# Patient Record
Sex: Male | Born: 1945 | Race: White | Hispanic: No | Marital: Married | Smoking: Former smoker
Health system: Southern US, Community
[De-identification: ages and names within clinical notes are randomized; demographics above are authoritative.]

## PROBLEM LIST (undated history)

## (undated) DIAGNOSIS — K649 Unspecified hemorrhoids: Secondary | ICD-10-CM

## (undated) DIAGNOSIS — K5790 Diverticulosis of intestine, part unspecified, without perforation or abscess without bleeding: Secondary | ICD-10-CM

## (undated) DIAGNOSIS — M7750 Other enthesopathy of unspecified foot: Secondary | ICD-10-CM

## (undated) DIAGNOSIS — Z955 Presence of coronary angioplasty implant and graft: Secondary | ICD-10-CM

## (undated) DIAGNOSIS — I519 Heart disease, unspecified: Secondary | ICD-10-CM

## (undated) DIAGNOSIS — N3289 Other specified disorders of bladder: Secondary | ICD-10-CM

## (undated) DIAGNOSIS — I10 Essential (primary) hypertension: Secondary | ICD-10-CM

## (undated) DIAGNOSIS — I38 Endocarditis, valve unspecified: Secondary | ICD-10-CM

## (undated) DIAGNOSIS — K219 Gastro-esophageal reflux disease without esophagitis: Secondary | ICD-10-CM

## (undated) DIAGNOSIS — E119 Type 2 diabetes mellitus without complications: Secondary | ICD-10-CM

## (undated) DIAGNOSIS — H269 Unspecified cataract: Secondary | ICD-10-CM

## (undated) DIAGNOSIS — K2970 Gastritis, unspecified, without bleeding: Secondary | ICD-10-CM

## (undated) DIAGNOSIS — I2510 Atherosclerotic heart disease of native coronary artery without angina pectoris: Secondary | ICD-10-CM

## (undated) DIAGNOSIS — N400 Enlarged prostate without lower urinary tract symptoms: Secondary | ICD-10-CM

## (undated) DIAGNOSIS — R519 Headache, unspecified: Secondary | ICD-10-CM

## (undated) DIAGNOSIS — F32A Depression, unspecified: Secondary | ICD-10-CM

## (undated) DIAGNOSIS — E559 Vitamin D deficiency, unspecified: Secondary | ICD-10-CM

## (undated) DIAGNOSIS — K625 Hemorrhage of anus and rectum: Secondary | ICD-10-CM

## (undated) DIAGNOSIS — F419 Anxiety disorder, unspecified: Secondary | ICD-10-CM

## (undated) DIAGNOSIS — G500 Trigeminal neuralgia: Secondary | ICD-10-CM

## (undated) DIAGNOSIS — F329 Major depressive disorder, single episode, unspecified: Secondary | ICD-10-CM

## (undated) DIAGNOSIS — E7800 Pure hypercholesterolemia, unspecified: Secondary | ICD-10-CM

## (undated) DIAGNOSIS — N329 Bladder disorder, unspecified: Secondary | ICD-10-CM

## (undated) DIAGNOSIS — I6529 Occlusion and stenosis of unspecified carotid artery: Secondary | ICD-10-CM

## (undated) DIAGNOSIS — I219 Acute myocardial infarction, unspecified: Secondary | ICD-10-CM

## (undated) DIAGNOSIS — R319 Hematuria, unspecified: Secondary | ICD-10-CM

## (undated) DIAGNOSIS — H9192 Unspecified hearing loss, left ear: Secondary | ICD-10-CM

## (undated) DIAGNOSIS — I252 Old myocardial infarction: Secondary | ICD-10-CM

## (undated) DIAGNOSIS — Z8719 Personal history of other diseases of the digestive system: Secondary | ICD-10-CM

## (undated) DIAGNOSIS — R51 Headache: Secondary | ICD-10-CM

## (undated) DIAGNOSIS — R0600 Dyspnea, unspecified: Secondary | ICD-10-CM

## (undated) DIAGNOSIS — C801 Malignant (primary) neoplasm, unspecified: Secondary | ICD-10-CM

## (undated) HISTORY — DX: Benign prostatic hyperplasia without lower urinary tract symptoms: N40.0

## (undated) HISTORY — DX: Old myocardial infarction: I25.2

## (undated) HISTORY — DX: Atherosclerotic heart disease of native coronary artery without angina pectoris: I25.10

## (undated) HISTORY — PX: EYE SURGERY: SHX253

## (undated) HISTORY — DX: Hemorrhage of anus and rectum: K62.5

## (undated) HISTORY — PX: CARDIAC CATHETERIZATION: SHX172

## (undated) HISTORY — DX: Unspecified cataract: H26.9

## (undated) HISTORY — DX: Heart disease, unspecified: I51.9

## (undated) HISTORY — PX: OTHER SURGICAL HISTORY: SHX169

## (undated) HISTORY — DX: Depression, unspecified: F32.A

## (undated) HISTORY — PX: HERNIA REPAIR: SHX51

## (undated) HISTORY — DX: Type 2 diabetes mellitus without complications: E11.9

## (undated) HISTORY — DX: Presence of coronary angioplasty implant and graft: Z95.5

## (undated) HISTORY — DX: Acute myocardial infarction, unspecified: I21.9

## (undated) HISTORY — DX: Gastro-esophageal reflux disease without esophagitis: K21.9

## (undated) HISTORY — PX: CATARACT EXTRACTION, BILATERAL: SHX1313

## (undated) HISTORY — DX: Pure hypercholesterolemia, unspecified: E78.00

## (undated) HISTORY — DX: Anxiety disorder, unspecified: F41.9

## (undated) HISTORY — DX: Hematuria, unspecified: R31.9

## (undated) HISTORY — PX: CORONARY ANGIOPLASTY: SHX604

## (undated) HISTORY — PX: UMBILICAL HERNIA REPAIR: SHX196

## (undated) HISTORY — DX: Essential (primary) hypertension: I10

## (undated) HISTORY — DX: Trigeminal neuralgia: G50.0

## (undated) HISTORY — PX: APPENDECTOMY: SHX54

## (undated) HISTORY — DX: Major depressive disorder, single episode, unspecified: F32.9

## (undated) HISTORY — DX: Occlusion and stenosis of unspecified carotid artery: I65.29

## (undated) HISTORY — DX: Bladder disorder, unspecified: N32.9

---

## 2004-07-07 ENCOUNTER — Other Ambulatory Visit: Payer: Self-pay

## 2004-07-07 ENCOUNTER — Emergency Department: Payer: Self-pay | Admitting: Emergency Medicine

## 2004-07-08 ENCOUNTER — Emergency Department: Payer: Self-pay | Admitting: Emergency Medicine

## 2004-08-11 ENCOUNTER — Encounter: Payer: Self-pay | Admitting: Internal Medicine

## 2004-08-29 ENCOUNTER — Encounter: Payer: Self-pay | Admitting: Internal Medicine

## 2004-09-29 ENCOUNTER — Encounter: Payer: Self-pay | Admitting: Internal Medicine

## 2004-12-21 ENCOUNTER — Ambulatory Visit: Payer: Self-pay | Admitting: Internal Medicine

## 2005-06-03 ENCOUNTER — Ambulatory Visit: Payer: Self-pay | Admitting: Surgery

## 2007-01-17 ENCOUNTER — Ambulatory Visit: Payer: Self-pay | Admitting: Family Medicine

## 2010-11-20 ENCOUNTER — Ambulatory Visit: Payer: Self-pay | Admitting: Podiatry

## 2010-11-30 DIAGNOSIS — E559 Vitamin D deficiency, unspecified: Secondary | ICD-10-CM | POA: Insufficient documentation

## 2011-04-21 ENCOUNTER — Ambulatory Visit: Payer: Self-pay | Admitting: Internal Medicine

## 2011-06-04 DIAGNOSIS — R5383 Other fatigue: Secondary | ICD-10-CM | POA: Insufficient documentation

## 2011-07-07 ENCOUNTER — Ambulatory Visit: Payer: Self-pay | Admitting: Gastroenterology

## 2011-11-16 DIAGNOSIS — I2510 Atherosclerotic heart disease of native coronary artery without angina pectoris: Secondary | ICD-10-CM | POA: Insufficient documentation

## 2011-11-16 HISTORY — DX: Atherosclerotic heart disease of native coronary artery without angina pectoris: I25.10

## 2012-03-09 ENCOUNTER — Ambulatory Visit: Payer: Self-pay | Admitting: Internal Medicine

## 2012-04-06 DIAGNOSIS — N5089 Other specified disorders of the male genital organs: Secondary | ICD-10-CM | POA: Insufficient documentation

## 2012-04-06 DIAGNOSIS — N509 Disorder of male genital organs, unspecified: Secondary | ICD-10-CM | POA: Insufficient documentation

## 2012-05-22 ENCOUNTER — Inpatient Hospital Stay: Payer: Self-pay | Admitting: Internal Medicine

## 2012-05-22 LAB — CBC
MCH: 25.2 pg — ABNORMAL LOW (ref 26.0–34.0)
MCV: 81 fL (ref 80–100)
Platelet: 218 10*3/uL (ref 150–440)
RBC: 5.47 10*6/uL (ref 4.40–5.90)
RDW: 14.9 % — ABNORMAL HIGH (ref 11.5–14.5)

## 2012-05-22 LAB — HEMOGLOBIN A1C: Hemoglobin A1C: 6.8 % — ABNORMAL HIGH (ref 4.2–6.3)

## 2012-05-22 LAB — CK TOTAL AND CKMB (NOT AT ARMC): CK, Total: 79 U/L (ref 35–232)

## 2012-05-22 LAB — BASIC METABOLIC PANEL
Anion Gap: 7 (ref 7–16)
Calcium, Total: 8.5 mg/dL (ref 8.5–10.1)
Co2: 24 mmol/L (ref 21–32)
Creatinine: 1.21 mg/dL (ref 0.60–1.30)
EGFR (African American): >60
EGFR (Non-African Amer.): >60
Glucose: 139 mg/dL — ABNORMAL HIGH (ref 65–99)
Osmolality: 276 (ref 275–301)
Potassium: 4.2 mmol/L (ref 3.5–5.1)

## 2012-05-22 LAB — TROPONIN I: Troponin-I: <0.02 ng/mL

## 2012-05-22 LAB — RAPID INFLUENZA A&B ANTIGENS

## 2012-05-23 LAB — COMPREHENSIVE METABOLIC PANEL
Albumin: 3.6 g/dL (ref 3.4–5.0)
Alkaline Phosphatase: 57 U/L (ref 50–136)
Anion Gap: 3 — ABNORMAL LOW (ref 7–16)
BUN: 17 mg/dL (ref 7–18)
Bilirubin,Total: 0.5 mg/dL (ref 0.2–1.0)
Calcium, Total: 8.1 mg/dL — ABNORMAL LOW (ref 8.5–10.1)
Chloride: 107 mmol/L (ref 98–107)
Co2: 28 mmol/L (ref 21–32)
EGFR (African American): >60
SGOT(AST): 21 U/L (ref 15–37)
Total Protein: 7.2 g/dL (ref 6.4–8.2)

## 2012-05-23 LAB — CBC WITH DIFFERENTIAL/PLATELET
Basophil #: 0 10*3/uL (ref 0.0–0.1)
Basophil %: 0.5 %
Eosinophil #: 0.2 10*3/uL (ref 0.0–0.7)
Eosinophil %: 2.6 %
HGB: 13.5 g/dL (ref 13.0–18.0)
Lymphocyte #: 1.2 10*3/uL (ref 1.0–3.6)
Lymphocyte %: 20.5 %
MCHC: 33.9 g/dL (ref 32.0–36.0)
MCV: 80 fL (ref 80–100)
Neutrophil #: 3.8 10*3/uL (ref 1.4–6.5)
Neutrophil %: 64 %
Platelet: 179 10*3/uL (ref 150–440)
RBC: 4.98 10*6/uL (ref 4.40–5.90)
WBC: 5.9 10*3/uL (ref 3.8–10.6)

## 2012-05-23 LAB — CK TOTAL AND CKMB (NOT AT ARMC)
CK, Total: 73 U/L (ref 35–232)
CK, Total: 78 U/L (ref 35–232)
CK-MB: 0.9 ng/mL (ref 0.5–3.6)

## 2012-05-25 LAB — CULTURE, BLOOD (SINGLE)

## 2012-08-07 DIAGNOSIS — G500 Trigeminal neuralgia: Secondary | ICD-10-CM

## 2012-08-07 HISTORY — DX: Trigeminal neuralgia: G50.0

## 2012-11-09 DIAGNOSIS — Z955 Presence of coronary angioplasty implant and graft: Secondary | ICD-10-CM | POA: Insufficient documentation

## 2012-11-09 DIAGNOSIS — I252 Old myocardial infarction: Secondary | ICD-10-CM

## 2012-11-09 DIAGNOSIS — Z9889 Other specified postprocedural states: Secondary | ICD-10-CM | POA: Insufficient documentation

## 2012-11-09 DIAGNOSIS — I10 Essential (primary) hypertension: Secondary | ICD-10-CM

## 2012-11-09 DIAGNOSIS — K219 Gastro-esophageal reflux disease without esophagitis: Secondary | ICD-10-CM | POA: Insufficient documentation

## 2012-11-09 HISTORY — DX: Essential (primary) hypertension: I10

## 2012-11-09 HISTORY — DX: Old myocardial infarction: I25.2

## 2012-11-09 HISTORY — DX: Presence of coronary angioplasty implant and graft: Z95.5

## 2012-12-12 DIAGNOSIS — F329 Major depressive disorder, single episode, unspecified: Secondary | ICD-10-CM | POA: Insufficient documentation

## 2012-12-12 DIAGNOSIS — E119 Type 2 diabetes mellitus without complications: Secondary | ICD-10-CM

## 2012-12-12 DIAGNOSIS — E782 Mixed hyperlipidemia: Secondary | ICD-10-CM | POA: Insufficient documentation

## 2012-12-12 HISTORY — DX: Type 2 diabetes mellitus without complications: E11.9

## 2013-03-01 HISTORY — PX: CORONARY STENT PLACEMENT: SHX1402

## 2013-05-16 DIAGNOSIS — H269 Unspecified cataract: Secondary | ICD-10-CM | POA: Insufficient documentation

## 2013-05-16 HISTORY — DX: Unspecified cataract: H26.9

## 2013-08-20 DIAGNOSIS — R079 Chest pain, unspecified: Secondary | ICD-10-CM | POA: Insufficient documentation

## 2013-08-22 ENCOUNTER — Ambulatory Visit: Payer: Self-pay | Admitting: Internal Medicine

## 2013-08-22 LAB — CK TOTAL AND CKMB (NOT AT ARMC)
CK, Total: 331 U/L — ABNORMAL HIGH
CK-MB: 31.8 ng/mL — AB (ref 0.5–3.6)

## 2013-08-23 LAB — BASIC METABOLIC PANEL
ANION GAP: 8 (ref 7–16)
BUN: 11 mg/dL (ref 7–18)
CALCIUM: 8.7 mg/dL (ref 8.5–10.1)
CO2: 25 mmol/L (ref 21–32)
CREATININE: 1.16 mg/dL (ref 0.60–1.30)
Chloride: 107 mmol/L (ref 98–107)
EGFR (African American): >60
Glucose: 111 mg/dL — ABNORMAL HIGH (ref 65–99)
Osmolality: 279 (ref 275–301)
Potassium: 4.1 mmol/L (ref 3.5–5.1)
Sodium: 140 mmol/L (ref 136–145)

## 2013-11-30 ENCOUNTER — Ambulatory Visit: Payer: Self-pay | Admitting: Urology

## 2013-12-30 HISTORY — PX: TRANSURETHRAL RESECTION OF BLADDER TUMOR WITH GYRUS (TURBT-GYRUS): SHX6458

## 2013-12-31 ENCOUNTER — Ambulatory Visit: Payer: Self-pay | Admitting: Urology

## 2013-12-31 LAB — PROTIME-INR
INR: 1
PROTHROMBIN TIME: 13.5 s (ref 11.5–14.7)

## 2013-12-31 LAB — URINALYSIS, COMPLETE
BACTERIA: NONE SEEN
BILIRUBIN, UR: NEGATIVE
Blood: NEGATIVE
Glucose,UR: NEGATIVE mg/dL (ref 0–75)
Ketone: NEGATIVE
Leukocyte Esterase: NEGATIVE
NITRITE: NEGATIVE
PROTEIN: NEGATIVE
Ph: 5 (ref 4.5–8.0)
RBC,UR: 1 /HPF (ref 0–5)
SPECIFIC GRAVITY: 1.018 (ref 1.003–1.030)
Squamous Epithelial: NONE SEEN
WBC UR: 1 /HPF (ref 0–5)

## 2013-12-31 LAB — CBC
HCT: 35 % — ABNORMAL LOW (ref 40.0–52.0)
HGB: 10.7 g/dL — ABNORMAL LOW (ref 13.0–18.0)
MCH: 21.4 pg — ABNORMAL LOW (ref 26.0–34.0)
MCHC: 30.5 g/dL — ABNORMAL LOW (ref 32.0–36.0)
MCV: 70 fL — ABNORMAL LOW (ref 80–100)
PLATELETS: 265 10*3/uL (ref 150–440)
RBC: 4.98 10*6/uL (ref 4.40–5.90)
RDW: 19.1 % — ABNORMAL HIGH (ref 11.5–14.5)
WBC: 6.6 10*3/uL (ref 3.8–10.6)

## 2013-12-31 LAB — APTT: Activated PTT: 26.4 secs (ref 23.6–35.9)

## 2014-01-01 LAB — URINE CULTURE

## 2014-01-02 ENCOUNTER — Ambulatory Visit: Payer: Self-pay | Admitting: Urology

## 2014-02-08 DIAGNOSIS — I6523 Occlusion and stenosis of bilateral carotid arteries: Secondary | ICD-10-CM | POA: Insufficient documentation

## 2014-02-08 DIAGNOSIS — I6529 Occlusion and stenosis of unspecified carotid artery: Secondary | ICD-10-CM | POA: Insufficient documentation

## 2014-02-08 HISTORY — DX: Occlusion and stenosis of unspecified carotid artery: I65.29

## 2014-02-12 ENCOUNTER — Ambulatory Visit: Payer: Self-pay | Admitting: Ophthalmology

## 2014-02-12 LAB — POTASSIUM: POTASSIUM: 4.4 mmol/L (ref 3.5–5.1)

## 2014-02-26 ENCOUNTER — Ambulatory Visit: Payer: Self-pay | Admitting: Ophthalmology

## 2014-05-02 ENCOUNTER — Ambulatory Visit: Payer: Self-pay | Admitting: Ophthalmology

## 2014-05-14 ENCOUNTER — Ambulatory Visit: Payer: Self-pay | Admitting: Ophthalmology

## 2014-06-11 DIAGNOSIS — I10 Essential (primary) hypertension: Secondary | ICD-10-CM

## 2014-06-11 HISTORY — DX: Essential (primary) hypertension: I10

## 2014-06-21 NOTE — Discharge Summary (Signed)
PATIENT NAME:  [name], [name] MR#:  [id number] DATE OF BIRTH:  [birth year]  DATE OF ADMISSION:  05/22/2012 DATE OF DISCHARGE:  05/24/2012  DISCHARGE DIAGNOSES: 1.  Chest pain secondary to coronary artery disease, resolved. 2.  Gastroenteritis with diarrhea and body aches, likely viral syndrome. 3.  Positive blood cultures in 1 of 2 bottles, likely contaminant.   ADDITIONAL DIAGNOSES: 1.  Hyperlipidemia. 2.  Borderline diabetes. 3.  Coronary artery disease with history of myocardial infarction and stenting x 2.  4.  Gastroesophageal reflux disease. 5.  Possible sleep apnea.   DISCHARGE MEDICATIONS: 1.  Aspirin 81 mg daily. 2.  Celexa 40 mg p.o. daily. 3.  Fish oil 1 capsule p.o. daily. 4.  Neurontin 600 mg 2 capsules in the morning and 1 capsule at night. 5.  Oxybutynin 50 mg p.o. daily.  6.  Plavix 75 mg p.o. daily.  7.  Pravastatin 40 mg daily.  ALLERGIES:  INTOLERANT TO BETA BLOCKERS, DEVELOPS BRADYCARDIA ON BETA BLOCKERS.   CONSULTANTS: Cardiology consult with Dr. Clayborn Bigness and ID consult with Dr. Clayborn Bigness.   DISCHARGE INSTRUCTIONS:  The patient will follow up with Dr. Valentino Saxon, primary doctor, and also follow up with Dr. Nehemiah Massed.  HOSPITAL COURSE:  This is a 69 year old male with history of hypertension, diabetes, hyperlipidemia and coronary artery disease with stenting x 2, follows up with Dr. Nehemiah Massed, who came in because of chest pressure. The patient had chest pressure on the 24th, in the morning.  No associated factors. The patient had an episode of diarrhea with generalized body aches and myalgias. That early morning went to see his primary doctor where he was found to have chest pressure so he was sent to the Emergency Room because of his coronary artery disease. The patient's flu test was negative in the ER. The patient was admitted for chest pain. His troponins were negative. The patient had a Lexiscan stress test done. EKG showed normal sinus rhythm with right bundle  branch and left anterior fascicular block. The patient had a Lexiscan stress test done which showed apical partially reversible defect consistent with ischemia and infarct in the territory typical of  distal LAD. The patient was taken to cardiac cath by Dr. Clayborn Bigness yesterday. He had an EF of 60% with 70% stenosis of proximal LAD and 100% stenosis of mid LAD. The patient does have collaterals. The patient's stent was intact.  Dr. Clayborn Bigness recommended medical management and follow up with Dr. Nehemiah Massed. The patient is discharged home on aspirin, Plavix and Losartan.  The patient initially was started on beta blocker per chest pain protocol, but he developed bradycardia with heart rate of 40s. The patient told me that he was started on Toprol initially and was taken off in the last 6 months because of bradycardia. So we stopped the beta blocker. The patient is on aspirin, Plavix and losartan. The patient was also seen by Dr. Clayborn Bigness because 1 set of blood cultures were positive. The patient had malaise and also diarrhea and fever at home. The patient did have blood cultures which showed only 1 of 2 sets positive for gram-positive cocci.  The patient received 1 gram of vancomycin, but Dr. Clayborn Bigness has seen the patient because he is planned for cardiac cath, so we wanted ID clearance. Dr. Clayborn Bigness has seen the patient, recommended it is likely a contaminant, no further antibiotics are indicated, and it is possible that he has a viral illness. No specific therapy is needed. The patient actually was doing better  so he suggested that the patient can go to cardiac cath. The patient did have a cardiac cath which was reportedly showing old defects, nothing new, and his stent was patent.  The patient is advised to follow up with Dr. Serafina Royals. The patient may need outpatient sleep study.  The patient's final blood cultures, reported on March 25th at 5:30, is negative in 36 hours.     TIME SPENT ON DISCHARGE PREPARATION:  More than 30 minutes. ____________________________ Epifanio Lesches, MD sk:sb D: 05/25/2012 12:05:30 ET   T: 05/25/2012 12:18:01 ET        JOB#: [id number] cc: Epifanio Lesches, MD, <Dictator> Maureen P. Heber Ekalaka, MD Corey Skains, MD Epifanio Lesches MD ELECTRONICALLY SIGNED 06/14/2012 10:10

## 2014-06-21 NOTE — Consult Note (Signed)
PATIENT NAME:  [name], [name] MR#:  [id number] DATE OF BIRTH:  1945/03/06  INFECTIONS DISEASES CONSULTATION REPORT  DATE OF CONSULTATION:  05/24/2012  REFERRING PHYSICIAN:  Dr. Clayborn Bigness.  CONSULTING PHYSICIAN:  Heinz Knuckles. Savas Elvin, MD  REASON FOR CONSULT: Coagulase-negative staph in the blood.   HISTORY OF PRESENT ILLNESS: The patient is a 69 year old white male with a past history significant for diabetes and coronary artery disease who was admitted on 05/23/2011 with chest pain. The patient states that the day before, he began having some generalized malaise, with  some subjective fevers and chills. He also had myalgias. He went to work, but then returned home without starting work because he was feeling ill. He had some chest pressure as well. He went to his primary care doctor and was transferred to the Emergency Room and was subsequently admitted. His cardiac enzymes have been negative. He has remained afebrile during this hospital course. His white count has been normal. His blood cultures, however, are growing coagulase-negative staph in 1 of 4 bottles. He denies any significant respiratory symptoms. He has had some nausea, but no vomiting. No significant abdominal pain. Some diarrhea. He has not had any urinary symptoms. He was given a dose of Tamiflu and then subsequently a dose of vancomycin x 1; neither of these have continued. Overall, he is feeling better. He did have some dizziness today when standing; however, this has happened in the past to him and is not a new symptom.   ALLERGIES: LYRICA.   PAST MEDICAL HISTORY: 1.  Diabetes.  2.  Hypertension.  3.  Hypercholesterolemia.  4.  Coronary artery disease, status post MI, status post stent placement.  5.  Status post appendectomy.  6.  Possible GERD.  7.  Possible COPD.  8.  Status post umbilical hernia repair.   SOCIAL HISTORY: The patient lives with his wife. He is a prior smoker but quit 25 years ago. He does not drink.    FAMILY HISTORY: Positive for diabetes, colon cancer, renal cancer and breast cancer.   REVIEW OF SYSTEMS:  GENERAL: Positive subjective fevers, and some chills and malaise.  HEENT: No headaches. No sinus congestion. No sore throat. Occasional postnasal drip and some allergy symptoms.  NECK: No stiffness. No swollen glands.  RESPIRATORY: No cough. No shortness of breath. No sputum production.  CARDIAC: Positive chest pressure. No peripheral edema. No dyspnea on exertion. No palpitations.  GASTROINTESTINAL: Some nausea. No vomiting, no abdominal pain. Some diarrhea.  GENITOURINARY: No urinary symptoms.  MUSCULOSKELETAL: He has myalgias, but no arthralgias. No arthritis. No joint swelling.  SKIN: No rashes.  NEUROLOGIC: He has had some dizziness, but no syncope. No focal weakness.  PSYCHIATRIC: No complaints.   All other systems are negative.   PHYSICAL EXAMINATION: VITAL SIGNS: T-max of 98.8; T-current 98.1. Pulse of 60, blood pressure 131/76; 95% on room air.  GENERAL: A 69 year old white male, obese but in no acute distress.  HEENT: Normocephalic, atraumatic. Pupils equal, reactive to light. Extraocular motion intact. Sclerae, conjunctivae, and lids are without evidence for emboli or petechiae. Oropharynx shows no erythema or exudate. Teeth and gums are in fair condition.  NECK: Supple. Full range of motion. Midline trachea. No lymphadenopathy. No thyromegaly.  CHEST: Clear to auscultation bilaterally with good air movement. No focal consolidation.  HEART: Regular rate and rhythm, without murmur, rub, or gallop.  ABDOMEN: Soft, nontender, and nondistended. No hepatosplenomegaly. No hernia is noted.  EXTREMITIES: No evidence for tenosynovitis.  SKIN EXAM: No rashes. No  stigmata of endocarditis, specifically, no Janeway lesions or Osler nodes.  NEUROLOGIC: The patient is awake and interactive, moving all four extremities.  PSYCHIATRIC: Mood and affect appeared normal.     LABORATORY  DATA: BUN 17, creatinine 1.18, bicarbonate 28, anion gap of 3. LFTs are unremarkable. CPKs, MBs, and troponins are all normal.   White count of 5.9, hemoglobin 13.5, platelet count of 179, ANC of 3.8. White count on admission was 10.2.   Blood cultures are growing coagulase-negative staph in 1 set of 2 cultures. Rapid influenza testing was negative.   EKG was read as normal.   Chest x-ray from admission shows no acute infiltrates.   KUB and upright x-rays showed no evidence for intraperitoneal air.   Myocardial scan showed mild to moderately-severe apical partially-reversible defect, consistent with ischemia and infarction of the distal LAD.   IMPRESSION: A 69 year old male with a history of diabetes and coronary artery disease admitted with chest pains, malaise, and subjective fever, who had coagulase-negative staph in the blood.   RECOMMENDATIONS: 1.  He had some subjective fever, malaise, and myalgias with some diarrhea prior to admission. He has been afebrile in-house. His white count has been normal. Blood cultures were obtained on admission and are growing coagulase-negative staph in 1 of 2 sets.  2.  The coagulase-negative staph is likely a contaminant. No further antibiotics are indicated. It is possible that he has a viral illness. No specific therapy is needed. Rapid influenza testing is negative. He appears to be doing better today.  3.  It is okay to go to cardiac catheterization today from an infectious disease standpoint. This is a low-level infectious disease consult.   Thank you very much for involving me in the patient's care.       ____________________________ Heinz Knuckles. Derionna Salvador, MD meb:dm D: 05/24/2012 13:17:59 ET T: 05/24/2012 14:04:47 ET JOB#: [id number]  cc: Heinz Knuckles. Caydon Feasel, MD, <Dictator> Mady Oubre E Nysha Koplin MD ELECTRONICALLY SIGNED 05/25/2012 9:44

## 2014-06-21 NOTE — Consult Note (Signed)
Impression:     69 yo  male w/ h/o DM, CAD admitted with chest pain, malaise and subjective fever and who has CNS in blood.    He had some subjective fever, malaise and myalgias with some diarrhea prior to admission.  He has been afebrile in house and his WBC has been normal.  BCx were obtained on admission and are growing 1 of 2 sets 4 bottles positive for CNS.    The CNS is likely a contaminant.  No further antibiotics are indicated.   It is possible that he has a viral illness.  No specific therapy is needed.  Rapid Influenza testing is negative.  He appears to be doing better today.   OK to go for cardiac cath today.   Electronic Signatures: Lenard Kampf MPH, Heinz Knuckles (MD) (Signed on 26-Mar-14 13:09)  Authored   Last Updated: 26-Mar-14 13:17 by Suzana Sohail MPH, Heinz Knuckles (MD)

## 2014-06-21 NOTE — Consult Note (Signed)
PATIENT NAME:  [name], [name] MR#:  [id number] DATE OF BIRTH:  09/28/1945  CARDIOLOGY CONSULTATION REPORT  DATE OF CONSULTATION:  05/24/2012  PRIMARY PHYSICIAN:  Dr. Heber El Dara. He has seen Dr. Nehemiah Massed in the past.  INDICATION: Angina.    Referred by [facility], Dr. Ether Griffins.  HISTORY OF PRESENT ILLNESS: The patient is a 69 year old white male with a significant medical history of borderline diabetes, hypertension, obesity, hyperlipidemia, coronary artery disease status post myocardial infarctions x 2. He had a stent placement about a year ago at [facility]. He was on vacation and started having significant chest pain. Was found to have a STEMI with a total occlusion of the right. He had that area fixed with stent placement. The patient had a drug-eluting stent placed in the right coronary artery, a 4.0 x 24 Promus. He has done reasonably well, but started having significant chest discomfort, 8/10, with no radiation, no alleviating or aggravating factors. It lasted about 1 to 2 hours and then eventually resolved, as well as associated with shortness of breath and lightheadedness and dizziness. He got presyncopal in the primary physician's office, and from there he was sent to the Emergency Room. Overall the patient feels weak, tired. According to his wife he looked pale and tired and felt like he was giving out, so he was admitted for further evaluation and care. Enzymes were essentially negative. He underwent a functional study which showed anterior apical ischemia with preserved LV function, so a cardiology consultation was recommended.   REVIEW OF SYSTEMS: He has had presyncope. No blackout spells. He has had mild  nausea. Or vomiting. Denies fever, chills, sweats. No weight loss. No weight gain. No hemoptysis, hematemesis. Denies bright red blood per rectum   Denies sputum production or cough.   PAST MEDICAL HISTORY: Hypertension, hyperlipidemia, borderline diabetes, coronary artery disease,  myocardial infarction, obesity, reflux, dysphagia, possible obstructive sleep apnea, hemorrhoids, polyps.   PAST SURGICAL HISTORY: Angioplasty and stenting, colonoscopy, EGD, hernia repair, umbilical hernia repair.   FAMILY HISTORY: Diabetes, colon cancer, kidney cancer, breast cancer.   SOCIAL HISTORY: Married. He used to smoke but quit 25 years ago. Still works.   MEDICATIONS: The patient was on aspirin 81 mg a day, citalopram 40 day. fish oil, gabapentin 600 mg, 2 tablets in the morning, one capsule in the evening, oxybutynin 50 mg daily, Plavix 75 a day.   PHYSICAL EXAMINATION: VITAL SIGNS: Blood pressure 140/75, pulse of 80, respiratory rate of 16, afebrile.  HEENT: Normocephalic, atraumatic. Pupils equal, reactive to light.  NECK: Supple. No significant JVD, bruits or adenopathy.  LUNGS: Clear to auscultation and percussion. No significant wheeze, rhonchi, or rales.  HEART: Regular rate and rhythm. Positive bowel sounds. No rebound, guarding, or tenderness.  EXTREMITIES: Within normal limits.  NEUROLOGIC: Exam intact.  SKIN: Normal.   LABORATORIES: BMP: Glucose is 139. Cardiac enzymes are negative. White count of 10.2, hemoglobin of 13.8, platelet count of 218. Flu test was negative.   EKG: Normal sinus rhythm, 85, right bundle branch block, intraventricular conduction delay, nonspecific ST-T wave changes, with slight ST depression in V2 and V3; otherwise unremarkable.   CHEST X-Ray: Unremarkable.   ASSESSMENT: Angina, abnormal functional study, coronary artery disease, borderline diabetes, obstructive sleep apnea, hypertension, hyperlipidemia, history of angioplasty and stent, history of myocardial infarction.   PLAN: 1.  Proceed with cardiac cath because of abnormal functional study and chest pain. From his previous cath will try to obtain records from [facility], but from what we can tell  he had a stent placed in his right coronary artery and had significant disease in his  LAD, which was treated medically. Plan is to repeat cath to be sure that he does not have any significant disease and base further evaluation on the cath and medical therapy.  2.  He has had positive blood cultures, 1 out of 2 coag-negative staph, probably a contaminant. Will consult infectious disease to be sure that this does not represent a significant problem prior to cath. We have to be sure that he is not septic before we try proceed with invasive procedure. I suspect it will be deemed a contaminant. Will proceed with cath today and hopefully treat the patient, and if no intervention is necessary have him discharged home soon.  3.  He is now on Nystatin. It is not clear to me why. He is on fish oil. I tried to discuss further with the patient. Unless he has an allergy will probably consider adding a statin as well. The fish oil can be continued without any problems. 4.  Hypertension: Right now he is basically on no real medications. I would recommend probably a beta blocker and/or an ACE inhibitor for post-myocardial event and hypertensive control.  5.  I agree with continuing aspirin and Plavix for now since he had a drug-eluting stent.  6.  If his chest pain continues to suggest angina we will consider nitrates, long-term. 7.  Diabetes: Will probably check a hemoglobin A1c, place on diabetic diet. He may or may not need therapy.   Will base the results on studies we get for myalgias. It is not clear whether he has myalgias. He may have been on Pravachol at one time, and that may be held for now. It is not listed in his home medicines, but the patient thinks he has been on Pravachol. Will try to figure it out with pharmacy again, but in the interim if okay with infectious disease proceed with cardiac catheterization to definitively evaluate his coronary anatomy and base further evaluation on the results of his studies.      ____________________________ Loran Senters Clayborn Bigness,  MD ddc:dm D: 05/24/2012 11:09:00 ET T: 05/24/2012 11:50:06 ET JOB#: [id number]  cc: Dwayne D. Clayborn Bigness, MD, <Dictator> Yolonda Kida MD ELECTRONICALLY SIGNED 06/12/2012 7:13

## 2014-06-21 NOTE — H&P (Signed)
DATE OF BIRTH:  27-Feb-1946  DATE OF ADMISSION:  05/22/2012  PRIMARY CARE PHYSICIAN:  Dr. Heber Volga   CARDIOLOGIST:  Dr. Nehemiah Massed   HISTORY OF PRESENT ILLNESS:  The patient is a 69 year old Caucasian male with past medical history significant for history of borderline diabetes mellitus, hypertension as well as hyperlipidemia, history of coronary artery disease, status post 2 MIs, according to patient, and stent placement approximately 1 year ago, presented to the hospital with complaints of chest pains. According to patient, he was doing well up until today in the morning, when he got up and he felt somewhat feverish and chilly, felt really poorly, felt that all his muscles were aching. He also was having some pressure in his chest. The pressure was described as discomfort in the chest, 8 out of 10 in intensity, with no radiation, with no alleviating or aggravating factors. It lasted 2 hours and resolved. It was associated with some shortness of breath as well as lightheadedness and dizziness and feeling presyncopal in his primary physician's office, from where he was sent to the Emergency Room for further evaluation. Overall, the past week he has been feeling weak, according to patient's wife, he also looked white, very tired, as well as giving out, and now he is presenting with some discomfort in his chest. Hospitalist services were contacted for admission.   PAST MEDICAL HISTORY: Significant for history of hypertension, hyperlipidemia, borderline diabetes mellitus, coronary artery disease, status post MI. First one was approximately 5 or 6 years ago, and the second one approximately a year ago, when he had stent placed. History of appendectomy as well as trigeminal neuralgia. Also history of dysphagia, questionable gastroesophageal reflux disease, questionable obstructive sleep apnea. He had a sleep study approximately 30 years ago; at that time, it was negative. He had a colonoscopy as well as EGD  done in 2013, where he was noted to have hemorrhoids, and it was felt that his rectal bleeding was hemorrhoidal-related. He also has a history of colon polyps as well as umbilical hernia repair.   MEDICATIONS: According to medical records, the patient is on aspirin 81 mg p.o. daily, citalopram 40 mg p.o. daily, fish oil 1 capsule once daily, gabapentin 600 mg tablets 2 capsules in the morning and 1 capsule in the evening, oxybutynin extended-release 50 mg p.o. daily and Plavix 75 mg p.o. daily.    SURGERIES:  As above.  ALLERGIES:  None.   FAMILY HISTORY:  The patient's brother had diabetes. The patient's father had colon cancer. The patient's mother died of kidney cancer. The patient's grandmother had breast cancer.   SOCIAL HISTORY: The patient is married. He used to smoke for a long period of time; however, quit 25 years ago. No alcohol abuse.   REVIEW OF SYSTEMS: Positive for feeling feverish and chilly over the past 1 day. He has been feeling fatigued and weak for the past 1 week, having also chest pains. Also has history of cataracts. Also sinus congestion and runny nose today in the morning. He was suspected to have obstructive sleep apnea; approximately 30 years ago he had a sleep study, which was negative. Also shortness of breath intermittently, and cough with some brownish-green sputum production for the past 2 or 3 days. One-pillow orthopnea. Lower extremity edema intermittently in his ankles. Feeling presyncopal in his primary care physician's office. Also intermittent laryngitis, hoarseness and nausea today in the morning, as well as 1 episode of diarrheal stool, loose stool with some blood early in the morning.  No recurrences. No abdominal pains. He does have some discomfort in his abdomen, in his right lower quadrant, for the past 1 week. He has a history of constipation. Otherwise, denies any high fevers, weight loss or gain. EYES: Denies any blurry vision or double vision, glaucoma.   EARS, NOSE, THROAT:  Denies tinnitus, allergies, nasal bleeding, snorring  RESPIRATORY:  Denies any wheezes, asthma, COPD.  CARDIOVASCULAR: As mentioned above, he was complaining of some chest pressure. No arrhythmias or palpitations.  GASTROINTESTINAL:  Denies any vomiting, hematemesis, abdominal pain,   change in bowel habits.  GENITOURINARY: Denies any dysuria, hematuria,  incontinence.  ENDOCRINE: Denies any polydipsia, nocturia, thyroid problems, heat or cold intolerance, or thirst.  HEMATOLOGIC:  no bleeding, lymph node enlargement   SKIN:  Denies any acne, rashes, lesions or moles.  MUSCULOSKELETAL:  Denies arthritis, cramps, swelling. NEUROLOGIC:   No numbness, epilepsy or tremor.  PSYCHIATRIC:  Denies anxiety, insomnia.    PHYSICAL EXAMINATION:  VITAL SIGNS:  On arrival to the hospital, temperature was 98.4, pulse 84, respirations 18, blood pressure 138/74, saturation 94% on room air. GENERAL:  This is a well-developed, well-nourished, obese Caucasian male in no significant distress, comfortable on the stretcher.  HEENT:  His pupils are equal and reactive to light. Extraocular movements intact. No icterus or conjunctivitis. Has normal hearing. The patient's mucosa is moist.  NECK: No masses. Supple, nontender. Thyroid is not enlarged. No adenopathy. No JVD or carotid bruits .  LUNGS:  Clear to auscultation in all fields. No rales, rhonchi, wheezing No labored inspiration, increased effort, dullness to percussion or overt respiratory distress.  CARDIOVASCULAR: S1, S2 appreciated. No murmurs, galops or rubs  The patient's  rhythm was regular.  CHEST:  Nontender to palpation. EXTREMITIES: Have 1+ pedal pulses. No lower extremity edema, calf tenderness, or cyanosis  ABDOMEN:  Protuberant, moderately firm. Minimally tender in right as well as left lower quadrants. No rebound or guarding was noted. No  hepatosplenomegaly or masses were noted.  RECTAL:  Deferred.  MUSCULOSKELETAL:  Able to  move extremities. No signs of degenerative joint disease  or joint swelling, calf tenderness, cyanosis .  SKIN:  Did not reveal any rashes, lesions, erythema, nodularity or induration. It was warm and dry to palpation.  LYMPH:  No adenopathy in the cervical region.  NEUROLOGICAL: Cranial nerves grossly intact. Sensory is intact. No dysarthria or aphasia. The patient is alert and oriented to time, person and place. Cooperative. Memory is good. No significant confusion, agitation or depression noted.   LABS: BMP showed glucose of 139, otherwise unremarkable. Cardiac enzymes, first set negative. CBC: White blood cell count 10.2, hemoglobin 13.8, platelet count 218. Influenza test  is negative. EKG showed normal sinus rhythm at 85 beats per minute, normal axis, right bundle branch block, left anterior fascicular block, with ST depressions in V2 and V3. No acute ST-T changes were noted. No EKG to compare with.    RADIOLOGIC STUDIES:  Chest x-ray PA and lateral on the 24th of March 2014 revealed no acute cardiopulmonary disease.   ASSESSMENT AND PLAN: 1.  Chest pressure. Admit patient to medical floor. Start him on metoprolol and aspirin as well as nitroglycerin and heparin subcu. Check his cardiac enzymes x3. Get Myoview stress test in the morning.   2. Myalgias of unclear etiology. For now, at this point, I am concerned about possible Pravachol. However, the patient does not have this medication listed on his home medication list, but he tells me that he was  using Pravachol, so we will hold that Pravachol at this point.   3.  Diabetes mellitus. Will get hemoglobin A1c. Will continue him on 1800 ADA diet as well as sliding scale insulin.   4.  Suspected obstructive sleep apnea. The patient may need to have his sleep study repeated, as I am concerned that his tiredness and weakness may be related to obstructive sleep apnea.   5.  Hypertension. Will continue metoprolol as well as nitroglycerin while in  the hospital. His blood pressure was quite well controlled on admission.   6.  Hyperlipidemia. Will continue fish oil. Will hold statins.   TIME SPENT: 1 hour.    ____________________________ Theodoro Grist, MD rv:mr D: 05/22/2012 19:19:49 ET T: 05/22/2012 20:26:08 ET JOB#: [id number]  cc: Theodoro Grist, MD, <Dictator> Maureen P. Heber Mascotte, MD   Theodoro Grist MD ELECTRONICALLY SIGNED 06/01/2012 15:06

## 2014-06-22 NOTE — Discharge Summary (Signed)
PATIENT NAME:  [name], [name] MR#:  [id number] DATE OF BIRTH:  07/30/1945  DATE OF ADMISSION:  08/22/2013 DATE OF DISCHARGE:  08/23/2013  DISCHARGE DIAGNOSES: Angina with coronary artery disease, hypertension and hyperlipidemia.  HISTORY OF PRESENT ILLNESS: This is a 69 year old male with known coronary artery disease status post previous PCI and stent placement and myocardial infarction who has had progressive symptoms of shortness of breath, weakness and fatigue, and stable angina, [place] class III in nature, with an abnormal stress test consistent with moderate intensity and moderate reversible inferior and apical myocardial perfusion defect with high risk stress test and typical angina at peak stress on appropriate medication management including nitrates for which were not helping improve his symptoms. The patient had undergone a cardiac catheterization showing normal LV systolic function, ejection fraction of 55%, with an occluded LAD with collateralization seen in the past and a patent right coronary artery stent and a significant 90% stenosis of a mid right coronary artery lesion needing further treatment options. The patient underwent drug-eluting stent placement of the right coronary artery through the proximal and mid section without complication with some high radiation exposure due to long procedure for which the patient has had no complication at this time. The patient has been placed on appropriate medication management and is ambulating without significant symptoms and will be discharged home in good condition.   DISCHARGE MEDICATIONS: Medication management includes ticagrelor 90 mg twice per day, aspirin 81 mg each day, citalopram 40 mg each day, losartan 25 mg each day, oxybutynin 15 mg each day and pravastatin 40 mg each day.   DISCHARGE FOLLOWUP: He is to follow up in 3 to 5 days and have further evaluation and treatment options thereafter.   ____________________________ Corey Skains, MD bjk:sb D: 08/23/2013 07:35:44 ET T: 08/23/2013 10:35:04 ET JOB#: [id number]  cc: Corey Skains, MD, <Dictator> Corey Skains MD ELECTRONICALLY SIGNED 09/03/2013 13:21

## 2014-06-22 NOTE — Op Note (Signed)
PATIENT NAME:  [name], [name] MR#:  [id number] DATE OF BIRTH:  Jun 15, 1945  DATE OF PROCEDURE:  01/02/2014  PREOPERATIVE DIAGNOSIS: Bladder lesion.   POSTOPERATIVE DIAGNOSIS: Bladder lesion.  PROCEDURE PERFORMED: Cystoscopy, bladder biopsy, fulguration of lesion.   ATTENDING SURGEON: Sherlynn Stalls, MD.   ANESTHESIA: General.  ESTIMATED BLOOD LOSS: Minimal.   DRAINS: None.    COMPLICATIONS: None.   SPECIMENS: Bladder biopsy.   INDICATION: This is a 69 year old male with a history of smoking and hematuria who underwent office cystoscopy, found to have a small bladder lesion just proximal to the left ureteral orifice on the bladder neck. The lesion appeared somewhat suspicious. Therefore, he was counseled to undergo a bladder biopsy. Of note, he recently had an MI and cardiac stent placed and is on anticoagulation. Given the very small size of the lesion, I feel that this is amenable for biopsy on anticoagulation. Increased risk of bleeding was discussed extensively with the patient who agreed to proceed as planned.   PROCEDURE: The patient was correctly identified in the preoperative holding area and informed consent was obtained. He was brought to the operating suite and placed on the table in the supine position. At this time, universal timeout protocol was performed. All team members were identified. Venodyne boots were placed and he was administered IV Levaquin in the perioperative period. He was then placed under general anesthesia and repositioned lower on the bed in the dorsal lithotomy position. He was prepped and draped in standard surgical fashion. At this point in time, a rigid cystoscope was then advanced per urethra into the bladder. Of note, there was a very mild concentric bulbar urethral stricture noted by which the 22 [place] scope was able to easily pass. Upon entry into the bladder, the entire bladder was surveilled using both 30 degree and 70 degree lenses. This revealed no  additional lesions in the bladder other than the previously described lesion between the left bladder neck and left UO. It appeared very small, approximately 5 mm in size. It is unclear whether this represented a papillary bladder tumor versus some bullous edema. At this point in time, cold cup biopsy forceps were used to grasp this lesion and perform a deep biopsy. This was passed off as bladder lesion specimen. The base of the lesion was then carefully fulgurated using Bugbee electrocautery. There was minimal bleeding noted. Care was taken to avoid any injury to the UO as this was somewhat adjacent to the left ureteral orifice. There was efflux of clear urine from the UO prior to the end of the procedure. The bladder was then drained and the scope was removed. The patient was reversed from anesthesia and taken to the PACU in stable condition. There were no complications in this case.    ____________________________ Sherlynn Stalls, MD ajb:at D: 01/02/2014 17:27:10 ET T: 01/02/2014 18:51:47 ET JOB#: [id number]  cc: Sherlynn Stalls, MD, <Dictator> Sherlynn Stalls MD ELECTRONICALLY SIGNED 01/17/2014 15:18

## 2014-06-24 LAB — SURGICAL PATHOLOGY

## 2014-06-26 NOTE — Op Note (Signed)
PATIENT NAME:  [name], [name] MR#:  [id number] DATE OF BIRTH:  September 03, 1945  DATE OF PROCEDURE:  02/26/2014  LOCATION: [facility]  PREOPERATIVE DIAGNOSIS:  Nuclear sclerotic cataract of the left eye.   POSTOPERATIVE DIAGNOSIS:  Nuclear sclerotic cataract of the left eye.   OPERATIVE PROCEDURE:  Cataract extraction by phacoemulsification with implant of intraocular lens to the left eye.   SURGEON:  Birder Robson, MD  ANESTHESIA:  1. Managed anesthesia care.  2. Topical tetracaine drops followed by 2% Xylocaine jelly applied in the preoperative holding area.   COMPLICATIONS:  None.   TECHNIQUE:   Stop and chop.  DESCRIPTION OF PROCEDURE:  The patient was examined and consented in the preoperative holding area where the aforementioned topical anesthesia was applied to the left eye and then brought back to the Operating Room where the left eye was prepped and draped in the usual sterile ophthalmic fashion and a lid speculum was placed. A paracentesis was created with the side port blade and the anterior chamber was filled with viscoelastic. A near clear corneal incision was performed with the steel keratome. A continuous curvilinear capsulorrhexis was performed with a cystotome followed by the capsulorrhexis forceps. Hydrodissection and hydrodelineation were carried out with BSS on a blunt cannula. The lens was removed in a stop-and-chop technique and the remaining cortical material was removed with the irrigation-aspiration handpiece. The capsular bag was inflated with viscoelastic and the Tecnis ZCB00, 18.5-diopter lens, serial number [id number] was placed in the capsular bag without complication.   The remaining viscoelastic was removed from the eye with the irrigation-aspiration handpiece. The wounds were hydrated. The anterior chamber was flushed with Miostat and the eye was inflated to physiologic pressure. 0.1 mL of cefuroxime concentration 10 mg/mL was placed in the anterior chamber. The wounds  were found to be water tight. The eye was dressed with Vigamox.   The patient was given protective glasses to wear throughout the day and a shield with which to sleep tonight. The patient was also given drops with which to begin a drop regimen today and will follow-up with me in 1 day.    ____________________________ Livingston Diones. Demarkus Remmel, MD wlp:MT D: 02/26/2014 13:53:53 ET T: 02/26/2014 14:44:03 ET JOB#: [id number]  cc: Vinton Layson L. Teron Blais, MD, <Dictator> Livingston Diones Sharnell Knight MD ELECTRONICALLY SIGNED 03/04/2014 9:19

## 2014-06-30 NOTE — Op Note (Signed)
PATIENT NAME:  [name], [name] MR#:  [id number] DATE OF BIRTH:  October 12, 1945  DATE OF PROCEDURE:  05/14/2014  PREOPERATIVE DIAGNOSIS:  Nuclear sclerotic cataract of the right eye.   POSTOPERATIVE DIAGNOSIS:  Nuclear sclerotic cataract of the right eye.   OPERATIVE PROCEDURE:  Cataract extraction by phacoemulsification with implant of intraocular lens to right eye.   SURGEON:  Birder Robson, MD.   ANESTHESIA:  1. Managed anesthesia care.  2. Topical tetracaine drops followed by 2% Xylocaine jelly applied in the preoperative holding area.   COMPLICATIONS:  None.   TECHNIQUE:   Stop and chop.  DESCRIPTION OF PROCEDURE:  The patient was examined and consented in the preoperative holding area where the aforementioned topical anesthesia was applied to the right eye and then brought back to the operating room where the right eye was prepped and draped in the usual sterile ophthalmic fashion and a lid speculum was placed. A paracentesis was created with the side port blade and the anterior chamber was filled with viscoelastic. A near clear corneal incision was performed with the steel keratome. A continuous curvilinear capsulorrhexis was performed with a cystotome followed by the capsulorrhexis forceps. Hydrodissection and hydrodelineation were carried out with BSS on a blunt cannula. The lens was removed in a stop and chop technique and the remaining cortical material was removed with the irrigation-aspiration handpiece. The capsular bag was inflated with viscoelastic and the Tecnis ZCB00 18.5-diopter lens, serial number [id number] was placed in the capsular bag without complication. The remaining viscoelastic was removed from the eye with the irrigation-aspiration handpiece. The wounds were hydrated. The anterior chamber was flushed with Miostat and the eye was inflated to physiologic pressure. 0.1 mL of cefuroxime concentration 10 mg/mL was placed in the anterior chamber. The wounds were found to be water  tight. The eye was dressed with Vigamox. The patient was given protective glasses to wear throughout the day and a shield with which to sleep tonight. The patient was also given drops with which to begin a drop regimen today and will follow-up with me in one day.    ____________________________ Livingston Diones. Jazalynn Mireles, MD wlp:bm D: 05/14/2014 21:44:25 ET T: 05/15/2014 06:16:11 ET JOB#: [id number]  cc: Arnetra Terris L. Ibtisam Benge, MD, <Dictator> Livingston Diones Madalina Rosman MD ELECTRONICALLY SIGNED 05/15/2014 11:00

## 2014-07-31 ENCOUNTER — Ambulatory Visit (INDEPENDENT_AMBULATORY_CARE_PROVIDER_SITE_OTHER): Payer: PPO | Admitting: Urology

## 2014-07-31 ENCOUNTER — Encounter: Payer: Self-pay | Admitting: Urology

## 2014-07-31 VITALS — BP 150/79 | HR 60 | Ht 67.0 in | Wt 205.3 lb

## 2014-07-31 DIAGNOSIS — N400 Enlarged prostate without lower urinary tract symptoms: Secondary | ICD-10-CM

## 2014-07-31 DIAGNOSIS — Z8551 Personal history of malignant neoplasm of bladder: Secondary | ICD-10-CM | POA: Diagnosis not present

## 2014-07-31 DIAGNOSIS — R310 Gross hematuria: Secondary | ICD-10-CM

## 2014-07-31 HISTORY — DX: Benign prostatic hyperplasia without lower urinary tract symptoms: N40.0

## 2014-07-31 LAB — MICROSCOPIC EXAMINATION

## 2014-07-31 MED ORDER — CIPROFLOXACIN HCL 500 MG PO TABS: 500.0000 mg | ORAL_TABLET | Freq: Once | ORAL | Status: DC

## 2014-07-31 MED ORDER — LIDOCAINE HCL 2 % EX GEL: 1.0000 "application " | Freq: Once | CUTANEOUS | Status: DC

## 2014-07-31 NOTE — Progress Notes (Signed)
07/31/2014 4:26 PM   [name] [birth year] [id number]  Referring provider: Ricardo Jericho, NP [address] [place], [place] [address]  Chief Complaint  Patient presents with  . Procedure    cystoscopy  . Bladder Cancer    HPI: 69 yo M with history of painless gross hematuria s/p hematuria work up revealing a very small papillary bladder tumor just proximal to the LEFT UO. 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 He returns today for his routine 3 month surveillance cystoscopy.  No significant voiding symptoms. Denies UTIs or kidney stones.   He does have a significant history of CAD s/p recent PCI/ stent 3 months ago. He is currently on Brilinta and ASA.  CT Urogram from 11/30/13 shows no evidence of GU pathology.   PMH: Past Medical History  Diagnosis Date  . Diabetes   . Hypercholesteremia   . Hematuria   . Anxiety   . Depression   . Myocardial infarct   . Heart disease   . Rectal bleeding   . GERD (gastroesophageal reflux disease)   . Benign enlargement of prostate   . Lesion of bladder   . H/O heart artery stent     Surgical History: Past Surgical History  Procedure Laterality Date  . Cardiac catheterization    . Appendectomy    . Coronary stent placement  2015    x5    Home Medications:    Medication List       This list is accurate as of: 07/31/14  4:26 PM.  Always use your most recent med list.               aspirin 81 MG tablet  Take 81 mg by mouth daily.     citalopram 40 MG tablet  Commonly known as:  CELEXA  Take 40 mg by mouth daily.     isosorbide mononitrate 30 MG 24 hr tablet  Commonly known as:  IMDUR  Take 30 mg by mouth daily.     losartan 25 MG tablet  Commonly known as:  COZAAR  Take 25 mg by mouth daily.     metFORMIN 500 MG tablet  Commonly known as:  GLUCOPHAGE  Take 500 mg by mouth 2 (two) times daily with a  meal.     nitroGLYCERIN 0.4 MG SL tablet  Commonly known as:  NITROSTAT  Place 0.4 mg under the tongue every 5 (five) minutes as needed for chest pain.     oxybutynin 15 MG 24 hr tablet  Commonly known as:  DITROPAN XL  Take 15 mg by mouth at bedtime.     pantoprazole 40 MG tablet  Commonly known as:  PROTONIX  Take 40 mg by mouth daily.     pravastatin 40 MG tablet  Commonly known as:  PRAVACHOL  Take 40 mg by mouth daily.     ticagrelor 90 MG Tabs tablet  Commonly known as:  BRILINTA  Take 90 mg by mouth 2 (two) times daily.        Allergies:  Allergies  Allergen Reactions  . Ace Inhibitors Cough  . Lyrica [Pregabalin] Rash    Family History: Family History  Problem Relation Age of Onset  . Kidney cancer Mother     Social History:  reports that he quit smoking about 25 years ago. He does not have any smokeless tobacco history on file. He reports  that he does not drink alcohol or use illicit drugs.  Physical Exam: Filed Vitals:   07/31/14 1601  BP: 150/79  Pulse: 60    Constitutional:  Alert and oriented, No acute distress. HEENT: [place] AT, moist mucus membranes.  Trachea midline, no masses. Cardiovascular: No clubbing, cyanosis, or edema. Respiratory: Normal respiratory effort, no increased work of breathing. GI: Abdomen is soft, nontender, nondistended, no abdominal masses GU: No CVA tenderness. Circumcised phallus, scrotum unremarkable. Skin: No rashes, bruises or suspicious lesions. Neurologic: Grossly intact, no focal deficits, moving all 4 extremities. Psychiatric: Normal mood and affect.  Laboratory Data: Lab Results  Component Value Date   WBC 6.6 12/31/2013   HGB 10.7* 12/31/2013   HCT 35.0* 12/31/2013   MCV 70* 12/31/2013   PLT 265 12/31/2013    Lab Results  Component Value Date   CREATININE 1.16 08/23/2013    No results found for: PSA  No results found for: TESTOSTERONE  No results found for: HGBA1C  Urinalysis No results found  for: COLORURINE, APPEARANCEUR, LABSPEC, [place], GLUCOSEU, HGBUR, BILIRUBINUR, KETONESUR, PROTEINUR, UROBILINOGEN, NITRITE, LEUKOCYTESUR    Cystoscopy Procedure Note  Patient identification was confirmed, informed consent was obtained, and patient was prepped using Betadine solution.  Lidocaine jelly was administered per urethral meatus.    Preoperative abx where received prior to procedure.     Pre-Procedure: - Inspection reveals a normal caliber ureteral meatus.  Procedure: The flexible cystoscope was introduced without difficulty - No urethral strictures/lesions are present. - Enlarged prostate with bilobar coaptation - Normal bladder neck - Bilateral ureteral orifices identified - Bladder mucosa  reveals no ulcers, tumors, or lesions - No bladder stones - Mild/ moderate trabeculation  Retroflexion shows no remarkable median lobe.   Post-Procedure: - Patient tolerated the procedure well   Assessment & Plan:  69 year old male with history of low-grade TA bladder cancer diagnosed 12/2013 status post surveillance cystoscopy today without evidence of recurrence. -Continue every 3 month cystoscopy      Problem List Items Addressed This Visit    None    Visit Diagnoses    Personal history of malignant neoplasm of bladder    -  Primary    Relevant Medications    ciprofloxacin (CIPRO) tablet 500 mg    lidocaine (XYLOCAINE) 2 % jelly 1 application    Other Relevant Orders    Cystoscopy    Gross hematuria        Relevant Orders    Urinalysis, Complete       Return in 3 months (on 10/31/2014) for Repeat Cystoscopy.  [place] [address] [place], [place] [address] [phone]

## 2014-08-01 LAB — URINALYSIS, COMPLETE: Urobilinogen, Ur: 1 mg/dL (ref 0.2–1.0)

## 2014-11-01 ENCOUNTER — Other Ambulatory Visit: Payer: Self-pay | Admitting: Urology

## 2014-11-08 ENCOUNTER — Other Ambulatory Visit: Payer: Self-pay | Admitting: Urology

## 2014-11-08 ENCOUNTER — Ambulatory Visit (INDEPENDENT_AMBULATORY_CARE_PROVIDER_SITE_OTHER): Payer: PPO | Admitting: Urology

## 2014-11-08 ENCOUNTER — Encounter: Payer: Self-pay | Admitting: Urology

## 2014-11-08 VITALS — BP 120/69 | HR 61 | Ht 67.0 in | Wt 207.3 lb

## 2014-11-08 DIAGNOSIS — C679 Malignant neoplasm of bladder, unspecified: Secondary | ICD-10-CM

## 2014-11-08 LAB — URINALYSIS, COMPLETE
Bilirubin, UA: NEGATIVE
Glucose, UA: NEGATIVE
KETONES UA: NEGATIVE
Leukocytes, UA: NEGATIVE
NITRITE UA: NEGATIVE
PH UA: 5.5 (ref 5.0–7.5)
Protein, UA: NEGATIVE
SPEC GRAV UA: 1.025 (ref 1.005–1.030)
Urobilinogen, Ur: 0.2 mg/dL (ref 0.2–1.0)

## 2014-11-08 LAB — MICROSCOPIC EXAMINATION: Bacteria, UA: NONE SEEN

## 2014-11-08 MED ORDER — LIDOCAINE HCL 2 % EX GEL
1.0000 "application " | Freq: Once | CUTANEOUS | Status: AC
  Administered 2014-11-08: 1 via URETHRAL

## 2014-11-08 MED ORDER — CIPROFLOXACIN HCL 500 MG PO TABS
500.0000 mg | ORAL_TABLET | Freq: Once | ORAL | Status: AC
  Administered 2014-11-08: 500 mg via ORAL

## 2014-11-08 NOTE — Progress Notes (Signed)
11/08/2014 4:30 PM   [name] [birth year] [id number]  Referring provider: Ricardo Jericho, NP [address] [place], [place] [address]  Chief Complaint  Patient presents with  . 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 He denies any hematuria, he denies LUTS. His last cystoscopy 3 months ago did not show any tumors. He denies fevers/chills/sweats. He denies nausea/vomiting   PMH: Past Medical History  Diagnosis Date  . Diabetes   . Hypercholesteremia   . Hematuria   . Anxiety   . Depression   . Myocardial infarct   . Heart disease   . Rectal bleeding   . GERD (gastroesophageal reflux disease)   . Benign enlargement of prostate   . Lesion of bladder   . H/O heart artery stent   . Benign essential HTN 06/11/2014  . Carotid artery narrowing 02/08/2014  . Arteriosclerosis of coronary artery 11/16/2011    Overview:  Stent 10/2011 stent rca 2015 with collaterals to lad which is chronically occluded   . BP (high blood pressure) 11/09/2012  . 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 Acid reflux 11/09/2012  . Diabetes mellitus, type 2 12/12/2012  . Fothergill's neuralgia 08/07/2012    Overview:  [name]   . Benign prostatic hypertrophy without urinary obstruction 07/31/2014  . Bilateral cataracts 05/16/2013    Overview:  [name]    . Clinical depression 12/12/2012  . Presence of stent in coronary artery 11/09/2012    Surgical History: Past Surgical History  Procedure Laterality Date  . Cardiac catheterization    . Appendectomy    . Coronary stent placement  2015    x5    Home Medications:    Medication List       This list is accurate as of: 11/08/14  4:30 PM.  Always use your most recent med list.               aspirin 81 MG tablet  Take 81 mg by mouth daily.     citalopram 40 MG tablet  Commonly known as:  CELEXA  Take 40 mg by mouth daily.     clopidogrel  75 MG tablet  Commonly known as:  PLAVIX  Take by mouth.     isosorbide mononitrate 30 MG 24 hr tablet  Commonly known as:  IMDUR  Take 30 mg by mouth daily.     losartan 25 MG tablet  Commonly known as:  COZAAR  Take 25 mg by mouth daily.     metFORMIN 500 MG tablet  Commonly known as:  GLUCOPHAGE  Take 500 mg by mouth 2 (two) times daily with a meal.     nitroGLYCERIN 0.4 MG SL tablet  Commonly known as:  NITROSTAT  Place 0.4 mg under the tongue every 5 (five) minutes as needed for chest pain.     Omega-3 1000 MG Caps  Take 1 g by mouth.     oxybutynin 15 MG 24 hr tablet  Commonly known as:  DITROPAN XL  Take 15 mg by mouth [name] bedtime.     pantoprazole 40 MG tablet  Commonly known as:  PROTONIX  Take 40 mg by mouth daily.     pravastatin 40 MG tablet  Commonly known as:  PRAVACHOL  Take 40 mg by mouth daily.     RA BLOOD GLUCOSE MONITOR Devi  by [name].     ticagrelor 90 MG Tabs tablet  Commonly known as:  BRILINTA  Take 90 mg by mouth 2 (two) times daily.     TRILEPTAL 150 MG tablet  Generic drug:  OXcarbazepine  1 qhs x 7 days; then 1 bid for seven days; then 1 qam and 2 qhs for seven days; then 2 bid     VITAMIN D-1000 MAX ST 1000 UNITS tablet  Generic drug:  Cholecalciferol  Take by mouth.        Allergies:  Allergies  Allergen Reactions  . Ace Inhibitors Cough  . Lyrica [Pregabalin] Rash    Family History: Family History  Problem Relation Age of Onset  . Kidney cancer Mother     Social History:  reports that he quit smoking about 25 years ago. He [name] not have any smokeless tobacco history on file. He reports that he [name] not drink alcohol or use illicit drugs.  ROS: UROLOGY Frequent Urination?: No Hard to postpone urination?: No Burning/pain with urination?: No Get up [name] night to urinate?: No Leakage of urine?: No Urine stream starts and stops?: No Trouble starting stream?: No Do you have to strain to urinate?:  No Blood in urine?: No Urinary tract infection?: No Sexually transmitted disease?: No Injury to kidneys or bladder?: No Painful intercourse?: No Weak stream?: No Erection problems?: No Penile pain?: No  Gastrointestinal Nausea?: No Vomiting?: No Indigestion/heartburn?: No Diarrhea?: No Constipation?: No  Constitutional Fever: No Night sweats?: No Weight loss?: No Fatigue?: No  Skin Skin rash/lesions?: No Itching?: No  Eyes Blurred vision?: No Double vision?: No  Ears/Nose/Throat Sore throat?: No Sinus problems?: No  Hematologic/Lymphatic Swollen glands?: No Easy bruising?: No  Cardiovascular Leg swelling?: No Chest pain?: No  Respiratory Cough?: No Shortness of breath?: No  Endocrine Excessive thirst?: Yes  Musculoskeletal Back pain?: No Joint pain?: No  Neurological Headaches?: No Dizziness?: No  Psychologic Depression?: No Anxiety?: No  Physical Exam: BP 120/69 mmHg  Pulse 61  Ht 5\' 7"  (1.702 m)  Wt 94.031 kg (207 lb 4.8 oz)  BMI 32.46 kg/m2  Constitutional:  Alert and oriented, No acute distress. HEENT: [name], moist mucus membranes.  Trachea midline, no masses. Cardiovascular: No clubbing, cyanosis, or edema. Respiratory: Normal respiratory effort, no increased work of breathing. GI: Abdomen is soft, nontender, nondistended, no abdominal masses GU: No CVA tenderness. Uncircumcised penis, no masses/lesions on penis, testis, scrotum Skin: No rashes, bruises or suspicious lesions. Lymph: No cervical or inguinal adenopathy. Neurologic: Grossly intact, no focal deficits, moving all 4 extremities. Psychiatric: Normal mood and affect.  Laboratory Data: Lab Results  Component Value Date   WBC 6.6 12/31/2013   HGB 10.7* 12/31/2013   HCT 35.0* 12/31/2013   MCV 70* 12/31/2013   PLT 265 12/31/2013    Lab Results  Component Value Date   CREATININE 1.16 08/23/2013    No results found for: PSA  No results found for:  TESTOSTERONE  Lab Results  Component Value Date   HGBA1C 6.8* 05/22/2012    Urinalysis    Component Value Date/Time   COLORURINE Yellow 12/31/2013 0916   APPEARANCEUR Clear 12/31/2013 0916   LABSPEC 1.018 12/31/2013 0916   PHURINE 5.0 12/31/2013 0916   GLUCOSEU Negative 12/31/2013 0916   HGBUR Negative 12/31/2013 0916   BILIRUBINUR Negative 12/31/2013 0916   KETONESUR Negative 12/31/2013 0916   PROTEINUR Negative 12/31/2013 0916   NITRITE Negative 12/31/2013 0916   LEUKOCYTESUR Negative 12/31/2013 0916  Cystoscopy Procedure Note  Patient identification was confirmed, informed consent was obtained, and patient was prepped using Betadine solution.  Lidocaine jelly was administered per urethral meatus.    Preoperative abx where received prior to procedure.     Pre-Procedure: - Inspection reveals a normal caliber ureteral meatus.  Procedure: The flexible cystoscope was introduced without difficulty - No urethral strictures/lesions are present. - Enlarged prostate bilobar hypertrophy - Elevated bladder neck - Bilateral ureteral orifices identified - Bladder mucosa  reveals no ulcers, tumors, or lesions - No bladder stones - No trabeculation  Retroflexion shows no masses/lesions, no intravesical prostatic protrusion   Post-Procedure: - Patient tolerated the procedure well    Assessment & Plan:    1. Malignant neoplasm of urinary bladder, unspecified site - cystoscopy normal today, RTC 3 months for office cystoscopy - Urinalysis, Complete - ciprofloxacin (CIPRO) tablet 500 mg; Take 1 tablet (500 mg total) by mouth once. - lidocaine (XYLOCAINE) 2 % jelly 1 application; Place 1 application into the urethra once.   Return in about 3 months (around 02/07/2015) for cystoscopy.  Cleon Gustin, [facility] [address] [place], [place] [address] [phone]

## 2014-12-31 DIAGNOSIS — I714 Abdominal aortic aneurysm, without rupture, unspecified: Secondary | ICD-10-CM | POA: Insufficient documentation

## 2015-01-22 ENCOUNTER — Encounter: Payer: Self-pay | Admitting: *Deleted

## 2015-01-22 ENCOUNTER — Observation Stay
Admission: RE | Admit: 2015-01-22 | Discharge: 2015-01-23 | Disposition: A | Discharge status: Home / Self Care | Payer: PPO | Source: Ambulatory Visit | Attending: Cardiology | Admitting: Cardiology

## 2015-01-22 ENCOUNTER — Encounter
Admission: RE | Disposition: A | Discharge status: Home / Self Care | Payer: Self-pay | Source: Ambulatory Visit | Attending: Cardiology

## 2015-01-22 DIAGNOSIS — E559 Vitamin D deficiency, unspecified: Secondary | ICD-10-CM | POA: Diagnosis not present

## 2015-01-22 DIAGNOSIS — I340 Nonrheumatic mitral (valve) insufficiency: Secondary | ICD-10-CM | POA: Insufficient documentation

## 2015-01-22 DIAGNOSIS — Z79899 Other long term (current) drug therapy: Secondary | ICD-10-CM | POA: Insufficient documentation

## 2015-01-22 DIAGNOSIS — I10 Essential (primary) hypertension: Secondary | ICD-10-CM | POA: Insufficient documentation

## 2015-01-22 DIAGNOSIS — R9439 Abnormal result of other cardiovascular function study: Secondary | ICD-10-CM | POA: Diagnosis present

## 2015-01-22 DIAGNOSIS — Z820 Family history of epilepsy and other diseases of the nervous system: Secondary | ICD-10-CM | POA: Diagnosis not present

## 2015-01-22 DIAGNOSIS — Z87891 Personal history of nicotine dependence: Secondary | ICD-10-CM | POA: Insufficient documentation

## 2015-01-22 DIAGNOSIS — Z7984 Long term (current) use of oral hypoglycemic drugs: Secondary | ICD-10-CM | POA: Insufficient documentation

## 2015-01-22 DIAGNOSIS — Z8249 Family history of ischemic heart disease and other diseases of the circulatory system: Secondary | ICD-10-CM | POA: Diagnosis not present

## 2015-01-22 DIAGNOSIS — R079 Chest pain, unspecified: Secondary | ICD-10-CM | POA: Diagnosis present

## 2015-01-22 DIAGNOSIS — H9192 Unspecified hearing loss, left ear: Secondary | ICD-10-CM | POA: Insufficient documentation

## 2015-01-22 DIAGNOSIS — Z800 Family history of malignant neoplasm of digestive organs: Secondary | ICD-10-CM | POA: Insufficient documentation

## 2015-01-22 DIAGNOSIS — I071 Rheumatic tricuspid insufficiency: Secondary | ICD-10-CM | POA: Diagnosis not present

## 2015-01-22 DIAGNOSIS — G500 Trigeminal neuralgia: Secondary | ICD-10-CM | POA: Insufficient documentation

## 2015-01-22 DIAGNOSIS — K219 Gastro-esophageal reflux disease without esophagitis: Secondary | ICD-10-CM | POA: Diagnosis not present

## 2015-01-22 DIAGNOSIS — F329 Major depressive disorder, single episode, unspecified: Secondary | ICD-10-CM | POA: Insufficient documentation

## 2015-01-22 DIAGNOSIS — I252 Old myocardial infarction: Secondary | ICD-10-CM | POA: Diagnosis not present

## 2015-01-22 DIAGNOSIS — Z8051 Family history of malignant neoplasm of kidney: Secondary | ICD-10-CM | POA: Insufficient documentation

## 2015-01-22 DIAGNOSIS — Z955 Presence of coronary angioplasty implant and graft: Secondary | ICD-10-CM | POA: Insufficient documentation

## 2015-01-22 DIAGNOSIS — Z7982 Long term (current) use of aspirin: Secondary | ICD-10-CM | POA: Diagnosis not present

## 2015-01-22 DIAGNOSIS — I25119 Atherosclerotic heart disease of native coronary artery with unspecified angina pectoris: Secondary | ICD-10-CM | POA: Diagnosis not present

## 2015-01-22 DIAGNOSIS — I200 Unstable angina: Secondary | ICD-10-CM | POA: Diagnosis present

## 2015-01-22 DIAGNOSIS — Z803 Family history of malignant neoplasm of breast: Secondary | ICD-10-CM | POA: Diagnosis not present

## 2015-01-22 DIAGNOSIS — E7800 Pure hypercholesterolemia, unspecified: Secondary | ICD-10-CM | POA: Diagnosis not present

## 2015-01-22 DIAGNOSIS — I2510 Atherosclerotic heart disease of native coronary artery without angina pectoris: Secondary | ICD-10-CM | POA: Diagnosis present

## 2015-01-22 HISTORY — PX: CARDIAC CATHETERIZATION: SHX172

## 2015-01-22 SURGERY — LEFT HEART CATH
Anesthesia: Moderate Sedation

## 2015-01-22 MED ORDER — SODIUM CHLORIDE 0.9 % IJ SOLN: 3.0000 mL | INTRAMUSCULAR | Status: DC | PRN

## 2015-01-22 MED ORDER — LOSARTAN POTASSIUM 50 MG PO TABS: 50.0000 mg | ORAL_TABLET | Freq: Every day | ORAL | Status: DC

## 2015-01-22 MED ORDER — ASPIRIN 81 MG PO CHEW
CHEWABLE_TABLET | ORAL | Status: AC
  Filled 2015-01-22: qty 3

## 2015-01-22 MED ORDER — PRAVASTATIN SODIUM 40 MG PO TABS
40.0000 mg | ORAL_TABLET | Freq: Every day | ORAL | Status: DC
  Administered 2015-01-22: 40 mg via ORAL
  Filled 2015-01-22: qty 1

## 2015-01-22 MED ORDER — FENTANYL CITRATE (PF) 100 MCG/2ML IJ SOLN
INTRAMUSCULAR | Status: AC
  Filled 2015-01-22: qty 2

## 2015-01-22 MED ORDER — MIDAZOLAM HCL 2 MG/2ML IJ SOLN
INTRAMUSCULAR | Status: DC | PRN
  Administered 2015-01-22: 1 mg via INTRAVENOUS

## 2015-01-22 MED ORDER — BIVALIRUDIN 250 MG IV SOLR
INTRAVENOUS | Status: AC
  Filled 2015-01-22: qty 250

## 2015-01-22 MED ORDER — SODIUM CHLORIDE 0.9 % IJ SOLN
3.0000 mL | Freq: Two times a day (BID) | INTRAMUSCULAR | Status: DC
  Administered 2015-01-22: 3 mL via INTRAVENOUS

## 2015-01-22 MED ORDER — IOHEXOL 300 MG/ML  SOLN
INTRAMUSCULAR | Status: DC | PRN
  Administered 2015-01-22: 35 mL via INTRA_ARTERIAL
  Administered 2015-01-22: 30 mL via INTRA_ARTERIAL
  Administered 2015-01-22: 100 mL via INTRA_ARTERIAL

## 2015-01-22 MED ORDER — MIDAZOLAM HCL 2 MG/2ML IJ SOLN
INTRAMUSCULAR | Status: DC | PRN
  Administered 2015-01-22: 0.5 mg via INTRAVENOUS

## 2015-01-22 MED ORDER — SODIUM CHLORIDE 0.9 % WEIGHT BASED INFUSION
3.0000 mL/kg/h | INTRAVENOUS | Status: AC
  Administered 2015-01-22: 3 mL/kg/h via INTRAVENOUS

## 2015-01-22 MED ORDER — METFORMIN HCL 500 MG PO TABS: 500.0000 mg | ORAL_TABLET | Freq: Every day | ORAL | Status: DC

## 2015-01-22 MED ORDER — FENTANYL CITRATE (PF) 100 MCG/2ML IJ SOLN
INTRAMUSCULAR | Status: DC | PRN
  Administered 2015-01-22: 25 ug via INTRAVENOUS

## 2015-01-22 MED ORDER — NITROGLYCERIN 5 MG/ML IV SOLN
INTRAVENOUS | Status: AC
  Filled 2015-01-22: qty 10

## 2015-01-22 MED ORDER — FENTANYL CITRATE (PF) 100 MCG/2ML IJ SOLN
INTRAMUSCULAR | Status: DC | PRN
  Administered 2015-01-22: 50 ug via INTRAVENOUS

## 2015-01-22 MED ORDER — ACETAMINOPHEN 325 MG PO TABS: 650.0000 mg | ORAL_TABLET | ORAL | Status: DC | PRN

## 2015-01-22 MED ORDER — ASPIRIN EC 325 MG PO TBEC: 325.0000 mg | DELAYED_RELEASE_TABLET | Freq: Every day | ORAL | Status: DC

## 2015-01-22 MED ORDER — CLOPIDOGREL BISULFATE 75 MG PO TABS
ORAL_TABLET | ORAL | Status: DC | PRN
  Administered 2015-01-22: 225 mg via ORAL

## 2015-01-22 MED ORDER — SODIUM CHLORIDE 0.9 % IJ SOLN: 10.0000 mL | Freq: Two times a day (BID) | INTRAMUSCULAR | Status: DC

## 2015-01-22 MED ORDER — ASPIRIN 81 MG PO CHEW
CHEWABLE_TABLET | ORAL | Status: DC | PRN
  Administered 2015-01-22: 243 mg via ORAL

## 2015-01-22 MED ORDER — ONDANSETRON HCL 4 MG/2ML IJ SOLN: 4.0000 mg | Freq: Four times a day (QID) | INTRAMUSCULAR | Status: DC | PRN

## 2015-01-22 MED ORDER — SODIUM CHLORIDE 0.9 % IV SOLN
250.0000 mg | INTRAVENOUS | Status: DC | PRN
  Administered 2015-01-22: 1.75 mg/kg/h via INTRAVENOUS

## 2015-01-22 MED ORDER — SODIUM CHLORIDE 0.9 % IV SOLN: 250.0000 mL | INTRAVENOUS | Status: DC | PRN

## 2015-01-22 MED ORDER — MIDAZOLAM HCL 2 MG/2ML IJ SOLN
INTRAMUSCULAR | Status: AC
  Filled 2015-01-22: qty 2

## 2015-01-22 MED ORDER — CLOPIDOGREL BISULFATE 75 MG PO TABS
75.0000 mg | ORAL_TABLET | Freq: Every day | ORAL | Status: DC
  Administered 2015-01-23: 75 mg via ORAL
  Filled 2015-01-22: qty 1

## 2015-01-22 MED ORDER — BIVALIRUDIN BOLUS VIA INFUSION - CUPID
INTRAVENOUS | Status: DC | PRN
  Administered 2015-01-22: 72.825 mg via INTRAVENOUS

## 2015-01-22 MED ORDER — SODIUM CHLORIDE 0.9 % IV SOLN: INTRAVENOUS | Status: DC

## 2015-01-22 MED ORDER — CLOPIDOGREL BISULFATE 75 MG PO TABS
ORAL_TABLET | ORAL | Status: AC
Start: 2015-01-22 — End: 2015-01-22
  Filled 2015-01-22: qty 3

## 2015-01-22 MED ORDER — CITALOPRAM HYDROBROMIDE 20 MG PO TABS: 40.0000 mg | ORAL_TABLET | Freq: Every day | ORAL | Status: DC

## 2015-01-22 MED ORDER — IOHEXOL 300 MG/ML  SOLN
INTRAMUSCULAR | Status: DC | PRN
  Administered 2015-01-22: 140 mL via INTRA_ARTERIAL

## 2015-01-22 MED ORDER — HEPARIN (PORCINE) IN NACL 2-0.9 UNIT/ML-% IJ SOLN
INTRAMUSCULAR | Status: AC
  Filled 2015-01-22: qty 1000

## 2015-01-22 SURGICAL SUPPLY — 16 items
BALLN TREK RX 2.5X12 (BALLOONS) ×3
BALLOON TREK RX 2.5X12 (BALLOONS) ×1 IMPLANT
CATH INFINITI 5FR ANG PIGTAIL (CATHETERS) ×3 IMPLANT
CATH INFINITI 5FR JL4 (CATHETERS) ×3 IMPLANT
CATH INFINITI JR4 5F (CATHETERS) ×3 IMPLANT
CATH VISTA GUIDE 6FR XB3.5 (CATHETERS) ×3 IMPLANT
DEVICE CLOSURE MYNXGRIP 6/7F (Vascular Products) ×3 IMPLANT
DEVICE INFLAT 30 PLUS (MISCELLANEOUS) ×3 IMPLANT
KIT MANI 3VAL PERCEP (MISCELLANEOUS) ×3 IMPLANT
NEEDLE PERC 18GX7CM (NEEDLE) ×3 IMPLANT
PACK CARDIAC CATH (CUSTOM PROCEDURE TRAY) ×3 IMPLANT
SHEATH AVANTI 6FR X 11CM (SHEATH) ×3 IMPLANT
SHEATH PINNACLE 5F 10CM (SHEATH) ×3 IMPLANT
STENT XIENCE ALPINE RX 3.25X15 (Permanent Stent) ×3 IMPLANT
WIRE ASAHI PROWATER 180CM (WIRE) ×3 IMPLANT
WIRE EMERALD 3MM-J .035X150CM (WIRE) ×3 IMPLANT

## 2015-01-22 NOTE — Progress Notes (Addendum)
Pt. admitted to 2A from specials, rm 250. Oriented to room, call bell, Ascom phones and staff. Bed in low position. Fall safety plan reviewed, yellow non-skid socks in place, bed alarm on. Wife at bedside, pt has no complaints of pain or discomfort. R femoral site WNL, no hematoma or bleeding, bilateral pedal pulses palpable +1. Tele box [id number] and verified with telemetry clerk. Full assessment to Epic. Will continue to monitor. Family to bring medication list so PTA med list can be updated.

## 2015-01-22 NOTE — Discharge Summary (Signed)
[facility] Cardiology Discharge Summary  Patient ID: [name] MRN: [id number] DOB/AGE: 02-Nov-1945 69 y.o.  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  Hospital Course: The patient was admitted to specials for cardiac cath with selective coronary angiogram after full consent, risk and benefits explained, and time out called with all approprate details voiced and discussed. The patient has had progressive canadian class 3 angina with high probability risk stress test consistent with ischemic chest pain and or anginal equivalent with coronary artery risk factors including high blood pressure and high cholesterol. The procedure was performed without complication and it revealed normal left ventricular function with ejection fraction of 55%.  It was found that the patient had severe 1 vessel coronary atherosclerosis with significant left anterior descending stenosis requiring further intervention. Therefore, the patient had a PCI and drug eluding stent placed without complication. The patient has been ambulating without further significant symptoms and has reached his maximal hospital benefit and will be discharged to home in good condition.  Cardiac rehabilitation has been discussed and recommended. Medication management of cardiovascular risk factors will be given post discharge and modified as an outpatient.   Discharge Exam: Blood pressure 159/69, pulse 50, temperature 97.9 F (36.6 C), temperature source Oral, resp. rate 17, height 5\' 7"  (1.702 m), weight 214 lb (97.07 kg), SpO2 96 %.  Constitutional: Alet oriented to person, place, and time. No distress.  HENT:  No nasal discharge.  Head: Normocephalic and atraumatic.  Eyes: Pupils are equal and round. No discharge.  Neck: Normal range of motion. Neck supple. No JVD present. No thyromegaly present.  Cardiovascular: Normal rate, regular rhythm, normal S1 S2, no gallop, no friction rub. No murmur Pulmonary/Chest: Effort normal, No stridor. No respiratory distress. no wheezes.  no rales.    Abdominal: Soft. Bowel sounds are normal.  no distension.  no tenderness. There is no rebound and no guarding.  Musculoskeletal: No edema, no cyanosis, normal pulses, no bleeding, Normal range of motion. no tenderness.  Neurological:  alert and oriented to person, place, and time. Coordination normal.  Skin: Skin is warm and dry. No rash noted. No erythema. No pallor.  Psychiatric:  normal mood and affect. behavior is normal.    Labs:   Lab Results  Component Value Date   WBC 6.6 12/31/2013   HGB 10.7* 12/31/2013   HCT 35.0* 12/31/2013   MCV 70* 12/31/2013   PLT 265 12/31/2013   No results for input(s): NA, K, CL, CO2, BUN, CREATININE, CALCIUM, PROT, BILITOT, ALKPHOS, ALT, AST, GLUCOSE in the last 168 hours.  Invalid input(s): LABALBU  EKG: NSR without evidence of new changes  FOLLOW UP IN ONE TO TWO WEEKS    Medication List    STOP taking these medications        losartan 25 MG tablet  Commonly known as:  COZAAR     nitroGLYCERIN 0.4 MG SL tablet  Commonly known as:  NITROSTAT     ticagrelor 90 MG Tabs tablet  Commonly known as:  BRILINTA     TRILEPTAL 150 MG tablet  Generic drug:  OXcarbazepine      TAKE these medications        aspirin 81 MG tablet  Take 81 mg by  mouth daily.     citalopram 40 MG tablet  Commonly known as:  CELEXA  Take 40 mg by mouth daily.     clopidogrel 75 MG tablet  Commonly known as:  PLAVIX  Take by mouth.     isosorbide mononitrate 30 MG 24 hr tablet  Commonly known as:  IMDUR  Take 30 mg by mouth daily.     metFORMIN 500 MG tablet  Commonly known  as:  GLUCOPHAGE  Take 500 mg by mouth 2 (two) times daily with a meal.     Omega-3 1000 MG Caps  Take 1 g by mouth.     oxybutynin 15 MG 24 hr tablet  Commonly known as:  DITROPAN XL  Take 15 mg by mouth at bedtime.     pantoprazole 40 MG tablet  Commonly known as:  PROTONIX  Take 40 mg by mouth daily.     pravastatin 40 MG tablet  Commonly known as:  PRAVACHOL  Take 40 mg by mouth daily.     RA BLOOD GLUCOSE MONITOR Devi  by Does not apply route.     VITAMIN D-1000 MAX ST 1000 UNITS tablet  Generic drug:  Cholecalciferol  Take by mouth.         THE PATIENT  SHALL BRING ALL MEDICATIONS TO FOLLOW UP APPOINTMENT  Signed:  Corey Skains MD, [facility] 01/22/2015, 1:17 PM

## 2015-01-22 NOTE — Care Management (Signed)
Elective cardiac cath resulting in PCI.  At present, patient is on Plavix which is not cost prohibitive

## 2015-01-22 NOTE — Progress Notes (Signed)
RN ambulated with patient around nurses station x2 (approx. 450ft). Pt tolerated well, no complaints of pain, shortness of breath, or dizziness. Encouraged patient to walk again this evening.

## 2015-01-22 NOTE — Progress Notes (Signed)
Report to Maddie.  Check right groin for bleeding or hematoma.  Patient will be on bedrest for 2 hours post sheath pull---out of bed at 11:15.  Bilateral pulses are 2's DP's.[name]

## 2015-01-23 ENCOUNTER — Encounter: Payer: Self-pay | Admitting: *Deleted

## 2015-01-23 DIAGNOSIS — I25119 Atherosclerotic heart disease of native coronary artery with unspecified angina pectoris: Secondary | ICD-10-CM | POA: Diagnosis not present

## 2015-01-23 LAB — BASIC METABOLIC PANEL
Anion gap: 7 (ref 5–15)
BUN: 13 mg/dL (ref 6–20)
CO2: 25 mmol/L (ref 22–32)
CREATININE: 0.98 mg/dL (ref 0.61–1.24)
Calcium: 9.1 mg/dL (ref 8.9–10.3)
Chloride: 106 mmol/L (ref 101–111)
GFR calc Af Amer: >60 mL/min (ref >60)
Glucose, Bld: 107 mg/dL — ABNORMAL HIGH (ref 65–99)
POTASSIUM: 4.5 mmol/L (ref 3.5–5.1)
SODIUM: 138 mmol/L (ref 135–145)

## 2015-01-23 LAB — CBC
HEMATOCRIT: 40.2 % (ref 40.0–52.0)
Hemoglobin: 13.5 g/dL (ref 13.0–18.0)
MCH: 25.7 pg — ABNORMAL LOW (ref 26.0–34.0)
MCHC: 33.4 g/dL (ref 32.0–36.0)
MCV: 76.8 fL — ABNORMAL LOW (ref 80.0–100.0)
PLATELETS: 164 10*3/uL (ref 150–440)
RBC: 5.24 MIL/uL (ref 4.40–5.90)
RDW: 31.3 % — AB (ref 11.5–14.5)
WBC: 7.5 10*3/uL (ref 3.8–10.6)

## 2015-01-23 NOTE — Progress Notes (Signed)
     [name] date of birth August 05, 1945 had a hospitalization and a procedure for cardiac standpoint and has complications and is recovering well. The patient has been started on appropriate medication management and will be able to return to work without restrictions on 01/27/2015.   Serafina Royals M.D. FACC

## 2015-01-23 NOTE — Care Management Obs Status (Signed)
[facility] NOTIFICATION   Patient Details  Name: [name] MRN: [id number] Date of Birth: [birth year]   Medicare Observation Status Notification Given:  Yes Letter given to pt. In room on unit    Beau Fanny, RN 01/23/2015, 9:15 AM

## 2015-02-07 ENCOUNTER — Other Ambulatory Visit: Payer: PPO | Admitting: Urology

## 2015-03-21 ENCOUNTER — Encounter: Payer: Self-pay | Admitting: Urology

## 2015-03-21 ENCOUNTER — Ambulatory Visit (INDEPENDENT_AMBULATORY_CARE_PROVIDER_SITE_OTHER): Payer: PPO | Admitting: Urology

## 2015-03-21 VITALS — BP 143/85 | HR 62 | Ht 67.0 in | Wt 209.7 lb

## 2015-03-21 DIAGNOSIS — C679 Malignant neoplasm of bladder, unspecified: Secondary | ICD-10-CM | POA: Diagnosis not present

## 2015-03-21 LAB — URINALYSIS, COMPLETE
BILIRUBIN UA: NEGATIVE
Glucose, UA: NEGATIVE
Ketones, UA: NEGATIVE
Leukocytes, UA: NEGATIVE
Nitrite, UA: NEGATIVE
PH UA: 5.5 (ref 5.0–7.5)
Protein, UA: NEGATIVE
RBC UA: NEGATIVE
Specific Gravity, UA: 1.02 (ref 1.005–1.030)
UUROB: 0.2 mg/dL (ref 0.2–1.0)

## 2015-03-21 LAB — MICROSCOPIC EXAMINATION
BACTERIA UA: NONE SEEN
Epithelial Cells (non renal): NONE SEEN /hpf (ref 0–10)
RBC, UA: NONE SEEN /hpf (ref 0–?)
WBC UA: NONE SEEN /HPF (ref 0–?)

## 2015-03-21 MED ORDER — LIDOCAINE HCL 2 % EX GEL
1.0000 "application " | Freq: Once | CUTANEOUS | Status: AC
  Administered 2015-03-21: 1 via URETHRAL

## 2015-03-21 MED ORDER — CIPROFLOXACIN HCL 500 MG PO TABS
500.0000 mg | ORAL_TABLET | Freq: Once | ORAL | Status: AC
  Administered 2015-03-21: 500 mg via ORAL

## 2015-03-21 NOTE — Progress Notes (Signed)
03/21/2015 3:16 PM   OZIAH BOBST Jun 02, 1945 [id number]  Referring provider: Ricardo Jericho, NP [address] [place], [place] [address]  Chief Complaint  Patient presents with  . Cysto    HPI: 70 yo M with history of painless gross hematuria s/p hematuria work up revealing a very small papillary bladder tumor just proximal to the LEFT UO. 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 He returns today for his routine 3 month surveillance cystoscopy.  No significant voiding symptoms. Denies UTIs or kidney stones.   CT Urogram from 11/30/13 shows no evidence of GU pathology.   PMH: Past Medical History  Diagnosis Date  . Diabetes ([place])   . Hypercholesteremia   . Hematuria   . Anxiety   . Depression   . Myocardial infarct ([place])   . Heart disease   . Rectal bleeding   . GERD (gastroesophageal reflux disease)   . Benign enlargement of prostate   . Lesion of bladder   . Benign essential HTN 06/11/2014  . Carotid artery narrowing 02/08/2014  . Arteriosclerosis of coronary artery 11/16/2011    Overview:  Stent 10/2011 stent rca 2015 with collaterals to lad which is chronically occluded   . BP (high blood pressure) 11/09/2012  . 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 Diabetes mellitus, type 2 ([place]) 12/12/2012  . Fothergill's neuralgia 08/07/2012    Overview:  [facility]   . Benign prostatic hypertrophy without urinary obstruction 07/31/2014  . Bilateral cataracts 05/16/2013    Overview:  [facility]. [facility]    . Presence of stent in coronary artery 11/09/2012    Surgical History: Past Surgical History  Procedure Laterality Date  . Cardiac catheterization    . Appendectomy    . Coronary stent placement  2015    x5  . Cardiac catheterization N/A 01/22/2015    Procedure: Left Heart Cath;  Surgeon: Corey Skains, MD;  Location: [facility];  Service:  Cardiovascular;  Laterality: N/A;  . Cardiac catheterization N/A 01/22/2015    Procedure: Coronary Stent Intervention;  Surgeon: Isaias Cowman, MD;  Location: [facility];  Service: Cardiovascular;  Laterality: N/A;    Home Medications:    Medication List       This list is accurate as of: 03/21/15  3:16 PM.  Always use your most recent med list.               aspirin 81 MG tablet  Take 81 mg by mouth daily.     citalopram 40 MG tablet  Commonly known as:  CELEXA  Take 40 mg by mouth daily.     clopidogrel 75 MG tablet  Commonly known as:  PLAVIX  Take by mouth.     isosorbide mononitrate 30 MG 24 hr tablet  Commonly known as:  IMDUR  Take 30 mg by mouth daily.     losartan 50 MG tablet  Commonly known as:  COZAAR  Take by mouth.     metFORMIN 500 MG tablet  Commonly known as:  GLUCOPHAGE  Take 500 mg by mouth 2 (two) times daily with a meal. Reported on 03/21/2015     nitroGLYCERIN 0.4 MG SL tablet  Commonly known as:  NITROSTAT  Place under the tongue.     Omega-3 1000 MG Caps  Take 1 g by mouth. Reported on 03/21/2015     oxybutynin 15  MG 24 hr tablet  Commonly known as:  DITROPAN XL  Take 15 mg by mouth at bedtime.     pantoprazole 40 MG tablet  Commonly known as:  PROTONIX  Take 40 mg by mouth daily.     pravastatin 40 MG tablet  Commonly known as:  PRAVACHOL  Take 40 mg by mouth daily.     RA BLOOD GLUCOSE MONITOR Devi  by Does not apply route.     TRILEPTAL 150 MG tablet  Generic drug:  OXcarbazepine  Reported on 03/21/2015     VITAMIN D-1000 MAX ST 1000 units tablet  Generic drug:  Cholecalciferol  Take by mouth.        Allergies:  Allergies  Allergen Reactions  . Ace Inhibitors Cough  . Lyrica [Pregabalin] Rash    Family History: Family History  Problem Relation Age of Onset  . Kidney cancer Mother   . Prostate cancer Neg Hx     Social History:  reports that he quit smoking about 25 years ago. He does not have  any smokeless tobacco history on file. He reports that he does not drink alcohol or use illicit drugs.   Physical Exam: BP 143/85 mmHg  Pulse 62  Ht 5\' 7"  (1.702 m)  Wt 209 lb 11.2 oz (95.119 kg)  BMI 32.84 kg/m2  Constitutional:  Alert and oriented, No acute distress. HEENT: [place] AT, moist mucus membranes.  Trachea midline, no masses. Cardiovascular: No clubbing, cyanosis, or edema. Respiratory: Normal respiratory effort, no increased work of breathing. GI: Abdomen is soft, nontender, nondistended, no abdominal masses GU: No CVA tenderness.  Skin: No rashes, bruises or suspicious lesions. Neurologic: Grossly intact, no focal deficits, moving all 4 extremities. Psychiatric: Normal mood and affect.  Laboratory Data: Lab Results  Component Value Date   WBC 7.5 01/23/2015   HGB 13.5 01/23/2015   HCT 40.2 01/23/2015   MCV 76.8* 01/23/2015   PLT 164 01/23/2015    Lab Results  Component Value Date   CREATININE 0.98 01/23/2015    Lab Results  Component Value Date   HGBA1C 6.8* 05/22/2012    Urinalysis UA negative today  Cystoscopy Procedure Note  Patient identification was confirmed, informed consent was obtained, and patient was prepped using Betadine solution. Lidocaine jelly was administered per urethral meatus.   Preoperative abx where received prior to procedure.    Pre-Procedure: - Inspection reveals a normal caliber ureteral meatus.  Procedure: The flexible cystoscope was introduced without difficulty - No urethral strictures/lesions are present. - Enlarged prostate with bilobar coaptation - Normal bladder neck - Bilateral ureteral orifices identified - Bladder mucosa reveals no ulcers, tumors, or lesions - No bladder stones - Mild/ moderate trabeculation  Retroflexion shows no remarkable median lobe.   Post-Procedure: - Patient tolerated the procedure well  Assessment & Plan:  70 year old male with history of low-grade TA bladder cancer diagnosed  12/2013 status post surveillance cystoscopy today without evidence of recurrence.  1. Malignant neoplasm of urinary bladder, unspecified site [facility]) Transition to q3 month cystoscopy x 2 years - Urinalysis, Complete   Return in about 3 months (around 06/19/2015).  [name], MD  [facility] [address] [place], [place] [address] [phone]

## 2015-03-21 NOTE — Addendum Note (Signed)
Addended by: Orlene Erm on: 03/21/2015 03:45 PM   Modules accepted: Orders

## 2015-05-30 DIAGNOSIS — J019 Acute sinusitis, unspecified: Secondary | ICD-10-CM | POA: Diagnosis not present

## 2015-05-31 DIAGNOSIS — E119 Type 2 diabetes mellitus without complications: Secondary | ICD-10-CM | POA: Diagnosis not present

## 2015-06-02 ENCOUNTER — Other Ambulatory Visit: Payer: Self-pay | Admitting: Nurse Practitioner

## 2015-06-02 DIAGNOSIS — Z800 Family history of malignant neoplasm of digestive organs: Secondary | ICD-10-CM | POA: Diagnosis not present

## 2015-06-02 DIAGNOSIS — R1031 Right lower quadrant pain: Secondary | ICD-10-CM | POA: Diagnosis not present

## 2015-06-02 DIAGNOSIS — Z8719 Personal history of other diseases of the digestive system: Secondary | ICD-10-CM

## 2015-06-02 DIAGNOSIS — Z860100 Personal history of colon polyps, unspecified: Secondary | ICD-10-CM

## 2015-06-02 DIAGNOSIS — R1319 Other dysphagia: Secondary | ICD-10-CM | POA: Diagnosis not present

## 2015-06-02 DIAGNOSIS — D509 Iron deficiency anemia, unspecified: Secondary | ICD-10-CM | POA: Diagnosis not present

## 2015-06-04 ENCOUNTER — Ambulatory Visit: Admission: RE | Admit: 2015-06-04 | Payer: PPO | Source: Ambulatory Visit

## 2015-06-11 ENCOUNTER — Ambulatory Visit
Admission: RE | Admit: 2015-06-11 | Discharge: 2015-06-11 | Disposition: A | Discharge status: Home / Self Care | Payer: PPO | Source: Ambulatory Visit | Attending: Nurse Practitioner | Admitting: Nurse Practitioner

## 2015-06-11 DIAGNOSIS — Z8719 Personal history of other diseases of the digestive system: Secondary | ICD-10-CM | POA: Diagnosis not present

## 2015-06-11 DIAGNOSIS — Z86010 Personal history of colonic polyps: Secondary | ICD-10-CM | POA: Insufficient documentation

## 2015-06-11 DIAGNOSIS — R1031 Right lower quadrant pain: Secondary | ICD-10-CM | POA: Insufficient documentation

## 2015-06-11 DIAGNOSIS — I700 Atherosclerosis of aorta: Secondary | ICD-10-CM | POA: Insufficient documentation

## 2015-06-11 DIAGNOSIS — K5730 Diverticulosis of large intestine without perforation or abscess without bleeding: Secondary | ICD-10-CM | POA: Diagnosis not present

## 2015-06-11 DIAGNOSIS — Z800 Family history of malignant neoplasm of digestive organs: Secondary | ICD-10-CM | POA: Diagnosis not present

## 2015-06-11 DIAGNOSIS — D509 Iron deficiency anemia, unspecified: Secondary | ICD-10-CM | POA: Diagnosis not present

## 2015-06-11 DIAGNOSIS — K760 Fatty (change of) liver, not elsewhere classified: Secondary | ICD-10-CM | POA: Insufficient documentation

## 2015-06-11 DIAGNOSIS — I77811 Abdominal aortic ectasia: Secondary | ICD-10-CM | POA: Diagnosis not present

## 2015-06-11 MED ORDER — IOPAMIDOL (ISOVUE-300) INJECTION 61%
100.0000 mL | Freq: Once | INTRAVENOUS | Status: AC | PRN
  Administered 2015-06-11: 100 mL via INTRAVENOUS

## 2015-06-12 DIAGNOSIS — I252 Old myocardial infarction: Secondary | ICD-10-CM | POA: Diagnosis not present

## 2015-06-12 DIAGNOSIS — I2510 Atherosclerotic heart disease of native coronary artery without angina pectoris: Secondary | ICD-10-CM | POA: Diagnosis not present

## 2015-06-12 DIAGNOSIS — E782 Mixed hyperlipidemia: Secondary | ICD-10-CM | POA: Diagnosis not present

## 2015-06-12 DIAGNOSIS — N5201 Erectile dysfunction due to arterial insufficiency: Secondary | ICD-10-CM | POA: Diagnosis not present

## 2015-06-16 DIAGNOSIS — N5201 Erectile dysfunction due to arterial insufficiency: Secondary | ICD-10-CM | POA: Insufficient documentation

## 2015-06-20 ENCOUNTER — Ambulatory Visit (INDEPENDENT_AMBULATORY_CARE_PROVIDER_SITE_OTHER): Payer: PPO | Admitting: Urology

## 2015-06-20 ENCOUNTER — Encounter: Payer: Self-pay | Admitting: Urology

## 2015-06-20 VITALS — BP 147/80 | HR 66 | Ht 67.0 in | Wt 213.0 lb

## 2015-06-20 DIAGNOSIS — C679 Malignant neoplasm of bladder, unspecified: Secondary | ICD-10-CM | POA: Diagnosis not present

## 2015-06-20 DIAGNOSIS — E119 Type 2 diabetes mellitus without complications: Secondary | ICD-10-CM | POA: Diagnosis not present

## 2015-06-20 DIAGNOSIS — E7800 Pure hypercholesterolemia, unspecified: Secondary | ICD-10-CM | POA: Insufficient documentation

## 2015-06-20 DIAGNOSIS — Z794 Long term (current) use of insulin: Secondary | ICD-10-CM | POA: Diagnosis not present

## 2015-06-20 LAB — MICROSCOPIC EXAMINATION: BACTERIA UA: NONE SEEN

## 2015-06-20 LAB — URINALYSIS, COMPLETE
Bilirubin, UA: NEGATIVE
Glucose, UA: NEGATIVE
Ketones, UA: NEGATIVE
LEUKOCYTES UA: NEGATIVE
Nitrite, UA: NEGATIVE
PH UA: 6 (ref 5.0–7.5)
Specific Gravity, UA: 1.02 (ref 1.005–1.030)
Urobilinogen, Ur: 1 mg/dL (ref 0.2–1.0)

## 2015-06-20 MED ORDER — LIDOCAINE HCL 2 % EX GEL
1.0000 "application " | Freq: Once | CUTANEOUS | Status: AC
  Administered 2015-06-20: 1 via URETHRAL

## 2015-06-20 MED ORDER — CIPROFLOXACIN HCL 500 MG PO TABS
500.0000 mg | ORAL_TABLET | Freq: Once | ORAL | Status: AC
  Administered 2015-06-20: 500 mg via ORAL

## 2015-06-20 NOTE — Progress Notes (Signed)
3:56 PM  06/20/2015   [name] 1945/11/21 [id number]  Referring provider: Ricardo Jericho, NP [address] [place], [place] [address]  Chief Complaint  Patient presents with  . Cysto    HPI: 70 yo M with history of painless gross hematuria s/p hematuria work up revealing a very small papillary bladder tumor just proximal to the LEFT UO. 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 He returns today for his routine 3 month surveillance cystoscopy.  No significant voiding symptoms. Denies UTIs or kidney stones.   CT Urogram from 11/30/13 shows no evidence of GU pathology.   PMH: Past Medical History  Diagnosis Date  . Diabetes ([place])   . Hypercholesteremia   . Hematuria   . Anxiety   . Depression   . Myocardial infarct ([place])   . Heart disease   . Rectal bleeding   . GERD (gastroesophageal reflux disease)   . Benign enlargement of prostate   . Lesion of bladder   . Benign essential HTN 06/11/2014  . Carotid artery narrowing 02/08/2014  . Arteriosclerosis of coronary artery 11/16/2011    Overview:  Stent 10/2011 stent rca 2015 with collaterals to lad which is chronically occluded   . BP (high blood pressure) 11/09/2012  . 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 Diabetes mellitus, type 2 ([place]) 12/12/2012  . Fothergill's neuralgia 08/07/2012    Overview:  [facility]   . Benign prostatic hypertrophy without urinary obstruction 07/31/2014  . Bilateral cataracts 05/16/2013    Overview:  Dr. [facility]    . Presence of stent in coronary artery 11/09/2012    Surgical History: Past Surgical History  Procedure Laterality Date  . Cardiac catheterization    . Appendectomy    . Coronary stent placement  2015    x5  . Cardiac catheterization N/A 01/22/2015    Procedure: Left Heart Cath;  Surgeon: Corey Skains, MD;  Location: [facility];  Service:  Cardiovascular;  Laterality: N/A;  . Cardiac catheterization N/A 01/22/2015    Procedure: Coronary Stent Intervention;  Surgeon: Isaias Cowman, MD;  Location: [facility];  Service: Cardiovascular;  Laterality: N/A;    Home Medications:    Medication List       This list is accurate as of: 06/20/15  3:56 PM.  Always use your most recent med list.               aspirin 81 MG tablet  Take 81 mg by mouth daily.     citalopram 40 MG tablet  Commonly known as:  CELEXA  Take 40 mg by mouth daily.     clopidogrel 75 MG tablet  Commonly known as:  PLAVIX  Take by mouth.     losartan 50 MG tablet  Commonly known as:  COZAAR  TAKE 1 TABLET (50 MG TOTAL) BY MOUTH ONCE DAILY.     nitroGLYCERIN 0.4 MG SL tablet  Commonly known as:  NITROSTAT  Place under the tongue.     Omega-3 1000 MG Caps  Take 1 g by mouth. Reported on 03/21/2015     oxybutynin 15 MG 24 hr tablet  Commonly known as:  DITROPAN XL  Take 15 mg by mouth at bedtime.     pantoprazole 40 MG tablet  Commonly known as:  PROTONIX  Take 40 mg by mouth daily.     pravastatin  40 MG tablet  Commonly known as:  PRAVACHOL  Take 40 mg by mouth daily.     RA BLOOD GLUCOSE MONITOR Devi  by Does not apply route.     TRILEPTAL 150 MG tablet  Generic drug:  OXcarbazepine  Reported on 03/21/2015     VITAMIN D-1000 MAX ST 1000 units tablet  Generic drug:  Cholecalciferol  Take by mouth.        Allergies:  Allergies  Allergen Reactions  . Ace Inhibitors Cough  . Lyrica [Pregabalin] Rash    Family History: Family History  Problem Relation Age of Onset  . Kidney cancer Mother   . Prostate cancer Neg Hx     Social History:  reports that he quit smoking about 25 years ago. He does not have any smokeless tobacco history on file. He reports that he does not drink alcohol or use illicit drugs.   Physical Exam: BP 147/80 mmHg  Pulse 66  Ht 5\' 7"  (1.702 m)  Wt 213 lb (96.616 kg)  BMI 33.35 kg/m2    Constitutional:  Alert and oriented, No acute distress. HEENT: [place] AT, moist mucus membranes.  Trachea midline, no masses. Cardiovascular: No clubbing, cyanosis, or edema. Respiratory: Normal respiratory effort, no increased work of breathing. GI: Abdomen is soft, nontender, nondistended, no abdominal masses GU: No CVA tenderness.  Skin: No rashes, bruises or suspicious lesions. Neurologic: Grossly intact, no focal deficits, moving all 4 extremities. Psychiatric: Normal mood and affect.  Laboratory Data: Lab Results  Component Value Date   WBC 7.5 01/23/2015   HGB 13.5 01/23/2015   HCT 40.2 01/23/2015   MCV 76.8* 01/23/2015   PLT 164 01/23/2015    Lab Results  Component Value Date   CREATININE 0.98 01/23/2015    Lab Results  Component Value Date   HGBA1C 6.8* 05/22/2012    Urinalysis UA negative today  Cystoscopy Procedure Note  Patient identification was confirmed, informed consent was obtained, and patient was prepped using Betadine solution. Lidocaine jelly was administered per urethral meatus.   Preoperative abx where received prior to procedure.    Pre-Procedure: - Inspection reveals a normal caliber ureteral meatus.  Procedure: The flexible cystoscope was introduced without difficulty - No urethral strictures/lesions are present. - Enlarged prostate with bilobar coaptation - Normal bladder neck - Bilateral ureteral orifices identified - Bladder mucosa reveals no ulcers, tumors, or lesions - No bladder stones - Moderate trabeculation  Retroflexion shows no remarkable median lobe.   Post-Procedure: - Patient tolerated the procedure well  Assessment & Plan:  70 year old male with history of low-grade TA bladder cancer diagnosed 12/2013 status post surveillance cystoscopy today without evidence of recurrence.  1. Malignant neoplasm of urinary bladder, unspecified site [facility]) Transition to q3 month cystoscopy x 2 years - Urinalysis,  Complete   Return in about 3 months (around 09/19/2015) for cystoscopy.  [name], MD  [facility] [address] [place], [place] [address] [phone]

## 2015-08-11 DIAGNOSIS — G500 Trigeminal neuralgia: Secondary | ICD-10-CM | POA: Diagnosis not present

## 2015-08-11 DIAGNOSIS — E119 Type 2 diabetes mellitus without complications: Secondary | ICD-10-CM | POA: Diagnosis not present

## 2015-08-11 DIAGNOSIS — Z7982 Long term (current) use of aspirin: Secondary | ICD-10-CM | POA: Diagnosis not present

## 2015-09-09 DIAGNOSIS — Z961 Presence of intraocular lens: Secondary | ICD-10-CM | POA: Diagnosis not present

## 2015-09-09 DIAGNOSIS — Z955 Presence of coronary angioplasty implant and graft: Secondary | ICD-10-CM | POA: Diagnosis not present

## 2015-09-09 DIAGNOSIS — Z9841 Cataract extraction status, right eye: Secondary | ICD-10-CM | POA: Diagnosis not present

## 2015-09-09 DIAGNOSIS — Z510 Encounter for antineoplastic radiation therapy: Secondary | ICD-10-CM | POA: Diagnosis not present

## 2015-09-09 DIAGNOSIS — G500 Trigeminal neuralgia: Secondary | ICD-10-CM | POA: Diagnosis not present

## 2015-09-09 DIAGNOSIS — Z9842 Cataract extraction status, left eye: Secondary | ICD-10-CM | POA: Diagnosis not present

## 2015-09-09 DIAGNOSIS — R7303 Prediabetes: Secondary | ICD-10-CM | POA: Diagnosis not present

## 2015-09-12 ENCOUNTER — Encounter: Payer: Self-pay | Admitting: *Deleted

## 2015-09-15 ENCOUNTER — Ambulatory Visit: Payer: PPO | Admitting: Anesthesiology

## 2015-09-15 ENCOUNTER — Ambulatory Visit: Admission: RE | Admit: 2015-09-15 | Payer: PPO | Source: Ambulatory Visit | Admitting: Gastroenterology

## 2015-09-15 ENCOUNTER — Encounter: Payer: Self-pay | Admitting: Anesthesiology

## 2015-09-15 ENCOUNTER — Ambulatory Visit
Admission: RE | Admit: 2015-09-15 | Discharge: 2015-09-15 | Disposition: A | Discharge status: Home / Self Care | Payer: PPO | Source: Ambulatory Visit | Attending: Gastroenterology | Admitting: Gastroenterology

## 2015-09-15 ENCOUNTER — Encounter
Admission: RE | Disposition: A | Discharge status: Home / Self Care | Payer: Self-pay | Source: Ambulatory Visit | Attending: Gastroenterology

## 2015-09-15 ENCOUNTER — Encounter: Admission: RE | Payer: Self-pay | Source: Ambulatory Visit

## 2015-09-15 DIAGNOSIS — R1310 Dysphagia, unspecified: Secondary | ICD-10-CM | POA: Insufficient documentation

## 2015-09-15 DIAGNOSIS — Z5309 Procedure and treatment not carried out because of other contraindication: Secondary | ICD-10-CM | POA: Diagnosis not present

## 2015-09-15 HISTORY — DX: Malignant (primary) neoplasm, unspecified: C80.1

## 2015-09-15 HISTORY — DX: Headache: R51

## 2015-09-15 HISTORY — DX: Unspecified hemorrhoids: K64.9

## 2015-09-15 HISTORY — DX: Headache, unspecified: R51.9

## 2015-09-15 HISTORY — DX: Other specified disorders of bladder: N32.89

## 2015-09-15 HISTORY — DX: Unspecified hearing loss, left ear: H91.92

## 2015-09-15 HISTORY — DX: Vitamin D deficiency, unspecified: E55.9

## 2015-09-15 HISTORY — DX: Trigeminal neuralgia: G50.0

## 2015-09-15 HISTORY — DX: Gastro-esophageal reflux disease without esophagitis: K21.9

## 2015-09-15 HISTORY — DX: Endocarditis, valve unspecified: I38

## 2015-09-15 SURGERY — COLONOSCOPY WITH PROPOFOL
Anesthesia: General

## 2015-09-15 MED ORDER — SODIUM CHLORIDE 0.9 % IV SOLN
INTRAVENOUS | Status: DC
  Administered 2015-09-15: 1000 mL via INTRAVENOUS

## 2015-09-15 NOTE — Anesthesia Preprocedure Evaluation (Deleted)
Anesthesia Evaluation  Patient identified by MRN, date of birth, ID band Patient awake    Reviewed: Allergy & Precautions, H&P , NPO status , Patient's Chart, lab work & pertinent test results, reviewed documented beta blocker date and time   History of Anesthesia Complications Negative for: history of anesthetic complications  Airway Mallampati: II  TM Distance: >3 FB Neck ROM: full    Dental no notable dental hx. (+) Upper Dentures, Lower Dentures, Edentulous Upper, Edentulous Lower   Pulmonary neg shortness of breath, neg sleep apnea, neg COPD, neg recent URI, former smoker,    Pulmonary exam normal breath sounds clear to auscultation       Cardiovascular Exercise Tolerance: Good hypertension, (-) angina+ CAD, + Past MI, + Cardiac Stents and + Peripheral Vascular Disease  (-) CABG Normal cardiovascular exam(-) dysrhythmias + Valvular Problems/Murmurs  Rhythm:regular Rate:Normal     Neuro/Psych neg Seizures PSYCHIATRIC DISORDERS (Depression)  Neuromuscular disease (Trigeminal neuralgia)    GI/Hepatic Neg liver ROS, GERD  Medicated,  Endo/Other  diabetes  Renal/GU negative Renal ROS  negative genitourinary   Musculoskeletal   Abdominal   Peds  Hematology negative hematology ROS (+)   Anesthesia Other Findings Past Medical History:   Diabetes ([place])                                               Hypercholesteremia                                           Hematuria                                                    Anxiety                                                      Depression                                                   Myocardial infarct ([facility])                                     Heart disease                                                Rectal bleeding                                              GERD (gastroesophageal reflux disease)  Benign enlargement of prostate                                Lesion of bladder                                            Benign essential HTN                            06/11/2014    Carotid artery narrowing                        02/08/2014   Arteriosclerosis of coronary artery             11/16/2011      Comment:Overview:  Stent 10/2011 stent rca 2015 with               collaterals to lad which is chronically               occluded    BP (high blood pressure)                        11/09/2012    Healed myocardial infarct                       11/09/2012    Diabetes mellitus, type 2 ([place])                 12/12/2012   Fothergill's neuralgia                          08/07/2012       Comment:Overview:  [facility]    Benign prostatic hypertrophy without urinary o* 07/31/2014     Bilateral cataracts                             05/16/2013      Comment:Overview:  Dr. [facility]     Presence of stent in coronary artery            11/09/2012    Headache                                                     Detrusor hypertrophy                                         Esophageal reflux                                            Hearing loss in left ear                                     Hemorrhoids  Cancer ([place])                                                   Comment:skin   Trigeminal neuralgia                                         Valvular heart disease                                       Vitamin D deficiency                                         Reproductive/Obstetrics negative OB ROS                             Anesthesia Physical Anesthesia Plan  ASA: III  Anesthesia Plan: General   Post-op Pain Management:    Induction:   Airway Management Planned:   Additional Equipment:   Intra-op Plan:   Post-operative Plan:   Informed Consent: I have reviewed the patients History and Physical, chart, labs and discussed the procedure including the  risks, benefits and alternatives for the proposed anesthesia with the patient or authorized representative who has indicated his/her understanding and acceptance.   Dental Advisory Given  Plan Discussed with: Anesthesiologist, CRNA and Surgeon  Anesthesia Plan Comments:         Anesthesia Quick Evaluation

## 2015-09-16 NOTE — H&P (Signed)
Patient presenting for EGD and colonoscopy as outpatient.  Unfortunately he did not hold his anticoagulation medicines, Plavix and ASA, having taken both through yesterday and this am. Will rearrange for future date.

## 2015-09-19 ENCOUNTER — Ambulatory Visit: Admission: RE | Admit: 2015-09-19 | Payer: PPO | Source: Ambulatory Visit | Admitting: Gastroenterology

## 2015-09-22 ENCOUNTER — Other Ambulatory Visit: Payer: Self-pay | Admitting: Gastroenterology

## 2015-09-22 DIAGNOSIS — R1310 Dysphagia, unspecified: Secondary | ICD-10-CM

## 2015-09-24 ENCOUNTER — Other Ambulatory Visit: Payer: PPO | Admitting: Urology

## 2015-09-25 ENCOUNTER — Encounter: Payer: Self-pay | Admitting: Urology

## 2015-09-25 ENCOUNTER — Ambulatory Visit (INDEPENDENT_AMBULATORY_CARE_PROVIDER_SITE_OTHER): Payer: PPO | Admitting: Urology

## 2015-09-25 ENCOUNTER — Ambulatory Visit: Admit: 2015-09-25 | Payer: PPO | Admitting: Gastroenterology

## 2015-09-25 VITALS — BP 167/83 | HR 57 | Ht 67.0 in | Wt 212.0 lb

## 2015-09-25 DIAGNOSIS — Z8551 Personal history of malignant neoplasm of bladder: Secondary | ICD-10-CM | POA: Diagnosis not present

## 2015-09-25 DIAGNOSIS — R310 Gross hematuria: Secondary | ICD-10-CM | POA: Diagnosis not present

## 2015-09-25 LAB — URINALYSIS, COMPLETE
Bilirubin, UA: NEGATIVE
GLUCOSE, UA: NEGATIVE
Ketones, UA: NEGATIVE
Leukocytes, UA: NEGATIVE
NITRITE UA: NEGATIVE
Protein, UA: NEGATIVE
Specific Gravity, UA: 1.02 (ref 1.005–1.030)
Urobilinogen, Ur: 0.2 mg/dL (ref 0.2–1.0)
pH, UA: 6 (ref 5.0–7.5)

## 2015-09-25 LAB — MICROSCOPIC EXAMINATION
BACTERIA UA: NONE SEEN
WBC, UA: NONE SEEN /hpf (ref 0–?)

## 2015-09-25 SURGERY — COLONOSCOPY WITH PROPOFOL
Anesthesia: General

## 2015-09-25 MED ORDER — CIPROFLOXACIN HCL 500 MG PO TABS
500.0000 mg | ORAL_TABLET | Freq: Once | ORAL | Status: AC
  Administered 2015-09-25: 500 mg via ORAL

## 2015-09-25 MED ORDER — LIDOCAINE HCL 2 % EX GEL
1.0000 "application " | Freq: Once | CUTANEOUS | Status: AC
  Administered 2015-09-25: 1 via URETHRAL

## 2015-09-25 NOTE — Progress Notes (Signed)
4:00 PM  09/25/15   [name] [birth year] [id number]  Referring provider: Ricardo Jericho, NP [address] [place], [place] [address]  Chief Complaint  Patient presents with  . Cysto    HPI: 70 yo M with history of painless gross hematuria s/p hematuria work up revealing a very small papillary bladder tumor just proximal to the LEFT UO. 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 He returns today for his routine 3 month surveillance cystoscopy.  No significant voiding symptoms. Denies UTIs or kidney stones.   CT Urogram from 06/20/15 shows no evidence of GU pathology.  Last cyst negative.     PMH: Past Medical History:  Diagnosis Date  . Anxiety   . Arteriosclerosis of coronary artery 11/16/2011   Overview:  Stent 10/2011 stent rca 2015 with collaterals to lad which is chronically occluded   . Benign enlargement of prostate   . Benign essential HTN 06/11/2014  . Benign prostatic hypertrophy without urinary obstruction 07/31/2014  . Bilateral cataracts 05/16/2013   Overview:  Dr. Tobe [facility]    . BP (high blood pressure) 11/09/2012  . Cancer ([place])    skin  . Carotid artery narrowing 02/08/2014  . Depression   . Detrusor hypertrophy   . Diabetes ([place])   . Diabetes mellitus, type 2 ([place]) 12/12/2012  . Esophageal reflux   . Fothergill's neuralgia 08/07/2012   Overview:  [facility]   . GERD (gastroesophageal reflux disease)   . Headache   . 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 Hearing loss in left ear   . Heart disease   . Hematuria   . Hemorrhoids   . Hypercholesteremia   . Lesion of bladder   . Myocardial infarct ([place])   . Presence of stent in coronary artery 11/09/2012  . Rectal bleeding   . Trigeminal neuralgia   . Valvular heart disease   . Vitamin D deficiency     Surgical History: Past Surgical History:  Procedure Laterality Date  .  APPENDECTOMY    . CARDIAC CATHETERIZATION    . CARDIAC CATHETERIZATION N/A 01/22/2015   Procedure: Left Heart Cath;  Surgeon: Corey Skains, MD;  Location: [facility];  Service: Cardiovascular;  Laterality: N/A;  . CARDIAC CATHETERIZATION N/A 01/22/2015   Procedure: Coronary Stent Intervention;  Surgeon: Isaias Cowman, MD;  Location: [facility];  Service: Cardiovascular;  Laterality: N/A;  . CORONARY STENT PLACEMENT  2015   x5  . HERNIA REPAIR    . urethral meatotomy      Home Medications:    Medication List       Accurate as of 09/25/15  4:00 PM. Always use your most recent med list.          aspirin 81 MG tablet Take 81 mg by mouth daily.   citalopram 40 MG tablet Commonly known as:  CELEXA Take 40 mg by mouth daily.   clopidogrel 75 MG tablet Commonly known as:  PLAVIX Take by mouth.   losartan 50 MG tablet Commonly known as:  COZAAR TAKE 1 TABLET (50 MG TOTAL) BY MOUTH ONCE DAILY.   metFORMIN 500 MG tablet Commonly known as:  GLUCOPHAGE Take 500 mg by mouth 2 (two) times daily with a meal. Reported on 09/15/2015   nitroGLYCERIN 0.4 MG SL tablet Commonly known as:  NITROSTAT Place under the tongue.   Omega-3 1000 MG  Caps Take 1 g by mouth. Reported on 03/21/2015   oxybutynin 15 MG 24 hr tablet Commonly known as:  DITROPAN XL Take 15 mg by mouth at bedtime.   pantoprazole 40 MG tablet Commonly known as:  PROTONIX Take 40 mg by mouth daily.   pravastatin 40 MG tablet Commonly known as:  PRAVACHOL Take 40 mg by mouth daily.   RA BLOOD GLUCOSE MONITOR Devi by Does not apply route.   sildenafil 50 MG tablet Commonly known as:  VIAGRA Take 50 mg by mouth daily as needed for erectile dysfunction.   TRILEPTAL 150 MG tablet Generic drug:  OXcarbazepine Reported on 03/21/2015   VITAMIN D-1000 MAX ST 1000 units tablet Generic drug:  Cholecalciferol Take by mouth.       Allergies:  Allergies  Allergen Reactions  . Ace  Inhibitors Cough  . Lyrica [Pregabalin] Rash    Family History: Family History  Problem Relation Age of Onset  . Kidney cancer Mother   . Prostate cancer Neg Hx     Social History:  reports that he quit smoking about 26 years ago. His smoking use included Cigarettes. He has never used smokeless tobacco. He reports that he drinks alcohol. He reports that he does not use drugs.   Physical Exam: BP (!) 167/83 (BP Location: Left Arm, Patient Position: Sitting, Cuff Size: Large)   Pulse (!) 57   Ht 5\' 7"  (1.702 m)   Wt 212 lb (96.2 kg)   BMI 33.20 kg/m   Constitutional:  Alert and oriented, No acute distress. HEENT: [place] AT, moist mucus membranes.  Trachea midline, no masses. Cardiovascular: No clubbing, cyanosis, or edema. Respiratory: Normal respiratory effort, no increased work of breathing. GI: Abdomen is soft, nontender, nondistended, no abdominal masses GU: No CVA tenderness.  Normal urethral meatus.  Normal phallus.   Skin: No rashes, bruises or suspicious lesions. Neurologic: Grossly intact, no focal deficits, moving all 4 extremities. Psychiatric: Normal mood and affect.  Laboratory Data: Lab Results  Component Value Date   WBC 7.5 01/23/2015   HGB 13.5 01/23/2015   HCT 40.2 01/23/2015   MCV 76.8 (L) 01/23/2015   PLT 164 01/23/2015    Lab Results  Component Value Date   CREATININE 0.98 01/23/2015    Lab Results  Component Value Date   HGBA1C 6.8 (H) 05/22/2012    Urinalysis UA negative today  Cystoscopy Procedure Note  Patient identification was confirmed, informed consent was obtained, and patient was prepped using Betadine solution. Lidocaine jelly was administered per urethral meatus.   Preoperative abx where received prior to procedure.    Pre-Procedure: - Inspection reveals a normal caliber ureteral meatus.  Procedure: The flexible cystoscope was introduced without difficulty - No urethral strictures/lesions are present. - Enlarged prostate  with bilobar coaptation - Normal bladder neck - Bilateral ureteral orifices identified - Bladder mucosa reveals no ulcers, tumors, or lesions - No bladder stones - Moderate trabeculation  Retroflexion shows no remarkable median lobe.   Post-Procedure: - Patient tolerated the procedure well  Assessment & Plan:  70 year old male with history of low-grade TA bladder cancer diagnosed 12/2013 status post surveillance cystoscopy today without evidence of recurrence.  1. Malignant neoplasm of urinary bladder, unspecified site [facility]) Transition to q6 month cystoscopy x 2 years - Urinalysis, Complete   Return in about 6 months (around 03/27/2016) for cystoscopy.  [name], MD  [facility] [address], [place], [place] [address] [phone]

## 2015-09-26 ENCOUNTER — Ambulatory Visit
Admission: RE | Admit: 2015-09-26 | Discharge: 2015-09-26 | Disposition: A | Discharge status: Home / Self Care | Payer: PPO | Source: Ambulatory Visit | Attending: Gastroenterology | Admitting: Gastroenterology

## 2015-09-26 DIAGNOSIS — K219 Gastro-esophageal reflux disease without esophagitis: Secondary | ICD-10-CM | POA: Diagnosis not present

## 2015-09-26 DIAGNOSIS — R1310 Dysphagia, unspecified: Secondary | ICD-10-CM | POA: Insufficient documentation

## 2015-09-26 DIAGNOSIS — K449 Diaphragmatic hernia without obstruction or gangrene: Secondary | ICD-10-CM | POA: Insufficient documentation

## 2015-12-05 ENCOUNTER — Encounter: Payer: Self-pay | Admitting: *Deleted

## 2015-12-08 ENCOUNTER — Ambulatory Visit
Admission: RE | Admit: 2015-12-08 | Discharge: 2015-12-08 | Disposition: A | Discharge status: Home / Self Care | Payer: PPO | Source: Ambulatory Visit | Attending: Gastroenterology | Admitting: Gastroenterology

## 2015-12-08 ENCOUNTER — Ambulatory Visit: Payer: PPO | Admitting: Anesthesiology

## 2015-12-08 ENCOUNTER — Encounter
Admission: RE | Disposition: A | Discharge status: Home / Self Care | Payer: Self-pay | Source: Ambulatory Visit | Attending: Gastroenterology

## 2015-12-08 ENCOUNTER — Encounter: Payer: Self-pay | Admitting: *Deleted

## 2015-12-08 DIAGNOSIS — I2510 Atherosclerotic heart disease of native coronary artery without angina pectoris: Secondary | ICD-10-CM | POA: Insufficient documentation

## 2015-12-08 DIAGNOSIS — I10 Essential (primary) hypertension: Secondary | ICD-10-CM | POA: Diagnosis not present

## 2015-12-08 DIAGNOSIS — F329 Major depressive disorder, single episode, unspecified: Secondary | ICD-10-CM | POA: Insufficient documentation

## 2015-12-08 DIAGNOSIS — E7800 Pure hypercholesterolemia, unspecified: Secondary | ICD-10-CM | POA: Diagnosis not present

## 2015-12-08 DIAGNOSIS — Z538 Procedure and treatment not carried out for other reasons: Secondary | ICD-10-CM | POA: Diagnosis not present

## 2015-12-08 DIAGNOSIS — K6389 Other specified diseases of intestine: Secondary | ICD-10-CM | POA: Diagnosis not present

## 2015-12-08 DIAGNOSIS — R1310 Dysphagia, unspecified: Secondary | ICD-10-CM | POA: Diagnosis not present

## 2015-12-08 DIAGNOSIS — K3189 Other diseases of stomach and duodenum: Secondary | ICD-10-CM | POA: Diagnosis not present

## 2015-12-08 DIAGNOSIS — Z6833 Body mass index (BMI) 33.0-33.9, adult: Secondary | ICD-10-CM | POA: Insufficient documentation

## 2015-12-08 DIAGNOSIS — Z955 Presence of coronary angioplasty implant and graft: Secondary | ICD-10-CM | POA: Diagnosis not present

## 2015-12-08 DIAGNOSIS — K219 Gastro-esophageal reflux disease without esophagitis: Secondary | ICD-10-CM | POA: Diagnosis not present

## 2015-12-08 DIAGNOSIS — I252 Old myocardial infarction: Secondary | ICD-10-CM | POA: Insufficient documentation

## 2015-12-08 DIAGNOSIS — K2960 Other gastritis without bleeding: Secondary | ICD-10-CM | POA: Diagnosis not present

## 2015-12-08 DIAGNOSIS — Z800 Family history of malignant neoplasm of digestive organs: Secondary | ICD-10-CM | POA: Diagnosis not present

## 2015-12-08 DIAGNOSIS — K2950 Unspecified chronic gastritis without bleeding: Secondary | ICD-10-CM | POA: Diagnosis not present

## 2015-12-08 DIAGNOSIS — Z8711 Personal history of peptic ulcer disease: Secondary | ICD-10-CM | POA: Diagnosis not present

## 2015-12-08 DIAGNOSIS — I38 Endocarditis, valve unspecified: Secondary | ICD-10-CM | POA: Insufficient documentation

## 2015-12-08 DIAGNOSIS — Z7902 Long term (current) use of antithrombotics/antiplatelets: Secondary | ICD-10-CM | POA: Diagnosis not present

## 2015-12-08 DIAGNOSIS — K449 Diaphragmatic hernia without obstruction or gangrene: Secondary | ICD-10-CM | POA: Diagnosis not present

## 2015-12-08 DIAGNOSIS — K921 Melena: Secondary | ICD-10-CM | POA: Diagnosis not present

## 2015-12-08 DIAGNOSIS — K228 Other specified diseases of esophagus: Secondary | ICD-10-CM | POA: Insufficient documentation

## 2015-12-08 DIAGNOSIS — D509 Iron deficiency anemia, unspecified: Secondary | ICD-10-CM | POA: Insufficient documentation

## 2015-12-08 DIAGNOSIS — Z7982 Long term (current) use of aspirin: Secondary | ICD-10-CM | POA: Insufficient documentation

## 2015-12-08 DIAGNOSIS — Z79899 Other long term (current) drug therapy: Secondary | ICD-10-CM | POA: Diagnosis not present

## 2015-12-08 DIAGNOSIS — K635 Polyp of colon: Secondary | ICD-10-CM | POA: Diagnosis not present

## 2015-12-08 DIAGNOSIS — E669 Obesity, unspecified: Secondary | ICD-10-CM | POA: Diagnosis not present

## 2015-12-08 DIAGNOSIS — E119 Type 2 diabetes mellitus without complications: Secondary | ICD-10-CM | POA: Insufficient documentation

## 2015-12-08 DIAGNOSIS — K2970 Gastritis, unspecified, without bleeding: Secondary | ICD-10-CM | POA: Insufficient documentation

## 2015-12-08 DIAGNOSIS — Z87891 Personal history of nicotine dependence: Secondary | ICD-10-CM | POA: Diagnosis not present

## 2015-12-08 DIAGNOSIS — K625 Hemorrhage of anus and rectum: Secondary | ICD-10-CM | POA: Diagnosis not present

## 2015-12-08 HISTORY — PX: ESOPHAGOGASTRODUODENOSCOPY (EGD) WITH PROPOFOL: SHX5813

## 2015-12-08 HISTORY — PX: COLONOSCOPY WITH PROPOFOL: SHX5780

## 2015-12-08 LAB — GLUCOSE, CAPILLARY: Glucose-Capillary: 103 mg/dL — ABNORMAL HIGH (ref 65–99)

## 2015-12-08 SURGERY — COLONOSCOPY WITH PROPOFOL
Anesthesia: General

## 2015-12-08 MED ORDER — GLYCOPYRROLATE 0.2 MG/ML IJ SOLN
INTRAMUSCULAR | Status: DC | PRN
  Administered 2015-12-08: 0.2 mg via INTRAVENOUS

## 2015-12-08 MED ORDER — SODIUM CHLORIDE 0.9 % IV SOLN
INTRAVENOUS | Status: DC
  Administered 2015-12-08: 1000 mL via INTRAVENOUS

## 2015-12-08 MED ORDER — PROPOFOL 500 MG/50ML IV EMUL
INTRAVENOUS | Status: DC | PRN
  Administered 2015-12-08: 125 ug/kg/min via INTRAVENOUS

## 2015-12-08 MED ORDER — FENTANYL CITRATE (PF) 100 MCG/2ML IJ SOLN
INTRAMUSCULAR | Status: DC | PRN
  Administered 2015-12-08: 50 ug via INTRAVENOUS

## 2015-12-08 MED ORDER — PROPOFOL 10 MG/ML IV BOLUS
INTRAVENOUS | Status: DC | PRN
  Administered 2015-12-08: 20 mg via INTRAVENOUS

## 2015-12-08 MED ORDER — SODIUM CHLORIDE 0.9 % IV SOLN: INTRAVENOUS | Status: DC

## 2015-12-08 MED ORDER — LIDOCAINE HCL (CARDIAC) 20 MG/ML IV SOLN
INTRAVENOUS | Status: DC | PRN
  Administered 2015-12-08: 100 mg via INTRAVENOUS

## 2015-12-08 NOTE — H&P (Signed)
Outpatient short stay form Pre-procedure 12/08/2015 10:07 AM [name] Sails MD  Primary Physician: Eulogio Bear NP  Reason for visit:  EGD and colonoscopy  History of present illness:  [name] is a 70 year old male presenting today as above. He has about a 6 month history of dysphagia. He has not been regurgitating foods. He did have a barium swallow showed a sliding hiatal hernia with some moderate reflux. There is also a B ring without evidence of obstruction. The cervical esophagus was noted to be widely patent. Area and tablet passed easily. 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 He has also been experiencing some rectal bleeding. There is a family history of colon cancer in father.  Patient does take Plavix and aspirin however states that he has not taken those for about 8 days. Patient's last colonoscopy was 07/07/2011 for rectal bleeding at that time felt to be related to hemorrhoids.    Current Facility-Administered Medications:  .  0.9 %  sodium chloride infusion, , Intravenous, Continuous, [name] Sails, MD, Last Rate: 20 mL/hr at 12/08/15 1004, 1,000 mL at 12/08/15 1004 .  0.9 %  sodium chloride infusion, , Intravenous, Continuous, [name] Sails, MD  Prescriptions Prior to Admission  Medication Sig Dispense Refill Last Dose  . clopidogrel (PLAVIX) 75 MG tablet Take by mouth.   11/30/2015 at Unknown time  . aspirin 81 MG tablet Take 81 mg by mouth daily.   Past Week at Unknown time  . Blood Glucose Monitoring Suppl (RA BLOOD GLUCOSE MONITOR) DEVI by Does not apply route.   09/14/2015 at Unknown time  . Cholecalciferol (VITAMIN D-1000 MAX ST) 1000 UNITS tablet Take by mouth.   Past Week at Unknown time  . citalopram (CELEXA) 40 MG tablet Take 40 mg by mouth daily.   09/14/2015 at Unknown time  . losartan (COZAAR) 50 MG tablet TAKE 1 TABLET (50 MG TOTAL) BY MOUTH ONCE DAILY.  6 09/15/2015 at 0515  . nitroGLYCERIN (NITROSTAT) 0.4 MG SL tablet Place under the tongue.    Past Month at Unknown time  . Omega-3 1000 MG CAPS Take 1 g by mouth. Reported on 03/21/2015   Past Week at Unknown time  . OXcarbazepine (TRILEPTAL) 150 MG tablet Reported on 03/21/2015   09/15/2015 at 0515  . oxybutynin (DITROPAN XL) 15 MG 24 hr tablet Take 15 mg by mouth at bedtime.   09/15/2015 at [place]  . pantoprazole (PROTONIX) 40 MG tablet Take 40 mg by mouth daily.   09/15/2015 at [place]  . pravastatin (PRAVACHOL) 40 MG tablet Take 40 mg by mouth daily.   09/15/2015 at 0515  . sildenafil (VIAGRA) 50 MG tablet Take 50 mg by mouth daily as needed for erectile dysfunction.   Past Month at Unknown time  . [DISCONTINUED] metFORMIN (GLUCOPHAGE) 500 MG tablet Take 500 mg by mouth 2 (two) times daily with a meal. Reported on 09/15/2015   Not Taking at Unknown time     Allergies  Allergen Reactions  . Ace Inhibitors Cough  . Lyrica [Pregabalin] Rash     Past Medical History:  Diagnosis Date  . Anxiety   . Arteriosclerosis of coronary artery 11/16/2011   Overview:  Stent 10/2011 stent rca 2015 with collaterals to lad which is chronically occluded   . Benign enlargement of prostate   . Benign essential HTN 06/11/2014  . Benign prostatic hypertrophy without urinary obstruction 07/31/2014  . Bilateral cataracts 05/16/2013   Overview:  [facility]    .  BP (high blood pressure) 11/09/2012  . Cancer ([place])    skin  . Carotid artery narrowing 02/08/2014  . Depression   . Detrusor hypertrophy   . Diabetes ([place])   . Diabetes mellitus, type 2 ([place]) 12/12/2012  . Esophageal reflux   . Fothergill's neuralgia 08/07/2012   Overview:  [facility]   . GERD (gastroesophageal reflux disease)   . Headache   . 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 Hearing loss in left ear   . Heart disease   . Hematuria   . Hemorrhoids   . Hypercholesteremia   . Lesion of bladder   . Myocardial infarct   . Presence of stent in coronary artery 11/09/2012  . Rectal bleeding   . Trigeminal neuralgia   .  Valvular heart disease   . Vitamin D deficiency     Review of systems:      Physical Exam    Heart and lungs: Regular rate and rhythm without rub or gallop, lungs are bilaterally clear.    HEENT: Normocephalic atraumatic eyes are anicteric    Other:     Pertinant exam for procedure: Protuberant, soft, nontender nondistended bowel sounds positive normoactive.    Planned proceedures: EGD, colonoscopy and indicated procedures. I have discussed the risks benefits and complications of procedures to include not limited to bleeding, infection, perforation and the risk of sedation and the patient wishes to proceed.    [name] Sails, MD Gastroenterology 12/08/2015  10:07 AM

## 2015-12-08 NOTE — Anesthesia Postprocedure Evaluation (Signed)
Anesthesia Post Note  Patient: [name]  Procedure(s) Performed: Procedure(s) (LRB): COLONOSCOPY WITH PROPOFOL (N/A) ESOPHAGOGASTRODUODENOSCOPY (EGD) WITH PROPOFOL (N/A)  Patient location during evaluation: Endoscopy Anesthesia Type: General Level of consciousness: awake and alert and oriented Pain management: pain level controlled Vital Signs Assessment: post-procedure vital signs reviewed and stable Respiratory status: spontaneous breathing, nonlabored ventilation and respiratory function stable Cardiovascular status: blood pressure returned to baseline and stable Postop Assessment: no signs of nausea or vomiting Anesthetic complications: no    Last Vitals:  Vitals:   12/08/15 1110 12/08/15 1130  BP: 118/82 139/86  Pulse:    Resp:    Temp:      Last Pain:  Vitals:   12/08/15 1100  TempSrc: Tympanic                 Xaivier Malay

## 2015-12-08 NOTE — Op Note (Signed)
[facility] Gastroenterology Patient Name: [name] Procedure Date: 12/08/2015 10:10 AM MRN: [id number] Account #: [id number] Date of Birth: 1946-02-19 Admit Type: Outpatient Age: 70 Room: [facility] ENDO ROOM 1 Gender: Male Note Status: Finalized Procedure:            Colonoscopy Indications:          Hematochezia, Iron deficiency anemia Providers:            Lollie Sails, MD Referring MD:         Charlean Merl. White (Referring MD), Dani Gobble. Dema Severin,                        MD (Referring MD) Medicines:            Monitored Anesthesia Care Complications:        No immediate complications. Procedure:            Pre-Anesthesia Assessment:                       - ASA Grade Assessment: III - A patient with severe                        systemic disease.                       After obtaining informed consent, the colonoscope was                        passed under direct vision. Throughout the procedure,                        the patient's blood pressure, pulse, and oxygen                        saturations were monitored continuously. The                        Colonoscope was introduced through the anus with the                        intention of advancing to the cecum. The scope was                        advanced to the sigmoid colon before the procedure was                        aborted. Medications were given. The colonoscopy was                        unusually difficult due to poor bowel prep with stool                        present. The quality of the bowel preparation was poor. Findings:      A diffuse area of moderate melanosis was found in the rectum and in the       sigmoid colon.      Liquid semi-liquid solid stool was found in the sigmoid colon,       precluding visualization. Impression:           - Preparation of the colon was poor.                       -  Melanosis in the colon.                       - Stool in the sigmoid colon.            - No specimens collected. Recommendation:       - reprep and reschedule. Procedure Code(s):    --- Professional ---                       [id number], 41, Colonoscopy, flexible; diagnostic, including                        collection of specimen(s) by brushing or washing, when                        performed (separate procedure) Diagnosis Code(s):    --- Professional ---                       K63.89, Other specified diseases of intestine                       K92.1, Melena (includes Hematochezia)                       D50.9, Iron deficiency anemia, unspecified CPT copyright 2016 American Medical Association. All rights reserved. The codes documented in this report are preliminary and upon coder review may  be revised to meet current compliance requirements. Lollie Sails, MD 12/08/2015 11:01:06 AM This report has been signed electronically. Number of Addenda: 0 Note Initiated On: 12/08/2015 10:10 AM Total Procedure Duration: 0 hours 4 minutes 23 seconds       [address] [facility]

## 2015-12-08 NOTE — Transfer of Care (Signed)
Immediate Anesthesia Transfer of Care Note  Patient: [name]  Procedure(s) Performed: Procedure(s): COLONOSCOPY WITH PROPOFOL (N/A) ESOPHAGOGASTRODUODENOSCOPY (EGD) WITH PROPOFOL (N/A)  Patient Location: PACU  Anesthesia Type:General  Level of Consciousness: sedated  Airway & Oxygen Therapy: Patient Spontanous Breathing and Patient connected to nasal cannula oxygen  Post-op Assessment: Report given to RN and Post -op Vital signs reviewed and stable  Post vital signs: Reviewed and stable  Last Vitals:  Vitals:   12/08/15 0945 12/08/15 1102  BP: (!) 145/79 (!) 130/92  Pulse: (!) 58 70  Resp: 16 12  Temp: 36.7 C     Last Pain:  Vitals:   12/08/15 0945  TempSrc: Tympanic         Complications: No apparent anesthesia complications

## 2015-12-08 NOTE — Anesthesia Procedure Notes (Signed)
Performed by: Lance Muss Pre-anesthesia Checklist: Patient identified, Emergency Drugs available, Suction available, Patient being monitored and Timeout performed Patient Re-evaluated:Patient Re-evaluated prior to inductionOxygen Delivery Method: Nasal cannula Intubation Type: IV induction Ventilation: Nasal airway inserted- appropriate to patient size

## 2015-12-08 NOTE — Op Note (Signed)
[facility] Gastroenterology Patient Name: [name] Procedure Date: 12/08/2015 10:11 AM MRN: [id number] Account #: [id number] Date of Birth: November 24, 1945 Admit Type: Outpatient Age: 70 Room: [facility] ENDO ROOM 1 Gender: Male Note Status: Finalized Procedure:            Upper GI endoscopy Indications:          Dysphagia Providers:            Lollie Sails, MD Referring MD:         Dani Gobble. Dema Severin, MD (Referring MD) Medicines:            Sedation Required Anesthesia Staff Assistance Complications:        No immediate complications. Procedure:            Pre-Anesthesia Assessment:                       - ASA Grade Assessment: III - A patient with severe                        systemic disease.                       After obtaining informed consent, the endoscope was                        passed under direct vision. Throughout the procedure,                        the patient's blood pressure, pulse, and oxygen                        saturations were monitored continuously. The Endoscope                        was introduced through the mouth, and advanced to the                        third part of duodenum. The upper GI endoscopy was                        accomplished without difficulty. The patient tolerated                        the procedure well. Findings:      The Z-line was irregular. Biopsies were taken with a cold forceps for       histology.      Abnormal motility was noted in the upper third of the esophagus, in the       middle third of the esophagus and in the lower third of the esophagus.       Was mildly prominant but otherwise normal. There is spasticity of the       esophageal body. Tertiary peristaltic waves are noted.      The exam of the esophagus was otherwise normal.      Patchy mild inflammation characterized by erythema was found in the       gastric body and in the gastric antrum. Biopsies were taken with a cold       forceps for  histology. Biopsies were taken with a cold forceps for       Helicobacter pylori testing.  The cardia and gastric fundus were normal on retroflexion.      The examined duodenum was normal.      No endoscopic abnormality was evident in the esophagus to explain the       patient's complaint of dysphagia, except finding of presbyesophagus. It       was decided, however, to proceed with dilation of the lower third of the       esophagus. A TTS dilator was passed through the scope. Dilation with a       12-13.5-15 mm balloon dilator was performed to 14.5 mm. Of note, the       esophageal lumen would open wider than the opened balloon at times. Impression:           - Z-line irregular. Biopsied.                       - Abnormal esophageal motility, consistent with                        presbyesophagus.                       - Gastritis. Biopsied.                       - Normal examined duodenum.                       - No endoscopic esophageal abnormality to explain                        patient's dysphagia. Esophagus dilated. Dilated. Recommendation:       - Discharge patient to home.                       - Await pathology results.                       - Continue present medications.                       - Return to GI clinic in 6 weeks. Procedure Code(s):    --- Professional ---                       [phone], Esophagogastroduodenoscopy, flexible, transoral;                        with transendoscopic balloon dilation of esophagus                        (less than 30 mm diameter)                       [id number], Esophagogastroduodenoscopy, flexible, transoral;                        with biopsy, single or multiple Diagnosis Code(s):    --- Professional ---                       K22.8, Other specified diseases of esophagus                       K22.4, Dyskinesia of esophagus  K29.70, Gastritis, unspecified, without bleeding                       R13.10, Dysphagia,  unspecified CPT copyright 2016 American Medical Association. All rights reserved. The codes documented in this report are preliminary and upon coder review may  be revised to meet current compliance requirements. Lollie Sails, MD 12/08/2015 10:49:25 AM This report has been signed electronically. Number of Addenda: 0 Note Initiated On: 12/08/2015 10:11 AM      [facility]

## 2015-12-08 NOTE — Anesthesia Preprocedure Evaluation (Signed)
Anesthesia Evaluation  Patient identified by MRN, date of birth, ID band Patient awake    Reviewed: Allergy & Precautions, NPO status , Patient's Chart, lab work & pertinent test results  History of Anesthesia Complications Negative for: history of anesthetic complications  Airway Mallampati: II  TM Distance: >3 FB Neck ROM: Full    Dental  (+) Lower Dentures, Upper Dentures   Pulmonary neg sleep apnea, neg COPD, former smoker,    breath sounds clear to auscultation- rhonchi (-) wheezing      Cardiovascular Exercise Tolerance: Good hypertension, (-) angina+ CAD, + Past MI and + Cardiac Stents   Rhythm:Regular Rate:Normal - Systolic murmurs and - Diastolic murmurs L heart cath 01/22/15:  Prox RCA lesion, 30% stenosed.  Dist LAD lesion, 100% stenosed.  Prox Cx lesion, 30% stenosed.  Mid LAD-2 lesion, 85% stenosed.  Mid LAD-1 lesion, 95% stenosed. Post intervention, there is a 0% residual stenosis.     Neuro/Psych  Headaches, Anxiety Depression    GI/Hepatic Neg liver ROS, GERD  ,  Endo/Other  diabetes, Type 2  Renal/GU negative Renal ROS     Musculoskeletal   Abdominal (+) + obese,   Peds  Hematology negative hematology ROS (+)   Anesthesia Other Findings   Reproductive/Obstetrics                             Anesthesia Physical Anesthesia Plan  ASA: III  Anesthesia Plan: General   Post-op Pain Management:    Induction: Intravenous  Airway Management Planned: Natural Airway  Additional Equipment:   Intra-op Plan:   Post-operative Plan:   Informed Consent: I have reviewed the patients History and Physical, chart, labs and discussed the procedure including the risks, benefits and alternatives for the proposed anesthesia with the patient or authorized representative who has indicated his/her understanding and acceptance.   Dental advisory given  Plan Discussed with: CRNA  and Anesthesiologist  Anesthesia Plan Comments:         Anesthesia Quick Evaluation

## 2015-12-09 ENCOUNTER — Encounter
Admission: RE | Disposition: A | Discharge status: Home / Self Care | Payer: Self-pay | Source: Ambulatory Visit | Attending: Gastroenterology

## 2015-12-09 ENCOUNTER — Encounter: Payer: Self-pay | Admitting: Gastroenterology

## 2015-12-09 ENCOUNTER — Ambulatory Visit
Admission: RE | Admit: 2015-12-09 | Discharge: 2015-12-09 | Disposition: A | Discharge status: Home / Self Care | Payer: PPO | Source: Ambulatory Visit | Attending: Gastroenterology | Admitting: Gastroenterology

## 2015-12-09 ENCOUNTER — Ambulatory Visit: Payer: PPO | Admitting: Anesthesiology

## 2015-12-09 DIAGNOSIS — Z79899 Other long term (current) drug therapy: Secondary | ICD-10-CM | POA: Insufficient documentation

## 2015-12-09 DIAGNOSIS — E7800 Pure hypercholesterolemia, unspecified: Secondary | ICD-10-CM | POA: Diagnosis not present

## 2015-12-09 DIAGNOSIS — K625 Hemorrhage of anus and rectum: Secondary | ICD-10-CM | POA: Diagnosis not present

## 2015-12-09 DIAGNOSIS — D509 Iron deficiency anemia, unspecified: Secondary | ICD-10-CM | POA: Diagnosis not present

## 2015-12-09 DIAGNOSIS — E559 Vitamin D deficiency, unspecified: Secondary | ICD-10-CM | POA: Insufficient documentation

## 2015-12-09 DIAGNOSIS — I252 Old myocardial infarction: Secondary | ICD-10-CM | POA: Diagnosis not present

## 2015-12-09 DIAGNOSIS — I10 Essential (primary) hypertension: Secondary | ICD-10-CM | POA: Diagnosis not present

## 2015-12-09 DIAGNOSIS — Z7982 Long term (current) use of aspirin: Secondary | ICD-10-CM | POA: Insufficient documentation

## 2015-12-09 DIAGNOSIS — I2510 Atherosclerotic heart disease of native coronary artery without angina pectoris: Secondary | ICD-10-CM | POA: Insufficient documentation

## 2015-12-09 DIAGNOSIS — K219 Gastro-esophageal reflux disease without esophagitis: Secondary | ICD-10-CM | POA: Insufficient documentation

## 2015-12-09 DIAGNOSIS — K5730 Diverticulosis of large intestine without perforation or abscess without bleeding: Secondary | ICD-10-CM | POA: Diagnosis not present

## 2015-12-09 DIAGNOSIS — Z7902 Long term (current) use of antithrombotics/antiplatelets: Secondary | ICD-10-CM | POA: Diagnosis not present

## 2015-12-09 DIAGNOSIS — K6389 Other specified diseases of intestine: Secondary | ICD-10-CM | POA: Insufficient documentation

## 2015-12-09 DIAGNOSIS — G500 Trigeminal neuralgia: Secondary | ICD-10-CM | POA: Diagnosis not present

## 2015-12-09 DIAGNOSIS — K5790 Diverticulosis of intestine, part unspecified, without perforation or abscess without bleeding: Secondary | ICD-10-CM | POA: Diagnosis not present

## 2015-12-09 DIAGNOSIS — E1136 Type 2 diabetes mellitus with diabetic cataract: Secondary | ICD-10-CM | POA: Diagnosis not present

## 2015-12-09 DIAGNOSIS — F329 Major depressive disorder, single episode, unspecified: Secondary | ICD-10-CM | POA: Diagnosis not present

## 2015-12-09 DIAGNOSIS — Z955 Presence of coronary angioplasty implant and graft: Secondary | ICD-10-CM | POA: Insufficient documentation

## 2015-12-09 HISTORY — PX: COLONOSCOPY WITH PROPOFOL: SHX5780

## 2015-12-09 SURGERY — COLONOSCOPY WITH PROPOFOL
Anesthesia: General

## 2015-12-09 MED ORDER — SODIUM CHLORIDE 0.9 % IV SOLN: INTRAVENOUS | Status: DC

## 2015-12-09 MED ORDER — PROPOFOL 500 MG/50ML IV EMUL
INTRAVENOUS | Status: DC | PRN
  Administered 2015-12-09: 100 ug/kg/min via INTRAVENOUS

## 2015-12-09 MED ORDER — PROPOFOL 10 MG/ML IV BOLUS
INTRAVENOUS | Status: DC | PRN
  Administered 2015-12-09: 30 mg via INTRAVENOUS

## 2015-12-09 MED ORDER — EPHEDRINE SULFATE 50 MG/ML IJ SOLN
INTRAMUSCULAR | Status: DC | PRN
  Administered 2015-12-09: 15 mg via INTRAVENOUS

## 2015-12-09 MED ORDER — SODIUM CHLORIDE 0.9 % IV SOLN
INTRAVENOUS | Status: DC
  Administered 2015-12-09: 11:00:00 via INTRAVENOUS

## 2015-12-09 NOTE — Op Note (Signed)
[facility] Gastroenterology Patient Name: [name] Procedure Date: 12/09/2015 11:51 AM MRN: [id number] Account #: [id number] Date of Birth: 10-Jan-1946 Admit Type: Outpatient Age: 70 Room: [facility] ENDO ROOM 3 Gender: Male Note Status: Finalized Procedure:            Colonoscopy Indications:          Rectal bleeding, Iron deficiency anemia Providers:            Lollie Sails, MD Referring MD:         Dani Gobble. Dema Severin, MD (Referring MD) Medicines:            Monitored Anesthesia Care Complications:        No immediate complications. Procedure:            Pre-Anesthesia Assessment:                       - ASA Grade Assessment: III - A patient with severe                        systemic disease.                       After obtaining informed consent, the colonoscope was                        passed under direct vision. Throughout the procedure,                        the patient's blood pressure, pulse, and oxygen                        saturations were monitored continuously. The                        Colonoscope was introduced through the anus and                        advanced to the the cecum, identified by appendiceal                        orifice and ileocecal valve. The colonoscopy was                        unusually difficult due to marked melanosis coli. The                        patient tolerated the procedure well. The quality of                        the bowel preparation was fair. The patient tolerated                        the procedure well. Findings:      A diffuse area of severely melanotic mucosa was found in the entire       colon more marked proximally.      Multiple small and large-mouthed diverticula were found in the sigmoid       colon and descending colon.      The digital rectal exam was normal.      The exam was otherwise without abnormality. Impression:           -  Preparation of the colon was fair.  - Melanotic mucosa in the entire examined colon.                       - Diverticulosis in the sigmoid colon and in the                        descending colon.                       - No specimens collected. Recommendation:       - Discharge patient to home.                       - Use Lactulose at 1 tbsp PO BID daily. Procedure Code(s):    --- Professional ---                       [phone], Colonoscopy, flexible; diagnostic, including                        collection of specimen(s) by brushing or washing, when                        performed (separate procedure) Diagnosis Code(s):    --- Professional ---                       K63.89, Other specified diseases of intestine                       K62.5, Hemorrhage of anus and rectum                       D50.9, Iron deficiency anemia, unspecified                       K57.30, Diverticulosis of large intestine without                        perforation or abscess without bleeding CPT copyright 2016 American Medical Association. All rights reserved. The codes documented in this report are preliminary and upon coder review may  be revised to meet current compliance requirements. Lollie Sails, MD 12/09/2015 12:36:53 PM This report has been signed electronically. Number of Addenda: 0 Note Initiated On: 12/09/2015 11:51 AM Scope Withdrawal Time: 0 hours 3 minutes 49 seconds  Total Procedure Duration: 0 hours 27 minutes 30 seconds       [facility]

## 2015-12-09 NOTE — H&P (Signed)
Outpatient short stay form Pre-procedure 12/09/2015 11:53 AM Lollie Sails MD  Primary Physician: Eulogio Bear NP  Reason for visit:  Colonoscopy  History of present illness:  Patient is a 70 year old male returning today for colonoscopy. We attempted that yesterday however prep was not good. He does take Plavix and aspirin but has held that now for local over week. Tolerated. Prep well. He takes no other aspirin or blood thinning agents.    Current Facility-Administered Medications:  .  0.9 %  sodium chloride infusion, , Intravenous, Continuous, Lollie Sails, MD, Last Rate: 20 mL/hr at 12/09/15 1053 .  0.9 %  sodium chloride infusion, , Intravenous, Continuous, Lollie Sails, MD  Prescriptions Prior to Admission  Medication Sig Dispense Refill Last Dose  . gabapentin (NEURONTIN) 100 MG capsule Take 100 mg by mouth 3 (three) times daily.   12/09/2015 at 0700  . losartan (COZAAR) 50 MG tablet TAKE 1 TABLET (50 MG TOTAL) BY MOUTH ONCE DAILY.  6 12/09/2015 at 0700  . aspirin 81 MG tablet Take 81 mg by mouth daily.   Past Week at Unknown time  . Blood Glucose Monitoring Suppl (RA BLOOD GLUCOSE MONITOR) DEVI by Does not apply route.   09/14/2015 at Unknown time  . Cholecalciferol (VITAMIN D-1000 MAX ST) 1000 UNITS tablet Take by mouth.   Past Week at Unknown time  . citalopram (CELEXA) 40 MG tablet Take 40 mg by mouth daily.   09/14/2015 at Unknown time  . clopidogrel (PLAVIX) 75 MG tablet Take by mouth.   11/30/2015 at Unknown time  . nitroGLYCERIN (NITROSTAT) 0.4 MG SL tablet Place under the tongue.   Past Month at Unknown time  . Omega-3 1000 MG CAPS Take 1 g by mouth. Reported on 03/21/2015   Past Week at Unknown time  . OXcarbazepine (TRILEPTAL) 150 MG tablet Reported on 03/21/2015   09/15/2015 at 0515  . oxybutynin (DITROPAN XL) 15 MG 24 hr tablet Take 15 mg by mouth at bedtime.   09/15/2015 at [place]  . pantoprazole (PROTONIX) 40 MG tablet Take 40 mg by mouth daily.   09/15/2015  at [place]  . pravastatin (PRAVACHOL) 40 MG tablet Take 40 mg by mouth daily.   09/15/2015 at 0515  . sildenafil (VIAGRA) 50 MG tablet Take 50 mg by mouth daily as needed for erectile dysfunction.   Past Month at Unknown time     Allergies  Allergen Reactions  . Ace Inhibitors Cough  . Lyrica [Pregabalin] Rash     Past Medical History:  Diagnosis Date  . Anxiety   . Arteriosclerosis of coronary artery 11/16/2011   Overview:  Stent 10/2011 stent rca 2015 with collaterals to lad which is chronically occluded   . Benign enlargement of prostate   . Benign essential HTN 06/11/2014  . Benign prostatic hypertrophy without urinary obstruction 07/31/2014  . Bilateral cataracts 05/16/2013   Overview:  Dr. Tobe Sos [facility]    . BP (high blood pressure) 11/09/2012  . Cancer ([place])    skin  . Carotid artery narrowing 02/08/2014  . Depression   . Detrusor hypertrophy   . Diabetes ([place])   . Diabetes mellitus, type 2 ([place]) 12/12/2012  . Esophageal reflux   . Fothergill's neuralgia 08/07/2012   Overview:  [facility]   . GERD (gastroesophageal reflux disease)   . Headache   . 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 Hearing loss in left ear   . Heart disease   . Hematuria   . Hemorrhoids   .  Hypercholesteremia   . Lesion of bladder   . Myocardial infarct   . Presence of stent in coronary artery 11/09/2012  . Rectal bleeding   . Trigeminal neuralgia   . Valvular heart disease   . Vitamin D deficiency     Review of systems:      Physical Exam    Heart and lungs: Regular rate and rhythm without rub or gallop, lungs are bilaterally clear.    HEENT: Normocephalic atraumatic eyes are anicteric    Other:     Pertinant exam for procedure: Soft, protuberant, nontender nondistended bowel sounds are positive and normoactive.    Planned proceedures: Colonoscopy and indicated procedures. I have discussed the risks benefits and complications of procedures to include not limited to bleeding,  infection, perforation and the risk of sedation and the patient wishes to proceed.    Lollie Sails, MD Gastroenterology 12/09/2015  11:53 AM

## 2015-12-09 NOTE — Transfer of Care (Signed)
Immediate Anesthesia Transfer of Care Note  Patient: [name]  Procedure(s) Performed: Procedure(s): COLONOSCOPY WITH PROPOFOL (N/A)  Patient Location: PACU  Anesthesia Type:General  Level of Consciousness: sedated  Airway & Oxygen Therapy: Patient Spontanous Breathing and Patient connected to nasal cannula oxygen  Post-op Assessment: Report given to RN and Post -op Vital signs reviewed and stable  Post vital signs: Reviewed and stable  Last Vitals:  Vitals:   12/09/15 1045  BP: (!) 153/78  Pulse: (!) 59  Resp: 18  Temp: 36.4 C    Last Pain:  Vitals:   12/09/15 1045  TempSrc: Tympanic         Complications: No apparent anesthesia complications

## 2015-12-09 NOTE — Anesthesia Preprocedure Evaluation (Signed)
Anesthesia Evaluation  Patient identified by MRN, date of birth, ID band Patient awake    Reviewed: Allergy & Precautions, H&P , NPO status , Patient's Chart, lab work & pertinent test results, reviewed documented beta blocker date and time   Airway Mallampati: III   Neck ROM: full    Dental  (+) Poor Dentition   Pulmonary neg pulmonary ROS, former smoker,    Pulmonary exam normal        Cardiovascular hypertension, (-) angina+ CAD, + Past MI and + Peripheral Vascular Disease  negative cardio ROS Normal cardiovascular exam Rate:Normal  Clearance per kowalski   Neuro/Psych  Headaches, PSYCHIATRIC DISORDERS  Neuromuscular disease negative neurological ROS  negative psych ROS   GI/Hepatic negative GI ROS, Neg liver ROS, GERD  Medicated,  Endo/Other  negative endocrine ROSdiabetes  Renal/GU negative Renal ROS  negative genitourinary   Musculoskeletal   Abdominal   Peds  Hematology negative hematology ROS (+)   Anesthesia Other Findings Past Medical History: No date: Anxiety 11/16/2011: Arteriosclerosis of coronary artery     Comment: Overview:  Stent 10/2011 stent rca 2015 with               collaterals to lad which is chronically               occluded  No date: Benign enlargement of prostate 06/11/2014: Benign essential HTN 07/31/2014: Benign prostatic hypertrophy without urinary o* 05/16/2013: Bilateral cataracts     Comment: Overview:  Dr. Cannon [facility]   11/09/2012: BP (high blood pressure) No date: Cancer ([place])     Comment: skin 02/08/2014: Carotid artery narrowing No date: Depression No date: Detrusor hypertrophy No date: Diabetes ([place]) 12/12/2012: Diabetes mellitus, type 2 ([facility]) No date: Esophageal reflux 08/07/2012: Fothergill's neuralgia     Comment: Overview:  [facility]  No date: GERD (gastroesophageal reflux disease) No date: Headache 11/09/2012: Healed myocardial infarct No date: Hearing  loss in left ear No date: Heart disease No date: Hematuria No date: Hemorrhoids No date: Hypercholesteremia No date: Lesion of bladder No date: Myocardial infarct 11/09/2012: Presence of stent in coronary artery No date: Rectal bleeding No date: Trigeminal neuralgia No date: Valvular heart disease No date: Vitamin D deficiency Past Surgical History: No date: APPENDECTOMY No date: CARDIAC CATHETERIZATION 01/22/2015: CARDIAC CATHETERIZATION N/A     Comment: Procedure: Left Heart Cath;  Surgeon: Corey Skains, MD;  Location: [facility];               Service: Cardiovascular;  Laterality: N/A; 01/22/2015: CARDIAC CATHETERIZATION N/A     Comment: Procedure: Coronary Stent Intervention;                Surgeon: Isaias Cowman, MD;  Location:               [place] [facility];  Service: Cardiovascular;              Laterality: N/A; 12/08/2015: COLONOSCOPY WITH PROPOFOL N/A     Comment: Procedure: COLONOSCOPY WITH PROPOFOL;                Surgeon: Lollie Sails, MD;  Location: [facility]              ENDOSCOPY;  Service: Endoscopy;  Laterality:               N/A; 2015: CORONARY STENT PLACEMENT     Comment:  x5 12/08/2015: ESOPHAGOGASTRODUODENOSCOPY (EGD) WITH PROPOFOL N/A     Comment: Procedure: ESOPHAGOGASTRODUODENOSCOPY (EGD)               WITH PROPOFOL;  Surgeon: Lollie Sails, MD;              Location: [facility] ENDOSCOPY;  Service: Endoscopy;               Laterality: N/A; No date: HERNIA REPAIR 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 No date: urethral meatotomy BMI    Body Mass Index:  33.52 kg/m     Reproductive/Obstetrics                             Anesthesia Physical Anesthesia Plan  ASA: III  Anesthesia Plan: General   Post-op Pain Management:    Induction:   Airway Management Planned:   Additional Equipment:   Intra-op Plan:   Post-operative Plan:   Informed Consent: I have reviewed the patients History and  Physical, chart, labs and discussed the procedure including the risks, benefits and alternatives for the proposed anesthesia with the patient or authorized representative who has indicated his/her understanding and acceptance.   Dental Advisory Given  Plan Discussed with: CRNA  Anesthesia Plan Comments:         Anesthesia Quick Evaluation

## 2015-12-10 ENCOUNTER — Encounter: Payer: Self-pay | Admitting: Gastroenterology

## 2015-12-10 LAB — SURGICAL PATHOLOGY

## 2015-12-10 NOTE — Anesthesia Postprocedure Evaluation (Signed)
Anesthesia Post Note  Patient: [name]  Procedure(s) Performed: Procedure(s) (LRB): COLONOSCOPY WITH PROPOFOL (N/A)  Patient location during evaluation: PACU Anesthesia Type: General Level of consciousness: awake and alert Pain management: pain level controlled Vital Signs Assessment: post-procedure vital signs reviewed and stable Respiratory status: spontaneous breathing, nonlabored ventilation, respiratory function stable and patient connected to nasal cannula oxygen Cardiovascular status: blood pressure returned to baseline and stable Postop Assessment: no signs of nausea or vomiting Anesthetic complications: no    Last Vitals:  Vitals:   12/09/15 1238 12/09/15 1248  BP: 126/65 132/77  Pulse: (!) 59 72  Resp: 16 20  Temp: (!) 35.9 C     Last Pain:  Vitals:   12/10/15 0736  TempSrc:   PainSc: 0-No pain                 Molli Barrows

## 2015-12-23 DIAGNOSIS — F329 Major depressive disorder, single episode, unspecified: Secondary | ICD-10-CM | POA: Diagnosis not present

## 2015-12-23 DIAGNOSIS — Z125 Encounter for screening for malignant neoplasm of prostate: Secondary | ICD-10-CM | POA: Diagnosis not present

## 2015-12-23 DIAGNOSIS — I714 Abdominal aortic aneurysm, without rupture: Secondary | ICD-10-CM | POA: Diagnosis not present

## 2015-12-23 DIAGNOSIS — I2510 Atherosclerotic heart disease of native coronary artery without angina pectoris: Secondary | ICD-10-CM | POA: Diagnosis not present

## 2015-12-23 DIAGNOSIS — E119 Type 2 diabetes mellitus without complications: Secondary | ICD-10-CM | POA: Diagnosis not present

## 2015-12-23 DIAGNOSIS — Z794 Long term (current) use of insulin: Secondary | ICD-10-CM | POA: Diagnosis not present

## 2015-12-23 DIAGNOSIS — I10 Essential (primary) hypertension: Secondary | ICD-10-CM | POA: Diagnosis not present

## 2015-12-31 DIAGNOSIS — I252 Old myocardial infarction: Secondary | ICD-10-CM | POA: Diagnosis not present

## 2015-12-31 DIAGNOSIS — I6523 Occlusion and stenosis of bilateral carotid arteries: Secondary | ICD-10-CM | POA: Diagnosis not present

## 2015-12-31 DIAGNOSIS — I714 Abdominal aortic aneurysm, without rupture: Secondary | ICD-10-CM | POA: Diagnosis not present

## 2015-12-31 DIAGNOSIS — E669 Obesity, unspecified: Secondary | ICD-10-CM | POA: Diagnosis not present

## 2015-12-31 DIAGNOSIS — E7800 Pure hypercholesterolemia, unspecified: Secondary | ICD-10-CM | POA: Diagnosis not present

## 2016-01-13 DIAGNOSIS — Z923 Personal history of irradiation: Secondary | ICD-10-CM | POA: Diagnosis not present

## 2016-01-13 DIAGNOSIS — Z8669 Personal history of other diseases of the nervous system and sense organs: Secondary | ICD-10-CM | POA: Diagnosis not present

## 2016-01-13 DIAGNOSIS — G500 Trigeminal neuralgia: Secondary | ICD-10-CM | POA: Diagnosis not present

## 2016-01-13 DIAGNOSIS — R51 Headache: Secondary | ICD-10-CM | POA: Diagnosis not present

## 2016-01-13 DIAGNOSIS — Z79899 Other long term (current) drug therapy: Secondary | ICD-10-CM | POA: Diagnosis not present

## 2016-01-20 ENCOUNTER — Other Ambulatory Visit: Payer: Self-pay | Admitting: Gastroenterology

## 2016-01-20 DIAGNOSIS — K5909 Other constipation: Secondary | ICD-10-CM | POA: Diagnosis not present

## 2016-01-20 DIAGNOSIS — K2950 Unspecified chronic gastritis without bleeding: Secondary | ICD-10-CM | POA: Diagnosis not present

## 2016-01-20 DIAGNOSIS — D509 Iron deficiency anemia, unspecified: Secondary | ICD-10-CM | POA: Diagnosis not present

## 2016-01-26 ENCOUNTER — Ambulatory Visit
Admission: RE | Admit: 2016-01-26 | Discharge: 2016-01-26 | Disposition: A | Discharge status: Home / Self Care | Payer: PPO | Source: Ambulatory Visit | Attending: Gastroenterology | Admitting: Gastroenterology

## 2016-01-26 DIAGNOSIS — K5900 Constipation, unspecified: Secondary | ICD-10-CM | POA: Diagnosis not present

## 2016-01-26 DIAGNOSIS — D509 Iron deficiency anemia, unspecified: Secondary | ICD-10-CM | POA: Diagnosis not present

## 2016-01-28 DIAGNOSIS — I6523 Occlusion and stenosis of bilateral carotid arteries: Secondary | ICD-10-CM | POA: Diagnosis not present

## 2016-01-28 DIAGNOSIS — I2510 Atherosclerotic heart disease of native coronary artery without angina pectoris: Secondary | ICD-10-CM | POA: Diagnosis not present

## 2016-01-28 DIAGNOSIS — I714 Abdominal aortic aneurysm, without rupture: Secondary | ICD-10-CM | POA: Diagnosis not present

## 2016-01-28 DIAGNOSIS — E782 Mixed hyperlipidemia: Secondary | ICD-10-CM | POA: Diagnosis not present

## 2016-03-24 ENCOUNTER — Encounter: Payer: Self-pay | Admitting: Urology

## 2016-03-24 ENCOUNTER — Ambulatory Visit: Payer: PPO | Admitting: Urology

## 2016-03-24 VITALS — BP 141/75 | HR 76 | Ht 67.0 in | Wt 224.0 lb

## 2016-03-24 DIAGNOSIS — C679 Malignant neoplasm of bladder, unspecified: Secondary | ICD-10-CM | POA: Diagnosis not present

## 2016-03-24 LAB — MICROSCOPIC EXAMINATION
BACTERIA UA: NONE SEEN
WBC, UA: NONE SEEN /hpf (ref 0–?)

## 2016-03-24 LAB — URINALYSIS, COMPLETE
BILIRUBIN UA: NEGATIVE
Glucose, UA: NEGATIVE
Ketones, UA: NEGATIVE
LEUKOCYTES UA: NEGATIVE
NITRITE UA: NEGATIVE
PH UA: 5.5 (ref 5.0–7.5)
Protein, UA: NEGATIVE
RBC UA: NEGATIVE
Specific Gravity, UA: 1.02 (ref 1.005–1.030)
Urobilinogen, Ur: 0.2 mg/dL (ref 0.2–1.0)

## 2016-03-24 MED ORDER — LIDOCAINE HCL 2 % EX GEL
1.0000 "application " | Freq: Once | CUTANEOUS | Status: AC
  Administered 2016-03-24: 1 via URETHRAL

## 2016-03-24 MED ORDER — CIPROFLOXACIN HCL 500 MG PO TABS
500.0000 mg | ORAL_TABLET | Freq: Once | ORAL | Status: AC
  Administered 2016-03-24: 500 mg via ORAL

## 2016-03-24 NOTE — Progress Notes (Signed)
8:51 AM  03/25/16   [name] Aug 04, 1945 [id number]  Referring provider: Ricardo Jericho, NP [address] [place], [place] [address]  Chief Complaint  Patient presents with  . Cysto    HPI: [name] with history of painless gross hematuria s/p hematuria work up revealing a very small papillary bladder tumor just proximal to the LEFT UO. 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 He returns today for his routine 79month surveillance cystoscopy.  No significant voiding symptoms. Denies UTIs or kidney stones.   CT Urogram from 06/20/15 shows no evidence of GU pathology.  Last cysto 08/2015 negative.     PMH: Past Medical History:  Diagnosis Date  . Anxiety   . Arteriosclerosis of coronary artery 11/16/2011   Overview:  Stent 10/2011 stent rca 2015 with collaterals to lad which is chronically occluded   . Benign enlargement of prostate   . Benign essential HTN 06/11/2014  . Benign prostatic hypertrophy without urinary obstruction 07/31/2014  . Bilateral cataracts 05/16/2013   Overview:  Dr. Tobe Sos [facility]    . BP (high blood pressure) 11/09/2012  . Cancer ([place])    skin  . Carotid artery narrowing 02/08/2014  . Depression   . Detrusor hypertrophy   . Diabetes ([place])   . Diabetes mellitus, type 2 ([place]) 12/12/2012  . Esophageal reflux   . Fothergill's neuralgia 08/07/2012   Overview:  [name]   . GERD (gastroesophageal reflux disease)   . Headache   . 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 Hearing loss in left ear   . Heart disease   . Hematuria   . Hemorrhoids   . Hypercholesteremia   . Lesion of bladder   . Myocardial infarct   . Presence of stent in coronary artery 11/09/2012  . Rectal bleeding   . Trigeminal neuralgia   . Valvular heart disease   . Vitamin D deficiency     Surgical History: Past Surgical History:  Procedure Laterality Date  .  APPENDECTOMY    . CARDIAC CATHETERIZATION    . CARDIAC CATHETERIZATION N/A 01/22/2015   Procedure: Left Heart Cath;  Surgeon: Corey Skains, MD;  Location: [place] [facility];  Service: Cardiovascular;  Laterality: N/A;  . CARDIAC CATHETERIZATION N/A 01/22/2015   Procedure: Coronary Stent Intervention;  Surgeon: Isaias Cowman, MD;  Location: [place] [facility];  Service: Cardiovascular;  Laterality: N/A;  . COLONOSCOPY WITH PROPOFOL N/A 12/08/2015   Procedure: COLONOSCOPY WITH PROPOFOL;  Surgeon: Lollie Sails, MD;  Location: [facility] ENDOSCOPY;  Service: Endoscopy;  Laterality: N/A;  . COLONOSCOPY WITH PROPOFOL N/A 12/09/2015   Procedure: COLONOSCOPY WITH PROPOFOL;  Surgeon: Lollie Sails, MD;  Location: [facility] ENDOSCOPY;  Service: Endoscopy;  Laterality: N/A;  . CORONARY STENT PLACEMENT  2015   x5  . ESOPHAGOGASTRODUODENOSCOPY (EGD) WITH PROPOFOL N/A 12/08/2015   Procedure: ESOPHAGOGASTRODUODENOSCOPY (EGD) WITH PROPOFOL;  Surgeon: Lollie Sails, MD;  Location: [facility] ENDOSCOPY;  Service: Endoscopy;  Laterality: N/A;  . HERNIA REPAIR    . kidney tumor remove    . urethral meatotomy      Home Medications:  Allergies as of 03/24/2016      Reactions   Ace Inhibitors Cough   Lyrica [pregabalin] Rash      Medication List       Accurate as of 03/24/16 11:59 PM. Always use your most recent med list.  aspirin 81 MG tablet Take 81 mg by mouth daily.   citalopram 40 MG tablet Commonly known as:  CELEXA Take 40 mg by mouth daily.   clopidogrel 75 MG tablet Commonly known as:  PLAVIX Take by mouth.   gabapentin 100 MG capsule Commonly known as:  NEURONTIN Take 100 mg by mouth 3 (three) times daily.   losartan 50 MG tablet Commonly known as:  COZAAR TAKE 1 TABLET (50 MG TOTAL) BY MOUTH ONCE DAILY.   nitroGLYCERIN 0.4 MG SL tablet Commonly known as:  NITROSTAT Place under the tongue.   Omega-3 1000 MG Caps Take 1 g by mouth. Reported on 03/21/2015     oxybutynin 15 MG 24 hr tablet Commonly known as:  DITROPAN XL Take 15 mg by mouth at bedtime.   pantoprazole 40 MG tablet Commonly known as:  PROTONIX Take 40 mg by mouth daily.   pravastatin 80 MG tablet Commonly known as:  PRAVACHOL TAKE 1 TABLET (80 MG TOTAL) BY MOUTH NIGHTLY.   RA BLOOD GLUCOSE MONITOR Devi by Does not apply route.   sildenafil 50 MG tablet Commonly known as:  VIAGRA Take 50 mg by mouth daily as needed for erectile dysfunction.   TRILEPTAL 150 MG tablet Generic drug:  OXcarbazepine Reported on 03/21/2015   VITAMIN D-1000 MAX ST 1000 units tablet Generic drug:  Cholecalciferol Take by mouth.       Allergies:  Allergies  Allergen Reactions  . Ace Inhibitors Cough  . Lyrica [Pregabalin] Rash    Family History: Family History  Problem Relation Age of Onset  . Kidney cancer Mother   . Prostate cancer Neg Hx     Social History:  reports that he quit smoking about 26 years ago. His smoking use included Cigarettes. He has never used smokeless tobacco. He reports that he drinks alcohol. He reports that he does not use drugs.   Physical Exam: BP (!) 141/75   Pulse 76   Ht 5\' 7"  (1.702 m)   Wt 224 lb (101.6 kg)   BMI 35.08 kg/m   Constitutional:  Alert and oriented, No acute distress. HEENT: [place] AT, moist mucus membranes.  Trachea midline, no masses. Cardiovascular: No clubbing, cyanosis, or edema. Respiratory: Normal respiratory effort, no increased work of breathing. GI: Abdomen is soft, nontender, nondistended, no abdominal masses GU: No CVA tenderness.  Normal urethral meatus.  Normal phallus.   Skin: No rashes, bruises or suspicious lesions. Neurologic: Grossly intact, no focal deficits, moving all 4 extremities. Psychiatric: Normal mood and affect.  Laboratory Data: Lab Results  Component Value Date   WBC 7.5 01/23/2015   HGB 13.5 01/23/2015   HCT 40.2 01/23/2015   MCV 76.8 (L) 01/23/2015   PLT 164 01/23/2015    Lab Results   Component Value Date   CREATININE 0.98 01/23/2015    Lab Results  Component Value Date   HGBA1C 6.8 (H) 05/22/2012    Urinalysis UA negative today  Cystoscopy Procedure Note  Patient identification was confirmed, informed consent was obtained, and patient was prepped using Betadine solution. Lidocaine jelly was administered per urethral meatus.   Preoperative abx where received prior to procedure.    Pre-Procedure: - Inspection reveals a normal caliber ureteral meatus.  Procedure: The flexible cystoscope was introduced without difficulty - No urethral strictures/lesions are present. - Normal prostate with minimal coaptation - Normal bladder neck - Bilateral ureteral orifices identified - Bladder mucosa reveals no ulcers, tumors, or lesions - No bladder stones - Moderate/severe  trabeculation  Retroflexion shows no remarkable median lobe.   Post-Procedure: - Patient tolerated the procedure well  Assessment & Plan:  71 year old male with history of low-grade TA bladder cancer diagnosed 12/2013 status post surveillance cystoscopy today without evidence of recurrence.  1. Malignant neoplasm of urinary bladder, unspecified site [facility]) NED today Continue q6 monthly cystoscopy for total of two years, then annually (until 08/2017) - Urinalysis, Complete   Return in about 6 months (around 09/21/2016).  [name], MD  [facility] [address] [place], [place] [address] [phone]

## 2016-03-30 ENCOUNTER — Other Ambulatory Visit: Payer: PPO

## 2016-03-31 ENCOUNTER — Other Ambulatory Visit: Payer: PPO | Admitting: Urology

## 2016-06-22 DIAGNOSIS — I10 Essential (primary) hypertension: Secondary | ICD-10-CM | POA: Diagnosis not present

## 2016-06-22 DIAGNOSIS — G250 Essential tremor: Secondary | ICD-10-CM | POA: Insufficient documentation

## 2016-06-22 DIAGNOSIS — E119 Type 2 diabetes mellitus without complications: Secondary | ICD-10-CM | POA: Diagnosis not present

## 2016-06-22 DIAGNOSIS — L989 Disorder of the skin and subcutaneous tissue, unspecified: Secondary | ICD-10-CM | POA: Diagnosis not present

## 2016-06-22 DIAGNOSIS — Z794 Long term (current) use of insulin: Secondary | ICD-10-CM | POA: Diagnosis not present

## 2016-06-22 DIAGNOSIS — E782 Mixed hyperlipidemia: Secondary | ICD-10-CM | POA: Diagnosis not present

## 2016-07-27 DIAGNOSIS — E782 Mixed hyperlipidemia: Secondary | ICD-10-CM | POA: Diagnosis not present

## 2016-07-27 DIAGNOSIS — I2510 Atherosclerotic heart disease of native coronary artery without angina pectoris: Secondary | ICD-10-CM | POA: Diagnosis not present

## 2016-07-27 DIAGNOSIS — N5201 Erectile dysfunction due to arterial insufficiency: Secondary | ICD-10-CM | POA: Diagnosis not present

## 2016-07-27 DIAGNOSIS — I714 Abdominal aortic aneurysm, without rupture: Secondary | ICD-10-CM | POA: Diagnosis not present

## 2016-09-20 DIAGNOSIS — L918 Other hypertrophic disorders of the skin: Secondary | ICD-10-CM | POA: Diagnosis not present

## 2016-09-20 DIAGNOSIS — L219 Seborrheic dermatitis, unspecified: Secondary | ICD-10-CM | POA: Diagnosis not present

## 2016-09-20 DIAGNOSIS — L578 Other skin changes due to chronic exposure to nonionizing radiation: Secondary | ICD-10-CM | POA: Diagnosis not present

## 2016-09-20 DIAGNOSIS — L570 Actinic keratosis: Secondary | ICD-10-CM | POA: Diagnosis not present

## 2016-09-20 DIAGNOSIS — L821 Other seborrheic keratosis: Secondary | ICD-10-CM | POA: Diagnosis not present

## 2016-09-20 DIAGNOSIS — L719 Rosacea, unspecified: Secondary | ICD-10-CM | POA: Diagnosis not present

## 2016-09-20 DIAGNOSIS — Z1283 Encounter for screening for malignant neoplasm of skin: Secondary | ICD-10-CM | POA: Diagnosis not present

## 2016-09-20 DIAGNOSIS — L820 Inflamed seborrheic keratosis: Secondary | ICD-10-CM | POA: Diagnosis not present

## 2016-09-20 DIAGNOSIS — D692 Other nonthrombocytopenic purpura: Secondary | ICD-10-CM | POA: Diagnosis not present

## 2016-09-22 ENCOUNTER — Encounter: Payer: Self-pay | Admitting: Urology

## 2016-09-22 ENCOUNTER — Ambulatory Visit: Payer: PPO | Admitting: Urology

## 2016-09-22 VITALS — BP 112/65 | HR 71 | Ht 67.0 in | Wt 224.0 lb

## 2016-09-22 DIAGNOSIS — C679 Malignant neoplasm of bladder, unspecified: Secondary | ICD-10-CM

## 2016-09-22 DIAGNOSIS — Z8551 Personal history of malignant neoplasm of bladder: Secondary | ICD-10-CM

## 2016-09-22 LAB — URINALYSIS, COMPLETE
Bilirubin, UA: NEGATIVE
GLUCOSE, UA: NEGATIVE
LEUKOCYTES UA: NEGATIVE
Nitrite, UA: NEGATIVE
PH UA: 5.5 (ref 5.0–7.5)
RBC, UA: NEGATIVE
Specific Gravity, UA: >1.03 — ABNORMAL HIGH (ref 1.005–1.030)
UUROB: 0.2 mg/dL (ref 0.2–1.0)

## 2016-09-22 LAB — MICROSCOPIC EXAMINATION
BACTERIA UA: NONE SEEN
Epithelial Cells (non renal): NONE SEEN /hpf (ref 0–10)
RBC, UA: NONE SEEN /hpf (ref 0–?)
WBC, UA: NONE SEEN /hpf (ref 0–?)

## 2016-09-22 MED ORDER — CIPROFLOXACIN HCL 500 MG PO TABS
500.0000 mg | ORAL_TABLET | Freq: Once | ORAL | Status: AC
  Administered 2016-09-22: 500 mg via ORAL

## 2016-09-22 MED ORDER — LIDOCAINE HCL 2 % EX GEL
1.0000 | Freq: Once | CUTANEOUS | Status: AC
Start: 2016-09-22 — End: 2016-09-22
  Administered 2016-09-22: 1 via URETHRAL

## 2016-09-22 NOTE — Progress Notes (Signed)
10:44 AM  09/22/16   [name] Dec 08, 1945 [id number]  Referring provider: Ricardo Jericho, NP [address] [place], [place] [address]  Chief Complaint  Patient presents with  . Cysto    HPI: [name] with history of painless gross hematuria s/p hematuria work up revealing a very small papillary bladder tumor just proximal to the LEFT UO. 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 He returns today for his routine 3month surveillance cystoscopy.  No significant voiding symptoms. Denies UTIs or kidney stones.   CT Urogram from 06/20/15 shows no evidence of GU pathology.    He denies any interval urinary symptoms. No gross hematuria.   PMH: Past Medical History:  Diagnosis Date  . Anxiety   . Arteriosclerosis of coronary artery 11/16/2011   Overview:  Stent 10/2011 stent rca 2015 with collaterals to lad which is chronically occluded   . Benign enlargement of prostate   . Benign essential HTN 06/11/2014  . Benign prostatic hypertrophy without urinary obstruction 07/31/2014  . Bilateral cataracts 05/16/2013   Overview:  Dr. Tobe Sos [facility]    . BP (high blood pressure) 11/09/2012  . Cancer ([place])    skin  . Carotid artery narrowing 02/08/2014  . Depression   . Detrusor hypertrophy   . Diabetes ([place])   . Diabetes mellitus, type 2 ([place]) 12/12/2012  . Esophageal reflux   . Fothergill's neuralgia 08/07/2012   Overview:  [facility]   . GERD (gastroesophageal reflux disease)   . Headache   . 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 Hearing loss in left ear   . Heart disease   . Hematuria   . Hemorrhoids   . Hypercholesteremia   . Lesion of bladder   . Myocardial infarct ([place])   . Presence of stent in coronary artery 11/09/2012  . Rectal bleeding   . Trigeminal neuralgia   . Valvular heart disease   . Vitamin D deficiency     Surgical History: Past Surgical  History:  Procedure Laterality Date  . APPENDECTOMY    . CARDIAC CATHETERIZATION    . CARDIAC CATHETERIZATION N/A 01/22/2015   Procedure: Left Heart Cath;  Surgeon: Corey Skains, MD;  Location: [place] [facility];  Service: Cardiovascular;  Laterality: N/A;  . CARDIAC CATHETERIZATION N/A 01/22/2015   Procedure: Coronary Stent Intervention;  Surgeon: Isaias Cowman, MD;  Location: [place] [facility];  Service: Cardiovascular;  Laterality: N/A;  . COLONOSCOPY WITH PROPOFOL N/A 12/08/2015   Procedure: COLONOSCOPY WITH PROPOFOL;  Surgeon: Lollie Sails, MD;  Location: [facility] ENDOSCOPY;  Service: Endoscopy;  Laterality: N/A;  . COLONOSCOPY WITH PROPOFOL N/A 12/09/2015   Procedure: COLONOSCOPY WITH PROPOFOL;  Surgeon: Lollie Sails, MD;  Location: [facility] END[facility];  Service: Endoscopy;  Laterality: N/A;  . CORONARY STENT PLACEMENT  2015   x5  . ESOPHAGOGASTRODUODENOSCOPY (EGD) WITH PROPOFOL N/A 12/08/2015   Procedure: ESOPHAGOGASTRODUODENOSCOPY (EGD) WITH PROPOFOL;  Surgeon: Lollie Sails, MD;  Location: [facility] ENDOSCOPY;  Service: Endoscopy;  Laterality: N/A;  . HERNIA REPAIR    . kidney tumor remove    . urethral meatotomy      Home Medications:  Allergies as of 09/22/2016      Reactions   Ace Inhibitors Cough   Lyrica [pregabalin] Rash      Medication List       Accurate as of 09/22/16 10:44 AM. Always use  your most recent med list.          aspirin 81 MG tablet Take 81 mg by mouth daily.   citalopram 40 MG tablet Commonly known as:  CELEXA Take 40 mg by mouth daily.   clopidogrel 75 MG tablet Commonly known as:  PLAVIX Take by mouth.   gabapentin 100 MG capsule Commonly known as:  NEURONTIN Take 100 mg by mouth 3 (three) times daily.   losartan 50 MG tablet Commonly known as:  COZAAR TAKE 1 TABLET (50 MG TOTAL) BY MOUTH ONCE DAILY.   nitroGLYCERIN 0.4 MG SL tablet Commonly known as:  NITROSTAT Place under the tongue.   Omega-3 1000 MG Caps Take  1 g by mouth. Reported on 03/21/2015   oxybutynin 15 MG 24 hr tablet Commonly known as:  DITROPAN XL Take 15 mg by mouth at bedtime.   pantoprazole 40 MG tablet Commonly known as:  PROTONIX Take 40 mg by mouth daily.   pravastatin 80 MG tablet Commonly known as:  PRAVACHOL TAKE 1 TABLET (80 MG TOTAL) BY MOUTH NIGHTLY.   RA BLOOD GLUCOSE MONITOR Devi by Does not apply route.   sildenafil 50 MG tablet Commonly known as:  VIAGRA Take 50 mg by mouth daily as needed for erectile dysfunction.   TRILEPTAL 150 MG tablet Generic drug:  OXcarbazepine Reported on 03/21/2015   VITAMIN D-1000 MAX ST 1000 units tablet Generic drug:  Cholecalciferol Take by mouth.       Allergies:  Allergies  Allergen Reactions  . Ace Inhibitors Cough  . Lyrica [Pregabalin] Rash    Family History: Family History  Problem Relation Age of Onset  . Kidney cancer Mother   . Prostate cancer Neg Hx     Social History:  reports that he quit smoking about 27 years ago. His smoking use included Cigarettes. He has never used smokeless tobacco. He reports that he drinks alcohol. He reports that he does not use drugs.   Physical Exam: BP 112/65   Pulse 71   Ht 5\' 7"  (1.702 m)   Wt 224 lb (101.6 kg)   BMI 35.08 kg/m   Constitutional:  Alert and oriented, No acute distress. HEENT: [place] AT, moist mucus membranes.  Trachea midline, no masses. Cardiovascular: No clubbing, cyanosis, or edema. Respiratory: Normal respiratory effort, no increased work of breathing. GI: Abdomen is soft, nontender, nondistended, no abdominal masses GU: No CVA tenderness.  Normal urethral meatus.  Normal phallus.   Skin: No rashes, bruises or suspicious lesions. Neurologic: Grossly intact, no focal deficits, moving all 4 extremities. Psychiatric: Normal mood and affect.  Laboratory Data: Lab Results  Component Value Date   WBC 7.5 01/23/2015   HGB 13.5 01/23/2015   HCT 40.2 01/23/2015   MCV 76.8 (L) 01/23/2015   PLT  164 01/23/2015    Lab Results  Component Value Date   CREATININE 0.98 01/23/2015    Lab Results  Component Value Date   HGBA1C 6.8 (H) 05/22/2012    Urinalysis UA reviewed, see Epic.  Cystoscopy Procedure Note  Patient identification was confirmed, informed consent was obtained, and patient was prepped using Betadine solution. Lidocaine jelly was administered per urethral meatus.   Preoperative abx where received prior to procedure.    Pre-Procedure: - Inspection reveals a normal caliber ureteral meatus.  Procedure: The flexible cystoscope was introduced without difficulty - No urethral strictures/lesions are present. - Normal prostate with minimal coaptation - Normal bladder neck - Bilateral ureteral orifices identified - Bladder mucosa reveals  no ulcers, tumors, or lesions - No bladder stones - Moderate/severe  trabeculation  Retroflexion shows no remarkable median lobe.   Post-Procedure: - Patient tolerated the procedure well  Assessment & Plan:  71 year old male with history of low-grade TA bladder cancer diagnosed 12/2013 status post surveillance cystoscopy today without evidence of recurrence.  1. Malignant neoplasm of urinary bladder, unspecified site [facility]) NED today Given lack of recurrence and greater than 2 years, we'll transition to annual cystoscopy for total of 5 years since dx - Urinalysis, Complete   Return in about 1 year (around 09/22/2017) for cystoscopy.  [name], MD  [facility] [address] [place], [place] [address] [phone]

## 2016-11-11 DIAGNOSIS — E782 Mixed hyperlipidemia: Secondary | ICD-10-CM | POA: Diagnosis not present

## 2016-11-11 DIAGNOSIS — I25118 Atherosclerotic heart disease of native coronary artery with other forms of angina pectoris: Secondary | ICD-10-CM | POA: Diagnosis not present

## 2016-11-11 DIAGNOSIS — I10 Essential (primary) hypertension: Secondary | ICD-10-CM | POA: Diagnosis not present

## 2016-11-11 DIAGNOSIS — R079 Chest pain, unspecified: Secondary | ICD-10-CM | POA: Diagnosis not present

## 2016-11-11 DIAGNOSIS — Z7689 Persons encountering health services in other specified circumstances: Secondary | ICD-10-CM | POA: Diagnosis not present

## 2016-12-01 DIAGNOSIS — I25118 Atherosclerotic heart disease of native coronary artery with other forms of angina pectoris: Secondary | ICD-10-CM | POA: Diagnosis not present

## 2016-12-01 DIAGNOSIS — Z01818 Encounter for other preprocedural examination: Secondary | ICD-10-CM | POA: Diagnosis not present

## 2016-12-14 ENCOUNTER — Ambulatory Visit: Admit: 2016-12-14 | Payer: PPO | Admitting: Internal Medicine

## 2016-12-14 SURGERY — LEFT HEART CATH AND CORONARY ANGIOGRAPHY
Anesthesia: Moderate Sedation | Laterality: Left

## 2016-12-22 DIAGNOSIS — E119 Type 2 diabetes mellitus without complications: Secondary | ICD-10-CM | POA: Diagnosis not present

## 2016-12-22 DIAGNOSIS — G4761 Periodic limb movement disorder: Secondary | ICD-10-CM | POA: Diagnosis not present

## 2016-12-22 DIAGNOSIS — I10 Essential (primary) hypertension: Secondary | ICD-10-CM | POA: Diagnosis not present

## 2016-12-22 DIAGNOSIS — G500 Trigeminal neuralgia: Secondary | ICD-10-CM | POA: Diagnosis not present

## 2016-12-22 DIAGNOSIS — E782 Mixed hyperlipidemia: Secondary | ICD-10-CM | POA: Diagnosis not present

## 2016-12-22 DIAGNOSIS — Z23 Encounter for immunization: Secondary | ICD-10-CM | POA: Diagnosis not present

## 2016-12-22 DIAGNOSIS — R5383 Other fatigue: Secondary | ICD-10-CM | POA: Diagnosis not present

## 2016-12-29 DIAGNOSIS — G4761 Periodic limb movement disorder: Secondary | ICD-10-CM | POA: Diagnosis not present

## 2016-12-29 DIAGNOSIS — G8929 Other chronic pain: Secondary | ICD-10-CM | POA: Insufficient documentation

## 2016-12-29 DIAGNOSIS — M545 Low back pain: Secondary | ICD-10-CM

## 2017-02-18 DIAGNOSIS — I714 Abdominal aortic aneurysm, without rupture: Secondary | ICD-10-CM | POA: Diagnosis not present

## 2017-02-18 DIAGNOSIS — R0602 Shortness of breath: Secondary | ICD-10-CM | POA: Insufficient documentation

## 2017-02-18 DIAGNOSIS — I10 Essential (primary) hypertension: Secondary | ICD-10-CM | POA: Diagnosis not present

## 2017-02-18 DIAGNOSIS — I2510 Atherosclerotic heart disease of native coronary artery without angina pectoris: Secondary | ICD-10-CM | POA: Diagnosis not present

## 2017-04-17 IMAGING — CR DG SMALL BOWEL
10 series · 10 of 10 positions shown · non-contrast
Comparison: CT 06/11/2015.

CLINICAL DATA: Chronic constipation.  Left lower quadrant pain.

EXAM:
SMALL BOWEL SERIES
TECHNIQUE: Following ingestion of thin barium, serial small bowel images were
obtained including spot views of the terminal ileum.
FLUOROSCOPY TIME:  Fluoroscopy Time:  0 minutes 48 seconds
Radiation Exposure Index (if provided by the fluoroscopic device):
104.5
Number of Acquired Spot Images: 10

[t abdomen supine (1 of 5)]
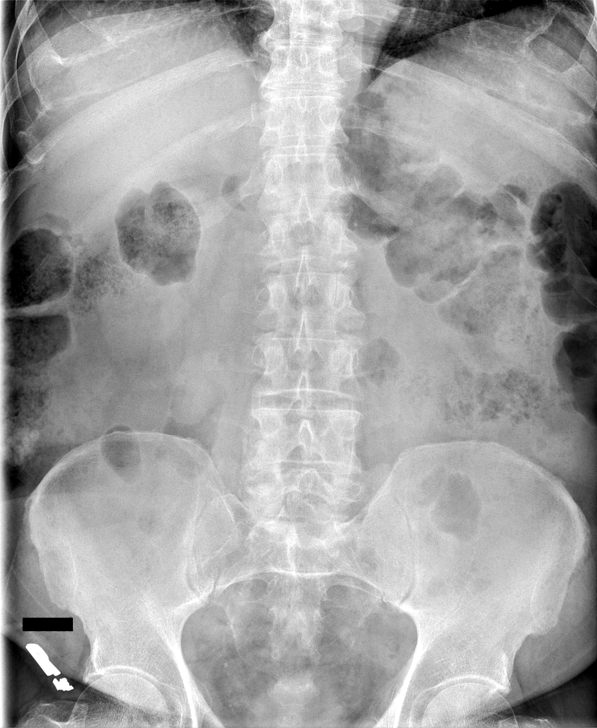

[t abdomen supine (2 of 5)]
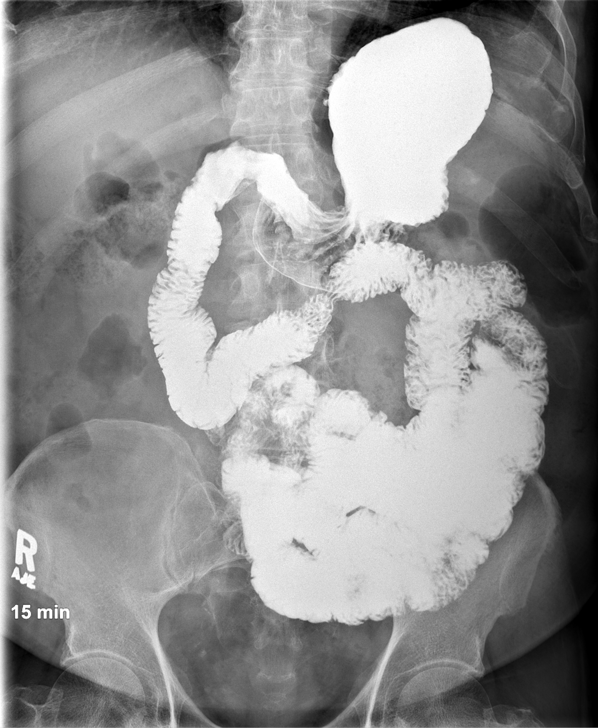

[t abdomen supine (3 of 5)]
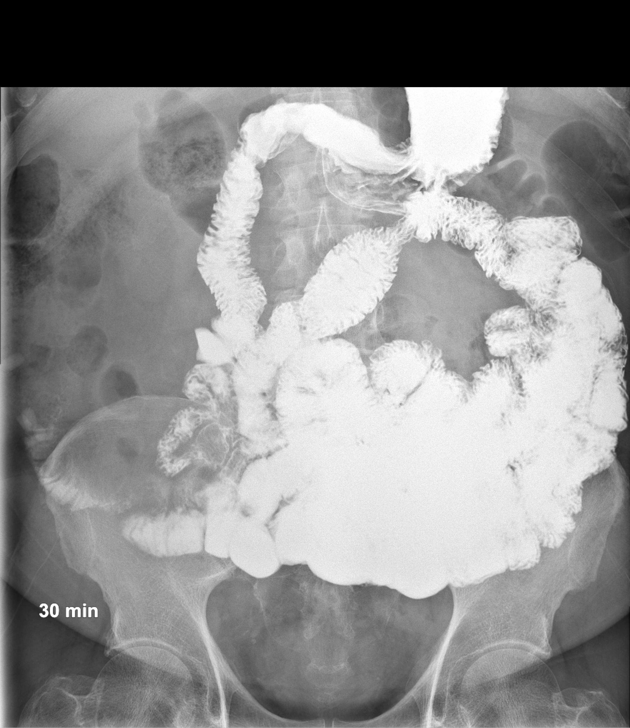

[t abdomen supine (4 of 5)]
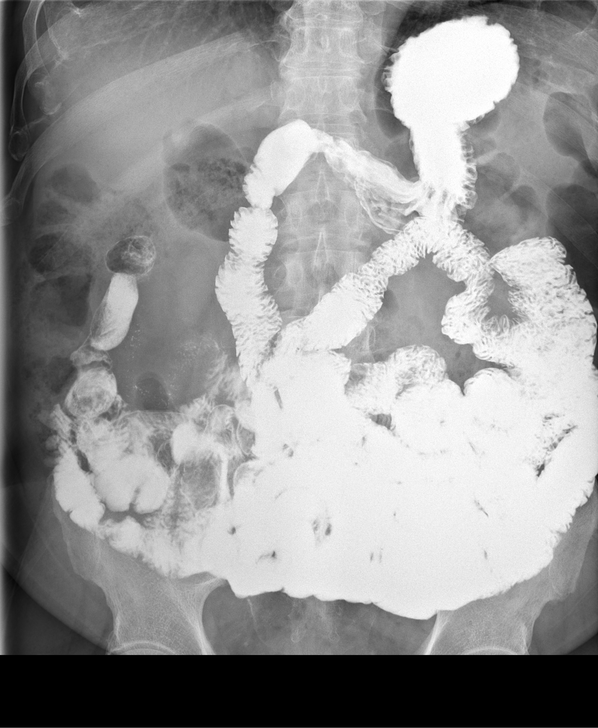

[t abdomen supine (5 of 5)]
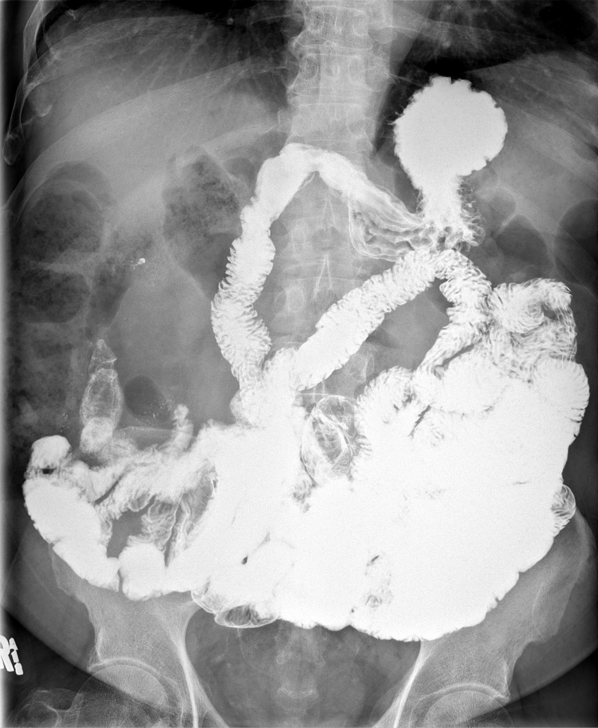

[fluoro_barium singleshot_bb (1 of 5)]
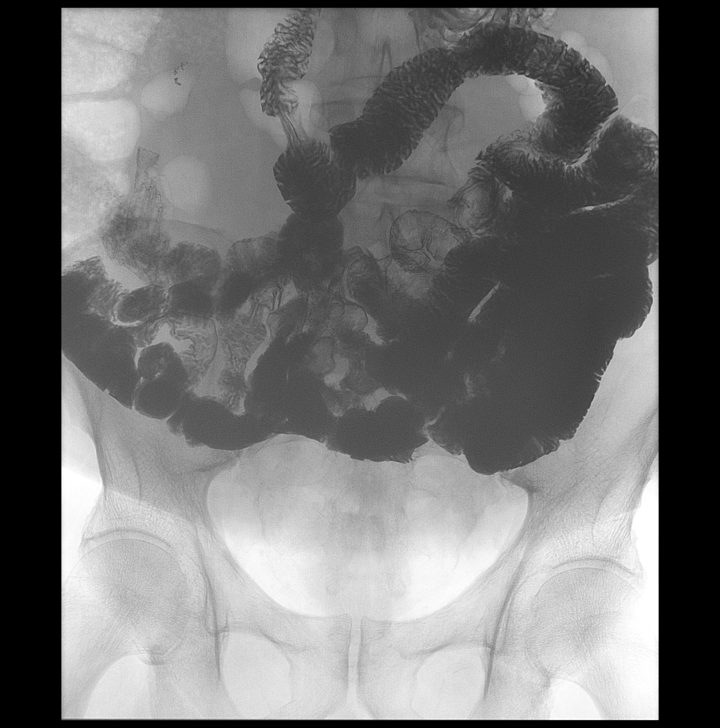

[fluoro_barium singleshot_bb (2 of 5)]
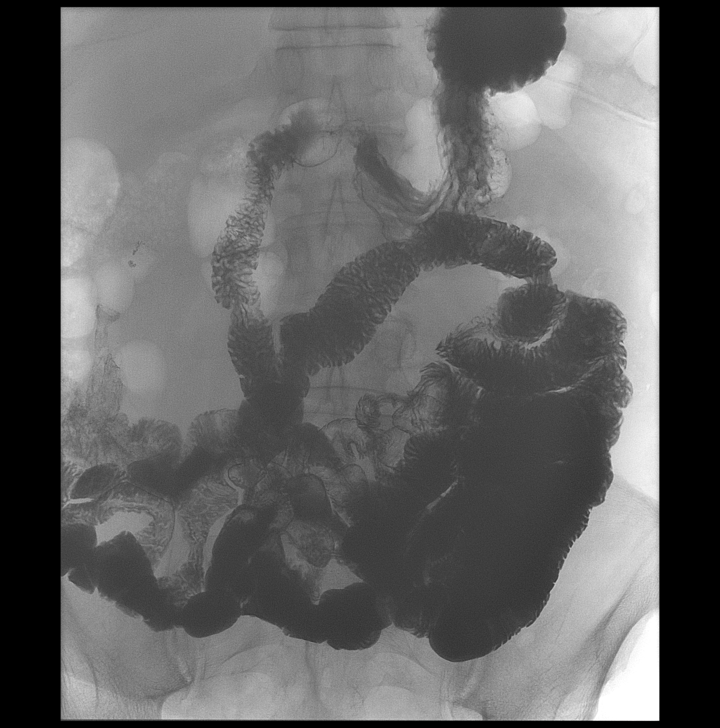

[fluoro_barium singleshot_bb (3 of 5)]
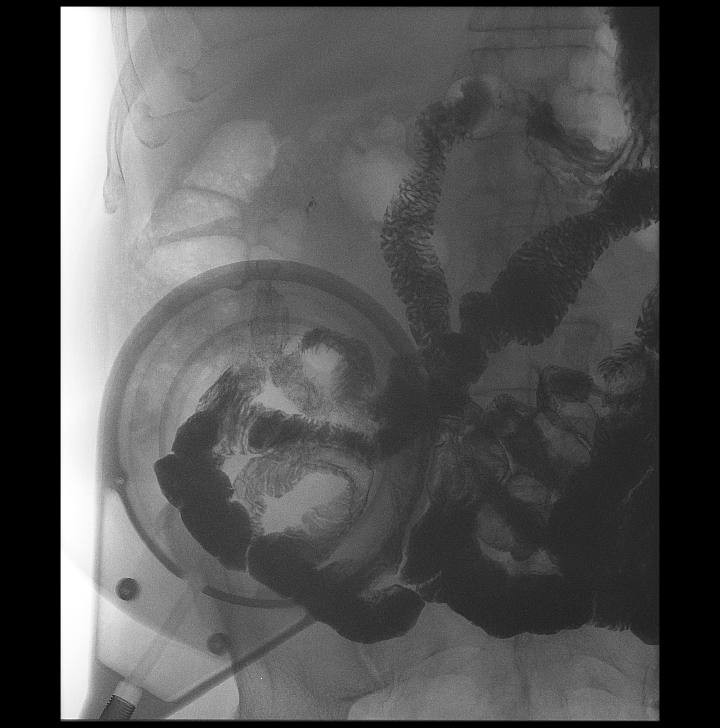

[fluoro_barium singleshot_bb (4 of 5)]
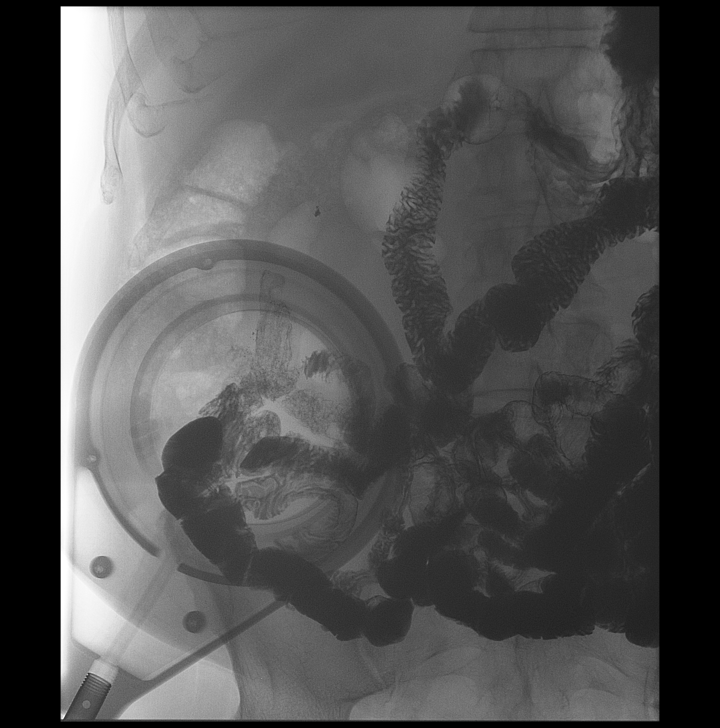

[fluoro_barium singleshot_bb (5 of 5)]
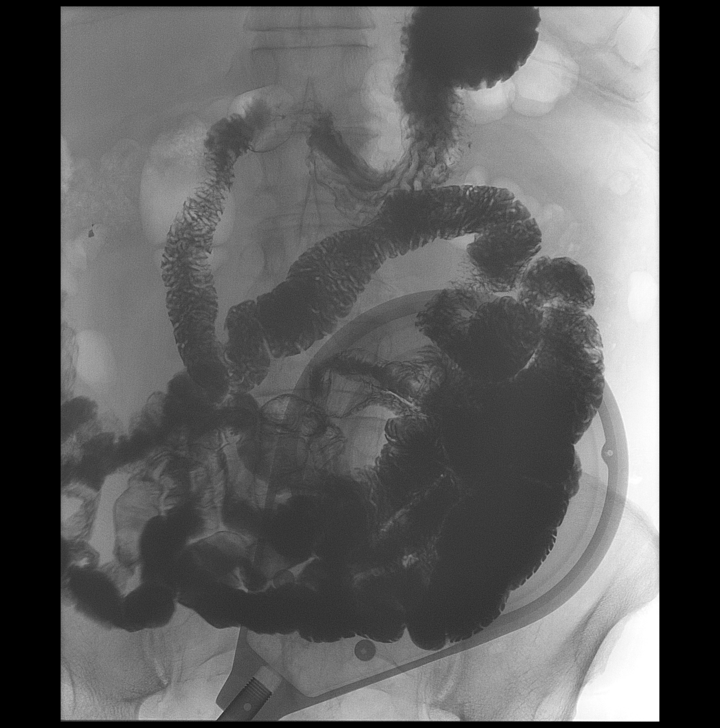

[10 of 10 positions shown; findings below may reference images not displayed]

FINDINGS: Small-bowel fold thickness and caliber normal. Transit time normal.
No focal obstructing abnormality identified. No mass lesion.
Terminal ileal region is normal.
IMPRESSION: Negative exam.

## 2017-04-25 ENCOUNTER — Telehealth: Payer: Self-pay | Admitting: Urology

## 2017-04-25 NOTE — Telephone Encounter (Signed)
I do not see any documentation that I started him on this medication.  When did you first start taking this medication and who prescribes it for him?

## 2017-04-25 NOTE — Telephone Encounter (Signed)
Patient said that he can no longer afford the oxybutynin and wants to know if there is something else he can take instead?  Thanks, Sharyn Lull

## 2017-04-26 NOTE — Telephone Encounter (Signed)
Pt states he started medication about 10 years ago & was prescribed by a doctor in [place]. Currently prescribed by Eulogio Bear.

## 2017-04-26 NOTE — Telephone Encounter (Signed)
Okay.  Oxybutynin can be broken down into 3 times daily dose, 5 mg 3 times daily is significantly less expensive than the extended dose.  This would be my initial recommendation if tolerated.  [name], MD

## 2017-04-28 NOTE — Telephone Encounter (Signed)
Called no answer

## 2017-05-02 ENCOUNTER — Other Ambulatory Visit: Payer: Self-pay | Admitting: Radiology

## 2017-05-02 DIAGNOSIS — R350 Frequency of micturition: Secondary | ICD-10-CM

## 2017-05-02 MED ORDER — OXYBUTYNIN CHLORIDE 5 MG PO TABS: 5.0000 mg | ORAL_TABLET | Freq: Three times a day (TID) | ORAL | 0 refills | Status: DC

## 2017-05-02 NOTE — Telephone Encounter (Signed)
Pt returned call. Advised of Dr Cherrie Gauze note below. Pt wants to try oxybutynin 5mg  three times daily. Script sent to pharmacy. Pt aware with no questions at this time.

## 2017-06-07 ENCOUNTER — Other Ambulatory Visit: Payer: Self-pay

## 2017-06-07 DIAGNOSIS — R350 Frequency of micturition: Secondary | ICD-10-CM

## 2017-06-07 MED ORDER — OXYBUTYNIN CHLORIDE 5 MG PO TABS: 5.0000 mg | ORAL_TABLET | Freq: Three times a day (TID) | ORAL | 0 refills | Status: DC

## 2017-07-07 ENCOUNTER — Other Ambulatory Visit: Payer: Self-pay

## 2017-07-07 DIAGNOSIS — R350 Frequency of micturition: Secondary | ICD-10-CM

## 2017-07-07 MED ORDER — OXYBUTYNIN CHLORIDE 5 MG PO TABS: 5.0000 mg | ORAL_TABLET | Freq: Three times a day (TID) | ORAL | 1 refills | Status: DC

## 2017-09-21 ENCOUNTER — Other Ambulatory Visit: Payer: Self-pay | Admitting: Family Medicine

## 2017-09-21 ENCOUNTER — Ambulatory Visit
Admission: RE | Admit: 2017-09-21 | Discharge: 2017-09-21 | Disposition: A | Discharge status: Home / Self Care | Payer: Medicare HMO | Source: Ambulatory Visit | Attending: Family Medicine | Admitting: Family Medicine

## 2017-09-21 DIAGNOSIS — I700 Atherosclerosis of aorta: Secondary | ICD-10-CM | POA: Diagnosis not present

## 2017-09-21 DIAGNOSIS — M545 Low back pain, unspecified: Secondary | ICD-10-CM

## 2017-09-21 DIAGNOSIS — R918 Other nonspecific abnormal finding of lung field: Secondary | ICD-10-CM | POA: Insufficient documentation

## 2017-09-21 DIAGNOSIS — D649 Anemia, unspecified: Secondary | ICD-10-CM | POA: Insufficient documentation

## 2017-09-21 DIAGNOSIS — G8929 Other chronic pain: Secondary | ICD-10-CM

## 2017-09-21 DIAGNOSIS — R05 Cough: Secondary | ICD-10-CM

## 2017-09-21 DIAGNOSIS — R053 Chronic cough: Secondary | ICD-10-CM

## 2017-09-21 DIAGNOSIS — M47896 Other spondylosis, lumbar region: Secondary | ICD-10-CM | POA: Insufficient documentation

## 2017-09-22 DIAGNOSIS — J410 Simple chronic bronchitis: Secondary | ICD-10-CM | POA: Insufficient documentation

## 2017-09-23 ENCOUNTER — Other Ambulatory Visit: Payer: PPO | Admitting: Urology

## 2017-09-23 ENCOUNTER — Encounter: Payer: Self-pay | Admitting: Urology

## 2017-09-30 ENCOUNTER — Telehealth: Payer: Self-pay | Admitting: Urology

## 2017-09-30 DIAGNOSIS — R350 Frequency of micturition: Secondary | ICD-10-CM

## 2017-09-30 MED ORDER — OXYBUTYNIN CHLORIDE 5 MG PO TABS: 5.0000 mg | ORAL_TABLET | Freq: Three times a day (TID) | ORAL | 1 refills | Status: DC

## 2017-09-30 NOTE — Telephone Encounter (Signed)
Pt needs a refill for oxybutnin.  [facility] [place].

## 2017-10-24 ENCOUNTER — Other Ambulatory Visit: Payer: Self-pay | Admitting: Gastroenterology

## 2017-10-24 DIAGNOSIS — K219 Gastro-esophageal reflux disease without esophagitis: Secondary | ICD-10-CM

## 2017-10-24 DIAGNOSIS — R1310 Dysphagia, unspecified: Secondary | ICD-10-CM

## 2017-10-27 ENCOUNTER — Ambulatory Visit
Admission: RE | Admit: 2017-10-27 | Discharge: 2017-10-27 | Disposition: A | Discharge status: Home / Self Care | Payer: Medicare HMO | Source: Ambulatory Visit | Attending: Gastroenterology | Admitting: Gastroenterology

## 2017-10-27 DIAGNOSIS — R1310 Dysphagia, unspecified: Secondary | ICD-10-CM | POA: Diagnosis present

## 2017-10-27 DIAGNOSIS — K219 Gastro-esophageal reflux disease without esophagitis: Secondary | ICD-10-CM

## 2017-11-02 ENCOUNTER — Telehealth: Payer: Self-pay | Admitting: Urology

## 2017-11-02 DIAGNOSIS — R350 Frequency of micturition: Secondary | ICD-10-CM

## 2017-11-02 NOTE — Telephone Encounter (Signed)
Pt calls office asking for a refill on his Oxybutinin, pt states he doesn't want to take 3 times a day, he wants the 1 time a day dose, 90 supply that was sent on 09/30/2017 he is almost out, only has 6 pills, [facility] [place]. Please advise pt.

## 2017-11-03 NOTE — Telephone Encounter (Signed)
He can go to once daily, 15 mq XL but this tends to be much more expensive.  Please prescribe.    [name], MD

## 2017-11-03 NOTE — Telephone Encounter (Signed)
Please advise, pt no longer wants to take tid, he wants to go to qd.

## 2017-11-04 MED ORDER — OXYBUTYNIN CHLORIDE ER 15 MG PO TB24: 15.0000 mg | ORAL_TABLET | Freq: Every day | ORAL | 11 refills | Status: DC

## 2017-11-04 NOTE — Telephone Encounter (Signed)
Called pt informed him of the medication change. Pt states that he is aware of cost increase as he was on this medication previously. RX sent.

## 2017-11-04 NOTE — Addendum Note (Signed)
Addended by: Donalee Citrin on: 11/04/2017 05:02 PM   Modules accepted: Orders

## 2017-11-10 ENCOUNTER — Other Ambulatory Visit: Payer: Medicare HMO | Admitting: Urology

## 2017-11-15 ENCOUNTER — Ambulatory Visit (INDEPENDENT_AMBULATORY_CARE_PROVIDER_SITE_OTHER): Payer: Medicare HMO | Admitting: Urology

## 2017-11-15 ENCOUNTER — Encounter: Payer: Self-pay | Admitting: Urology

## 2017-11-15 VITALS — BP 111/67 | HR 78 | Ht 67.0 in | Wt 224.0 lb

## 2017-11-15 DIAGNOSIS — Z8551 Personal history of malignant neoplasm of bladder: Secondary | ICD-10-CM

## 2017-11-15 DIAGNOSIS — R350 Frequency of micturition: Secondary | ICD-10-CM

## 2017-11-15 LAB — URINALYSIS, COMPLETE
BILIRUBIN UA: NEGATIVE
GLUCOSE, UA: NEGATIVE
Ketones, UA: NEGATIVE
Leukocytes, UA: NEGATIVE
Nitrite, UA: NEGATIVE
PH UA: 5.5 (ref 5.0–7.5)
Protein, UA: NEGATIVE
Specific Gravity, UA: 1.02 (ref 1.005–1.030)
Urobilinogen, Ur: 0.2 mg/dL (ref 0.2–1.0)

## 2017-11-15 MED ORDER — MIRABEGRON ER 25 MG PO TB24: 25.0000 mg | ORAL_TABLET | Freq: Every day | ORAL | 11 refills | Status: DC

## 2017-11-15 NOTE — Progress Notes (Signed)
   11/15/17  CC:  Chief Complaint  Patient presents with  . Cysto    HPI: 72 yo M with history of painless gross hematuria s/p hematuria work up revealing a very small papillary bladder tumor just proximal to the LEFT UO. 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 He returns today for his surveillance cystoscopy.  No significant voiding symptoms. Denies UTIs or kidney stones.   CT Urogram from 06/20/15 shows no evidence of GU pathology.    He denies any interval urinary symptoms. No gross hematuria.  He called the office several months ago asking for refill for oxybutynin which is never previously discussed or prescribed by me, presumably his previous urologist for urinary urgency and urge incontinence.  Notably today, he somewhat confused about whether or not this was helpful as well as cause.  It is recently started on Namenda for cognitive issues.  He reports that he has been having trouble with his memory.  He has not yet started this medication.   Blood pressure 111/67, pulse 78, height 5\' 7"  (1.702 m), weight 224 lb (101.6 kg). NED. A&Ox3.   No respiratory distress   Abd soft, NT, ND Normal phallus with bilateral descended testicles  Cystoscopy Procedure Note  Patient identification was confirmed, informed consent was obtained, and patient was prepped using Betadine solution.  Lidocaine jelly was administered per urethral meatus.    Preoperative abx where received prior to procedure.     Pre-Procedure: - Inspection reveals a normal caliber ureteral meatus.  Procedure: The flexible cystoscope was introduced without difficulty - No urethral strictures/lesions are present. - Normal prostate  - Normal bladder neck - Bilateral ureteral orifices identified - Bladder mucosa  reveals no ulcers, tumors, or lesions - No bladder stones - Moderate trabeculation  Retroflexion  unremarkable   Post-Procedure: - Patient tolerated the procedure well  Assessment/ Plan:  1. History of bladder cancer NED Continue annual surveillance cystoscopy - Urinalysis, Complete  2. Urinary frequency Previously prescribed oxybutynin Given his memory and attention issues, this medication is contraindicated He is given prescriptions of beta 3 agonist, Myrbetriq 25 mg x 4 weeks today Prescription was sent to pharmacy He will let us know if this medication is helpful by phone or message, adjustments to be made based on his feed back  Return in about 1 year (around 11/16/2018) for cysto or sooner as needed.   [name], MD

## 2017-11-17 ENCOUNTER — Telehealth: Payer: Self-pay | Admitting: Urology

## 2017-11-17 DIAGNOSIS — R42 Dizziness and giddiness: Secondary | ICD-10-CM | POA: Insufficient documentation

## 2017-11-17 NOTE — Telephone Encounter (Signed)
Patient called the office today.  He was prescribed Myrbetriq on 9/18, but it is too expensive.  He is wanting to know if he can take [place] instead.  Please call him at [phone]

## 2017-11-18 NOTE — Telephone Encounter (Signed)
No, I cannot prescribe this medication.  He is having issues with his memory and is recently started on namenda.  As such, anticholinergics are contraindicated.  We discussed this as it is appointment.  I believe he may have forgotten.  [name], MD

## 2017-11-21 NOTE — Telephone Encounter (Signed)
Patient notified

## 2017-11-21 NOTE — Telephone Encounter (Signed)
Left pt mess to call 

## 2017-11-25 ENCOUNTER — Encounter: Payer: Self-pay | Admitting: Urology

## 2017-11-25 ENCOUNTER — Ambulatory Visit (INDEPENDENT_AMBULATORY_CARE_PROVIDER_SITE_OTHER): Payer: Medicare HMO | Admitting: Urology

## 2017-11-25 VITALS — BP 145/83 | HR 67 | Ht 67.0 in | Wt 224.0 lb

## 2017-11-25 DIAGNOSIS — R350 Frequency of micturition: Secondary | ICD-10-CM | POA: Diagnosis not present

## 2017-11-25 DIAGNOSIS — Z8551 Personal history of malignant neoplasm of bladder: Secondary | ICD-10-CM

## 2017-11-25 NOTE — H&P (View-Only) (Signed)
11/25/2017 12:49 PM   [name] [birth year] [id number]  Referring provider: Ricardo Jericho, NP [address] [place], [place] [address]  Chief Complaint  Patient presents with  . Urinary Incontinence    HPI: 72 year old male with a history of bladder cancer status post recent cystoscopy on 11/15/2017 who returns primarily to discuss his voiding symptoms.  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  He underwent TURBT on 12/2013 with pathology consistent with low-grade TA cc.  He is been on surveillance since without recurrence.  He is now on annual cystoscopy.  He was previously treated with oxybutynin by another provider.  He called our office several months ago requesting refill for oxybutynin for refractory urgency and urge incontinence.  At last visit, he was discovered to have new onset dementia and has been recently started on Namenda.  As such, anticholinergics are contraindicated.  10 days ago, he was given samples of Myrbetriq 25 mg which he has been taking.  He does not think that the have helped.  He does report that he is taking this medication in the past and it was previously helpful.  He had difficulty affording the medication.  Notably today, he somewhat confused about what he is taken in the affect.  He does not recall our conversation at the time of cystoscopy 10 days ago.   PMH: Past Medical History:  Diagnosis Date  . Anxiety   . Arteriosclerosis of coronary artery 11/16/2011   Overview:  Stent 10/2011 stent rca 2015 with collaterals to lad which is chronically occluded   . Benign enlargement of prostate   . Benign essential HTN 06/11/2014  . Benign prostatic hypertrophy without urinary obstruction 07/31/2014  . Bilateral cataracts 05/16/2013   Overview:  Dr. Tobe Sos [facility]    . BP (high blood pressure) 11/09/2012  . Cancer ([place])    skin  . Carotid artery  narrowing 02/08/2014  . Depression   . Detrusor hypertrophy   . Diabetes ([place])   . Diabetes mellitus, type 2 ([place]) 12/12/2012  . Esophageal reflux   . Fothergill's neuralgia 08/07/2012   Overview:  [facility]   . GERD (gastroesophageal reflux disease)   . Headache   . 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 Hearing loss in left ear   . Heart disease   . Hematuria   . Hemorrhoids   . Hypercholesteremia   . Lesion of bladder   . Myocardial infarct ([place])   . Presence of stent in coronary artery 11/09/2012  . Rectal bleeding   . Trigeminal neuralgia   . Valvular heart disease   . Vitamin D deficiency     Surgical History: Past Surgical History:  Procedure Laterality Date  . APPENDECTOMY    . CARDIAC CATHETERIZATION    . CARDIAC CATHETERIZATION N/A 01/22/2015   Procedure: Left Heart Cath;  Surgeon: Corey Skains, MD;  Location: [place] [facility];  Service: Cardiovascular;  Laterality: N/A;  . CARDIAC CATHETERIZATION N/A 01/22/2015   Procedure: Coronary Stent Intervention;  Surgeon: Isaias Cowman, MD;  Location: [place] [facility];  Service: Cardiovascular;  Laterality: N/A;  . COLONOSCOPY WITH PROPOFOL N/A 12/08/2015   Procedure: COLONOSCOPY WITH PROPOFOL;  Surgeon: Lollie Sails, MD;  Location: [facility] ENDOSCOPY;  Service: Endoscopy;  Laterality: N/A;  . COLONOSCOPY WITH PROPOFOL N/A 12/09/2015   Procedure: COLONOSCOPY WITH PROPOFOL;  Surgeon: Lollie Sails, MD;  Location:  [place] ENDOSCOPY;  Service: Endoscopy;  Laterality: N/A;  . CORONARY STENT PLACEMENT  2015   x5  . ESOPHAGOGASTRODUODENOSCOPY (EGD) WITH PROPOFOL N/A 12/08/2015   Procedure: ESOPHAGOGASTRODUODENOSCOPY (EGD) WITH PROPOFOL;  Surgeon: Lollie Sails, MD;  Location: [facility] ENDOSCOPY;  Service: Endoscopy;  Laterality: N/A;  . HERNIA REPAIR    . kidney tumor remove    . urethral meatotomy      Home Medications:  Allergies as of 11/25/2017      Reactions   Ace Inhibitors Cough   Lyrica  [pregabalin] Rash      Medication List        Accurate as of 11/25/17 11:59 PM. Always use your most recent med list.          aspirin 81 MG tablet Take 81 mg by mouth daily.   citalopram 40 MG tablet Commonly known as:  CELEXA Take 40 mg by mouth daily.   clopidogrel 75 MG tablet Commonly known as:  PLAVIX Take by mouth.   gabapentin 100 MG capsule Commonly known as:  NEURONTIN Take 100 mg by mouth 3 (three) times daily.   losartan 50 MG tablet Commonly known as:  COZAAR TAKE 1 TABLET (50 MG TOTAL) BY MOUTH ONCE DAILY.   memantine 5 MG tablet Commonly known as:  NAMENDA Take by mouth.   mirabegron ER 25 MG Tb24 tablet Commonly known as:  MYRBETRIQ Take 1 tablet (25 mg total) by mouth daily.   nitroGLYCERIN 0.4 MG SL tablet Commonly known as:  NITROSTAT Place under the tongue.   Omega-3 1000 MG Caps Take 1 g by mouth. Reported on 03/21/2015   pantoprazole 40 MG tablet Commonly known as:  PROTONIX Take 40 mg by mouth daily.   pravastatin 80 MG tablet Commonly known as:  PRAVACHOL TAKE 1 TABLET (80 MG TOTAL) BY MOUTH NIGHTLY.   RA BLOOD GLUCOSE MONITOR Devi by Does not apply route.   sildenafil 50 MG tablet Commonly known as:  VIAGRA Take 50 mg by mouth daily as needed for erectile dysfunction.   TRILEPTAL 150 MG tablet Generic drug:  OXcarbazepine Reported on 03/21/2015   VITAMIN D-1000 MAX ST 1000 units tablet Generic drug:  Cholecalciferol Take by mouth.       Allergies:  Allergies  Allergen Reactions  . Ace Inhibitors Cough  . Lyrica [Pregabalin] Rash    Family History: Family History  Problem Relation Age of Onset  . Kidney cancer Mother   . Prostate cancer Neg Hx     Social History:  reports that he quit smoking about 28 years ago. His smoking use included cigarettes. He has never used smokeless tobacco. He reports that he drinks alcohol. He reports that he does not use drugs.  ROS: UROLOGY Frequent Urination?: Yes Hard to  postpone urination?: No Burning/pain with urination?: No Get up at night to urinate?: No Leakage of urine?: No Urine stream starts and stops?: No Trouble starting stream?: No Do you have to strain to urinate?: No Blood in urine?: No Urinary tract infection?: No Sexually transmitted disease?: No Injury to kidneys or bladder?: No Painful intercourse?: No Weak stream?: No Erection problems?: No Penile pain?: No  Gastrointestinal Nausea?: No Vomiting?: No Indigestion/heartburn?: No Diarrhea?: No Constipation?: No  Constitutional Fever: No Night sweats?: No Weight loss?: No Fatigue?: No  Skin Skin rash/lesions?: No Itching?: No  Eyes Blurred vision?: No Double vision?: No  Ears/Nose/Throat Sore throat?: No Sinus problems?: No  Hematologic/Lymphatic Swollen glands?: No Easy bruising?: No  Cardiovascular Leg  swelling?: No Chest pain?: No  Respiratory Cough?: No Shortness of breath?: No  Endocrine Excessive thirst?: No  Musculoskeletal Back pain?: Yes Joint pain?: No  Neurological Headaches?: No Dizziness?: No  Psychologic Depression?: Yes Anxiety?: No  Physical Exam: BP (!) 145/83   Pulse 67   Ht 5\' 7"  (1.702 m)   Wt 224 lb (101.6 kg)   BMI 35.08 kg/m   Constitutional:  Alert and oriented, No acute distress. HEENT: [place] AT, moist mucus membranes.  Trachea midline, no masses. Cardiovascular: No clubbing, cyanosis, or edema. Respiratory: Normal respiratory effort, no increased work of breathing. Skin: No rashes, bruises or suspicious lesions. Neurologic: Grossly intact, no focal deficits, moving all 4 extremities. Psychiatric: Normal mood and affect.  Laboratory Data: Lab Results  Component Value Date   WBC 7.5 01/23/2015   HGB 13.5 01/23/2015   HCT 40.2 01/23/2015   MCV 76.8 (L) 01/23/2015   PLT 164 01/23/2015    Lab Results  Component Value Date   CREATININE 0.98 01/23/2015     Lab Results  Component Value Date   HGBA1C 6.8  (H) 05/22/2012    Urinalysis Results for orders placed or performed in visit on 11/15/17  Urinalysis, Complete  Result Value Ref Range   Specific Gravity, UA 1.020 1.005 - 1.030   pH, UA 5.5 5.0 - 7.5   Color, UA Yellow Yellow   Appearance Ur Clear Clear   Leukocytes, UA Negative Negative   Protein, UA Negative Negative/Trace   Glucose, UA Negative Negative   Ketones, UA Negative Negative   RBC, UA Trace (A) Negative   Bilirubin, UA Negative Negative   Urobilinogen, Ur 0.2 0.2 - 1.0 mg/dL   Nitrite, UA Negative Negative    Assessment & Plan:    1. History of bladder cancer Due for cysto next year Currently NED Keep next follow up  2. Urinary frequency As per previous discussions, I strongly cautioned against anticholinergics Given that he has refractory symptoms 25 mg, he was advised to double up the rest of his samples and he was given additional 4 boxes of 50 mg to titrate upwards After he completes this course in about 6 weeks, he is advised to call or message our office and let us know how its going He was also given information today on Botox and PTNS He is most interested in Botox as treatment for his refractory symptoms if Mybetriq fails He will let us know if he like to pursue this in the future, we did go ahead and discussed the risk and benefits including risk of bleeding, infection, failure of the medication to work, urinary retention rate of about 5%   F/u as scheduled, call after 6 weeks to let us know how its going  [name], [place] [address], [place], [place] [address] [phone]  I spent 15 min with this patient of which greater than 50% was spent in counseling and coordination of care with the patient.

## 2017-11-25 NOTE — Progress Notes (Signed)
11/25/2017 12:49 PM   [name] [birth year] [id number]  Referring provider: Ricardo Jericho, NP [address] [place], [place] [address]  Chief Complaint  Patient presents with  . Urinary Incontinence    HPI: 72 year old male with a history of bladder cancer status post recent cystoscopy on 11/15/2017 who returns primarily to discuss his voiding symptoms.  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  He underwent TURBT on 12/2013 with pathology consistent with low-grade TA cc.  He is been on surveillance since without recurrence.  He is now on annual cystoscopy.  He was previously treated with oxybutynin by another provider.  He called our office several months ago requesting refill for oxybutynin for refractory urgency and urge incontinence.  At last visit, he was discovered to have new onset dementia and has been recently started on Namenda.  As such, anticholinergics are contraindicated.  10 days ago, he was given samples of Myrbetriq 25 mg which he has been taking.  He does not think that the have helped.  He does report that he is taking this medication in the past and it was previously helpful.  He had difficulty affording the medication.  Notably today, he somewhat confused about what he is taken in the affect.  He does not recall our conversation at the time of cystoscopy 10 days ago.   PMH: Past Medical History:  Diagnosis Date  . Anxiety   . Arteriosclerosis of coronary artery 11/16/2011   Overview:  Stent 10/2011 stent rca 2015 with collaterals to lad which is chronically occluded   . Benign enlargement of prostate   . Benign essential HTN 06/11/2014  . Benign prostatic hypertrophy without urinary obstruction 07/31/2014  . Bilateral cataracts 05/16/2013   Overview:  Dr. Tobe Sos [facility]    . BP (high blood pressure) 11/09/2012  . Cancer ([place])    skin  . Carotid artery  narrowing 02/08/2014  . Depression   . Detrusor hypertrophy   . Diabetes ([place])   . Diabetes mellitus, type 2 ([place]) 12/12/2012  . Esophageal reflux   . Fothergill's neuralgia 08/07/2012   Overview:  [facility]   . GERD (gastroesophageal reflux disease)   . Headache   . 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 Hearing loss in left ear   . Heart disease   . Hematuria   . Hemorrhoids   . Hypercholesteremia   . Lesion of bladder   . Myocardial infarct ([place])   . Presence of stent in coronary artery 11/09/2012  . Rectal bleeding   . Trigeminal neuralgia   . Valvular heart disease   . Vitamin D deficiency     Surgical History: Past Surgical History:  Procedure Laterality Date  . APPENDECTOMY    . CARDIAC CATHETERIZATION    . CARDIAC CATHETERIZATION N/A 01/22/2015   Procedure: Left Heart Cath;  Surgeon: Corey Skains, MD;  Location: [place] [facility];  Service: Cardiovascular;  Laterality: N/A;  . CARDIAC CATHETERIZATION N/A 01/22/2015   Procedure: Coronary Stent Intervention;  Surgeon: Isaias Cowman, MD;  Location: [place] [facility];  Service: Cardiovascular;  Laterality: N/A;  . COLONOSCOPY WITH PROPOFOL N/A 12/08/2015   Procedure: COLONOSCOPY WITH PROPOFOL;  Surgeon: Lollie Sails, MD;  Location: [facility] ENDOSCOPY;  Service: Endoscopy;  Laterality: N/A;  . COLONOSCOPY WITH PROPOFOL N/A 12/09/2015   Procedure: COLONOSCOPY WITH PROPOFOL;  Surgeon: Lollie Sails, MD;  Location:  [facility] ENDOSCOPY;  Service: Endoscopy;  Laterality: N/A;  . CORONARY STENT PLACEMENT  2015   x5  . ESOPHAGOGASTRODUODENOSCOPY (EGD) WITH PROPOFOL N/A 12/08/2015   Procedure: ESOPHAGOGASTRODUODENOSCOPY (EGD) WITH PROPOFOL;  Surgeon: Lollie Sails, MD;  Location: [facility] ENDOSCOPY;  Service: Endoscopy;  Laterality: N/A;  . HERNIA REPAIR    . kidney tumor remove    . urethral meatotomy      Home Medications:  Allergies as of 11/25/2017      Reactions   Ace Inhibitors Cough   Lyrica  [pregabalin] Rash      Medication List        Accurate as of 11/25/17 11:59 PM. Always use your most recent med list.          aspirin 81 MG tablet Take 81 mg by mouth daily.   citalopram 40 MG tablet Commonly known as:  CELEXA Take 40 mg by mouth daily.   clopidogrel 75 MG tablet Commonly known as:  PLAVIX Take by mouth.   gabapentin 100 MG capsule Commonly known as:  NEURONTIN Take 100 mg by mouth 3 (three) times daily.   losartan 50 MG tablet Commonly known as:  COZAAR TAKE 1 TABLET (50 MG TOTAL) BY MOUTH ONCE DAILY.   memantine 5 MG tablet Commonly known as:  NAMENDA Take by mouth.   mirabegron ER 25 MG Tb24 tablet Commonly known as:  MYRBETRIQ Take 1 tablet (25 mg total) by mouth daily.   nitroGLYCERIN 0.4 MG SL tablet Commonly known as:  NITROSTAT Place under the tongue.   Omega-3 1000 MG Caps Take 1 g by mouth. Reported on 03/21/2015   pantoprazole 40 MG tablet Commonly known as:  PROTONIX Take 40 mg by mouth daily.   pravastatin 80 MG tablet Commonly known as:  PRAVACHOL TAKE 1 TABLET (80 MG TOTAL) BY MOUTH NIGHTLY.   RA BLOOD GLUCOSE MONITOR Devi by Does not apply route.   sildenafil 50 MG tablet Commonly known as:  VIAGRA Take 50 mg by mouth daily as needed for erectile dysfunction.   TRILEPTAL 150 MG tablet Generic drug:  OXcarbazepine Reported on 03/21/2015   VITAMIN D-1000 MAX ST 1000 units tablet Generic drug:  Cholecalciferol Take by mouth.       Allergies:  Allergies  Allergen Reactions  . Ace Inhibitors Cough  . Lyrica [Pregabalin] Rash    Family History: Family History  Problem Relation Age of Onset  . Kidney cancer Mother   . Prostate cancer Neg Hx     Social History:  reports that he quit smoking about 28 years ago. His smoking use included cigarettes. He has never used smokeless tobacco. He reports that he drinks alcohol. He reports that he does not use drugs.  ROS: UROLOGY Frequent Urination?: Yes Hard to  postpone urination?: No Burning/pain with urination?: No Get up at night to urinate?: No Leakage of urine?: No Urine stream starts and stops?: No Trouble starting stream?: No Do you have to strain to urinate?: No Blood in urine?: No Urinary tract infection?: No Sexually transmitted disease?: No Injury to kidneys or bladder?: No Painful intercourse?: No Weak stream?: No Erection problems?: No Penile pain?: No  Gastrointestinal Nausea?: No Vomiting?: No Indigestion/heartburn?: No Diarrhea?: No Constipation?: No  Constitutional Fever: No Night sweats?: No Weight loss?: No Fatigue?: No  Skin Skin rash/lesions?: No Itching?: No  Eyes Blurred vision?: No Double vision?: No  Ears/Nose/Throat Sore throat?: No Sinus problems?: No  Hematologic/Lymphatic Swollen glands?: No Easy bruising?: No  Cardiovascular Leg  swelling?: No Chest pain?: No  Respiratory Cough?: No Shortness of breath?: No  Endocrine Excessive thirst?: No  Musculoskeletal Back pain?: Yes Joint pain?: No  Neurological Headaches?: No Dizziness?: No  Psychologic Depression?: Yes Anxiety?: No  Physical Exam: BP (!) 145/83   Pulse 67   Ht 5\' 7"  (1.702 m)   Wt 224 lb (101.6 kg)   BMI 35.08 kg/m   Constitutional:  Alert and oriented, No acute distress. HEENT: [place] AT, moist mucus membranes.  Trachea midline, no masses. Cardiovascular: No clubbing, cyanosis, or edema. Respiratory: Normal respiratory effort, no increased work of breathing. Skin: No rashes, bruises or suspicious lesions. Neurologic: Grossly intact, no focal deficits, moving all 4 extremities. Psychiatric: Normal mood and affect.  Laboratory Data: Lab Results  Component Value Date   WBC 7.5 01/23/2015   HGB 13.5 01/23/2015   HCT 40.2 01/23/2015   MCV 76.8 (L) 01/23/2015   PLT 164 01/23/2015    Lab Results  Component Value Date   CREATININE 0.98 01/23/2015     Lab Results  Component Value Date   HGBA1C 6.8  (H) 05/22/2012    Urinalysis Results for orders placed or performed in visit on 11/15/17  Urinalysis, Complete  Result Value Ref Range   Specific Gravity, UA 1.020 1.005 - 1.030   pH, UA 5.5 5.0 - 7.5   Color, UA Yellow Yellow   Appearance Ur Clear Clear   Leukocytes, UA Negative Negative   Protein, UA Negative Negative/Trace   Glucose, UA Negative Negative   Ketones, UA Negative Negative   RBC, UA Trace (A) Negative   Bilirubin, UA Negative Negative   Urobilinogen, Ur 0.2 0.2 - 1.0 mg/dL   Nitrite, UA Negative Negative    Assessment & Plan:    1. History of bladder cancer Due for cysto next year Currently NED Keep next follow up  2. Urinary frequency As per previous discussions, I strongly cautioned against anticholinergics Given that he has refractory symptoms 25 mg, he was advised to double up the rest of his samples and he was given additional 4 boxes of 50 mg to titrate upwards After he completes this course in about 6 weeks, he is advised to call or message our office and let us know how its going He was also given information today on Botox and PTNS He is most interested in Botox as treatment for his refractory symptoms if Mybetriq fails He will let us know if he like to pursue this in the future, we did go ahead and discussed the risk and benefits including risk of bleeding, infection, failure of the medication to work, urinary retention rate of about 5%   F/u as scheduled, call after 6 weeks to let us know how its going  [name], [place] [address] [place], [place] [address] [phone]  I spent 15 min with this patient of which greater than 50% was spent in counseling and coordination of care with the patient.

## 2017-12-01 ENCOUNTER — Telehealth: Payer: Self-pay | Admitting: Family Medicine

## 2017-12-01 NOTE — Telephone Encounter (Signed)
Botox info sent to patient

## 2017-12-01 NOTE — Telephone Encounter (Signed)
-----   Message from [name], MD sent at 12/01/2017  1:04 PM EDT ----- This guy was in [place] last week and we were out of botox handouts.  Can someone please mail him one?

## 2017-12-05 ENCOUNTER — Telehealth: Payer: Self-pay | Admitting: Urology

## 2017-12-05 NOTE — Telephone Encounter (Signed)
Pt called office today and would like to proceed with Botox.

## 2017-12-05 NOTE — Telephone Encounter (Signed)
He needs to be schedule for nurse visit to learn CIC as a precaution in case he goes into retention.  If he is successful, we will book the case in the OR.  [name], MD

## 2017-12-06 NOTE — Telephone Encounter (Signed)
CIC teaching appointment made. Patient aware.

## 2017-12-09 ENCOUNTER — Ambulatory Visit (INDEPENDENT_AMBULATORY_CARE_PROVIDER_SITE_OTHER): Payer: Medicare HMO | Admitting: Family Medicine

## 2017-12-09 ENCOUNTER — Other Ambulatory Visit: Payer: Self-pay | Admitting: Gastroenterology

## 2017-12-09 DIAGNOSIS — Z8551 Personal history of malignant neoplasm of bladder: Secondary | ICD-10-CM

## 2017-12-09 DIAGNOSIS — R1312 Dysphagia, oropharyngeal phase: Secondary | ICD-10-CM

## 2017-12-09 NOTE — Progress Notes (Signed)
Continuous Intermittent Catheterization  Due to Botox treatment, patient is present today for a teaching of self I & O Catheterization. Patient was given detailed verbal and printed instructions of self catheterization. Patient was cleaned and prepped in a sterile fashion.  With instruction and assistance patient inserted a 14FR and urine return was noted 30 ml, urine was yellow in color. Patient tolerated well, no complications were noted Patient was given a sample bag with supplies to take home.  Instructions were given per Dr Erlene Quan for patient to cath if needed after Botox treatment.  Preformed by: Elberta Leatherwood, CMA

## 2017-12-09 NOTE — Telephone Encounter (Signed)
Yes please schedule.  [name], MD

## 2017-12-09 NOTE — Telephone Encounter (Signed)
Patient learned to successfully do CIC. Ok to schedule Botox?

## 2017-12-12 ENCOUNTER — Other Ambulatory Visit: Payer: Self-pay | Admitting: Radiology

## 2017-12-12 ENCOUNTER — Encounter: Payer: Self-pay | Admitting: *Deleted

## 2017-12-12 DIAGNOSIS — R350 Frequency of micturition: Secondary | ICD-10-CM

## 2017-12-13 ENCOUNTER — Ambulatory Visit
Admission: RE | Admit: 2017-12-13 | Discharge: 2017-12-13 | Disposition: A | Discharge status: Home / Self Care | Payer: Medicare HMO | Source: Ambulatory Visit | Attending: Gastroenterology | Admitting: Gastroenterology

## 2017-12-13 ENCOUNTER — Encounter: Payer: Self-pay | Admitting: *Deleted

## 2017-12-13 ENCOUNTER — Ambulatory Visit: Payer: Medicare HMO | Admitting: Anesthesiology

## 2017-12-13 ENCOUNTER — Encounter
Admission: RE | Disposition: A | Discharge status: Home / Self Care | Payer: Self-pay | Source: Ambulatory Visit | Attending: Gastroenterology

## 2017-12-13 DIAGNOSIS — Z85828 Personal history of other malignant neoplasm of skin: Secondary | ICD-10-CM | POA: Diagnosis not present

## 2017-12-13 DIAGNOSIS — K449 Diaphragmatic hernia without obstruction or gangrene: Secondary | ICD-10-CM | POA: Insufficient documentation

## 2017-12-13 DIAGNOSIS — K219 Gastro-esophageal reflux disease without esophagitis: Secondary | ICD-10-CM | POA: Diagnosis not present

## 2017-12-13 DIAGNOSIS — Z7982 Long term (current) use of aspirin: Secondary | ICD-10-CM | POA: Insufficient documentation

## 2017-12-13 DIAGNOSIS — F419 Anxiety disorder, unspecified: Secondary | ICD-10-CM | POA: Insufficient documentation

## 2017-12-13 DIAGNOSIS — F329 Major depressive disorder, single episode, unspecified: Secondary | ICD-10-CM | POA: Diagnosis not present

## 2017-12-13 DIAGNOSIS — Z955 Presence of coronary angioplasty implant and graft: Secondary | ICD-10-CM | POA: Diagnosis not present

## 2017-12-13 DIAGNOSIS — E119 Type 2 diabetes mellitus without complications: Secondary | ICD-10-CM | POA: Insufficient documentation

## 2017-12-13 DIAGNOSIS — I10 Essential (primary) hypertension: Secondary | ICD-10-CM | POA: Insufficient documentation

## 2017-12-13 DIAGNOSIS — G500 Trigeminal neuralgia: Secondary | ICD-10-CM | POA: Diagnosis not present

## 2017-12-13 DIAGNOSIS — E7800 Pure hypercholesterolemia, unspecified: Secondary | ICD-10-CM | POA: Diagnosis not present

## 2017-12-13 DIAGNOSIS — Z79899 Other long term (current) drug therapy: Secondary | ICD-10-CM | POA: Insufficient documentation

## 2017-12-13 DIAGNOSIS — K228 Other specified diseases of esophagus: Secondary | ICD-10-CM | POA: Diagnosis not present

## 2017-12-13 DIAGNOSIS — Z7902 Long term (current) use of antithrombotics/antiplatelets: Secondary | ICD-10-CM | POA: Diagnosis not present

## 2017-12-13 DIAGNOSIS — E559 Vitamin D deficiency, unspecified: Secondary | ICD-10-CM | POA: Diagnosis not present

## 2017-12-13 DIAGNOSIS — I2510 Atherosclerotic heart disease of native coronary artery without angina pectoris: Secondary | ICD-10-CM | POA: Diagnosis not present

## 2017-12-13 DIAGNOSIS — R1310 Dysphagia, unspecified: Secondary | ICD-10-CM | POA: Diagnosis present

## 2017-12-13 DIAGNOSIS — I252 Old myocardial infarction: Secondary | ICD-10-CM | POA: Insufficient documentation

## 2017-12-13 DIAGNOSIS — Z87891 Personal history of nicotine dependence: Secondary | ICD-10-CM | POA: Insufficient documentation

## 2017-12-13 HISTORY — DX: Diverticulosis of intestine, part unspecified, without perforation or abscess without bleeding: K57.90

## 2017-12-13 HISTORY — PX: ESOPHAGOGASTRODUODENOSCOPY (EGD) WITH PROPOFOL: SHX5813

## 2017-12-13 HISTORY — DX: Gastritis, unspecified, without bleeding: K29.70

## 2017-12-13 SURGERY — ESOPHAGOGASTRODUODENOSCOPY (EGD) WITH PROPOFOL
Anesthesia: General

## 2017-12-13 MED ORDER — FENTANYL CITRATE (PF) 100 MCG/2ML IJ SOLN
INTRAMUSCULAR | Status: AC
  Filled 2017-12-13: qty 2

## 2017-12-13 MED ORDER — LIDOCAINE 2% (20 MG/ML) 5 ML SYRINGE
INTRAMUSCULAR | Status: DC | PRN
  Administered 2017-12-13: 30 mg via INTRAVENOUS

## 2017-12-13 MED ORDER — SODIUM CHLORIDE 0.9 % IV SOLN
INTRAVENOUS | Status: DC
  Administered 2017-12-13: 1000 mL via INTRAVENOUS

## 2017-12-13 MED ORDER — PROPOFOL 10 MG/ML IV BOLUS
INTRAVENOUS | Status: AC
  Filled 2017-12-13: qty 20

## 2017-12-13 MED ORDER — FENTANYL CITRATE (PF) 100 MCG/2ML IJ SOLN
INTRAMUSCULAR | Status: DC | PRN
  Administered 2017-12-13 (×2): 50 ug via INTRAVENOUS

## 2017-12-13 MED ORDER — PROPOFOL 10 MG/ML IV BOLUS
INTRAVENOUS | Status: DC | PRN
  Administered 2017-12-13: 100 mg via INTRAVENOUS

## 2017-12-13 MED ORDER — PROPOFOL 500 MG/50ML IV EMUL
INTRAVENOUS | Status: DC | PRN
  Administered 2017-12-13: 180 ug/kg/min via INTRAVENOUS

## 2017-12-13 NOTE — Anesthesia Preprocedure Evaluation (Signed)
Anesthesia Evaluation  Patient identified by MRN, date of birth, ID band Patient awake    Reviewed: Allergy & Precautions, H&P , NPO status , Patient's Chart, lab work & pertinent test results  History of Anesthesia Complications Negative for: history of anesthetic complications  Airway Mallampati: III  TM Distance: <3 FB Neck ROM: limited    Dental  (+) Upper Dentures, Lower Dentures   Pulmonary neg shortness of breath, former smoker,           Cardiovascular Exercise Tolerance: Good hypertension, + angina + CAD and + Past MI       Neuro/Psych  Headaches, PSYCHIATRIC DISORDERS  Neuromuscular disease    GI/Hepatic Neg liver ROS, GERD  Medicated and Controlled,  Endo/Other  diabetes, Type 2  Renal/GU negative Renal ROS  negative genitourinary   Musculoskeletal   Abdominal   Peds  Hematology negative hematology ROS (+)   Anesthesia Other Findings Past Medical History: No date: Anxiety 11/16/2011: Arteriosclerosis of coronary artery     Comment:  Overview:  Stent 10/2011 stent rca 2015 with collaterals               to lad which is chronically occluded  No date: Benign enlargement of prostate 06/11/2014: Benign essential HTN 07/31/2014: Benign prostatic hypertrophy without urinary obstruction 05/16/2013: Bilateral cataracts     Comment:  Overview:  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 No date: Diabetes ([place]) 12/12/2012: Diabetes mellitus, type 2 (HCC) No date: Diverticulosis No date: Esophageal reflux No date: Esophageal reflux 08/07/2012: Fothergill's neuralgia     Comment:  Overview:  [facility]  No date: Gastritis No date: GERD (gastroesophageal reflux disease) No date: Headache     Comment:  cluster headaches 11/09/2012: Healed myocardial infarct No  date: Hearing loss in left ear No date: Heart disease No date: Hematuria No date: Hemorrhoids No date: Hypercholesteremia No date: Lesion of bladder No date: Myocardial infarct ([place]) 11/09/2012: Presence of stent in coronary artery No date: Rectal bleeding No date: Trigeminal neuralgia No date: Trigeminal neuralgia No date: Valvular heart disease No date: Vitamin D deficiency  Past Surgical History: No date: APPENDECTOMY No date: CARDIAC CATHETERIZATION 01/22/2015: CARDIAC CATHETERIZATION; N/A     Comment:  Procedure: Left Heart Cath;  Surgeon: Corey Skains,               MD;  Location: [facility];  Service:               Cardiovascular;  Laterality: N/A; 01/22/2015: CARDIAC CATHETERIZATION; N/A     Comment:  Procedure: Coronary Stent Intervention;  Surgeon:               Isaias Cowman, MD;  Location: [facility];              Service: Cardiovascular;  Laterality: N/A; 12/08/2015: COLONOSCOPY WITH PROPOFOL; N/A     Comment:  Procedure: COLONOSCOPY WITH PROPOFOL;  Surgeon: Lollie Sails, MD;  Location: [facility] ENDOSCOPY;  Service:               Endoscopy;  Laterality: N/A; 12/09/2015: COLONOSCOPY WITH PROPOFOL; N/A     Comment:  Procedure: COLONOSCOPY WITH PROPOFOL;  Surgeon: Billie Ruddy  Gustavo Lah, MD;  Location: [facility] ENDOSCOPY;  Service:               Endoscopy;  Laterality: N/A; 2015: CORONARY STENT PLACEMENT     Comment:  x5 12/08/2015: ESOPHAGOGASTRODUODENOSCOPY (EGD) WITH PROPOFOL; N/A     Comment:  Procedure: ESOPHAGOGASTRODUODENOSCOPY (EGD) WITH               PROPOFOL;  Surgeon: Lollie Sails, MD;  Location:               [facility] ENDOSCOPY;  Service: Endoscopy;  Laterality: N/A; No date: HERNIA REPAIR 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 No date: urethral meatotomy  BMI    Body Mass Index:  35.08 kg/m      Reproductive/Obstetrics negative OB ROS                             Anesthesia  Physical Anesthesia Plan  ASA: IV  Anesthesia Plan: General   Post-op Pain Management:    Induction: Intravenous  PONV Risk Score and Plan: Propofol infusion and TIVA  Airway Management Planned: Natural Airway and Nasal Cannula  Additional Equipment:   Intra-op Plan:   Post-operative Plan:   Informed Consent: I have reviewed the patients History and Physical, chart, labs and discussed the procedure including the risks, benefits and alternatives for the proposed anesthesia with the patient or authorized representative who has indicated his/her understanding and acceptance.   Dental Advisory Given  Plan Discussed with: Anesthesiologist, CRNA and Surgeon  Anesthesia Plan Comments: (Patient has cardiac clearance for this procedure.    Patient consented for risks of anesthesia including but not limited to:  - adverse reactions to medications - risk of intubation if required - damage to teeth, lips or other oral mucosa - sore throat or hoarseness - Damage to heart, brain, lungs or loss of life  Patient voiced understanding.)        Anesthesia Quick Evaluation

## 2017-12-13 NOTE — Anesthesia Postprocedure Evaluation (Signed)
Anesthesia Post Note  Patient: [name]  Procedure(s) Performed: ESOPHAGOGASTRODUODENOSCOPY (EGD) WITH PROPOFOL (N/A )  Patient location during evaluation: Endoscopy Anesthesia Type: General Level of consciousness: awake and alert Pain management: pain level controlled Vital Signs Assessment: post-procedure vital signs reviewed and stable Respiratory status: spontaneous breathing, nonlabored ventilation, respiratory function stable and patient connected to nasal cannula oxygen Cardiovascular status: blood pressure returned to baseline and stable Postop Assessment: no apparent nausea or vomiting Anesthetic complications: no     Last Vitals:  Vitals:   12/13/17 1014 12/13/17 1053  BP: (!) 131/96 130/70  Pulse: 78   Resp: (!) 6 16  Temp: (!) 36.1 C   SpO2: 95% 98%    Last Pain:  Vitals:   12/13/17 1040  TempSrc:   PainSc: 0-No pain                 Precious Haws Muhannad Bignell

## 2017-12-13 NOTE — Op Note (Signed)
[facility] Gastroenterology Patient Name: [name] Procedure Date: 12/13/2017 8:56 AM MRN: [id number] Account #: [id number] Date of Birth: Jun 24, 1945 Admit Type: Outpatient Age: 72 Room: [facility] ENDO ROOM 2 Gender: Male Note Status: Finalized Procedure:            Upper GI endoscopy Indications:          Dysphagia, Gastro-esophageal reflux disease Providers:            Lollie Sails, MD Referring MD:         Dani Gobble. Dema Severin, MD (Referring MD) Medicines:            Monitored Anesthesia Care Complications:        No immediate complications. Procedure:            Pre-Anesthesia Assessment:                       - ASA Grade Assessment: IV - A patient with severe                        systemic disease that is a constant threat to life.                       After obtaining informed consent, the endoscope was                        passed under direct vision. Throughout the procedure,                        the patient's blood pressure, pulse, and oxygen                        saturations were monitored continuously. The Endoscope                        was introduced through the mouth, and advanced to the                        third part of duodenum. The upper GI endoscopy was                        performed with moderate difficulty due to the patient's                        oxygen desaturation. Successful completion of the                        procedure was aided by performing the maneuvers                        documented (below) in this report, performing chin lift                        and administering oxygen. The patient tolerated the                        procedure. Findings:      The Z-line was irregular. Biopsies were taken with a cold forceps for       histology.      Abnormal motility was noted in the middle third of the  esophagus and in       the lower third of the esophagus. The cricopharyngeus was normal. There       is spasticity of  the esophageal body. The distal esophagus/lower       esophageal sphincter is open. Tertiary peristaltic waves are noted.      The entire examined stomach was normal.      A small hiatal hernia was found. The Z-line was a variable distance from       incisors; the hiatal hernia was sliding.      The cardia and gastric fundus were normal on retroflexion otherwise.      The examined duodenum was normal. Impression:           - Z-line irregular. Biopsied.                       - Abnormal esophageal motility, consistent with                        presbyesophagus.                       - Normal stomach.                       - Small hiatal hernia.                       - Normal examined duodenum. Recommendation:       - Continue present medications.                       - await results of modified barium swallow                       - Await pathology results.                       - Return to GI clinic in 4 weeks. Procedure Code(s):    --- Professional ---                       [phone], Esophagogastroduodenoscopy, flexible, transoral;                        with biopsy, single or multiple Diagnosis Code(s):    --- Professional ---                       K22.8, Other specified diseases of esophagus                       K22.4, Dyskinesia of esophagus                       K44.9, Diaphragmatic hernia without obstruction or                        gangrene                       R13.10, Dysphagia, unspecified                       K21.9, Gastro-esophageal reflux disease without  esophagitis CPT copyright 2018 American Medical Association. All rights reserved. The codes documented in this report are preliminary and upon coder review may  be revised to meet current compliance requirements. Lollie Sails, MD 12/13/2017 10:08:15 AM This report has been signed electronically. Number of Addenda: 0 Note Initiated On: 12/13/2017 8:56 AM      [facility]

## 2017-12-13 NOTE — H&P (Signed)
Outpatient short stay form Pre-procedure 12/13/2017 9:02 AM Lollie Sails MD  Primary Physician: Eulogio Bear NP  Reason for visit: EGD  History of present illness: Patient is a 72 year old male presenting today for an EGD in regards to symptoms of dysphagia and GERD.  He had a barium swallow on 10/27/2017 showing a presbyesophagus with normal passage of the 13 mm tablet.  The barium swallow showed a marked presbyesophagus.  There was no evidence of stenosis stricture or other atypical lesion.  Patient does take Plavix and has held that for about 6 to 7 days.  He takes a 81 mg aspirin that is been held for the last 2 days.  He takes no other aspirin products or blood thinner.  Explained in detail the findings on his barium swallow.   No current facility-administered medications for this encounter.   Medications Prior to Admission  Medication Sig Dispense Refill Last Dose  . isosorbide mononitrate (IMDUR) 30 MG 24 hr tablet Take 30 mg by mouth daily.     [name] losartan (COZAAR) 25 MG tablet Take 12.5 mg by mouth daily.     . Multiple Vitamins-Iron (MULTIVITAMINS WITH IRON) TABS tablet Take 1 tablet by mouth daily.     [name] oxybutynin (DITROPAN XL) 15 MG 24 hr tablet Take 15 mg by mouth at bedtime.     . pramipexole (MIRAPEX) 1 MG tablet Take 1 mg by mouth daily.     [name] aspirin 81 MG tablet Take 81 mg by mouth daily.   Taking  . Blood Glucose Monitoring Suppl (RA BLOOD GLUCOSE MONITOR) DEVI by Does not apply route.   Taking  . Cholecalciferol (VITAMIN D-1000 MAX ST) 1000 UNITS tablet Take by mouth.   Taking  . citalopram (CELEXA) 40 MG tablet Take 40 mg by mouth daily.   Taking  . clopidogrel (PLAVIX) 75 MG tablet Take by mouth.   Taking  . gabapentin (NEURONTIN) 100 MG capsule Take 100 mg by mouth 3 (three) times daily.   Taking  . losartan (COZAAR) 50 MG tablet TAKE 1 TABLET (50 MG TOTAL) BY MOUTH ONCE DAILY.  6 Not Taking at Unknown time  . memantine (NAMENDA) 5 MG tablet Take by mouth.    Taking  . mirabegron ER (MYRBETRIQ) 25 MG TB24 tablet Take 1 tablet (25 mg total) by mouth daily. 30 tablet 11   . nitroGLYCERIN (NITROSTAT) 0.4 MG SL tablet Place under the tongue.   Taking  . Omega-3 1000 MG CAPS Take 1 g by mouth. Reported on 03/21/2015   Taking  . OXcarbazepine (TRILEPTAL) 150 MG tablet Reported on 03/21/2015   Taking  . pantoprazole (PROTONIX) 40 MG tablet Take 40 mg by mouth daily.   Taking  . pravastatin (PRAVACHOL) 80 MG tablet TAKE 1 TABLET (80 MG TOTAL) BY MOUTH NIGHTLY.  3 Taking  . sildenafil (VIAGRA) 50 MG tablet Take 50 mg by mouth daily as needed for erectile dysfunction.   Taking     Allergies  Allergen Reactions  . Ace Inhibitors Cough  . Lyrica [Pregabalin] Rash     Past Medical History:  Diagnosis Date  . Anxiety   . Arteriosclerosis of coronary artery 11/16/2011   Overview:  Stent 10/2011 stent rca 2015 with collaterals to lad which is chronically occluded   . Benign enlargement of prostate   . Benign essential HTN 06/11/2014  . Benign prostatic hypertrophy without urinary obstruction 07/31/2014  . Bilateral cataracts 05/16/2013   Overview:  [facility]    .  BP (high blood pressure) 11/09/2012  . Cancer ([place])    skin  . Carotid artery narrowing 02/08/2014  . Depression   . Detrusor hypertrophy   . Diabetes ([place])   . Diabetes mellitus, type 2 ([place]) 12/12/2012  . Diverticulosis   . Esophageal reflux   . Esophageal reflux   . Fothergill's neuralgia 08/07/2012   Overview:  [facility]   . Gastritis   . GERD (gastroesophageal reflux disease)   . Headache    cluster headaches  . 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 Hearing loss in left ear   . Heart disease   . Hematuria   . Hemorrhoids   . Hypercholesteremia   . Lesion of bladder   . Myocardial infarct ([place])   . Presence of stent in coronary artery 11/09/2012  . Rectal bleeding   . Trigeminal neuralgia   . Trigeminal neuralgia   . Valvular heart disease   . Vitamin D  deficiency     Review of systems:      Physical Exam    Heart and lungs: Regular rate and rhythm without rub or gallop, lungs are bilaterally clear.    HEENT: Normocephalic atraumatic eyes are anicteric    Other:    Pertinant exam for procedure: Soft protuberant nontender bowel sounds are positive normoactive    Planned proceedures: EGD and indicated procedures. I have discussed the risks benefits and complications of procedures to include not limited to bleeding, infection, perforation and the risk of sedation and the patient wishes to proceed.    Lollie Sails, MD Gastroenterology 12/13/2017  9:02 AM

## 2017-12-13 NOTE — Transfer of Care (Signed)
Immediate Anesthesia Transfer of Care Note  Patient: [name]  Procedure(s) Performed: ESOPHAGOGASTRODUODENOSCOPY (EGD) WITH PROPOFOL (N/A )  Patient Location: PACU and Endoscopy Unit  Anesthesia Type:General  Level of Consciousness: awake and patient cooperative  Airway & Oxygen Therapy: Patient Spontanous Breathing and Patient connected to face mask oxygen  Post-op Assessment: Report given to RN and Post -op Vital signs reviewed and stable  Post vital signs: stable  Last Vitals:  Vitals Value Taken Time  BP 131/96 12/13/2017 10:11 AM  Temp 36.1 C 12/13/2017 10:10 AM  Pulse 79 12/13/2017 10:12 AM  Resp 10 12/13/2017 10:12 AM  SpO2 95 % 12/13/2017 10:12 AM  Vitals shown include unvalidated device data.  Last Pain:  Vitals:   12/13/17 1010  TempSrc: Tympanic  PainSc: 0-No pain         Complications: No apparent anesthesia complications

## 2017-12-13 NOTE — Anesthesia Post-op Follow-up Note (Signed)
Anesthesia QCDR form completed.        

## 2017-12-14 DIAGNOSIS — Z8719 Personal history of other diseases of the digestive system: Secondary | ICD-10-CM

## 2017-12-14 HISTORY — DX: Personal history of other diseases of the digestive system: Z87.19

## 2017-12-14 LAB — SURGICAL PATHOLOGY

## 2017-12-15 ENCOUNTER — Telehealth: Payer: Self-pay | Admitting: Family Medicine

## 2017-12-15 ENCOUNTER — Other Ambulatory Visit: Payer: Self-pay | Admitting: Radiology

## 2017-12-15 NOTE — Telephone Encounter (Signed)
Discussed with Dr Erlene Quan. Ok to proceed per Dr Erlene Quan. Reassured patient. Questions answered. Patient voices understanding & wishes to proceed with intravesical Botox injections scheduled 12/21/2017.

## 2017-12-15 NOTE — Telephone Encounter (Signed)
Patient had an Endoscopy this past Tuesday. His wife states they had a hard time getting him to wake up after anesthesia and his BP elevated. She states they had to keep him breathing. She is concerned because he is scheduled for Botox next week. Please review last surgery notes and consult with Dr. Donnella Sham.

## 2017-12-16 ENCOUNTER — Encounter
Admission: RE | Admit: 2017-12-16 | Discharge: 2017-12-16 | Disposition: A | Discharge status: Home / Self Care | Payer: Medicare HMO | Source: Ambulatory Visit | Attending: Urology | Admitting: Urology

## 2017-12-16 ENCOUNTER — Other Ambulatory Visit: Payer: Self-pay

## 2017-12-16 DIAGNOSIS — Z01812 Encounter for preprocedural laboratory examination: Secondary | ICD-10-CM | POA: Insufficient documentation

## 2017-12-16 HISTORY — DX: Personal history of other diseases of the digestive system: Z87.19

## 2017-12-16 LAB — URINALYSIS, ROUTINE W REFLEX MICROSCOPIC
BILIRUBIN URINE: NEGATIVE
Glucose, UA: NEGATIVE mg/dL
KETONES UR: NEGATIVE mg/dL
LEUKOCYTES UA: NEGATIVE
NITRITE: NEGATIVE
Protein, ur: NEGATIVE mg/dL
SQUAMOUS EPITHELIAL / LPF: NONE SEEN (ref 0–5)
Specific Gravity, Urine: 1.014 (ref 1.005–1.030)
pH: 6 (ref 5.0–8.0)

## 2017-12-16 NOTE — Patient Instructions (Signed)
Your procedure is scheduled on: December 21, 2017 Report to Day Surgery on the 2nd floor of the [facility]. To find out your arrival time, please call [phone] between 1PM - 3PM on: December 20, 2017  REMEMBER: Instructions that are not followed completely may result in serious medical risk, up to and including death; or upon the discretion of your surgeon and anesthesiologist your surgery may need to be rescheduled.  Do not eat food after midnight the night before surgery.  No gum chewing, lozengers or hard candies.  You may however, drink CLEAR liquids up to 2 hours before you are scheduled to arrive for your surgery. Do not drink anything within 2 hours of the start of your surgery.  Clear liquids include: - water  - apple juice without pulp - gatorade - black coffee or tea (Do NOT add milk or creamers to the coffee or tea) Do NOT drink anything that is not on this list.  No Alcohol for 24 hours before or after surgery.  No Smoking including e-cigarettes for 24 hours prior to surgery.  No chewable tobacco products for at least 6 hours prior to surgery.  No nicotine patches on the day of surgery.  On the morning of surgery brush your teeth with toothpaste and water, you may rinse your mouth with mouthwash if you wish. Do not swallow any toothpaste or mouthwash.  Notify your doctor if there is any change in your medical condition (cold, fever, infection).  Do not wear jewelry, make-up, hairpins, clips or nail polish.  Do not wear lotions, powders, or perfumes.   Do not shave 48 hours prior to surgery. Men may shave face and neck.  Contacts and dentures may not be worn into surgery.  Do not bring valuables to the hospital, including drivers license, insurance or credit cards.  [facility] is not responsible for any belongings or valuables.   TAKE THESE MEDICATIONS THE MORNING OF SURGERY:  1.  Citalopram 2.  Gabapentin 3.  Isosorbide  4.  Pantoprazole (take one the  night before surgery and one the morning of surgery - helps prevent nausea after surgery) 5.  Pramipexole  Follow recommendations from Cardiologist regarding stopping Aspirin, Plavix.  NOW!  Stop Anti-inflammatories (NSAIDS) such as Advil, Aleve, Ibuprofen, Motrin, Naproxen, Naprosyn and Aspirin based products such as Excedrin, Goodys Powder, BC Powder. (May take Tylenol or Acetaminophen if needed.)  NOW!  Stop ANY OVER THE COUNTER supplements until after surgery. ([place]) (May continue Vitamin D and multivitamin.)  Wear comfortable clothing (specific to your surgery type) to the hospital.  If you are being discharged the day of surgery, you will not be allowed to drive home. You will need a responsible adult to drive you home and stay with you that night.   If you are taking public transportation, you will need to have a responsible adult with you. Please confirm with your physician that it is acceptable to use public transportation.   Please call [phone] if you have any questions about these instructions.

## 2017-12-18 LAB — URINE CULTURE: Special Requests: NORMAL

## 2017-12-19 ENCOUNTER — Telehealth: Payer: Self-pay | Admitting: Radiology

## 2017-12-19 DIAGNOSIS — R350 Frequency of micturition: Secondary | ICD-10-CM

## 2017-12-19 MED ORDER — NITROFURANTOIN MONOHYD MACRO 100 MG PO CAPS: 100.0000 mg | ORAL_CAPSULE | Freq: Two times a day (BID) | ORAL | 0 refills | Status: DC

## 2017-12-19 NOTE — Telephone Encounter (Signed)
I am extremely concerned about the patient's memory issues.  I will forward this note to his primary care.    He was given 6 weeks of samples less than 4 weeks ago.  See my documented note.  This is been discussed now on multiple occasions and in fact, he is been seen in the clinic additional times because he forgot about my recommendations/ conversation.    He is scheduled for botox this week so hopefully won't need any additional treatment.    [name], MD

## 2017-12-19 NOTE — Telephone Encounter (Signed)
Notified patient of positive urine culture & script sent to pharmacy. Instructions given for how to take medication including day of surgery. Questions answered. Patient voices understanding.

## 2017-12-19 NOTE — Telephone Encounter (Signed)
-----   Message from [name], MD sent at 12/19/2017  7:36 AM EDT ----- Please have him start macrobid 100 mg bid x 7 days starting THIS AM  [name], MD

## 2017-12-19 NOTE — Telephone Encounter (Signed)
Patient requests Myrbetriq 50mg  samples or a script for something cheaper. States medication is too expensive. Please advise.

## 2017-12-19 NOTE — Telephone Encounter (Signed)
duplicate

## 2017-12-19 NOTE — Progress Notes (Signed)
Abnormal urine culture results faxed to Dr. Erlene Quan.

## 2017-12-19 NOTE — Telephone Encounter (Signed)
LMOM for patient to return call.

## 2017-12-20 MED ORDER — CEFAZOLIN SODIUM-DEXTROSE 2-4 GM/100ML-% IV SOLN
2.0000 g | INTRAVENOUS | Status: AC
  Administered 2017-12-21: 2 g via INTRAVENOUS

## 2017-12-20 NOTE — Telephone Encounter (Signed)
Patient notified

## 2017-12-21 ENCOUNTER — Encounter: Payer: Self-pay | Admitting: Emergency Medicine

## 2017-12-21 ENCOUNTER — Other Ambulatory Visit: Payer: Self-pay

## 2017-12-21 ENCOUNTER — Ambulatory Visit
Admission: RE | Admit: 2017-12-21 | Discharge: 2017-12-21 | Disposition: A | Discharge status: Home / Self Care | Payer: Medicare HMO | Source: Ambulatory Visit | Attending: Urology | Admitting: Urology

## 2017-12-21 ENCOUNTER — Ambulatory Visit: Payer: Medicare HMO | Admitting: Anesthesiology

## 2017-12-21 ENCOUNTER — Encounter
Admission: RE | Disposition: A | Discharge status: Home / Self Care | Payer: Self-pay | Source: Ambulatory Visit | Attending: Urology

## 2017-12-21 DIAGNOSIS — Z8551 Personal history of malignant neoplasm of bladder: Secondary | ICD-10-CM | POA: Insufficient documentation

## 2017-12-21 DIAGNOSIS — F419 Anxiety disorder, unspecified: Secondary | ICD-10-CM | POA: Diagnosis not present

## 2017-12-21 DIAGNOSIS — E119 Type 2 diabetes mellitus without complications: Secondary | ICD-10-CM | POA: Insufficient documentation

## 2017-12-21 DIAGNOSIS — K219 Gastro-esophageal reflux disease without esophagitis: Secondary | ICD-10-CM | POA: Insufficient documentation

## 2017-12-21 DIAGNOSIS — Z7982 Long term (current) use of aspirin: Secondary | ICD-10-CM | POA: Insufficient documentation

## 2017-12-21 DIAGNOSIS — I10 Essential (primary) hypertension: Secondary | ICD-10-CM | POA: Insufficient documentation

## 2017-12-21 DIAGNOSIS — Z87891 Personal history of nicotine dependence: Secondary | ICD-10-CM | POA: Diagnosis not present

## 2017-12-21 DIAGNOSIS — Z85828 Personal history of other malignant neoplasm of skin: Secondary | ICD-10-CM | POA: Diagnosis not present

## 2017-12-21 DIAGNOSIS — F0390 Unspecified dementia without behavioral disturbance: Secondary | ICD-10-CM | POA: Diagnosis not present

## 2017-12-21 DIAGNOSIS — N3289 Other specified disorders of bladder: Secondary | ICD-10-CM | POA: Diagnosis not present

## 2017-12-21 DIAGNOSIS — F329 Major depressive disorder, single episode, unspecified: Secondary | ICD-10-CM | POA: Diagnosis not present

## 2017-12-21 DIAGNOSIS — I252 Old myocardial infarction: Secondary | ICD-10-CM | POA: Diagnosis not present

## 2017-12-21 DIAGNOSIS — N3000 Acute cystitis without hematuria: Secondary | ICD-10-CM | POA: Diagnosis not present

## 2017-12-21 DIAGNOSIS — I2510 Atherosclerotic heart disease of native coronary artery without angina pectoris: Secondary | ICD-10-CM | POA: Insufficient documentation

## 2017-12-21 DIAGNOSIS — R32 Unspecified urinary incontinence: Secondary | ICD-10-CM | POA: Diagnosis present

## 2017-12-21 DIAGNOSIS — Z79899 Other long term (current) drug therapy: Secondary | ICD-10-CM | POA: Diagnosis not present

## 2017-12-21 DIAGNOSIS — E7800 Pure hypercholesterolemia, unspecified: Secondary | ICD-10-CM | POA: Diagnosis not present

## 2017-12-21 DIAGNOSIS — Z7902 Long term (current) use of antithrombotics/antiplatelets: Secondary | ICD-10-CM | POA: Diagnosis not present

## 2017-12-21 DIAGNOSIS — R350 Frequency of micturition: Secondary | ICD-10-CM

## 2017-12-21 DIAGNOSIS — E559 Vitamin D deficiency, unspecified: Secondary | ICD-10-CM | POA: Diagnosis not present

## 2017-12-21 DIAGNOSIS — N3281 Overactive bladder: Secondary | ICD-10-CM

## 2017-12-21 DIAGNOSIS — N3020 Other chronic cystitis without hematuria: Secondary | ICD-10-CM | POA: Diagnosis not present

## 2017-12-21 DIAGNOSIS — Z955 Presence of coronary angioplasty implant and graft: Secondary | ICD-10-CM | POA: Insufficient documentation

## 2017-12-21 DIAGNOSIS — Z8051 Family history of malignant neoplasm of kidney: Secondary | ICD-10-CM | POA: Insufficient documentation

## 2017-12-21 HISTORY — PX: CYSTOSCOPY WITH BIOPSY: SHX5122

## 2017-12-21 HISTORY — PX: BOTOX INJECTION: SHX5754

## 2017-12-21 LAB — GLUCOSE, CAPILLARY
GLUCOSE-CAPILLARY: 96 mg/dL (ref 70–99)
Glucose-Capillary: 113 mg/dL — ABNORMAL HIGH (ref 70–99)

## 2017-12-21 SURGERY — BOTOX INJECTION
Anesthesia: General | Site: Bladder

## 2017-12-21 MED ORDER — LIDOCAINE HCL (CARDIAC) PF 100 MG/5ML IV SOSY
PREFILLED_SYRINGE | INTRAVENOUS | Status: DC | PRN
  Administered 2017-12-21: 50 mg via INTRAVENOUS

## 2017-12-21 MED ORDER — LACTATED RINGERS IV SOLN
INTRAVENOUS | Status: DC | PRN
  Administered 2017-12-21: 12:00:00 via INTRAVENOUS

## 2017-12-21 MED ORDER — MEPERIDINE HCL 50 MG/ML IJ SOLN: 6.2500 mg | INTRAMUSCULAR | Status: DC | PRN

## 2017-12-21 MED ORDER — MIDAZOLAM HCL 2 MG/2ML IJ SOLN
INTRAMUSCULAR | Status: AC
  Filled 2017-12-21: qty 2

## 2017-12-21 MED ORDER — ONABOTULINUMTOXINA 100 UNITS IJ SOLR
100.0000 [IU] | Freq: Once | INTRAMUSCULAR | Status: DC
  Filled 2017-12-21: qty 100

## 2017-12-21 MED ORDER — PROPOFOL 10 MG/ML IV BOLUS
INTRAVENOUS | Status: DC | PRN
  Administered 2017-12-21: 160 mg via INTRAVENOUS

## 2017-12-21 MED ORDER — PROMETHAZINE HCL 25 MG/ML IJ SOLN: 6.2500 mg | INTRAMUSCULAR | Status: DC | PRN

## 2017-12-21 MED ORDER — ONABOTULINUMTOXINA 100 UNITS IJ SOLR
INTRAMUSCULAR | Status: DC | PRN
  Administered 2017-12-21: 100 [IU]

## 2017-12-21 MED ORDER — SODIUM CHLORIDE 0.9 % IV SOLN
INTRAVENOUS | Status: DC
  Administered 2017-12-21: 11:00:00 via INTRAVENOUS

## 2017-12-21 MED ORDER — CEFAZOLIN SODIUM-DEXTROSE 2-4 GM/100ML-% IV SOLN
INTRAVENOUS | Status: AC
  Filled 2017-12-21: qty 100

## 2017-12-21 MED ORDER — OXYCODONE HCL 5 MG/5ML PO SOLN: 5.0000 mg | Freq: Once | ORAL | Status: DC | PRN

## 2017-12-21 MED ORDER — FENTANYL CITRATE (PF) 100 MCG/2ML IJ SOLN: 25.0000 ug | INTRAMUSCULAR | Status: DC | PRN

## 2017-12-21 MED ORDER — PROPOFOL 10 MG/ML IV BOLUS
INTRAVENOUS | Status: AC
  Filled 2017-12-21: qty 20

## 2017-12-21 MED ORDER — OXYCODONE HCL 5 MG PO TABS: 5.0000 mg | ORAL_TABLET | Freq: Once | ORAL | Status: DC | PRN

## 2017-12-21 MED ORDER — FENTANYL CITRATE (PF) 100 MCG/2ML IJ SOLN
INTRAMUSCULAR | Status: AC
  Filled 2017-12-21: qty 2

## 2017-12-21 MED ORDER — MIDAZOLAM HCL 2 MG/2ML IJ SOLN
INTRAMUSCULAR | Status: DC | PRN
  Administered 2017-12-21: 2 mg via INTRAVENOUS

## 2017-12-21 SURGICAL SUPPLY — 16 items
BAG DRAIN CYSTO-URO LG1000N (MISCELLANEOUS) ×3 IMPLANT
CATH URETL 5X70 OPEN END (CATHETERS) IMPLANT
COVER WAND RF STERILE (DRAPES) IMPLANT
GLOVE BIO SURGEON STRL SZ 6.5 (GLOVE) ×2 IMPLANT
GLOVE BIO SURGEONS STRL SZ 6.5 (GLOVE) ×1
GOWN STRL REUS W/ TWL LRG LVL3 (GOWN DISPOSABLE) ×2 IMPLANT
GOWN STRL REUS W/TWL LRG LVL3 (GOWN DISPOSABLE) ×4
NEEDLE INJETAK FLEX 70CM BOTOX (NEEDLE) ×3 IMPLANT
PACK CYSTO AR (MISCELLANEOUS) ×3 IMPLANT
SENSORWIRE 0.038 NOT ANGLED (WIRE)
SET CYSTO W/LG BORE CLAMP LF (SET/KITS/TRAYS/PACK) ×3 IMPLANT
SOL .9 NS 3000ML IRR  AL (IV SOLUTION) ×2
SOL .9 NS 3000ML IRR UROMATIC (IV SOLUTION) ×1 IMPLANT
SURGILUBE 2OZ TUBE FLIPTOP (MISCELLANEOUS) ×3 IMPLANT
WATER STERILE IRR 1000ML POUR (IV SOLUTION) ×3 IMPLANT
WIRE SENSOR 0.038 NOT ANGLED (WIRE) IMPLANT

## 2017-12-21 NOTE — Anesthesia Preprocedure Evaluation (Signed)
Anesthesia Evaluation  Patient identified by MRN, date of birth, ID band Patient awake    Reviewed: Allergy & Precautions, NPO status , Patient's Chart, lab work & pertinent test results  History of Anesthesia Complications Negative for: history of anesthetic complications  Airway Mallampati: II  TM Distance: >3 FB Neck ROM: Full    Dental  (+) Lower Dentures, Upper Dentures   Pulmonary neg sleep apnea, neg COPD, former smoker,    breath sounds clear to auscultation- rhonchi (-) wheezing      Cardiovascular hypertension, Pt. on medications + CAD, + Past MI and + Cardiac Stents   Rhythm:Regular Rate:Normal - Systolic murmurs and - Diastolic murmurs    Neuro/Psych  Headaches, PSYCHIATRIC DISORDERS Anxiety Depression    GI/Hepatic Neg liver ROS, hiatal hernia, GERD  Medicated,  Endo/Other  diabetes  Renal/GU negative Renal ROS     Musculoskeletal negative musculoskeletal ROS (+)   Abdominal (+) + obese,   Peds  Hematology negative hematology ROS (+)   Anesthesia Other Findings Past Medical History: No date: Anxiety 11/16/2011: Arteriosclerosis of coronary artery     Comment:  Overview:  Stent 10/2011 stent rca 2015 with collaterals               to lad which is chronically occluded  No date: Benign enlargement of prostate 06/11/2014: Benign essential HTN 07/31/2014: Benign prostatic hypertrophy without urinary obstruction 05/16/2013: Bilateral cataracts     Comment:  Overview:  [facility]   11/09/2012: BP (high blood pressure) No date: Cancer ([place])     Comment:  skin (forehead) and bladder 02/08/2014: Carotid artery narrowing No date: Depression No date: Detrusor hypertrophy No date: Diabetes ([place]) 12/12/2012: Diabetes mellitus, type 2 (HCC) No date: Diverticulosis No date: Esophageal reflux No date: Esophageal reflux 08/07/2012: Fothergill's neuralgia     Comment:  Overview:  [facility]  No  date: Gastritis No date: GERD (gastroesophageal reflux disease) No date: Headache     Comment:  cluster headaches 11/09/2012: Healed myocardial infarct No date: Hearing loss in left ear No date: Heart disease No date: Hematuria No date: Hemorrhoids 12/14/2017: History of hiatal hernia     Comment:  small  No date: Hypercholesteremia No date: Lesion of bladder No date: Myocardial infarct ([place]) 11/09/2012: Presence of stent in coronary artery No date: Rectal bleeding No date: Trigeminal neuralgia No date: Trigeminal neuralgia No date: Valvular heart disease No date: Vitamin D deficiency   Reproductive/Obstetrics                             Anesthesia Physical Anesthesia Plan  ASA: III  Anesthesia Plan: General   Post-op Pain Management:    Induction: Intravenous  PONV Risk Score and Plan: 1 and Ondansetron and Midazolam  Airway Management Planned: LMA  Additional Equipment:   Intra-op Plan:   Post-operative Plan:   Informed Consent: I have reviewed the patients History and Physical, chart, labs and discussed the procedure including the risks, benefits and alternatives for the proposed anesthesia with the patient or authorized representative who has indicated his/her understanding and acceptance.   Dental advisory given  Plan Discussed with: CRNA and Anesthesiologist  Anesthesia Plan Comments:         Anesthesia Quick Evaluation

## 2017-12-21 NOTE — Anesthesia Post-op Follow-up Note (Signed)
Anesthesia QCDR form completed.        

## 2017-12-21 NOTE — Anesthesia Procedure Notes (Signed)
Procedure Name: LMA Insertion Date/Time: 12/21/2017 11:46 AM Performed by: Lesle Reek, CRNA Pre-anesthesia Checklist: Patient identified Oxygen Delivery Method: Circle system utilized Preoxygenation: Pre-oxygenation with 100% oxygen Induction Type: IV induction LMA Size: 4.5 Number of attempts: 1 Placement Confirmation: breath sounds checked- equal and bilateral,  CO2 detector and positive ETCO2 Tube secured with: Tape

## 2017-12-21 NOTE — Anesthesia Postprocedure Evaluation (Signed)
Anesthesia Post Note  Patient: [name]  Procedure(s) Performed: Bladder BOTOX INJECTION (N/A Bladder) CYSTOSCOPY WITH BIOPSY (N/A Bladder)  Patient location during evaluation: PACU Anesthesia Type: General Level of consciousness: awake and alert and oriented Pain management: pain level controlled Vital Signs Assessment: post-procedure vital signs reviewed and stable Respiratory status: spontaneous breathing, nonlabored ventilation and respiratory function stable Cardiovascular status: blood pressure returned to baseline and stable Postop Assessment: no signs of nausea or vomiting Anesthetic complications: no     Last Vitals:  Vitals:   12/21/17 1230 12/21/17 1245  BP: (!) 152/83 (!) 143/62  Pulse: (!) 55 60  Resp: (!) 21 17  Temp:  (!) 36.4 C  SpO2: 100% 95%    Last Pain:  Vitals:   12/21/17 1245  TempSrc:   PainSc: 0-No pain                 Aviv Lengacher

## 2017-12-21 NOTE — Interval H&P Note (Deleted)
History and Physical Interval Note:  12/21/2017 11:01 AM  [name]  has presented today for surgery, with the diagnosis of urinary frequency  The various methods of treatment have been discussed with the patient and family. After consideration of risks, benefits and other options for treatment, the patient has consented to  Procedure(s): Bladder BOTOX INJECTION (N/A) as a surgical intervention .  The patient's history has been reviewed, patient examined, no change in status, stable for surgery.  I have reviewed the patient's chart and labs.  Questions were answered to the patient's satisfaction.    RRR CTAB  He was able to demonstrate CIC without difficulty.    preop UCx negative  [name]

## 2017-12-21 NOTE — Interval H&P Note (Signed)
History and Physical Interval Note:  12/21/2017 11:04 AM  [name]  has presented today for surgery, with the diagnosis of urinary frequency  The various methods of treatment have been discussed with the patient and family. After consideration of risks, benefits and other options for treatment, the patient has consented to  Procedure(s): Bladder BOTOX INJECTION (N/A) as a surgical intervention .  The patient's history has been reviewed, patient examined, no change in status, stable for surgery.  I have reviewed the patient's chart and labs.  Questions were answered to the patient's satisfaction.    RRR CTAB  +preop urine culture, started on appropriate abx macrobid bid x 7 days  Able to demonstrate CIC sucessfully  [name]

## 2017-12-21 NOTE — Transfer of Care (Signed)
Immediate Anesthesia Transfer of Care Note  Patient: [name]  Procedure(s) Performed: Bladder BOTOX INJECTION (N/A Bladder) CYSTOSCOPY WITH BIOPSY (N/A Bladder)  Patient Location: PACU  Anesthesia Type:General  Level of Consciousness: awake  Airway & Oxygen Therapy: Patient Spontanous Breathing and Patient connected to face mask oxygen  Post-op Assessment: Report given to RN  Post vital signs: Reviewed and stable  Last Vitals:  Vitals Value Taken Time  BP 150/82 12/21/2017 12:15 PM  Temp    Pulse 57 12/21/2017 12:16 PM  Resp 13 12/21/2017 12:16 PM  SpO2 99 % 12/21/2017 12:16 PM  Vitals shown include unvalidated device data.  Last Pain:  Vitals:   12/21/17 1033  TempSrc: Temporal  PainSc: 0-No pain         Complications: No apparent anesthesia complications

## 2017-12-21 NOTE — Op Note (Signed)
Date of procedure: 12/21/17  Preoperative diagnosis:  1. Refractory OAB  Postoperative diagnosis:  1. Same as above 2. Bladder erythema  Procedure: 1. Cystoscopy 2. Intravesical injection of botulinum toxin 3. Bladder biopsy  Surgeon: [name], MD  Anesthesia: General  Complications: None  Intraoperative findings: Uncomplicated procedure.  Nonspecific bladder erythema in the setting of recent [name] diagnosed and treated cystitis.  Biopsied as precaution.  EBL: Minimal  Specimens: None  Drains: None  Indication: [name] is a 72 y.o. patient with severe refractory OAB symptoms who is elected to pursue Botox.  After reviewing the management options for treatment, the patient elected to proceed with the above surgical procedure(s). We have discussed the potential benefits and risks of the procedure, side effects of the proposed treatment, the likelihood of the patient achieving the goals of the procedure, and any potential problems that might occur during the procedure or recuperation. Informed consent has been obtained.  Description of procedure:  The patient was taken to the operating room and general anesthesia was induced.  The patient was placed in the dorsal lithotomy position, prepped and draped in the usual sterile fashion, and preoperative antibiotics were administered. A preoperative time-out was performed.   A 21 French rigid cystoscope was advanced per urethra into the bladder.  The bladder was carefully inspected and was noted to have some diffuse erythema consistent with recent bleed treated cystitis.  There is little concern for malignancy.  There are no papillary lesions appreciated.  As a precaution, cold cup forceps were used to biopsy a small area of mucosa on the posterior bladder wall an area of particular erythema as a precaution.  This was fulgurated using Bugbee electrocautery.  Notably, the bladder was noted to be heavily trabeculated.  The trigone was  normal.. At this point time, a total of 100 units of botulinum toxin was injected into the muscularis propria of the bladder in a total of 20 0.5 cc injections into [name] across the posterior bladder wall.  The procedure was uncomplicated.  Hemostasis was excellent.  The bladder was drained and the scope was removed.  The patient was then clean and dry, repositioned the supine position, reversed by anesthesia, and taken to the PACU in stable condition  Plan: Patient will follow up in a few weeks.  They have been taught how to do catheterize themselves intermittently as needed if they are unable to void and will contact us in this situation for guidance.  I will call with pathology results as I suspect they will be benign.  [name], M.D.

## 2017-12-21 NOTE — Discharge Instructions (Addendum)
Botulinum Toxin Bladder Injection, Care After Refer to this sheet in the next few weeks. These instructions provide you with information about caring for yourself after your procedure. Your health care provider may also give you more specific instructions. Your treatment has been planned according to current medical practices, but problems sometimes occur. Call your health care provider if you have any problems or questions after your procedure. What can I expect after the procedure? After the procedure, it is common to have:  Blood-tinged urine.  Burning or soreness when you pass urine.  Follow these instructions at home:   Do not drive for 24 hours if you received a sedative.  Take over-the-counter and prescription medicines only as told by your health care provider.  If you were prescribed an antibiotic medicine, take it as told by your health care provider. Do not stop taking the antibiotic even if you start to feel better.  Drink enough fluid to keep your urine clear or pale yellow.  Return to your normal activities as told by your health care provider. Ask your health care provider what activities are safe for you.  Keep all follow-up visits as told by your health care provider. This is important. Contact a health care provider if:  You have a fever or chills.  You have blood-tinged urine for more than one day after your procedure.  You have worsening pain or burning when you pass urine.  You have pain or burning when passing urine for more than two days after your procedure.  You have trouble emptying your bladder. Get help right away if:  You have bright red blood in your urine.  You are unable to pass urine. This information is not intended to replace advice given to you by your health care provider. Make sure you discuss any questions you have with your health care provider. Document Released: 11/06/2014 Document Revised: 09/05/2015 Document Reviewed:  08/14/2014 Elsevier Interactive Patient Education  2018 [employer]   1) The drugs that you were given will stay in your system until tomorrow so for the next 24 hours you should not:  A) Drive an automobile B) Make any legal decisions C) Drink any alcoholic beverage   2) You may resume regular meals tomorrow.  Today it is better to start with liquids and gradually work up to solid foods.  You may eat anything you prefer, but it is better to start with liquids, then soup and crackers, and gradually work up to solid foods.   3) Please notify your doctor immediately if you have any unusual bleeding, trouble breathing, redness and pain at the surgery site, drainage, fever, or pain not relieved by medication.    4) Additional Instructions:        Please contact your physician with any problems or Same Day Surgery at [phone], Monday through Friday 6 am to 4 pm, or [place] at [facility] number at [phone].   Cystoscopy, Care After Refer to this sheet in the next few weeks. These instructions provide you with information about caring for yourself after your procedure. Your health care provider may also give you more specific instructions. Your treatment has been planned according to current medical practices, but problems sometimes occur. Call your health care provider if you have any problems or questions after your procedure. What can I expect after the procedure? After the procedure, it is common to have:  Mild pain when you urinate. Pain should stop  within a few minutes after you urinate. This may last for up to 1 week.  A small amount of blood in your urine for several days.  Feeling like you need to urinate but producing only a small amount of urine.  Follow these instructions at home:  Medicines  Take over-the-counter and prescription medicines only as told by your health care provider.  If you were  prescribed an antibiotic medicine, take it as told by your health care provider. Do not stop taking the antibiotic even if you start to feel better. General instructions   Return to your normal activities as told by your health care provider. Ask your health care provider what activities are safe for you.  Do not drive for 24 hours if you received a sedative.  Watch for any blood in your urine. If the amount of blood in your urine increases, call your health care provider.  Follow instructions from your health care provider about eating or drinking restrictions.  If a tissue sample was removed for testing (biopsy) during your procedure, it is your responsibility to get your test results. Ask your health care provider or the department performing the test when your results will be ready.  Drink enough fluid to keep your urine clear or pale yellow.  Keep all follow-up visits as told by your health care provider. This is important. Contact a health care provider if:  You have pain that gets worse or does not get better with medicine, especially pain when you urinate.  You have difficulty urinating. Get help right away if:  You have more blood in your urine.  You have blood clots in your urine.  You have abdominal pain.  You have a fever or chills.  You are unable to urinate. This information is not intended to replace advice given to you by your health care provider. Make sure you discuss any questions you have with your health care provider. Document Released: 09/04/2004 Document Revised: 07/24/2015 Document Reviewed: 01/02/2015 Elsevier Interactive Patient Education  2018 [employer].   Botulinum Toxin Bladder Injection, Care After Refer to this sheet in the next few weeks. These instructions provide you with information about caring for yourself after your procedure. Your health care provider may also give you more specific instructions. Your treatment has been planned  according to current medical practices, but problems sometimes occur. Call your health care provider if you have any problems or questions after your procedure. What can I expect after the procedure? After the procedure, it is common to have:  Blood-tinged urine.  Burning or soreness when you pass urine.  Follow these instructions at home:   Do not drive for 24 hours if you received a sedative.  Take over-the-counter and prescription medicines only as told by your health care provider.  If you were prescribed an antibiotic medicine, take it as told by your health care provider. Do not stop taking the antibiotic even if you start to feel better.  Drink enough fluid to keep your urine clear or pale yellow.  Return to your normal activities as told by your health care provider. Ask your health care provider what activities are safe for you.  Keep all follow-up visits as told by your health care provider. This is important. Contact a health care provider if:  You have a fever or chills.  You have blood-tinged urine for more than one day after your procedure.  You have worsening pain or burning when you pass urine.  You  have pain or burning when passing urine for more than two days after your procedure.  You have trouble emptying your bladder. Get help right away if:  You have bright red blood in your urine.  You are unable to pass urine. This information is not intended to replace advice given to you by your health care provider. Make sure you discuss any questions you have with your health care provider. Document Released: 11/06/2014 Document Revised: 09/05/2015 Document Reviewed: 08/14/2014 Elsevier Interactive Patient Education  [employer].

## 2017-12-22 ENCOUNTER — Encounter: Payer: Self-pay | Admitting: Urology

## 2017-12-22 LAB — SURGICAL PATHOLOGY

## 2017-12-23 ENCOUNTER — Telehealth: Payer: Self-pay

## 2017-12-23 ENCOUNTER — Ambulatory Visit: Admission: RE | Admit: 2017-12-23 | Payer: Medicare HMO | Source: Ambulatory Visit

## 2017-12-23 NOTE — Telephone Encounter (Signed)
Pt informed

## 2017-12-23 NOTE — Telephone Encounter (Signed)
-----   Message from [name], MD sent at 12/23/2017  1:34 PM EDT ----- Please let this patient know bladder biopsy was benign.    [name], MD

## 2018-01-18 ENCOUNTER — Encounter: Payer: Self-pay | Admitting: Urology

## 2018-01-18 ENCOUNTER — Ambulatory Visit: Payer: Medicare HMO | Admitting: Urology

## 2018-01-18 VITALS — BP 98/60 | HR 80 | Ht 67.0 in | Wt 227.0 lb

## 2018-01-18 DIAGNOSIS — Z8551 Personal history of malignant neoplasm of bladder: Secondary | ICD-10-CM

## 2018-01-18 DIAGNOSIS — N3281 Overactive bladder: Secondary | ICD-10-CM | POA: Diagnosis not present

## 2018-01-18 LAB — BLADDER SCAN AMB NON-IMAGING: Scan Result: 0

## 2018-01-18 NOTE — Progress Notes (Signed)
01/18/2018 9:29 AM   [name] [birth year] [id number]  Referring provider: Ricardo Jericho, NP [address] [place], [place] [address]  Chief Complaint  Patient presents with  . Bladder Cancer    HPI: Patient is a 72 year old Caucasian male with a history of bladder cancer and refractory OAB who presents today for follow up after Botox treatment.  History of bladder cancer 72 year old male with a history of bladder cancer status post recent cystoscopy on 11/15/2017.  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  He underwent TURBT on 12/2013 with pathology consistent with low-grade TA cc.  He is been on surveillance since without recurrence.  He is now on annual cystoscopy. (10/2018)  Refractory OAB He was previously treated with oxybutynin by another provider.  He called our office several months ago requesting refill for oxybutynin for refractory urgency and urge incontinence.  At last visit, he was discovered to have new onset dementia and has been recently started on Namenda.  As such, anticholinergics are contraindicated.  He was given samples of Myrbetriq 25 mg which he has been taking.  He does not think that the have helped.  He does report that he is taking this medication in the past and it was previously helpful.  He had difficulty affording the medication.  He underwent Botox injection on 12/21/2017 with Dr. Erlene Quan.  He states that his symptoms are 100% better, but he feels he is not at goal.  His strong urgency is subdued, but he is still having some urge incontinence.  He has went from "gushing" urine to dribbling urine.  He has not had to cath himself since the Botox injection.  Patient denies any gross hematuria, dysuria or suprapubic/flank pain.  Patient denies any fevers, chills, nausea or vomiting.  His PVR is 0 mL.     PMH: Past Medical History:  Diagnosis Date  .  Anxiety   . Arteriosclerosis of coronary artery 11/16/2011   Overview:  Stent 10/2011 stent rca 2015 with collaterals to lad which is chronically occluded   . Benign enlargement of prostate   . Benign essential HTN 06/11/2014  . Benign prostatic hypertrophy without urinary obstruction 07/31/2014  . Bilateral cataracts 05/16/2013   Overview:  Dr. Tobe Sos [facility]    . BP (high blood pressure) 11/09/2012  . Cancer ([place])    skin (forehead) and bladder  . Carotid artery narrowing 02/08/2014  . Depression   . Detrusor hypertrophy   . Diabetes ([place])   . Diabetes mellitus, type 2 ([place]) 12/12/2012  . Diverticulosis   . Esophageal reflux   . Esophageal reflux   . Fothergill's neuralgia 08/07/2012   Overview:  [facility]   . Gastritis   . GERD (gastroesophageal reflux disease)   . Headache    cluster headaches  . 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 Hearing loss in left ear   . Heart disease   . Hematuria   . Hemorrhoids   . History of hiatal hernia 12/14/2017   small   . Hypercholesteremia   . Lesion of bladder   . Myocardial infarct ([place])   . Presence of stent in coronary artery 11/09/2012  . Rectal bleeding   . Trigeminal neuralgia   . Trigeminal neuralgia   . Valvular heart disease   . Vitamin D deficiency     Surgical History: Past Surgical History:  Procedure Laterality Date  .  APPENDECTOMY    . BOTOX INJECTION N/A 12/21/2017   Procedure: Bladder BOTOX INJECTION;  Surgeon: [name], MD;  Location: [facility] ORS;  Service: Urology;  Laterality: N/A;  . CARDIAC CATHETERIZATION    . CARDIAC CATHETERIZATION N/A 01/22/2015   Procedure: Left Heart Cath;  Surgeon: Corey Skains, MD;  Location: [place] CV LAB;  Service: Cardiovascular;  Laterality: N/A;  . CARDIAC CATHETERIZATION N/A 01/22/2015   Procedure: Coronary Stent Intervention;  Surgeon: Isaias Cowman, MD;  Location: [place] CV LAB;  Service: Cardiovascular;  Laterality: N/A;  . CATARACT  EXTRACTION, BILATERAL    . COLONOSCOPY WITH PROPOFOL N/A 12/08/2015   Procedure: COLONOSCOPY WITH PROPOFOL;  Surgeon: Lollie Sails, MD;  Location: [facility] ENDOSCOPY;  Service: Endoscopy;  Laterality: N/A;  . COLONOSCOPY WITH PROPOFOL N/A 12/09/2015   Procedure: COLONOSCOPY WITH PROPOFOL;  Surgeon: Lollie Sails, MD;  Location: [facility] ENDOSCOPY;  Service: Endoscopy;  Laterality: N/A;  . CORONARY ANGIOPLASTY     5 stents  . CORONARY STENT PLACEMENT  2015   x5  . CYSTOSCOPY WITH BIOPSY N/A 12/21/2017   Procedure: CYSTOSCOPY WITH BIOPSY;  Surgeon: [name], MD;  Location: [facility] ORS;  Service: Urology;  Laterality: N/A;  . ESOPHAGOGASTRODUODENOSCOPY (EGD) WITH PROPOFOL N/A 12/08/2015   Procedure: ESOPHAGOGASTRODUODENOSCOPY (EGD) WITH PROPOFOL;  Surgeon: Lollie Sails, MD;  Location: [facility] ENDOSCOPY;  Service: Endoscopy;  Laterality: N/A;  . ESOPHAGOGASTRODUODENOSCOPY (EGD) WITH PROPOFOL N/A 12/13/2017   Procedure: ESOPHAGOGASTRODUODENOSCOPY (EGD) WITH PROPOFOL;  Surgeon: Lollie Sails, MD;  Location: [facility] ENDOSCOPY;  Service: Endoscopy;  Laterality: N/A;  . EYE SURGERY    . HERNIA REPAIR    . kidney tumor remove    . TRANSURETHRAL RESECTION OF BLADDER TUMOR WITH GYRUS (TURBT-GYRUS)  12/2013  . UMBILICAL HERNIA REPAIR    . urethral meatotomy      Home Medications:  Allergies as of 01/18/2018      Reactions   Ace Inhibitors Cough   Lyrica [pregabalin] Rash      Medication List        Accurate as of 01/18/18  9:29 AM. Always use your most recent med list.          aspirin 81 MG tablet Take 81 mg by mouth daily.   citalopram 20 MG tablet Commonly known as:  CELEXA Take 20 mg by mouth daily.   clopidogrel 75 MG tablet Commonly known as:  PLAVIX Take 75 mg by mouth daily.   gabapentin 100 MG capsule Commonly known as:  NEURONTIN Take 100 mg by mouth 3 (three) times daily.   gabapentin 300 MG capsule Commonly known as:  NEURONTIN Take 1,200 mg by mouth 2  (two) times daily.   isosorbide mononitrate 30 MG 24 hr tablet Commonly known as:  IMDUR Take 60 mg by mouth daily.   memantine 5 MG tablet Commonly known as:  NAMENDA Take 5 mg by mouth daily.   mirabegron ER 25 MG Tb24 tablet Commonly known as:  MYRBETRIQ Take 1 tablet (25 mg total) by mouth daily.   multivitamins with iron Tabs tablet Take 1 tablet by mouth daily.   nitrofurantoin (macrocrystal-monohydrate) 100 MG capsule Commonly known as:  MACROBID Take 1 capsule (100 mg total) by mouth every 12 (twelve) hours.   nitroGLYCERIN 0.4 MG SL tablet Commonly known as:  NITROSTAT Place under the tongue.   Omega-3 1000 MG Caps Take 1 g by mouth daily. Reported on 03/21/2015   pantoprazole 40 MG tablet Commonly known as:  PROTONIX Take 40 mg by mouth daily.   pramipexole 1 MG tablet Commonly known as:  MIRAPEX Take 1 mg by mouth daily.   pravastatin 80 MG tablet Commonly known as:  PRAVACHOL TAKE 1 TABLET (80 MG TOTAL) BY MOUTH NIGHTLY.   RA BLOOD GLUCOSE MONITOR Devi by Does not apply route.   sildenafil 50 MG tablet Commonly known as:  VIAGRA Take 50 mg by mouth daily as needed for erectile dysfunction.   TRILEPTAL 150 MG tablet Generic drug:  OXcarbazepine Take 150 mg by mouth daily.   VITAMIN D-1000 MAX ST 25 MCG (1000 UT) tablet Generic drug:  Cholecalciferol Take 1,000 Units by mouth daily.       Allergies:  Allergies  Allergen Reactions  . Ace Inhibitors Cough  . Lyrica [Pregabalin] Rash    Family History: Family History  Problem Relation Age of Onset  . Kidney cancer Mother   . Prostate cancer Neg Hx     Social History:  reports that he quit smoking about 28 years ago. His smoking use included cigarettes. He has a 80.00 pack-year smoking history. He has never used smokeless tobacco. He reports that he drinks alcohol. He reports that he does not use drugs.  ROS: UROLOGY Frequent Urination?: No Hard to postpone urination?: No Burning/pain  with urination?: No Get up at night to urinate?: No Leakage of urine?: No Urine stream starts and stops?: No Trouble starting stream?: No Do you have to strain to urinate?: No Blood in urine?: No Urinary tract infection?: No Sexually transmitted disease?: No Injury to kidneys or bladder?: No Painful intercourse?: No Weak stream?: No Erection problems?: No Penile pain?: No  Gastrointestinal Nausea?: No Vomiting?: No Indigestion/heartburn?: No Diarrhea?: No Constipation?: No  Constitutional Fever: No Night sweats?: No Weight loss?: No Fatigue?: No  Skin Skin rash/lesions?: No Itching?: No  Eyes Blurred vision?: No Double vision?: No  Ears/Nose/Throat Sore throat?: No Sinus problems?: Yes  Hematologic/Lymphatic Swollen glands?: No Easy bruising?: No  Cardiovascular Leg swelling?: No Chest pain?: No  Respiratory Cough?: No Shortness of breath?: No  Endocrine Excessive thirst?: No  Musculoskeletal Back pain?: No Joint pain?: No  Neurological Headaches?: No Dizziness?: No  Psychologic Depression?: No Anxiety?: No  Physical Exam: BP 98/60   Pulse 80   Ht 5\' 7"  (1.702 m)   Wt 227 lb (103 kg)   BMI 35.55 kg/m   Constitutional: Well nourished. Alert and oriented, No acute distress. HEENT: [place] AT, moist mucus membranes. Trachea midline, no masses. Cardiovascular: No clubbing, cyanosis, or edema. Respiratory: Normal respiratory effort, no increased work of breathing. Skin: No rashes, bruises or suspicious lesions. Lymph: No cervical or inguinal adenopathy. Neurologic: Grossly intact, no focal deficits, moving all 4 extremities. Psychiatric: Normal mood and affect.   Laboratory Data: Lab Results  Component Value Date   WBC 7.5 01/23/2015   HGB 13.5 01/23/2015   HCT 40.2 01/23/2015   MCV 76.8 (L) 01/23/2015   PLT 164 01/23/2015    Lab Results  Component Value Date   CREATININE 0.98 01/23/2015     Lab Results  Component Value Date    HGBA1C 6.8 (H) 05/22/2012  I have reviewed the labs.    Pertinent Imaging Results for orders placed or performed in visit on 01/18/18  Bladder Scan (Post Void Residual) in office  Result Value Ref Range   Scan Result 0     Assessment & Plan:    1. History of bladder cancer Due for cysto next year (11/14/2018)  Currently NED Keep next follow up  2. Refractory OAB S/P Botox  Patient feels 100% improvement, but he would like to be completely dry Discussed realistic goals and offered pelvic floor PT to help manage his mild urge incontinence - he deferred He would like to pursue Botox again in the future and will have a follow up appointment in ~ 5 months with Dr. Erlene Quan to discuss his next Botox treatment     Zara Council, [facility]  [place] [address], [place], [place] [address] [phone]

## 2018-02-06 ENCOUNTER — Ambulatory Visit: Payer: Medicare HMO

## 2018-02-06 ENCOUNTER — Encounter: Payer: Self-pay | Admitting: Podiatry

## 2018-02-07 NOTE — Progress Notes (Signed)
This encounter was created in error - please disregard.

## 2018-03-06 ENCOUNTER — Ambulatory Visit: Payer: Self-pay | Admitting: Podiatry

## 2018-03-24 DIAGNOSIS — R809 Proteinuria, unspecified: Secondary | ICD-10-CM | POA: Insufficient documentation

## 2018-03-24 DIAGNOSIS — E119 Type 2 diabetes mellitus without complications: Secondary | ICD-10-CM | POA: Insufficient documentation

## 2018-04-19 ENCOUNTER — Ambulatory Visit: Payer: Medicare Other | Admitting: Podiatry

## 2018-04-19 ENCOUNTER — Encounter: Payer: Self-pay | Admitting: Podiatry

## 2018-04-19 ENCOUNTER — Ambulatory Visit (INDEPENDENT_AMBULATORY_CARE_PROVIDER_SITE_OTHER): Payer: Medicare Other

## 2018-04-19 VITALS — BP 126/67 | HR 70 | Resp 16

## 2018-04-19 DIAGNOSIS — M7752 Other enthesopathy of left foot: Secondary | ICD-10-CM

## 2018-04-19 DIAGNOSIS — M7672 Peroneal tendinitis, left leg: Secondary | ICD-10-CM

## 2018-04-19 NOTE — Progress Notes (Signed)
Subjective:  Patient ID: [name], male    DOB: 01-29-1946,  MRN: [id number] HPI Chief Complaint  Patient presents with  . Ankle Pain    Lateral ankle left - aching x few months, palpable knot x 1 year, some swelling, no injury, pain intensity is intermittent, no treatment  . Diabetes    Last a1c - 6.8  . New Patient (Initial Visit)    73 y.o. male presents with the above complaint.   ROS: Denies fever chills nausea muscle aches pains calf pain back pain chest pain shortness of breath.  Past Medical History:  Diagnosis Date  . Anxiety   . Arteriosclerosis of coronary artery 11/16/2011   Overview:  Stent 10/2011 stent rca 2015 with collaterals to lad which is chronically occluded   . Benign enlargement of prostate   . Benign essential HTN 06/11/2014  . Benign prostatic hypertrophy without urinary obstruction 07/31/2014  . Bilateral cataracts 05/16/2013   Overview:  Dr. Tobe Sos [facility]    . BP (high blood pressure) 11/09/2012  . Cancer ([place])    skin (forehead) and bladder  . Carotid artery narrowing 02/08/2014  . Depression   . Detrusor hypertrophy   . Diabetes ([place])   . Diabetes mellitus, type 2 ([place]) 12/12/2012  . Diverticulosis   . Esophageal reflux   . Esophageal reflux   . Fothergill's neuralgia 08/07/2012   Overview:  [facility]   . Gastritis   . GERD (gastroesophageal reflux disease)   . Headache    cluster headaches  . 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 Hearing loss in left ear   . Heart disease   . Hematuria   . Hemorrhoids   . History of hiatal hernia 12/14/2017   small   . Hypercholesteremia   . Lesion of bladder   . Myocardial infarct ([place])   . Presence of stent in coronary artery 11/09/2012  . Rectal bleeding   . Trigeminal neuralgia   . Trigeminal neuralgia   . Valvular heart disease   . Vitamin D deficiency    Past Surgical History:  Procedure Laterality Date  . APPENDECTOMY    . BOTOX INJECTION N/A 12/21/2017   Procedure: Bladder  BOTOX INJECTION;  Surgeon: [name], MD;  Location: [facility] ORS;  Service: Urology;  Laterality: N/A;  . CARDIAC CATHETERIZATION    . CARDIAC CATHETERIZATION N/A 01/22/2015   Procedure: Left Heart Cath;  Surgeon: Corey Skains, MD;  Location: [place] CV LAB;  Service: Cardiovascular;  Laterality: N/A;  . CARDIAC CATHETERIZATION N/A 01/22/2015   Procedure: Coronary Stent Intervention;  Surgeon: Isaias Cowman, MD;  Location: [place] CV LAB;  Service: Cardiovascular;  Laterality: N/A;  . CATARACT EXTRACTION, BILATERAL    . COLONOSCOPY WITH PROPOFOL N/A 12/08/2015   Procedure: COLONOSCOPY WITH PROPOFOL;  Surgeon: Lollie Sails, MD;  Location: [facility] ENDOSCOPY;  Service: Endoscopy;  Laterality: N/A;  . COLONOSCOPY WITH PROPOFOL N/A 12/09/2015   Procedure: COLONOSCOPY WITH PROPOFOL;  Surgeon: Lollie Sails, MD;  Location: [facility] ENDOSCOPY;  Service: Endoscopy;  Laterality: N/A;  . CORONARY ANGIOPLASTY     5 stents  . CORONARY STENT PLACEMENT  2015   x5  . CYSTOSCOPY WITH BIOPSY N/A 12/21/2017   Procedure: CYSTOSCOPY WITH BIOPSY;  Surgeon: [name], MD;  Location: [facility] ORS;  Service: Urology;  Laterality: N/A;  . ESOPHAGOGASTRODUODENOSCOPY (EGD) WITH PROPOFOL N/A 12/08/2015   Procedure: ESOPHAGOGASTRODUODENOSCOPY (EGD) WITH PROPOFOL;  Surgeon: Lollie Sails, MD;  Location: [facility] ENDOSCOPY;  Service:  Endoscopy;  Laterality: N/A;  . ESOPHAGOGASTRODUODENOSCOPY (EGD) WITH PROPOFOL N/A 12/13/2017   Procedure: ESOPHAGOGASTRODUODENOSCOPY (EGD) WITH PROPOFOL;  Surgeon: Lollie Sails, MD;  Location: [facility] ENDOSCOPY;  Service: Endoscopy;  Laterality: N/A;  . EYE SURGERY    . HERNIA REPAIR    . kidney tumor remove    . TRANSURETHRAL RESECTION OF BLADDER TUMOR WITH GYRUS (TURBT-GYRUS)  12/2013  . UMBILICAL HERNIA REPAIR    . urethral meatotomy      Current Outpatient Medications:  .  aspirin 81 MG tablet, Take 81 mg by mouth daily., Disp: , Rfl:  .  Blood Glucose  Monitoring Suppl (RA BLOOD GLUCOSE MONITOR) DEVI, by Does not apply route., Disp: , Rfl:  .  Cholecalciferol (VITAMIN D-1000 MAX ST) 1000 UNITS tablet, Take 1,000 Units by mouth daily. , Disp: , Rfl:  .  citalopram (CELEXA) 20 MG tablet, Take 20 mg by mouth daily., Disp: , Rfl:  .  clopidogrel (PLAVIX) 75 MG tablet, Take 75 mg by mouth daily. , Disp: , Rfl:  .  gabapentin (NEURONTIN) 100 MG capsule, Take 100 mg by mouth 3 (three) times daily., Disp: , Rfl:  .  gabapentin (NEURONTIN) 300 MG capsule, Take 1,200 mg by mouth 2 (two) times daily., Disp: , Rfl:  .  isosorbide mononitrate (IMDUR) 30 MG 24 hr tablet, Take 60 mg by mouth daily. , Disp: , Rfl:  .  memantine (NAMENDA) 5 MG tablet, Take 5 mg by mouth daily. , Disp: , Rfl:  .  mirabegron ER (MYRBETRIQ) 25 MG TB24 tablet, Take 1 tablet (25 mg total) by mouth daily., Disp: 30 tablet, Rfl: 11 .  Multiple Vitamins-Iron (MULTIVITAMINS WITH IRON) TABS tablet, Take 1 tablet by mouth daily., Disp: , Rfl:  .  nitroGLYCERIN (NITROSTAT) 0.4 MG SL tablet, Place under the tongue., Disp: , Rfl:  .  Omega-3 1000 MG CAPS, Take 1 g by mouth daily. Reported on 03/21/2015, Disp: , Rfl:  .  ONE TOUCH ULTRA TEST test strip, USE 3 (THREE) TIMES DAILY USE AS INSTRUCTED.- ONE TOUCH ULTRA BLOOD GLUCOSE METER STRIPS, Disp: , Rfl:  .  OXcarbazepine (TRILEPTAL) 150 MG tablet, Take 150 mg by mouth daily. , Disp: , Rfl:  .  pantoprazole (PROTONIX) 40 MG tablet, Take 40 mg by mouth daily., Disp: , Rfl:  .  pramipexole (MIRAPEX) 1 MG tablet, Take 1 mg by mouth daily., Disp: , Rfl:  .  pravastatin (PRAVACHOL) 80 MG tablet, TAKE 1 TABLET (80 MG TOTAL) BY MOUTH NIGHTLY., Disp: , Rfl: 3 .  sildenafil (VIAGRA) 50 MG tablet, Take 50 mg by mouth daily as needed for erectile dysfunction., Disp: , Rfl:  .  valsartan (DIOVAN) 40 MG tablet, Take 40 mg by mouth daily., Disp: , Rfl:   Allergies  Allergen Reactions  . Ace Inhibitors Cough  . Lyrica [Pregabalin] Rash   Review of  Systems Objective:   Vitals:   04/19/18 0905  BP: 126/67  Pulse: 70  Resp: 16    General: Well developed, nourished, in no acute distress, alert and oriented x3   Dermatological: Skin is warm, dry and supple bilateral. Nails x 10 are well maintained; remaining integument appears unremarkable at this time. There are no open sores, no preulcerative lesions, no rash or signs of infection present.  Vascular: Dorsalis Pedis artery and Posterior Tibial artery pedal pulses are 2/4 bilateral with immedate capillary fill time. Pedal hair growth present. No varicosities and no lower extremity edema present bilateral.   Neruologic: Grossly  intact via light touch bilateral. Vibratory intact via tuning fork bilateral. Protective threshold with Semmes Wienstein monofilament intact to all pedal sites bilateral. Patellar and Achilles deep tendon reflexes 2+ bilateral. No Babinski or clonus noted bilateral.   Musculoskeletal: No gross boney pedal deformities bilateral. No pain, crepitus, or limitation noted with foot and ankle range of motion bilateral. Muscular strength 5/5 in all groups tested bilateral.  Moderate to severe pain on palpation of the peroneal tubercle and the peroneal tendons. Gait: Unassisted, Nonantalgic.    Radiographs:  Radiographs taken today demonstrate what appears to be hypertrophic peroneal tubercle left calcaneus with a spur.  Assessment & Plan:   Assessment: Spur of the peroneal tubercle left foot.  Possibly resulting in peroneal tendinitis.  Plan: At this point prior to any surgeries he would like to have an injection if possible.  I injected the area today with dexamethasone local anesthetic a total of 2 mg was injected to the point of maximal tenderness.  He tolerated procedure well without complications.     Max T. Calvert, [place]

## 2018-05-25 ENCOUNTER — Telehealth: Payer: Self-pay | Admitting: Urology

## 2018-05-25 NOTE — Telephone Encounter (Signed)
Pt states he can't wait for 4/22 appt, he is wetting all the time and wants to know if he can come in sooner.

## 2018-05-25 NOTE — Telephone Encounter (Signed)
Pt called and wants to know if we are still doing Botox treatments. Please advise.

## 2018-05-25 NOTE — Telephone Encounter (Signed)
Left message for patient to return call.

## 2018-05-26 NOTE — Telephone Encounter (Signed)
Pt called again and is wetting himself all the time.  He said it just starts running and he can't hold back.  Please call pt to advise. [phone]

## 2018-05-26 NOTE — Telephone Encounter (Signed)
Patient hasn't been seen since 12/2017. Do you have any suggestions to help him with OAB?

## 2018-05-26 NOTE — Telephone Encounter (Signed)
Is this patient on the list of keep appointments? Is 4/22 truly going to be his appt or did she want him pushed out further, that will determine how we answer his question. Thanks

## 2018-05-26 NOTE — Telephone Encounter (Signed)
See additional telephone encounter.

## 2018-06-19 ENCOUNTER — Telehealth (INDEPENDENT_AMBULATORY_CARE_PROVIDER_SITE_OTHER): Payer: Medicare Other | Admitting: Urology

## 2018-06-19 ENCOUNTER — Other Ambulatory Visit: Payer: Self-pay

## 2018-06-19 DIAGNOSIS — Z8551 Personal history of malignant neoplasm of bladder: Secondary | ICD-10-CM

## 2018-06-19 DIAGNOSIS — N3281 Overactive bladder: Secondary | ICD-10-CM

## 2018-06-19 NOTE — Progress Notes (Signed)
Virtual Visit via Video Note  I connected with [name] on 06/19/18 at 11:30 AM EDT by a video enabled telemedicine application and verified that I am speaking with the correct person using two identifiers.   I discussed the limitations of evaluation and management by telemedicine and the availability of in person appointments. The patient expressed understanding and agreed to proceed.  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  He is remote history of low risk TA bladder cancer 2015 without recurrence.  He is on annual cystoscopy due next 10/2018.    He has a personal history of urinary urgency, frequency and urge incontinence.  Anticholinergics were contraindicated due to worsening mental status/new diagnosis of dementia.  He is unable to afford Myrbetriq.  More recently, he underwent Botox injection on 11/2017 100 units.  He had a significant decrease in his urgency and urge incontinence episodes.  He was able to empty his bladder, PVR 0 and never needed to straight cath.  He reports today that he believes the medicine is now wearing off.  He has increasing episodes of gross incontinence which is quite bothersome to him.    Observations/Objective: Pleasant, accompanied by wife today by video.  Assessment and Plan:  1. OAB (overactive bladder) S/p Botox 11/2017 with significant provement her urinary symptoms Medication appears to be wearing off, interested in repeat injection Discussed the option of returning to the operating room for this procedure versus trial of in office injection He is interested in trying in the office We discussed that we will plan to instill lidocaine in his bladder at least a half an hour before the procedure which hopefully will help him tolerate the injections better We discussed possible side effects including UTI,  blood in the urine, intolerance of in office injections amongst others All of his questions were answered We will plan to obtain prior authorization, 100 units of the medication in the office and plan for in office injection in the near future  2. History of bladder cancer Continues annual cysto, due 10/2018   Follow Up Instructions: Schedule botox 100 u in office injection (prior auth)   I discussed the assessment and treatment plan with the patient. The patient was provided an opportunity to ask questions and all were answered. The patient agreed with the plan and demonstrated an understanding of the instructions.   The patient was advised to call back or seek an in-person evaluation if the symptoms worsen or if the condition fails to improve as anticipated.  I provided 15 minutes of non-face-to-face time during this encounter.   [name], MD

## 2018-06-21 ENCOUNTER — Ambulatory Visit: Payer: Medicare HMO | Admitting: Urology

## 2018-07-05 ENCOUNTER — Ambulatory Visit: Payer: Medicare Other | Admitting: Podiatry

## 2018-07-05 ENCOUNTER — Other Ambulatory Visit: Payer: Self-pay

## 2018-07-05 ENCOUNTER — Encounter: Payer: Self-pay | Admitting: Podiatry

## 2018-07-05 VITALS — Temp 98.2°F

## 2018-07-05 DIAGNOSIS — M7672 Peroneal tendinitis, left leg: Secondary | ICD-10-CM

## 2018-07-05 NOTE — Progress Notes (Signed)
He presents today for follow-up of his peroneal tendinitis left he states that it still hurts.  Objective: Vital signs are stable he is alert and oriented x3 large palpable peroneal tubercle with inflammation of the peroneal tendons.  Plan: At this point I reinjected the area with Kenalog 10 mg and local anesthetic instrument of skin prep I will follow-up with him in about a month and decide whether or not we need an MRI.

## 2018-07-10 DIAGNOSIS — T148XXA Other injury of unspecified body region, initial encounter: Secondary | ICD-10-CM | POA: Insufficient documentation

## 2018-07-11 ENCOUNTER — Telehealth: Payer: Self-pay | Admitting: Urology

## 2018-07-11 NOTE — Telephone Encounter (Signed)
Should I hand this off to Amy or is pt not a candidate for OR? Please advise

## 2018-07-11 NOTE — Telephone Encounter (Signed)
Totally fine have this in the operating room but will likely have to wait to mid June at the earliest.  Amy, please feel a booking sheet to the best of your ability, he will need a preop UA/urine culture as well as the routine COVID testing which is now required.  Ancef 2 g.  SCDs.  I will sign a booking sheet tomorrow.  [name], MD

## 2018-07-11 NOTE — Telephone Encounter (Signed)
Pt called and states that he has decided to get the Botox and would like to do it at the hospital instead of coming into the office. Please advise.

## 2018-07-26 ENCOUNTER — Ambulatory Visit: Payer: Medicare Other | Admitting: Podiatry

## 2018-08-03 ENCOUNTER — Other Ambulatory Visit: Payer: Self-pay | Admitting: Radiology

## 2018-08-03 DIAGNOSIS — N3281 Overactive bladder: Secondary | ICD-10-CM

## 2018-08-03 MED ORDER — ONABOTULINUMTOXINA 100 UNITS IJ SOLR: 100.0000 [IU] | INTRAMUSCULAR | Status: AC

## 2018-08-16 ENCOUNTER — Other Ambulatory Visit: Payer: Self-pay | Admitting: Radiology

## 2018-08-16 DIAGNOSIS — N3281 Overactive bladder: Secondary | ICD-10-CM

## 2018-08-16 MED ORDER — ONABOTULINUMTOXINA 100 UNITS IJ SOLR: 100.0000 [IU] | INTRAMUSCULAR | Status: AC

## 2018-08-31 ENCOUNTER — Other Ambulatory Visit: Payer: Self-pay

## 2018-08-31 ENCOUNTER — Other Ambulatory Visit: Payer: Medicare Other

## 2018-08-31 DIAGNOSIS — N3281 Overactive bladder: Secondary | ICD-10-CM

## 2018-08-31 LAB — URINALYSIS, COMPLETE
Bilirubin, UA: NEGATIVE
Glucose, UA: NEGATIVE
Ketones, UA: NEGATIVE
Leukocytes,UA: NEGATIVE
Nitrite, UA: NEGATIVE
Protein,UA: NEGATIVE
RBC, UA: NEGATIVE
Specific Gravity, UA: 1.025 (ref 1.005–1.030)
Urobilinogen, Ur: 1 mg/dL (ref 0.2–1.0)
pH, UA: 5.5 (ref 5.0–7.5)

## 2018-08-31 LAB — MICROSCOPIC EXAMINATION
Bacteria, UA: NONE SEEN
RBC: NONE SEEN /hpf (ref 0–2)

## 2018-09-03 LAB — CULTURE, URINE COMPREHENSIVE

## 2018-09-07 ENCOUNTER — Encounter
Admission: RE | Admit: 2018-09-07 | Discharge: 2018-09-07 | Disposition: A | Discharge status: Home / Self Care | Payer: Medicare Other | Source: Ambulatory Visit | Attending: Urology | Admitting: Urology

## 2018-09-07 ENCOUNTER — Other Ambulatory Visit: Payer: Self-pay

## 2018-09-07 DIAGNOSIS — Z1159 Encounter for screening for other viral diseases: Secondary | ICD-10-CM | POA: Insufficient documentation

## 2018-09-07 DIAGNOSIS — Z01812 Encounter for preprocedural laboratory examination: Secondary | ICD-10-CM | POA: Insufficient documentation

## 2018-09-07 HISTORY — DX: Dyspnea, unspecified: R06.00

## 2018-09-07 HISTORY — DX: Other enthesopathy of unspecified foot and ankle: M77.50

## 2018-09-07 NOTE — Pre-Procedure Instructions (Signed)
Cardiac clearance on chart, Dr Nehemiah Massed.

## 2018-09-07 NOTE — Patient Instructions (Signed)
Your procedure is scheduled on: 09/11/2018 Mon Report to Same Day Surgery 2nd floor medical mall [facility] Entrance-take elevator on left to 2nd floor.  Check in with surgery information desk.) To find out your arrival time please call [phone] between 1PM - 3PM on 09/08/2018 Fri  Remember: Instructions that are not followed completely may result in serious medical risk, up to and including death, or upon the discretion of your surgeon and anesthesiologist your surgery may need to be rescheduled.    _x___ 1. Do not eat food after midnight the night before your procedure. You may drink clear liquids up to 2 hours before you are scheduled to arrive at the hospital for your procedure.  Do not drink clear liquids within 2 hours of your scheduled arrival to the hospital.  Clear liquids include  --Water or Apple juice without pulp  --Clear carbohydrate beverage such as ClearFast or Gatorade  --Black Coffee or Clear Tea (No milk, no creamers, do not add anything to                  the coffee or Tea Type 1 and type 2 diabetics should only drink water.   ____Ensure clear carbohydrate drink on the way to the hospital for bariatric patients  ____Ensure clear carbohydrate drink 3 hours before surgery for Dr Dwyane Luo patients if physician instructed.   No gum chewing or hard candies.     __x__ 2. No Alcohol for 24 hours before or after surgery.   __x__3. No Smoking or e-cigarettes for 24 prior to surgery.  Do not use any chewable tobacco products for at least 6 hour prior to surgery   ____  4. Bring all medications with you on the day of surgery if instructed.    __x__ 5. Notify your doctor if there is any change in your medical condition     (cold, fever, infections).    x___6. On the morning of surgery brush your teeth with toothpaste and water.  You may rinse your mouth with mouth wash if you wish.  Do not swallow any toothpaste or mouthwash.   Do not wear jewelry, make-up, hairpins,  clips or nail polish.  Do not wear lotions, powders, or perfumes. You may wear deodorant.  Do not shave 48 hours prior to surgery. Men may shave face and neck.  Do not bring valuables to the hospital.    [facility] is not responsible for any belongings or valuables.               Contacts, dentures or bridgework may not be worn into surgery.  Leave your suitcase in the car. After surgery it may be brought to your room.  For patients admitted to the hospital, discharge time is determined by your                       treatment team.  _  Patients discharged the day of surgery will not be allowed to drive home.  You will need someone to drive you home and stay with you the night of your procedure.    Please read over the following fact sheets that you were given:   [facility] Preparing for Surgery and or MRSA Information   _x___ Take anti-hypertensive listed below, cardiac, seizure, asthma,     anti-reflux and psychiatric medicines. These include:  1. gabapentin (NEURONTIN  2.isosorbide mononitrate (IMDUR) 30 MG 24 hr tablet  3.memantine (NAMENDA)   4.pantoprazole (PROTONIX) 40  MG tablet  5.  6.  ____Fleets enema or Magnesium Citrate as directed.   ____ Use CHG Soap or sage wipes as directed on instruction sheet   ____ Use inhalers on the day of surgery and bring to hospital day of surgery  ____ Stop Metformin and Janumet 2 days prior to surgery.    ____ Take 1/2 of usual insulin dose the night before surgery and none on the morning     surgery.   _x___ Follow recommendations from Cardiologist, Pulmonologist or PCP regarding          stopping Aspirin, Coumadin, Plavix ,Eliquis, Effient, or Pradaxa, and Pletal.  X____Stop Anti-inflammatories such as Advil, Aleve, Ibuprofen, Motrin, Naproxen, Naprosyn, Goodies powders or aspirin products. OK to take Tylenol and                          Celebrex.   _x___ Stop supplements until after surgery.  But may continue Vitamin D, Vitamin B,        and multivitamin.   ____ Bring C-Pap to the hospital.

## 2018-09-08 LAB — SARS CORONAVIRUS 2 (TAT 6-24 HRS): SARS Coronavirus 2: NEGATIVE

## 2018-09-10 MED ORDER — CEFAZOLIN SODIUM-DEXTROSE 2-4 GM/100ML-% IV SOLN
2.0000 g | INTRAVENOUS | Status: AC
  Administered 2018-09-11: 2 g via INTRAVENOUS

## 2018-09-11 ENCOUNTER — Ambulatory Visit: Payer: Medicare Other | Admitting: Anesthesiology

## 2018-09-11 ENCOUNTER — Other Ambulatory Visit: Payer: Self-pay

## 2018-09-11 ENCOUNTER — Encounter
Admission: RE | Disposition: A | Discharge status: Home / Self Care | Payer: Self-pay | Source: Home / Self Care | Attending: Urology

## 2018-09-11 ENCOUNTER — Encounter: Payer: Self-pay | Admitting: *Deleted

## 2018-09-11 ENCOUNTER — Ambulatory Visit
Admission: RE | Admit: 2018-09-11 | Discharge: 2018-09-11 | Disposition: A | Discharge status: Home / Self Care | Payer: Medicare Other | Attending: Urology | Admitting: Urology

## 2018-09-11 DIAGNOSIS — Z8551 Personal history of malignant neoplasm of bladder: Secondary | ICD-10-CM | POA: Diagnosis not present

## 2018-09-11 DIAGNOSIS — Z7982 Long term (current) use of aspirin: Secondary | ICD-10-CM | POA: Insufficient documentation

## 2018-09-11 DIAGNOSIS — E119 Type 2 diabetes mellitus without complications: Secondary | ICD-10-CM | POA: Insufficient documentation

## 2018-09-11 DIAGNOSIS — I10 Essential (primary) hypertension: Secondary | ICD-10-CM | POA: Insufficient documentation

## 2018-09-11 DIAGNOSIS — Z9842 Cataract extraction status, left eye: Secondary | ICD-10-CM | POA: Insufficient documentation

## 2018-09-11 DIAGNOSIS — E559 Vitamin D deficiency, unspecified: Secondary | ICD-10-CM | POA: Insufficient documentation

## 2018-09-11 DIAGNOSIS — E7800 Pure hypercholesterolemia, unspecified: Secondary | ICD-10-CM | POA: Insufficient documentation

## 2018-09-11 DIAGNOSIS — Z79899 Other long term (current) drug therapy: Secondary | ICD-10-CM | POA: Insufficient documentation

## 2018-09-11 DIAGNOSIS — K219 Gastro-esophageal reflux disease without esophagitis: Secondary | ICD-10-CM | POA: Insufficient documentation

## 2018-09-11 DIAGNOSIS — Z888 Allergy status to other drugs, medicaments and biological substances status: Secondary | ICD-10-CM | POA: Insufficient documentation

## 2018-09-11 DIAGNOSIS — F329 Major depressive disorder, single episode, unspecified: Secondary | ICD-10-CM | POA: Diagnosis not present

## 2018-09-11 DIAGNOSIS — I2510 Atherosclerotic heart disease of native coronary artery without angina pectoris: Secondary | ICD-10-CM | POA: Diagnosis not present

## 2018-09-11 DIAGNOSIS — F0390 Unspecified dementia without behavioral disturbance: Secondary | ICD-10-CM | POA: Diagnosis not present

## 2018-09-11 DIAGNOSIS — Z955 Presence of coronary angioplasty implant and graft: Secondary | ICD-10-CM | POA: Diagnosis not present

## 2018-09-11 DIAGNOSIS — F419 Anxiety disorder, unspecified: Secondary | ICD-10-CM | POA: Diagnosis not present

## 2018-09-11 DIAGNOSIS — Z87891 Personal history of nicotine dependence: Secondary | ICD-10-CM | POA: Insufficient documentation

## 2018-09-11 DIAGNOSIS — N3281 Overactive bladder: Secondary | ICD-10-CM

## 2018-09-11 DIAGNOSIS — Z9841 Cataract extraction status, right eye: Secondary | ICD-10-CM | POA: Diagnosis not present

## 2018-09-11 DIAGNOSIS — I252 Old myocardial infarction: Secondary | ICD-10-CM | POA: Diagnosis not present

## 2018-09-11 HISTORY — PX: CYSTOSCOPY: SHX5120

## 2018-09-11 HISTORY — PX: BOTOX INJECTION: SHX5754

## 2018-09-11 LAB — GLUCOSE, CAPILLARY
Glucose-Capillary: 114 mg/dL — ABNORMAL HIGH (ref 70–99)
Glucose-Capillary: 113 mg/dL — ABNORMAL HIGH (ref 70–99)

## 2018-09-11 SURGERY — BOTOX INJECTION
Anesthesia: General | Site: Bladder

## 2018-09-11 MED ORDER — MIDAZOLAM HCL 2 MG/2ML IJ SOLN
INTRAMUSCULAR | Status: AC
  Filled 2018-09-11: qty 2

## 2018-09-11 MED ORDER — PROPOFOL 10 MG/ML IV BOLUS
INTRAVENOUS | Status: DC | PRN
  Administered 2018-09-11: 150 mg via INTRAVENOUS

## 2018-09-11 MED ORDER — CEFAZOLIN SODIUM-DEXTROSE 2-4 GM/100ML-% IV SOLN
INTRAVENOUS | Status: AC
  Filled 2018-09-11: qty 100

## 2018-09-11 MED ORDER — LACTATED RINGERS IV SOLN
INTRAVENOUS | Status: DC | PRN
  Administered 2018-09-11: 09:00:00 via INTRAVENOUS

## 2018-09-11 MED ORDER — FENTANYL CITRATE (PF) 100 MCG/2ML IJ SOLN
INTRAMUSCULAR | Status: DC | PRN
  Administered 2018-09-11: 25 ug via INTRAVENOUS
  Administered 2018-09-11: 50 ug via INTRAVENOUS
  Administered 2018-09-11: 25 ug via INTRAVENOUS

## 2018-09-11 MED ORDER — ONABOTULINUMTOXINA 100 UNITS IJ SOLR
100.0000 [IU] | Freq: Once | INTRAMUSCULAR | Status: AC
  Administered 2018-09-11: 10:00:00 100 [IU] via INTRAMUSCULAR
  Filled 2018-09-11 (×2): qty 100

## 2018-09-11 MED ORDER — SODIUM CHLORIDE 0.9 % IV SOLN: INTRAVENOUS | Status: DC

## 2018-09-11 MED ORDER — SODIUM CHLORIDE (PF) 0.9 % IJ SOLN
INTRAMUSCULAR | Status: DC | PRN
  Administered 2018-09-11: 11 mL

## 2018-09-11 MED ORDER — SODIUM CHLORIDE (PF) 0.9 % IJ SOLN
INTRAMUSCULAR | Status: AC
  Filled 2018-09-11: qty 20

## 2018-09-11 MED ORDER — FENTANYL CITRATE (PF) 100 MCG/2ML IJ SOLN
INTRAMUSCULAR | Status: AC
  Filled 2018-09-11: qty 2

## 2018-09-11 MED ORDER — ONDANSETRON HCL 4 MG/2ML IJ SOLN
INTRAMUSCULAR | Status: DC | PRN
  Administered 2018-09-11: 4 mg via INTRAVENOUS

## 2018-09-11 MED ORDER — LIDOCAINE HCL (CARDIAC) PF 100 MG/5ML IV SOSY
PREFILLED_SYRINGE | INTRAVENOUS | Status: DC | PRN
  Administered 2018-09-11: 100 mg via INTRAVENOUS

## 2018-09-11 SURGICAL SUPPLY — 18 items
BAG DRAIN CYSTO-URO LG1000N (MISCELLANEOUS) ×3 IMPLANT
BRUSH SCRUB EZ 1% IODOPHOR (MISCELLANEOUS) ×3 IMPLANT
CATH URETL 5X70 OPEN END (CATHETERS) ×3 IMPLANT
COVER WAND RF STERILE (DRAPES) ×3 IMPLANT
GLOVE BIO SURGEON STRL SZ 6.5 (GLOVE) ×3 IMPLANT
GOWN STRL REUS W/ TWL LRG LVL3 (GOWN DISPOSABLE) ×4 IMPLANT
GOWN STRL REUS W/TWL LRG LVL3 (GOWN DISPOSABLE) ×2
GUIDEWIRE STR DUAL SENSOR (WIRE) ×6 IMPLANT
NDL SAFETY ECLIPSE 18X1.5 (NEEDLE) ×4 IMPLANT
NEEDLE HYPO 18GX1.5 SHARP (NEEDLE) ×2
NEEDLE INJETAK FLEX 70CM BOTOX (NEEDLE) ×3 IMPLANT
PACK CYSTO AR (MISCELLANEOUS) ×3 IMPLANT
SET CYSTO W/LG BORE CLAMP LF (SET/KITS/TRAYS/PACK) ×3 IMPLANT
SOL .9 NS 3000ML IRR  AL (IV SOLUTION) ×1
SOL .9 NS 3000ML IRR UROMATIC (IV SOLUTION) ×2 IMPLANT
SURGILUBE 2OZ TUBE FLIPTOP (MISCELLANEOUS) ×3 IMPLANT
SYR 10ML LL (SYRINGE) ×6 IMPLANT
WATER STERILE IRR 1000ML POUR (IV SOLUTION) ×3 IMPLANT

## 2018-09-11 NOTE — Op Note (Signed)
Date of procedure: 09/11/18  Preoperative diagnosis:  1. Refractory OAB  Postoperative diagnosis:  1. Same as above 2.  Procedure: 1. Cystoscopy 2. Intravesical injection of botulinum toxin  Surgeon: [name], MD  Anesthesia: General  Complications: None  Intraoperative findings: Uncomplicated procedure.   EBL: Minimal  Specimens: None  Drains: None  Indication: [name] is a 73 y.o. patient with severe refractory OAB symptoms who is elected to pursue Botox.  After reviewing the management options for treatment, the patient elected to proceed with the above surgical procedure(s). We have discussed the potential benefits and risks of the procedure, side effects of the proposed treatment, the likelihood of the patient achieving the goals of the procedure, and any potential problems that might occur during the procedure or recuperation. Informed consent has been obtained.  Description of procedure:  The patient was taken to the operating room and general anesthesia was induced.  The patient was placed in the dorsal lithotomy position, prepped and draped in the usual sterile fashion, and preoperative antibiotics were administered. A preoperative time-out was performed.   A 21 French rigid cystoscope was advanced per urethra into the bladder.  The bladder was carefully inspected.  There are no papillary lesions appreciated or erythema.  The bladder was noted to be heavily trabeculated.  The trigone was normal.. At this point time, a total of 100 units of botulinum toxin was injected into the muscularis propria of the bladder in a total of 20 0.5 cc injections in rows across the posterior bladder wall.  The procedure was uncomplicated.  Hemostasis was excellent.  The bladder was drained and the scope was removed.  The patient was then clean and dry, repositioned the supine position, reversed by anesthesia, and taken to the PACU in stable condition  Plan: Patient  will follow up in a few weeks.  [name], M.D.

## 2018-09-11 NOTE — H&P (Signed)
09/11/18 8:56 AM   [name] 03-12-1945 [id number]   HPI: 73 year old male with personal history of low risk bladder cancer and refractory OAB who returns today via virtual visit to discuss his urinary symptoms.  He is remote history of low risk TA bladder cancer 2015 without recurrence.  He is on annual cystoscopy due next 10/2018.    He has a personal history of urinary urgency, frequency and urge incontinence.  Anticholinergics were contraindicated due to worsening mental status/new diagnosis of dementia.  He is unable to afford Myrbetriq.  More recently, he underwent Botox injection on 11/2017 100 units.  He had a significant decrease in his urgency and urge incontinence episodes.  He was able to empty his bladder, PVR 0 and never needed to straight cath.  He reports today that he believes the medicine is now wearing off.  He has increasing episodes of gross incontinence which is quite bothersome to him.   PMH: Past Medical History:  Diagnosis Date  . Anxiety   . Arteriosclerosis of coronary artery 11/16/2011   Overview:  Stent 10/2011 stent rca 2015 with collaterals to lad which is chronically occluded   . Benign enlargement of prostate   . Benign essential HTN 06/11/2014  . Benign prostatic hypertrophy without urinary obstruction 07/31/2014  . Bilateral cataracts 05/16/2013   Overview:  Dr. [facility]    . Bone spur of foot    Left  . BP (high blood pressure) 11/09/2012  . Cancer ([place])    skin (forehead) and bladder  . Carotid artery narrowing 02/08/2014  . Depression   . Detrusor hypertrophy   . Diabetes ([place])   . Diabetes mellitus, type 2 ([place]) 12/12/2012  . Diverticulosis   . Dyspnea   . Esophageal reflux   . Esophageal reflux   . Fothergill's neuralgia 08/07/2012   Overview:  [facility]   . Gastritis   . GERD (gastroesophageal reflux disease)   . Headache    cluster headaches  . 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 Hearing loss in left ear    . Heart disease   . Hematuria   . Hemorrhoids   . History of hiatal hernia 12/14/2017   small   . Hypercholesteremia   . Lesion of bladder   . Myocardial infarct ([place])   . Presence of stent in coronary artery 11/09/2012  . Rectal bleeding   . Trigeminal neuralgia   . Trigeminal neuralgia   . Valvular heart disease   . Vitamin D deficiency     Surgical History: Past Surgical History:  Procedure Laterality Date  . APPENDECTOMY    . BOTOX INJECTION N/A 12/21/2017   Procedure: Bladder BOTOX INJECTION;  Surgeon: [name], MD;  Location: [facility] ORS;  Service: Urology;  Laterality: N/A;  . CARDIAC CATHETERIZATION    . CARDIAC CATHETERIZATION N/A 01/22/2015   Procedure: Left Heart Cath;  Surgeon: Corey Skains, MD;  Location: [facility] CV LAB;  Service: Cardiovascular;  Laterality: N/A;  . CARDIAC CATHETERIZATION N/A 01/22/2015   Procedure: Coronary Stent Intervention;  Surgeon: Isaias Cowman, MD;  Location: [facility] LAB;  Service: Cardiovascular;  Laterality: N/A;  . CATARACT EXTRACTION, BILATERAL    . COLONOSCOPY WITH PROPOFOL N/A 12/08/2015   Procedure: COLONOSCOPY WITH PROPOFOL;  Surgeon: Lollie Sails, MD;  Location: [facility] ENDOSCOPY;  Service: Endoscopy;  Laterality: N/A;  . COLONOSCOPY WITH PROPOFOL N/A 12/09/2015   Procedure: COLONOSCOPY WITH PROPOFOL;  Surgeon: Lollie Sails, MD;  Location: [facility]  ENDOSCOPY;  Service: Endoscopy;  Laterality: N/A;  . CORONARY ANGIOPLASTY     5 stents  . CORONARY STENT PLACEMENT  2015   x5  . CYSTOSCOPY WITH BIOPSY N/A 12/21/2017   Procedure: CYSTOSCOPY WITH BIOPSY;  Surgeon: [name], MD;  Location: [facility] ORS;  Service: Urology;  Laterality: N/A;  . ESOPHAGOGASTRODUODENOSCOPY (EGD) WITH PROPOFOL N/A 12/08/2015   Procedure: ESOPHAGOGASTRODUODENOSCOPY (EGD) WITH PROPOFOL;  Surgeon: Lollie Sails, MD;  Location: [facility] ENDOSCOPY;  Service: Endoscopy;  Laterality: N/A;  . ESOPHAGOGASTRODUODENOSCOPY (EGD) WITH  PROPOFOL N/A 12/13/2017   Procedure: ESOPHAGOGASTRODUODENOSCOPY (EGD) WITH PROPOFOL;  Surgeon: Lollie Sails, MD;  Location[facility] ENDOSCOPY;  Service: Endoscopy;  Laterality: N/A;  . EYE SURGERY    . HERNIA REPAIR    . kidney tumor remove    . TRANSURETHRAL RESECTION OF BLADDER TUMOR WITH GYRUS (TURBT-GYRUS)  12/2013  . UMBILICAL HERNIA REPAIR    . urethral meatotomy      Home Medications:  Current Meds  Medication Sig  . aspirin 81 MG tablet Take 81 mg by mouth daily.  . Cholecalciferol (VITAMIN D-1000 MAX ST) 1000 UNITS tablet Take 1,000 Units by mouth daily.   . citalopram (CELEXA) 20 MG tablet Take 20 mg by mouth at bedtime.   . gabapentin (NEURONTIN) 100 MG capsule Take 100 mg by mouth 3 (three) times daily.  [name] gabapentin (NEURONTIN) 300 MG capsule Take 1,200 mg by mouth 2 (two) times daily.  . isosorbide mononitrate (IMDUR) 30 MG 24 hr tablet Take 60 mg by mouth daily.   . memantine (NAMENDA) 5 MG tablet Take 5 mg by mouth daily.   . mirabegron ER (MYRBETRIQ) 25 MG TB24 tablet Take 1 tablet (25 mg total) by mouth daily.  . Multiple Vitamins-Iron (MULTIVITAMINS WITH IRON) TABS tablet Take 1 tablet by mouth daily.  . Omega-3 1000 MG CAPS Take 1 g by mouth daily. Reported on 03/21/2015  . OXcarbazepine (TRILEPTAL) 150 MG tablet Take 150 mg by mouth at bedtime.   . pantoprazole (PROTONIX) 40 MG tablet Take 40 mg by mouth daily.  . pramipexole (MIRAPEX) 1 MG tablet Take 1 mg by mouth daily.  . pravastatin (PRAVACHOL) 80 MG tablet TAKE 1 TABLET (80 MG TOTAL) BY MOUTH NIGHTLY.     Allergies:  Allergies  Allergen Reactions  . Ace Inhibitors Cough  . Lyrica [Pregabalin] Rash    Family History: Family History  Problem Relation Age of Onset  . Kidney cancer Mother   . Prostate cancer Neg Hx     Social History:  reports that he quit smoking about 29 years ago. His smoking use included cigarettes. He has a 80.00 pack-year smoking history. He has never used smokeless tobacco.  He reports previous alcohol use. He reports that he does not use drugs.  ROS: Negative  Physical Exam: BP 130/85   Pulse 66   Temp (!) 97.3 F (36.3 C) (Temporal)   Resp 18   Ht 5\' 7"  (1.702 m)   Wt 112 kg   SpO2 98%   BMI 38.67 kg/m   Constitutional:  Alert and oriented, No acute distress. HEENT: [place] AT, moist mucus membranes.  Trachea midline, no masses. Cardiovascular: No clubbing, cyanosis, or edema. RRR. Respiratory: Normal respiratory effort, no increased work of breathing. CTAB. GI: Abdomen is soft, nontender, nondistended, no abdominal masses GU: No CVA tenderness Skin: No rashes, bruises or suspicious lesions. Neurologic: Grossly intact, no focal deficits, moving all 4 extremities. Psychiatric: Normal mood and affect.  Laboratory Data:  Lab Results  Component Value Date   WBC 7.5 01/23/2015   HGB 13.5 01/23/2015   HCT 40.2 01/23/2015   MCV 76.8 (L) 01/23/2015   PLT 164 01/23/2015    Lab Results  Component Value Date   CREATININE 0.98 01/23/2015    Lab Results  Component Value Date   HGBA1C 6.8 (H) 05/22/2012    Urinalysis    Component Value Date/Time   COLORURINE YELLOW (A) 12/16/2017 1005   APPEARANCEUR Clear 08/31/2018 1035   LABSPEC 1.014 12/16/2017 1005   LABSPEC 1.018 12/31/2013 0916   PHURINE 6.0 12/16/2017 1005   GLUCOSEU Negative 08/31/2018 1035   GLUCOSEU Negative 12/31/2013 0916   HGBUR SMALL (A) 12/16/2017 1005   BILIRUBINUR Negative 08/31/2018 1035   BILIRUBINUR Negative 12/31/2013 0916   KETONESUR NEGATIVE 12/16/2017 1005   PROTEINUR Negative 08/31/2018 1035   PROTEINUR NEGATIVE 12/16/2017 1005   NITRITE Negative 08/31/2018 1035   NITRITE NEGATIVE 12/16/2017 1005   LEUKOCYTESUR Negative 08/31/2018 1035   LEUKOCYTESUR Negative 12/31/2013 0916    Lab Results  Component Value Date   LABMICR See below: 08/31/2018   WBCUA 0-5 08/31/2018   RBCUA None seen 09/22/2016   LABEPIT 0-10 08/31/2018   MUCUS Present (A) 08/31/2018    BACTERIA None seen 08/31/2018     Assessment & Plan:    1. OAB (overactive bladder) S/p Botox 11/2017 with significant provement her urinary symptoms Medication appears to be wearing off, interested in repeat injection Discussed the option of returning to the operating room for this procedure versus trial of in office injection He is like to have the procedure in the operating room. We discussed possible side effects including UTI, blood in the urine, intolerance of in office injections amongst others All of his questions were answered  Ultimately, the patient elected to do this in the operating room today.  Risk and benefits were discussed.  All questions answered.  2. History of bladder cancer Continues annual cysto, due 10/2018   [name], MD  [facility] [address] [place], [place] [address] [phone]

## 2018-09-11 NOTE — Anesthesia Procedure Notes (Signed)
Procedure Name: LMA Insertion Date/Time: 09/11/2018 9:18 AM Performed by: Justus Memory, CRNA Pre-anesthesia Checklist: Patient identified, Patient being monitored, Timeout performed, Emergency Drugs available and Suction available Patient Re-evaluated:Patient Re-evaluated prior to induction Oxygen Delivery Method: Circle system utilized Preoxygenation: Pre-oxygenation with 100% oxygen Induction Type: IV induction Ventilation: Mask ventilation without difficulty LMA: LMA inserted LMA Size: 4.5 Tube type: Oral Number of attempts: 1 Placement Confirmation: positive ETCO2 and breath sounds checked- equal and bilateral Tube secured with: Tape Dental Injury: Teeth and Oropharynx as per pre-operative assessment

## 2018-09-11 NOTE — Anesthesia Postprocedure Evaluation (Signed)
Anesthesia Post Note  Patient: [name]  Procedure(s) Performed: Bladder BOTOX INJECTION (N/A ) CYSTOSCOPY (N/A Bladder)  Patient location during evaluation: PACU Anesthesia Type: General Level of consciousness: awake and alert and oriented Pain management: pain level controlled Vital Signs Assessment: post-procedure vital signs reviewed and stable Respiratory status: spontaneous breathing Cardiovascular status: blood pressure returned to baseline Anesthetic complications: no     Last Vitals:  Vitals:   09/11/18 1025 09/11/18 1029  BP: 94/70 138/73  Pulse: 61 66  Resp: 14 20  Temp:  36.6 C  SpO2: 91% 94%    Last Pain:  Vitals:   09/11/18 1029  TempSrc: Temporal  PainSc: 0-No pain                 Josie Mesa

## 2018-09-11 NOTE — Discharge Instructions (Signed)
Botulinum Toxin Bladder Injection  A botulinum toxin bladder injection is a procedure to treat an overactive bladder. During the procedure, a drug called botulinum toxin is injected into the bladder through a long, thin needle. This drug relaxes the bladder muscles and reduces overactivity. You may need this procedure if your medicines are not working or you cannot take them. The procedure may be repeated as needed. The treatment usually lasts for 6 months. Your health care provider will monitor you to see how well you respond. Tell a health care provider about:  Any allergies you have.  All medicines you are taking, including vitamins, herbs, eye drops, creams, and over-the-counter medicines.  Any problems you or family members have had with anesthetic medicines.  Any blood disorders you have.  Any surgeries you have had.  Any medical conditions you have.  Any previous reactions to a botulinum toxin injection.  Any symptoms of urinary tract infection. These include chills, fever, a burning feeling when passing urine, and needing to pass urine often.  Whether you are pregnant or may be pregnant. What are the risks? Generally this is a safe procedure. However, problems may occur, including:  Not being able to pass urine. If this happens, you may need to have your bladder emptied with a thin tube inserted into your urethra (urinary catheter).  Bleeding.  Urinary tract infection.  Allergic reaction to the botulinum toxin.  Pain or burning when passing urine.  Damage to other structures or organs. What happens before the procedure? Staying hydrated Follow instructions from your health care provider about hydration, which may include:  Up to 2 hours before the procedure - you may continue to drink clear liquids, such as water, clear fruit juice, black coffee, and plain tea. Eating and drinking restrictions Follow instructions from your health care provider about eating and  drinking, which may include:  8 hours before the procedure - stop eating heavy meals or foods, such as meat, fried foods, or fatty foods.  6 hours before the procedure - stop eating light meals or foods, such as toast or cereal.  6 hours before the procedure - stop drinking milk or drinks that contain milk.  2 hours before the procedure - stop drinking clear liquids. Medicines Ask your health care provider about:  Changing or stopping your regular medicines. This is especially important if you are taking diabetes medicines or blood thinners.  Taking medicines such as aspirin and ibuprofen. These medicines can thin your blood. Do not take these medicines unless your health care provider tells you to take them.  Taking over-the-counter medicines, vitamins, herbs, and supplements. General instructions  Plan to have someone take you home from the hospital or clinic.  If you will be going home right after the procedure, plan to have someone with you for 24 hours.  Ask your health care provider what steps will be taken to help prevent infection. These may include: ? Removing hair at the procedure site. ? Washing skin with a germ-killing soap. ? Antibiotic medicine. What happens during the procedure?   You will be asked to empty your bladder.  An IV will be inserted into one of your veins.  You will be given one or more of the following: ? A medicine to help you relax (sedative). ? A medicine to numb the area (local anesthetic). ? A medicine to make you fall asleep (general anesthetic).  A long, thin scope called a cystoscope will be passed into your bladder through the part  of the body that carries urine from your bladder (urethra).  The cystoscope will be used to fill your bladder with water.  A long needle will be passed through the cystoscope and into the bladder.  The botulinum toxin will be injected into your bladder. It may be injected into multiple areas of your  bladder.  Your bladder will be emptied, and the cystoscope will be removed. The procedure may vary among health care providers and hospitals. What can I expect after procedure? After your procedure, it is common to have:  Blood-tinged urine.  Burning or soreness when you pass urine. Follow these instructions at home: Medicines  Take over-the-counter and prescription medicines only as told by your health care provider.  If you were prescribed an antibiotic medicine, take it as told by your health care provider. Do not stop taking the antibiotic even if you start to feel better. General instructions   Do not drive for 24 hours if you were given a sedative during your procedure.  Drink enough fluid to keep your urine pale yellow.  Return to your normal activities as told by your health care provider. Ask your health care provider what activities are safe for you.  Keep all follow-up visits as told by your health care provider. This is important. Contact a health care provider if you have:  A fever or chills.  Blood-tinged urine for more than one day after your procedure.  Worsening pain or burning when you pass urine.  Pain or burning when passing urine for more than two days after your procedure.  Trouble emptying your bladder. Get help right away if you:  Have bright red blood in your urine.  Are unable to pass urine. Summary  A botulinum toxin bladder injection is a procedure to treat an overactive bladder.  This is generally a very safe procedure. However, problems may occur, including not being able to pass urine, bleeding, infection, pain, and allergic reactions to medicines.  You will be told when to stop eating and drinking, and what medicines to change or stop. Follow instructions carefully.  After the procedure, it is common to have blood in urine and to have soreness or burning when passing urine.  Contact a health care provider if you have a fever, have  blood in urine for more than a few days, or have trouble passing urine. Get help right away if you have bright red blood in the urine, or if you are unable to pass urine. This information is not intended to replace advice given to you by your health care provider. Make sure you discuss any questions you have with your health care provider. Document Released: 11/06/2014 Document Revised: 08/26/2017 Document Reviewed: 08/26/2017 Elsevier Patient Education  2020 [employer].

## 2018-09-11 NOTE — Anesthesia Post-op Follow-up Note (Signed)
Anesthesia QCDR form completed.        

## 2018-09-11 NOTE — Anesthesia Preprocedure Evaluation (Signed)
Anesthesia Evaluation  Patient identified by MRN, date of birth, ID band Patient awake    Reviewed: Allergy & Precautions, NPO status , Patient's Chart, lab work & pertinent test results  History of Anesthesia Complications Negative for: history of anesthetic complications  Airway Mallampati: II  TM Distance: >3 FB Neck ROM: Full    Dental  (+) Lower Dentures, Upper Dentures   Pulmonary neg sleep apnea, neg COPD, former smoker,    breath sounds clear to auscultation- rhonchi (-) wheezing      Cardiovascular hypertension, Pt. on medications + angina + CAD, + Past MI and + Cardiac Stents   Rhythm:Regular Rate:Normal - Systolic murmurs and - Diastolic murmurs    Neuro/Psych  Headaches, PSYCHIATRIC DISORDERS Anxiety Depression    GI/Hepatic Neg liver ROS, hiatal hernia, GERD  Medicated,  Endo/Other  diabetes  Renal/GU negative Renal ROS     Musculoskeletal negative musculoskeletal ROS (+)   Abdominal (+) + obese,   Peds  Hematology negative hematology ROS (+)   Anesthesia Other Findings Past Medical History: No date: Anxiety 11/16/2011: Arteriosclerosis of coronary artery     Comment:  Overview:  Stent 10/2011 stent rca 2015 with collaterals               to lad which is chronically occluded  No date: Benign enlargement of prostate 06/11/2014: Benign essential HTN 07/31/2014: Benign prostatic hypertrophy without urinary obstruction 05/16/2013: Bilateral cataracts     Comment:  Overview:  Dr. [facility]   11/09/2012: BP (high blood pressure) No date: Cancer ([place])     Comment:  skin (forehead) and bladder 02/08/2014: Carotid artery narrowing No date: Depression No date: Detrusor hypertrophy No date: Diabetes ([place]) 12/12/2012: Diabetes mellitus, type 2 (HCC) No date: Diverticulosis No date: Esophageal reflux No date: Esophageal reflux 08/07/2012: Fothergill's neuralgia     Comment:  Overview:  [facility]   No date: Gastritis No date: GERD (gastroesophageal reflux disease) No date: Headache     Comment:  cluster headaches 11/09/2012: Healed myocardial infarct No date: Hearing loss in left ear No date: Heart disease No date: Hematuria No date: Hemorrhoids 12/14/2017: History of hiatal hernia     Comment:  small  No date: Hypercholesteremia No date: Lesion of bladder No date: Myocardial infarct ([place]) 11/09/2012: Presence of stent in coronary artery No date: Rectal bleeding No date: Trigeminal neuralgia No date: Trigeminal neuralgia No date: Valvular heart disease No date: Vitamin D deficiency   Reproductive/Obstetrics                             Anesthesia Physical  Anesthesia Plan  ASA: III  Anesthesia Plan: General   Post-op Pain Management:    Induction: Intravenous  PONV Risk Score and Plan: 1 and Ondansetron and Midazolam  Airway Management Planned: LMA and Oral ETT  Additional Equipment:   Intra-op Plan:   Post-operative Plan:   Informed Consent: I have reviewed the patients History and Physical, chart, labs and discussed the procedure including the risks, benefits and alternatives for the proposed anesthesia with the patient or authorized representative who has indicated his/her understanding and acceptance.     Dental advisory given  Plan Discussed with: CRNA and Anesthesiologist  Anesthesia Plan Comments:         Anesthesia Quick Evaluation

## 2018-09-11 NOTE — Transfer of Care (Signed)
Immediate Anesthesia Transfer of Care Note  Patient: [name]  Procedure(s) Performed: Bladder BOTOX INJECTION (N/A ) CYSTOSCOPY (N/A Bladder)  Patient Location: PACU  Anesthesia Type:General  Level of Consciousness: sedated  Airway & Oxygen Therapy: Patient Spontanous Breathing and Patient connected to face mask oxygen  Post-op Assessment: Report given to RN and Post -op Vital signs reviewed and stable  Post vital signs: Reviewed and stable  Last Vitals:  Vitals Value Taken Time  BP 150/86 09/11/18 0948  Temp    Pulse 68 09/11/18 0952  Resp 13 09/11/18 0952  SpO2 92 % 09/11/18 0952  Vitals shown include unvalidated device data.  Last Pain:  Vitals:   09/11/18 0713  TempSrc: Temporal  PainSc: 0-No pain         Complications: No apparent anesthesia complications

## 2018-09-13 ENCOUNTER — Other Ambulatory Visit: Payer: Self-pay

## 2018-09-13 ENCOUNTER — Ambulatory Visit (INDEPENDENT_AMBULATORY_CARE_PROVIDER_SITE_OTHER): Payer: Medicare Other | Admitting: Podiatry

## 2018-09-13 ENCOUNTER — Encounter: Payer: Self-pay | Admitting: Podiatry

## 2018-09-13 VITALS — Temp 98.7°F

## 2018-09-13 DIAGNOSIS — M7672 Peroneal tendinitis, left leg: Secondary | ICD-10-CM | POA: Diagnosis not present

## 2018-09-13 DIAGNOSIS — S86312A Strain of muscle(s) and tendon(s) of peroneal muscle group at lower leg level, left leg, initial encounter: Secondary | ICD-10-CM

## 2018-09-13 NOTE — Progress Notes (Signed)
He presents today for follow-up of his left foot states that it is a lot worse now than it was back in May.  His hurts right in here as he points to the arcuate portion of the cuboid bone.  He is also complaining of pain around the ankle laterally.  Objective: Pulses are palpable left foot.  He is unable to plantarflex and evert his foot.  He has severe pain on palpation of the cuboid bone.  He has pain on palpation to the peroneal tendons.  Assessment: Probable tear of the peroneus longus tendon left.  Plan: MRI requisition is made for surgical consideration.

## 2018-09-14 ENCOUNTER — Telehealth: Payer: Self-pay

## 2018-09-14 DIAGNOSIS — M7672 Peroneal tendinitis, left leg: Secondary | ICD-10-CM

## 2018-09-14 NOTE — Telephone Encounter (Signed)
Per [facility] website, No precert required for MRI Patient has been notified and he will call to set up appt to his convenience.

## 2018-09-14 NOTE — Telephone Encounter (Signed)
-----   Message from Rip Harbour, [facility] sent at 09/13/2018  2:08 PM EDT ----- Regarding: MRI MRI left ankle - evaluate peroneal tendon tear left - surgical consideration

## 2018-09-18 ENCOUNTER — Ambulatory Visit: Payer: Medicare Other | Admitting: Podiatry

## 2018-09-24 ENCOUNTER — Ambulatory Visit
Admission: RE | Admit: 2018-09-24 | Discharge: 2018-09-24 | Disposition: A | Discharge status: Home / Self Care | Payer: Medicare Other | Source: Ambulatory Visit | Attending: Podiatry | Admitting: Podiatry

## 2018-09-24 ENCOUNTER — Other Ambulatory Visit: Payer: Self-pay

## 2018-09-24 DIAGNOSIS — M7672 Peroneal tendinitis, left leg: Secondary | ICD-10-CM | POA: Diagnosis not present

## 2018-09-26 ENCOUNTER — Telehealth: Payer: Self-pay | Admitting: *Deleted

## 2018-09-26 NOTE — Telephone Encounter (Signed)
-----   Message from Garrel Ridgel, [place] sent at 09/26/2018  7:15 AM EDT ----- Val send for an over read and inform the patient of the delay and that the peroneal tendons are tearing and I am getting another opinion from another radiologist before we discuss surgery.

## 2018-09-27 NOTE — Telephone Encounter (Signed)
Request for MRI disc has been faxed to [facility]

## 2018-10-03 NOTE — Telephone Encounter (Signed)
Disc mailed to [facility]

## 2018-10-04 ENCOUNTER — Ambulatory Visit (INDEPENDENT_AMBULATORY_CARE_PROVIDER_SITE_OTHER): Payer: Medicare Other | Admitting: Urology

## 2018-10-04 ENCOUNTER — Other Ambulatory Visit: Payer: Self-pay

## 2018-10-04 ENCOUNTER — Encounter: Payer: Self-pay | Admitting: Urology

## 2018-10-04 VITALS — BP 153/78 | HR 69 | Ht 67.0 in | Wt 230.0 lb

## 2018-10-04 DIAGNOSIS — N3281 Overactive bladder: Secondary | ICD-10-CM

## 2018-10-04 DIAGNOSIS — Z8551 Personal history of malignant neoplasm of bladder: Secondary | ICD-10-CM | POA: Diagnosis not present

## 2018-10-04 LAB — BLADDER SCAN AMB NON-IMAGING

## 2018-10-04 NOTE — Progress Notes (Signed)
10/04/2018 1:56 PM   [name] 27-Jan-1946 [id number]  Referring provider: Ricardo Jericho, NP [address] [place],  [place] [address]  Chief Complaint  Patient presents with  . 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  He is remote history of low risk bladder cancer, TA in 2015 without recurrence.  His most recent cystoscopy was at the time of Botox injection on 09/11/2018.  He will be due for repeat cystoscopy next year.  In addition to the above, he has severe refractory OAB with urinary frequency urgency and urge incontinence.  Anticholinergics contraindicated due to history of dementia.  He is unable to afford Myrbetriq.  He is now undergone 2 injections with Botox 1 and 11/2017 with 100 units and more recently in 08/2018.  He believes that the medication is just starting to take effect.  He has decreased frequency and urgency.  PVR today 15 cc.  No dysuria or gross hematuria.   PMH: Past Medical History:  Diagnosis Date  . Anxiety   . Arteriosclerosis of coronary artery 11/16/2011   Overview:  Stent 10/2011 stent rca 2015 with collaterals to lad which is chronically occluded   . Benign enlargement of prostate   . Benign essential HTN 06/11/2014  . Benign prostatic hypertrophy without urinary obstruction 07/31/2014  . Bilateral cataracts 05/16/2013   Overview:  Dr. [facility]    . Bone spur of foot    Left  . BP (high blood pressure) 11/09/2012  . Cancer ([place])    skin (forehead) and bladder  . Carotid artery narrowing 02/08/2014  . Depression   . Detrusor hypertrophy   . Diabetes ([place])   . Diabetes mellitus, type 2 ([place]) 12/12/2012  . Diverticulosis   . Dyspnea   . Esophageal reflux   . Esophageal reflux   . Fothergill's neuralgia 08/07/2012   Overview:  [facility]   . Gastritis   . GERD  (gastroesophageal reflux disease)   . Headache    cluster headaches  . 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 Hearing loss in left ear   . Heart disease   . Hematuria   . Hemorrhoids   . History of hiatal hernia 12/14/2017   small   . Hypercholesteremia   . Lesion of bladder   . Myocardial infarct ([place])   . Presence of stent in coronary artery 11/09/2012  . Rectal bleeding   . Trigeminal neuralgia   . Trigeminal neuralgia   . Valvular heart disease   . Vitamin D deficiency     Surgical History: Past Surgical History:  Procedure Laterality Date  . APPENDECTOMY    . BOTOX INJECTION N/A 12/21/2017   Procedure: Bladder BOTOX INJECTION;  Surgeon: [name], MD;  Location: [facility] ORS;  Service: Urology;  Laterality: N/A;  . BOTOX INJECTION N/A 09/11/2018   Procedure: Bladder BOTOX INJECTION;  Surgeon: [name], MD;  Location: [facility] ORS;  Service: Urology;  Laterality: N/A;  . CARDIAC CATHETERIZATION    . CARDIAC CATHETERIZATION N/A 01/22/2015   Procedure: Left Heart Cath;  Surgeon: Corey Skains, MD;  Location: [place] CV LAB;  Service: Cardiovascular;  Laterality: N/A;  . CARDIAC CATHETERIZATION N/A 01/22/2015   Procedure: Coronary Stent Intervention;  Surgeon: Isaias Cowman, MD;  Location: [place] [facility];  Service: Cardiovascular;  Laterality: N/A;  . CATARACT EXTRACTION,  BILATERAL    . COLONOSCOPY WITH PROPOFOL N/A 12/08/2015   Procedure: COLONOSCOPY WITH PROPOFOL;  Surgeon: Lollie Sails, MD;  Location: [facility] ENDOSCOPY;  Service: Endoscopy;  Laterality: N/A;  . COLONOSCOPY WITH PROPOFOL N/A 12/09/2015   Procedure: COLONOSCOPY WITH PROPOFOL;  Surgeon: Lollie Sails, MD;  Location: [facility] ENDOSCOPY;  Service: Endoscopy;  Laterality: N/A;  . CORONARY ANGIOPLASTY     5 stents  . CORONARY STENT PLACEMENT  2015   x5  . CYSTOSCOPY N/A 09/11/2018   Procedure: CYSTOSCOPY;  Surgeon: [name], MD;  Location: [facility] ORS;  Service: Urology;   Laterality: N/A;  . CYSTOSCOPY WITH BIOPSY N/A 12/21/2017   Procedure: CYSTOSCOPY WITH BIOPSY;  Surgeon: [name], MD;  Location: [facility] ORS;  Service: Urology;  Laterality: N/A;  . ESOPHAGOGASTRODUODENOSCOPY (EGD) WITH PROPOFOL N/A 12/08/2015   Procedure: ESOPHAGOGASTRODUODENOSCOPY (EGD) WITH PROPOFOL;  Surgeon: Lollie Sails, MD;  Location: [facility] ENDOSCOPY;  Service: Endoscopy;  Laterality: N/A;  . ESOPHAGOGASTRODUODENOSCOPY (EGD) WITH PROPOFOL N/A 12/13/2017   Procedure: ESOPHAGOGASTRODUODENOSCOPY (EGD) WITH PROPOFOL;  Surgeon: Lollie Sails, MD;  Location: [facility] ENDOSCOPY;  Service: Endoscopy;  Laterality: N/A;  . EYE SURGERY    . HERNIA REPAIR    . kidney tumor remove    . TRANSURETHRAL RESECTION OF BLADDER TUMOR WITH GYRUS (TURBT-GYRUS)  12/2013  . UMBILICAL HERNIA REPAIR    . urethral meatotomy      Home Medications:  Allergies as of 10/04/2018      Reactions   Ace Inhibitors Cough   Lyrica [pregabalin] Rash      Medication List       Accurate as of October 04, 2018  1:56 PM. If you have any questions, ask your nurse or doctor.        STOP taking these medications   memantine 5 MG tablet Commonly known as: NAMENDA Stopped by: [name], MD     TAKE these medications   aspirin 81 MG tablet Take 81 mg by mouth daily.   citalopram 20 MG tablet Commonly known as: CELEXA Take 20 mg by mouth at bedtime.   gabapentin 100 MG capsule Commonly known as: NEURONTIN Take 100 mg by mouth 3 (three) times daily.   gabapentin 300 MG capsule Commonly known as: NEURONTIN Take 1,200 mg by mouth 2 (two) times daily.   isosorbide mononitrate 30 MG 24 hr tablet Commonly known as: IMDUR Take 60 mg by mouth daily.   mirabegron ER 25 MG Tb24 tablet Commonly known as: MYRBETRIQ Take 1 tablet (25 mg total) by mouth daily.   multivitamins with iron Tabs tablet Take 1 tablet by mouth daily.   nitroGLYCERIN 0.4 MG SL tablet Commonly known as: NITROSTAT Place under  the tongue.   Omega-3 1000 MG Caps Take 1 g by mouth daily. Reported on 03/21/2015   ONE TOUCH ULTRA TEST test strip Generic drug: glucose blood USE 3 (THREE) TIMES DAILY USE AS INSTRUCTED.- ONE TOUCH ULTRA BLOOD GLUCOSE METER STRIPS   pantoprazole 40 MG tablet Commonly known as: PROTONIX Take 40 mg by mouth daily.   pramipexole 1 MG tablet Commonly known as: MIRAPEX Take 1 mg by mouth daily.   pravastatin 80 MG tablet Commonly known as: PRAVACHOL TAKE 1 TABLET (80 MG TOTAL) BY MOUTH NIGHTLY.   RA Blood Glucose Monitor Devi by Does not apply route.   sildenafil 50 MG tablet Commonly known as: VIAGRA Take 50 mg by mouth daily as needed for erectile dysfunction.   Trileptal 150 MG tablet Generic  drug: OXcarbazepine Take 150 mg by mouth at bedtime.   valsartan 40 MG tablet Commonly known as: DIOVAN Take 40 mg by mouth daily.   Vitamin D-1000 Max St 25 MCG (1000 UT) tablet Generic drug: Cholecalciferol Take 1,000 Units by mouth daily.       Allergies:  Allergies  Allergen Reactions  . Ace Inhibitors Cough  . Lyrica [Pregabalin] Rash    Family History: Family History  Problem Relation Age of Onset  . Kidney cancer Mother   . Prostate cancer Neg Hx     Social History:  reports that he quit smoking about 29 years ago. His smoking use included cigarettes. He has a 80.00 pack-year smoking history. He has never used smokeless tobacco. He reports previous alcohol use. He reports that he does not use drugs.  ROS: UROLOGY Frequent Urination?: Yes Hard to postpone urination?: No Burning/pain with urination?: No Get up at night to urinate?: Yes Leakage of urine?: Yes Urine stream starts and stops?: Yes Trouble starting stream?: No Do you have to strain to urinate?: No Blood in urine?: No Urinary tract infection?: No Sexually transmitted disease?: No Injury to kidneys or bladder?: No Painful intercourse?: No Weak stream?: No Erection problems?: No Penile  pain?: No  Gastrointestinal Nausea?: No Vomiting?: No Indigestion/heartburn?: No Diarrhea?: No Constipation?: No  Constitutional Fever: No Night sweats?: No Weight loss?: No Fatigue?: No  Skin Skin rash/lesions?: No Itching?: No  Eyes Blurred vision?: No Double vision?: No  Ears/Nose/Throat Sore throat?: No Sinus problems?: No  Hematologic/Lymphatic Swollen glands?: No Easy bruising?: No  Cardiovascular Leg swelling?: No Chest pain?: No  Respiratory Cough?: No Shortness of breath?: No  Endocrine Excessive thirst?: No  Musculoskeletal Back pain?: No Joint pain?: No  Neurological Headaches?: No Dizziness?: No  Psychologic Depression?: No Anxiety?: Yes  Physical Exam: BP (!) 153/78   Pulse 69   Ht 5\' 7"  (1.702 m)   Wt 230 lb (104.3 kg)   BMI 36.02 kg/m   Constitutional:  Alert and oriented, No acute distress. HEENT: [place] AT, moist mucus membranes.  Trachea midline, no masses. Cardiovascular: No clubbing, cyanosis, or edema. Respiratory: Normal respiratory effort, no increased work of breathing. GI: Abdomen is soft, obese Skin: No rashes, bruises or suspicious lesions. Neurologic: Grossly intact, no focal deficits, moving all 4 extremities. Psychiatric: Normal mood and affect.  Laboratory Data: Lab Results  Component Value Date   WBC 7.5 01/23/2015   HGB 13.5 01/23/2015   HCT 40.2 01/23/2015   MCV 76.8 (L) 01/23/2015   PLT 164 01/23/2015    Lab Results  Component Value Date   CREATININE 0.98 01/23/2015    Lab Results  Component Value Date   HGBA1C 6.8 (H) 05/22/2012     Assessment & Plan:    1. OAB (overactive bladder) Refractory OAB on Botox status post recent injection Adequate PVR today, emptying well Medication started to take effect We will follow-up in about 8 months given that this is approximately duration of his previous effectiveness, will follow-up sooner as needed We discussed the option of performing this in the  office next time which he may consider, procedure discussed - Bladder Scan (Post Void Residual) in office  2. History of bladder cancer Cystoscopy due 08/2019   Return in about 8 months (around 06/04/2019) for resassess need for botox.  [name], MD  [facility] [address], [place], [place] [address] [phone]

## 2018-10-18 ENCOUNTER — Encounter: Payer: Self-pay | Admitting: Podiatry

## 2018-11-01 ENCOUNTER — Ambulatory Visit: Payer: Medicare Other | Admitting: Podiatry

## 2018-11-14 ENCOUNTER — Other Ambulatory Visit: Payer: Medicare HMO | Admitting: Urology

## 2018-12-12 DIAGNOSIS — I5021 Acute systolic (congestive) heart failure: Secondary | ICD-10-CM | POA: Insufficient documentation

## 2018-12-13 ENCOUNTER — Ambulatory Visit (INDEPENDENT_AMBULATORY_CARE_PROVIDER_SITE_OTHER): Payer: Medicare Other | Admitting: Urology

## 2018-12-13 ENCOUNTER — Other Ambulatory Visit: Payer: Self-pay

## 2018-12-13 ENCOUNTER — Encounter: Payer: Self-pay | Admitting: Urology

## 2018-12-13 VITALS — BP 151/77 | HR 73 | Ht 67.0 in | Wt 231.0 lb

## 2018-12-13 DIAGNOSIS — Z8551 Personal history of malignant neoplasm of bladder: Secondary | ICD-10-CM | POA: Diagnosis not present

## 2018-12-13 DIAGNOSIS — N3281 Overactive bladder: Secondary | ICD-10-CM

## 2018-12-13 LAB — BLADDER SCAN AMB NON-IMAGING

## 2018-12-13 NOTE — Progress Notes (Signed)
12/13/2018 11:48 AM   [name] June 22, 1945 [id number]  Referring provider: Ricardo Jericho, NP [address] [place],  [place] [address]  Chief Complaint  Patient presents with  . Over Active Bladder    HPI: [name] is a 73 year old male with refractory OAB and a history of bladder cancer who presents today for the concern that his Botox is not working.  He went underwent his first Botox injection in 11/2017 and experienced great results.  His second injection was in 08/2018 and he states it never gave him control over his symptoms.  He is experiencing urgency, incontinence and intermittency.  He is concerned that he experienced results almost immediately after his first Botox injection.  Patient denies any gross hematuria, dysuria or suprapubic/flank pain.  Patient denies any fevers, chills, nausea or vomiting.  His UA is benign.  His PVR is 13 mL.    PMH: Past Medical History:  Diagnosis Date  . Anxiety   . Arteriosclerosis of coronary artery 11/16/2011   Overview:  Stent 10/2011 stent rca 2015 with collaterals to lad which is chronically occluded   . Benign enlargement of prostate   . Benign essential HTN 06/11/2014  . Benign prostatic hypertrophy without urinary obstruction 07/31/2014  . Bilateral cataracts 05/16/2013   Overview:  [name]    . Bone spur of foot    Left  . BP (high blood pressure) 11/09/2012  . Cancer ([name])    skin (forehead) and bladder  . Carotid artery narrowing 02/08/2014  . Depression   . Detrusor hypertrophy   . Diabetes ([name])   . Diabetes mellitus, type 2 ([place]) 12/12/2012  . Diverticulosis   . Dyspnea   . Esophageal reflux   . Esophageal reflux   . Fothergill's neuralgia 08/07/2012   Overview:  [name]   . Gastritis   . GERD (gastroesophageal reflux disease)   . Headache    cluster headaches  . 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 Hearing loss in left ear   . Heart disease   . Hematuria   . Hemorrhoids    . History of hiatal hernia 12/14/2017   small   . Hypercholesteremia   . Lesion of bladder   . Myocardial infarct ([place])   . Presence of stent in coronary artery 11/09/2012  . Rectal bleeding   . Trigeminal neuralgia   . Trigeminal neuralgia   . Valvular heart disease   . Vitamin D deficiency     Surgical History: Past Surgical History:  Procedure Laterality Date  . APPENDECTOMY    . BOTOX INJECTION N/A 12/21/2017   Procedure: Bladder BOTOX INJECTION;  Surgeon: [name] Espy, MD;  Location: [facility];  Service: Urology;  Laterality: N/A;  . BOTOX INJECTION N/A 09/11/2018   Procedure: Bladder BOTOX INJECTION;  Surgeon: [name] Espy, MD;  Location: [facility];  Service: Urology;  Laterality: N/A;  . CARDIAC CATHETERIZATION    . CARDIAC CATHETERIZATION N/A 01/22/2015   Procedure: Left Heart Cath;  Surgeon: [name] Skains, MD;  Location: [facility];  Service: Cardiovascular;  Laterality: N/A;  . CARDIAC CATHETERIZATION N/A 01/22/2015   Procedure: Coronary Stent Intervention;  Surgeon: [name] Cowman, MD;  Location: [facility];  Service: Cardiovascular;  Laterality: N/A;  . CATARACT EXTRACTION, BILATERAL    . COLONOSCOPY WITH PROPOFOL N/A 12/08/2015   Procedure: COLONOSCOPY WITH PROPOFOL;  Surgeon: [name] Sails, MD;  Location: [facility];  Service: Endoscopy;  Laterality:  N/A;  . COLONOSCOPY WITH PROPOFOL N/A 12/09/2015   Procedure: COLONOSCOPY WITH PROPOFOL;  Surgeon: [name] Sails, MD;  Location: [facility] ENDOSCOPY;  Service: Endoscopy;  Laterality: N/A;  . CORONARY ANGIOPLASTY     5 stents  . CORONARY STENT PLACEMENT  2015   x5  . CYSTOSCOPY N/A 09/11/2018   Procedure: CYSTOSCOPY;  Surgeon: [name] Espy, MD;  Location: [facility];  Service: Urology;  Laterality: N/A;  . CYSTOSCOPY WITH BIOPSY N/A 12/21/2017   Procedure: CYSTOSCOPY WITH BIOPSY;  Surgeon: [name] Espy, MD;  Location: [facility];  Service: Urology;  Laterality: N/A;  .  ESOPHAGOGASTRODUODENOSCOPY (EGD) WITH PROPOFOL N/A 12/08/2015   Procedure: ESOPHAGOGASTRODUODENOSCOPY (EGD) WITH PROPOFOL;  Surgeon: [name] Sails, MD;  Location: [facility] ENDOSCOPY;  Service: Endoscopy;  Laterality: N/A;  . ESOPHAGOGASTRODUODENOSCOPY (EGD) WITH PROPOFOL N/A 12/13/2017   Procedure: ESOPHAGOGASTRODUODENOSCOPY (EGD) WITH PROPOFOL;  Surgeon: [name] Sails, MD;  Location: [facility] ENDOSCOPY;  Service: Endoscopy;  Laterality: N/A;  . EYE SURGERY    . HERNIA REPAIR    . kidney tumor remove    . TRANSURETHRAL RESECTION OF BLADDER TUMOR WITH GYRUS (TURBT-GYRUS)  12/2013  . UMBILICAL HERNIA REPAIR    . urethral meatotomy      Home Medications:  Allergies as of 12/13/2018      Reactions   Ace Inhibitors Cough   Carbamazepine Rash   Other reaction(s): RASH Other reaction(s): RASH   Lyrica [pregabalin] Rash      Medication List       Accurate as of December 13, 2018 11:48 AM. If you have any questions, ask your nurse or doctor.        aspirin 81 MG tablet Take 81 mg by mouth daily.   citalopram 20 MG tablet Commonly known as: CELEXA Take 20 mg by mouth at bedtime.   Docusate Sodium 100 MG capsule Take by mouth.   furosemide 20 MG tablet Commonly known as: LASIX Take by mouth.   gabapentin 100 MG capsule Commonly known as: NEURONTIN Take 100 mg by mouth 3 (three) times daily.   gabapentin 300 MG capsule Commonly known as: NEURONTIN Take 1,200 mg by mouth 2 (two) times daily.   hydrocortisone 2.5 % ointment APPLY TOPICALLY 2 TIMES A DAY. FOR SCALING OF EARS AND FACE AS NEEDED. AVOID USE ON ROSACEA   isosorbide mononitrate 30 MG 24 hr tablet Commonly known as: IMDUR Take 60 mg by mouth daily.   lactulose 10 GM/15ML solution Commonly known as: CHRONULAC Take by mouth.   mirabegron ER 25 MG Tb24 tablet Commonly known as: MYRBETRIQ Take 1 tablet (25 mg total) by mouth daily.   multivitamins with iron Tabs tablet Take 1 tablet by mouth daily.    nitroGLYCERIN 0.4 MG SL tablet Commonly known as: NITROSTAT Place under the tongue.   Omega-3 1000 MG Caps Take 1 g by mouth daily. Reported on 03/21/2015   ONE TOUCH ULTRA TEST test strip Generic drug: glucose blood USE 3 (THREE) TIMES DAILY USE AS INSTRUCTED.- ONE TOUCH ULTRA BLOOD GLUCOSE METER STRIPS   pantoprazole 40 MG tablet Commonly known as: PROTONIX Take 40 mg by mouth daily.   pramipexole 1 MG tablet Commonly known as: MIRAPEX Take 1 mg by mouth daily.   pravastatin 80 MG tablet Commonly known as: PRAVACHOL TAKE 1 TABLET (80 MG TOTAL) BY MOUTH NIGHTLY.   RA Blood Glucose Monitor Devi by Does not apply route.   sildenafil 50 MG tablet Commonly known as: VIAGRA Take 50 mg by mouth daily  as needed for erectile dysfunction.   Trileptal 150 MG tablet Generic drug: OXcarbazepine Take 150 mg by mouth at bedtime.   valsartan 40 MG tablet Commonly known as: DIOVAN Take 40 mg by mouth daily.   Vitamin D-1000 Max St 25 MCG (1000 UT) tablet Generic drug: Cholecalciferol Take 1,000 Units by mouth daily.       Allergies:  Allergies  Allergen Reactions  . Ace Inhibitors Cough  . Carbamazepine Rash    Other reaction(s): RASH Other reaction(s): RASH   . Lyrica [Pregabalin] Rash    Family History: Family History  Problem Relation Age of Onset  . Kidney cancer Mother   . Prostate cancer Neg Hx     Social History:  reports that he quit smoking about 29 years ago. His smoking use included cigarettes. He has a 80.00 pack-year smoking history. He has never used smokeless tobacco. He reports previous alcohol use. He reports that he does not use drugs.  ROS: UROLOGY Frequent Urination?: No Hard to postpone urination?: Yes Burning/pain with urination?: No Get up at night to urinate?: No Leakage of urine?: Yes Urine stream starts and stops?: Yes Trouble starting stream?: No Do you have to strain to urinate?: No Blood in urine?: No Urinary tract infection?:  No Sexually transmitted disease?: No Injury to kidneys or bladder?: No Painful intercourse?: No Weak stream?: No Erection problems?: No Penile pain?: No  Gastrointestinal Nausea?: No Vomiting?: No Indigestion/heartburn?: No Diarrhea?: No Constipation?: No  Constitutional Fever: No Night sweats?: No Weight loss?: No Fatigue?: No  Skin Skin rash/lesions?: No Itching?: No  Eyes Blurred vision?: Yes Double vision?: No  Ears/Nose/Throat Sore throat?: No Sinus problems?: No  Hematologic/Lymphatic Swollen glands?: No Easy bruising?: No  Cardiovascular Leg swelling?: Yes Chest pain?: No  Respiratory Cough?: No Shortness of breath?: Yes  Endocrine Excessive thirst?: No  Musculoskeletal Back pain?: No Joint pain?: No  Neurological Headaches?: No Dizziness?: Yes  Psychologic Depression?: No Anxiety?: No  Physical Exam: BP (!) 151/77   Pulse 73   Ht 5\' 7"  (1.702 m)   Wt 231 lb (104.8 kg)   BMI 36.18 kg/m   Constitutional:  Well nourished. Alert and oriented, No acute distress. HEENT: [place] AT, moist mucus membranes.  Trachea midline, no masses. Cardiovascular: No clubbing, cyanosis, or edema. Respiratory: Normal respiratory effort, no increased work of breathing. Neurologic: Grossly intact, no focal deficits, moving all 4 extremities. Psychiatric: Normal mood and affect.  Laboratory Data: Lab Results  Component Value Date   WBC 7.5 01/23/2015   HGB 13.5 01/23/2015   HCT 40.2 01/23/2015   MCV 76.8 (L) 01/23/2015   PLT 164 01/23/2015    Lab Results  Component Value Date   CREATININE 0.98 01/23/2015    No results found for: PSA  No results found for: TESTOSTERONE  Lab Results  Component Value Date   HGBA1C 6.8 (H) 05/22/2012    No results found for: TSH  No results found for: CHOL, HDL, CHOLHDL, VLDL, LDLCALC  Lab Results  Component Value Date   AST 21 05/23/2012   Lab Results  Component Value Date   ALT 20 05/23/2012   No  components found for: ALKALINEPHOPHATASE No components found for: BILIRUBINTOTAL  No results found for: ESTRADIOL  Urinalysis Component     Latest Ref Rng & Units 12/13/2018  Specific Gravity, UA     1.005 - 1.030 1.020  pH, UA     5.0 - 7.5 5.0  Color, UA     Yellow  Yellow  Appearance Ur     Clear Clear  Leukocytes,UA     Negative Negative  Protein,UA     Negative/Trace Negative  Glucose, UA     Negative Negative  Ketones, UA     Negative Negative  RBC, UA     Negative Negative  Bilirubin, UA     Negative Negative  Urobilinogen, Ur     0.2 - 1.0 mg/dL 0.2  Nitrite, UA     Negative Negative  Microscopic Examination      See below:    I have reviewed the labs.   Pertinent Imaging: Results for FABION, JEMMOTT (MRN [id number]) as of 12/18/2018 08:36  Ref. Range 12/13/2018 11:18  Scan Result Unknown 51mL     Assessment & Plan:    1. OAB (overactive bladder) - BLADDER SCAN AMB NON-IMAGING - Urinalysis, Complete - CULTURE, URINE COMPREHENSIVE to rule out indolent infection - Insurance will not likely cover another injection of Botox this soon after his second injection - I discussed PTNS therapy at this time as he is not a candidate for anticholinergics due to his memory results and the Myrbetriq was cost prohibitive  2. History of bladder cancer He is due for another surveillance cystoscopy in 08/2019   Return for schedule PTNS - check with insurance .  These notes generated with voice recognition software. I apologize for typographical errors.  Zara Council, PA-C  [facility] [address]  [place], [place] [address] [phone]

## 2018-12-14 LAB — URINALYSIS, COMPLETE
Bilirubin, UA: NEGATIVE
Glucose, UA: NEGATIVE
Ketones, UA: NEGATIVE
Leukocytes,UA: NEGATIVE
Nitrite, UA: NEGATIVE
Protein,UA: NEGATIVE
RBC, UA: NEGATIVE
Specific Gravity, UA: 1.02 (ref 1.005–1.030)
Urobilinogen, Ur: 0.2 mg/dL (ref 0.2–1.0)
pH, UA: 5 (ref 5.0–7.5)

## 2018-12-14 LAB — MICROSCOPIC EXAMINATION
Bacteria, UA: NONE SEEN
RBC, Urine: NONE SEEN /hpf (ref 0–2)

## 2018-12-16 LAB — CULTURE, URINE COMPREHENSIVE

## 2018-12-18 ENCOUNTER — Telehealth: Payer: Self-pay

## 2018-12-18 NOTE — Telephone Encounter (Signed)
Called [facility] to have sensitivities added to culture.  They will send a fax if add on is not available.

## 2018-12-18 NOTE — Telephone Encounter (Signed)
Spoke with patient and advised of results and recommendations. °

## 2018-12-18 NOTE — Telephone Encounter (Signed)
-----   Message from Nori Riis, PA-C sent at 12/18/2018  8:14 AM EDT ----- Would you please call [facility] and ask if they would add sensitivities for this organism?

## 2018-12-18 NOTE — Telephone Encounter (Signed)
-----   Message from Nori Riis, PA-C sent at 12/18/2018  8:53 AM EDT ----- Please let [name] know that I have spoken with Dr. Erlene Quan and it is likely his insurance would not cover another Botox so close to the one in July.  His urine culture has grown out a bacteria and we are waiting on the sensitivities.  Once the sensitivities are available, we will try an antibiotic to see if his incontinence improves.

## 2018-12-21 ENCOUNTER — Telehealth: Payer: Self-pay

## 2018-12-21 LAB — SUSCEPTIBILITY, AER + ANAEROB

## 2018-12-21 NOTE — Telephone Encounter (Signed)
Advised patient urine culture is preliminary.  Will contact him when it fully results.

## 2018-12-21 NOTE — Telephone Encounter (Signed)
-----   Message from Nori Riis, PA-C sent at 12/21/2018  2:03 PM EDT ----- Please let Mr. Roy know that his urine culture is still in preliminary status and hopefully we should have a result soon.

## 2018-12-22 NOTE — Telephone Encounter (Signed)
Lattie Haw with [facility] called to request additional testing be done to the parasite finding.  Verbal given to proceed.

## 2018-12-28 ENCOUNTER — Telehealth: Payer: Self-pay

## 2018-12-28 NOTE — Telephone Encounter (Signed)
Called [facility] to follow up on sensitives result but unable to reach.  They will call back.

## 2019-01-02 ENCOUNTER — Telehealth: Payer: Self-pay

## 2019-01-02 NOTE — Telephone Encounter (Signed)
Spoke with [facility] and they say the susceptibility was initiated 10/21 and it can take 7-21 days to result.  Informed patient and he is willing to give another urine sample if needed.

## 2019-01-10 ENCOUNTER — Telehealth: Payer: Self-pay | Admitting: Urology

## 2019-01-10 ENCOUNTER — Telehealth: Payer: Self-pay

## 2019-01-10 LAB — SUSCEPTIBILITY, GRAM POS RODS

## 2019-01-10 LAB — ORGANISM ID BY SEQUENCING

## 2019-01-10 LAB — AEROBIC BACTERIA, ID BY SEQ.

## 2019-01-10 LAB — SPECIMEN STATUS REPORT

## 2019-01-10 MED ORDER — PENICILLIN V POTASSIUM 500 MG PO TABS: 500.0000 mg | ORAL_TABLET | Freq: Three times a day (TID) | ORAL | 0 refills | Status: DC

## 2019-01-10 NOTE — Telephone Encounter (Signed)
Patient aware of results and penicillin rx sent to pharmacy.  Follow up scheduled.

## 2019-01-10 NOTE — Telephone Encounter (Signed)
-----   Message from Nori Riis, PA-C sent at 01/10/2019  8:13 AM EST ----- Please let Mr. Askin know that his sensitivities are finally available.  I would like him to start penicillin 500 mg, 3 times daily for 7 days.  Then we need to see him in follow-up a week or 2 after he completes his antibiotics to see if his urinary symptoms improved.

## 2019-01-10 NOTE — Telephone Encounter (Signed)
Pt. Would like a call to discuss urine organisms.

## 2019-01-23 DIAGNOSIS — G4733 Obstructive sleep apnea (adult) (pediatric): Secondary | ICD-10-CM | POA: Insufficient documentation

## 2019-01-30 NOTE — Progress Notes (Signed)
01/31/2019 11:34 AM   [name] [birth year] [id number]  Referring provider: Ricardo Jericho, NP [address] [place],  [place] [address]  Chief Complaint  Patient presents with  . Over Active Bladder    HPI: [name] is a 73 year old male with refractory OAB and a history of bladder cancer who presents today for the concern that his Botox is not working and was placed on an antibiotic due to a positive urine culture to see if that would improve his symptoms.   He went underwent his first Botox injection in 11/2017 and experienced great results.  His second injection was in 08/2018 and he states it never gave him control over his symptoms.  He is experiencing urgency, incontinence and intermittency.  He is concerned that he experienced results almost immediately after his first Botox injection.   His urine culture grew out corynebacterium glucuronolyticum.  He was placed on PCN.    He states the antibiotic did not make a difference whatsoever.  He is still experiencing the urgency, incontinence and intermittency.  Patient denies any gross hematuria, dysuria or suprapubic/flank pain.  Patient denies any fevers, chills, nausea or vomiting.   PMH: Past Medical History:  Diagnosis Date  . Anxiety   . Arteriosclerosis of coronary artery 11/16/2011   Overview:  Stent 10/2011 stent rca 2015 with collaterals to lad which is chronically occluded   . Benign enlargement of prostate   . Benign essential HTN 06/11/2014  . Benign prostatic hypertrophy without urinary obstruction 07/31/2014  . Bilateral cataracts 05/16/2013   Overview:  Dr. [facility]    . Bone spur of foot    Left  . BP (high blood pressure) 11/09/2012  . Cancer ([place])    skin (forehead) and bladder  . Carotid artery narrowing 02/08/2014  . Depression   . Detrusor hypertrophy   . Diabetes ([place])   . Diabetes mellitus, type 2 ([place]) 12/12/2012  . Diverticulosis   . Dyspnea   . Esophageal reflux   .  Esophageal reflux   . Fothergill's neuralgia 08/07/2012   Overview:  [facility]   . Gastritis   . GERD (gastroesophageal reflux disease)   . Headache    cluster headaches  . 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 Hearing loss in left ear   . Heart disease   . Hematuria   . Hemorrhoids   . History of hiatal hernia 12/14/2017   small   . Hypercholesteremia   . Lesion of bladder   . Myocardial infarct ([place])   . Presence of stent in coronary artery 11/09/2012  . Rectal bleeding   . Trigeminal neuralgia   . Trigeminal neuralgia   . Valvular heart disease   . Vitamin D deficiency     Surgical History: Past Surgical History:  Procedure Laterality Date  . APPENDECTOMY    . BOTOX INJECTION N/A 12/21/2017   Procedure: Bladder BOTOX INJECTION;  Surgeon: [name], MD;  Location: [facility] ORS;  Service: Urology;  Laterality: N/A;  . BOTOX INJECTION N/A 09/11/2018   Procedure: Bladder BOTOX INJECTION;  Surgeon: [name], MD;  Location: [facility] ORS;  Service: Urology;  Laterality: N/A;  . CARDIAC CATHETERIZATION    . CARDIAC CATHETERIZATION N/A 01/22/2015   Procedure: Left Heart Cath;  Surgeon: Corey Skains, MD;  Location: [facility] CV [facility];  Service: Cardiovascular;  Laterality: N/A;  . CARDIAC CATHETERIZATION N/A 01/22/2015   Procedure: Coronary Stent Intervention;  Surgeon: Isaias Cowman, MD;  Location: [facility];  Service: Cardiovascular;  Laterality: N/A;  . CATARACT EXTRACTION, BILATERAL    . COLONOSCOPY WITH PROPOFOL N/A 12/08/2015   Procedure: COLONOSCOPY WITH PROPOFOL;  Surgeon: Lollie Sails, MD;  Location: [facility] ENDOSCOPY;  Service: Endoscopy;  Laterality: N/A;  . COLONOSCOPY WITH PROPOFOL N/A 12/09/2015   Procedure: COLONOSCOPY WITH PROPOFOL;  Surgeon: Lollie Sails, MD;  Location: [facility] ENDOSCOPY;  Service: Endoscopy;  Laterality: N/A;  . CORONARY ANGIOPLASTY     5 stents  . CORONARY STENT PLACEMENT  2015   x5  . CYSTOSCOPY N/A 09/11/2018    Procedure: CYSTOSCOPY;  Surgeon: [name], MD;  Location: [facility] ORS;  Service: Urology;  Laterality: N/A;  . CYSTOSCOPY WITH BIOPSY N/A 12/21/2017   Procedure: CYSTOSCOPY WITH BIOPSY;  Surgeon: [name], MD;  Location: [facility] ORS;  Service: Urology;  Laterality: N/A;  . ESOPHAGOGASTRODUODENOSCOPY (EGD) WITH PROPOFOL N/A 12/08/2015   Procedure: ESOPHAGOGASTRODUODENOSCOPY (EGD) WITH PROPOFOL;  Surgeon: Lollie Sails, MD;  Location: [facility] ENDOSCOPY;  Service: Endoscopy;  Laterality: N/A;  . ESOPHAGOGASTRODUODENOSCOPY (EGD) WITH PROPOFOL N/A 12/13/2017   Procedure: ESOPHAGOGASTRODUODENOSCOPY (EGD) WITH PROPOFOL;  Surgeon: Lollie Sails, MD;  Location: [facility] ENDOSCOPY;  Service: Endoscopy;  Laterality: N/A;  . EYE SURGERY    . HERNIA REPAIR    . kidney tumor remove    . TRANSURETHRAL RESECTION OF BLADDER TUMOR WITH GYRUS (TURBT-GYRUS)  12/2013  . UMBILICAL HERNIA REPAIR    . urethral meatotomy      Home Medications:  Allergies as of 01/31/2019      Reactions   Ace Inhibitors Cough   Carbamazepine Rash   Other reaction(s): RASH Other reaction(s): RASH   Lyrica [pregabalin] Rash      Medication List       Accurate as of January 31, 2019 11:34 AM. If you have any questions, ask your nurse or doctor.        aspirin 81 MG tablet Take 81 mg by mouth daily.   citalopram 20 MG tablet Commonly known as: CELEXA Take 20 mg by mouth at bedtime.   Docusate Sodium 100 MG capsule Take by mouth.   furosemide 20 MG tablet Commonly known as: LASIX Take by mouth.   gabapentin 100 MG capsule Commonly known as: NEURONTIN Take 100 mg by mouth 3 (three) times daily.   gabapentin 300 MG capsule Commonly known as: NEURONTIN Take 1,200 mg by mouth 2 (two) times daily.   hydrocortisone 2.5 % ointment APPLY TOPICALLY 2 TIMES A DAY. FOR SCALING OF EARS AND FACE AS NEEDED. AVOID USE ON ROSACEA   isosorbide mononitrate 30 MG 24 hr tablet Commonly known as: IMDUR Take 60 mg  by mouth daily.   lactulose 10 GM/15ML solution Commonly known as: CHRONULAC Take by mouth.   mirabegron ER 25 MG Tb24 tablet Commonly known as: MYRBETRIQ Take 1 tablet (25 mg total) by mouth daily.   multivitamins with iron Tabs tablet Take 1 tablet by mouth daily.   nitroGLYCERIN 0.4 MG SL tablet Commonly known as: NITROSTAT Place under the tongue.   Omega-3 1000 MG Caps Take 1 g by mouth daily. Reported on 03/21/2015   ONE TOUCH ULTRA TEST test strip Generic drug: glucose blood USE 3 (THREE) TIMES DAILY USE AS INSTRUCTED.- ONE TOUCH ULTRA BLOOD GLUCOSE METER STRIPS   pantoprazole 40 MG tablet Commonly known as: PROTONIX Take 40 mg by mouth daily.   penicillin v potassium 500 MG tablet Commonly known as: VEETID Take 1 tablet (500 mg  total) by mouth 3 (three) times daily.   pramipexole 1 MG tablet Commonly known as: MIRAPEX Take 1 mg by mouth daily.   pravastatin 80 MG tablet Commonly known as: PRAVACHOL TAKE 1 TABLET (80 MG TOTAL) BY MOUTH NIGHTLY.   RA Blood Glucose Monitor Devi by Does not apply route.   sildenafil 50 MG tablet Commonly known as: VIAGRA Take 50 mg by mouth daily as needed for erectile dysfunction.   terbinafine 250 MG tablet Commonly known as: LAMISIL Take by mouth.   Trileptal 150 MG tablet Generic drug: OXcarbazepine Take 150 mg by mouth at bedtime.   valsartan 40 MG tablet Commonly known as: DIOVAN Take 40 mg by mouth daily.   Vitamin D-1000 Max St 25 MCG (1000 UT) tablet Generic drug: Cholecalciferol Take 1,000 Units by mouth daily.       Allergies:  Allergies  Allergen Reactions  . Ace Inhibitors Cough  . Carbamazepine Rash    Other reaction(s): RASH Other reaction(s): RASH   . Lyrica [Pregabalin] Rash    Family History: Family History  Problem Relation Age of Onset  . Kidney cancer Mother   . Prostate cancer Neg Hx     Social History:  reports that he quit smoking about 29 years ago. His smoking use included  cigarettes. He has a 80.00 pack-year smoking history. He has never used smokeless tobacco. He reports previous alcohol use. He reports that he does not use drugs.  ROS: UROLOGY Frequent Urination?: Yes Hard to postpone urination?: No Burning/pain with urination?: No Get up at night to urinate?: No Leakage of urine?: Yes Urine stream starts and stops?: No Trouble starting stream?: No Do you have to strain to urinate?: No Blood in urine?: No Urinary tract infection?: No Sexually transmitted disease?: No Injury to kidneys or bladder?: No Painful intercourse?: No Weak stream?: No Erection problems?: No Penile pain?: No  Gastrointestinal Nausea?: No Vomiting?: No Indigestion/heartburn?: No Diarrhea?: No Constipation?: Yes  Constitutional Fever: No Night sweats?: No Weight loss?: No Fatigue?: Yes  Skin Skin rash/lesions?: No Itching?: No  Eyes Blurred vision?: No Double vision?: No  Ears/Nose/Throat Sore throat?: No Sinus problems?: No  Hematologic/Lymphatic Swollen glands?: No Easy bruising?: No  Cardiovascular Leg swelling?: Yes Chest pain?: No  Respiratory Cough?: No Shortness of breath?: Yes  Endocrine Excessive thirst?: No  Musculoskeletal Back pain?: No Joint pain?: No  Neurological Headaches?: No Dizziness?: No  Psychologic Depression?: No Anxiety?: No  Physical Exam: BP 114/69   Pulse 76   Ht 5\' 7"  (1.702 m)   Wt 223 lb (101.2 kg)   BMI 34.93 kg/m   Constitutional:  Well nourished. Alert and oriented, No acute distress. HEENT: [place] AT, moist mucus membranes.  Trachea midline, no masses. Cardiovascular: No clubbing, cyanosis, or edema. Respiratory: Normal respiratory effort, no increased work of breathing. Neurologic: Grossly intact, no focal deficits, moving all 4 extremities. Psychiatric: Normal mood and affect.  Laboratory Data: Lab Results  Component Value Date   WBC 7.5 01/23/2015   HGB 13.5 01/23/2015   HCT 40.2  01/23/2015   MCV 76.8 (L) 01/23/2015   PLT 164 01/23/2015    Lab Results  Component Value Date   CREATININE 0.98 01/23/2015    No results found for: PSA  No results found for: TESTOSTERONE  Lab Results  Component Value Date   HGBA1C 6.8 (H) 05/22/2012    No results found for: TSH  No results found for: CHOL, HDL, CHOLHDL, VLDL, LDLCALC  Lab Results  Component  Value Date   AST 21 05/23/2012   Lab Results  Component Value Date   ALT 20 05/23/2012   No components found for: ALKALINEPHOPHATASE No components found for: BILIRUBINTOTAL  No results found for: ESTRADIOL  Urinalysis Component     Latest Ref Rng & Units 12/13/2018  Specific Gravity, UA     1.005 - 1.030 1.020  pH, UA     5.0 - 7.5 5.0  Color, UA     Yellow Yellow  Appearance Ur     Clear Clear  Leukocytes,UA     Negative Negative  Protein,UA     Negative/Trace Negative  Glucose, UA     Negative Negative  Ketones, UA     Negative Negative  RBC, UA     Negative Negative  Bilirubin, UA     Negative Negative  Urobilinogen, Ur     0.2 - 1.0 mg/dL 0.2  Nitrite, UA     Negative Negative  Microscopic Examination      See below:   I have reviewed the labs.   Pertinent Imaging: Results for ADDISON, SCHARTNER (MRN [id number]) as of 12/18/2018 08:36  Ref. Range 12/13/2018 11:18  Scan Result Unknown 22mL     Assessment & Plan:    1. OAB (overactive bladder) Patient is interested in undergoing another treatment of Botox We will need to check with his insurance to see if they will cover the Botox injection in the office   2. History of bladder cancer He is due for another surveillance cystoscopy in 08/2019   Return for schedule Botox in the office - need to speak with michelle first .  These notes generated with voice recognition software. I apologize for typographical errors.  Zara Council, PA-C  [facility] [address]  [place], [place] [address]  [phone]

## 2019-01-31 ENCOUNTER — Encounter: Payer: Self-pay | Admitting: Urology

## 2019-01-31 ENCOUNTER — Other Ambulatory Visit: Payer: Self-pay

## 2019-01-31 ENCOUNTER — Telehealth: Payer: Self-pay | Admitting: Urology

## 2019-01-31 ENCOUNTER — Ambulatory Visit: Payer: Medicare Other | Admitting: Urology

## 2019-01-31 VITALS — BP 114/69 | HR 76 | Ht 67.0 in | Wt 223.0 lb

## 2019-01-31 DIAGNOSIS — N3281 Overactive bladder: Secondary | ICD-10-CM | POA: Diagnosis not present

## 2019-01-31 NOTE — Telephone Encounter (Signed)
Can you check into this for [facility] please?  thanks

## 2019-01-31 NOTE — Telephone Encounter (Signed)
[name] would like to have another treatment with Botox, but this time he would like it in the office.  We need to check with his insurance to make sure they will cover the injections.  His last Botox was in July.

## 2019-02-09 NOTE — Telephone Encounter (Signed)
Per patients insurance ([place] [employer] [id number]) , prior authorization is NOT required for botox (procedure code [phone] and drug code [id number]).  Reference # [id number].  Please proceed with contacting the patient to schedule Botox with Dr. Erlene Quan.

## 2019-02-09 NOTE — Telephone Encounter (Signed)
Spoke with patient and scheduled him for next Botox in office on 03-20-2019. Patient understands he will need to drop of a UA 1wk prior to check on infection and to come 25min early to botox apt for lidocaine instillation

## 2019-03-09 ENCOUNTER — Other Ambulatory Visit: Payer: Self-pay | Admitting: *Deleted

## 2019-03-09 DIAGNOSIS — N3281 Overactive bladder: Secondary | ICD-10-CM

## 2019-03-12 ENCOUNTER — Other Ambulatory Visit: Payer: Self-pay

## 2019-03-12 ENCOUNTER — Other Ambulatory Visit: Payer: Medicare HMO

## 2019-03-12 DIAGNOSIS — N3281 Overactive bladder: Secondary | ICD-10-CM

## 2019-03-12 LAB — URINALYSIS, COMPLETE
Bilirubin, UA: NEGATIVE
Glucose, UA: NEGATIVE
Leukocytes,UA: NEGATIVE
Nitrite, UA: NEGATIVE
RBC, UA: NEGATIVE
Specific Gravity, UA: >1.03 — ABNORMAL HIGH (ref 1.005–1.030)
Urobilinogen, Ur: 0.2 mg/dL (ref 0.2–1.0)
pH, UA: 5.5 (ref 5.0–7.5)

## 2019-03-12 LAB — MICROSCOPIC EXAMINATION
Bacteria, UA: NONE SEEN
RBC, Urine: NONE SEEN /hpf (ref 0–2)

## 2019-03-14 ENCOUNTER — Other Ambulatory Visit: Payer: Self-pay | Admitting: Orthopedic Surgery

## 2019-03-14 DIAGNOSIS — M546 Pain in thoracic spine: Secondary | ICD-10-CM

## 2019-03-14 DIAGNOSIS — M5441 Lumbago with sciatica, right side: Secondary | ICD-10-CM

## 2019-03-20 ENCOUNTER — Other Ambulatory Visit: Payer: Medicare HMO

## 2019-03-20 ENCOUNTER — Other Ambulatory Visit: Payer: Self-pay | Admitting: *Deleted

## 2019-03-20 ENCOUNTER — Other Ambulatory Visit: Payer: Self-pay

## 2019-03-20 ENCOUNTER — Ambulatory Visit: Payer: Self-pay | Admitting: Urology

## 2019-03-20 DIAGNOSIS — R350 Frequency of micturition: Secondary | ICD-10-CM

## 2019-03-21 ENCOUNTER — Encounter: Payer: Self-pay | Admitting: Urology

## 2019-03-21 ENCOUNTER — Ambulatory Visit (INDEPENDENT_AMBULATORY_CARE_PROVIDER_SITE_OTHER): Payer: Medicare HMO | Admitting: Urology

## 2019-03-21 VITALS — BP 121/68 | HR 86 | Ht 67.0 in | Wt 236.0 lb

## 2019-03-21 DIAGNOSIS — N3281 Overactive bladder: Secondary | ICD-10-CM

## 2019-03-21 DIAGNOSIS — Z8551 Personal history of malignant neoplasm of bladder: Secondary | ICD-10-CM

## 2019-03-21 LAB — URINALYSIS, COMPLETE
Bilirubin, UA: NEGATIVE
Glucose, UA: NEGATIVE
Ketones, UA: NEGATIVE
Leukocytes,UA: NEGATIVE
Nitrite, UA: NEGATIVE
Protein,UA: NEGATIVE
RBC, UA: NEGATIVE
Specific Gravity, UA: 1.025 (ref 1.005–1.030)
Urobilinogen, Ur: 0.2 mg/dL (ref 0.2–1.0)
pH, UA: 5.5 (ref 5.0–7.5)

## 2019-03-21 LAB — MICROSCOPIC EXAMINATION
Bacteria, UA: NONE SEEN
RBC, Urine: NONE SEEN /hpf (ref 0–2)

## 2019-03-21 MED ORDER — ONABOTULINUMTOXINA 100 UNITS IJ SOLR
100.0000 [IU] | Freq: Once | INTRAMUSCULAR | Status: AC
  Administered 2019-03-21: 100 [IU] via INTRAMUSCULAR

## 2019-03-21 MED ORDER — LIDOCAINE HCL 2 % IJ SOLN
60.0000 mL | Freq: Once | INTRAMUSCULAR | Status: AC
  Administered 2019-03-21: 1200 mg

## 2019-03-21 MED ORDER — CEFTRIAXONE SODIUM 1 G IJ SOLR
1.0000 g | Freq: Once | INTRAMUSCULAR | Status: AC
  Administered 2019-03-21: 1 g via INTRAMUSCULAR

## 2019-03-21 NOTE — Progress Notes (Signed)
Bladder Instillation  Due to Botox injection patient is present today for a Bladder Instillation of Lidocaine. Patient was cleaned and prepped in a sterile fashion with betadine and lidocaine 2% jelly was instilled into the urethra.  A 14FR catheter was inserted, urine return was noted 184ml, urine was yellow in color.  60 ml of lidocaine was instilled into the bladder. The catheter was then removed. Patient tolerated well, no complications were noted Patient held in bladder for 30 minutes prior to procedure starting.   Performed by: Verlene Mayer, [place]

## 2019-03-21 NOTE — Progress Notes (Signed)
   03/21/19  CC: botox  HPI: 74 year old male with severe refractory OAB managed on Botox who presents today for cystoscopic Botox injection.  He had excellent result with his initial injection but less efficacy with the last.  Preprocedural urinalysis completely negative.  Please see CMA note, patient underwent instillation of 2% lidocaine which is allowed to dwell in the bladder for total of 30 minutes prior to the procedure.  This was well-tolerated.  A timeout protocol was performed confirming the patient's identity as well as procedure.  He did receive periprocedural antibiotics in the form of IM ceftriaxone.  Blood pressure 121/68, pulse 86, height 5\' 7"  (1.702 m), weight 236 lb (107 kg). NED. A&Ox3.   No respiratory distress   Abd soft, NT, ND Normal phallus with bilateral descended testicles  Cystoscopy Procedure Note  Patient identification was confirmed, informed consent was obtained, and patient was prepped using Betadine solution.  Lidocaine jelly was administered per urethral meatus.     Pre-Procedure: - Inspection reveals a normal caliber ureteral meatus.  Procedure: The flexible cystoscope was introduced without difficulty - No urethral strictures/lesions are present. -Prostate was fairly unremarkable -Bladder itself was also unremarkable without any lesions, tumors, or or cancer recurrence.  Is moderately trabeculated.  This point time, flexible injection needle was brought in and a total of 100 units of Botox was injected into the bladder over 20 injections given in 2 rows of 10 on the inferior and posterior bladder wall.  Procedure itself was well-tolerated.   Post-Procedure: - Patient tolerated the procedure well  Assessment/ Plan:  1. OAB (overactive bladder) Status post uncomplicated Botox injection in the office today  Plan for reassessment of urinary symptoms about 3 months  - botulinum toxin Type A (BOTOX) injection 100 Units - lidocaine  (XYLOCAINE) 2 % (with pres) injection 1,200 mg  2. History of bladder cancer Due for surveillance cystoscopy 08/2019 - cefTRIAXone (ROCEPHIN) injection 1 g    Return in about 3 months (around 06/19/2019) for [facility].  [name], MD

## 2019-03-22 LAB — CULTURE, URINE COMPREHENSIVE

## 2019-03-24 ENCOUNTER — Other Ambulatory Visit: Payer: Self-pay

## 2019-03-24 ENCOUNTER — Ambulatory Visit
Admission: RE | Admit: 2019-03-24 | Discharge: 2019-03-24 | Disposition: A | Discharge status: Home / Self Care | Payer: Medicare HMO | Source: Ambulatory Visit | Attending: Orthopedic Surgery | Admitting: Orthopedic Surgery

## 2019-03-24 DIAGNOSIS — M546 Pain in thoracic spine: Secondary | ICD-10-CM | POA: Insufficient documentation

## 2019-03-24 DIAGNOSIS — M5441 Lumbago with sciatica, right side: Secondary | ICD-10-CM | POA: Insufficient documentation

## 2019-03-30 ENCOUNTER — Ambulatory Visit (INDEPENDENT_AMBULATORY_CARE_PROVIDER_SITE_OTHER): Payer: Medicare Other | Admitting: Vascular Surgery

## 2019-06-05 ENCOUNTER — Ambulatory Visit: Payer: Medicare Other | Admitting: Urology

## 2019-06-05 ENCOUNTER — Encounter: Payer: Self-pay | Admitting: Urology

## 2019-06-05 ENCOUNTER — Ambulatory Visit: Payer: Medicare HMO | Admitting: Urology

## 2019-06-05 ENCOUNTER — Telehealth: Payer: Self-pay | Admitting: Urology

## 2019-06-05 ENCOUNTER — Other Ambulatory Visit: Payer: Self-pay

## 2019-06-05 VITALS — BP 148/79 | HR 73 | Ht 67.0 in | Wt 225.0 lb

## 2019-06-05 DIAGNOSIS — N3281 Overactive bladder: Secondary | ICD-10-CM | POA: Diagnosis not present

## 2019-06-05 DIAGNOSIS — N433 Hydrocele, unspecified: Secondary | ICD-10-CM

## 2019-06-05 DIAGNOSIS — Z8551 Personal history of malignant neoplasm of bladder: Secondary | ICD-10-CM | POA: Diagnosis not present

## 2019-06-05 LAB — BLADDER SCAN AMB NON-IMAGING: Scan Result: 62

## 2019-06-05 MED ORDER — MIRABEGRON ER 25 MG PO TB24: 25.0000 mg | ORAL_TABLET | Freq: Every day | ORAL | 0 refills | Status: DC

## 2019-06-05 NOTE — Telephone Encounter (Signed)
Would you get him scheduled for his surveillance cystoscopy in July with Dr. Erlene Quan?

## 2019-06-05 NOTE — Progress Notes (Signed)
06/05/2019 10:48 AM   [name] 02/10/1946 [id number]  Referring provider: Ricardo Jericho, NP [address] [place],  [name] [address]  Chief Complaint  Patient presents with  . Over Active Bladder    HPI: Mr. Abila is a 74 year old male with refractory OAB and a history of bladder cancer who presents today for follow up after undergoing a second Botox treatment.    Refractory OAB He went underwent his first Botox injection in 11/2017 and experienced great results.  His second injection was in 08/2018 and he states it never gave him control over his symptoms.  He is experiencing urgency, incontinence and intermittency.  He is concerned that he experienced results almost immediately after his first Botox injection. His urine culture grew out corynebacterium glucuronolyticum.  He was placed on PCN.  He states the antibiotic did not make a difference whatsoever.  He is still experiencing the urgency, incontinence and intermittency.  He underwent a second Botox injection on 03/21/2019 with Dr. Erlene Quan.  He states it made no difference whatsoever.  He is still having frequency, urgency, incontinence and a weak/split urinary stream.  He also noted that he had some swelling in his scrotum.  This has been occurring for 6 months.  He denies any trauma to the scrotum.  Patient denies any modifying or aggravating factors.  Patient denies any gross hematuria, dysuria or suprapubic/flank pain.  Patient denies any fevers, chills, nausea or vomiting.   Bladder cancer He will be due for a surveillance cystoscopy in 08/2019   PMH: Past Medical History:  Diagnosis Date  . Anxiety   . Arteriosclerosis of coronary artery 11/16/2011   Overview:  Stent 10/2011 stent rca 2015 with collaterals to lad which is chronically occluded   . Benign enlargement of prostate   . Benign essential HTN 06/11/2014  . Benign prostatic hypertrophy without urinary obstruction 07/31/2014  . Bilateral cataracts  05/16/2013   Overview:  Dr. [facility]    . Bone spur of foot    Left  . BP (high blood pressure) 11/09/2012  . Cancer ([place])    skin (forehead) and bladder  . Carotid artery narrowing 02/08/2014  . Depression   . Detrusor hypertrophy   . Diabetes ([place])   . Diabetes mellitus, type 2 ([place]) 12/12/2012  . Diverticulosis   . Dyspnea   . Esophageal reflux   . Esophageal reflux   . Fothergill's neuralgia 08/07/2012   Overview:  [facility]   . Gastritis   . GERD (gastroesophageal reflux disease)   . Headache    cluster headaches  . 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 Hearing loss in left ear   . Heart disease   . Hematuria   . Hemorrhoids   . History of hiatal hernia 12/14/2017   small   . Hypercholesteremia   . Lesion of bladder   . Myocardial infarct ([place])   . Presence of stent in coronary artery 11/09/2012  . Rectal bleeding   . Trigeminal neuralgia   . Trigeminal neuralgia   . Valvular heart disease   . Vitamin D deficiency     Surgical History: Past Surgical History:  Procedure Laterality Date  . APPENDECTOMY    . BOTOX INJECTION N/A 12/21/2017   Procedure: Bladder BOTOX INJECTION;  Surgeon: [name], MD;  Location: [facility] ORS;  Service: Urology;  Laterality: N/A;  . BOTOX INJECTION N/A 09/11/2018   Procedure: Bladder BOTOX INJECTION;  Surgeon: [name], MD;  Location: [facility];  Service: Urology;  Laterality: N/A;  . CARDIAC CATHETERIZATION    . CARDIAC CATHETERIZATION N/A 01/22/2015   Procedure: Left Heart Cath;  Surgeon: Corey Skains, MD;  Location: [place] CV LAB;  Service: Cardiovascular;  Laterality: N/A;  . CARDIAC CATHETERIZATION N/A 01/22/2015   Procedure: Coronary Stent Intervention;  Surgeon: Isaias Cowman, MD;  Location: [place] CV LAB;  Service: Cardiovascular;  Laterality: N/A;  . CATARACT EXTRACTION, BILATERAL    . COLONOSCOPY WITH PROPOFOL N/A 12/08/2015   Procedure: COLONOSCOPY WITH PROPOFOL;  Surgeon:  Lollie Sails, MD;  Location: [facility];  Service: Endoscopy;  Laterality: N/A;  . COLONOSCOPY WITH PROPOFOL N/A 12/09/2015   Procedure: COLONOSCOPY WITH PROPOFOL;  Surgeon: Lollie Sails, MD;  Location: [facility] ENDOSCOPY;  Service: Endoscopy;  Laterality: N/A;  . CORONARY ANGIOPLASTY     5 stents  . CORONARY STENT PLACEMENT  2015   x5  . CYSTOSCOPY N/A 09/11/2018   Procedure: CYSTOSCOPY;  Surgeon: [name], MD;  Location: [facility] ORS;  Service: Urology;  Laterality: N/A;  . CYSTOSCOPY WITH BIOPSY N/A 12/21/2017   Procedure: CYSTOSCOPY WITH BIOPSY;  Surgeon: [name], MD;  Location: [facility] ORS;  Service: Urology;  Laterality: N/A;  . ESOPHAGOGASTRODUODENOSCOPY (EGD) WITH PROPOFOL N/A 12/08/2015   Procedure: ESOPHAGOGASTRODUODENOSCOPY (EGD) WITH PROPOFOL;  Surgeon: Lollie Sails, MD;  Location: [facility] ENDOSCOPY;  Service: Endoscopy;  Laterality: N/A;  . ESOPHAGOGASTRODUODENOSCOPY (EGD) WITH PROPOFOL N/A 12/13/2017   Procedure: ESOPHAGOGASTRODUODENOSCOPY (EGD) WITH PROPOFOL;  Surgeon: Lollie Sails, MD;  Location: [facility] ENDOSCOPY;  Service: Endoscopy;  Laterality: N/A;  . EYE SURGERY    . HERNIA REPAIR    . kidney tumor remove    . TRANSURETHRAL RESECTION OF BLADDER TUMOR WITH GYRUS (TURBT-GYRUS)  12/2013  . UMBILICAL HERNIA REPAIR    . urethral meatotomy      Home Medications:  Allergies as of 06/05/2019      Reactions   Ace Inhibitors Cough   Carbamazepine Rash   Other reaction(s): RASH Other reaction(s): RASH   Lyrica [pregabalin] Rash      Medication List       Accurate as of June 05, 2019 10:48 AM. If you have any questions, ask your nurse or doctor.        aspirin 81 MG tablet Take 81 mg by mouth daily.   citalopram 20 MG tablet Commonly known as: CELEXA Take 20 mg by mouth at bedtime.   Docusate Sodium 100 MG capsule Take by mouth.   furosemide 20 MG tablet Commonly known as: LASIX Take by mouth.   gabapentin 100 MG capsule Commonly  known as: NEURONTIN Take 100 mg by mouth 3 (three) times daily.   gabapentin 300 MG capsule Commonly known as: NEURONTIN Take 1,200 mg by mouth 2 (two) times daily.   hydrocortisone 2.5 % ointment APPLY TOPICALLY 2 TIMES A DAY. FOR SCALING OF EARS AND FACE AS NEEDED. AVOID USE ON ROSACEA   isosorbide mononitrate 30 MG 24 hr tablet Commonly known as: IMDUR Take 60 mg by mouth daily.   lactulose 10 GM/15ML solution Commonly known as: CHRONULAC Take by mouth.   mirabegron ER 25 MG Tb24 tablet Commonly known as: MYRBETRIQ Take 1 tablet (25 mg total) by mouth daily.   multivitamins with iron Tabs tablet Take 1 tablet by mouth daily.   nitroGLYCERIN 0.4 MG SL tablet Commonly known as: NITROSTAT Place under the tongue.   Omega-3 1000 MG Caps Take 1 g by mouth daily.  Reported on 03/21/2015   ONE TOUCH ULTRA TEST test strip Generic drug: glucose blood USE 3 (THREE) TIMES DAILY USE AS INSTRUCTED.- ONE TOUCH ULTRA BLOOD GLUCOSE METER STRIPS   pantoprazole 40 MG tablet Commonly known as: PROTONIX Take 40 mg by mouth daily.   penicillin v potassium 500 MG tablet Commonly known as: VEETID Take 1 tablet (500 mg total) by mouth 3 (three) times daily.   pramipexole 1 MG tablet Commonly known as: MIRAPEX Take 1 mg by mouth daily.   pravastatin 80 MG tablet Commonly known as: PRAVACHOL TAKE 1 TABLET (80 MG TOTAL) BY MOUTH NIGHTLY.   RA Blood Glucose Monitor Devi by Does not apply route.   sildenafil 50 MG tablet Commonly known as: VIAGRA Take 50 mg by mouth daily as needed for erectile dysfunction.   terbinafine 250 MG tablet Commonly known as: LAMISIL Take by mouth.   Trileptal 150 MG tablet Generic drug: OXcarbazepine Take 150 mg by mouth at bedtime.   valsartan 40 MG tablet Commonly known as: DIOVAN Take 40 mg by mouth daily.   Vitamin D-1000 Max St 25 MCG (1000 UT) tablet Generic drug: Cholecalciferol Take 1,000 Units by mouth daily.       Allergies:   Allergies  Allergen Reactions  . Ace Inhibitors Cough  . Carbamazepine Rash    Other reaction(s): RASH Other reaction(s): RASH   . Lyrica [Pregabalin] Rash    Family History: Family History  Problem Relation Age of Onset  . Kidney cancer Mother   . Prostate cancer Neg Hx     Social History:  reports that he quit smoking about 29 years ago. His smoking use included cigarettes. He has a 80.00 pack-year smoking history. He has never used smokeless tobacco. He reports previous alcohol use. He reports that he does not use drugs.  ROS: For pertinent review of systems please refer to history of present illness  Physical Exam: BP (!) 148/79   Pulse 73   Ht 5\' 7"  (1.702 m)   Wt 225 lb (102.1 kg)   BMI 35.24 kg/m   Constitutional:  Well nourished. Alert and oriented, No acute distress. HEENT: [place] AT, mask in place.  Trachea midline, no masses. Cardiovascular: No clubbing, cyanosis, or edema. Respiratory: Normal respiratory effort, no increased work of breathing. GI: Abdomen is soft, non tender, non distended, no abdominal masses.  GU: No CVA tenderness.  No bladder fullness or masses.  Patient with circumcised phallus.  Urethral meatus is patent.  No penile discharge. No penile lesions or rashes. Scrotum without lesions, cysts, rashes and/or edema.  Testicles are located scrotally bilaterally. No masses are appreciated in the testicles. Left and right epididymis are normal.  He has bilateral hydroceles.  Rectal: Not performed. Skin: No rashes, bruises or suspicious lesions. Lymph: No inguinal adenopathy. Neurologic: Grossly intact, no focal deficits, moving all 4 extremities. Psychiatric: Normal mood and affect.  Laboratory Data: Lab Results  Component Value Date   WBC 7.5 01/23/2015   HGB 13.5 01/23/2015   HCT 40.2 01/23/2015   MCV 76.8 (L) 01/23/2015   PLT 164 01/23/2015    Lab Results  Component Value Date   CREATININE 0.98 01/23/2015    Lab Results  Component Value  Date   HGBA1C 6.8 (H) 05/22/2012    Lab Results  Component Value Date   AST 21 05/23/2012   Lab Results  Component Value Date   ALT 20 05/23/2012   I have reviewed the labs.   Pertinent Imaging: Results for  DEONDRICK, HASELEY (MRN [id number]) as of 06/05/2019 10:27  Ref. Range 06/05/2019 09:48  Scan Result Unknown 62    Assessment & Plan:    1. OAB (overactive bladder) Patient has failed 2 treatments of Botox Patient is not a candidate for anticholinergics due to memory issues Myrbetriq was found to be cost prohibitive, but he did not try the medication so unsure of its effectiveness I have given the patient 4 weeks of Myrbetriq samples and he will return in 3 weeks for IPSS and PVR If he finds that the Myrbetriq is effective in controlling his urinary symptoms we will attempt to find a more less costly alternative for him to receive the Myrbetriq medication, if not he is also interested in PTNS and we will inquiry his insurance to see the coverage regarding this as well  2.  Bilateral hydroceles Explained to the patient that a hydrocele is a collection of peritoneal fluid between the parietal and visceral layers of the tunica vaginalis, which directly surrounds the testis and spermatic cord.  It is the same layer that forms the peritoneal lining of the abdomen. 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  I explained that most hydroceles do not require intervention. Treatment is only indicated in patients who are symptomatic with pain or a pressure sensation or when the scrotal skin integrity is compromised from chronic irritation. For asymptomatic patients with hydroceles, there is no need for routine follow-up. The most common surgical procedure is excision of the hydrocele sac. Simple  aspiration is generally unsuccessful because of the rapid re accumulation of fluid but may be effective if combined with instillation of a sclerosing agent (eg, tetracycline, alcohol) into the sac. The potential risks of this approach are a low incidence of reactive orchitis/epididymitis and a higher rate of recurrence, which may then make open surgery more difficult because of the development of adhesions between the hydrocele sac and the scrotal contents. He would like to continue conservative management of his hydroceles at this time  3. History of bladder cancer He is due for another surveillance cystoscopy in 08/2019   Return in about 3 weeks (around 06/26/2019) for IPSS and PVR.  These notes generated with voice recognition software. I apologize for typographical errors.  Zara Council, PA-C  [facility] [address]  [place], [place] [address] [phone]

## 2019-06-05 NOTE — Telephone Encounter (Signed)
Patient aware of appointment

## 2019-06-11 ENCOUNTER — Telehealth: Payer: Self-pay | Admitting: *Deleted

## 2019-06-11 ENCOUNTER — Telehealth: Payer: Self-pay | Admitting: Urology

## 2019-06-11 NOTE — Telephone Encounter (Signed)
Alle from [facility] called for nurse Janett Billow pt  Procedure has been approved code [id number] with 12 units   Auth# [id number]  Any questions her cb [phone]

## 2019-06-11 NOTE — Telephone Encounter (Signed)
Procedure has been approved code [id number] with 12 units   Auth# [id number] .

## 2019-06-11 NOTE — Telephone Encounter (Signed)
Send off waiting for approval.

## 2019-06-25 ENCOUNTER — Ambulatory Visit (INDEPENDENT_AMBULATORY_CARE_PROVIDER_SITE_OTHER): Payer: Medicare HMO

## 2019-06-25 ENCOUNTER — Other Ambulatory Visit: Payer: Self-pay

## 2019-06-25 DIAGNOSIS — N3281 Overactive bladder: Secondary | ICD-10-CM | POA: Diagnosis not present

## 2019-06-25 NOTE — Progress Notes (Signed)
PTNS  Session # 1  Health & Social Factors: No Change Caffeine: 0 Alcohol: 0 Daytime voids #per day: 10 Night-time voids #per night: 2 Urgency: Strong Incontinence Episodes #per day: 2 Ankle used: Right Treatment Setting: 11 Feeling/ Response: Both Comments: Patient tolerated well, no complications noted.  Preformed By: Bradly Bienenstock, CMA    Follow Up: 1 week

## 2019-06-25 NOTE — Patient Instructions (Signed)
Tracking Your Bladder Symptoms    Patient Name:___________________________________________________   Sample: Day   Daytime Voids  Nighttime Voids Urgency for the Day(0-4) Number of Accidents Beverage Comments  Monday IIII II 2 I Water IIII Coffee  I      Week Starting:____________________________________   Day Daytime  Voids Nighttime  Voids Urgency for  The Day(0-4) Number of Accidents Beverages Comments                                                           This week my symptoms were:  O much better  O better O the same O worse   

## 2019-06-28 ENCOUNTER — Ambulatory Visit (INDEPENDENT_AMBULATORY_CARE_PROVIDER_SITE_OTHER): Payer: Medicare HMO | Admitting: Urology

## 2019-06-28 ENCOUNTER — Encounter: Payer: Self-pay | Admitting: Urology

## 2019-06-28 ENCOUNTER — Other Ambulatory Visit: Payer: Self-pay

## 2019-06-28 VITALS — BP 124/67 | HR 75 | Ht 67.0 in | Wt 224.0 lb

## 2019-06-28 DIAGNOSIS — N3281 Overactive bladder: Secondary | ICD-10-CM

## 2019-06-28 DIAGNOSIS — N433 Hydrocele, unspecified: Secondary | ICD-10-CM | POA: Diagnosis not present

## 2019-06-28 DIAGNOSIS — Z8551 Personal history of malignant neoplasm of bladder: Secondary | ICD-10-CM | POA: Diagnosis not present

## 2019-06-28 LAB — BLADDER SCAN AMB NON-IMAGING: Scan Result: 195

## 2019-06-28 NOTE — Progress Notes (Signed)
06/28/2019 9:29 AM   [name] 11/08/1945 [id number]  Referring provider: Ricardo Jericho, NP [address] [place],  [place] [address]  Chief Complaint  Patient presents with  . Over Active Bladder    HPI: [name] is a 74 year old male with refractory OAB, bilateral hydroceles and bladder cancer who presents today for follow up.  Refractory OAB He went underwent his first Botox injection in 11/2017 and experienced great results.  His second injection was in 08/2018 and he states it never gave him control over his symptoms.  He is experiencing urgency, incontinence and intermittency.  He is concerned that he experienced results almost immediately after his first Botox injection. His urine culture grew out corynebacterium glucuronolyticum.  He was placed on PCN.  He states the antibiotic did not make a difference whatsoever.  He underwent a second Botox injection on 03/21/2019 with Dr. Erlene Quan.  He states the second Botox injection was not helpful.  He had a trial of Myrbetriq samples, but the prescription is still cost prohibitive.  He has started PTNS and had his first treatment on Monday.    I PSS score:  11/5    PVR: 195 mL   IPSS    Row Name 06/28/19 0900         International Prostate Symptom Score   How often have you had the sensation of not emptying your bladder?  Less than 1 in 5     How often have you had to urinate less than every two hours?  Less than half the time     How often have you found you stopped and started again several times when you urinated?  More than half the time     How often have you found it difficult to postpone urination?  Not at All     How often have you had a weak urinary stream?  Less than half the time     How often have you had to strain to start urination?  Not at All     How many times did you typically get up at night to urinate?  2 Times     Total IPSS Score  11       Quality of Life due to urinary symptoms   If you  were to spend the rest of your life with your urinary condition just the way it is now how would you feel about that?  Unhappy        Score:  1-7 Mild 8-19 Moderate 20-35 Severe  Bilateral hydroceles Unchanged.  Not bothersome.  Bladder cancer He will be due for a surveillance cystoscopy in 08/2019   PMH: Past Medical History:  Diagnosis Date  . Anxiety   . Arteriosclerosis of coronary artery 11/16/2011   Overview:  Stent 10/2011 stent rca 2015 with collaterals to lad which is chronically occluded   . Benign enlargement of prostate   . Benign essential HTN 06/11/2014  . Benign prostatic hypertrophy without urinary obstruction 07/31/2014  . Bilateral cataracts 05/16/2013   Overview:  Dr. [facility]    . Bone spur of foot    Left  . BP (high blood pressure) 11/09/2012  . Cancer ([place])    skin (forehead) and bladder  . Carotid artery narrowing 02/08/2014  . Depression   . Detrusor hypertrophy   . Diabetes ([place])   . Diabetes mellitus, type 2 ([place]) 12/12/2012  . Diverticulosis   . Dyspnea   .  Esophageal reflux   . Esophageal reflux   . Fothergill's neuralgia 08/07/2012   Overview:  [facility]   . Gastritis   . GERD (gastroesophageal reflux disease)   . Headache    cluster headaches  . 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 Hearing loss in left ear   . Heart disease   . Hematuria   . Hemorrhoids   . History of hiatal hernia 12/14/2017   small   . Hypercholesteremia   . Lesion of bladder   . Myocardial infarct ([place])   . Presence of stent in coronary artery 11/09/2012  . Rectal bleeding   . Trigeminal neuralgia   . Trigeminal neuralgia   . Valvular heart disease   . Vitamin D deficiency     Surgical History: Past Surgical History:  Procedure Laterality Date  . APPENDECTOMY    . BOTOX INJECTION N/A 12/21/2017   Procedure: Bladder BOTOX INJECTION;  Surgeon: [name], MD;  Location: [facility];  Service: Urology;  Laterality: N/A;  . BOTOX INJECTION  N/A 09/11/2018   Procedure: Bladder BOTOX INJECTION;  Surgeon: [name], MD;  Location: [facility];  Service: Urology;  Laterality: N/A;  . CARDIAC CATHETERIZATION    . CARDIAC CATHETERIZATION N/A 01/22/2015   Procedure: Left Heart Cath;  Surgeon: Corey Skains, MD;  Location: [place] [facility];  Service: Cardiovascular;  Laterality: N/A;  . CARDIAC CATHETERIZATION N/A 01/22/2015   Procedure: Coronary Stent Intervention;  Surgeon: Isaias Cowman, MD;  Location: [place] [facility];  Service: Cardiovascular;  Laterality: N/A;  . CATARACT EXTRACTION, BILATERAL    . COLONOSCOPY WITH PROPOFOL N/A 12/08/2015   Procedure: COLONOSCOPY WITH PROPOFOL;  Surgeon: Lollie Sails, MD;  Location: [facility]OSCOPY;  Service: Endoscopy;  Laterality: N/A;  . COLONOSCOPY WITH PROPOFOL N/A 12/09/2015   Procedure: COLONOSCOPY WITH PROPOFOL;  Surgeon: Lollie Sails, MD;  Location: [facility] ENDOSCOPY;  Service: Endoscopy;  Laterality: N/A;  . CORONARY ANGIOPLASTY     5 stents  . CORONARY STENT PLACEMENT  2015   x5  . CYSTOSCOPY N/A 09/11/2018   Procedure: CYSTOSCOPY;  Surgeon: [name], MD;  Location: [facility];  Service: Urology;  Laterality: N/A;  . CYSTOSCOPY WITH BIOPSY N/A 12/21/2017   Procedure: CYSTOSCOPY WITH BIOPSY;  Surgeon: [name], MD;  Location: [facility];  Service: Urology;  Laterality: N/A;  . ESOPHAGOGASTRODUODENOSCOPY (EGD) WITH PROPOFOL N/A 12/08/2015   Procedure: ESOPHAGOGASTRODUODENOSCOPY (EGD) WITH PROPOFOL;  Surgeon: Lollie Sails, MD;  Location: [facility] ENDOSCOPY;  Service: Endoscopy;  Laterality: N/A;  . ESOPHAGOGASTRODUODENOSCOPY (EGD) WITH PROPOFOL N/A 12/13/2017   Procedure: ESOPHAGOGASTRODUODENOSCOPY (EGD) WITH PROPOFOL;  Surgeon: Lollie Sails, MD;  Location: [facility] ENDOSCOPY;  Service: Endoscopy;  Laterality: N/A;  . EYE SURGERY    . HERNIA REPAIR    . kidney tumor remove    . TRANSURETHRAL RESECTION OF BLADDER TUMOR WITH GYRUS (TURBT-GYRUS)  12/2013   . UMBILICAL HERNIA REPAIR    . urethral meatotomy      Home Medications:  Allergies as of 06/28/2019      Reactions   Ace Inhibitors Cough   Carbamazepine Rash   Other reaction(s): RASH Other reaction(s): RASH   Lyrica [pregabalin] Rash      Medication List       Accurate as of June 28, 2019  9:29 AM. If you have any questions, ask your nurse or doctor.        aspirin 81 MG tablet Take 81 mg by mouth daily.   citalopram  20 MG tablet Commonly known as: CELEXA Take 20 mg by mouth at bedtime.   Docusate Sodium 100 MG capsule Take by mouth.   furosemide 20 MG tablet Commonly known as: LASIX Take by mouth.   gabapentin 100 MG capsule Commonly known as: NEURONTIN Take 100 mg by mouth 3 (three) times daily.   gabapentin 300 MG capsule Commonly known as: NEURONTIN Take 1,200 mg by mouth 2 (two) times daily.   hydrocortisone 2.5 % ointment APPLY TOPICALLY 2 TIMES A DAY. FOR SCALING OF EARS AND FACE AS NEEDED. AVOID USE ON ROSACEA   isosorbide mononitrate 30 MG 24 hr tablet Commonly known as: IMDUR Take 60 mg by mouth daily.   lactulose 10 GM/15ML solution Commonly known as: CHRONULAC Take by mouth.   mirabegron ER 25 MG Tb24 tablet Commonly known as: MYRBETRIQ Take 1 tablet (25 mg total) by mouth daily.   multivitamins with iron Tabs tablet Take 1 tablet by mouth daily.   nitroGLYCERIN 0.4 MG SL tablet Commonly known as: NITROSTAT Place under the tongue.   Omega-3 1000 MG Caps Take 1 g by mouth daily. Reported on 03/21/2015   ONE TOUCH ULTRA TEST test strip Generic drug: glucose blood USE 3 (THREE) TIMES DAILY USE AS INSTRUCTED.- ONE TOUCH ULTRA BLOOD GLUCOSE METER STRIPS   pantoprazole 40 MG tablet Commonly known as: PROTONIX Take 40 mg by mouth daily.   penicillin v potassium 500 MG tablet Commonly known as: VEETID Take 1 tablet (500 mg total) by mouth 3 (three) times daily.   pramipexole 1 MG tablet Commonly known as: MIRAPEX Take 1 mg by  mouth daily.   pravastatin 80 MG tablet Commonly known as: PRAVACHOL TAKE 1 TABLET (80 MG TOTAL) BY MOUTH NIGHTLY.   RA Blood Glucose Monitor Devi by Does not apply route.   sildenafil 50 MG tablet Commonly known as: VIAGRA Take 50 mg by mouth daily as needed for erectile dysfunction.   terbinafine 250 MG tablet Commonly known as: LAMISIL Take by mouth.   Trileptal 150 MG tablet Generic drug: OXcarbazepine Take 150 mg by mouth at bedtime.   valsartan 40 MG tablet Commonly known as: DIOVAN Take 40 mg by mouth daily.   Vitamin D-1000 Max St 25 MCG (1000 UT) tablet Generic drug: Cholecalciferol Take 1,000 Units by mouth daily.       Allergies:  Allergies  Allergen Reactions  . Ace Inhibitors Cough  . Carbamazepine Rash    Other reaction(s): RASH Other reaction(s): RASH   . Lyrica [Pregabalin] Rash    Family History: Family History  Problem Relation Age of Onset  . Kidney cancer Mother   . Prostate cancer Neg Hx     Social History:  reports that he quit smoking about 29 years ago. His smoking use included cigarettes. He has a 80.00 pack-year smoking history. He has never used smokeless tobacco. He reports previous alcohol use. He reports that he does not use drugs.  ROS: For pertinent review of systems please refer to history of present illness  Physical Exam: BP 124/67   Pulse 75   Ht 5\' 7"  (1.702 m)   Wt 224 lb (101.6 kg)   BMI 35.08 kg/m   Constitutional:  Well nourished. Alert and oriented, No acute distress. HEENT: [place] AT, mask in place.  Trachea midline. Cardiovascular: No clubbing, cyanosis, or edema. Respiratory: Normal respiratory effort, no increased work of breathing. GI: Abdomen is obese, soft, non tender, non distended.  Small umbilical hernia.   GU: No CVA  tenderness.  No bladder fullness or masses.  Patient with uncircumcised phallus.   Foreskin easily retracted   Urethral meatus is patent.  No penile discharge. No penile lesions or rashes.  Scrotum without lesions, cysts, rashes and/or edema.  Testicles are located scrotally bilaterally. No masses are appreciated in the testicles. Left and right epididymis are normal.  Bilateral hydroceles appreciated.  Neurologic: Grossly intact, no focal deficits, moving all 4 extremities. Psychiatric: Normal mood and affect.   Laboratory Data: Lab Results  Component Value Date   WBC 7.5 01/23/2015   HGB 13.5 01/23/2015   HCT 40.2 01/23/2015   MCV 76.8 (L) 01/23/2015   PLT 164 01/23/2015    Lab Results  Component Value Date   CREATININE 0.98 01/23/2015    Lab Results  Component Value Date   HGBA1C 6.8 (H) 05/22/2012    Lab Results  Component Value Date   AST 21 05/23/2012   Lab Results  Component Value Date   ALT 20 05/23/2012   I have reviewed the labs.   Pertinent Imaging: Results for DARREIN, CORNELISON (MRN [id number]) as of 06/05/2019 10:27  Ref. Range 06/05/2019 09:48  Scan Result Unknown 62    Assessment & Plan:    1. OAB (overactive bladder) Patient has failed 2 treatments of Botox Patient is not a candidate for anticholinergics due to memory issues Myrbetriq was found to be cost prohibitive, but he did feel the samples of Myrbetriq helped He has started PTNS  2.  Bilateral hydroceles Stable  3. History of bladder cancer Scheduled for cystoscopy with Dr. Erlene Quan on 09/04/2019  Return for return Monday for PTNS .  These notes generated with voice recognition software. I apologize for typographical errors.  Zara Council, PA-C  [facility] [address]  [place], [place] [address] [phone]

## 2019-06-29 ENCOUNTER — Ambulatory Visit: Payer: Self-pay | Admitting: Urology

## 2019-07-02 ENCOUNTER — Ambulatory Visit (INDEPENDENT_AMBULATORY_CARE_PROVIDER_SITE_OTHER): Payer: Medicare HMO

## 2019-07-02 ENCOUNTER — Other Ambulatory Visit: Payer: Self-pay

## 2019-07-02 DIAGNOSIS — N3281 Overactive bladder: Secondary | ICD-10-CM

## 2019-07-02 NOTE — Progress Notes (Signed)
PTNS  Session # 2  Health & Social Factors: No Change Caffeine: 1 Alcohol: 0 Daytime voids #per day: 7 Night-time voids #per night: 2 Urgency: Mild Incontinence Episodes #per day: 0 Ankle used: Right (suggested left, pt declined) Treatment Setting: 12 Feeling/ Response: Sensory  Comments: Pt tolerated well  Preformed By: Gordy Clement, CMA   Follow Up: RTC as scheduled in 1 week

## 2019-07-04 ENCOUNTER — Telehealth: Payer: Self-pay

## 2019-07-04 NOTE — Telephone Encounter (Signed)
Patient called asking if he can go back on myrbetriq, he states he is unable to hold his urine and can not tell if the PTNS is helping while off the medication

## 2019-07-05 ENCOUNTER — Telehealth: Payer: Self-pay

## 2019-07-05 NOTE — Telephone Encounter (Signed)
That would be fine to continue Myrbetriq.  Does he needs a prescription sent in?

## 2019-07-05 NOTE — Telephone Encounter (Signed)
Pt came to pick up samples.

## 2019-07-09 ENCOUNTER — Ambulatory Visit (INDEPENDENT_AMBULATORY_CARE_PROVIDER_SITE_OTHER): Payer: Medicare HMO

## 2019-07-09 ENCOUNTER — Other Ambulatory Visit: Payer: Self-pay

## 2019-07-09 DIAGNOSIS — N3281 Overactive bladder: Secondary | ICD-10-CM | POA: Diagnosis not present

## 2019-07-09 NOTE — Patient Instructions (Signed)
Tracking Your Bladder Symptoms    Patient Name:___________________________________________________   Sample: Day   Daytime Voids  Nighttime Voids Urgency for the Day(0-4) Number of Accidents Beverage Comments  Monday IIII II 2 I Water IIII Coffee  I      Week Starting:____________________________________   Day Daytime  Voids Nighttime  Voids Urgency for  The Day(0-4) Number of Accidents Beverages Comments                                                           This week my symptoms were:  O much better  O better O the same O worse   

## 2019-07-09 NOTE — Progress Notes (Signed)
PTNS  Session # 3  Health & Social Factors: No change Caffeine: 0 Alcohol: 0 Daytime voids #per day: 7-8 Night-time voids #per night: 2-3 Urgency: Strong Incontinence Episodes #per day: 0-1 Ankle used: Left Treatment Setting: 12 Feeling/ Response: Toe Flex & Sensory Comments: Patient tolerated well, no complications were noted.   Preformed By: Bradly Bienenstock, CMA   Follow Up: RTC as scheduled in 1 week

## 2019-07-16 ENCOUNTER — Other Ambulatory Visit: Payer: Self-pay

## 2019-07-16 ENCOUNTER — Ambulatory Visit (INDEPENDENT_AMBULATORY_CARE_PROVIDER_SITE_OTHER): Payer: Medicare HMO

## 2019-07-16 DIAGNOSIS — N3281 Overactive bladder: Secondary | ICD-10-CM

## 2019-07-16 NOTE — Progress Notes (Signed)
PTNS  Session # 4  Health & Social Factors: 4 Caffeine: 1 Alcohol: 0 Daytime voids #per day: 7-8 Night-time voids #per night: 2-3 Urgency: Strong Incontinence Episodes #per day: 0 Ankle used: Right Treatment Setting: 13 Feeling/ Response: Toe Flex & Sensory  Comments: Pt notes that he only had 2 incontinence episodes last week total, states that this is a good improvement  Preformed By: Gordy Clement, CMA   Follow Up: RTC in 1 week as scheduled

## 2019-07-23 ENCOUNTER — Other Ambulatory Visit: Payer: Self-pay

## 2019-07-23 ENCOUNTER — Ambulatory Visit (INDEPENDENT_AMBULATORY_CARE_PROVIDER_SITE_OTHER): Payer: Medicare HMO

## 2019-07-23 DIAGNOSIS — N3281 Overactive bladder: Secondary | ICD-10-CM

## 2019-07-23 NOTE — Progress Notes (Signed)
PTNS  Session # 5  Health & Social Factors: N/A  Caffeine: 1 Alcohol: 0 Daytime voids #per day: 7-8 Night-time voids #per night: 3 Urgency: Strong Incontinence Episodes #per day: 0 Ankle used: Left Treatment Setting: 16 Feeling/ Response: Sensory Comments: Bilateral ankle/leg edema   Preformed By: Gordy Clement, CMA   Follow Up:RTC in 1 week

## 2019-07-31 ENCOUNTER — Ambulatory Visit (INDEPENDENT_AMBULATORY_CARE_PROVIDER_SITE_OTHER): Payer: Medicare HMO

## 2019-07-31 ENCOUNTER — Other Ambulatory Visit: Payer: Self-pay

## 2019-07-31 ENCOUNTER — Ambulatory Visit (INDEPENDENT_AMBULATORY_CARE_PROVIDER_SITE_OTHER): Payer: Medicare HMO | Admitting: *Deleted

## 2019-07-31 ENCOUNTER — Other Ambulatory Visit (INDEPENDENT_AMBULATORY_CARE_PROVIDER_SITE_OTHER): Payer: Self-pay | Admitting: Internal Medicine

## 2019-07-31 DIAGNOSIS — I872 Venous insufficiency (chronic) (peripheral): Secondary | ICD-10-CM

## 2019-07-31 DIAGNOSIS — N3281 Overactive bladder: Secondary | ICD-10-CM

## 2019-07-31 NOTE — Progress Notes (Signed)
PTNS  Session #6  Health & Social Factors: N/A   Caffeine: 1 Alcohol: 0 Daytime voids #per day: 7-8 Night-time voids #per night: 3 Urgency: Strong Incontinence Episodes #per day: 0 Ankle used: right Treatment Setting: 13 Feeling/ Response: Sensory Comments: Bilateral ankle/leg edema   Preformed By: Gaspar Cola  CMA   Follow Up:RTC in 1 week

## 2019-08-02 ENCOUNTER — Ambulatory Visit (INDEPENDENT_AMBULATORY_CARE_PROVIDER_SITE_OTHER): Payer: Medicare HMO

## 2019-08-02 ENCOUNTER — Other Ambulatory Visit: Payer: Self-pay

## 2019-08-02 DIAGNOSIS — I872 Venous insufficiency (chronic) (peripheral): Secondary | ICD-10-CM | POA: Diagnosis not present

## 2019-08-06 ENCOUNTER — Ambulatory Visit (INDEPENDENT_AMBULATORY_CARE_PROVIDER_SITE_OTHER): Payer: Medicare HMO

## 2019-08-06 ENCOUNTER — Other Ambulatory Visit: Payer: Self-pay

## 2019-08-06 DIAGNOSIS — N3281 Overactive bladder: Secondary | ICD-10-CM

## 2019-08-06 NOTE — Progress Notes (Signed)
PTNS  Session # 7  Health & Social Factors: No change Caffeine: 1 Alcohol: 0 Daytime voids #per day: 6-7 Night-time voids #per night: 2-3 Urgency: Strong Incontinence Episodes #per day: 0 Ankle used: Left Treatment Setting: 15 Feeling/ Response: Toe flex Comments: Patient tolerated well, no complications noted.   Preformed By: Bradly Bienenstock, CMA   Follow Up: RTC in 1 week for session 8

## 2019-08-06 NOTE — Patient Instructions (Signed)
Tracking Your Bladder Symptoms    Patient Name:___________________________________________________   Sample: Day   Daytime Voids  Nighttime Voids Urgency for the Day(0-4) Number of Accidents Beverage Comments  Monday IIII II 2 I Water IIII Coffee  I      Week Starting:____________________________________   Day Daytime  Voids Nighttime  Voids Urgency for  The Day(0-4) Number of Accidents Beverages Comments                                                           This week my symptoms were:  O much better  O better O the same O worse   

## 2019-08-13 ENCOUNTER — Ambulatory Visit (INDEPENDENT_AMBULATORY_CARE_PROVIDER_SITE_OTHER): Payer: Medicare HMO

## 2019-08-13 ENCOUNTER — Other Ambulatory Visit: Payer: Self-pay

## 2019-08-13 DIAGNOSIS — N3281 Overactive bladder: Secondary | ICD-10-CM | POA: Diagnosis not present

## 2019-08-13 NOTE — Progress Notes (Signed)
PTNS  Session # 8  Health & Social Factors: no change Caffeine: 0-1 Alcohol: 0 Daytime voids #per day: 6 Night-time voids #per night: 1 Urgency: moderate Incontinence Episodes #per day: 0 Ankle used: left Treatment Setting: 12 Feeling/ Response: both Comments: patient tolerated well, no complications  Preformed By: Fonnie Jarvis, CMA    Follow Up: 1 week

## 2019-08-13 NOTE — Patient Instructions (Signed)
Tracking Your Bladder Symptoms    Patient Name:___________________________________________________   Sample: Day   Daytime Voids  Nighttime Voids Urgency for the Day(0-4) Number of Accidents Beverage Comments  Monday IIII II 2 I Water IIII Coffee  I      Week Starting:____________________________________   Day Daytime  Voids Nighttime  Voids Urgency for  The Day(0-4) Number of Accidents Beverages Comments                                                           This week my symptoms were:  O much better  O better O the same O worse   

## 2019-08-20 ENCOUNTER — Ambulatory Visit (INDEPENDENT_AMBULATORY_CARE_PROVIDER_SITE_OTHER): Payer: Medicare HMO

## 2019-08-20 ENCOUNTER — Other Ambulatory Visit: Payer: Self-pay

## 2019-08-20 DIAGNOSIS — N3281 Overactive bladder: Secondary | ICD-10-CM | POA: Diagnosis not present

## 2019-08-20 NOTE — Progress Notes (Signed)
PTNS  Session # 9  Health & Social Factors: No Change Caffeine: 0-1 Alcohol: 0 Daytime voids #per day: 6 Night-time voids #per night: 1 Urgency:Mild Incontinence Episodes #per day: 0 Ankle used: Right Treatment Setting: 17 Feeling/ Response: Sensory & Toe Flex Comments: N/A  Preformed By: Gordy Clement, CMA   Follow Up: RTC in 1 week

## 2019-08-27 ENCOUNTER — Other Ambulatory Visit: Payer: Self-pay

## 2019-08-27 ENCOUNTER — Ambulatory Visit (INDEPENDENT_AMBULATORY_CARE_PROVIDER_SITE_OTHER): Payer: Medicare HMO

## 2019-08-27 DIAGNOSIS — N3281 Overactive bladder: Secondary | ICD-10-CM

## 2019-08-27 NOTE — Patient Instructions (Signed)
Tracking Your Bladder Symptoms    Patient Name:___________________________________________________   Sample: Day   Daytime Voids  Nighttime Voids Urgency for the Day(0-4) Number of Accidents Beverage Comments  Monday IIII II 2 I Water IIII Coffee  I      Week Starting:____________________________________   Day Daytime  Voids Nighttime  Voids Urgency for  The Day(0-4) Number of Accidents Beverages Comments                                                           This week my symptoms were:  O much better  O better O the same O worse   

## 2019-08-27 NOTE — Progress Notes (Signed)
PTNS  Session # 10  Health & Social Factors: no change Caffeine: 0-1 Alcohol: 0 Daytime voids #per day: 7 Night-time voids #per night: 2 Urgency: mild Incontinence Episodes #per day: 2 Ankle used: left Treatment Setting: 13 Feeling/ Response: both Comments: patient tolerated well  Preformed By: Fonnie Jarvis, CMA   Follow Up: next week

## 2019-09-01 NOTE — Progress Notes (Signed)
   09/04/2019  CC:  Chief Complaint  Patient presents with  . Cysto    HPI: [name] is a 74 y.o. male with a hx of bladder cancer returns today for a surveillance cysto.   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  Underwent PTNS 8 treatments. Last appt on 08/27/19.   Most recent PSA was 0.62 in as of 08/2018.  He called in for prescription for Myrbetriq 25 mg and was given additional samples.  This helps with his symptoms.  He does not believe he is taking this now.  Blood pressure 128/66, pulse 73, height 5\' 7"  (1.702 m), weight 233 lb (105.7 kg).   NED. A&Ox3.   No respiratory distress   Abd soft, NT, ND Normal phallus with bilateral descended testicles  Cystoscopy Procedure Note  Patient identification was confirmed, informed consent was obtained, and patient was prepped using Betadine solution.  Lidocaine jelly was administered per urethral meatus.     Pre-Procedure: - Inspection reveals a normal caliber ureteral meatus.  Procedure: The flexible cystoscope was introduced without difficulty - No urethral strictures/lesions are present. - Normal prostate  - Normal bladder neck - Bilateral ureteral orifices identified - Bladder mucosa  reveals no ulcers, tumors, or lesions - No bladder stones - Moderate trabeculation with saccules  Retroflexion shows unremarkable   Post-Procedure: - Patient tolerated the procedure well    Assessment/ Plan:  1. OAB (overactive bladder) Patient has failed 2 treatments of Botox Myrbetriq was found to be cost prohibitive, but he did feel the samples of Myrbetriq 25 mg helped Prescription sent to pharmacy, will continue to help supply patient as possible with samples Underwent PTNS 8 treatments Follow-[facility] with [facility] following PTNS to assess whether or not he would benefit from maintenance.  2. History of bladder  cancer NED Shared decision making with patient regarding whether or not to continue surveillance Ultimately he would like to continue annual surveillance at this time   Follow up in 1 year for cysto.  I, Selena Batten, am acting as a scribe for Dr. [name],  I have reviewed the above documentation for accuracy and completeness, and I agree with the above.   [name], MD

## 2019-09-04 ENCOUNTER — Ambulatory Visit: Payer: Self-pay

## 2019-09-04 ENCOUNTER — Other Ambulatory Visit: Payer: Self-pay

## 2019-09-04 ENCOUNTER — Encounter: Payer: Self-pay | Admitting: Urology

## 2019-09-04 ENCOUNTER — Ambulatory Visit: Payer: Medicare HMO | Admitting: Urology

## 2019-09-04 VITALS — BP 128/66 | HR 73 | Ht 67.0 in | Wt 233.0 lb

## 2019-09-04 DIAGNOSIS — N3281 Overactive bladder: Secondary | ICD-10-CM

## 2019-09-04 DIAGNOSIS — Z8551 Personal history of malignant neoplasm of bladder: Secondary | ICD-10-CM

## 2019-09-04 MED ORDER — MIRABEGRON ER 25 MG PO TB24: 25.0000 mg | ORAL_TABLET | Freq: Every day | ORAL | 6 refills | Status: DC

## 2019-09-04 NOTE — Progress Notes (Signed)
PTNS  Session # 11  Health & Social Factors: no change Caffeine: 0-1 Alcohol: 0 Daytime voids #per day: 7 Night-time voids #per night: 2 Urgency: mild Incontinence Episodes #per day: 2 Ankle used: left Treatment Setting: 13 Feeling/ Response: both Comments: patient tolerated well, he states he has not been taking myrbetriq there was no refill so he did not know he was to continue. Refill was sent and he will restart  Preformed By: Fonnie Jarvis, CMA   Follow Up: 1wk

## 2019-09-04 NOTE — Patient Instructions (Signed)
Tracking Your Bladder Symptoms    Patient Name:___________________________________________________   Sample: Day   Daytime Voids  Nighttime Voids Urgency for the Day(0-4) Number of Accidents Beverage Comments  Monday IIII II 2 I Water IIII Coffee  I      Week Starting:____________________________________   Day Daytime  Voids Nighttime  Voids Urgency for  The Day(0-4) Number of Accidents Beverages Comments                                                           This week my symptoms were:  O much better  O better O the same O worse   

## 2019-09-05 LAB — MICROSCOPIC EXAMINATION: Bacteria, UA: NONE SEEN

## 2019-09-05 LAB — URINALYSIS, COMPLETE
Bilirubin, UA: NEGATIVE
Glucose, UA: NEGATIVE
Ketones, UA: NEGATIVE
Leukocytes,UA: NEGATIVE
Nitrite, UA: NEGATIVE
Protein,UA: NEGATIVE
RBC, UA: NEGATIVE
Specific Gravity, UA: 1.02 (ref 1.005–1.030)
Urobilinogen, Ur: 0.2 mg/dL (ref 0.2–1.0)
pH, UA: 5 (ref 5.0–7.5)

## 2019-09-10 ENCOUNTER — Other Ambulatory Visit: Payer: Self-pay

## 2019-09-10 ENCOUNTER — Ambulatory Visit: Payer: Self-pay

## 2019-09-10 ENCOUNTER — Ambulatory Visit (INDEPENDENT_AMBULATORY_CARE_PROVIDER_SITE_OTHER): Payer: Medicare HMO

## 2019-09-10 DIAGNOSIS — N3281 Overactive bladder: Secondary | ICD-10-CM

## 2019-09-10 NOTE — Progress Notes (Signed)
PTNS  Session # 12  Health & Social Factors: no change Caffeine: 0-1 Alcohol: 0 Daytime voids #per day: 6-7 Night-time voids #per night: 2 Urgency: mild Incontinence Episodes #per day: 2 Ankle used: right Treatment Setting: 19 Feeling/ Response: both Comments: patient tolerated well. Patient states over the weekend he had a real back incontinence episode  Preformed By: Kerman Passey, RMA.   Follow Up: 1 month, post PTNS follow up

## 2019-10-09 ENCOUNTER — Ambulatory Visit: Payer: Self-pay | Admitting: Urology

## 2019-10-10 ENCOUNTER — Ambulatory Visit: Payer: Self-pay | Admitting: Urology

## 2019-10-10 NOTE — Progress Notes (Signed)
10/11/2019 4:07 PM   [name] 26-Jan-1946 [id number]  Referring provider: Ricardo Jericho, NP [address] [place],  [place] [address]  Chief Complaint  Patient presents with  . Follow-up    HPI: [name] is a 74 year old male with refractory OAB, bilateral hydroceles and bladder cancer who presents today for follow up.  Refractory OAB He went underwent his first Botox injection in 11/2017 and experienced great results.  His second injection was in 08/2018 and he states it never gave him control over his symptoms.  He is experiencing urgency, incontinence and intermittency.  He is concerned that he experienced results almost immediately after his first Botox injection. His urine culture grew out corynebacterium glucuronolyticum.  He was placed on PCN.  He states the antibiotic did not make a difference whatsoever.  He underwent a second Botox injection on 03/21/2019 with Dr. Erlene Quan.  He states the second Botox injection was not helpful.  He had a trial of Myrbetriq samples, but the prescription is still cost prohibitive.  He has completed twelve weeks of PTNS at this time.    He did not see any change in his urinary symptoms with the PTNS.  He still is experiencing frequency, urgency, incontinence and a weak urinary stream.   Patient denies any modifying or aggravating factors.  Patient denies any gross hematuria, dysuria or suprapubic/flank pain.  Patient denies any fevers, chills, nausea or vomiting.   Bilateral hydroceles Unchanged.  Not bothersome.  Bladder cancer Surveillance cystoscopy in 08/2019 was NED.     PMH: Past Medical History:  Diagnosis Date  . Anxiety   . Arteriosclerosis of coronary artery 11/16/2011   Overview:  Stent 10/2011 stent rca 2015 with collaterals to lad which is chronically occluded   . Benign enlargement of prostate   . Benign essential HTN 06/11/2014  . Benign prostatic hypertrophy without urinary obstruction 07/31/2014  . Bilateral  cataracts 05/16/2013   Overview:  [facility]    . Bone spur of foot    Left  . BP (high blood pressure) 11/09/2012  . Cancer ([place])    skin (forehead) and bladder  . Carotid artery narrowing 02/08/2014  . Depression   . Detrusor hypertrophy   . Diabetes ([place])   . Diabetes mellitus, type 2 ([place]) 12/12/2012  . Diverticulosis   . Dyspnea   . Esophageal reflux   . Esophageal reflux   . Fothergill's neuralgia 08/07/2012   Overview:  [facility]   . Gastritis   . GERD (gastroesophageal reflux disease)   . Headache    cluster headaches  . 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 Hearing loss in left ear   . Heart disease   . Hematuria   . Hemorrhoids   . History of hiatal hernia 12/14/2017   small   . Hypercholesteremia   . Lesion of bladder   . Myocardial infarct ([place])   . Presence of stent in coronary artery 11/09/2012  . Rectal bleeding   . Trigeminal neuralgia   . Trigeminal neuralgia   . Valvular heart disease   . Vitamin D deficiency     Surgical History: Past Surgical History:  Procedure Laterality Date  . APPENDECTOMY    . BOTOX INJECTION N/A 12/21/2017   Procedure: Bladder BOTOX INJECTION;  Surgeon: [name], MD;  Location: [facility] ORS;  Service: Urology;  Laterality: N/A;  . BOTOX INJECTION N/A 09/11/2018   Procedure: Bladder BOTOX INJECTION;  Surgeon: [name], MD;  Location: [facility] ORS;  Service: Urology;  Laterality: N/A;  . CARDIAC CATHETERIZATION    . CARDIAC CATHETERIZATION N/A 01/22/2015   Procedure: Left Heart Cath;  Surgeon: Corey Skains, MD;  Location: [place] CV LAB;  Service: Cardiovascular;  Laterality: N/A;  . CARDIAC CATHETERIZATION N/A 01/22/2015   Procedure: Coronary Stent Intervention;  Surgeon: Isaias Cowman, MD;  Location: [place] CV LAB;  Service: Cardiovascular;  Laterality: N/A;  . CATARACT EXTRACTION, BILATERAL    . COLONOSCOPY WITH PROPOFOL N/A 12/08/2015   Procedure: COLONOSCOPY WITH PROPOFOL;   Surgeon: Lollie Sails, MD;  Location: [facility] ENDOSCOPY;  Service: Endoscopy;  Laterality: N/A;  . COLONOSCOPY WITH PROPOFOL N/A 12/09/2015   Procedure: COLONOSCOPY WITH PROPOFOL;  Surgeon: Lollie Sails, MD;  Location: [facility] ENDOSCOPY;  Service: Endoscopy;  Laterality: N/A;  . CORONARY ANGIOPLASTY     5 stents  . CORONARY STENT PLACEMENT  2015   x5  . CYSTOSCOPY N/A 09/11/2018   Procedure: CYSTOSCOPY;  Surgeon: [name], MD;  Location: [facility] ORS;  Service: Urology;  Laterality: N/A;  . CYSTOSCOPY WITH BIOPSY N/A 12/21/2017   Procedure: CYSTOSCOPY WITH BIOPSY;  Surgeon: [name], MD;  Location: [facility] ORS;  Service: Urology;  Laterality: N/A;  . ESOPHAGOGASTRODUODENOSCOPY (EGD) WITH PROPOFOL N/A 12/08/2015   Procedure: ESOPHAGOGASTRODUODENOSCOPY (EGD) WITH PROPOFOL;  Surgeon: Lollie Sails, MD;  Location: [facility] ENDOSCOPY;  Service: Endoscopy;  Laterality: N/A;  . ESOPHAGOGASTRODUODENOSCOPY (EGD) WITH PROPOFOL N/A 12/13/2017   Procedure: ESOPHAGOGASTRODUODENOSCOPY (EGD) WITH PROPOFOL;  Surgeon: Lollie Sails, MD;  Location: [facility] ENDOSCOPY;  Service: Endoscopy;  Laterality: N/A;  . EYE SURGERY    . HERNIA REPAIR    . kidney tumor remove    . TRANSURETHRAL RESECTION OF BLADDER TUMOR WITH GYRUS (TURBT-GYRUS)  12/2013  . UMBILICAL HERNIA REPAIR    . urethral meatotomy      Home Medications:  Allergies as of 10/11/2019      Reactions   Ace Inhibitors Cough   Carbamazepine Rash   Other reaction(s): RASH Other reaction(s): RASH   Lyrica [pregabalin] Rash      Medication List       Accurate as of October 11, 2019 11:59 PM. If you have any questions, ask your nurse or doctor.        aspirin 81 MG tablet Take 81 mg by mouth daily.   citalopram 20 MG tablet Commonly known as: CELEXA Take 20 mg by mouth at bedtime.   Docusate Sodium 100 MG capsule Take by mouth.   furosemide 20 MG tablet Commonly known as: LASIX Take by mouth.   gabapentin 100 MG  capsule Commonly known as: NEURONTIN Take 100 mg by mouth 3 (three) times daily.   gabapentin 300 MG capsule Commonly known as: NEURONTIN Take 1,200 mg by mouth 2 (two) times daily.   Gemtesa 75 MG Tabs Generic drug: Vibegron Take 75 mg by mouth daily. Started by: Zara Council, PA-C   hydrocortisone 2.5 % ointment APPLY TOPICALLY 2 TIMES A DAY. FOR SCALING OF EARS AND FACE AS NEEDED. AVOID USE ON ROSACEA   isosorbide mononitrate 30 MG 24 hr tablet Commonly known as: IMDUR Take 60 mg by mouth daily.   lactulose 10 GM/15ML solution Commonly known as: CHRONULAC Take by mouth.   mirabegron ER 25 MG Tb24 tablet Commonly known as: MYRBETRIQ Take 1 tablet (25 mg total) by mouth daily.   multivitamins with iron Tabs tablet Take 1 tablet by mouth daily.   nitroGLYCERIN 0.4 MG SL tablet  Commonly known as: NITROSTAT Place under the tongue.   Omega-3 1000 MG Caps Take 1 g by mouth daily. Reported on 03/21/2015   ONE TOUCH ULTRA TEST test strip Generic drug: glucose blood USE 3 (THREE) TIMES DAILY USE AS INSTRUCTED.- ONE TOUCH ULTRA BLOOD GLUCOSE METER STRIPS   pantoprazole 40 MG tablet Commonly known as: PROTONIX Take 40 mg by mouth daily.   penicillin v potassium 500 MG tablet Commonly known as: VEETID Take 1 tablet (500 mg total) by mouth 3 (three) times daily.   pramipexole 1 MG tablet Commonly known as: MIRAPEX Take 1 mg by mouth daily.   pravastatin 80 MG tablet Commonly known as: PRAVACHOL TAKE 1 TABLET (80 MG TOTAL) BY MOUTH NIGHTLY.   RA Blood Glucose Monitor Devi by Does not apply route.   sildenafil 50 MG tablet Commonly known as: VIAGRA Take 50 mg by mouth daily as needed for erectile dysfunction.   terbinafine 250 MG tablet Commonly known as: LAMISIL Take by mouth.   Trileptal 150 MG tablet Generic drug: OXcarbazepine Take 150 mg by mouth at bedtime.   valsartan 40 MG tablet Commonly known as: DIOVAN Take 40 mg by mouth daily.   Vitamin  D-1000 Max St 25 MCG (1000 UT) tablet Generic drug: Cholecalciferol Take 1,000 Units by mouth daily.       Allergies:  Allergies  Allergen Reactions  . Ace Inhibitors Cough  . Carbamazepine Rash    Other reaction(s): RASH Other reaction(s): RASH   . Lyrica [Pregabalin] Rash    Family History: Family History  Problem Relation Age of Onset  . Kidney cancer Mother   . Prostate cancer Neg Hx     Social History:  reports that he quit smoking about 30 years ago. His smoking use included cigarettes. He has a 80.00 pack-year smoking history. He has never used smokeless tobacco. He reports previous alcohol use. He reports that he does not use drugs.  ROS: For pertinent review of systems please refer to history of present illness  Physical Exam:  Constitutional:  Well nourished. Alert and oriented, No acute distress. HEENT: [place] AT, mask in place.  Trachea midline Cardiovascular: No clubbing, cyanosis, or edema. Respiratory: Normal respiratory effort, no increased work of breathing. Neurologic: Grossly intact, no focal deficits, moving all 4 extremities. Psychiatric: Normal mood and affect.   Laboratory Data: Lab Results  Component Value Date   WBC 7.5 01/23/2015   HGB 13.5 01/23/2015   HCT 40.2 01/23/2015   MCV 76.8 (L) 01/23/2015   PLT 164 01/23/2015    Lab Results  Component Value Date   CREATININE 0.98 01/23/2015    Lab Results  Component Value Date   HGBA1C 6.8 (H) 05/22/2012    Lab Results  Component Value Date   AST 21 05/23/2012   Lab Results  Component Value Date   ALT 20 05/23/2012   I have reviewed the labs.   Pertinent Imaging: No recent imaging   Assessment & Plan:    1. OAB (overactive bladder) Patient has failed 2 treatments of Botox Patient is not a candidate for anticholinergics due to memory issues Failed Myrbetriq Failed PTNS Will have a trial of Gemtesa 75 mg, # 28 samples given Patient will call with his experience with the  [place]  2.  Bilateral hydroceles Stable  3. History of bladder cancer Scheduled for cystoscopy with Dr. Erlene Quan on 09/09/2020  Return for he will call to let me know if the [place] worked .  These notes generated  with voice recognition software. I apologize for typographical errors.  Zara Council, PA-C  [place] [address]  [place] [place], [place] [address] [phone]  I spent 15 minutes on the day of the encounter to include pre-visit record review, face-to-face time with the patient, and post-visit ordering of tests.

## 2019-10-11 ENCOUNTER — Ambulatory Visit: Payer: Medicare HMO | Admitting: Urology

## 2019-10-11 ENCOUNTER — Other Ambulatory Visit: Payer: Self-pay

## 2019-10-11 DIAGNOSIS — N433 Hydrocele, unspecified: Secondary | ICD-10-CM | POA: Diagnosis not present

## 2019-10-11 DIAGNOSIS — Z8551 Personal history of malignant neoplasm of bladder: Secondary | ICD-10-CM

## 2019-10-11 DIAGNOSIS — N3281 Overactive bladder: Secondary | ICD-10-CM

## 2019-10-11 MED ORDER — GEMTESA 75 MG PO TABS: 75.0000 mg | ORAL_TABLET | Freq: Every day | ORAL | 0 refills | Status: DC

## 2019-10-14 ENCOUNTER — Encounter: Payer: Self-pay | Admitting: Urology

## 2019-11-19 NOTE — Progress Notes (Deleted)
11/20/2019 1:25 PM   [name] 05/10/1945 [id number]  Referring provider: Ricardo Jericho, NP [address] [place],  [place] [address]  No chief complaint on file.   HPI: [name] is a 74 year old male with refractory OAB, bilateral hydroceles and bladder cancer who presents today for follow up.  Refractory OAB He went underwent his first Botox injection in 11/2017 and experienced great results.  His second injection was in 08/2018 and he states it never gave him control over his symptoms.  He is experiencing urgency, incontinence and intermittency.  He is concerned that he experienced results almost immediately after his first Botox injection where as he second injection did not provide any relief.  His urine culture grew out corynebacterium glucuronolyticum.  He was placed on PCN.  He states the antibiotic did not make a difference whatsoever.  He underwent a third Botox injection on 03/21/2019 with Dr. Erlene Quan.  He states the second Botox injection was not helpful.  He had a trial of Myrbetriq samples, but the prescription is still cost prohibitive.  He has completed twelve weeks of PTNS at this time without relief.    He was given Gemtesa samples at his last appointment.  Bilateral hydroceles Unchanged.  Not bothersome.  Bladder cancer Surveillance cystoscopy in 08/2019 was NED.     PMH: Past Medical History:  Diagnosis Date  . Anxiety   . Arteriosclerosis of coronary artery 11/16/2011   Overview:  Stent 10/2011 stent rca 2015 with collaterals to lad which is chronically occluded   . Benign enlargement of prostate   . Benign essential HTN 06/11/2014  . Benign prostatic hypertrophy without urinary obstruction 07/31/2014  . Bilateral cataracts 05/16/2013   Overview:  [facility]    . Bone spur of foot    Left  . BP (high blood pressure) 11/09/2012  . Cancer ([place])    skin (forehead) and bladder  . Carotid artery narrowing 02/08/2014  . Depression     . Detrusor hypertrophy   . Diabetes ([place])   . Diabetes mellitus, type 2 ([place]) 12/12/2012  . Diverticulosis   . Dyspnea   . Esophageal reflux   . Esophageal reflux   . Fothergill's neuralgia 08/07/2012   Overview:  [facility]   . Gastritis   . GERD (gastroesophageal reflux disease)   . Headache    cluster headaches  . 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 Hearing loss in left ear   . Heart disease   . Hematuria   . Hemorrhoids   . History of hiatal hernia 12/14/2017   small   . Hypercholesteremia   . Lesion of bladder   . Myocardial infarct ([place])   . Presence of stent in coronary artery 11/09/2012  . Rectal bleeding   . Trigeminal neuralgia   . Trigeminal neuralgia   . Valvular heart disease   . Vitamin D deficiency     Surgical History: Past Surgical History:  Procedure Laterality Date  . APPENDECTOMY    . BOTOX INJECTION N/A 12/21/2017   Procedure: Bladder BOTOX INJECTION;  Surgeon: [name], MD;  Location: [facility] ORS;  Service: Urology;  Laterality: N/A;  . BOTOX INJECTION N/A 09/11/2018   Procedure: Bladder BOTOX INJECTION;  Surgeon: [name], MD;  Location: [facility] ORS;  Service: Urology;  Laterality: N/A;  . CARDIAC CATHETERIZATION    . CARDIAC CATHETERIZATION N/A 01/22/2015   Procedure: Left Heart Cath;  Surgeon: Corey Skains, MD;  Location: [facility]  INVASIVE CV LAB;  Service: Cardiovascular;  Laterality: N/A;  . CARDIAC CATHETERIZATION N/A 01/22/2015   Procedure: Coronary Stent Intervention;  Surgeon: Isaias Cowman, MD;  Location: [place] CV LAB;  Service: Cardiovascular;  Laterality: N/A;  . CATARACT EXTRACTION, BILATERAL    . COLONOSCOPY WITH PROPOFOL N/A 12/08/2015   Procedure: COLONOSCOPY WITH PROPOFOL;  Surgeon: Lollie Sails, MD;  Location: [facility] ENDOSCOPY;  Service: Endoscopy;  Laterality: N/A;  . COLONOSCOPY WITH PROPOFOL N/A 12/09/2015   Procedure: COLONOSCOPY WITH PROPOFOL;  Surgeon: Lollie Sails, MD;  Location: [facility]  ENDOSCOPY;  Service: Endoscopy;  Laterality: N/A;  . CORONARY ANGIOPLASTY     5 stents  . CORONARY STENT PLACEMENT  2015   x5  . CYSTOSCOPY N/A 09/11/2018   Procedure: CYSTOSCOPY;  Surgeon: [name], MD;  Location: [facility] ORS;  Service: Urology;  Laterality: N/A;  . CYSTOSCOPY WITH BIOPSY N/A 12/21/2017   Procedure: CYSTOSCOPY WITH BIOPSY;  Surgeon: [name], MD;  Location: [facility] ORS;  Service: Urology;  Laterality: N/A;  . ESOPHAGOGASTRODUODENOSCOPY (EGD) WITH PROPOFOL N/A 12/08/2015   Procedure: ESOPHAGOGASTRODUODENOSCOPY (EGD) WITH PROPOFOL;  Surgeon: Lollie Sails, MD;  Location: [facility] ENDOSCOPY;  Service: Endoscopy;  Laterality: N/A;  . ESOPHAGOGASTRODUODENOSCOPY (EGD) WITH PROPOFOL N/A 12/13/2017   Procedure: ESOPHAGOGASTRODUODENOSCOPY (EGD) WITH PROPOFOL;  Surgeon: Lollie Sails, MD;  Location: [facility] ENDOSCOPY;  Service: Endoscopy;  Laterality: N/A;  . EYE SURGERY    . HERNIA REPAIR    . kidney tumor remove    . TRANSURETHRAL RESECTION OF BLADDER TUMOR WITH GYRUS (TURBT-GYRUS)  12/2013  . UMBILICAL HERNIA REPAIR    . urethral meatotomy      Home Medications:  Allergies as of 11/20/2019      Reactions   Ace Inhibitors Cough   Carbamazepine Rash   Other reaction(s): RASH Other reaction(s): RASH   Lyrica [pregabalin] Rash      Medication List       Accurate as of November 19, 2019  1:25 PM. If you have any questions, ask your nurse or doctor.        aspirin 81 MG tablet Take 81 mg by mouth daily.   citalopram 20 MG tablet Commonly known as: CELEXA Take 20 mg by mouth at bedtime.   Docusate Sodium 100 MG capsule Take by mouth.   furosemide 20 MG tablet Commonly known as: LASIX Take by mouth.   gabapentin 100 MG capsule Commonly known as: NEURONTIN Take 100 mg by mouth 3 (three) times daily.   gabapentin 300 MG capsule Commonly known as: NEURONTIN Take 1,200 mg by mouth 2 (two) times daily.   Gemtesa 75 MG Tabs Generic drug:  Vibegron Take 75 mg by mouth daily.   hydrocortisone 2.5 % ointment APPLY TOPICALLY 2 TIMES A DAY. FOR SCALING OF EARS AND FACE AS NEEDED. AVOID USE ON ROSACEA   isosorbide mononitrate 30 MG 24 hr tablet Commonly known as: IMDUR Take 60 mg by mouth daily.   mirabegron ER 25 MG Tb24 tablet Commonly known as: MYRBETRIQ Take 1 tablet (25 mg total) by mouth daily.   multivitamins with iron Tabs tablet Take 1 tablet by mouth daily.   nitroGLYCERIN 0.4 MG SL tablet Commonly known as: NITROSTAT Place under the tongue.   Omega-3 1000 MG Caps Take 1 g by mouth daily. Reported on 03/21/2015   ONE TOUCH ULTRA TEST test strip Generic drug: glucose blood USE 3 (THREE) TIMES DAILY USE AS INSTRUCTED.- ONE TOUCH ULTRA BLOOD GLUCOSE METER STRIPS   pantoprazole  40 MG tablet Commonly known as: PROTONIX Take 40 mg by mouth daily.   penicillin v potassium 500 MG tablet Commonly known as: VEETID Take 1 tablet (500 mg total) by mouth 3 (three) times daily.   pramipexole 1 MG tablet Commonly known as: MIRAPEX Take 1 mg by mouth daily.   pravastatin 80 MG tablet Commonly known as: PRAVACHOL TAKE 1 TABLET (80 MG TOTAL) BY MOUTH NIGHTLY.   RA Blood Glucose Monitor Devi by Does not apply route.   sildenafil 50 MG tablet Commonly known as: VIAGRA Take 50 mg by mouth daily as needed for erectile dysfunction.   terbinafine 250 MG tablet Commonly known as: LAMISIL Take by mouth.   Trileptal 150 MG tablet Generic drug: OXcarbazepine Take 150 mg by mouth at bedtime.   valsartan 40 MG tablet Commonly known as: DIOVAN Take 40 mg by mouth daily.   Vitamin D-1000 Max St 25 MCG (1000 UT) tablet Generic drug: Cholecalciferol Take 1,000 Units by mouth daily.       Allergies:  Allergies  Allergen Reactions  . Ace Inhibitors Cough  . Carbamazepine Rash    Other reaction(s): RASH Other reaction(s): RASH   . Lyrica [Pregabalin] Rash    Family History: Family History  Problem  Relation Age of Onset  . Kidney cancer Mother   . Prostate cancer Neg Hx     Social History:  reports that he quit smoking about 30 years ago. His smoking use included cigarettes. He has a 80.00 pack-year smoking history. He has never used smokeless tobacco. He reports previous alcohol use. He reports that he does not use drugs.  ROS: For pertinent review of systems please refer to history of present illness  Physical Exam:  Constitutional:  Well nourished. Alert and oriented, No acute distress. HEENT: [place] AT, moist mucus membranes.  Trachea midline Cardiovascular: No clubbing, cyanosis, or edema. Respiratory: Normal respiratory effort, no increased work of breathing. GI: Abdomen is soft, non tender, non distended, no abdominal masses. Liver and spleen not palpable.  No hernias appreciated.  Stool sample for occult testing is not indicated.   GU: No CVA tenderness.  No bladder fullness or masses.  Patient with circumcised/uncircumcised phallus. ***Foreskin easily retracted***  Urethral meatus is patent.  No penile discharge. No penile lesions or rashes. Scrotum without lesions, cysts, rashes and/or edema.  Testicles are located scrotally bilaterally. No masses are appreciated in the testicles. Left and right epididymis are normal. Rectal: Patient with  normal sphincter tone. Anus and perineum without scarring or rashes. No rectal masses are appreciated. Prostate is approximately *** grams, *** nodules are appreciated. Seminal vesicles are normal. Skin: No rashes, bruises or suspicious lesions. Lymph: No inguinal adenopathy. Neurologic: Grossly intact, no focal deficits, moving all 4 extremities. Psychiatric: Normal mood and affect.   Laboratory Data: Lab Results  Component Value Date   WBC 7.5 01/23/2015   HGB 13.5 01/23/2015   HCT 40.2 01/23/2015   MCV 76.8 (L) 01/23/2015   PLT 164 01/23/2015    Lab Results  Component Value Date   CREATININE 0.98 01/23/2015    Lab Results   Component Value Date   HGBA1C 6.8 (H) 05/22/2012    Lab Results  Component Value Date   AST 21 05/23/2012   Lab Results  Component Value Date   ALT 20 05/23/2012   I have reviewed the labs.   Pertinent Imaging: No recent imaging   Assessment & Plan:    1. OAB (overactive bladder) Patient has failed  2 treatments of Botox Patient is not a candidate for anticholinergics due to memory issues Failed Myrbetriq Failed PTNS Will have a trial of Gemtesa 75 mg, # 28 samples given Patient will call with his experience with the Gemtesa  2.  Bilateral hydroceles Stable  3. History of bladder cancer Scheduled for cystoscopy with Dr. Erlene Quan on 09/09/2020  No follow-ups on file.  These notes generated with voice recognition software. I apologize for typographical errors.  Zara Council, PA-C  [facility] [address]  [place], [place] [address] [phone]

## 2019-11-20 ENCOUNTER — Encounter: Payer: Self-pay | Admitting: Urology

## 2019-11-20 ENCOUNTER — Ambulatory Visit: Payer: Medicare HMO | Admitting: Urology

## 2019-11-27 NOTE — Progress Notes (Signed)
11/28/2019 8:16 PM   [name] 1945-10-05 [id number]  Referring provider: Ricardo Jericho, NP [address] [place],  [place] [address]  Chief Complaint  Patient presents with  . Over Active Bladder    HPI: [name] is a 74 year old male with refractory OAB, bilateral hydroceles and bladder cancer who presents today for follow up.  Refractory OAB Oxybutynin XL 15 mg -equivocal results, discontinued due to patient's dementia Myrbetriq 25 mg -equivocal results, cost prohibitive Botox injection 11/2017 in OR - at goal Botox injection 08/2018 in OR -lost effectiveness after 3 months Trial of antibiotics 12/2018 -no improvement in symptoms Botox injection 03/2019 in office - ineffective  PTNS 06/25/2019 - 09/10/2019 for 12 weekly treatments - ineffective Gemtesa 75 mg -equivocal results  Patient is prescribed Lasix 20 mg 3 times daily, but he admits he does not adhere to the schedule as it causes him to urinate more frequently.   He is also taking Mirapex which has a side effect of urinary frequency.    IPSS score: 24/6    PVR: 22 mL   Previous PVR: 195 mL  Major complaint(s): Frequency, urgency, incontinence, difficulty urinating, weak urinary stream, back pain and constipation.  Denies any dysuria, hematuria or suprapubic pain.   Currently taking: Myrbetriq 25 mg daily  Today's UA is benign.   IPSS    Row Name 11/28/19 0800         International Prostate Symptom Score   How often have you had the sensation of not emptying your bladder? About half the time     How often have you had to urinate less than every two hours? About half the time     How often have you found you stopped and started again several times when you urinated? Almost always     How often have you found it difficult to postpone urination? About half the time     How often have you had a weak urinary stream? Almost always     How often have you had to strain to start urination? About half  the time     How many times did you typically get up at night to urinate? 2 Times     Total IPSS Score 24       Quality of Life due to urinary symptoms   If you were to spend the rest of your life with your urinary condition just the way it is now how would you feel about that? Terrible            Score:  1-7 Mild 8-19 Moderate 20-35 Severe   Bilateral hydroceles Unchanged.  Not bothersome.  Bladder cancer Surveillance cystoscopy in 08/2019 was NED.     PMH: Past Medical History:  Diagnosis Date  . Anxiety   . Arteriosclerosis of coronary artery 11/16/2011   Overview:  Stent 10/2011 stent rca 2015 with collaterals to lad which is chronically occluded   . Benign enlargement of prostate   . Benign essential HTN 06/11/2014  . Benign prostatic hypertrophy without urinary obstruction 07/31/2014  . Bilateral cataracts 05/16/2013   Overview:  Dr. [facility]    . Bone spur of foot    Left  . BP (high blood pressure) 11/09/2012  . Cancer ([place])    skin (forehead) and bladder  . Carotid artery narrowing 02/08/2014  . Depression   . Detrusor hypertrophy   . Diabetes ([place])   . Diabetes mellitus, type 2 (  [place]) 12/12/2012  . Diverticulosis   . Dyspnea   . Esophageal reflux   . Esophageal reflux   . Fothergill's neuralgia 08/07/2012   Overview:  [facility]   . Gastritis   . GERD (gastroesophageal reflux disease)   . Headache    cluster headaches  . 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 Hearing loss in left ear   . Heart disease   . Hematuria   . Hemorrhoids   . History of hiatal hernia 12/14/2017   small   . Hypercholesteremia   . Lesion of bladder   . Myocardial infarct ([place])   . Presence of stent in coronary artery 11/09/2012  . Rectal bleeding   . Trigeminal neuralgia   . Trigeminal neuralgia   . Valvular heart disease   . Vitamin D deficiency     Surgical History: Past Surgical History:  Procedure Laterality Date  . APPENDECTOMY    . BOTOX INJECTION  N/A 12/21/2017   Procedure: Bladder BOTOX INJECTION;  Surgeon: [name], MD;  Location: [facility] ORS;  Service: Urology;  Laterality: N/A;  . BOTOX INJECTION N/A 09/11/2018   Procedure: Bladder BOTOX INJECTION;  Surgeon: [name], MD;  Location: [facility] ORS;  Service: Urology;  Laterality: N/A;  . CARDIAC CATHETERIZATION    . CARDIAC CATHETERIZATION N/A 01/22/2015   Procedure: Left Heart Cath;  Surgeon: Corey Skains, MD;  Location: [place] CV LAB;  Service: Cardiovascular;  Laterality: N/A;  . CARDIAC CATHETERIZATION N/A 01/22/2015   Procedure: Coronary Stent Intervention;  Surgeon: Isaias Cowman, MD;  Location: [place] CV LAB;  Service: Cardiovascular;  Laterality: N/A;  . CATARACT EXTRACTION, BILATERAL    . COLONOSCOPY WITH PROPOFOL N/A 12/08/2015   Procedure: COLONOSCOPY WITH PROPOFOL;  Surgeon: Lollie Sails, MD;  Location: [facility] ENDOSCOPY;  Service: Endoscopy;  Laterality: N/A;  . COLONOSCOPY WITH PROPOFOL N/A 12/09/2015   Procedure: COLONOSCOPY WITH PROPOFOL;  Surgeon: Lollie Sails, MD;  Location: [facility] ENDOSCOPY;  Service: Endoscopy;  Laterality: N/A;  . CORONARY ANGIOPLASTY     5 stents  . CORONARY STENT PLACEMENT  2015   x5  . CYSTOSCOPY N/A 09/11/2018   Procedure: CYSTOSCOPY;  Surgeon: [name], MD;  Location: [facility] ORS;  Service: Urology;  Laterality: N/A;  . CYSTOSCOPY WITH BIOPSY N/A 12/21/2017   Procedure: CYSTOSCOPY WITH BIOPSY;  Surgeon: [name], MD;  Location: [facility] ORS;  Service: Urology;  Laterality: N/A;  . ESOPHAGOGASTRODUODENOSCOPY (EGD) WITH PROPOFOL N/A 12/08/2015   Procedure: ESOPHAGOGASTRODUODENOSCOPY (EGD) WITH PROPOFOL;  Surgeon: Lollie Sails, MD;  Location: [facility] ENDOSCOPY;  Service: Endoscopy;  Laterality: N/A;  . ESOPHAGOGASTRODUODENOSCOPY (EGD) WITH PROPOFOL N/A 12/13/2017   Procedure: ESOPHAGOGASTRODUODENOSCOPY (EGD) WITH PROPOFOL;  Surgeon: Lollie Sails, MD;  Location: [facility] ENDOSCOPY;  Service:  Endoscopy;  Laterality: N/A;  . EYE SURGERY    . HERNIA REPAIR    . kidney tumor remove    . TRANSURETHRAL RESECTION OF BLADDER TUMOR WITH GYRUS (TURBT-GYRUS)  12/2013  . UMBILICAL HERNIA REPAIR    . urethral meatotomy      Home Medications:  Allergies as of 11/28/2019      Reactions   Ace Inhibitors Cough   Carbamazepine Rash   Other reaction(s): RASH Other reaction(s): RASH   Lyrica [pregabalin] Rash      Medication List       Accurate as of November 28, 2019 11:59 PM. If you have any questions, ask your nurse or doctor.        albuterol 108 (  90 Base) MCG/ACT inhaler Commonly known as: VENTOLIN HFA   aspirin 81 MG tablet Take 81 mg by mouth daily.   citalopram 20 MG tablet Commonly known as: CELEXA Take 20 mg by mouth at bedtime.   Docusate Sodium 100 MG capsule Take by mouth.   furosemide 20 MG tablet Commonly known as: LASIX Take by mouth.   gabapentin 100 MG capsule Commonly known as: NEURONTIN Take 100 mg by mouth 3 (three) times daily.   gabapentin 300 MG capsule Commonly known as: NEURONTIN Take 1,200 mg by mouth 2 (two) times daily.   Gemtesa 75 MG Tabs Generic drug: Vibegron Take 75 mg by mouth daily.   hydrocortisone 2.5 % ointment APPLY TOPICALLY 2 TIMES A DAY. FOR SCALING OF EARS AND FACE AS NEEDED. AVOID USE ON ROSACEA   isosorbide mononitrate 30 MG 24 hr tablet Commonly known as: IMDUR Take 60 mg by mouth daily.   mirabegron ER 25 MG Tb24 tablet Commonly known as: MYRBETRIQ Take 1 tablet (25 mg total) by mouth daily.   multivitamins with iron Tabs tablet Take 1 tablet by mouth daily.   nitroGLYCERIN 0.4 MG SL tablet Commonly known as: NITROSTAT Place under the tongue.   Omega-3 1000 MG Caps Take 1 g by mouth daily. Reported on 03/21/2015   ONE TOUCH ULTRA TEST test strip Generic drug: glucose blood USE 3 (THREE) TIMES DAILY USE AS INSTRUCTED.- ONE TOUCH ULTRA BLOOD GLUCOSE METER STRIPS   pantoprazole 40 MG tablet Commonly  known as: PROTONIX Take 40 mg by mouth daily.   penicillin v potassium 500 MG tablet Commonly known as: VEETID Take 1 tablet (500 mg total) by mouth 3 (three) times daily.   pramipexole 1 MG tablet Commonly known as: MIRAPEX Take 1 mg by mouth daily.   pravastatin 80 MG tablet Commonly known as: PRAVACHOL TAKE 1 TABLET (80 MG TOTAL) BY MOUTH NIGHTLY.   RA Blood Glucose Monitor Devi by Does not apply route.   sildenafil 50 MG tablet Commonly known as: VIAGRA Take 50 mg by mouth daily as needed for erectile dysfunction.   terbinafine 250 MG tablet Commonly known as: LAMISIL Take by mouth.   Trileptal 150 MG tablet Generic drug: OXcarbazepine Take 150 mg by mouth at bedtime.   valsartan 40 MG tablet Commonly known as: DIOVAN Take 40 mg by mouth daily.   Vitamin D-1000 Max St 25 MCG (1000 UT) tablet Generic drug: Cholecalciferol Take 1,000 Units by mouth daily.       Allergies:  Allergies  Allergen Reactions  . Ace Inhibitors Cough  . Carbamazepine Rash    Other reaction(s): RASH Other reaction(s): RASH   . Lyrica [Pregabalin] Rash    Family History: Family History  Problem Relation Age of Onset  . Kidney cancer Mother   . Prostate cancer Neg Hx     Social History:  reports that he quit smoking about 30 years ago. His smoking use included cigarettes. He has a 80.00 pack-year smoking history. He has never used smokeless tobacco. He reports previous alcohol use. He reports that he does not use drugs.  ROS: For pertinent review of systems please refer to history of present illness  Physical Exam:  Blood pressure (!) 161/81, pulse 70, height 5\' 7"  (1.702 m), weight 232 lb (105.2 kg). Constitutional:  Well nourished. Alert and oriented, No acute distress. HEENT: [place] AT, mask in place.  Trachea midline Cardiovascular: No clubbing, cyanosis, or edema. Respiratory: Normal respiratory effort, no increased work of breathing. Neurologic: Grossly  intact, no focal  deficits, moving all 4 extremities. Psychiatric: Normal mood and affect.   Laboratory Data: Urinalysis Component     Latest Ref Rng & Units 11/28/2019  WBC, UA     0 - 5 /hpf 0-5  RBC     0 - 2 /hpf 0-2  Epithelial Cells (non renal)     0 - 10 /hpf 0-10  Casts     None seen /lpf Present (A)  Cast Type     N/A Hyaline casts  Mucus, UA     Not Estab. Present (A)  Bacteria, UA     None seen/Few Few    Lab Results  Component Value Date   WBC 7.5 01/23/2015   HGB 13.5 01/23/2015   HCT 40.2 01/23/2015   MCV 76.8 (L) 01/23/2015   PLT 164 01/23/2015    Lab Results  Component Value Date   CREATININE 0.98 01/23/2015    Lab Results  Component Value Date   HGBA1C 6.8 (H) 05/22/2012    Lab Results  Component Value Date   AST 21 05/23/2012   Lab Results  Component Value Date   ALT 20 05/23/2012   I have reviewed the labs.   Pertinent Imaging: No recent imaging   Assessment & Plan:    1. OAB (overactive bladder) Patient has failed 3 treatments of Botox Patient is not a candidate for anticholinergics due to memory issues Failed Myrbetriq Failed PTNS Failed Gemtesa Failed antibiotics Most recent cystoscopy was NED Urine sent for cytology  Will speak to the provider who manages his Mirapex prescription to see if there is an alternative medication or if he can discontinue that medication He would like more samples of the Gemtesa to see if by chance a second round would improve his urinary symptoms, #28 samples given We will schedule an appointment with Dr. Matilde Sprang for second opinion regarding his refractory OAB  2.  Bilateral hydroceles Stable  3. History of bladder cancer Scheduled for cystoscopy with Dr. Erlene Quan on 09/09/2020  Return for appointment with Dr. Matilde Sprang for refractory OAB.  These notes generated with voice recognition software. I apologize for typographical errors.  Zara Council, PA-C  [facility] [address]  [place], [place] [address] [phone]  I spent 30 minutes on the day of the encounter to include pre-visit record review, face-to-face time with the patient, and post-visit ordering of tests.

## 2019-11-28 ENCOUNTER — Ambulatory Visit: Payer: Medicare HMO | Admitting: Urology

## 2019-11-28 ENCOUNTER — Telehealth: Payer: Self-pay | Admitting: Urology

## 2019-11-28 ENCOUNTER — Other Ambulatory Visit: Payer: Self-pay | Admitting: Urology

## 2019-11-28 ENCOUNTER — Other Ambulatory Visit: Payer: Self-pay

## 2019-11-28 ENCOUNTER — Encounter: Payer: Self-pay | Admitting: Urology

## 2019-11-28 VITALS — BP 161/81 | HR 70 | Ht 67.0 in | Wt 232.0 lb

## 2019-11-28 DIAGNOSIS — N3281 Overactive bladder: Secondary | ICD-10-CM | POA: Diagnosis not present

## 2019-11-28 LAB — BLADDER SCAN AMB NON-IMAGING

## 2019-11-28 NOTE — Telephone Encounter (Signed)
LMOM for patient to return call.

## 2019-11-28 NOTE — Telephone Encounter (Signed)
Please let [name] know that I reviewed his medication list and noted that the Mirapex that he is prescribed has a side effect of urinary frequency.  He may want to speak to the provider who prescribes that for him and see if he can discontinue that medication or if there is another alternative.

## 2019-11-29 LAB — MICROSCOPIC EXAMINATION

## 2019-11-29 LAB — CYTOLOGY - NON PAP

## 2019-11-30 NOTE — Telephone Encounter (Signed)
Patient notified and will contact the provider to see if there is an alternative.

## 2019-12-10 ENCOUNTER — Ambulatory Visit: Payer: Medicare HMO | Admitting: Urology

## 2019-12-17 ENCOUNTER — Encounter: Payer: Self-pay | Admitting: Urology

## 2019-12-17 ENCOUNTER — Ambulatory Visit (INDEPENDENT_AMBULATORY_CARE_PROVIDER_SITE_OTHER): Payer: Medicare HMO | Admitting: Urology

## 2019-12-17 ENCOUNTER — Other Ambulatory Visit: Payer: Self-pay

## 2019-12-17 VITALS — BP 145/76 | HR 73 | Ht 67.0 in | Wt 229.0 lb

## 2019-12-17 DIAGNOSIS — N3281 Overactive bladder: Secondary | ICD-10-CM

## 2019-12-17 DIAGNOSIS — N3946 Mixed incontinence: Secondary | ICD-10-CM | POA: Diagnosis not present

## 2019-12-17 MED ORDER — GEMTESA 75 MG PO TABS: 75.0000 mg | ORAL_TABLET | Freq: Every day | ORAL | 11 refills | Status: DC

## 2019-12-17 NOTE — Progress Notes (Signed)
12/17/2019 9:13 AM   [name] 12-Sep-1945 [id number]  Referring provider: Ricardo Jericho, NP [address] [place],  [place] [address]  Chief Complaint  Patient presents with  . Over Active Bladder    HPI: [name]:  [name] is a 74 year old male with refractory OAB, bilateral hydroceles and bladder cancer who presents today for follow up.  Oxybutynin XL 15 mg -equivocal results, discontinued due to patient's dementia Myrbetriq 25 mg -equivocal results, cost prohibitive Botox injection 11/2017 in OR - at goal Botox injection 08/2018 in OR -lost effectiveness after 3 months Trial of antibiotics 12/2018 -no improvement in symptoms Botox injection 03/2019 in office - ineffective  PTNS 06/25/2019 - 09/10/2019 for 12 weekly treatments - ineffective Gemtesa 75 mg -equivocal results Patient is prescribed Lasix 20 mg 3 times daily, but he admits he does not adhere to the schedule as it causes him to urinate more frequently.   He is also taking Mirapex which has a side effect of urinary frequency.    Today Patient has urge incontinence.  He says he leaks with coughing sneezing and a small amount when he bends.  No bedwetting.  Wears 2 pads a day that are moderately wet.  Flow was poor.  Does not feel empty.  Voids every 2-3 hours.  Gets up twice at night.  25% improved on the new beta 3 agonist     PMH: Past Medical History:  Diagnosis Date  . Anxiety   . Arteriosclerosis of coronary artery 11/16/2011   Overview:  Stent 10/2011 stent rca 2015 with collaterals to lad which is chronically occluded   . Benign enlargement of prostate   . Benign essential HTN 06/11/2014  . Benign prostatic hypertrophy without urinary obstruction 07/31/2014  . Bilateral cataracts 05/16/2013   Overview:  [facility]. [facility]    . Bone spur of foot    Left  . BP (high blood pressure) 11/09/2012  . Cancer ([place])    skin (forehead) and bladder  . Carotid artery narrowing 02/08/2014  .  Depression   . Detrusor hypertrophy   . Diabetes ([place])   . Diabetes mellitus, type 2 ([place]) 12/12/2012  . Diverticulosis   . Dyspnea   . Esophageal reflux   . Esophageal reflux   . Fothergill's neuralgia 08/07/2012   Overview:  [facility]   . Gastritis   . GERD (gastroesophageal reflux disease)   . Headache    cluster headaches  . 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 Hearing loss in left ear   . Heart disease   . Hematuria   . Hemorrhoids   . History of hiatal hernia 12/14/2017   small   . Hypercholesteremia   . Lesion of bladder   . Myocardial infarct ([place])   . Presence of stent in coronary artery 11/09/2012  . Rectal bleeding   . Trigeminal neuralgia   . Trigeminal neuralgia   . Valvular heart disease   . Vitamin D deficiency     Surgical History: Past Surgical History:  Procedure Laterality Date  . APPENDECTOMY    . BOTOX INJECTION N/A 12/21/2017   Procedure: Bladder BOTOX INJECTION;  Surgeon: [name], MD;  Location: [facility] ORS;  Service: Urology;  Laterality: N/A;  . BOTOX INJECTION N/A 09/11/2018   Procedure: Bladder BOTOX INJECTION;  Surgeon: [name], MD;  Location: [facility] ORS;  Service: Urology;  Laterality: N/A;  . CARDIAC CATHETERIZATION    . CARDIAC CATHETERIZATION N/A 01/22/2015   Procedure:  Left Heart Cath;  Surgeon: Corey Skains, MD;  Location: [place] CV LAB;  Service: Cardiovascular;  Laterality: N/A;  . CARDIAC CATHETERIZATION N/A 01/22/2015   Procedure: Coronary Stent Intervention;  Surgeon: Isaias Cowman, MD;  Location: [place] CV LAB;  Service: Cardiovascular;  Laterality: N/A;  . CATARACT EXTRACTION, BILATERAL    . COLONOSCOPY WITH PROPOFOL N/A 12/08/2015   Procedure: COLONOSCOPY WITH PROPOFOL;  Surgeon: Lollie Sails, MD;  Location[facility] ENDOSCOPY;  Service: Endoscopy;  Laterality: N/A;  . COLONOSCOPY WITH PROPOFOL N/A 12/09/2015   Procedure: COLONOSCOPY WITH PROPOFOL;  Surgeon: Lollie Sails, MD;   Location: [facility] ENDOSCOPY;  Service: Endoscopy;  Laterality: N/A;  . CORONARY ANGIOPLASTY     5 stents  . CORONARY STENT PLACEMENT  2015   x5  . CYSTOSCOPY N/A 09/11/2018   Procedure: CYSTOSCOPY;  Surgeon: [name], MD;  Location: [facility] ORS;  Service: Urology;  Laterality: N/A;  . CYSTOSCOPY WITH BIOPSY N/A 12/21/2017   Procedure: CYSTOSCOPY WITH BIOPSY;  Surgeon: [name], MD;  Location: [facility] ORS;  Service: Urology;  Laterality: N/A;  . ESOPHAGOGASTRODUODENOSCOPY (EGD) WITH PROPOFOL N/A 12/08/2015   Procedure: ESOPHAGOGASTRODUODENOSCOPY (EGD) WITH PROPOFOL;  Surgeon: Lollie Sails, MD;  Location: [facility] ENDOSCOPY;  Service: Endoscopy;  Laterality: N/A;  . ESOPHAGOGASTRODUODENOSCOPY (EGD) WITH PROPOFOL N/A 12/13/2017   Procedure: ESOPHAGOGASTRODUODENOSCOPY (EGD) WITH PROPOFOL;  Surgeon: Lollie Sails, MD;  Location: [facility] ENDOSCOPY;  Service: Endoscopy;  Laterality: N/A;  . EYE SURGERY    . HERNIA REPAIR    . kidney tumor remove    . TRANSURETHRAL RESECTION OF BLADDER TUMOR WITH GYRUS (TURBT-GYRUS)  12/2013  . UMBILICAL HERNIA REPAIR    . urethral meatotomy      Home Medications:  Allergies as of 12/17/2019      Reactions   Ace Inhibitors Cough   Carbamazepine Rash   Other reaction(s): RASH Other reaction(s): RASH   Lyrica [pregabalin] Rash      Medication List       Accurate as of December 17, 2019  9:13 AM. If you have any questions, ask your nurse or doctor.        albuterol 108 (90 Base) MCG/ACT inhaler Commonly known as: VENTOLIN HFA   aspirin 81 MG tablet Take 81 mg by mouth daily.   citalopram 20 MG tablet Commonly known as: CELEXA Take 20 mg by mouth at bedtime.   Docusate Sodium 100 MG capsule Take by mouth.   furosemide 20 MG tablet Commonly known as: LASIX Take by mouth.   gabapentin 100 MG capsule Commonly known as: NEURONTIN Take 100 mg by mouth 3 (three) times daily.   gabapentin 300 MG capsule Commonly known as: NEURONTIN Take  1,200 mg by mouth 2 (two) times daily.   Gemtesa 75 MG Tabs Generic drug: Vibegron Take 75 mg by mouth daily.   hydrocortisone 2.5 % ointment APPLY TOPICALLY 2 TIMES A DAY. FOR SCALING OF EARS AND FACE AS NEEDED. AVOID USE ON ROSACEA   isosorbide mononitrate 30 MG 24 hr tablet Commonly known as: IMDUR Take 60 mg by mouth daily.   mirabegron ER 25 MG Tb24 tablet Commonly known as: MYRBETRIQ Take 1 tablet (25 mg total) by mouth daily.   multivitamins with iron Tabs tablet Take 1 tablet by mouth daily.   nitroGLYCERIN 0.4 MG SL tablet Commonly known as: NITROSTAT Place under the tongue.   Omega-3 1000 MG Caps Take 1 g by mouth daily. Reported on 03/21/2015   ONE TOUCH ULTRA  TEST test strip Generic drug: glucose blood USE 3 (THREE) TIMES DAILY USE AS INSTRUCTED.- ONE TOUCH ULTRA BLOOD GLUCOSE METER STRIPS   pantoprazole 40 MG tablet Commonly known as: PROTONIX Take 40 mg by mouth daily.   penicillin v potassium 500 MG tablet Commonly known as: VEETID Take 1 tablet (500 mg total) by mouth 3 (three) times daily.   pramipexole 1 MG tablet Commonly known as: MIRAPEX Take 1 mg by mouth daily.   pravastatin 80 MG tablet Commonly known as: PRAVACHOL TAKE 1 TABLET (80 MG TOTAL) BY MOUTH NIGHTLY.   RA Blood Glucose Monitor Devi by Does not apply route.   sildenafil 50 MG tablet Commonly known as: VIAGRA Take 50 mg by mouth daily as needed for erectile dysfunction.   terbinafine 250 MG tablet Commonly known as: LAMISIL Take by mouth.   Trileptal 150 MG tablet Generic drug: OXcarbazepine Take 150 mg by mouth at bedtime.   valsartan 40 MG tablet Commonly known as: DIOVAN Take 40 mg by mouth daily.   Vitamin D-1000 Max St 25 MCG (1000 UT) tablet Generic drug: Cholecalciferol Take 1,000 Units by mouth daily.       Allergies:  Allergies  Allergen Reactions  . Ace Inhibitors Cough  . Carbamazepine Rash    Other reaction(s): RASH Other reaction(s): RASH   .  Lyrica [Pregabalin] Rash    Family History: Family History  Problem Relation Age of Onset  . Kidney cancer Mother   . Prostate cancer Neg Hx     Social History:  reports that he quit smoking about 30 years ago. His smoking use included cigarettes. He has a 80.00 pack-year smoking history. He has never used smokeless tobacco. He reports previous alcohol use. He reports that he does not use drugs.  ROS:                                        Physical Exam: There were no vitals taken for this visit.  Constitutional:  Alert and oriented, No acute distress.   Laboratory Data: Lab Results  Component Value Date   WBC 7.5 01/23/2015   HGB 13.5 01/23/2015   HCT 40.2 01/23/2015   MCV 76.8 (L) 01/23/2015   PLT 164 01/23/2015    Lab Results  Component Value Date   CREATININE 0.98 01/23/2015    No results found for: PSA  No results found for: TESTOSTERONE  Lab Results  Component Value Date   HGBA1C 6.8 (H) 05/22/2012    Urinalysis    Component Value Date/Time   COLORURINE YELLOW (A) 12/16/2017 1005   APPEARANCEUR Clear 09/04/2019 0821   LABSPEC 1.014 12/16/2017 1005   LABSPEC 1.018 12/31/2013 0916   PHURINE 6.0 12/16/2017 1005   GLUCOSEU Negative 09/04/2019 0821   GLUCOSEU Negative 12/31/2013 0916   HGBUR SMALL (A) 12/16/2017 1005   BILIRUBINUR Negative 09/04/2019 0821   BILIRUBINUR Negative 12/31/2013 0916   KETONESUR NEGATIVE 12/16/2017 1005   PROTEINUR Negative 09/04/2019 0821   PROTEINUR NEGATIVE 12/16/2017 1005   NITRITE Negative 09/04/2019 0821   NITRITE NEGATIVE 12/16/2017 1005   LEUKOCYTESUR Negative 09/04/2019 0821   LEUKOCYTESUR Negative 12/31/2013 0916    Pertinent Imaging:   Assessment & Plan: Patient has refractory urgency incontinence.  He is elderly and has medical comorbidities.  He has exhausted antimuscarinic medications and has failed Myrbetriq.  I recommend for him to stay on the new beta 3  agonist since he has  limited other options.  He is also failed percutaneous tibial nerve stimulation.  The new medication is improving his quality of life.  The role of urodynamics discussed.  He may not be a good candidate for InterStim but is willing to look into things.  He has cardiac comorbidities.  Apples and prescription given  There are no diagnoses linked to this encounter.  No follow-ups on file.  Reece Packer, MD  [facility] [address], [place], [place] [address] [phone]

## 2019-12-18 LAB — URINALYSIS, COMPLETE
Bilirubin, UA: NEGATIVE
Glucose, UA: NEGATIVE
Leukocytes,UA: NEGATIVE
Nitrite, UA: NEGATIVE
Protein,UA: NEGATIVE
RBC, UA: NEGATIVE
Specific Gravity, UA: 1.025 (ref 1.005–1.030)
Urobilinogen, Ur: 0.2 mg/dL (ref 0.2–1.0)
pH, UA: 5 (ref 5.0–7.5)

## 2019-12-18 LAB — MICROSCOPIC EXAMINATION

## 2019-12-20 LAB — CULTURE, URINE COMPREHENSIVE

## 2019-12-24 ENCOUNTER — Other Ambulatory Visit (INDEPENDENT_AMBULATORY_CARE_PROVIDER_SITE_OTHER): Payer: Self-pay | Admitting: Vascular Surgery

## 2019-12-24 DIAGNOSIS — I739 Peripheral vascular disease, unspecified: Secondary | ICD-10-CM

## 2019-12-25 ENCOUNTER — Encounter (INDEPENDENT_AMBULATORY_CARE_PROVIDER_SITE_OTHER): Payer: Self-pay | Admitting: Vascular Surgery

## 2019-12-25 ENCOUNTER — Ambulatory Visit (INDEPENDENT_AMBULATORY_CARE_PROVIDER_SITE_OTHER): Payer: Medicare HMO | Admitting: Vascular Surgery

## 2019-12-25 ENCOUNTER — Other Ambulatory Visit: Payer: Self-pay

## 2019-12-25 ENCOUNTER — Ambulatory Visit (INDEPENDENT_AMBULATORY_CARE_PROVIDER_SITE_OTHER): Payer: Medicare HMO

## 2019-12-25 VITALS — BP 112/64 | HR 75 | Ht 67.0 in | Wt 234.0 lb

## 2019-12-25 DIAGNOSIS — I6523 Occlusion and stenosis of bilateral carotid arteries: Secondary | ICD-10-CM | POA: Diagnosis not present

## 2019-12-25 DIAGNOSIS — I10 Essential (primary) hypertension: Secondary | ICD-10-CM | POA: Diagnosis not present

## 2019-12-25 DIAGNOSIS — E119 Type 2 diabetes mellitus without complications: Secondary | ICD-10-CM | POA: Diagnosis not present

## 2019-12-25 DIAGNOSIS — L97221 Non-pressure chronic ulcer of left calf limited to breakdown of skin: Secondary | ICD-10-CM | POA: Diagnosis not present

## 2019-12-25 DIAGNOSIS — I739 Peripheral vascular disease, unspecified: Secondary | ICD-10-CM | POA: Diagnosis not present

## 2019-12-25 DIAGNOSIS — G500 Trigeminal neuralgia: Secondary | ICD-10-CM | POA: Insufficient documentation

## 2019-12-25 NOTE — Assessment & Plan Note (Signed)
blood glucose control important in reducing the progression of atherosclerotic disease. Also, involved in wound healing. On appropriate medications.  

## 2019-12-25 NOTE — Assessment & Plan Note (Signed)
blood pressure control important in reducing the progression of atherosclerotic disease. On appropriate oral medications.  

## 2019-12-25 NOTE — Assessment & Plan Note (Signed)
Recently checked and 1-49% bilaterally

## 2019-12-25 NOTE — Progress Notes (Signed)
Patient ID: [name], male   DOB: [birth year], 74 y.o.   MRN: [id number]  Chief Complaint  Patient presents with  . New Patient (Initial Visit)    [name]. Claudication abi    HPI [name] is a 74 y.o. male.  I am asked to see the patient by Dr. Nehemiah [name] for evaluation of non-healing ulcerations of the left lower leg.  The patient developed a tick bite on the left lower leg over 4 months ago.  This not only has not healed but new ulcerations have developed and the sites have enlarged.  The biggest thing was about the size of a quarter.  It is somewhat painful.  He has had significant leg swelling now in both legs.  Initially the swelling started in the left leg.  No fevers or chills.  For the last couple of weeks, he has been getting Haematologist wraps by his dermatologist and his wound is improved today.  We took down the Unna boot to evaluate it, and it was too small less than 1 cm wounds that look clean and healthy appearing.  We performed noninvasive arterial studies today to evaluate his arterial perfusion for wound healing.  These were normal and he did not have significant arterial insufficiency.     Past Medical History:  Diagnosis Date  . Anxiety   . Arteriosclerosis of coronary artery 11/16/2011   Overview:  Stent 10/2011 stent rca 2015 with collaterals to lad which is chronically occluded   . Benign enlargement of prostate   . Benign essential HTN 06/11/2014  . Benign prostatic hypertrophy without urinary obstruction 07/31/2014  . Bilateral cataracts 05/16/2013   Overview:  Dr. [facility]    . Bone spur of foot    Left  . BP (high blood pressure) 11/09/2012  . Cancer ([place])    skin (forehead) and bladder  . Carotid artery narrowing 02/08/2014  . Depression   . Detrusor hypertrophy   . Diabetes ([place])   . Diabetes mellitus, type 2 ([place]) 12/12/2012  . Diverticulosis   . Dyspnea   . Esophageal reflux   . Esophageal reflux   . Fothergill's neuralgia 08/07/2012    Overview:  [facility]   . Gastritis   . GERD (gastroesophageal reflux disease)   . Headache    cluster headaches  . 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 Hearing loss in left ear   . Heart disease   . Hematuria   . Hemorrhoids   . History of hiatal hernia 12/14/2017   small   . Hypercholesteremia   . Lesion of bladder   . Myocardial infarct ([place])   . Presence of stent in coronary artery 11/09/2012  . Rectal bleeding   . Trigeminal neuralgia   . Trigeminal neuralgia   . Valvular heart disease   . Vitamin D deficiency     Past Surgical History:  Procedure Laterality Date  . APPENDECTOMY    . BOTOX INJECTION N/A 12/21/2017   Procedure: Bladder BOTOX INJECTION;  Surgeon: [name], MD;  Location: [facility] ORS;  Service: Urology;  Laterality: N/A;  . BOTOX INJECTION N/A 09/11/2018   Procedure: Bladder BOTOX INJECTION;  Surgeon: [name], MD;  Location: [facility] ORS;  Service: Urology;  Laterality: N/A;  . CARDIAC CATHETERIZATION    . CARDIAC CATHETERIZATION N/A 01/22/2015   Procedure: Left Heart Cath;  Surgeon: Corey Skains, MD;  Location: [place] CV LAB;  Service: Cardiovascular;  Laterality: N/A;  . CARDIAC  CATHETERIZATION N/A 01/22/2015   Procedure: Coronary Stent Intervention;  Surgeon: Isaias Cowman, MD;  Location: [place] CV LAB;  Service: Cardiovascular;  Laterality: N/A;  . CATARACT EXTRACTION, BILATERAL    . COLONOSCOPY WITH PROPOFOL N/A 12/08/2015   Procedure: COLONOSCOPY WITH PROPOFOL;  Surgeon: Lollie Sails, MD;  Location: [facility] ENDOSCOPY;  Service: Endoscopy;  Laterality: N/A;  . COLONOSCOPY WITH PROPOFOL N/A 12/09/2015   Procedure: COLONOSCOPY WITH PROPOFOL;  Surgeon: Lollie Sails, MD;  Location: [facility] ENDOSCOPY;  Service: Endoscopy;  Laterality: N/A;  . CORONARY ANGIOPLASTY     5 stents  . CORONARY STENT PLACEMENT  2015   x5  . CYSTOSCOPY N/A 09/11/2018   Procedure: CYSTOSCOPY;  Surgeon: [name], MD;  Location: [facility]  ORS;  Service: Urology;  Laterality: N/A;  . CYSTOSCOPY WITH BIOPSY N/A 12/21/2017   Procedure: CYSTOSCOPY WITH BIOPSY;  Surgeon: [name], MD;  Location: [facility] ORS;  Service: Urology;  Laterality: N/A;  . ESOPHAGOGASTRODUODENOSCOPY (EGD) WITH PROPOFOL N/A 12/08/2015   Procedure: ESOPHAGOGASTRODUODENOSCOPY (EGD) WITH PROPOFOL;  Surgeon: Lollie Sails, MD;  Location: [facility] ENDOSCOPY;  Service: Endoscopy;  Laterality: N/A;  . ESOPHAGOGASTRODUODENOSCOPY (EGD) WITH PROPOFOL N/A 12/13/2017   Procedure: ESOPHAGOGASTRODUODENOSCOPY (EGD) WITH PROPOFOL;  Surgeon: Lollie Sails, MD;  Location: [facility] ENDOSCOPY;  Service: Endoscopy;  Laterality: N/A;  . EYE SURGERY    . HERNIA REPAIR    . kidney tumor remove    . TRANSURETHRAL RESECTION OF BLADDER TUMOR WITH GYRUS (TURBT-GYRUS)  12/2013  . UMBILICAL HERNIA REPAIR    . urethral meatotomy       Family History  Problem Relation Age of Onset  . Kidney cancer Mother   . Prostate cancer Neg Hx   no bleeding or clotting disorders. No aneurysms   Social History   Tobacco Use  . Smoking status: Former Smoker    Packs/day: 2.00    Years: 40.00    Pack years: 80.00    Types: Cigarettes    Quit date: 07/30/1989    Years since quitting: 30.4  . Smokeless tobacco: Never Used  Vaping Use  . Vaping Use: Never used  Substance Use Topics  . Alcohol use: Not Currently    Alcohol/week: 0.0 standard drinks    Comment: rarely  . Drug use: No    Allergies  Allergen Reactions  . Ace Inhibitors Cough  . Carbamazepine Rash    Other reaction(s): RASH Other reaction(s): RASH   . Lyrica [Pregabalin] Rash    Current Outpatient Medications  Medication Sig Dispense Refill  . albuterol (VENTOLIN HFA) 108 (90 Base) MCG/ACT inhaler     . aspirin 81 MG tablet Take 81 mg by mouth daily.    . Blood Glucose Monitoring Suppl (RA BLOOD GLUCOSE MONITOR) DEVI by Does not apply route.    . Cholecalciferol (VITAMIN D-1000 MAX ST) 1000 UNITS tablet Take  1,000 Units by mouth daily.     . citalopram (CELEXA) 20 MG tablet Take 20 mg by mouth at bedtime.     Mariane Baumgarten Sodium 100 MG capsule Take by mouth.    . gabapentin (NEURONTIN) 100 MG capsule Take 100 mg by mouth 3 (three) times daily.    [name] gabapentin (NEURONTIN) 300 MG capsule Take 1,200 mg by mouth 2 (two) times daily.    . hydrocortisone 2.5 % cream     . hydrocortisone 2.5 % ointment APPLY TOPICALLY 2 TIMES A DAY. FOR SCALING OF EARS AND FACE AS NEEDED. AVOID USE ON ROSACEA    .  isosorbide mononitrate (IMDUR) 30 MG 24 hr tablet Take 60 mg by mouth daily.     [name] ketoconazole (NIZORAL) 2 % cream     . mirabegron ER (MYRBETRIQ) 25 MG TB24 tablet Take 1 tablet (25 mg total) by mouth daily. 30 tablet 6  . Multiple Vitamins-Iron (MULTIVITAMINS WITH IRON) TABS tablet Take 1 tablet by mouth daily.    . nitroGLYCERIN (NITROSTAT) 0.4 MG SL tablet Place under the tongue.    . Omega-3 1000 MG CAPS Take 1 g by mouth daily. Reported on 03/21/2015    . ONE TOUCH ULTRA TEST test strip USE 3 (THREE) TIMES DAILY USE AS INSTRUCTED.- ONE TOUCH ULTRA BLOOD GLUCOSE METER STRIPS    . OXcarbazepine (TRILEPTAL) 150 MG tablet Take 150 mg by mouth at bedtime.     . pantoprazole (PROTONIX) 40 MG tablet Take 40 mg by mouth daily.    . penicillin v potassium (VEETID) 500 MG tablet Take 1 tablet (500 mg total) by mouth 3 (three) times daily. 21 tablet 0  . pramipexole (MIRAPEX) 1 MG tablet Take 1 mg by mouth daily.    . pravastatin (PRAVACHOL) 80 MG tablet TAKE 1 TABLET (80 MG TOTAL) BY MOUTH NIGHTLY.  3  . sildenafil (VIAGRA) 50 MG tablet Take 50 mg by mouth daily as needed for erectile dysfunction.    . terbinafine (LAMISIL) 250 MG tablet Take by mouth.    . valsartan (DIOVAN) 40 MG tablet Take 40 mg by mouth daily.    . Vibegron (GEMTESA) 75 MG TABS Take 75 mg by mouth daily. 30 tablet 11  . furosemide (LASIX) 20 MG tablet Take by mouth.     No current facility-administered medications for this visit.       REVIEW OF SYSTEMS (Negative unless checked)  Constitutional: [] Weight loss  [] Fever  [] Chills Cardiac: [] Chest pain   [] Chest pressure   [] Palpitations   [] Shortness of breath when laying flat   [] Shortness of breath at rest   [] Shortness of breath with exertion. Vascular:  [] Pain in legs with walking   [] Pain in legs at rest   [] Pain in legs when laying flat   [] Claudication   [] Pain in feet when walking  [] Pain in feet at rest  [] Pain in feet when laying flat   [] History of DVT   [] Phlebitis   [x] Swelling in legs   [] Varicose veins   [x] Non-healing ulcers Pulmonary:   [] Uses home oxygen   [] Productive cough   [] Hemoptysis   [] Wheeze  [] COPD   [] Asthma Neurologic:  [] Dizziness  [] Blackouts   [] Seizures   [] History of stroke   [] History of TIA  [] Aphasia   [] Temporary blindness   [] Dysphagia   [] Weakness or numbness in arms   [] Weakness or numbness in legs Musculoskeletal:  [x] Arthritis   [] Joint swelling   [] Joint pain   [] Low back pain Hematologic:  [] Easy bruising  [] Easy bleeding   [] Hypercoagulable state   [] Anemic  [] Hepatitis Gastrointestinal:  [] Blood in stool   [] Vomiting blood  [x] Gastroesophageal reflux/heartburn   [] Abdominal pain Genitourinary:  [] Chronic kidney disease   [] Difficult urination  [] Frequent urination  [] Burning with urination   [] Hematuria Skin:  [] Rashes   [x] Ulcers   [x] Wounds Psychological:  [x] History of anxiety   []  History of major depression.    Physical Exam BP 112/64   Pulse 75   Ht 5\' 7"  (1.702 m)   Wt 234 lb (106.1 kg)   BMI 36.65 kg/m  Gen:  WD/WN, NAD Head: [place]/AT, No  temporalis wasting.  Ear/Nose/Throat: Hearing grossly intact, nares w/o erythema or drainage, oropharynx w/o Erythema/Exudate Eyes: Conjunctiva clear, sclera non-icteric  Neck: trachea midline.  No JVD.  Pulmonary:  Good air movement, mildly labored respirations Cardiac: RRR, no JVD Vascular:  Vessel Right Left  Radial Palpable Palpable                          PT  1+ 1+  DP 2+ 2+   Gastrointestinal:. No masses, surgical incisions, or scars. Musculoskeletal: M/S 5/5 throughout.  Extremities without ischemic changes.  No deformity or atrophy. 1+ BLE edema. Neurologic: Sensation grossly intact in extremities.  Symmetrical.  Speech is fluent. Motor exam as listed above. Psychiatric: Judgment intact, Mood & affect appropriate for pt's clinical situation. Dermatologic: two small <1cm left lateral lower calf ulcers.  Minimal serous drainage. No erythema.    Radiology No results found.  Labs Recent Results (from the past 2160 hour(s))  Microscopic Examination     Status: Abnormal   Collection Time: 11/28/19  8:16 AM   Urine  Result Value Ref Range   WBC, UA 0-5 0 - 5 /hpf   RBC 0-2 0 - 2 /hpf   Epithelial Cells (non renal) 0-10 0 - 10 /hpf   Casts Present (A) None seen /lpf   Cast Type Hyaline casts N/A   Mucus, UA Present (A) Not Estab.   Bacteria, UA Few None seen/Few  Bladder Scan (Post Void Residual) in office     Status: None   Collection Time: 11/28/19  8:36 AM  Result Value Ref Range   Scan Result 91mL   Cytology - Non PAP;     Status: None   Collection Time: 11/28/19  9:08 AM  Result Value Ref Range   CYTOLOGY - NON GYN      Cytology - Non PAP CASE: [id number] PATIENT: [name] Non-Gynecological Cytology Report     Specimen Submitted: A. Urine  Clinical History: N32.81      DIAGNOSIS: A. URINE; VOIDED: - NEGATIVE FOR HIGH-GRADE UROTHELIAL CARCINOMA. - SEE COMMENT.  Comment: Rare atypical squamous cells are noted, the significance of which is unclear.  Clinical correlation is recommended.   GROSS DESCRIPTION: A. Labeled: Received labeled with the patient's name and date of birth (per requisition urine, voided) Received: Fresh Volume: 40 mL Description of fluid and container in which it is received: Received in a clear plastic specimen container with a yellow screw top lid is yellow clear  fluid. Specimen material submitted for: ThinPrep   Final Diagnosis performed by Betsy Pries, MD.   Electronically signed 11/29/2019 3:56:18PM The electronic signature indicates that the named Attending Pathologist has evaluated the specimen Technical component performed at [facility], [address] [place], [place] [address] Lab: [phone] Dir: Rush Farmer, MD, MMM  Professional component performed at [facility], [facility], [place], [place], [place] [address] Lab: [phone] Dir: Dellia Nims. Rubinas, MD   Urinalysis, Complete     Status: Abnormal   Collection Time: 12/17/19  9:42 AM  Result Value Ref Range   Specific Gravity, UA 1.025 1.005 - 1.030   pH, UA 5.0 5.0 - 7.5   Color, UA Orange Yellow   Appearance Ur Cloudy (A) Clear   Leukocytes,UA Negative Negative   Protein,UA Negative Negative/Trace   Glucose, UA Negative Negative   Ketones, UA Trace (A) Negative   RBC, UA Negative Negative   Bilirubin, UA Negative Negative   Urobilinogen, Ur  0.2 0.2 - 1.0 mg/dL   Nitrite, UA Negative Negative   Microscopic Examination See below:   Microscopic Examination     Status: Abnormal   Collection Time: 12/17/19  9:42 AM   Urine  Result Value Ref Range   WBC, UA 0-5 0 - 5 /hpf   RBC 0-2 0 - 2 /hpf   Epithelial Cells (non renal) 0-10 0 - 10 /hpf   Casts Present (A) None seen /lpf   Cast Type Granular casts (A) N/A   Mucus, UA Present (A) Not Estab.   Bacteria, UA Few None seen/Few  CULTURE, URINE COMPREHENSIVE     Status: None   Collection Time: 12/17/19  9:49 AM   Specimen: Urine   UR  Result Value Ref Range   Urine Culture, Comprehensive Final report    Organism ID, Bacteria Comment     Comment: Mixed urogenital flora 7,000 Colonies/mL     Assessment/Plan:  Carotid artery narrowing Recently checked and 1-49% bilaterally  Benign essential HTN blood pressure control important in reducing the progression of atherosclerotic disease. On  appropriate oral medications.   Diet-controlled type 2 diabetes mellitus (HCC) blood glucose control important in reducing the progression of atherosclerotic disease. Also, involved in wound healing. On appropriate medications.   Lower limb ulcer, calf, left, limited to breakdown of skin ([place]) We checked ABIs today which were normal at 1.12 on the right and 1.09 on the left with brisk triphasic waveforms and normal digital pressures consistent with no arterial insufficiency.  I would agree with continued Unna boots.  These will be changed weekly.  His wounds are small and I would hope that would only last another couple of weeks.  Would also be prudent to perform a venous reflux study to check for significant venous reflux which could be contributing to poor wound healing.  This will be done in the near future at the patient's convenience.      Leotis Pain 12/25/2019, 9:32 AM   This note was created with Dragon medical transcription system.  Any errors from dictation are unintentional.

## 2019-12-25 NOTE — Assessment & Plan Note (Signed)
We checked ABIs today which were normal at 1.12 on the right and 1.09 on the left with brisk triphasic waveforms and normal digital pressures consistent with no arterial insufficiency.  I would agree with continued Unna boots.  These will be changed weekly.  His wounds are small and I would hope that would only last another couple of weeks.  Would also be prudent to perform a venous reflux study to check for significant venous reflux which could be contributing to poor wound healing.  This will be done in the near future at the patient's convenience.

## 2019-12-25 NOTE — Patient Instructions (Signed)

## 2019-12-28 ENCOUNTER — Ambulatory Visit (INDEPENDENT_AMBULATORY_CARE_PROVIDER_SITE_OTHER): Payer: Medicare HMO

## 2019-12-28 ENCOUNTER — Ambulatory Visit (INDEPENDENT_AMBULATORY_CARE_PROVIDER_SITE_OTHER): Payer: Medicare HMO | Admitting: Nurse Practitioner

## 2019-12-28 ENCOUNTER — Other Ambulatory Visit: Payer: Self-pay

## 2019-12-28 ENCOUNTER — Encounter (INDEPENDENT_AMBULATORY_CARE_PROVIDER_SITE_OTHER): Payer: Self-pay | Admitting: Nurse Practitioner

## 2019-12-28 VITALS — BP 115/70 | HR 68 | Ht 67.0 in | Wt 234.0 lb

## 2019-12-28 DIAGNOSIS — L97221 Non-pressure chronic ulcer of left calf limited to breakdown of skin: Secondary | ICD-10-CM

## 2019-12-28 DIAGNOSIS — I8002 Phlebitis and thrombophlebitis of superficial vessels of left lower extremity: Secondary | ICD-10-CM | POA: Diagnosis not present

## 2019-12-28 MED ORDER — APIXABAN 2.5 MG PO TABS: 2.5000 mg | ORAL_TABLET | Freq: Two times a day (BID) | ORAL | 2 refills | Status: DC

## 2019-12-28 NOTE — Progress Notes (Signed)
Subjective:    Patient ID: [name], male    DOB: Oct 15, 1945, 74 y.o.   MRN: [id number] Chief Complaint  Patient presents with  . Follow-up    pt conv lle ven reflux    [name] is a 74 y.o. male.  The patient presents today for follow-up studies regarding lower extremity edema and slow healing ulceration of the left lower extremity.  The patient had a tick bite on his left leg about 4 months ago.  This is not healed but he also has had some new ulcerations that are developed.  The patient was previously placed in [place] wraps at our office.  Today the patient continues to have 1 small ulceration that has been slow to heal.  The lower extremity has improved and the swelling.  Previous noninvasive study showed no evidence of arterial insufficiency.  The patient denies any fever, chills, nausea, vomiting or diarrhea.  Today noninvasive studies show reflux in the left common femoral vein.  There is also reflux in the great saphenous vein at the saphenofemoral junction as well as at the distal thigh and the knee as well.  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   Review of Systems  Respiratory: Positive for shortness of breath.   Cardiovascular: Positive for leg swelling.  Skin: Positive for wound.  All other systems reviewed and are negative.      Objective:   Physical Exam Vitals reviewed.  HENT:     Head: Normocephalic.  Cardiovascular:     Rate and Rhythm: Normal rate and regular rhythm.     Pulses: Normal pulses.     Heart sounds: Normal heart sounds.  Skin:    General: Skin is warm and dry.  Neurological:     Mental Status: He is alert and oriented to person, place, and time.  Psychiatric:        Mood and Affect: Mood normal.        Behavior: Behavior normal.        Thought Content: Thought content normal.        Judgment:  Judgment normal.     BP 115/70   Pulse 68   Ht 5\' 7"  (1.702 m)   Wt 234 lb (106.1 kg)   BMI 36.65 kg/m   Past Medical History:  Diagnosis Date  . Anxiety   . Arteriosclerosis of coronary artery 11/16/2011   Overview:  Stent 10/2011 stent rca 2015 with collaterals to lad which is chronically occluded   . Benign enlargement of prostate   . Benign essential HTN 06/11/2014  . Benign prostatic hypertrophy without urinary obstruction 07/31/2014  . Bilateral cataracts 05/16/2013   Overview:  Dr. [facility]    . Bone spur of foot    Left  . BP (high blood pressure) 11/09/2012  . Cancer ([place])    skin (forehead) and bladder  . Carotid artery narrowing 02/08/2014  . Depression   . Detrusor hypertrophy   . Diabetes ([place])   . Diabetes mellitus, type 2 ([place]) 12/12/2012  . Diverticulosis   . Dyspnea   . Esophageal reflux   . Esophageal reflux   . Fothergill's neuralgia 08/07/2012   Overview:  [facility]   . Gastritis   . GERD (gastroesophageal reflux disease)   . Headache    cluster headaches  . Healed  myocardial infarct 11/09/2012  . Hearing loss in left ear   . Heart disease   . Hematuria   . Hemorrhoids   . History of hiatal hernia 12/14/2017   small   . Hypercholesteremia   . Lesion of bladder   . Myocardial infarct ([place])   . Presence of stent in coronary artery 11/09/2012  . Rectal bleeding   . Trigeminal neuralgia   . Trigeminal neuralgia   . Valvular heart disease   . Vitamin D deficiency     Social History   Socioeconomic History  . Marital status: Married    Spouse name: Not on file  . Number of children: Not on file  . Years of education: Not on file  . Highest education level: Not on file  Occupational History  . Not on file  Tobacco Use  . Smoking status: Former Smoker    Packs/day: 2.00    Years: 40.00    Pack years: 80.00    Types: Cigarettes    Quit date: 07/30/1989    Years since quitting: 30.4  . Smokeless tobacco: Never Used  Vaping Use   . Vaping Use: Never used  Substance and Sexual Activity  . Alcohol use: Not Currently    Alcohol/week: 0.0 standard drinks    Comment: rarely  . Drug use: No  . Sexual activity: Not on file  Other Topics Concern  . Not on file  Social History Narrative  . Not on file   Social Determinants of Health   Financial Resource Strain:   . Difficulty of Paying Living Expenses: Not on file  Food Insecurity:   . Worried About Charity fundraiser in the Last Year: Not on file  . Ran Out of Food in the Last Year: Not on file  Transportation Needs:   . Lack of Transportation (Medical): Not on file  . Lack of Transportation (Non-Medical): Not on file  Physical Activity:   . Days of Exercise per Week: Not on file  . Minutes of Exercise per Session: Not on file  Stress:   . Feeling of Stress : Not on file  Social Connections:   . Frequency of Communication with Friends and Family: Not on file  . Frequency of Social Gatherings with Friends and Family: Not on file  . Attends Religious Services: Not on file  . Active Member of Clubs or Organizations: Not on file  . Attends Archivist Meetings: Not on file  . Marital Status: Not on file  Intimate Partner Violence:   . Fear of Current or Ex-Partner: Not on file  . Emotionally Abused: Not on file  . Physically Abused: Not on file  . Sexually Abused: Not on file    Past Surgical History:  Procedure Laterality Date  . APPENDECTOMY    . BOTOX INJECTION N/A 12/21/2017   Procedure: Bladder BOTOX INJECTION;  Surgeon: [name], MD;  Location: [facility] ORS;  Service: Urology;  Laterality: N/A;  . BOTOX INJECTION N/A 09/11/2018   Procedure: Bladder BOTOX INJECTION;  Surgeon: [name], MD;  Location: [facility] ORS;  Service: Urology;  Laterality: N/A;  . CARDIAC CATHETERIZATION    . CARDIAC CATHETERIZATION N/A 01/22/2015   Procedure: Left Heart Cath;  Surgeon: Corey Skains, MD;  Location: [place] CV LAB;  Service:  Cardiovascular;  Laterality: N/A;  . CARDIAC CATHETERIZATION N/A 01/22/2015   Procedure: Coronary Stent Intervention;  Surgeon: Isaias Cowman, MD;  Location: [place] [facility] LAB;  Service: Cardiovascular;  Laterality:  N/A;  . CATARACT EXTRACTION, BILATERAL    . COLONOSCOPY WITH PROPOFOL N/A 12/08/2015   Procedure: COLONOSCOPY WITH PROPOFOL;  Surgeon: Lollie Sails, MD;  Location: [facility] ENDOSCOPY;  Service: Endoscopy;  Laterality: N/A;  . COLONOSCOPY WITH PROPOFOL N/A 12/09/2015   Procedure: COLONOSCOPY WITH PROPOFOL;  Surgeon: Lollie Sails, MD;  Location: [facility] ENDOSCOPY;  Service: Endoscopy;  Laterality: N/A;  . CORONARY ANGIOPLASTY     5 stents  . CORONARY STENT PLACEMENT  2015   x5  . CYSTOSCOPY N/A 09/11/2018   Procedure: CYSTOSCOPY;  Surgeon: [name], MD;  Location: [facility] ORS;  Service: Urology;  Laterality: N/A;  . CYSTOSCOPY WITH BIOPSY N/A 12/21/2017   Procedure: CYSTOSCOPY WITH BIOPSY;  Surgeon: [name], MD;  Location: [facility] ORS;  Service: Urology;  Laterality: N/A;  . ESOPHAGOGASTRODUODENOSCOPY (EGD) WITH PROPOFOL N/A 12/08/2015   Procedure: ESOPHAGOGASTRODUODENOSCOPY (EGD) WITH PROPOFOL;  Surgeon: Lollie Sails, MD;  Location: [facility] ENDOSCOPY;  Service: Endoscopy;  Laterality: N/A;  . ESOPHAGOGASTRODUODENOSCOPY (EGD) WITH PROPOFOL N/A 12/13/2017   Procedure: ESOPHAGOGASTRODUODENOSCOPY (EGD) WITH PROPOFOL;  Surgeon: Lollie Sails, MD;  Location: [facility] ENDOSCOPY;  Service: Endoscopy;  Laterality: N/A;  . EYE SURGERY    . HERNIA REPAIR    . kidney tumor remove    . TRANSURETHRAL RESECTION OF BLADDER TUMOR WITH GYRUS (TURBT-GYRUS)  12/2013  . UMBILICAL HERNIA REPAIR    . urethral meatotomy      Family History  Problem Relation Age of Onset  . Kidney cancer Mother   . Prostate cancer Neg Hx     Allergies  Allergen Reactions  . Ace Inhibitors Cough  . Carbamazepine Rash    Other reaction(s): RASH Other reaction(s): RASH   . Lyrica  [Pregabalin] Rash    CBC Latest Ref Rng & Units 01/23/2015 12/31/2013 05/23/2012  WBC 3.8 - 10.6 K/uL 7.5 6.6 5.9  Hemoglobin 13.0 - 18.0 g/dL 13.5 10.7(L) 13.5  Hematocrit 40 - 52 % 40.2 35.0(L) 39.9(L)  Platelets 150 - 440 K/uL 164 265 179      CMP     Component Value Date/Time   NA 138 01/23/2015 0722   NA 140 08/23/2013 0539   K 4.5 01/23/2015 0722   K 4.4 02/12/2014 0920   CL 106 01/23/2015 0722   CL 107 08/23/2013 0539   CO2 25 01/23/2015 0722   CO2 25 08/23/2013 0539   GLUCOSE 107 (H) 01/23/2015 0722   GLUCOSE 111 (H) 08/23/2013 0539   BUN 13 01/23/2015 0722   BUN 11 08/23/2013 0539   CREATININE 0.98 01/23/2015 0722   CREATININE 1.16 08/23/2013 0539   CALCIUM 9.1 01/23/2015 0722   CALCIUM 8.7 08/23/2013 0539   PROT 7.2 05/23/2012 0703   ALBUMIN 3.6 05/23/2012 0703   AST 21 05/23/2012 0703   ALT 20 05/23/2012 0703   ALKPHOS 57 05/23/2012 0703   BILITOT 0.5 05/23/2012 0703   GFRNONAA >60 01/23/2015 0722   GFRNONAA >60 08/23/2013 0539   GFRAA >60 01/23/2015 0722   GFRAA >60 08/23/2013 0539         Assessment & Plan:   1. Lower limb ulcer, calf, left, limited to breakdown of skin (HCC) The swelling is much improved however there is still a small ulceration on his left lower extremity.  Patient is advised to continue to keep the wound clean dry and intact and to keep it covered.  The patient is also advised to continue to wear medical grade 1 compression stockings to maintain control of his  swelling.  He is instructed to wear the compression socks in the morning and remove them at night he is specifically instructed not to sleep in the stockings.  The patient is also advised that if his wound becomes larger or begins weeping he should contact our office for wrap placement  2. 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  Prior to planning any  vascular intervention we will start the patient on a low dose of Eliquis to see if this helps to resolve the thrombus sooner as he has had this in place for some time.  The patient is also advised to continue with conservative therapy as outlined above.  The patient will return in 3 months for evaluation.   Current Outpatient Medications on File Prior to Visit  Medication Sig Dispense Refill  . albuterol (VENTOLIN HFA) 108 (90 Base) MCG/ACT inhaler     . aspirin 81 MG tablet Take 81 mg by mouth daily.    . Blood Glucose Monitoring Suppl (RA BLOOD GLUCOSE MONITOR) DEVI by Does not apply route.    . Cholecalciferol (VITAMIN D-1000 MAX ST) 1000 UNITS tablet Take 1,000 Units by mouth daily.     . citalopram (CELEXA) 20 MG tablet Take 20 mg by mouth at bedtime.     Mariane Baumgarten Sodium 100 MG capsule Take by mouth.    . gabapentin (NEURONTIN) 100 MG capsule Take 100 mg by mouth 3 (three) times daily.    [name] gabapentin (NEURONTIN) 300 MG capsule Take 1,200 mg by mouth 2 (two) times daily.    . hydrocortisone 2.5 % cream     . hydrocortisone 2.5 % ointment APPLY TOPICALLY 2 TIMES A DAY. FOR SCALING OF EARS AND FACE AS NEEDED. AVOID USE ON ROSACEA    . isosorbide mononitrate (IMDUR) 30 MG 24 hr tablet Take 60 mg by mouth daily.     [name] ketoconazole (NIZORAL) 2 % cream     . mirabegron ER (MYRBETRIQ) 25 MG TB24 tablet Take 1 tablet (25 mg total) by mouth daily. 30 tablet 6  . Multiple Vitamins-Iron (MULTIVITAMINS WITH IRON) TABS tablet Take 1 tablet by mouth daily.    . nitroGLYCERIN (NITROSTAT) 0.4 MG SL tablet Place under the tongue.    . Omega-3 1000 MG CAPS Take 1 g by mouth daily. Reported on 03/21/2015    . ONE TOUCH ULTRA TEST test strip USE 3 (THREE) TIMES DAILY USE AS INSTRUCTED.- ONE TOUCH ULTRA BLOOD GLUCOSE METER STRIPS    . OXcarbazepine (TRILEPTAL) 150 MG tablet Take 150 mg by mouth at bedtime.     . pantoprazole (PROTONIX) 40 MG tablet Take 40 mg by mouth daily.    . penicillin v potassium  (VEETID) 500 MG tablet Take 1 tablet (500 mg total) by mouth 3 (three) times daily. 21 tablet 0  . pramipexole (MIRAPEX) 1 MG tablet Take 1 mg by mouth daily.    . pravastatin (PRAVACHOL) 80 MG tablet TAKE 1 TABLET (80 MG TOTAL) BY MOUTH NIGHTLY.  3  . sildenafil (VIAGRA) 50 MG tablet Take 50 mg by mouth daily as needed for erectile dysfunction.    . terbinafine (LAMISIL) 250 MG tablet Take by mouth.    . valsartan (DIOVAN) 40 MG tablet Take 40 mg by mouth daily.    . Vibegron (GEMTESA) 75 MG TABS Take 75 mg by mouth daily. 30 tablet 11  . furosemide (LASIX)  20 MG tablet Take by mouth.     No current facility-administered medications on file prior to visit.    There are no Patient Instructions on file for this visit. No follow-ups on file.   Kris Hartmann, NP

## 2020-01-02 ENCOUNTER — Other Ambulatory Visit: Payer: Self-pay | Admitting: Urology

## 2020-01-07 ENCOUNTER — Ambulatory Visit (INDEPENDENT_AMBULATORY_CARE_PROVIDER_SITE_OTHER): Payer: Medicare HMO | Admitting: Urology

## 2020-01-07 ENCOUNTER — Other Ambulatory Visit: Payer: Self-pay

## 2020-01-07 VITALS — BP 102/66 | HR 71 | Wt 234.0 lb

## 2020-01-07 DIAGNOSIS — N3946 Mixed incontinence: Secondary | ICD-10-CM | POA: Diagnosis not present

## 2020-01-07 MED ORDER — TROSPIUM CHLORIDE ER 60 MG PO CP24: 60.0000 mg | ORAL_CAPSULE | Freq: Every day | ORAL | 11 refills | Status: DC

## 2020-01-07 NOTE — Progress Notes (Signed)
01/07/2020 10:05 AM   [name] Jun 16, 1945 [id number]  Referring provider: Earlie Counts, [address],  [place] [address]  Chief Complaint  Patient presents with   Over Active Bladder    HPI: [name]:  [name] is a 74 year old male with refractory OAB, bilateral hydroceles and bladder cancer who presents today for follow up.  Oxybutynin XL 15 mg -equivocal results, discontinued due to patient's dementia Myrbetriq 25 mg -equivocal results, cost prohibitive Botox injection 10/2019in OR - at goal Botox injection 08/2018 in OR -lost effectiveness after 3 months Trial of antibiotics 12/2018 -no improvement in symptoms Botox injection 03/2019 in office - ineffective  PTNS 06/25/2019 - 09/10/2019 for 12 weekly treatments - ineffective Gemtesa 75 mg -equivocal results Patient is prescribed Lasix 20 mg 3 times daily, but he admits he does not adhere to the schedule as it causes him to urinate more frequently.He is also taking Mirapex which has a side effect of urinary frequency.  Today Patient has urge incontinence.  He says he leaks with coughing sneezing and a small amount when he bends.  No bedwetting.  Wears 2 pads a day that are moderately wet.  Flow was poor.  Does not feel empty.  Voids every 2-3 hours.  Gets up twice at night.  25% improved on the new beta 3 agonist  Patient has refractory urgency incontinence.  He is elderly and has medical comorbidities.  He has exhausted antimuscarinic medications and has failed Myrbetriq.  I recommend for him to stay on the new beta 3 agonist since he has limited other options.  He is also failed percutaneous tibial nerve stimulation.  The new medication is improving his quality of life.  The role of urodynamics discussed.  He may not be a good candidate for InterStim but is willing to look into things.  He has cardiac comorbidities.  Apples and prescription given  TOday [place] stable.  Incontinence stable. On  urodynamics patient had not voided and was catheterized for 200 mL.  Max and bladder capacity was 270 mL.  Bladder was unstable reaching a pressure of 87 cm water.  He had to void off the contraction.  No stress incontinence with a Valsalva pressure 78 cm water.  During voluntary voiding he voided 145 mL.  Maximum flow was 12 mils per second.  Maximum voiding pressure 30 cm water.  Residual is 90 mL.  EMG activity noted during voiding.  Bladder trabeculation noted with diverticuli.  The details of the urodynamics were signed and dictated.  There were a lot of chronic changes to his bladder.  It was almost Christmas tree like    PMH: Past Medical History:  Diagnosis Date   Anxiety    Arteriosclerosis of coronary artery 11/16/2011   Overview:  Stent 10/2011 stent rca 2015 with collaterals to lad which is chronically occluded    Benign enlargement of prostate    Benign essential HTN 06/11/2014   Benign prostatic hypertrophy without urinary obstruction 07/31/2014   Bilateral cataracts 05/16/2013   Overview:  [facility]. [facility]     Bone spur of foot    Left   BP (high blood pressure) 11/09/2012   Cancer ([place])    skin (forehead) and bladder   Carotid artery narrowing 02/08/2014   Depression    Detrusor hypertrophy    Diabetes ([place])    Diabetes mellitus, type 2 ([place]) 12/12/2012   Diverticulosis    Dyspnea    Esophageal reflux  Esophageal reflux    Fothergill's neuralgia 08/07/2012   Overview:  [facility]    Gastritis    GERD (gastroesophageal reflux disease)    Headache    cluster headaches   Healed myocardial infarct 11/09/2012   Hearing loss in left ear    Heart disease    Hematuria    Hemorrhoids    History of hiatal hernia 12/14/2017   small    Hypercholesteremia    Lesion of bladder    Myocardial infarct [facility])    Presence of stent in coronary artery 11/09/2012   Rectal bleeding    Trigeminal neuralgia    Trigeminal neuralgia     Valvular heart disease    Vitamin D deficiency     Surgical History: Past Surgical History:  Procedure Laterality Date   APPENDECTOMY     BOTOX INJECTION N/A 12/21/2017   Procedure: Bladder BOTOX INJECTION;  Surgeon: [name], MD;  Location: [facility] ORS;  Service: Urology;  Laterality: N/A;   BOTOX INJECTION N/A 09/11/2018   Procedure: Bladder BOTOX INJECTION;  Surgeon: [name], MD;  Location: [facility] ORS;  Service: Urology;  Laterality: N/A;   CARDIAC CATHETERIZATION     CARDIAC CATHETERIZATION N/A 01/22/2015   Procedure: Left Heart Cath;  Surgeon: Corey Skains, MD;  Location: [place] CV LAB;  Service: Cardiovascular;  Laterality: N/A;   CARDIAC CATHETERIZATION N/A 01/22/2015   Procedure: Coronary Stent Intervention;  Surgeon: Isaias Cowman, MD;  Location: [place] CV LAB;  Service: Cardiovascular;  Laterality: N/A;   CATARACT EXTRACTION, BILATERAL     COLONOSCOPY WITH PROPOFOL N/A 12/08/2015   Procedure: COLONOSCOPY WITH PROPOFOL;  Surgeon: Lollie Sails, MD;  Location: [facility] ENDOSCOPY;  Service: Endoscopy;  Laterality: N/A;   COLONOSCOPY WITH PROPOFOL N/A 12/09/2015   Procedure: COLONOSCOPY WITH PROPOFOL;  Surgeon: Lollie Sails, MD;  Location: [facility] ENDOSCOPY;  Service: Endoscopy;  Laterality: N/A;   CORONARY ANGIOPLASTY     5 stents   CORONARY STENT PLACEMENT  2015   x5   CYSTOSCOPY N/A 09/11/2018   Procedure: CYSTOSCOPY;  Surgeon: [name], MD;  Location: [facility] ORS;  Service: Urology;  Laterality: N/A;   CYSTOSCOPY WITH BIOPSY N/A 12/21/2017   Procedure: CYSTOSCOPY WITH BIOPSY;  Surgeon: [name], MD;  Location: [facility] ORS;  Service: Urology;  Laterality: N/A;   ESOPHAGOGASTRODUODENOSCOPY (EGD) WITH PROPOFOL N/A 12/08/2015   Procedure: ESOPHAGOGASTRODUODENOSCOPY (EGD) WITH PROPOFOL;  Surgeon: Lollie Sails, MD;  Location[facility] ENDOSCOPY;  Service: Endoscopy;  Laterality: N/A;   ESOPHAGOGASTRODUODENOSCOPY (EGD) WITH  PROPOFOL N/A 12/13/2017   Procedure: ESOPHAGOGASTRODUODENOSCOPY (EGD) WITH PROPOFOL;  Surgeon: Lollie Sails, MD;  Location: [facility] ENDOSCOPY;  Service: Endoscopy;  Laterality: N/A;   EYE SURGERY     HERNIA REPAIR     kidney tumor remove     TRANSURETHRAL RESECTION OF BLADDER TUMOR WITH GYRUS (TURBT-GYRUS)  36/6440   UMBILICAL HERNIA REPAIR     urethral meatotomy      Home Medications:  Allergies as of 01/07/2020      Reactions   Ace Inhibitors Cough   Carbamazepine Rash   Other reaction(s): RASH Other reaction(s): RASH   Lyrica [pregabalin] Rash      Medication List       Accurate as of January 07, 2020 10:05 AM. If you have any questions, ask your nurse or doctor.        STOP taking these medications   mirabegron ER 25 MG Tb24 tablet Commonly known as: MYRBETRIQ Stopped by: Nicki Reaper  A Katianne Barre, MD   penicillin v potassium 500 MG tablet Commonly known as: VEETID Stopped by: Reece Packer, MD     TAKE these medications   albuterol 108 (90 Base) MCG/ACT inhaler Commonly known as: VENTOLIN HFA   apixaban 2.5 MG Tabs tablet Commonly known as: ELIQUIS Take 1 tablet (2.5 mg total) by mouth 2 (two) times daily.   aspirin 81 MG tablet Take 81 mg by mouth daily.   citalopram 20 MG tablet Commonly known as: CELEXA Take 20 mg by mouth at bedtime.   Docusate Sodium 100 MG capsule Take by mouth.   furosemide 20 MG tablet Commonly known as: LASIX Take by mouth.   gabapentin 100 MG capsule Commonly known as: NEURONTIN Take 100 mg by mouth 3 (three) times daily.   gabapentin 300 MG capsule Commonly known as: NEURONTIN Take 1,200 mg by mouth 2 (two) times daily.   Gemtesa 75 MG Tabs Generic drug: Vibegron Take 75 mg by mouth daily.   hydrocortisone 2.5 % cream What changed: Another medication with the same name was removed. Continue taking this medication, and follow the directions you see here. Changed by: Reece Packer, MD   isosorbide  mononitrate 30 MG 24 hr tablet Commonly known as: IMDUR Take 60 mg by mouth daily.   ketoconazole 2 % cream Commonly known as: NIZORAL   multivitamins with iron Tabs tablet Take 1 tablet by mouth daily.   nitroGLYCERIN 0.4 MG SL tablet Commonly known as: NITROSTAT Place under the tongue.   Omega-3 1000 MG Caps Take 1 g by mouth daily. Reported on 03/21/2015   ONE TOUCH ULTRA TEST test strip Generic drug: glucose blood USE 3 (THREE) TIMES DAILY USE AS INSTRUCTED.- ONE TOUCH ULTRA BLOOD GLUCOSE METER STRIPS   pantoprazole 40 MG tablet Commonly known as: PROTONIX Take 40 mg by mouth daily.   pramipexole 1 MG tablet Commonly known as: MIRAPEX Take 1 mg by mouth daily.   pravastatin 80 MG tablet Commonly known as: PRAVACHOL TAKE 1 TABLET (80 MG TOTAL) BY MOUTH NIGHTLY.   RA Blood Glucose Monitor Devi by Does not apply route.   sildenafil 50 MG tablet Commonly known as: VIAGRA Take 50 mg by mouth daily as needed for erectile dysfunction.   terbinafine 250 MG tablet Commonly known as: LAMISIL Take by mouth.   Trileptal 150 MG tablet Generic drug: OXcarbazepine Take 150 mg by mouth at bedtime.   valsartan 40 MG tablet Commonly known as: DIOVAN Take 40 mg by mouth daily.   Vitamin D-1000 Max St 25 MCG (1000 UT) tablet Generic drug: Cholecalciferol Take 1,000 Units by mouth daily.       Allergies:  Allergies  Allergen Reactions   Ace Inhibitors Cough   Carbamazepine Rash    Other reaction(s): RASH Other reaction(s): RASH    Lyrica [Pregabalin] Rash    Family History: Family History  Problem Relation Age of Onset   Kidney cancer Mother    Prostate cancer Neg Hx     Social History:  reports that he quit smoking about 30 years ago. His smoking use included cigarettes. He has a 80.00 pack-year smoking history. He has never used smokeless tobacco. He reports previous alcohol use. He reports that he does not use drugs.  ROS:  Physical Exam: BP 102/66    Pulse 71    Wt 234 lb (106.1 kg)    BMI 36.65 kg/m   Constitutional:  Alert and oriented, No acute distress.   Laboratory Data: Lab Results  Component Value Date   WBC 7.5 01/23/2015   HGB 13.5 01/23/2015   HCT 40.2 01/23/2015   MCV 76.8 (L) 01/23/2015   PLT 164 01/23/2015    Lab Results  Component Value Date   CREATININE 0.98 01/23/2015    No results found for: PSA  No results found for: TESTOSTERONE  Lab Results  Component Value Date   HGBA1C 6.8 (H) 05/22/2012    Urinalysis    Component Value Date/Time   COLORURINE YELLOW (A) 12/16/2017 1005   APPEARANCEUR Cloudy (A) 12/17/2019 0942   LABSPEC 1.014 12/16/2017 1005   LABSPEC 1.018 12/31/2013 0916   PHURINE 6.0 12/16/2017 1005   GLUCOSEU Negative 12/17/2019 0942   GLUCOSEU Negative 12/31/2013 0916   HGBUR SMALL (A) 12/16/2017 1005   BILIRUBINUR Negative 12/17/2019 0942   BILIRUBINUR Negative 12/31/2013 0916   KETONESUR NEGATIVE 12/16/2017 1005   PROTEINUR Negative 12/17/2019 0942   PROTEINUR NEGATIVE 12/16/2017 1005   NITRITE Negative 12/17/2019 0942   NITRITE NEGATIVE 12/16/2017 1005   LEUKOCYTESUR Negative 12/17/2019 0942   LEUKOCYTESUR Negative 12/31/2013 0916    Pertinent Imaging:   Assessment & Plan: Patient has exhausted his refractory overactive bladder options.  We talked with InterStim recognizing his age and comorbidities.  He just started on Eliquis with his baby aspirin last week for probable past history of left leg clot.  He will speak to his physicians to see if he could stop this for the test and for the implant.  He understands my concerns about his general health.  The beta 3 agonist helps some with his foot on the floor syndrome.  I decided given trospium XR 60 mg 3x11 we gave him a phone number to call.  He would then need medical clearance from my office  There are no diagnoses linked to this encounter.  No  follow-ups on file.  Reece Packer, MD  [facility] [address] [place], [place] [address] [phone]

## 2020-01-08 ENCOUNTER — Telehealth: Payer: Self-pay

## 2020-01-08 NOTE — Telephone Encounter (Signed)
Patient states trospium costs more than 100 dollars for a month supply and he can not afford that. Is there anything else we can put him on?

## 2020-01-09 MED ORDER — SOLIFENACIN SUCCINATE 5 MG PO TABS: 5.0000 mg | ORAL_TABLET | Freq: Every day | ORAL | 11 refills | Status: DC

## 2020-01-09 NOTE — Telephone Encounter (Signed)
Vesicare 5 mg 30x11

## 2020-01-28 ENCOUNTER — Ambulatory Visit: Payer: Self-pay | Admitting: Urology

## 2020-02-04 ENCOUNTER — Telehealth: Payer: Self-pay

## 2020-02-04 NOTE — Telephone Encounter (Signed)
Incoming call on triage line from patient in regards to medication. Patient would like to report to Dr Matilde Sprang that  Solifenacin is not working for him. Are there any alternatives? Patient is interested in InterStim. Patient has appt scheduled for 05/12/20, does he need to be seen sooner? Please advise.

## 2020-02-07 NOTE — Telephone Encounter (Signed)
Yes please bring him in sooner for re-evaluation

## 2020-02-07 NOTE — Telephone Encounter (Signed)
Moved patients appt up from March to Dec 27 per provider. Patient notified, verbalized understanding.

## 2020-02-25 ENCOUNTER — Ambulatory Visit: Payer: Medicare HMO | Admitting: Urology

## 2020-02-25 ENCOUNTER — Encounter: Payer: Self-pay | Admitting: Urology

## 2020-02-25 ENCOUNTER — Other Ambulatory Visit: Payer: Self-pay

## 2020-02-25 VITALS — BP 124/66 | HR 71 | Ht 67.0 in | Wt 230.0 lb

## 2020-02-25 DIAGNOSIS — N3946 Mixed incontinence: Secondary | ICD-10-CM

## 2020-02-25 NOTE — Progress Notes (Signed)
02/25/2020 11:02 AM   [name] August 05, 1945 [id number]  Referring provider: Earlie Counts, FNP No address on file  Chief Complaint  Patient presents with  . Over Active Bladder    65mo follow up    HPI: [name]: [name] is a 74 year old male with refractory OAB, bilateral hydroceles and bladder cancer who presents today for follow up.  Oxybutynin XL 15 mg -equivocal results, discontinued due to patient's dementia Myrbetriq 25 mg -equivocal results, cost prohibitive Botox injection 10/2019in OR - at goal Botox injection 08/2018 in OR -lost effectiveness after 3 months Trial of antibiotics 12/2018 -no improvement in symptoms Botox injection 03/2019 in office - ineffective  PTNS 06/25/2019 - 09/10/2019 for 12 weekly treatments - ineffective Gemtesa 75 mg -equivocal results Patient is prescribed Lasix 20 mg 3 times daily, but he admits he does not adhere to the schedule as it causes him to urinate more frequently.He is also taking Mirapex which has a side effect of urinary frequency.  Today Patient has urge incontinence. He says he leaks with coughing sneezing and a small amount when he bends. No bedwetting. Wears 2 pads a day that are moderately wet. Flow was poor. Does not feel empty. Voids every 2-3 hours. Gets up twice at night. 25% improved on the new beta 3 agonist  Patient has refractory urgency incontinence. He is elderly and has medical comorbidities. He has exhausted antimuscarinic medications and has failed Myrbetriq. I recommend for him to stay on the new beta 3 agonist since he has limited other options. He is also failed percutaneous tibial nerve stimulation. The new medication is improving his quality of life. The role of urodynamics discussed. He may not be a good candidate for InterStim but is willing to look into things. He has cardiac comorbidities. Apples and prescription given   On urodynamics patient had not voided and was  catheterized for 200 mL.  Max and bladder capacity was 270 mL.  Bladder was unstable reaching a pressure of 87 cm water.  He had to void off the contraction.  No stress incontinence with a Valsalva pressure 78 cm water.  During voluntary voiding he voided 145 mL.  Maximum flow was 12 mils per second.  Maximum voiding pressure 30 cm water.  Residual is 90 mL.  EMG activity noted during voiding.  Bladder trabeculation noted with diverticuli.  The details of the urodynamics were signed and dictated.  There were a lot of chronic changes to his bladder.  It was almost Christmas tree like  Patient has exhausted his refractory overactive bladder options.  We talked with InterStim recognizing his age and comorbidities.  He just started on Eliquis with his baby aspirin last week for probable past history of left leg clot.  He will speak to his physicians to see if he could stop this for the test and for the implant.  He understands my concerns about his general health.  The beta 3 agonist helps some with his foot on the floor syndrome.  I decided given trospium XR 60 mg 3x11 we gave him a phone number to call.  He would then need medical clearance from my office  TOday Patient could not afford the trospium and failed Vesicare. Patient never filled the trospium due to cost.  He actually stopped the Eliquis 2 weeks ago due to cost.  We stressed to him 3 times today and he said he would call the doctor and see if he needs to be on  it.  He referenced a tick bite that might be associated or not.  I went through InterStim in full detail.  Same template discussed.  He understands the test or insertion of the device cannot be done on blood thinners.  We will double check this when we schedule him.  He understands that if he has to go back on it we would need medical clearance with the time window for him to stay off it.   PMH: Past Medical History:  Diagnosis Date  . Anxiety   . Arteriosclerosis of coronary artery  11/16/2011   Overview:  Stent 10/2011 stent rca 2015 with collaterals to lad which is chronically occluded   . Benign enlargement of prostate   . Benign essential HTN 06/11/2014  . Benign prostatic hypertrophy without urinary obstruction 07/31/2014  . Bilateral cataracts 05/16/2013   Overview:  Dr. [facility]    . Bone spur of foot    Left  . BP (high blood pressure) 11/09/2012  . Cancer ([place])    skin (forehead) and bladder  . Carotid artery narrowing 02/08/2014  . Depression   . Detrusor hypertrophy   . Diabetes ([place])   . Diabetes mellitus, type 2 ([place]) 12/12/2012  . Diverticulosis   . Dyspnea   . Esophageal reflux   . Esophageal reflux   . Fothergill's neuralgia 08/07/2012   Overview:  [facility]   . Gastritis   . GERD (gastroesophageal reflux disease)   . Headache    cluster headaches  . 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 Hearing loss in left ear   . Heart disease   . Hematuria   . Hemorrhoids   . History of hiatal hernia 12/14/2017   small   . Hypercholesteremia   . Lesion of bladder   . Myocardial infarct ([place])   . Presence of stent in coronary artery 11/09/2012  . Rectal bleeding   . Trigeminal neuralgia   . Trigeminal neuralgia   . Valvular heart disease   . Vitamin D deficiency     Surgical History: Past Surgical History:  Procedure Laterality Date  . APPENDECTOMY    . BOTOX INJECTION N/A 12/21/2017   Procedure: Bladder BOTOX INJECTION;  Surgeon: [name], MD;  Location: [facility] ORS;  Service: Urology;  Laterality: N/A;  . BOTOX INJECTION N/A 09/11/2018   Procedure: Bladder BOTOX INJECTION;  Surgeon: [name], MD;  Location: [facility] ORS;  Service: Urology;  Laterality: N/A;  . CARDIAC CATHETERIZATION    . CARDIAC CATHETERIZATION N/A 01/22/2015   Procedure: Left Heart Cath;  Surgeon: Corey Skains, MD;  Location: [place] CV LAB;  Service: Cardiovascular;  Laterality: N/A;  . CARDIAC CATHETERIZATION N/A 01/22/2015   Procedure:  Coronary Stent Intervention;  Surgeon: Isaias Cowman, MD;  Location: [place] CV LAB;  Service: Cardiovascular;  Laterality: N/A;  . CATARACT EXTRACTION, BILATERAL    . COLONOSCOPY WITH PROPOFOL N/A 12/08/2015   Procedure: COLONOSCOPY WITH PROPOFOL;  Surgeon: Lollie Sails, MD;  Location[facility];  Service: Endoscopy;  Laterality: N/A;  . COLONOSCOPY WITH PROPOFOL N/A 12/09/2015   Procedure: COLONOSCOPY WITH PROPOFOL;  Surgeon: Lollie Sails, MD;  Location[facility] ENDOSCOPY;  Service: Endoscopy;  Laterality: N/A;  . CORONARY ANGIOPLASTY     5 stents  . CORONARY STENT PLACEMENT  2015   x5  . CYSTOSCOPY N/A 09/11/2018   Procedure: CYSTOSCOPY;  Surgeon: [name], MD;  Location: [facility] ORS;  Service: Urology;  Laterality: N/A;  . CYSTOSCOPY WITH BIOPSY  N/A 12/21/2017   Procedure: CYSTOSCOPY WITH BIOPSY;  Surgeon: Vanna Scotland, MD;  Location: [facility] ORS;  Service: Urology;  Laterality: N/A;  . ESOPHAGOGASTRODUODENOSCOPY (EGD) WITH PROPOFOL N/A 12/08/2015   Procedure: ESOPHAGOGASTRODUODENOSCOPY (EGD) WITH PROPOFOL;  Surgeon: Christena Deem, MD;  Location[facility] ENDOSCOPY;  Service: Endoscopy;  Laterality: N/A;  . ESOPHAGOGASTRODUODENOSCOPY (EGD) WITH PROPOFOL N/A 12/13/2017   Procedure: ESOPHAGOGASTRODUODENOSCOPY (EGD) WITH PROPOFOL;  Surgeon: Christena Deem, MD;  Location: [facility] ENDOSCOPY;  Service: Endoscopy;  Laterality: N/A;  . EYE SURGERY    . HERNIA REPAIR    . kidney tumor remove    . TRANSURETHRAL RESECTION OF BLADDER TUMOR WITH GYRUS (TURBT-GYRUS)  12/2013  . UMBILICAL HERNIA REPAIR    . urethral meatotomy      Home Medications:  Allergies as of 02/25/2020      Reactions   Ace Inhibitors Cough   Carbamazepine Rash   Other reaction(s): RASH Other reaction(s): RASH   Lyrica [pregabalin] Rash      Medication List       Accurate as of February 25, 2020 11:02 AM. If you have any questions, ask your nurse or doctor.        STOP taking these  medications   gabapentin 100 MG capsule Commonly known as: NEURONTIN Stopped by: Martina Sinner, MD   gabapentin 300 MG capsule Commonly known as: NEURONTIN Stopped by: Martina Sinner, MD   Gemtesa 75 MG Tabs Generic drug: Vibegron Stopped by: Martina Sinner, MD   pravastatin 80 MG tablet Commonly known as: PRAVACHOL Stopped by: Martina Sinner, MD   Trospium Chloride 60 MG Cp24 Stopped by: Martina Sinner, MD     TAKE these medications   albuterol 108 (90 Base) MCG/ACT inhaler Commonly known as: VENTOLIN HFA   apixaban 2.5 MG Tabs tablet Commonly known as: ELIQUIS Take 1 tablet (2.5 mg total) by mouth 2 (two) times daily.   aspirin 81 MG tablet Take 81 mg by mouth daily.   Cholecalciferol 25 MCG (1000 UT) tablet Take 1,000 Units by mouth daily.   citalopram 20 MG tablet Commonly known as: CELEXA Take 20 mg by mouth at bedtime.   Docusate Sodium 100 MG capsule Take by mouth.   furosemide 20 MG tablet Commonly known as: LASIX Take by mouth.   hydrocortisone 2.5 % cream   isosorbide mononitrate 30 MG 24 hr tablet Commonly known as: IMDUR Take 60 mg by mouth daily.   ketoconazole 2 % cream Commonly known as: NIZORAL   multivitamins with iron Tabs tablet Take 1 tablet by mouth daily.   nitroGLYCERIN 0.4 MG SL tablet Commonly known as: NITROSTAT Place under the tongue.   Omega-3 1000 MG Caps Take 1 g by mouth daily. Reported on 03/21/2015   ONE TOUCH ULTRA TEST test strip Generic drug: glucose blood USE 3 (THREE) TIMES DAILY USE AS INSTRUCTED.- ONE TOUCH ULTRA BLOOD GLUCOSE METER STRIPS   OXcarbazepine 150 MG tablet Commonly known as: TRILEPTAL Take 150 mg by mouth at bedtime.   pantoprazole 40 MG tablet Commonly known as: PROTONIX Take 40 mg by mouth daily.   pramipexole 1 MG tablet Commonly known as: MIRAPEX Take 1 mg by mouth daily.   RA Blood Glucose Monitor Devi by Does not apply route.   rosuvastatin 20 MG  tablet Commonly known as: CRESTOR Take by mouth.   sildenafil 50 MG tablet Commonly known as: VIAGRA Take 50 mg by mouth daily as needed for erectile dysfunction.   solifenacin 5  MG tablet Commonly known as: VESICARE Take 1 tablet (5 mg total) by mouth daily.   terbinafine 250 MG tablet Commonly known as: LAMISIL Take by mouth.   valsartan 40 MG tablet Commonly known as: DIOVAN Take 40 mg by mouth daily.       Allergies:  Allergies  Allergen Reactions  . Ace Inhibitors Cough  . Carbamazepine Rash    Other reaction(s): RASH Other reaction(s): RASH   . Lyrica [Pregabalin] Rash    Family History: Family History  Problem Relation Age of Onset  . Kidney cancer Mother   . Prostate cancer Neg Hx     Social History:  reports that he quit smoking about 30 years ago. His smoking use included cigarettes. He has a 80.00 pack-year smoking history. He has never used smokeless tobacco. He reports previous alcohol use. He reports that he does not use drugs.  ROS:                                        Physical Exam: BP 124/66   Pulse 71   Ht 5\' 7"  (1.702 m)   Wt 230 lb (104.3 kg)   BMI 36.02 kg/m   Constitutional:  Alert and oriented, No acute distress.  Laboratory Data: Lab Results  Component Value Date   WBC 7.5 01/23/2015   HGB 13.5 01/23/2015   HCT 40.2 01/23/2015   MCV 76.8 (L) 01/23/2015   PLT 164 01/23/2015    Lab Results  Component Value Date   CREATININE 0.98 01/23/2015    No results found for: PSA  No results found for: TESTOSTERONE  Lab Results  Component Value Date   HGBA1C 6.8 (H) 05/22/2012    Urinalysis    Component Value Date/Time   COLORURINE YELLOW (A) 12/16/2017 1005   APPEARANCEUR Cloudy (A) 12/17/2019 0942   LABSPEC 1.014 12/16/2017 1005   LABSPEC 1.018 12/31/2013 0916   PHURINE 6.0 12/16/2017 1005   GLUCOSEU Negative 12/17/2019 0942   GLUCOSEU Negative 12/31/2013 0916   HGBUR SMALL (A) 12/16/2017  1005   BILIRUBINUR Negative 12/17/2019 0942   BILIRUBINUR Negative 12/31/2013 0916   KETONESUR NEGATIVE 12/16/2017 1005   PROTEINUR Negative 12/17/2019 0942   PROTEINUR NEGATIVE 12/16/2017 1005   NITRITE Negative 12/17/2019 0942   NITRITE NEGATIVE 12/16/2017 1005   LEUKOCYTESUR Negative 12/17/2019 0942   LEUKOCYTESUR Negative 12/31/2013 0916    Pertinent Imaging:   Assessment & Plan: Call patient for test stimulation.  Patient is very motivated in spite of his medical health to try to help his urgency incontinence.  He understands my concern about his medical health but I thought it was a reasonable option and he is reaching the end of his treatment options.  There are no diagnoses linked to this encounter.  No follow-ups on file.  Reece Packer, MD  [facility] [address], [place], [place] [address] [phone]

## 2020-02-27 ENCOUNTER — Telehealth (INDEPENDENT_AMBULATORY_CARE_PROVIDER_SITE_OTHER): Payer: Self-pay

## 2020-02-28 NOTE — Telephone Encounter (Signed)
Pt called an left a VM on the nurses line saying that the NP put him on eliquis he wants to know can he be called in a cheaper option to his pharmacy [facility] in [place] .

## 2020-03-04 NOTE — Telephone Encounter (Signed)
Call in Xarelto.

## 2020-03-04 NOTE — Telephone Encounter (Signed)
Pt called and left VM on the nurses line wanting to know can can his Eliquis be switched to a cheaper medication. Please advise.

## 2020-03-05 NOTE — Telephone Encounter (Signed)
The medication has been called into the pt's pharmacy  per the PA 's instructions. I tried to call the pt to inform them but the mail box was full on the VM so I could no leave VM.

## 2020-03-06 ENCOUNTER — Other Ambulatory Visit: Payer: Self-pay | Admitting: *Deleted

## 2020-03-06 MED ORDER — SOLIFENACIN SUCCINATE 5 MG PO TABS: 5.0000 mg | ORAL_TABLET | Freq: Every day | ORAL | 3 refills | Status: DC

## 2020-03-28 ENCOUNTER — Encounter (INDEPENDENT_AMBULATORY_CARE_PROVIDER_SITE_OTHER): Payer: Self-pay | Admitting: Vascular Surgery

## 2020-03-28 ENCOUNTER — Other Ambulatory Visit: Payer: Self-pay

## 2020-03-28 ENCOUNTER — Ambulatory Visit (INDEPENDENT_AMBULATORY_CARE_PROVIDER_SITE_OTHER): Payer: Medicare HMO | Admitting: Vascular Surgery

## 2020-03-28 VITALS — BP 110/71 | HR 66 | Ht 67.0 in | Wt 229.0 lb

## 2020-03-28 DIAGNOSIS — I890 Lymphedema, not elsewhere classified: Secondary | ICD-10-CM

## 2020-03-28 DIAGNOSIS — I10 Essential (primary) hypertension: Secondary | ICD-10-CM | POA: Diagnosis not present

## 2020-03-28 DIAGNOSIS — E119 Type 2 diabetes mellitus without complications: Secondary | ICD-10-CM

## 2020-03-28 DIAGNOSIS — I6523 Occlusion and stenosis of bilateral carotid arteries: Secondary | ICD-10-CM

## 2020-03-28 DIAGNOSIS — L97221 Non-pressure chronic ulcer of left calf limited to breakdown of skin: Secondary | ICD-10-CM | POA: Diagnosis not present

## 2020-03-28 DIAGNOSIS — M7989 Other specified soft tissue disorders: Secondary | ICD-10-CM

## 2020-03-28 NOTE — Progress Notes (Signed)
MRN : [id number]  [name] is a 75 y.o. (Nov 30, 1945) male who presents with chief complaint of No chief complaint on file. [name]  History of Present Illness: Patient returns today in follow up of his leg swelling and previous ulceration.  He has not had any recurrent ulceration.  He does still have persistent swelling.  He has been wearing compression stockings but they have not really helped much.  His wife thinks they may not be sized well for him.  No fevers or chills.  No chest pain or shortness of breath.  Current Outpatient Medications  Medication Sig Dispense Refill  . albuterol (VENTOLIN HFA) 108 (90 Base) MCG/ACT inhaler     . apixaban (ELIQUIS) 2.5 MG TABS tablet Take 1 tablet (2.5 mg total) by mouth 2 (two) times daily. 60 tablet 2  . aspirin 81 MG tablet Take 81 mg by mouth daily.    . Blood Glucose Monitoring Suppl (RA BLOOD GLUCOSE MONITOR) DEVI by Does not apply route.    . Cholecalciferol 25 MCG (1000 UT) tablet Take 1,000 Units by mouth daily.     . citalopram (CELEXA) 20 MG tablet Take 20 mg by mouth at bedtime.     Mariane Baumgarten Sodium 100 MG capsule Take by mouth.    . hydrocortisone 2.5 % cream     . isosorbide mononitrate (IMDUR) 30 MG 24 hr tablet Take 60 mg by mouth daily.     [name] ketoconazole (NIZORAL) 2 % cream     . Multiple Vitamins-Iron (MULTIVITAMINS WITH IRON) TABS tablet Take 1 tablet by mouth daily.    . nitroGLYCERIN (NITROSTAT) 0.4 MG SL tablet Place under the tongue.    . Omega-3 1000 MG CAPS Take 1 g by mouth daily. Reported on 03/21/2015    . ONE TOUCH ULTRA TEST test strip USE 3 (THREE) TIMES DAILY USE AS INSTRUCTED.- ONE TOUCH ULTRA BLOOD GLUCOSE METER STRIPS    . OXcarbazepine (TRILEPTAL) 150 MG tablet Take 150 mg by mouth at bedtime.     . pantoprazole (PROTONIX) 40 MG tablet Take 40 mg by mouth daily.    . pramipexole (MIRAPEX) 1 MG tablet Take 1 mg by mouth daily.    . rosuvastatin (CRESTOR) 20 MG tablet Take by mouth.    . sildenafil (VIAGRA) 50  MG tablet Take 50 mg by mouth daily as needed for erectile dysfunction.    . solifenacin (VESICARE) 5 MG tablet Take 1 tablet (5 mg total) by mouth daily. 90 tablet 3  . terbinafine (LAMISIL) 250 MG tablet Take by mouth.    . valsartan (DIOVAN) 40 MG tablet Take 40 mg by mouth daily.    . furosemide (LASIX) 20 MG tablet Take by mouth.     No current facility-administered medications for this visit.    Past Medical History:  Diagnosis Date  . Anxiety   . Arteriosclerosis of coronary artery 11/16/2011   Overview:  Stent 10/2011 stent rca 2015 with collaterals to lad which is chronically occluded   . Benign enlargement of prostate   . Benign essential HTN 06/11/2014  . Benign prostatic hypertrophy without urinary obstruction 07/31/2014  . Bilateral cataracts 05/16/2013   Overview:  Dr. [facility]    . Bone spur of foot    Left  . BP (high blood pressure) 11/09/2012  . Cancer ([place])    skin (forehead) and bladder  . Carotid artery narrowing 02/08/2014  . Depression   . Detrusor hypertrophy   .  Diabetes ([place])   . Diabetes mellitus, type 2 ([place]) 12/12/2012  . Diverticulosis   . Dyspnea   . Esophageal reflux   . Esophageal reflux   . Fothergill's neuralgia 08/07/2012   Overview:  [facility]   . Gastritis   . GERD (gastroesophageal reflux disease)   . Headache    cluster headaches  . 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 Hearing loss in left ear   . Heart disease   . Hematuria   . Hemorrhoids   . History of hiatal hernia 12/14/2017   small   . Hypercholesteremia   . Lesion of bladder   . Myocardial infarct ([place])   . Presence of stent in coronary artery 11/09/2012  . Rectal bleeding   . Trigeminal neuralgia   . Trigeminal neuralgia   . Valvular heart disease   . Vitamin D deficiency     Past Surgical History:  Procedure Laterality Date  . APPENDECTOMY    . BOTOX INJECTION N/A 12/21/2017   Procedure: Bladder BOTOX INJECTION;  Surgeon: [name], MD;   Location: [facility] ORS;  Service: Urology;  Laterality: N/A;  . BOTOX INJECTION N/A 09/11/2018   Procedure: Bladder BOTOX INJECTION;  Surgeon: [name], MD;  Location: [facility];  Service: Urology;  Laterality: N/A;  . CARDIAC CATHETERIZATION    . CARDIAC CATHETERIZATION N/A 01/22/2015   Procedure: Left Heart Cath;  Surgeon: Corey Skains, MD;  Location: [place] [facility];  Service: Cardiovascular;  Laterality: N/A;  . CARDIAC CATHETERIZATION N/A 01/22/2015   Procedure: Coronary Stent Intervention;  Surgeon: Isaias Cowman, MD;  Location: [place] [facility];  Service: Cardiovascular;  Laterality: N/A;  . CATARACT EXTRACTION, BILATERAL    . COLONOSCOPY WITH PROPOFOL N/A 12/08/2015   Procedure: COLONOSCOPY WITH PROPOFOL;  Surgeon: Lollie Sails, MD;  Location[facility] END[facility]COPY;  Service: Endoscopy;  Laterality: N/A;  . COLONOSCOPY WITH PROPOFOL N/A 12/09/2015   Procedure: COLONOSCOPY WITH PROPOFOL;  Surgeon: Lollie Sails, MD;  Location: [facility] ENDOSCOPY;  Service: Endoscopy;  Laterality: N/A;  . CORONARY ANGIOPLASTY     5 stents  . CORONARY STENT PLACEMENT  2015   x5  . CYSTOSCOPY N/A 09/11/2018   Procedure: CYSTOSCOPY;  Surgeon: [name], MD;  Location: [facility] ORS;  Service: Urology;  Laterality: N/A;  . CYSTOSCOPY WITH BIOPSY N/A 12/21/2017   Procedure: CYSTOSCOPY WITH BIOPSY;  Surgeon: [name], MD;  Location: [facility] ORS;  Service: Urology;  Laterality: N/A;  . ESOPHAGOGASTRODUODENOSCOPY (EGD) WITH PROPOFOL N/A 12/08/2015   Procedure: ESOPHAGOGASTRODUODENOSCOPY (EGD) WITH PROPOFOL;  Surgeon: Lollie Sails, MD;  Location: [facility] ENDOSCOPY;  Service: Endoscopy;  Laterality: N/A;  . ESOPHAGOGASTRODUODENOSCOPY (EGD) WITH PROPOFOL N/A 12/13/2017   Procedure: ESOPHAGOGASTRODUODENOSCOPY (EGD) WITH PROPOFOL;  Surgeon: Lollie Sails, MD;  Location: [facility] ENDOSCOPY;  Service: Endoscopy;  Laterality: N/A;  . EYE SURGERY    . HERNIA REPAIR    . kidney tumor remove     . TRANSURETHRAL RESECTION OF BLADDER TUMOR WITH GYRUS (TURBT-GYRUS)  12/2013  . UMBILICAL HERNIA REPAIR    . urethral meatotomy       Social History   Tobacco Use  . Smoking status: Former Smoker    Packs/day: 2.00    Years: 40.00    Pack years: 80.00    Types: Cigarettes    Quit date: 07/30/1989    Years since quitting: 30.6  . Smokeless tobacco: Never Used  Vaping Use  . Vaping Use: Never used  Substance Use Topics  .  Alcohol use: Not Currently    Alcohol/week: 0.0 standard drinks    Comment: rarely  . Drug use: No      Family History  Problem Relation Age of Onset  . Kidney cancer Mother   . Prostate cancer Neg Hx   no bleeding or clotting disorders  Allergies  Allergen Reactions  . Ace Inhibitors Cough  . Carbamazepine Rash    Other reaction(s): RASH Other reaction(s): RASH   . Lyrica [Pregabalin] Rash     REVIEW OF SYSTEMS (Negative unless checked)  Constitutional: [] ?Weight loss  [] ?Fever  [] ?Chills Cardiac: [] ?Chest pain   [] ?Chest pressure   [] ?Palpitations   [] ?Shortness of breath when laying flat   [] ?Shortness of breath at rest   [] ?Shortness of breath with exertion. Vascular:  [] ?Pain in legs with walking   [] ?Pain in legs at rest   [] ?Pain in legs when laying flat   [] ?Claudication   [] ?Pain in feet when walking  [] ?Pain in feet at rest  [] ?Pain in feet when laying flat   [] ?History of DVT   [] ?Phlebitis   [x] ?Swelling in legs   [] ?Varicose veins   [x] ?Non-healing ulcers Pulmonary:   [] ?Uses home oxygen   [] ?Productive cough   [] ?Hemoptysis   [] ?Wheeze  [] ?COPD   [] ?Asthma Neurologic:  [] ?Dizziness  [] ?Blackouts   [] ?Seizures   [] ?History of stroke   [] ?History of TIA  [] ?Aphasia   [] ?Temporary blindness   [] ?Dysphagia   [] ?Weakness or numbness in arms   [] ?Weakness or numbness in legs Musculoskeletal:  [x] ?Arthritis   [] ?Joint swelling   [] ?Joint pain   [] ?Low back pain Hematologic:  [] ?Easy bruising  [] ?Easy bleeding   [] ?Hypercoagulable state    [] ?Anemic  [] ?Hepatitis Gastrointestinal:  [] ?Blood in stool   [] ?Vomiting blood  [x] ?Gastroesophageal reflux/heartburn   [] ?Abdominal pain Genitourinary:  [] ?Chronic kidney disease   [] ?Difficult urination  [] ?Frequent urination  [] ?Burning with urination   [] ?Hematuria Skin:  [] ?Rashes   [x] ?Ulcers   [x] ?Wounds Psychological:  [x] ?History of anxiety   [] ? History of major depression.  Physical Examination  BP 110/71   Pulse 66   Ht 5\' 7"  (1.702 m)   Wt 229 lb (103.9 kg)   BMI 35.87 kg/m  Gen:  WD/WN, NAD Head: [place]/AT, No temporalis wasting. Ear/Nose/Throat: Hearing grossly intact, nares w/o erythema or drainage Eyes: Conjunctiva clear. Sclera non-icteric Neck: Supple.  Trachea midline Pulmonary:  Good air movement, no use of accessory muscles.  Cardiac: RRR, no JVD Vascular:  Vessel Right Left  Radial Palpable Palpable                       Musculoskeletal: M/S 5/5 throughout.  No deformity or atrophy.  1-2+ bilateral lower extremity edema. Neurologic: Sensation grossly intact in extremities.  Symmetrical.  Speech is fluent.  Psychiatric: Judgment intact, Mood & affect appropriate for pt's clinical situation. Dermatologic: No rashes or ulcers noted.  No cellulitis or open wounds.      Labs No results found for this or any previous visit (from the past 2160 hour(s)).  Radiology No results found.  Assessment/Plan Carotid artery narrowing Checked last year and 1-49% bilaterally  Benign essential HTN blood pressure control important in reducing the progression of atherosclerotic disease. On appropriate oral medications.   Diet-controlled type 2 diabetes mellitus (HCC) blood glucose control important in reducing the progression of atherosclerotic disease. Also, involved in wound healing. On appropriate medications.  Swelling of limb His current compression socks did not seem to be doing  a very good job of controlling his swelling.  He seems to have developed  lymphedema from chronic scarring and lymphatic channels.  I will write him a new prescription for compression stockings and he may need a different size.  I also think that the addition of a lymphedema pump would be a good adjuvant therapy to improve his symptoms.  He has at least stage II lymphedema with swelling refractory to impression and elevation.  We will reassess his situation in 3 months or so.  Lymphedema His current compression socks did not seem to be doing a very good job of controlling his swelling.  He seems to have developed lymphedema from chronic scarring and lymphatic channels.  I will write him a new prescription for compression stockings and he may need a different size.  I also think that the addition of a lymphedema pump would be a good adjuvant therapy to improve his symptoms.  He has at least stage II lymphedema with swelling refractory to impression and elevation.  We will reassess his situation in 3 months or so.    Leotis Pain, MD  03/28/2020 11:01 AM    This note was created with Dragon medical transcription system.  Any errors from dictation are purely unintentional

## 2020-03-28 NOTE — Assessment & Plan Note (Signed)
His current compression socks did not seem to be doing a very good job of controlling his swelling.  He seems to have developed lymphedema from chronic scarring and lymphatic channels.  I will write him a new prescription for compression stockings and he may need a different size.  I also think that the addition of a lymphedema pump would be a good adjuvant therapy to improve his symptoms.  He has at least stage II lymphedema with swelling refractory to impression and elevation.  We will reassess his situation in 3 months or so. 

## 2020-03-28 NOTE — Patient Instructions (Signed)
Lymphedema  Lymphedema is swelling that is caused by the abnormal collection of lymph in the tissues under the skin. Lymph is excess fluid from the tissues in your body that is removed through the lymphatic system. This system is part of your body's defense system (immune system) and includes lymph nodes and lymph vessels. The lymph vessels collect and carry the excess fluid, fats, proteins, and waste from the tissues of the body to the bloodstream. This system also works to clean and remove bacteria and waste products from the body. Lymphedema occurs when the lymphatic system is blocked. When the lymph vessels or lymph nodes are blocked or damaged, lymph does not drain properly. This causes an abnormal buildup of lymph, which leads to swelling in the affected area. This may include the trunk area, or an arm or leg. Lymphedema cannot be cured by medicines, but various methods can be used to help reduce the swelling. What are the causes? The cause of this condition depends on the type of lymphedema that you have.  Primary lymphedema is caused by the absence of lymph vessels or having abnormal lymph vessels at birth.  Secondary lymphedema occurs when lymph vessels are blocked or damaged. Secondary lymphedema is more common. Common causes of lymph vessel blockage include: ? Skin infection, such as cellulitis. ? Infection by parasites (filariasis). ? Injury. ? Radiation therapy. ? Cancer. ? Formation of scar tissue. ? Surgery. What are the signs or symptoms? Symptoms of this condition include:  Swelling of the arm or leg.  A heavy or tight feeling in the arm or leg.  Swelling of the feet, toes, or fingers. Shoes or rings may fit more tightly than before.  Redness of the skin over the affected area.  Limited movement of the affected limb.  Sensitivity to touch or discomfort in the affected limb. How is this diagnosed? This condition may be diagnosed based on:  Your symptoms and medical  history.  A physical exam.  Bioimpedance spectroscopy. In this test, painless electrical currents are used to measure fluid levels in your body.  Imaging tests, such as: ? MRI. ? CT scan. ? Duplex ultrasound. This test uses sound waves to produce images of the vessels and the blood flow on a screen. ? Lymphoscintigraphy. In this test, a low dose of a radioactive substance is injected to trace the flow of lymph through your lymph vessels. ? Lymphangiography. In this test, a contrast dye is injected into the lymph vessel to help show blockages. How is this treated? If an underlying condition is causing the lymphedema, that condition will be treated. For example, antibiotic medicines may be used to treat an infection. Treatment for this condition will depend on the cause of your lymphedema. Treatment may include:  Complete decongestive therapy (CDT). This is done by a certified lymphedema therapist to reduce fluid congestion. This therapy includes: ? Skin care. ? Compression wrapping of the affected area. ? Manual lymph drainage. This is a special massage technique that promotes lymph drainage out of a limb. ? Specific exercises. Certain exercises can help fluid move out of the affected limb.  Compression. Various methods may be used to apply pressure to the affected limb to reduce the swelling. They include: ? Wearing compression stockings or sleeves on the affected limb. ? Wrapping the affected limb with special bandages.  Surgery. This is usually done for severe cases only. For example, surgery may be done if you have trouble moving the limb or if the swelling does not  get better with other treatments.   Follow these instructions at home: Self-care  The affected area is more likely to become injured or infected. Take these steps to help prevent infection: ? Keep the affected area clean and dry. ? Use approved creams or lotions to keep the skin moisturized. ? Protect your skin from  cuts:  Use gloves while cooking or gardening.  Do not walk barefoot.  If you shave the affected area, use an Copy.  Do not wear tight clothes, shoes, or jewelry.  Eat a healthy diet that includes a lot of fruits and vegetables. Activity  Do exercises as told by your health care provider.  Do not sit with your legs crossed.  When possible, keep the affected limb raised (elevated) above the level of your heart.  Avoid carrying things with an arm that is affected by lymphedema. General instructions  Wear compression stockings or sleeves as told by your health care provider.  Note any changes in size of the affected limb. You may be instructed to take regular measurements and keep track of them.  Take over-the-counter and prescription medicines only as told by your health care provider.  If you were prescribed an antibiotic medicine, take or apply it as told by your health care provider. Do not stop using the antibiotic even if you start to feel better or if your condition improves.  Do not use heating pads or ice packs on the affected area.  Avoid having blood draws, IV insertions, or blood pressure checks on the affected limb.  Keep all follow-up visits. This is important. Contact a health care provider if you:  Continue to have swelling in your limb.  Have fluid leaking from the skin of your swollen limb.  Have a cut that does not heal.  Have redness or pain in the affected area.  Develop purplish spots, rash, blisters, or sores (lesions) on your affected limb. Get help right away if you:  Have new swelling in your limb that starts suddenly.  Have shortness of breath or chest pain.  Have a fever or chills. These symptoms may represent a serious problem that is an emergency. Do not wait to see if the symptoms will go away. Get medical help right away. Call your local emergency services (911 in the [place] Do not drive yourself to the  hospital. Summary  Lymphedema is swelling that is caused by the abnormal collection of lymph in the tissues under the skin.  Lymph is fluid from the tissues in your body that is removed through the lymphatic system. This system collects and carries excess fluid, fats, proteins, and wastes from the tissues of the body to the bloodstream.  Lymphedema causes swelling, pain, and redness in the affected area. This may include the trunk area, or an arm or leg.  Treatment for this condition may depend on the cause of your lymphedema. Treatment may include treating the underlying cause, complete decongestive therapy (CDT), compression methods, or surgery. This information is not intended to replace advice given to you by your health care provider. Make sure you discuss any questions you have with your health care provider. Document Revised: 12/12/2019 Document Reviewed: 12/12/2019 Elsevier Patient Education  2021 [employer].

## 2020-03-28 NOTE — Assessment & Plan Note (Signed)
His current compression socks did not seem to be doing a very good job of controlling his swelling.  He seems to have developed lymphedema from chronic scarring and lymphatic channels.  I will write him a new prescription for compression stockings and he may need a different size.  I also think that the addition of a lymphedema pump would be a good adjuvant therapy to improve his symptoms.  He has at least stage II lymphedema with swelling refractory to impression and elevation.  We will reassess his situation in 3 months or so.

## 2020-04-11 DIAGNOSIS — E6609 Other obesity due to excess calories: Secondary | ICD-10-CM | POA: Insufficient documentation

## 2020-05-12 ENCOUNTER — Ambulatory Visit: Payer: Medicare HMO | Admitting: Urology

## 2020-05-12 ENCOUNTER — Ambulatory Visit: Payer: Self-pay | Admitting: Urology

## 2020-05-12 ENCOUNTER — Other Ambulatory Visit: Payer: Self-pay

## 2020-05-12 ENCOUNTER — Encounter: Payer: Self-pay | Admitting: Urology

## 2020-05-12 VITALS — BP 113/68 | HR 68 | Ht 67.0 in | Wt 234.0 lb

## 2020-05-12 DIAGNOSIS — N3946 Mixed incontinence: Secondary | ICD-10-CM

## 2020-05-12 MED ORDER — OXYBUTYNIN CHLORIDE ER 10 MG PO TB24: 10.0000 mg | ORAL_TABLET | Freq: Every day | ORAL | 3 refills | Status: DC

## 2020-05-12 NOTE — Progress Notes (Signed)
05/12/2020 10:17 AM   [name] 1945/06/28 [id number]  Referring provider: Earlie Counts, FNP No address on file  No chief complaint on file.   HPI: Reviewed note.  Patient has failed many medications.  He is failed third line therapies.  He failed the test stimulation in my office.  He has failed Botox and percutaneous tibial nerve stimulation.  His medical comorbidities.  He wears 2-3 pads a day moderately wet.  He had mild benefit from the peripheral nerve evaluation as noted.  I called in trospium.  Patient in the past or now is never filled it.  He wants go back on oxybutynin since this helped a little bit in the past.  Clinically not infected.  Test emulation did not work.  Still has urge incontinence   PMH: Past Medical History:  Diagnosis Date  . Anxiety   . Arteriosclerosis of coronary artery 11/16/2011   Overview:  Stent 10/2011 stent rca 2015 with collaterals to lad which is chronically occluded   . Benign enlargement of prostate   . Benign essential HTN 06/11/2014  . Benign prostatic hypertrophy without urinary obstruction 07/31/2014  . Bilateral cataracts 05/16/2013   Overview:  [facility]. [facility]    . Bone spur of foot    Left  . BP (high blood pressure) 11/09/2012  . Cancer ([place])    skin (forehead) and bladder  . Carotid artery narrowing 02/08/2014  . Depression   . Detrusor hypertrophy   . Diabetes ([place])   . Diabetes mellitus, type 2 ([place]) 12/12/2012  . Diverticulosis   . Dyspnea   . Esophageal reflux   . Esophageal reflux   . Fothergill's neuralgia 08/07/2012   Overview:  [facility] Neurology   . Gastritis   . GERD (gastroesophageal reflux disease)   . Headache    cluster headaches  . 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 Hearing loss in left ear   . Heart disease   . Hematuria   . Hemorrhoids   . History of hiatal hernia 12/14/2017   small   . Hypercholesteremia   . Lesion of bladder   . Myocardial infarct ([place])   . Presence of stent in  coronary artery 11/09/2012  . Rectal bleeding   . Trigeminal neuralgia   . Trigeminal neuralgia   . Valvular heart disease   . Vitamin D deficiency     Surgical History: Past Surgical History:  Procedure Laterality Date  . APPENDECTOMY    . BOTOX INJECTION N/A 12/21/2017   Procedure: Bladder BOTOX INJECTION;  Surgeon: [name], MD;  Location: [facility] ORS;  Service: Urology;  Laterality: N/A;  . BOTOX INJECTION N/A 09/11/2018   Procedure: Bladder BOTOX INJECTION;  Surgeon: [name], MD;  Location: [facility] ORS;  Service: Urology;  Laterality: N/A;  . CARDIAC CATHETERIZATION    . CARDIAC CATHETERIZATION N/A 01/22/2015   Procedure: Left Heart Cath;  Surgeon: Corey Skains, MD;  Location: [place] CV LAB;  Service: Cardiovascular;  Laterality: N/A;  . CARDIAC CATHETERIZATION N/A 01/22/2015   Procedure: Coronary Stent Intervention;  Surgeon: Isaias Cowman, MD;  Location: [place] CV LAB;  Service: Cardiovascular;  Laterality: N/A;  . CATARACT EXTRACTION, BILATERAL    . COLONOSCOPY WITH PROPOFOL N/A 12/08/2015   Procedure: COLONOSCOPY WITH PROPOFOL;  Surgeon: Lollie Sails, MD;  Location: [facility] ENDOSCOPY;  Service: Endoscopy;  Laterality: N/A;  . COLONOSCOPY WITH PROPOFOL N/A 12/09/2015   Procedure: COLONOSCOPY WITH PROPOFOL;  Surgeon: Lollie Sails, MD;  Location: [facility] ENDOSCOPY;  Service: Endoscopy;  Laterality: N/A;  . CORONARY ANGIOPLASTY     5 stents  . CORONARY STENT PLACEMENT  2015   x5  . CYSTOSCOPY N/A 09/11/2018   Procedure: CYSTOSCOPY;  Surgeon: [name], MD;  Location: [facility] ORS;  Service: Urology;  Laterality: N/A;  . CYSTOSCOPY WITH BIOPSY N/A 12/21/2017   Procedure: CYSTOSCOPY WITH BIOPSY;  Surgeon: [name], MD;  Location: [facility] ORS;  Service: Urology;  Laterality: N/A;  . ESOPHAGOGASTRODUODENOSCOPY (EGD) WITH PROPOFOL N/A 12/08/2015   Procedure: ESOPHAGOGASTRODUODENOSCOPY (EGD) WITH PROPOFOL;  Surgeon: Lollie Sails, MD;   Location: [facility] ENDOSCOPY;  Service: Endoscopy;  Laterality: N/A;  . ESOPHAGOGASTRODUODENOSCOPY (EGD) WITH PROPOFOL N/A 12/13/2017   Procedure: ESOPHAGOGASTRODUODENOSCOPY (EGD) WITH PROPOFOL;  Surgeon: Lollie Sails, MD;  Location: [facility] ENDOSCOPY;  Service: Endoscopy;  Laterality: N/A;  . EYE SURGERY    . HERNIA REPAIR    . kidney tumor remove    . TRANSURETHRAL RESECTION OF BLADDER TUMOR WITH GYRUS (TURBT-GYRUS)  12/2013  . UMBILICAL HERNIA REPAIR    . urethral meatotomy      Home Medications:  Allergies as of 05/12/2020      Reactions   Ace Inhibitors Cough   Carbamazepine Rash   Other reaction(s): RASH Other reaction(s): RASH   Lyrica [pregabalin] Rash      Medication List       Accurate as of May 12, 2020 10:17 AM. If you have any questions, ask your nurse or doctor.        albuterol 108 (90 Base) MCG/ACT inhaler Commonly known as: VENTOLIN HFA   apixaban 2.5 MG Tabs tablet Commonly known as: ELIQUIS Take 1 tablet (2.5 mg total) by mouth 2 (two) times daily.   aspirin 81 MG tablet Take 81 mg by mouth daily.   Cholecalciferol 25 MCG (1000 UT) tablet Take 1,000 Units by mouth daily.   citalopram 20 MG tablet Commonly known as: CELEXA Take 20 mg by mouth at bedtime.   Docusate Sodium 100 MG capsule Take by mouth.   furosemide 20 MG tablet Commonly known as: LASIX Take by mouth.   hydrocortisone 2.5 % cream   isosorbide mononitrate 30 MG 24 hr tablet Commonly known as: IMDUR Take 60 mg by mouth daily.   ketoconazole 2 % cream Commonly known as: NIZORAL   multivitamins with iron Tabs tablet Take 1 tablet by mouth daily.   nitroGLYCERIN 0.4 MG SL tablet Commonly known as: NITROSTAT Place under the tongue.   Omega-3 1000 MG Caps Take 1 g by mouth daily. Reported on 03/21/2015   ONE TOUCH ULTRA TEST test strip Generic drug: glucose blood USE 3 (THREE) TIMES DAILY USE AS INSTRUCTED.- ONE TOUCH ULTRA BLOOD GLUCOSE METER STRIPS   OXcarbazepine  150 MG tablet Commonly known as: TRILEPTAL Take 150 mg by mouth at bedtime.   pantoprazole 40 MG tablet Commonly known as: PROTONIX Take 40 mg by mouth daily.   pramipexole 1 MG tablet Commonly known as: MIRAPEX Take 1 mg by mouth daily.   RA Blood Glucose Monitor Devi by Does not apply route.   rosuvastatin 20 MG tablet Commonly known as: CRESTOR Take by mouth.   sildenafil 50 MG tablet Commonly known as: VIAGRA Take 50 mg by mouth daily as needed for erectile dysfunction.   solifenacin 5 MG tablet Commonly known as: VESICARE Take 1 tablet (5 mg total) by mouth daily.   terbinafine 250 MG tablet Commonly known as: LAMISIL Take by mouth.   valsartan  40 MG tablet Commonly known as: DIOVAN Take 40 mg by mouth daily.       Allergies:  Allergies  Allergen Reactions  . Ace Inhibitors Cough  . Carbamazepine Rash    Other reaction(s): RASH Other reaction(s): RASH   . Lyrica [Pregabalin] Rash    Family History: Family History  Problem Relation Age of Onset  . Kidney cancer Mother   . Prostate cancer Neg Hx     Social History:  reports that he quit smoking about 30 years ago. His smoking use included cigarettes. He has a 80.00 pack-year smoking history. He has never used smokeless tobacco. He reports previous alcohol use. He reports that he does not use drugs.  ROS:                                        Physical Exam: There were no vitals taken for this visit.  Constitutional:  Alert and oriented, No acute distress.  Laboratory Data: Lab Results  Component Value Date   WBC 7.5 01/23/2015   HGB 13.5 01/23/2015   HCT 40.2 01/23/2015   MCV 76.8 (L) 01/23/2015   PLT 164 01/23/2015    Lab Results  Component Value Date   CREATININE 0.98 01/23/2015    No results found for: PSA  No results found for: TESTOSTERONE  Lab Results  Component Value Date   HGBA1C 6.8 (H) 05/22/2012    Urinalysis    Component Value Date/Time    COLORURINE YELLOW (A) 12/16/2017 1005   APPEARANCEUR Cloudy (A) 12/17/2019 0942   LABSPEC 1.014 12/16/2017 1005   LABSPEC 1.018 12/31/2013 0916   PHURINE 6.0 12/16/2017 1005   GLUCOSEU Negative 12/17/2019 0942   GLUCOSEU Negative 12/31/2013 0916   HGBUR SMALL (A) 12/16/2017 1005   BILIRUBINUR Negative 12/17/2019 0942   BILIRUBINUR Negative 12/31/2013 0916   KETONESUR NEGATIVE 12/16/2017 1005   PROTEINUR Negative 12/17/2019 0942   PROTEINUR NEGATIVE 12/16/2017 1005   NITRITE Negative 12/17/2019 0942   NITRITE NEGATIVE 12/16/2017 1005   LEUKOCYTESUR Negative 12/17/2019 0942   LEUKOCYTESUR Negative 12/31/2013 0916    Pertinent Imaging:   Assessment & Plan: Prescription 90x3 of oxybutynin sent and I will see near  There are no diagnoses linked to this encounter.  No follow-ups on file.  Reece Packer, MD  [place] [address] [place],  [address] [phone]

## 2020-06-27 ENCOUNTER — Encounter (INDEPENDENT_AMBULATORY_CARE_PROVIDER_SITE_OTHER): Payer: Self-pay | Admitting: Vascular Surgery

## 2020-06-27 ENCOUNTER — Ambulatory Visit (INDEPENDENT_AMBULATORY_CARE_PROVIDER_SITE_OTHER): Payer: Medicare HMO | Admitting: Vascular Surgery

## 2020-06-27 ENCOUNTER — Other Ambulatory Visit: Payer: Self-pay

## 2020-06-27 VITALS — BP 130/73 | HR 63 | Resp 16 | Wt 228.0 lb

## 2020-06-27 DIAGNOSIS — E119 Type 2 diabetes mellitus without complications: Secondary | ICD-10-CM | POA: Diagnosis not present

## 2020-06-27 DIAGNOSIS — I10 Essential (primary) hypertension: Secondary | ICD-10-CM

## 2020-06-27 DIAGNOSIS — I6523 Occlusion and stenosis of bilateral carotid arteries: Secondary | ICD-10-CM | POA: Diagnosis not present

## 2020-06-27 DIAGNOSIS — M7989 Other specified soft tissue disorders: Secondary | ICD-10-CM | POA: Diagnosis not present

## 2020-06-27 DIAGNOSIS — I890 Lymphedema, not elsewhere classified: Secondary | ICD-10-CM

## 2020-06-27 NOTE — Assessment & Plan Note (Signed)
Much improved.  Has been wearing stockings.  Now wearing diabetic socks.  Continue elevation in activity.  Recheck as needed.

## 2020-06-27 NOTE — Progress Notes (Signed)
MRN : [id number]  [name] is a 75 y.o. (March 05, 1945) male who presents with chief complaint of  Chief Complaint  Patient presents with  . Follow-up    3 month follow up  .  History of Present Illness: Patient returns today in follow up of his leg swelling.  It is much better.  He has been elevating his legs and walking more.  Has been wearing socks.  He is pleased with the results  Current Outpatient Medications  Medication Sig Dispense Refill  . albuterol (VENTOLIN HFA) 108 (90 Base) MCG/ACT inhaler     . apixaban (ELIQUIS) 2.5 MG TABS tablet Take 1 tablet (2.5 mg total) by mouth 2 (two) times daily. 60 tablet 2  . aspirin 81 MG tablet Take 81 mg by mouth daily.    . Blood Glucose Monitoring Suppl (RA BLOOD GLUCOSE MONITOR) DEVI by Does not apply route.    . Cholecalciferol 25 MCG (1000 UT) tablet Take 1,000 Units by mouth daily.     . citalopram (CELEXA) 20 MG tablet Take 20 mg by mouth at bedtime.     Mariane Baumgarten Sodium 100 MG capsule Take by mouth.    . hydrocortisone 2.5 % cream     . isosorbide mononitrate (IMDUR) 30 MG 24 hr tablet Take 60 mg by mouth daily.     [name] ketoconazole (NIZORAL) 2 % cream     . Multiple Vitamins-Iron (MULTIVITAMINS WITH IRON) TABS tablet Take 1 tablet by mouth daily.    . nitroGLYCERIN (NITROSTAT) 0.4 MG SL tablet Place under the tongue.    . Omega-3 1000 MG CAPS Take 1 g by mouth daily. Reported on 03/21/2015    . ONE TOUCH ULTRA TEST test strip USE 3 (THREE) TIMES DAILY USE AS INSTRUCTED.- ONE TOUCH ULTRA BLOOD GLUCOSE METER STRIPS    . OXcarbazepine (TRILEPTAL) 150 MG tablet Take 150 mg by mouth at bedtime.     [name] oxybutynin (DITROPAN-XL) 10 MG 24 hr tablet Take 1 tablet (10 mg total) by mouth daily. 90 tablet 3  . pantoprazole (PROTONIX) 40 MG tablet Take 40 mg by mouth daily.    . pramipexole (MIRAPEX) 1 MG tablet Take 1 mg by mouth daily.    . rosuvastatin (CRESTOR) 20 MG tablet Take by mouth.    . sildenafil (VIAGRA) 50 MG tablet Take 50 mg  by mouth daily as needed for erectile dysfunction.    . solifenacin (VESICARE) 5 MG tablet Take 1 tablet (5 mg total) by mouth daily. 90 tablet 3  . terbinafine (LAMISIL) 250 MG tablet Take by mouth.    . valsartan (DIOVAN) 40 MG tablet Take 40 mg by mouth daily.    . furosemide (LASIX) 20 MG tablet Take by mouth.     No current facility-administered medications for this visit.    Past Medical History:  Diagnosis Date  . Anxiety   . Arteriosclerosis of coronary artery 11/16/2011   Overview:  Stent 10/2011 stent rca 2015 with collaterals to lad which is chronically occluded   . Benign enlargement of prostate   . Benign essential HTN 06/11/2014  . Benign prostatic hypertrophy without urinary obstruction 07/31/2014  . Bilateral cataracts 05/16/2013   Overview:  [facility]    . Bone spur of foot    Left  . BP (high blood pressure) 11/09/2012  . Cancer ([place])    skin (forehead) and bladder  . Carotid artery narrowing 02/08/2014  . Depression   . Detrusor  hypertrophy   . Diabetes ([place])   . Diabetes mellitus, type 2 ([place]) 12/12/2012  . Diverticulosis   . Dyspnea   . Esophageal reflux   . Esophageal reflux   . Fothergill's neuralgia 08/07/2012   Overview:  [facility] Neurology   . Gastritis   . GERD (gastroesophageal reflux disease)   . Headache    cluster headaches  . 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 Hearing loss in left ear   . Heart disease   . Hematuria   . Hemorrhoids   . History of hiatal hernia 12/14/2017   small   . Hypercholesteremia   . Lesion of bladder   . Myocardial infarct ([place])   . Presence of stent in coronary artery 11/09/2012  . Rectal bleeding   . Trigeminal neuralgia   . Trigeminal neuralgia   . Valvular heart disease   . Vitamin D deficiency     Past Surgical History:  Procedure Laterality Date  . APPENDECTOMY    . BOTOX INJECTION N/A 12/21/2017   Procedure: Bladder BOTOX INJECTION;  Surgeon: [name], MD;  Location: [facility] ORS;   Service: Urology;  Laterality: N/A;  . BOTOX INJECTION N/A 09/11/2018   Procedure: Bladder BOTOX INJECTION;  Surgeon: [name], MD;  Location: [facility] ORS;  Service: Urology;  Laterality: N/A;  . CARDIAC CATHETERIZATION    . CARDIAC CATHETERIZATION N/A 01/22/2015   Procedure: Left Heart Cath;  Surgeon: Corey Skains, MD;  Location: [place] CV LAB;  Service: Cardiovascular;  Laterality: N/A;  . CARDIAC CATHETERIZATION N/A 01/22/2015   Procedure: Coronary Stent Intervention;  Surgeon: Isaias Cowman, MD;  Location: [place] CV LAB;  Service: Cardiovascular;  Laterality: N/A;  . CATARACT EXTRACTION, BILATERAL    . COLONOSCOPY WITH PROPOFOL N/A 12/08/2015   Procedure: COLONOSCOPY WITH PROPOFOL;  Surgeon: Lollie Sails, MD;  Location: [facility] ENDOSCOPY;  Service: Endoscopy;  Laterality: N/A;  . COLONOSCOPY WITH PROPOFOL N/A 12/09/2015   Procedure: COLONOSCOPY WITH PROPOFOL;  Surgeon: Lollie Sails, MD;  Location: [facility] ENDOSCOPY;  Service: Endoscopy;  Laterality: N/A;  . CORONARY ANGIOPLASTY     5 stents  . CORONARY STENT PLACEMENT  2015   x5  . CYSTOSCOPY N/A 09/11/2018   Procedure: CYSTOSCOPY;  Surgeon: [name], MD;  Location: [facility] ORS;  Service: Urology;  Laterality: N/A;  . CYSTOSCOPY WITH BIOPSY N/A 12/21/2017   Procedure: CYSTOSCOPY WITH BIOPSY;  Surgeon: [name], MD;  Location: [facility] ORS;  Service: Urology;  Laterality: N/A;  . ESOPHAGOGASTRODUODENOSCOPY (EGD) WITH PROPOFOL N/A 12/08/2015   Procedure: ESOPHAGOGASTRODUODENOSCOPY (EGD) WITH PROPOFOL;  Surgeon: Lollie Sails, MD;  Location: [facility] ENDOSCOPY;  Service: Endoscopy;  Laterality: N/A;  . ESOPHAGOGASTRODUODENOSCOPY (EGD) WITH PROPOFOL N/A 12/13/2017   Procedure: ESOPHAGOGASTRODUODENOSCOPY (EGD) WITH PROPOFOL;  Surgeon: Lollie Sails, MD;  Location: [facility] ENDOSCOPY;  Service: Endoscopy;  Laterality: N/A;  . EYE SURGERY    . HERNIA REPAIR    . kidney tumor remove    . TRANSURETHRAL  RESECTION OF BLADDER TUMOR WITH GYRUS (TURBT-GYRUS)  12/2013  . UMBILICAL HERNIA REPAIR    . urethral meatotomy       Social History   Tobacco Use  . Smoking status: Former Smoker    Packs/day: 2.00    Years: 40.00    Pack years: 80.00    Types: Cigarettes    Quit date: 07/30/1989    Years since quitting: 30.9  . Smokeless tobacco: Never Used  Vaping Use  . Vaping Use: Never used  Substance  Use Topics  . Alcohol use: Not Currently    Alcohol/week: 0.0 standard drinks    Comment: rarely  . Drug use: No      Family History  Problem Relation Age of Onset  . Kidney cancer Mother   . Prostate cancer Neg Hx   no bleeding or clotting disorders  Allergies  Allergen Reactions  . Ace Inhibitors Cough  . Carbamazepine Rash    Other reaction(s): RASH Other reaction(s): RASH   . Lyrica [Pregabalin] Rash     REVIEW OF SYSTEMS(Negative unless checked)  Constitutional: [] ??Weight loss [] ??Fever [] ??Chills Cardiac: [] ??Chest pain [] ??Chest pressure [] ??Palpitations [] ??Shortness of breath when laying flat [] ??Shortness of breath at rest [] ??Shortness of breath with exertion. Vascular: [] ??Pain in legs with walking [] ??Pain in legs at rest [] ??Pain in legs when laying flat [] ??Claudication [] ??Pain in feet when walking [] ??Pain in feet at rest [] ??Pain in feet when laying flat [] ??History of DVT [] ??Phlebitis [x] ??Swelling in legs [] ??Varicose veins [x] ??Non-healing ulcers Pulmonary: [] ??Uses home oxygen [] ??Productive cough [] ??Hemoptysis [] ??Wheeze [] ??COPD [] ??Asthma Neurologic: [] ??Dizziness [] ??Blackouts [] ??Seizures [] ??History of stroke [] ??History of TIA [] ??Aphasia [] ??Temporary blindness [] ??Dysphagia [] ??Weakness or numbness in arms [] ??Weakness or numbness in legs Musculoskeletal: [x] ??Arthritis [] ??Joint swelling [] ??Joint pain [] ??Low back pain Hematologic: [] ??Easy bruising [] ??Easy bleeding  [] ??Hypercoagulable state [] ??Anemic [] ??Hepatitis Gastrointestinal: [] ??Blood in stool [] ??Vomiting blood [x] ??Gastroesophageal reflux/heartburn [] ??Abdominal pain Genitourinary: [] ??Chronic kidney disease [] ??Difficult urination [] ??Frequent urination [] ??Burning with urination [] ??Hematuria Skin: [] ??Rashes [x] ??Ulcers [x] ??Wounds Psychological: [x] ??History of anxiety [] ??History of major depression.  Physical Examination  BP 130/73 (BP Location: Right Arm)   Pulse 63   Resp 16   Wt 228 lb (103.4 kg)   BMI 35.71 kg/m  Gen:  WD/WN, NAD Head: [place]/AT, No temporalis wasting. Ear/Nose/Throat: Hearing grossly intact, nares w/o erythema or drainage Eyes: Conjunctiva clear. Sclera non-icteric Neck: Supple.  Trachea midline Pulmonary:  Good air movement, no use of accessory muscles.  Cardiac: RRR, no JVD Vascular:  Vessel Right Left  Radial Palpable Palpable                   Musculoskeletal: M/S 5/5 throughout.  No deformity or atrophy. Trace BLE edema. Neurologic: Sensation grossly intact in extremities.  Symmetrical.  Speech is fluent.  Psychiatric: Judgment intact, Mood & affect appropriate for pt's clinical situation. Dermatologic: No rashes or ulcers noted.  No cellulitis or open wounds.       Labs No results found for this or any previous visit (from the past 2160 hour(s)).  Radiology No results found.  Assessment/Plan Carotid artery narrowing Checked last year and 1-49% bilaterally.  Has not been followed here previously, but I have told him we are happy to do so if he would like.  Benign essential HTN blood pressure control important in reducing the progression of atherosclerotic disease. On appropriate oral medications.   Diet-controlled type 2 diabetes mellitus (HCC) blood glucose control important in reducing the progression of atherosclerotic disease. Also, involved in wound healing.   Swelling of limb Much improved.  Has  been wearing stockings.  Now wearing diabetic socks.  Continue elevation in activity.  Recheck as needed.    Leotis Pain, MD  06/27/2020 10:54 AM    This note was created with Dragon medical transcription system.  Any errors from dictation are purely unintentional

## 2020-09-09 ENCOUNTER — Other Ambulatory Visit: Payer: Self-pay

## 2020-09-09 ENCOUNTER — Ambulatory Visit: Payer: Medicare HMO | Admitting: Urology

## 2020-09-09 ENCOUNTER — Encounter: Payer: Self-pay | Admitting: Urology

## 2020-09-09 VITALS — BP 115/68 | HR 68 | Ht 67.0 in | Wt 228.0 lb

## 2020-09-09 DIAGNOSIS — N3281 Overactive bladder: Secondary | ICD-10-CM

## 2020-09-09 DIAGNOSIS — Z8551 Personal history of malignant neoplasm of bladder: Secondary | ICD-10-CM

## 2020-09-09 LAB — URINALYSIS, COMPLETE
Bilirubin, UA: NEGATIVE
Glucose, UA: NEGATIVE
Ketones, UA: NEGATIVE
Leukocytes,UA: NEGATIVE
Nitrite, UA: NEGATIVE
Protein,UA: NEGATIVE
RBC, UA: NEGATIVE
Specific Gravity, UA: 1.02 (ref 1.005–1.030)
Urobilinogen, Ur: 0.2 mg/dL (ref 0.2–1.0)
pH, UA: 5 (ref 5.0–7.5)

## 2020-09-09 LAB — MICROSCOPIC EXAMINATION
Bacteria, UA: NONE SEEN
RBC, Urine: NONE SEEN /hpf (ref 0–2)
WBC, UA: NONE SEEN /hpf (ref 0–5)

## 2020-09-09 MED ORDER — VIBEGRON 75 MG PO TABS: 75.0000 mg | ORAL_TABLET | Freq: Every day | ORAL | 0 refills | Status: DC

## 2020-09-09 NOTE — Progress Notes (Signed)
   09/04/2019  CC:  Chief Complaint  Patient presents with   Cysto     HPI: [name] is a 75 y.o. male with a hx of bladder cancer returns today for a surveillance cysto.   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  Continues to struggle with urinary urgency/urge incontinence.  Has now failed Botox, multiple oral agents as well as stage I InterStim.  Mild improvement with PTNS.  Currently back on oxybutynin, managed by Dr. Matilde Sprang.  Most recent PSA was 0.62 in as of 08/2018.  Blood pressure 115/68, pulse 68, height 5\' 7"  (1.702 m), weight 228 lb (103.4 kg).   NED. A&Ox3.   No respiratory distress   Abd soft, NT, ND Normal phallus with bilateral descended testicles  Cystoscopy Procedure Note  Patient identification was confirmed, informed consent was obtained, and patient was prepped using Betadine solution.  Lidocaine jelly was administered per urethral meatus.     Pre-Procedure: - Inspection reveals a normal caliber ureteral meatus.  Procedure: The flexible cystoscope was introduced without difficulty - No urethral strictures/lesions are present. - Normal prostate  - Normal bladder neck - Bilateral ureteral orifices identified - Bladder mucosa  reveals no ulcers, tumors, or lesions - No bladder stones - Moderate trabeculation with saccules  Retroflexion shows unremarkable   Post-Procedure: - Patient tolerated the procedure well    Assessment/ Plan:  1. OAB (overactive bladder) Severe refractory symptoms is failed essentially all intervention  Currently back on oxybutynin which initially helped some but now not so much; do not believe that he is tried [place] yet and as such, we will give him a month samples to take in conjunction with oxybutynin to see if this helps at all.  He will let us know.  2. History of bladder cancer NED Shared decision making with  patient regarding whether or not to continue surveillance- elects to continue   Follow up in 1 year for cysto.  [name], MD

## 2020-11-26 ENCOUNTER — Other Ambulatory Visit (INDEPENDENT_AMBULATORY_CARE_PROVIDER_SITE_OTHER): Payer: Self-pay | Admitting: Vascular Surgery

## 2020-11-26 DIAGNOSIS — I6529 Occlusion and stenosis of unspecified carotid artery: Secondary | ICD-10-CM

## 2020-11-28 ENCOUNTER — Ambulatory Visit (INDEPENDENT_AMBULATORY_CARE_PROVIDER_SITE_OTHER): Payer: Medicare HMO | Admitting: Vascular Surgery

## 2020-11-28 ENCOUNTER — Encounter (INDEPENDENT_AMBULATORY_CARE_PROVIDER_SITE_OTHER): Payer: Medicare HMO

## 2020-12-01 ENCOUNTER — Encounter (INDEPENDENT_AMBULATORY_CARE_PROVIDER_SITE_OTHER): Payer: Self-pay | Admitting: Vascular Surgery

## 2020-12-29 ENCOUNTER — Other Ambulatory Visit: Payer: Self-pay | Admitting: Urology

## 2021-02-18 DIAGNOSIS — R52 Pain, unspecified: Secondary | ICD-10-CM | POA: Insufficient documentation

## 2021-02-18 DIAGNOSIS — M542 Cervicalgia: Secondary | ICD-10-CM | POA: Insufficient documentation

## 2021-03-14 ENCOUNTER — Other Ambulatory Visit: Payer: Self-pay | Admitting: Urology

## 2021-04-01 DIAGNOSIS — G20A1 Parkinson's disease without dyskinesia, without mention of fluctuations: Secondary | ICD-10-CM | POA: Insufficient documentation

## 2021-04-01 DIAGNOSIS — F419 Anxiety disorder, unspecified: Secondary | ICD-10-CM | POA: Insufficient documentation

## 2021-04-01 DIAGNOSIS — Z8551 Personal history of malignant neoplasm of bladder: Secondary | ICD-10-CM | POA: Insufficient documentation

## 2021-04-07 ENCOUNTER — Other Ambulatory Visit: Payer: Self-pay

## 2021-04-07 ENCOUNTER — Ambulatory Visit: Payer: Medicare HMO | Attending: Neurology

## 2021-04-07 DIAGNOSIS — R269 Unspecified abnormalities of gait and mobility: Secondary | ICD-10-CM | POA: Insufficient documentation

## 2021-04-07 DIAGNOSIS — M6281 Muscle weakness (generalized): Secondary | ICD-10-CM | POA: Diagnosis present

## 2021-04-07 DIAGNOSIS — R262 Difficulty in walking, not elsewhere classified: Secondary | ICD-10-CM | POA: Diagnosis not present

## 2021-04-07 DIAGNOSIS — R2681 Unsteadiness on feet: Secondary | ICD-10-CM | POA: Diagnosis present

## 2021-04-08 NOTE — Therapy (Signed)
[place] MAIN [facility] SERVICES [address] [place], [place], [address] Phone: [phone]   Fax:  [phone]  Physical Therapy Evaluation  Patient Details  Name: [name] MRN: [id number] Date of Birth: [birth year] Referring Provider (PT): Dr. Gurney Maxin   Encounter Date: 04/07/2021   PT End of Session - 04/08/21 0921     Visit Number 1    Number of Visits 25    Date for PT Re-Evaluation 06/30/21    Authorization Time Period Initial Certification= 07/05/4330- 06/30/2021    PT Start Time 1100    PT Stop Time 1205    PT Time Calculation (min) 65 min    Equipment Utilized During Treatment Gait belt    Activity Tolerance Patient tolerated treatment well    Behavior During Therapy [facility] for tasks assessed/performed             Past Medical History:  Diagnosis Date   Anxiety    Arteriosclerosis of coronary artery 11/16/2011   Overview:  Stent 10/2011 stent rca 2015 with collaterals to lad which is chronically occluded    Benign enlargement of prostate    Benign essential HTN 06/11/2014   Benign prostatic hypertrophy without urinary obstruction 07/31/2014   Bilateral cataracts 05/16/2013   Overview:  Dr. 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   Hearing loss in left ear    Heart disease    Hematuria    Hemorrhoids    History of hiatal hernia 12/14/2017   small    Hypercholesteremia    Lesion of bladder    Myocardial infarct (HCC)     Presence of stent in coronary artery 11/09/2012   Rectal bleeding    Trigeminal neuralgia    Trigeminal neuralgia    Valvular heart disease    Vitamin D deficiency     Past Surgical History:  Procedure Laterality Date   APPENDECTOMY     BOTOX INJECTION N/A 12/21/2017   Procedure: Bladder BOTOX INJECTION;  Surgeon: [name], MD;  Location: [facility] ORS;  Service: Urology;  Laterality: N/A;   BOTOX INJECTION N/A 09/11/2018   Procedure: Bladder BOTOX INJECTION;  Surgeon: [name], MD;  Location: [facility] ORS;  Service: Urology;  Laterality: N/A;   CARDIAC CATHETERIZATION     CARDIAC CATHETERIZATION N/A 01/22/2015   Procedure: Left Heart Cath;  Surgeon: Corey Skains, MD;  Location: [facility];  Service: Cardiovascular;  Laterality: N/A;   CARDIAC CATHETERIZATION N/A 01/22/2015   Procedure: Coronary Stent Intervention;  Surgeon: Isaias Cowman, MD;  Location: [facility];  Service: Cardiovascular;  Laterality: N/A;   CATARACT EXTRACTION, BILATERAL     COLONOSCOPY WITH PROPOFOL N/A 12/08/2015   Procedure: COLONOSCOPY WITH PROPOFOL;  Surgeon: Lollie Sails, MD;  Location: [facility] ENDOSCOPY;  Service: Endoscopy;  Laterality: N/A;   COLONOSCOPY WITH PROPOFOL N/A  12/09/2015   Procedure: COLONOSCOPY WITH PROPOFOL;  Surgeon: Lollie Sails, MD;  Location: [facility] ENDOSCOPY;  Service: Endoscopy;  Laterality: N/A;   CORONARY ANGIOPLASTY     5 stents   CORONARY STENT PLACEMENT  2015   x5   CYSTOSCOPY N/A 09/11/2018   Procedure: CYSTOSCOPY;  Surgeon: [name], MD;  Location: [facility] ORS;  Service: Urology;  Laterality: N/A;   CYSTOSCOPY WITH BIOPSY N/A 12/21/2017   Procedure: CYSTOSCOPY WITH BIOPSY;  Surgeon: [name], MD;  Location: [facility] ORS;  Service: Urology;  Laterality: N/A;   ESOPHAGOGASTRODUODENOSCOPY (EGD) WITH PROPOFOL N/A 12/08/2015   Procedure: ESOPHAGOGASTRODUODENOSCOPY (EGD) WITH PROPOFOL;  Surgeon: Lollie Sails, MD;  Location: [facility]  ENDOSCOPY;  Service: Endoscopy;  Laterality: N/A;   ESOPHAGOGASTRODUODENOSCOPY (EGD) WITH PROPOFOL N/A 12/13/2017   Procedure: ESOPHAGOGASTRODUODENOSCOPY (EGD) WITH PROPOFOL;  Surgeon: Lollie Sails, MD;  Location: [facility] ENDOSCOPY;  Service: Endoscopy;  Laterality: N/A;   EYE SURGERY     HERNIA REPAIR     kidney tumor remove     TRANSURETHRAL RESECTION OF BLADDER TUMOR WITH GYRUS (TURBT-GYRUS)  20/2542   UMBILICAL HERNIA REPAIR     urethral meatotomy      There were no vitals filed for this visit.    Subjective Assessment - 04/07/21 1130     Subjective Patient reports he has been having issues with more shuffling gait and decreased balance and agreeable to trial of PT to see if he can improve.    Patient is accompained by: Family member    Pertinent History Patient is a 76 year old male with referral for Physical Therapy with diagnosis of Parkinsons disease and history of falling. He reports he diagnosed last year with Parkinsons and has improved his tremors with use of medication but reports worsening shuffling and difficulty with mobility. Patient has past medical history signifiant for Parkinsons; Arthritis, bladder cancer, Trigemic Neuralgia. He lives with wife in 1 level home and current using cane with reported recent fall.    Limitations Walking;Lifting;Standing;House hold activities    How long can you sit comfortably? No limits    How long can you stand comfortably? 15 min- limited due to back pain and foot numbess    How long can you walk comfortably? about 80 feet    Patient Stated Goals Improve my balance, be able to get up off the floor so I can maybe work in my garage again.    Currently in Pain? No/denies                [facility] PT Assessment - 04/07/21 1133       Assessment   Medical Diagnosis Parkinsons disease    Referring Provider (PT) Dr. Gurney Maxin    Onset Date/Surgical Date 01/28/21    Hand Dominance Right    Next MD Visit 04/15/2021    Prior  Therapy no      Precautions   Precautions Fall      Restrictions   Weight Bearing Restrictions No      Balance Screen   Has the patient fallen in the past 6 months Yes    How many times? 1    Has the patient had a decrease in activity level because of a fear of falling?  No    Is the patient reluctant to leave their home because of a fear of falling?  No      Home Ecologist residence    Living Arrangements Spouse/significant other  Available Help at Discharge Family;Friend(s)    Type of [place] to enter    Entrance Stairs-Number of Steps 1    Entrance Stairs-Rails Right    Home Layout One level    [place] - 2 wheels;Walker - 4 wheels;Kasandra Knudsen - single point      Prior Function   Level of Independence Independent    Vocation Retired      Charity fundraiser Status Within Functional Limits for tasks assessed    Attention Focused    Focused Attention Appears intact    Memory Impaired    Memory Impairment Decreased long term memory;Decreased short term memory              OBJECTIVE  Musculoskeletal Tremor: Absent Bulk: Normal Tone: Normal, no clonus   Posture Forward head/rounded shoulders  Gait -shuffling gait with forward trunk lean- decreased UE swing  Strength R/L 4/4 Hip flexion 4/4 Hip external rotation  4/4 Hip internal rotation  4/4 Hip extension   4/4 Hip abduction 4/4 Hip adduction  4/4 Knee extension 4/4 Knee flexion  3+/3+ Ankle Plantarflexion  3+/3+Ankle Dorsiflexion     Sensation Grossly intact to light touch bilateral UEs/LEs as determined by testing dermatomes C2-T2/L2-S2 respectively. Proprioception and hot/cold testing deferred on this date     FUNCTIONAL OUTCOME MEASURES   Results Comments              TUG 21 seconds Using SPC- Increased risk of falling  5TSTS  25 seconds       10 Meter Gait Speed Self-selected: 18 s =0.56 m/s Below  normative values for full community ambulation  FOTO  41%         ASSESSMENT Clinical Impression: Pt is a pleasant 76 year-old male referred with diagnosis of Parkinsons with history of falling. PT examination reveals deficits including Bilateral Lower extremity weakness, gait and balance impairments including shuffling gait with decreased gait speed indicative of increased risk of falling. Patient will need further balance testing next visit and assessment of trunk flexibility. Patient will benefit from skilled PT services to address these deficits, improve balance, and decrease risk for future falls.    PLAN Next Visit: Assess balance- using BERG or FGA; assess trunk/LE flexibility, Progress LE strength and implement HEP next 1-2 visits HEP: To be initiated next 1-2 visits.                    Objective measurements completed on examination: See above findings.                PT Education - 04/08/21 0920     Education Details Physical Therapy plan of care    Person(s) Educated Patient    Methods Explanation    Comprehension Verbalized understanding              PT Short Term Goals - 04/08/21 0911       PT SHORT TERM GOAL #1   Title Pt will be independent with INITIAL  HEP in order to improve strength and balance in order to decrease fall risk and improve function at home and work.    Baseline 04/07/2021= No formal HEP in place    Time 6    Period Weeks    Status New    Target Date 05/19/21      PT SHORT TERM GOAL #2   Title Pt will decrease 5TSTS by at least 3 seconds  in order to demonstrate clinically significant improvement in LE strength.    Baseline 04/07/2021= 25 sec    Time 6    Period Weeks    Status New    Target Date 06/30/21               PT Long Term Goals - 04/08/21 0905       PT LONG TERM GOAL #1   Title Pt will be independent with FINAL HEP in order to improve strength and balance in order to decrease fall risk and  improve function at home and work.    Baseline 04/07/2021= No formal HEP in place.    Time 12    Period Weeks    Status New    Target Date 06/30/21      PT LONG TERM GOAL #2   Title Pt will improve FOTO to target score of 45% to display perceived improvements in ability to complete ADL's.    Baseline 2/7/22023= 41%    Time 12    Period Weeks    Status New    Target Date 06/30/21      PT LONG TERM GOAL #3   Title Pt will decrease 5TSTS by at least 5 seconds in order to demonstrate clinically significant improvement in LE strength.    Baseline 04/07/2021= 25 sec    Time 12    Period Weeks    Status New    Target Date 06/30/21      PT LONG TERM GOAL #4   Title Pt will decrease TUG to below 17 seconds/decrease in order to demonstrate decreased fall risk.    Baseline 04/07/2021= 21 sec with SPC    Time 12    Period Weeks    Status New    Target Date 06/30/21      PT LONG TERM GOAL #5   Title Pt will increase 10MWT by at least 0.15 m/s in order to demonstrate clinically significant improvement in community ambulation.    Baseline 04/07/2021= 0.56 m/s using SPC    Time 12    Period Weeks    Status New    Target Date 06/30/21                    Plan - 04/08/21 4235     Clinical Impression Statement Pt is a pleasant 76 year-old male referred with diagnosis of Parkinsons with history of falling. PT examination reveals deficits including Bilateral Lower extremity weakness, gait and balance impairments including shuffling gait with decreased gait speed indicative of increased risk of falling. Patient will need further balance testing next visit and assessment of trunk flexibility. Patient will benefit from skilled PT services to address these deficits, improve balance, and decrease risk for future falls    Personal Factors and Comorbidities Comorbidity 3+    Comorbidities arthritis, bladder cancer, Trigeminal Neuralgia    Examination-Activity Limitations Caring for  Others;Carry;Continence;Lift;Squat;Stairs;Stand    Examination-Participation Restrictions Community Activity;Yard Work    Merchant navy officer Evolving/Moderate complexity    Clinical Decision Making Moderate    Rehab Potential Good    PT Frequency 2x / week    PT Duration 12 weeks    PT Treatment/Interventions ADLs/Self Care Home Management;Cryotherapy;Canalith Repostioning;Electrical Stimulation;Moist Heat;DME Instruction;Gait training;Stair training;Functional mobility training;Therapeutic activities;Therapeutic exercise;Balance training;Neuromuscular re-education;Patient/family education;Manual techniques;Passive range of motion;Dry needling;Vestibular    PT Next Visit Plan Assess balance- using BERG or FGA; assess trunk/LE flexibility, Progress LE strength and implement HEP next 1-2 visits  PT Home Exercise Plan To be initiated next 1-2 visits.             Patient will benefit from skilled therapeutic intervention in order to improve the following deficits and impairments:  Abnormal gait, Decreased activity tolerance, Decreased balance, Decreased coordination, Decreased endurance, Decreased mobility, Decreased range of motion, Decreased strength, Difficulty walking, Hypomobility, Impaired flexibility  Visit Diagnosis: Difficulty in walking, not elsewhere classified  Abnormality of gait and mobility  Muscle weakness (generalized)  Unsteadiness on feet     Problem List Patient Active Problem List   Diagnosis Date Noted   Class 2 obesity due to excess calories with body mass index (BMI) of 36.0 to 36.9 in adult 04/11/2020   Swelling of limb 03/28/2020   Lymphedema 03/28/2020   Trigeminal neuralgia 12/25/2019   Lower limb ulcer, calf, left, limited to breakdown of skin ([place]) 12/25/2019   OSA (obstructive sleep apnea) 82/42/3536   Acute systolic CHF (congestive heart failure) ([place]) 12/12/2018   Bruising 07/10/2018   Diet-controlled type 2 diabetes mellitus  ([place]) 03/24/2018   Microalbuminuria 03/24/2018   Dizziness 11/17/2017   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   SOBOE (shortness of breath on exertion) 02/18/2017   Chronic midline low back pain without sciatica 12/29/2016   Periodic limb movement disorder 12/29/2016   Benign essential tremor 06/22/2016   Pure hypercholesterolemia 06/20/2015   Erectile dysfunction due to arterial insufficiency 06/16/2015   Unstable angina ([place]) 01/22/2015   Abdominal aortic 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 rupture 12/31/2014   Benign prostatic hypertrophy without urinary obstruction 07/31/2014   Enlarged prostate 07/31/2014   Benign essential HTN 06/11/2014   Carotid artery narrowing 02/08/2014   Carotid artery obstruction 02/08/2014   Bilateral carotid artery stenosis 02/08/2014   Chest pain 08/20/2013   Bilateral cataracts 05/16/2013   Cataract 05/16/2013   Clinical depression 12/12/2012   Diabetes mellitus, type 2 ([place]) 12/12/2012   Combined fat and carbohydrate induced hyperlipemia 12/12/2012   Major depressive disorder, single episode, unspecified 12/12/2012   Acid reflux 11/09/2012   Presence of stent in coronary artery 11/09/2012   BP (high blood pressure) 11/09/2012   Healed myocardial infarct 11/09/2012   Gastro-esophageal reflux disease without esophagitis 11/09/2012   History of cardiovascular surgery 11/09/2012   Fothergill's neuralgia 08/07/2012   Swelling of testicle 04/06/2012   Disorder of male genital organ 04/06/2012   Swelling of the testicles 04/06/2012   Arteriosclerosis of coronary artery 11/16/2011   CAD in native artery 11/16/2011   Fatigue 06/04/2011   Avitaminosis D 11/30/2010    Lewis Moccasin, PT 04/08/2021, 9:26 AM  [place] MAIN [facility] SERVICES [address] [place], [place], [address] Phone: [phone]   Fax:  [phone]  Name: [name] MRN: [id number] Date of Birth: 07-06-1945

## 2021-04-10 ENCOUNTER — Other Ambulatory Visit: Payer: Self-pay

## 2021-04-10 ENCOUNTER — Ambulatory Visit: Payer: Medicare HMO

## 2021-04-10 DIAGNOSIS — R2681 Unsteadiness on feet: Secondary | ICD-10-CM

## 2021-04-10 DIAGNOSIS — R269 Unspecified abnormalities of gait and mobility: Secondary | ICD-10-CM

## 2021-04-10 DIAGNOSIS — M6281 Muscle weakness (generalized): Secondary | ICD-10-CM

## 2021-04-10 DIAGNOSIS — R262 Difficulty in walking, not elsewhere classified: Secondary | ICD-10-CM | POA: Diagnosis not present

## 2021-04-10 NOTE — Patient Instructions (Signed)
Access Code: [email or web address] URL: [email or web address] Date: 04/10/2021 Prepared by: Janna Arch  Exercises Seated Hamstring Stretch - 1 x daily - 7 x weekly - 2 sets - 2 reps - 30 hold Seated Piriformis Stretch - 1 x daily - 7 x weekly - 2 sets - 2 reps - 30 hold Standing March with Counter Support - 1 x daily - 7 x weekly - 2 sets - 10 reps - 5 hold Standing Tandem Balance with Counter Support - 1 x daily - 7 x weekly - 2 sets - 2 reps - 30 hold

## 2021-04-10 NOTE — Therapy (Signed)
[place] MAIN [facility] SERVICES [address] [place], [place], [address] Phone: [phone]   Fax:  [phone]  Physical Therapy Treatment  Patient Details  Name: [name] MRN: [id number] Date of Birth: 1945-07-27 Referring Provider (PT): Dr. Gurney Maxin   Encounter Date: 04/10/2021   PT End of Session - 04/10/21 1029     Visit Number 2    Number of Visits 25    Date for PT Re-Evaluation 06/30/21    Authorization Time Period Initial Certification= 09/02/9447- 06/30/2021    PT Start Time 0935    PT Stop Time 1019    PT Time Calculation (min) 44 min    Equipment Utilized During Treatment Gait belt    Activity Tolerance Patient tolerated treatment well    Behavior During Therapy [facility] for tasks assessed/performed             Past Medical History:  Diagnosis Date   Anxiety    Arteriosclerosis of coronary artery 11/16/2011   Overview:  Stent 10/2011 stent rca 2015 with collaterals to lad which is chronically occluded    Benign enlargement of prostate    Benign essential HTN 06/11/2014   Benign prostatic hypertrophy without urinary obstruction 07/31/2014   Bilateral cataracts 05/16/2013   Overview:  Dr. [facility]     Bone spur of foot    Left   BP (high blood pressure) 11/09/2012   Cancer ([place])    skin (forehead) and bladder   Carotid artery narrowing 02/08/2014   Depression    Detrusor hypertrophy    Diabetes ([place])    Diabetes mellitus, type 2 ([place]) 12/12/2012   Diverticulosis    Dyspnea    Esophageal reflux    Esophageal reflux    Fothergill's neuralgia 08/07/2012   Overview:  [facility]    Gastritis    GERD (gastroesophageal reflux disease)    Headache    cluster headaches   Healed myocardial infarct 11/09/2012   Hearing loss in left ear    Heart disease    Hematuria    Hemorrhoids    History of hiatal hernia 12/14/2017   small    Hypercholesteremia    Lesion of bladder    Myocardial infarct (HCC)     Presence of stent in coronary artery 11/09/2012   Rectal bleeding    Trigeminal neuralgia    Trigeminal neuralgia    Valvular heart disease    Vitamin D deficiency     Past Surgical History:  Procedure Laterality Date   APPENDECTOMY     BOTOX INJECTION N/A 12/21/2017   Procedure: Bladder BOTOX INJECTION;  Surgeon: [name], MD;  Location: [facility] ORS;  Service: Urology;  Laterality: N/A;   BOTOX INJECTION N/A 09/11/2018   Procedure: Bladder BOTOX INJECTION;  Surgeon: [name], MD;  Location: [facility] ORS;  Service: Urology;  Laterality: N/A;   CARDIAC CATHETERIZATION     CARDIAC CATHETERIZATION N/A 01/22/2015   Procedure: Left Heart Cath;  Surgeon: Corey Skains, MD;  Location: [place] CV LAB;  Service: Cardiovascular;  Laterality: N/A;   CARDIAC CATHETERIZATION N/A 01/22/2015   Procedure: Coronary Stent Intervention;  Surgeon: Isaias Cowman, MD;  Location: [place] CV LAB;  Service: Cardiovascular;  Laterality: N/A;   CATARACT EXTRACTION, BILATERAL     COLONOSCOPY WITH PROPOFOL N/A 12/08/2015   Procedure: COLONOSCOPY WITH PROPOFOL;  Surgeon: Lollie Sails, MD;  Location: [facility] ENDOSCOPY;  Service: Endoscopy;  Laterality: N/A;   COLONOSCOPY WITH PROPOFOL N/A  12/09/2015   Procedure: COLONOSCOPY WITH PROPOFOL;  Surgeon: Lollie Sails, MD;  Location: [facility] ENDOSCOPY;  Service: Endoscopy;  Laterality: N/A;   CORONARY ANGIOPLASTY     5 stents   CORONARY STENT PLACEMENT  2015   x5   CYSTOSCOPY N/A 09/11/2018   Procedure: CYSTOSCOPY;  Surgeon: [name], MD;  Location: [facility] ORS;  Service: Urology;  Laterality: N/A;   CYSTOSCOPY WITH BIOPSY N/A 12/21/2017   Procedure: CYSTOSCOPY WITH BIOPSY;  Surgeon: [name], MD;  Location: [facility] ORS;  Service: Urology;  Laterality: N/A;   ESOPHAGOGASTRODUODENOSCOPY (EGD) WITH PROPOFOL N/A 12/08/2015   Procedure: ESOPHAGOGASTRODUODENOSCOPY (EGD) WITH PROPOFOL;  Surgeon: Lollie Sails, MD;  Location: [facility]  ENDOSCOPY;  Service: Endoscopy;  Laterality: N/A;   ESOPHAGOGASTRODUODENOSCOPY (EGD) WITH PROPOFOL N/A 12/13/2017   Procedure: ESOPHAGOGASTRODUODENOSCOPY (EGD) WITH PROPOFOL;  Surgeon: Lollie Sails, MD;  Location: [facility] ENDOSCOPY;  Service: Endoscopy;  Laterality: N/A;   EYE SURGERY     HERNIA REPAIR     kidney tumor remove     TRANSURETHRAL RESECTION OF BLADDER TUMOR WITH GYRUS (TURBT-GYRUS)  17/6160   UMBILICAL HERNIA REPAIR     urethral meatotomy      There were no vitals filed for this visit.   Subjective Assessment - 04/10/21 1028     Subjective Patient presents with wife to PT session. Is eager to be able to walk with less shuffling. No falls since last session    Patient is accompained by: Family member    Pertinent History Patient is a 76 year old male with referral for Physical Therapy with diagnosis of Parkinsons disease and history of falling. He reports he diagnosed last year with Parkinsons and has improved his tremors with use of medication but reports worsening shuffling and difficulty with mobility. Patient has past medical history signifiant for Parkinsons; Arthritis, bladder cancer, Trigemic Neuralgia. He lives with wife in 1 level home and current using cane with reported recent fall.    Limitations Walking;Lifting;Standing;House hold activities    How long can you sit comfortably? No limits    How long can you stand comfortably? 15 min- limited due to back pain and foot numbess    How long can you walk comfortably? about 80 feet    Patient Stated Goals Improve my balance, be able to get up off the floor so I can maybe work in my garage again.    Currently in Pain? No/denies                [facility] PT Assessment - 04/10/21 0001       Flexibility   Soft Tissue Assessment /Muscle Length yes    Hamstrings limited bilaterally with R>L    ITB limited bilaterally with R>L    Piriformis limited bilaterally with R>L    Quadratus Lumborum limited bilaterally with  R>L      Standardized Balance Assessment   Standardized Balance Assessment Berg Balance Test      Berg Balance Test   Sit to Stand Able to stand  independently using hands    Standing Unsupported Able to stand 2 minutes with supervision    Sitting with Back Unsupported but Feet Supported on Floor or Stool Able to sit safely and securely 2 minutes    Stand to Sit Sits safely with minimal use of hands    Transfers Able to transfer safely, minor use of hands    Standing Unsupported with Eyes Closed Able to stand 10 seconds with supervision  Standing Unsupported with Feet Together Able to place feet together independently and stand for 1 minute with supervision    From Standing, Reach Forward with Outstretched Arm Can reach forward >12 cm safely (5")    From Standing Position, Pick up Object from [place] to pick up shoe, needs supervision    From Standing Position, Turn to Look Behind Over each Shoulder Looks behind from both sides and weight shifts well    Turn 360 Degrees Able to turn 360 degrees safely but slowly    Standing Unsupported, Alternately Place Feet on Step/Stool Able to complete >2 steps/needs minimal assist    Standing Unsupported, One Foot in [place] to take small step independently and hold 30 seconds    Standing on One Leg Unable to try or needs assist to prevent fall    Total Score 39            Assessment:   BERG: 39/56   Treatment:   supine:  assess LE muscle tissue length: Hamstrings: -limited bilaterally with R >LLE -hold hamstring lengthening stretch with leg on PT arm x60 seconds each LE Figure 4: -unable to obtain full position; extremely limited bilaterally with R>LLE -hold modification of stretch 30 seconds each LE Single knee to chest -limited bilaterally -hold stretch 60 seconds each LE Cross body knee to chest stretch for exterior thigh musculature: -extremely limited bilaterally R>LLE -hold stretch modification 60 seconds each LE    Standing with CGA next to support surface:  Airex pad: static stand 30 seconds x 2 trials, noticeable trembling of ankles/LE's with fatigue and challenge to maintain stability Airex pad: horizontal head turns 30 seconds scanning room 10x ; cueing for arc of motion ; limited cervical ROM  Airex pad: vertical head turns 30 seconds, cueing for arc of motion, noticeable sway with upward gaze increasing demand on ankle righting reaction musculature; limited cervical ROM Airex pad: one foot on 6" step one foot on airex pad, hold position for 30 seconds, switch legs, 2x each LE;BUE support  Step over and back theraband on ground with SUE support, cue for large step and stomp upon returning to stance position 10x each LE    Performance of HEP for demonstration of understanding and modification:   Access Code: [email or web address] URL: [email or web address] Date: 04/10/2021 Prepared by: Janna Arch  Exercises Seated Hamstring Stretch - 1 x daily - 7 x weekly - 2 sets - 2 reps - 30 hold Seated Piriformis Stretch - 1 x daily - 7 x weekly - 2 sets - 2 reps - 30 hold Standing March with Counter Support - 1 x daily - 7 x weekly - 2 sets - 10 reps - 5 hold Standing Tandem Balance with Counter Support - 1 x daily - 7 x weekly - 2 sets - 2 reps - 30 hold   Pt educated throughout session about proper posture and technique with exercises. Improved exercise technique, movement at target joints, use of target muscles after min to mod verbal, visual, tactile cues.  Patient presents with excellent motivation throughout physical therapy session. Patient's balance and muscle tissue length examined with BERG demonstrating impaired balance scoring of 39/56 and significant muscle tissue tension limitations found bilaterally with R>LLE. Patient educated on HEP and demonstrated understanding.  Progressive stabilization interventions tolerated well. Future sessions focusing on increasing step length will benefit  patient. Education on cue of "big step" demonstrates short term improvement of ambulation. Patient will benefit from skilled PT services to address these deficits,  improve balance, and decrease risk for future falls           PT Education - 04/10/21 1029     Education Details exercise technique, HEP, BERG    Person(s) Educated Patient;Spouse    Methods Explanation;Demonstration;Tactile cues;Verbal cues;Handout    Comprehension Verbalized understanding;Returned demonstration;Verbal cues required;Tactile cues required;Need further instruction              PT Short Term Goals - 04/08/21 0911       PT SHORT TERM GOAL #1   Title Pt will be independent with INITIAL  HEP in order to improve strength and balance in order to decrease fall risk and improve function at home and work.    Baseline 04/07/2021= No formal HEP in place    Time 6    Period Weeks    Status New    Target Date 05/19/21      PT SHORT TERM GOAL #2   Title Pt will decrease 5TSTS by at least 3 seconds in order to demonstrate clinically significant improvement in LE strength.    Baseline 04/07/2021= 25 sec    Time 6    Period Weeks    Status New    Target Date 06/30/21               PT Long Term Goals - 04/08/21 0905       PT LONG TERM GOAL #1   Title Pt will be independent with FINAL HEP in order to improve strength and balance in order to decrease fall risk and improve function at home and work.    Baseline 04/07/2021= No formal HEP in place.    Time 12    Period Weeks    Status New    Target Date 06/30/21      PT LONG TERM GOAL #2   Title Pt will improve FOTO to target score of 45% to display perceived improvements in ability to complete ADL's.    Baseline 2/7/22023= 41%    Time 12    Period Weeks    Status New    Target Date 06/30/21      PT LONG TERM GOAL #3   Title Pt will decrease 5TSTS by at least 5 seconds in order to demonstrate clinically significant improvement in LE strength.     Baseline 04/07/2021= 25 sec    Time 12    Period Weeks    Status New    Target Date 06/30/21      PT LONG TERM GOAL #4   Title Pt will decrease TUG to below 17 seconds/decrease in order to demonstrate decreased fall risk.    Baseline 04/07/2021= 21 sec with SPC    Time 12    Period Weeks    Status New    Target Date 06/30/21      PT LONG TERM GOAL #5   Title Pt will increase 10MWT by at least 0.15 m/s in order to demonstrate clinically significant improvement in community ambulation.    Baseline 04/07/2021= 0.56 m/s using SPC    Time 12    Period Weeks    Status New    Target Date 06/30/21                   Plan - 04/10/21 1030     Clinical Impression Statement Patient presents with excellent motivation throughout physical therapy session. Patient's balance and muscle tissue length examined with BERG demonstrating impaired balance scoring of 39/56 and significant muscle  tissue tension limitations found bilaterally with R>LLE. Patient educated on HEP and demonstrated understanding.  Progressive stabilization interventions tolerated well. Future sessions focusing on increasing step length will benefit patient. Education on cue of "big step" demonstrates short term improvement of ambulation. Patient will benefit from skilled PT services to address these deficits, improve balance, and decrease risk for future falls    Personal Factors and Comorbidities Comorbidity 3+    Comorbidities arthritis, bladder cancer, Trigeminal Neuralgia    Examination-Activity Limitations Caring for Others;Carry;Continence;Lift;Squat;Stairs;Stand    Examination-Participation Restrictions Community Activity;Yard Work    [employer] Evolving/Moderate complexity    Rehab Potential Good    PT Frequency 2x / week    PT Duration 12 weeks    PT Treatment/Interventions ADLs/Self Care Home Management;Cryotherapy;Canalith Repostioning;Electrical Stimulation;Moist Heat;DME Instruction;Gait  training;Stair training;Functional mobility training;Therapeutic activities;Therapeutic exercise;Balance training;Neuromuscular re-education;Patient/family education;Manual techniques;Passive range of motion;Dry needling;Vestibular    PT Next Visit Plan LE strength, improve step length with ambulation, muscle tissue lengthening    PT Home Exercise Plan Access Code: [email or web address]             Patient will benefit from skilled therapeutic intervention in order to improve the following deficits and impairments:  Abnormal gait, Decreased activity tolerance, Decreased balance, Decreased coordination, Decreased endurance, Decreased mobility, Decreased range of motion, Decreased strength, Difficulty walking, Hypomobility, Impaired flexibility  Visit Diagnosis: Difficulty in walking, not elsewhere classified  Abnormality of gait and mobility  Muscle weakness (generalized)  Unsteadiness on feet     Problem List Patient Active Problem List   Diagnosis Date Noted   Class 2 obesity due to excess calories with body mass index (BMI) of 36.0 to 36.9 in adult 04/11/2020   Swelling of limb 03/28/2020   Lymphedema 03/28/2020   Trigeminal neuralgia 12/25/2019   Lower limb ulcer, calf, left, limited to breakdown of skin ([place]) 12/25/2019   OSA (obstructive sleep apnea) 65/04/5463   Acute systolic CHF (congestive heart failure) ([place]) 12/12/2018   Bruising 07/10/2018   Diet-controlled type 2 diabetes mellitus ([place]) 03/24/2018   Microalbuminuria 03/24/2018   Dizziness 11/17/2017   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   SOBOE (shortness of breath on exertion) 02/18/2017   Chronic midline low back pain without sciatica 12/29/2016   Periodic limb movement disorder 12/29/2016   Benign essential tremor 06/22/2016   Pure hypercholesterolemia 06/20/2015   Erectile dysfunction due to arterial insufficiency 06/16/2015   Unstable angina ([place]) 01/22/2015   Abdominal aortic 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 rupture  12/31/2014   Benign prostatic hypertrophy without urinary obstruction 07/31/2014   Enlarged prostate 07/31/2014   Benign essential HTN 06/11/2014   Carotid artery narrowing 02/08/2014   Carotid artery obstruction 02/08/2014   Bilateral carotid artery stenosis 02/08/2014   Chest pain 08/20/2013   Bilateral cataracts 05/16/2013   Cataract 05/16/2013   Clinical depression 12/12/2012   Diabetes mellitus, type 2 ([place]) 12/12/2012   Combined fat and carbohydrate induced hyperlipemia 12/12/2012   Major depressive disorder, single episode, unspecified 12/12/2012   Acid reflux 11/09/2012   Presence of stent in coronary artery 11/09/2012   BP (high blood pressure) 11/09/2012   Healed myocardial infarct 11/09/2012   Gastro-esophageal reflux disease without esophagitis 11/09/2012   History of cardiovascular surgery 11/09/2012   Fothergill's neuralgia 08/07/2012   Swelling of testicle 04/06/2012   Disorder of male genital organ 04/06/2012   Swelling of the testicles 04/06/2012   Arteriosclerosis of coronary artery 11/16/2011   CAD in native artery 11/16/2011   Fatigue 06/04/2011   Avitaminosis  D 11/30/2010   Janna Arch, PT, DPT  04/10/2021, 10:33 AM  [place] MAIN [facility] SERVICES [address] [place], [place], [address] Phone: [phone]   Fax:  [phone]  Name: [name] MRN: [id number] Date of Birth: 21-Sep-1945

## 2021-04-13 ENCOUNTER — Other Ambulatory Visit: Payer: Self-pay

## 2021-04-13 ENCOUNTER — Ambulatory Visit: Payer: Medicare HMO | Admitting: Physical Therapy

## 2021-04-13 DIAGNOSIS — R262 Difficulty in walking, not elsewhere classified: Secondary | ICD-10-CM | POA: Diagnosis not present

## 2021-04-13 DIAGNOSIS — R269 Unspecified abnormalities of gait and mobility: Secondary | ICD-10-CM

## 2021-04-13 DIAGNOSIS — M6281 Muscle weakness (generalized): Secondary | ICD-10-CM

## 2021-04-13 DIAGNOSIS — R2681 Unsteadiness on feet: Secondary | ICD-10-CM

## 2021-04-13 NOTE — Therapy (Signed)
[place] MAIN [facility] SERVICES [address] [place], [place], [address] Phone: [phone]   Fax:  [phone]  Physical Therapy Treatment  Patient Details  Name: [name] MRN: [id number] Date of Birth: May 28, 1945 Referring Provider (PT): Dr. Gurney Maxin   Encounter Date: 04/13/2021   PT End of Session - 04/13/21 1301     Visit Number 3    Number of Visits 25    Date for PT Re-Evaluation 06/30/21    Authorization Time Period Initial Certification= 7/0/9628- 06/30/2021    PT Start Time 0845    PT Stop Time 0930    PT Time Calculation (min) 45 min    Equipment Utilized During Treatment Gait belt    Activity Tolerance Patient tolerated treatment well    Behavior During Therapy [facility] for tasks assessed/performed             Past Medical History:  Diagnosis Date   Anxiety    Arteriosclerosis of coronary artery 11/16/2011   Overview:  Stent 10/2011 stent rca 2015 with collaterals to lad which is chronically occluded    Benign enlargement of prostate    Benign essential HTN 06/11/2014   Benign prostatic hypertrophy without urinary obstruction 07/31/2014   Bilateral cataracts 05/16/2013   Overview:  Dr. [facility]     Bone spur of foot    Left   BP (high blood pressure) 11/09/2012   Cancer ([place])    skin (forehead) and bladder   Carotid artery narrowing 02/08/2014   Depression    Detrusor hypertrophy    Diabetes ([place])    Diabetes mellitus, type 2 ([place]) 12/12/2012   Diverticulosis    Dyspnea    Esophageal reflux    Esophageal reflux    Fothergill's neuralgia 08/07/2012   Overview:  [facility]    Gastritis    GERD (gastroesophageal reflux disease)    Headache    cluster headaches   Healed myocardial infarct 11/09/2012   Hearing loss in left ear    Heart disease    Hematuria    Hemorrhoids    History of hiatal hernia 12/14/2017   small    Hypercholesteremia    Lesion of bladder    Myocardial infarct (HCC)     Presence of stent in coronary artery 11/09/2012   Rectal bleeding    Trigeminal neuralgia    Trigeminal neuralgia    Valvular heart disease    Vitamin D deficiency     Past Surgical History:  Procedure Laterality Date   APPENDECTOMY     BOTOX INJECTION N/A 12/21/2017   Procedure: Bladder BOTOX INJECTION;  Surgeon: [name], MD;  Location: [facility] ORS;  Service: Urology;  Laterality: N/A;   BOTOX INJECTION N/A 09/11/2018   Procedure: Bladder BOTOX INJECTION;  Surgeon: [name], MD;  Location: [facility] ORS;  Service: Urology;  Laterality: N/A;   CARDIAC CATHETERIZATION     CARDIAC CATHETERIZATION N/A 01/22/2015   Procedure: Left Heart Cath;  Surgeon: Corey Skains, MD;  Location: [place] CV LAB;  Service: Cardiovascular;  Laterality: N/A;   CARDIAC CATHETERIZATION N/A 01/22/2015   Procedure: Coronary Stent Intervention;  Surgeon: Isaias Cowman, MD;  Location: [place] CV LAB;  Service: Cardiovascular;  Laterality: N/A;   CATARACT EXTRACTION, BILATERAL     COLONOSCOPY WITH PROPOFOL N/A 12/08/2015   Procedure: COLONOSCOPY WITH PROPOFOL;  Surgeon: Lollie Sails, MD;  Location: [facility] ENDOSCOPY;  Service: Endoscopy;  Laterality: N/A;   COLONOSCOPY WITH PROPOFOL N/A  12/09/2015   Procedure: COLONOSCOPY WITH PROPOFOL;  Surgeon: Lollie Sails, MD;  Location: [facility] ENDOSCOPY;  Service: Endoscopy;  Laterality: N/A;   CORONARY ANGIOPLASTY     5 stents   CORONARY STENT PLACEMENT  2015   x5   CYSTOSCOPY N/A 09/11/2018   Procedure: CYSTOSCOPY;  Surgeon: [name], MD;  Location: [facility] ORS;  Service: Urology;  Laterality: N/A;   CYSTOSCOPY WITH BIOPSY N/A 12/21/2017   Procedure: CYSTOSCOPY WITH BIOPSY;  Surgeon: [name], MD;  Location: [facility] ORS;  Service: Urology;  Laterality: N/A;   ESOPHAGOGASTRODUODENOSCOPY (EGD) WITH PROPOFOL N/A 12/08/2015   Procedure: ESOPHAGOGASTRODUODENOSCOPY (EGD) WITH PROPOFOL;  Surgeon: Lollie Sails, MD;  Location: [facility]  ENDOSCOPY;  Service: Endoscopy;  Laterality: N/A;   ESOPHAGOGASTRODUODENOSCOPY (EGD) WITH PROPOFOL N/A 12/13/2017   Procedure: ESOPHAGOGASTRODUODENOSCOPY (EGD) WITH PROPOFOL;  Surgeon: Lollie Sails, MD;  Location: [facility] ENDOSCOPY;  Service: Endoscopy;  Laterality: N/A;   EYE SURGERY     HERNIA REPAIR     kidney tumor remove     TRANSURETHRAL RESECTION OF BLADDER TUMOR WITH GYRUS (TURBT-GYRUS)  50/0938   UMBILICAL HERNIA REPAIR     urethral meatotomy      There were no vitals filed for this visit.   INTERVENTIONS  Neuromuscular Re-ed: Standing at support bar, CGA at all times for safety and mild steadying assist: - Static stance x60 seconds - Marching 2 x 60 seconds, no UE support  - Static stance on Airex pad, x60 seconds  - Step up to 4" step      -forward x15 reps each LE, no UE support     -lateral x15 reps each LE; SUE support of cane   Ambulation using SPC, emphasis on step length (BIG steps) and arm swing. Cane in RUE, L arm swing. 6 x 13ft  Education provided throughout session via VC/TC and demonstration to facilitate movement at target joints and correct muscle activation for all testing and exercises performed.    Clinical Impression: Pt arrives to session with his supportive wife. He remains motivated throughout session however somewhat fearful of placing full weight through LLE stating it felt like he was going to fall. Standing stability was initiated, challenge progressing within session. Seated rest breaks taken due to muscular fatigue and increase in back pain. During ambulation, he did respond well to visual cueing with PT walking in front with exaggerated step length and arm swings. Patient will benefit from skilled PT services to address these deficits, improve balance, and decrease risk for future falls.        PT Short Term Goals - 04/08/21 0911       PT SHORT TERM GOAL #1   Title Pt will be independent with INITIAL  HEP in order to improve strength  and balance in order to decrease fall risk and improve function at home and work.    Baseline 04/07/2021= No formal HEP in place    Time 6    Period Weeks    Status New    Target Date 05/19/21      PT SHORT TERM GOAL #2   Title Pt will decrease 5TSTS by at least 3 seconds in order to demonstrate clinically significant improvement in LE strength.    Baseline 04/07/2021= 25 sec    Time 6    Period Weeks    Status New    Target Date 06/30/21               PT Long Term Goals -  04/08/21 0905       PT LONG TERM GOAL #1   Title Pt will be independent with FINAL HEP in order to improve strength and balance in order to decrease fall risk and improve function at home and work.    Baseline 04/07/2021= No formal HEP in place.    Time 12    Period Weeks    Status New    Target Date 06/30/21      PT LONG TERM GOAL #2   Title Pt will improve FOTO to target score of 45% to display perceived improvements in ability to complete ADL's.    Baseline 2/7/22023= 41%    Time 12    Period Weeks    Status New    Target Date 06/30/21      PT LONG TERM GOAL #3   Title Pt will decrease 5TSTS by at least 5 seconds in order to demonstrate clinically significant improvement in LE strength.    Baseline 04/07/2021= 25 sec    Time 12    Period Weeks    Status New    Target Date 06/30/21      PT LONG TERM GOAL #4   Title Pt will decrease TUG to below 17 seconds/decrease in order to demonstrate decreased fall risk.    Baseline 04/07/2021= 21 sec with SPC    Time 12    Period Weeks    Status New    Target Date 06/30/21      PT LONG TERM GOAL #5   Title Pt will increase 10MWT by at least 0.15 m/s in order to demonstrate clinically significant improvement in community ambulation.    Baseline 04/07/2021= 0.56 m/s using SPC    Time 12    Period Weeks    Status New    Target Date 06/30/21                   Plan - 04/13/21 1340     Clinical Impression Statement Pt arrives to session with his  supportive wife. He remains motivated throughout session however somewhat fearful of placing full weight through LLE stating it felt like he was going to fall. Standing stability was initiated, challenge progressing within session. Seated rest breaks taken due to muscular fatigue and increase in back pain. During ambulation, he did respond well to visual cueing with PT walking in front with exaggerated step length and arm swings. Patient will benefit from skilled PT services to address these deficits, improve balance, and decrease risk for future falls.    Personal Factors and Comorbidities Comorbidity 3+    Comorbidities arthritis, bladder cancer, Trigeminal Neuralgia    Examination-Activity Limitations Caring for Others;Carry;Continence;Lift;Squat;Stairs;Stand    Examination-Participation Restrictions Community Activity;Yard Work    [employer] Evolving/Moderate complexity    Rehab Potential Good    PT Frequency 2x / week    PT Duration 12 weeks    PT Treatment/Interventions ADLs/Self Care Home Management;Cryotherapy;Canalith Repostioning;Electrical Stimulation;Moist Heat;DME Instruction;Gait training;Stair training;Functional mobility training;Therapeutic activities;Therapeutic exercise;Balance training;Neuromuscular re-education;Patient/family education;Manual techniques;Passive range of motion;Dry needling;Vestibular    PT Next Visit Plan LE strength, improve step length with ambulation, muscle tissue lengthening    PT Home Exercise Plan Access Code: [email or web address]             Patient will benefit from skilled therapeutic intervention in order to improve the following deficits and impairments:  Abnormal gait, Decreased activity tolerance, Decreased balance, Decreased coordination, Decreased endurance, Decreased mobility, Decreased range of motion, Decreased strength, Difficulty  walking, Hypomobility, Impaired flexibility  Visit Diagnosis: Difficulty in walking, not  elsewhere classified  Abnormality of gait and mobility  Muscle weakness (generalized)  Unsteadiness on feet     Problem List Patient Active Problem List   Diagnosis Date Noted   Class 2 obesity due to excess calories with body mass index (BMI) of 36.0 to 36.9 in adult 04/11/2020   Swelling of limb 03/28/2020   Lymphedema 03/28/2020   Trigeminal neuralgia 12/25/2019   Lower limb ulcer, calf, left, limited to breakdown of skin ([place]) 12/25/2019   OSA (obstructive sleep apnea) 00/34/9179   Acute systolic CHF (congestive heart failure) ([place]) 12/12/2018   Bruising 07/10/2018   Diet-controlled type 2 diabetes mellitus ([place]) 03/24/2018   Microalbuminuria 03/24/2018   Dizziness 11/17/2017   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   SOBOE (shortness of breath on exertion) 02/18/2017   Chronic midline low back pain without sciatica 12/29/2016   Periodic limb movement disorder 12/29/2016   Benign essential tremor 06/22/2016   Pure hypercholesterolemia 06/20/2015   Erectile dysfunction due to arterial insufficiency 06/16/2015   Unstable angina ([place]) 01/22/2015   Abdominal aortic 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 rupture 12/31/2014   Benign prostatic hypertrophy without urinary obstruction 07/31/2014   Enlarged prostate 07/31/2014   Benign essential HTN 06/11/2014   Carotid artery narrowing 02/08/2014   Carotid artery obstruction 02/08/2014   Bilateral carotid artery stenosis 02/08/2014   Chest pain 08/20/2013   Bilateral cataracts 05/16/2013   Cataract 05/16/2013   Clinical depression 12/12/2012   Diabetes mellitus, type 2 ([place]) 12/12/2012   Combined fat and carbohydrate induced hyperlipemia 12/12/2012   Major depressive disorder, single episode, unspecified 12/12/2012   Acid reflux 11/09/2012   Presence of stent in coronary artery 11/09/2012   BP (high blood pressure) 11/09/2012   Healed myocardial infarct 11/09/2012   Gastro-esophageal reflux disease without esophagitis 11/09/2012    History of cardiovascular surgery 11/09/2012   Fothergill's neuralgia 08/07/2012   Swelling of testicle 04/06/2012   Disorder of male genital organ 04/06/2012   Swelling of the testicles 04/06/2012   Arteriosclerosis of coronary artery 11/16/2011   CAD in native artery 11/16/2011   Fatigue 06/04/2011   Avitaminosis D 11/30/2010    Patrina Levering PT, DPT 04/13/21 1:43 PM [place], [place], [address] Phone: [phone]   Fax:  [phone]  Name: PETRO TALENT MRN: [id number] Date of Birth: [birth year]

## 2021-04-15 ENCOUNTER — Ambulatory Visit: Payer: Medicare HMO

## 2021-04-15 ENCOUNTER — Other Ambulatory Visit: Payer: Self-pay

## 2021-04-15 DIAGNOSIS — M6281 Muscle weakness (generalized): Secondary | ICD-10-CM

## 2021-04-15 DIAGNOSIS — R262 Difficulty in walking, not elsewhere classified: Secondary | ICD-10-CM

## 2021-04-15 DIAGNOSIS — R269 Unspecified abnormalities of gait and mobility: Secondary | ICD-10-CM

## 2021-04-15 DIAGNOSIS — R2681 Unsteadiness on feet: Secondary | ICD-10-CM

## 2021-04-15 NOTE — Therapy (Signed)
[place] MAIN [facility] SERVICES [address] [place], [place], [address] Phone: [phone]   Fax:  [phone]  Physical Therapy Treatment  Patient Details  Name: [name] MRN: [id number] Date of Birth: April 15, 1945 Referring Provider (PT): Dr. Gurney Maxin   Encounter Date: 04/15/2021   PT End of Session - 04/15/21 0940     Visit Number 4    Number of Visits 25    Date for PT Re-Evaluation 06/30/21    Authorization Time Period Initial Certification= 08/05/5991- 06/30/2021    PT Start Time 0932    PT Stop Time 1013    PT Time Calculation (min) 41 min    Equipment Utilized During Treatment Gait belt    Activity Tolerance Patient tolerated treatment well    Behavior During Therapy [facility] for tasks assessed/performed             Past Medical History:  Diagnosis Date   Anxiety    Arteriosclerosis of coronary artery 11/16/2011   Overview:  Stent 10/2011 stent rca 2015 with collaterals to lad which is chronically occluded    Benign enlargement of prostate    Benign essential HTN 06/11/2014   Benign prostatic hypertrophy without urinary obstruction 07/31/2014   Bilateral cataracts 05/16/2013   Overview:  Dr. [facility]     Bone spur of foot    Left   BP (high blood pressure) 11/09/2012   Cancer ([place])    skin (forehead) and bladder   Carotid artery narrowing 02/08/2014   Depression    Detrusor hypertrophy    Diabetes ([place])    Diabetes mellitus, type 2 ([place]) 12/12/2012   Diverticulosis    Dyspnea    Esophageal reflux    Esophageal reflux    Fothergill's neuralgia 08/07/2012   Overview:  [facility]    Gastritis    GERD (gastroesophageal reflux disease)    Headache    cluster headaches   Healed myocardial infarct 11/09/2012   Hearing loss in left ear    Heart disease    Hematuria    Hemorrhoids    History of hiatal hernia 12/14/2017   small    Hypercholesteremia    Lesion of bladder    Myocardial infarct ([place])     Presence of stent in coronary artery 11/09/2012   Rectal bleeding    Trigeminal neuralgia    Trigeminal neuralgia    Valvular heart disease    Vitamin D deficiency     Past Surgical History:  Procedure Laterality Date   APPENDECTOMY     BOTOX INJECTION N/A 12/21/2017   Procedure: Bladder BOTOX INJECTION;  Surgeon: [name], MD;  Location: [facility] ORS;  Service: Urology;  Laterality: N/A;   BOTOX INJECTION N/A 09/11/2018   Procedure: Bladder BOTOX INJECTION;  Surgeon: [name], MD;  Location: [facility] ORS;  Service: Urology;  Laterality: N/A;   CARDIAC CATHETERIZATION     CARDIAC CATHETERIZATION N/A 01/22/2015   Procedure: Left Heart Cath;  Surgeon: Corey Skains, MD;  Location: [place] CV LAB;  Service: Cardiovascular;  Laterality: N/A;   CARDIAC CATHETERIZATION N/A 01/22/2015   Procedure: Coronary Stent Intervention;  Surgeon: Isaias Cowman, MD;  Location: [place] CV LAB;  Service: Cardiovascular;  Laterality: N/A;   CATARACT EXTRACTION, BILATERAL     COLONOSCOPY WITH PROPOFOL N/A 12/08/2015   Procedure: COLONOSCOPY WITH PROPOFOL;  Surgeon: Lollie Sails, MD;  Location: [facility] ENDOSCOPY;  Service: Endoscopy;  Laterality: N/A;   COLONOSCOPY WITH PROPOFOL N/A  12/09/2015   Procedure: COLONOSCOPY WITH PROPOFOL;  Surgeon: Lollie Sails, MD;  Location: [facility] ENDOSCOPY;  Service: Endoscopy;  Laterality: N/A;   CORONARY ANGIOPLASTY     5 stents   CORONARY STENT PLACEMENT  2015   x5   CYSTOSCOPY N/A 09/11/2018   Procedure: CYSTOSCOPY;  Surgeon: [name], MD;  Location: [facility] ORS;  Service: Urology;  Laterality: N/A;   CYSTOSCOPY WITH BIOPSY N/A 12/21/2017   Procedure: CYSTOSCOPY WITH BIOPSY;  Surgeon: [name], MD;  Location: [facility] ORS;  Service: Urology;  Laterality: N/A;   ESOPHAGOGASTRODUODENOSCOPY (EGD) WITH PROPOFOL N/A 12/08/2015   Procedure: ESOPHAGOGASTRODUODENOSCOPY (EGD) WITH PROPOFOL;  Surgeon: Lollie Sails, MD;  Location: [facility]  ENDOSCOPY;  Service: Endoscopy;  Laterality: N/A;   ESOPHAGOGASTRODUODENOSCOPY (EGD) WITH PROPOFOL N/A 12/13/2017   Procedure: ESOPHAGOGASTRODUODENOSCOPY (EGD) WITH PROPOFOL;  Surgeon: Lollie Sails, MD;  Location: [facility] ENDOSCOPY;  Service: Endoscopy;  Laterality: N/A;   EYE SURGERY     HERNIA REPAIR     kidney tumor remove     TRANSURETHRAL RESECTION OF BLADDER TUMOR WITH GYRUS (TURBT-GYRUS)  61/9509   UMBILICAL HERNIA REPAIR     urethral meatotomy      There were no vitals filed for this visit.   Subjective Assessment - 04/15/21 0938     Subjective Patient reports continued low back pain on 6/10 but states just bought a new mattress and hoping that will help.    Patient is accompained by: Family member    Pertinent History Patient is a 76 year old male with referral for Physical Therapy with diagnosis of Parkinsons disease and history of falling. He reports he diagnosed last year with Parkinsons and has improved his tremors with use of medication but reports worsening shuffling and difficulty with mobility. Patient has past medical history signifiant for Parkinsons; Arthritis, bladder cancer, Trigemic Neuralgia. He lives with wife in 1 level home and current using cane with reported recent fall.    Limitations Walking;Lifting;Standing;House hold activities    How long can you sit comfortably? No limits    How long can you stand comfortably? 15 min- limited due to back pain and foot numbess    How long can you walk comfortably? about 80 feet    Patient Stated Goals Improve my balance, be able to get up off the floor so I can maybe work in my garage again.    Pain Descriptors / Indicators Aching;Sore               INTERVENTIONS:   Therapeutic Exercises:   Step tap onto 1st step- BUE Support x 15 reps each (no LOB)   Heel to toe sequence using 2 hedgehogs for reference x 20 reps each leg. Patient fatigued requiring VC for placement and rest break upon completion.   Gait  focusing on heel to toe sequencing - 175 feet today with repetitive reminders to increase step length- Only little carryover from heel to toe activity.   Lumbar flex (seated with use of theraball) x 20 reps. Instructed to perform slowly. Patient reported feeling a good stretch.   Seated Hamstring stretch -hold 20 sec x 3 each LE (Patient with limited ROM which did improve with stretching)   Sit to stand x 10 reps without UE support x 5 and with min UE support x 5 reps. Patient fatigued requiring brief rest break.   Attempted seated Piriformis - patient unable to attain the position.    Education provided throughout session via VC/TC and demonstration  to facilitate movement at target joints and correct muscle activation for all testing and exercises performed.                PT Education - 04/15/21 0940     Education Details Exercise technique    Person(s) Educated Patient    Methods Explanation;Demonstration;Tactile cues;Verbal cues    Comprehension Verbalized understanding;Returned demonstration;Verbal cues required;Tactile cues required;Need further instruction              PT Short Term Goals - 04/08/21 0911       PT SHORT TERM GOAL #1   Title Pt will be independent with INITIAL  HEP in order to improve strength and balance in order to decrease fall risk and improve function at home and work.    Baseline 04/07/2021= No formal HEP in place    Time 6    Period Weeks    Status New    Target Date 05/19/21      PT SHORT TERM GOAL #2   Title Pt will decrease 5TSTS by at least 3 seconds in order to demonstrate clinically significant improvement in LE strength.    Baseline 04/07/2021= 25 sec    Time 6    Period Weeks    Status New    Target Date 06/30/21               PT Long Term Goals - 04/08/21 0905       PT LONG TERM GOAL #1   Title Pt will be independent with FINAL HEP in order to improve strength and balance in order to decrease fall risk and improve  function at home and work.    Baseline 04/07/2021= No formal HEP in place.    Time 12    Period Weeks    Status New    Target Date 06/30/21      PT LONG TERM GOAL #2   Title Pt will improve FOTO to target score of 45% to display perceived improvements in ability to complete ADL's.    Baseline 2/7/22023= 41%    Time 12    Period Weeks    Status New    Target Date 06/30/21      PT LONG TERM GOAL #3   Title Pt will decrease 5TSTS by at least 5 seconds in order to demonstrate clinically significant improvement in LE strength.    Baseline 04/07/2021= 25 sec    Time 12    Period Weeks    Status New    Target Date 06/30/21      PT LONG TERM GOAL #4   Title Pt will decrease TUG to below 17 seconds/decrease in order to demonstrate decreased fall risk.    Baseline 04/07/2021= 21 sec with SPC    Time 12    Period Weeks    Status New    Target Date 06/30/21      PT LONG TERM GOAL #5   Title Pt will increase 10MWT by at least 0.15 m/s in order to demonstrate clinically significant improvement in community ambulation.    Baseline 04/07/2021= 0.56 m/s using SPC    Time 12    Period Weeks    Status New    Target Date 06/30/21                   Plan - 04/15/21 0941     Clinical Impression Statement Patient presents with good motivation today yet ongoing low back pain. He was receptive to all  Verbal cueing today yet experienced difficulty with consistent heel to toe sequencing with activity and gait today. He fatigued quickly requiring rest breaks but able to participate in stretching/strengthening regimen today. Patient will benefit from skilled PT services to address these deficits, improve balance, and decrease risk for future falls    Personal Factors and Comorbidities Comorbidity 3+    Comorbidities arthritis, bladder cancer, Trigeminal Neuralgia    Examination-Activity Limitations Caring for Others;Carry;Continence;Lift;Squat;Stairs;Stand    Examination-Participation Restrictions  Community Activity;Yard Work    [employer] Evolving/Moderate complexity    Rehab Potential Good    PT Frequency 2x / week    PT Duration 12 weeks    PT Treatment/Interventions ADLs/Self Care Home Management;Cryotherapy;Canalith Repostioning;Electrical Stimulation;Moist Heat;DME Instruction;Gait training;Stair training;Functional mobility training;Therapeutic activities;Therapeutic exercise;Balance training;Neuromuscular re-education;Patient/family education;Manual techniques;Passive range of motion;Dry needling;Vestibular    PT Next Visit Plan LE strength, improve step length with ambulation, muscle tissue lengthening    PT Home Exercise Plan Access Code: [email or web address]             Patient will benefit from skilled therapeutic intervention in order to improve the following deficits and impairments:  Abnormal gait, Decreased activity tolerance, Decreased balance, Decreased coordination, Decreased endurance, Decreased mobility, Decreased range of motion, Decreased strength, Difficulty walking, Hypomobility, Impaired flexibility  Visit Diagnosis: Abnormality of gait and mobility  Difficulty in walking, not elsewhere classified  Muscle weakness (generalized)  Unsteadiness on feet     Problem List Patient Active Problem List   Diagnosis Date Noted   Class 2 obesity due to excess calories with body mass index (BMI) of 36.0 to 36.9 in adult 04/11/2020   Swelling of limb 03/28/2020   Lymphedema 03/28/2020   Trigeminal neuralgia 12/25/2019   Lower limb ulcer, calf, left, limited to breakdown of skin ([place]) 12/25/2019   OSA (obstructive sleep apnea) 78/29/5621   Acute systolic CHF (congestive heart failure) ([place]) 12/12/2018   Bruising 07/10/2018   Diet-controlled type 2 diabetes mellitus ([place]) 03/24/2018   Microalbuminuria 03/24/2018   Dizziness 11/17/2017   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   SOBOE (shortness of breath on exertion) 02/18/2017   Chronic  midline low back pain without sciatica 12/29/2016   Periodic limb movement disorder 12/29/2016   Benign essential tremor 06/22/2016   Pure hypercholesterolemia 06/20/2015   Erectile dysfunction due to arterial insufficiency 06/16/2015   Unstable angina ([place]) 01/22/2015   Abdominal aortic 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 rupture 12/31/2014   Benign prostatic hypertrophy without urinary obstruction 07/31/2014   Enlarged prostate 07/31/2014   Benign essential HTN 06/11/2014   Carotid artery narrowing 02/08/2014   Carotid artery obstruction 02/08/2014   Bilateral carotid artery stenosis 02/08/2014   Chest pain 08/20/2013   Bilateral cataracts 05/16/2013   Cataract 05/16/2013   Clinical depression 12/12/2012   Diabetes mellitus, type 2 ([place]) 12/12/2012   Combined fat and carbohydrate induced hyperlipemia 12/12/2012   Major depressive disorder, single episode, unspecified 12/12/2012   Acid reflux 11/09/2012   Presence of stent in coronary artery 11/09/2012   BP (high blood pressure) 11/09/2012   Healed myocardial infarct 11/09/2012   Gastro-esophageal reflux disease without esophagitis 11/09/2012   History of cardiovascular surgery 11/09/2012   Fothergill's neuralgia 08/07/2012   Swelling of testicle 04/06/2012   Disorder of male genital organ 04/06/2012   Swelling of the testicles 04/06/2012   Arteriosclerosis of coronary artery 11/16/2011   CAD in native artery 11/16/2011   Fatigue 06/04/2011   Avitaminosis D 11/30/2010    Lewis Moccasin, PT 04/15/2021, 7:35  PM  [employer] MAIN [facility] SERVICES [address] [place], [place], [address] Phone: [phone]   Fax:  [phone]  Name: [name] MRN: [id number] Date of Birth: 06-20-1945

## 2021-04-21 ENCOUNTER — Other Ambulatory Visit: Payer: Self-pay

## 2021-04-21 ENCOUNTER — Ambulatory Visit: Payer: Medicare HMO

## 2021-04-21 DIAGNOSIS — M6281 Muscle weakness (generalized): Secondary | ICD-10-CM

## 2021-04-21 DIAGNOSIS — R269 Unspecified abnormalities of gait and mobility: Secondary | ICD-10-CM

## 2021-04-21 DIAGNOSIS — R262 Difficulty in walking, not elsewhere classified: Secondary | ICD-10-CM | POA: Diagnosis not present

## 2021-04-21 DIAGNOSIS — R2681 Unsteadiness on feet: Secondary | ICD-10-CM

## 2021-04-21 NOTE — Therapy (Signed)
[place] SERVICES [address] [place], [place], [address] Phone: [phone]   Fax:  [phone]  Physical Therapy Treatment  Patient Details  Name: [name] MRN: [id number] Date of Birth: February 20, 1946 Referring Provider (PT): Dr. Gurney Maxin   Encounter Date: 04/21/2021   PT End of Session - 04/21/21 1432     Visit Number 5    Number of Visits 25    Date for PT Re-Evaluation 06/30/21    Authorization Time Period Initial Certification= 06/08/4494- 06/30/2021    PT Start Time 1300    PT Stop Time 1345    PT Time Calculation (min) 45 min    Equipment Utilized During Treatment Gait belt    Activity Tolerance Patient tolerated treatment well    Behavior During Therapy [facility] for tasks assessed/performed             Past Medical History:  Diagnosis Date   Anxiety    Arteriosclerosis of coronary artery 11/16/2011   Overview:  Stent 10/2011 stent rca 2015 with collaterals to lad which is chronically occluded    Benign enlargement of prostate    Benign essential HTN 06/11/2014   Benign prostatic hypertrophy without urinary obstruction 07/31/2014   Bilateral cataracts 05/16/2013   Overview:  Dr. [facility]     Bone spur of foot    Left   BP (high blood pressure) 11/09/2012   Cancer ([place])    skin (forehead) and bladder   Carotid artery narrowing 02/08/2014   Depression    Detrusor hypertrophy    Diabetes ([place])    Diabetes mellitus, type 2 ([place]) 12/12/2012   Diverticulosis    Dyspnea    Esophageal reflux    Esophageal reflux    Fothergill's neuralgia 08/07/2012   Overview:  [facility]    Gastritis    GERD (gastroesophageal reflux disease)    Headache    cluster headaches   Healed myocardial infarct 11/09/2012   Hearing loss in left ear    Heart disease    Hematuria    Hemorrhoids    History of hiatal hernia 12/14/2017   small    Hypercholesteremia    Lesion of bladder    Myocardial infarct (HCC)     Presence of stent in coronary artery 11/09/2012   Rectal bleeding    Trigeminal neuralgia    Trigeminal neuralgia    Valvular heart disease    Vitamin D deficiency     Past Surgical History:  Procedure Laterality Date   APPENDECTOMY     BOTOX INJECTION N/A 12/21/2017   Procedure: Bladder BOTOX INJECTION;  Surgeon: [name], MD;  Location: [facility] ORS;  Service: Urology;  Laterality: N/A;   BOTOX INJECTION N/A 09/11/2018   Procedure: Bladder BOTOX INJECTION;  Surgeon: [name], MD;  Location: [facility] ORS;  Service: Urology;  Laterality: N/A;   CARDIAC CATHETERIZATION     CARDIAC CATHETERIZATION N/A 01/22/2015   Procedure: Left Heart Cath;  Surgeon: Corey Skains, MD;  Location: [place] CV LAB;  Service: Cardiovascular;  Laterality: N/A;   CARDIAC CATHETERIZATION N/A 01/22/2015   Procedure: Coronary Stent Intervention;  Surgeon: Isaias Cowman, MD;  Location: [place] CV LAB;  Service: Cardiovascular;  Laterality: N/A;   CATARACT EXTRACTION, BILATERAL     COLONOSCOPY WITH PROPOFOL N/A 12/08/2015   Procedure: COLONOSCOPY WITH PROPOFOL;  Surgeon: Lollie Sails, MD;  Location: [facility] ENDOSCOPY;  Service: Endoscopy;  Laterality: N/A;   COLONOSCOPY WITH PROPOFOL N/A  12/09/2015   Procedure: COLONOSCOPY WITH PROPOFOL;  Surgeon: Lollie Sails, MD;  Location: [facility] ENDOSCOPY;  Service: Endoscopy;  Laterality: N/A;   CORONARY ANGIOPLASTY     5 stents   CORONARY STENT PLACEMENT  2015   x5   CYSTOSCOPY N/A 09/11/2018   Procedure: CYSTOSCOPY;  Surgeon: [name], MD;  Location: [facility] ORS;  Service: Urology;  Laterality: N/A;   CYSTOSCOPY WITH BIOPSY N/A 12/21/2017   Procedure: CYSTOSCOPY WITH BIOPSY;  Surgeon: [name], MD;  Location: [facility] ORS;  Service: Urology;  Laterality: N/A;   ESOPHAGOGASTRODUODENOSCOPY (EGD) WITH PROPOFOL N/A 12/08/2015   Procedure: ESOPHAGOGASTRODUODENOSCOPY (EGD) WITH PROPOFOL;  Surgeon: Lollie Sails, MD;  Location: [facility]  ENDOSCOPY;  Service: Endoscopy;  Laterality: N/A;   ESOPHAGOGASTRODUODENOSCOPY (EGD) WITH PROPOFOL N/A 12/13/2017   Procedure: ESOPHAGOGASTRODUODENOSCOPY (EGD) WITH PROPOFOL;  Surgeon: Lollie Sails, MD;  Location: [facility] ENDOSCOPY;  Service: Endoscopy;  Laterality: N/A;   EYE SURGERY     HERNIA REPAIR     kidney tumor remove     TRANSURETHRAL RESECTION OF BLADDER TUMOR WITH GYRUS (TURBT-GYRUS)  61/6073   UMBILICAL HERNIA REPAIR     urethral meatotomy      There were no vitals filed for this visit.   Subjective Assessment - 04/21/21 1308     Subjective Patient reports his back is really bothering him today- States he had to use some heat on the way over.    Patient is accompained by: Family member    Pertinent History Patient is a 76 year old male with referral for Physical Therapy with diagnosis of Parkinsons disease and history of falling. He reports he diagnosed last year with Parkinsons and has improved his tremors with use of medication but reports worsening shuffling and difficulty with mobility. Patient has past medical history signifiant for Parkinsons; Arthritis, bladder cancer, Trigemic Neuralgia. He lives with wife in 1 level home and current using cane with reported recent fall.    Limitations Walking;Lifting;Standing;House hold activities    How long can you sit comfortably? No limits    How long can you stand comfortably? 15 min- limited due to back pain and foot numbess    How long can you walk comfortably? about 80 feet    Patient Stated Goals Improve my balance, be able to get up off the floor so I can maybe work in my garage again.    Currently in Pain? Yes    Pain Score 8     Pain Location Back    Pain Orientation Posterior;Lower    Pain Descriptors / Indicators Aching;Sore    Pain Onset More than a month ago    Pain Frequency Intermittent    Aggravating Factors  prolonged walking, standing    Pain Relieving Factors rest    Multiple Pain Sites No             INTERVENTIONS:   Manual therapy: (all below exercises perform performed supine while resting on moist heat pad at low back)   - B hamstrings (Hooklye position)- hold 30 sec x 4 each Leg - Single knee to chest - Hold 30 sec x 4 each Leg - Lower trunk rotation- Hold 30 sec x 4 left and right- each Leg - Piriformis- PT assisted on right as leg was too tight to place over his left LE.  - Hip ER/IR- BLE  x 10 sec hold x 5 each leg. Right LE significantly tighter than left.   Therapeutic Exercises:  Instructed patient in  self stretching after manual stretching:   HS with Strap x 20 sec x 3 BLE Single knee to chest  with strap x 20 sec x 2 sets BLE Active LTR- x 10 each side    Access Code: [email or web address] URL: [email or web address] Date: 04/21/2021 Prepared by: Sande Brothers  Exercises Hooklying Hamstring Stretch with Strap - 1 x daily - 7 x weekly - 3 sets - 20-30 sec hold Hooklying Single Knee to Chest Stretch with Towel - 1 x daily - 7 x weekly - 3 sets - 20-30 sec hold Supine Lower Trunk Rotation with PLB - 1 x daily - 7 x weekly - 3 sets - 20-30 hold    Education provided throughout session via VC/TC and demonstration to facilitate movement at target joints and correct muscle activation for all testing and exercises performed.                      PT Education - 04/21/21 1310     Education Details Exercise technique    Person(s) Educated Patient    Methods Explanation;Demonstration;Tactile cues;Verbal cues    Comprehension Verbalized understanding;Returned demonstration;Tactile cues required;Need further instruction;Verbal cues required              PT Short Term Goals - 04/08/21 0911       PT SHORT TERM GOAL #1   Title Pt will be independent with INITIAL  HEP in order to improve strength and balance in order to decrease fall risk and improve function at home and work.    Baseline 04/07/2021= No formal HEP in place    Time 6     Period Weeks    Status New    Target Date 05/19/21      PT SHORT TERM GOAL #2   Title Pt will decrease 5TSTS by at least 3 seconds in order to demonstrate clinically significant improvement in LE strength.    Baseline 04/07/2021= 25 sec    Time 6    Period Weeks    Status New    Target Date 06/30/21               PT Long Term Goals - 04/08/21 0905       PT LONG TERM GOAL #1   Title Pt will be independent with FINAL HEP in order to improve strength and balance in order to decrease fall risk and improve function at home and work.    Baseline 04/07/2021= No formal HEP in place.    Time 12    Period Weeks    Status New    Target Date 06/30/21      PT LONG TERM GOAL #2   Title Pt will improve FOTO to target score of 45% to display perceived improvements in ability to complete ADL's.    Baseline 2/7/22023= 41%    Time 12    Period Weeks    Status New    Target Date 06/30/21      PT LONG TERM GOAL #3   Title Pt will decrease 5TSTS by at least 5 seconds in order to demonstrate clinically significant improvement in LE strength.    Baseline 04/07/2021= 25 sec    Time 12    Period Weeks    Status New    Target Date 06/30/21      PT LONG TERM GOAL #4   Title Pt will decrease TUG to below 17 seconds/decrease in order to demonstrate decreased fall risk.    Baseline 04/07/2021=  21 sec with SPC    Time 12    Period Weeks    Status New    Target Date 06/30/21      PT LONG TERM GOAL #5   Title Pt will increase 10MWT by at least 0.15 m/s in order to demonstrate clinically significant improvement in community ambulation.    Baseline 04/07/2021= 0.56 m/s using SPC    Time 12    Period Weeks    Status New    Target Date 06/30/21                   Plan - 04/21/21 1358     Clinical Impression Statement Patient presents with excellent motivation but low back pain limited today. Today- focused on manual stretching due to overall tightness in low back/trunk/LE- He was responsive  to all cues and relaxed during stretching. He reported feeling better after all stretching and able to return demo of self stretching for HEP. Patient will benefit from skilled PT services to address these deficits, improve balance, and decrease risk for future falls    Personal Factors and Comorbidities Comorbidity 3+    Comorbidities arthritis, bladder cancer, Trigeminal Neuralgia    Examination-Activity Limitations Caring for Others;Carry;Continence;Lift;Squat;Stairs;Stand    Examination-Participation Restrictions Community Activity;Yard Work    [employer] Evolving/Moderate complexity    Rehab Potential Good    PT Frequency 2x / week    PT Duration 12 weeks    PT Treatment/Interventions ADLs/Self Care Home Management;Cryotherapy;Canalith Repostioning;Electrical Stimulation;Moist Heat;DME Instruction;Gait training;Stair training;Functional mobility training;Therapeutic activities;Therapeutic exercise;Balance training;Neuromuscular re-education;Patient/family education;Manual techniques;Passive range of motion;Dry needling;Vestibular    PT Next Visit Plan LE strength, improve step length with ambulation, muscle tissue lengthening    PT Home Exercise Plan Access Code: [email or web address]             Patient will benefit from skilled therapeutic intervention in order to improve the following deficits and impairments:  Abnormal gait, Decreased activity tolerance, Decreased balance, Decreased coordination, Decreased endurance, Decreased mobility, Decreased range of motion, Decreased strength, Difficulty walking, Hypomobility, Impaired flexibility  Visit Diagnosis: Abnormality of gait and mobility  Difficulty in walking, not elsewhere classified  Muscle weakness (generalized)  Unsteadiness on feet     Problem List Patient Active Problem List   Diagnosis Date Noted   Class 2 obesity due to excess calories with body mass index (BMI) of 36.0 to 36.9 in adult 04/11/2020    Swelling of limb 03/28/2020   Lymphedema 03/28/2020   Trigeminal neuralgia 12/25/2019   Lower limb ulcer, calf, left, limited to breakdown of skin ([place]) 12/25/2019   OSA (obstructive sleep apnea) 28/31/5176   Acute systolic CHF (congestive heart failure) ([place]) 12/12/2018   Bruising 07/10/2018   Diet-controlled type 2 diabetes mellitus ([place]) 03/24/2018   Microalbuminuria 03/24/2018   Dizziness 11/17/2017   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   SOBOE (shortness of breath on exertion) 02/18/2017   Chronic midline low back pain without sciatica 12/29/2016   Periodic limb movement disorder 12/29/2016   Benign essential tremor 06/22/2016   Pure hypercholesterolemia 06/20/2015   Erectile dysfunction due to arterial insufficiency 06/16/2015   Unstable angina ([place]) 01/22/2015   Abdominal aortic 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 rupture 12/31/2014   Benign prostatic hypertrophy without urinary obstruction 07/31/2014   Enlarged prostate 07/31/2014   Benign essential HTN 06/11/2014   Carotid artery narrowing 02/08/2014   Carotid artery obstruction 02/08/2014   Bilateral carotid artery stenosis 02/08/2014   Chest pain 08/20/2013   Bilateral cataracts 05/16/2013  Cataract 05/16/2013   Clinical depression 12/12/2012   Diabetes mellitus, type 2 ([place]) 12/12/2012   Combined fat and carbohydrate induced hyperlipemia 12/12/2012   Major depressive disorder, single episode, unspecified 12/12/2012   Acid reflux 11/09/2012   Presence of stent in coronary artery 11/09/2012   BP (high blood pressure) 11/09/2012   Healed myocardial infarct 11/09/2012   Gastro-esophageal reflux disease without esophagitis 11/09/2012   History of cardiovascular surgery 11/09/2012   Fothergill's neuralgia 08/07/2012   Swelling of testicle 04/06/2012   Disorder of male genital organ 04/06/2012   Swelling of the testicles 04/06/2012   Arteriosclerosis of coronary artery 11/16/2011   CAD in native artery 11/16/2011   Fatigue  06/04/2011   Avitaminosis D 11/30/2010    Lewis Moccasin, PT 04/21/2021, 3:38 PM  [place] [facility] SERVICES [address] [place], [place], [address] Phone: [phone]   Fax:  [phone]  Name: [name] MRN: [id number] Date of Birth: 1945/08/02

## 2021-04-23 ENCOUNTER — Other Ambulatory Visit: Payer: Self-pay

## 2021-04-23 ENCOUNTER — Ambulatory Visit: Payer: Medicare HMO

## 2021-04-23 DIAGNOSIS — M6281 Muscle weakness (generalized): Secondary | ICD-10-CM

## 2021-04-23 DIAGNOSIS — R2681 Unsteadiness on feet: Secondary | ICD-10-CM

## 2021-04-23 DIAGNOSIS — R262 Difficulty in walking, not elsewhere classified: Secondary | ICD-10-CM | POA: Diagnosis not present

## 2021-04-23 DIAGNOSIS — R269 Unspecified abnormalities of gait and mobility: Secondary | ICD-10-CM

## 2021-04-23 NOTE — Therapy (Signed)
[place] MAIN [facility] SERVICES [address] [place], [place], [address] Phone: [phone]   Fax:  [phone]  Physical Therapy Treatment  Patient Details  Name: [name] MRN: [id number] Date of Birth: [birth year] Referring Provider (PT): Dr. Gurney Maxin   Encounter Date: 04/23/2021   PT End of Session - 04/23/21 1308     Visit Number 6    Number of Visits 25    Date for PT Re-Evaluation 06/30/21    Authorization Time Period Initial Certification= 10/03/1658- 06/30/2021    PT Start Time 1146    PT Stop Time 1228    PT Time Calculation (min) 42 min    Equipment Utilized During Treatment Gait belt    Activity Tolerance Patient tolerated treatment well    Behavior During Therapy [facility] for tasks assessed/performed             Past Medical History:  Diagnosis Date   Anxiety    Arteriosclerosis of coronary artery 11/16/2011   Overview:  Stent 10/2011 stent rca 2015 with collaterals to lad which is chronically occluded    Benign enlargement of prostate    Benign essential HTN 06/11/2014   Benign prostatic hypertrophy without urinary obstruction 07/31/2014   Bilateral cataracts 05/16/2013   Overview:  Dr. [facility]     Bone spur of foot    Left   BP (high blood pressure) 11/09/2012   Cancer ([place])    skin (forehead) and bladder   Carotid artery narrowing 02/08/2014   Depression    Detrusor hypertrophy    Diabetes ([place])    Diabetes mellitus, type 2 ([place]) 12/12/2012   Diverticulosis    Dyspnea    Esophageal reflux    Esophageal reflux    Fothergill's neuralgia 08/07/2012   Overview:  [facility]    Gastritis    GERD (gastroesophageal reflux disease)    Headache    cluster headaches   Healed myocardial infarct 11/09/2012   Hearing loss in left ear    Heart disease    Hematuria    Hemorrhoids    History of hiatal hernia 12/14/2017   small    Hypercholesteremia    Lesion of bladder    Myocardial infarct (HCC)     Presence of stent in coronary artery 11/09/2012   Rectal bleeding    Trigeminal neuralgia    Trigeminal neuralgia    Valvular heart disease    Vitamin D deficiency     Past Surgical History:  Procedure Laterality Date   APPENDECTOMY     BOTOX INJECTION N/A 12/21/2017   Procedure: Bladder BOTOX INJECTION;  Surgeon: [name], MD;  Location: [facility] ORS;  Service: Urology;  Laterality: N/A;   BOTOX INJECTION N/A 09/11/2018   Procedure: Bladder BOTOX INJECTION;  Surgeon: [name], MD;  Location: [facility] ORS;  Service: Urology;  Laterality: N/A;   CARDIAC CATHETERIZATION     CARDIAC CATHETERIZATION N/A 01/22/2015   Procedure: Left Heart Cath;  Surgeon: Corey Skains, MD;  Location: [place] CV LAB;  Service: Cardiovascular;  Laterality: N/A;   CARDIAC CATHETERIZATION N/A 01/22/2015   Procedure: Coronary Stent Intervention;  Surgeon: Isaias Cowman, MD;  Location: [place] CV LAB;  Service: Cardiovascular;  Laterality: N/A;   CATARACT EXTRACTION, BILATERAL     COLONOSCOPY WITH PROPOFOL N/A 12/08/2015   Procedure: COLONOSCOPY WITH PROPOFOL;  Surgeon: Lollie Sails, MD;  Location: [facility] ENDOSCOPY;  Service: Endoscopy;  Laterality: N/A;   COLONOSCOPY WITH PROPOFOL N/A  12/09/2015   Procedure: COLONOSCOPY WITH PROPOFOL;  Surgeon: Lollie Sails, MD;  Location: [facility] ENDOSCOPY;  Service: Endoscopy;  Laterality: N/A;   CORONARY ANGIOPLASTY     5 stents   CORONARY STENT PLACEMENT  2015   x5   CYSTOSCOPY N/A 09/11/2018   Procedure: CYSTOSCOPY;  Surgeon: [name], MD;  Location: [facility] ORS;  Service: Urology;  Laterality: N/A;   CYSTOSCOPY WITH BIOPSY N/A 12/21/2017   Procedure: CYSTOSCOPY WITH BIOPSY;  Surgeon: [name], MD;  Location: [facility] ORS;  Service: Urology;  Laterality: N/A;   ESOPHAGOGASTRODUODENOSCOPY (EGD) WITH PROPOFOL N/A 12/08/2015   Procedure: ESOPHAGOGASTRODUODENOSCOPY (EGD) WITH PROPOFOL;  Surgeon: Lollie Sails, MD;  Location: [facility]  ENDOSCOPY;  Service: Endoscopy;  Laterality: N/A;   ESOPHAGOGASTRODUODENOSCOPY (EGD) WITH PROPOFOL N/A 12/13/2017   Procedure: ESOPHAGOGASTRODUODENOSCOPY (EGD) WITH PROPOFOL;  Surgeon: Lollie Sails, MD;  Location: [facility] ENDOSCOPY;  Service: Endoscopy;  Laterality: N/A;   EYE SURGERY     HERNIA REPAIR     kidney tumor remove     TRANSURETHRAL RESECTION OF BLADDER TUMOR WITH GYRUS (TURBT-GYRUS)  69/6789   UMBILICAL HERNIA REPAIR     urethral meatotomy      There were no vitals filed for this visit.   Subjective Assessment - 04/23/21 1145     Subjective Patient reports his back is still bothering him but that is "nothing new"    Patient is accompained by: Family member    Pertinent History Patient is a 76 year old male with referral for Physical Therapy with diagnosis of Parkinsons disease and history of falling. He reports he diagnosed last year with Parkinsons and has improved his tremors with use of medication but reports worsening shuffling and difficulty with mobility. Patient has past medical history signifiant for Parkinsons; Arthritis, bladder cancer, Trigemic Neuralgia. He lives with wife in 1 level home and current using cane with reported recent fall.    Limitations Walking;Lifting;Standing;House hold activities    How long can you sit comfortably? No limits    How long can you stand comfortably? 15 min- limited due to back pain and foot numbess    How long can you walk comfortably? about 80 feet    Patient Stated Goals Improve my balance, be able to get up off the floor so I can maybe work in my garage again.    Currently in Pain? Yes    Pain Score 7     Pain Location Back    Pain Orientation Posterior;Lower    Pain Descriptors / Indicators Aching;Sore    Pain Type Chronic pain    Pain Onset More than a month ago    Pain Frequency Intermittent    Aggravating Factors  prolonged walking, standing    Pain Relieving Factors Rest             INTERVENTIONS:     Ladder drill- 1 foot per square focusing on reciprocal steps with 1 UE support down and back in // bars x 6 times.   Ambulation with 10 meter walk test- 1st trial with no VC- 34.43 sec using SPC and counted 52 steps 2nd trial with VC for increased steps using SPC and counted 32 steps in 22 sec.   proprioception - standing placing foot on top of color cone (PT called out which leg and color and patient responded by placing corresponding leg onto appropriate cone. Patient has 100% accuracy  Heel to toe sequencing in // bars: using 2 cones (one positioned at toe  off and the other at heel strike)  2 sets x 15 reps each Leg- Increased difficulty   Sit to stand  activity x 10 reps without UE support  Education provided throughout session via VC/TC and demonstration to facilitate movement at target joints and correct muscle activation for all testing and exercises performed.   Patient was well motivated for today's session and able to progress to more standing activities including gait sequencing and proprioception. He was fatigued at end of session but did not report any worsening of pain. Patient will benefit from skilled PT services to address these deficits, improve balance, and decrease risk for future falls                  PT Education - 04/23/21 1308     Education Details gait sequencing    Person(s) Educated Patient    Methods Demonstration;Tactile cues;Explanation;Verbal cues    Comprehension Verbalized understanding;Returned demonstration;Verbal cues required;Tactile cues required;Need further instruction              PT Short Term Goals - 04/08/21 0911       PT SHORT TERM GOAL #1   Title Pt will be independent with INITIAL  HEP in order to improve strength and balance in order to decrease fall risk and improve function at home and work.    Baseline 04/07/2021= No formal HEP in place    Time 6    Period Weeks    Status New    Target Date 05/19/21      PT  SHORT TERM GOAL #2   Title Pt will decrease 5TSTS by at least 3 seconds in order to demonstrate clinically significant improvement in LE strength.    Baseline 04/07/2021= 25 sec    Time 6    Period Weeks    Status New    Target Date 06/30/21               PT Long Term Goals - 04/08/21 0905       PT LONG TERM GOAL #1   Title Pt will be independent with FINAL HEP in order to improve strength and balance in order to decrease fall risk and improve function at home and work.    Baseline 04/07/2021= No formal HEP in place.    Time 12    Period Weeks    Status New    Target Date 06/30/21      PT LONG TERM GOAL #2   Title Pt will improve FOTO to target score of 45% to display perceived improvements in ability to complete ADL's.    Baseline 2/7/22023= 41%    Time 12    Period Weeks    Status New    Target Date 06/30/21      PT LONG TERM GOAL #3   Title Pt will decrease 5TSTS by at least 5 seconds in order to demonstrate clinically significant improvement in LE strength.    Baseline 04/07/2021= 25 sec    Time 12    Period Weeks    Status New    Target Date 06/30/21      PT LONG TERM GOAL #4   Title Pt will decrease TUG to below 17 seconds/decrease in order to demonstrate decreased fall risk.    Baseline 04/07/2021= 21 sec with SPC    Time 12    Period Weeks    Status New    Target Date 06/30/21      PT LONG TERM GOAL #5  Title Pt will increase 10MWT by at least 0.15 m/s in order to demonstrate clinically significant improvement in community ambulation.    Baseline 04/07/2021= 0.56 m/s using SPC    Time 12    Period Weeks    Status New    Target Date 06/30/21                    Patient will benefit from skilled therapeutic intervention in order to improve the following deficits and impairments:     Visit Diagnosis: Difficulty in walking, not elsewhere classified  Abnormality of gait and mobility  Muscle weakness (generalized)  Unsteadiness on  feet     Problem List Patient Active Problem List   Diagnosis Date Noted   Class 2 obesity due to excess calories with body mass index (BMI) of 36.0 to 36.9 in adult 04/11/2020   Swelling of limb 03/28/2020   Lymphedema 03/28/2020   Trigeminal neuralgia 12/25/2019   Lower limb ulcer, calf, left, limited to breakdown of skin ([place]) 12/25/2019   OSA (obstructive sleep apnea) 09/98/3382   Acute systolic CHF (congestive heart failure) ([place]) 12/12/2018   Bruising 07/10/2018   Diet-controlled type 2 diabetes mellitus ([place]) 03/24/2018   Microalbuminuria 03/24/2018   Dizziness 11/17/2017   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   SOBOE (shortness of breath on exertion) 02/18/2017   Chronic midline low back pain without sciatica 12/29/2016   Periodic limb movement disorder 12/29/2016   Benign essential tremor 06/22/2016   Pure hypercholesterolemia 06/20/2015   Erectile dysfunction due to arterial insufficiency 06/16/2015   Unstable angina ([place]) 01/22/2015   Abdominal aortic 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 rupture 12/31/2014   Benign prostatic hypertrophy without urinary obstruction 07/31/2014   Enlarged prostate 07/31/2014   Benign essential HTN 06/11/2014   Carotid artery narrowing 02/08/2014   Carotid artery obstruction 02/08/2014   Bilateral carotid artery stenosis 02/08/2014   Chest pain 08/20/2013   Bilateral cataracts 05/16/2013   Cataract 05/16/2013   Clinical depression 12/12/2012   Diabetes mellitus, type 2 ([place]) 12/12/2012   Combined fat and carbohydrate induced hyperlipemia 12/12/2012   Major depressive disorder, single episode, unspecified 12/12/2012   Acid reflux 11/09/2012   Presence of stent in coronary artery 11/09/2012   BP (high blood pressure) 11/09/2012   Healed myocardial infarct 11/09/2012   Gastro-esophageal reflux disease without esophagitis 11/09/2012   History of cardiovascular surgery 11/09/2012   Fothergill's neuralgia 08/07/2012   Swelling of testicle  04/06/2012   Disorder of male genital organ 04/06/2012   Swelling of the testicles 04/06/2012   Arteriosclerosis of coronary artery 11/16/2011   CAD in native artery 11/16/2011   Fatigue 06/04/2011   Avitaminosis D 11/30/2010    Lewis Moccasin, PT 04/23/2021, 1:09 PM  [place] MAIN [facility] SERVICES [place], [place], [address] Phone: [phone]   Fax:  [phone]  Name: [name] MRN: [id number] Date of Birth: June 19, 1945

## 2021-04-27 ENCOUNTER — Ambulatory Visit: Payer: Medicare HMO

## 2021-04-27 ENCOUNTER — Other Ambulatory Visit: Payer: Self-pay

## 2021-04-27 DIAGNOSIS — R262 Difficulty in walking, not elsewhere classified: Secondary | ICD-10-CM

## 2021-04-27 DIAGNOSIS — R2681 Unsteadiness on feet: Secondary | ICD-10-CM

## 2021-04-27 DIAGNOSIS — M6281 Muscle weakness (generalized): Secondary | ICD-10-CM

## 2021-04-27 DIAGNOSIS — R269 Unspecified abnormalities of gait and mobility: Secondary | ICD-10-CM

## 2021-04-27 NOTE — Therapy (Signed)
[place] MAIN [facility] [address] [place], [place], [address] Phone: [phone]   Fax:  [phone]  Physical Therapy Treatment  Patient Details  Name: [name] MRN: [id number] Date of Birth: [birth year] Referring Provider (PT): Dr. Gurney Maxin   Encounter Date: 04/27/2021   PT End of Session - 04/27/21 0855     Visit Number 7    Number of Visits 25    Date for PT Re-Evaluation 06/30/21    Authorization Time Period Initial Certification= 05/30/9377- 06/30/2021    PT Start Time 0846    PT Stop Time 0928    PT Time Calculation (min) 42 min    Equipment Utilized During Treatment Gait belt    Activity Tolerance Patient tolerated treatment well    Behavior During Therapy [name] for tasks assessed/performed             Past Medical History:  Diagnosis Date   Anxiety    Arteriosclerosis of coronary artery 11/16/2011   Overview:  Stent 10/2011 stent rca 2015 with collaterals to lad which is chronically occluded    Benign enlargement of prostate    Benign essential HTN 06/11/2014   Benign prostatic hypertrophy without urinary obstruction 07/31/2014   Bilateral cataracts 05/16/2013   Overview:  Dr. [facility]     Bone spur of foot    Left   BP (high blood pressure) 11/09/2012   Cancer ([place])    skin (forehead) and bladder   Carotid artery narrowing 02/08/2014   Depression    Detrusor hypertrophy    Diabetes ([place])    Diabetes mellitus, type 2 ([place]) 12/12/2012   Diverticulosis    Dyspnea    Esophageal reflux    Esophageal reflux    Fothergill's neuralgia 08/07/2012   Overview:  [name]    Gastritis    GERD (gastroesophageal reflux disease)    Headache    cluster headaches   Healed myocardial infarct 11/09/2012   Hearing loss in left ear    Heart disease    Hematuria    Hemorrhoids    History of hiatal hernia 12/14/2017   small    Hypercholesteremia    Lesion of bladder    Myocardial infarct (HCC)     Presence of stent in coronary artery 11/09/2012   Rectal bleeding    Trigeminal neuralgia    Trigeminal neuralgia    Valvular heart disease    Vitamin D deficiency     Past Surgical History:  Procedure Laterality Date   APPENDECTOMY     BOTOX INJECTION N/A 12/21/2017   Procedure: Bladder BOTOX INJECTION;  Surgeon: [name], MD;  Location: [facility] ORS;  Service: Urology;  Laterality: N/A;   BOTOX INJECTION N/A 09/11/2018   Procedure: Bladder BOTOX INJECTION;  Surgeon: [name], MD;  Location: [facility] ORS;  Service: Urology;  Laterality: N/A;   CARDIAC CATHETERIZATION     CARDIAC CATHETERIZATION N/A 01/22/2015   Procedure: Left Heart Cath;  Surgeon: Corey Skains, MD;  Location: [place] [facility];  Service: Cardiovascular;  Laterality: N/A;   CARDIAC CATHETERIZATION N/A 01/22/2015   Procedure: Coronary Stent Intervention;  Surgeon: Isaias Cowman, MD;  Location: [facility];  Service: Cardiovascular;  Laterality: N/A;   CATARACT EXTRACTION, BILATERAL     COLONOSCOPY WITH PROPOFOL N/A 12/08/2015   Procedure: COLONOSCOPY WITH PROPOFOL;  Surgeon: Lollie Sails, MD;  Location: [facility] ENDOSCOPY;  Service: Endoscopy;  Laterality: N/A;   COLONOSCOPY WITH PROPOFOL N/A  12/09/2015   Procedure: COLONOSCOPY WITH PROPOFOL;  Surgeon: Lollie Sails, MD;  Location: [facility] ENDOSCOPY;  Service: Endoscopy;  Laterality: N/A;   CORONARY ANGIOPLASTY     5 stents   CORONARY STENT PLACEMENT  2015   x5   CYSTOSCOPY N/A 09/11/2018   Procedure: CYSTOSCOPY;  Surgeon: [name], MD;  Location: [facility] ORS;  Service: Urology;  Laterality: N/A;   CYSTOSCOPY WITH BIOPSY N/A 12/21/2017   Procedure: CYSTOSCOPY WITH BIOPSY;  Surgeon: [name], MD;  Location: [facility] ORS;  Service: Urology;  Laterality: N/A;   ESOPHAGOGASTRODUODENOSCOPY (EGD) WITH PROPOFOL N/A 12/08/2015   Procedure: ESOPHAGOGASTRODUODENOSCOPY (EGD) WITH PROPOFOL;  Surgeon: Lollie Sails, MD;  Location: [facility]  ENDOSCOPY;  Service: Endoscopy;  Laterality: N/A;   ESOPHAGOGASTRODUODENOSCOPY (EGD) WITH PROPOFOL N/A 12/13/2017   Procedure: ESOPHAGOGASTRODUODENOSCOPY (EGD) WITH PROPOFOL;  Surgeon: Lollie Sails, MD;  Location: [facility] ENDOSCOPY;  Service: Endoscopy;  Laterality: N/A;   EYE SURGERY     HERNIA REPAIR     kidney tumor remove     TRANSURETHRAL RESECTION OF BLADDER TUMOR WITH GYRUS (TURBT-GYRUS)  04/5007   UMBILICAL HERNIA REPAIR     urethral meatotomy      There were no vitals filed for this visit.   Subjective Assessment - 04/27/21 0852     Subjective Patient reports moving slow for early morning- States he took pain pill before he came and reporting a 4/10.    Patient is accompained by: Family member    Pertinent History Patient is a 76 year old male with referral for Physical Therapy with diagnosis of Parkinsons disease and history of falling. He reports he diagnosed last year with Parkinsons and has improved his tremors with use of medication but reports worsening shuffling and difficulty with mobility. Patient has past medical history signifiant for Parkinsons; Arthritis, bladder cancer, Trigemic Neuralgia. He lives with wife in 1 level home and current using cane with reported recent fall.    Limitations Walking;Lifting;Standing;House hold activities    How long can you sit comfortably? No limits    How long can you stand comfortably? 15 min- limited due to back pain and foot numbess    How long can you walk comfortably? about 80 feet    Patient Stated Goals Improve my balance, be able to get up off the floor so I can maybe work in my garage again.    Currently in Pain? Yes    Pain Score 4     Pain Location Back    Pain Orientation Lower    Pain Descriptors / Indicators Aching;Sore    Pain Type Chronic pain    Pain Onset More than a month ago    Pain Frequency Intermittent    Aggravating Factors  prolonged walking, standing    Pain Relieving Factors Rest               INTERVENTIONS:  Therapeutic Exercises:   Nustep- L0 for 5 min LE only   High knee March- GTB as reference for height 2 sets x 12 reps.  Forward steps- in // bars - No cues- down and back - PT counted steps- instructed patient that he required 21 steps. On second trial- informed him to try counting his steps and he performed the same distance in 16 sec  Trunk Flex/ext - x 12 reps- No report of any increased LBP  Trunk rotation with Arms out wide - then clap to left then twist to right and clap x 12 reps.  Retro gait in // bars (VC not to shuffle and try to step back reciprocal)  x length of bars x 4.   Standing hip ext BLE x 12 reps. VC for correct technique  Side step up/over orange hurdle x 15 reps. BUE support and 2-3 episodes of patient kicking over hurdle due to weakness.   Education provided throughout session via VC/TC and demonstration to facilitate movement at target joints and correct muscle activation for all testing and exercises performed.                         PT Education - 04/27/21 0854     Education Details Exercie technique    Person(s) Educated Patient    Methods Explanation;Demonstration;Tactile cues;Verbal cues    Comprehension Verbalized understanding;Returned demonstration;Verbal cues required;Tactile cues required;Need further instruction              PT Short Term Goals - 04/08/21 0911       PT SHORT TERM GOAL #1   Title Pt will be independent with INITIAL  HEP in order to improve strength and balance in order to decrease fall risk and improve function at home and work.    Baseline 04/07/2021= No formal HEP in place    Time 6    Period Weeks    Status New    Target Date 05/19/21      PT SHORT TERM GOAL #2   Title Pt will decrease 5TSTS by at least 3 seconds in order to demonstrate clinically significant improvement in LE strength.    Baseline 04/07/2021= 25 sec    Time 6    Period Weeks    Status New    Target  Date 06/30/21               PT Long Term Goals - 04/08/21 0905       PT LONG TERM GOAL #1   Title Pt will be independent with FINAL HEP in order to improve strength and balance in order to decrease fall risk and improve function at home and work.    Baseline 04/07/2021= No formal HEP in place.    Time 12    Period Weeks    Status New    Target Date 06/30/21      PT LONG TERM GOAL #2   Title Pt will improve FOTO to target score of 45% to display perceived improvements in ability to complete ADL's.    Baseline 2/7/22023= 41%    Time 12    Period Weeks    Status New    Target Date 06/30/21      PT LONG TERM GOAL #3   Title Pt will decrease 5TSTS by at least 5 seconds in order to demonstrate clinically significant improvement in LE strength.    Baseline 04/07/2021= 25 sec    Time 12    Period Weeks    Status New    Target Date 06/30/21      PT LONG TERM GOAL #4   Title Pt will decrease TUG to below 17 seconds/decrease in order to demonstrate decreased fall risk.    Baseline 04/07/2021= 21 sec with SPC    Time 12    Period Weeks    Status New    Target Date 06/30/21      PT LONG TERM GOAL #5   Title Pt will increase 10MWT by at least 0.15 m/s in order to demonstrate clinically significant improvement in community  ambulation.    Baseline 04/07/2021= 0.56 m/s using SPC    Time 12    Period Weeks    Status New    Target Date 06/30/21                   Plan - 04/27/21 0855     Clinical Impression Statement Patient arrived with wife today in good spirits - ready to participate. He responded well to cues to increase his step length and able to improve with trunk twist and retro gait. He was fatigued at end of session yet participated well with brief rest breaks and did improve with gait sequencing and trunk mobility today. Patient will benefit from skilled PT services to address these deficits, improve balance, and decrease risk for future falls    Personal Factors and  Comorbidities Comorbidity 3+    Comorbidities arthritis, bladder cancer, Trigeminal Neuralgia    Examination-Activity Limitations Caring for Others;Carry;Continence;Lift;Squat;Stairs;Stand    Examination-Participation Restrictions Community Activity;Yard Work    [employer] Evolving/Moderate complexity    Rehab Potential Good    PT Frequency 2x / week    PT Duration 12 weeks    PT Treatment/Interventions ADLs/Self Care Home Management;Cryotherapy;Canalith Repostioning;Electrical Stimulation;Moist Heat;DME Instruction;Gait training;Stair training;Functional mobility training;Therapeutic activities;Therapeutic exercise;Balance training;Neuromuscular re-education;Patient/family education;Manual techniques;Passive range of motion;Dry needling;Vestibular    PT Next Visit Plan LE strength, improve step length with ambulation, muscle tissue lengthening    PT Home Exercise Plan Access Code: [email or web address]             Patient will benefit from skilled therapeutic intervention in order to improve the following deficits and impairments:  Abnormal gait, Decreased activity tolerance, Decreased balance, Decreased coordination, Decreased endurance, Decreased mobility, Decreased range of motion, Decreased strength, Difficulty walking, Hypomobility, Impaired flexibility  Visit Diagnosis: Abnormality of gait and mobility  Difficulty in walking, not elsewhere classified  Muscle weakness (generalized)  Unsteadiness on feet     Problem List Patient Active Problem List   Diagnosis Date Noted   Class 2 obesity due to excess calories with body mass index (BMI) of 36.0 to 36.9 in adult 04/11/2020   Swelling of limb 03/28/2020   Lymphedema 03/28/2020   Trigeminal neuralgia 12/25/2019   Lower limb ulcer, calf, left, limited to breakdown of skin ([place]) 12/25/2019   OSA (obstructive sleep apnea) 08/65/7846   Acute systolic CHF (congestive heart failure) ([place]) 12/12/2018   Bruising  07/10/2018   Diet-controlled type 2 diabetes mellitus ([place]) 03/24/2018   Microalbuminuria 03/24/2018   Dizziness 11/17/2017   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   SOBOE (shortness of breath on exertion) 02/18/2017   Chronic midline low back pain without sciatica 12/29/2016   Periodic limb movement disorder 12/29/2016   Benign essential tremor 06/22/2016   Pure hypercholesterolemia 06/20/2015   Erectile dysfunction due to arterial insufficiency 06/16/2015   Unstable angina ([place]) 01/22/2015   Abdominal aortic 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 rupture 12/31/2014   Benign prostatic hypertrophy without urinary obstruction 07/31/2014   Enlarged prostate 07/31/2014   Benign essential HTN 06/11/2014   Carotid artery narrowing 02/08/2014   Carotid artery obstruction 02/08/2014   Bilateral carotid artery stenosis 02/08/2014   Chest pain 08/20/2013   Bilateral cataracts 05/16/2013   Cataract 05/16/2013   Clinical depression 12/12/2012   Diabetes mellitus, type 2 ([place]) 12/12/2012   Combined fat and carbohydrate induced hyperlipemia 12/12/2012   Major depressive disorder, single episode, unspecified 12/12/2012   Acid reflux 11/09/2012   Presence of stent in coronary artery 11/09/2012  BP (high blood pressure) 11/09/2012   Healed myocardial infarct 11/09/2012   Gastro-esophageal reflux disease without esophagitis 11/09/2012   History of cardiovascular surgery 11/09/2012   Fothergill's neuralgia 08/07/2012   Swelling of testicle 04/06/2012   Disorder of male genital organ 04/06/2012   Swelling of the testicles 04/06/2012   Arteriosclerosis of coronary artery 11/16/2011   CAD in native artery 11/16/2011   Fatigue 06/04/2011   Avitaminosis D 11/30/2010    Lewis Moccasin, PT 04/27/2021, 1:34 PM  [place] MAIN [facility] SERVICES [address] [place], [place], [address] Phone: [phone]   Fax:  [phone]  Name: KAHLE MCQUEEN MRN:  [id number] Date of Birth: [birth year]

## 2021-04-29 ENCOUNTER — Ambulatory Visit: Payer: Medicare HMO | Attending: Neurology

## 2021-04-29 ENCOUNTER — Other Ambulatory Visit: Payer: Self-pay

## 2021-04-29 DIAGNOSIS — G8929 Other chronic pain: Secondary | ICD-10-CM | POA: Insufficient documentation

## 2021-04-29 DIAGNOSIS — R278 Other lack of coordination: Secondary | ICD-10-CM | POA: Insufficient documentation

## 2021-04-29 DIAGNOSIS — R262 Difficulty in walking, not elsewhere classified: Secondary | ICD-10-CM | POA: Insufficient documentation

## 2021-04-29 DIAGNOSIS — R269 Unspecified abnormalities of gait and mobility: Secondary | ICD-10-CM | POA: Insufficient documentation

## 2021-04-29 DIAGNOSIS — R2689 Other abnormalities of gait and mobility: Secondary | ICD-10-CM | POA: Diagnosis present

## 2021-04-29 DIAGNOSIS — M6281 Muscle weakness (generalized): Secondary | ICD-10-CM | POA: Diagnosis present

## 2021-04-29 DIAGNOSIS — M545 Low back pain, unspecified: Secondary | ICD-10-CM | POA: Insufficient documentation

## 2021-04-29 DIAGNOSIS — R2681 Unsteadiness on feet: Secondary | ICD-10-CM | POA: Insufficient documentation

## 2021-04-29 NOTE — Therapy (Signed)
[place] MAIN [facility] SERVICES [address] [place], [place], [address] Phone: [phone]   Fax:  [phone]  Physical Therapy Treatment  Patient Details  Name: [name] MRN: [id number] Date of Birth: 1946/02/15 Referring Provider (PT): Dr. Gurney [name]   Encounter Date: 04/29/2021   PT End of Session - 04/29/21 1608     Visit Number 8    Number of Visits 25    Date for PT Re-Evaluation 06/30/21    Authorization Time Period Initial Certification= 06/03/8097- 06/30/2021    PT Start Time 1559    PT Stop Time 1645    PT Time Calculation (min) 46 min    Equipment Utilized During Treatment Gait belt    Activity Tolerance Patient tolerated treatment well    Behavior During Therapy [facility] for tasks assessed/performed             Past Medical History:  Diagnosis Date   Anxiety    Arteriosclerosis of coronary artery 11/16/2011   Overview:  Stent 10/2011 stent rca 2015 with collaterals to lad which is chronically occluded    Benign enlargement of prostate    Benign essential HTN 06/11/2014   Benign prostatic hypertrophy without urinary obstruction 07/31/2014   Bilateral cataracts 05/16/2013   Overview:  Dr. [facility]     Bone spur of foot    Left   BP (high blood pressure) 11/09/2012   Cancer ([place])    skin (forehead) and bladder   Carotid artery narrowing 02/08/2014   Depression    Detrusor hypertrophy    Diabetes ([place])    Diabetes mellitus, type 2 ([place]) 12/12/2012   Diverticulosis    Dyspnea    Esophageal reflux    Esophageal reflux    Fothergill's neuralgia 08/07/2012   Overview:  [facility]    Gastritis    GERD (gastroesophageal reflux disease)    Headache    cluster headaches   Healed myocardial infarct 11/09/2012   Hearing loss in left ear    Heart disease    Hematuria    Hemorrhoids    History of hiatal hernia 12/14/2017   small    Hypercholesteremia    Lesion of bladder    Myocardial infarct (HCC)     Presence of stent in coronary artery 11/09/2012   Rectal bleeding    Trigeminal neuralgia    Trigeminal neuralgia    Valvular heart disease    Vitamin D deficiency     Past Surgical History:  Procedure Laterality Date   APPENDECTOMY     BOTOX INJECTION N/A 12/21/2017   Procedure: Bladder BOTOX INJECTION;  Surgeon: [name], MD;  Location: [facility] ORS;  Service: Urology;  Laterality: N/A;   BOTOX INJECTION N/A 09/11/2018   Procedure: Bladder BOTOX INJECTION;  Surgeon: [name], MD;  Location: [facility] ORS;  Service: Urology;  Laterality: N/A;   CARDIAC CATHETERIZATION     CARDIAC CATHETERIZATION N/A 01/22/2015   Procedure: Left Heart Cath;  Surgeon: Corey Skains, MD;  Location: [place] CV LAB;  Service: Cardiovascular;  Laterality: N/A;   CARDIAC CATHETERIZATION N/A 01/22/2015   Procedure: Coronary Stent Intervention;  Surgeon: Isaias Cowman, MD;  Location: [place] CV LAB;  Service: Cardiovascular;  Laterality: N/A;   CATARACT EXTRACTION, BILATERAL     COLONOSCOPY WITH PROPOFOL N/A 12/08/2015   Procedure: COLONOSCOPY WITH PROPOFOL;  Surgeon: Lollie Sails, MD;  Location: [facility] ENDOSCOPY;  Service: Endoscopy;  Laterality: N/A;   COLONOSCOPY WITH PROPOFOL N/A  12/09/2015   Procedure: COLONOSCOPY WITH PROPOFOL;  Surgeon: Lollie Sails, MD;  Location: [facility] ENDOSCOPY;  Service: Endoscopy;  Laterality: N/A;   CORONARY ANGIOPLASTY     5 stents   CORONARY STENT PLACEMENT  2015   x5   CYSTOSCOPY N/A 09/11/2018   Procedure: CYSTOSCOPY;  Surgeon: [name], MD;  Location: [facility] ORS;  Service: Urology;  Laterality: N/A;   CYSTOSCOPY WITH BIOPSY N/A 12/21/2017   Procedure: CYSTOSCOPY WITH BIOPSY;  Surgeon: [name], MD;  Location: [facility] ORS;  Service: Urology;  Laterality: N/A;   ESOPHAGOGASTRODUODENOSCOPY (EGD) WITH PROPOFOL N/A 12/08/2015   Procedure: ESOPHAGOGASTRODUODENOSCOPY (EGD) WITH PROPOFOL;  Surgeon: Lollie Sails, MD;  Location: [facility]  ENDOSCOPY;  Service: Endoscopy;  Laterality: N/A;   ESOPHAGOGASTRODUODENOSCOPY (EGD) WITH PROPOFOL N/A 12/13/2017   Procedure: ESOPHAGOGASTRODUODENOSCOPY (EGD) WITH PROPOFOL;  Surgeon: Lollie Sails, MD;  Location: [facility] ENDOSCOPY;  Service: Endoscopy;  Laterality: N/A;   EYE SURGERY     HERNIA REPAIR     kidney tumor remove     TRANSURETHRAL RESECTION OF BLADDER TUMOR WITH GYRUS (TURBT-GYRUS)  48/5462   UMBILICAL HERNIA REPAIR     urethral meatotomy      There were no vitals filed for this visit.   Subjective Assessment - 04/29/21 1603     Subjective Patient reports back is bothering him and more tired in the afternoon.    Patient is accompained by: Family member    Pertinent History Patient is a 76 year old male with referral for Physical Therapy with diagnosis of Parkinsons disease and history of falling. He reports he diagnosed last year with Parkinsons and has improved his tremors with use of medication but reports worsening shuffling and difficulty with mobility. Patient has past medical history signifiant for Parkinsons; Arthritis, bladder cancer, Trigemic Neuralgia. He lives with wife in 1 level home and current using cane with reported recent fall.    Limitations Walking;Lifting;Standing;House hold activities    How long can you sit comfortably? No limits    How long can you stand comfortably? 15 min- limited due to back pain and foot numbess    How long can you walk comfortably? about 80 feet    Patient Stated Goals Improve my balance, be able to get up off the floor so I can maybe work in my garage again.    Currently in Pain? Yes    Pain Score 5     Pain Location Back    Pain Orientation Posterior    Pain Descriptors / Indicators Aching;Sore    Pain Type Chronic pain    Pain Onset More than a month ago    Pain Frequency Intermittent    Aggravating Factors  prolonged walking, standing    Pain Relieving Factors Rest    Effect of Pain on Daily Activities Difficulty with  walking.            INTERVENTIONS:    Nustep L0 for 6 min - LE only to focus on ROM/  Ladder drills - Forward - 1 foot per square down and back in // bars- x 10  Ladder drills - Side step- 2 feet per square down and back in // bars x 10  Ladder drills- forward/retro- 1 foot per square down and back concentrating   Step tap without UE Support x 12 reps each LE  Step up with BUE Support x 12 reps each LE  Gait with trekking poles- walking in clinic focusing on BUE swing  Forward/retro gait  in // bars- down and back x 5 focusing on increasing step length.   Sit to stand without UE support x 10 reps  Education provided throughout session via VC/TC and demonstration to facilitate movement at target joints and correct muscle activation for all testing and exercises performed.                           PT Education - 04/30/21 1531     Education Details exercise technique    Person(s) Educated Patient    Methods Explanation;Demonstration;Tactile cues;Verbal cues    Comprehension Verbalized understanding;Returned demonstration;Verbal cues required;Tactile cues required;Need further instruction              PT Short Term Goals - 04/08/21 0911       PT SHORT TERM GOAL #1   Title Pt will be independent with INITIAL  HEP in order to improve strength and balance in order to decrease fall risk and improve function at home and work.    Baseline 04/07/2021= No formal HEP in place    Time 6    Period Weeks    Status New    Target Date 05/19/21      PT SHORT TERM GOAL #2   Title Pt will decrease 5TSTS by at least 3 seconds in order to demonstrate clinically significant improvement in LE strength.    Baseline 04/07/2021= 25 sec    Time 6    Period Weeks    Status New    Target Date 06/30/21               PT Long Term Goals - 04/08/21 0905       PT LONG TERM GOAL #1   Title Pt will be independent with FINAL HEP in order to improve strength and  balance in order to decrease fall risk and improve function at home and work.    Baseline 04/07/2021= No formal HEP in place.    Time 12    Period Weeks    Status New    Target Date 06/30/21      PT LONG TERM GOAL #2   Title Pt will improve FOTO to target score of 45% to display perceived improvements in ability to complete ADL's.    Baseline 2/7/22023= 41%    Time 12    Period Weeks    Status New    Target Date 06/30/21      PT LONG TERM GOAL #3   Title Pt will decrease 5TSTS by at least 5 seconds in order to demonstrate clinically significant improvement in LE strength.    Baseline 04/07/2021= 25 sec    Time 12    Period Weeks    Status New    Target Date 06/30/21      PT LONG TERM GOAL #4   Title Pt will decrease TUG to below 17 seconds/decrease in order to demonstrate decreased fall risk.    Baseline 04/07/2021= 21 sec with SPC    Time 12    Period Weeks    Status New    Target Date 06/30/21      PT LONG TERM GOAL #5   Title Pt will increase 10MWT by at least 0.15 m/s in order to demonstrate clinically significant improvement in community ambulation.    Baseline 04/07/2021= 0.56 m/s using SPC    Time 12    Period Weeks    Status New    Target Date 06/30/21  Plan - 04/29/21 1609     Clinical Impression Statement Patient presents with good motivation today and responsive to all VC for safe techniques with balance activities. He improved his walking with assist of PT using trekking poles to traverse through clinic. He was challenged with retro steps and reverts quickly back to shuffle without VC. Patient will benefit from skilled PT services to address these deficits, improve balance, and decrease risk for future falls    Personal Factors and Comorbidities Comorbidity 3+    Comorbidities arthritis, bladder cancer, Trigeminal Neuralgia    Examination-Activity Limitations Caring for Others;Carry;Continence;Lift;Squat;Stairs;Stand     Examination-Participation Restrictions Community Activity;Yard Work    [employer] Evolving/Moderate complexity    Rehab Potential Good    PT Frequency 2x / week    PT Duration 12 weeks    PT Treatment/Interventions ADLs/Self Care Home Management;Cryotherapy;Canalith Repostioning;Electrical Stimulation;Moist Heat;DME Instruction;Gait training;Stair training;Functional mobility training;Therapeutic activities;Therapeutic exercise;Balance training;Neuromuscular re-education;Patient/family education;Manual techniques;Passive range of motion;Dry needling;Vestibular    PT Next Visit Plan LE strength, improve step length with ambulation, muscle tissue lengthening    PT Home Exercise Plan Access Code: [email or web address]             Patient will benefit from skilled therapeutic intervention in order to improve the following deficits and impairments:  Abnormal gait, Decreased activity tolerance, Decreased balance, Decreased coordination, Decreased endurance, Decreased mobility, Decreased range of motion, Decreased strength, Difficulty walking, Hypomobility, Impaired flexibility  Visit Diagnosis: Abnormality of gait and mobility  Difficulty in walking, not elsewhere classified  Muscle weakness (generalized)  Unsteadiness on feet     Problem List Patient Active Problem List   Diagnosis Date Noted   Class 2 obesity due to excess calories with body mass index (BMI) of 36.0 to 36.9 in adult 04/11/2020   Swelling of limb 03/28/2020   Lymphedema 03/28/2020   Trigeminal neuralgia 12/25/2019   Lower limb ulcer, calf, left, limited to breakdown of skin ([place]) 12/25/2019   OSA (obstructive sleep apnea) 73/41/9379   Acute systolic CHF (congestive heart failure) ([place]) 12/12/2018   Bruising 07/10/2018   Diet-controlled type 2 diabetes mellitus ([place]) 03/24/2018   Microalbuminuria 03/24/2018   Dizziness 11/17/2017   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   SOBOE (shortness of  breath on exertion) 02/18/2017   Chronic midline low back pain without sciatica 12/29/2016   Periodic limb movement disorder 12/29/2016   Benign essential tremor 06/22/2016   Pure hypercholesterolemia 06/20/2015   Erectile dysfunction due to arterial insufficiency 06/16/2015   Unstable angina ([place]) 01/22/2015   Abdominal aortic 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 rupture 12/31/2014   Benign prostatic hypertrophy without urinary obstruction 07/31/2014   Enlarged prostate 07/31/2014   Benign essential HTN 06/11/2014   Carotid artery narrowing 02/08/2014   Carotid artery obstruction 02/08/2014   Bilateral carotid artery stenosis 02/08/2014   Chest pain 08/20/2013   Bilateral cataracts 05/16/2013   Cataract 05/16/2013   Clinical depression 12/12/2012   Diabetes mellitus, type 2 ([place]) 12/12/2012   Combined fat and carbohydrate induced hyperlipemia 12/12/2012   Major depressive disorder, single episode, unspecified 12/12/2012   Acid reflux 11/09/2012   Presence of stent in coronary artery 11/09/2012   BP (high blood pressure) 11/09/2012   Healed myocardial infarct 11/09/2012   Gastro-esophageal reflux disease without esophagitis 11/09/2012   History of cardiovascular surgery 11/09/2012   Fothergill's neuralgia 08/07/2012   Swelling of testicle 04/06/2012   Disorder of male genital organ 04/06/2012   Swelling of the testicles 04/06/2012   Arteriosclerosis of coronary artery  11/16/2011   CAD in native artery 11/16/2011   Fatigue 06/04/2011   Avitaminosis D 11/30/2010    Lewis Moccasin, PT 04/30/2021, 5:16 PM  [place] MAIN [facility] SERVICES [address] [place], [place], [address] Phone: [phone]   Fax:  [phone]  Name: DAILYN KEMPNER MRN: [id number] Date of Birth: 17-May-1945

## 2021-05-06 ENCOUNTER — Ambulatory Visit: Payer: Medicare HMO

## 2021-05-06 ENCOUNTER — Other Ambulatory Visit: Payer: Self-pay

## 2021-05-06 DIAGNOSIS — R269 Unspecified abnormalities of gait and mobility: Secondary | ICD-10-CM | POA: Diagnosis not present

## 2021-05-06 DIAGNOSIS — R262 Difficulty in walking, not elsewhere classified: Secondary | ICD-10-CM

## 2021-05-06 DIAGNOSIS — R2681 Unsteadiness on feet: Secondary | ICD-10-CM

## 2021-05-06 DIAGNOSIS — M6281 Muscle weakness (generalized): Secondary | ICD-10-CM

## 2021-05-06 NOTE — Therapy (Signed)
[place] MAIN [facility] SERVICES [address] [place], [place], [address] Phone: [phone]   Fax:  [phone]  Physical Therapy Treatment  Patient Details  Name: [name] MRN: [id number] Date of Birth: [birth year] Referring Provider (PT): Dr. Gurney Maxin   Encounter Date: 05/06/2021   PT End of Session - 05/06/21 1614     Visit Number 9    Number of Visits 25    Date for PT Re-Evaluation 06/30/21    Authorization Time Period Initial Certification= 07/31/8364- 06/30/2021    PT Start Time 2947    PT Stop Time 6546    PT Time Calculation (min) 43 min    Equipment Utilized During Treatment Gait belt    Activity Tolerance Patient tolerated treatment well    Behavior During Therapy [name] for tasks assessed/performed             Past Medical History:  Diagnosis Date   Anxiety    Arteriosclerosis of coronary artery 11/16/2011   Overview:  Stent 10/2011 stent rca 2015 with collaterals to lad which is chronically occluded    Benign enlargement of prostate    Benign essential HTN 06/11/2014   Benign prostatic hypertrophy without urinary obstruction 07/31/2014   Bilateral cataracts 05/16/2013   Overview:  [name]     Bone spur of foot    Left   BP (high blood pressure) 11/09/2012   Cancer ([place])    skin (forehead) and bladder   Carotid artery narrowing 02/08/2014   Depression    Detrusor hypertrophy    Diabetes ([place])    Diabetes mellitus, type 2 ([place]) 12/12/2012   Diverticulosis    Dyspnea    Esophageal reflux    Esophageal reflux    Fothergill's neuralgia 08/07/2012   Overview:  [facility]    Gastritis    GERD (gastroesophageal reflux disease)    Headache    cluster headaches   Healed myocardial infarct 11/09/2012   Hearing loss in left ear    Heart disease    Hematuria    Hemorrhoids    History of hiatal hernia 12/14/2017   small    Hypercholesteremia    Lesion of bladder    Myocardial infarct (HCC)     Presence of stent in coronary artery 11/09/2012   Rectal bleeding    Trigeminal neuralgia    Trigeminal neuralgia    Valvular heart disease    Vitamin D deficiency     Past Surgical History:  Procedure Laterality Date   APPENDECTOMY     BOTOX INJECTION N/A 12/21/2017   Procedure: Bladder BOTOX INJECTION;  Surgeon: [name];  Location: [facility] ORS;  Service: Urology;  Laterality: N/A;   BOTOX INJECTION N/A 09/11/2018   Procedure: Bladder BOTOX INJECTION;  Surgeon: [name];  Location: [facility] ORS;  Service: Urology;  Laterality: N/A;   CARDIAC CATHETERIZATION     CARDIAC CATHETERIZATION N/A 01/22/2015   Procedure: Left Heart Cath;  Surgeon: Corey Skains, [name];  Location: [place] CV LAB;  Service: Cardiovascular;  Laterality: N/A;   CARDIAC CATHETERIZATION N/A 01/22/2015   Procedure: Coronary Stent Intervention;  Surgeon: Isaias Cowman, [name];  Location: [place] CV LAB;  Service: Cardiovascular;  Laterality: N/A;   CATARACT EXTRACTION, BILATERAL     COLONOSCOPY WITH PROPOFOL N/A 12/08/2015   Procedure: COLONOSCOPY WITH PROPOFOL;  Surgeon: [name];  Location: [name] ENDOSCOPY;  Service: Endoscopy;  Laterality: N/A;   COLONOSCOPY WITH PROPOFOL N/A  12/09/2015   Procedure: COLONOSCOPY WITH PROPOFOL;  Surgeon: [name];  Location: [facility] ENDOSCOPY;  Service: Endoscopy;  Laterality: N/A;   CORONARY ANGIOPLASTY     5 stents   CORONARY STENT PLACEMENT  2015   x5   CYSTOSCOPY N/A 09/11/2018   Procedure: CYSTOSCOPY;  Surgeon: [name];  Location: [facility] ORS;  Service: Urology;  Laterality: N/A;   CYSTOSCOPY WITH BIOPSY N/A 12/21/2017   Procedure: CYSTOSCOPY WITH BIOPSY;  Surgeon: [name];  Location: [facility] ORS;  Service: Urology;  Laterality: N/A;   ESOPHAGOGASTRODUODENOSCOPY (EGD) WITH PROPOFOL N/A 12/08/2015   Procedure: ESOPHAGOGASTRODUODENOSCOPY (EGD) WITH PROPOFOL;  Surgeon: [name];  Location: [facility]  ENDOSCOPY;  Service: Endoscopy;  Laterality: N/A;   ESOPHAGOGASTRODUODENOSCOPY (EGD) WITH PROPOFOL N/A 12/13/2017   Procedure: ESOPHAGOGASTRODUODENOSCOPY (EGD) WITH PROPOFOL;  Surgeon: [name];  Location: [facility] ENDOSCOPY;  Service: Endoscopy;  Laterality: N/A;   [name] SURGERY     HERNIA REPAIR     kidney tumor remove     TRANSURETHRAL RESECTION OF BLADDER TUMOR WITH GYRUS (TURBT-GYRUS)  85/6314   UMBILICAL HERNIA REPAIR     urethral meatotomy      There were no vitals filed for this visit.   Subjective Assessment - 05/06/21 1610     Subjective Patient reports doing okay today - no new complaints- Reports about 4-5/10 low back pain.    Patient is accompained by: Family member    Pertinent History Patient is a 76 year old male with referral for Physical Therapy with diagnosis of Parkinsons disease and history of falling. He reports he diagnosed last year with Parkinsons and has improved his tremors with use of medication but reports worsening shuffling and difficulty with mobility. Patient has past medical history signifiant for Parkinsons; Arthritis, bladder cancer, Trigemic Neuralgia. He lives with wife in 1 level home and current using cane with reported recent fall.    Limitations Walking;Lifting;Standing;House hold activities    How long can you sit comfortably? No limits    How long can you stand comfortably? 15 min- limited due to back pain and foot numbess    How long can you walk comfortably? about 80 feet    Patient Stated Goals Improve my balance, be able to get up off the floor so I can maybe work in my garage again.    Currently in Pain? Yes    Pain Score 5     Pain Location Back    Pain Orientation Posterior;Lower    Pain Descriptors / Indicators Aching    Pain Type Chronic pain    Pain Onset More than a month ago    Aggravating Factors  Prolonged walking, Standing    Pain Relieving Factors Rest    Effect of Pain on Daily Activities Difficulty with walking             INTERVENTIONS:   Nustep- BUE/LE - level 0 for 7 min focusing on muscle endurance/ROM- total distance= 0.25 mi   Step tap onto 4 in step x 15 reps each Leg  Walking in // bars- with trunk twist/side step to left and right x length of bars and back x 5 Walking in // bars with arms wide and twisting trunk while clapping x length of bars and back x 5   Forward/retro steps in bars while counting amount of steps to complete walking length of bars (Patient improved from 11 steps down to 7 steps with multiple trials)   Gait -  walking in hallway with use of SPC with VC to increase step length. Patient exhibited good start with reciprocal step yet reverts to shuffling after about 50 feet using SPC.   Education provided throughout session via VC/TC and demonstration to facilitate movement at target joints and correct muscle activation for all testing and exercises performed.                          PT Education - 05/06/21 1613     Education Details Exercise technique    Person(s) Educated Patient    Methods Explanation;Demonstration;Tactile cues;Verbal cues    Comprehension Verbalized understanding;Returned demonstration;Need further instruction;Verbal cues required;Tactile cues required              PT Short Term Goals - 04/08/21 0911       PT SHORT TERM GOAL #1   Title Pt will be independent with INITIAL  HEP in order to improve strength and balance in order to decrease fall risk and improve function at home and work.    Baseline 04/07/2021= No formal HEP in place    Time 6    Period Weeks    Status New    Target Date 05/19/21      PT SHORT TERM GOAL #2   Title Pt will decrease 5TSTS by at least 3 seconds in order to demonstrate clinically significant improvement in LE strength.    Baseline 04/07/2021= 25 sec    Time 6    Period Weeks    Status New    Target Date 06/30/21               PT Long Term Goals - 04/08/21 0905       PT  LONG TERM GOAL #1   Title Pt will be independent with FINAL HEP in order to improve strength and balance in order to decrease fall risk and improve function at home and work.    Baseline 04/07/2021= No formal HEP in place.    Time 12    Period Weeks    Status New    Target Date 06/30/21      PT LONG TERM GOAL #2   Title Pt will improve FOTO to target score of 45% to display perceived improvements in ability to complete ADL's.    Baseline 2/7/22023= 41%    Time 12    Period Weeks    Status New    Target Date 06/30/21      PT LONG TERM GOAL #3   Title Pt will decrease 5TSTS by at least 5 seconds in order to demonstrate clinically significant improvement in LE strength.    Baseline 04/07/2021= 25 sec    Time 12    Period Weeks    Status New    Target Date 06/30/21      PT LONG TERM GOAL #4   Title Pt will decrease TUG to below 17 seconds/decrease in order to demonstrate decreased fall risk.    Baseline 04/07/2021= 21 sec with SPC    Time 12    Period Weeks    Status New    Target Date 06/30/21      PT LONG TERM GOAL #5   Title Pt will increase 10MWT by at least 0.15 m/s in order to demonstrate clinically significant improvement in community ambulation.    Baseline 04/07/2021= 0.56 m/s using SPC    Time 12    Period Weeks    Status New  Target Date 06/30/21                   Plan - 05/06/21 1614     Clinical Impression Statement Patient presents with good overall mobility today- able to respond to VC to take a longer step for short distances. He was able to respond to cues as well to rotate his trunk today with less stiffness/rigidity with activities. He does fatigue with activities but able to complete today. Patient will benefit from skilled PT services to address these deficits, improve balance, and decrease risk for future falls    Personal Factors and Comorbidities Comorbidity 3+    Comorbidities arthritis, bladder cancer, Trigeminal Neuralgia     Examination-Activity Limitations Caring for Others;Carry;Continence;Lift;Squat;Stairs;Stand    Examination-Participation Restrictions Community Activity;Yard Work    [employer] Evolving/Moderate complexity    Rehab Potential Good    PT Frequency 2x / week    PT Duration 12 weeks    PT Treatment/Interventions ADLs/Self Care Home Management;Cryotherapy;Canalith Repostioning;Electrical Stimulation;Moist Heat;DME Instruction;Gait training;Stair training;Functional mobility training;Therapeutic activities;Therapeutic exercise;Balance training;Neuromuscular re-education;Patient/family education;Manual techniques;Passive range of motion;Dry needling;Vestibular    PT Next Visit Plan LE strength, improve step length with ambulation, muscle tissue lengthening    PT Home Exercise Plan Access Code: [email or web address]             Patient will benefit from skilled therapeutic intervention in order to improve the following deficits and impairments:  Abnormal gait, Decreased activity tolerance, Decreased balance, Decreased coordination, Decreased endurance, Decreased mobility, Decreased range of motion, Decreased strength, Difficulty walking, Hypomobility, Impaired flexibility  Visit Diagnosis: Abnormality of gait and mobility  Difficulty in walking, not elsewhere classified  Muscle weakness (generalized)  Unsteadiness on feet     Problem List Patient Active Problem List   Diagnosis Date Noted   Class 2 obesity due to excess calories with body mass index (BMI) of 36.0 to 36.9 in adult 04/11/2020   Swelling of limb 03/28/2020   Lymphedema 03/28/2020   Trigeminal neuralgia 12/25/2019   Lower limb ulcer, calf, left, limited to breakdown of skin ([place]) 12/25/2019   OSA (obstructive sleep apnea) 34/19/3790   Acute systolic CHF (congestive heart failure) ([place]) 12/12/2018   Bruising 07/10/2018   Diet-controlled type 2 diabetes mellitus ([place]) 03/24/2018   Microalbuminuria 03/24/2018    Dizziness 11/17/2017   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   SOBOE (shortness of breath on exertion) 02/18/2017   Chronic midline low back pain without sciatica 12/29/2016   Periodic limb movement disorder 12/29/2016   Benign essential tremor 06/22/2016   Pure hypercholesterolemia 06/20/2015   Erectile dysfunction due to arterial insufficiency 06/16/2015   Unstable angina ([place]) 01/22/2015   Abdominal aortic 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 rupture 12/31/2014   Benign prostatic hypertrophy without urinary obstruction 07/31/2014   Enlarged prostate 07/31/2014   Benign essential HTN 06/11/2014   Carotid artery narrowing 02/08/2014   Carotid artery obstruction 02/08/2014   Bilateral carotid artery stenosis 02/08/2014   Chest pain 08/20/2013   Bilateral cataracts 05/16/2013   Cataract 05/16/2013   Clinical depression 12/12/2012   Diabetes mellitus, type 2 ([place]) 12/12/2012   Combined fat and carbohydrate induced hyperlipemia 12/12/2012   Major depressive disorder, single episode, unspecified 12/12/2012   Acid reflux 11/09/2012   Presence of stent in coronary artery 11/09/2012   BP (high blood pressure) 11/09/2012   Healed myocardial infarct 11/09/2012   Gastro-esophageal reflux disease without esophagitis 11/09/2012   History of cardiovascular surgery 11/09/2012   Fothergill's neuralgia 08/07/2012   Swelling  of testicle 04/06/2012   Disorder of male genital organ 04/06/2012   Swelling of the testicles 04/06/2012   Arteriosclerosis of coronary artery 11/16/2011   CAD in native artery 11/16/2011   Fatigue 06/04/2011   Avitaminosis D 11/30/2010    Lewis Moccasin, PT 05/06/2021, 5:15 PM  [place] MAIN [facility] SERVICES [address] [place], [place], [address] Phone: [phone]   Fax:  [phone]  Name: BRIANT ANGELILLO MRN: [id number] Date of Birth: Oct 27, 1945

## 2021-05-11 ENCOUNTER — Other Ambulatory Visit: Payer: Self-pay

## 2021-05-11 ENCOUNTER — Ambulatory Visit: Payer: Medicare HMO

## 2021-05-11 DIAGNOSIS — M6281 Muscle weakness (generalized): Secondary | ICD-10-CM

## 2021-05-11 DIAGNOSIS — G8929 Other chronic pain: Secondary | ICD-10-CM

## 2021-05-11 DIAGNOSIS — R2681 Unsteadiness on feet: Secondary | ICD-10-CM

## 2021-05-11 DIAGNOSIS — R269 Unspecified abnormalities of gait and mobility: Secondary | ICD-10-CM | POA: Diagnosis not present

## 2021-05-11 DIAGNOSIS — R278 Other lack of coordination: Secondary | ICD-10-CM

## 2021-05-11 DIAGNOSIS — R262 Difficulty in walking, not elsewhere classified: Secondary | ICD-10-CM

## 2021-05-11 NOTE — Therapy (Signed)
[place] MAIN [facility] SERVICES [address] [place], [place], [address] Phone: [phone]   Fax:  [phone]  Physical Therapy Treatment/Physical Therapy Progress Note   Dates of reporting period  2/72/023  to   05/11/2021  Patient Details  Name: [name] MRN: [id number] Date of Birth: 06-05-1945 Referring Provider (PT): Dr. Gurney Maxin   Encounter Date: 05/11/2021   PT End of Session - 05/11/21 0809     Visit Number 10    Number of Visits 25    Date for PT Re-Evaluation 06/30/21    Authorization Time Period Initial Certification= 3/0/1601- 06/30/2021; PN on 05/11/2021    Progress Note Due on Visit 20    PT Start Time 0800    PT Stop Time 0844    PT Time Calculation (min) 44 min    Equipment Utilized During Treatment Gait belt    Activity Tolerance Patient tolerated treatment well    Behavior During Therapy [facility] for tasks assessed/performed             Past Medical History:  Diagnosis Date   Anxiety    Arteriosclerosis of coronary artery 11/16/2011   Overview:  Stent 10/2011 stent rca 2015 with collaterals to lad which is chronically occluded    Benign enlargement of prostate    Benign essential HTN 06/11/2014   Benign prostatic hypertrophy without urinary obstruction 07/31/2014   Bilateral cataracts 05/16/2013   Overview:  Dr. [facility]     Bone spur of foot    Left   BP (high blood pressure) 11/09/2012   Cancer ([place])    skin (forehead) and bladder   Carotid artery narrowing 02/08/2014   Depression    Detrusor hypertrophy    Diabetes ([place])    Diabetes mellitus, type 2 ([place]) 12/12/2012   Diverticulosis    Dyspnea    Esophageal reflux    Esophageal reflux    Fothergill's neuralgia 08/07/2012   Overview:  [facility]    Gastritis    GERD (gastroesophageal reflux disease)    Headache    cluster headaches   Healed myocardial infarct 11/09/2012   Hearing loss in left ear    Heart disease    Hematuria     Hemorrhoids    History of hiatal hernia 12/14/2017   small    Hypercholesteremia    Lesion of bladder    Myocardial infarct (HCC)    Presence of stent in coronary artery 11/09/2012   Rectal bleeding    Trigeminal neuralgia    Trigeminal neuralgia    Valvular heart disease    Vitamin D deficiency     Past Surgical History:  Procedure Laterality Date   APPENDECTOMY     BOTOX INJECTION N/A 12/21/2017   Procedure: Bladder BOTOX INJECTION;  Surgeon: [name], MD;  Location: [facility] ORS;  Service: Urology;  Laterality: N/A;   BOTOX INJECTION N/A 09/11/2018   Procedure: Bladder BOTOX INJECTION;  Surgeon: [name], MD;  Location: [facility] ORS;  Service: Urology;  Laterality: N/A;   CARDIAC CATHETERIZATION     CARDIAC CATHETERIZATION N/A 01/22/2015   Procedure: Left Heart Cath;  Surgeon: Corey Skains, MD;  Location: [place] CV LAB;  Service: Cardiovascular;  Laterality: N/A;   CARDIAC CATHETERIZATION N/A 01/22/2015   Procedure: Coronary Stent Intervention;  Surgeon: Isaias Cowman, MD;  Location: [place] CV LAB;  Service: Cardiovascular;  Laterality: N/A;   CATARACT EXTRACTION, BILATERAL     COLONOSCOPY WITH PROPOFOL N/A 12/08/2015  Procedure: COLONOSCOPY WITH PROPOFOL;  Surgeon: Lollie Sails, MD;  Location: [facility] ENDOSCOPY;  Service: Endoscopy;  Laterality: N/A;   COLONOSCOPY WITH PROPOFOL N/A 12/09/2015   Procedure: COLONOSCOPY WITH PROPOFOL;  Surgeon: Lollie Sails, MD;  Location: [facility] ENDOSCOPY;  Service: Endoscopy;  Laterality: N/A;   CORONARY ANGIOPLASTY     5 stents   CORONARY STENT PLACEMENT  2015   x5   CYSTOSCOPY N/A 09/11/2018   Procedure: CYSTOSCOPY;  Surgeon: [name], MD;  Location: [facility] ORS;  Service: Urology;  Laterality: N/A;   CYSTOSCOPY WITH BIOPSY N/A 12/21/2017   Procedure: CYSTOSCOPY WITH BIOPSY;  Surgeon: [name], MD;  Location: [facility] ORS;  Service: Urology;  Laterality: N/A;   ESOPHAGOGASTRODUODENOSCOPY (EGD) WITH  PROPOFOL N/A 12/08/2015   Procedure: ESOPHAGOGASTRODUODENOSCOPY (EGD) WITH PROPOFOL;  Surgeon: Lollie Sails, MD;  Location: [facility] ENDOSCOPY;  Service: Endoscopy;  Laterality: N/A;   ESOPHAGOGASTRODUODENOSCOPY (EGD) WITH PROPOFOL N/A 12/13/2017   Procedure: ESOPHAGOGASTRODUODENOSCOPY (EGD) WITH PROPOFOL;  Surgeon: Lollie Sails, MD;  Location: [facility] ENDOSCOPY;  Service: Endoscopy;  Laterality: N/A;   EYE SURGERY     HERNIA REPAIR     kidney tumor remove     TRANSURETHRAL RESECTION OF BLADDER TUMOR WITH GYRUS (TURBT-GYRUS)  98/3382   UMBILICAL HERNIA REPAIR     urethral meatotomy      There were no vitals filed for this visit.   Subjective Assessment - 05/11/21 0807     Subjective Patient reports his back is sore today and that 8am is early.    Patient is accompained by: Family member    Pertinent History Patient is a 76 year old male with referral for Physical Therapy with diagnosis of Parkinsons disease and history of falling. He reports he diagnosed last year with Parkinsons and has improved his tremors with use of medication but reports worsening shuffling and difficulty with mobility. Patient has past medical history signifiant for Parkinsons; Arthritis, bladder cancer, Trigemic Neuralgia. He lives with wife in 1 level home and current using cane with reported recent fall.    Limitations Walking;Lifting;Standing;House hold activities    How long can you sit comfortably? No limits    How long can you stand comfortably? 15 min- limited due to back pain and foot numbess    How long can you walk comfortably? about 80 feet    Patient Stated Goals Improve my balance, be able to get up off the floor so I can maybe work in my garage again.    Currently in Pain? Yes    Pain Score 5     Pain Location Back    Pain Orientation Posterior;Lower    Pain Descriptors / Indicators Aching;Sore;Tightness    Pain Type Chronic pain    Pain Onset More than a month ago    Pain Frequency Intermittent     Aggravating Factors  Prolonged standing/walking    Pain Relieving Factors Rest    Effect of Pain on Daily Activities Difficulty with standing ADL's and walking in community             INTERVENTIONS:   Reassessed all goals today:     STG: HEP- Patient reports doing pretty good- compliant with walking program and his stretching/strengthening  LTG:  5xSTS= 18.65 sec without UE Support (ONGOING)  TUG= 18.85 sec with SPC (ONGOING) 10 MWT= 18.44 sec= 0.54 m/s (ONGOING)  FOTO=53 (GOAL MET)    Neuromuscular re-ed:  Standing Dynamic High knee marches x 20 reps alt LE  Forward/backward  step over 1/2 foam x 20 reps- Mild difficulty with backward stepping yet no LOB  Side step over 1/2 foam- BUE Support and VC for wider step. Mild difficulty initially- was able to improve with practice.     .                 PT Education - 05/11/21 0809     Education Details Exercise technique    Person(s) Educated Patient    Methods Explanation;Demonstration;Tactile cues;Verbal cues    Comprehension Verbalized understanding;Returned demonstration;Verbal cues required;Tactile cues required;Need further instruction              PT Short Term Goals - 05/11/21 7915       PT SHORT TERM GOAL #1   Title Pt will be independent with INITIAL  HEP in order to improve strength and balance in order to decrease fall risk and improve function at home and work.    Baseline 04/07/2021= No formal HEP in place. 05/11/2021= Patient reports doing pretty good- compliant with walking program and his stretching/strengthening.    Time 6    Period Weeks    Status Achieved    Target Date 05/19/21      PT SHORT TERM GOAL #2   Title Pt will decrease 5TSTS by at least 3 seconds in order to demonstrate clinically significant improvement in LE strength.    Baseline 04/07/2021= 25 sec; 05/11/2021= 18.65 sec    Time 6    Period Weeks    Status Achieved    Target Date 06/30/21                PT Long Term Goals - 05/11/21 0820       PT LONG TERM GOAL #1   Title Pt will be independent with FINAL HEP in order to improve strength and balance in order to decrease fall risk and improve function at home and work.    Baseline 04/07/2021= No formal HEP in place.    Time 12    Period Weeks    Status On-going    Target Date 06/30/21      PT LONG TERM GOAL #2   Title Pt will improve FOTO to target score of 45% to display perceived improvements in ability to complete ADL's.    Baseline 2/7/22023= 41%; FOTO=53%    Time 12    Period Weeks    Status Achieved    Target Date 06/30/21      PT LONG TERM GOAL #3   Title Pt will decrease 5TSTS by at least 5 seconds in order to demonstrate clinically significant improvement in LE strength.    Baseline 04/07/2021= 25 sec; 05/11/2021= 18.65 sec without UE support    Time 12    Period Weeks    Status On-going    Target Date 06/30/21      PT LONG TERM GOAL #4   Title Pt will decrease TUG to below 17 seconds/decrease in order to demonstrate decreased fall risk.    Baseline 04/07/2021= 21 sec with SPC; 05/11/2021= 18.85 sec with use of cane    Time 12    Period Weeks    Status On-going    Target Date 06/30/21      PT LONG TERM GOAL #5   Title Pt will increase 10MWT by at least 0.15 m/s in order to demonstrate clinically significant improvement in community ambulation.    Baseline 04/07/2021= 0.56 m/s using SPC; 05/11/2021= 0.54 m/s using SPC    Time  12    Period Weeks    Status New    Target Date 06/30/21                   Plan - 05/11/21 4388     Clinical Impression Statement Patient presents with good motivation to participate for today's 10th visit progress visit. He has made some gains with mobility including decreased TUG time while decreasing his 5xSTS as well. He scored higher on his FOTO indicating a clinically significant improvement in his self perceived functional ability. Patient's condition has the potential to improve in  response to therapy. Maximum improvement is yet to be obtained. The anticipated improvement is attainable and reasonable in a generally predictable time    Personal Factors and Comorbidities Comorbidity 3+    Comorbidities arthritis, bladder cancer, Trigeminal Neuralgia    Examination-Activity Limitations Caring for Others;Carry;Continence;Lift;Squat;Stairs;Stand    Examination-Participation Restrictions Community Activity;Yard Work    [employer] Evolving/Moderate complexity    Rehab Potential Good    PT Frequency 2x / week    PT Duration 12 weeks    PT Treatment/Interventions ADLs/Self Care Home Management;Cryotherapy;Canalith Repostioning;Electrical Stimulation;Moist Heat;DME Instruction;Gait training;Stair training;Functional mobility training;Therapeutic activities;Therapeutic exercise;Balance training;Neuromuscular re-education;Patient/family education;Manual techniques;Passive range of motion;Dry needling;Vestibular    PT Next Visit Plan LE strength, improve step length with ambulation, muscle tissue lengthening    PT Home Exercise Plan Access Code: [email or web address]             Patient will benefit from skilled therapeutic intervention in order to improve the following deficits and impairments:  Abnormal gait, Decreased activity tolerance, Decreased balance, Decreased coordination, Decreased endurance, Decreased mobility, Decreased range of motion, Decreased strength, Difficulty walking, Hypomobility, Impaired flexibility  Visit Diagnosis: Abnormality of gait and mobility  Difficulty in walking, not elsewhere classified  Muscle weakness (generalized)  Other lack of coordination  Unsteadiness on feet  Chronic bilateral low back pain without sciatica     Problem List Patient Active Problem List   Diagnosis Date Noted   Class 2 obesity due to excess calories with body mass index (BMI) of 36.0 to 36.9 in adult 04/11/2020   Swelling of limb 03/28/2020    Lymphedema 03/28/2020   Trigeminal neuralgia 12/25/2019   Lower limb ulcer, calf, left, limited to breakdown of skin ([place]) 12/25/2019   OSA (obstructive sleep apnea) 87/57/9728   Acute systolic CHF (congestive heart failure) ([place]) 12/12/2018   Bruising 07/10/2018   Diet-controlled type 2 diabetes mellitus ([place]) 03/24/2018   Microalbuminuria 03/24/2018   Dizziness 11/17/2017   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/2017   SOBOE (shortness of breath on exertion) 02/18/2017   Chronic midline low back pain without sciatica 12/29/2016   Periodic limb movement disorder 12/29/2016   Benign essential tremor 06/22/2016   Pure hypercholesterolemia 06/20/2015   Erectile dysfunction due to arterial insufficiency 06/16/2015   Unstable angina ([place]) 01/22/2015   Abdominal aortic 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 rupture 12/31/2014   Benign prostatic hypertrophy without urinary obstruction 07/31/2014   Enlarged prostate 07/31/2014   Benign essential HTN 06/11/2014   Carotid artery narrowing 02/08/2014   Carotid artery obstruction 02/08/2014   Bilateral carotid artery stenosis 02/08/2014   Chest pain 08/20/2013   Bilateral cataracts 05/16/2013   Cataract 05/16/2013   Clinical depression 12/12/2012   Diabetes mellitus, type 2 ([place]) 12/12/2012   Combined fat and carbohydrate induced hyperlipemia 12/12/2012   Major depressive disorder, single episode, unspecified 12/12/2012   Acid reflux 11/09/2012   Presence of stent in coronary artery 11/09/2012  BP (high blood pressure) 11/09/2012   Healed myocardial infarct 11/09/2012   Gastro-esophageal reflux disease without esophagitis 11/09/2012   History of cardiovascular surgery 11/09/2012   Fothergill's neuralgia 08/07/2012   Swelling of testicle 04/06/2012   Disorder of male genital organ 04/06/2012   Swelling of the testicles 04/06/2012   Arteriosclerosis of coronary artery 11/16/2011   CAD in native artery 11/16/2011   Fatigue 06/04/2011   Avitaminosis D  11/30/2010    Lewis Moccasin, PT 05/11/2021, 1:37 PM  [place] MAIN [facility] SERVICES [address] [place], [place], [address] Phone: [phone]   Fax:  [phone]  Name: EDMAN LIPSEY MRN: [id number] Date of Birth: June 29, 1945

## 2021-05-12 ENCOUNTER — Ambulatory Visit: Payer: Medicare HMO

## 2021-05-13 ENCOUNTER — Ambulatory Visit: Payer: Medicare HMO

## 2021-05-13 ENCOUNTER — Other Ambulatory Visit: Payer: Self-pay

## 2021-05-13 DIAGNOSIS — M6281 Muscle weakness (generalized): Secondary | ICD-10-CM

## 2021-05-13 DIAGNOSIS — R262 Difficulty in walking, not elsewhere classified: Secondary | ICD-10-CM

## 2021-05-13 DIAGNOSIS — R269 Unspecified abnormalities of gait and mobility: Secondary | ICD-10-CM

## 2021-05-13 DIAGNOSIS — R2681 Unsteadiness on feet: Secondary | ICD-10-CM

## 2021-05-13 DIAGNOSIS — R278 Other lack of coordination: Secondary | ICD-10-CM

## 2021-05-13 NOTE — Therapy (Signed)
[place] [facility] [facility] REH[facility] SERVICES ?[place], [place], [address] ?Phone: [phone]   Fax:  [phone] ? ?Physical Therapy Treatment ? ?Patient Details  ?Name: [name] ?MRN: [id number] ?Date of Birth: [birth year] ?Referring Provider (PT): Dr. Gurney Maxin ? ? ?Encounter Date: 05/13/2021 ? ? PT End of Session - 05/13/21 1108   ? ? Visit Number 15   ? Number of Visits 25   ? Date for PT Re-Evaluation 06/30/21   ? Authorization Time Period Initial Certification= 0/0/1749- 06/30/2021; PN on 05/11/2021   ? Progress Note Due on Visit 20   ? PT Start Time 1100   ? PT Stop Time 1144   ? PT Time Calculation (min) 44 min   ? Equipment Utilized During Treatment Gait belt   ? Activity Tolerance Patient tolerated treatment well   ? Behavior During Therapy [facility] for tasks assessed/performed   ? ?  ?  ? ?  ? ? ?Past Medical History:  ?Diagnosis Date  ? Anxiety   ? Arteriosclerosis of coronary artery 11/16/2011  ? Overview:  Stent 10/2011 stent rca 2015 with collaterals to lad which is chronically occluded   ? Benign enlargement of prostate   ? Benign essential HTN 06/11/2014  ? Benign prostatic hypertrophy without urinary obstruction 07/31/2014  ? Bilateral cataracts 05/16/2013  ? Overview:  [facility]. [facility]    ? Bone spur of foot   ? Left  ? BP (high blood pressure) 11/09/2012  ? Cancer [facility])   ? skin (forehead) and bladder  ? Carotid artery narrowing 02/08/2014  ? Depression   ? Detrusor hypertrophy   ? Diabetes ([place])   ? Diabetes mellitus, type 2 ([place]) 12/12/2012  ? Diverticulosis   ? Dyspnea   ? Esophageal reflux   ? Esophageal reflux   ? Fothergill's neuralgia 08/07/2012  ? Overview:  [facility]   ? Gastritis   ? GERD (gastroesophageal reflux disease)   ? Headache   ? cluster headaches  ? 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 Hearing loss in left ear   ? Heart disease   ? Hematuria   ? Hemorrhoids   ? History of hiatal hernia 12/14/2017  ? small   ? Hypercholesteremia   ?  Lesion of bladder   ? Myocardial infarct [facility])   ? Presence of stent in coronary artery 11/09/2012  ? Rectal bleeding   ? Trigeminal neuralgia   ? Trigeminal neuralgia   ? Valvular heart disease   ? Vitamin D deficiency   ? ? ?Past Surgical History:  ?Procedure Laterality Date  ? APPENDECTOMY    ? BOTOX INJECTION N/A 12/21/2017  ? Procedure: Bladder BOTOX INJECTION;  Surgeon: [name], MD;  Location: [facility] ORS;  Service: Urology;  Laterality: N/A;  ? BOTOX INJECTION N/A 09/11/2018  ? Procedure: Bladder BOTOX INJECTION;  Surgeon: [name], MD;  Location: [facility] ORS;  Service: Urology;  Laterality: N/A;  ? CARDIAC CATHETERIZATION    ? CARDIAC CATHETERIZATION N/A 01/22/2015  ? Procedure: Left Heart Cath;  Surgeon: Corey Skains, MD;  Location: [place] CV LAB;  Service: Cardiovascular;  Laterality: N/A;  ? CARDIAC CATHETERIZATION N/A 01/22/2015  ? Procedure: Coronary Stent Intervention;  Surgeon: Isaias Cowman, MD;  Location: [place] CV LAB;  Service: Cardiovascular;  Laterality: N/A;  ? CATARACT EXTRACTION, BILATERAL    ? COLONOSCOPY WITH PROPOFOL N/A 12/08/2015  ? Procedure: COLONOSCOPY WITH PROPOFOL;  Surgeon: Lollie Sails, MD;  Location: [facility] ENDOSCOPY;  Service: Endoscopy;  Laterality: N/A;  ? COLONOSCOPY WITH PROPOFOL N/A 12/09/2015  ? Procedure: COLONOSCOPY WITH PROPOFOL;  Surgeon: Lollie Sails, MD;  Location: [facility] ENDOSCOPY;  Service: Endoscopy;  Laterality: N/A;  ? CORONARY ANGIOPLASTY    ? 5 stents  ? CORONARY STENT PLACEMENT  2015  ? x5  ? CYSTOSCOPY N/A 09/11/2018  ? Procedure: CYSTOSCOPY;  Surgeon: [name], MD;  Location: [facility] ORS;  Service: Urology;  Laterality: N/A;  ? CYSTOSCOPY WITH BIOPSY N/A 12/21/2017  ? Procedure: CYSTOSCOPY WITH BIOPSY;  Surgeon: [name], MD;  Location: [facility] ORS;  Service: Urology;  Laterality: N/A;  ? ESOPHAGOGASTRODUODENOSCOPY (EGD) WITH PROPOFOL N/A 12/08/2015  ? Procedure: ESOPHAGOGASTRODUODENOSCOPY (EGD) WITH PROPOFOL;   Surgeon: Lollie Sails, MD;  Location[facility] ENDOSCOPY;  Service: Endoscopy;  Laterality: N/A;  ? ESOPHAGOGASTRODUODENOSCOPY (EGD) WITH PROPOFOL N/A 12/13/2017  ? Procedure: ESOPHAGOGASTRODUODENOSCOPY (EGD) WITH PROPOFOL;  Surgeon: Lollie Sails, MD;  Location: [facility] ENDOSCOPY;  Service: Endoscopy;  Laterality: N/A;  ? EYE SURGERY    ? HERNIA REPAIR    ? kidney tumor remove    ? TRANSURETHRAL RESECTION OF BLADDER TUMOR WITH GYRUS (TURBT-GYRUS)  12/2013  ? UMBILICAL HERNIA REPAIR    ? urethral meatotomy    ? ? ?There were no vitals filed for this visit. ? ? Subjective Assessment - 05/13/21 1106   ? ? Subjective Patient reports he likes the later times much better than 8am and states he feels he is doing some better today.   ? Patient is accompained by: Family member   ? Pertinent History Patient is a 76 year old male with referral for Physical Therapy with diagnosis of Parkinsons disease and history of falling. He reports he diagnosed last year with Parkinsons and has improved his tremors with use of medication but reports worsening shuffling and difficulty with mobility. Patient has past medical history signifiant for Parkinsons; Arthritis, bladder cancer, Trigemic Neuralgia. He lives with wife in 1 level home and current using cane with reported recent fall.   ? Limitations Walking;Lifting;Standing;House hold activities   ? How long can you sit comfortably? No limits   ? How long can you stand comfortably? 15 min- limited due to back pain and foot numbess   ? How long can you walk comfortably? about 80 feet   ? Patient Stated Goals Improve my balance, be able to get up off the floor so I can maybe work in my garage again.   ? Currently in Pain? Yes   ? Pain Score 7    ? Pain Location Back   ? Pain Orientation Posterior;Lower   ? Pain Descriptors / Indicators Aching;Sore   ? Pain Type Chronic pain   ? Pain Onset More than a month ago   ? Pain Frequency Constant   ? Aggravating Factors  Prolonged  standing/walking   ? Pain Relieving Factors Rest   ? Effect of Pain on Daily Activities Difficulty with standing ADLs and walking in community   ? ?  ?  ? ?  ? ? ?INTERVENTIONS:  ? ? ?HIIT training on Nustep (while on Moist heat for low back)  ?1 min at level 0 ?1 min at level 3 ?Repeat til 8 min completed. 0.33 mi today. ? ?Gait to bathroom and back - 80 feet x 2 using SPC ( VC for increased step length and erect posture.  ? ?Sit to stand x 10 reps without UE support - Good form- patient reported as medium. ? ?Turning 90  deg in 2 steps (focusing on ER at hip and picking foot up). Much more difficutly going to right than left. 15 reps each direction.  ? ?Step forward lunges x 15 reps BLE ? ?Forward/Retro gait in // bars down and back x 10 each. VC to take a longer step backward otherwise good reciprocal steps forward.  ? ?Education provided throughout session via VC/TC and demonstration to facilitate movement at target joints and correct muscle activation for all testing and exercises performed.  ? ? ? ? ? ? ? ? ? ? ? ? ? ? ? ? ? ? ? ? ? PT Education - 05/13/21 1108   ? ? Education Details Exercise technique   ? Person(s) Educated Patient   ? Methods Explanation;Demonstration;Tactile cues;Verbal cues   ? Comprehension Verbalized understanding;Returned demonstration;Verbal cues required;Tactile cues required;Need further instruction   ? ?  ?  ? ?  ? ? ? PT Short Term Goals - 05/11/21 0812   ? ?  ? PT SHORT TERM GOAL #1  ? Title Pt will be independent with INITIAL  HEP in order to improve strength and balance in order to decrease fall risk and improve function at home and work.   ? Baseline 04/07/2021= No formal HEP in place. 05/11/2021= Patient reports doing pretty good- compliant with walking program and his stretching/strengthening.   ? Time 6   ? Period Weeks   ? Status Achieved   ? Target Date 05/19/21   ?  ? PT SHORT TERM GOAL #2  ? Title Pt will decrease 5TSTS by at least 3 seconds in order to demonstrate  clinically significant improvement in LE strength.   ? Baseline 04/07/2021= 25 sec; 05/11/2021= 18.65 sec   ? Time 6   ? Period Weeks   ? Status Achieved   ? Target Date 06/30/21   ? ?  ?  ? ?  ? ? ? ? PT Long Term Goals - 03/13/2

## 2021-05-15 ENCOUNTER — Ambulatory Visit: Payer: Medicare HMO

## 2021-05-19 ENCOUNTER — Telehealth: Payer: Self-pay | Admitting: Urology

## 2021-05-19 ENCOUNTER — Ambulatory Visit: Payer: Medicare HMO

## 2021-05-19 MED ORDER — OXYBUTYNIN CHLORIDE ER 10 MG PO TB24: 10.0000 mg | ORAL_TABLET | Freq: Every day | ORAL | 3 refills | Status: DC

## 2021-05-19 NOTE — Telephone Encounter (Signed)
Medication refilled. Pt aware to keep follow up as scheduled. ?

## 2021-05-19 NOTE — Telephone Encounter (Signed)
Patient called requesting a refill on oxybutynin (DITROPAN-XL) 10 MG 24 hr tablet [[id number]] 90 day supply. ?He has changed to [facility] in [place] and they told him that he would need a new RX. ?Thanks, ?Sharyn Lull ?

## 2021-05-20 ENCOUNTER — Ambulatory Visit: Payer: Medicare HMO

## 2021-05-21 ENCOUNTER — Ambulatory Visit: Payer: Medicare HMO

## 2021-05-22 ENCOUNTER — Ambulatory Visit: Payer: Medicare HMO

## 2021-05-22 ENCOUNTER — Other Ambulatory Visit: Payer: Self-pay

## 2021-05-22 DIAGNOSIS — R269 Unspecified abnormalities of gait and mobility: Secondary | ICD-10-CM

## 2021-05-22 DIAGNOSIS — M545 Low back pain, unspecified: Secondary | ICD-10-CM

## 2021-05-22 DIAGNOSIS — R262 Difficulty in walking, not elsewhere classified: Secondary | ICD-10-CM

## 2021-05-22 DIAGNOSIS — R2681 Unsteadiness on feet: Secondary | ICD-10-CM

## 2021-05-22 DIAGNOSIS — M6281 Muscle weakness (generalized): Secondary | ICD-10-CM

## 2021-05-22 DIAGNOSIS — R278 Other lack of coordination: Secondary | ICD-10-CM

## 2021-05-22 NOTE — Therapy (Signed)
[place] ?[place], [place], [address] ?Phone: [phone]   Fax:  [phone] ? ?Physical Therapy Treatment ? ?Patient Details  ?Name: [name] ?MRN: [id number] ?Date of Birth: 1946/01/10 ?Referring Provider (PT): Dr. Gurney Maxin ? ? ?Encounter Date: 05/22/2021 ? ? PT End of Session - 05/22/21 1008   ? ? Visit Number 16   ? Number of Visits 25   ? Date for PT Re-Evaluation 06/30/21   ? Authorization Time Period Initial Certification= 03/08/4079- 06/30/2021; PN on 05/11/2021   ? Progress Note Due on Visit 20   ? PT Start Time 1010   ? PT Stop Time 1050   ? PT Time Calculation (min) 40 min   ? Equipment Utilized During Treatment Gait belt   ? Activity Tolerance Patient tolerated treatment well   ? Behavior During Therapy [facility] for tasks assessed/performed   ? ?  ?  ? ?  ? ? ?Past Medical History:  ?Diagnosis Date  ? Anxiety   ? Arteriosclerosis of coronary artery 11/16/2011  ? Overview:  Stent 10/2011 stent rca 2015 with collaterals to lad which is chronically occluded   ? Benign enlargement of prostate   ? Benign essential HTN 06/11/2014  ? Benign prostatic hypertrophy without urinary obstruction 07/31/2014  ? Bilateral cataracts 05/16/2013  ? Overview:  Dr. [facility]    ? Bone spur of foot   ? Left  ? BP (high blood pressure) 11/09/2012  ? Cancer [facility])   ? skin (forehead) and bladder  ? Carotid artery narrowing 02/08/2014  ? Depression   ? Detrusor hypertrophy   ? Diabetes ([place])   ? Diabetes mellitus, type 2 ([place]) 12/12/2012  ? Diverticulosis   ? Dyspnea   ? Esophageal reflux   ? Esophageal reflux   ? Fothergill's neuralgia 08/07/2012  ? Overview:  [facility]   ? Gastritis   ? GERD (gastroesophageal reflux disease)   ? Headache   ? cluster headaches  ? 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 Hearing loss in left ear   ? Heart disease   ? Hematuria   ? Hemorrhoids   ? History of hiatal hernia 12/14/2017  ? small   ? Hypercholesteremia   ?  Lesion of bladder   ? Myocardial infarct [facility])   ? Presence of stent in coronary artery 11/09/2012  ? Rectal bleeding   ? Trigeminal neuralgia   ? Trigeminal neuralgia   ? Valvular heart disease   ? Vitamin D deficiency   ? ? ?Past Surgical History:  ?Procedure Laterality Date  ? APPENDECTOMY    ? BOTOX INJECTION N/A 12/21/2017  ? Procedure: Bladder BOTOX INJECTION;  Surgeon: [name], MD;  Location: [facility] ORS;  Service: Urology;  Laterality: N/A;  ? BOTOX INJECTION N/A 09/11/2018  ? Procedure: Bladder BOTOX INJECTION;  Surgeon: [name], MD;  Location: [facility] ORS;  Service: Urology;  Laterality: N/A;  ? CARDIAC CATHETERIZATION    ? CARDIAC CATHETERIZATION N/A 01/22/2015  ? Procedure: Left Heart Cath;  Surgeon: Corey Skains, MD;  Location: [place] CV LAB;  Service: Cardiovascular;  Laterality: N/A;  ? CARDIAC CATHETERIZATION N/A 01/22/2015  ? Procedure: Coronary Stent Intervention;  Surgeon: Isaias Cowman, MD;  Location: [place] CV LAB;  Service: Cardiovascular;  Laterality: N/A;  ? CATARACT EXTRACTION, BILATERAL    ? COLONOSCOPY WITH PROPOFOL N/A 12/08/2015  ? Procedure: COLONOSCOPY WITH PROPOFOL;  Surgeon: Lollie Sails, MD;  Location: [facility] ENDOSCOPY;  Service: Endoscopy;  Laterality: N/A;  ? COLONOSCOPY WITH PROPOFOL N/A 12/09/2015  ? Procedure: COLONOSCOPY WITH PROPOFOL;  Surgeon: Lollie Sails, MD;  Location: [facility] ENDOSCOPY;  Service: Endoscopy;  Laterality: N/A;  ? CORONARY ANGIOPLASTY    ? 5 stents  ? CORONARY STENT PLACEMENT  2015  ? x5  ? CYSTOSCOPY N/A 09/11/2018  ? Procedure: CYSTOSCOPY;  Surgeon: [name], MD;  Location: [facility] ORS;  Service: Urology;  Laterality: N/A;  ? CYSTOSCOPY WITH BIOPSY N/A 12/21/2017  ? Procedure: CYSTOSCOPY WITH BIOPSY;  Surgeon: [name], MD;  Location: [facility] ORS;  Service: Urology;  Laterality: N/A;  ? ESOPHAGOGASTRODUODENOSCOPY (EGD) WITH PROPOFOL N/A 12/08/2015  ? Procedure: ESOPHAGOGASTRODUODENOSCOPY (EGD) WITH PROPOFOL;   Surgeon: Lollie Sails, MD;  Location: [facility] ENDOSCOPY;  Service: Endoscopy;  Laterality: N/A;  ? ESOPHAGOGASTRODUODENOSCOPY (EGD) WITH PROPOFOL N/A 12/13/2017  ? Procedure: ESOPHAGOGASTRODUODENOSCOPY (EGD) WITH PROPOFOL;  Surgeon: Lollie Sails, MD;  Location: [facility] ENDOSCOPY;  Service: Endoscopy;  Laterality: N/A;  ? EYE SURGERY    ? HERNIA REPAIR    ? kidney tumor remove    ? TRANSURETHRAL RESECTION OF BLADDER TUMOR WITH GYRUS (TURBT-GYRUS)  12/2013  ? UMBILICAL HERNIA REPAIR    ? urethral meatotomy    ? ? ?There were no vitals filed for this visit. ? ? Subjective Assessment - 05/22/21 1007   ? ? Subjective Patient reports having a rough day with his back. States difficulty walking and not moving around well today.   ? Patient is accompained by: Family member   ? Pertinent History Patient is a 76 year old male with referral for Physical Therapy with diagnosis of Parkinsons disease and history of falling. He reports he diagnosed last year with Parkinsons and has improved his tremors with use of medication but reports worsening shuffling and difficulty with mobility. Patient has past medical history signifiant for Parkinsons; Arthritis, bladder cancer, Trigemic Neuralgia. He lives with wife in 1 level home and current using cane with reported recent fall.   ? Limitations Walking;Lifting;Standing;House hold activities   ? How long can you sit comfortably? No limits   ? How long can you stand comfortably? 15 min- limited due to back pain and foot numbess   ? How long can you walk comfortably? about 80 feet   ? Patient Stated Goals Improve my balance, be able to get up off the floor so I can maybe work in my garage again.   ? Currently in Pain? Yes   ? Pain Score 8    ? Pain Location Back   ? Pain Orientation Right;Left;Posterior;Lower   ? Pain Descriptors / Indicators Aching;Sore;Tightness   ? Pain Onset More than a month ago   ? Pain Frequency Constant   ? Aggravating Factors  Prolonged Standing/walking   ?  Pain Relieving Factors Rest, Heat   ? Effect of Pain on Daily Activities Difficulty with standing/ADL's and walking in community   ? ?  ?  ? ?  ? ? ?INTERVENTIONS:  ? ? ?Manual Therapy:  (in [place]e while moist heat to low back)  ? ?- B single knee to chest - hold 30 sec x 4 sets ?-Lower trunk rotation- Hold 30 sec x 4 sets each LE ?-Lower trunk rotation - 0 sec hold x 30 to each side ?-Piriformis stretch- hold 30 sec x 4 sets ?- Double knee to chest- hold 20 sec x 4 sets ? ?Therapeutic Activity:  ?Instruction in log roll - sit to supine and later -  supine to sit going to his left. Patient able to roll and transfer with min assist and required CGA and VC for technique ? ?Gait training:  ? ?Patient ambulated 50 feet x 2 trials with use of cane - initially with shuffling gait and forward flexed posture requiring 1 min 38 sec. He did improve on second trial (1 min 11 sec)  but continued to be limited by low back pain.  ? ?Education provided throughout session via VC/TC and demonstration to facilitate movement at target joints and correct muscle activation for all testing and exercises performed.  ? ? ? ? ? ? ? ? ? ? ? ? ? ? ? ? ? ? ? ? ? ? ? ? ? PT Education - 05/22/21 1007   ? ? Education Details Exercise technique   ? Person(s) Educated Patient   ? Methods Explanation;Demonstration;Tactile cues;Verbal cues   ? Comprehension Returned demonstration;Verbal cues required;Need further instruction;Verbalized understanding;Tactile cues required   ? ?  ?  ? ?  ? ? ? PT Short Term Goals - 05/11/21 0812   ? ?  ? PT SHORT TERM GOAL #1  ? Title Pt will be independent with INITIAL  HEP in order to improve strength and balance in order to decrease fall risk and improve function at home and work.   ? Baseline 04/07/2021= No formal HEP in place. 05/11/2021= Patient reports doing pretty good- compliant with walking program and his stretching/strengthening.   ? Time 6   ? Period Weeks   ? Status Achieved   ? Target Date 05/19/21   ?  ?  PT SHORT TERM GOAL #2  ? Title Pt will decrease 5TSTS by at least 3 seconds in order to demonstrate clinically significant improvement in LE strength.   ? Baseline 04/07/2021= 25 sec; 05/11/2021= 18.65 sec   ? T

## 2021-05-25 ENCOUNTER — Other Ambulatory Visit: Payer: Self-pay

## 2021-05-25 ENCOUNTER — Ambulatory Visit: Payer: Medicare HMO

## 2021-05-25 DIAGNOSIS — R2689 Other abnormalities of gait and mobility: Secondary | ICD-10-CM

## 2021-05-25 DIAGNOSIS — R262 Difficulty in walking, not elsewhere classified: Secondary | ICD-10-CM

## 2021-05-25 DIAGNOSIS — M6281 Muscle weakness (generalized): Secondary | ICD-10-CM

## 2021-05-25 DIAGNOSIS — R2681 Unsteadiness on feet: Secondary | ICD-10-CM

## 2021-05-25 DIAGNOSIS — R278 Other lack of coordination: Secondary | ICD-10-CM

## 2021-05-25 DIAGNOSIS — R269 Unspecified abnormalities of gait and mobility: Secondary | ICD-10-CM | POA: Diagnosis not present

## 2021-05-25 NOTE — Therapy (Signed)
[place] [facility] ?[place], [place], [address] ?Phone: [phone]   Fax:  [phone] ? ?Physical Therapy Treatment ? ?Patient Details  ?Name: [name] ?MRN: [id number] ?Date of Birth: [birth year] ?Referring Provider (PT): Dr. Gurney Maxin ? ? ?Encounter Date: 05/25/2021 ? ? PT End of Session - 05/25/21 6073   ? ? Visit Number 17   ? Number of Visits 25   ? Date for PT Re-Evaluation 06/30/21   ? Authorization Time Period Initial Certification= 08/29/624- 06/30/2021; PN on 05/11/2021   ? Progress Note Due on Visit 20   ? PT Start Time [phone]   ? PT Stop Time 1013   ? PT Time Calculation (min) 40 min   ? Equipment Utilized During Treatment Gait belt   ? Activity Tolerance Patient tolerated treatment well   ? Behavior During Therapy [facility] for tasks assessed/performed   ? ?  ?  ? ?  ? ? ?Past Medical History:  ?Diagnosis Date  ? Anxiety   ? Arteriosclerosis of coronary artery 11/16/2011  ? Overview:  Stent 10/2011 stent rca 2015 with collaterals to lad which is chronically occluded   ? Benign enlargement of prostate   ? Benign essential HTN 06/11/2014  ? Benign prostatic hypertrophy without urinary obstruction 07/31/2014  ? Bilateral cataracts 05/16/2013  ? Overview:  [facility]. [facility]    ? Bone spur of foot   ? Left  ? BP (high blood pressure) 11/09/2012  ? Cancer [facility])   ? skin (forehead) and bladder  ? Carotid artery narrowing 02/08/2014  ? Depression   ? Detrusor hypertrophy   ? Diabetes ([place])   ? Diabetes mellitus, type 2 ([place]) 12/12/2012  ? Diverticulosis   ? Dyspnea   ? Esophageal reflux   ? Esophageal reflux   ? Fothergill's neuralgia 08/07/2012  ? Overview:  [facility]   ? Gastritis   ? GERD (gastroesophageal reflux disease)   ? Headache   ? cluster headaches  ? 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 Hearing loss in left ear   ? Heart disease   ? Hematuria   ? Hemorrhoids   ? History of hiatal hernia 12/14/2017  ? small   ? Hypercholesteremia   ?  Lesion of bladder   ? Myocardial infarct [facility])   ? Presence of stent in coronary artery 11/09/2012  ? Rectal bleeding   ? Trigeminal neuralgia   ? Trigeminal neuralgia   ? Valvular heart disease   ? Vitamin D deficiency   ? ? ?Past Surgical History:  ?Procedure Laterality Date  ? APPENDECTOMY    ? BOTOX INJECTION N/A 12/21/2017  ? Procedure: Bladder BOTOX INJECTION;  Surgeon: [name], MD;  Location: [facility] ORS;  Service: Urology;  Laterality: N/A;  ? BOTOX INJECTION N/A 09/11/2018  ? Procedure: Bladder BOTOX INJECTION;  Surgeon: [name], MD;  Location: [facility] ORS;  Service: Urology;  Laterality: N/A;  ? CARDIAC CATHETERIZATION    ? CARDIAC CATHETERIZATION N/A 01/22/2015  ? Procedure: Left Heart Cath;  Surgeon: Corey Skains, MD;  Location: [place] CV LAB;  Service: Cardiovascular;  Laterality: N/A;  ? CARDIAC CATHETERIZATION N/A 01/22/2015  ? Procedure: Coronary Stent Intervention;  Surgeon: Isaias Cowman, MD;  Location: [place] CV LAB;  Service: Cardiovascular;  Laterality: N/A;  ? CATARACT EXTRACTION, BILATERAL    ? COLONOSCOPY WITH PROPOFOL N/A 12/08/2015  ? Procedure: COLONOSCOPY WITH PROPOFOL;  Surgeon: Lollie Sails, MD;  Location: [facility] ENDOSCOPY;  Service: Endoscopy;  Laterality: N/A;  ? COLONOSCOPY WITH PROPOFOL N/A 12/09/2015  ? Procedure: COLONOSCOPY WITH PROPOFOL;  Surgeon: Lollie Sails, MD;  Location: [facility] ENDOSCOPY;  Service: Endoscopy;  Laterality: N/A;  ? CORONARY ANGIOPLASTY    ? 5 stents  ? CORONARY STENT PLACEMENT  2015  ? x5  ? CYSTOSCOPY N/A 09/11/2018  ? Procedure: CYSTOSCOPY;  Surgeon: [name], MD;  Location: [facility] ORS;  Service: Urology;  Laterality: N/A;  ? CYSTOSCOPY WITH BIOPSY N/A 12/21/2017  ? Procedure: CYSTOSCOPY WITH BIOPSY;  Surgeon: [name], MD;  Location: [facility] ORS;  Service: Urology;  Laterality: N/A;  ? ESOPHAGOGASTRODUODENOSCOPY (EGD) WITH PROPOFOL N/A 12/08/2015  ? Procedure: ESOPHAGOGASTRODUODENOSCOPY (EGD) WITH PROPOFOL;   Surgeon: Lollie Sails, MD;  Location: [facility] ENDOSCOPY;  Service: Endoscopy;  Laterality: N/A;  ? ESOPHAGOGASTRODUODENOSCOPY (EGD) WITH PROPOFOL N/A 12/13/2017  ? Procedure: ESOPHAGOGASTRODUODENOSCOPY (EGD) WITH PROPOFOL;  Surgeon: Lollie Sails, MD;  Location: [facility] ENDOSCOPY;  Service: Endoscopy;  Laterality: N/A;  ? EYE SURGERY    ? HERNIA REPAIR    ? kidney tumor remove    ? TRANSURETHRAL RESECTION OF BLADDER TUMOR WITH GYRUS (TURBT-GYRUS)  12/2013  ? UMBILICAL HERNIA REPAIR    ? urethral meatotomy    ? ? ?There were no vitals filed for this visit. ? ? Subjective Assessment - 05/25/21 0935   ? ? Subjective Patient [place] and "over[place]"   ? Patient is accompained by: Family member   ? Pertinent History Patient is a 76 year old male with referral for Physical Therapy with diagnosis of Parkinsons disease and history of falling. He reports he diagnosed last year with Parkinsons and has improved his tremors with use of medication but reports worsening shuffling and difficulty with mobility. Patient has past medical history signifiant for Parkinsons; Arthritis, bladder cancer, Trigemic Neuralgia. He lives with wife in 1 level home and current using cane with reported recent fall.   ? Limitations Walking;Lifting;Standing;House hold activities   ? How long can you sit comfortably? No limits   ? How long can you stand comfortably? 15 min- limited due to back pain and foot numbess   ? How long can you walk comfortably? about 80 feet   ? Patient Stated Goals Improve my balance, be able to get up off the floor so I can maybe work in my garage again.   ? Currently in Pain? Yes   ? Pain Score 8    ? Pain Location Back   ? Pain Orientation Left;Right;Posterior;Lower   ? Pain Descriptors / Indicators Aching   ? Pain Type Chronic pain   ? Pain Onset More than a month ago   ? Pain Frequency Constant   ? Aggravating Factors  Prolonged Standing/walking   ? Pain Relieving Factors  Rest   ? Effect of Pain on Daily Activities Difficulty with standing/ADL's and walking in community   ? ?  ?  ? ?  ? ? ?INTERVENTIONS:  ? ?Therex (Supine on mat table with moist heat to low back)  ? ?Heel slides B LE x 15 reps. Using slide board with pillow case. No LBP reported  ?Hip abd B x 12 reps each- Using slide board with pillow case. No LBP reported  ?Hip Add  with ball squeeze- 5 sec hold x 10 reps ?SAQ with 3lb x 15 reps each. Use of wedge under legs ?Lower trunk rotation x 20 reps each.  Initially AAROM to demonstrate  technique then patient able to progress to active.  ? ?Log roll technique from supine to sit. VC?TC and visual Demo for correct technique.  ? ?Education provided throughout session via VC/TC and demonstration to facilitate movement at target joints and correct muscle activation for all testing and exercises performed.  ? ? ? ? ? ? ? ? ? ? ? ? ? ? ? ? ? ? ? ? ? ? ? ? PT Education - 05/25/21 0952   ? ? Education Details Exercise technique   ? Person(s) Educated Patient   ? Methods Explanation;Demonstration;Tactile cues;Verbal cues   ? Comprehension Verbalized understanding;Returned demonstration;Verbal cues required;Tactile cues required;Need further instruction   ? ?  ?  ? ?  ? ? ? PT Short Term Goals - 05/11/21 0812   ? ?  ? PT SHORT TERM GOAL #1  ? Title Pt will be independent with INITIAL  HEP in order to improve strength and balance in order to decrease fall risk and improve function at home and work.   ? Baseline 04/07/2021= No formal HEP in place. 05/11/2021= Patient reports doing pretty good- compliant with walking program and his stretching/strengthening.   ? Time 6   ? Period Weeks   ? Status Achieved   ? Target Date 05/19/21   ?  ? PT SHORT TERM GOAL #2  ? Title Pt will decrease 5TSTS by at least 3 seconds in order to demonstrate clinically significant improvement in LE strength.   ? Baseline 04/07/2021= 25 sec; 05/11/2021= 18.65 sec   ? Time 6   ? Period Weeks   ? Status Achieved   ?  Target Date 06/30/21   ? ?  ?  ? ?  ? ? ? ? PT Long Term Goals - 05/11/21 0820   ? ?  ? PT LONG TERM GOAL #1  ? Title Pt will be independent with FINAL HEP in order to improve strength and balance in order to

## 2021-05-28 ENCOUNTER — Ambulatory Visit: Payer: Medicare HMO

## 2021-06-01 ENCOUNTER — Ambulatory Visit: Payer: Medicare HMO | Attending: Neurology

## 2021-06-01 DIAGNOSIS — R278 Other lack of coordination: Secondary | ICD-10-CM | POA: Diagnosis present

## 2021-06-01 DIAGNOSIS — M545 Low back pain, unspecified: Secondary | ICD-10-CM | POA: Insufficient documentation

## 2021-06-01 DIAGNOSIS — G8929 Other chronic pain: Secondary | ICD-10-CM | POA: Insufficient documentation

## 2021-06-01 DIAGNOSIS — R2689 Other abnormalities of gait and mobility: Secondary | ICD-10-CM | POA: Diagnosis present

## 2021-06-01 DIAGNOSIS — R262 Difficulty in walking, not elsewhere classified: Secondary | ICD-10-CM | POA: Diagnosis present

## 2021-06-01 DIAGNOSIS — R269 Unspecified abnormalities of gait and mobility: Secondary | ICD-10-CM | POA: Insufficient documentation

## 2021-06-01 DIAGNOSIS — M6281 Muscle weakness (generalized): Secondary | ICD-10-CM | POA: Diagnosis present

## 2021-06-01 DIAGNOSIS — R2681 Unsteadiness on feet: Secondary | ICD-10-CM | POA: Insufficient documentation

## 2021-06-01 NOTE — Therapy (Signed)
[facility] ?[place], [place], [address] ?Phone: [phone]   Fax:  [phone] ? ?Physical Therapy Treatment ? ?Patient Details  ?Name: [name] ?MRN: [id number] ?Date of Birth: 04-05-1945 ?Referring Provider (PT): Dr. Gurney Maxin ? ? ?Encounter Date: 06/01/2021 ? ? PT End of Session - 06/01/21 1251   ? ? Visit Number 18   ? Number of Visits 25   ? Date for PT Re-Evaluation 06/30/21   ? Authorization Time Period Initial Certification= 04/01/2246- 06/30/2021; PN on 05/11/2021   ? Progress Note Due on Visit 20   ? PT Start Time 0930   ? PT Stop Time 1015   ? PT Time Calculation (min) 45 min   ? Equipment Utilized During Treatment Gait belt   ? Activity Tolerance Patient tolerated treatment well   ? Behavior During Therapy [facility] for tasks assessed/performed   ? ?  ?  ? ?  ? ? ?Past Medical History:  ?Diagnosis Date  ? Anxiety   ? Arteriosclerosis of coronary artery 11/16/2011  ? Overview:  Stent 10/2011 stent rca 2015 with collaterals to lad which is chronically occluded   ? Benign enlargement of prostate   ? Benign essential HTN 06/11/2014  ? Benign prostatic hypertrophy without urinary obstruction 07/31/2014  ? Bilateral cataracts 05/16/2013  ? Overview:  [facility]    ? Bone spur of foot   ? Left  ? BP (high blood pressure) 11/09/2012  ? Cancer [facility])   ? skin (forehead) and bladder  ? Carotid artery narrowing 02/08/2014  ? Depression   ? Detrusor hypertrophy   ? Diabetes ([place])   ? Diabetes mellitus, type 2 ([place]) 12/12/2012  ? Diverticulosis   ? Dyspnea   ? Esophageal reflux   ? Esophageal reflux   ? Fothergill's neuralgia 08/07/2012  ? Overview:  [facility]   ? Gastritis   ? GERD (gastroesophageal reflux disease)   ? Headache   ? cluster headaches  ? 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 Hearing loss in left ear   ? Heart disease   ? Hematuria   ? Hemorrhoids   ? History of hiatal hernia 12/14/2017  ? small   ? Hypercholesteremia   ?  Lesion of bladder   ? Myocardial infarct [facility])   ? Presence of stent in coronary artery 11/09/2012  ? Rectal bleeding   ? Trigeminal neuralgia   ? Trigeminal neuralgia   ? Valvular heart disease   ? Vitamin D deficiency   ? ? ?Past Surgical History:  ?Procedure Laterality Date  ? APPENDECTOMY    ? BOTOX INJECTION N/A 12/21/2017  ? Procedure: Bladder BOTOX INJECTION;  Surgeon: [name], MD;  Location: [facility]S;  Service: Urology;  Laterality: N/A;  ? BOTOX INJECTION N/A 09/11/2018  ? Procedure: Bladder BOTOX INJECTION;  Surgeon: [name], MD;  Location: [facility];  Service: Urology;  Laterality: N/A;  ? CARDIAC CATHETERIZATION    ? CARDIAC CATHETERIZATION N/A 01/22/2015  ? Procedure: Left Heart Cath;  Surgeon: Corey Skains, MD;  Location: [place] CV LAB;  Service: Cardiovascular;  Laterality: N/A;  ? CARDIAC CATHETERIZATION N/A 01/22/2015  ? Procedure: Coronary Stent Intervention;  Surgeon: Isaias Cowman, MD;  Location: [place] CV LAB;  Service: Cardiovascular;  Laterality: N/A;  ? CATARACT EXTRACTION, BILATERAL    ? COLONOSCOPY WITH PROPOFOL N/A 12/08/2015  ? Procedure: COLONOSCOPY WITH PROPOFOL;  Surgeon: Lollie Sails, MD;  Location: [facility] ENDOSCOPY;  Service: Endoscopy;  Laterality: N/A;  ? COLONOSCOPY WITH PROPOFOL N/A 12/09/2015  ? Procedure: COLONOSCOPY WITH PROPOFOL;  Surgeon: Lollie Sails, MD;  Location: [facility]COPY;  Service: Endoscopy;  Laterality: N/A;  ? CORONARY ANGIOPLASTY    ? 5 stents  ? CORONARY STENT PLACEMENT  2015  ? x5  ? CYSTOSCOPY N/A 09/11/2018  ? Procedure: CYSTOSCOPY;  Surgeon: [name], MD;  Location: [facility]S;  Service: Urology;  Laterality: N/A;  ? CYSTOSCOPY WITH BIOPSY N/A 12/21/2017  ? Procedure: CYSTOSCOPY WITH BIOPSY;  Surgeon: [name], MD;  Location: [facility] ORS;  Service: Urology;  Laterality: N/A;  ? ESOPHAGOGASTRODUODENOSCOPY (EGD) WITH PROPOFOL N/A 12/08/2015  ? Procedure: ESOPHAGOGASTRODUODENOSCOPY (EGD) WITH PROPOFOL;   Surgeon: Lollie Sails, MD;  Location: [facility] ENDOSCOPY;  Service: Endoscopy;  Laterality: N/A;  ? ESOPHAGOGASTRODUODENOSCOPY (EGD) WITH PROPOFOL N/A 12/13/2017  ? Procedure: ESOPHAGOGASTRODUODENOSCOPY (EGD) WITH PROPOFOL;  Surgeon: Lollie Sails, MD;  Location: [facility] ENDOSCOPY;  Service: Endoscopy;  Laterality: N/A;  ? EYE SURGERY    ? HERNIA REPAIR    ? kidney tumor remove    ? TRANSURETHRAL RESECTION OF BLADDER TUMOR WITH GYRUS (TURBT-GYRUS)  12/2013  ? UMBILICAL HERNIA REPAIR    ? urethral meatotomy    ? ? ?There were no vitals filed for this visit. ? ? Subjective Assessment - 06/01/21 1155   ? ? Subjective Patient reports he is having "a really good day and no pain   ? Patient is accompained by: Family member   ? Pertinent History Patient is a 76 year old male with referral for Physical Therapy with diagnosis of Parkinsons disease and history of falling. He reports he diagnosed last year with Parkinsons and has improved his tremors with use of medication but reports worsening shuffling and difficulty with mobility. Patient has past medical history signifiant for Parkinsons; Arthritis, bladder cancer, Trigemic Neuralgia. He lives with wife in 1 level home and current using cane with reported recent fall.   ? Limitations Walking;Lifting;Standing;House hold activities   ? How long can you sit comfortably? No limits   ? How long can you stand comfortably? 15 min- limited due to back pain and foot numbess   ? How long can you walk comfortably? about 80 feet   ? Patient Stated Goals Improve my balance, be able to get up off the floor so I can maybe work in my garage again.   ? Currently in Pain? No/denies   ? ?  ?  ? ?  ? ? ? ?Interventions: ? ? ?Therex:  ?Nustep: L2 x 6 min with LE only (VC to keep SPM > 50)  - Patient reported as moderate ? ?Neuromuscular re-ed: ? ?Staggered standing on airex pad(s) - 1 in front of other x 30 sec then switch feet x 2 sets- Much improved on 2nd set. Patient reports as hard  with intermittent reaching for furniture.  ? ?Static stand on airex pad with feet narrowed x 30 sec x 2 sets - Patient reports as medium and presents with unsteadiness- reaching for UE support-  ? ?Lumbar- Rot- Standing with feet staggered and arms out wide- Turn to right and clap hands then back to baseline and next to left x 15 reps each. VC for wide arms and directional cues.  ? ?ladder drill in // bars x 10 length of bars - down and back - Forward stepping into each square with light UE support x 10 ? ? ?Side step in ladder drill- Patient required VC  to take a wider step x length of bars and back x 5  ? ? ? ?Walk to bathroom with 4WW and then another 100 feet using 4WW focusing on heel to toe gait sequencing and erect posture. Patient required only minimal VC for increased step length.  ? ?Education provided throughout session via VC/TC and demonstration to facilitate movement at target joints and correct muscle activation for all testing and exercises performed.  ? ? ? ? ? ? ? ? ? ? ? ? ? ? ? ? ? ? ? PT Education - 06/01/21 1246   ? ? Education Details Exercise Technique   ? Person(s) Educated Patient   ? Methods Explanation;Demonstration;Tactile cues;Verbal cues   ? Comprehension Verbalized understanding;Returned demonstration;Verbal cues required;Tactile cues required;Need further instruction   ? ?  ?  ? ?  ? ? ? PT Short Term Goals - 05/11/21 0812   ? ?  ? PT SHORT TERM GOAL #1  ? Title Pt will be independent with INITIAL  HEP in order to improve strength and balance in order to decrease fall risk and improve function at home and work.   ? Baseline 04/07/2021= No formal HEP in place. 05/11/2021= Patient reports doing pretty good- compliant with walking program and his stretching/strengthening.   ? Time 6   ? Period Weeks   ? Status Achieved   ? Target Date 05/19/21   ?  ? PT SHORT TERM GOAL #2  ? Title Pt will decrease 5TSTS by at least 3 seconds in order to demonstrate clinically significant improvement in LE  strength.   ? Baseline 04/07/2021= 25 sec; 05/11/2021= 18.65 sec   ? Time 6   ? Period Weeks   ? Status Achieved   ? Target Date 06/30/21   ? ?  ?  ? ?  ? ? ? ? PT Long Term Goals - 05/11/21 0820   ? ?  ? PT LONG

## 2021-06-03 ENCOUNTER — Ambulatory Visit: Payer: Medicare HMO

## 2021-06-03 DIAGNOSIS — M6281 Muscle weakness (generalized): Secondary | ICD-10-CM

## 2021-06-03 DIAGNOSIS — R2681 Unsteadiness on feet: Secondary | ICD-10-CM

## 2021-06-03 DIAGNOSIS — R269 Unspecified abnormalities of gait and mobility: Secondary | ICD-10-CM

## 2021-06-03 DIAGNOSIS — R262 Difficulty in walking, not elsewhere classified: Secondary | ICD-10-CM

## 2021-06-03 DIAGNOSIS — R2689 Other abnormalities of gait and mobility: Secondary | ICD-10-CM

## 2021-06-03 DIAGNOSIS — R278 Other lack of coordination: Secondary | ICD-10-CM

## 2021-06-03 NOTE — Therapy (Signed)
[place] [facility] ?[place], [place], [address] ?Phone: [phone]   Fax:  [phone] ? ?Physical Therapy Treatment ? ?Patient Details  ?Name: [name] ?MRN: [id number] ?Date of Birth: [birth year] ?Referring Provider (PT): Dr. Gurney Maxin ? ? ?Encounter Date: 06/03/2021 ? ? PT End of Session - 06/03/21 0935   ? ? Visit Number 19   ? Number of Visits 25   ? Date for PT Re-Evaluation 06/30/21   ? Authorization Time Period Initial Certification= 06/04/2701- 06/30/2021; PN on 05/11/2021   ? Progress Note Due on Visit 20   ? PT Start Time 0930   ? PT Stop Time 1013   ? PT Time Calculation (min) 43 min   ? Equipment Utilized During Treatment Gait belt   ? Activity Tolerance Patient tolerated treatment well   ? Behavior During Therapy [facility] for tasks assessed/performed   ? ?  ?  ? ?  ? ? ?Past Medical History:  ?Diagnosis Date  ? Anxiety   ? Arteriosclerosis of coronary artery 11/16/2011  ? Overview:  Stent 10/2011 stent rca 2015 with collaterals to lad which is chronically occluded   ? Benign enlargement of prostate   ? Benign essential HTN 06/11/2014  ? Benign prostatic hypertrophy without urinary obstruction 07/31/2014  ? Bilateral cataracts 05/16/2013  ? Overview:  [facility]    ? Bone spur of foot   ? Left  ? BP (high blood pressure) 11/09/2012  ? Cancer [facility])   ? skin (forehead) and bladder  ? Carotid artery narrowing 02/08/2014  ? Depression   ? Detrusor hypertrophy   ? Diabetes ([place])   ? Diabetes mellitus, type 2 ([place]) 12/12/2012  ? Diverticulosis   ? Dyspnea   ? Esophageal reflux   ? Esophageal reflux   ? Fothergill's neuralgia 08/07/2012  ? Overview:  [facility]   ? Gastritis   ? GERD (gastroesophageal reflux disease)   ? Headache   ? cluster headaches  ? 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 Hearing loss in left ear   ? Heart disease   ? Hematuria   ? Hemorrhoids   ? History of hiatal hernia 12/14/2017  ? small   ? Hypercholesteremia   ?  Lesion of bladder   ? Myocardial infarct [facility])   ? Presence of stent in coronary artery 11/09/2012  ? Rectal bleeding   ? Trigeminal neuralgia   ? Trigeminal neuralgia   ? Valvular heart disease   ? Vitamin D deficiency   ? ? ?Past Surgical History:  ?Procedure Laterality Date  ? APPENDECTOMY    ? BOTOX INJECTION N/A 12/21/2017  ? Procedure: Bladder BOTOX INJECTION;  Surgeon: [name], MD;  Location: [facility];  Service: Urology;  Laterality: N/A;  ? BOTOX INJECTION N/A 09/11/2018  ? Procedure: Bladder BOTOX INJECTION;  Surgeon: [name], MD;  Location: [facility];  Service: Urology;  Laterality: N/A;  ? CARDIAC CATHETERIZATION    ? CARDIAC CATHETERIZATION N/A 01/22/2015  ? Procedure: Left Heart Cath;  Surgeon: Corey Skains, MD;  Location: [place] CV LAB;  Service: Cardiovascular;  Laterality: N/A;  ? CARDIAC CATHETERIZATION N/A 01/22/2015  ? Procedure: Coronary Stent Intervention;  Surgeon: Isaias Cowman, MD;  Location: [place] CV LAB;  Service: Cardiovascular;  Laterality: N/A;  ? CATARACT EXTRACTION, BILATERAL    ? COLONOSCOPY WITH PROPOFOL N/A 12/08/2015  ? Procedure: COLONOSCOPY WITH PROPOFOL;  Surgeon: Lollie Sails, MD;  Location: [facility] ENDOSCOPY;  Service: Endoscopy;  Laterality: N/A;  ? COLONOSCOPY WITH PROPOFOL N/A 12/09/2015  ? Procedure: COLONOSCOPY WITH PROPOFOL;  Surgeon: Lollie Sails, MD;  Location: [facility] END[facility]COPY;  Service: Endoscopy;  Laterality: N/A;  ? CORONARY ANGIOPLASTY    ? 5 stents  ? CORONARY STENT PLACEMENT  2015  ? x5  ? CYSTOSCOPY N/A 09/11/2018  ? Procedure: CYSTOSCOPY;  Surgeon: [name], MD;  Location: [facility] ORS;  Service: Urology;  Laterality: N/A;  ? CYSTOSCOPY WITH BIOPSY N/A 12/21/2017  ? Procedure: CYSTOSCOPY WITH BIOPSY;  Surgeon: [name], MD;  Location: [facility] ORS;  Service: Urology;  Laterality: N/A;  ? ESOPHAGOGASTRODUODENOSCOPY (EGD) WITH PROPOFOL N/A 12/08/2015  ? Procedure: ESOPHAGOGASTRODUODENOSCOPY (EGD) WITH PROPOFOL;   Surgeon: Lollie Sails, MD;  Location: [facility] ENDOSCOPY;  Service: Endoscopy;  Laterality: N/A;  ? ESOPHAGOGASTRODUODENOSCOPY (EGD) WITH PROPOFOL N/A 12/13/2017  ? Procedure: ESOPHAGOGASTRODUODENOSCOPY (EGD) WITH PROPOFOL;  Surgeon: Lollie Sails, MD;  Location: [facility] ENDOSCOPY;  Service: Endoscopy;  Laterality: N/A;  ? EYE SURGERY    ? HERNIA REPAIR    ? kidney tumor remove    ? TRANSURETHRAL RESECTION OF BLADDER TUMOR WITH GYRUS (TURBT-GYRUS)  12/2013  ? UMBILICAL HERNIA REPAIR    ? urethral meatotomy    ? ? ?There were no vitals filed for this visit. ? ? Subjective Assessment - 06/03/21 0928   ? ? Subjective Patient having good and bad days. Reports last time in PT was a really good day but the following day he could barely move his legs. Today reports doing well and no back pain today.   ? Patient is accompained by: Family member   ? Pertinent History Patient is a 76 year old male with referral for Physical Therapy with diagnosis of Parkinsons disease and history of falling. He reports he diagnosed last year with Parkinsons and has improved his tremors with use of medication but reports worsening shuffling and difficulty with mobility. Patient has past medical history signifiant for Parkinsons; Arthritis, bladder cancer, Trigemic Neuralgia. He lives with wife in 1 level home and current using cane with reported recent fall.   ? Limitations Walking;Lifting;Standing;House hold activities   ? How long can you sit comfortably? No limits   ? How long can you stand comfortably? 15 min- limited due to back pain and foot numbess   ? How long can you walk comfortably? about 80 feet   ? Patient Stated Goals Improve my balance, be able to get up off the floor so I can maybe work in my garage again.   ? Currently in Pain? No/denies   ? ?  ?  ? ?  ? ?INTERVENTIONS:  ? ? ? ?Nustep- L2 x 6 min with LE only-VC to maintain > 50 SPM ? ?Retro gait with 3# AW - in // bars- focusing on step through and increasing step length x  length of bars x 10. Patient exhibited some shuffling initially but improved with VC.  ? ? ?Step march= GTB Tied across bars- Patient lifted his knee to bar height with GTB resistance  2 sets x 15 reps each LE with UE support.  ? ?B hip abd with GTB- (tied band from one bar to the other) - 2 sets of 15 reps ? ?Hip ext- standing BLE  2 sets x 15 reps ? ?Forward walk in // bars with 3# AW- focusing on increasing step length and not shuffle- Length of bars and back x 10. Patient did improve with increased reps. ? ?Gait in  clinic- without an AD x 60 feet focusing on increasing step length to more reciprocal steps and increased arm sway. Patient required CGA yet no LOB- he was to improve his step length with just VC today.  ? ? ? ? ? ? ? ? ? ? ? ? ? ? ? ? ? ? ? ? ? ? ? ? PT Education - 06/03/21 0931   ? ? Education Details Exercise technique   ? Person(s) Educated Patient   ? Methods Explanation;Demonstration;Tactile cues;Verbal cues   ? Comprehension Verbalized understanding;Returned demonstration;Verbal cues required;Tactile cues required;Need further instruction   ? ?  ?  ? ?  ? ? ? PT Short Term Goals - 05/11/21 0812   ? ?  ? PT SHORT TERM GOAL #1  ? Title Pt will be independent with INITIAL  HEP in order to improve strength and balance in order to decrease fall risk and improve function at home and work.   ? Baseline 04/07/2021= No formal HEP in place. 05/11/2021= Patient reports doing pretty good- compliant with walking program and his stretching/strengthening.   ? Time 6   ? Period Weeks   ? Status Achieved   ? Target Date 05/19/21   ?  ? PT SHORT TERM GOAL #2  ? Title Pt will decrease 5TSTS by at least 3 seconds in order to demonstrate clinically significant improvement in LE strength.   ? Baseline 04/07/2021= 25 sec; 05/11/2021= 18.65 sec   ? Time 6   ? Period Weeks   ? Status Achieved   ? Target Date 06/30/21   ? ?  ?  ? ?  ? ? ? ? PT Long Term Goals - 05/11/21 0820   ? ?  ? PT LONG TERM GOAL #1  ? Title Pt will be  independent with FINAL HEP in order to improve strength and balance in order to decrease fall risk and improve function at home and work.   ? Baseline 04/07/2021= No formal HEP in place.   ? Time 12   ? Period

## 2021-06-08 ENCOUNTER — Ambulatory Visit: Payer: Medicare HMO

## 2021-06-08 DIAGNOSIS — R2681 Unsteadiness on feet: Secondary | ICD-10-CM

## 2021-06-08 DIAGNOSIS — R278 Other lack of coordination: Secondary | ICD-10-CM

## 2021-06-08 DIAGNOSIS — R269 Unspecified abnormalities of gait and mobility: Secondary | ICD-10-CM

## 2021-06-08 DIAGNOSIS — M6281 Muscle weakness (generalized): Secondary | ICD-10-CM

## 2021-06-08 DIAGNOSIS — R262 Difficulty in walking, not elsewhere classified: Secondary | ICD-10-CM

## 2021-06-08 DIAGNOSIS — R2689 Other abnormalities of gait and mobility: Secondary | ICD-10-CM

## 2021-06-08 NOTE — Therapy (Signed)
[place] [facility] ?[place], [place], [address] ?Phone: [phone]   Fax:  [phone] ? ?Physical Therapy Treatment/Physical Therapy Progress Note ? ? ?Dates of reporting period  05/11/2021  to   06/08/2021 ? ?Patient Details  ?Name: [name] ?MRN: [id number] ?Date of Birth: [birth year] ?Referring Provider (PT): Dr. Gurney Maxin ? ? ?Encounter Date: 06/08/2021 ? ? PT End of Session - 06/08/21 0929   ? ? Visit Number 20   ? Number of Visits 25   ? Date for PT Re-Evaluation 06/30/21   ? Authorization Time Period Initial Certification= 10/31/6943- 06/30/2021; PN on 05/11/2021   ? Progress Note Due on Visit 20   ? PT Start Time [phone]   ? PT Stop Time 1014   ? PT Time Calculation (min) 45 min   ? Equipment Utilized During Treatment Gait belt   ? Activity Tolerance Patient tolerated treatment well   ? Behavior During Therapy [facility] for tasks assessed/performed   ? ?  ?  ? ?  ? ? ?Past Medical History:  ?Diagnosis Date  ? Anxiety   ? Arteriosclerosis of coronary artery 11/16/2011  ? Overview:  Stent 10/2011 stent rca 2015 with collaterals to lad which is chronically occluded   ? Benign enlargement of prostate   ? Benign essential HTN 06/11/2014  ? Benign prostatic hypertrophy without urinary obstruction 07/31/2014  ? Bilateral cataracts 05/16/2013  ? Overview:  Dr. [facility]    ? Bone spur of foot   ? Left  ? BP (high blood pressure) 11/09/2012  ? Cancer [facility])   ? skin (forehead) and bladder  ? Carotid artery narrowing 02/08/2014  ? Depression   ? Detrusor hypertrophy   ? Diabetes ([place])   ? Diabetes mellitus, type 2 ([place]) 12/12/2012  ? Diverticulosis   ? Dyspnea   ? Esophageal reflux   ? Esophageal reflux   ? Fothergill's neuralgia 08/07/2012  ? Overview:  [facility]   ? Gastritis   ? GERD (gastroesophageal reflux disease)   ? Headache   ? cluster headaches  ? 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 Hearing loss in left ear   ? Heart disease   ? Hematuria   ?  Hemorrhoids   ? History of hiatal hernia 12/14/2017  ? small   ? Hypercholesteremia   ? Lesion of bladder   ? Myocardial infarct [facility])   ? Presence of stent in coronary artery 11/09/2012  ? Rectal bleeding   ? Trigeminal neuralgia   ? Trigeminal neuralgia   ? Valvular heart disease   ? Vitamin D deficiency   ? ? ?Past Surgical History:  ?Procedure Laterality Date  ? APPENDECTOMY    ? BOTOX INJECTION N/A 12/21/2017  ? Procedure: Bladder BOTOX INJECTION;  Surgeon: [name], MD;  Location: [facility] ORS;  Service: Urology;  Laterality: N/A;  ? BOTOX INJECTION N/A 09/11/2018  ? Procedure: Bladder BOTOX INJECTION;  Surgeon: [name], MD;  Location: [facility] ORS;  Service: Urology;  Laterality: N/A;  ? CARDIAC CATHETERIZATION    ? CARDIAC CATHETERIZATION N/A 01/22/2015  ? Procedure: Left Heart Cath;  Surgeon: Corey Skains, MD;  Location: [place] CV LAB;  Service: Cardiovascular;  Laterality: N/A;  ? CARDIAC CATHETERIZATION N/A 01/22/2015  ? Procedure: Coronary Stent Intervention;  Surgeon: Isaias Cowman, MD;  Location: [place] CV LAB;  Service: Cardiovascular;  Laterality: N/A;  ? CATARACT EXTRACTION, BILATERAL    ? COLONOSCOPY WITH PROPOFOL N/A 12/08/2015  ?  Procedure: COLONOSCOPY WITH PROPOFOL;  Surgeon: Lollie Sails, MD;  Location: [facility] ENDOSCOPY;  Service: Endoscopy;  Laterality: N/A;  ? COLONOSCOPY WITH PROPOFOL N/A 12/09/2015  ? Procedure: COLONOSCOPY WITH PROPOFOL;  Surgeon: Lollie Sails, MD;  Location: [facility] ENDOSCOPY;  Service: Endoscopy;  Laterality: N/A;  ? CORONARY ANGIOPLASTY    ? 5 stents  ? CORONARY STENT PLACEMENT  2015  ? x5  ? CYSTOSCOPY N/A 09/11/2018  ? Procedure: CYSTOSCOPY;  Surgeon: [name], MD;  Location: [facility] ORS;  Service: Urology;  Laterality: N/A;  ? CYSTOSCOPY WITH BIOPSY N/A 12/21/2017  ? Procedure: CYSTOSCOPY WITH BIOPSY;  Surgeon: [name], MD;  Location: [facility] ORS;  Service: Urology;  Laterality: N/A;  ? ESOPHAGOGASTRODUODENOSCOPY (EGD) WITH  PROPOFOL N/A 12/08/2015  ? Procedure: ESOPHAGOGASTRODUODENOSCOPY (EGD) WITH PROPOFOL;  Surgeon: Lollie Sails, MD;  Location: [facility]COPY;  Service: Endoscopy;  Laterality: N/A;  ? ESOPHAGOGASTRODUODENOSCOPY (EGD) WITH PROPOFOL N/A 12/13/2017  ? Procedure: ESOPHAGOGASTRODUODENOSCOPY (EGD) WITH PROPOFOL;  Surgeon: Lollie Sails, MD;  Location: [facility] ENDOSCOPY;  Service: Endoscopy;  Laterality: N/A;  ? EYE SURGERY    ? HERNIA REPAIR    ? kidney tumor remove    ? TRANSURETHRAL RESECTION OF BLADDER TUMOR WITH GYRUS (TURBT-GYRUS)  12/2013  ? UMBILICAL HERNIA REPAIR    ? urethral meatotomy    ? ? ?There were no vitals filed for this visit. ? ? Subjective Assessment - 06/08/21 0927   ? ? Subjective Patient reports his back is okay today but was bothering him over the weekend.   ? Patient is accompained by: Family member   ? Pertinent History Patient is a 76 year old male with referral for Physical Therapy with diagnosis of Parkinsons disease and history of falling. He reports he diagnosed last year with Parkinsons and has improved his tremors with use of medication but reports worsening shuffling and difficulty with mobility. Patient has past medical history signifiant for Parkinsons; Arthritis, bladder cancer, Trigemic Neuralgia. He lives with wife in 1 level home and current using cane with reported recent fall.   ? Limitations Walking;Lifting;Standing;House hold activities   ? How long can you sit comfortably? No limits   ? How long can you stand comfortably? 15 min- limited due to back pain and foot numbess   ? How long can you walk comfortably? about 80 feet   ? Patient Stated Goals Improve my balance, be able to get up off the floor so I can maybe work in my garage again.   ? Currently in Pain? No/denies   ? ?  ?  ? ?  ? ? ? ?INTERVENTIONS:  ? ?2 min step tap onto 6 " block- with BUE Support- Patient reported as "Challenging"  ? ? ? ?Reassessed LTGs for progress note: ? ?5x STS = 13.76 sec without UE  support ? ?10 MWT without Walker = 15.49 sec = 0.65 m/s ?10 MWT  with walker= 13.33 sec; 12.01= 0.79 m/s avg ? ?TUG with walker: 1) 21.47 sec with 4WW 2) 21.90 sec = 21.69 sec avg ?TUG without a device  1) 18.35 sec 2) 17.81 sec = 18.08 sec avg ? ? ?6 Min walk= 600 feet using 4WW - stopped at 5 min- due to fatigue ? ? ?  ? ? ? ? ? ? ? ? ? PT Education - 06/08/21 0927   ? ? Education Details Purpose of functional outcome measures and plan of care   ? Person(s) Educated Patient   ? Methods Explanation;Demonstration;Tactile  cues;Verbal cues   ? Comprehension Verbalized understanding;Returned demonstration;Verbal cues required;Tactile cues required;Need further instruction   ? ?  ?  ? ?  ? ? ? PT Short Term Goals - 05/11/21 0812   ? ?  ? PT SHORT TERM GOAL #1  ? Title Pt will be independent with INITIAL  HEP in order to improve strength and balance in order to decrease fall risk and improve function at home and work.   ? Baseline 04/07/2021= No formal HEP in place. 05/11/2021= Patient reports doing pretty good- compliant with walking program and his stretching/strengthening.   ? Time 6   ? Period Weeks   ? Status Achieved   ? Target Date 05/19/21   ?  ? PT SHORT TERM GOAL #2  ? Title Pt will decrease 5TSTS by at least 3 seconds in order to demonstrate clinically significant improvement in LE strength.   ? Baseline 04/07/2021= 25 sec; 05/11/2021= 18.65 sec   ? Time 6   ? Period Weeks   ? Status Achieved   ? Target Date 06/30/21   ? ?  ?  ? ?  ? ? ? ? PT Long Term Goals - 06/08/21 0948   ? ?  ? PT LONG TERM GOAL #1  ? Title Pt will be independent with FINAL HEP in order to improve strength and balance in order to decrease fall risk and improve function at home and work.   ? Baseline 04/07/2021= No formal HEP in place.   ? Time 12   ? Period Weeks   ? Status On-going   ? Target Date 06/30/21   ?  ? PT LONG TERM GOAL #2  ? Title Pt will improve FOTO to target score of 45% to display perceived improvements in ability to complete  ADL's.   ? Baseline 2/7/22023= 41%; FOTO=53%   ? Time 12   ? Period Weeks   ? Status Achieved   ? Target Date 06/30/21   ?  ? PT LONG TERM GOAL #3  ? Title Pt will decrease 5TSTS by at least 5 seconds in order to

## 2021-06-10 ENCOUNTER — Ambulatory Visit: Payer: Medicare HMO

## 2021-06-10 DIAGNOSIS — R262 Difficulty in walking, not elsewhere classified: Secondary | ICD-10-CM

## 2021-06-10 DIAGNOSIS — M6281 Muscle weakness (generalized): Secondary | ICD-10-CM

## 2021-06-10 DIAGNOSIS — R2681 Unsteadiness on feet: Secondary | ICD-10-CM

## 2021-06-10 DIAGNOSIS — R269 Unspecified abnormalities of gait and mobility: Secondary | ICD-10-CM | POA: Diagnosis not present

## 2021-06-10 NOTE — Therapy (Signed)
[place] ?[facility] ?[place], [place], [address] ?Phone: [phone]   Fax:  [phone] ? ?Physical Therapy Treatment ? ?Patient Details  ?Name: [name] ?MRN: [id number] ?Date of Birth: 26-May-1945 ?Referring Provider (PT): Dr. Gurney Maxin ? ? ?Encounter Date: 06/10/2021 ? ? ? ?Past Medical History:  ?Diagnosis Date  ? Anxiety   ? Arteriosclerosis of coronary artery 11/16/2011  ? Overview:  Stent 10/2011 stent rca 2015 with collaterals to lad which is chronically occluded   ? Benign enlargement of prostate   ? Benign essential HTN 06/11/2014  ? Benign prostatic hypertrophy without urinary obstruction 07/31/2014  ? Bilateral cataracts 05/16/2013  ? Overview:  Dr. [facility]    ? Bone spur of foot   ? Left  ? BP (high blood pressure) 11/09/2012  ? Cancer [facility])   ? skin (forehead) and bladder  ? Carotid artery narrowing 02/08/2014  ? Depression   ? Detrusor hypertrophy   ? Diabetes ([place])   ? Diabetes mellitus, type 2 ([place]) 12/12/2012  ? Diverticulosis   ? Dyspnea   ? Esophageal reflux   ? Esophageal reflux   ? Fothergill's neuralgia 08/07/2012  ? Overview:  [facility]   ? Gastritis   ? GERD (gastroesophageal reflux disease)   ? Headache   ? cluster headaches  ? 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 Hearing loss in left ear   ? Heart disease   ? Hematuria   ? Hemorrhoids   ? History of hiatal hernia 12/14/2017  ? small   ? Hypercholesteremia   ? Lesion of bladder   ? Myocardial infarct [facility])   ? Presence of stent in coronary artery 11/09/2012  ? Rectal bleeding   ? Trigeminal neuralgia   ? Trigeminal neuralgia   ? Valvular heart disease   ? Vitamin D deficiency   ? ? ?Past Surgical History:  ?Procedure Laterality Date  ? APPENDECTOMY    ? BOTOX INJECTION N/A 12/21/2017  ? Procedure: Bladder BOTOX INJECTION;  Surgeon: [name], MD;  Location: [facility] ORS;  Service: Urology;  Laterality: N/A;  ? BOTOX INJECTION N/A 09/11/2018  ? Procedure:  Bladder BOTOX INJECTION;  Surgeon: [name], MD;  Location: [facility] ORS;  Service: Urology;  Laterality: N/A;  ? CARDIAC CATHETERIZATION    ? CARDIAC CATHETERIZATION N/A 01/22/2015  ? Procedure: Left Heart Cath;  Surgeon: Corey Skains, MD;  Location: [place] CV LAB;  Service: Cardiovascular;  Laterality: N/A;  ? CARDIAC CATHETERIZATION N/A 01/22/2015  ? Procedure: Coronary Stent Intervention;  Surgeon: Isaias Cowman, MD;  Location: [place] CV LAB;  Service: Cardiovascular;  Laterality: N/A;  ? CATARACT EXTRACTION, BILATERAL    ? COLONOSCOPY WITH PROPOFOL N/A 12/08/2015  ? Procedure: COLONOSCOPY WITH PROPOFOL;  Surgeon: Lollie Sails, MD;  Location: [facility] ENDOSCOPY;  Service: Endoscopy;  Laterality: N/A;  ? COLONOSCOPY WITH PROPOFOL N/A 12/09/2015  ? Procedure: COLONOSCOPY WITH PROPOFOL;  Surgeon: Lollie Sails, MD;  Location: [facility] ENDOSCOPY;  Service: Endoscopy;  Laterality: N/A;  ? CORONARY ANGIOPLASTY    ? 5 stents  ? CORONARY STENT PLACEMENT  2015  ? x5  ? CYSTOSCOPY N/A 09/11/2018  ? Procedure: CYSTOSCOPY;  Surgeon: [name], MD;  Location: [facility] ORS;  Service: Urology;  Laterality: N/A;  ? CYSTOSCOPY WITH BIOPSY N/A 12/21/2017  ? Procedure: CYSTOSCOPY WITH BIOPSY;  Surgeon: [name], MD;  Location: [facility] ORS;  Service: Urology;  Laterality: N/A;  ? ESOPHAGOGASTRODUODENOSCOPY (EGD) WITH PROPOFOL  N/A 12/08/2015  ? Procedure: ESOPHAGOGASTRODUODENOSCOPY (EGD) WITH PROPOFOL;  Surgeon: Lollie Sails, MD;  Location: [facility] ENDOSCOPY;  Service: Endoscopy;  Laterality: N/A;  ? ESOPHAGOGASTRODUODENOSCOPY (EGD) WITH PROPOFOL N/A 12/13/2017  ? Procedure: ESOPHAGOGASTRODUODENOSCOPY (EGD) WITH PROPOFOL;  Surgeon: Lollie Sails, MD;  Location: [facility] ENDOSCOPY;  Service: Endoscopy;  Laterality: N/A;  ? EYE SURGERY    ? HERNIA REPAIR    ? kidney tumor remove    ? TRANSURETHRAL RESECTION OF BLADDER TUMOR WITH GYRUS (TURBT-GYRUS)  12/2013  ? UMBILICAL HERNIA REPAIR    ? urethral  meatotomy    ? ? ?There were no vitals filed for this visit. ? ? Subjective Assessment - 06/10/21 0942   ? ? Subjective Patient reports feeling weak today but overall doing well with no pain today.   ? Patient is accompained by: Family member   ? Pertinent History Patient is a 76 year old male with referral for Physical Therapy with diagnosis of Parkinsons disease and history of falling. He reports he diagnosed last year with Parkinsons and has improved his tremors with use of medication but reports worsening shuffling and difficulty with mobility. Patient has past medical history signifiant for Parkinsons; Arthritis, bladder cancer, Trigemic Neuralgia. He lives with wife in 1 level home and current using cane with reported recent fall.   ? Limitations Walking;Lifting;Standing;House hold activities   ? How long can you sit comfortably? No limits   ? How long can you stand comfortably? 15 min- limited due to back pain and foot numbess   ? How long can you walk comfortably? about 80 feet   ? Patient Stated Goals Improve my balance, be able to get up off the floor so I can maybe work in my garage again.   ? Currently in Pain? No/denies   ? ?  ?  ? ?  ? ?INTERVENTIONS: ? ?Nustep: Progressive Resistance for LE strengthening  ?L1= 1 min ?L2 = 1 min ?L3= 1 min ?L4= 1 min ?L5= 1 min ?Patient able to maintain > 50 SPM  ? ? ?Step up onto 6 in block with 3lb AW x 20 reps alt LE with BUE Support - Patient able to clear steps today without significant difficulty.  ? ?Staggered stand (44fot on airex pad/ 170ft on step) x 30 sec hold x 3 each.  ? ? ?Forward/retro gait- 3lb AW in // bars - focusing on reciprocal and increased step length-  ?- With BUE support down and back x5 - approx 8 steps each way with good reciprocal steps.  ?-Without UE support x 10 reps total- Approx 10 steps forward and 11-12 steps backward.  ? ?Side Step with turn - working his way down and back in // bars with 3lb AW x 10 each way- focusing on wide step  length and no UE support- No LOB.  ? ?Education provided throughout session via VC/TC and demonstration to facilitate movement at target joints and correct muscle activation for all testing and exercises performed.  ? ? ? ? ? ? ? ? ? ? ? ? ? ? ? ? ? ? ? PT Education - 06/10/21 1138   ? ? Education Details Balance exercises   ? Person(s) Educated Patient   ? Methods Explanation;Demonstration;Tactile cues;Verbal cues   ? Comprehension Verbalized understanding;Returned demonstration;Verbal cues required;Tactile cues required;Need further instruction   ? ?  ?  ? ?  ? ? ? PT Short Term Goals - 05/11/21 0812   ? ?  ? PT SHORT  TERM GOAL #1  ? Title Pt will be independent with INITIAL  HEP in order to improve strength and balance in order to decrease fall risk and improve function at home and work.   ? Baseline 04/07/2021= No formal HEP in place. 05/11/2021= Patient reports doing pretty good- compliant with walking program and his stretching/strengthening.   ? Time 6   ? Period Weeks   ? Status Achieved   ? Target Date 05/19/21   ?  ? PT SHORT TERM GOAL #2  ? Title Pt will decrease 5TSTS by at least 3 seconds in order to demonstrate clinically significant improvement in LE strength.   ? Baseline 04/07/2021= 25 sec; 05/11/2021= 18.65 sec   ? Time 6   ? Period Weeks   ? Status Achieved   ? Target Date 06/30/21   ? ?  ?  ? ?  ? ? ? ? PT Long Term Goals - 06/08/21 0948   ? ?  ? PT LONG TERM GOAL #1  ? Title Pt will be independent with FINAL HEP in order to improve strength and balance in order to decrease fall risk and improve function at home and work.   ? Baseline 04/07/2021= No formal HEP in place.   ? Time 12   ? Period Weeks   ? Status On-going   ? Target Date 06/30/21   ?  ? PT LONG TERM GOAL #2  ? Title Pt will improve FOTO to target score of 45% to display perceived improvements in ability to complete ADL's.   ? Baseline 2/7/22023= 41%; FOTO=53%   ? Time 12   ? Period Weeks   ? Status Achieved   ? Target Date 06/30/21   ?  ? PT  LONG TERM GOAL #3  ? Title Pt will decrease 5TSTS by at least 5 seconds in order to demonstrate clinically significant improvement in LE strength.   ? Baseline 04/07/2021= 25 sec; 05/11/2021= 18.65 sec without UE

## 2021-06-15 ENCOUNTER — Ambulatory Visit: Payer: Medicare HMO

## 2021-06-15 DIAGNOSIS — R269 Unspecified abnormalities of gait and mobility: Secondary | ICD-10-CM | POA: Diagnosis not present

## 2021-06-15 DIAGNOSIS — R262 Difficulty in walking, not elsewhere classified: Secondary | ICD-10-CM

## 2021-06-15 DIAGNOSIS — M6281 Muscle weakness (generalized): Secondary | ICD-10-CM

## 2021-06-15 DIAGNOSIS — M545 Low back pain, unspecified: Secondary | ICD-10-CM

## 2021-06-15 DIAGNOSIS — R2681 Unsteadiness on feet: Secondary | ICD-10-CM

## 2021-06-15 DIAGNOSIS — R2689 Other abnormalities of gait and mobility: Secondary | ICD-10-CM

## 2021-06-15 DIAGNOSIS — R278 Other lack of coordination: Secondary | ICD-10-CM

## 2021-06-15 NOTE — Therapy (Signed)
[place] [facility] ?[place], [place], [address] ?Phone: [phone]   Fax:  [phone] ? ?Physical Therapy Treatment ? ?Patient Details  ?Name: [name] ?MRN: [id number] ?Date of Birth: Aug 10, 1945 ?Referring Provider (PT): Dr. Gurney Maxin ? ? ?Encounter Date: 06/15/2021 ? ? PT End of Session - 06/15/21 0935   ? ? Visit Number 22   ? Number of Visits 25   ? Date for PT Re-Evaluation 06/30/21   ? Authorization Time Period Initial Certification= 09/30/4479- 06/30/2021; PN on 05/11/2021   ? Progress Note Due on Visit 20   ? PT Start Time 0930   ? PT Stop Time 1011   ? PT Time Calculation (min) 41 min   ? Equipment Utilized During Treatment Gait belt   ? Activity Tolerance Patient tolerated treatment well   ? Behavior During Therapy [facility] for tasks assessed/performed   ? ?  ?  ? ?  ? ? ?Past Medical History:  ?Diagnosis Date  ? Anxiety   ? Arteriosclerosis of coronary artery 11/16/2011  ? Overview:  Stent 10/2011 stent rca 2015 with collaterals to lad which is chronically occluded   ? Benign enlargement of prostate   ? Benign essential HTN 06/11/2014  ? Benign prostatic hypertrophy without urinary obstruction 07/31/2014  ? Bilateral cataracts 05/16/2013  ? Overview:  Dr. [facility]    ? Bone spur of foot   ? Left  ? BP (high blood pressure) 11/09/2012  ? Cancer [facility])   ? skin (forehead) and bladder  ? Carotid artery narrowing 02/08/2014  ? Depression   ? Detrusor hypertrophy   ? Diabetes ([place])   ? Diabetes mellitus, type 2 ([place]) 12/12/2012  ? Diverticulosis   ? Dyspnea   ? Esophageal reflux   ? Esophageal reflux   ? Fothergill's neuralgia 08/07/2012  ? Overview:  [facility]   ? Gastritis   ? GERD (gastroesophageal reflux disease)   ? Headache   ? cluster headaches  ? 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 Hearing loss in left ear   ? Heart disease   ? Hematuria   ? Hemorrhoids   ? History of hiatal hernia 12/14/2017  ? small   ? Hypercholesteremia   ?  Lesion of bladder   ? Myocardial infarct [facility])   ? Presence of stent in coronary artery 11/09/2012  ? Rectal bleeding   ? Trigeminal neuralgia   ? Trigeminal neuralgia   ? Valvular heart disease   ? Vitamin D deficiency   ? ? ?Past Surgical History:  ?Procedure Laterality Date  ? APPENDECTOMY    ? BOTOX INJECTION N/A 12/21/2017  ? Procedure: Bladder BOTOX INJECTION;  Surgeon: [name], MD;  Location: [facility] ORS;  Service: Urology;  Laterality: N/A;  ? BOTOX INJECTION N/A 09/11/2018  ? Procedure: Bladder BOTOX INJECTION;  Surgeon: [name], MD;  Location: [facility]S;  Service: Urology;  Laterality: N/A;  ? CARDIAC CATHETERIZATION    ? CARDIAC CATHETERIZATION N/A 01/22/2015  ? Procedure: Left Heart Cath;  Surgeon: Corey Skains, MD;  Location: [place] [facility];  Service: Cardiovascular;  Laterality: N/A;  ? CARDIAC CATHETERIZATION N/A 01/22/2015  ? Procedure: Coronary Stent Intervention;  Surgeon: Isaias Cowman, MD;  Location: [place] [facility];  Service: Cardiovascular;  Laterality: N/A;  ? CATARACT EXTRACTION, BILATERAL    ? COLONOSCOPY WITH PROPOFOL N/A 12/08/2015  ? Procedure: COLONOSCOPY WITH PROPOFOL;  Surgeon: Lollie Sails, MD;  Location: [facility];  Service: Endoscopy;  Laterality: N/A;  ? COLONOSCOPY WITH PROPOFOL N/A 12/09/2015  ? Procedure: COLONOSCOPY WITH PROPOFOL;  Surgeon: Lollie Sails, MD;  Location: [facility] ENDOSCOPY;  Service: Endoscopy;  Laterality: N/A;  ? CORONARY ANGIOPLASTY    ? 5 stents  ? CORONARY STENT PLACEMENT  2015  ? x5  ? CYSTOSCOPY N/A 09/11/2018  ? Procedure: CYSTOSCOPY;  Surgeon: [name], MD;  Location: [facility] ORS;  Service: Urology;  Laterality: N/A;  ? CYSTOSCOPY WITH BIOPSY N/A 12/21/2017  ? Procedure: CYSTOSCOPY WITH BIOPSY;  Surgeon: [name], MD;  Location: [facility] ORS;  Service: Urology;  Laterality: N/A;  ? ESOPHAGOGASTRODUODENOSCOPY (EGD) WITH PROPOFOL N/A 12/08/2015  ? Procedure: ESOPHAGOGASTRODUODENOSCOPY (EGD) WITH PROPOFOL;   Surgeon: Lollie Sails, MD;  Location: [facility] ENDOSCOPY;  Service: Endoscopy;  Laterality: N/A;  ? ESOPHAGOGASTRODUODENOSCOPY (EGD) WITH PROPOFOL N/A 12/13/2017  ? Procedure: ESOPHAGOGASTRODUODENOSCOPY (EGD) WITH PROPOFOL;  Surgeon: Lollie Sails, MD;  Location: [facility] ENDOSCOPY;  Service: Endoscopy;  Laterality: N/A;  ? EYE SURGERY    ? HERNIA REPAIR    ? kidney tumor remove    ? TRANSURETHRAL RESECTION OF BLADDER TUMOR WITH GYRUS (TURBT-GYRUS)  12/2013  ? UMBILICAL HERNIA REPAIR    ? urethral meatotomy    ? ? ?There were no vitals filed for this visit. ? ? Subjective Assessment - 06/15/21 0932   ? ? Subjective Patient reports having a rough day walking on Saturday but states he was using a standard walker.   ? Patient is accompained by: Family member   ? Pertinent History Patient is a 76 year old male with referral for Physical Therapy with diagnosis of Parkinsons disease and history of falling. He reports he diagnosed last year with Parkinsons and has improved his tremors with use of medication but reports worsening shuffling and difficulty with mobility. Patient has past medical history signifiant for Parkinsons; Arthritis, bladder cancer, Trigemic Neuralgia. He lives with wife in 1 level home and current using cane with reported recent fall.   ? Limitations Walking;Lifting;Standing;House hold activities   ? How long can you sit comfortably? No limits   ? How long can you stand comfortably? 15 min- limited due to back pain and foot numbess   ? How long can you walk comfortably? about 80 feet   ? Patient Stated Goals Improve my balance, be able to get up off the floor so I can maybe work in my garage again.   ? Currently in Pain? No/denies   ? ?  ?  ? ?  ? ? ?Nustep: Interval training ? ? ? ?L0= 2 min ?L2 = 2 min ?L1= 1 min ?L4= 1 min ?L5= 1 min ?Patient able to maintain > 50 SPM and reports as Easy ? ?Heel to Toe activity  (GTB around ankle) then prompted to start in toe off position and swing leg forward  into heel strike position x 15 reps each LE.  ? ?Seated LAQ- VC for full ext- 3lb AW x 15 reps BLE.  ? ?Standing step up with BUE and contralateral LE hip march using 3lb AW each LE.  ? ?Sit to stand without UE support from chair x 10 reps. No repetitive VC for leaning forward today.  ? ? ?Standing calf raises with 3lb AW BLE x 15 reps ? ? ?Education provided throughout session via VC/TC and demonstration to facilitate movement at target joints and correct muscle activation for all testing and exercises performed.  ? ? ? ? ? ? ? ? ? ? ? ? ? ? ?  PT Education - 06/15/21 0935   ? ? Education Details Exercise technique   ? Person(s) Educated Patient   ? Methods Explanation;Demonstration;Tactile cues;Verbal cues   ? Comprehension Verbalized understanding;Returned demonstration;Verbal cues required;Tactile cues required;Need further instruction   ? ?  ?  ? ?  ? ? ? PT Short Term Goals - 05/11/21 0812   ? ?  ? PT SHORT TERM GOAL #1  ? Title Pt will be independent with INITIAL  HEP in order to improve strength and balance in order to decrease fall risk and improve function at home and work.   ? Baseline 04/07/2021= No formal HEP in place. 05/11/2021= Patient reports doing pretty good- compliant with walking program and his stretching/strengthening.   ? Time 6   ? Period Weeks   ? Status Achieved   ? Target Date 05/19/21   ?  ? PT SHORT TERM GOAL #2  ? Title Pt will decrease 5TSTS by at least 3 seconds in order to demonstrate clinically significant improvement in LE strength.   ? Baseline 04/07/2021= 25 sec; 05/11/2021= 18.65 sec   ? Time 6   ? Period Weeks   ? Status Achieved   ? Target Date 06/30/21   ? ?  ?  ? ?  ? ? ? ? PT Long Term Goals - 06/08/21 0948   ? ?  ? PT LONG TERM GOAL #1  ? Title Pt will be independent with FINAL HEP in order to improve strength and balance in order to decrease fall risk and improve function at home and work.   ? Baseline 04/07/2021= No formal HEP in place.   ? Time 12   ? Period Weeks   ? Status  On-going   ? Target Date 06/30/21   ?  ? PT LONG TERM GOAL #2  ? Title Pt will improve FOTO to target score of 45% to display perceived improvements in ability to complete ADL's.   ? Baseline 2/7/22023= 41%; F

## 2021-06-17 ENCOUNTER — Ambulatory Visit: Payer: Medicare HMO

## 2021-06-17 DIAGNOSIS — R2689 Other abnormalities of gait and mobility: Secondary | ICD-10-CM

## 2021-06-17 DIAGNOSIS — R2681 Unsteadiness on feet: Secondary | ICD-10-CM

## 2021-06-17 DIAGNOSIS — R269 Unspecified abnormalities of gait and mobility: Secondary | ICD-10-CM

## 2021-06-17 DIAGNOSIS — G8929 Other chronic pain: Secondary | ICD-10-CM

## 2021-06-17 DIAGNOSIS — M6281 Muscle weakness (generalized): Secondary | ICD-10-CM

## 2021-06-17 DIAGNOSIS — R278 Other lack of coordination: Secondary | ICD-10-CM

## 2021-06-17 DIAGNOSIS — R262 Difficulty in walking, not elsewhere classified: Secondary | ICD-10-CM

## 2021-06-17 NOTE — Therapy (Signed)
[place] ?[facility] ?[place], [place], [address] ?Phone: [phone]   Fax:  [phone] ? ?Physical Therapy Treatment ? ?Patient Details  ?Name: [name] ?MRN: [id number] ?Date of Birth: 08/19/1945 ?Referring Provider (PT): Dr. Gurney Maxin ? ? ?Encounter Date: 06/17/2021 ? ? PT End of Session - 06/17/21 0912   ? ? Visit Number 23   ? Number of Visits 25   ? Date for PT Re-Evaluation 06/30/21   ? Authorization Time Period Initial Certification= 11/04/7589- 06/30/2021; PN on 05/11/2021   ? Progress Note Due on Visit 20   ? PT Start Time [phone]   ? PT Stop Time 0940   ? PT Time Calculation (min) 46 min   ? Equipment Utilized During Treatment Gait belt   ? Activity Tolerance Patient tolerated treatment well   ? Behavior During Therapy [facility] for tasks assessed/performed   ? ?  ?  ? ?  ? ? ?Past Medical History:  ?Diagnosis Date  ? Anxiety   ? Arteriosclerosis of coronary artery 11/16/2011  ? Overview:  Stent 10/2011 stent rca 2015 with collaterals to lad which is chronically occluded   ? Benign enlargement of prostate   ? Benign essential HTN 06/11/2014  ? Benign prostatic hypertrophy without urinary obstruction 07/31/2014  ? Bilateral cataracts 05/16/2013  ? Overview:  [facility]    ? Bone spur of foot   ? Left  ? BP (high blood pressure) 11/09/2012  ? Cancer [facility])   ? skin (forehead) and bladder  ? Carotid artery narrowing 02/08/2014  ? Depression   ? Detrusor hypertrophy   ? Diabetes ([place])   ? Diabetes mellitus, type 2 ([place]) 12/12/2012  ? Diverticulosis   ? Dyspnea   ? Esophageal reflux   ? Esophageal reflux   ? Fothergill's neuralgia 08/07/2012  ? Overview:  [facility]   ? Gastritis   ? GERD (gastroesophageal reflux disease)   ? Headache   ? cluster headaches  ? 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 Hearing loss in left ear   ? Heart disease   ? Hematuria   ? Hemorrhoids   ? History of hiatal hernia 12/14/2017  ? small   ? Hypercholesteremia   ?  Lesion of bladder   ? Myocardial infarct [facility])   ? Presence of stent in coronary artery 11/09/2012  ? Rectal bleeding   ? Trigeminal neuralgia   ? Trigeminal neuralgia   ? Valvular heart disease   ? Vitamin D deficiency   ? ? ?Past Surgical History:  ?Procedure Laterality Date  ? APPENDECTOMY    ? BOTOX INJECTION N/A 12/21/2017  ? Procedure: Bladder BOTOX INJECTION;  Surgeon: [name], MD;  Location: [facility]S;  Service: Urology;  Laterality: N/A;  ? BOTOX INJECTION N/A 09/11/2018  ? Procedure: Bladder BOTOX INJECTION;  Surgeon: [name], MD;  Location: [facility];  Service: Urology;  Laterality: N/A;  ? CARDIAC CATHETERIZATION    ? CARDIAC CATHETERIZATION N/A 01/22/2015  ? Procedure: Left Heart Cath;  Surgeon: Corey Skains, MD;  Location: [place] [facility];  Service: Cardiovascular;  Laterality: N/A;  ? CARDIAC CATHETERIZATION N/A 01/22/2015  ? Procedure: Coronary Stent Intervention;  Surgeon: Isaias Cowman, MD;  Location: [place] [facility];  Service: Cardiovascular;  Laterality: N/A;  ? CATARACT EXTRACTION, BILATERAL    ? COLONOSCOPY WITH PROPOFOL N/A 12/08/2015  ? Procedure: COLONOSCOPY WITH PROPOFOL;  Surgeon: Lollie Sails, MD;  Location: [facility] ENDOSCOPY;  Service: Endoscopy;  Laterality: N/A;  ? COLONOSCOPY WITH PROPOFOL N/A 12/09/2015  ? Procedure: COLONOSCOPY WITH PROPOFOL;  Surgeon: Lollie Sails, MD;  Location: [facility] ENDOSCOPY;  Service: Endoscopy;  Laterality: N/A;  ? CORONARY ANGIOPLASTY    ? 5 stents  ? CORONARY STENT PLACEMENT  2015  ? x5  ? CYSTOSCOPY N/A 09/11/2018  ? Procedure: CYSTOSCOPY;  Surgeon: [name], MD;  Location: [facility] ORS;  Service: Urology;  Laterality: N/A;  ? CYSTOSCOPY WITH BIOPSY N/A 12/21/2017  ? Procedure: CYSTOSCOPY WITH BIOPSY;  Surgeon: [name], MD;  Location: [facility] ORS;  Service: Urology;  Laterality: N/A;  ? ESOPHAGOGASTRODUODENOSCOPY (EGD) WITH PROPOFOL N/A 12/08/2015  ? Procedure: ESOPHAGOGASTRODUODENOSCOPY (EGD) WITH PROPOFOL;   Surgeon: Lollie Sails, MD;  Location: [facility] ENDOSCOPY;  Service: Endoscopy;  Laterality: N/A;  ? ESOPHAGOGASTRODUODENOSCOPY (EGD) WITH PROPOFOL N/A 12/13/2017  ? Procedure: ESOPHAGOGASTRODUODENOSCOPY (EGD) WITH PROPOFOL;  Surgeon: Lollie Sails, MD;  Location: [facility] ENDOSCOPY;  Service: Endoscopy;  Laterality: N/A;  ? EYE SURGERY    ? HERNIA REPAIR    ? kidney tumor remove    ? TRANSURETHRAL RESECTION OF BLADDER TUMOR WITH GYRUS (TURBT-GYRUS)  12/2013  ? UMBILICAL HERNIA REPAIR    ? urethral meatotomy    ? ? ?There were no vitals filed for this visit. ? ? Subjective Assessment - 06/17/21 0715   ? ? Subjective Patient reports he mixed up his time and got here way early so he was able to go down to cafeteria and eat some breakfast and reports feels pretty good so far today.   ? Patient is accompained by: Family member   ? Pertinent History Patient is a 76 year old male with referral for Physical Therapy with diagnosis of Parkinsons disease and history of falling. He reports he diagnosed last year with Parkinsons and has improved his tremors with use of medication but reports worsening shuffling and difficulty with mobility. Patient has past medical history signifiant for Parkinsons; Arthritis, bladder cancer, Trigemic Neuralgia. He lives with wife in 1 level home and current using cane with reported recent fall.   ? Limitations Walking;Lifting;Standing;House hold activities   ? How long can you sit comfortably? No limits   ? How long can you stand comfortably? 15 min- limited due to back pain and foot numbess   ? How long can you walk comfortably? about 80 feet   ? Patient Stated Goals Improve my balance, be able to get up off the floor so I can maybe work in my garage again.   ? Currently in Pain? No/denies   ? ?  ?  ? ?  ? ? ? ?Nustep: For LE strengthening and improved cardiovascular response ?L2- 4 min  ?L4- 2 min ?0.19 mi and VC to keep SPM at 50 or higher. No report of pain. Patient did utilize a heating  pad against his back while performing and states made his back feel better and "loose" ? ?Step tap 4lb AW without UE support 2 x 12 reps ? ?Step up 4lb AW without UE support x 15 reps alternating ? ?Standing (1 foot up on step and other back on floor)- trunk twist Left to right with outstretched arms holding onto rainbow ball x 10 reps then switch feet for 10 more reps.  ? ?Standing- Side step to left and reach for sticky note placed on wall x 15 each side. *Patient reports as fatiguing.  ? ? ?Sit to stand x 20 reps without UE Support . ? ?Education  provided throughout session via VC/TC and demonstration to facilitate movement at target joints and correct muscle activation for all testing and exercises performed.  ? ? ? ? ? ? ? ? ? ? ? ? ? ? ? ? ? ? ? ? ? PT Education - 06/18/21 0716   ? ? Education Details Exercise technique   ? Person(s) Educated Patient   ? Methods Explanation;Demonstration;Tactile cues;Verbal cues   ? Comprehension Verbalized understanding;Returned demonstration;Verbal cues required;Tactile cues required;Need further instruction   ? ?  ?  ? ?  ? ? ? PT Short Term Goals - 05/11/21 0812   ? ?  ? PT SHORT TERM GOAL #1  ? Title Pt will be independent with INITIAL  HEP in order to improve strength and balance in order to decrease fall risk and improve function at home and work.   ? Baseline 04/07/2021= No formal HEP in place. 05/11/2021= Patient reports doing pretty good- compliant with walking program and his stretching/strengthening.   ? Time 6   ? Period Weeks   ? Status Achieved   ? Target Date 05/19/21   ?  ? PT SHORT TERM GOAL #2  ? Title Pt will decrease 5TSTS by at least 3 seconds in order to demonstrate clinically significant improvement in LE strength.   ? Baseline 04/07/2021= 25 sec; 05/11/2021= 18.65 sec   ? Time 6   ? Period Weeks   ? Status Achieved   ? Target Date 06/30/21   ? ?  ?  ? ?  ? ? ? ? PT Long Term Goals - 06/08/21 0948   ? ?  ? PT LONG TERM GOAL #1  ? Title Pt will be independent  with FINAL HEP in order to improve strength and balance in order to decrease fall risk and improve function at home and work.   ? Baseline 04/07/2021= No formal HEP in place.   ? Time 12   ? Period Weeks   ?

## 2021-06-22 ENCOUNTER — Ambulatory Visit: Payer: Medicare HMO

## 2021-06-22 DIAGNOSIS — R2689 Other abnormalities of gait and mobility: Secondary | ICD-10-CM

## 2021-06-22 DIAGNOSIS — R262 Difficulty in walking, not elsewhere classified: Secondary | ICD-10-CM

## 2021-06-22 DIAGNOSIS — R2681 Unsteadiness on feet: Secondary | ICD-10-CM

## 2021-06-22 DIAGNOSIS — R278 Other lack of coordination: Secondary | ICD-10-CM

## 2021-06-22 DIAGNOSIS — R269 Unspecified abnormalities of gait and mobility: Secondary | ICD-10-CM

## 2021-06-22 NOTE — Therapy (Signed)
[place] [facility] ?[place], [place], [address] ?Phone: [phone]   Fax:  [phone] ? ?Physical Therapy Treatment ? ?Patient Details  ?Name: [name] ?MRN: [id number] ?Date of Birth: 27-Jan-1946 ?Referring Provider (PT): Dr. Gurney Maxin ? ? ?Encounter Date: 06/22/2021 ? ? PT End of Session - 06/22/21 0947   ? ? Visit Number 24   ? Number of Visits 25   ? Date for PT Re-Evaluation 06/30/21   ? Authorization Time Period Initial Certification= 10/03/8848- 06/30/2021; PN on 05/11/2021   ? Progress Note Due on Visit 20   ? PT Start Time [phone]   ? PT Stop Time 1013   ? PT Time Calculation (min) 42 min   ? Equipment Utilized During Treatment Gait belt   ? Activity Tolerance Patient tolerated treatment well   ? Behavior During Therapy [facility] for tasks assessed/performed   ? ?  ?  ? ?  ? ? ?Past Medical History:  ?Diagnosis Date  ? Anxiety   ? Arteriosclerosis of coronary artery 11/16/2011  ? Overview:  Stent 10/2011 stent rca 2015 with collaterals to lad which is chronically occluded   ? Benign enlargement of prostate   ? Benign essential HTN 06/11/2014  ? Benign prostatic hypertrophy without urinary obstruction 07/31/2014  ? Bilateral cataracts 05/16/2013  ? Overview:  Dr. [facility]    ? Bone spur of foot   ? Left  ? BP (high blood pressure) 11/09/2012  ? Cancer [facility])   ? skin (forehead) and bladder  ? Carotid artery narrowing 02/08/2014  ? Depression   ? Detrusor hypertrophy   ? Diabetes ([place])   ? Diabetes mellitus, type 2 ([place]) 12/12/2012  ? Diverticulosis   ? Dyspnea   ? Esophageal reflux   ? Esophageal reflux   ? Fothergill's neuralgia 08/07/2012  ? Overview:  [facility]   ? Gastritis   ? GERD (gastroesophageal reflux disease)   ? Headache   ? cluster headaches  ? 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 Hearing loss in left ear   ? Heart disease   ? Hematuria   ? Hemorrhoids   ? History of hiatal hernia 12/14/2017  ? small   ? Hypercholesteremia   ?  Lesion of bladder   ? Myocardial infarct [facility])   ? Presence of stent in coronary artery 11/09/2012  ? Rectal bleeding   ? Trigeminal neuralgia   ? Trigeminal neuralgia   ? Valvular heart disease   ? Vitamin D deficiency   ? ? ?Past Surgical History:  ?Procedure Laterality Date  ? APPENDECTOMY    ? BOTOX INJECTION N/A 12/21/2017  ? Procedure: Bladder BOTOX INJECTION;  Surgeon: [name], MD;  Location: [facility] ORS;  Service: Urology;  Laterality: N/A;  ? BOTOX INJECTION N/A 09/11/2018  ? Procedure: Bladder BOTOX INJECTION;  Surgeon: [name], MD;  Location: [facility] ORS;  Service: Urology;  Laterality: N/A;  ? CARDIAC CATHETERIZATION    ? CARDIAC CATHETERIZATION N/A 01/22/2015  ? Procedure: Left Heart Cath;  Surgeon: Corey Skains, MD;  Location: [place] CV LAB;  Service: Cardiovascular;  Laterality: N/A;  ? CARDIAC CATHETERIZATION N/A 01/22/2015  ? Procedure: Coronary Stent Intervention;  Surgeon: Isaias Cowman, MD;  Location: [place] CV LAB;  Service: Cardiovascular;  Laterality: N/A;  ? CATARACT EXTRACTION, BILATERAL    ? COLONOSCOPY WITH PROPOFOL N/A 12/08/2015  ? Procedure: COLONOSCOPY WITH PROPOFOL;  Surgeon: Lollie Sails, MD;  Location: [facility] ENDOSCOPY;  Service: Endoscopy;  Laterality: N/A;  ? COLONOSCOPY WITH PROPOFOL N/A 12/09/2015  ? Procedure: COLONOSCOPY WITH PROPOFOL;  Surgeon: Lollie Sails, MD;  Location: [facility] ENDOSCOPY;  Service: Endoscopy;  Laterality: N/A;  ? CORONARY ANGIOPLASTY    ? 5 stents  ? CORONARY STENT PLACEMENT  2015  ? x5  ? CYSTOSCOPY N/A 09/11/2018  ? Procedure: CYSTOSCOPY;  Surgeon: [name], MD;  Location: [facility] ORS;  Service: Urology;  Laterality: N/A;  ? CYSTOSCOPY WITH BIOPSY N/A 12/21/2017  ? Procedure: CYSTOSCOPY WITH BIOPSY;  Surgeon: [name], MD;  Location: [facility] ORS;  Service: Urology;  Laterality: N/A;  ? ESOPHAGOGASTRODUODENOSCOPY (EGD) WITH PROPOFOL N/A 12/08/2015  ? Procedure: ESOPHAGOGASTRODUODENOSCOPY (EGD) WITH PROPOFOL;   Surgeon: Lollie Sails, MD;  Location: [facility] ENDOSCOPY;  Service: Endoscopy;  Laterality: N/A;  ? ESOPHAGOGASTRODUODENOSCOPY (EGD) WITH PROPOFOL N/A 12/13/2017  ? Procedure: ESOPHAGOGASTRODUODENOSCOPY (EGD) WITH PROPOFOL;  Surgeon: Lollie Sails, MD;  Location: [facility] ENDOSCOPY;  Service: Endoscopy;  Laterality: N/A;  ? EYE SURGERY    ? HERNIA REPAIR    ? kidney tumor remove    ? TRANSURETHRAL RESECTION OF BLADDER TUMOR WITH GYRUS (TURBT-GYRUS)  12/2013  ? UMBILICAL HERNIA REPAIR    ? urethral meatotomy    ? ? ?There were no vitals filed for this visit. ? ? Subjective Assessment - 06/22/21 0937   ? ? Subjective Patient reports feeling okay today with no new complaints. Denies any pain and no falls   ? Patient is accompained by: Family member   ? Pertinent History Patient is a 76 year old male with referral for Physical Therapy with diagnosis of Parkinsons disease and history of falling. He reports he diagnosed last year with Parkinsons and has improved his tremors with use of medication but reports worsening shuffling and difficulty with mobility. Patient has past medical history signifiant for Parkinsons; Arthritis, bladder cancer, Trigemic Neuralgia. He lives with wife in 1 level home and current using cane with reported recent fall.   ? Limitations Walking;Lifting;Standing;House hold activities   ? How long can you sit comfortably? No limits   ? How long can you stand comfortably? 15 min- limited due to back pain and foot numbess   ? How long can you walk comfortably? about 80 feet   ? Patient Stated Goals Improve my balance, be able to get up off the floor so I can maybe work in my garage again.   ? Currently in Pain? No/denies   ? ?  ?  ? ?  ? ? ? ? ? ? ?INTERVENTIONS:  ? ?Nustep-INTERVAL training ? ?L0- 1 min ?L1-1 min ?L0- 1 min ?L3-1 min ?L0- 66mn ?L5- 147m ? ?Patient reported 4/10 on RPE scale using LE only for LE strengthening. ? ?Seated LAQ with 3lb AW alt LE x 15 reps ? ?Seated hip march with 3lb  AW Alt LE x 15 reps ? ?Sit to stand while holding onto 2.2 lb yellow ball with outstretched arms then once in standing performing overhead raise.  ? ? ? ? ?Standing heel to toe gait sequencing with 3lbl AW and use of GTB for extra resistance - Start in toe off position and swing leg forward to heel strike x 15 rep each LE. Patient reported increased  ? ?Retro/Forward  gait with 3lb AW approx 8 feet x 10 - VC for increasing step length to avoid shuffling.  ? ? ? ? ? ? ? ? ? ? ? ? ? ? PT Education - 06/22/21 0939   ? ?  Education Details Exercise technique   ? Person(s) Educated Patient   ? Methods Explanation;Demonstration;Tactile cues;Verbal cues   ? Comprehension Verbalized understanding;Returned demonstration;Verbal cues required;Tactile cues required;Need further instruction   ? ?  ?  ? ?  ? ? ? PT Short Term Goals - 05/11/21 0812   ? ?  ? PT SHORT TERM GOAL #1  ? Title Pt will be independent with INITIAL  HEP in order to improve strength and balance in order to decrease fall risk and improve function at home and work.   ? Baseline 04/07/2021= No formal HEP in place. 05/11/2021= Patient reports doing pretty good- compliant with walking program and his stretching/strengthening.   ? Time 6   ? Period Weeks   ? Status Achieved   ? Target Date 05/19/21   ?  ? PT SHORT TERM GOAL #2  ? Title Pt will decrease 5TSTS by at least 3 seconds in order to demonstrate clinically significant improvement in LE strength.   ? Baseline 04/07/2021= 25 sec; 05/11/2021= 18.65 sec   ? Time 6   ? Period Weeks   ? Status Achieved   ? Target Date 06/30/21   ? ?  ?  ? ?  ? ? ? ? PT Long Term Goals - 06/08/21 0948   ? ?  ? PT LONG TERM GOAL #1  ? Title Pt will be independent with FINAL HEP in order to improve strength and balance in order to decrease fall risk and improve function at home and work.   ? Baseline 04/07/2021= No formal HEP in place.   ? Time 12   ? Period Weeks   ? Status On-going   ? Target Date 06/30/21   ?  ? PT LONG TERM GOAL #2  ?  Title Pt will improve FOTO to target score of 45% to display perceived improvements in ability to complete ADL's.   ? Baseline 2/7/22023= 41%; FOTO=53%   ? Time 12   ? Period Weeks   ? Status Achieved   ? Ta

## 2021-06-24 ENCOUNTER — Ambulatory Visit: Payer: Medicare HMO

## 2021-06-24 DIAGNOSIS — R2681 Unsteadiness on feet: Secondary | ICD-10-CM

## 2021-06-24 DIAGNOSIS — R2689 Other abnormalities of gait and mobility: Secondary | ICD-10-CM

## 2021-06-24 DIAGNOSIS — R278 Other lack of coordination: Secondary | ICD-10-CM

## 2021-06-24 DIAGNOSIS — R269 Unspecified abnormalities of gait and mobility: Secondary | ICD-10-CM

## 2021-06-24 DIAGNOSIS — R262 Difficulty in walking, not elsewhere classified: Secondary | ICD-10-CM

## 2021-06-24 DIAGNOSIS — M6281 Muscle weakness (generalized): Secondary | ICD-10-CM

## 2021-06-24 DIAGNOSIS — G8929 Other chronic pain: Secondary | ICD-10-CM

## 2021-06-24 NOTE — Therapy (Signed)
[place] [facility] ?[place], [place], [address] ?Phone: [phone]   Fax:  [phone] ? ?Physical Therapy Treatment ? ?Patient Details  ?Name: [name] ?MRN: [id number] ?Date of Birth: 09-22-1945 ?Referring Provider (PT): Dr. Gurney Maxin ? ? ?Encounter Date: 06/24/2021 ? ? PT End of Session - 06/24/21 0936   ? ? Visit Number 25   ? Number of Visits 25   ? Date for PT Re-Evaluation 06/30/21   ? Authorization Time Period Initial Certification= 07/01/9765- 06/30/2021; PN on 05/11/2021   ? Progress Note Due on Visit 20   ? PT Start Time 0930   ? PT Stop Time 1013   ? PT Time Calculation (min) 43 min   ? Equipment Utilized During Treatment Gait belt   ? Activity Tolerance Patient tolerated treatment well   ? Behavior During Therapy [facility] for tasks assessed/performed   ? ?  ?  ? ?  ? ? ?Past Medical History:  ?Diagnosis Date  ? Anxiety   ? Arteriosclerosis of coronary artery 11/16/2011  ? Overview:  Stent 10/2011 stent rca 2015 with collaterals to lad which is chronically occluded   ? Benign enlargement of prostate   ? Benign essential HTN 06/11/2014  ? Benign prostatic hypertrophy without urinary obstruction 07/31/2014  ? Bilateral cataracts 05/16/2013  ? Overview:  [facility]    ? Bone spur of foot   ? Left  ? BP (high blood pressure) 11/09/2012  ? Cancer [facility])   ? skin (forehead) and bladder  ? Carotid artery narrowing 02/08/2014  ? Depression   ? Detrusor hypertrophy   ? Diabetes ([place])   ? Diabetes mellitus, type 2 ([place]) 12/12/2012  ? Diverticulosis   ? Dyspnea   ? Esophageal reflux   ? Esophageal reflux   ? Fothergill's neuralgia 08/07/2012  ? Overview:  [facility]   ? Gastritis   ? GERD (gastroesophageal reflux disease)   ? Headache   ? cluster headaches  ? 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 Hearing loss in left ear   ? Heart disease   ? Hematuria   ? Hemorrhoids   ? History of hiatal hernia 12/14/2017  ? small   ? Hypercholesteremia   ?  Lesion of bladder   ? Myocardial infarct [facility])   ? Presence of stent in coronary artery 11/09/2012  ? Rectal bleeding   ? Trigeminal neuralgia   ? Trigeminal neuralgia   ? Valvular heart disease   ? Vitamin D deficiency   ? ? ?Past Surgical History:  ?Procedure Laterality Date  ? APPENDECTOMY    ? BOTOX INJECTION N/A 12/21/2017  ? Procedure: Bladder BOTOX INJECTION;  Surgeon: [name], MD;  Location: [facility]S;  Service: Urology;  Laterality: N/A;  ? BOTOX INJECTION N/A 09/11/2018  ? Procedure: Bladder BOTOX INJECTION;  Surgeon: [name], MD;  Location: [facility] ORS;  Service: Urology;  Laterality: N/A;  ? CARDIAC CATHETERIZATION    ? CARDIAC CATHETERIZATION N/A 01/22/2015  ? Procedure: Left Heart Cath;  Surgeon: Corey Skains, MD;  Location: [place] CV LAB;  Service: Cardiovascular;  Laterality: N/A;  ? CARDIAC CATHETERIZATION N/A 01/22/2015  ? Procedure: Coronary Stent Intervention;  Surgeon: Isaias Cowman, MD;  Location: [place] CV LAB;  Service: Cardiovascular;  Laterality: N/A;  ? CATARACT EXTRACTION, BILATERAL    ? COLONOSCOPY WITH PROPOFOL N/A 12/08/2015  ? Procedure: COLONOSCOPY WITH PROPOFOL;  Surgeon: Lollie Sails, MD;  Location: [facility] ENDOSCOPY;  Service: Endoscopy;  Laterality: N/A;  ? COLONOSCOPY WITH PROPOFOL N/A 12/09/2015  ? Procedure: COLONOSCOPY WITH PROPOFOL;  Surgeon: Lollie Sails, MD;  Location[facility] ENDOSCOPY;  Service: Endoscopy;  Laterality: N/A;  ? CORONARY ANGIOPLASTY    ? 5 stents  ? CORONARY STENT PLACEMENT  2015  ? x5  ? CYSTOSCOPY N/A 09/11/2018  ? Procedure: CYSTOSCOPY;  Surgeon: [name], MD;  Location: [facility] ORS;  Service: Urology;  Laterality: N/A;  ? CYSTOSCOPY WITH BIOPSY N/A 12/21/2017  ? Procedure: CYSTOSCOPY WITH BIOPSY;  Surgeon: [name], MD;  Location: [facility] ORS;  Service: Urology;  Laterality: N/A;  ? ESOPHAGOGASTRODUODENOSCOPY (EGD) WITH PROPOFOL N/A 12/08/2015  ? Procedure: ESOPHAGOGASTRODUODENOSCOPY (EGD) WITH PROPOFOL;   Surgeon: Lollie Sails, MD;  Location[facility] END[facility]COPY;  Service: Endoscopy;  Laterality: N/A;  ? ESOPHAGOGASTRODUODENOSCOPY (EGD) WITH PROPOFOL N/A 12/13/2017  ? Procedure: ESOPHAGOGASTRODUODENOSCOPY (EGD) WITH PROPOFOL;  Surgeon: Lollie Sails, MD;  Location[facility] END[facility]COPY;  Service: Endoscopy;  Laterality: N/A;  ? EYE SURGERY    ? HERNIA REPAIR    ? kidney tumor remove    ? TRANSURETHRAL RESECTION OF BLADDER TUMOR WITH GYRUS (TURBT-GYRUS)  12/2013  ? UMBILICAL HERNIA REPAIR    ? urethral meatotomy    ? ? ?There were no vitals filed for this visit. ? ? Subjective Assessment - 06/24/21 0934   ? ? Subjective Patient reports feeling okay today with no new complaints. Denies any pain and no falls   ? Patient is accompained by: Family member   ? Pertinent History Patient is a 76 year old male with referral for Physical Therapy with diagnosis of Parkinsons disease and history of falling. He reports he diagnosed last year with Parkinsons and has improved his tremors with use of medication but reports worsening shuffling and difficulty with mobility. Patient has past medical history signifiant for Parkinsons; Arthritis, bladder cancer, Trigemic Neuralgia. He lives with wife in 1 level home and current using cane with reported recent fall.   ? Limitations Walking;Lifting;Standing;House hold activities   ? How long can you sit comfortably? No limits   ? How long can you stand comfortably? 15 min- limited due to back pain and foot numbess   ? How long can you walk comfortably? about 80 feet   ? Patient Stated Goals Improve my balance, be able to get up off the floor so I can maybe work in my garage again.   ? Currently in Pain? No/denies   ? ?  ?  ? ?  ? ? ?INTERVENTIONS:  ? ?Nustep-INTERVAL training for LE strength. (With moist heat to low back)  ?  ?L0- 1 min ?L2-1 min ?L0- 1 min ?L3-1 min ?L0- 30mn ?L5- 194m ?Total 6 min; RPE =4/10 ? ? ?Neuromuscular re-ed in // bars:  ? ?Step tap onto 6" block x 25 reps each LE  without UE  ?Step up onto 6" block without UE support x 12 reps each LE ?staggered stand (back foot standing on 6" block and front foot on top of bosu with curve side up)  ? ?Ladder drill using 4 1/2 foams lined up at // bars- forward/back ?Ladder drill using 4 1/2 foams lined up at // bars- Sidestep ? ?Sit to stand with one foot on top of 1/2 foam (curve side up) x 6 reps and no UE support then switch feet for 6 more- Total reps = 12  ? ?Toe walk in // bars with min UE support (down and back x 3)  ?  Heel walk in // bars with min UE support (down and back x3)  ? ? ?Education provided throughout session via VC/TC and demonstration to facilitate movement at target joints and correct muscle activation for all testing and exercises performed.  ? ? ? ? ? ? ? ? ? ? ? ? ? ? ? ? PT Education - 06/24/21 0935   ? ? Education Details Exercise technique   ? Person(s) Educated Patient   ? Methods Explanation;Demonstration;Tactile cues;Verbal cues   ? Comprehension Verbalized understanding;Returned demonstration;Verbal cues required;Tactile cues required;Need further instruction   ? ?  ?  ? ?  ? ? ? PT Short Term Goals - 05/11/21 0812   ? ?  ? PT SHORT TERM GOAL #1  ? Title Pt will be independent with INITIAL  HEP in order to improve strength and balance in order to decrease fall risk and improve function at home and work.   ? Baseline 04/07/2021= No formal HEP in place. 05/11/2021= Patient reports doing pretty good- compliant with walking program and his stretching/strengthening.   ? Time 6   ? Period Weeks   ? Status Achieved   ? Target Date 05/19/21   ?  ? PT SHORT TERM GOAL #2  ? Title Pt will decrease 5TSTS by at least 3 seconds in order to demonstrate clinically significant improvement in LE strength.   ? Baseline 04/07/2021= 25 sec; 05/11/2021= 18.65 sec   ? Time 6   ? Period Weeks   ? Status Achieved   ? Target Date 06/30/21   ? ?  ?  ? ?  ? ? ? ? PT Long Term Goals - 06/08/21 0948   ? ?  ? PT LONG TERM GOAL #1  ? Title Pt will  be independent with FINAL HEP in order to improve strength and balance in order to decrease fall risk and improve function at home and work.   ? Baseline 04/07/2021= No formal HEP in place.   ? Time 12   ? Pe

## 2021-06-26 ENCOUNTER — Ambulatory Visit: Payer: Medicare HMO | Admitting: Physical Therapy

## 2021-06-30 NOTE — Progress Notes (Signed)
? ? ?07/01/2021 ?12:00 PM  ? ?[name] ?1945-09-08 ?[id number] ? ?Referring provider: Earlie Counts, FNP ?No address on file ? ?Chief Complaint  ?Patient presents with  ? Over Active Bladder  ? ?Urological history: ?1.  Refractory OAB ?-Contributing factors of age, diuretics, anxiety, BPH, hypertension, depression, detrusor hypertrophy, Parkinson's disease and diabetes ?-PVR 72 mL ?-Oxybutynin XL 15 mg -equivocal results, discontinued due to patient's dementia ?-Myrbetriq 25 mg -equivocal results, cost prohibitive ?-Botox injection 11/2017 in OR - at goal ?-Botox injection 08/2018 in OR -lost effectiveness after 3 months ?-Trial of antibiotics 12/2018 -no improvement in symptoms ?-Botox injection 03/2019 in office - ineffective  ?-PTNS 06/25/2019 - 09/10/2019 for 12 weekly treatments - ineffective ?-Gemtesa 75 mg -equivocal results ?-failed test stimulation 04/2020 ? ?2. Bladder cancer ?-TURBT on 01/02/2014  - pathology Lg Ta TCC ?-cysto 2022 - NED ?-repeat cysto 2023  ? ?HPI: ?[name] is a 76 y.o. male who presents today for follow up for worsening symptoms with his wife, [name].   ? ?He states that he has recently been diagnosed with Parkinson and he has been having more issues of urinary incontinence wetting through his depends.  He is currently on oxybutynin. ? ?Patient denies any modifying or aggravating factors.  Patient denies any gross hematuria, dysuria or suprapubic/flank pain.  Patient denies any fevers, chills, nausea or vomiting.   ? ?UA benign  ? ?PVR 72 mL  ?  ? ?PMH: ?Past Medical History:  ?Diagnosis Date  ? Anxiety   ? Arteriosclerosis of coronary artery 11/16/2011  ? Overview:  Stent 10/2011 stent rca 2015 with collaterals to lad which is chronically occluded   ? Benign enlargement of prostate   ? Benign essential HTN 06/11/2014  ? Benign prostatic hypertrophy without urinary obstruction 07/31/2014  ? Bilateral cataracts 05/16/2013  ? Overview:  Dr. [facility]    ? Bone spur of foot    ? Left  ? BP (high blood pressure) 11/09/2012  ? Cancer [facility])   ? skin (forehead) and bladder  ? Carotid artery narrowing 02/08/2014  ? Depression   ? Detrusor hypertrophy   ? Diabetes ([place])   ? Diabetes mellitus, type 2 ([place]) 12/12/2012  ? Diverticulosis   ? Dyspnea   ? Esophageal reflux   ? Esophageal reflux   ? Fothergill's neuralgia 08/07/2012  ? Overview:  [facility]   ? Gastritis   ? GERD (gastroesophageal reflux disease)   ? Headache   ? cluster headaches  ? 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 Hearing loss in left ear   ? Heart disease   ? Hematuria   ? Hemorrhoids   ? History of hiatal hernia 12/14/2017  ? small   ? Hypercholesteremia   ? Lesion of bladder   ? Myocardial infarct [facility])   ? Presence of stent in coronary artery 11/09/2012  ? Rectal bleeding   ? Trigeminal neuralgia   ? Trigeminal neuralgia   ? Valvular heart disease   ? Vitamin D deficiency   ? ? ?Surgical History: ?Past Surgical History:  ?Procedure Laterality Date  ? APPENDECTOMY    ? BOTOX INJECTION N/A 12/21/2017  ? Procedure: Bladder BOTOX INJECTION;  Surgeon: [name], MD;  Location: [facility] ORS;  Service: Urology;  Laterality: N/A;  ? BOTOX INJECTION N/A 09/11/2018  ? Procedure: Bladder BOTOX INJECTION;  Surgeon: [name], MD;  Location: [facility] ORS;  Service: Urology;  Laterality: N/A;  ? CARDIAC CATHETERIZATION    ? CARDIAC CATHETERIZATION  N/A 01/22/2015  ? Procedure: Left Heart Cath;  Surgeon: Corey Skains, MD;  Location: [place] CV LAB;  Service: Cardiovascular;  Laterality: N/A;  ? CARDIAC CATHETERIZATION N/A 01/22/2015  ? Procedure: Coronary Stent Intervention;  Surgeon: Isaias Cowman, MD;  Location: [place] CV [facility];  Service: Cardiovascular;  Laterality: N/A;  ? CATARACT EXTRACTION, BILATERAL    ? COLONOSCOPY WITH PROPOFOL N/A 12/08/2015  ? Procedure: COLONOSCOPY WITH PROPOFOL;  Surgeon: Lollie Sails, MD;  Location: [facility] ENDOSCOPY;  Service: Endoscopy;  Laterality: N/A;  ? COLONOSCOPY WITH  PROPOFOL N/A 12/09/2015  ? Procedure: COLONOSCOPY WITH PROPOFOL;  Surgeon: Lollie Sails, MD;  Location[facility] ENDOSCOPY;  Service: Endoscopy;  Laterality: N/A;  ? CORONARY ANGIOPLASTY    ? 5 stents  ? CORONARY STENT PLACEMENT  2015  ? x5  ? CYSTOSCOPY N/A 09/11/2018  ? Procedure: CYSTOSCOPY;  Surgeon: [name], MD;  Location: [facility] ORS;  Service: Urology;  Laterality: N/A;  ? CYSTOSCOPY WITH BIOPSY N/A 12/21/2017  ? Procedure: CYSTOSCOPY WITH BIOPSY;  Surgeon: [name], MD;  Location: [facility] ORS;  Service: Urology;  Laterality: N/A;  ? ESOPHAGOGASTRODUODENOSCOPY (EGD) WITH PROPOFOL N/A 12/08/2015  ? Procedure: ESOPHAGOGASTRODUODENOSCOPY (EGD) WITH PROPOFOL;  Surgeon: Lollie Sails, MD;  Location: [facility] ENDOSCOPY;  Service: Endoscopy;  Laterality: N/A;  ? ESOPHAGOGASTRODUODENOSCOPY (EGD) WITH PROPOFOL N/A 12/13/2017  ? Procedure: ESOPHAGOGASTRODUODENOSCOPY (EGD) WITH PROPOFOL;  Surgeon: Lollie Sails, MD;  Location: [facility] ENDOSCOPY;  Service: Endoscopy;  Laterality: N/A;  ? EYE SURGERY    ? HERNIA REPAIR    ? kidney tumor remove    ? TRANSURETHRAL RESECTION OF BLADDER TUMOR WITH GYRUS (TURBT-GYRUS)  12/2013  ? UMBILICAL HERNIA REPAIR    ? urethral meatotomy    ? ? ?Home Medications:  ?Allergies as of 07/01/2021   ? ?   Reactions  ? Ace Inhibitors Cough  ? Carbamazepine Rash  ? Other reaction(s): RASH ?Other reaction(s): RASH  ? Lyrica [pregabalin] Rash  ? ?  ? ?  ?Medication List  ?  ? ?  ? Accurate as of Jul 01, 2021 12:00 PM. If you have any questions, ask your nurse or doctor.  ?  ?  ? ?  ? ?STOP taking these medications   ? ?oxybutynin 10 MG 24 hr tablet ?Commonly known as: DITROPAN-XL ?Stopped by: Zara Council, PA-C ?  ?solifenacin 5 MG tablet ?Commonly known as: VESICARE ?Stopped by: Zara Council, PA-C ?  ? ?  ? ?TAKE these medications   ? ?albuterol 108 (90 Base) MCG/ACT inhaler ?Commonly known as: VENTOLIN HFA ?  ?apixaban 2.5 MG Tabs tablet ?Commonly known as: ELIQUIS ?Take 1 tablet  (2.5 mg total) by mouth 2 (two) times daily. ?  ?aspirin 81 MG tablet ?Take 81 mg by mouth daily. ?  ?Cholecalciferol 25 MCG (1000 UT) tablet ?Take 1,000 Units by mouth daily. ?  ?citalopram 20 MG tablet ?Commonly known as: CELEXA ?Take 20 mg by mouth at bedtime. ?  ?Docusate Sodium 100 MG capsule ?Take by mouth. ?  ?hydrocortisone 2.5 % cream ?  ?isosorbide mononitrate 30 MG 24 hr tablet ?Commonly known as: IMDUR ?Take 60 mg by mouth daily. ?  ?ketoconazole 2 % cream ?Commonly known as: NIZORAL ?  ?multivitamins with iron Tabs tablet ?Take 1 tablet by mouth daily. ?  ?nitroGLYCERIN 0.4 MG SL tablet ?Commonly known as: NITROSTAT ?Place under the tongue. ?  ?Omega-3 1000 MG Caps ?Take 1 g by mouth daily. Reported on 03/21/2015 ?  ?ONE TOUCH  ULTRA TEST test strip ?Generic drug: glucose blood ?USE 3 (THREE) TIMES DAILY USE AS INSTRUCTED.- ONE TOUCH ULTRA BLOOD GLUCOSE METER STRIPS ?  ?OXcarbazepine 150 MG tablet ?Commonly known as: TRILEPTAL ?Take 150 mg by mouth at bedtime. ?  ?pantoprazole 40 MG tablet ?Commonly known as: PROTONIX ?Take 40 mg by mouth daily. ?  ?pramipexole 1 MG tablet ?Commonly known as: MIRAPEX ?Take 1 mg by mouth daily. ?  ?RA Blood Glucose Monitor Devi ?by Does not apply route. ?  ?rosuvastatin 20 MG tablet ?Commonly known as: CRESTOR ?Take by mouth. ?  ?sildenafil 50 MG tablet ?Commonly known as: VIAGRA ?Take 50 mg by mouth daily as needed for erectile dysfunction. ?  ?terbinafine 250 MG tablet ?Commonly known as: LAMISIL ?Take by mouth. ?  ?valsartan 40 MG tablet ?Commonly known as: DIOVAN ?Take 40 mg by mouth daily. ?  ?Vibegron 75 MG Tabs ?Take 75 mg by mouth daily. ?  ? ?  ? ? ?Allergies:  ?Allergies  ?Allergen Reactions  ? Ace Inhibitors Cough  ? Carbamazepine Rash  ?  Other reaction(s): RASH ?Other reaction(s): RASH ?  ? Lyrica [Pregabalin] Rash  ? ? ?Family History: ?Family History  ?Problem Relation Age of Onset  ? Kidney cancer Mother   ? Prostate cancer Neg Hx   ? ? ?Social History:   reports that he quit smoking about 31 years ago. His smoking use included cigarettes. He has a 80.00 pack-year smoking history. He has never used smokeless tobacco. He reports that he does not currently use alc

## 2021-07-01 ENCOUNTER — Ambulatory Visit: Payer: Medicare HMO | Admitting: Urology

## 2021-07-01 ENCOUNTER — Ambulatory Visit: Payer: Medicare HMO | Attending: Neurology

## 2021-07-01 ENCOUNTER — Encounter: Payer: Self-pay | Admitting: Urology

## 2021-07-01 VITALS — BP 107/60 | HR 61 | Ht 67.0 in | Wt 226.0 lb

## 2021-07-01 DIAGNOSIS — R262 Difficulty in walking, not elsewhere classified: Secondary | ICD-10-CM | POA: Diagnosis present

## 2021-07-01 DIAGNOSIS — M545 Low back pain, unspecified: Secondary | ICD-10-CM | POA: Diagnosis present

## 2021-07-01 DIAGNOSIS — M6281 Muscle weakness (generalized): Secondary | ICD-10-CM | POA: Insufficient documentation

## 2021-07-01 DIAGNOSIS — Z8551 Personal history of malignant neoplasm of bladder: Secondary | ICD-10-CM

## 2021-07-01 DIAGNOSIS — G8929 Other chronic pain: Secondary | ICD-10-CM | POA: Insufficient documentation

## 2021-07-01 DIAGNOSIS — C679 Malignant neoplasm of bladder, unspecified: Secondary | ICD-10-CM

## 2021-07-01 DIAGNOSIS — R2681 Unsteadiness on feet: Secondary | ICD-10-CM | POA: Insufficient documentation

## 2021-07-01 DIAGNOSIS — R278 Other lack of coordination: Secondary | ICD-10-CM | POA: Insufficient documentation

## 2021-07-01 DIAGNOSIS — R2689 Other abnormalities of gait and mobility: Secondary | ICD-10-CM | POA: Diagnosis present

## 2021-07-01 DIAGNOSIS — N3281 Overactive bladder: Secondary | ICD-10-CM

## 2021-07-01 DIAGNOSIS — R269 Unspecified abnormalities of gait and mobility: Secondary | ICD-10-CM | POA: Diagnosis present

## 2021-07-01 LAB — MICROSCOPIC EXAMINATION

## 2021-07-01 LAB — URINALYSIS, COMPLETE
Bilirubin, UA: NEGATIVE
Glucose, UA: NEGATIVE
Ketones, UA: NEGATIVE
Leukocytes,UA: NEGATIVE
Nitrite, UA: NEGATIVE
Protein,UA: NEGATIVE
RBC, UA: NEGATIVE
Specific Gravity, UA: 1.02 (ref 1.005–1.030)
Urobilinogen, Ur: 0.2 mg/dL (ref 0.2–1.0)
pH, UA: 5.5 (ref 5.0–7.5)

## 2021-07-01 LAB — BLADDER SCAN AMB NON-IMAGING

## 2021-07-01 MED ORDER — VIBEGRON 75 MG PO TABS: 75.0000 mg | ORAL_TABLET | Freq: Every day | ORAL | 0 refills | Status: DC

## 2021-07-01 NOTE — Therapy (Signed)
[facility] ?[place], [place], [address] ?Phone: [phone]   Fax:  [phone] ? ?Physical Therapy Treatment/Recertification for dates 07/01/2021-09/23/2021 ? ?Patient Details  ?Name: [name] ?MRN: [id number] ?Date of Birth: 02-11-1946 ?Referring Provider (PT): Dr. Gurney Maxin ? ? ?Encounter Date: 07/01/2021 ? ? PT End of Session - 07/01/21 0809   ? ? Visit Number 26   ? Number of Visits 25   ? Date for PT Re-Evaluation 09/23/21   ? Authorization Time Period Initial Certification= 0/10/3816- 06/30/2021; PN on 2/99/3716; Recert   ? Progress Note Due on Visit 30   ? PT Start Time 0800   ? PT Stop Time 0841   ? PT Time Calculation (min) 41 min   ? Equipment Utilized During Treatment Gait belt   ? Activity Tolerance Patient tolerated treatment well   ? Behavior During Therapy [facility] for tasks assessed/performed   ? ?  ?  ? ?  ? ? ?Past Medical History:  ?Diagnosis Date  ? Anxiety   ? Arteriosclerosis of coronary artery 11/16/2011  ? Overview:  Stent 10/2011 stent rca 2015 with collaterals to lad which is chronically occluded   ? Benign enlargement of prostate   ? Benign essential HTN 06/11/2014  ? Benign prostatic hypertrophy without urinary obstruction 07/31/2014  ? Bilateral cataracts 05/16/2013  ? Overview:  [facility]. [facility]    ? Bone spur of foot   ? Left  ? BP (high blood pressure) 11/09/2012  ? Cancer [facility])   ? skin (forehead) and bladder  ? Carotid artery narrowing 02/08/2014  ? Depression   ? Detrusor hypertrophy   ? Diabetes ([place])   ? Diabetes mellitus, type 2 ([place]) 12/12/2012  ? Diverticulosis   ? Dyspnea   ? Esophageal reflux   ? Esophageal reflux   ? Fothergill's neuralgia 08/07/2012  ? Overview:  [facility]   ? Gastritis   ? GERD (gastroesophageal reflux disease)   ? Headache   ? cluster headaches  ? 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 Hearing loss in left ear   ? Heart disease   ? Hematuria   ? Hemorrhoids   ? History of hiatal  hernia 12/14/2017  ? small   ? Hypercholesteremia   ? Lesion of bladder   ? Myocardial infarct [facility])   ? Presence of stent in coronary artery 11/09/2012  ? Rectal bleeding   ? Trigeminal neuralgia   ? Trigeminal neuralgia   ? Valvular heart disease   ? Vitamin D deficiency   ? ? ?Past Surgical History:  ?Procedure Laterality Date  ? APPENDECTOMY    ? BOTOX INJECTION N/A 12/21/2017  ? Procedure: Bladder BOTOX INJECTION;  Surgeon: [name], MD;  Location: [facility] ORS;  Service: Urology;  Laterality: N/A;  ? BOTOX INJECTION N/A 09/11/2018  ? Procedure: Bladder BOTOX INJECTION;  Surgeon: [name], MD;  Location: [facility] ORS;  Service: Urology;  Laterality: N/A;  ? CARDIAC CATHETERIZATION    ? CARDIAC CATHETERIZATION N/A 01/22/2015  ? Procedure: Left Heart Cath;  Surgeon: Corey Skains, MD;  Location: [place] [facility];  Service: Cardiovascular;  Laterality: N/A;  ? CARDIAC CATHETERIZATION N/A 01/22/2015  ? Procedure: Coronary Stent Intervention;  Surgeon: Isaias Cowman, MD;  Location: [place] CV [facility];  Service: Cardiovascular;  Laterality: N/A;  ? CATARACT EXTRACTION, BILATERAL    ? COLONOSCOPY WITH PROPOFOL N/A 12/08/2015  ? Procedure: COLONOSCOPY WITH PROPOFOL;  Surgeon: Lollie Sails, MD;  Location: [facility] ENDOSCOPY;  Service: Endoscopy;  Laterality: N/A;  ? COLONOSCOPY WITH PROPOFOL N/A 12/09/2015  ? Procedure: COLONOSCOPY WITH PROPOFOL;  Surgeon: Lollie Sails, MD;  Location: [facility] ENDOSCOPY;  Service: Endoscopy;  Laterality: N/A;  ? CORONARY ANGIOPLASTY    ? 5 stents  ? CORONARY STENT PLACEMENT  2015  ? x5  ? CYSTOSCOPY N/A 09/11/2018  ? Procedure: CYSTOSCOPY;  Surgeon: [name], MD;  Location: [facility] ORS;  Service: Urology;  Laterality: N/A;  ? CYSTOSCOPY WITH BIOPSY N/A 12/21/2017  ? Procedure: CYSTOSCOPY WITH BIOPSY;  Surgeon: [name], MD;  Location: [facility] ORS;  Service: Urology;  Laterality: N/A;  ? ESOPHAGOGASTRODUODENOSCOPY (EGD) WITH PROPOFOL N/A 12/08/2015  ? Procedure:  ESOPHAGOGASTRODUODENOSCOPY (EGD) WITH PROPOFOL;  Surgeon: Lollie Sails, MD;  Location: [facility] ENDOSCOPY;  Service: Endoscopy;  Laterality: N/A;  ? ESOPHAGOGASTRODUODENOSCOPY (EGD) WITH PROPOFOL N/A 12/13/2017  ? Procedure: ESOPHAGOGASTRODUODENOSCOPY (EGD) WITH PROPOFOL;  Surgeon: Lollie Sails, MD;  Location: [facility] ENDOSCOPY;  Service: Endoscopy;  Laterality: N/A;  ? EYE SURGERY    ? HERNIA REPAIR    ? kidney tumor remove    ? TRANSURETHRAL RESECTION OF BLADDER TUMOR WITH GYRUS (TURBT-GYRUS)  12/2013  ? UMBILICAL HERNIA REPAIR    ? urethral meatotomy    ? ? ?There were no vitals filed for this visit. ? ? Subjective Assessment - 07/01/21 0807   ? ? Subjective Patient reports he will do his best but 8 am may be tough for retesting today.   ? Patient is accompained by: Family member   ? Pertinent History Patient is a 76 year old male with referral for Physical Therapy with diagnosis of Parkinsons disease and history of falling. He reports he diagnosed last year with Parkinsons and has improved his tremors with use of medication but reports worsening shuffling and difficulty with mobility. Patient has past medical history signifiant for Parkinsons; Arthritis, bladder cancer, Trigemic Neuralgia. He lives with wife in 1 level home and current using cane with reported recent fall.   ? Limitations Walking;Lifting;Standing;House hold activities   ? How long can you sit comfortably? No limits   ? How long can you stand comfortably? 15 min- limited due to back pain and foot numbess   ? How long can you walk comfortably? about 80 feet   ? Patient Stated Goals Improve my balance, be able to get up off the floor so I can maybe work in my garage again.   ? Currently in Pain? No/denies   ? ?  ?  ? ?  ? ? ? ? ?INTERVENTIONS:  ?Nustep-Interval training for LE strength. (With moist heat to low back)  ?  ?L0- 1 min ?L3-1 min ?L0- 1 min ?L4-1 min ?L0- 41mn ?L4- 126m ?L1- 1 min ?L4- 1 min ?Total 37m25m RPE =5/10; Respirtation  =22 ? ?Reassessed LTG for recert visit:  ? ?Patient reports performing walking and some standing LE strengthening exercises as instructed and states no questions at this time. ? ?Timed Up and Go : 20.01 and 17.75 sec with use of cane= 18.88 sec  avg with use of cane  ? ? ? ?10 MWT= 1) 16.69 sec with cane 2) 16.50 sec with cane= 16.60 sec avg= 0.80m73m ? ?6 min Walk test= 665 in 5 min 45 sec today using 4WW ? ? ? ? ? ? ? ? ? ? ? ? ? ? ? ? ? PT Education - 07/01/21 0808   ? ? Education Details Exercise technique   ?  Person(s) Educated Patient   ? Methods Explanation;Demonstration;Tactile cues;Verbal cues   ? Comprehension Returned demonstration;Verbalized understanding;Verbal cues required;Tactile cues required;Need further instruction   ? ?  ?  ? ?  ? ? ? PT Short Term Goals - 05/11/21 0812   ? ?  ? PT SHORT TERM GOAL #1  ? Title Pt will be independent with INITIAL  HEP in order to improve strength and balance in order to decrease fall risk and improve function at home and work.   ? Baseline 04/07/2021= No formal HEP in place. 05/11/2021= Patient reports doing pretty good- compliant with walking program and his stretching/strengthening.   ? Time 6   ? Period Weeks   ? Status Achieved   ? Target Date 05/19/21   ?  ? PT SHORT TERM GOAL #2  ? Title Pt will decrease 5TSTS by at least 3 seconds in order to demonstrate clinically significant improvement in LE strength.   ? Baseline 04/07/2021= 25 sec; 05/11/2021= 18.65 sec   ? Time 6   ? Period Weeks   ? Status Achieved   ? Target Date 06/30/21   ? ?  ?  ? ?  ? ? ? ? PT Long Term Goals - 07/01/21 0818   ? ?  ? PT LONG TERM GOAL #1  ? Title Pt will be independent with FINAL HEP in order to improve strength and balance in order to decrease fall risk and improve function at home and work.   ? Baseline 04/07/2021= No formal HEP in place. 07/01/2021=Patient reports performing walking and some standing LE strengthening exercises as instructed and states no questions at this time.   ?  Time 12   ? Period Weeks   ? Status Partially Met   ? Target Date 09/23/21   ?  ? PT LONG TERM GOAL #2  ? Title Pt will improve FOTO to target score of 45% to display perceived improvements in ability to complet

## 2021-07-03 ENCOUNTER — Ambulatory Visit: Payer: Medicare HMO

## 2021-07-03 DIAGNOSIS — R2681 Unsteadiness on feet: Secondary | ICD-10-CM

## 2021-07-03 DIAGNOSIS — M6281 Muscle weakness (generalized): Secondary | ICD-10-CM

## 2021-07-03 DIAGNOSIS — R2689 Other abnormalities of gait and mobility: Secondary | ICD-10-CM

## 2021-07-03 DIAGNOSIS — R262 Difficulty in walking, not elsewhere classified: Secondary | ICD-10-CM

## 2021-07-03 DIAGNOSIS — R269 Unspecified abnormalities of gait and mobility: Secondary | ICD-10-CM

## 2021-07-03 DIAGNOSIS — M545 Low back pain, unspecified: Secondary | ICD-10-CM

## 2021-07-03 DIAGNOSIS — R278 Other lack of coordination: Secondary | ICD-10-CM

## 2021-07-03 NOTE — Therapy (Signed)
[facility] ?[place], [place], [address] ?Phone: [phone]   Fax:  [phone] ? ?Physical Therapy Treatment ? ?Patient Details  ?Name: [name] ?MRN: [id number] ?Date of Birth: 1945-09-22 ?Referring Provider (PT): Dr. Gurney Maxin ? ? ?Encounter Date: 07/03/2021 ? ? PT End of Session - 07/03/21 5035   ? ? Visit Number 27   ? Number of Visits 50   ? Date for PT Re-Evaluation 09/23/21   ? Authorization Time Period Initial Certification= 06/05/5679- 06/30/2021; PN on 2/75/1700; Recert 03/07/4942-9/67/5916   ? Progress Note Due on Visit 30   ? PT Start Time 0800   ? PT Stop Time 0845   ? PT Time Calculation (min) 45 min   ? Equipment Utilized During Treatment Gait belt   ? Activity Tolerance Patient tolerated treatment well   ? Behavior During Therapy [facility] for tasks assessed/performed   ? ?  ?  ? ?  ? ? ?Past Medical History:  ?Diagnosis Date  ? Anxiety   ? Arteriosclerosis of coronary artery 11/16/2011  ? Overview:  Stent 10/2011 stent rca 2015 with collaterals to lad which is chronically occluded   ? Benign enlargement of prostate   ? Benign essential HTN 06/11/2014  ? Benign prostatic hypertrophy without urinary obstruction 07/31/2014  ? Bilateral cataracts 05/16/2013  ? Overview:  Dr. [facility]    ? Bone spur of foot   ? Left  ? BP (high blood pressure) 11/09/2012  ? Cancer [facility])   ? skin (forehead) and bladder  ? Carotid artery narrowing 02/08/2014  ? Depression   ? Detrusor hypertrophy   ? Diabetes ([place])   ? Diabetes mellitus, type 2 ([place]) 12/12/2012  ? Diverticulosis   ? Dyspnea   ? Esophageal reflux   ? Esophageal reflux   ? Fothergill's neuralgia 08/07/2012  ? Overview:  [facility]   ? Gastritis   ? GERD (gastroesophageal reflux disease)   ? Headache   ? cluster headaches  ? 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 Hearing loss in left ear   ? Heart disease   ? Hematuria   ? Hemorrhoids   ? History of hiatal hernia 12/14/2017  ? small   ?  Hypercholesteremia   ? Lesion of bladder   ? Myocardial infarct [facility])   ? Presence of stent in coronary artery 11/09/2012  ? Rectal bleeding   ? Trigeminal neuralgia   ? Trigeminal neuralgia   ? Valvular heart disease   ? Vitamin D deficiency   ? ? ?Past Surgical History:  ?Procedure Laterality Date  ? APPENDECTOMY    ? BOTOX INJECTION N/A 12/21/2017  ? Procedure: Bladder BOTOX INJECTION;  Surgeon: [name], MD;  Location: [facility] ORS;  Service: Urology;  Laterality: N/A;  ? BOTOX INJECTION N/A 09/11/2018  ? Procedure: Bladder BOTOX INJECTION;  Surgeon: [name], MD;  Location: [facility] ORS;  Service: Urology;  Laterality: N/A;  ? CARDIAC CATHETERIZATION    ? CARDIAC CATHETERIZATION N/A 01/22/2015  ? Procedure: Left Heart Cath;  Surgeon: Corey Skains, MD;  Location: [place] CV LAB;  Service: Cardiovascular;  Laterality: N/A;  ? CARDIAC CATHETERIZATION N/A 01/22/2015  ? Procedure: Coronary Stent Intervention;  Surgeon: Isaias Cowman, MD;  Location: [place] CV LAB;  Service: Cardiovascular;  Laterality: N/A;  ? CATARACT EXTRACTION, BILATERAL    ? COLONOSCOPY WITH PROPOFOL N/A 12/08/2015  ? Procedure: COLONOSCOPY WITH PROPOFOL;  Surgeon: Lollie Sails, MD;  Location:  [place] ENDOSCOPY;  Service: Endoscopy;  Laterality: N/A;  ? COLONOSCOPY WITH PROPOFOL N/A 12/09/2015  ? Procedure: COLONOSCOPY WITH PROPOFOL;  Surgeon: Lollie Sails, MD;  Location: [facility] ENDOSCOPY;  Service: Endoscopy;  Laterality: N/A;  ? CORONARY ANGIOPLASTY    ? 5 stents  ? CORONARY STENT PLACEMENT  2015  ? x5  ? CYSTOSCOPY N/A 09/11/2018  ? Procedure: CYSTOSCOPY;  Surgeon: [name], MD;  Location: [facility] ORS;  Service: Urology;  Laterality: N/A;  ? CYSTOSCOPY WITH BIOPSY N/A 12/21/2017  ? Procedure: CYSTOSCOPY WITH BIOPSY;  Surgeon: [name], MD;  Location: [facility] ORS;  Service: Urology;  Laterality: N/A;  ? ESOPHAGOGASTRODUODENOSCOPY (EGD) WITH PROPOFOL N/A 12/08/2015  ? Procedure: ESOPHAGOGASTRODUODENOSCOPY  (EGD) WITH PROPOFOL;  Surgeon: Lollie Sails, MD;  [facility];  Service: Endoscopy;  Laterality: N/A;  ? ESOPHAGOGASTRODUODENOSCOPY (EGD) WITH PROPOFOL N/A 12/13/2017  ? Procedure: ESOPHAGOGASTRODUODENOSCOPY (EGD) WITH PROPOFOL;  Surgeon: Lollie Sails, MD;  Location: [facility] ENDOSCOPY;  Service: Endoscopy;  Laterality: N/A;  ? EYE SURGERY    ? HERNIA REPAIR    ? kidney tumor remove    ? TRANSURETHRAL RESECTION OF BLADDER TUMOR WITH GYRUS (TURBT-GYRUS)  12/2013  ? UMBILICAL HERNIA REPAIR    ? urethral meatotomy    ? ? ?There were no vitals filed for this visit. ? ? Subjective Assessment - 07/03/21 0810   ? ? Subjective Patient reports he will do his best but 8 am may be tough for retesting today.   ? Patient is accompained by: Family member   ? Pertinent History Patient is a 76 year old male with referral for Physical Therapy with diagnosis of Parkinsons disease and history of falling. He reports he diagnosed last year with Parkinsons and has improved his tremors with use of medication but reports worsening shuffling and difficulty with mobility. Patient has past medical history signifiant for Parkinsons; Arthritis, bladder cancer, Trigemic Neuralgia. He lives with wife in 1 level home and current using cane with reported recent fall.   ? Limitations Walking;Lifting;Standing;House hold activities   ? How long can you sit comfortably? No limits   ? How long can you stand comfortably? 15 min- limited due to back pain and foot numbess   ? How long can you walk comfortably? about 80 feet   ? Patient Stated Goals Improve my balance, be able to get up off the floor so I can maybe work in my garage again.   ? Currently in Pain? No/denies   ? ?  ?  ? ?  ? ? ? ?INTERVENTIONS:  ? ?Nustep Interval training: while added moist heat to low back ? ?L1- 1 min ?L4-1 min ?L1- 1 min ?L4-1 min ?L1- 62mn ?L4- 161m ?L1- 1 min ?L4- 1 min ?Total 66m59m RPE =6/10; total distance = 0.21 mi ? ?Neuromuscular re-ed:   ? ?Ladder drills- in // bars- walking forward focusing on reciprocal gait with increased step length (1 foot per square) x 10 down and back. Patient with initial difficulty stepping on rung on ladder but did improve with practice. Min UE support on railing.  ?Ladder drill- in // bars- Forward/backward step- Increased difficulty with retro steps - mod VC to take longer step backward- exhibiting increased hip tightness/ext weakness.  ?Replaced ladder with 2 (1/2 foams and 2 orange hurdles for added height) and patient performed 10 reps down and back stepping over objects with min UE support- Increased difficulty with multiple episodes of kicking over hurdles.  ?Ladder drill in //  bars - side stepping over same objects - left to right then right to left x 10 reps.  ? ?Standing in // bars- Performed side step to right and then trunk rotation to right +clap then back to left + clap x 15 reps.  ? ?Standing in //bars - Performed wt. Shift to right and reach overhead with same side UE (full ext of fingers to reach as high as possible) then shift to opp side and repeat x 20 reps.  ? ?Education provided throughout session via VC/TC and demonstration to facilitate movement at target joints and correct muscle activation for all testing and exercises performed.  ? ? ? ? ? ? ? ? ? ? ? ? ? ? ? ? ? ? ? ? ? ? ? ? PT Education - 07/03/21 0927   ? ? Education Details Exercise technique   ? Person(s) Educated Patient   ? Methods Explanation;Demonstration;Tactile cues;Verbal cues   ? Comprehension Verbalized understanding;Returned demonstration;Verbal cues required;Tactile cues required;Need further instruction   ? ?  ?  ? ?  ? ? ? PT Short Term Goals - 05/11/21 0812   ? ?  ? PT SHORT TERM GOAL #1  ? Title Pt will be independent with INITIAL  HEP in order to improve strength and balance in order to decrease fall risk and improve function at home and work.   ? Baseline 04/07/2021= No formal HEP in place. 05/11/2021= Patient reports doing  pretty good- compliant with walking program and his stretching/strengthening.   ? Time 6   ? Period Weeks   ? Status Achieved   ? Target Date 05/19/21   ?  ? PT SHORT TERM GOAL #2  ? Title Pt will decrease 5TSTS

## 2021-07-04 LAB — CULTURE, URINE COMPREHENSIVE

## 2021-07-09 ENCOUNTER — Ambulatory Visit: Payer: Medicare HMO

## 2021-07-09 DIAGNOSIS — R2689 Other abnormalities of gait and mobility: Secondary | ICD-10-CM

## 2021-07-09 DIAGNOSIS — R262 Difficulty in walking, not elsewhere classified: Secondary | ICD-10-CM

## 2021-07-09 DIAGNOSIS — R278 Other lack of coordination: Secondary | ICD-10-CM

## 2021-07-09 DIAGNOSIS — R269 Unspecified abnormalities of gait and mobility: Secondary | ICD-10-CM

## 2021-07-09 DIAGNOSIS — R2681 Unsteadiness on feet: Secondary | ICD-10-CM

## 2021-07-09 NOTE — Therapy (Signed)
[facility] ?[place], [place], [address] ?Phone: [phone]   Fax:  [phone] ? ?Physical Therapy Treatment ? ?Patient Details  ?Name: [name] ?MRN: [id number] ?Date of Birth: 1945/09/21 ?Referring Provider (PT): Dr. Gurney Maxin ? ? ?Encounter Date: 07/09/2021 ? ? PT End of Session - 07/09/21 0855   ? ? Visit Number 28   ? Number of Visits 50   ? Date for PT Re-Evaluation 09/23/21   ? Authorization Time Period Initial Certification= 05/30/5828- 06/30/2021; PN on 9/40/7680; Recert 10/06/1101-1/59/4585   ? Progress Note Due on Visit 30   ? PT Start Time 0845   ? PT Stop Time 0929   ? PT Time Calculation (min) 44 min   ? Equipment Utilized During Treatment Gait belt   ? Activity Tolerance Patient tolerated treatment well   ? Behavior During Therapy [facility] for tasks assessed/performed   ? ?  ?  ? ?  ? ? ?Past Medical History:  ?Diagnosis Date  ? Anxiety   ? Arteriosclerosis of coronary artery 11/16/2011  ? Overview:  Stent 10/2011 stent rca 2015 with collaterals to lad which is chronically occluded   ? Benign enlargement of prostate   ? Benign essential HTN 06/11/2014  ? Benign prostatic hypertrophy without urinary obstruction 07/31/2014  ? Bilateral cataracts 05/16/2013  ? Overview:  [facility]. [facility]    ? Bone spur of foot   ? Left  ? BP (high blood pressure) 11/09/2012  ? Cancer [facility])   ? skin (forehead) and bladder  ? Carotid artery narrowing 02/08/2014  ? Depression   ? Detrusor hypertrophy   ? Diabetes ([place])   ? Diabetes mellitus, type 2 ([place]) 12/12/2012  ? Diverticulosis   ? Dyspnea   ? Esophageal reflux   ? Esophageal reflux   ? Fothergill's neuralgia 08/07/2012  ? Overview:  [facility]   ? Gastritis   ? GERD (gastroesophageal reflux disease)   ? Headache   ? cluster headaches  ? 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 Hearing loss in left ear   ? Heart disease   ? Hematuria   ? Hemorrhoids   ? History of hiatal hernia 12/14/2017  ? small    ? Hypercholesteremia   ? Lesion of bladder   ? Myocardial infarct [facility])   ? Presence of stent in coronary artery 11/09/2012  ? Rectal bleeding   ? Trigeminal neuralgia   ? Trigeminal neuralgia   ? Valvular heart disease   ? Vitamin D deficiency   ? ? ?Past Surgical History:  ?Procedure Laterality Date  ? APPENDECTOMY    ? BOTOX INJECTION N/A 12/21/2017  ? Procedure: Bladder BOTOX INJECTION;  Surgeon: [name], MD;  Location: [facility] ORS;  Service: Urology;  Laterality: N/A;  ? BOTOX INJECTION N/A 09/11/2018  ? Procedure: Bladder BOTOX INJECTION;  Surgeon: [name], MD;  Location: [facility] ORS;  Service: Urology;  Laterality: N/A;  ? CARDIAC CATHETERIZATION    ? CARDIAC CATHETERIZATION N/A 01/22/2015  ? Procedure: Left Heart Cath;  Surgeon: Corey Skains, MD;  Location: [place] CV LAB;  Service: Cardiovascular;  Laterality: N/A;  ? CARDIAC CATHETERIZATION N/A 01/22/2015  ? Procedure: Coronary Stent Intervention;  Surgeon: Isaias Cowman, MD;  Location: [place] CV LAB;  Service: Cardiovascular;  Laterality: N/A;  ? CATARACT EXTRACTION, BILATERAL    ? COLONOSCOPY WITH PROPOFOL N/A 12/08/2015  ? Procedure: COLONOSCOPY WITH PROPOFOL;  Surgeon: Lollie Sails, MD;  Location:  [place] ENDOSCOPY;  Service: Endoscopy;  Laterality: N/A;  ? COLONOSCOPY WITH PROPOFOL N/A 12/09/2015  ? Procedure: COLONOSCOPY WITH PROPOFOL;  Surgeon: Lollie Sails, MD;  Location[facility] ENDOSCOPY;  Service: Endoscopy;  Laterality: N/A;  ? CORONARY ANGIOPLASTY    ? 5 stents  ? CORONARY STENT PLACEMENT  2015  ? x5  ? CYSTOSCOPY N/A 09/11/2018  ? Procedure: CYSTOSCOPY;  Surgeon: [name], MD;  Location: [facility] ORS;  Service: Urology;  Laterality: N/A;  ? CYSTOSCOPY WITH BIOPSY N/A 12/21/2017  ? Procedure: CYSTOSCOPY WITH BIOPSY;  Surgeon: [name], MD;  Location: [facility] ORS;  Service: Urology;  Laterality: N/A;  ? ESOPHAGOGASTRODUODENOSCOPY (EGD) WITH PROPOFOL N/A 12/08/2015  ? Procedure: ESOPHAGOGASTRODUODENOSCOPY  (EGD) WITH PROPOFOL;  Surgeon: Lollie Sails, MD;  Location: [facility] ENDOSCOPY;  Service: Endoscopy;  Laterality: N/A;  ? ESOPHAGOGASTRODUODENOSCOPY (EGD) WITH PROPOFOL N/A 12/13/2017  ? Procedure: ESOPHAGOGASTRODUODENOSCOPY (EGD) WITH PROPOFOL;  Surgeon: Lollie Sails, MD;  Location[facility] ENDOSCOPY;  Service: Endoscopy;  Laterality: N/A;  ? EYE SURGERY    ? HERNIA REPAIR    ? kidney tumor remove    ? TRANSURETHRAL RESECTION OF BLADDER TUMOR WITH GYRUS (TURBT-GYRUS)  12/2013  ? UMBILICAL HERNIA REPAIR    ? urethral meatotomy    ? ? ?There were no vitals filed for this visit. ? ? Subjective Assessment - 07/09/21 0852   ? ? Subjective Patient reports he is feeling stiff and legs heavy today. .   ? Patient is accompained by: Family member   ? Pertinent History Patient is a 76 year old male with referral for Physical Therapy with diagnosis of Parkinsons disease and history of falling. He reports he diagnosed last year with Parkinsons and has improved his tremors with use of medication but reports worsening shuffling and difficulty with mobility. Patient has past medical history signifiant for Parkinsons; Arthritis, bladder cancer, Trigemic Neuralgia. He lives with wife in 1 level home and current using cane with reported recent fall.   ? Limitations Walking;Lifting;Standing;House hold activities   ? How long can you sit comfortably? No limits   ? How long can you stand comfortably? 15 min- limited due to back pain and foot numbess   ? How long can you walk comfortably? about 80 feet   ? Patient Stated Goals Improve my balance, be able to get up off the floor so I can maybe work in my garage again.   ? Currently in Pain? No/denies   ? ?  ?  ? ?  ? ? ? ?Interventions:  ? ?Therapeutic Exercises:  ? ?Progressive resistance on NuStep- (SPM > 50)  ?L0- 72mn ?L1 - 1 min ?L2- 2 min ?L3- 2 min ?L4-2 min ? ?Resistive Gait- 12.5lb using Matrix cable system ?X 5 forward ?X 5 backward (much more difficult with retro gait-  short step to technique but did improve with practice)  ? ?Sit to stand without UE Support x 10 reps- combo with overhead reaching BLE  ? ?Figure 8 around cones using gait- focusing on increasing step length and not shuffling.  ? ?Forward/backward/side step around 4 cones - back and forth multiple rounds. ? ?Education provided throughout session via VC/TC and demonstration to facilitate movement at target joints and correct muscle activation for all testing and exercises performed.  ? ? ? ? ? ? ? ? ? ? ? ? ? ? ? ? ? ? ? ? ? ? ? ? ? ? ? PT Education - 07/09/21 0854   ? ?  Education Details Exercise technique   ? Person(s) Educated Patient   ? Methods Explanation;Demonstration;Tactile cues;Verbal cues   ? Comprehension Verbalized understanding;Returned demonstration;Verbal cues required;Tactile cues required;Need further instruction   ? ?  ?  ? ?  ? ? ? PT Short Term Goals - 05/11/21 0812   ? ?  ? PT SHORT TERM GOAL #1  ? Title Pt will be independent with INITIAL  HEP in order to improve strength and balance in order to decrease fall risk and improve function at home and work.   ? Baseline 04/07/2021= No formal HEP in place. 05/11/2021= Patient reports doing pretty good- compliant with walking program and his stretching/strengthening.   ? Time 6   ? Period Weeks   ? Status Achieved   ? Target Date 05/19/21   ?  ? PT SHORT TERM GOAL #2  ? Title Pt will decrease 5TSTS by at least 3 seconds in order to demonstrate clinically significant improvement in LE strength.   ? Baseline 04/07/2021= 25 sec; 05/11/2021= 18.65 sec   ? Time 6   ? Period Weeks   ? Status Achieved   ? Target Date 06/30/21   ? ?  ?  ? ?  ? ? ? ? PT Long Term Goals - 07/01/21 0818   ? ?  ? PT LONG TERM GOAL #1  ? Title Pt will be independent with FINAL HEP in order to improve strength and balance in order to decrease fall risk and improve function at home and work.   ? Baseline 04/07/2021= No formal HEP in place. 07/01/2021=Patient reports performing walking and  some standing LE strengthening exercises as instructed and states no questions at this time.   ? Time 12   ? Period Weeks   ? Status Partially Met   ? Target Date 09/23/21   ?  ? PT LONG TERM GOAL #2  ? Title

## 2021-07-10 ENCOUNTER — Ambulatory Visit: Payer: Medicare HMO

## 2021-07-10 DIAGNOSIS — R269 Unspecified abnormalities of gait and mobility: Secondary | ICD-10-CM

## 2021-07-10 DIAGNOSIS — R2681 Unsteadiness on feet: Secondary | ICD-10-CM

## 2021-07-10 DIAGNOSIS — G8929 Other chronic pain: Secondary | ICD-10-CM

## 2021-07-10 DIAGNOSIS — M6281 Muscle weakness (generalized): Secondary | ICD-10-CM

## 2021-07-10 DIAGNOSIS — R2689 Other abnormalities of gait and mobility: Secondary | ICD-10-CM

## 2021-07-10 DIAGNOSIS — R262 Difficulty in walking, not elsewhere classified: Secondary | ICD-10-CM

## 2021-07-10 DIAGNOSIS — R278 Other lack of coordination: Secondary | ICD-10-CM

## 2021-07-10 NOTE — Therapy (Signed)
[facility] ?[place], [place], [address] ?Phone: [phone]   Fax:  [phone] ? ?Physical Therapy Treatment ? ?Patient Details  ?Name: ABE SCHOOLS ?MRN: [id number] ?Date of Birth: [birth year] ?Referring Provider (PT): Dr. Gurney Maxin ? ? ?Encounter Date: 07/10/2021 ? ? PT End of Session - 07/10/21 1006   ? ? Visit Number 29   ? Number of Visits 50   ? Date for PT Re-Evaluation 09/23/21   ? Authorization Time Period Initial Certification= 10/04/7617- 06/30/2021; PN on 07/07/3265; Recert 03/02/4578-9/98/3382   ? Progress Note Due on Visit 30   ? PT Start Time 0900   ? PT Stop Time 5053   ? PT Time Calculation (min) 45 min   ? Equipment Utilized During Treatment Gait belt   ? Activity Tolerance Patient tolerated treatment well   ? Behavior During Therapy [facility] for tasks assessed/performed   ? ?  ?  ? ?  ? ? ?Past Medical History:  ?Diagnosis Date  ? Anxiety   ? Arteriosclerosis of coronary artery 11/16/2011  ? Overview:  Stent 10/2011 stent rca 2015 with collaterals to lad which is chronically occluded   ? Benign enlargement of prostate   ? Benign essential HTN 06/11/2014  ? Benign prostatic hypertrophy without urinary obstruction 07/31/2014  ? Bilateral cataracts 05/16/2013  ? Overview:  Dr. [facility]    ? Bone spur of foot   ? Left  ? BP (high blood pressure) 11/09/2012  ? Cancer [facility])   ? skin (forehead) and bladder  ? Carotid artery narrowing 02/08/2014  ? Depression   ? Detrusor hypertrophy   ? Diabetes ([place])   ? Diabetes mellitus, type 2 ([place]) 12/12/2012  ? Diverticulosis   ? Dyspnea   ? Esophageal reflux   ? Esophageal reflux   ? Fothergill's neuralgia 08/07/2012  ? Overview:  [facility]   ? Gastritis   ? GERD (gastroesophageal reflux disease)   ? Headache   ? cluster headaches  ? 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 Hearing loss in left ear   ? Heart disease   ? Hematuria   ? Hemorrhoids   ? History of hiatal hernia 12/14/2017  ? small    ? Hypercholesteremia   ? Lesion of bladder   ? Myocardial infarct [facility])   ? Presence of stent in coronary artery 11/09/2012  ? Rectal bleeding   ? Trigeminal neuralgia   ? Trigeminal neuralgia   ? Valvular heart disease   ? Vitamin D deficiency   ? ? ?Past Surgical History:  ?Procedure Laterality Date  ? APPENDECTOMY    ? BOTOX INJECTION N/A 12/21/2017  ? Procedure: Bladder BOTOX INJECTION;  Surgeon: [name], MD;  Location: [facility] ORS;  Service: Urology;  Laterality: N/A;  ? BOTOX INJECTION N/A 09/11/2018  ? Procedure: Bladder BOTOX INJECTION;  Surgeon: [name], MD;  Location: [facility] ORS;  Service: Urology;  Laterality: N/A;  ? CARDIAC CATHETERIZATION    ? CARDIAC CATHETERIZATION N/A 01/22/2015  ? Procedure: Left Heart Cath;  Surgeon: Corey Skains, MD;  Location: [place] CV LAB;  Service: Cardiovascular;  Laterality: N/A;  ? CARDIAC CATHETERIZATION N/A 01/22/2015  ? Procedure: Coronary Stent Intervention;  Surgeon: Isaias Cowman, MD;  Location: [place] CV LAB;  Service: Cardiovascular;  Laterality: N/A;  ? CATARACT EXTRACTION, BILATERAL    ? COLONOSCOPY WITH PROPOFOL N/A 12/08/2015  ? Procedure: COLONOSCOPY WITH PROPOFOL;  Surgeon: Lollie Sails, MD;  Location:  [place] ENDOSCOPY;  Service: Endoscopy;  Laterality: N/A;  ? COLONOSCOPY WITH PROPOFOL N/A 12/09/2015  ? Procedure: COLONOSCOPY WITH PROPOFOL;  Surgeon: Lollie Sails, MD;  Location: [facility] ENDOSCOPY;  Service: Endoscopy;  Laterality: N/A;  ? CORONARY ANGIOPLASTY    ? 5 stents  ? CORONARY STENT PLACEMENT  2015  ? x5  ? CYSTOSCOPY N/A 09/11/2018  ? Procedure: CYSTOSCOPY;  Surgeon: [name], MD;  Location: [facility] ORS;  Service: Urology;  Laterality: N/A;  ? CYSTOSCOPY WITH BIOPSY N/A 12/21/2017  ? Procedure: CYSTOSCOPY WITH BIOPSY;  Surgeon: [name], MD;  Location: [facility] ORS;  Service: Urology;  Laterality: N/A;  ? ESOPHAGOGASTRODUODENOSCOPY (EGD) WITH PROPOFOL N/A 12/08/2015  ? Procedure: ESOPHAGOGASTRODUODENOSCOPY  (EGD) WITH PROPOFOL;  Surgeon: Lollie Sails, MD;  Location: [facility] ENDOSCOPY;  Service: Endoscopy;  Laterality: N/A;  ? ESOPHAGOGASTRODUODENOSCOPY (EGD) WITH PROPOFOL N/A 12/13/2017  ? Procedure: ESOPHAGOGASTRODUODENOSCOPY (EGD) WITH PROPOFOL;  Surgeon: Lollie Sails, MD;  Location: [facility] ENDOSCOPY;  Service: Endoscopy;  Laterality: N/A;  ? EYE SURGERY    ? HERNIA REPAIR    ? kidney tumor remove    ? TRANSURETHRAL RESECTION OF BLADDER TUMOR WITH GYRUS (TURBT-GYRUS)  12/2013  ? UMBILICAL HERNIA REPAIR    ? urethral meatotomy    ? ? ?There were no vitals filed for this visit. ? ? Subjective Assessment - 07/10/21 1005   ? ? Subjective Patient reports feeling very stiff and tired today.   ? Patient is accompained by: Family member   ? Pertinent History Patient is a 76 year old male with referral for Physical Therapy with diagnosis of Parkinsons disease and history of falling. He reports he diagnosed last year with Parkinsons and has improved his tremors with use of medication but reports worsening shuffling and difficulty with mobility. Patient has past medical history signifiant for Parkinsons; Arthritis, bladder cancer, Trigemic Neuralgia. He lives with wife in 1 level home and current using cane with reported recent fall.   ? Limitations Walking;Lifting;Standing;House hold activities   ? How long can you sit comfortably? No limits   ? How long can you stand comfortably? 15 min- limited due to back pain and foot numbess   ? How long can you walk comfortably? about 80 feet   ? Patient Stated Goals Improve my balance, be able to get up off the floor so I can maybe work in my garage again.   ? Currently in Pain? No/denies   ? ?  ?  ? ?  ? ?INTERVENTIONS:  ? Therapuetic Exercises:  ?Nustep Interval training: while added moist heat to low back ?  ?L1- 1 min ?L4-1 min ?L1- 1 min ?L4-1 min ?L1- 34mn ?L4- 169m ?L1- 1 min ?L4- 1 min ?Total 60m57m RPE =6/10; total distance = 0.21 mi ?  ? ?Thoracic rotation- Open/closed  book - in sidelye x 15 reps each side.  ?Supine bridge x 15 reps ?Sit to stand without UE support x 15 reps ?Cervical rotation with use of towel - Hold 20 sec going to left x 4.  ? ?Manual Therapy:  ? ?PROM to BLE/Trunk: ?-B hamstring ?B piriformis ?B SKC ?B lower trunk rotation ?Access Code: [email or web address]: [email or web address] ?Date: 07/10/2021 ?Prepared by: JefSande Brothers?Exercises ?- Seated Assisted Cervical Rotation with Towel  - 1 x daily - 3 sets - 20 sec hold ? ? ?Education provided throughout session via VC/TC and demonstration to facilitate movement at target joints and correct muscle activation for all testing and exercises performed.  ? ? ? ? ? ? ? ? ? ? ? ? ? ? ? ? ? ? ? ? ? ? ? ? ? ?  PT Education - 07/10/21 1006   ? ? Education Details Exercise technique; HEP   ? Person(s) Educated Patient   ? Methods Explanation;Demonstration;Tactile cues;Verbal cues;Handout   ? Comprehension Verbalized understanding;Returned demonstration;Verbal cues required;Tactile cues required;Need further instruction   ? ?  ?  ? ?  ? ? ? PT Short Term Goals - 05/11/21 0812   ? ?  ? PT SHORT TERM GOAL #1  ? Title Pt will be independent with INITIAL  HEP in order to improve strength and balance in order to decrease fall risk and improve function at home and work.   ? Baseline 04/07/2021= No formal HEP in place. 05/11/2021= Patient reports doing pretty good- compliant with walking program and his stretching/strengthening.   ? Time 6   ? Period Weeks   ? Status Achieved   ? Target Date 05/19/21   ?  ? PT SHORT TERM GOAL #2  ? Title Pt will decrease 5TSTS by at least 3 seconds in order to demonstrate clinically significant improvement in LE strength.   ? Baseline 04/07/2021= 25 sec; 05/11/2021= 18.65 sec   ? Time 6   ? Period Weeks   ? Status Achieved   ? Target Date 06/30/21   ? ?  ?  ? ?  ? ? ? ? PT Long Term Goals - 07/01/21 0818   ? ?  ? PT LONG TERM GOAL #1  ? Title Pt will be independent with FINAL HEP in order  to improve strength and balance in order to decrease fall risk and improve function at home and work.   ? Baseline 04/07/2021= No formal HEP in place. 07/01/2021=Patient reports performing walking and some st

## 2021-07-14 ENCOUNTER — Ambulatory Visit: Payer: Medicare HMO

## 2021-07-14 DIAGNOSIS — R269 Unspecified abnormalities of gait and mobility: Secondary | ICD-10-CM

## 2021-07-14 DIAGNOSIS — R2689 Other abnormalities of gait and mobility: Secondary | ICD-10-CM

## 2021-07-14 DIAGNOSIS — G8929 Other chronic pain: Secondary | ICD-10-CM

## 2021-07-14 DIAGNOSIS — R2681 Unsteadiness on feet: Secondary | ICD-10-CM

## 2021-07-14 DIAGNOSIS — R278 Other lack of coordination: Secondary | ICD-10-CM

## 2021-07-14 DIAGNOSIS — M6281 Muscle weakness (generalized): Secondary | ICD-10-CM

## 2021-07-14 DIAGNOSIS — R262 Difficulty in walking, not elsewhere classified: Secondary | ICD-10-CM

## 2021-07-14 NOTE — Therapy (Signed)
[place] [facility] ?[place], [place], [address] ?Phone: [phone]   Fax:  [phone] ? ?Physical Therapy Treatment ? ?Patient Details  ?Name: [name] ?MRN: [id number] ?Date of Birth: 1945-06-05 ?Referring Provider (PT): Dr. Gurney Maxin ? ? ?Encounter Date: 07/14/2021 ? ? PT End of Session - 07/14/21 0815   ? ? Visit Number 30   ? Number of Visits 50   ? Date for PT Re-Evaluation 09/23/21   ? Authorization Time Period Initial Certification= 06/06/8889- 06/30/2021; PN on 6/94/5038; Recert 10/07/2798-3/49/1791   ? Progress Note Due on Visit 30   ? PT Start Time [phone]   ? PT Stop Time 0844   ? PT Time Calculation (min) 34 min   ? Equipment Utilized During Treatment Gait belt   ? Activity Tolerance Patient tolerated treatment well   ? Behavior During Therapy [facility] for tasks assessed/performed   ? ?  ?  ? ?  ? ? ?Past Medical History:  ?Diagnosis Date  ? Anxiety   ? Arteriosclerosis of coronary artery 11/16/2011  ? Overview:  Stent 10/2011 stent rca 2015 with collaterals to lad which is chronically occluded   ? Benign enlargement of prostate   ? Benign essential HTN 06/11/2014  ? Benign prostatic hypertrophy without urinary obstruction 07/31/2014  ? Bilateral cataracts 05/16/2013  ? Overview:  [facility]    ? Bone spur of foot   ? Left  ? BP (high blood pressure) 11/09/2012  ? Cancer [facility])   ? skin (forehead) and bladder  ? Carotid artery narrowing 02/08/2014  ? Depression   ? Detrusor hypertrophy   ? Diabetes ([place])   ? Diabetes mellitus, type 2 ([place]) 12/12/2012  ? Diverticulosis   ? Dyspnea   ? Esophageal reflux   ? Esophageal reflux   ? Fothergill's neuralgia 08/07/2012  ? Overview:  [facility]   ? Gastritis   ? GERD (gastroesophageal reflux disease)   ? Headache   ? cluster headaches  ? 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 Hearing loss in left ear   ? Heart disease   ? Hematuria   ? Hemorrhoids   ? History of hiatal hernia 12/14/2017  ? small    ? Hypercholesteremia   ? Lesion of bladder   ? Myocardial infarct [facility])   ? Presence of stent in coronary artery 11/09/2012  ? Rectal bleeding   ? Trigeminal neuralgia   ? Trigeminal neuralgia   ? Valvular heart disease   ? Vitamin D deficiency   ? ? ?Past Surgical History:  ?Procedure Laterality Date  ? APPENDECTOMY    ? BOTOX INJECTION N/A 12/21/2017  ? Procedure: Bladder BOTOX INJECTION;  Surgeon: [name], MD;  Location: [facility] ORS;  Service: Urology;  Laterality: N/A;  ? BOTOX INJECTION N/A 09/11/2018  ? Procedure: Bladder BOTOX INJECTION;  Surgeon: [name], MD;  Location: [facility] ORS;  Service: Urology;  Laterality: N/A;  ? CARDIAC CATHETERIZATION    ? CARDIAC CATHETERIZATION N/A 01/22/2015  ? Procedure: Left Heart Cath;  Surgeon: Corey Skains, MD;  Location: [place] CV LAB;  Service: Cardiovascular;  Laterality: N/A;  ? CARDIAC CATHETERIZATION N/A 01/22/2015  ? Procedure: Coronary Stent Intervention;  Surgeon: Isaias Cowman, MD;  Location: [place] CV LAB;  Service: Cardiovascular;  Laterality: N/A;  ? CATARACT EXTRACTION, BILATERAL    ? COLONOSCOPY WITH PROPOFOL N/A 12/08/2015  ? Procedure: COLONOSCOPY WITH PROPOFOL;  Surgeon: Lollie Sails, MD;  Location:  [place] ENDOSCOPY;  Service: Endoscopy;  Laterality: N/A;  ? COLONOSCOPY WITH PROPOFOL N/A 12/09/2015  ? Procedure: COLONOSCOPY WITH PROPOFOL;  Surgeon: Lollie Sails, MD;  Location: [facility] ENDOSCOPY;  Service: Endoscopy;  Laterality: N/A;  ? CORONARY ANGIOPLASTY    ? 5 stents  ? CORONARY STENT PLACEMENT  2015  ? x5  ? CYSTOSCOPY N/A 09/11/2018  ? Procedure: CYSTOSCOPY;  Surgeon: [name], MD;  Location: [facility] ORS;  Service: Urology;  Laterality: N/A;  ? CYSTOSCOPY WITH BIOPSY N/A 12/21/2017  ? Procedure: CYSTOSCOPY WITH BIOPSY;  Surgeon: [name], MD;  Location: [facility] ORS;  Service: Urology;  Laterality: N/A;  ? ESOPHAGOGASTRODUODENOSCOPY (EGD) WITH PROPOFOL N/A 12/08/2015  ? Procedure: ESOPHAGOGASTRODUODENOSCOPY  (EGD) WITH PROPOFOL;  Surgeon: Lollie Sails, MD;  Location: [facility] ENDOSCOPY;  Service: Endoscopy;  Laterality: N/A;  ? ESOPHAGOGASTRODUODENOSCOPY (EGD) WITH PROPOFOL N/A 12/13/2017  ? Procedure: ESOPHAGOGASTRODUODENOSCOPY (EGD) WITH PROPOFOL;  Surgeon: Lollie Sails, MD;  Location: [facility] ENDOSCOPY;  Service: Endoscopy;  Laterality: N/A;  ? EYE SURGERY    ? HERNIA REPAIR    ? kidney tumor remove    ? TRANSURETHRAL RESECTION OF BLADDER TUMOR WITH GYRUS (TURBT-GYRUS)  12/2013  ? UMBILICAL HERNIA REPAIR    ? urethral meatotomy    ? ? ?There were no vitals filed for this visit. ? ? Subjective Assessment - 07/14/21 0810   ? ? Subjective .Patient was on phone upon arrival dealing with some home issues. Reports he is still soreness/tired from his workout on Friday.   ? Patient is accompained by: Family member   ? Pertinent History Patient is a 76 year old male with referral for Physical Therapy with diagnosis of Parkinsons disease and history of falling. He reports he diagnosed last year with Parkinsons and has improved his tremors with use of medication but reports worsening shuffling and difficulty with mobility. Patient has past medical history signifiant for Parkinsons; Arthritis, bladder cancer, Trigemic Neuralgia. He lives with wife in 1 level home and current using cane with reported recent fall.   ? Limitations Walking;Lifting;Standing;House hold activities   ? How long can you sit comfortably? No limits   ? How long can you stand comfortably? 15 min- limited due to back pain and foot numbess   ? How long can you walk comfortably? about 80 feet   ? Patient Stated Goals Improve my balance, be able to get up off the floor so I can maybe work in my garage again.   ? Currently in Pain? No/denies   ? Aggravating Factors  prolonged standing/walking   ? Pain Relieving Factors Rest   ? Effect of Pain on Daily Activities Difficulty with standing/ADL's and walking in community   ? ?  ?  ? ?  ? ?INTERVENTIONS:   ? ?*Patient was 10 min late due to having a phone call upon arrival with house issues.  ? ?Therapeutic Exercises:  ? ?Nustep L0 for 6 min - LE only Rates at 2/10 ? ?Cervical ROM: Instructed in cervical AROM- Flex, Ext, Rotation, Sidebending 2 sets of 10 reps.  ? ?Cervical Sidebending- Stretch- hold 20-30 sec each side x 3  ? ? ?Seated LE strengthening: ? ?Hip march- 4lb AW  alt LE x 12 reps  ?Knee ext 4lb AW alt LE x 12 reps ?Calf raises 4lb AW BLE x 12 reps ?Hip abd 4lb AW BLE x 12 reps ? ?Education provided throughout session via VC/TC and demonstration to facilitate movement at target joints and correct muscle activation  for all testing and exercises performed.  ? ? ? ? ?Access Code: [email or web address] ?URL: [email or web address] ?Date: 07/14/2021 ?Prepared by: Sande Brothers ? ?Exercises ?- Seated Assisted Cervical Rotation with Towel  - 1 x daily - 3 sets - 20 sec hold ?- Seated Cervical Sidebending AROM  - 1 x daily - 3 sets - 10 reps ?- Seated Cervical Rotation AROM  - 1 x daily - 3 sets - 10 reps ?- Seated Cervical Flexion AROM  - 1 x daily - 3 sets - 10 reps ?- Seated Cervical Extension AROM  - 1 x daily - 3 sets - 10 reps ?- Seated Cervical Sidebending Stretch  - 1 x daily - 3 sets - 20-30 sec hold ? ? ? ? ? ? ? ? ? ? ? ? ? ? ? ? PT Education - 07/14/21 0815   ? ? Education Details Exercise technique   ? Person(s) Educated Patient   ? Methods Explanation;Demonstration;Tactile cues;Verbal cues   ? Comprehension Verbalized understanding;Returned demonstration;Verbal cues required;Tactile cues required;Need further instruction   ? ?  ?  ? ?  ? ? ? PT Short Term Goals - 05/11/21 0812   ? ?  ? PT SHORT TERM GOAL #1  ? Title Pt will be independent with INITIAL  HEP in order to improve strength and balance in order to decrease fall risk and improve function at home and work.   ? Baseline 04/07/2021= No formal HEP in place. 05/11/2021= Patient reports doing pretty good- compliant with walking program and his  stretching/strengthening.   ? Time 6   ? Period Weeks   ? Status Achieved   ? Target Date 05/19/21   ?  ? PT SHORT TERM GOAL #2  ? Title Pt will decrease 5TSTS by at least 3 seconds in order to demonstrate cl

## 2021-07-16 ENCOUNTER — Ambulatory Visit: Payer: Medicare HMO

## 2021-07-16 DIAGNOSIS — M6281 Muscle weakness (generalized): Secondary | ICD-10-CM

## 2021-07-16 DIAGNOSIS — R278 Other lack of coordination: Secondary | ICD-10-CM

## 2021-07-16 DIAGNOSIS — R262 Difficulty in walking, not elsewhere classified: Secondary | ICD-10-CM

## 2021-07-16 DIAGNOSIS — R269 Unspecified abnormalities of gait and mobility: Secondary | ICD-10-CM

## 2021-07-16 DIAGNOSIS — R2681 Unsteadiness on feet: Secondary | ICD-10-CM

## 2021-07-16 DIAGNOSIS — M545 Low back pain, unspecified: Secondary | ICD-10-CM

## 2021-07-16 DIAGNOSIS — R2689 Other abnormalities of gait and mobility: Secondary | ICD-10-CM

## 2021-07-16 NOTE — Therapy (Signed)
[place] MAIN [facility] SERVICES [address] [place], [place], [address] Phone: [phone]   Fax:  [phone]  Physical Therapy Treatment  Patient Details  Name: [name] MRN: [id number] Date of Birth: [birth year] Referring Provider (PT): Dr. Gurney Maxin   Encounter Date: 07/16/2021   PT End of Session - 07/16/21 0854     Visit Number 31    Number of Visits 48    Date for PT Re-Evaluation 09/23/21    Authorization Time Period Initial Certification= 09/01/812- 06/30/2021; PN on 4/81/8563; Recert 03/04/9700-6/37/8588    Progress Note Due on Visit 30    PT Start Time 0849    PT Stop Time 0929    PT Time Calculation (min) 40 min    Equipment Utilized During Treatment Gait belt    Activity Tolerance Patient tolerated treatment well    Behavior During Therapy [facility] for tasks assessed/performed             Past Medical History:  Diagnosis Date   Anxiety    Arteriosclerosis of coronary artery 11/16/2011   Overview:  Stent 10/2011 stent rca 2015 with collaterals to lad which is chronically occluded    Benign enlargement of prostate    Benign essential HTN 06/11/2014   Benign prostatic hypertrophy without urinary obstruction 07/31/2014   Bilateral cataracts 05/16/2013   Overview:  Dr. [facility]     Bone spur of foot    Left   BP (high blood pressure) 11/09/2012   Cancer ([place])    skin (forehead) and bladder   Carotid artery narrowing 02/08/2014   Depression    Detrusor hypertrophy    Diabetes ([place])    Diabetes mellitus, type 2 ([place]) 12/12/2012   Diverticulosis    Dyspnea    Esophageal reflux    Esophageal reflux    Fothergill's neuralgia 08/07/2012   Overview:  [facility]    Gastritis    GERD (gastroesophageal reflux disease)    Headache    cluster headaches   Healed myocardial infarct 11/09/2012   Hearing loss in left ear    Heart disease    Hematuria    Hemorrhoids    History of hiatal hernia 12/14/2017   small     Hypercholesteremia    Lesion of bladder    Myocardial infarct (HCC)    Presence of stent in coronary artery 11/09/2012   Rectal bleeding    Trigeminal neuralgia    Trigeminal neuralgia    Valvular heart disease    Vitamin D deficiency     Past Surgical History:  Procedure Laterality Date   APPENDECTOMY     BOTOX INJECTION N/A 12/21/2017   Procedure: Bladder BOTOX INJECTION;  Surgeon: [name], MD;  Location: [facility] ORS;  Service: Urology;  Laterality: N/A;   BOTOX INJECTION N/A 09/11/2018   Procedure: Bladder BOTOX INJECTION;  Surgeon: [name], MD;  Location: [facility] ORS;  Service: Urology;  Laterality: N/A;   CARDIAC CATHETERIZATION     CARDIAC CATHETERIZATION N/A 01/22/2015   Procedure: Left Heart Cath;  Surgeon: Corey Skains, MD;  Location: [place] [facility];  Service: Cardiovascular;  Laterality: N/A;   CARDIAC CATHETERIZATION N/A 01/22/2015   Procedure: Coronary Stent Intervention;  Surgeon: Isaias Cowman, MD;  Location: [place] [facility];  Service: Cardiovascular;  Laterality: N/A;   CATARACT EXTRACTION, BILATERAL     COLONOSCOPY WITH PROPOFOL N/A 12/08/2015   Procedure: COLONOSCOPY WITH PROPOFOL;  Surgeon: Lollie Sails, MD;  Location:  [place] ENDOSCOPY;  Service: Endoscopy;  Laterality: N/A;   COLONOSCOPY WITH PROPOFOL N/A 12/09/2015   Procedure: COLONOSCOPY WITH PROPOFOL;  Surgeon: Lollie Sails, MD;  Location: [facility] ENDOSCOPY;  Service: Endoscopy;  Laterality: N/A;   CORONARY ANGIOPLASTY     5 stents   CORONARY STENT PLACEMENT  2015   x5   CYSTOSCOPY N/A 09/11/2018   Procedure: CYSTOSCOPY;  Surgeon: [name], MD;  Location: [facility] ORS;  Service: Urology;  Laterality: N/A;   CYSTOSCOPY WITH BIOPSY N/A 12/21/2017   Procedure: CYSTOSCOPY WITH BIOPSY;  Surgeon: [name], MD;  Location: [facility] ORS;  Service: Urology;  Laterality: N/A;   ESOPHAGOGASTRODUODENOSCOPY (EGD) WITH PROPOFOL N/A 12/08/2015   Procedure: ESOPHAGOGASTRODUODENOSCOPY  (EGD) WITH PROPOFOL;  Surgeon: Lollie Sails, MD;  Location: [facility] ENDOSCOPY;  Service: Endoscopy;  Laterality: N/A;   ESOPHAGOGASTRODUODENOSCOPY (EGD) WITH PROPOFOL N/A 12/13/2017   Procedure: ESOPHAGOGASTRODUODENOSCOPY (EGD) WITH PROPOFOL;  Surgeon: Lollie Sails, MD;  Location: [facility] ENDOSCOPY;  Service: Endoscopy;  Laterality: N/A;   EYE SURGERY     HERNIA REPAIR     kidney tumor remove     TRANSURETHRAL RESECTION OF BLADDER TUMOR WITH GYRUS (TURBT-GYRUS)  93/2671   UMBILICAL HERNIA REPAIR     urethral meatotomy      There were no vitals filed for this visit.   Subjective Assessment - 07/16/21 0853     Subjective Patient reports feeling tired and worn out today.    Patient is accompained by: Family member    Pertinent History Patient is a 76 year old male with referral for Physical Therapy with diagnosis of Parkinsons disease and history of falling. He reports he diagnosed last year with Parkinsons and has improved his tremors with use of medication but reports worsening shuffling and difficulty with mobility. Patient has past medical history signifiant for Parkinsons; Arthritis, bladder cancer, Trigemic Neuralgia. He lives with wife in 1 level home and current using cane with reported recent fall.    Limitations Walking;Lifting;Standing;House hold activities    How long can you sit comfortably? No limits    How long can you stand comfortably? 15 min- limited due to back pain and foot numbess    How long can you walk comfortably? about 80 feet    Patient Stated Goals Improve my balance, be able to get up off the floor so I can maybe work in my garage again.    Currently in Pain? No/denies             INTERVENTIONS:     Therapeutic Exercises:    Progressive resistance on NuStep- (SPM > 50) for cardiovascular response and muscle strengthening L0- 97mn L1 - 1 min L2- 1 min L3- 1 min L4-1  min L0- 1 min  Sit to stand without UE Support x 15  reps- combo with  overhead reaching BLE    Neuromuscular re-ed:   Standing staggered using 2 airex pad- performing trunk twist with Arms out wide - Twist to left then right with hands clapping x 12 reps each side.    Standing in // bars on blue airex pad - tray table in front and another to left -6 cones on table - patient performed trunk twist moving cones from front to side table using opp UE to perform more trunk twist x 6 cones x 2 sets each direction.   Heel to toe gait activity- Heel strike onto 1/2 foam and back to toe off x 20 reps each direction  Forward/backward step over  1/2 foam with 1 UE support focusing on clearing foam. Patient performed well with minimal difficulty only with retro steps.   Education provided throughout session via VC/TC and demonstration to facilitate movement at target joints and correct muscle activation for all testing and exercises performed.                 PT Education - 07/16/21 0854     Education Details Exercise technique    Person(s) Educated Patient    Methods Explanation;Demonstration;Tactile cues;Verbal cues    Comprehension Verbalized understanding;Returned demonstration;Verbal cues required;Tactile cues required;Need further instruction              PT Short Term Goals - 05/11/21 0301       PT SHORT TERM GOAL #1   Title Pt will be independent with INITIAL  HEP in order to improve strength and balance in order to decrease fall risk and improve function at home and work.    Baseline 04/07/2021= No formal HEP in place. 05/11/2021= Patient reports doing pretty good- compliant with walking program and his stretching/strengthening.    Time 6    Period Weeks    Status Achieved    Target Date 05/19/21      PT SHORT TERM GOAL #2   Title Pt will decrease 5TSTS by at least 3 seconds in order to demonstrate clinically significant improvement in LE strength.    Baseline 04/07/2021= 25 sec; 05/11/2021= 18.65 sec    Time 6    Period Weeks    Status  Achieved    Target Date 06/30/21               PT Long Term Goals - 07/01/21 0818       PT LONG TERM GOAL #1   Title Pt will be independent with FINAL HEP in order to improve strength and balance in order to decrease fall risk and improve function at home and work.    Baseline 04/07/2021= No formal HEP in place. 07/01/2021=Patient reports performing walking and some standing LE strengthening exercises as instructed and states no questions at this time.    Time 12    Period Weeks    Status Partially Met    Target Date 09/23/21      PT LONG TERM GOAL #2   Title Pt will improve FOTO to target score of 45% to display perceived improvements in ability to complete ADL's.    Baseline 2/7/22023= 41%; FOTO=53%    Time 12    Period Weeks    Status Achieved    Target Date 06/30/21      PT LONG TERM GOAL #3   Title Pt will decrease 5TSTS by at least 5 seconds in order to demonstrate clinically significant improvement in LE strength.    Baseline 04/07/2021= 25 sec; 05/11/2021= 18.65 sec without UE support; 4/10= 13.76 sec without UE support    Time 12    Period Weeks    Status Achieved    Target Date 06/30/21      PT LONG TERM GOAL #4   Title Pt will decrease TUG to below 17 seconds/decrease in order to demonstrate decreased fall risk.    Baseline 04/07/2021= 21 sec with SPC; 05/11/2021= 18.85 sec with use of cane; 07/21/2021=18.88 sec  avg with use of cane    Time 12    Period Weeks    Status Partially Met    Target Date 09/23/21      PT LONG TERM GOAL #5  Title Pt will increase 10MWT by at least 0.15 m/s in order to demonstrate clinically significant improvement in community ambulation.    Baseline 04/07/2021= 0.56 m/s using SPC; 05/11/2021= 0.54 m/s using SPC; 07/01/2021= 0.6 m/s    Time 12    Period Weeks    Status New    Target Date 09/23/21      PT LONG TERM GOAL #6   Title Pt will increase 6MWT by at least 24m(1644f in order to demonstrate clinically significant improvement in  cardiopulmonary endurance and community ambulation    Baseline 06/08/2021= 600 ft. in 5 min- Stopped due to fatigue using 4WW; 07/01/2021= 665 in 5 min 45 sec using 4WW    Time 4    Period Weeks    Status On-going    Target Date 09/23/21                   Plan - 07/16/21 0855     Clinical Impression Statement Patient presented with excellent motivation but reported feeling tired today. He was challenged today with activities involving trunk twisting with some difficulty including increased overall tightness. He was more overall fatigued today and having some difficulty with lifting LE - especially retro steps. Patient will benefit from skilled PT services to address these deficits, improve balance, and decrease risk for future falls    Personal Factors and Comorbidities Comorbidity 3+    Comorbidities arthritis, bladder cancer, Trigeminal Neuralgia    Examination-Activity Limitations Caring for Others;Carry;Continence;Lift;Squat;Stairs;Stand    Examination-Participation Restrictions Community Activity;Yard Work    [employer] officervolving/Moderate complexity    Rehab Potential Good    PT Frequency 2x / week    PT Duration 12 weeks    PT Treatment/Interventions ADLs/Self Care Home Management;Cryotherapy;Canalith Repostioning;Electrical Stimulation;Moist Heat;DME Instruction;Gait training;Stair training;Functional mobility training;Therapeutic activities;Therapeutic exercise;Balance training;Neuromuscular re-education;Patient/family education;Manual techniques;Passive range of motion;Dry needling;Vestibular    PT Next Visit Plan LE strength, improve step length with ambulation, muscle tissue lengthening    PT Home Exercise Plan Access Code: [email or web address]             Patient will benefit from skilled therapeutic intervention in order to improve the following deficits and impairments:  Abnormal gait, Decreased activity tolerance, Decreased balance, Decreased  coordination, Decreased endurance, Decreased mobility, Decreased range of motion, Decreased strength, Difficulty walking, Hypomobility, Impaired flexibility  Visit Diagnosis: Abnormality of gait and mobility  Difficulty in walking, not elsewhere classified  Muscle weakness (generalized)  Other lack of coordination  Other abnormalities of gait and mobility  Unsteadiness on feet  Chronic bilateral low back pain without sciatica     Problem List Patient Active Problem List   Diagnosis Date Noted   Class 2 obesity due to excess calories with body mass index (BMI) of 36.0 to 36.9 in adult 04/11/2020   Swelling of limb 03/28/2020   Lymphedema 03/28/2020   Trigeminal neuralgia 12/25/2019   Lower limb ulcer, calf, left, limited to breakdown of skin ([place]10/26/2021   OSA (obstructive sleep apnea) 1155/73/2202 Acute systolic CHF (congestive heart failure) ([place]10/13/2020   Bruising 07/10/2018   Diet-controlled type 2 diabetes mellitus ([place]in01/24/2020   Microalbuminuria 03/24/2018   Dizziness 11/17/2017   Simple chronic bronchitis ([facility]07/25/2019   SOBOE (shortness of breath on exertion) 02/18/2017   Chronic midline low back pain without sciatica 12/29/2016   Periodic limb movement disorder 12/29/2016   Benign essential tremor 06/22/2016   Pure hypercholesterolemia 06/20/2015   Erectile dysfunction due to arterial insufficiency  06/16/2015   Unstable angina ([place]) 01/22/2015   Abdominal aortic 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 rupture ([place]) 12/31/2014   Benign prostatic hypertrophy without urinary obstruction 07/31/2014   Enlarged prostate 07/31/2014   Benign essential HTN 06/11/2014   Carotid artery narrowing 02/08/2014   Carotid artery obstruction 02/08/2014   Bilateral carotid artery stenosis 02/08/2014   Chest pain 08/20/2013   Bilateral cataracts 05/16/2013   Cataract 05/16/2013   Clinical depression 12/12/2012   Diabetes mellitus, type 2 ([place]) 12/12/2012   Combined fat and  carbohydrate induced hyperlipemia 12/12/2012   Major depressive disorder, single episode, unspecified 12/12/2012   Acid reflux 11/09/2012   Presence of stent in coronary artery 11/09/2012   BP (high blood pressure) 11/09/2012   Healed myocardial infarct 11/09/2012   Gastro-esophageal reflux disease without esophagitis 11/09/2012   History of cardiovascular surgery 11/09/2012   Fothergill's neuralgia 08/07/2012   Swelling of testicle 04/06/2012   Disorder of male genital organ 04/06/2012   Swelling of the testicles 04/06/2012   Arteriosclerosis of coronary artery 11/16/2011   CAD in native artery 11/16/2011   Fatigue 06/04/2011   Avitaminosis D 11/30/2010  Rationale for Evaluation and Treatment Rehabilitation   Lewis Moccasin, PT 07/16/2021, 8:23 PM  [place], [place], [address] Phone: [phone]   Fax:  [phone]  Name: [name] MRN: [id number] Date of Birth: April 20, 1945

## 2021-07-20 ENCOUNTER — Ambulatory Visit: Payer: Medicare HMO

## 2021-07-20 ENCOUNTER — Encounter: Payer: Self-pay | Admitting: Urology

## 2021-07-20 ENCOUNTER — Ambulatory Visit: Payer: Medicare HMO | Admitting: Urology

## 2021-07-20 VITALS — BP 114/66 | HR 72

## 2021-07-20 DIAGNOSIS — R269 Unspecified abnormalities of gait and mobility: Secondary | ICD-10-CM | POA: Diagnosis not present

## 2021-07-20 DIAGNOSIS — R2689 Other abnormalities of gait and mobility: Secondary | ICD-10-CM

## 2021-07-20 DIAGNOSIS — G8929 Other chronic pain: Secondary | ICD-10-CM

## 2021-07-20 DIAGNOSIS — M6281 Muscle weakness (generalized): Secondary | ICD-10-CM

## 2021-07-20 DIAGNOSIS — R278 Other lack of coordination: Secondary | ICD-10-CM

## 2021-07-20 DIAGNOSIS — R2681 Unsteadiness on feet: Secondary | ICD-10-CM

## 2021-07-20 DIAGNOSIS — N3946 Mixed incontinence: Secondary | ICD-10-CM

## 2021-07-20 DIAGNOSIS — R262 Difficulty in walking, not elsewhere classified: Secondary | ICD-10-CM

## 2021-07-20 NOTE — Therapy (Signed)
[place] MAIN [facility] SERVICES [address] [place], [place], [address] Phone: [phone]   Fax:  [phone]  Physical Therapy Treatment  Patient Details  Name: [name] MRN: [id number] Date of Birth: August 17, 1945 Referring Provider (PT): Dr. Gurney Maxin   Encounter Date: 07/20/2021   PT End of Session - 07/20/21 1649     Visit Number 32    Number of Visits 50    Date for PT Re-Evaluation 09/23/21    Authorization Time Period Initial Certification= 10/07/2117- 06/30/2021; PN on 06/16/4079; Recert 06/03/8183-6/31/4970    Progress Note Due on Visit 26    PT Start Time 1600    PT Stop Time 2637    PT Time Calculation (min) 45 min    Equipment Utilized During Treatment Gait belt    Activity Tolerance Patient tolerated treatment well    Behavior During Therapy [facility] for tasks assessed/performed             Past Medical History:  Diagnosis Date   Anxiety    Arteriosclerosis of coronary artery 11/16/2011   Overview:  Stent 10/2011 stent rca 2015 with collaterals to lad which is chronically occluded    Benign enlargement of prostate    Benign essential HTN 06/11/2014   Benign prostatic hypertrophy without urinary obstruction 07/31/2014   Bilateral cataracts 05/16/2013   Overview:  Dr. [facility]     Bone spur of foot    Left   BP (high blood pressure) 11/09/2012   Cancer ([place])    skin (forehead) and bladder   Carotid artery narrowing 02/08/2014   Depression    Detrusor hypertrophy    Diabetes ([place])    Diabetes mellitus, type 2 ([place]) 12/12/2012   Diverticulosis    Dyspnea    Esophageal reflux    Esophageal reflux    Fothergill's neuralgia 08/07/2012   Overview:  [facility]    Gastritis    GERD (gastroesophageal reflux disease)    Headache    cluster headaches   Healed myocardial infarct 11/09/2012   Hearing loss in left ear    Heart disease    Hematuria    Hemorrhoids    History of hiatal hernia 12/14/2017   small     Hypercholesteremia    Lesion of bladder    Myocardial infarct (HCC)    Presence of stent in coronary artery 11/09/2012   Rectal bleeding    Trigeminal neuralgia    Trigeminal neuralgia    Valvular heart disease    Vitamin D deficiency     Past Surgical History:  Procedure Laterality Date   APPENDECTOMY     BOTOX INJECTION N/A 12/21/2017   Procedure: Bladder BOTOX INJECTION;  Surgeon: [name], MD;  Location: [facility] ORS;  Service: Urology;  Laterality: N/A;   BOTOX INJECTION N/A 09/11/2018   Procedure: Bladder BOTOX INJECTION;  Surgeon: [name], MD;  Location: [facility] ORS;  Service: Urology;  Laterality: N/A;   CARDIAC CATHETERIZATION     CARDIAC CATHETERIZATION N/A 01/22/2015   Procedure: Left Heart Cath;  Surgeon: Corey Skains, MD;  Location: [place] CV LAB;  Service: Cardiovascular;  Laterality: N/A;   CARDIAC CATHETERIZATION N/A 01/22/2015   Procedure: Coronary Stent Intervention;  Surgeon: Isaias Cowman, MD;  Location: [place] CV LAB;  Service: Cardiovascular;  Laterality: N/A;   CATARACT EXTRACTION, BILATERAL     COLONOSCOPY WITH PROPOFOL N/A 12/08/2015   Procedure: COLONOSCOPY WITH PROPOFOL;  Surgeon: Lollie Sails, MD;  Location:  [place] ENDOSCOPY;  Service: Endoscopy;  Laterality: N/A;   COLONOSCOPY WITH PROPOFOL N/A 12/09/2015   Procedure: COLONOSCOPY WITH PROPOFOL;  Surgeon: Lollie Sails, MD;  Location: [facility] ENDOSCOPY;  Service: Endoscopy;  Laterality: N/A;   CORONARY ANGIOPLASTY     5 stents   CORONARY STENT PLACEMENT  2015   x5   CYSTOSCOPY N/A 09/11/2018   Procedure: CYSTOSCOPY;  Surgeon: [name], MD;  Location: [facility] ORS;  Service: Urology;  Laterality: N/A;   CYSTOSCOPY WITH BIOPSY N/A 12/21/2017   Procedure: CYSTOSCOPY WITH BIOPSY;  Surgeon: [name], MD;  Location: [facility] ORS;  Service: Urology;  Laterality: N/A;   ESOPHAGOGASTRODUODENOSCOPY (EGD) WITH PROPOFOL N/A 12/08/2015   Procedure: ESOPHAGOGASTRODUODENOSCOPY  (EGD) WITH PROPOFOL;  Surgeon: Lollie Sails, MD;  Location: [facility] ENDOSCOPY;  Service: Endoscopy;  Laterality: N/A;   ESOPHAGOGASTRODUODENOSCOPY (EGD) WITH PROPOFOL N/A 12/13/2017   Procedure: ESOPHAGOGASTRODUODENOSCOPY (EGD) WITH PROPOFOL;  Surgeon: Lollie Sails, MD;  Location: [facility] ENDOSCOPY;  Service: Endoscopy;  Laterality: N/A;   EYE SURGERY     HERNIA REPAIR     kidney tumor remove     TRANSURETHRAL RESECTION OF BLADDER TUMOR WITH GYRUS (TURBT-GYRUS)  84/1660   UMBILICAL HERNIA REPAIR     urethral meatotomy      There were no vitals filed for this visit.   Subjective Assessment - 07/20/21 1651     Subjective Patient reports falling earlier today while out in community at a lumber yard- was using cane and lost his balance- falling on his right side and hitting head. He denied any pain and states just not moving around well today. He denied any emergency medical services and wanted to come to PT to see what he could do. WIfe reports he looks weaker today.    Patient is accompained by: Family member    Pertinent History Patient is a 76 year old male with referral for Physical Therapy with diagnosis of Parkinsons disease and history of falling. He reports he diagnosed last year with Parkinsons and has improved his tremors with use of medication but reports worsening shuffling and difficulty with mobility. Patient has past medical history signifiant for Parkinsons; Arthritis, bladder cancer, Trigemic Neuralgia. He lives with wife in 1 level home and current using cane with reported recent fall.    Limitations Walking;Lifting;Standing;House hold activities    How long can you sit comfortably? No limits    How long can you stand comfortably? 15 min- limited due to back pain and foot numbess    How long can you walk comfortably? about 80 feet    Patient Stated Goals Improve my balance, be able to get up off the floor so I can maybe work in my garage again.    Currently in Pain?  No/denies               INTERVENTIONS:   BP= 139/79 Right arm in sitting.    Therapeutic Exercises (Seated LE performed secondary to patient with reported fall earlier today - so adjusted exercises to decrease risk of injury):    Seated hip march x 3 sets BLE (1st two with GTB for resistance and last set without resistance and instruction for high knee to maximize ROM- No report of pain)   Seated hip add with ball squeeze with 3 sec hold BLE x 2 sets of 12 reps.  Seated ham curls with GTB - 2 sets of 15 reps- BLE.  Seated LAQ (no resistance today) BLE - 2 sets of 12  reps.  Seated Hip abd with knee bent using GTB - 2 sets of 12 reps  Seated calf raises - no resistance 2 sets of 12 reps Seated Toe raises - no resistance 2 sets of 12 reps.   Education provided throughout session via VC/TC and demonstration to facilitate movement at target joints and correct muscle activation for all testing and exercises performed.                       PT Education - 07/20/21 1653     Education Details Seated LE exercise technique    Person(s) Educated Patient    Methods Explanation;Demonstration;Verbal cues;Tactile cues    Comprehension Verbalized understanding;Returned demonstration;Tactile cues required;Verbal cues required;Need further instruction              PT Short Term Goals - 05/11/21 2080       PT SHORT TERM GOAL #1   Title Pt will be independent with INITIAL  HEP in order to improve strength and balance in order to decrease fall risk and improve function at home and work.    Baseline 04/07/2021= No formal HEP in place. 05/11/2021= Patient reports doing pretty good- compliant with walking program and his stretching/strengthening.    Time 6    Period Weeks    Status Achieved    Target Date 05/19/21      PT SHORT TERM GOAL #2   Title Pt will decrease 5TSTS by at least 3 seconds in order to demonstrate clinically significant improvement in LE strength.     Baseline 04/07/2021= 25 sec; 05/11/2021= 18.65 sec    Time 6    Period Weeks    Status Achieved    Target Date 06/30/21               PT Long Term Goals - 07/01/21 0818       PT LONG TERM GOAL #1   Title Pt will be independent with FINAL HEP in order to improve strength and balance in order to decrease fall risk and improve function at home and work.    Baseline 04/07/2021= No formal HEP in place. 07/01/2021=Patient reports performing walking and some standing LE strengthening exercises as instructed and states no questions at this time.    Time 12    Period Weeks    Status Partially Met    Target Date 09/23/21      PT LONG TERM GOAL #2   Title Pt will improve FOTO to target score of 45% to display perceived improvements in ability to complete ADL's.    Baseline 2/7/22023= 41%; FOTO=53%    Time 12    Period Weeks    Status Achieved    Target Date 06/30/21      PT LONG TERM GOAL #3   Title Pt will decrease 5TSTS by at least 5 seconds in order to demonstrate clinically significant improvement in LE strength.    Baseline 04/07/2021= 25 sec; 05/11/2021= 18.65 sec without UE support; 4/10= 13.76 sec without UE support    Time 12    Period Weeks    Status Achieved    Target Date 06/30/21      PT LONG TERM GOAL #4   Title Pt will decrease TUG to below 17 seconds/decrease in order to demonstrate decreased fall risk.    Baseline 04/07/2021= 21 sec with SPC; 05/11/2021= 18.85 sec with use of cane; 07/21/2021=18.88 sec  avg with use of cane    Time 12  Period Weeks    Status Partially Met    Target Date 09/23/21      PT LONG TERM GOAL #5   Title Pt will increase 10MWT by at least 0.15 m/s in order to demonstrate clinically significant improvement in community ambulation.    Baseline 04/07/2021= 0.56 m/s using SPC; 05/11/2021= 0.54 m/s using SPC; 07/01/2021= 0.6 m/s    Time 12    Period Weeks    Status New    Target Date 09/23/21      PT LONG TERM GOAL #6   Title Pt will increase 6MWT  by at least 29m(1622f in order to demonstrate clinically significant improvement in cardiopulmonary endurance and community ambulation    Baseline 06/08/2021= 600 ft. in 5 min- Stopped due to fatigue using 4WW; 07/01/2021= 665 in 5 min 45 sec using 4WW    Time 4    Period Weeks    Status On-going    Target Date 09/23/21                   Plan - 07/20/21 1648     Clinical Impression Statement Treatment limited to seated therex as patient entered clinic reporting earlier fall at lumber yard - walking with cane- fell- hit his head and right elbow with abrasions yet denied any medical care. He stated no pain during visit but treatment was modified to just seated therex today and patient/wife were educated in looking for any signs of possible head injury and to call MD if his condition deteriorated. He was fatigued with seated therex today yet no significant difficulty. Patient will benefit from skilled PT services to address these deficits, improve balance, and decrease risk for future falls    Personal Factors and Comorbidities Comorbidity 3+    Comorbidities arthritis, bladder cancer, Trigeminal Neuralgia    Examination-Activity Limitations Caring for Others;Carry;Continence;Lift;Squat;Stairs;Stand    Examination-Participation Restrictions Community Activity;Yard Work    [employer] officervolving/Moderate complexity    Rehab Potential Good    PT Frequency 2x / week    PT Duration 12 weeks    PT Treatment/Interventions ADLs/Self Care Home Management;Cryotherapy;Canalith Repostioning;Electrical Stimulation;Moist Heat;DME Instruction;Gait training;Stair training;Functional mobility training;Therapeutic activities;Therapeutic exercise;Balance training;Neuromuscular re-education;Patient/family education;Manual techniques;Passive range of motion;Dry needling;Vestibular    PT Next Visit Plan LE strength, improve step length with ambulation, muscle tissue lengthening    PT Home  Exercise Plan Access Code: [email or web address]             Patient will benefit from skilled therapeutic intervention in order to improve the following deficits and impairments:  Abnormal gait, Decreased activity tolerance, Decreased balance, Decreased coordination, Decreased endurance, Decreased mobility, Decreased range of motion, Decreased strength, Difficulty walking, Hypomobility, Impaired flexibility  Visit Diagnosis: Abnormality of gait and mobility  Difficulty in walking, not elsewhere classified  Muscle weakness (generalized)  Other lack of coordination  Other abnormalities of gait and mobility  Unsteadiness on feet  Chronic bilateral low back pain without sciatica     Problem List Patient Active Problem List   Diagnosis Date Noted   Class 2 obesity due to excess calories with body mass index (BMI) of 36.0 to 36.9 in adult 04/11/2020   Swelling of limb 03/28/2020   Lymphedema 03/28/2020   Trigeminal neuralgia 12/25/2019   Lower limb ulcer, calf, left, limited to breakdown of skin ([place]10/26/2021   OSA (obstructive sleep apnea) 1175/91/6384 Acute systolic CHF (congestive heart failure) ([facility]CCanalou10/13/2020   Bruising 07/10/2018   Diet-controlled type 2 diabetes  mellitus ([place]) 03/24/2018   Microalbuminuria 03/24/2018   Dizziness 11/17/2017   Simple chronic bronchitis (HCC) 09/22/2017   SOBOE (shortness of breath on exertion) 02/18/2017   Chronic midline low back pain without sciatica 12/29/2016   Periodic limb movement disorder 12/29/2016   Benign essential tremor 06/22/2016   Pure hypercholesterolemia 06/20/2015   Erectile dysfunction due to arterial insufficiency 06/16/2015   Unstable angina ([place]) 01/22/2015   Abdominal aortic 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 rupture ([place]) 12/31/2014   Benign prostatic hypertrophy without urinary obstruction 07/31/2014   Enlarged prostate 07/31/2014   Benign essential HTN 06/11/2014   Carotid artery narrowing 02/08/2014   Carotid artery  obstruction 02/08/2014   Bilateral carotid artery stenosis 02/08/2014   Chest pain 08/20/2013   Bilateral cataracts 05/16/2013   Cataract 05/16/2013   Clinical depression 12/12/2012   Diabetes mellitus, type 2 ([place]) 12/12/2012   Combined fat and carbohydrate induced hyperlipemia 12/12/2012   Major depressive disorder, single episode, unspecified 12/12/2012   Acid reflux 11/09/2012   Presence of stent in coronary artery 11/09/2012   BP (high blood pressure) 11/09/2012   Healed myocardial infarct 11/09/2012   Gastro-esophageal reflux disease without esophagitis 11/09/2012   History of cardiovascular surgery 11/09/2012   Fothergill's neuralgia 08/07/2012   Swelling of testicle 04/06/2012   Disorder of male genital organ 04/06/2012   Swelling of the testicles 04/06/2012   Arteriosclerosis of coronary artery 11/16/2011   CAD in native artery 11/16/2011   Fatigue 06/04/2011   Avitaminosis D 11/30/2010    Lewis Moccasin, PT 07/20/2021, 4:54 PM  [place] MAIN [facility] SERVICES [place], [place], [address] Phone: [phone]   Fax:  [phone]  Name: [name] MRN: [id number] Date of Birth: 1945/04/09

## 2021-07-20 NOTE — Progress Notes (Signed)
07/20/2021 11:12 AM   [name] Mar 23, 1945 [id number]  Referring provider: Earlie Counts, FNP No address on file  Chief Complaint  Patient presents with   Urinary Incontinence    HPI: Reviewed note.  Patient has failed many medications.  He is failed third line therapies.  He failed the test stimulation in my office.  He has failed Botox and percutaneous tibial nerve stimulation.  His medical comorbidities.  He wears 2-3 pads a day moderately wet.  He had mild benefit from the peripheral nerve evaluation as noted.  I called in trospium.   Patient in the past or now is never filled it.  He wants go back on oxybutynin since this helped a little bit in the past.  Clinically not infected.  Test emulation did not work.  Still has urge incontinence oxybutynin renewed  Today Patient has failed all third line therapies.  He has failed [place]  Oxybutynin helps minimally.  Clinically not infected.  Frequency stable.  Still uses 2 or 3 pads a day.  He now has Parkinson's is a new diagnosis    PMH: Past Medical History:  Diagnosis Date   Anxiety    Arteriosclerosis of coronary artery 11/16/2011   Overview:  Stent 10/2011 stent rca 2015 with collaterals to lad which is chronically occluded    Benign enlargement of prostate    Benign essential HTN 06/11/2014   Benign prostatic hypertrophy without urinary obstruction 07/31/2014   Bilateral cataracts 05/16/2013   Overview:  [facility]. [facility]     Bone spur of foot    Left   BP (high blood pressure) 11/09/2012   Cancer ([place])    skin (forehead) and bladder   Carotid artery narrowing 02/08/2014   Depression    Detrusor hypertrophy    Diabetes ([place])    Diabetes mellitus, type 2 ([place]) 12/12/2012   Diverticulosis    Dyspnea    Esophageal reflux    Esophageal reflux    Fothergill's neuralgia 08/07/2012   Overview:  [facility]    Gastritis    GERD (gastroesophageal reflux disease)    Headache    cluster headaches   Healed  myocardial infarct 11/09/2012   Hearing loss in left ear    Heart disease    Hematuria    Hemorrhoids    History of hiatal hernia 12/14/2017   small    Hypercholesteremia    Lesion of bladder    Myocardial infarct (HCC)    Presence of stent in coronary artery 11/09/2012   Rectal bleeding    Trigeminal neuralgia    Trigeminal neuralgia    Valvular heart disease    Vitamin D deficiency     Surgical History: Past Surgical History:  Procedure Laterality Date   APPENDECTOMY     BOTOX INJECTION N/A 12/21/2017   Procedure: Bladder BOTOX INJECTION;  Surgeon: [name], MD;  Location: [facility] ORS;  Service: Urology;  Laterality: N/A;   BOTOX INJECTION N/A 09/11/2018   Procedure: Bladder BOTOX INJECTION;  Surgeon: [name], MD;  Location: [facility] ORS;  Service: Urology;  Laterality: N/A;   CARDIAC CATHETERIZATION     CARDIAC CATHETERIZATION N/A 01/22/2015   Procedure: Left Heart Cath;  Surgeon: Corey Skains, MD;  Location: [place] CV LAB;  Service: Cardiovascular;  Laterality: N/A;   CARDIAC CATHETERIZATION N/A 01/22/2015   Procedure: Coronary Stent Intervention;  Surgeon: Isaias Cowman, MD;  Location: [place] CV LAB;  Service: Cardiovascular;  Laterality: N/A;   CATARACT  EXTRACTION, BILATERAL     COLONOSCOPY WITH PROPOFOL N/A 12/08/2015   Procedure: COLONOSCOPY WITH PROPOFOL;  Surgeon: Lollie Sails, MD;  Location: [facility] ENDOSCOPY;  Service: Endoscopy;  Laterality: N/A;   COLONOSCOPY WITH PROPOFOL N/A 12/09/2015   Procedure: COLONOSCOPY WITH PROPOFOL;  Surgeon: Lollie Sails, MD;  Location: [facility] ENDOSCOPY;  Service: Endoscopy;  Laterality: N/A;   CORONARY ANGIOPLASTY     5 stents   CORONARY STENT PLACEMENT  2015   x5   CYSTOSCOPY N/A 09/11/2018   Procedure: CYSTOSCOPY;  Surgeon: [name], MD;  Location: [facility] ORS;  Service: Urology;  Laterality: N/A;   CYSTOSCOPY WITH BIOPSY N/A 12/21/2017   Procedure: CYSTOSCOPY WITH BIOPSY;  Surgeon: [name], MD;  Location: [facility] ORS;  Service: Urology;  Laterality: N/A;   ESOPHAGOGASTRODUODENOSCOPY (EGD) WITH PROPOFOL N/A 12/08/2015   Procedure: ESOPHAGOGASTRODUODENOSCOPY (EGD) WITH PROPOFOL;  Surgeon: Lollie Sails, MD;  Location: [facility] ENDOSCOPY;  Service: Endoscopy;  Laterality: N/A;   ESOPHAGOGASTRODUODENOSCOPY (EGD) WITH PROPOFOL N/A 12/13/2017   Procedure: ESOPHAGOGASTRODUODENOSCOPY (EGD) WITH PROPOFOL;  Surgeon: Lollie Sails, MD;  Location: [facility] ENDOSCOPY;  Service: Endoscopy;  Laterality: N/A;   EYE SURGERY     HERNIA REPAIR     kidney tumor remove     TRANSURETHRAL RESECTION OF BLADDER TUMOR WITH GYRUS (TURBT-GYRUS)  16/1096   UMBILICAL HERNIA REPAIR     urethral meatotomy      Home Medications:  Allergies as of 07/20/2021       Reactions   Ace Inhibitors Cough   Carbamazepine Rash   Other reaction(s): RASH Other reaction(s): RASH   Lyrica [pregabalin] Rash        Medication List        Accurate as of Jul 20, 2021 11:12 AM. If you have any questions, ask your nurse or doctor.          albuterol 108 (90 Base) MCG/ACT inhaler Commonly known as: VENTOLIN HFA   apixaban 2.5 MG Tabs tablet Commonly known as: ELIQUIS Take 1 tablet (2.5 mg total) by mouth 2 (two) times daily.   aspirin 81 MG tablet Take 81 mg by mouth daily.   Cholecalciferol 25 MCG (1000 UT) tablet Take 1,000 Units by mouth daily.   citalopram 20 MG tablet Commonly known as: CELEXA Take 20 mg by mouth at bedtime.   Docusate Sodium 100 MG capsule Take by mouth.   hydrocortisone 2.5 % cream   isosorbide mononitrate 30 MG 24 hr tablet Commonly known as: IMDUR Take 60 mg by mouth daily.   ketoconazole 2 % cream Commonly known as: NIZORAL   multivitamins with iron Tabs tablet Take 1 tablet by mouth daily.   nitroGLYCERIN 0.4 MG SL tablet Commonly known as: NITROSTAT Place under the tongue.   Omega-3 1000 MG Caps Take 1 g by mouth daily. Reported on 03/21/2015   ONE  TOUCH ULTRA TEST test strip Generic drug: glucose blood USE 3 (THREE) TIMES DAILY USE AS INSTRUCTED.- ONE TOUCH ULTRA BLOOD GLUCOSE METER STRIPS   OXcarbazepine 150 MG tablet Commonly known as: TRILEPTAL Take 150 mg by mouth at bedtime.   pantoprazole 40 MG tablet Commonly known as: PROTONIX Take 40 mg by mouth daily.   pramipexole 1 MG tablet Commonly known as: MIRAPEX Take 1 mg by mouth daily.   RA Blood Glucose Monitor Devi by Does not apply route.   rosuvastatin 20 MG tablet Commonly known as: CRESTOR Take by mouth.   sildenafil 50 MG tablet Commonly known as: VIAGRA  Take 50 mg by mouth daily as needed for erectile dysfunction.   terbinafine 250 MG tablet Commonly known as: LAMISIL Take by mouth.   valsartan 40 MG tablet Commonly known as: DIOVAN Take 40 mg by mouth daily.   Vibegron 75 MG Tabs Take 75 mg by mouth daily.        Allergies:  Allergies  Allergen Reactions   Ace Inhibitors Cough   Carbamazepine Rash    Other reaction(s): RASH Other reaction(s): RASH    Lyrica [Pregabalin] Rash    Family History: Family History  Problem Relation Age of Onset   Kidney cancer Mother    Prostate cancer Neg Hx     Social History:  reports that he quit smoking about 31 years ago. His smoking use included cigarettes. He has a 80.00 pack-year smoking history. He has never used smokeless tobacco. He reports that he does not currently use alcohol. He reports that he does not use drugs.  ROS:                                        Physical Exam: BP 114/66   Pulse 72   Constitutional:  Alert and oriented, No acute distress. HEENT: [place] AT, moist mucus membranes.  Trachea midline, no masses.   Laboratory Data: Lab Results  Component Value Date   WBC 7.5 01/23/2015   HGB 13.5 01/23/2015   HCT 40.2 01/23/2015   MCV 76.8 (L) 01/23/2015   PLT 164 01/23/2015    Lab Results  Component Value Date   CREATININE 0.98 01/23/2015     No results found for: PSA  No results found for: TESTOSTERONE  Lab Results  Component Value Date   HGBA1C 6.8 (H) 05/22/2012    Urinalysis    Component Value Date/Time   COLORURINE YELLOW (A) 12/16/2017 1005   APPEARANCEUR Clear 07/01/2021 1052   LABSPEC 1.014 12/16/2017 1005   LABSPEC 1.018 12/31/2013 0916   PHURINE 6.0 12/16/2017 1005   GLUCOSEU Negative 07/01/2021 1052   GLUCOSEU Negative 12/31/2013 0916   HGBUR SMALL (A) 12/16/2017 1005   BILIRUBINUR Negative 07/01/2021 1052   BILIRUBINUR Negative 12/31/2013 [place] 12/16/2017 1005   PROTEINUR Negative 07/01/2021 1052   PROTEINUR NEGATIVE 12/16/2017 1005   NITRITE Negative 07/01/2021 1052   NITRITE NEGATIVE 12/16/2017 1005   LEUKOCYTESUR Negative 07/01/2021 1052   LEUKOCYTESUR Negative 12/31/2013 0916    Pertinent Imaging:   Assessment & Plan: I will see the patient as needed.  Unfortunately he has reached the end of the treatment algorithm  There are no diagnoses linked to this encounter.  No follow-ups on file.  Reece Packer, MD  [place] [address], [place], [place] [address] [phone]

## 2021-07-22 ENCOUNTER — Ambulatory Visit: Payer: Medicare HMO

## 2021-07-28 ENCOUNTER — Telehealth: Payer: Self-pay | Admitting: *Deleted

## 2021-07-28 NOTE — Telephone Encounter (Signed)
Patient states that Vibegron 75 mg is to much money . Any other medication can he take if cheaper?

## 2021-07-29 ENCOUNTER — Ambulatory Visit: Payer: Medicare HMO

## 2021-07-29 DIAGNOSIS — M545 Low back pain, unspecified: Secondary | ICD-10-CM

## 2021-07-29 DIAGNOSIS — R269 Unspecified abnormalities of gait and mobility: Secondary | ICD-10-CM | POA: Diagnosis not present

## 2021-07-29 DIAGNOSIS — R278 Other lack of coordination: Secondary | ICD-10-CM

## 2021-07-29 DIAGNOSIS — M6281 Muscle weakness (generalized): Secondary | ICD-10-CM

## 2021-07-29 DIAGNOSIS — R2689 Other abnormalities of gait and mobility: Secondary | ICD-10-CM

## 2021-07-29 DIAGNOSIS — R262 Difficulty in walking, not elsewhere classified: Secondary | ICD-10-CM

## 2021-07-29 DIAGNOSIS — R2681 Unsteadiness on feet: Secondary | ICD-10-CM

## 2021-07-29 NOTE — Therapy (Signed)
[place] MAIN [facility] SERVICES [address] [place], [place], [address] Phone: [phone]   Fax:  [phone]  Physical Therapy Treatment  Patient Details  Name: [name] MRN: [id number] Date of Birth: Aug 01, 1945 Referring Provider (PT): Dr. Gurney Maxin   Encounter Date: 07/29/2021   PT End of Session - 07/29/21 0807     Visit Number 33    Number of Visits 50    Date for PT Re-Evaluation 09/23/21    Authorization Time Period Initial Certification= 08/01/7856- 06/30/2021; PN on 8/50/2774; Recert 03/02/8784-7/67/2094    Progress Note Due on Visit 25    PT Start Time 0800    PT Stop Time 0844    PT Time Calculation (min) 44 min    Equipment Utilized During Treatment Gait belt    Activity Tolerance Patient tolerated treatment well    Behavior During Therapy [facility] for tasks assessed/performed             Past Medical History:  Diagnosis Date   Anxiety    Arteriosclerosis of coronary artery 11/16/2011   Overview:  Stent 10/2011 stent rca 2015 with collaterals to lad which is chronically occluded    Benign enlargement of prostate    Benign essential HTN 06/11/2014   Benign prostatic hypertrophy without urinary obstruction 07/31/2014   Bilateral cataracts 05/16/2013   Overview:  Dr. [facility]     Bone spur of foot    Left   BP (high blood pressure) 11/09/2012   Cancer ([place])    skin (forehead) and bladder   Carotid artery narrowing 02/08/2014   Depression    Detrusor hypertrophy    Diabetes ([place])    Diabetes mellitus, type 2 ([place]) 12/12/2012   Diverticulosis    Dyspnea    Esophageal reflux    Esophageal reflux    Fothergill's neuralgia 08/07/2012   Overview:  [facility]    Gastritis    GERD (gastroesophageal reflux disease)    Headache    cluster headaches   Healed myocardial infarct 11/09/2012   Hearing loss in left ear    Heart disease    Hematuria    Hemorrhoids    History of hiatal hernia 12/14/2017   small     Hypercholesteremia    Lesion of bladder    Myocardial infarct (HCC)    Presence of stent in coronary artery 11/09/2012   Rectal bleeding    Trigeminal neuralgia    Trigeminal neuralgia    Valvular heart disease    Vitamin D deficiency     Past Surgical History:  Procedure Laterality Date   APPENDECTOMY     BOTOX INJECTION N/A 12/21/2017   Procedure: Bladder BOTOX INJECTION;  Surgeon: [name], MD;  Location: [facility] ORS;  Service: Urology;  Laterality: N/A;   BOTOX INJECTION N/A 09/11/2018   Procedure: Bladder BOTOX INJECTION;  Surgeon: [name], MD;  Location: [facility] ORS;  Service: Urology;  Laterality: N/A;   CARDIAC CATHETERIZATION     CARDIAC CATHETERIZATION N/A 01/22/2015   Procedure: Left Heart Cath;  Surgeon: Corey Skains, MD;  Location: [place] CV LAB;  Service: Cardiovascular;  Laterality: N/A;   CARDIAC CATHETERIZATION N/A 01/22/2015   Procedure: Coronary Stent Intervention;  Surgeon: Isaias Cowman, MD;  Location: [place] CV LAB;  Service: Cardiovascular;  Laterality: N/A;   CATARACT EXTRACTION, BILATERAL     COLONOSCOPY WITH PROPOFOL N/A 12/08/2015   Procedure: COLONOSCOPY WITH PROPOFOL;  Surgeon: Lollie Sails, MD;  Location:  [facility]OSCOPY;  Service: Endoscopy;  Laterality: N/A;   COLONOSCOPY WITH PROPOFOL N/A 12/09/2015   Procedure: COLONOSCOPY WITH PROPOFOL;  Surgeon: Lollie Sails, MD;  Location: [facility] ENDOSCOPY;  Service: Endoscopy;  Laterality: N/A;   CORONARY ANGIOPLASTY     5 stents   CORONARY STENT PLACEMENT  2015   x5   CYSTOSCOPY N/A 09/11/2018   Procedure: CYSTOSCOPY;  Surgeon: [name], MD;  Location: [facility] ORS;  Service: Urology;  Laterality: N/A;   CYSTOSCOPY WITH BIOPSY N/A 12/21/2017   Procedure: CYSTOSCOPY WITH BIOPSY;  Surgeon: [name], MD;  Location: [facility] ORS;  Service: Urology;  Laterality: N/A;   ESOPHAGOGASTRODUODENOSCOPY (EGD) WITH PROPOFOL N/A 12/08/2015   Procedure: ESOPHAGOGASTRODUODENOSCOPY  (EGD) WITH PROPOFOL;  Surgeon: Lollie Sails, MD;  Location: [facility] ENDOSCOPY;  Service: Endoscopy;  Laterality: N/A;   ESOPHAGOGASTRODUODENOSCOPY (EGD) WITH PROPOFOL N/A 12/13/2017   Procedure: ESOPHAGOGASTRODUODENOSCOPY (EGD) WITH PROPOFOL;  Surgeon: Lollie Sails, MD;  Location: [facility] ENDOSCOPY;  Service: Endoscopy;  Laterality: N/A;   EYE SURGERY     HERNIA REPAIR     kidney tumor remove     TRANSURETHRAL RESECTION OF BLADDER TUMOR WITH GYRUS (TURBT-GYRUS)  59/9357   UMBILICAL HERNIA REPAIR     urethral meatotomy      There were no vitals filed for this visit.   Subjective Assessment - 07/29/21 0805     Subjective Patient arrived on wrong day but due to schedule- able to be seen. Rpeorts no pain today but states legs are just slow.    Patient is accompained by: Family member    Pertinent History Patient is a 76 year old male with referral for Physical Therapy with diagnosis of Parkinsons disease and history of falling. He reports he diagnosed last year with Parkinsons and has improved his tremors with use of medication but reports worsening shuffling and difficulty with mobility. Patient has past medical history signifiant for Parkinsons; Arthritis, bladder cancer, Trigemic Neuralgia. He lives with wife in 1 level home and current using cane with reported recent fall.    Limitations Walking;Lifting;Standing;House hold activities    How long can you sit comfortably? No limits    How long can you stand comfortably? 15 min- limited due to back pain and foot numbess    How long can you walk comfortably? about 80 feet    Patient Stated Goals Improve my balance, be able to get up off the floor so I can maybe work in my garage again.    Currently in Pain? No/denies             INTERVENTIONS:  Therex Nustep- L1 (LE only) - VC to keep SPM > 50 x 6 min   Neuromuscular- re ed:   Ladder drill- Forward with 180 deg turns in // bars without UE Support x 10 down and back.  Ladder  drill- Side steps with 180 deg turns in // bars without UE support x 10 down and back. Patient very fatigued upon completion with difficulty taking wide steps  Gait in clinic - no AD - attempting to focus on reciprocal Steps - approx 80 feet = able to complete with short reciprocal steps.   Step taps - in // bars with 1 UE support x 20 reps alt LE.   Trunk rotation with Open arms (+clap) Left to right  x 20 reps each direction.   360 deg turning (hoolahop on floor) - patient positioned inside circle and instructed to stay within circle- able to turn  to right at 10 sec and to left at 8 sec.   Education provided throughout session via VC/TC and demonstration to facilitate movement at target joints and correct muscle activation for all testing and exercises performed.                         PT Education - 07/29/21 0807     Education Details Exercise technique    Person(s) Educated Patient    Methods Explanation;Demonstration;Tactile cues;Verbal cues    Comprehension Verbalized understanding;Returned demonstration;Verbal cues required;Tactile cues required;Need further instruction              PT Short Term Goals - 05/11/21 9735       PT SHORT TERM GOAL #1   Title Pt will be independent with INITIAL  HEP in order to improve strength and balance in order to decrease fall risk and improve function at home and work.    Baseline 04/07/2021= No formal HEP in place. 05/11/2021= Patient reports doing pretty good- compliant with walking program and his stretching/strengthening.    Time 6    Period Weeks    Status Achieved    Target Date 05/19/21      PT SHORT TERM GOAL #2   Title Pt will decrease 5TSTS by at least 3 seconds in order to demonstrate clinically significant improvement in LE strength.    Baseline 04/07/2021= 25 sec; 05/11/2021= 18.65 sec    Time 6    Period Weeks    Status Achieved    Target Date 06/30/21               PT Long Term Goals - 07/01/21  0818       PT LONG TERM GOAL #1   Title Pt will be independent with FINAL HEP in order to improve strength and balance in order to decrease fall risk and improve function at home and work.    Baseline 04/07/2021= No formal HEP in place. 07/01/2021=Patient reports performing walking and some standing LE strengthening exercises as instructed and states no questions at this time.    Time 12    Period Weeks    Status Partially Met    Target Date 09/23/21      PT LONG TERM GOAL #2   Title Pt will improve FOTO to target score of 45% to display perceived improvements in ability to complete ADL's.    Baseline 2/7/22023= 41%; FOTO=53%    Time 12    Period Weeks    Status Achieved    Target Date 06/30/21      PT LONG TERM GOAL #3   Title Pt will decrease 5TSTS by at least 5 seconds in order to demonstrate clinically significant improvement in LE strength.    Baseline 04/07/2021= 25 sec; 05/11/2021= 18.65 sec without UE support; 4/10= 13.76 sec without UE support    Time 12    Period Weeks    Status Achieved    Target Date 06/30/21      PT LONG TERM GOAL #4   Title Pt will decrease TUG to below 17 seconds/decrease in order to demonstrate decreased fall risk.    Baseline 04/07/2021= 21 sec with SPC; 05/11/2021= 18.85 sec with use of cane; 07/21/2021=18.88 sec  avg with use of cane    Time 12    Period Weeks    Status Partially Met    Target Date 09/23/21      PT LONG TERM GOAL #5  Title Pt will increase 10MWT by at least 0.15 m/s in order to demonstrate clinically significant improvement in community ambulation.    Baseline 04/07/2021= 0.56 m/s using SPC; 05/11/2021= 0.54 m/s using SPC; 07/01/2021= 0.6 m/s    Time 12    Period Weeks    Status New    Target Date 09/23/21      PT LONG TERM GOAL #6   Title Pt will increase 6MWT by at least 72m(162f in order to demonstrate clinically significant improvement in cardiopulmonary endurance and community ambulation    Baseline 06/08/2021= 600 ft. in 5  min- Stopped due to fatigue using 4WW; 07/01/2021= 665 in 5 min 45 sec using 4WW    Time 4    Period Weeks    Status On-going    Target Date 09/23/21                   Plan - 07/29/21 0808     Clinical Impression Statement Patient presents with good motivation for today's session. He struggled more walking into clinic today but as his usual does better in clinic with concentration- ladder works well- he is able to take longer step with thant visual cues on floor. Reverts back to more of a shuffle with less compliance with step length on open floor. He did do well with turning today- abel to stay within circle today. Patient will benefit from skilled PT services to address these deficits, improve balance, and decrease risk for future falls    Personal Factors and Comorbidities Comorbidity 3+    Comorbidities arthritis, bladder cancer, Trigeminal Neuralgia    Examination-Activity Limitations Caring for Others;Carry;Continence;Lift;Squat;Stairs;Stand    Examination-Participation Restrictions Community Activity;Yard Work    [employer] officervolving/Moderate complexity    Rehab Potential Good    PT Frequency 2x / week    PT Duration 12 weeks    PT Treatment/Interventions ADLs/Self Care Home Management;Cryotherapy;Canalith Repostioning;Electrical Stimulation;Moist Heat;DME Instruction;Gait training;Stair training;Functional mobility training;Therapeutic activities;Therapeutic exercise;Balance training;Neuromuscular re-education;Patient/family education;Manual techniques;Passive range of motion;Dry needling;Vestibular    PT Next Visit Plan LE strength, improve step length with ambulation, muscle tissue lengthening    PT Home Exercise Plan Access Code: [email or web address]             Patient will benefit from skilled therapeutic intervention in order to improve the following deficits and impairments:  Abnormal gait, Decreased activity tolerance, Decreased balance, Decreased  coordination, Decreased endurance, Decreased mobility, Decreased range of motion, Decreased strength, Difficulty walking, Hypomobility, Impaired flexibility  Visit Diagnosis: Abnormality of gait and mobility  Difficulty in walking, not elsewhere classified  Muscle weakness (generalized)  Other lack of coordination  Other abnormalities of gait and mobility  Unsteadiness on feet  Chronic bilateral low back pain without sciatica     Problem List Patient Active Problem List   Diagnosis Date Noted   Class 2 obesity due to excess calories with body mass index (BMI) of 36.0 to 36.9 in adult 04/11/2020   Swelling of limb 03/28/2020   Lymphedema 03/28/2020   Trigeminal neuralgia 12/25/2019   Lower limb ulcer, calf, left, limited to breakdown of skin ([place]10/26/2021   OSA (obstructive sleep apnea) 1127/74/1287 Acute systolic CHF (congestive heart failure) (HCCla[facility]10/13/2020   Bruising 07/10/2018   Diet-controlled type 2 diabetes mellitus ([facility]01/24/2020   Microalbuminuria 03/24/2018   Dizziness 11/17/2017   Simple chronic bronchitis ([facility]07/25/2019   SOBOE (shortness of breath on exertion) 02/18/2017   Chronic midline low back pain without sciatica  12/29/2016   Periodic limb movement disorder 12/29/2016   Benign essential tremor 06/22/2016   Pure hypercholesterolemia 06/20/2015   Erectile dysfunction due to arterial insufficiency 06/16/2015   Unstable angina ([place]) 01/22/2015   Abdominal aortic 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 rupture ([place]) 12/31/2014   Benign prostatic hypertrophy without urinary obstruction 07/31/2014   Enlarged prostate 07/31/2014   Benign essential HTN 06/11/2014   Carotid artery narrowing 02/08/2014   Carotid artery obstruction 02/08/2014   Bilateral carotid artery stenosis 02/08/2014   Chest pain 08/20/2013   Bilateral cataracts 05/16/2013   Cataract 05/16/2013   Clinical depression 12/12/2012   Diabetes mellitus, type 2 ([place]) 12/12/2012   Combined fat and  carbohydrate induced hyperlipemia 12/12/2012   Major depressive disorder, single episode, unspecified 12/12/2012   Acid reflux 11/09/2012   Presence of stent in coronary artery 11/09/2012   BP (high blood pressure) 11/09/2012   Healed myocardial infarct 11/09/2012   Gastro-esophageal reflux disease without esophagitis 11/09/2012   History of cardiovascular surgery 11/09/2012   Fothergill's neuralgia 08/07/2012   Swelling of testicle 04/06/2012   Disorder of male genital organ 04/06/2012   Swelling of the testicles 04/06/2012   Arteriosclerosis of coronary artery 11/16/2011   CAD in native artery 11/16/2011   Fatigue 06/04/2011   Avitaminosis D 11/30/2010    Lewis Moccasin, PT 07/29/2021, 1:36 PM  [place] MAIN [facility] SERVICES [address] [place], [place], [address] Phone: [phone]   Fax:  [phone]  Name: [name] MRN: [id number] Date of Birth: 1946/02/07

## 2021-07-30 ENCOUNTER — Ambulatory Visit: Payer: Medicare HMO

## 2021-07-31 ENCOUNTER — Other Ambulatory Visit: Payer: Self-pay | Admitting: *Deleted

## 2021-07-31 ENCOUNTER — Telehealth: Payer: Self-pay | Admitting: Urology

## 2021-07-31 DIAGNOSIS — N3281 Overactive bladder: Secondary | ICD-10-CM

## 2021-07-31 MED ORDER — VIBEGRON 75 MG PO TABS: 75.0000 mg | ORAL_TABLET | Freq: Every day | ORAL | 11 refills | Status: AC

## 2021-07-31 NOTE — Telephone Encounter (Signed)
Pt needs a RX for [employer] sent to [facility] in [place].

## 2021-07-31 NOTE — Telephone Encounter (Signed)
RX sent in as requested

## 2021-08-03 ENCOUNTER — Ambulatory Visit: Payer: Medicare HMO

## 2021-08-03 NOTE — Telephone Encounter (Signed)
Pt aware. Pt states he will continue the [place].

## 2021-08-05 ENCOUNTER — Ambulatory Visit: Payer: Medicare HMO

## 2021-08-11 ENCOUNTER — Ambulatory Visit: Payer: Medicare HMO | Attending: Neurology

## 2021-08-11 DIAGNOSIS — G8929 Other chronic pain: Secondary | ICD-10-CM | POA: Diagnosis present

## 2021-08-11 DIAGNOSIS — M545 Low back pain, unspecified: Secondary | ICD-10-CM | POA: Insufficient documentation

## 2021-08-11 DIAGNOSIS — M6281 Muscle weakness (generalized): Secondary | ICD-10-CM | POA: Insufficient documentation

## 2021-08-11 DIAGNOSIS — R2689 Other abnormalities of gait and mobility: Secondary | ICD-10-CM | POA: Insufficient documentation

## 2021-08-11 DIAGNOSIS — R278 Other lack of coordination: Secondary | ICD-10-CM | POA: Diagnosis present

## 2021-08-11 DIAGNOSIS — R262 Difficulty in walking, not elsewhere classified: Secondary | ICD-10-CM | POA: Insufficient documentation

## 2021-08-11 DIAGNOSIS — R2681 Unsteadiness on feet: Secondary | ICD-10-CM | POA: Diagnosis present

## 2021-08-11 DIAGNOSIS — R269 Unspecified abnormalities of gait and mobility: Secondary | ICD-10-CM | POA: Diagnosis present

## 2021-08-11 NOTE — Therapy (Signed)
[place] MAIN [facility] SERVICES [address] [place], [place], [address] Phone: [phone]   Fax:  [phone]  Physical Therapy Treatment  Patient Details  Name: [name] MRN: [id number] Date of Birth: 05/28/1945 Referring Provider (PT): Dr. Gurney Maxin   Encounter Date: 08/11/2021   PT End of Session - 08/11/21 0940     Visit Number 34    Number of Visits 50    Date for PT Re-Evaluation 09/23/21    Authorization Time Period Initial Certification= 08/03/5372- 06/30/2021; PN on 10/25/784; Recert 09/03/4490-0/11/710    Progress Note Due on Visit 57    PT Start Time 0933    PT Stop Time 1013    PT Time Calculation (min) 40 min    Equipment Utilized During Treatment Gait belt    Activity Tolerance Patient tolerated treatment well    Behavior During Therapy [facility] for tasks assessed/performed             Past Medical History:  Diagnosis Date   Anxiety    Arteriosclerosis of coronary artery 11/16/2011   Overview:  Stent 10/2011 stent rca 2015 with collaterals to lad which is chronically occluded    Benign enlargement of prostate    Benign essential HTN 06/11/2014   Benign prostatic hypertrophy without urinary obstruction 07/31/2014   Bilateral cataracts 05/16/2013   Overview:  Dr. [facility]     Bone spur of foot    Left   BP (high blood pressure) 11/09/2012   Cancer ([place])    skin (forehead) and bladder   Carotid artery narrowing 02/08/2014   Depression    Detrusor hypertrophy    Diabetes ([place])    Diabetes mellitus, type 2 ([place]) 12/12/2012   Diverticulosis    Dyspnea    Esophageal reflux    Esophageal reflux    Fothergill's neuralgia 08/07/2012   Overview:  [facility]    Gastritis    GERD (gastroesophageal reflux disease)    Headache    cluster headaches   Healed myocardial infarct 11/09/2012   Hearing loss in left ear    Heart disease    Hematuria    Hemorrhoids    History of hiatal hernia 12/14/2017   small     Hypercholesteremia    Lesion of bladder    Myocardial infarct (HCC)    Presence of stent in coronary artery 11/09/2012   Rectal bleeding    Trigeminal neuralgia    Trigeminal neuralgia    Valvular heart disease    Vitamin D deficiency     Past Surgical History:  Procedure Laterality Date   APPENDECTOMY     BOTOX INJECTION N/A 12/21/2017   Procedure: Bladder BOTOX INJECTION;  Surgeon: [name], MD;  Location: [facility] ORS;  Service: Urology;  Laterality: N/A;   BOTOX INJECTION N/A 09/11/2018   Procedure: Bladder BOTOX INJECTION;  Surgeon: [name], MD;  Location: [facility] ORS;  Service: Urology;  Laterality: N/A;   CARDIAC CATHETERIZATION     CARDIAC CATHETERIZATION N/A 01/22/2015   Procedure: Left Heart Cath;  Surgeon: Corey Skains, MD;  Location: [place] CV LAB;  Service: Cardiovascular;  Laterality: N/A;   CARDIAC CATHETERIZATION N/A 01/22/2015   Procedure: Coronary Stent Intervention;  Surgeon: Isaias Cowman, MD;  Location: [place] CV LAB;  Service: Cardiovascular;  Laterality: N/A;   CATARACT EXTRACTION, BILATERAL     COLONOSCOPY WITH PROPOFOL N/A 12/08/2015   Procedure: COLONOSCOPY WITH PROPOFOL;  Surgeon: Lollie Sails, MD;  Location:  [place] ENDOSCOPY;  Service: Endoscopy;  Laterality: N/A;   COLONOSCOPY WITH PROPOFOL N/A 12/09/2015   Procedure: COLONOSCOPY WITH PROPOFOL;  Surgeon: Lollie Sails, MD;  Location: [facility] ENDOSCOPY;  Service: Endoscopy;  Laterality: N/A;   CORONARY ANGIOPLASTY     5 stents   CORONARY STENT PLACEMENT  2015   x5   CYSTOSCOPY N/A 09/11/2018   Procedure: CYSTOSCOPY;  Surgeon: [name], MD;  Location: [facility] ORS;  Service: Urology;  Laterality: N/A;   CYSTOSCOPY WITH BIOPSY N/A 12/21/2017   Procedure: CYSTOSCOPY WITH BIOPSY;  Surgeon: [name], MD;  Location: [facility] ORS;  Service: Urology;  Laterality: N/A;   ESOPHAGOGASTRODUODENOSCOPY (EGD) WITH PROPOFOL N/A 12/08/2015   Procedure: ESOPHAGOGASTRODUODENOSCOPY  (EGD) WITH PROPOFOL;  Surgeon: Lollie Sails, MD;  Location: [facility] ENDOSCOPY;  Service: Endoscopy;  Laterality: N/A;   ESOPHAGOGASTRODUODENOSCOPY (EGD) WITH PROPOFOL N/A 12/13/2017   Procedure: ESOPHAGOGASTRODUODENOSCOPY (EGD) WITH PROPOFOL;  Surgeon: Lollie Sails, MD;  Location: [facility] ENDOSCOPY;  Service: Endoscopy;  Laterality: N/A;   EYE SURGERY     HERNIA REPAIR     kidney tumor remove     TRANSURETHRAL RESECTION OF BLADDER TUMOR WITH GYRUS (TURBT-GYRUS)  07/3014   UMBILICAL HERNIA REPAIR     urethral meatotomy      There were no vitals filed for this visit.   Subjective Assessment - 08/11/21 0937     Subjective Patient reports feeling some better today. States he had some days where he couldn't move his legs well and another fall at home - can't remember if I had my cane or not.    Patient is accompained by: Family member    Pertinent History Patient is a 76 year old male with referral for Physical Therapy with diagnosis of Parkinsons disease and history of falling. He reports he diagnosed last year with Parkinsons and has improved his tremors with use of medication but reports worsening shuffling and difficulty with mobility. Patient has past medical history signifiant for Parkinsons; Arthritis, bladder cancer, Trigemic Neuralgia. He lives with wife in 1 level home and current using cane with reported recent fall.    Limitations Walking;Lifting;Standing;House hold activities    How long can you sit comfortably? No limits    How long can you stand comfortably? 15 min- limited due to back pain and foot numbess    How long can you walk comfortably? about 80 feet    Patient Stated Goals Improve my balance, be able to get up off the floor so I can maybe work in my garage again.    Currently in Pain? No/denies              INTERVENTIONS:   Therapeutic Exercises:   - Nustep L0- LE only x 5 min - O2 sat 93% and paitent with mild audible wheeze initially yet did not hear  again. O2 sat =95% upon completion.   Seated LE strengthening:   Seated hip march 2.5 lb AW BLE x 10 reps x 2 sets Seated knee ext 2.5 lb AW BLE x 10 x 2 sets  Seated ham curls with BTB BLE x 10 reps x 2 sets Seated hip flex/abd up/over hedgehog x 10 reps x 2 sets  Seated calf raises BLE x 15 reps x 2 sets O2 sat =96%.  Seated toe raises- BLE x 15 reps x 2 sets  Diagonal UE chops holding soft red ball x 12 reps each direction.  Lateral Trunk twist while holding soft red ball x 12 to left  then right  Education provided throughout session via VC/TC and demonstration to facilitate movement at target joints and correct muscle activation for all testing and exercises performed. Rationale for Evaluation and Treatment Rehabilitation                          PT Education - 08/11/21 0939     Education Details Exercise technique    Person(s) Educated Patient    Methods Explanation;Demonstration;Tactile cues;Verbal cues    Comprehension Verbalized understanding;Returned demonstration;Verbal cues required;Tactile cues required              PT Short Term Goals - 05/11/21 0812       PT SHORT TERM GOAL #1   Title Pt will be independent with INITIAL  HEP in order to improve strength and balance in order to decrease fall risk and improve function at home and work.    Baseline 04/07/2021= No formal HEP in place. 05/11/2021= Patient reports doing pretty good- compliant with walking program and his stretching/strengthening.    Time 6    Period Weeks    Status Achieved    Target Date 05/19/21      PT SHORT TERM GOAL #2   Title Pt will decrease 5TSTS by at least 3 seconds in order to demonstrate clinically significant improvement in LE strength.    Baseline 04/07/2021= 25 sec; 05/11/2021= 18.65 sec    Time 6    Period Weeks    Status Achieved    Target Date 06/30/21               PT Long Term Goals - 07/01/21 0818       PT LONG TERM GOAL #1   Title Pt will be  independent with FINAL HEP in order to improve strength and balance in order to decrease fall risk and improve function at home and work.    Baseline 04/07/2021= No formal HEP in place. 07/01/2021=Patient reports performing walking and some standing LE strengthening exercises as instructed and states no questions at this time.    Time 12    Period Weeks    Status Partially Met    Target Date 09/23/21      PT LONG TERM GOAL #2   Title Pt will improve FOTO to target score of 45% to display perceived improvements in ability to complete ADL's.    Baseline 2/7/22023= 41%; FOTO=53%    Time 12    Period Weeks    Status Achieved    Target Date 06/30/21      PT LONG TERM GOAL #3   Title Pt will decrease 5TSTS by at least 5 seconds in order to demonstrate clinically significant improvement in LE strength.    Baseline 04/07/2021= 25 sec; 05/11/2021= 18.65 sec without UE support; 4/10= 13.76 sec without UE support    Time 12    Period Weeks    Status Achieved    Target Date 06/30/21      PT LONG TERM GOAL #4   Title Pt will decrease TUG to below 17 seconds/decrease in order to demonstrate decreased fall risk.    Baseline 04/07/2021= 21 sec with SPC; 05/11/2021= 18.85 sec with use of cane; 07/21/2021=18.88 sec  avg with use of cane    Time 12    Period Weeks    Status Partially Met    Target Date 09/23/21      PT LONG TERM GOAL #5   Title Pt will increase 10MWT  by at least 0.15 m/s in order to demonstrate clinically significant improvement in community ambulation.    Baseline 04/07/2021= 0.56 m/s using SPC; 05/11/2021= 0.54 m/s using SPC; 07/01/2021= 0.6 m/s    Time 12    Period Weeks    Status New    Target Date 09/23/21      PT LONG TERM GOAL #6   Title Pt will increase 6MWT by at least 81m(1649f in order to demonstrate clinically significant improvement in cardiopulmonary endurance and community ambulation    Baseline 06/08/2021= 600 ft. in 5 min- Stopped due to fatigue using 4WW; 07/01/2021= 665 in 5  min 45 sec using 4WW    Time 4    Period Weeks    Status On-going    Target Date 09/23/21                   Plan - 08/11/21 0940     Clinical Impression Statement Patient presents with increased overall fatigue entering clinic so treatment focused on more seated LE strengthening. He was able to complete 2 rounds of seated therex today but visibly more fatigued. He reported feeling okay- Just tired. He had audible wheeze a bit during Nustep but no obvious signs of any shortness of breath. Patient will benefit from skilled PT services to address these deficits, improve balance, and decrease risk for future falls    Personal Factors and Comorbidities Comorbidity 3+    Comorbidities arthritis, bladder cancer, Trigeminal Neuralgia    Examination-Activity Limitations Caring for Others;Carry;Continence;Lift;Squat;Stairs;Stand    Examination-Participation Restrictions Community Activity;Yard Work    [employer] officervolving/Moderate complexity    Rehab Potential Good    PT Frequency 2x / week    PT Duration 12 weeks    PT Treatment/Interventions ADLs/Self Care Home Management;Cryotherapy;Canalith Repostioning;Electrical Stimulation;Moist Heat;DME Instruction;Gait training;Stair training;Functional mobility training;Therapeutic activities;Therapeutic exercise;Balance training;Neuromuscular re-education;Patient/family education;Manual techniques;Passive range of motion;Dry needling;Vestibular    PT Next Visit Plan LE strength, improve step length with ambulation, muscle tissue lengthening    PT Home Exercise Plan Access Code: [email or web address]             Patient will benefit from skilled therapeutic intervention in order to improve the following deficits and impairments:  Abnormal gait, Decreased activity tolerance, Decreased balance, Decreased coordination, Decreased endurance, Decreased mobility, Decreased range of motion, Decreased strength, Difficulty walking, Hypomobility,  Impaired flexibility  Visit Diagnosis: Abnormality of gait and mobility  Difficulty in walking, not elsewhere classified  Muscle weakness (generalized)  Other lack of coordination  Other abnormalities of gait and mobility  Unsteadiness on feet  Chronic bilateral low back pain without sciatica     Problem List Patient Active Problem List   Diagnosis Date Noted   Class 2 obesity due to excess calories with body mass index (BMI) of 36.0 to 36.9 in adult 04/11/2020   Swelling of limb 03/28/2020   Lymphedema 03/28/2020   Trigeminal neuralgia 12/25/2019   Lower limb ulcer, calf, left, limited to breakdown of skin ([place]10/26/2021   OSA (obstructive sleep apnea) 1162/56/3893 Acute systolic CHF (congestive heart failure) ([place]10/13/2020   Bruising 07/10/2018   Diet-controlled type 2 diabetes mellitus ([facility]01/24/2020   Microalbuminuria 03/24/2018   Dizziness 11/17/2017   Simple chronic bronchitis ([place]07/25/2019   SOBOE (shortness of breath on exertion) 02/18/2017   Chronic midline low back pain without sciatica 12/29/2016   Periodic limb movement disorder 12/29/2016   Benign essential tremor 06/22/2016   Pure hypercholesterolemia 06/20/2015  Erectile dysfunction due to arterial insufficiency 06/16/2015   Unstable angina ([place]) 01/22/2015   Abdominal aortic 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 rupture ([place]) 12/31/2014   Benign prostatic hypertrophy without urinary obstruction 07/31/2014   Enlarged prostate 07/31/2014   Benign essential HTN 06/11/2014   Carotid artery narrowing 02/08/2014   Carotid artery obstruction 02/08/2014   Bilateral carotid artery stenosis 02/08/2014   Chest pain 08/20/2013   Bilateral cataracts 05/16/2013   Cataract 05/16/2013   Clinical depression 12/12/2012   Diabetes mellitus, type 2 ([place]) 12/12/2012   Combined fat and carbohydrate induced hyperlipemia 12/12/2012   Major depressive disorder, single episode, unspecified 12/12/2012   Acid reflux 11/09/2012    Presence of stent in coronary artery 11/09/2012   BP (high blood pressure) 11/09/2012   Healed myocardial infarct 11/09/2012   Gastro-esophageal reflux disease without esophagitis 11/09/2012   History of cardiovascular surgery 11/09/2012   Fothergill's neuralgia 08/07/2012   Swelling of testicle 04/06/2012   Disorder of male genital organ 04/06/2012   Swelling of the testicles 04/06/2012   Arteriosclerosis of coronary artery 11/16/2011   CAD in native artery 11/16/2011   Fatigue 06/04/2011   Avitaminosis D 11/30/2010    Lewis Moccasin, PT 08/12/2021, 8:12 AM  [place], [place], [address] Phone: [phone]   Fax:  [phone]  Name: KREGG CIHLAR MRN: [id number] Date of Birth: May 15, 1945

## 2021-08-13 ENCOUNTER — Ambulatory Visit: Payer: Medicare HMO

## 2021-08-13 DIAGNOSIS — R278 Other lack of coordination: Secondary | ICD-10-CM

## 2021-08-13 DIAGNOSIS — G8929 Other chronic pain: Secondary | ICD-10-CM

## 2021-08-13 DIAGNOSIS — M6281 Muscle weakness (generalized): Secondary | ICD-10-CM

## 2021-08-13 DIAGNOSIS — R2689 Other abnormalities of gait and mobility: Secondary | ICD-10-CM

## 2021-08-13 DIAGNOSIS — R262 Difficulty in walking, not elsewhere classified: Secondary | ICD-10-CM

## 2021-08-13 DIAGNOSIS — R2681 Unsteadiness on feet: Secondary | ICD-10-CM

## 2021-08-13 DIAGNOSIS — M545 Low back pain, unspecified: Secondary | ICD-10-CM

## 2021-08-13 DIAGNOSIS — R269 Unspecified abnormalities of gait and mobility: Secondary | ICD-10-CM | POA: Diagnosis not present

## 2021-08-13 NOTE — Therapy (Signed)
[place] MAIN [facility] SERVICES [address] [place], [place], [address] Phone: [phone]   Fax:  [phone]  Physical Therapy Treatment  Patient Details  Name: [name] MRN: [id number] Date of Birth: [birth year] Referring Provider (PT): Dr. Gurney Maxin   Encounter Date: 08/13/2021   PT End of Session - 08/13/21 1107     Visit Number 35    Number of Visits 50    Date for PT Re-Evaluation 09/23/21    Authorization Time Period Initial Certification= 04/09/9369- 06/30/2021; PN on 6/96/7893; Recert 09/29/173-03/02/5850    Progress Note Due on Visit 85    PT Start Time 1017    PT Stop Time 1059    PT Time Calculation (min) 42 min    Equipment Utilized During Treatment Gait belt    Activity Tolerance Patient tolerated treatment well    Behavior During Therapy [facility] for tasks assessed/performed             Past Medical History:  Diagnosis Date   Anxiety    Arteriosclerosis of coronary artery 11/16/2011   Overview:  Stent 10/2011 stent rca 2015 with collaterals to lad which is chronically occluded    Benign enlargement of prostate    Benign essential HTN 06/11/2014   Benign prostatic hypertrophy without urinary obstruction 07/31/2014   Bilateral cataracts 05/16/2013   Overview:  Dr. [facility]     Bone spur of foot    Left   BP (high blood pressure) 11/09/2012   Cancer ([place])    skin (forehead) and bladder   Carotid artery narrowing 02/08/2014   Depression    Detrusor hypertrophy    Diabetes ([place])    Diabetes mellitus, type 2 ([place]) 12/12/2012   Diverticulosis    Dyspnea    Esophageal reflux    Esophageal reflux    Fothergill's neuralgia 08/07/2012   Overview:  [facility]    Gastritis    GERD (gastroesophageal reflux disease)    Headache    cluster headaches   Healed myocardial infarct 11/09/2012   Hearing loss in left ear    Heart disease    Hematuria    Hemorrhoids    History of hiatal hernia 12/14/2017   small     Hypercholesteremia    Lesion of bladder    Myocardial infarct (HCC)    Presence of stent in coronary artery 11/09/2012   Rectal bleeding    Trigeminal neuralgia    Trigeminal neuralgia    Valvular heart disease    Vitamin D deficiency     Past Surgical History:  Procedure Laterality Date   APPENDECTOMY     BOTOX INJECTION N/A 12/21/2017   Procedure: Bladder BOTOX INJECTION;  Surgeon: [name], MD;  Location: [facility] ORS;  Service: Urology;  Laterality: N/A;   BOTOX INJECTION N/A 09/11/2018   Procedure: Bladder BOTOX INJECTION;  Surgeon: [name], MD;  Location: [facility] ORS;  Service: Urology;  Laterality: N/A;   CARDIAC CATHETERIZATION     CARDIAC CATHETERIZATION N/A 01/22/2015   Procedure: Left Heart Cath;  Surgeon: Corey Skains, MD;  Location: [place] CV LAB;  Service: Cardiovascular;  Laterality: N/A;   CARDIAC CATHETERIZATION N/A 01/22/2015   Procedure: Coronary Stent Intervention;  Surgeon: Isaias Cowman, MD;  Location: [place] CV LAB;  Service: Cardiovascular;  Laterality: N/A;   CATARACT EXTRACTION, BILATERAL     COLONOSCOPY WITH PROPOFOL N/A 12/08/2015   Procedure: COLONOSCOPY WITH PROPOFOL;  Surgeon: Lollie Sails, MD;  Location:  [place] ENDOSCOPY;  Service: Endoscopy;  Laterality: N/A;   COLONOSCOPY WITH PROPOFOL N/A 12/09/2015   Procedure: COLONOSCOPY WITH PROPOFOL;  Surgeon: Lollie Sails, MD;  Location: [facility] ENDOSCOPY;  Service: Endoscopy;  Laterality: N/A;   CORONARY ANGIOPLASTY     5 stents   CORONARY STENT PLACEMENT  2015   x5   CYSTOSCOPY N/A 09/11/2018   Procedure: CYSTOSCOPY;  Surgeon: [name], MD;  Location: [facility] ORS;  Service: Urology;  Laterality: N/A;   CYSTOSCOPY WITH BIOPSY N/A 12/21/2017   Procedure: CYSTOSCOPY WITH BIOPSY;  Surgeon: [name], MD;  Location: [facility] ORS;  Service: Urology;  Laterality: N/A;   ESOPHAGOGASTRODUODENOSCOPY (EGD) WITH PROPOFOL N/A 12/08/2015   Procedure: ESOPHAGOGASTRODUODENOSCOPY  (EGD) WITH PROPOFOL;  Surgeon: Lollie Sails, MD;  Location: [facility] ENDOSCOPY;  Service: Endoscopy;  Laterality: N/A;   ESOPHAGOGASTRODUODENOSCOPY (EGD) WITH PROPOFOL N/A 12/13/2017   Procedure: ESOPHAGOGASTRODUODENOSCOPY (EGD) WITH PROPOFOL;  Surgeon: Lollie Sails, MD;  Location: [facility] ENDOSCOPY;  Service: Endoscopy;  Laterality: N/A;   EYE SURGERY     HERNIA REPAIR     kidney tumor remove     TRANSURETHRAL RESECTION OF BLADDER TUMOR WITH GYRUS (TURBT-GYRUS)  92/4268   UMBILICAL HERNIA REPAIR     urethral meatotomy      There were no vitals filed for this visit.   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 States his legs continue to feel weak and like "jello."    Patient is accompained by: Family member    Pertinent History Patient is a 76 year old male with referral for Physical Therapy with diagnosis of Parkinsons disease and history of falling. He reports he diagnosed last year with Parkinsons and has improved his tremors with use of medication but reports worsening shuffling and difficulty with mobility. Patient has past medical history signifiant for Parkinsons; Arthritis, bladder cancer, Trigemic Neuralgia. He lives with wife in 1 level home and current using cane with reported recent fall.    Limitations Walking;Lifting;Standing;House hold activities    How long can you sit comfortably? No limits    How long can you stand comfortably? 15 min- limited due to back pain and foot numbess    How long can you walk comfortably? about 80 feet    Patient Stated Goals Improve my balance, be able to get up off the floor so I can maybe work in my garage again.    Currently in Pain? No/denies             INTERVENTIONS:   Therex:   Seated hip march with 4lb AW alt LE x 20 reps   Seated knee ext with 4 lb AW alt LE  with 3 sec hold and slow eccentric control x 15 reps each LE.    Seated hip add with red ball squeeze hold 5 sec x 10 reps  Seated hamstring curl with BTB x 15 reps each LE.    Standing Hip flex/abd/add up and over orange hurdle x 12 reps   Seated cal raises x 15 reps (toes on 1/2 foam)   Seated toe raises x 15 reps (heels on 1/2 foam)    Standing at support bar/dry erase board- Patient performed dynamic side/overhead reaching  to left and right separating letter/number magnets by color  - requiring 5 min to complete with VC to step and  reach diagonally.   Education provided throughout session via VC/TC and demonstration to facilitate movement at target joints and correct muscle activation for all testing and exercises performed.   Rationale for Evaluation and Treatment Rehabilitation                      PT Education - 08/13/21 1113     Education Details Exercise technique    Person(s) Educated Patient    Methods Explanation;Demonstration;Tactile cues;Verbal cues;Handout    Comprehension Verbalized understanding;Returned demonstration;Verbal cues required;Tactile cues required              PT Short Term Goals - 05/11/21 0812       PT SHORT TERM GOAL #1   Title Pt will be independent with INITIAL  HEP in order to improve strength and balance in order to decrease fall risk and improve function at home and work.    Baseline 04/07/2021= No formal HEP in place. 05/11/2021= Patient reports doing pretty good- compliant with walking program and his stretching/strengthening.    Time 6    Period Weeks    Status Achieved    Target Date 05/19/21      PT SHORT TERM GOAL #2   Title Pt will decrease 5TSTS by at least 3 seconds in order to demonstrate clinically significant improvement in LE strength.    Baseline 04/07/2021= 25 sec; 05/11/2021= 18.65 sec    Time 6    Period Weeks    Status Achieved    Target Date 06/30/21               PT Long Term Goals -  07/01/21 0818       PT LONG TERM GOAL #1   Title Pt will be independent with FINAL HEP in order to improve strength and balance in order to decrease fall risk and improve function at home and work.    Baseline 04/07/2021= No formal HEP in place. 07/01/2021=Patient reports performing walking and some standing LE strengthening exercises as instructed and states no questions at this time.    Time 12    Period Weeks    Status Partially Met    Target Date 09/23/21      PT LONG TERM GOAL #2   Title Pt will improve FOTO to target score of 45% to display perceived improvements in ability to complete ADL's.    Baseline 2/7/22023= 41%; FOTO=53%    Time 12    Period Weeks    Status Achieved    Target Date 06/30/21      PT LONG TERM GOAL #3   Title Pt will decrease 5TSTS by at least 5 seconds in order to demonstrate clinically significant improvement in LE strength.    Baseline 04/07/2021= 25 sec; 05/11/2021= 18.65 sec without UE support; 4/10= 13.76 sec without UE support    Time 12    Period Weeks    Status Achieved    Target Date 06/30/21      PT LONG TERM GOAL #4   Title Pt will decrease TUG to below 17 seconds/decrease in order to demonstrate decreased fall risk.    Baseline 04/07/2021= 21 sec with SPC; 05/11/2021= 18.85 sec with use of cane; 07/21/2021=18.88 sec  avg with use of cane    Time 12    Period Weeks    Status Partially Met    Target Date 09/23/21      PT LONG TERM GOAL #5   Title Pt will increase 10MWT by  at least 0.15 m/s in order to demonstrate clinically significant improvement in community ambulation.    Baseline 04/07/2021= 0.56 m/s using SPC; 05/11/2021= 0.54 m/s using SPC; 07/01/2021= 0.6 m/s    Time 12    Period Weeks    Status New    Target Date 09/23/21      PT LONG TERM GOAL #6   Title Pt will increase 6MWT by at least 77m(1636f in order to demonstrate clinically significant improvement in cardiopulmonary endurance and community ambulation    Baseline 06/08/2021= 600 ft.  in 5 min- Stopped due to fatigue using 4WW; 07/01/2021= 665 in 5 min 45 sec using 4WW    Time 4    Period Weeks    Status On-going    Target Date 09/23/21                   Plan - 08/13/21 1108     Clinical Impression Statement Patient presented with good motivation for today's session. He was fatigued overall but able to participate in mostly  seated therex and a few added standing activities to promote some overall improved standing endurance. He presented with overall increase in weakness over past 2 weeks but stronger today vs. Earlier this week. Patient will benefit from skilled PT services to address these deficits, improve balance, and decrease risk for future falls    Personal Factors and Comorbidities Comorbidity 3+    Comorbidities arthritis, bladder cancer, Trigeminal Neuralgia    Examination-Activity Limitations Caring for Others;Carry;Continence;Lift;Squat;Stairs;Stand    Examination-Participation Restrictions Community Activity;Yard Work    St[employer] officervolving/Moderate complexity    Rehab Potential Good    PT Frequency 2x / week    PT Duration 12 weeks    PT Treatment/Interventions ADLs/Self Care Home Management;Cryotherapy;Canalith Repostioning;Electrical Stimulation;Moist Heat;DME Instruction;Gait training;Stair training;Functional mobility training;Therapeutic activities;Therapeutic exercise;Balance training;Neuromuscular re-education;Patient/family education;Manual techniques;Passive range of motion;Dry needling;Vestibular    PT Next Visit Plan LE strength, improve step length with ambulation, muscle tissue lengthening    PT Home Exercise Plan Access Code: [email or web address]             Patient will benefit from skilled therapeutic intervention in order to improve the following deficits and impairments:  Abnormal gait, Decreased activity tolerance, Decreased balance, Decreased coordination, Decreased endurance, Decreased mobility, Decreased range of  motion, Decreased strength, Difficulty walking, Hypomobility, Impaired flexibility  Visit Diagnosis: Abnormality of gait and mobility  Difficulty in walking, not elsewhere classified  Muscle weakness (generalized)  Other lack of coordination  Other abnormalities of gait and mobility  Unsteadiness on feet  Chronic bilateral low back pain without sciatica     Problem List Patient Active Problem List   Diagnosis Date Noted   Class 2 obesity due to excess calories with body mass index (BMI) of 36.0 to 36.9 in adult 04/11/2020   Swelling of limb 03/28/2020   Lymphedema 03/28/2020   Trigeminal neuralgia 12/25/2019   Lower limb ulcer, calf, left, limited to breakdown of skin ([facility]10/26/2021   OSA (obstructive sleep apnea) 1199/83/3825 Acute systolic CHF (congestive heart failure) ([facility]10/13/2020   Bruising 07/10/2018   Diet-controlled type 2 diabetes mellitus ([facility]01/24/2020   Microalbuminuria 03/24/2018   Dizziness 11/17/2017   Simple chronic bronchitis ([facility]07/25/2019   SOBOE (shortness of breath on exertion) 02/18/2017   Chronic midline low back pain without sciatica 12/29/2016   Periodic limb movement disorder 12/29/2016   Benign essential tremor 06/22/2016   Pure hypercholesterolemia 06/20/2015   Erectile dysfunction due  to arterial insufficiency 06/16/2015   Unstable angina ([place]) 01/22/2015   Abdominal aortic 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 rupture ([place]) 12/31/2014   Benign prostatic hypertrophy without urinary obstruction 07/31/2014   Enlarged prostate 07/31/2014   Benign essential HTN 06/11/2014   Carotid artery narrowing 02/08/2014   Carotid artery obstruction 02/08/2014   Bilateral carotid artery stenosis 02/08/2014   Chest pain 08/20/2013   Bilateral cataracts 05/16/2013   Cataract 05/16/2013   Clinical depression 12/12/2012   Diabetes mellitus, type 2 ([place]) 12/12/2012   Combined fat and carbohydrate induced hyperlipemia 12/12/2012   Major depressive disorder,  single episode, unspecified 12/12/2012   Acid reflux 11/09/2012   Presence of stent in coronary artery 11/09/2012   BP (high blood pressure) 11/09/2012   Healed myocardial infarct 11/09/2012   Gastro-esophageal reflux disease without esophagitis 11/09/2012   History of cardiovascular surgery 11/09/2012   Fothergill's neuralgia 08/07/2012   Swelling of testicle 04/06/2012   Disorder of male genital organ 04/06/2012   Swelling of the testicles 04/06/2012   Arteriosclerosis of coronary artery 11/16/2011   CAD in native artery 11/16/2011   Fatigue 06/04/2011   Avitaminosis D 11/30/2010    Lewis Moccasin, PT 08/13/2021, 11:17 AM  [place], [place], [address] Phone: [phone]   Fax:  [phone]  Name: STANLEE ROEHRIG MRN: [id number] Date of Birth: 1945-12-21

## 2021-08-18 ENCOUNTER — Ambulatory Visit: Payer: Medicare HMO

## 2021-08-18 DIAGNOSIS — M6281 Muscle weakness (generalized): Secondary | ICD-10-CM

## 2021-08-18 DIAGNOSIS — R269 Unspecified abnormalities of gait and mobility: Secondary | ICD-10-CM

## 2021-08-18 DIAGNOSIS — R278 Other lack of coordination: Secondary | ICD-10-CM

## 2021-08-18 DIAGNOSIS — R262 Difficulty in walking, not elsewhere classified: Secondary | ICD-10-CM

## 2021-08-18 DIAGNOSIS — R2689 Other abnormalities of gait and mobility: Secondary | ICD-10-CM

## 2021-08-18 DIAGNOSIS — R2681 Unsteadiness on feet: Secondary | ICD-10-CM

## 2021-08-18 DIAGNOSIS — G8929 Other chronic pain: Secondary | ICD-10-CM

## 2021-08-18 NOTE — Therapy (Signed)
[place] MAIN [facility] SERVICES [address] [place], [place], [address] Phone: [phone]   Fax:  [phone]  Physical Therapy Treatment  Patient Details  Name: [name] MRN: [id number] Date of Birth: 03/14/1945 Referring Provider (PT): Dr. Gurney Maxin   Encounter Date: 08/18/2021   PT End of Session - 08/18/21 0938     Visit Number 36    Number of Visits 51    Date for PT Re-Evaluation 09/23/21    Authorization Time Period Initial Certification= 07/06/3092- 06/30/2021; PN on 0/76/8088; Recert 03/01/313-9/45/8592    Progress Note Due on Visit 36    PT Start Time 0930    PT Stop Time 1012    PT Time Calculation (min) 42 min    Equipment Utilized During Treatment Gait belt    Activity Tolerance Patient tolerated treatment well    Behavior During Therapy [facility] for tasks assessed/performed             Past Medical History:  Diagnosis Date   Anxiety    Arteriosclerosis of coronary artery 11/16/2011   Overview:  Stent 10/2011 stent rca 2015 with collaterals to lad which is chronically occluded    Benign enlargement of prostate    Benign essential HTN 06/11/2014   Benign prostatic hypertrophy without urinary obstruction 07/31/2014   Bilateral cataracts 05/16/2013   Overview:  Dr. [facility]     Bone spur of foot    Left   BP (high blood pressure) 11/09/2012   Cancer ([place])    skin (forehead) and bladder   Carotid artery narrowing 02/08/2014   Depression    Detrusor hypertrophy    Diabetes ([place])    Diabetes mellitus, type 2 ([place]) 12/12/2012   Diverticulosis    Dyspnea    Esophageal reflux    Esophageal reflux    Fothergill's neuralgia 08/07/2012   Overview:  [facility]    Gastritis    GERD (gastroesophageal reflux disease)    Headache    cluster headaches   Healed myocardial infarct 11/09/2012   Hearing loss in left ear    Heart disease    Hematuria    Hemorrhoids    History of hiatal hernia 12/14/2017   small     Hypercholesteremia    Lesion of bladder    Myocardial infarct (HCC)    Presence of stent in coronary artery 11/09/2012   Rectal bleeding    Trigeminal neuralgia    Trigeminal neuralgia    Valvular heart disease    Vitamin D deficiency     Past Surgical History:  Procedure Laterality Date   APPENDECTOMY     BOTOX INJECTION N/A 12/21/2017   Procedure: Bladder BOTOX INJECTION;  Surgeon: [name], MD;  Location: [facility] ORS;  Service: Urology;  Laterality: N/A;   BOTOX INJECTION N/A 09/11/2018   Procedure: Bladder BOTOX INJECTION;  Surgeon: [name], MD;  Location: [facility] ORS;  Service: Urology;  Laterality: N/A;   CARDIAC CATHETERIZATION     CARDIAC CATHETERIZATION N/A 01/22/2015   Procedure: Left Heart Cath;  Surgeon: Corey Skains, MD;  Location: [place] [facility];  Service: Cardiovascular;  Laterality: N/A;   CARDIAC CATHETERIZATION N/A 01/22/2015   Procedure: Coronary Stent Intervention;  Surgeon: Isaias Cowman, MD;  Location: [place] [facility];  Service: Cardiovascular;  Laterality: N/A;   CATARACT EXTRACTION, BILATERAL     COLONOSCOPY WITH PROPOFOL N/A 12/08/2015   Procedure: COLONOSCOPY WITH PROPOFOL;  Surgeon: Lollie Sails, MD;  Location:  [place] [facility]OSCOPY;  Service: Endoscopy;  Laterality: N/A;   COLONOSCOPY WITH PROPOFOL N/A 12/09/2015   Procedure: COLONOSCOPY WITH PROPOFOL;  Surgeon: Lollie Sails, MD;  Location: [facility] ENDOSCOPY;  Service: Endoscopy;  Laterality: N/A;   CORONARY ANGIOPLASTY     5 stents   CORONARY STENT PLACEMENT  2015   x5   CYSTOSCOPY N/A 09/11/2018   Procedure: CYSTOSCOPY;  Surgeon: [name], MD;  Location: [facility] ORS;  Service: Urology;  Laterality: N/A;   CYSTOSCOPY WITH BIOPSY N/A 12/21/2017   Procedure: CYSTOSCOPY WITH BIOPSY;  Surgeon: [name], MD;  Location: [facility] ORS;  Service: Urology;  Laterality: N/A;   ESOPHAGOGASTRODUODENOSCOPY (EGD) WITH PROPOFOL N/A 12/08/2015   Procedure: ESOPHAGOGASTRODUODENOSCOPY  (EGD) WITH PROPOFOL;  Surgeon: Lollie Sails, MD;  Location: [facility] ENDOSCOPY;  Service: Endoscopy;  Laterality: N/A;   ESOPHAGOGASTRODUODENOSCOPY (EGD) WITH PROPOFOL N/A 12/13/2017   Procedure: ESOPHAGOGASTRODUODENOSCOPY (EGD) WITH PROPOFOL;  Surgeon: Lollie Sails, MD;  Location: [facility] ENDOSCOPY;  Service: Endoscopy;  Laterality: N/A;   EYE SURGERY     HERNIA REPAIR     kidney tumor remove     TRANSURETHRAL RESECTION OF BLADDER TUMOR WITH GYRUS (TURBT-GYRUS)  74/1638   UMBILICAL HERNIA REPAIR     urethral meatotomy      There were no vitals filed for this visit.   Subjective Assessment - 08/18/21 0935     Subjective Patient reports he had another fall in home over the weekend. Tripping over a towel in bathroom. Reports having a sore shoulder.    Patient is accompained by: Family member    Pertinent History Patient is a 76 year old male with referral for Physical Therapy with diagnosis of Parkinsons disease and history of falling. He reports he diagnosed last year with Parkinsons and has improved his tremors with use of medication but reports worsening shuffling and difficulty with mobility. Patient has past medical history signifiant for Parkinsons; Arthritis, bladder cancer, Trigemic Neuralgia. He lives with wife in 1 level home and current using cane with reported recent fall.    Limitations Walking;Lifting;Standing;House hold activities    How long can you sit comfortably? No limits    How long can you stand comfortably? 15 min- limited due to back pain and foot numbess    How long can you walk comfortably? about 80 feet    Patient Stated Goals Improve my balance, be able to get up off the floor so I can maybe work in my garage again.    Currently in Pain? No/denies               INTERVENTIONS:   Therex:   Nustep - LE and UE (for trunk mobility)  L0 for 4 min L2 for 4 min  Trunk Twist (seated with PVC) x 25 reps each direction  Sit to stand  holding  onto PVC  pipe with overhead movement- x 12 reps  Step taps onto 6" block x 20 reps Step up- lateral on/off 6" block x 20 reps   Forward lunge squat onto BOSU x 15 reps each LE  Education provided throughout session via VC/TC and demonstration to facilitate movement at target joints and correct muscle activation for all testing and exercises performed.   Rationale for Evaluation and Treatment Rehabilitation                      PT Education - 08/18/21 [phone]     Education Details Exercise technique    Person(s) Educated Patient  Methods Explanation;Demonstration;Tactile cues;Verbal cues    Comprehension Verbalized understanding;Verbal cues required;Returned demonstration;Need further instruction;Tactile cues required              PT Short Term Goals - 05/11/21 0812       PT SHORT TERM GOAL #1   Title Pt will be independent with INITIAL  HEP in order to improve strength and balance in order to decrease fall risk and improve function at home and work.    Baseline 04/07/2021= No formal HEP in place. 05/11/2021= Patient reports doing pretty good- compliant with walking program and his stretching/strengthening.    Time 6    Period Weeks    Status Achieved    Target Date 05/19/21      PT SHORT TERM GOAL #2   Title Pt will decrease 5TSTS by at least 3 seconds in order to demonstrate clinically significant improvement in LE strength.    Baseline 04/07/2021= 25 sec; 05/11/2021= 18.65 sec    Time 6    Period Weeks    Status Achieved    Target Date 06/30/21               PT Long Term Goals - 07/01/21 0818       PT LONG TERM GOAL #1   Title Pt will be independent with FINAL HEP in order to improve strength and balance in order to decrease fall risk and improve function at home and work.    Baseline 04/07/2021= No formal HEP in place. 07/01/2021=Patient reports performing walking and some standing LE strengthening exercises as instructed and states no questions at this time.     Time 12    Period Weeks    Status Partially Met    Target Date 09/23/21      PT LONG TERM GOAL #2   Title Pt will improve FOTO to target score of 45% to display perceived improvements in ability to complete ADL's.    Baseline 2/7/22023= 41%; FOTO=53%    Time 12    Period Weeks    Status Achieved    Target Date 06/30/21      PT LONG TERM GOAL #3   Title Pt will decrease 5TSTS by at least 5 seconds in order to demonstrate clinically significant improvement in LE strength.    Baseline 04/07/2021= 25 sec; 05/11/2021= 18.65 sec without UE support; 4/10= 13.76 sec without UE support    Time 12    Period Weeks    Status Achieved    Target Date 06/30/21      PT LONG TERM GOAL #4   Title Pt will decrease TUG to below 17 seconds/decrease in order to demonstrate decreased fall risk.    Baseline 04/07/2021= 21 sec with SPC; 05/11/2021= 18.85 sec with use of cane; 07/21/2021=18.88 sec  avg with use of cane    Time 12    Period Weeks    Status Partially Met    Target Date 09/23/21      PT LONG TERM GOAL #5   Title Pt will increase 10MWT by at least 0.15 m/s in order to demonstrate clinically significant improvement in community ambulation.    Baseline 04/07/2021= 0.56 m/s using SPC; 05/11/2021= 0.54 m/s using SPC; 07/01/2021= 0.6 m/s    Time 12    Period Weeks    Status New    Target Date 09/23/21      PT LONG TERM GOAL #6   Title Pt will increase 6MWT by at least 28m(1645f in order to  demonstrate clinically significant improvement in cardiopulmonary endurance and community ambulation    Baseline 06/08/2021= 600 ft. in 5 min- Stopped due to fatigue using 4WW; 07/01/2021= 665 in 5 min 45 sec using 4WW    Time 4    Period Weeks    Status On-going    Target Date 09/23/21                   Plan - 08/18/21 0939     Clinical Impression Statement Patient presents with good motivation for today's session. He was fatigued more upon arrival but participated well and able to raise his arms  overall and twist better with practice today. He was abel to take good steps with step taps and lunges today. Patient will benefit from skilled PT services to address these deficits, improve balance, and decrease risk for future falls    Personal Factors and Comorbidities Comorbidity 3+    Comorbidities arthritis, bladder cancer, Trigeminal Neuralgia    Examination-Activity Limitations Caring for Others;Carry;Continence;Lift;Squat;Stairs;Stand    Examination-Participation Restrictions Community Activity;Yard Work    [employer] Evolving/Moderate complexity    Rehab Potential Good    PT Frequency 2x / week    PT Duration 12 weeks    PT Treatment/Interventions ADLs/Self Care Home Management;Cryotherapy;Canalith Repostioning;Electrical Stimulation;Moist Heat;DME Instruction;Gait training;Stair training;Functional mobility training;Therapeutic activities;Therapeutic exercise;Balance training;Neuromuscular re-education;Patient/family education;Manual techniques;Passive range of motion;Dry needling;Vestibular    PT Next Visit Plan LE strength, improve step length with ambulation, muscle tissue lengthening    PT Home Exercise Plan Access Code: [email or web address]             Patient will benefit from skilled therapeutic intervention in order to improve the following deficits and impairments:  Abnormal gait, Decreased activity tolerance, Decreased balance, Decreased coordination, Decreased endurance, Decreased mobility, Decreased range of motion, Decreased strength, Difficulty walking, Hypomobility, Impaired flexibility  Visit Diagnosis: Abnormality of gait and mobility  Difficulty in walking, not elsewhere classified  Muscle weakness (generalized)  Other lack of coordination  Other abnormalities of gait and mobility  Unsteadiness on feet  Chronic bilateral low back pain without sciatica     Problem List Patient Active Problem List   Diagnosis Date Noted   Class 2  obesity due to excess calories with body mass index (BMI) of 36.0 to 36.9 in adult 04/11/2020   Swelling of limb 03/28/2020   Lymphedema 03/28/2020   Trigeminal neuralgia 12/25/2019   Lower limb ulcer, calf, left, limited to breakdown of skin ([place]) 12/25/2019   OSA (obstructive sleep apnea) 79/89/2119   Acute systolic CHF (congestive heart failure) ([place]) 12/12/2018   Bruising 07/10/2018   Diet-controlled type 2 diabetes mellitus ([place]) 03/24/2018   Microalbuminuria 03/24/2018   Dizziness 11/17/2017   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   SOBOE (shortness of breath on exertion) 02/18/2017   Chronic midline low back pain without sciatica 12/29/2016   Periodic limb movement disorder 12/29/2016   Benign essential tremor 06/22/2016   Pure hypercholesterolemia 06/20/2015   Erectile dysfunction due to arterial insufficiency 06/16/2015   Unstable angina ([place]) 01/22/2015   Abdominal aortic 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 rupture ([place]) 12/31/2014   Benign prostatic hypertrophy without urinary obstruction 07/31/2014   Enlarged prostate 07/31/2014   Benign essential HTN 06/11/2014   Carotid artery narrowing 02/08/2014   Carotid artery obstruction 02/08/2014   Bilateral carotid artery stenosis 02/08/2014   Chest pain 08/20/2013   Bilateral cataracts 05/16/2013   Cataract 05/16/2013   Clinical depression 12/12/2012   Diabetes mellitus, type 2 ([place]) 12/12/2012  Combined fat and carbohydrate induced hyperlipemia 12/12/2012   Major depressive disorder, single episode, unspecified 12/12/2012   Acid reflux 11/09/2012   Presence of stent in coronary artery 11/09/2012   BP (high blood pressure) 11/09/2012   Healed myocardial infarct 11/09/2012   Gastro-esophageal reflux disease without esophagitis 11/09/2012   History of cardiovascular surgery 11/09/2012   Fothergill's neuralgia 08/07/2012   Swelling of testicle 04/06/2012   Disorder of male genital organ 04/06/2012   Swelling of the testicles  04/06/2012   Arteriosclerosis of coronary artery 11/16/2011   CAD in native artery 11/16/2011   Fatigue 06/04/2011   Avitaminosis D 11/30/2010    Lewis Moccasin, PT 08/18/2021, 9:27 PM  [place] MAIN [facility] SERVICES [address] [place], [place], [address] Phone: [phone]   Fax:  [phone]  Name: [name] MRN: [id number] Date of Birth: 11/09/1945

## 2021-08-20 ENCOUNTER — Ambulatory Visit: Payer: Medicare HMO

## 2021-08-20 DIAGNOSIS — R278 Other lack of coordination: Secondary | ICD-10-CM

## 2021-08-20 DIAGNOSIS — M6281 Muscle weakness (generalized): Secondary | ICD-10-CM

## 2021-08-20 DIAGNOSIS — R269 Unspecified abnormalities of gait and mobility: Secondary | ICD-10-CM | POA: Diagnosis not present

## 2021-08-20 DIAGNOSIS — R2689 Other abnormalities of gait and mobility: Secondary | ICD-10-CM

## 2021-08-20 DIAGNOSIS — G8929 Other chronic pain: Secondary | ICD-10-CM

## 2021-08-20 DIAGNOSIS — R2681 Unsteadiness on feet: Secondary | ICD-10-CM

## 2021-08-20 DIAGNOSIS — R262 Difficulty in walking, not elsewhere classified: Secondary | ICD-10-CM

## 2021-08-20 NOTE — Therapy (Signed)
OUTPATIENT PHYSICAL THERAPY TREATMENT NOTE   Patient Name: [name] MRN: [id number] DOB:02-May-1945, 76 y.o., male Today's Date: 08/20/2021  PCP: [name]. [name], [place] PROVIDER: Dr. Gurney Maxin   PT End of Session - 08/20/21 0938     Visit Number 37    Number of Visits 50    Date for PT Re-Evaluation 09/23/21    Authorization Time Period Initial Certification= 09/06/2954- 06/30/2021; PN on 04/14/863; Recert 09/06/4694-2/95/2841    Progress Note Due on Visit 27    PT Start Time 0930    PT Stop Time 1014    PT Time Calculation (min) 44 min    Equipment Utilized During Treatment Gait belt    Activity Tolerance Patient tolerated treatment well    Behavior During Therapy [facility] for tasks assessed/performed             Past Medical History:  Diagnosis Date   Anxiety    Arteriosclerosis of coronary artery 11/16/2011   Overview:  Stent 10/2011 stent rca 2015 with collaterals to lad which is chronically occluded    Benign enlargement of prostate    Benign essential HTN 06/11/2014   Benign prostatic hypertrophy without urinary obstruction 07/31/2014   Bilateral cataracts 05/16/2013   Overview:  Dr. [facility]     Bone spur of foot    Left   BP (high blood pressure) 11/09/2012   Cancer ([place])    skin (forehead) and bladder   Carotid artery narrowing 02/08/2014   Depression    Detrusor hypertrophy    Diabetes ([place])    Diabetes mellitus, type 2 ([place]) 12/12/2012   Diverticulosis    Dyspnea    Esophageal reflux    Esophageal reflux    Fothergill's neuralgia 08/07/2012   Overview:  [facility]    Gastritis    GERD (gastroesophageal reflux disease)    Headache    cluster headaches   Healed myocardial infarct 11/09/2012   Hearing loss in left ear    Heart disease    Hematuria    Hemorrhoids    History of hiatal hernia 12/14/2017   small    Hypercholesteremia    Lesion of bladder    Myocardial infarct (HCC)    Presence of stent in coronary artery 11/09/2012    Rectal bleeding    Trigeminal neuralgia    Trigeminal neuralgia    Valvular heart disease    Vitamin D deficiency    Past Surgical History:  Procedure Laterality Date   APPENDECTOMY     BOTOX INJECTION N/A 12/21/2017   Procedure: Bladder BOTOX INJECTION;  Surgeon: [name], MD;  Location: [facility] ORS;  Service: Urology;  Laterality: N/A;   BOTOX INJECTION N/A 09/11/2018   Procedure: Bladder BOTOX INJECTION;  Surgeon: [name], MD;  Location: [facility] ORS;  Service: Urology;  Laterality: N/A;   CARDIAC CATHETERIZATION     CARDIAC CATHETERIZATION N/A 01/22/2015   Procedure: Left Heart Cath;  Surgeon: Corey Skains, MD;  Location: [place] CV LAB;  Service: Cardiovascular;  Laterality: N/A;   CARDIAC CATHETERIZATION N/A 01/22/2015   Procedure: Coronary Stent Intervention;  Surgeon: Isaias Cowman, MD;  Location: [place] CV LAB;  Service: Cardiovascular;  Laterality: N/A;   CATARACT EXTRACTION, BILATERAL     COLONOSCOPY WITH PROPOFOL N/A 12/08/2015   Procedure: COLONOSCOPY WITH PROPOFOL;  Surgeon: Lollie Sails, MD;  Location: [facility] ENDOSCOPY;  Service: Endoscopy;  Laterality: N/A;   COLONOSCOPY WITH PROPOFOL N/A 12/09/2015   Procedure: COLONOSCOPY WITH PROPOFOL;  Surgeon: Lollie Sails, MD;  Location: [facility] ENDOSCOPY;  Service: Endoscopy;  Laterality: N/A;   CORONARY ANGIOPLASTY     5 stents   CORONARY STENT PLACEMENT  2015   x5   CYSTOSCOPY N/A 09/11/2018   Procedure: CYSTOSCOPY;  Surgeon: [name], MD;  Location: [facility] ORS;  Service: Urology;  Laterality: N/A;   CYSTOSCOPY WITH BIOPSY N/A 12/21/2017   Procedure: CYSTOSCOPY WITH BIOPSY;  Surgeon: [name], MD;  Location: [facility] ORS;  Service: Urology;  Laterality: N/A;   ESOPHAGOGASTRODUODENOSCOPY (EGD) WITH PROPOFOL N/A 12/08/2015   Procedure: ESOPHAGOGASTRODUODENOSCOPY (EGD) WITH PROPOFOL;  Surgeon: Lollie Sails, MD;  Location: [facility] ENDOSCOPY;  Service: Endoscopy;  Laterality: N/A;    ESOPHAGOGASTRODUODENOSCOPY (EGD) WITH PROPOFOL N/A 12/13/2017   Procedure: ESOPHAGOGASTRODUODENOSCOPY (EGD) WITH PROPOFOL;  Surgeon: Lollie Sails, MD;  Location: [facility] ENDOSCOPY;  Service: Endoscopy;  Laterality: N/A;   EYE SURGERY     HERNIA REPAIR     kidney tumor remove     TRANSURETHRAL RESECTION OF BLADDER TUMOR WITH GYRUS (TURBT-GYRUS)  91/4782   UMBILICAL HERNIA REPAIR     urethral meatotomy     Patient Active Problem List   Diagnosis Date Noted   Class 2 obesity due to excess calories with body mass index (BMI) of 36.0 to 36.9 in adult 04/11/2020   Swelling of limb 03/28/2020   Lymphedema 03/28/2020   Trigeminal neuralgia 12/25/2019   Lower limb ulcer, calf, left, limited to breakdown of skin ([place]) 12/25/2019   OSA (obstructive sleep apnea) 95/62/1308   Acute systolic CHF (congestive heart failure) ([place]) 12/12/2018   Bruising 07/10/2018   Diet-controlled type 2 diabetes mellitus ([place]) 03/24/2018   Microalbuminuria 03/24/2018   Dizziness 11/17/2017   Simple chronic bronchitis ([place]) 09/22/2017   SOBOE (shortness of breath on exertion) 02/18/2017   Chronic midline low back pain without sciatica 12/29/2016   Periodic limb movement disorder 12/29/2016   Benign essential tremor 06/22/2016   Pure hypercholesterolemia 06/20/2015   Erectile dysfunction due to arterial insufficiency 06/16/2015   Unstable angina ([place]) 01/22/2015   Abdominal aortic 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 rupture ([place]) 12/31/2014   Benign prostatic hypertrophy without urinary obstruction 07/31/2014   Enlarged prostate 07/31/2014   Benign essential HTN 06/11/2014   Carotid artery narrowing 02/08/2014   Carotid artery obstruction 02/08/2014   Bilateral carotid artery stenosis 02/08/2014   Chest pain 08/20/2013   Bilateral cataracts 05/16/2013   Cataract 05/16/2013   Clinical depression 12/12/2012   Diabetes mellitus, type 2 ([place]) 12/12/2012   Combined fat and carbohydrate induced hyperlipemia 12/12/2012    Major depressive disorder, single episode, unspecified 12/12/2012   Acid reflux 11/09/2012   Presence of stent in coronary artery 11/09/2012   BP (high blood pressure) 11/09/2012   Healed myocardial infarct 11/09/2012   Gastro-esophageal reflux disease without esophagitis 11/09/2012   History of cardiovascular surgery 11/09/2012   Fothergill's neuralgia 08/07/2012   Swelling of testicle 04/06/2012   Disorder of male genital organ 04/06/2012   Swelling of the testicles 04/06/2012   Arteriosclerosis of coronary artery 11/16/2011   CAD in native artery 11/16/2011   Fatigue 06/04/2011   Avitaminosis D 11/30/2010    REFERRING DIAG: Repeated falls   THERAPY DIAG:   Abnormality of gait and mobility  Difficulty in walking, not elsewhere classified  Muscle weakness (generalized)  Other lack of coordination  Other abnormalities of gait and mobility  Unsteadiness on feet  Chronic bilateral low back pain without sciatica  Rationale for Evaluation and Treatment Rehabilitation  PERTINENT  HISTORY: Patient is a 76 year old male with referral for Physical Therapy with diagnosis of Parkinsons disease and history of falling. He reports he diagnosed last year with Parkinsons and has improved his tremors with use of medication but reports worsening shuffling and difficulty with mobility. Patient has past medical history signifiant for Parkinsons; Arthritis, bladder cancer, Trigemic Neuralgia. He lives with wife in 1 level home and current using cane with reported recent fall.   PRECAUTIONS: Falls  SUBJECTIVE: Patient reports he had another fall this week- while walking through the doors at [place] doors shut on him causing him to fall. Reports increased right shoulder pain- Did not seek any emergency services and wife was able to help him up.   PAIN:  Are you having pain? Yes: NPRS scale: 5/10 Pain location: right shoulder Pain description: ache/sharp at times with  movement Aggravating factors: Overhead reaching Relieving factors: Rest     TODAY'S TREATMENT:  08/20/2021  Therex:    Nustep - LE and UE (for trunk mobility)  L0 for 2 min L2 for 4 min No report of increased Right UE pain  Seated hip march- 2.5 lb AW alt LE 2 sets x 20 reps  Seated LAQ- 2.5 AW alt LE x 20 reps  Forward/backward step up and over orange hurdle with 2.5 AW x 15  Side step up and over orange hurdle with 2.5 AW   Trunk Twist (standing with PVC) x 25 reps each direction   Sit to stand  holding  onto PVC pipe - x 16 reps     Forward lunge squat onto BOSU x 15 reps each LE with 2.5 lb AW  Standing calf raises/Toes raises 2.5 lb AW BLE x 20 reps  Standing forward/retro gait in // bars   Education provided throughout session via VC/TC and demonstration to facilitate movement at target joints and correct muscle activation for all testing and exercises performed.     PATIENT EDUCATION: Education details: Exercise technique Person educated: Patient Education method: Explanation, Demonstration, Tactile cues, and Verbal cues Education comprehension: verbalized understanding, returned demonstration, verbal cues required, tactile cues required, and needs further education   HOME EXERCISE PROGRAM: No updates today   PT Short Term Goals -       PT SHORT TERM GOAL #1   Title Pt will be independent with INITIAL  HEP in order to improve strength and balance in order to decrease fall risk and improve function at home and work.    Baseline 04/07/2021= No formal HEP in place. 05/11/2021= Patient reports doing pretty good- compliant with walking program and his stretching/strengthening.    Time 6    Period Weeks    Status Achieved    Target Date 05/19/21      PT SHORT TERM GOAL #2   Title Pt will decrease 5TSTS by at least 3 seconds in order to demonstrate clinically significant improvement in LE strength.    Baseline 04/07/2021= 25 sec; 05/11/2021= 18.65 sec    Time  6    Period Weeks    Status Achieved    Target Date 06/30/21              PT Long Term Goals -       PT LONG TERM GOAL #1   Title Pt will be independent with FINAL HEP in order to improve strength and balance in order to decrease fall risk and improve function at home and work.    Baseline 04/07/2021=  No formal HEP in place. 07/01/2021=Patient reports performing walking and some standing LE strengthening exercises as instructed and states no questions at this time.    Time 12    Period Weeks    Status Partially Met    Target Date 09/23/21      PT LONG TERM GOAL #2   Title Pt will improve FOTO to target score of 45% to display perceived improvements in ability to complete ADL's.    Baseline 2/7/22023= 41%; FOTO=53%    Time 12    Period Weeks    Status Achieved    Target Date 06/30/21      PT LONG TERM GOAL #3   Title Pt will decrease 5TSTS by at least 5 seconds in order to demonstrate clinically significant improvement in LE strength.    Baseline 04/07/2021= 25 sec; 05/11/2021= 18.65 sec without UE support; 4/10= 13.76 sec without UE support    Time 12    Period Weeks    Status Achieved    Target Date 06/30/21      PT LONG TERM GOAL #4   Title Pt will decrease TUG to below 17 seconds/decrease in order to demonstrate decreased fall risk.    Baseline 04/07/2021= 21 sec with SPC; 05/11/2021= 18.85 sec with use of cane; 07/21/2021=18.88 sec  avg with use of cane    Time 12    Period Weeks    Status Partially Met    Target Date 09/23/21      PT LONG TERM GOAL #5   Title Pt will increase 10MWT by at least 0.15 m/s in order to demonstrate clinically significant improvement in community ambulation.    Baseline 04/07/2021= 0.56 m/s using SPC; 05/11/2021= 0.54 m/s using SPC; 07/01/2021= 0.6 m/s    Time 12    Period Weeks    Status New    Target Date 09/23/21      PT LONG TERM GOAL #6   Title Pt will increase 6MWT by at least 69m(1659f in order to demonstrate clinically significant  improvement in cardiopulmonary endurance and community ambulation    Baseline 06/08/2021= 600 ft. in 5 min- Stopped due to fatigue using 4WW; 07/01/2021= 665 in 5 min 45 sec using 4WW    Time 4    Period Weeks    Status On-going    Target Date 09/23/21              Plan - 08/20/21 0939     Clinical Impression Statement Patient presents with good participation today and able to progress overall balance and strengthening exercises. He did have some difficulty with retro gait with decreased step length and limited overall by fatigue. Patient will benefit from skilled PT services to address these deficits, improve balance, and decrease risk for future falls    Personal Factors and Comorbidities Comorbidity 3+    Comorbidities arthritis, bladder cancer, Trigeminal Neuralgia    Examination-Activity Limitations Caring for Others;Carry;Continence;Lift;Squat;Stairs;Stand    Examination-Participation Restrictions Community Activity;Yard Work    [employer] officervolving/Moderate complexity    Rehab Potential Good    PT Frequency 2x / week    PT Duration 12 weeks    PT Treatment/Interventions ADLs/Self Care Home Management;Cryotherapy;Canalith Repostioning;Electrical Stimulation;Moist Heat;DME Instruction;Gait training;Stair training;Functional mobility training;Therapeutic activities;Therapeutic exercise;Balance training;Neuromuscular re-education;Patient/family education;Manual techniques;Passive range of motion;Dry needling;Vestibular    PT Next Visit Plan LE strength, improve step length with ambulation, muscle tissue lengthening    PT Home Exercise Plan Access Code: [email or web address]  Lewis Moccasin, PT 08/20/2021, 10:15 PM

## 2021-08-25 ENCOUNTER — Ambulatory Visit: Payer: Medicare HMO | Admitting: Physical Therapy

## 2021-08-25 ENCOUNTER — Encounter: Payer: Self-pay | Admitting: Physical Therapy

## 2021-08-25 DIAGNOSIS — M6281 Muscle weakness (generalized): Secondary | ICD-10-CM

## 2021-08-25 DIAGNOSIS — R2689 Other abnormalities of gait and mobility: Secondary | ICD-10-CM

## 2021-08-25 DIAGNOSIS — R269 Unspecified abnormalities of gait and mobility: Secondary | ICD-10-CM

## 2021-08-25 DIAGNOSIS — R262 Difficulty in walking, not elsewhere classified: Secondary | ICD-10-CM

## 2021-08-25 NOTE — Therapy (Signed)
OUTPATIENT PHYSICAL THERAPY TREATMENT NOTE   Patient Name: [name] MRN: [id number] DOB:11-12-1945, 76 y.o., male Today's Date: 08/25/2021  PCP: Orene Desanctis. Moshe Cipro, FNP REFERRING PROVIDER: Dr. Theora Master   PT End of Session - 08/25/21 1259     Visit Number 38    Number of Visits 50    Date for PT Re-Evaluation 09/23/21    Authorization Time Period Initial Certification= 04/07/2021- 06/30/2021; PN on 05/11/2021; Recert 07/01/2021-09/23/2021    Progress Note Due on Visit 40    PT Start Time 1302    PT Stop Time 1343    PT Time Calculation (min) 41 min    Equipment Utilized During Treatment Gait belt    Activity Tolerance Patient tolerated treatment well    Behavior During Therapy [facility] for tasks assessed/performed             Past Medical History:  Diagnosis Date   Anxiety    Arteriosclerosis of coronary artery 11/16/2011   Overview:  Stent 10/2011 stent rca 2015 with collaterals to lad which is chronically occluded    Benign enlargement of prostate    Benign essential HTN 06/11/2014   Benign prostatic hypertrophy without urinary obstruction 07/31/2014   Bilateral cataracts 05/16/2013   Overview:  [facility]     Bone spur of foot    Left   BP (high blood pressure) 11/09/2012   Cancer (HCC)    skin (forehead) and bladder   Carotid artery narrowing 02/08/2014   Depression    Detrusor hypertrophy    Diabetes (HCC)    Diabetes mellitus, type 2 (HCC) 12/12/2012   Diverticulosis    Dyspnea    Esophageal reflux    Esophageal reflux    Fothergill's neuralgia 08/07/2012   Overview:  [facility]    Gastritis    GERD (gastroesophageal reflux disease)    Headache    cluster headaches   Healed myocardial infarct 11/09/2012   Hearing loss in left ear    Heart disease    Hematuria    Hemorrhoids    History of hiatal hernia 12/14/2017   small    Hypercholesteremia    Lesion of bladder    Myocardial infarct (HCC)    Presence of stent in coronary artery 11/09/2012    Rectal bleeding    Trigeminal neuralgia    Trigeminal neuralgia    Valvular heart disease    Vitamin D deficiency    Past Surgical History:  Procedure Laterality Date   APPENDECTOMY     BOTOX INJECTION N/A 12/21/2017   Procedure: Bladder BOTOX INJECTION;  Surgeon: Vanna Scotland, MD;  Location: [facility] ORS;  Service: Urology;  Laterality: N/A;   BOTOX INJECTION N/A 09/11/2018   Procedure: Bladder BOTOX INJECTION;  Surgeon: Vanna Scotland, MD;  Location: [facility] ORS;  Service: Urology;  Laterality: N/A;   CARDIAC CATHETERIZATION     CARDIAC CATHETERIZATION N/A 01/22/2015   Procedure: Left Heart Cath;  Surgeon: Lamar Blinks, MD;  Location: [facility] INVASIVE CV LAB;  Service: Cardiovascular;  Laterality: N/A;   CARDIAC CATHETERIZATION N/A 01/22/2015   Procedure: Coronary Stent Intervention;  Surgeon: Marcina Millard, MD;  Location: [facility] INVASIVE CV LAB;  Service: Cardiovascular;  Laterality: N/A;   CATARACT EXTRACTION, BILATERAL     COLONOSCOPY WITH PROPOFOL N/A 12/08/2015   Procedure: COLONOSCOPY WITH PROPOFOL;  Surgeon: Christena Deem, MD;  Location: [facility] ENDOSCOPY;  Service: Endoscopy;  Laterality: N/A;   COLONOSCOPY WITH PROPOFOL N/A 12/09/2015   Procedure: COLONOSCOPY WITH PROPOFOL;  Surgeon: Christena Deem, MD;  Location: [facility] ENDOSCOPY;  Service: Endoscopy;  Laterality: N/A;   CORONARY ANGIOPLASTY     5 stents   CORONARY STENT PLACEMENT  2015   x5   CYSTOSCOPY N/A 09/11/2018   Procedure: CYSTOSCOPY;  Surgeon: Vanna Scotland, MD;  Location: [facility] ORS;  Service: Urology;  Laterality: N/A;   CYSTOSCOPY WITH BIOPSY N/A 12/21/2017   Procedure: CYSTOSCOPY WITH BIOPSY;  Surgeon: Vanna Scotland, MD;  Location: [facility] ORS;  Service: Urology;  Laterality: N/A;   ESOPHAGOGASTRODUODENOSCOPY (EGD) WITH PROPOFOL N/A 12/08/2015   Procedure: ESOPHAGOGASTRODUODENOSCOPY (EGD) WITH PROPOFOL;  Surgeon: Christena Deem, MD;  Location: [facility] ENDOSCOPY;  Service: Endoscopy;  Laterality: N/A;    ESOPHAGOGASTRODUODENOSCOPY (EGD) WITH PROPOFOL N/A 12/13/2017   Procedure: ESOPHAGOGASTRODUODENOSCOPY (EGD) WITH PROPOFOL;  Surgeon: Christena Deem, MD;  Location: [facility] ENDOSCOPY;  Service: Endoscopy;  Laterality: N/A;   EYE SURGERY     HERNIA REPAIR     kidney tumor remove     TRANSURETHRAL RESECTION OF BLADDER TUMOR WITH GYRUS (TURBT-GYRUS)  12/2013   UMBILICAL HERNIA REPAIR     urethral meatotomy     Patient Active Problem List   Diagnosis Date Noted   Class 2 obesity due to excess calories with body mass index (BMI) of 36.0 to 36.9 in adult 04/11/2020   Swelling of limb 03/28/2020   Lymphedema 03/28/2020   Trigeminal neuralgia 12/25/2019   Lower limb ulcer, calf, left, limited to breakdown of skin (HCC) 12/25/2019   OSA (obstructive sleep apnea) 01/23/2019   Acute systolic CHF (congestive heart failure) (HCC) 12/12/2018   Bruising 07/10/2018   Diet-controlled type 2 diabetes mellitus (HCC) 03/24/2018   Microalbuminuria 03/24/2018   Dizziness 11/17/2017   Simple chronic bronchitis (HCC) 09/22/2017   SOBOE (shortness of breath on exertion) 02/18/2017   Chronic midline low back pain without sciatica 12/29/2016   Periodic limb movement disorder 12/29/2016   Benign essential tremor 06/22/2016   Pure hypercholesterolemia 06/20/2015   Erectile dysfunction due to arterial insufficiency 06/16/2015   Unstable angina (HCC) 01/22/2015   Abdominal aortic 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 rupture (HCC) 12/31/2014   Benign prostatic hypertrophy without urinary obstruction 07/31/2014   Enlarged prostate 07/31/2014   Benign essential HTN 06/11/2014   Carotid artery narrowing 02/08/2014   Carotid artery obstruction 02/08/2014   Bilateral carotid artery stenosis 02/08/2014   Chest pain 08/20/2013   Bilateral cataracts 05/16/2013   Cataract 05/16/2013   Clinical depression 12/12/2012   Diabetes mellitus, type 2 (HCC) 12/12/2012   Combined fat and carbohydrate induced hyperlipemia 12/12/2012    Major depressive disorder, single episode, unspecified 12/12/2012   Acid reflux 11/09/2012   Presence of stent in coronary artery 11/09/2012   BP (high blood pressure) 11/09/2012   Healed myocardial infarct 11/09/2012   Gastro-esophageal reflux disease without esophagitis 11/09/2012   History of cardiovascular surgery 11/09/2012   Fothergill's neuralgia 08/07/2012   Swelling of testicle 04/06/2012   Disorder of male genital organ 04/06/2012   Swelling of the testicles 04/06/2012   Arteriosclerosis of coronary artery 11/16/2011   CAD in native artery 11/16/2011   Fatigue 06/04/2011   Avitaminosis D 11/30/2010    REFERRING DIAG: Repeated falls   THERAPY DIAG:   Abnormality of gait and mobility  Difficulty in walking, not elsewhere classified  Muscle weakness (generalized)  Other abnormalities of gait and mobility  Rationale for Evaluation and Treatment Rehabilitation  PERTINENT HISTORY: Patient is a 76 year old male with referral for Physical Therapy with diagnosis of Parkinsons  disease and history of falling. He reports he diagnosed last year with Parkinsons and has improved his tremors with use of medication but reports worsening shuffling and difficulty with mobility. Patient has past medical history signifiant for Parkinsons; Arthritis, bladder cancer, Trigemic Neuralgia. He lives with wife in 1 level home and current using cane with reported recent fall.   PRECAUTIONS: Falls  SUBJECTIVE: Patient reports he had another fall this week- while walking through the doors at [facility]- the doors shut on him causing him to fall. Reports increased right shoulder pain- Did not seek any emergency services and wife was able to help him up.   PAIN:  Are you having pain? Yes: NPRS scale: 6/10 Pain location: right shoulder Pain description: ache/sharp at times with movement Aggravating factors: Overhead reaching Relieving factors: Rest     TODAY'S TREATMENT:   08/20/2021  Therex:    Nustep - LE and UE (for trunk mobility)  L0 for 2 min L2 for 4 min No report of increased Right UE pain  Seated hip march- 2.5 lb AW alt LE 2 sets x 20 reps  Seated LAQ- 2.5 AW alt LE x 20 reps  Standing foot taps to step 10 x ea step anteriorly -Laterally x 10 ea LE  Seated PWR! Up for part to whole practice of STS  PWR! STS x 10  -cues for UE movement and full STS each time.   Forward/backward step up and over orange hurdle with 2.5 AW x 15  Side step up and over orange hurdle with 2.5 AW   Education provided throughout session via VC/TC and demonstration to facilitate movement at target joints and correct muscle activation for all testing and exercises performed.     PATIENT EDUCATION: Education details: Exercise technique Person educated: Patient Education method: Explanation, Demonstration, Tactile cues, and Verbal cues Education comprehension: verbalized understanding, returned demonstration, verbal cues required, tactile cues required, and needs further education   HOME EXERCISE PROGRAM: No updates today   PT Short Term Goals -       PT SHORT TERM GOAL #1   Title Pt will be independent with INITIAL  HEP in order to improve strength and balance in order to decrease fall risk and improve function at home and work.    Baseline 04/07/2021= No formal HEP in place. 05/11/2021= Patient reports doing pretty good- compliant with walking program and his stretching/strengthening.    Time 6    Period Weeks    Status Achieved    Target Date 05/19/21      PT SHORT TERM GOAL #2   Title Pt will decrease 5TSTS by at least 3 seconds in order to demonstrate clinically significant improvement in LE strength.    Baseline 04/07/2021= 25 sec; 05/11/2021= 18.65 sec    Time 6    Period Weeks    Status Achieved    Target Date 06/30/21              PT Long Term Goals -       PT LONG TERM GOAL #1   Title Pt will be independent with FINAL HEP in order  to improve strength and balance in order to decrease fall risk and improve function at home and work.    Baseline 04/07/2021= No formal HEP in place. 07/01/2021=Patient reports performing walking and some standing LE strengthening exercises as instructed and states no questions at this time.    Time 12    Period Weeks    Status  Partially Met    Target Date 09/23/21      PT LONG TERM GOAL #2   Title Pt will improve FOTO to target score of 45% to display perceived improvements in ability to complete ADL's.    Baseline 2/7/22023= 41%; FOTO=53%    Time 12    Period Weeks    Status Achieved    Target Date 06/30/21      PT LONG TERM GOAL #3   Title Pt will decrease 5TSTS by at least 5 seconds in order to demonstrate clinically significant improvement in LE strength.    Baseline 04/07/2021= 25 sec; 05/11/2021= 18.65 sec without UE support; 4/10= 13.76 sec without UE support    Time 12    Period Weeks    Status Achieved    Target Date 06/30/21      PT LONG TERM GOAL #4   Title Pt will decrease TUG to below 17 seconds/decrease in order to demonstrate decreased fall risk.    Baseline 04/07/2021= 21 sec with SPC; 05/11/2021= 18.85 sec with use of cane; 07/21/2021=18.88 sec  avg with use of cane    Time 12    Period Weeks    Status Partially Met    Target Date 09/23/21      PT LONG TERM GOAL #5   Title Pt will increase by at least 0.15 m/s in order to demonstrate clinically significant improvement in community ambulation.    Baseline 04/07/2021= 0.56 m/s using SPC; 05/11/2021= 0.54 m/s using SPC; 07/01/2021= 0.6 m/s    Time 12    Period Weeks    Status New    Target Date 09/23/21      PT LONG TERM GOAL #6   Title Pt will increase by at least 56m (116ft) in order to demonstrate clinically significant improvement in cardiopulmonary endurance and community ambulation    Baseline 06/08/2021= 600 ft. in 5 min- Stopped due to fatigue using 4WW; 07/01/2021= 665 in 5 min 45 sec using 4WW    Time 4     Period Weeks    Status On-going    Target Date 09/23/21              Plan - 08/20/21 0939     Clinical Impression Statement Continued with current plan of care as laid out in evaluation and recent prior sessions. Pt remains motivated to advance progress toward goals in order to maximize independence and safety at home. Pt requires high level assistance and cuing for completion of exercises in order to provide adequate level of stimulation and perturbation. Pt closely monitored throughout session for safe vitals response and to maximize patient safety during interventions. Pt continues to demonstrate progress toward goals AEB progression of some interventions this date either in volume or intensity.    Personal Factors and Comorbidities Comorbidity 3+    Comorbidities arthritis, bladder cancer, Trigeminal Neuralgia    Examination-Activity Limitations Caring for Others;Carry;Continence;Lift;Squat;Stairs;Stand    Examination-Participation Restrictions Community Activity;Yard Work    Conservation officer, historic buildings Evolving/Moderate complexity    Rehab Potential Good    PT Frequency 2x / week    PT Duration 12 weeks    PT Treatment/Interventions ADLs/Self Care Home Management;Cryotherapy;Canalith Repostioning;Electrical Stimulation;Moist Heat;DME Instruction;Gait training;Stair training;Functional mobility training;Therapeutic activities;Therapeutic exercise;Balance training;Neuromuscular re-education;Patient/family education;Manual techniques;Passive range of motion;Dry needling;Vestibular    PT Next Visit Plan LE strength, improve step length with ambulation, muscle tissue lengthening    PT Home Exercise Plan Access Code: [email or web address]  Norman Herrlich, PT 08/25/2021, 3:17 PM

## 2021-08-27 ENCOUNTER — Ambulatory Visit: Payer: Medicare HMO

## 2021-08-27 DIAGNOSIS — M545 Low back pain, unspecified: Secondary | ICD-10-CM

## 2021-08-27 DIAGNOSIS — R262 Difficulty in walking, not elsewhere classified: Secondary | ICD-10-CM

## 2021-08-27 DIAGNOSIS — M6281 Muscle weakness (generalized): Secondary | ICD-10-CM

## 2021-08-27 DIAGNOSIS — R2681 Unsteadiness on feet: Secondary | ICD-10-CM

## 2021-08-27 DIAGNOSIS — R269 Unspecified abnormalities of gait and mobility: Secondary | ICD-10-CM

## 2021-08-27 DIAGNOSIS — R2689 Other abnormalities of gait and mobility: Secondary | ICD-10-CM

## 2021-08-27 DIAGNOSIS — R278 Other lack of coordination: Secondary | ICD-10-CM

## 2021-08-27 NOTE — Therapy (Signed)
OUTPATIENT PHYSICAL THERAPY TREATMENT NOTE   Patient Name: [name] MRN: [id number] DOB:02/12/1946, 76 y.o., male Today's Date: 08/28/2021  PCP: [name]. [name], [place] PROVIDER: Dr. Gurney Maxin   PT End of Session - 08/28/21 2145     Visit Number 39    Number of Visits 50    Date for PT Re-Evaluation 09/23/21    Authorization Time Period Initial Certification= 11/07/2424- 06/30/2021; PN on 8/34/1962; Recert 04/02/9796-11/20/1939    Progress Note Due on Visit 37    PT Start Time 7408    PT Stop Time 1230    PT Time Calculation (min) 45 min    Equipment Utilized During Treatment Gait belt    Activity Tolerance Patient tolerated treatment well    Behavior During Therapy [facility] for tasks assessed/performed              Past Medical History:  Diagnosis Date   Anxiety    Arteriosclerosis of coronary artery 11/16/2011   Overview:  Stent 10/2011 stent rca 2015 with collaterals to lad which is chronically occluded    Benign enlargement of prostate    Benign essential HTN 06/11/2014   Benign prostatic hypertrophy without urinary obstruction 07/31/2014   Bilateral cataracts 05/16/2013   Overview:  Dr. [facility]     Bone spur of foot    Left   BP (high blood pressure) 11/09/2012   Cancer ([place])    skin (forehead) and bladder   Carotid artery narrowing 02/08/2014   Depression    Detrusor hypertrophy    Diabetes ([place])    Diabetes mellitus, type 2 ([place]) 12/12/2012   Diverticulosis    Dyspnea    Esophageal reflux    Esophageal reflux    Fothergill's neuralgia 08/07/2012   Overview:  [facility]    Gastritis    GERD (gastroesophageal reflux disease)    Headache    cluster headaches   Healed myocardial infarct 11/09/2012   Hearing loss in left ear    Heart disease    Hematuria    Hemorrhoids    History of hiatal hernia 12/14/2017   small    Hypercholesteremia    Lesion of bladder    Myocardial infarct (HCC)    Presence of stent in coronary artery 11/09/2012    Rectal bleeding    Trigeminal neuralgia    Trigeminal neuralgia    Valvular heart disease    Vitamin D deficiency    Past Surgical History:  Procedure Laterality Date   APPENDECTOMY     BOTOX INJECTION N/A 12/21/2017   Procedure: Bladder BOTOX INJECTION;  Surgeon: [name], MD;  Location: [facility];  Service: Urology;  Laterality: N/A;   BOTOX INJECTION N/A 09/11/2018   Procedure: Bladder BOTOX INJECTION;  Surgeon: [name], MD;  Location: [facility];  Service: Urology;  Laterality: N/A;   CARDIAC CATHETERIZATION     CARDIAC CATHETERIZATION N/A 01/22/2015   Procedure: Left Heart Cath;  Surgeon: Corey Skains, MD;  Location: [place] [facility];  Service: Cardiovascular;  Laterality: N/A;   CARDIAC CATHETERIZATION N/A 01/22/2015   Procedure: Coronary Stent Intervention;  Surgeon: Isaias Cowman, MD;  Location: [place] [facility];  Service: Cardiovascular;  Laterality: N/A;   CATARACT EXTRACTION, BILATERAL     COLONOSCOPY WITH PROPOFOL N/A 12/08/2015   Procedure: COLONOSCOPY WITH PROPOFOL;  Surgeon: Lollie Sails, MD;  Location: [facility];  Service: Endoscopy;  Laterality: N/A;   COLONOSCOPY WITH PROPOFOL N/A 12/09/2015   Procedure: COLONOSCOPY WITH  PROPOFOL;  Surgeon: Lollie Sails, MD;  Location: [facility] END[facility];  Service: Endoscopy;  Laterality: N/A;   CORONARY ANGIOPLASTY     5 stents   CORONARY STENT PLACEMENT  2015   x5   CYSTOSCOPY N/A 09/11/2018   Procedure: CYSTOSCOPY;  Surgeon: [name], MD;  Location: [facility];  Service: Urology;  Laterality: N/A;   CYSTOSCOPY WITH BIOPSY N/A 12/21/2017   Procedure: CYSTOSCOPY WITH BIOPSY;  Surgeon: [name], MD;  Location: [facility];  Service: Urology;  Laterality: N/A;   ESOPHAGOGASTRODUODENOSCOPY (EGD) WITH PROPOFOL N/A 12/08/2015   Procedure: ESOPHAGOGASTRODUODENOSCOPY (EGD) WITH PROPOFOL;  Surgeon: Lollie Sails, MD;  Location: [facility] ENDOSCOPY;  Service: Endoscopy;  Laterality: N/A;    ESOPHAGOGASTRODUODENOSCOPY (EGD) WITH PROPOFOL N/A 12/13/2017   Procedure: ESOPHAGOGASTRODUODENOSCOPY (EGD) WITH PROPOFOL;  Surgeon: Lollie Sails, MD;  Location: [facility] ENDOSCOPY;  Service: Endoscopy;  Laterality: N/A;   EYE SURGERY     HERNIA REPAIR     kidney tumor remove     TRANSURETHRAL RESECTION OF BLADDER TUMOR WITH GYRUS (TURBT-GYRUS)  70/2637   UMBILICAL HERNIA REPAIR     urethral meatotomy     Patient Active Problem List   Diagnosis Date Noted   Class 2 obesity due to excess calories with body mass index (BMI) of 36.0 to 36.9 in adult 04/11/2020   Swelling of limb 03/28/2020   Lymphedema 03/28/2020   Trigeminal neuralgia 12/25/2019   Lower limb ulcer, calf, left, limited to breakdown of skin ([place]) 12/25/2019   OSA (obstructive sleep apnea) 85/88/5027   Acute systolic CHF (congestive heart failure) ([place]) 12/12/2018   Bruising 07/10/2018   Diet-controlled type 2 diabetes mellitus ([place]) 03/24/2018   Microalbuminuria 03/24/2018   Dizziness 11/17/2017   Simple chronic bronchitis ([place]) 09/22/2017   SOBOE (shortness of breath on exertion) 02/18/2017   Chronic midline low back pain without sciatica 12/29/2016   Periodic limb movement disorder 12/29/2016   Benign essential tremor 06/22/2016   Pure hypercholesterolemia 06/20/2015   Erectile dysfunction due to arterial insufficiency 06/16/2015   Unstable angina ([place]) 01/22/2015   Abdominal aortic 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 rupture ([place]) 12/31/2014   Benign prostatic hypertrophy without urinary obstruction 07/31/2014   Enlarged prostate 07/31/2014   Benign essential HTN 06/11/2014   Carotid artery narrowing 02/08/2014   Carotid artery obstruction 02/08/2014   Bilateral carotid artery stenosis 02/08/2014   Chest pain 08/20/2013   Bilateral cataracts 05/16/2013   Cataract 05/16/2013   Clinical depression 12/12/2012   Diabetes mellitus, type 2 ([place]) 12/12/2012   Combined fat and carbohydrate induced hyperlipemia 12/12/2012    Major depressive disorder, single episode, unspecified 12/12/2012   Acid reflux 11/09/2012   Presence of stent in coronary artery 11/09/2012   BP (high blood pressure) 11/09/2012   Healed myocardial infarct 11/09/2012   Gastro-esophageal reflux disease without esophagitis 11/09/2012   History of cardiovascular surgery 11/09/2012   Fothergill's neuralgia 08/07/2012   Swelling of testicle 04/06/2012   Disorder of male genital organ 04/06/2012   Swelling of the testicles 04/06/2012   Arteriosclerosis of coronary artery 11/16/2011   CAD in native artery 11/16/2011   Fatigue 06/04/2011   Avitaminosis D 11/30/2010    REFERRING DIAG: Repeated falls   THERAPY DIAG:   Abnormality of gait and mobility  Difficulty in walking, not elsewhere classified  Muscle weakness (generalized)  Other abnormalities of gait and mobility  Other lack of coordination  Unsteadiness on feet  Chronic bilateral low back pain without sciatica  Rationale for Evaluation and Treatment Rehabilitation  PERTINENT HISTORY: Patient is a 76 year old male with referral for Physical Therapy with diagnosis of Parkinsons disease and history of falling. He reports he diagnosed last year with Parkinsons and has improved his tremors with use of medication but reports worsening shuffling and difficulty with mobility. Patient has past medical history signifiant for Parkinsons; Arthritis, bladder cancer, Trigemic Neuralgia. He lives with wife in 1 level home and current using cane with reported recent fall.   PRECAUTIONS: Falls  SUBJECTIVE: Patient reports legs feeling better today but sore after last visit.   PAIN:  No    TODAY'S TREATMENT:   Therex:    Nustep - LE  only - Monitored continuously for  L0 for 2 min L2 for 2 L4 for 2 min L2 for 1 min No report of pain Seated hip march- no weight today alt LE x 20 reps  Seated LAQ- no weight with 2 sec hold alt LE x 20 reps  Standing lumbar flex/ext x 12  reps Standing 360 deg turning x 5 each- More difficulty turning to right today.   Standing lunge with alt Upper trunk twist and raise opp UE toward ceiling x 15 reps.   Seated Lean forward with outstretched arms to prep for sit to stand x 10 reps  Standing in // bars performed 360 deg turning x 5 (to left then to right) VC for step cadence-   Standing foot taps to step 10 x ea step anteriorly -Laterally x 10 ea LE.   STS x 10  -cues for UE movement - to keep arms out.   Forward/backward step up and over 1/2 foam  x 15 Side stepping up and over 1/2 foam x 10 reps.   Education provided throughout session via VC/TC and demonstration to facilitate movement at target joints and correct muscle activation for all testing and exercises performed.     PATIENT EDUCATION: Education details: Exercise technique Person educated: Patient Education method: Explanation, Demonstration, Tactile cues, and Verbal cues Education comprehension: verbalized understanding, returned demonstration, verbal cues required, tactile cues required, and needs further education   HOME EXERCISE PROGRAM: No updates today   PT Short Term Goals -       PT SHORT TERM GOAL #1   Title Pt will be independent with INITIAL  HEP in order to improve strength and balance in order to decrease fall risk and improve function at home and work.    Baseline 04/07/2021= No formal HEP in place. 05/11/2021= Patient reports doing pretty good- compliant with walking program and his stretching/strengthening.    Time 6    Period Weeks    Status Achieved    Target Date 05/19/21      PT SHORT TERM GOAL #2   Title Pt will decrease 5TSTS by at least 3 seconds in order to demonstrate clinically significant improvement in LE strength.    Baseline 04/07/2021= 25 sec; 05/11/2021= 18.65 sec    Time 6    Period Weeks    Status Achieved    Target Date 06/30/21              PT Long Term Goals -       PT LONG TERM GOAL #1   Title Pt  will be independent with FINAL HEP in order to improve strength and balance in order to decrease fall risk and improve function at home and work.    Baseline 04/07/2021= No formal HEP in place. 07/01/2021=Patient reports performing walking and some standing LE  strengthening exercises as instructed and states no questions at this time.    Time 12    Period Weeks    Status Partially Met    Target Date 09/23/21      PT LONG TERM GOAL #2   Title Pt will improve FOTO to target score of 45% to display perceived improvements in ability to complete ADL's.    Baseline 2/7/22023= 41%; FOTO=53%    Time 12    Period Weeks    Status Achieved    Target Date 06/30/21      PT LONG TERM GOAL #3   Title Pt will decrease 5TSTS by at least 5 seconds in order to demonstrate clinically significant improvement in LE strength.    Baseline 04/07/2021= 25 sec; 05/11/2021= 18.65 sec without UE support; 4/10= 13.76 sec without UE support    Time 12    Period Weeks    Status Achieved    Target Date 06/30/21      PT LONG TERM GOAL #4   Title Pt will decrease TUG to below 17 seconds/decrease in order to demonstrate decreased fall risk.    Baseline 04/07/2021= 21 sec with SPC; 05/11/2021= 18.85 sec with use of cane; 07/21/2021=18.88 sec  avg with use of cane    Time 12    Period Weeks    Status Partially Met    Target Date 09/23/21      PT LONG TERM GOAL #5   Title Pt will increase 10MWT by at least 0.15 m/s in order to demonstrate clinically significant improvement in community ambulation.    Baseline 04/07/2021= 0.56 m/s using SPC; 05/11/2021= 0.54 m/s using SPC; 07/01/2021= 0.6 m/s    Time 12    Period Weeks    Status New    Target Date 09/23/21      PT LONG TERM GOAL #6   Title Pt will increase 6MWT by at least 46m(164f in order to demonstrate clinically significant improvement in cardiopulmonary endurance and community ambulation    Baseline 06/08/2021= 600 ft. in 5 min- Stopped due to fatigue using 4WW; 07/01/2021=  665 in 5 min 45 sec using 4WW    Time 4    Period Weeks    Status On-going    Target Date 09/23/21              Plan - 08/20/21 0939     Clinical Impression Statement Patient presents with good motivation for todays session and performed well with standing LE strengthening with fatigue as limiting factor. He presents with increased trunk stiffness yet did improve with practice. Patient will benefit from skilled PT services to address these deficits, improve balance, and decrease risk for future falls    Personal Factors and Comorbidities Comorbidity 3+    Comorbidities arthritis, bladder cancer, Trigeminal Neuralgia    Examination-Activity Limitations Caring for Others;Carry;Continence;Lift;Squat;Stairs;Stand    Examination-Participation Restrictions Community Activity;Yard Work    [employer] officervolving/Moderate complexity    Rehab Potential Good    PT Frequency 2x / week    PT Duration 12 weeks    PT Treatment/Interventions ADLs/Self Care Home Management;Cryotherapy;Canalith Repostioning;Electrical Stimulation;Moist Heat;DME Instruction;Gait training;Stair training;Functional mobility training;Therapeutic activities;Therapeutic exercise;Balance training;Neuromuscular re-education;Patient/family education;Manual techniques;Passive range of motion;Dry needling;Vestibular    PT Next Visit Plan LE strength, improve step length with ambulation, muscle tissue lengthening    PT Home Exercise Plan Access Code: [email or web address] Ahmet Schank, PT 08/28/2021,  9:49 PM

## 2021-09-02 ENCOUNTER — Ambulatory Visit: Payer: Medicare HMO | Attending: Neurology

## 2021-09-02 DIAGNOSIS — M6281 Muscle weakness (generalized): Secondary | ICD-10-CM | POA: Insufficient documentation

## 2021-09-02 DIAGNOSIS — R278 Other lack of coordination: Secondary | ICD-10-CM | POA: Diagnosis present

## 2021-09-02 DIAGNOSIS — R2681 Unsteadiness on feet: Secondary | ICD-10-CM | POA: Insufficient documentation

## 2021-09-02 DIAGNOSIS — G8929 Other chronic pain: Secondary | ICD-10-CM | POA: Diagnosis present

## 2021-09-02 DIAGNOSIS — M545 Low back pain, unspecified: Secondary | ICD-10-CM | POA: Insufficient documentation

## 2021-09-02 DIAGNOSIS — R269 Unspecified abnormalities of gait and mobility: Secondary | ICD-10-CM | POA: Diagnosis present

## 2021-09-02 DIAGNOSIS — R2689 Other abnormalities of gait and mobility: Secondary | ICD-10-CM | POA: Diagnosis present

## 2021-09-02 DIAGNOSIS — R262 Difficulty in walking, not elsewhere classified: Secondary | ICD-10-CM | POA: Insufficient documentation

## 2021-09-02 NOTE — Therapy (Signed)
OUTPATIENT PHYSICAL THERAPY TREATMENT NOTE/Physical Therapy Progress Note   Dates of reporting period  07/14/2021   to   09/02/2021    Patient Name: [name] MRN: [id number] DOB:[birth year], 76 y.o., male Today's Date: 09/02/2021  PCP: [name]. [name], [place] PROVIDER: Dr. Gurney Maxin   PT End of Session - 09/02/21 0922     Visit Number 40    Number of Visits 50    Date for PT Re-Evaluation 09/23/21    Authorization Time Period Initial Certification= 11/05/7891- 06/30/2021; PN on 10/08/1749; Recert 0/04/5850-7/78/2423    Progress Note Due on Visit 3    PT Start Time 0929    PT Stop Time 1011    PT Time Calculation (min) 42 min    Equipment Utilized During Treatment Gait belt    Activity Tolerance Patient tolerated treatment well    Behavior During Therapy [facility] for tasks assessed/performed               Past Medical History:  Diagnosis Date   Anxiety    Arteriosclerosis of coronary artery 11/16/2011   Overview:  Stent 10/2011 stent rca 2015 with collaterals to lad which is chronically occluded    Benign enlargement of prostate    Benign essential HTN 06/11/2014   Benign prostatic hypertrophy without urinary obstruction 07/31/2014   Bilateral cataracts 05/16/2013   Overview:  Dr. [facility]     Bone spur of foot    Left   BP (high blood pressure) 11/09/2012   Cancer ([place])    skin (forehead) and bladder   Carotid artery narrowing 02/08/2014   Depression    Detrusor hypertrophy    Diabetes ([place])    Diabetes mellitus, type 2 ([place]) 12/12/2012   Diverticulosis    Dyspnea    Esophageal reflux    Esophageal reflux    Fothergill's neuralgia 08/07/2012   Overview:  [facility]    Gastritis    GERD (gastroesophageal reflux disease)    Headache    cluster headaches   Healed myocardial infarct 11/09/2012   Hearing loss in left ear    Heart disease    Hematuria    Hemorrhoids    History of hiatal hernia 12/14/2017   small    Hypercholesteremia    Lesion  of bladder    Myocardial infarct (HCC)    Presence of stent in coronary artery 11/09/2012   Rectal bleeding    Trigeminal neuralgia    Trigeminal neuralgia    Valvular heart disease    Vitamin D deficiency    Past Surgical History:  Procedure Laterality Date   APPENDECTOMY     BOTOX INJECTION N/A 12/21/2017   Procedure: Bladder BOTOX INJECTION;  Surgeon: [name], MD;  Location: [facility] ORS;  Service: Urology;  Laterality: N/A;   BOTOX INJECTION N/A 09/11/2018   Procedure: Bladder BOTOX INJECTION;  Surgeon: [name], MD;  Location: [facility] ORS;  Service: Urology;  Laterality: N/A;   CARDIAC CATHETERIZATION     CARDIAC CATHETERIZATION N/A 01/22/2015   Procedure: Left Heart Cath;  Surgeon: Corey Skains, MD;  Location: [place] CV LAB;  Service: Cardiovascular;  Laterality: N/A;   CARDIAC CATHETERIZATION N/A 01/22/2015   Procedure: Coronary Stent Intervention;  Surgeon: Isaias Cowman, MD;  Location: [place] CV LAB;  Service: Cardiovascular;  Laterality: N/A;   CATARACT EXTRACTION, BILATERAL     COLONOSCOPY WITH PROPOFOL N/A 12/08/2015   Procedure: COLONOSCOPY WITH PROPOFOL;  Surgeon: Lollie Sails, MD;  Location: [facility]  ENDOSCOPY;  Service: Endoscopy;  Laterality: N/A;   COLONOSCOPY WITH PROPOFOL N/A 12/09/2015   Procedure: COLONOSCOPY WITH PROPOFOL;  Surgeon: Lollie Sails, MD;  Location: [facility] ENDOSCOPY;  Service: Endoscopy;  Laterality: N/A;   CORONARY ANGIOPLASTY     5 stents   CORONARY STENT PLACEMENT  2015   x5   CYSTOSCOPY N/A 09/11/2018   Procedure: CYSTOSCOPY;  Surgeon: [name], MD;  Location: [facility] ORS;  Service: Urology;  Laterality: N/A;   CYSTOSCOPY WITH BIOPSY N/A 12/21/2017   Procedure: CYSTOSCOPY WITH BIOPSY;  Surgeon: [name], MD;  Location: [facility] ORS;  Service: Urology;  Laterality: N/A;   ESOPHAGOGASTRODUODENOSCOPY (EGD) WITH PROPOFOL N/A 12/08/2015   Procedure: ESOPHAGOGASTRODUODENOSCOPY (EGD) WITH PROPOFOL;  Surgeon:  Lollie Sails, MD;  Location: [facility] ENDOSCOPY;  Service: Endoscopy;  Laterality: N/A;   ESOPHAGOGASTRODUODENOSCOPY (EGD) WITH PROPOFOL N/A 12/13/2017   Procedure: ESOPHAGOGASTRODUODENOSCOPY (EGD) WITH PROPOFOL;  Surgeon: Lollie Sails, MD;  Location: [facility] ENDOSCOPY;  Service: Endoscopy;  Laterality: N/A;   EYE SURGERY     HERNIA REPAIR     kidney tumor remove     TRANSURETHRAL RESECTION OF BLADDER TUMOR WITH GYRUS (TURBT-GYRUS)  94/5038   UMBILICAL HERNIA REPAIR     urethral meatotomy     Patient Active Problem List   Diagnosis Date Noted   Class 2 obesity due to excess calories with body mass index (BMI) of 36.0 to 36.9 in adult 04/11/2020   Swelling of limb 03/28/2020   Lymphedema 03/28/2020   Trigeminal neuralgia 12/25/2019   Lower limb ulcer, calf, left, limited to breakdown of skin ([place]) 12/25/2019   OSA (obstructive sleep apnea) 88/28/0034   Acute systolic CHF (congestive heart failure) ([place]) 12/12/2018   Bruising 07/10/2018   Diet-controlled type 2 diabetes mellitus ([place]) 03/24/2018   Microalbuminuria 03/24/2018   Dizziness 11/17/2017   Simple chronic bronchitis ([place]) 09/22/2017   SOBOE (shortness of breath on exertion) 02/18/2017   Chronic midline low back pain without sciatica 12/29/2016   Periodic limb movement disorder 12/29/2016   Benign essential tremor 06/22/2016   Pure hypercholesterolemia 06/20/2015   Erectile dysfunction due to arterial insufficiency 06/16/2015   Unstable angina ([place]) 01/22/2015   Abdominal aortic 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 rupture ([place]) 12/31/2014   Benign prostatic hypertrophy without urinary obstruction 07/31/2014   Enlarged prostate 07/31/2014   Benign essential HTN 06/11/2014   Carotid artery narrowing 02/08/2014   Carotid artery obstruction 02/08/2014   Bilateral carotid artery stenosis 02/08/2014   Chest pain 08/20/2013   Bilateral cataracts 05/16/2013   Cataract 05/16/2013   Clinical depression 12/12/2012   Diabetes mellitus,  type 2 ([place]) 12/12/2012   Combined fat and carbohydrate induced hyperlipemia 12/12/2012   Major depressive disorder, single episode, unspecified 12/12/2012   Acid reflux 11/09/2012   Presence of stent in coronary artery 11/09/2012   BP (high blood pressure) 11/09/2012   Healed myocardial infarct 11/09/2012   Gastro-esophageal reflux disease without esophagitis 11/09/2012   History of cardiovascular surgery 11/09/2012   Fothergill's neuralgia 08/07/2012   Swelling of testicle 04/06/2012   Disorder of male genital organ 04/06/2012   Swelling of the testicles 04/06/2012   Arteriosclerosis of coronary artery 11/16/2011   CAD in native artery 11/16/2011   Fatigue 06/04/2011   Avitaminosis D 11/30/2010    REFERRING DIAG: Repeated falls   THERAPY DIAG:   Difficulty in walking, not elsewhere classified  Muscle weakness (generalized)  Rationale for Evaluation and Treatment Rehabilitation  PERTINENT HISTORY: Patient is a 76 year old male with  referral for Physical Therapy with diagnosis of Parkinsons disease and history of falling. He reports he diagnosed last year with Parkinsons and has improved his tremors with use of medication but reports worsening shuffling and difficulty with mobility. Patient has past medical history signifiant for Parkinsons; Arthritis, bladder cancer, Trigemic Neuralgia. He lives with wife in 1 level home and current using cane with reported recent fall.   PRECAUTIONS: Falls  SUBJECTIVE: Pt reports no pain currently. Pt reports no stumbles/falls. Pt feels indep with HEP but not doing it too often. Pt reports he has not been using his AD when ambulating in his home recently and has been furniture walking.   PAIN:  No   TODAY'S TREATMENT:   09/02/2021: Goals reassessed on this date (see goal section for full details). Pt instructed throughout in technique with all testing and provided education on indications of test scores.   Therex:    TUG: with 23.41 sec  with cane, 22.3 sec second trial; avg 22.85 sec  10MWT: 0.48 m/s without AD  6MWT: 367 ft with QC  PT instructed pt to continue using an AD at home due to reports of furniture walking and stumbles per pt and his spouse. Pt and spouse verbalized understanding.   Seated LAQ- no weight with 2 sec hold alt LE x 20 reps x 2 sets.   STS 10x, 12x  Standing foot taps to 6" step 2x20 x alt LE step anteriorly -Laterally x 20x alt LE. Pt notably fatigued following intervention. Requires prolonged seated rest interval following intervention.   LAQ with 2.5# AW donned 20x alt LE    Education provided throughout session via VC/TC and demonstration to facilitate movement at target joints and correct muscle activation for all testing and exercises performed.     PATIENT EDUCATION: Education details: Exercise technique, instruction in use of AD (see note for details) Person educated: Patient Education method: Explanation, Demonstration, Tactile cues, and Verbal cues Education comprehension: verbalized understanding, returned demonstration, verbal cues required, tactile cues required, and needs further education   HOME EXERCISE PROGRAM: No updates today   PT Short Term Goals -       PT SHORT TERM GOAL #1   Title Pt will be independent with INITIAL  HEP in order to improve strength and balance in order to decrease fall risk and improve function at home and work.    Baseline 04/07/2021= No formal HEP in place. 05/11/2021= Patient reports doing pretty good- compliant with walking program and his stretching/strengthening.    Time 6    Period Weeks    Status Achieved    Target Date 05/19/21      PT SHORT TERM GOAL #2   Title Pt will decrease 5TSTS by at least 3 seconds in order to demonstrate clinically significant improvement in LE strength.    Baseline 04/07/2021= 25 sec; 05/11/2021= 18.65 sec    Time 6    Period Weeks    Status Achieved    Target Date 06/30/21              PT Long Term  Goals -       PT LONG TERM GOAL #1   Title Pt will be independent with FINAL HEP in order to improve strength and balance in order to decrease fall risk and improve function at home and work.    Baseline 04/07/2021= No formal HEP in place. 07/01/2021=Patient reports performing walking and some standing LE strengthening exercises as instructed and states no questions  at this time.  09/02/21: Pt reports he feels indep with exercises but is not performing HEP as much as he should   Time 12    Period Weeks    Status Partially Met    Target Date 09/23/21      PT LONG TERM GOAL #2   Title Pt will improve FOTO to target score of 45% to display perceived improvements in ability to complete ADL's.    Baseline 2/7/22023= 41%; FOTO=53%    Time 12    Period Weeks    Status Achieved    Target Date 06/30/21      PT LONG TERM GOAL #3   Title Pt will decrease 5TSTS by at least 5 seconds in order to demonstrate clinically significant improvement in LE strength.    Baseline 04/07/2021= 25 sec; 05/11/2021= 18.65 sec without UE support; 4/10= 13.76 sec without UE support    Time 12    Period Weeks    Status Achieved    Target Date 06/30/21      PT LONG TERM GOAL #4   Title Pt will decrease TUG to below 17 seconds/decrease in order to demonstrate decreased fall risk.    Baseline 04/07/2021= 21 sec with SPC; 05/11/2021= 18.85 sec with use of cane; 07/21/2021=18.88 sec  avg with use of cane; 7/5: 22.85 sec with QC   Time 12    Period Weeks    Status Partially Met    Target Date 09/23/21      PT LONG TERM GOAL #5   Title Pt will increase 10MWT by at least 0.15 m/s in order to demonstrate clinically significant improvement in community ambulation.    Baseline 04/07/2021= 0.56 m/s using SPC; 05/11/2021= 0.54 m/s using SPC; 07/01/2021= 0.6 m/s/ 7/5: 0.48 m/s without AD but with CGA   Time 12    Period Weeks    Status On-going    Target Date 09/23/21      PT LONG TERM GOAL #6   Title Pt will increase 6MWT by at least  87m(1632f in order to demonstrate clinically significant improvement in cardiopulmonary endurance and community ambulation    Baseline 06/08/2021= 600 ft. in 5 min- Stopped due to fatigue using 4WW; 07/01/2021= 665 in 5 min 45 sec using 4WW; 7/5: 367 ft with QC   Time 4    Period Weeks    Status On-going    Target Date 09/23/21              Plan - 08/20/21 0939     Clinical Impression Statement Goals retested for progress note. Pt with decrease in scores on TUG, 10MWT, and 6MWT on this date. This performance could be due to pt using a different AD or no AD with testing. For example, pt did not use an AD with 10MWT, and used a QC with TUG and 6MWT. While 6MWT distance did decrease, pt used QC instead of 4WW this time, increasing challenge for LE endurance and balance. Pt was also able to complete full 6MWT today without a rest break. Pt most limited by fatigue, but otherwise tolerated all interventions well and without pain. The patient will benefit from skilled PT services to address deficits in gait, strength, endurance and balance to decrease risk for future falls.    Personal Factors and Comorbidities Comorbidity 3+    Comorbidities arthritis, bladder cancer, Trigeminal Neuralgia    Examination-Activity Limitations Caring for Others;Carry;Continence;Lift;Squat;Stairs;Stand    Examination-Participation Restrictions Community Activity;Yard Work    StRisk analyst  Making Evolving/Moderate complexity    Rehab Potential Good    PT Frequency 2x / week    PT Duration 12 weeks    PT Treatment/Interventions ADLs/Self Care Home Management;Cryotherapy;Canalith Repostioning;Electrical Stimulation;Moist Heat;DME Instruction;Gait training;Stair training;Functional mobility training;Therapeutic activities;Therapeutic exercise;Balance training;Neuromuscular re-education;Patient/family education;Manual techniques;Passive range of motion;Dry needling;Vestibular    PT Next Visit Plan LE  strength, improve step length with ambulation, muscle tissue lengthening    PT Home Exercise Plan Access Code: [email or web address], PT 09/02/2021, 1:22 PM

## 2021-09-04 ENCOUNTER — Ambulatory Visit: Payer: Medicare HMO

## 2021-09-04 DIAGNOSIS — M6281 Muscle weakness (generalized): Secondary | ICD-10-CM

## 2021-09-04 DIAGNOSIS — R278 Other lack of coordination: Secondary | ICD-10-CM

## 2021-09-04 DIAGNOSIS — R262 Difficulty in walking, not elsewhere classified: Secondary | ICD-10-CM | POA: Diagnosis not present

## 2021-09-04 DIAGNOSIS — G8929 Other chronic pain: Secondary | ICD-10-CM

## 2021-09-04 DIAGNOSIS — R269 Unspecified abnormalities of gait and mobility: Secondary | ICD-10-CM

## 2021-09-04 DIAGNOSIS — R2681 Unsteadiness on feet: Secondary | ICD-10-CM

## 2021-09-04 DIAGNOSIS — R2689 Other abnormalities of gait and mobility: Secondary | ICD-10-CM

## 2021-09-04 NOTE — Therapy (Signed)
OUTPATIENT PHYSICAL THERAPY TREATMENT NOTE    Patient Name: [name] MRN: [id number] DOB:05/30/1945, 76 y.o., male Today's Date: 09/04/2021  PCP: Joan D. Britt, FNP REFERRING PROVIDER: Dr. Zachary Potter   PT End of Session - 09/04/21 1020     Visit Number 41    Number of Visits 50    Date for PT Re-Evaluation 09/23/21    Authorization Time Period Initial Certification= 04/07/2021- 06/30/2021; PN on 05/11/2021; Recert 07/01/2021-09/23/2021    Progress Note Due on Visit 40    PT Start Time 1015    PT Stop Time 1102    PT Time Calculation (min) 47 min    Equipment Utilized During Treatment Gait belt    Activity Tolerance Patient tolerated treatment well    Behavior During Therapy WFL for tasks assessed/performed               Past Medical History:  Diagnosis Date   Anxiety    Arteriosclerosis of coronary artery 11/16/2011   Overview:  Stent 10/2011 stent rca 2015 with collaterals to lad which is chronically occluded    Benign enlargement of prostate    Benign essential HTN 06/11/2014   Benign prostatic hypertrophy without urinary obstruction 07/31/2014   Bilateral cataracts 05/16/2013   Overview:  Dr. [facility]     Bone spur of foot    Left   BP (high blood pressure) 11/09/2012   Cancer (HCC)    skin (forehead) and bladder   Carotid artery narrowing 02/08/2014   Depression    Detrusor hypertrophy    Diabetes (HCC)    Diabetes mellitus, type 2 (HCC) 12/12/2012   Diverticulosis    Dyspnea    Esophageal reflux    Esophageal reflux    Fothergill's neuralgia 08/07/2012   Overview:  [facility]    Gastritis    GERD (gastroesophageal reflux disease)    Headache    cluster headaches   Healed myocardial infarct 11/09/2012   Hearing loss in left ear    Heart disease    Hematuria    Hemorrhoids    History of hiatal hernia 12/14/2017   small    Hypercholesteremia    Lesion of bladder    Myocardial infarct (HCC)    Presence of stent in coronary artery  11/09/2012   Rectal bleeding    Trigeminal neuralgia    Trigeminal neuralgia    Valvular heart disease    Vitamin D deficiency    Past Surgical History:  Procedure Laterality Date   APPENDECTOMY     BOTOX INJECTION N/A 12/21/2017   Procedure: Bladder BOTOX INJECTION;  Surgeon: Brandon, Ashley, MD;  Location: [facility];  Service: Urology;  Laterality: N/A;   BOTOX INJECTION N/A 09/11/2018   Procedure: Bladder BOTOX INJECTION;  Surgeon: Brandon, Ashley, MD;  Location: [facility];  Service: Urology;  Laterality: N/A;   CARDIAC CATHETERIZATION     CARDIAC CATHETERIZATION N/A 01/22/2015   Procedure: Left Heart Cath;  Surgeon: Bruce J Kowalski, MD;  Location: [facility];  Service: Cardiovascular;  Laterality: N/A;   CARDIAC CATHETERIZATION N/A 01/22/2015   Procedure: Coronary Stent Intervention;  Surgeon: Alexander Paraschos, MD;  Location: [facility];  Service: Cardiovascular;  Laterality: N/A;   CATARACT EXTRACTION, BILATERAL     COLONOSCOPY WITH PROPOFOL N/A 12/08/2015   Procedure: COLONOSCOPY WITH PROPOFOL;  Surgeon: Martin U Skulskie, MD;  Location: [facility];  Service: Endoscopy;  Laterality: N/A;   COLONOSCOPY WITH PROPOFOL N/A 12/09/2015   Procedure:   COLONOSCOPY WITH PROPOFOL;  Surgeon: Martin U Skulskie, MD;  Location: [facility];  Service: Endoscopy;  Laterality: N/A;   CORONARY ANGIOPLASTY     5 stents   CORONARY STENT PLACEMENT  2015   x5   CYSTOSCOPY N/A 09/11/2018   Procedure: CYSTOSCOPY;  Surgeon: Brandon, Ashley, MD;  Location: [facility];  Service: Urology;  Laterality: N/A;   CYSTOSCOPY WITH BIOPSY N/A 12/21/2017   Procedure: CYSTOSCOPY WITH BIOPSY;  Surgeon: Brandon, Ashley, MD;  Location: [facility];  Service: Urology;  Laterality: N/A;   ESOPHAGOGASTRODUODENOSCOPY (EGD) WITH PROPOFOL N/A 12/08/2015   Procedure: ESOPHAGOGASTRODUODENOSCOPY (EGD) WITH PROPOFOL;  Surgeon: Martin U Skulskie, MD;  Location: [facility];  Service: Endoscopy;  Laterality:  N/A;   ESOPHAGOGASTRODUODENOSCOPY (EGD) WITH PROPOFOL N/A 12/13/2017   Procedure: ESOPHAGOGASTRODUODENOSCOPY (EGD) WITH PROPOFOL;  Surgeon: Skulskie, Martin U, MD;  Location: [facility];  Service: Endoscopy;  Laterality: N/A;   EYE SURGERY     HERNIA REPAIR     kidney tumor remove     TRANSURETHRAL RESECTION OF BLADDER TUMOR WITH GYRUS (TURBT-GYRUS)  12/2013   UMBILICAL HERNIA REPAIR     urethral meatotomy     Patient Active Problem List   Diagnosis Date Noted   Class 2 obesity due to excess calories with body mass index (BMI) of 36.0 to 36.9 in adult 04/11/2020   Swelling of limb 03/28/2020   Lymphedema 03/28/2020   Trigeminal neuralgia 12/25/2019   Lower limb ulcer, calf, left, limited to breakdown of skin (HCC) 12/25/2019   OSA (obstructive sleep apnea) 01/23/2019   Acute systolic CHF (congestive heart failure) (HCC) 12/12/2018   Bruising 07/10/2018   Diet-controlled type 2 diabetes mellitus (HCC) 03/24/2018   Microalbuminuria 03/24/2018   Dizziness 11/17/2017   Simple chronic bronchitis (HCC) 09/22/2017   SOBOE (shortness of breath on exertion) 02/18/2017   Chronic midline low back pain without sciatica 12/29/2016   Periodic limb movement disorder 12/29/2016   Benign essential tremor 06/22/2016   Pure hypercholesterolemia 06/20/2015   Erectile dysfunction due to arterial insufficiency 06/16/2015   Unstable angina (HCC) 01/22/2015   Abdominal aortic 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 rupture (HCC) 12/31/2014   Benign prostatic hypertrophy without urinary obstruction 07/31/2014   Enlarged prostate 07/31/2014   Benign essential HTN 06/11/2014   Carotid artery narrowing 02/08/2014   Carotid artery obstruction 02/08/2014   Bilateral carotid artery stenosis 02/08/2014   Chest pain 08/20/2013   Bilateral cataracts 05/16/2013   Cataract 05/16/2013   Clinical depression 12/12/2012   Diabetes mellitus, type 2 (HCC) 12/12/2012   Combined fat and carbohydrate induced hyperlipemia  12/12/2012   Major depressive disorder, single episode, unspecified 12/12/2012   Acid reflux 11/09/2012   Presence of stent in coronary artery 11/09/2012   BP (high blood pressure) 11/09/2012   Healed myocardial infarct 11/09/2012   Gastro-esophageal reflux disease without esophagitis 11/09/2012   History of cardiovascular surgery 11/09/2012   Fothergill's neuralgia 08/07/2012   Swelling of testicle 04/06/2012   Disorder of male genital organ 04/06/2012   Swelling of the testicles 04/06/2012   Arteriosclerosis of coronary artery 11/16/2011   CAD in native artery 11/16/2011   Fatigue 06/04/2011   Avitaminosis D 11/30/2010    REFERRING DIAG: Repeated falls   THERAPY DIAG:   Difficulty in walking, not elsewhere classified  Muscle weakness (generalized)  Abnormality of gait and mobility  Other abnormalities of gait and mobility  Other lack of coordination  Unsteadiness on feet  Chronic bilateral low back pain without sciatica  Rationale for Evaluation and   Treatment Rehabilitation  PERTINENT HISTORY: Patient is a 75 year old male with referral for Physical Therapy with diagnosis of Parkinsons disease and history of falling. He reports he diagnosed last year with Parkinsons and has improved his tremors with use of medication but reports worsening shuffling and difficulty with mobility. Patient has past medical history signifiant for Parkinsons; Arthritis, bladder cancer, Trigemic Neuralgia. He lives with wife in 1 level home and current using cane with reported recent fall.   PRECAUTIONS: Falls  SUBJECTIVE: Pt reports he did have a near fall- falling into brick wall with small abrasion on left elbow. Denies any pain or other injuries.   PAIN:  No   TODAY'S TREATMENT:   Therex   Nustep- Interval LE strengthening L0 1 min L2 1 min L3 1 min L4 1min L2 1 min L0- 1 min  Seated LAQ 3lb AW- with 2 sec hold alt LE x 20 reps x 2 sets.   -Forward/retro gait in // bars  with 3lb AW down and back x 8.   -Forward lunge step with trunk twist x 12 reps each LE  -Stand- side step to left then reach diagonal overhead with Left UE then back to center- then to right with overhead diagonal Reach x 12 reps each.   STS 12x= No Support  Standing foot taps to 12" step x 20  alt LE step anteriorly Pt reported as "very challenging" and observed fatigued following intervention. Requires prolonged seated rest interval following intervention. No LOB  Standing lumbar flex into ext at // bars with arms outstretched x 12 reps.   Education provided throughout session via VC/TC and demonstration to facilitate movement at target joints and correct muscle activation for all testing and exercises performed.     PATIENT EDUCATION: Education details: Exercise technique, instruction in use of AD (see note for details) Person educated: Patient Education method: Explanation, Demonstration, Tactile cues, and Verbal cues Education comprehension: verbalized understanding, returned demonstration, verbal cues required, tactile cues required, and needs further education   HOME EXERCISE PROGRAM: No updates today   PT Short Term Goals -       PT SHORT TERM GOAL #1   Title Pt will be independent with INITIAL  HEP in order to improve strength and balance in order to decrease fall risk and improve function at home and work.    Baseline 04/07/2021= No formal HEP in place. 05/11/2021= Patient reports doing pretty good- compliant with walking program and his stretching/strengthening.    Time 6    Period Weeks    Status Achieved    Target Date 05/19/21      PT SHORT TERM GOAL #2   Title Pt will decrease 5TSTS by at least 3 seconds in order to demonstrate clinically significant improvement in LE strength.    Baseline 04/07/2021= 25 sec; 05/11/2021= 18.65 sec    Time 6    Period Weeks    Status Achieved    Target Date 06/30/21              PT Long Term Goals -       PT LONG TERM  GOAL #1   Title Pt will be independent with FINAL HEP in order to improve strength and balance in order to decrease fall risk and improve function at home and work.    Baseline 04/07/2021= No formal HEP in place. 07/01/2021=Patient reports performing walking and some standing LE strengthening exercises as instructed and states no questions at this time.    09/02/21: Pt reports he feels indep with exercises but is not performing HEP as much as he should   Time 12    Period Weeks    Status Partially Met    Target Date 09/23/21      PT LONG TERM GOAL #2   Title Pt will improve FOTO to target score of 45% to display perceived improvements in ability to complete ADL's.    Baseline 2/7/22023= 41%; FOTO=53%    Time 12    Period Weeks    Status Achieved    Target Date 06/30/21      PT LONG TERM GOAL #3   Title Pt will decrease 5TSTS by at least 5 seconds in order to demonstrate clinically significant improvement in LE strength.    Baseline 04/07/2021= 25 sec; 05/11/2021= 18.65 sec without UE support; 4/10= 13.76 sec without UE support    Time 12    Period Weeks    Status Achieved    Target Date 06/30/21      PT LONG TERM GOAL #4   Title Pt will decrease TUG to below 17 seconds/decrease in order to demonstrate decreased fall risk.    Baseline 04/07/2021= 21 sec with SPC; 05/11/2021= 18.85 sec with use of cane; 07/21/2021=18.88 sec  avg with use of cane; 7/5: 22.85 sec with QC   Time 12    Period Weeks    Status Partially Met    Target Date 09/23/21      PT LONG TERM GOAL #5   Title Pt will increase 10MWT by at least 0.15 m/s in order to demonstrate clinically significant improvement in community ambulation.    Baseline 04/07/2021= 0.56 m/s using SPC; 05/11/2021= 0.54 m/s using SPC; 07/01/2021= 0.6 m/s/ 7/5: 0.48 m/s without AD but with CGA   Time 12    Period Weeks    Status On-going    Target Date 09/23/21      PT LONG TERM GOAL #6   Title Pt will increase 6MWT by at least 33m(1648f in order to  demonstrate clinically significant improvement in cardiopulmonary endurance and community ambulation    Baseline 06/08/2021= 600 ft. in 5 min- Stopped due to fatigue using 4WW; 07/01/2021= 665 in 5 min 45 sec using 4WW; 7/5: 367 ft with QC   Time 4    Period Weeks    Status On-going    Target Date 09/23/21              Plan - 08/20/21 0939     Clinical Impression Statement Patient presented with excellent motivation today and performed well as seen by ability to improve with trunk twist with more ROM and no LOB Today. He was able to walk and step with improved step length and less overall VC for step height/length. The patient will benefit from skilled PT services to address deficits in gait, strength, endurance and balance to decrease risk for future falls.    Personal Factors and Comorbidities Comorbidity 3+    Comorbidities arthritis, bladder cancer, Trigeminal Neuralgia    Examination-Activity Limitations Caring for Others;Carry;Continence;Lift;Squat;Stairs;Stand    Examination-Participation Restrictions Community Activity;Yard Work    [employer] officervolving/Moderate complexity    Rehab Potential Good    PT Frequency 2x / week    PT Duration 12 weeks    PT Treatment/Interventions ADLs/Self Care Home Management;Cryotherapy;Canalith Repostioning;Electrical Stimulation;Moist Heat;DME Instruction;Gait training;Stair training;Functional mobility training;Therapeutic activities;Therapeutic exercise;Balance training;Neuromuscular re-education;Patient/family education;Manual techniques;Passive range of motion;Dry needling;Vestibular    PT Next Visit Plan LE strength,  improve step length with ambulation, muscle tissue lengthening    PT Home Exercise Plan Access Code: [email or web address]                N , PT 09/04/2021, 11:13 AM     

## 2021-09-08 ENCOUNTER — Ambulatory Visit: Payer: Medicare HMO

## 2021-09-08 DIAGNOSIS — R2681 Unsteadiness on feet: Secondary | ICD-10-CM

## 2021-09-08 DIAGNOSIS — R262 Difficulty in walking, not elsewhere classified: Secondary | ICD-10-CM

## 2021-09-08 DIAGNOSIS — M6281 Muscle weakness (generalized): Secondary | ICD-10-CM

## 2021-09-08 DIAGNOSIS — R2689 Other abnormalities of gait and mobility: Secondary | ICD-10-CM

## 2021-09-08 DIAGNOSIS — G8929 Other chronic pain: Secondary | ICD-10-CM

## 2021-09-08 DIAGNOSIS — R278 Other lack of coordination: Secondary | ICD-10-CM

## 2021-09-08 DIAGNOSIS — R269 Unspecified abnormalities of gait and mobility: Secondary | ICD-10-CM

## 2021-09-08 NOTE — Therapy (Signed)
OUTPATIENT PHYSICAL THERAPY TREATMENT NOTE    Patient Name: [name] MRN: [id number] DOB:[birth year], 76 y.o., male Today's Date: 09/08/2021  PCP: [name]. [name], Terry PROVIDER: Dr. Gurney Maxin   PT End of Session - 09/08/21 0941     Visit Number 42    Number of Visits 50    Date for PT Re-Evaluation 09/23/21    Authorization Time Period Initial Certification= 10/03/2776- 06/30/2021; PN on 2/42/3536; Recert 03/04/4313-4/00/8676    Progress Note Due on Visit 67    PT Start Time 0935    PT Stop Time 1950    PT Time Calculation (min) 39 min    Equipment Utilized During Treatment Gait belt    Activity Tolerance Patient tolerated treatment well    Behavior During Therapy [facility] for tasks assessed/performed               Past Medical History:  Diagnosis Date   Anxiety    Arteriosclerosis of coronary artery 11/16/2011   Overview:  Stent 10/2011 stent rca 2015 with collaterals to lad which is chronically occluded    Benign enlargement of prostate    Benign essential HTN 06/11/2014   Benign prostatic hypertrophy without urinary obstruction 07/31/2014   Bilateral cataracts 05/16/2013   Overview:  Dr. [facility]     Bone spur of foot    Left   BP (high blood pressure) 11/09/2012   Cancer ([place])    skin (forehead) and bladder   Carotid artery narrowing 02/08/2014   Depression    Detrusor hypertrophy    Diabetes ([place])    Diabetes mellitus, type 2 ([place]) 12/12/2012   Diverticulosis    Dyspnea    Esophageal reflux    Esophageal reflux    Fothergill's neuralgia 08/07/2012   Overview:  [facility]    Gastritis    GERD (gastroesophageal reflux disease)    Headache    cluster headaches   Healed myocardial infarct 11/09/2012   Hearing loss in left ear    Heart disease    Hematuria    Hemorrhoids    History of hiatal hernia 12/14/2017   small    Hypercholesteremia    Lesion of bladder    Myocardial infarct ([place])    Presence of stent in coronary artery  11/09/2012   Rectal bleeding    Trigeminal neuralgia    Trigeminal neuralgia    Valvular heart disease    Vitamin D deficiency    Past Surgical History:  Procedure Laterality Date   APPENDECTOMY     BOTOX INJECTION N/A 12/21/2017   Procedure: Bladder BOTOX INJECTION;  Surgeon: [name], MD;  Location: [facility];  Service: Urology;  Laterality: N/A;   BOTOX INJECTION N/A 09/11/2018   Procedure: Bladder BOTOX INJECTION;  Surgeon: [name], MD;  Location: [facility];  Service: Urology;  Laterality: N/A;   CARDIAC CATHETERIZATION     CARDIAC CATHETERIZATION N/A 01/22/2015   Procedure: Left Heart Cath;  Surgeon: Corey Skains, MD;  Location: [place] [facility];  Service: Cardiovascular;  Laterality: N/A;   CARDIAC CATHETERIZATION N/A 01/22/2015   Procedure: Coronary Stent Intervention;  Surgeon: Isaias Cowman, MD;  Location: [place] [facility];  Service: Cardiovascular;  Laterality: N/A;   CATARACT EXTRACTION, BILATERAL     COLONOSCOPY WITH PROPOFOL N/A 12/08/2015   Procedure: COLONOSCOPY WITH PROPOFOL;  Surgeon: Lollie Sails, MD;  Location: [facility];  Service: Endoscopy;  Laterality: N/A;   COLONOSCOPY WITH PROPOFOL N/A 12/09/2015   Procedure:  COLONOSCOPY WITH PROPOFOL;  Surgeon: Martin U Skulskie, MD;  Location: [facility] ENDOSCOPY;  Service: Endoscopy;  Laterality: N/A;   CORONARY ANGIOPLASTY     5 stents   CORONARY STENT PLACEMENT  2015   x5   CYSTOSCOPY N/A 09/11/2018   Procedure: CYSTOSCOPY;  Surgeon: Brandon, Ashley, MD;  Location: [facility];  Service: Urology;  Laterality: N/A;   CYSTOSCOPY WITH BIOPSY N/A 12/21/2017   Procedure: CYSTOSCOPY WITH BIOPSY;  Surgeon: Brandon, Ashley, MD;  Location: [facility];  Service: Urology;  Laterality: N/A;   ESOPHAGOGASTRODUODENOSCOPY (EGD) WITH PROPOFOL N/A 12/08/2015   Procedure: ESOPHAGOGASTRODUODENOSCOPY (EGD) WITH PROPOFOL;  Surgeon: Martin U Skulskie, MD;  Location: [facility] ENDOSCOPY;  Service: Endoscopy;  Laterality:  N/A;   ESOPHAGOGASTRODUODENOSCOPY (EGD) WITH PROPOFOL N/A 12/13/2017   Procedure: ESOPHAGOGASTRODUODENOSCOPY (EGD) WITH PROPOFOL;  Surgeon: Skulskie, Martin U, MD;  Location: [facility] ENDOSCOPY;  Service: Endoscopy;  Laterality: N/A;   EYE SURGERY     HERNIA REPAIR     kidney tumor remove     TRANSURETHRAL RESECTION OF BLADDER TUMOR WITH GYRUS (TURBT-GYRUS)  12/2013   UMBILICAL HERNIA REPAIR     urethral meatotomy     Patient Active Problem List   Diagnosis Date Noted   Class 2 obesity due to excess calories with body mass index (BMI) of 36.0 to 36.9 in adult 04/11/2020   Swelling of limb 03/28/2020   Lymphedema 03/28/2020   Trigeminal neuralgia 12/25/2019   Lower limb ulcer, calf, left, limited to breakdown of skin (HCC) 12/25/2019   OSA (obstructive sleep apnea) 01/23/2019   Acute systolic CHF (congestive heart failure) (HCC) 12/12/2018   Bruising 07/10/2018   Diet-controlled type 2 diabetes mellitus (HCC) 03/24/2018   Microalbuminuria 03/24/2018   Dizziness 11/17/2017   Simple chronic bronchitis (HCC) 09/22/2017   SOBOE (shortness of breath on exertion) 02/18/2017   Chronic midline low back pain without sciatica 12/29/2016   Periodic limb movement disorder 12/29/2016   Benign essential tremor 06/22/2016   Pure hypercholesterolemia 06/20/2015   Erectile dysfunction due to arterial insufficiency 06/16/2015   Unstable angina (HCC) 01/22/2015   Abdominal aortic 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 rupture (HCC) 12/31/2014   Benign prostatic hypertrophy without urinary obstruction 07/31/2014   Enlarged prostate 07/31/2014   Benign essential HTN 06/11/2014   Carotid artery narrowing 02/08/2014   Carotid artery obstruction 02/08/2014   Bilateral carotid artery stenosis 02/08/2014   Chest pain 08/20/2013   Bilateral cataracts 05/16/2013   Cataract 05/16/2013   Clinical depression 12/12/2012   Diabetes mellitus, type 2 (HCC) 12/12/2012   Combined fat and carbohydrate induced hyperlipemia  12/12/2012   Major depressive disorder, single episode, unspecified 12/12/2012   Acid reflux 11/09/2012   Presence of stent in coronary artery 11/09/2012   BP (high blood pressure) 11/09/2012   Healed myocardial infarct 11/09/2012   Gastro-esophageal reflux disease without esophagitis 11/09/2012   History of cardiovascular surgery 11/09/2012   Fothergill's neuralgia 08/07/2012   Swelling of testicle 04/06/2012   Disorder of male genital organ 04/06/2012   Swelling of the testicles 04/06/2012   Arteriosclerosis of coronary artery 11/16/2011   CAD in native artery 11/16/2011   Fatigue 06/04/2011   Avitaminosis D 11/30/2010    REFERRING DIAG: Repeated falls   THERAPY DIAG:   Difficulty in walking, not elsewhere classified  Muscle weakness (generalized)  Abnormality of gait and mobility  Other abnormalities of gait and mobility  Other lack of coordination  Unsteadiness on feet  Chronic bilateral low back pain without sciatica  Rationale for Evaluation and   Treatment Rehabilitation  PERTINENT HISTORY: Patient is a 76 year old male with referral for Physical Therapy with diagnosis of Parkinsons disease and history of falling. He reports he diagnosed last year with Parkinsons and has improved his tremors with use of medication but reports worsening shuffling and difficulty with mobility. Patient has past medical history signifiant for Parkinsons; Arthritis, bladder cancer, Trigemic Neuralgia. He lives with wife in 1 level home and current using cane with reported recent fall.   PRECAUTIONS: Falls  SUBJECTIVE: Pt reports he had a good weekend and no falls and no pain today. Reports using his walker if he goes out but using cane in home.   PAIN:  No   TODAY'S TREATMENT:   Therex   Nustep- Interval LE strengthening- LE only L0 1 min L2 2 min L3 2 min L1 44min L3 1 min L1- 1 min  Sit to stand from elevated to lower surface -24 in x 4 -22 in x 4  -20 in x 4 Approx 19  in x 4 - all without UE support - Patient reports the last 4 were tough.   Seated LAQ 3lb AW- with 2 sec hold alt LE x 20 reps x 2 sets.   -Forward/retro gait in // bars with 3lb AW down and back x 10  -trunk twist with PT behind patient and ball toss to left and right LE x 20 reps each   -Stand- side step to left then reach diagonal overhead with Left UE then back to center- then to right with overhead diagonal Reach x 12 reps each.     Education provided throughout session via VC/TC and demonstration to facilitate movement at target joints and correct muscle activation for all testing and exercises performed.     PATIENT EDUCATION: Education details: Exercise technique, instruction in use of AD (see note for details) Person educated: Patient Education method: Explanation, Demonstration, Tactile cues, and Verbal cues Education comprehension: verbalized understanding, returned demonstration, verbal cues required, tactile cues required, and needs further education   HOME EXERCISE PROGRAM: No updates today   PT Short Term Goals -       PT SHORT TERM GOAL #1   Title Pt will be independent with INITIAL  HEP in order to improve strength and balance in order to decrease fall risk and improve function at home and work.    Baseline 04/07/2021= No formal HEP in place. 05/11/2021= Patient reports doing pretty good- compliant with walking program and his stretching/strengthening.    Time 6    Period Weeks    Status Achieved    Target Date 05/19/21      PT SHORT TERM GOAL #2   Title Pt will decrease 5TSTS by at least 3 seconds in order to demonstrate clinically significant improvement in LE strength.    Baseline 04/07/2021= 25 sec; 05/11/2021= 18.65 sec    Time 6    Period Weeks    Status Achieved    Target Date 06/30/21              PT Long Term Goals -       PT LONG TERM GOAL #1   Title Pt will be independent with FINAL HEP in order to improve strength and balance in order to  decrease fall risk and improve function at home and work.    Baseline 04/07/2021= No formal HEP in place. 07/01/2021=Patient reports performing walking and some standing LE strengthening exercises as instructed and states no questions at this time.  09/02/21: Pt reports he feels indep with exercises but is not performing HEP as much as he should   Time 12    Period Weeks    Status Partially Met    Target Date 09/23/21      PT LONG TERM GOAL #2   Title Pt will improve FOTO to target score of 45% to display perceived improvements in ability to complete ADL's.    Baseline 2/7/22023= 41%; FOTO=53%    Time 12    Period Weeks    Status Achieved    Target Date 06/30/21      PT LONG TERM GOAL #3   Title Pt will decrease 5TSTS by at least 5 seconds in order to demonstrate clinically significant improvement in LE strength.    Baseline 04/07/2021= 25 sec; 05/11/2021= 18.65 sec without UE support; 4/10= 13.76 sec without UE support    Time 12    Period Weeks    Status Achieved    Target Date 06/30/21      PT LONG TERM GOAL #4   Title Pt will decrease TUG to below 17 seconds/decrease in order to demonstrate decreased fall risk.    Baseline 04/07/2021= 21 sec with SPC; 05/11/2021= 18.85 sec with use of cane; 07/21/2021=18.88 sec  avg with use of cane; 7/5: 22.85 sec with QC   Time 12    Period Weeks    Status Partially Met    Target Date 09/23/21      PT LONG TERM GOAL #5   Title Pt will increase 10MWT by at least 0.15 m/s in order to demonstrate clinically significant improvement in community ambulation.    Baseline 04/07/2021= 0.56 m/s using SPC; 05/11/2021= 0.54 m/s using SPC; 07/01/2021= 0.6 m/s/ 7/5: 0.48 m/s without AD but with CGA   Time 12    Period Weeks    Status On-going    Target Date 09/23/21      PT LONG TERM GOAL #6   Title Pt will increase 6MWT by at least 55m (184ft) in order to demonstrate clinically significant improvement in cardiopulmonary endurance and community ambulation    Baseline  06/08/2021= 600 ft. in 5 min- Stopped due to fatigue using 4WW; 07/01/2021= 665 in 5 min 45 sec using 4WW; 7/5: 367 ft with QC   Time 4    Period Weeks    Status On-going    Target Date 09/23/21              Plan - 08/20/21 0939     Clinical Impression Statement Patient presented with improved coordination with tasks including improved step and reach and ability to twist trunk today vs. Previous session. He was fatigued with sit to stand but overall performed well with increased reps. The patient will benefit from skilled PT services to address deficits in gait, strength, endurance and balance to decrease risk for future falls.    Personal Factors and Comorbidities Comorbidity 3+    Comorbidities arthritis, bladder cancer, Trigeminal Neuralgia    Examination-Activity Limitations Caring for Others;Carry;Continence;Lift;Squat;Stairs;Stand    Examination-Participation Restrictions Community Activity;Yard Work    [employer] Evolving/Moderate complexity    Rehab Potential Good    PT Frequency 2x / week    PT Duration 12 weeks    PT Treatment/Interventions ADLs/Self Care Home Management;Cryotherapy;Canalith Repostioning;Electrical Stimulation;Moist Heat;DME Instruction;Gait training;Stair training;Functional mobility training;Therapeutic activities;Therapeutic exercise;Balance training;Neuromuscular re-education;Patient/family education;Manual techniques;Passive range of motion;Dry needling;Vestibular    PT Next Visit Plan LE strength, improve step length with ambulation, muscle tissue lengthening  PT Home Exercise Plan Access Code: [email or web address]               Lewis Moccasin, PT 09/08/2021, 11:49 AM

## 2021-09-09 ENCOUNTER — Other Ambulatory Visit: Payer: Self-pay | Admitting: Urology

## 2021-09-09 NOTE — Progress Notes (Incomplete)
.    09/09/21 CC: No chief complaint on file.     HPI: [name] is a 76 y.o. male with a personal history of bladder cancer and OAB  who presents today for an annual surviellance cystoscopy.   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   He has failed Botox, multiple oral agents as well as stage I InterStim. He had some improvement on PTNS. He is followed by Dr Matilde Sprang for this. He was last seen by Dr Matilde Sprang on 07/20/2021 he was noted to have reached the end of the treatment.   There were no vitals filed for this visit. NED. A&Ox3.   No respiratory distress   Abd soft, NT, ND Normal phallus with bilateral descended testicles  Cystoscopy Procedure Note  Patient identification was confirmed, informed consent was obtained, and patient was prepped using Betadine solution.  Lidocaine jelly was administered per urethral meatus.     Pre-Procedure: - Inspection reveals a normal caliber ureteral meatus.  Procedure: The flexible cystoscope was introduced without difficulty - No urethral strictures/lesions are present. - {Blank multiple:19197::"Enlarged","Surgically absent","Normal"} prostate *** - {Blank multiple:19197::"Normal","Elevated","Tight"} bladder neck - Bilateral ureteral orifices identified - Bladder mucosa  reveals no ulcers, tumors, or lesions - No bladder stones - No trabeculation  Retroflexion shows ***   Post-Procedure: - Patient tolerated the procedure well   Assessment/ Plan:   No follow-ups on file.  I,Kailey Littlejohn,acting as a Education administrator for [name], MD.,have documented all relevant documentation on the behalf of [name], MD,as directed by  [name], MD while in the presence of [name], MD.

## 2021-09-10 ENCOUNTER — Ambulatory Visit: Payer: Medicare HMO

## 2021-09-10 DIAGNOSIS — G8929 Other chronic pain: Secondary | ICD-10-CM

## 2021-09-10 DIAGNOSIS — R278 Other lack of coordination: Secondary | ICD-10-CM

## 2021-09-10 DIAGNOSIS — R262 Difficulty in walking, not elsewhere classified: Secondary | ICD-10-CM

## 2021-09-10 DIAGNOSIS — R2681 Unsteadiness on feet: Secondary | ICD-10-CM

## 2021-09-10 DIAGNOSIS — M6281 Muscle weakness (generalized): Secondary | ICD-10-CM

## 2021-09-10 DIAGNOSIS — R269 Unspecified abnormalities of gait and mobility: Secondary | ICD-10-CM

## 2021-09-10 DIAGNOSIS — R2689 Other abnormalities of gait and mobility: Secondary | ICD-10-CM

## 2021-09-10 NOTE — Therapy (Signed)
OUTPATIENT PHYSICAL THERAPY TREATMENT NOTE    Patient Name: [name] MRN: [id number] DOB:1945-05-11, 76 y.o., male Today's Date: 09/10/2021  PCP: [name]. [name], [place] PROVIDER: Dr. Gurney Maxin   PT End of Session - 09/10/21 0939     Visit Number 43    Number of Visits 50    Date for PT Re-Evaluation 09/23/21    Authorization Time Period Initial Certification= 08/07/6293- 06/30/2021; PN on 2/84/1324; Recert 4/0/1027-2/53/6644    Progress Note Due on Visit 39    PT Start Time 0934    PT Stop Time 1014    PT Time Calculation (min) 40 min    Equipment Utilized During Treatment Gait belt    Activity Tolerance Patient tolerated treatment well    Behavior During Therapy [facility] for tasks assessed/performed               Past Medical History:  Diagnosis Date   Anxiety    Arteriosclerosis of coronary artery 11/16/2011   Overview:  Stent 10/2011 stent rca 2015 with collaterals to lad which is chronically occluded    Benign enlargement of prostate    Benign essential HTN 06/11/2014   Benign prostatic hypertrophy without urinary obstruction 07/31/2014   Bilateral cataracts 05/16/2013   Overview:  Dr. [facility]     Bone spur of foot    Left   BP (high blood pressure) 11/09/2012   Cancer ([place])    skin (forehead) and bladder   Carotid artery narrowing 02/08/2014   Depression    Detrusor hypertrophy    Diabetes ([place])    Diabetes mellitus, type 2 ([place]) 12/12/2012   Diverticulosis    Dyspnea    Esophageal reflux    Esophageal reflux    Fothergill's neuralgia 08/07/2012   Overview:  [facility]    Gastritis    GERD (gastroesophageal reflux disease)    Headache    cluster headaches   Healed myocardial infarct 11/09/2012   Hearing loss in left ear    Heart disease    Hematuria    Hemorrhoids    History of hiatal hernia 12/14/2017   small    Hypercholesteremia    Lesion of bladder    Myocardial infarct (HCC)    Presence of stent in coronary artery  11/09/2012   Rectal bleeding    Trigeminal neuralgia    Trigeminal neuralgia    Valvular heart disease    Vitamin D deficiency    Past Surgical History:  Procedure Laterality Date   APPENDECTOMY     BOTOX INJECTION N/A 12/21/2017   Procedure: Bladder BOTOX INJECTION;  Surgeon: [name], MD;  Location: [facility];  Service: Urology;  Laterality: N/A;   BOTOX INJECTION N/A 09/11/2018   Procedure: Bladder BOTOX INJECTION;  Surgeon: [name], MD;  Location: [facility];  Service: Urology;  Laterality: N/A;   CARDIAC CATHETERIZATION     CARDIAC CATHETERIZATION N/A 01/22/2015   Procedure: Left Heart Cath;  Surgeon: Corey Skains, MD;  Location: [place] [facility];  Service: Cardiovascular;  Laterality: N/A;   CARDIAC CATHETERIZATION N/A 01/22/2015   Procedure: Coronary Stent Intervention;  Surgeon: Isaias Cowman, MD;  Location: [place] [facility];  Service: Cardiovascular;  Laterality: N/A;   CATARACT EXTRACTION, BILATERAL     COLONOSCOPY WITH PROPOFOL N/A 12/08/2015   Procedure: COLONOSCOPY WITH PROPOFOL;  Surgeon: Lollie Sails, MD;  Location: [facility];  Service: Endoscopy;  Laterality: N/A;   COLONOSCOPY WITH PROPOFOL N/A 12/09/2015   Procedure:  COLONOSCOPY WITH PROPOFOL;  Surgeon: Martin U Skulskie, MD;  Location: [facility];  Service: Endoscopy;  Laterality: N/A;   CORONARY ANGIOPLASTY     5 stents   CORONARY STENT PLACEMENT  2015   x5   CYSTOSCOPY N/A 09/11/2018   Procedure: CYSTOSCOPY;  Surgeon: Brandon, Ashley, MD;  Location: [facility];  Service: Urology;  Laterality: N/A;   CYSTOSCOPY WITH BIOPSY N/A 12/21/2017   Procedure: CYSTOSCOPY WITH BIOPSY;  Surgeon: Brandon, Ashley, MD;  Location: [facility];  Service: Urology;  Laterality: N/A;   ESOPHAGOGASTRODUODENOSCOPY (EGD) WITH PROPOFOL N/A 12/08/2015   Procedure: ESOPHAGOGASTRODUODENOSCOPY (EGD) WITH PROPOFOL;  Surgeon: Martin U Skulskie, MD;  Location: [facility];  Service: Endoscopy;  Laterality:  N/A;   ESOPHAGOGASTRODUODENOSCOPY (EGD) WITH PROPOFOL N/A 12/13/2017   Procedure: ESOPHAGOGASTRODUODENOSCOPY (EGD) WITH PROPOFOL;  Surgeon: Skulskie, Martin U, MD;  Location: [facility];  Service: Endoscopy;  Laterality: N/A;   EYE SURGERY     HERNIA REPAIR     kidney tumor remove     TRANSURETHRAL RESECTION OF BLADDER TUMOR WITH GYRUS (TURBT-GYRUS)  12/2013   UMBILICAL HERNIA REPAIR     urethral meatotomy     Patient Active Problem List   Diagnosis Date Noted   Class 2 obesity due to excess calories with body mass index (BMI) of 36.0 to 36.9 in adult 04/11/2020   Swelling of limb 03/28/2020   Lymphedema 03/28/2020   Trigeminal neuralgia 12/25/2019   Lower limb ulcer, calf, left, limited to breakdown of skin (HCC) 12/25/2019   OSA (obstructive sleep apnea) 01/23/2019   Acute systolic CHF (congestive heart failure) (HCC) 12/12/2018   Bruising 07/10/2018   Diet-controlled type 2 diabetes mellitus (HCC) 03/24/2018   Microalbuminuria 03/24/2018   Dizziness 11/17/2017   Simple chronic bronchitis (HCC) 09/22/2017   SOBOE (shortness of breath on exertion) 02/18/2017   Chronic midline low back pain without sciatica 12/29/2016   Periodic limb movement disorder 12/29/2016   Benign essential tremor 06/22/2016   Pure hypercholesterolemia 06/20/2015   Erectile dysfunction due to arterial insufficiency 06/16/2015   Unstable angina (HCC) 01/22/2015   Abdominal aortic 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 rupture (HCC) 12/31/2014   Benign prostatic hypertrophy without urinary obstruction 07/31/2014   Enlarged prostate 07/31/2014   Benign essential HTN 06/11/2014   Carotid artery narrowing 02/08/2014   Carotid artery obstruction 02/08/2014   Bilateral carotid artery stenosis 02/08/2014   Chest pain 08/20/2013   Bilateral cataracts 05/16/2013   Cataract 05/16/2013   Clinical depression 12/12/2012   Diabetes mellitus, type 2 (HCC) 12/12/2012   Combined fat and carbohydrate induced hyperlipemia  12/12/2012   Major depressive disorder, single episode, unspecified 12/12/2012   Acid reflux 11/09/2012   Presence of stent in coronary artery 11/09/2012   BP (high blood pressure) 11/09/2012   Healed myocardial infarct 11/09/2012   Gastro-esophageal reflux disease without esophagitis 11/09/2012   History of cardiovascular surgery 11/09/2012   Fothergill's neuralgia 08/07/2012   Swelling of testicle 04/06/2012   Disorder of male genital organ 04/06/2012   Swelling of the testicles 04/06/2012   Arteriosclerosis of coronary artery 11/16/2011   CAD in native artery 11/16/2011   Fatigue 06/04/2011   Avitaminosis D 11/30/2010    REFERRING DIAG: Repeated falls   THERAPY DIAG:   Difficulty in walking, not elsewhere classified  Muscle weakness (generalized)  Abnormality of gait and mobility  Other abnormalities of gait and mobility  Other lack of coordination  Unsteadiness on feet  Chronic bilateral low back pain without sciatica  Rationale for Evaluation and   Treatment Rehabilitation  PERTINENT HISTORY: Patient is a 76 year old male with referral for Physical Therapy with diagnosis of Parkinsons disease and history of falling. He reports he diagnosed last year with Parkinsons and has improved his tremors with use of medication but reports worsening shuffling and difficulty with mobility. Patient has past medical history signifiant for Parkinsons; Arthritis, bladder cancer, Trigemic Neuralgia. He lives with wife in 1 level home and current using cane with reported recent fall.   PRECAUTIONS: Falls  SUBJECTIVE: Patient reports doing well overall and states no pain and no falls.   PAIN:  None   TODAY'S TREATMENT:   Therex   Step tap onto 6" block  with 3lb AW BLE x 12 reps   Stand step ups on 6" block x 12 reps each LE 3lb AW  Sit to stand from elevated to lower surface -24 in x 6 -22 in x 6 -20 in x 6 -19 in x 6 - all without UE support - Patient reports the last 4  were tough.   Seated LAQ 3lb AW- with 2 sec hold alt LE x 20 reps x 2 sets.   -Forward/retro gait in // bars with 3lb AW down and back x 10  -Squat in // bar to pick up cone off 6" block then turn 90 deg and side step 2-3 steps and place cone overhead onto top of PT's computer desk x 8 cones then return back to block x 8.  -Stand- side step to left then reach diagonal overhead (to sticky notes on mirror) with Left UE then back to center- then to right with overhead diagonal Reach x 12 reps each.     Education provided throughout session via VC/TC and demonstration to facilitate movement at target joints and correct muscle activation for all testing and exercises performed.     PATIENT EDUCATION: Education details: Exercise technique, instruction in use of AD (see note for details) Person educated: Patient Education method: Explanation, Demonstration, Tactile cues, and Verbal cues Education comprehension: verbalized understanding, returned demonstration, verbal cues required, tactile cues required, and needs further education   HOME EXERCISE PROGRAM: No updates today   PT Short Term Goals -       PT SHORT TERM GOAL #1   Title Pt will be independent with INITIAL  HEP in order to improve strength and balance in order to decrease fall risk and improve function at home and work.    Baseline 04/07/2021= No formal HEP in place. 05/11/2021= Patient reports doing pretty good- compliant with walking program and his stretching/strengthening.    Time 6    Period Weeks    Status Achieved    Target Date 05/19/21      PT SHORT TERM GOAL #2   Title Pt will decrease 5TSTS by at least 3 seconds in order to demonstrate clinically significant improvement in LE strength.    Baseline 04/07/2021= 25 sec; 05/11/2021= 18.65 sec    Time 6    Period Weeks    Status Achieved    Target Date 06/30/21              PT Long Term Goals -       PT LONG TERM GOAL #1   Title Pt will be independent with  FINAL HEP in order to improve strength and balance in order to decrease fall risk and improve function at home and work.    Baseline 04/07/2021= No formal HEP in place. 07/01/2021=Patient reports performing walking and  some standing LE strengthening exercises as instructed and states no questions at this time.  09/02/21: Pt reports he feels indep with exercises but is not performing HEP as much as he should   Time 12    Period Weeks    Status Partially Met    Target Date 09/23/21      PT LONG TERM GOAL #2   Title Pt will improve FOTO to target score of 45% to display perceived improvements in ability to complete ADL's.    Baseline 2/7/22023= 41%; FOTO=53%    Time 12    Period Weeks    Status Achieved    Target Date 06/30/21      PT LONG TERM GOAL #3   Title Pt will decrease 5TSTS by at least 5 seconds in order to demonstrate clinically significant improvement in LE strength.    Baseline 04/07/2021= 25 sec; 05/11/2021= 18.65 sec without UE support; 4/10= 13.76 sec without UE support    Time 12    Period Weeks    Status Achieved    Target Date 06/30/21      PT LONG TERM GOAL #4   Title Pt will decrease TUG to below 17 seconds/decrease in order to demonstrate decreased fall risk.    Baseline 04/07/2021= 21 sec with SPC; 05/11/2021= 18.85 sec with use of cane; 07/21/2021=18.88 sec  avg with use of cane; 7/5: 22.85 sec with QC   Time 12    Period Weeks    Status Partially Met    Target Date 09/23/21      PT LONG TERM GOAL #5   Title Pt will increase 10MWT by at least 0.15 m/s in order to demonstrate clinically significant improvement in community ambulation.    Baseline 04/07/2021= 0.56 m/s using SPC; 05/11/2021= 0.54 m/s using SPC; 07/01/2021= 0.6 m/s/ 7/5: 0.48 m/s without AD but with CGA   Time 12    Period Weeks    Status On-going    Target Date 09/23/21      PT LONG TERM GOAL #6   Title Pt will increase 6MWT by at least 38m (127ft) in order to demonstrate clinically significant improvement in  cardiopulmonary endurance and community ambulation    Baseline 06/08/2021= 600 ft. in 5 min- Stopped due to fatigue using 4WW; 07/01/2021= 665 in 5 min 45 sec using 4WW; 7/5: 367 ft with QC   Time 4    Period Weeks    Status On-going    Target Date 09/23/21              Plan - 08/20/21 0939     Clinical Impression Statement Patient presented with good motivation for today's session. He again presented with improving step length with forward/retro gait including use of ankle weight for resistance. He was able to demo improved flexibility in trunk with turning today. He also presented with good coordination with step and reach with no shuffling and able to overhead reach better overall today.  The patient will benefit from skilled PT services to address deficits in gait, strength, endurance and balance to decrease risk for future falls.    Personal Factors and Comorbidities Comorbidity 3+    Comorbidities arthritis, bladder cancer, Trigeminal Neuralgia    Examination-Activity Limitations Caring for Others;Carry;Continence;Lift;Squat;Stairs;Stand    Examination-Participation Restrictions Community Activity;Yard Work    Stability/Clinical Decision Making Evolving/Moderate complexity    Rehab Potential Good    PT Frequency 2x / week    PT Duration 12 weeks    PT Treatment/Interventions  ADLs/Self Care Home Management;Cryotherapy;Canalith Repostioning;Electrical Stimulation;Moist Heat;DME Instruction;Gait training;Stair training;Functional mobility training;Therapeutic activities;Therapeutic exercise;Balance training;Neuromuscular re-education;Patient/family education;Manual techniques;Passive range of motion;Dry needling;Vestibular    PT Next Visit Plan LE strength, improve step length with ambulation, muscle tissue lengthening    PT Home Exercise Plan Access Code: [email or web address]               Lewis Moccasin, PT 09/10/2021, 4:44 PM

## 2021-09-15 ENCOUNTER — Ambulatory Visit: Payer: Medicare HMO

## 2021-09-15 DIAGNOSIS — R2681 Unsteadiness on feet: Secondary | ICD-10-CM

## 2021-09-15 DIAGNOSIS — M6281 Muscle weakness (generalized): Secondary | ICD-10-CM

## 2021-09-15 DIAGNOSIS — R2689 Other abnormalities of gait and mobility: Secondary | ICD-10-CM

## 2021-09-15 DIAGNOSIS — R262 Difficulty in walking, not elsewhere classified: Secondary | ICD-10-CM | POA: Diagnosis not present

## 2021-09-15 DIAGNOSIS — R269 Unspecified abnormalities of gait and mobility: Secondary | ICD-10-CM

## 2021-09-15 DIAGNOSIS — R278 Other lack of coordination: Secondary | ICD-10-CM

## 2021-09-15 DIAGNOSIS — M545 Low back pain, unspecified: Secondary | ICD-10-CM

## 2021-09-15 NOTE — Therapy (Addendum)
OUTPATIENT PHYSICAL THERAPY TREATMENT NOTE    Patient Name: [name] MRN: [id number] DOB:May 22, 1945, 76 y.o., male Today's Date: 09/15/2021  PCP: [name]. [name], [place] PROVIDER: Dr. Gurney Maxin   PT End of Session - 09/15/21 1045     Visit Number 44    Number of Visits 50    Date for PT Re-Evaluation 09/23/21    Authorization Time Period Initial Certification= 08/08/5070- 06/30/2021; PN on 2/57/5051; Recert 10/01/3580-07/16/9840    Progress Note Due on Visit 26    PT Start Time 0931    PT Stop Time 1015    PT Time Calculation (min) 44 min    Equipment Utilized During Treatment Gait belt    Activity Tolerance Patient tolerated treatment well    Behavior During Therapy [facility] for tasks assessed/performed                Past Medical History:  Diagnosis Date   Anxiety    Arteriosclerosis of coronary artery 11/16/2011   Overview:  Stent 10/2011 stent rca 2015 with collaterals to lad which is chronically occluded    Benign enlargement of prostate    Benign essential HTN 06/11/2014   Benign prostatic hypertrophy without urinary obstruction 07/31/2014   Bilateral cataracts 05/16/2013   Overview:  Dr. [facility]     Bone spur of foot    Left   BP (high blood pressure) 11/09/2012   Cancer ([place])    skin (forehead) and bladder   Carotid artery narrowing 02/08/2014   Depression    Detrusor hypertrophy    Diabetes ([place])    Diabetes mellitus, type 2 ([place]) 12/12/2012   Diverticulosis    Dyspnea    Esophageal reflux    Esophageal reflux    Fothergill's neuralgia 08/07/2012   Overview:  [facility]    Gastritis    GERD (gastroesophageal reflux disease)    Headache    cluster headaches   Healed myocardial infarct 11/09/2012   Hearing loss in left ear    Heart disease    Hematuria    Hemorrhoids    History of hiatal hernia 12/14/2017   small    Hypercholesteremia    Lesion of bladder    Myocardial infarct (HCC)    Presence of stent in coronary artery  11/09/2012   Rectal bleeding    Trigeminal neuralgia    Trigeminal neuralgia    Valvular heart disease    Vitamin D deficiency    Past Surgical History:  Procedure Laterality Date   APPENDECTOMY     BOTOX INJECTION N/A 12/21/2017   Procedure: Bladder BOTOX INJECTION;  Surgeon: [name], MD;  Location: [facility] ORS;  Service: Urology;  Laterality: N/A;   BOTOX INJECTION N/A 09/11/2018   Procedure: Bladder BOTOX INJECTION;  Surgeon: [name], MD;  Location: [facility] OR[facility];  Service: Urology;  Laterality: N/A;   CARDIAC CATHETERIZATION     CARDIAC CATHETERIZATION N/A 01/22/2015   Procedure: Left Heart Cath;  Surgeon: Corey Skains, MD;  Location: [place] CV LAB;  Service: Cardiovascular;  Laterality: N/A;   CARDIAC CATHETERIZATION N/A 01/22/2015   Procedure: Coronary Stent Intervention;  Surgeon: Isaias Cowman, MD;  Location: [place] [facility];  Service: Cardiovascular;  Laterality: N/A;   CATARACT EXTRACTION, BILATERAL     COLONOSCOPY WITH PROPOFOL N/A 12/08/2015   Procedure: COLONOSCOPY WITH PROPOFOL;  Surgeon: Lollie Sails, MD;  Location[facility] ENDOSCOPY;  Service: Endoscopy;  Laterality: N/A;   COLONOSCOPY WITH PROPOFOL N/A 12/09/2015  Procedure: COLONOSCOPY WITH PROPOFOL;  Surgeon: Lollie Sails, MD;  Location: [facility] ENDOSCOPY;  Service: Endoscopy;  Laterality: N/A;   CORONARY ANGIOPLASTY     5 stents   CORONARY STENT PLACEMENT  2015   x5   CYSTOSCOPY N/A 09/11/2018   Procedure: CYSTOSCOPY;  Surgeon: [name], MD;  Location: [facility] ORS;  Service: Urology;  Laterality: N/A;   CYSTOSCOPY WITH BIOPSY N/A 12/21/2017   Procedure: CYSTOSCOPY WITH BIOPSY;  Surgeon: [name], MD;  Location: [facility] ORS;  Service: Urology;  Laterality: N/A;   ESOPHAGOGASTRODUODENOSCOPY (EGD) WITH PROPOFOL N/A 12/08/2015   Procedure: ESOPHAGOGASTRODUODENOSCOPY (EGD) WITH PROPOFOL;  Surgeon: Lollie Sails, MD;  Location: [facility] ENDOSCOPY;  Service: Endoscopy;  Laterality:  N/A;   ESOPHAGOGASTRODUODENOSCOPY (EGD) WITH PROPOFOL N/A 12/13/2017   Procedure: ESOPHAGOGASTRODUODENOSCOPY (EGD) WITH PROPOFOL;  Surgeon: Lollie Sails, MD;  Location: [facility] ENDOSCOPY;  Service: Endoscopy;  Laterality: N/A;   EYE SURGERY     HERNIA REPAIR     kidney tumor remove     TRANSURETHRAL RESECTION OF BLADDER TUMOR WITH GYRUS (TURBT-GYRUS)  16/9678   UMBILICAL HERNIA REPAIR     urethral meatotomy     Patient Active Problem List   Diagnosis Date Noted   Class 2 obesity due to excess calories with body mass index (BMI) of 36.0 to 36.9 in adult 04/11/2020   Swelling of limb 03/28/2020   Lymphedema 03/28/2020   Trigeminal neuralgia 12/25/2019   Lower limb ulcer, calf, left, limited to breakdown of skin ([place]) 12/25/2019   OSA (obstructive sleep apnea) 93/81/0175   Acute systolic CHF (congestive heart failure) ([place]) 12/12/2018   Bruising 07/10/2018   Diet-controlled type 2 diabetes mellitus ([place]) 03/24/2018   Microalbuminuria 03/24/2018   Dizziness 11/17/2017   Simple chronic bronchitis ([place]) 09/22/2017   SOBOE (shortness of breath on exertion) 02/18/2017   Chronic midline low back pain without sciatica 12/29/2016   Periodic limb movement disorder 12/29/2016   Benign essential tremor 06/22/2016   Pure hypercholesterolemia 06/20/2015   Erectile dysfunction due to arterial insufficiency 06/16/2015   Unstable angina ([place]) 01/22/2015   Abdominal aortic 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 rupture ([place]) 12/31/2014   Benign prostatic hypertrophy without urinary obstruction 07/31/2014   Enlarged prostate 07/31/2014   Benign essential HTN 06/11/2014   Carotid artery narrowing 02/08/2014   Carotid artery obstruction 02/08/2014   Bilateral carotid artery stenosis 02/08/2014   Chest pain 08/20/2013   Bilateral cataracts 05/16/2013   Cataract 05/16/2013   Clinical depression 12/12/2012   Diabetes mellitus, type 2 ([place]) 12/12/2012   Combined fat and carbohydrate induced hyperlipemia  12/12/2012   Major depressive disorder, single episode, unspecified 12/12/2012   Acid reflux 11/09/2012   Presence of stent in coronary artery 11/09/2012   BP (high blood pressure) 11/09/2012   Healed myocardial infarct 11/09/2012   Gastro-esophageal reflux disease without esophagitis 11/09/2012   History of cardiovascular surgery 11/09/2012   Fothergill's neuralgia 08/07/2012   Swelling of testicle 04/06/2012   Disorder of male genital organ 04/06/2012   Swelling of the testicles 04/06/2012   Arteriosclerosis of coronary artery 11/16/2011   CAD in native artery 11/16/2011   Fatigue 06/04/2011   Avitaminosis D 11/30/2010    REFERRING DIAG: Repeated falls   THERAPY DIAG:   Difficulty in walking, not elsewhere classified  Muscle weakness (generalized)  Abnormality of gait and mobility  Other abnormalities of gait and mobility  Other lack of coordination  Unsteadiness on feet  Chronic bilateral low back pain without sciatica  Rationale for Evaluation  and Treatment Rehabilitation  PERTINENT HISTORY: Patient is a 76 year old male with referral for Physical Therapy with diagnosis of Parkinsons disease and history of falling. He reports he diagnosed last year with Parkinsons and has improved his tremors with use of medication but reports worsening shuffling and difficulty with mobility. Patient has past medical history signifiant for Parkinsons; Arthritis, bladder cancer, Trigemic Neuralgia. He lives with wife in 1 level home and current using cane with reported recent fall.   PRECAUTIONS: Falls  SUBJECTIVE: Pt's wife reports pt to have had a fall out of the bed.  He got wrapped up in the bed-rail and wife had to assist with him getting back up.  PAIN:  None   TODAY'S TREATMENT:   09/15/21 Therex   Seated hip isometric adduction into physioball, x15 Seated hip isometric abduction into physioball with therapy resistance, x15  Step tap onto 6" block  with 3lb AW BLE x 12  reps  Stand step ups on 6" block x 10 reps each LE 4lb AW Sit to stand from elevated to lower surface - 19 in 2x10 - all without UE support Seated LAQ 4lb AW- with 2 sec hold alt LE x 20 reps x 2 sets.  Supine LTR, 2x10 each side for back relief Supine bridging with focus on glute activation prior to lift-off, 2x10 Kettlebell dead lift from 6" step with 15# kettlebell and verbal cues for proper posture, x10   Education provided throughout session via VC/TC and demonstration to facilitate movement at target joints and correct muscle activation for all testing and exercises performed.     PATIENT EDUCATION: Education details: Exercise technique,proper posture Person educated: Patient Education method: Explanation, Demonstration, Tactile cues, and Verbal cues Education comprehension: verbalized understanding, returned demonstration, verbal cues required, tactile cues required, and needs further education   HOME EXERCISE PROGRAM: No updates today   PT Short Term Goals -       PT SHORT TERM GOAL #1   Title Pt will be independent with INITIAL  HEP in order to improve strength and balance in order to decrease fall risk and improve function at home and work.    Baseline 04/07/2021= No formal HEP in place. 05/11/2021= Patient reports doing pretty good- compliant with walking program and his stretching/strengthening.    Time 6    Period Weeks    Status Achieved    Target Date 05/19/21      PT SHORT TERM GOAL #2   Title Pt will decrease 5TSTS by at least 3 seconds in order to demonstrate clinically significant improvement in LE strength.    Baseline 04/07/2021= 25 sec; 05/11/2021= 18.65 sec    Time 6    Period Weeks    Status Achieved    Target Date 06/30/21              PT Long Term Goals -       PT LONG TERM GOAL #1   Title Pt will be independent with FINAL HEP in order to improve strength and balance in order to decrease fall risk and improve function at home and work.     Baseline 04/07/2021= No formal HEP in place. 07/01/2021=Patient reports performing walking and some standing LE strengthening exercises as instructed and states no questions at this time.  09/02/21: Pt reports he feels indep with exercises but is not performing HEP as much as he should   Time 12    Period Weeks    Status Partially Met  Target Date 09/23/21      PT LONG TERM GOAL #2   Title Pt will improve FOTO to target score of 45% to display perceived improvements in ability to complete ADL's.    Baseline 2/7/22023= 41%; FOTO=53%    Time 12    Period Weeks    Status Achieved    Target Date 06/30/21      PT LONG TERM GOAL #3   Title Pt will decrease 5TSTS by at least 5 seconds in order to demonstrate clinically significant improvement in LE strength.    Baseline 04/07/2021= 25 sec; 05/11/2021= 18.65 sec without UE support; 4/10= 13.76 sec without UE support    Time 12    Period Weeks    Status Achieved    Target Date 06/30/21      PT LONG TERM GOAL #4   Title Pt will decrease TUG to below 17 seconds/decrease in order to demonstrate decreased fall risk.    Baseline 04/07/2021= 21 sec with SPC; 05/11/2021= 18.85 sec with use of cane; 07/21/2021=18.88 sec  avg with use of cane; 7/5: 22.85 sec with QC   Time 12    Period Weeks    Status Partially Met    Target Date 09/23/21      PT LONG TERM GOAL #5   Title Pt will increase 10MWT by at least 0.15 m/s in order to demonstrate clinically significant improvement in community ambulation.    Baseline 04/07/2021= 0.56 m/s using SPC; 05/11/2021= 0.54 m/s using SPC; 07/01/2021= 0.6 m/s/ 7/5: 0.48 m/s without AD but with CGA   Time 12    Period Weeks    Status On-going    Target Date 09/23/21      PT LONG TERM GOAL #6   Title Pt will increase 6MWT by at least 76m(1648f in order to demonstrate clinically significant improvement in cardiopulmonary endurance and community ambulation    Baseline 06/08/2021= 600 ft. in 5 min- Stopped due to fatigue using 4WW;  07/01/2021= 665 in 5 min 45 sec using 4WW; 7/5: 367 ft with QC   Time 4    Period Weeks    Status On-going    Target Date 09/23/21              Plan - 08/20/21 0939     Clinical Impression Statement Pt performed well with increased weight of tasks, reps, and the introduction of more strengthening based exercises.  Pt with notable back pain and was instructed on LTR and glute strengthening exercises in order to relieve back pain by strengthening muscles that should engage rather than the back.  Pt to continue to benefit from strengthening exercises in order to improve overall condition and return to PLOF.    Personal Factors and Comorbidities Comorbidity 3+    Comorbidities arthritis, bladder cancer, Trigeminal Neuralgia    Examination-Activity Limitations Caring for Others;Carry;Continence;Lift;Squat;Stairs;Stand    Examination-Participation Restrictions Community Activity;Yard Work    [employer] officervolving/Moderate complexity    Rehab Potential Good    PT Frequency 2x / week    PT Duration 12 weeks    PT Treatment/Interventions ADLs/Self Care Home Management;Cryotherapy;Canalith Repostioning;Electrical Stimulation;Moist Heat;DME Instruction;Gait training;Stair training;Functional mobility training;Therapeutic activities;Therapeutic exercise;Balance training;Neuromuscular re-education;Patient/family education;Manual techniques;Passive range of motion;Dry needling;Vestibular    PT Next Visit Plan LE strength, improve step length with ambulation, muscle tissue lengthening    PT Home Exercise Plan Access Code: [email or web address]              JoGwenlyn SaranPT, DPT  09/15/21, 10:46 AM

## 2021-09-16 DIAGNOSIS — R600 Localized edema: Secondary | ICD-10-CM | POA: Insufficient documentation

## 2021-09-17 ENCOUNTER — Ambulatory Visit: Payer: Medicare HMO

## 2021-09-22 ENCOUNTER — Ambulatory Visit: Payer: Medicare HMO

## 2021-09-22 DIAGNOSIS — M6281 Muscle weakness (generalized): Secondary | ICD-10-CM

## 2021-09-22 DIAGNOSIS — R262 Difficulty in walking, not elsewhere classified: Secondary | ICD-10-CM | POA: Diagnosis not present

## 2021-09-22 DIAGNOSIS — R269 Unspecified abnormalities of gait and mobility: Secondary | ICD-10-CM

## 2021-09-22 NOTE — Therapy (Signed)
OUTPATIENT PHYSICAL THERAPY TREATMENT NOTE    Patient Name: [name] MRN: [id number] DOB:[birth year], 76 y.o., male Today's Date: 09/22/2021  PCP: [name]. [name], FNP REFERRING PROVIDER: Dr. Gurney Maxin   PT End of Session - 09/22/21 0940     Visit Number 45    Number of Visits 50    Date for PT Re-Evaluation 09/23/21    Authorization Type [employer]    Authorization Time Period 07/01/21-09/20/21    Progress Note Due on Visit 50    PT Start Time 0930    PT Stop Time 1010    PT Time Calculation (min) 40 min    Equipment Utilized During Treatment Gait belt    Activity Tolerance Patient tolerated treatment well;No increased pain    Behavior During Therapy [facility] for tasks assessed/performed               Past Medical History:  Diagnosis Date   Anxiety    Arteriosclerosis of coronary artery 11/16/2011   Overview:  Stent 10/2011 stent rca 2015 with collaterals to lad which is chronically occluded    Benign enlargement of prostate    Benign essential HTN 06/11/2014   Benign prostatic hypertrophy without urinary obstruction 07/31/2014   Bilateral cataracts 05/16/2013   Overview:  Dr. [facility]     Bone spur of foot    Left   BP (high blood pressure) 11/09/2012   Cancer ([place])    skin (forehead) and bladder   Carotid artery narrowing 02/08/2014   Depression    Detrusor hypertrophy    Diabetes ([place])    Diabetes mellitus, type 2 ([place]) 12/12/2012   Diverticulosis    Dyspnea    Esophageal reflux    Esophageal reflux    Fothergill's neuralgia 08/07/2012   Overview:  [facility]    Gastritis    GERD (gastroesophageal reflux disease)    Headache    cluster headaches   Healed myocardial infarct 11/09/2012   Hearing loss in left ear    Heart disease    Hematuria    Hemorrhoids    History of hiatal hernia 12/14/2017   small    Hypercholesteremia    Lesion of bladder    Myocardial infarct (HCC)    Presence of stent in coronary artery 11/09/2012   Rectal  bleeding    Trigeminal neuralgia    Trigeminal neuralgia    Valvular heart disease    Vitamin D deficiency    Past Surgical History:  Procedure Laterality Date   APPENDECTOMY     BOTOX INJECTION N/A 12/21/2017   Procedure: Bladder BOTOX INJECTION;  Surgeon: [name], MD;  Location: [facility] ORS;  Service: Urology;  Laterality: N/A;   BOTOX INJECTION N/A 09/11/2018   Procedure: Bladder BOTOX INJECTION;  Surgeon: [name], MD;  Location: [facility] ORS;  Service: Urology;  Laterality: N/A;   CARDIAC CATHETERIZATION     CARDIAC CATHETERIZATION N/A 01/22/2015   Procedure: Left Heart Cath;  Surgeon: Corey Skains, MD;  Location: [place] [facility];  Service: Cardiovascular;  Laterality: N/A;   CARDIAC CATHETERIZATION N/A 01/22/2015   Procedure: Coronary Stent Intervention;  Surgeon: Isaias Cowman, MD;  Location: [place] CV LAB;  Service: Cardiovascular;  Laterality: N/A;   CATARACT EXTRACTION, BILATERAL     COLONOSCOPY WITH PROPOFOL N/A 12/08/2015   Procedure: COLONOSCOPY WITH PROPOFOL;  Surgeon: Lollie Sails, MD;  Location: [facility] ENDOSCOPY;  Service: Endoscopy;  Laterality: N/A;   COLONOSCOPY WITH PROPOFOL N/A 12/09/2015  Procedure: COLONOSCOPY WITH PROPOFOL;  Surgeon: Lollie Sails, MD;  Location: [facility] ENDOSCOPY;  Service: Endoscopy;  Laterality: N/A;   CORONARY ANGIOPLASTY     5 stents   CORONARY STENT PLACEMENT  2015   x5   CYSTOSCOPY N/A 09/11/2018   Procedure: CYSTOSCOPY;  Surgeon: [name], MD;  Location: [facility] ORS;  Service: Urology;  Laterality: N/A;   CYSTOSCOPY WITH BIOPSY N/A 12/21/2017   Procedure: CYSTOSCOPY WITH BIOPSY;  Surgeon: [name], MD;  Location: [facility] ORS;  Service: Urology;  Laterality: N/A;   ESOPHAGOGASTRODUODENOSCOPY (EGD) WITH PROPOFOL N/A 12/08/2015   Procedure: ESOPHAGOGASTRODUODENOSCOPY (EGD) WITH PROPOFOL;  Surgeon: Lollie Sails, MD;  Location: [facility] ENDOSCOPY;  Service: Endoscopy;  Laterality: N/A;    ESOPHAGOGASTRODUODENOSCOPY (EGD) WITH PROPOFOL N/A 12/13/2017   Procedure: ESOPHAGOGASTRODUODENOSCOPY (EGD) WITH PROPOFOL;  Surgeon: Lollie Sails, MD;  Location: [facility] ENDOSCOPY;  Service: Endoscopy;  Laterality: N/A;   EYE SURGERY     HERNIA REPAIR     kidney tumor remove     TRANSURETHRAL RESECTION OF BLADDER TUMOR WITH GYRUS (TURBT-GYRUS)  85/4627   UMBILICAL HERNIA REPAIR     urethral meatotomy     Patient Active Problem List   Diagnosis Date Noted   Class 2 obesity due to excess calories with body mass index (BMI) of 36.0 to 36.9 in adult 04/11/2020   Swelling of limb 03/28/2020   Lymphedema 03/28/2020   Trigeminal neuralgia 12/25/2019   Lower limb ulcer, calf, left, limited to breakdown of skin ([place]) 12/25/2019   OSA (obstructive sleep apnea) 03/50/0938   Acute systolic CHF (congestive heart failure) ([place]) 12/12/2018   Bruising 07/10/2018   Diet-controlled type 2 diabetes mellitus ([place]) 03/24/2018   Microalbuminuria 03/24/2018   Dizziness 11/17/2017   Simple chronic bronchitis ([place]) 09/22/2017   SOBOE (shortness of breath on exertion) 02/18/2017   Chronic midline low back pain without sciatica 12/29/2016   Periodic limb movement disorder 12/29/2016   Benign essential tremor 06/22/2016   Pure hypercholesterolemia 06/20/2015   Erectile dysfunction due to arterial insufficiency 06/16/2015   Unstable angina ([place]) 01/22/2015   Abdominal aortic 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 rupture ([place]) 12/31/2014   Benign prostatic hypertrophy without urinary obstruction 07/31/2014   Enlarged prostate 07/31/2014   Benign essential HTN 06/11/2014   Carotid artery narrowing 02/08/2014   Carotid artery obstruction 02/08/2014   Bilateral carotid artery stenosis 02/08/2014   Chest pain 08/20/2013   Bilateral cataracts 05/16/2013   Cataract 05/16/2013   Clinical depression 12/12/2012   Diabetes mellitus, type 2 ([place]) 12/12/2012   Combined fat and carbohydrate induced hyperlipemia 12/12/2012    Major depressive disorder, single episode, unspecified 12/12/2012   Acid reflux 11/09/2012   Presence of stent in coronary artery 11/09/2012   BP (high blood pressure) 11/09/2012   Healed myocardial infarct 11/09/2012   Gastro-esophageal reflux disease without esophagitis 11/09/2012   History of cardiovascular surgery 11/09/2012   Fothergill's neuralgia 08/07/2012   Swelling of testicle 04/06/2012   Disorder of male genital organ 04/06/2012   Swelling of the testicles 04/06/2012   Arteriosclerosis of coronary artery 11/16/2011   CAD in native artery 11/16/2011   Fatigue 06/04/2011   Avitaminosis D 11/30/2010    REFERRING DIAG: Repeated falls   THERAPY DIAG:   Difficulty in walking, not elsewhere classified  Muscle weakness (generalized)  Abnormality of gait and mobility  Rationale for Evaluation and Treatment Rehabilitation   Subjective Assessment - 09/22/21 0936     Subjective Pt doing ok today, still struggling with recent fluid  retention issues. He is still on increased diuetics, useing compression booties twice daily. His pedal edema has improved somewhat but lower legs are still swolen. Pt denies any pain. Pt reports minimal HEP activity recently at home.    Patient is accompained by: Family member    Pertinent History Patient is a 76 year old male with referral for Physical Therapy with diagnosis of Parkinsons disease and history of falling. He reports he diagnosed last year with Parkinsons and has improved his tremors with use of medication but reports worsening shuffling and difficulty with mobility. Patient has past medical history signifiant for Parkinsons; Arthritis, bladder cancer, Trigemic Neuralgia. He lives with wife in 1 level home and current using cane with reported recent fall.    Limitations Walking;Lifting;Standing;House hold activities    How long can you sit comfortably? No limits    How long can you stand comfortably? 15 min- limited due to back pain and  foot numbess    How long can you walk comfortably? about 80 feet    Patient Stated Goals Improve my balance, be able to get up off the floor so I can maybe work in my garage again.    Currently in Pain? No/denies               PRECAUTIONS: Falls   PAIN:  None   TODAY'S TREATMENT:  -Seated hip isometric adduction into physioball x15 (the [employer])  -seated heel raise, forefeet on half roll bolster 1x15  -seated dorsiflexion, heels on half roll bolster 1x15  -seated heel raise, forefeet on half roll bolster 1x15  -seated dorsiflexion, heels on half roll bolster 1x15   - Sit to stand from elevated to lower surface - 19 in 1x10 - all without UE support - standing trunk extension scaps into pillow into wall 10x3sec H - Sit to stand from elevated to lower surface - 19 in 1x10 - all without UE support  -mid session vitals 128/68 BP 68bpm seated -124/78 67bpm standing   -Overground AMB with Ustep 4WW- 359f 0.457m -backward AMB [id number] Assist (no device)  -AMB lap around stool 1x CW, 1x CCW (no device, minguardA)  -FWD AMB [id number] UsteWhole Foodsker   Education provided throughout session via VC/TC and demonstration to facilitate movement at target joints and correct muscle activation for all testing and exercises performed.     PATIENT EDUCATION: Education details: Exercise technique, instruction in use of AD (see note for details) Person educated: Patient Education method: Explanation, Demonstration, Tactile cues, and Verbal cues Education comprehension: verbalized understanding, returned demonstration, verbal cues required, tactile cues required, and needs further education   HOME EXERCISE PROGRAM: No updates today   PT Short Term Goals -       PT SHORT TERM GOAL #1   Title Pt will be independent with INITIAL  HEP in order to improve strength and balance in order to decrease fall risk and improve function at home and work.    Baseline 04/07/2021= No formal  HEP in place. 05/11/2021= Patient reports doing pretty good- compliant with walking program and his stretching/strengthening.    Time 6    Period Weeks    Status Achieved    Target Date 05/19/21      PT SHORT TERM GOAL #2   Title Pt will decrease 5TSTS by at least 3 seconds in order to demonstrate clinically significant improvement in LE strength.    Baseline 04/07/2021= 25 sec; 05/11/2021= 18.65 sec    Time 6  Period Weeks    Status Achieved    Target Date 06/30/21              PT Long Term Goals -       PT LONG TERM GOAL #1   Title Pt will be independent with FINAL HEP in order to improve strength and balance in order to decrease fall risk and improve function at home and work.    Baseline 04/07/2021= No formal HEP in place. 07/01/2021=Patient reports performing walking and some standing LE strengthening exercises as instructed and states no questions at this time.  09/02/21: Pt reports he feels indep with exercises but is not performing HEP as much as he should   Time 12    Period Weeks    Status Partially Met    Target Date 09/23/21      PT LONG TERM GOAL #2   Title Pt will improve FOTO to target score of 45% to display perceived improvements in ability to complete ADL's.    Baseline 2/7/22023= 41%; FOTO=53%    Time 12    Period Weeks    Status Achieved    Target Date 06/30/21      PT LONG TERM GOAL #3   Title Pt will decrease 5TSTS by at least 5 seconds in order to demonstrate clinically significant improvement in LE strength.    Baseline 04/07/2021= 25 sec; 05/11/2021= 18.65 sec without UE support; 4/10= 13.76 sec without UE support    Time 12    Period Weeks    Status Achieved    Target Date 06/30/21      PT LONG TERM GOAL #4   Title Pt will decrease TUG to below 17 seconds/decrease in order to demonstrate decreased fall risk.    Baseline 04/07/2021= 21 sec with SPC; 05/11/2021= 18.85 sec with use of cane; 07/21/2021=18.88 sec  avg with use of cane; 7/5: 22.85 sec with QC    Time 12    Period Weeks    Status Partially Met    Target Date 09/23/21      PT LONG TERM GOAL #5   Title Pt will increase 10MWT by at least 0.15 m/s in order to demonstrate clinically significant improvement in community ambulation.    Baseline 04/07/2021= 0.56 m/s using SPC; 05/11/2021= 0.54 m/s using SPC; 07/01/2021= 0.6 m/s/ 7/5: 0.48 m/s without AD but with CGA   Time 12    Period Weeks    Status On-going    Target Date 09/23/21      PT LONG TERM GOAL #6   Title Pt will increase 6MWT by at least 62m(1668f in order to demonstrate clinically significant improvement in cardiopulmonary endurance and community ambulation    Baseline 06/08/2021= 600 ft. in 5 min- Stopped due to fatigue using 4WW; 07/01/2021= 665 in 5 min 45 sec using 4WW; 7/5: 367 ft with QC   Time 4    Period Weeks    Status On-going    Target Date 09/23/21              Plan - 08/20/21 0939     Clinical Impression Statement Patient presented with good motivation for today's session. Continured with strength interventions, leading into postural muscles, then overgroudn gait and balance training. Pt given rest intervals as needed throughout session. Pt struggles with stability in directions of AMB other than FWD. The patient will continued to benefit from skilled PT services to address deficits in gait, strength, endurance and balance to decrease risk  for future falls.    Personal Factors and Comorbidities Comorbidity 3+    Comorbidities arthritis, bladder cancer, Trigeminal Neuralgia    Examination-Activity Limitations Caring for Others;Carry;Continence;Lift;Squat;Stairs;Stand    Examination-Participation Restrictions Community Activity;Yard Work    [employer] Evolving/Moderate complexity    Rehab Potential Good    PT Frequency 2x / week    PT Duration 12 weeks    PT Treatment/Interventions ADLs/Self Care Home Management;Cryotherapy;Canalith Repostioning;Electrical Stimulation;Moist Heat;DME  Instruction;Gait training;Stair training;Functional mobility training;Therapeutic activities;Therapeutic exercise;Balance training;Neuromuscular re-education;Patient/family education;Manual techniques;Passive range of motion;Dry needling;Vestibular    PT Next Visit Plan LE strength, improve step length with ambulation, muscle tissue lengthening    PT Home Exercise Plan Access Code: [email or web address]            9:45 AM, 09/22/21 Etta Grandchild, PT, DPT Physical Therapist - [place] [phone]      Zaidyn Claire C, PT 09/22/2021, 9:45 AM

## 2021-09-24 ENCOUNTER — Ambulatory Visit: Payer: Medicare HMO

## 2021-09-24 DIAGNOSIS — G8929 Other chronic pain: Secondary | ICD-10-CM

## 2021-09-24 DIAGNOSIS — R262 Difficulty in walking, not elsewhere classified: Secondary | ICD-10-CM

## 2021-09-24 DIAGNOSIS — R2689 Other abnormalities of gait and mobility: Secondary | ICD-10-CM

## 2021-09-24 DIAGNOSIS — R2681 Unsteadiness on feet: Secondary | ICD-10-CM

## 2021-09-24 DIAGNOSIS — M6281 Muscle weakness (generalized): Secondary | ICD-10-CM

## 2021-09-24 DIAGNOSIS — R269 Unspecified abnormalities of gait and mobility: Secondary | ICD-10-CM

## 2021-09-24 DIAGNOSIS — R278 Other lack of coordination: Secondary | ICD-10-CM

## 2021-09-24 NOTE — Therapy (Addendum)
OUTPATIENT PHYSICAL THERAPY TREATMENT NOTE/RERTIFICATION FOR DATES 09/24/2021- 12/15/2021    Patient Name: [name] MRN: [id number] DOB:[birth year], 76 y.o., male Today's Date: 09/24/2021  PCP: [name]. [name], [place] PROVIDER: Dr. Gurney Maxin   PT End of Session - 09/24/21 0930     Visit Number 46    Number of Visits 50    Date for PT Re-Evaluation 09/23/21    Authorization Type [employer]    Authorization Time Period 07/01/21-09/20/21    Progress Note Due on Visit 50    PT Start Time 0930    PT Stop Time 1014    PT Time Calculation (min) 44 min    Equipment Utilized During Treatment Gait belt    Activity Tolerance Patient tolerated treatment well;No increased pain    Behavior During Therapy [facility] for tasks assessed/performed               Past Medical History:  Diagnosis Date   Anxiety    Arteriosclerosis of coronary artery 11/16/2011   Overview:  Stent 10/2011 stent rca 2015 with collaterals to lad which is chronically occluded    Benign enlargement of prostate    Benign essential HTN 06/11/2014   Benign prostatic hypertrophy without urinary obstruction 07/31/2014   Bilateral cataracts 05/16/2013   Overview:  Dr. [facility]     Bone spur of foot    Left   BP (high blood pressure) 11/09/2012   Cancer ([place])    skin (forehead) and bladder   Carotid artery narrowing 02/08/2014   Depression    Detrusor hypertrophy    Diabetes ([place])    Diabetes mellitus, type 2 ([place]) 12/12/2012   Diverticulosis    Dyspnea    Esophageal reflux    Esophageal reflux    Fothergill's neuralgia 08/07/2012   Overview:  [facility]    Gastritis    GERD (gastroesophageal reflux disease)    Headache    cluster headaches   Healed myocardial infarct 11/09/2012   Hearing loss in left ear    Heart disease    Hematuria    Hemorrhoids    History of hiatal hernia 12/14/2017   small    Hypercholesteremia    Lesion of bladder    Myocardial infarct (HCC)    Presence of  stent in coronary artery 11/09/2012   Rectal bleeding    Trigeminal neuralgia    Trigeminal neuralgia    Valvular heart disease    Vitamin D deficiency    Past Surgical History:  Procedure Laterality Date   APPENDECTOMY     BOTOX INJECTION N/A 12/21/2017   Procedure: Bladder BOTOX INJECTION;  Surgeon: [name], MD;  Location: [facility] ORS;  Service: Urology;  Laterality: N/A;   BOTOX INJECTION N/A 09/11/2018   Procedure: Bladder BOTOX INJECTION;  Surgeon: [name], MD;  Location: [facility] ORS;  Service: Urology;  Laterality: N/A;   CARDIAC CATHETERIZATION     CARDIAC CATHETERIZATION N/A 01/22/2015   Procedure: Left Heart Cath;  Surgeon: Corey Skains, MD;  Location: [place] CV LAB;  Service: Cardiovascular;  Laterality: N/A;   CARDIAC CATHETERIZATION N/A 01/22/2015   Procedure: Coronary Stent Intervention;  Surgeon: Isaias Cowman, MD;  Location: [place] CV LAB;  Service: Cardiovascular;  Laterality: N/A;   CATARACT EXTRACTION, BILATERAL     COLONOSCOPY WITH PROPOFOL N/A 12/08/2015   Procedure: COLONOSCOPY WITH PROPOFOL;  Surgeon: Lollie Sails, MD;  Location: [facility] ENDOSCOPY;  Service: Endoscopy;  Laterality: N/A;   COLONOSCOPY WITH PROPOFOL  N/A 12/09/2015   Procedure: COLONOSCOPY WITH PROPOFOL;  Surgeon: Lollie Sails, MD;  Location: [facility] ENDOSCOPY;  Service: Endoscopy;  Laterality: N/A;   CORONARY ANGIOPLASTY     5 stents   CORONARY STENT PLACEMENT  2015   x5   CYSTOSCOPY N/A 09/11/2018   Procedure: CYSTOSCOPY;  Surgeon: [name], MD;  Location: [facility] ORS;  Service: Urology;  Laterality: N/A;   CYSTOSCOPY WITH BIOPSY N/A 12/21/2017   Procedure: CYSTOSCOPY WITH BIOPSY;  Surgeon: [name], MD;  Location: [facility] ORS;  Service: Urology;  Laterality: N/A;   ESOPHAGOGASTRODUODENOSCOPY (EGD) WITH PROPOFOL N/A 12/08/2015   Procedure: ESOPHAGOGASTRODUODENOSCOPY (EGD) WITH PROPOFOL;  Surgeon: Lollie Sails, MD;  Location: [facility] ENDOSCOPY;  Service:  Endoscopy;  Laterality: N/A;   ESOPHAGOGASTRODUODENOSCOPY (EGD) WITH PROPOFOL N/A 12/13/2017   Procedure: ESOPHAGOGASTRODUODENOSCOPY (EGD) WITH PROPOFOL;  Surgeon: Lollie Sails, MD;  Location: [facility] ENDOSCOPY;  Service: Endoscopy;  Laterality: N/A;   EYE SURGERY     HERNIA REPAIR     kidney tumor remove     TRANSURETHRAL RESECTION OF BLADDER TUMOR WITH GYRUS (TURBT-GYRUS)  97/6734   UMBILICAL HERNIA REPAIR     urethral meatotomy     Patient Active Problem List   Diagnosis Date Noted   Class 2 obesity due to excess calories with body mass index (BMI) of 36.0 to 36.9 in adult 04/11/2020   Swelling of limb 03/28/2020   Lymphedema 03/28/2020   Trigeminal neuralgia 12/25/2019   Lower limb ulcer, calf, left, limited to breakdown of skin ([place]) 12/25/2019   OSA (obstructive sleep apnea) 19/37/9024   Acute systolic CHF (congestive heart failure) ([place]) 12/12/2018   Bruising 07/10/2018   Diet-controlled type 2 diabetes mellitus ([place]) 03/24/2018   Microalbuminuria 03/24/2018   Dizziness 11/17/2017   Simple chronic bronchitis ([place]) 09/22/2017   SOBOE (shortness of breath on exertion) 02/18/2017   Chronic midline low back pain without sciatica 12/29/2016   Periodic limb movement disorder 12/29/2016   Benign essential tremor 06/22/2016   Pure hypercholesterolemia 06/20/2015   Erectile dysfunction due to arterial insufficiency 06/16/2015   Unstable angina ([place]) 01/22/2015   Abdominal aortic 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 rupture ([place]) 12/31/2014   Benign prostatic hypertrophy without urinary obstruction 07/31/2014   Enlarged prostate 07/31/2014   Benign essential HTN 06/11/2014   Carotid artery narrowing 02/08/2014   Carotid artery obstruction 02/08/2014   Bilateral carotid artery stenosis 02/08/2014   Chest pain 08/20/2013   Bilateral cataracts 05/16/2013   Cataract 05/16/2013   Clinical depression 12/12/2012   Diabetes mellitus, type 2 ([place]) 12/12/2012   Combined fat and carbohydrate  induced hyperlipemia 12/12/2012   Major depressive disorder, single episode, unspecified 12/12/2012   Acid reflux 11/09/2012   Presence of stent in coronary artery 11/09/2012   BP (high blood pressure) 11/09/2012   Healed myocardial infarct 11/09/2012   Gastro-esophageal reflux disease without esophagitis 11/09/2012   History of cardiovascular surgery 11/09/2012   Fothergill's neuralgia 08/07/2012   Swelling of testicle 04/06/2012   Disorder of male genital organ 04/06/2012   Swelling of the testicles 04/06/2012   Arteriosclerosis of coronary artery 11/16/2011   CAD in native artery 11/16/2011   Fatigue 06/04/2011   Avitaminosis D 11/30/2010    REFERRING DIAG: Repeated falls   THERAPY DIAG:   Difficulty in walking, not elsewhere classified  Muscle weakness (generalized)  Abnormality of gait and mobility  Other abnormalities of gait and mobility  Other lack of coordination  Unsteadiness on feet  Chronic bilateral low back pain without sciatica  Rationale for Evaluation and Treatment Rehabilitation   Subjective Assessment - 09/22/21 0936     Subjective Pt doing ok today- states his friend gave him an upright walker and he has been using it for last 2 weeks. Denies any falls in last 2 weeks. Denies any back pain with using new walker.    Patient is accompained by: Family member    Pertinent History Patient is a 76 year old male with referral for Physical Therapy with diagnosis of Parkinsons disease and history of falling. He reports he diagnosed last year with Parkinsons and has improved his tremors with use of medication but reports worsening shuffling and difficulty with mobility. Patient has past medical history signifiant for Parkinsons; Arthritis, bladder cancer, Trigemic Neuralgia. He lives with wife in 1 level home and current using cane with reported recent fall.    Limitations Walking;Lifting;Standing;House hold activities    How long can you sit comfortably? No  limits    How long can you stand comfortably? 15 min- limited due to back pain and foot numbess    How long can you walk comfortably? about 80 feet    Patient Stated Goals Improve my balance, be able to get up off the floor so I can maybe work in my garage again.    Currently in Pain? No/denies               PRECAUTIONS: Falls   PAIN:  None   TODAY'S TREATMENT:  INTERVAL Nustep training: LE only with VC to keep SPM > 50 spm  L3x 2 min L5 x 2 min L2 x 2 min Total distance= 0.16 mi O2 sat=96%; HR=90 bpm    -Seated hip isometric adduction into physioball x15 (the [place] [employer])  -seated heel raise, forefeet on half roll bolster 1x15  -seated dorsiflexion, heels on half roll bolster 1x15  -seated heel raise, forefeet on half roll bolster 1x15  -seated dorsiflexion, heels on half roll bolster 1x15   - Sit to stand from elevated to lower surface - 23 in  1x5 - all without UE support - Sit to stand from elevated to lower surface - 21 in 1x5 - all without UE support - Sit to stand from elevated to lower surface - 20 in 1x5 - all without UE support   -Overground AMB with Upright  4WW- 150 ft 0.54 m/s  - Sit to stand while holding onto yellow ball with arms extended then rise and lift ball overhead x 10 reps total  -Overground AMB with Upright  4WW- 150 ft 0.51 m/s  - Supine bridging x 12 reps- VC for correct technique - sidelye- trunk rotation (open book) x 15 reps each side  Education provided throughout session via VC/TC and demonstration to facilitate movement at target joints and correct muscle activation for all testing and exercises performed.     PATIENT EDUCATION: Education details: Exercise technique, instruction in use of AD (see note for details) Person educated: Patient Education method: Explanation, Demonstration, Tactile cues, and Verbal cues Education comprehension: verbalized understanding, returned demonstration, verbal cues required, tactile cues  required, and needs further education   HOME EXERCISE PROGRAM: No updates today   PT Short Term Goals -       PT SHORT TERM GOAL #1   Title Pt will be independent with INITIAL  HEP in order to improve strength and balance in order to decrease fall risk and improve function at home and work.    Baseline 04/07/2021= No formal HEP  in place. 05/11/2021= Patient reports doing pretty good- compliant with walking program and his stretching/strengthening.    Time 6    Period Weeks    Status Achieved    Target Date 05/19/21      PT SHORT TERM GOAL #2   Title Pt will decrease 5TSTS by at least 3 seconds in order to demonstrate clinically significant improvement in LE strength.    Baseline 04/07/2021= 25 sec; 05/11/2021= 18.65 sec    Time 6    Period Weeks    Status Achieved    Target Date 06/30/21              PT Long Term Goals -       PT LONG TERM GOAL #1   Title Pt will be independent with FINAL HEP in order to improve strength and balance in order to decrease fall risk and improve function at home and work.    Baseline 04/07/2021= No formal HEP in place. 07/01/2021=Patient reports performing walking and some standing LE strengthening exercises as instructed and states no questions at this time.  09/02/21: Pt reports he feels indep with exercises but is not performing HEP as much as he should; 7/272/2023- Patient reports mostly walking and doing his up/downs - reports not as compliant with stretching or balance exercises.    Time 12    Period Weeks    Status Partially Met    Target Date 12/15/2021     PT LONG TERM GOAL #2   Title Pt will improve FOTO to target score of 45% to display perceived improvements in ability to complete ADL's.    Baseline 2/7/22023= 41%; FOTO=53%    Time 12    Period Weeks    Status Achieved    Target Date 06/30/21      PT LONG TERM GOAL #3   Title Pt will decrease 5TSTS by at least 5 seconds in order to demonstrate clinically significant improvement in LE  strength.    Baseline 04/07/2021= 25 sec; 05/11/2021= 18.65 sec without UE support; 4/10= 13.76 sec without UE support    Time 12    Period Weeks    Status Achieved    Target Date 06/30/21      PT LONG TERM GOAL #4   Title Pt will decrease TUG to below 17 seconds/decrease in order to demonstrate decreased fall risk.    Baseline 04/07/2021= 21 sec with SPC; 05/11/2021= 18.85 sec with use of cane; 07/21/2021=18.88 sec  avg with use of cane; 7/5: 22.85 sec with QC; 09/24/2021- Not tested today- will reassess and update goal next visit using upright 4WW.   Time 12    Period Weeks    Status Partially Met    Target Date 12/15/2021     PT LONG TERM GOAL #5   Title Pt will increase 10MWT by at least 0.15 m/s in order to demonstrate clinically significant improvement in community ambulation.    Baseline 04/07/2021= 0.56 m/s using SPC; 05/11/2021= 0.54 m/s using SPC; 07/01/2021= 0.6 m/s/ 7/5: 0.48 m/s without AD but with CGA; 09/24/2021- Not directly assessed 10 meter but gait speed measured at 0.51-0.54 m/s using upright 4WW.    Time 12    Period Weeks    Status On-going    Target Date 12/15/2021     PT LONG TERM GOAL #6   Title Pt will increase 6MWT by at least 20m(1634f in order to demonstrate clinically significant improvement in cardiopulmonary endurance and community ambulation  Baseline 06/08/2021= 600 ft. in 5 min- Stopped due to fatigue using 4WW; 07/01/2021= 665 in 5 min 45 sec using 4WW; 7/5: 367 ft with QC; 09/24/2021- will reassess next visit   Time 4    Period Weeks    Status On-going    Target Date 12/15/2021             Plan - 08/20/21 0939     Clinical Impression Statement Patient presents with good motivation for today's session. No back pain and patient enjoying using his upright walker- exhibiting improved gait speed today. He was tight with bed mobility and improved with trunk rotation with practice. He continues to require some VC for increased step length. He was able to perform  all sit to stand activities well without pain or UE support today.  Patient's condition has the potential to improve in response to therapy. Maximum improvement is yet to be obtained. The anticipated improvement is attainable and reasonable in a generally predictable time.  The patient will continue to benefit from skilled PT services to address deficits in gait, strength, endurance and balance to decrease risk for future falls.    Personal Factors and Comorbidities Comorbidity 3+    Comorbidities arthritis, bladder cancer, Trigeminal Neuralgia    Examination-Activity Limitations Caring for Others;Carry;Continence;Lift;Squat;Stairs;Stand    Examination-Participation Restrictions Community Activity;Yard Work    [employer] Evolving/Moderate complexity    Rehab Potential Good    PT Frequency 2x / week    PT Duration 12 weeks    PT Treatment/Interventions ADLs/Self Care Home Management;Cryotherapy;Canalith Repostioning;Electrical Stimulation;Moist Heat;DME Instruction;Gait training;Stair training;Functional mobility training;Therapeutic activities;Therapeutic exercise;Balance training;Neuromuscular re-education;Patient/family education;Manual techniques;Passive range of motion;Dry needling;Vestibular    PT Next Visit Plan LE strength, improve step length with ambulation, muscle tissue lengthening    PT Home Exercise Plan Access Code: [email or web address]            11:51 AM, 09/24/21    Lewis Moccasin, PT Physical Therapist - [place] [phone]

## 2021-09-29 ENCOUNTER — Ambulatory Visit: Payer: Medicare HMO | Attending: Neurology

## 2021-09-29 DIAGNOSIS — M545 Low back pain, unspecified: Secondary | ICD-10-CM | POA: Insufficient documentation

## 2021-09-29 DIAGNOSIS — R269 Unspecified abnormalities of gait and mobility: Secondary | ICD-10-CM | POA: Diagnosis present

## 2021-09-29 DIAGNOSIS — G8929 Other chronic pain: Secondary | ICD-10-CM | POA: Diagnosis present

## 2021-09-29 DIAGNOSIS — R262 Difficulty in walking, not elsewhere classified: Secondary | ICD-10-CM | POA: Diagnosis present

## 2021-09-29 DIAGNOSIS — R2689 Other abnormalities of gait and mobility: Secondary | ICD-10-CM | POA: Insufficient documentation

## 2021-09-29 DIAGNOSIS — R278 Other lack of coordination: Secondary | ICD-10-CM | POA: Diagnosis present

## 2021-09-29 DIAGNOSIS — M6281 Muscle weakness (generalized): Secondary | ICD-10-CM | POA: Insufficient documentation

## 2021-09-29 DIAGNOSIS — R2681 Unsteadiness on feet: Secondary | ICD-10-CM | POA: Insufficient documentation

## 2021-09-29 NOTE — Therapy (Signed)
OUTPATIENT PHYSICAL THERAPY TREATMENT NOTE    Patient Name: [name] MRN: [id number] DOB:December 16, 1945, 76 y.o., male Today's Date: 09/29/2021  PCP: [name]. [name], [place] PROVIDER: Dr. Gurney Maxin   PT End of Session - 09/29/21 1046     Visit Number 2    Number of Visits 37    Date for PT Re-Evaluation 12/15/21    Authorization Type [employer]    Authorization Time Period 07/01/21-09/20/21    Progress Note Due on Visit 50    PT Start Time 0930    PT Stop Time 1014    PT Time Calculation (min) 44 min    Equipment Utilized During Treatment Gait belt    Activity Tolerance Patient tolerated treatment well;No increased pain    Behavior During Therapy [facility] for tasks assessed/performed                Past Medical History:  Diagnosis Date   Anxiety    Arteriosclerosis of coronary artery 11/16/2011   Overview:  Stent 10/2011 stent rca 2015 with collaterals to lad which is chronically occluded    Benign enlargement of prostate    Benign essential HTN 06/11/2014   Benign prostatic hypertrophy without urinary obstruction 07/31/2014   Bilateral cataracts 05/16/2013   Overview:  Dr. [facility]     Bone spur of foot    Left   BP (high blood pressure) 11/09/2012   Cancer ([place])    skin (forehead) and bladder   Carotid artery narrowing 02/08/2014   Depression    Detrusor hypertrophy    Diabetes ([place])    Diabetes mellitus, type 2 ([place]) 12/12/2012   Diverticulosis    Dyspnea    Esophageal reflux    Esophageal reflux    Fothergill's neuralgia 08/07/2012   Overview:  [facility]    Gastritis    GERD (gastroesophageal reflux disease)    Headache    cluster headaches   Healed myocardial infarct 11/09/2012   Hearing loss in left ear    Heart disease    Hematuria    Hemorrhoids    History of hiatal hernia 12/14/2017   small    Hypercholesteremia    Lesion of bladder    Myocardial infarct (HCC)    Presence of stent in coronary artery 11/09/2012   Rectal  bleeding    Trigeminal neuralgia    Trigeminal neuralgia    Valvular heart disease    Vitamin D deficiency    Past Surgical History:  Procedure Laterality Date   APPENDECTOMY     BOTOX INJECTION N/A 12/21/2017   Procedure: Bladder BOTOX INJECTION;  Surgeon: [name], MD;  Location: [facility] ORS;  Service: Urology;  Laterality: N/A;   BOTOX INJECTION N/A 09/11/2018   Procedure: Bladder BOTOX INJECTION;  Surgeon: [name], MD;  Location: [facility];  Service: Urology;  Laterality: N/A;   CARDIAC CATHETERIZATION     CARDIAC CATHETERIZATION N/A 01/22/2015   Procedure: Left Heart Cath;  Surgeon: Corey Skains, MD;  Location: [place] [facility];  Service: Cardiovascular;  Laterality: N/A;   CARDIAC CATHETERIZATION N/A 01/22/2015   Procedure: Coronary Stent Intervention;  Surgeon: Isaias Cowman, MD;  Location: [place] [facility];  Service: Cardiovascular;  Laterality: N/A;   CATARACT EXTRACTION, BILATERAL     COLONOSCOPY WITH PROPOFOL N/A 12/08/2015   Procedure: COLONOSCOPY WITH PROPOFOL;  Surgeon: Lollie Sails, MD;  Location: [facility] ENDOSCOPY;  Service: Endoscopy;  Laterality: N/A;   COLONOSCOPY WITH PROPOFOL N/A 12/09/2015  Procedure: COLONOSCOPY WITH PROPOFOL;  Surgeon: Lollie Sails, MD;  Location: [facility] ENDOSCOPY;  Service: Endoscopy;  Laterality: N/A;   CORONARY ANGIOPLASTY     5 stents   CORONARY STENT PLACEMENT  2015   x5   CYSTOSCOPY N/A 09/11/2018   Procedure: CYSTOSCOPY;  Surgeon: [name], MD;  Location: [facility] ORS;  Service: Urology;  Laterality: N/A;   CYSTOSCOPY WITH BIOPSY N/A 12/21/2017   Procedure: CYSTOSCOPY WITH BIOPSY;  Surgeon: [name], MD;  Location: [facility] OR[facility];  Service: Urology;  Laterality: N/A;   ESOPHAGOGASTRODUODENOSCOPY (EGD) WITH PROPOFOL N/A 12/08/2015   Procedure: ESOPHAGOGASTRODUODENOSCOPY (EGD) WITH PROPOFOL;  Surgeon: Lollie Sails, MD;  Location: [facility] ENDOSCOPY;  Service: Endoscopy;  Laterality: N/A;    ESOPHAGOGASTRODUODENOSCOPY (EGD) WITH PROPOFOL N/A 12/13/2017   Procedure: ESOPHAGOGASTRODUODENOSCOPY (EGD) WITH PROPOFOL;  Surgeon: Lollie Sails, MD;  Location: [facility] ENDOSCOPY;  Service: Endoscopy;  Laterality: N/A;   EYE SURGERY     HERNIA REPAIR     kidney tumor remove     TRANSURETHRAL RESECTION OF BLADDER TUMOR WITH GYRUS (TURBT-GYRUS)  30/0762   UMBILICAL HERNIA REPAIR     urethral meatotomy     Patient Active Problem List   Diagnosis Date Noted   Class 2 obesity due to excess calories with body mass index (BMI) of 36.0 to 36.9 in adult 04/11/2020   Swelling of limb 03/28/2020   Lymphedema 03/28/2020   Trigeminal neuralgia 12/25/2019   Lower limb ulcer, calf, left, limited to breakdown of skin ([place]) 12/25/2019   OSA (obstructive sleep apnea) 26/33/3545   Acute systolic CHF (congestive heart failure) ([place]) 12/12/2018   Bruising 07/10/2018   Diet-controlled type 2 diabetes mellitus ([place]) 03/24/2018   Microalbuminuria 03/24/2018   Dizziness 11/17/2017   Simple chronic bronchitis ([place]) 09/22/2017   SOBOE (shortness of breath on exertion) 02/18/2017   Chronic midline low back pain without sciatica 12/29/2016   Periodic limb movement disorder 12/29/2016   Benign essential tremor 06/22/2016   Pure hypercholesterolemia 06/20/2015   Erectile dysfunction due to arterial insufficiency 06/16/2015   Unstable angina ([place]) 01/22/2015   Abdominal aortic 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 rupture ([place]) 12/31/2014   Benign prostatic hypertrophy without urinary obstruction 07/31/2014   Enlarged prostate 07/31/2014   Benign essential HTN 06/11/2014   Carotid artery narrowing 02/08/2014   Carotid artery obstruction 02/08/2014   Bilateral carotid artery stenosis 02/08/2014   Chest pain 08/20/2013   Bilateral cataracts 05/16/2013   Cataract 05/16/2013   Clinical depression 12/12/2012   Diabetes mellitus, type 2 ([place]) 12/12/2012   Combined fat and carbohydrate induced hyperlipemia 12/12/2012    Major depressive disorder, single episode, unspecified 12/12/2012   Acid reflux 11/09/2012   Presence of stent in coronary artery 11/09/2012   BP (high blood pressure) 11/09/2012   Healed myocardial infarct 11/09/2012   Gastro-esophageal reflux disease without esophagitis 11/09/2012   History of cardiovascular surgery 11/09/2012   Fothergill's neuralgia 08/07/2012   Swelling of testicle 04/06/2012   Disorder of male genital organ 04/06/2012   Swelling of the testicles 04/06/2012   Arteriosclerosis of coronary artery 11/16/2011   CAD in native artery 11/16/2011   Fatigue 06/04/2011   Avitaminosis D 11/30/2010    REFERRING DIAG: Repeated falls   THERAPY DIAG:   Difficulty in walking, not elsewhere classified  Muscle weakness (generalized)  Abnormality of gait and mobility  Other abnormalities of gait and mobility  Other lack of coordination  Unsteadiness on feet  Chronic bilateral low back pain without sciatica  Rationale for Evaluation  and Treatment Rehabilitation   Subjective Assessment - 09/22/21 0936     Subjective Patient reports having a good day. States He was able to walk in [place] [employer] store with wife using his upright walker and able to walk well including walking throughout store.    Patient is accompained by: Family member -Wife   Pertinent History Patient is a 76 year old male with referral for Physical Therapy with diagnosis of Parkinsons disease and history of falling. He reports he diagnosed last year with Parkinsons and has improved his tremors with use of medication but reports worsening shuffling and difficulty with mobility. Patient has past medical history signifiant for Parkinsons; Arthritis, bladder cancer, Trigemic Neuralgia. He lives with wife in 1 level home and current using cane with reported recent fall.    Limitations Walking;Lifting;Standing;House hold activities    How long can you sit comfortably? No limits    How long can you stand  comfortably? 15 min- limited due to back pain and foot numbess    How long can you walk comfortably? about 80 feet    Patient Stated Goals Improve my balance, be able to get up off the floor so I can maybe work in my garage again.    Currently in Pain? No/denies               PRECAUTIONS: Falls   PAIN:  None    TODAY'S TREATMENT:  INTERVAL Nustep training: LE only with VC to keep SPM > 50 spm  L1x 1 min L3 x 1 min L5 x 2 min L3 x 1 min L1 x 1 min   - Sit to stand from elevated to lower surface - 24 in  1x8 - all without UE support - Sit to stand from elevated to lower surface - 23 in 1x6 - all without UE support - Sit to stand from elevated to lower surface - 22 in 1x5 - all without UE support - Sit to stand from elevated to lower surface - 21 in 1x8 - all without UE support   -TUG= 21.45 sec with using upright 4WW  -10 Meter walk= 16.08 sec and 14.98 sec = 15.53 sec avg= 0.64 m/s  -6 min Walk= 740 feet in 6 min using upright 4WW   Education provided throughout session via VC/TC and demonstration to facilitate movement at target joints and correct muscle activation for all testing and exercises performed.     PATIENT EDUCATION: Education details: Exercise technique, instruction in use of AD (see note for details) Person educated: Patient Education method: Explanation, Demonstration, Tactile cues, and Verbal cues Education comprehension: verbalized understanding, returned demonstration, verbal cues required, tactile cues required, and needs further education   HOME EXERCISE PROGRAM: No updates today   PT Short Term Goals -       PT SHORT TERM GOAL #1   Title Pt will be independent with INITIAL  HEP in order to improve strength and balance in order to decrease fall risk and improve function at home and work.    Baseline 04/07/2021= No formal HEP in place. 05/11/2021= Patient reports doing pretty good- compliant with walking program and his  stretching/strengthening.    Time 6    Period Weeks    Status Achieved    Target Date 05/19/21      PT SHORT TERM GOAL #2   Title Pt will decrease 5TSTS by at least 3 seconds in order to demonstrate clinically significant improvement in LE strength.  Baseline 04/07/2021= 25 sec; 05/11/2021= 18.65 sec    Time 6    Period Weeks    Status Achieved    Target Date 06/30/21              PT Long Term Goals -       PT LONG TERM GOAL #1   Title Pt will be independent with FINAL HEP in order to improve strength and balance in order to decrease fall risk and improve function at home and work.    Baseline 04/07/2021= No formal HEP in place. 07/01/2021=Patient reports performing walking and some standing LE strengthening exercises as instructed and states no questions at this time.  09/02/21: Pt reports he feels indep with exercises but is not performing HEP as much as he should   Time 12    Period Weeks    Status Partially Met    Target Date 09/23/21      PT LONG TERM GOAL #2   Title Pt will improve FOTO to target score of 45% to display perceived improvements in ability to complete ADL's.    Baseline 2/7/22023= 41%; FOTO=53%    Time 12    Period Weeks    Status Achieved    Target Date 06/30/21      PT LONG TERM GOAL #3   Title Pt will decrease 5TSTS by at least 5 seconds in order to demonstrate clinically significant improvement in LE strength.    Baseline 04/07/2021= 25 sec; 05/11/2021= 18.65 sec without UE support; 4/10= 13.76 sec without UE support    Time 12    Period Weeks    Status Achieved    Target Date 06/30/21      PT LONG TERM GOAL #4   Title Pt will decrease TUG to below 17 seconds/decrease in order to demonstrate decreased fall risk.    Baseline 04/07/2021= 21 sec with SPC; 05/11/2021= 18.85 sec with use of cane; 07/21/2021=18.88 sec  avg with use of cane; 7/5: 22.85 sec with QC; 09/29/2021= 21.45 sec using upright walker   Time 12    Period Weeks    Status Partially Met     Target Date 12/15/2021     PT LONG TERM GOAL #5   Title Pt will increase 10MWT by at least 0.15 m/s in order to demonstrate clinically significant improvement in community ambulation.    Baseline 04/07/2021= 0.56 m/s using SPC; 05/11/2021= 0.54 m/s using SPC; 07/01/2021= 0.6 m/s/ 7/5: 0.48 m/s without AD but with CGA; 09/29/2021= 0.64 m/s   Time 12    Period Weeks    Status On-going    Target Date 12/15/2021     PT LONG TERM GOAL #6   Title Pt will increase 6MWT by at least 56m(1628f in order to demonstrate clinically significant improvement in cardiopulmonary endurance and community ambulation    Baseline 06/08/2021= 600 ft. in 5 min- Stopped due to fatigue using 4WW; 07/01/2021= 665 in 5 min 45 sec using 4WW; 7/5: 367 ft with QC; 09/29/2021= 740 feet using upright 4WW   Time 4    Period Weeks    Status On-going    Target Date 12/15/2021             Plan - 08/20/21 0939     Clinical Impression Statement Patient presents with good motivation. He is demo improved safety with mobility with use of upright 4WW and able to maintain more erect posture and good step length. He has significant improvement in 6 min walk  test and 10 MWT indicating improved gait speed placing him at decreased risk of falling.  The patient will continue to benefit from skilled PT services to address deficits in gait, strength, endurance and balance to decrease risk for future falls.    Personal Factors and Comorbidities Comorbidity 3+    Comorbidities arthritis, bladder cancer, Trigeminal Neuralgia    Examination-Activity Limitations Caring for Others;Carry;Continence;Lift;Squat;Stairs;Stand    Examination-Participation Restrictions Community Activity;Yard Work    [employer] Evolving/Moderate complexity    Rehab Potential Good    PT Frequency 2x / week    PT Duration 12 weeks    PT Treatment/Interventions ADLs/Self Care Home Management;Cryotherapy;Canalith Repostioning;Electrical  Stimulation;Moist Heat;DME Instruction;Gait training;Stair training;Functional mobility training;Therapeutic activities;Therapeutic exercise;Balance training;Neuromuscular re-education;Patient/family education;Manual techniques;Passive range of motion;Dry needling;Vestibular    PT Next Visit Plan LE strength, improve step length with ambulation, muscle tissue lengthening    PT Home Exercise Plan Access Code: [email or web address]            10:48 AM, 09/29/21    Lewis Moccasin, PT Physical Therapist - [place] [phone]

## 2021-09-29 NOTE — Addendum Note (Signed)
Addended by: Lewis Moccasin on: 09/29/2021 10:39 AM   Modules accepted: Orders

## 2021-09-30 ENCOUNTER — Ambulatory Visit (INDEPENDENT_AMBULATORY_CARE_PROVIDER_SITE_OTHER): Payer: Medicare HMO | Admitting: Urology

## 2021-09-30 ENCOUNTER — Encounter: Payer: Self-pay | Admitting: Urology

## 2021-09-30 VITALS — BP 92/54 | HR 71 | Ht 67.0 in | Wt 226.0 lb

## 2021-09-30 DIAGNOSIS — N3281 Overactive bladder: Secondary | ICD-10-CM

## 2021-09-30 DIAGNOSIS — Z8551 Personal history of malignant neoplasm of bladder: Secondary | ICD-10-CM

## 2021-09-30 LAB — URINALYSIS, COMPLETE
Bilirubin, UA: NEGATIVE
Glucose, UA: NEGATIVE
Ketones, UA: NEGATIVE
Leukocytes,UA: NEGATIVE
Nitrite, UA: NEGATIVE
Protein,UA: NEGATIVE
RBC, UA: NEGATIVE
Specific Gravity, UA: 1.01 (ref 1.005–1.030)
Urobilinogen, Ur: 0.2 mg/dL (ref 0.2–1.0)
pH, UA: 5.5 (ref 5.0–7.5)

## 2021-09-30 LAB — MICROSCOPIC EXAMINATION: Bacteria, UA: NONE SEEN

## 2021-09-30 NOTE — Progress Notes (Signed)
   09/30/21  CC:  Chief Complaint  Patient presents with   Cysto     HPI: [name] is a 76 y.o. male with a hx of bladder cancer returns today for a surveillance cysto.    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   He has failed Botox, multiple oral agents as well as stage I InterStim.  Mild improvement with PTNS.  Currently back on oxybutynin with minimal mprovement, managed by Dr. Matilde Sprang.   Vitals:   09/30/21 1402  BP: (!) 92/54  Pulse: 71   NED. A&Ox3.   No respiratory distress   Abd soft, NT, ND Normal phallus with bilateral descended testicles  Cystoscopy Procedure Note  Patient identification was confirmed, informed consent was obtained, and patient was prepped using Betadine solution.  Lidocaine jelly was administered per urethral meatus.     Pre-Procedure: - Inspection reveals a normal caliber ureteral meatus.  Procedure: The flexible cystoscope was introduced without difficulty - No urethral strictures/lesions are present. - Normal prostate  - Normal bladder neck - Bilateral ureteral orifices identified - Bladder mucosa  reveals no ulcers, tumors, or lesions - No bladder stones - No trabeculation  Retroflexion shows NED   Post-Procedure: - Patient tolerated the procedure well   Assessment/ Plan: History of bladder cancer  - NED - Will continue annual cystoscopy    2. OAB (overactive bladder) - Managed by Dr Matilde Sprang  as needed  F/u 1 year cysto  Conley Rolls as a scribe for [name], MD.,have documented all relevant documentation on the behalf of [name], MD,as directed by  [name], MD while in the presence of [name], MD.  I have reviewed the above documentation for accuracy and completeness, and I agree with the above.   [name], MD

## 2021-10-01 ENCOUNTER — Ambulatory Visit: Payer: Medicare HMO

## 2021-10-01 DIAGNOSIS — R2681 Unsteadiness on feet: Secondary | ICD-10-CM

## 2021-10-01 DIAGNOSIS — R269 Unspecified abnormalities of gait and mobility: Secondary | ICD-10-CM

## 2021-10-01 DIAGNOSIS — R2689 Other abnormalities of gait and mobility: Secondary | ICD-10-CM

## 2021-10-01 DIAGNOSIS — R262 Difficulty in walking, not elsewhere classified: Secondary | ICD-10-CM | POA: Diagnosis not present

## 2021-10-01 DIAGNOSIS — R278 Other lack of coordination: Secondary | ICD-10-CM

## 2021-10-01 DIAGNOSIS — M545 Low back pain, unspecified: Secondary | ICD-10-CM

## 2021-10-01 DIAGNOSIS — M6281 Muscle weakness (generalized): Secondary | ICD-10-CM

## 2021-10-01 NOTE — Therapy (Signed)
OUTPATIENT PHYSICAL THERAPY TREATMENT NOTE    Patient Name: [name] MRN: [id number] DOB:02/17/1946, 76 y.o., male Today's Date: 10/01/2021  PCP: [name]. [name], [place] PROVIDER: Dr. Gurney Maxin   PT End of Session - 10/01/21 0936     Visit Number 48    Number of Visits 70    Date for PT Re-Evaluation 12/15/21    Authorization Type [employer]    Authorization Time Period 07/01/21-09/20/21    Progress Note Due on Visit 50    PT Start Time 0930    PT Stop Time 1014    PT Time Calculation (min) 44 min    Equipment Utilized During Treatment Gait belt    Activity Tolerance Patient tolerated treatment well;No increased pain    Behavior During Therapy [facility] for tasks assessed/performed                Past Medical History:  Diagnosis Date   Anxiety    Arteriosclerosis of coronary artery 11/16/2011   Overview:  Stent 10/2011 stent rca 2015 with collaterals to lad which is chronically occluded    Benign enlargement of prostate    Benign essential HTN 06/11/2014   Benign prostatic hypertrophy without urinary obstruction 07/31/2014   Bilateral cataracts 05/16/2013   Overview:  Dr. [facility]     Bone spur of foot    Left   BP (high blood pressure) 11/09/2012   Cancer ([place])    skin (forehead) and bladder   Carotid artery narrowing 02/08/2014   Depression    Detrusor hypertrophy    Diabetes ([place])    Diabetes mellitus, type 2 ([place]) 12/12/2012   Diverticulosis    Dyspnea    Esophageal reflux    Esophageal reflux    Fothergill's neuralgia 08/07/2012   Overview:  [facility]    Gastritis    GERD (gastroesophageal reflux disease)    Headache    cluster headaches   Healed myocardial infarct 11/09/2012   Hearing loss in left ear    Heart disease    Hematuria    Hemorrhoids    History of hiatal hernia 12/14/2017   small    Hypercholesteremia    Lesion of bladder    Myocardial infarct (HCC)    Presence of stent in coronary artery 11/09/2012   Rectal  bleeding    Trigeminal neuralgia    Trigeminal neuralgia    Valvular heart disease    Vitamin D deficiency    Past Surgical History:  Procedure Laterality Date   APPENDECTOMY     BOTOX INJECTION N/A 12/21/2017   Procedure: Bladder BOTOX INJECTION;  Surgeon: [name], MD;  Location: [facility] ORS;  Service: Urology;  Laterality: N/A;   BOTOX INJECTION N/A 09/11/2018   Procedure: Bladder BOTOX INJECTION;  Surgeon: [name], MD;  Location: [facility] ORS;  Service: Urology;  Laterality: N/A;   CARDIAC CATHETERIZATION     CARDIAC CATHETERIZATION N/A 01/22/2015   Procedure: Left Heart Cath;  Surgeon: Corey Skains, MD;  Location: [place] [facility];  Service: Cardiovascular;  Laterality: N/A;   CARDIAC CATHETERIZATION N/A 01/22/2015   Procedure: Coronary Stent Intervention;  Surgeon: Isaias Cowman, MD;  Location: [place] [facility];  Service: Cardiovascular;  Laterality: N/A;   CATARACT EXTRACTION, BILATERAL     COLONOSCOPY WITH PROPOFOL N/A 12/08/2015   Procedure: COLONOSCOPY WITH PROPOFOL;  Surgeon: Lollie Sails, MD;  Location: [facility] ENDOSCOPY;  Service: Endoscopy;  Laterality: N/A;   COLONOSCOPY WITH PROPOFOL N/A 12/09/2015  Procedure: COLONOSCOPY WITH PROPOFOL;  Surgeon: Lollie Sails, MD;  Location: [facility] ENDOSCOPY;  Service: Endoscopy;  Laterality: N/A;   CORONARY ANGIOPLASTY     5 stents   CORONARY STENT PLACEMENT  2015   x5   CYSTOSCOPY N/A 09/11/2018   Procedure: CYSTOSCOPY;  Surgeon: [name], MD;  Location: [facility] ORS;  Service: Urology;  Laterality: N/A;   CYSTOSCOPY WITH BIOPSY N/A 12/21/2017   Procedure: CYSTOSCOPY WITH BIOPSY;  Surgeon: [name], MD;  Location: [facility] ORS;  Service: Urology;  Laterality: N/A;   ESOPHAGOGASTRODUODENOSCOPY (EGD) WITH PROPOFOL N/A 12/08/2015   Procedure: ESOPHAGOGASTRODUODENOSCOPY (EGD) WITH PROPOFOL;  Surgeon: Lollie Sails, MD;  Location: [facility] ENDOSCOPY;  Service: Endoscopy;  Laterality: N/A;    ESOPHAGOGASTRODUODENOSCOPY (EGD) WITH PROPOFOL N/A 12/13/2017   Procedure: ESOPHAGOGASTRODUODENOSCOPY (EGD) WITH PROPOFOL;  Surgeon: Lollie Sails, MD;  Location: [facility] ENDOSCOPY;  Service: Endoscopy;  Laterality: N/A;   EYE SURGERY     HERNIA REPAIR     kidney tumor remove     TRANSURETHRAL RESECTION OF BLADDER TUMOR WITH GYRUS (TURBT-GYRUS)  42/6834   UMBILICAL HERNIA REPAIR     urethral meatotomy     Patient Active Problem List   Diagnosis Date Noted   Class 2 obesity due to excess calories with body mass index (BMI) of 36.0 to 36.9 in adult 04/11/2020   Swelling of limb 03/28/2020   Lymphedema 03/28/2020   Trigeminal neuralgia 12/25/2019   Lower limb ulcer, calf, left, limited to breakdown of skin ([place]) 12/25/2019   OSA (obstructive sleep apnea) 19/62/2297   Acute systolic CHF (congestive heart failure) ([place]) 12/12/2018   Bruising 07/10/2018   Diet-controlled type 2 diabetes mellitus ([place]) 03/24/2018   Microalbuminuria 03/24/2018   Dizziness 11/17/2017   Simple chronic bronchitis ([place]) 09/22/2017   SOBOE (shortness of breath on exertion) 02/18/2017   Chronic midline low back pain without sciatica 12/29/2016   Periodic limb movement disorder 12/29/2016   Benign essential tremor 06/22/2016   Pure hypercholesterolemia 06/20/2015   Erectile dysfunction due to arterial insufficiency 06/16/2015   Unstable angina ([place]) 01/22/2015   Abdominal aortic 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 rupture ([place]) 12/31/2014   Benign prostatic hypertrophy without urinary obstruction 07/31/2014   Enlarged prostate 07/31/2014   Benign essential HTN 06/11/2014   Carotid artery narrowing 02/08/2014   Carotid artery obstruction 02/08/2014   Bilateral carotid artery stenosis 02/08/2014   Chest pain 08/20/2013   Bilateral cataracts 05/16/2013   Cataract 05/16/2013   Clinical depression 12/12/2012   Diabetes mellitus, type 2 ([place]) 12/12/2012   Combined fat and carbohydrate induced hyperlipemia 12/12/2012    Major depressive disorder, single episode, unspecified 12/12/2012   Acid reflux 11/09/2012   Presence of stent in coronary artery 11/09/2012   BP (high blood pressure) 11/09/2012   Healed myocardial infarct 11/09/2012   Gastro-esophageal reflux disease without esophagitis 11/09/2012   History of cardiovascular surgery 11/09/2012   Fothergill's neuralgia 08/07/2012   Swelling of testicle 04/06/2012   Disorder of male genital organ 04/06/2012   Swelling of the testicles 04/06/2012   Arteriosclerosis of coronary artery 11/16/2011   CAD in native artery 11/16/2011   Fatigue 06/04/2011   Avitaminosis D 11/30/2010    REFERRING DIAG: Repeated falls   THERAPY DIAG:   Difficulty in walking, not elsewhere classified  Muscle weakness (generalized)  Abnormality of gait and mobility  Other abnormalities of gait and mobility  Other lack of coordination  Unsteadiness on feet  Chronic bilateral low back pain without sciatica  Rationale for Evaluation  and Treatment Rehabilitation   Subjective Assessment - 09/22/21 0936     Subjective Patient reports feeling okay today but states near fall this morning- Did not reach back for stool    Patient is accompained by: Family member -Wife   Pertinent History Patient is a 76 year old male with referral for Physical Therapy with diagnosis of Parkinsons disease and history of falling. He reports he diagnosed last year with Parkinsons and has improved his tremors with use of medication but reports worsening shuffling and difficulty with mobility. Patient has past medical history signifiant for Parkinsons; Arthritis, bladder cancer, Trigemic Neuralgia. He lives with wife in 1 level home and current using cane with reported recent fall.    Limitations Walking;Lifting;Standing;House hold activities    How long can you sit comfortably? No limits    How long can you stand comfortably? 15 min- limited due to back pain and foot numbess    How long can you  walk comfortably? about 80 feet    Patient Stated Goals Improve my balance, be able to get up off the floor so I can maybe work in my garage again.    Currently in Pain? No/denies               PRECAUTIONS: Falls   PAIN:  None    TODAY'S TREATMENT:    INTERVAL Nustep training: LE only with VC to keep SPM > 50 spm  L1x 1 min L4 x 2 min L1 x 2 min L4 x 2 min L1 x 1 min  Sit to stand with arms outstretched holding onto ball x 15 reps Forward steps over 4 objects (1/2 foam, pool noodles) in // bars x 10 trips down and back Retro gait with 1 UE support x 10 feet x 6 2 retro steps back to chair then perform stand to sit x 6 (to simulate safe transfers) 360 deg turning- (inside of hoolahoop positioned on floor)     Education provided throughout session via VC/TC and demonstration to facilitate movement at target joints and correct muscle activation for all testing and exercises performed.     PATIENT EDUCATION: Education details: Exercise technique, instruction in use of AD (see note for details) Person educated: Patient Education method: Explanation, Demonstration, Tactile cues, and Verbal cues Education comprehension: verbalized understanding, returned demonstration, verbal cues required, tactile cues required, and needs further education   HOME EXERCISE PROGRAM: No updates today   PT Short Term Goals -       PT SHORT TERM GOAL #1   Title Pt will be independent with INITIAL  HEP in order to improve strength and balance in order to decrease fall risk and improve function at home and work.    Baseline 04/07/2021= No formal HEP in place. 05/11/2021= Patient reports doing pretty good- compliant with walking program and his stretching/strengthening.    Time 6    Period Weeks    Status Achieved    Target Date 05/19/21      PT SHORT TERM GOAL #2   Title Pt will decrease 5TSTS by at least 3 seconds in order to demonstrate clinically significant improvement in LE  strength.    Baseline 04/07/2021= 25 sec; 05/11/2021= 18.65 sec    Time 6    Period Weeks    Status Achieved    Target Date 06/30/21              PT Long Term Goals -       PT LONG  TERM GOAL #1   Title Pt will be independent with FINAL HEP in order to improve strength and balance in order to decrease fall risk and improve function at home and work.    Baseline 04/07/2021= No formal HEP in place. 07/01/2021=Patient reports performing walking and some standing LE strengthening exercises as instructed and states no questions at this time.  09/02/21: Pt reports he feels indep with exercises but is not performing HEP as much as he should   Time 12    Period Weeks    Status Partially Met    Target Date 09/23/21      PT LONG TERM GOAL #2   Title Pt will improve FOTO to target score of 45% to display perceived improvements in ability to complete ADL's.    Baseline 2/7/22023= 41%; FOTO=53%    Time 12    Period Weeks    Status Achieved    Target Date 06/30/21      PT LONG TERM GOAL #3   Title Pt will decrease 5TSTS by at least 5 seconds in order to demonstrate clinically significant improvement in LE strength.    Baseline 04/07/2021= 25 sec; 05/11/2021= 18.65 sec without UE support; 4/10= 13.76 sec without UE support    Time 12    Period Weeks    Status Achieved    Target Date 06/30/21      PT LONG TERM GOAL #4   Title Pt will decrease TUG to below 17 seconds/decrease in order to demonstrate decreased fall risk.    Baseline 04/07/2021= 21 sec with SPC; 05/11/2021= 18.85 sec with use of cane; 07/21/2021=18.88 sec  avg with use of cane; 7/5: 22.85 sec with QC; 09/29/2021= 21.45 sec using upright walker   Time 12    Period Weeks    Status Partially Met    Target Date 12/15/2021     PT LONG TERM GOAL #5   Title Pt will increase 10MWT by at least 0.15 m/s in order to demonstrate clinically significant improvement in community ambulation.    Baseline 04/07/2021= 0.56 m/s using SPC; 05/11/2021= 0.54 m/s  using SPC; 07/01/2021= 0.6 m/s/ 7/5: 0.48 m/s without AD but with CGA; 09/29/2021= 0.64 m/s   Time 12    Period Weeks    Status On-going    Target Date 12/15/2021     PT LONG TERM GOAL #6   Title Pt will increase 6MWT by at least 14m(1658f in order to demonstrate clinically significant improvement in cardiopulmonary endurance and community ambulation    Baseline 06/08/2021= 600 ft. in 5 min- Stopped due to fatigue using 4WW; 07/01/2021= 665 in 5 min 45 sec using 4WW; 7/5: 367 ft with QC; 09/29/2021= 740 feet using upright 4WW   Time 4    Period Weeks    Status On-going    Target Date 12/15/2021             Plan - 08/20/21 0939     Clinical Impression Statement Continued with plan for ROM/strengthening and balance. Patient was able to negotiate obstacles well today without significant difficulty. He was able to demo improved safety with backing up and using UE to reach back for safe stand to sit transfers. The patient will continue to benefit from skilled PT services to address deficits in gait, strength, endurance and balance to decrease risk for future falls.    Personal Factors and Comorbidities Comorbidity 3+    Comorbidities arthritis, bladder cancer, Trigeminal Neuralgia    Examination-Activity Limitations Caring for  Others;Carry;Continence;Lift;Squat;Stairs;Stand    Examination-Participation Restrictions Community Activity;Yard Work    [employer] Evolving/Moderate complexity    Rehab Potential Good    PT Frequency 2x / week    PT Duration 12 weeks    PT Treatment/Interventions ADLs/Self Care Home Management;Cryotherapy;Canalith Repostioning;Electrical Stimulation;Moist Heat;DME Instruction;Gait training;Stair training;Functional mobility training;Therapeutic activities;Therapeutic exercise;Balance training;Neuromuscular re-education;Patient/family education;Manual techniques;Passive range of motion;Dry needling;Vestibular    PT Next Visit Plan LE strength,  improve step length with ambulation, muscle tissue lengthening    PT Home Exercise Plan Access Code: [email or web address]            10:24 AM, 10/01/21    Lewis Moccasin, PT Physical Therapist - [place] [phone]

## 2021-10-06 ENCOUNTER — Ambulatory Visit: Payer: Medicare HMO

## 2021-10-06 DIAGNOSIS — R278 Other lack of coordination: Secondary | ICD-10-CM

## 2021-10-06 DIAGNOSIS — R2681 Unsteadiness on feet: Secondary | ICD-10-CM

## 2021-10-06 DIAGNOSIS — R2689 Other abnormalities of gait and mobility: Secondary | ICD-10-CM

## 2021-10-06 DIAGNOSIS — R262 Difficulty in walking, not elsewhere classified: Secondary | ICD-10-CM | POA: Diagnosis not present

## 2021-10-06 DIAGNOSIS — M6281 Muscle weakness (generalized): Secondary | ICD-10-CM

## 2021-10-06 DIAGNOSIS — R269 Unspecified abnormalities of gait and mobility: Secondary | ICD-10-CM

## 2021-10-06 DIAGNOSIS — G8929 Other chronic pain: Secondary | ICD-10-CM

## 2021-10-06 NOTE — Therapy (Signed)
OUTPATIENT PHYSICAL THERAPY TREATMENT NOTE    Patient Name: [name] MRN: [id number] DOB:07-19-1945, 76 y.o., male Today's Date: 10/06/2021  PCP: [name]. [name], [name] PROVIDER: Dr. Gurney Maxin   PT End of Session - 10/06/21 0935     Visit Number 42    Number of Visits 60    Date for PT Re-Evaluation 12/15/21    Authorization Type [employer]    Authorization Time Period 07/01/21-09/20/21    Progress Note Due on Visit 50    PT Start Time 0930    PT Stop Time 1012    PT Time Calculation (min) 42 min    Equipment Utilized During Treatment Gait belt    Activity Tolerance Patient tolerated treatment well;No increased pain    Behavior During Therapy [facility] for tasks assessed/performed                 Past Medical History:  Diagnosis Date   Anxiety    Arteriosclerosis of coronary artery 11/16/2011   Overview:  Stent 10/2011 stent rca 2015 with collaterals to lad which is chronically occluded    Benign enlargement of prostate    Benign essential HTN 06/11/2014   Benign prostatic hypertrophy without urinary obstruction 07/31/2014   Bilateral cataracts 05/16/2013   Overview:  Dr. [facility]     Bone spur of foot    Left   BP (high blood pressure) 11/09/2012   Cancer ([place])    skin (forehead) and bladder   Carotid artery narrowing 02/08/2014   Depression    Detrusor hypertrophy    Diabetes ([place])    Diabetes mellitus, type 2 ([place]) 12/12/2012   Diverticulosis    Dyspnea    Esophageal reflux    Esophageal reflux    Fothergill's neuralgia 08/07/2012   Overview:  [facility]    Gastritis    GERD (gastroesophageal reflux disease)    Headache    cluster headaches   Healed myocardial infarct 11/09/2012   Hearing loss in left ear    Heart disease    Hematuria    Hemorrhoids    History of hiatal hernia 12/14/2017   small    Hypercholesteremia    Lesion of bladder    Myocardial infarct (HCC)    Presence of stent in coronary artery 11/09/2012    Rectal bleeding    Trigeminal neuralgia    Trigeminal neuralgia    Valvular heart disease    Vitamin D deficiency    Past Surgical History:  Procedure Laterality Date   APPENDECTOMY     BOTOX INJECTION N/A 12/21/2017   Procedure: Bladder BOTOX INJECTION;  Surgeon: [name], MD;  Location: [facility] ORS;  Service: Urology;  Laterality: N/A;   BOTOX INJECTION N/A 09/11/2018   Procedure: Bladder BOTOX INJECTION;  Surgeon: [name], MD;  Location: [facility] ORS;  Service: Urology;  Laterality: N/A;   CARDIAC CATHETERIZATION     CARDIAC CATHETERIZATION N/A 01/22/2015   Procedure: Left Heart Cath;  Surgeon: Corey Skains, MD;  Location: [place] CV LAB;  Service: Cardiovascular;  Laterality: N/A;   CARDIAC CATHETERIZATION N/A 01/22/2015   Procedure: Coronary Stent Intervention;  Surgeon: Isaias Cowman, MD;  Location: [place] CV LAB;  Service: Cardiovascular;  Laterality: N/A;   CATARACT EXTRACTION, BILATERAL     COLONOSCOPY WITH PROPOFOL N/A 12/08/2015   Procedure: COLONOSCOPY WITH PROPOFOL;  Surgeon: Lollie Sails, MD;  Location: [facility] ENDOSCOPY;  Service: Endoscopy;  Laterality: N/A;   COLONOSCOPY WITH PROPOFOL N/A 12/09/2015  Procedure: COLONOSCOPY WITH PROPOFOL;  Surgeon: Lollie Sails, MD;  Location: [facility] ENDOSCOPY;  Service: Endoscopy;  Laterality: N/A;   CORONARY ANGIOPLASTY     5 stents   CORONARY STENT PLACEMENT  2015   x5   CYSTOSCOPY N/A 09/11/2018   Procedure: CYSTOSCOPY;  Surgeon: [name], MD;  Location: [facility] ORS;  Service: Urology;  Laterality: N/A;   CYSTOSCOPY WITH BIOPSY N/A 12/21/2017   Procedure: CYSTOSCOPY WITH BIOPSY;  Surgeon: [name], MD;  Location: [facility] ORS;  Service: Urology;  Laterality: N/A;   ESOPHAGOGASTRODUODENOSCOPY (EGD) WITH PROPOFOL N/A 12/08/2015   Procedure: ESOPHAGOGASTRODUODENOSCOPY (EGD) WITH PROPOFOL;  Surgeon: Lollie Sails, MD;  Location: [facility] ENDOSCOPY;  Service: Endoscopy;  Laterality: N/A;    ESOPHAGOGASTRODUODENOSCOPY (EGD) WITH PROPOFOL N/A 12/13/2017   Procedure: ESOPHAGOGASTRODUODENOSCOPY (EGD) WITH PROPOFOL;  Surgeon: Lollie Sails, MD;  Location: [facility] ENDOSCOPY;  Service: Endoscopy;  Laterality: N/A;   EYE SURGERY     HERNIA REPAIR     kidney tumor remove     TRANSURETHRAL RESECTION OF BLADDER TUMOR WITH GYRUS (TURBT-GYRUS)  96/2229   UMBILICAL HERNIA REPAIR     urethral meatotomy     Patient Active Problem List   Diagnosis Date Noted   Class 2 obesity due to excess calories with body mass index (BMI) of 36.0 to 36.9 in adult 04/11/2020   Swelling of limb 03/28/2020   Lymphedema 03/28/2020   Trigeminal neuralgia 12/25/2019   Lower limb ulcer, calf, left, limited to breakdown of skin ([place]) 12/25/2019   OSA (obstructive sleep apnea) 79/89/2119   Acute systolic CHF (congestive heart failure) ([place]) 12/12/2018   Bruising 07/10/2018   Diet-controlled type 2 diabetes mellitus ([place]) 03/24/2018   Microalbuminuria 03/24/2018   Dizziness 11/17/2017   Simple chronic bronchitis ([place]) 09/22/2017   SOBOE (shortness of breath on exertion) 02/18/2017   Chronic midline low back pain without sciatica 12/29/2016   Periodic limb movement disorder 12/29/2016   Benign essential tremor 06/22/2016   Pure hypercholesterolemia 06/20/2015   Erectile dysfunction due to arterial insufficiency 06/16/2015   Unstable angina ([place]) 01/22/2015   Abdominal aortic 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 rupture ([place]) 12/31/2014   Benign prostatic hypertrophy without urinary obstruction 07/31/2014   Enlarged prostate 07/31/2014   Benign essential HTN 06/11/2014   Carotid artery narrowing 02/08/2014   Carotid artery obstruction 02/08/2014   Bilateral carotid artery stenosis 02/08/2014   Chest pain 08/20/2013   Bilateral cataracts 05/16/2013   Cataract 05/16/2013   Clinical depression 12/12/2012   Diabetes mellitus, type 2 ([place]) 12/12/2012   Combined fat and carbohydrate induced hyperlipemia 12/12/2012    Major depressive disorder, single episode, unspecified 12/12/2012   Acid reflux 11/09/2012   Presence of stent in coronary artery 11/09/2012   BP (high blood pressure) 11/09/2012   Healed myocardial infarct 11/09/2012   Gastro-esophageal reflux disease without esophagitis 11/09/2012   History of cardiovascular surgery 11/09/2012   Fothergill's neuralgia 08/07/2012   Swelling of testicle 04/06/2012   Disorder of male genital organ 04/06/2012   Swelling of the testicles 04/06/2012   Arteriosclerosis of coronary artery 11/16/2011   CAD in native artery 11/16/2011   Fatigue 06/04/2011   Avitaminosis D 11/30/2010    REFERRING DIAG: Repeated falls   THERAPY DIAG:   Difficulty in walking, not elsewhere classified  Muscle weakness (generalized)  Abnormality of gait and mobility  Other abnormalities of gait and mobility  Other lack of coordination  Unsteadiness on feet  Chronic bilateral low back pain without sciatica  Rationale for Evaluation  and Treatment Rehabilitation   Subjective Assessment - 09/22/21 0936     Subjective Patient reports moving very slow today and not feeling great. States stiff and sore all over. 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   Patient is accompained by: Family member -Wife   Pertinent History Patient is a 76 year old male with referral for Physical Therapy with diagnosis of Parkinsons disease and history of falling. He reports he diagnosed last year with Parkinsons and has improved his tremors with use of medication but reports worsening shuffling and difficulty with mobility. Patient has past medical history signifiant for Parkinsons; Arthritis, bladder cancer, Trigemic Neuralgia. He lives with wife in 1 level home and current using cane with reported recent fall.    Limitations Walking;Lifting;Standing;House hold activities    How long can you sit comfortably? No limits    How long can you stand comfortably? 15 min- limited due to back pain and foot  numbess    How long can you walk comfortably? about 80 feet    Patient Stated Goals Improve my balance, be able to get up off the floor so I can maybe work in my garage again.    Currently in Pain? No/denies               PRECAUTIONS: Falls   PAIN:  None    TODAY'S TREATMENT:    INTERVAL Nustep training: LE only with VC to keep SPM > 50 spm  L1x 6 min= 0.14 mi with no report of any increased pain.  Sit to stand without UE support x 12 reps (patient fatigued yet no pain)  Seated hip march with 3lb. 2 sets of 12 reps (VC for increased height)  Seated ham curls with BTB  Seated knee ext with 3lb (VC for full ROM)  Seated hip flex/abd up and over 1/2 spike ball x 12 (patient reported soreness in left LE)  Ambulation- 80 feet with upright 4WW= 0.5 m/s Standing hip circles (CW/CCW) x 25 reps each Standing hip flex into ext- Hip swings- 25 reps each Standing calf raises with 3 sec hold x 15 reps Standing toe raises with 3 sec hold x 15 reps  Education provided throughout session via VC/TC and demonstration to facilitate movement at target joints and correct muscle activation for all testing and exercises performed.     PATIENT EDUCATION: Education details: Exercise technique, instruction in use of AD (see note for details) Person educated: Patient Education method: Explanation, Demonstration, Tactile cues, and Verbal cues Education comprehension: verbalized understanding, returned demonstration, verbal cues required, tactile cues required, and needs further education   HOME EXERCISE PROGRAM: No updates today   PT Short Term Goals -       PT SHORT TERM GOAL #1   Title Pt will be independent with INITIAL  HEP in order to improve strength and balance in order to decrease fall risk and improve function at home and work.    Baseline 04/07/2021= No formal HEP in place. 05/11/2021= Patient reports doing pretty good- compliant with walking program and his  stretching/strengthening.    Time 6    Period Weeks    Status Achieved    Target Date 05/19/21      PT SHORT TERM GOAL #2   Title Pt will decrease 5TSTS by at least 3 seconds in order to demonstrate clinically significant improvement in LE strength.    Baseline 04/07/2021= 25 sec; 05/11/2021= 18.65 sec    Time 6    Period Weeks  Status Achieved    Target Date 06/30/21              PT Long Term Goals -       PT LONG TERM GOAL #1   Title Pt will be independent with FINAL HEP in order to improve strength and balance in order to decrease fall risk and improve function at home and work.    Baseline 04/07/2021= No formal HEP in place. 07/01/2021=Patient reports performing walking and some standing LE strengthening exercises as instructed and states no questions at this time.  09/02/21: Pt reports he feels indep with exercises but is not performing HEP as much as he should   Time 12    Period Weeks    Status Partially Met    Target Date 09/23/21      PT LONG TERM GOAL #2   Title Pt will improve FOTO to target score of 45% to display perceived improvements in ability to complete ADL's.    Baseline 2/7/22023= 41%; FOTO=53%    Time 12    Period Weeks    Status Achieved    Target Date 06/30/21      PT LONG TERM GOAL #3   Title Pt will decrease 5TSTS by at least 5 seconds in order to demonstrate clinically significant improvement in LE strength.    Baseline 04/07/2021= 25 sec; 05/11/2021= 18.65 sec without UE support; 4/10= 13.76 sec without UE support    Time 12    Period Weeks    Status Achieved    Target Date 06/30/21      PT LONG TERM GOAL #4   Title Pt will decrease TUG to below 17 seconds/decrease in order to demonstrate decreased fall risk.    Baseline 04/07/2021= 21 sec with SPC; 05/11/2021= 18.85 sec with use of cane; 07/21/2021=18.88 sec  avg with use of cane; 7/5: 22.85 sec with QC; 09/29/2021= 21.45 sec using upright walker   Time 12    Period Weeks    Status Partially Met     Target Date 12/15/2021     PT LONG TERM GOAL #5   Title Pt will increase 10MWT by at least 0.15 m/s in order to demonstrate clinically significant improvement in community ambulation.    Baseline 04/07/2021= 0.56 m/s using SPC; 05/11/2021= 0.54 m/s using SPC; 07/01/2021= 0.6 m/s/ 7/5: 0.48 m/s without AD but with CGA; 09/29/2021= 0.64 m/s   Time 12    Period Weeks    Status On-going    Target Date 12/15/2021     PT LONG TERM GOAL #6   Title Pt will increase 6MWT by at least 7m(1640f in order to demonstrate clinically significant improvement in cardiopulmonary endurance and community ambulation    Baseline 06/08/2021= 600 ft. in 5 min- Stopped due to fatigue using 4WW; 07/01/2021= 665 in 5 min 45 sec using 4WW; 7/5: 367 ft with QC; 09/29/2021= 740 feet using upright 4WW   Time 4    Period Weeks    Status On-going    Target Date 12/15/2021             Plan - 08/20/21 0939     Clinical Impression Statement Treatment focused on LE strengthening in mostly seated and some limited standing due to fact that the patient was not feeling well. He was slower with initiation of movement and slower gait speed with walking today. He fatigued quicker requiring more time to complete some basic LE movements. The patient will continue to benefit from skilled  PT services to address deficits in gait, strength, endurance and balance to decrease risk for future falls.    Personal Factors and Comorbidities Comorbidity 3+    Comorbidities arthritis, bladder cancer, Trigeminal Neuralgia    Examination-Activity Limitations Caring for Others;Carry;Continence;Lift;Squat;Stairs;Stand    Examination-Participation Restrictions Community Activity;Yard Work    [employer] Evolving/Moderate complexity    Rehab Potential Good    PT Frequency 2x / week    PT Duration 12 weeks    PT Treatment/Interventions ADLs/Self Care Home Management;Cryotherapy;Canalith Repostioning;Electrical Stimulation;Moist  Heat;DME Instruction;Gait training;Stair training;Functional mobility training;Therapeutic activities;Therapeutic exercise;Balance training;Neuromuscular re-education;Patient/family education;Manual techniques;Passive range of motion;Dry needling;Vestibular    PT Next Visit Plan LE strength, improve step length with ambulation, muscle tissue lengthening    PT Home Exercise Plan Access Code: [email or web address]            10:31 AM, 10/06/21    Lewis Moccasin, PT Physical Therapist - [place] [phone]

## 2021-10-08 ENCOUNTER — Ambulatory Visit: Payer: Medicare HMO

## 2021-10-08 DIAGNOSIS — R262 Difficulty in walking, not elsewhere classified: Secondary | ICD-10-CM | POA: Diagnosis not present

## 2021-10-08 DIAGNOSIS — R269 Unspecified abnormalities of gait and mobility: Secondary | ICD-10-CM

## 2021-10-08 DIAGNOSIS — M6281 Muscle weakness (generalized): Secondary | ICD-10-CM

## 2021-10-08 NOTE — Therapy (Signed)
OUTPATIENT PHYSICAL THERAPY TREATMENT NOTE / PHYSICAL THERAPY PROGRESS NOTE   Dates of reporting period  09/02/21   to   10/08/21      Patient Name: [name] MRN: [id number] DOB:[birth year], 76 y.o., male Today's Date: 10/08/2021  PCP: [name]. [name], [place] PROVIDER: Dr. Gurney Maxin   PT End of Session - 10/08/21 1011     Visit Number 50    Number of Visits 11    Date for PT Re-Evaluation 12/15/21    Authorization Type [employer]    Authorization Time Period 07/01/21-09/20/21    Progress Note Due on Visit 60    PT Start Time 0921    PT Stop Time 1008    PT Time Calculation (min) 47 min    Equipment Utilized During Treatment Gait belt    Activity Tolerance Patient tolerated treatment well;No increased pain    Behavior During Therapy [facility] for tasks assessed/performed                  Past Medical History:  Diagnosis Date   Anxiety    Arteriosclerosis of coronary artery 11/16/2011   Overview:  Stent 10/2011 stent rca 2015 with collaterals to lad which is chronically occluded    Benign enlargement of prostate    Benign essential HTN 06/11/2014   Benign prostatic hypertrophy without urinary obstruction 07/31/2014   Bilateral cataracts 05/16/2013   Overview:  Dr. [facility]     Bone spur of foot    Left   BP (high blood pressure) 11/09/2012   Cancer ([place])    skin (forehead) and bladder   Carotid artery narrowing 02/08/2014   Depression    Detrusor hypertrophy    Diabetes ([place])    Diabetes mellitus, type 2 ([place]) 12/12/2012   Diverticulosis    Dyspnea    Esophageal reflux    Esophageal reflux    Fothergill's neuralgia 08/07/2012   Overview:  [facility]    Gastritis    GERD (gastroesophageal reflux disease)    Headache    cluster headaches   Healed myocardial infarct 11/09/2012   Hearing loss in left ear    Heart disease    Hematuria    Hemorrhoids    History of hiatal hernia 12/14/2017   small    Hypercholesteremia    Lesion of  bladder    Myocardial infarct (HCC)    Presence of stent in coronary artery 11/09/2012   Rectal bleeding    Trigeminal neuralgia    Trigeminal neuralgia    Valvular heart disease    Vitamin D deficiency    Past Surgical History:  Procedure Laterality Date   APPENDECTOMY     BOTOX INJECTION N/A 12/21/2017   Procedure: Bladder BOTOX INJECTION;  Surgeon: [name], MD;  Location: [facility] ORS;  Service: Urology;  Laterality: N/A;   BOTOX INJECTION N/A 09/11/2018   Procedure: Bladder BOTOX INJECTION;  Surgeon: [name], MD;  Location: [facility] ORS;  Service: Urology;  Laterality: N/A;   CARDIAC CATHETERIZATION     CARDIAC CATHETERIZATION N/A 01/22/2015   Procedure: Left Heart Cath;  Surgeon: Corey Skains, MD;  Location: [place] CV LAB;  Service: Cardiovascular;  Laterality: N/A;   CARDIAC CATHETERIZATION N/A 01/22/2015   Procedure: Coronary Stent Intervention;  Surgeon: Isaias Cowman, MD;  Location: [place] CV LAB;  Service: Cardiovascular;  Laterality: N/A;   CATARACT EXTRACTION, BILATERAL     COLONOSCOPY WITH PROPOFOL N/A 12/08/2015   Procedure: COLONOSCOPY WITH PROPOFOL;  Surgeon: Lollie Sails, MD;  Location: [facility] ENDOSCOPY;  Service: Endoscopy;  Laterality: N/A;   COLONOSCOPY WITH PROPOFOL N/A 12/09/2015   Procedure: COLONOSCOPY WITH PROPOFOL;  Surgeon: Lollie Sails, MD;  Location: [facility] ENDOSCOPY;  Service: Endoscopy;  Laterality: N/A;   CORONARY ANGIOPLASTY     5 stents   CORONARY STENT PLACEMENT  2015   x5   CYSTOSCOPY N/A 09/11/2018   Procedure: CYSTOSCOPY;  Surgeon: [name], MD;  Location: [facility] ORS;  Service: Urology;  Laterality: N/A;   CYSTOSCOPY WITH BIOPSY N/A 12/21/2017   Procedure: CYSTOSCOPY WITH BIOPSY;  Surgeon: [name], MD;  Location: [facility] ORS;  Service: Urology;  Laterality: N/A;   ESOPHAGOGASTRODUODENOSCOPY (EGD) WITH PROPOFOL N/A 12/08/2015   Procedure: ESOPHAGOGASTRODUODENOSCOPY (EGD) WITH PROPOFOL;  Surgeon: Lollie Sails, MD;  Location[facility] ENDOSCOPY;  Service: Endoscopy;  Laterality: N/A;   ESOPHAGOGASTRODUODENOSCOPY (EGD) WITH PROPOFOL N/A 12/13/2017   Procedure: ESOPHAGOGASTRODUODENOSCOPY (EGD) WITH PROPOFOL;  Surgeon: Lollie Sails, MD;  Location: [facility] ENDOSCOPY;  Service: Endoscopy;  Laterality: N/A;   EYE SURGERY     HERNIA REPAIR     kidney tumor remove     TRANSURETHRAL RESECTION OF BLADDER TUMOR WITH GYRUS (TURBT-GYRUS)  35/0093   UMBILICAL HERNIA REPAIR     urethral meatotomy     Patient Active Problem List   Diagnosis Date Noted   Class 2 obesity due to excess calories with body mass index (BMI) of 36.0 to 36.9 in adult 04/11/2020   Swelling of limb 03/28/2020   Lymphedema 03/28/2020   Trigeminal neuralgia 12/25/2019   Lower limb ulcer, calf, left, limited to breakdown of skin ([place]) 12/25/2019   OSA (obstructive sleep apnea) 81/82/9937   Acute systolic CHF (congestive heart failure) ([place]) 12/12/2018   Bruising 07/10/2018   Diet-controlled type 2 diabetes mellitus ([place]) 03/24/2018   Microalbuminuria 03/24/2018   Dizziness 11/17/2017   Simple chronic bronchitis ([place]) 09/22/2017   SOBOE (shortness of breath on exertion) 02/18/2017   Chronic midline low back pain without sciatica 12/29/2016   Periodic limb movement disorder 12/29/2016   Benign essential tremor 06/22/2016   Pure hypercholesterolemia 06/20/2015   Erectile dysfunction due to arterial insufficiency 06/16/2015   Unstable angina ([place]) 01/22/2015   Abdominal aortic 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 rupture ([place]) 12/31/2014   Benign prostatic hypertrophy without urinary obstruction 07/31/2014   Enlarged prostate 07/31/2014   Benign essential HTN 06/11/2014   Carotid artery narrowing 02/08/2014   Carotid artery obstruction 02/08/2014   Bilateral carotid artery stenosis 02/08/2014   Chest pain 08/20/2013   Bilateral cataracts 05/16/2013   Cataract 05/16/2013   Clinical depression 12/12/2012   Diabetes mellitus, type 2  ([place]) 12/12/2012   Combined fat and carbohydrate induced hyperlipemia 12/12/2012   Major depressive disorder, single episode, unspecified 12/12/2012   Acid reflux 11/09/2012   Presence of stent in coronary artery 11/09/2012   BP (high blood pressure) 11/09/2012   Healed myocardial infarct 11/09/2012   Gastro-esophageal reflux disease without esophagitis 11/09/2012   History of cardiovascular surgery 11/09/2012   Fothergill's neuralgia 08/07/2012   Swelling of testicle 04/06/2012   Disorder of male genital organ 04/06/2012   Swelling of the testicles 04/06/2012   Arteriosclerosis of coronary artery 11/16/2011   CAD in native artery 11/16/2011   Fatigue 06/04/2011   Avitaminosis D 11/30/2010    REFERRING DIAG: Repeated falls   THERAPY DIAG:   Difficulty in walking, not elsewhere classified  Muscle weakness (generalized)  Abnormality of gait and mobility  Rationale for Evaluation  and Treatment Rehabilitation   Subjective Assessment - 09/22/21 0936     Subjective Pt reports no new complaints upon arrival.  No falls reports.  Pt and wife are somewhat concerned about storms that will be present today.    Patient is accompained by: Family member -Wife   Pertinent History Patient is a 76 year old male with referral for Physical Therapy with diagnosis of Parkinsons disease and history of falling. He reports he diagnosed last year with Parkinsons and has improved his tremors with use of medication but reports worsening shuffling and difficulty with mobility. Patient has past medical history signifiant for Parkinsons; Arthritis, bladder cancer, Trigemic Neuralgia. He lives with wife in 1 level home and current using cane with reported recent fall.    Limitations Walking;Lifting;Standing;House hold activities    How long can you sit comfortably? No limits    How long can you stand comfortably? 15 min- limited due to back pain and foot numbess    How long can you walk comfortably? about 80  feet    Patient Stated Goals Improve my balance, be able to get up off the floor so I can maybe work in my garage again.    Currently in Pain? No/denies               PRECAUTIONS: Falls   PAIN:  No pain reported.    TODAY'S TREATMENT:    INTERVAL Nustep training: LE only with VC to keep SPM > 50 spm  L1x 6 min= 0.18 mi with no report of any increased pain.  Sit to stand without UE support x 12 reps Seated hip march with 3lb. 2 sets of 12 reps (VC for increased height)  Seated ham curls with BTB  Seated knee ext with 3lb  Seated hip flex/abd up and over 1/2 spike ball x 12 (patient reported soreness in left LE)  Ambulation- 160 feet with single HHA for partial mobility, no AD for 3/4 of the loop Standing hip circles (CW/CCW) x 25 reps each Standing hip flex into ext- Hip swings- 25 reps each Standing calf raises with 3 sec hold x 15 reps Standing toe raises with 3 sec hold x 15 reps   Education provided throughout session via VC/TC and demonstration to facilitate movement at target joints and correct muscle activation for all testing and exercises performed.     PATIENT EDUCATION: Education details: Exercise technique, instruction in use of AD (see note for details) Person educated: Patient Education method: Explanation, Demonstration, Tactile cues, and Verbal cues Education comprehension: verbalized understanding, returned demonstration, verbal cues required, tactile cues required, and needs further education   HOME EXERCISE PROGRAM: No updates today   PT Short Term Goals -       PT SHORT TERM GOAL #1   Title Pt will be independent with INITIAL  HEP in order to improve strength and balance in order to decrease fall risk and improve function at home and work.    Baseline 04/07/2021= No formal HEP in place. 05/11/2021= Patient reports doing pretty good- compliant with walking program and his stretching/strengthening.    Time 6    Period Weeks    Status Achieved     Target Date 05/19/21      PT SHORT TERM GOAL #2   Title Pt will decrease 5TSTS by at least 3 seconds in order to demonstrate clinically significant improvement in LE strength.    Baseline 04/07/2021= 25 sec; 05/11/2021= 18.65 sec    Time 6  Period Weeks    Status Achieved    Target Date 06/30/21              PT Long Term Goals -       PT LONG TERM GOAL #1   Title Pt will be independent with FINAL HEP in order to improve strength and balance in order to decrease fall risk and improve function at home and work.    Baseline 04/07/2021= No formal HEP in place. 07/01/2021=Patient reports performing walking and some standing LE strengthening exercises as instructed and states no questions at this time.  09/02/21: Pt reports he feels indep with exercises but is not performing HEP as much as he should   Time 12    Period Weeks    Status Partially Met    Target Date 09/23/21      PT LONG TERM GOAL #2   Title Pt will improve FOTO to target score of 45% to display perceived improvements in ability to complete ADL's.    Baseline 2/7/22023= 41%; FOTO=53%    Time 12    Period Weeks    Status Achieved    Target Date 06/30/21      PT LONG TERM GOAL #3   Title Pt will decrease 5TSTS by at least 5 seconds in order to demonstrate clinically significant improvement in LE strength.    Baseline 04/07/2021= 25 sec; 05/11/2021= 18.65 sec without UE support; 4/10= 13.76 sec without UE support    Time 12    Period Weeks    Status Achieved    Target Date 06/30/21      PT LONG TERM GOAL #4   Title Pt will decrease TUG to below 17 seconds/decrease in order to demonstrate decreased fall risk.    Baseline 04/07/2021= 21 sec with SPC; 05/11/2021= 18.85 sec with use of cane; 07/21/2021=18.88 sec  avg with use of cane; 7/5: 22.85 sec with QC; 09/29/2021= 21.45 sec using upright walker   Time 12    Period Weeks    Status Partially Met    Target Date 12/15/2021     PT LONG TERM GOAL #5   Title Pt will increase  10MWT by at least 0.15 m/s in order to demonstrate clinically significant improvement in community ambulation.    Baseline 04/07/2021= 0.56 m/s using SPC; 05/11/2021= 0.54 m/s using SPC; 07/01/2021= 0.6 m/s/ 7/5: 0.48 m/s without AD but with CGA; 09/29/2021= 0.64 m/s   Time 12    Period Weeks    Status On-going    Target Date 12/15/2021     PT LONG TERM GOAL #6   Title Pt will increase 6MWT by at least 36m(162f in order to demonstrate clinically significant improvement in cardiopulmonary endurance and community ambulation    Baseline 06/08/2021= 600 ft. in 5 min- Stopped due to fatigue using 4WW; 07/01/2021= 665 in 5 min 45 sec using 4WW; 7/5: 367 ft with QC; 09/29/2021= 740 feet using upright 4WW   Time 4    Period Weeks    Status On-going    Target Date 12/15/2021             Plan - 08/20/21 0939     Clinical Impression Statement Pt performed well with exercises and noted that be improved with gait function today.  Pt was able to take larger steps without VC's and progressed to not needing any AD for ~120 feet of the ambulation.  Pt reassessed for 5xSTS and regressed some as noted.  Other goals not  assessed as they are newer goals and will be continued to be monitored by main treating therapist.  Pt to continue with skilled therapy to address remaining goals and deficits.    Personal Factors and Comorbidities Comorbidity 3+    Comorbidities arthritis, bladder cancer, Trigeminal Neuralgia    Examination-Activity Limitations Caring for Others;Carry;Continence;Lift;Squat;Stairs;Stand    Examination-Participation Restrictions Community Activity;Yard Work    [employer] Evolving/Moderate complexity    Rehab Potential Good    PT Frequency 2x / week    PT Duration 12 weeks    PT Treatment/Interventions ADLs/Self Care Home Management;Cryotherapy;Canalith Repostioning;Electrical Stimulation;Moist Heat;DME Instruction;Gait training;Stair training;Functional mobility  training;Therapeutic activities;Therapeutic exercise;Balance training;Neuromuscular re-education;Patient/family education;Manual techniques;Passive range of motion;Dry needling;Vestibular    PT Next Visit Plan LE strength, improve step length with ambulation, muscle tissue lengthening    PT Home Exercise Plan Access Code: [email or web address]               Gwenlyn Saran, PT, DPT 10/08/21, 10:12 AM  Physical Therapist - [place] [phone]

## 2021-10-13 ENCOUNTER — Ambulatory Visit: Payer: Medicare HMO

## 2021-10-13 DIAGNOSIS — R2689 Other abnormalities of gait and mobility: Secondary | ICD-10-CM

## 2021-10-13 DIAGNOSIS — R269 Unspecified abnormalities of gait and mobility: Secondary | ICD-10-CM

## 2021-10-13 DIAGNOSIS — R262 Difficulty in walking, not elsewhere classified: Secondary | ICD-10-CM

## 2021-10-13 DIAGNOSIS — M6281 Muscle weakness (generalized): Secondary | ICD-10-CM

## 2021-10-13 DIAGNOSIS — M545 Low back pain, unspecified: Secondary | ICD-10-CM

## 2021-10-13 DIAGNOSIS — R278 Other lack of coordination: Secondary | ICD-10-CM

## 2021-10-13 DIAGNOSIS — R2681 Unsteadiness on feet: Secondary | ICD-10-CM

## 2021-10-13 NOTE — Therapy (Signed)
OUTPATIENT PHYSICAL THERAPY TREATMENT NOTE        Patient Name: [name] MRN: [id number] DOB:08-08-1945, 76 y.o., male Today's Date: 10/13/2021  PCP: [name]. [name], [place] PROVIDER: Dr. Gurney Maxin   PT End of Session - 10/13/21 0927     Visit Number 51    Number of Visits 22    Date for PT Re-Evaluation 12/15/21    Authorization Type [employer]    Authorization Time Period 07/01/21-09/20/21    Progress Note Due on Visit 60    PT Start Time 0930    PT Stop Time 1015    PT Time Calculation (min) 45 min    Equipment Utilized During Treatment Gait belt    Activity Tolerance Patient tolerated treatment well;No increased pain    Behavior During Therapy [facility] for tasks assessed/performed                  Past Medical History:  Diagnosis Date   Anxiety    Arteriosclerosis of coronary artery 11/16/2011   Overview:  Stent 10/2011 stent rca 2015 with collaterals to lad which is chronically occluded    Benign enlargement of prostate    Benign essential HTN 06/11/2014   Benign prostatic hypertrophy without urinary obstruction 07/31/2014   Bilateral cataracts 05/16/2013   Overview:  Dr. [facility]     Bone spur of foot    Left   BP (high blood pressure) 11/09/2012   Cancer ([place])    skin (forehead) and bladder   Carotid artery narrowing 02/08/2014   Depression    Detrusor hypertrophy    Diabetes ([place])    Diabetes mellitus, type 2 ([place]) 12/12/2012   Diverticulosis    Dyspnea    Esophageal reflux    Esophageal reflux    Fothergill's neuralgia 08/07/2012   Overview:  [facility]    Gastritis    GERD (gastroesophageal reflux disease)    Headache    cluster headaches   Healed myocardial infarct 11/09/2012   Hearing loss in left ear    Heart disease    Hematuria    Hemorrhoids    History of hiatal hernia 12/14/2017   small    Hypercholesteremia    Lesion of bladder    Myocardial infarct (HCC)    Presence of stent in coronary artery  11/09/2012   Rectal bleeding    Trigeminal neuralgia    Trigeminal neuralgia    Valvular heart disease    Vitamin D deficiency    Past Surgical History:  Procedure Laterality Date   APPENDECTOMY     BOTOX INJECTION N/A 12/21/2017   Procedure: Bladder BOTOX INJECTION;  Surgeon: [name], MD;  Location: [facility] ORS;  Service: Urology;  Laterality: N/A;   BOTOX INJECTION N/A 09/11/2018   Procedure: Bladder BOTOX INJECTION;  Surgeon: [name], MD;  Location: [facility] ORS;  Service: Urology;  Laterality: N/A;   CARDIAC CATHETERIZATION     CARDIAC CATHETERIZATION N/A 01/22/2015   Procedure: Left Heart Cath;  Surgeon: Corey Skains, MD;  Location: [place] CV LAB;  Service: Cardiovascular;  Laterality: N/A;   CARDIAC CATHETERIZATION N/A 01/22/2015   Procedure: Coronary Stent Intervention;  Surgeon: Isaias Cowman, MD;  Location: [place] CV LAB;  Service: Cardiovascular;  Laterality: N/A;   CATARACT EXTRACTION, BILATERAL     COLONOSCOPY WITH PROPOFOL N/A 12/08/2015   Procedure: COLONOSCOPY WITH PROPOFOL;  Surgeon: Lollie Sails, MD;  Location: [facility] ENDOSCOPY;  Service: Endoscopy;  Laterality: N/A;  COLONOSCOPY WITH PROPOFOL N/A 12/09/2015   Procedure: COLONOSCOPY WITH PROPOFOL;  Surgeon: Lollie Sails, MD;  Location: [facility] ENDOSCOPY;  Service: Endoscopy;  Laterality: N/A;   CORONARY ANGIOPLASTY     5 stents   CORONARY STENT PLACEMENT  2015   x5   CYSTOSCOPY N/A 09/11/2018   Procedure: CYSTOSCOPY;  Surgeon: [name], MD;  Location: [facility] ORS;  Service: Urology;  Laterality: N/A;   CYSTOSCOPY WITH BIOPSY N/A 12/21/2017   Procedure: CYSTOSCOPY WITH BIOPSY;  Surgeon: [name], MD;  Location: [facility] ORS;  Service: Urology;  Laterality: N/A;   ESOPHAGOGASTRODUODENOSCOPY (EGD) WITH PROPOFOL N/A 12/08/2015   Procedure: ESOPHAGOGASTRODUODENOSCOPY (EGD) WITH PROPOFOL;  Surgeon: Lollie Sails, MD;  Location: [facility] ENDOSCOPY;  Service: Endoscopy;  Laterality:  N/A;   ESOPHAGOGASTRODUODENOSCOPY (EGD) WITH PROPOFOL N/A 12/13/2017   Procedure: ESOPHAGOGASTRODUODENOSCOPY (EGD) WITH PROPOFOL;  Surgeon: Lollie Sails, MD;  Location: [facility] ENDOSCOPY;  Service: Endoscopy;  Laterality: N/A;   EYE SURGERY     HERNIA REPAIR     kidney tumor remove     TRANSURETHRAL RESECTION OF BLADDER TUMOR WITH GYRUS (TURBT-GYRUS)  84/1324   UMBILICAL HERNIA REPAIR     urethral meatotomy     Patient Active Problem List   Diagnosis Date Noted   Class 2 obesity due to excess calories with body mass index (BMI) of 36.0 to 36.9 in adult 04/11/2020   Swelling of limb 03/28/2020   Lymphedema 03/28/2020   Trigeminal neuralgia 12/25/2019   Lower limb ulcer, calf, left, limited to breakdown of skin ([place]) 12/25/2019   OSA (obstructive sleep apnea) 40/11/2723   Acute systolic CHF (congestive heart failure) ([place]) 12/12/2018   Bruising 07/10/2018   Diet-controlled type 2 diabetes mellitus ([place]) 03/24/2018   Microalbuminuria 03/24/2018   Dizziness 11/17/2017   Simple chronic bronchitis ([place]) 09/22/2017   SOBOE (shortness of breath on exertion) 02/18/2017   Chronic midline low back pain without sciatica 12/29/2016   Periodic limb movement disorder 12/29/2016   Benign essential tremor 06/22/2016   Pure hypercholesterolemia 06/20/2015   Erectile dysfunction due to arterial insufficiency 06/16/2015   Unstable angina ([place]) 01/22/2015   Abdominal aortic 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 rupture ([place]) 12/31/2014   Benign prostatic hypertrophy without urinary obstruction 07/31/2014   Enlarged prostate 07/31/2014   Benign essential HTN 06/11/2014   Carotid artery narrowing 02/08/2014   Carotid artery obstruction 02/08/2014   Bilateral carotid artery stenosis 02/08/2014   Chest pain 08/20/2013   Bilateral cataracts 05/16/2013   Cataract 05/16/2013   Clinical depression 12/12/2012   Diabetes mellitus, type 2 ([place]) 12/12/2012   Combined fat and carbohydrate induced hyperlipemia  12/12/2012   Major depressive disorder, single episode, unspecified 12/12/2012   Acid reflux 11/09/2012   Presence of stent in coronary artery 11/09/2012   BP (high blood pressure) 11/09/2012   Healed myocardial infarct 11/09/2012   Gastro-esophageal reflux disease without esophagitis 11/09/2012   History of cardiovascular surgery 11/09/2012   Fothergill's neuralgia 08/07/2012   Swelling of testicle 04/06/2012   Disorder of male genital organ 04/06/2012   Swelling of the testicles 04/06/2012   Arteriosclerosis of coronary artery 11/16/2011   CAD in native artery 11/16/2011   Fatigue 06/04/2011   Avitaminosis D 11/30/2010    REFERRING DIAG: Repeated falls   THERAPY DIAG:   Difficulty in walking, not elsewhere classified  Muscle weakness (generalized)  Abnormality of gait and mobility  Other abnormalities of gait and mobility  Other lack of coordination  Unsteadiness on feet  Chronic bilateral low back  pain without sciatica  Rationale for Evaluation and Treatment Rehabilitation   Subjective Assessment - 09/22/21 0936     Subjective Patient reports one fall yesterday as he was getting out of bed, slipping on rug. Unable to get back up without assistance. Denies any injury and wife reports they tried removing the rug but floor too slippery so going to add a non-skid rug.    Patient is accompained by: Family member -Wife   Pertinent History Patient is a 76 year old male with referral for Physical Therapy with diagnosis of Parkinsons disease and history of falling. He reports he diagnosed last year with Parkinsons and has improved his tremors with use of medication but reports worsening shuffling and difficulty with mobility. Patient has past medical history signifiant for Parkinsons; Arthritis, bladder cancer, Trigemic Neuralgia. He lives with wife in 1 level home and current using cane with reported recent fall.    Limitations Walking;Lifting;Standing;House hold activities     How long can you sit comfortably? No limits    How long can you stand comfortably? 15 min- limited due to back pain and foot numbess    How long can you walk comfortably? about 80 feet    Patient Stated Goals Improve my balance, be able to get up off the floor so I can maybe work in my garage again.    Currently in Pain? No/denies               PRECAUTIONS: Falls   PAIN:  No pain reported.    TODAY'S TREATMENT:    Nustep training: LE only with VC to keep SPM > 50 spm  L3 x 6 min= 0.16 mi with no report of any increased pain.  Sit to stand without UE support (arms outstretched holding onto 1kg) ball x 12 reps Forward/retro gait using 3lb AW BLE (approx 8 steps forward/14 steps backward) in // bars without UE support x 5  Standing hip march with 3lb +march into RTB tied across top of bars for feedback to raise knee as high as possible x 12 reps each LE Standing ham curls 3lb AW x 12 reps BLE Standing donkey kicks 3lb AW x 12 reps BLE Standing hip flex into ext- Hip swings- 25 reps each Standing calf raises with 3 sec hold x 15 reps Standing toe raises with 3 sec hold x 15 reps   Education provided throughout session via VC/TC and demonstration to facilitate movement at target joints and correct muscle activation for all testing and exercises performed.     PATIENT EDUCATION: Education details: Exercise technique, instruction in use of AD (see note for details) Person educated: Patient Education method: Explanation, Demonstration, Tactile cues, and Verbal cues Education comprehension: verbalized understanding, returned demonstration, verbal cues required, tactile cues required, and needs further education   HOME EXERCISE PROGRAM: No updates today   PT Short Term Goals -       PT SHORT TERM GOAL #1   Title Pt will be independent with INITIAL  HEP in order to improve strength and balance in order to decrease fall risk and improve function at home and work.     Baseline 04/07/2021= No formal HEP in place. 05/11/2021= Patient reports doing pretty good- compliant with walking program and his stretching/strengthening.    Time 6    Period Weeks    Status Achieved    Target Date 05/19/21      PT SHORT TERM GOAL #2   Title Pt will decrease 5TSTS by  at least 3 seconds in order to demonstrate clinically significant improvement in LE strength.    Baseline 04/07/2021= 25 sec; 05/11/2021= 18.65 sec    Time 6    Period Weeks    Status Achieved    Target Date 06/30/21              PT Long Term Goals -       PT LONG TERM GOAL #1   Title Pt will be independent with FINAL HEP in order to improve strength and balance in order to decrease fall risk and improve function at home and work.    Baseline 04/07/2021= No formal HEP in place. 07/01/2021=Patient reports performing walking and some standing LE strengthening exercises as instructed and states no questions at this time.  09/02/21: Pt reports he feels indep with exercises but is not performing HEP as much as he should   Time 12    Period Weeks    Status Partially Met    Target Date 09/23/21      PT LONG TERM GOAL #2   Title Pt will improve FOTO to target score of 45% to display perceived improvements in ability to complete ADL's.    Baseline 2/7/22023= 41%; FOTO=53%    Time 12    Period Weeks    Status Achieved    Target Date 06/30/21      PT LONG TERM GOAL #3   Title Pt will decrease 5TSTS by at least 5 seconds in order to demonstrate clinically significant improvement in LE strength.    Baseline 04/07/2021= 25 sec; 05/11/2021= 18.65 sec without UE support; 4/10= 13.76 sec without UE support    Time 12    Period Weeks    Status Achieved    Target Date 06/30/21      PT LONG TERM GOAL #4   Title Pt will decrease TUG to below 17 seconds/decrease in order to demonstrate decreased fall risk.    Baseline 04/07/2021= 21 sec with SPC; 05/11/2021= 18.85 sec with use of cane; 07/21/2021=18.88 sec  avg with use of  cane; 7/5: 22.85 sec with QC; 09/29/2021= 21.45 sec using upright walker   Time 12    Period Weeks    Status Partially Met    Target Date 12/15/2021     PT LONG TERM GOAL #5   Title Pt will increase 10MWT by at least 0.15 m/s in order to demonstrate clinically significant improvement in community ambulation.    Baseline 04/07/2021= 0.56 m/s using SPC; 05/11/2021= 0.54 m/s using SPC; 07/01/2021= 0.6 m/s/ 7/5: 0.48 m/s without AD but with CGA; 09/29/2021= 0.64 m/s   Time 12    Period Weeks    Status On-going    Target Date 12/15/2021     PT LONG TERM GOAL #6   Title Pt will increase 6MWT by at least 88m(1652f in order to demonstrate clinically significant improvement in cardiopulmonary endurance and community ambulation    Baseline 06/08/2021= 600 ft. in 5 min- Stopped due to fatigue using 4WW; 07/01/2021= 665 in 5 min 45 sec using 4WW; 7/5: 367 ft with QC; 09/29/2021= 740 feet using upright 4WW   Time 4    Period Weeks    Status On-going    Target Date 12/15/2021             Plan - 08/20/21 0939     Clinical Impression Statement  Continued PT today per plan focusing on LE strength, ROM, and balance. Patient responded well to more standing  therex today - denied any pain. He was challenged with retro steps - difficulty progressing due to weakness and some imbalance. Otherwise he performed well- able to sit to stand transfer and reach overhead well without any pain. Pt to continue with skilled Physical Therapy to address remaining goals and deficits.    Personal Factors and Comorbidities Comorbidity 3+    Comorbidities arthritis, bladder cancer, Trigeminal Neuralgia    Examination-Activity Limitations Caring for Others;Carry;Continence;Lift;Squat;Stairs;Stand    Examination-Participation Restrictions Community Activity;Yard Work    [employer] Evolving/Moderate complexity    Rehab Potential Good    PT Frequency 2x / week    PT Duration 12 weeks    PT  Treatment/Interventions ADLs/Self Care Home Management;Cryotherapy;Canalith Repostioning;Electrical Stimulation;Moist Heat;DME Instruction;Gait training;Stair training;Functional mobility training;Therapeutic activities;Therapeutic exercise;Balance training;Neuromuscular re-education;Patient/family education;Manual techniques;Passive range of motion;Dry needling;Vestibular    PT Next Visit Plan LE strength, improve step length with ambulation, muscle tissue lengthening    PT Home Exercise Plan Access Code: [email or web address]               Ollen Bowl, PT 10/13/21, 10:20 AM  Physical Therapist - [place] [phone]

## 2021-10-15 ENCOUNTER — Ambulatory Visit: Payer: Medicare HMO

## 2021-10-15 DIAGNOSIS — R269 Unspecified abnormalities of gait and mobility: Secondary | ICD-10-CM

## 2021-10-15 DIAGNOSIS — M6281 Muscle weakness (generalized): Secondary | ICD-10-CM

## 2021-10-15 DIAGNOSIS — R262 Difficulty in walking, not elsewhere classified: Secondary | ICD-10-CM | POA: Diagnosis not present

## 2021-10-15 DIAGNOSIS — G8929 Other chronic pain: Secondary | ICD-10-CM

## 2021-10-15 DIAGNOSIS — R2689 Other abnormalities of gait and mobility: Secondary | ICD-10-CM

## 2021-10-15 DIAGNOSIS — R278 Other lack of coordination: Secondary | ICD-10-CM

## 2021-10-15 DIAGNOSIS — R2681 Unsteadiness on feet: Secondary | ICD-10-CM

## 2021-10-15 NOTE — Therapy (Signed)
OUTPATIENT PHYSICAL THERAPY TREATMENT NOTE        Patient Name: [name] MRN: [id number] DOB:10-Mar-1945, 76 y.o., male Today's Date: 10/15/2021  PCP: [name]. [name], [place] PROVIDER: Dr. Gurney Maxin   PT End of Session - 10/15/21 0953     Visit Number 52    Number of Visits 47    Date for PT Re-Evaluation 12/15/21    Authorization Type [employer]    Authorization Time Period 07/01/21-09/20/21    Progress Note Due on Visit 60    PT Start Time 0936    PT Stop Time 1014    PT Time Calculation (min) 38 min    Equipment Utilized During Treatment Gait belt    Activity Tolerance Patient tolerated treatment well;No increased pain    Behavior During Therapy [facility] for tasks assessed/performed                  Past Medical History:  Diagnosis Date   Anxiety    Arteriosclerosis of coronary artery 11/16/2011   Overview:  Stent 10/2011 stent rca 2015 with collaterals to lad which is chronically occluded    Benign enlargement of prostate    Benign essential HTN 06/11/2014   Benign prostatic hypertrophy without urinary obstruction 07/31/2014   Bilateral cataracts 05/16/2013   Overview:  Dr. [facility]     Bone spur of foot    Left   BP (high blood pressure) 11/09/2012   Cancer ([place])    skin (forehead) and bladder   Carotid artery narrowing 02/08/2014   Depression    Detrusor hypertrophy    Diabetes ([place])    Diabetes mellitus, type 2 ([place]) 12/12/2012   Diverticulosis    Dyspnea    Esophageal reflux    Esophageal reflux    Fothergill's neuralgia 08/07/2012   Overview:  [facility]    Gastritis    GERD (gastroesophageal reflux disease)    Headache    cluster headaches   Healed myocardial infarct 11/09/2012   Hearing loss in left ear    Heart disease    Hematuria    Hemorrhoids    History of hiatal hernia 12/14/2017   small    Hypercholesteremia    Lesion of bladder    Myocardial infarct (HCC)    Presence of stent in coronary artery  11/09/2012   Rectal bleeding    Trigeminal neuralgia    Trigeminal neuralgia    Valvular heart disease    Vitamin D deficiency    Past Surgical History:  Procedure Laterality Date   APPENDECTOMY     BOTOX INJECTION N/A 12/21/2017   Procedure: Bladder BOTOX INJECTION;  Surgeon: [name], MD;  Location: [facility] ORS;  Service: Urology;  Laterality: N/A;   BOTOX INJECTION N/A 09/11/2018   Procedure: Bladder BOTOX INJECTION;  Surgeon: [name], MD;  Location: [facility] ORS;  Service: Urology;  Laterality: N/A;   CARDIAC CATHETERIZATION     CARDIAC CATHETERIZATION N/A 01/22/2015   Procedure: Left Heart Cath;  Surgeon: Corey Skains, MD;  Location: [place] CV LAB;  Service: Cardiovascular;  Laterality: N/A;   CARDIAC CATHETERIZATION N/A 01/22/2015   Procedure: Coronary Stent Intervention;  Surgeon: Isaias Cowman, MD;  Location: [place] CV LAB;  Service: Cardiovascular;  Laterality: N/A;   CATARACT EXTRACTION, BILATERAL     COLONOSCOPY WITH PROPOFOL N/A 12/08/2015   Procedure: COLONOSCOPY WITH PROPOFOL;  Surgeon: Lollie Sails, MD;  Location: [place] ENDOSCOPY;  Service: Endoscopy;  Laterality: N/A;  COLONOSCOPY WITH PROPOFOL N/A 12/09/2015   Procedure: COLONOSCOPY WITH PROPOFOL;  Surgeon: Lollie Sails, MD;  Location: [facility] ENDOSCOPY;  Service: Endoscopy;  Laterality: N/A;   CORONARY ANGIOPLASTY     5 stents   CORONARY STENT PLACEMENT  2015   x5   CYSTOSCOPY N/A 09/11/2018   Procedure: CYSTOSCOPY;  Surgeon: [name], MD;  Location: [facility] ORS;  Service: Urology;  Laterality: N/A;   CYSTOSCOPY WITH BIOPSY N/A 12/21/2017   Procedure: CYSTOSCOPY WITH BIOPSY;  Surgeon: [name], MD;  Location: [facility] ORS;  Service: Urology;  Laterality: N/A;   ESOPHAGOGASTRODUODENOSCOPY (EGD) WITH PROPOFOL N/A 12/08/2015   Procedure: ESOPHAGOGASTRODUODENOSCOPY (EGD) WITH PROPOFOL;  Surgeon: Lollie Sails, MD;  Location: [facility] ENDOSCOPY;  Service: Endoscopy;  Laterality:  N/A;   ESOPHAGOGASTRODUODENOSCOPY (EGD) WITH PROPOFOL N/A 12/13/2017   Procedure: ESOPHAGOGASTRODUODENOSCOPY (EGD) WITH PROPOFOL;  Surgeon: Lollie Sails, MD;  Location: [facility] ENDOSCOPY;  Service: Endoscopy;  Laterality: N/A;   EYE SURGERY     HERNIA REPAIR     kidney tumor remove     TRANSURETHRAL RESECTION OF BLADDER TUMOR WITH GYRUS (TURBT-GYRUS)  84/1324   UMBILICAL HERNIA REPAIR     urethral meatotomy     Patient Active Problem List   Diagnosis Date Noted   Class 2 obesity due to excess calories with body mass index (BMI) of 36.0 to 36.9 in adult 04/11/2020   Swelling of limb 03/28/2020   Lymphedema 03/28/2020   Trigeminal neuralgia 12/25/2019   Lower limb ulcer, calf, left, limited to breakdown of skin ([place]) 12/25/2019   OSA (obstructive sleep apnea) 40/11/2723   Acute systolic CHF (congestive heart failure) ([place]) 12/12/2018   Bruising 07/10/2018   Diet-controlled type 2 diabetes mellitus ([place]) 03/24/2018   Microalbuminuria 03/24/2018   Dizziness 11/17/2017   Simple chronic bronchitis ([place]) 09/22/2017   SOBOE (shortness of breath on exertion) 02/18/2017   Chronic midline low back pain without sciatica 12/29/2016   Periodic limb movement disorder 12/29/2016   Benign essential tremor 06/22/2016   Pure hypercholesterolemia 06/20/2015   Erectile dysfunction due to arterial insufficiency 06/16/2015   Unstable angina ([place]) 01/22/2015   Abdominal aortic 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 rupture ([place]) 12/31/2014   Benign prostatic hypertrophy without urinary obstruction 07/31/2014   Enlarged prostate 07/31/2014   Benign essential HTN 06/11/2014   Carotid artery narrowing 02/08/2014   Carotid artery obstruction 02/08/2014   Bilateral carotid artery stenosis 02/08/2014   Chest pain 08/20/2013   Bilateral cataracts 05/16/2013   Cataract 05/16/2013   Clinical depression 12/12/2012   Diabetes mellitus, type 2 ([place]) 12/12/2012   Combined fat and carbohydrate induced hyperlipemia  12/12/2012   Major depressive disorder, single episode, unspecified 12/12/2012   Acid reflux 11/09/2012   Presence of stent in coronary artery 11/09/2012   BP (high blood pressure) 11/09/2012   Healed myocardial infarct 11/09/2012   Gastro-esophageal reflux disease without esophagitis 11/09/2012   History of cardiovascular surgery 11/09/2012   Fothergill's neuralgia 08/07/2012   Swelling of testicle 04/06/2012   Disorder of male genital organ 04/06/2012   Swelling of the testicles 04/06/2012   Arteriosclerosis of coronary artery 11/16/2011   CAD in native artery 11/16/2011   Fatigue 06/04/2011   Avitaminosis D 11/30/2010    REFERRING DIAG: Repeated falls   THERAPY DIAG:   Difficulty in walking, not elsewhere classified  Muscle weakness (generalized)  Abnormality of gait and mobility  Other abnormalities of gait and mobility  Other lack of coordination  Unsteadiness on feet  Chronic bilateral low back  pain without sciatica  Rationale for Evaluation and Treatment Rehabilitation   Subjective Assessment - 09/22/21 0936     Subjective Patient reports having a good day today but legs were slow moving yesterday.    Patient is accompained by: Family member -Wife   Pertinent History Patient is a 76 year old male with referral for Physical Therapy with diagnosis of Parkinsons disease and history of falling. He reports he diagnosed last year with Parkinsons and has improved his tremors with use of medication but reports worsening shuffling and difficulty with mobility. Patient has past medical history signifiant for Parkinsons; Arthritis, bladder cancer, Trigemic Neuralgia. He lives with wife in 1 level home and current using cane with reported recent fall.    Limitations Walking;Lifting;Standing;House hold activities    How long can you sit comfortably? No limits    How long can you stand comfortably? 15 min- limited due to back pain and foot numbess    How long can you walk  comfortably? about 80 feet    Patient Stated Goals Improve my balance, be able to get up off the floor so I can maybe work in my garage again.    Currently in Pain? No/denies               PRECAUTIONS: Falls   PAIN:  No pain reported.    TODAY'S TREATMENT:   Gait in hallway- horizontal head turning x 200 feet total - Using upright 4WW- CGA- mild difficulty initially but did improve with practice.   Gas/brake activity using green and red cones: Patient was sitting and would kick leg forward to appropriate cone upon command with cognitive task- PT would call out (gas/green/go/accelerate...etc and patient would kick leg out toward green cone then opp for red- Stop, slow down, etc...)   4 square activities:  Upon command patient would perform cognitive task and physical task by either stepping forward, side, backward, diagonals while PT would provide math equation and patient had to step into numbered square (either 1,2,3,4)   Steps: ascending/descending 4 steps with B rails x 3 without rest for total of 12 steps  Sit to stand without UE support (arms outstretched) x 12 reps  Standing hip flex into ext- Hip swings- 25 reps each Reverse lunges x 20 reps each LE  Education provided throughout session via VC/TC and demonstration to facilitate movement at target joints and correct muscle activation for all testing and exercises performed.     PATIENT EDUCATION: Education details: Exercise technique, instruction in use of AD (see note for details) Person educated: Patient Education method: Explanation, Demonstration, Tactile cues, and Verbal cues Education comprehension: verbalized understanding, returned demonstration, verbal cues required, tactile cues required, and needs further education   HOME EXERCISE PROGRAM: No updates today   PT Short Term Goals -       PT SHORT TERM GOAL #1   Title Pt will be independent with INITIAL  HEP in order to improve strength and balance in  order to decrease fall risk and improve function at home and work.    Baseline 04/07/2021= No formal HEP in place. 05/11/2021= Patient reports doing pretty good- compliant with walking program and his stretching/strengthening.    Time 6    Period Weeks    Status Achieved    Target Date 05/19/21      PT SHORT TERM GOAL #2   Title Pt will decrease 5TSTS by at least 3 seconds in order to demonstrate clinically significant improvement in LE strength.  Baseline 04/07/2021= 25 sec; 05/11/2021= 18.65 sec    Time 6    Period Weeks    Status Achieved    Target Date 06/30/21              PT Long Term Goals -       PT LONG TERM GOAL #1   Title Pt will be independent with FINAL HEP in order to improve strength and balance in order to decrease fall risk and improve function at home and work.    Baseline 04/07/2021= No formal HEP in place. 07/01/2021=Patient reports performing walking and some standing LE strengthening exercises as instructed and states no questions at this time.  09/02/21: Pt reports he feels indep with exercises but is not performing HEP as much as he should   Time 12    Period Weeks    Status Partially Met    Target Date 09/23/21      PT LONG TERM GOAL #2   Title Pt will improve FOTO to target score of 45% to display perceived improvements in ability to complete ADL's.    Baseline 2/7/22023= 41%; FOTO=53%    Time 12    Period Weeks    Status Achieved    Target Date 06/30/21      PT LONG TERM GOAL #3   Title Pt will decrease 5TSTS by at least 5 seconds in order to demonstrate clinically significant improvement in LE strength.    Baseline 04/07/2021= 25 sec; 05/11/2021= 18.65 sec without UE support; 4/10= 13.76 sec without UE support    Time 12    Period Weeks    Status Achieved    Target Date 06/30/21      PT LONG TERM GOAL #4   Title Pt will decrease TUG to below 17 seconds/decrease in order to demonstrate decreased fall risk.    Baseline 04/07/2021= 21 sec with SPC;  05/11/2021= 18.85 sec with use of cane; 07/21/2021=18.88 sec  avg with use of cane; 7/5: 22.85 sec with QC; 09/29/2021= 21.45 sec using upright walker   Time 12    Period Weeks    Status Partially Met    Target Date 12/15/2021     PT LONG TERM GOAL #5   Title Pt will increase 10MWT by at least 0.15 m/s in order to demonstrate clinically significant improvement in community ambulation.    Baseline 04/07/2021= 0.56 m/s using SPC; 05/11/2021= 0.54 m/s using SPC; 07/01/2021= 0.6 m/s/ 7/5: 0.48 m/s without AD but with CGA; 09/29/2021= 0.64 m/s   Time 12    Period Weeks    Status On-going    Target Date 12/15/2021     PT LONG TERM GOAL #6   Title Pt will increase 6MWT by at least 75m (122ft) in order to demonstrate clinically significant improvement in cardiopulmonary endurance and community ambulation    Baseline 06/08/2021= 600 ft. in 5 min- Stopped due to fatigue using 4WW; 07/01/2021= 665 in 5 min 45 sec using 4WW; 7/5: 367 ft with QC; 09/29/2021= 740 feet using upright 4WW   Time 4    Period Weeks    Status On-going    Target Date 12/15/2021             Plan - 08/20/21 0939     Clinical Impression Statement  Continued PT today per plan focusing on LE strength, ROM, and balance. Patient performed well - challenged with dual tasking today yet no LOB- some gait deviation with horizontal head turns and he will benefit  from review/progression next session. Pt to continue with skilled Physical Therapy to address remaining goals and deficits.    Personal Factors and Comorbidities Comorbidity 3+    Comorbidities arthritis, bladder cancer, Trigeminal Neuralgia    Examination-Activity Limitations Caring for Others;Carry;Continence;Lift;Squat;Stairs;Stand    Examination-Participation Restrictions Community Activity;Yard Work    [employer] Evolving/Moderate complexity    Rehab Potential Good    PT Frequency 2x / week    PT Duration 12 weeks    PT Treatment/Interventions ADLs/Self  Care Home Management;Cryotherapy;Canalith Repostioning;Electrical Stimulation;Moist Heat;DME Instruction;Gait training;Stair training;Functional mobility training;Therapeutic activities;Therapeutic exercise;Balance training;Neuromuscular re-education;Patient/family education;Manual techniques;Passive range of motion;Dry needling;Vestibular    PT Next Visit Plan LE strength, improve step length with ambulation, muscle tissue lengthening    PT Home Exercise Plan Access Code: [email or web address]               Ollen Bowl, PT 10/15/21, 5:08 PM  Physical Therapist - [place] [phone]

## 2021-10-20 ENCOUNTER — Ambulatory Visit: Payer: Medicare HMO

## 2021-10-20 DIAGNOSIS — R262 Difficulty in walking, not elsewhere classified: Secondary | ICD-10-CM | POA: Diagnosis not present

## 2021-10-20 DIAGNOSIS — R2681 Unsteadiness on feet: Secondary | ICD-10-CM

## 2021-10-20 DIAGNOSIS — R278 Other lack of coordination: Secondary | ICD-10-CM

## 2021-10-20 DIAGNOSIS — G8929 Other chronic pain: Secondary | ICD-10-CM

## 2021-10-20 DIAGNOSIS — R2689 Other abnormalities of gait and mobility: Secondary | ICD-10-CM

## 2021-10-20 DIAGNOSIS — M6281 Muscle weakness (generalized): Secondary | ICD-10-CM

## 2021-10-20 DIAGNOSIS — R269 Unspecified abnormalities of gait and mobility: Secondary | ICD-10-CM

## 2021-10-20 NOTE — Therapy (Signed)
OUTPATIENT PHYSICAL THERAPY TREATMENT NOTE        Patient Name: [name] MRN: [id number] DOB:[birth year], 76 y.o., male Today's Date: 10/20/2021  PCP: [name]. [name], [place] PROVIDER: Dr. Gurney Maxin   PT End of Session - 10/20/21 0930     Visit Number 54    Number of Visits 32    Date for PT Re-Evaluation 12/15/21    Authorization Type [employer]    Authorization Time Period 07/01/21-09/20/21    Progress Note Due on Visit 60    PT Start Time 0930    PT Stop Time 1014    PT Time Calculation (min) 44 min    Equipment Utilized During Treatment Gait belt    Activity Tolerance Patient tolerated treatment well;No increased pain    Behavior During Therapy [facility] for tasks assessed/performed                   Past Medical History:  Diagnosis Date   Anxiety    Arteriosclerosis of coronary artery 11/16/2011   Overview:  Stent 10/2011 stent rca 2015 with collaterals to lad which is chronically occluded    Benign enlargement of prostate    Benign essential HTN 06/11/2014   Benign prostatic hypertrophy without urinary obstruction 07/31/2014   Bilateral cataracts 05/16/2013   Overview:  Dr. [facility]     Bone spur of foot    Left   BP (high blood pressure) 11/09/2012   Cancer ([place])    skin (forehead) and bladder   Carotid artery narrowing 02/08/2014   Depression    Detrusor hypertrophy    Diabetes ([place])    Diabetes mellitus, type 2 ([place]) 12/12/2012   Diverticulosis    Dyspnea    Esophageal reflux    Esophageal reflux    Fothergill's neuralgia 08/07/2012   Overview:  [facility]    Gastritis    GERD (gastroesophageal reflux disease)    Headache    cluster headaches   Healed myocardial infarct 11/09/2012   Hearing loss in left ear    Heart disease    Hematuria    Hemorrhoids    History of hiatal hernia 12/14/2017   small    Hypercholesteremia    Lesion of bladder    Myocardial infarct (HCC)    Presence of stent in coronary artery  11/09/2012   Rectal bleeding    Trigeminal neuralgia    Trigeminal neuralgia    Valvular heart disease    Vitamin D deficiency    Past Surgical History:  Procedure Laterality Date   APPENDECTOMY     BOTOX INJECTION N/A 12/21/2017   Procedure: Bladder BOTOX INJECTION;  Surgeon: [name], MD;  Location: [facility] OR[facility];  Service: Urology;  Laterality: N/A;   BOTOX INJECTION N/A 09/11/2018   Procedure: Bladder BOTOX INJECTION;  Surgeon: [name], MD;  Location: [facility];  Service: Urology;  Laterality: N/A;   CARDIAC CATHETERIZATION     CARDIAC CATHETERIZATION N/A 01/22/2015   Procedure: Left Heart Cath;  Surgeon: Corey Skains, MD;  Location: [place] [facility];  Service: Cardiovascular;  Laterality: N/A;   CARDIAC CATHETERIZATION N/A 01/22/2015   Procedure: Coronary Stent Intervention;  Surgeon: Isaias Cowman, MD;  Location: [place] [facility];  Service: Cardiovascular;  Laterality: N/A;   CATARACT EXTRACTION, BILATERAL     COLONOSCOPY WITH PROPOFOL N/A 12/08/2015   Procedure: COLONOSCOPY WITH PROPOFOL;  Surgeon: Lollie Sails, MD;  Location: [facility] ENDOSCOPY;  Service: Endoscopy;  Laterality: N/A;  COLONOSCOPY WITH PROPOFOL N/A 12/09/2015   Procedure: COLONOSCOPY WITH PROPOFOL;  Surgeon: Lollie Sails, MD;  Location: [facility] ENDOSCOPY;  Service: Endoscopy;  Laterality: N/A;   CORONARY ANGIOPLASTY     5 stents   CORONARY STENT PLACEMENT  2015   x5   CYSTOSCOPY N/A 09/11/2018   Procedure: CYSTOSCOPY;  Surgeon: [name], MD;  Location: [facility] ORS;  Service: Urology;  Laterality: N/A;   CYSTOSCOPY WITH BIOPSY N/A 12/21/2017   Procedure: CYSTOSCOPY WITH BIOPSY;  Surgeon: [name], MD;  Location: [facility] ORS;  Service: Urology;  Laterality: N/A;   ESOPHAGOGASTRODUODENOSCOPY (EGD) WITH PROPOFOL N/A 12/08/2015   Procedure: ESOPHAGOGASTRODUODENOSCOPY (EGD) WITH PROPOFOL;  Surgeon: Lollie Sails, MD;  Location: [facility] ENDOSCOPY;  Service: Endoscopy;  Laterality:  N/A;   ESOPHAGOGASTRODUODENOSCOPY (EGD) WITH PROPOFOL N/A 12/13/2017   Procedure: ESOPHAGOGASTRODUODENOSCOPY (EGD) WITH PROPOFOL;  Surgeon: Lollie Sails, MD;  Location: [facility] ENDOSCOPY;  Service: Endoscopy;  Laterality: N/A;   EYE SURGERY     HERNIA REPAIR     kidney tumor remove     TRANSURETHRAL RESECTION OF BLADDER TUMOR WITH GYRUS (TURBT-GYRUS)  84/1324   UMBILICAL HERNIA REPAIR     urethral meatotomy     Patient Active Problem List   Diagnosis Date Noted   Class 2 obesity due to excess calories with body mass index (BMI) of 36.0 to 36.9 in adult 04/11/2020   Swelling of limb 03/28/2020   Lymphedema 03/28/2020   Trigeminal neuralgia 12/25/2019   Lower limb ulcer, calf, left, limited to breakdown of skin ([place]) 12/25/2019   OSA (obstructive sleep apnea) 40/11/2723   Acute systolic CHF (congestive heart failure) ([place]) 12/12/2018   Bruising 07/10/2018   Diet-controlled type 2 diabetes mellitus ([place]) 03/24/2018   Microalbuminuria 03/24/2018   Dizziness 11/17/2017   Simple chronic bronchitis ([place]) 09/22/2017   SOBOE (shortness of breath on exertion) 02/18/2017   Chronic midline low back pain without sciatica 12/29/2016   Periodic limb movement disorder 12/29/2016   Benign essential tremor 06/22/2016   Pure hypercholesterolemia 06/20/2015   Erectile dysfunction due to arterial insufficiency 06/16/2015   Unstable angina ([place]) 01/22/2015   Abdominal aortic 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 rupture ([place]) 12/31/2014   Benign prostatic hypertrophy without urinary obstruction 07/31/2014   Enlarged prostate 07/31/2014   Benign essential HTN 06/11/2014   Carotid artery narrowing 02/08/2014   Carotid artery obstruction 02/08/2014   Bilateral carotid artery stenosis 02/08/2014   Chest pain 08/20/2013   Bilateral cataracts 05/16/2013   Cataract 05/16/2013   Clinical depression 12/12/2012   Diabetes mellitus, type 2 ([place]) 12/12/2012   Combined fat and carbohydrate induced hyperlipemia  12/12/2012   Major depressive disorder, single episode, unspecified 12/12/2012   Acid reflux 11/09/2012   Presence of stent in coronary artery 11/09/2012   BP (high blood pressure) 11/09/2012   Healed myocardial infarct 11/09/2012   Gastro-esophageal reflux disease without esophagitis 11/09/2012   History of cardiovascular surgery 11/09/2012   Fothergill's neuralgia 08/07/2012   Swelling of testicle 04/06/2012   Disorder of male genital organ 04/06/2012   Swelling of the testicles 04/06/2012   Arteriosclerosis of coronary artery 11/16/2011   CAD in native artery 11/16/2011   Fatigue 06/04/2011   Avitaminosis D 11/30/2010    REFERRING DIAG: Repeated falls   THERAPY DIAG:   Difficulty in walking, not elsewhere classified  Muscle weakness (generalized)  Abnormality of gait and mobility  Other abnormalities of gait and mobility  Other lack of coordination  Unsteadiness on feet  Chronic bilateral low back  pain without sciatica  Rationale for Evaluation and Treatment Rehabilitation   Subjective Assessment - 09/22/21 0936     Subjective  Patient reports moving slow- had a fall while sleeping in his computer chair and fell out - bruising his right LE and states his ribs are sore. States he was able to get up on his own and did not seek medical attention.    Patient is accompained by: Family member -Wife   Pertinent History Patient is a 76 year old male with referral for Physical Therapy with diagnosis of Parkinsons disease and history of falling. He reports he diagnosed last year with Parkinsons and has improved his tremors with use of medication but reports worsening shuffling and difficulty with mobility. Patient has past medical history signifiant for Parkinsons; Arthritis, bladder cancer, Trigemic Neuralgia. He lives with wife in 1 level home and current using cane with reported recent fall.    Limitations Walking;Lifting;Standing;House hold activities    How long can you sit  comfortably? No limits    How long can you stand comfortably? 15 min- limited due to back pain and foot numbess    How long can you walk comfortably? about 80 feet    Patient Stated Goals Improve my balance, be able to get up off the floor so I can maybe work in my garage again.    Currently in Pain? No/denies               PRECAUTIONS: Falls   PAIN:  No pain reported.    TODAY'S TREATMENT:   Therex:  Nustep- L0 for 1 min; L2 for 2 min; L3 for 2 min; level 1 for 1 min- Focusing on BUE/LE muscle strength.  Neuromuscular re-ed  Obstacle course in // bars with BUE support- Stepping over several 1/2 foam rolls and up/down 6" block x 10 - Forward -down and back. Obstacle course in // bars with BUE support- Stepping over several 1/2 foam rolls and up/down 6" block x 10 -Side step - down and back Staggered stand with arms straight and outstretched to around 90 deg abd- trunk rotation x 15 reps each.  Sit to stand without UE support (arms outstretched) x 15 reps Gait in // bars - Toe walk down and heel walk back x 6 rounds Standing hip flex into ext- Hip swings- 25 reps each Forward lunge walk down and back in // bars x 6 rounds    Education provided throughout session via VC/TC and demonstration to facilitate movement at target joints and correct muscle activation for all testing and exercises performed.     PATIENT EDUCATION: Education details: Exercise technique, instruction in use of AD (see note for details) Person educated: Patient Education method: Explanation, Demonstration, Tactile cues, and Verbal cues Education comprehension: verbalized understanding, returned demonstration, verbal cues required, tactile cues required, and needs further education   HOME EXERCISE PROGRAM: No updates today   PT Short Term Goals -       PT SHORT TERM GOAL #1   Title Pt will be independent with INITIAL  HEP in order to improve strength and balance in order to decrease fall risk  and improve function at home and work.    Baseline 04/07/2021= No formal HEP in place. 05/11/2021= Patient reports doing pretty good- compliant with walking program and his stretching/strengthening.    Time 6    Period Weeks    Status Achieved    Target Date 05/19/21      PT SHORT TERM GOAL #  2   Title Pt will decrease 5TSTS by at least 3 seconds in order to demonstrate clinically significant improvement in LE strength.    Baseline 04/07/2021= 25 sec; 05/11/2021= 18.65 sec    Time 6    Period Weeks    Status Achieved    Target Date 06/30/21              PT Long Term Goals -       PT LONG TERM GOAL #1   Title Pt will be independent with FINAL HEP in order to improve strength and balance in order to decrease fall risk and improve function at home and work.    Baseline 04/07/2021= No formal HEP in place. 07/01/2021=Patient reports performing walking and some standing LE strengthening exercises as instructed and states no questions at this time.  09/02/21: Pt reports he feels indep with exercises but is not performing HEP as much as he should   Time 12    Period Weeks    Status Partially Met    Target Date 09/23/21      PT LONG TERM GOAL #2   Title Pt will improve FOTO to target score of 45% to display perceived improvements in ability to complete ADL's.    Baseline 2/7/22023= 41%; FOTO=53%    Time 12    Period Weeks    Status Achieved    Target Date 06/30/21      PT LONG TERM GOAL #3   Title Pt will decrease 5TSTS by at least 5 seconds in order to demonstrate clinically significant improvement in LE strength.    Baseline 04/07/2021= 25 sec; 05/11/2021= 18.65 sec without UE support; 4/10= 13.76 sec without UE support    Time 12    Period Weeks    Status Achieved    Target Date 06/30/21      PT LONG TERM GOAL #4   Title Pt will decrease TUG to below 17 seconds/decrease in order to demonstrate decreased fall risk.    Baseline 04/07/2021= 21 sec with SPC; 05/11/2021= 18.85 sec with use of  cane; 07/21/2021=18.88 sec  avg with use of cane; 7/5: 22.85 sec with QC; 09/29/2021= 21.45 sec using upright walker   Time 12    Period Weeks    Status Partially Met    Target Date 12/15/2021     PT LONG TERM GOAL #5   Title Pt will increase 10MWT by at least 0.15 m/s in order to demonstrate clinically significant improvement in community ambulation.    Baseline 04/07/2021= 0.56 m/s using SPC; 05/11/2021= 0.54 m/s using SPC; 07/01/2021= 0.6 m/s/ 7/5: 0.48 m/s without AD but with CGA; 09/29/2021= 0.64 m/s   Time 12    Period Weeks    Status On-going    Target Date 12/15/2021     PT LONG TERM GOAL #6   Title Pt will increase 6MWT by at least 69m(1626f in order to demonstrate clinically significant improvement in cardiopulmonary endurance and community ambulation    Baseline 06/08/2021= 600 ft. in 5 min- Stopped due to fatigue using 4WW; 07/01/2021= 665 in 5 min 45 sec using 4WW; 7/5: 367 ft with QC; 09/29/2021= 740 feet using upright 4WW   Time 4    Period Weeks    Status On-going    Target Date 12/15/2021             Plan - 08/20/21 0939     Clinical Impression Statement  Patient initially challenged with dynamic standing balance with difficulty  advancing LE forward without shuffling. He vastly improved with practice and performed well with forward and side steps and even heel/toe walk without shuffling with min UE support. Pt to continue with skilled Physical Therapy to address remaining goals and deficits.    Personal Factors and Comorbidities Comorbidity 3+    Comorbidities arthritis, bladder cancer, Trigeminal Neuralgia    Examination-Activity Limitations Caring for Others;Carry;Continence;Lift;Squat;Stairs;Stand    Examination-Participation Restrictions Community Activity;Yard Work    [employer] Evolving/Moderate complexity    Rehab Potential Good    PT Frequency 2x / week    PT Duration 12 weeks    PT Treatment/Interventions ADLs/Self Care Home  Management;Cryotherapy;Canalith Repostioning;Electrical Stimulation;Moist Heat;DME Instruction;Gait training;Stair training;Functional mobility training;Therapeutic activities;Therapeutic exercise;Balance training;Neuromuscular re-education;Patient/family education;Manual techniques;Passive range of motion;Dry needling;Vestibular    PT Next Visit Plan LE strength, improve step length with ambulation, muscle tissue lengthening    PT Home Exercise Plan Access Code: [email or web address]               Ollen Bowl, PT 10/20/21, 11:36 AM  Physical Therapist - [place] [phone]

## 2021-10-22 ENCOUNTER — Ambulatory Visit: Payer: Medicare HMO

## 2021-10-22 DIAGNOSIS — R2689 Other abnormalities of gait and mobility: Secondary | ICD-10-CM

## 2021-10-22 DIAGNOSIS — R278 Other lack of coordination: Secondary | ICD-10-CM

## 2021-10-22 DIAGNOSIS — R269 Unspecified abnormalities of gait and mobility: Secondary | ICD-10-CM

## 2021-10-22 DIAGNOSIS — R2681 Unsteadiness on feet: Secondary | ICD-10-CM

## 2021-10-22 DIAGNOSIS — R262 Difficulty in walking, not elsewhere classified: Secondary | ICD-10-CM | POA: Diagnosis not present

## 2021-10-22 DIAGNOSIS — M6281 Muscle weakness (generalized): Secondary | ICD-10-CM

## 2021-10-22 DIAGNOSIS — G8929 Other chronic pain: Secondary | ICD-10-CM

## 2021-10-22 NOTE — Therapy (Signed)
OUTPATIENT PHYSICAL THERAPY TREATMENT NOTE        Patient Name: [name] MRN: [id number] DOB:February 18, 1946, 75 y.o., male Today's Date: 10/22/2021  PCP: [name]. [name], [place] PROVIDER: Dr. Gurney Maxin   PT End of Session - 10/22/21 0934     Visit Number 4    Number of Visits 34    Date for PT Re-Evaluation 12/15/21    Authorization Type [employer]    Authorization Time Period 07/01/21-09/20/21    Progress Note Due on Visit 60    PT Start Time 0930    PT Stop Time 1014    PT Time Calculation (min) 44 min    Equipment Utilized During Treatment Gait belt    Activity Tolerance Patient tolerated treatment well;No increased pain    Behavior During Therapy [facility] for tasks assessed/performed                   Past Medical History:  Diagnosis Date   Anxiety    Arteriosclerosis of coronary artery 11/16/2011   Overview:  Stent 10/2011 stent rca 2015 with collaterals to lad which is chronically occluded    Benign enlargement of prostate    Benign essential HTN 06/11/2014   Benign prostatic hypertrophy without urinary obstruction 07/31/2014   Bilateral cataracts 05/16/2013   Overview:  Dr. [facility]     Bone spur of foot    Left   BP (high blood pressure) 11/09/2012   Cancer ([place])    skin (forehead) and bladder   Carotid artery narrowing 02/08/2014   Depression    Detrusor hypertrophy    Diabetes ([place])    Diabetes mellitus, type 2 ([place]) 12/12/2012   Diverticulosis    Dyspnea    Esophageal reflux    Esophageal reflux    Fothergill's neuralgia 08/07/2012   Overview:  [facility]    Gastritis    GERD (gastroesophageal reflux disease)    Headache    cluster headaches   Healed myocardial infarct 11/09/2012   Hearing loss in left ear    Heart disease    Hematuria    Hemorrhoids    History of hiatal hernia 12/14/2017   small    Hypercholesteremia    Lesion of bladder    Myocardial infarct (HCC)    Presence of stent in coronary artery  11/09/2012   Rectal bleeding    Trigeminal neuralgia    Trigeminal neuralgia    Valvular heart disease    Vitamin D deficiency    Past Surgical History:  Procedure Laterality Date   APPENDECTOMY     BOTOX INJECTION N/A 12/21/2017   Procedure: Bladder BOTOX INJECTION;  Surgeon: [name], MD;  Location: [facility];  Service: Urology;  Laterality: N/A;   BOTOX INJECTION N/A 09/11/2018   Procedure: Bladder BOTOX INJECTION;  Surgeon: [name], MD;  Location: [facility];  Service: Urology;  Laterality: N/A;   CARDIAC CATHETERIZATION     CARDIAC CATHETERIZATION N/A 01/22/2015   Procedure: Left Heart Cath;  Surgeon: Corey Skains, MD;  Location: [place] [facility];  Service: Cardiovascular;  Laterality: N/A;   CARDIAC CATHETERIZATION N/A 01/22/2015   Procedure: Coronary Stent Intervention;  Surgeon: Isaias Cowman, MD;  Location: [place] CV [facility];  Service: Cardiovascular;  Laterality: N/A;   CATARACT EXTRACTION, BILATERAL     COLONOSCOPY WITH PROPOFOL N/A 12/08/2015   Procedure: COLONOSCOPY WITH PROPOFOL;  Surgeon: Lollie Sails, MD;  Location: [facility]COPY;  Service: Endoscopy;  Laterality: N/A;  COLONOSCOPY WITH PROPOFOL N/A 12/09/2015   Procedure: COLONOSCOPY WITH PROPOFOL;  Surgeon: Lollie Sails, MD;  Location: [facility] ENDOSCOPY;  Service: Endoscopy;  Laterality: N/A;   CORONARY ANGIOPLASTY     5 stents   CORONARY STENT PLACEMENT  2015   x5   CYSTOSCOPY N/A 09/11/2018   Procedure: CYSTOSCOPY;  Surgeon: [name], MD;  Location: [facility];  Service: Urology;  Laterality: N/A;   CYSTOSCOPY WITH BIOPSY N/A 12/21/2017   Procedure: CYSTOSCOPY WITH BIOPSY;  Surgeon: [name], MD;  Location: [facility];  Service: Urology;  Laterality: N/A;   ESOPHAGOGASTRODUODENOSCOPY (EGD) WITH PROPOFOL N/A 12/08/2015   Procedure: ESOPHAGOGASTRODUODENOSCOPY (EGD) WITH PROPOFOL;  Surgeon: Lollie Sails, MD;  Location: [facility] ENDOSCOPY;  Service: Endoscopy;  Laterality:  N/A;   ESOPHAGOGASTRODUODENOSCOPY (EGD) WITH PROPOFOL N/A 12/13/2017   Procedure: ESOPHAGOGASTRODUODENOSCOPY (EGD) WITH PROPOFOL;  Surgeon: Lollie Sails, MD;  Location: [facility] ENDOSCOPY;  Service: Endoscopy;  Laterality: N/A;   EYE SURGERY     HERNIA REPAIR     kidney tumor remove     TRANSURETHRAL RESECTION OF BLADDER TUMOR WITH GYRUS (TURBT-GYRUS)  84/1324   UMBILICAL HERNIA REPAIR     urethral meatotomy     Patient Active Problem List   Diagnosis Date Noted   Class 2 obesity due to excess calories with body mass index (BMI) of 36.0 to 36.9 in adult 04/11/2020   Swelling of limb 03/28/2020   Lymphedema 03/28/2020   Trigeminal neuralgia 12/25/2019   Lower limb ulcer, calf, left, limited to breakdown of skin ([place]) 12/25/2019   OSA (obstructive sleep apnea) 40/11/2723   Acute systolic CHF (congestive heart failure) ([place]) 12/12/2018   Bruising 07/10/2018   Diet-controlled type 2 diabetes mellitus ([place]) 03/24/2018   Microalbuminuria 03/24/2018   Dizziness 11/17/2017   Simple chronic bronchitis ([place]) 09/22/2017   SOBOE (shortness of breath on exertion) 02/18/2017   Chronic midline low back pain without sciatica 12/29/2016   Periodic limb movement disorder 12/29/2016   Benign essential tremor 06/22/2016   Pure hypercholesterolemia 06/20/2015   Erectile dysfunction due to arterial insufficiency 06/16/2015   Unstable angina ([place]) 01/22/2015   Abdominal aortic 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 rupture ([place]) 12/31/2014   Benign prostatic hypertrophy without urinary obstruction 07/31/2014   Enlarged prostate 07/31/2014   Benign essential HTN 06/11/2014   Carotid artery narrowing 02/08/2014   Carotid artery obstruction 02/08/2014   Bilateral carotid artery stenosis 02/08/2014   Chest pain 08/20/2013   Bilateral cataracts 05/16/2013   Cataract 05/16/2013   Clinical depression 12/12/2012   Diabetes mellitus, type 2 ([place]) 12/12/2012   Combined fat and carbohydrate induced hyperlipemia  12/12/2012   Major depressive disorder, single episode, unspecified 12/12/2012   Acid reflux 11/09/2012   Presence of stent in coronary artery 11/09/2012   BP (high blood pressure) 11/09/2012   Healed myocardial infarct 11/09/2012   Gastro-esophageal reflux disease without esophagitis 11/09/2012   History of cardiovascular surgery 11/09/2012   Fothergill's neuralgia 08/07/2012   Swelling of testicle 04/06/2012   Disorder of male genital organ 04/06/2012   Swelling of the testicles 04/06/2012   Arteriosclerosis of coronary artery 11/16/2011   CAD in native artery 11/16/2011   Fatigue 06/04/2011   Avitaminosis D 11/30/2010    REFERRING DIAG: Repeated falls   THERAPY DIAG:   Difficulty in walking, not elsewhere classified  Muscle weakness (generalized)  Abnormality of gait and mobility  Other abnormalities of gait and mobility  Other lack of coordination  Unsteadiness on feet  Chronic bilateral low back  pain without sciatica  Rationale for Evaluation and Treatment Rehabilitation   Subjective Assessment - 09/22/21 0936     Subjective  Patient reports MD changed his Parkinson's med and no new issues.    Patient is accompained by: Family member -Wife   Pertinent History Patient is a 76 year old male with referral for Physical Therapy with diagnosis of Parkinsons disease and history of falling. He reports he diagnosed last year with Parkinsons and has improved his tremors with use of medication but reports worsening shuffling and difficulty with mobility. Patient has past medical history signifiant for Parkinsons; Arthritis, bladder cancer, Trigemic Neuralgia. He lives with wife in 1 level home and current using cane with reported recent fall.    Limitations Walking;Lifting;Standing;House hold activities    How long can you sit comfortably? No limits    How long can you stand comfortably? 15 min- limited due to back pain and foot numbess    How long can you walk comfortably?  about 80 feet    Patient Stated Goals Improve my balance, be able to get up off the floor so I can maybe work in my garage again.    Currently in Pain? No/denies               PRECAUTIONS: Falls   PAIN:  No pain reported.    TODAY'S TREATMENT:   Therex:  Sit to stand without UE support (arms outstretched holding onto 2.2 kg  ball) x 15 reps Seated hip march with 3lb AW alt LE x 15 reps Forward step up with BUE support x 12 reps  Neuromuscular re-ed  Forward/backward - Stepping over 1/2 foam roll with 3lb AW x 20 reps Side stepping over 1/2 foam roll 3lb AW x 20 reps. Near tandem walking in // bars- 3lb AW- down and back x 5 rounds.  Staggered stand with arms straight and outstretched to around 90 deg abd- trunk rotation x 15 reps each.   Gait in // bars - Toe walk down and heel walk back x 6 rounds Standing hip flex into ext- Hip swings- 20 reps each Forward lunge walk down and back in // bars x 6 rounds    Education provided throughout session via VC/TC and demonstration to facilitate movement at target joints and correct muscle activation for all testing and exercises performed.     PATIENT EDUCATION: Education details: Exercise technique, instruction in use of AD (see note for details) Person educated: Patient Education method: Explanation, Demonstration, Tactile cues, and Verbal cues Education comprehension: verbalized understanding, returned demonstration, verbal cues required, tactile cues required, and needs further education   HOME EXERCISE PROGRAM: No updates today   PT Short Term Goals -       PT SHORT TERM GOAL #1   Title Pt will be independent with INITIAL  HEP in order to improve strength and balance in order to decrease fall risk and improve function at home and work.    Baseline 04/07/2021= No formal HEP in place. 05/11/2021= Patient reports doing pretty good- compliant with walking program and his stretching/strengthening.    Time 6    Period  Weeks    Status Achieved    Target Date 05/19/21      PT SHORT TERM GOAL #2   Title Pt will decrease 5TSTS by at least 3 seconds in order to demonstrate clinically significant improvement in LE strength.    Baseline 04/07/2021= 25 sec; 05/11/2021= 18.65 sec    Time 6  Period Weeks    Status Achieved    Target Date 06/30/21              PT Long Term Goals -       PT LONG TERM GOAL #1   Title Pt will be independent with FINAL HEP in order to improve strength and balance in order to decrease fall risk and improve function at home and work.    Baseline 04/07/2021= No formal HEP in place. 07/01/2021=Patient reports performing walking and some standing LE strengthening exercises as instructed and states no questions at this time.  09/02/21: Pt reports he feels indep with exercises but is not performing HEP as much as he should   Time 12    Period Weeks    Status Partially Met    Target Date 09/23/21      PT LONG TERM GOAL #2   Title Pt will improve FOTO to target score of 45% to display perceived improvements in ability to complete ADL's.    Baseline 2/7/22023= 41%; FOTO=53%    Time 12    Period Weeks    Status Achieved    Target Date 06/30/21      PT LONG TERM GOAL #3   Title Pt will decrease 5TSTS by at least 5 seconds in order to demonstrate clinically significant improvement in LE strength.    Baseline 04/07/2021= 25 sec; 05/11/2021= 18.65 sec without UE support; 4/10= 13.76 sec without UE support    Time 12    Period Weeks    Status Achieved    Target Date 06/30/21      PT LONG TERM GOAL #4   Title Pt will decrease TUG to below 17 seconds/decrease in order to demonstrate decreased fall risk.    Baseline 04/07/2021= 21 sec with SPC; 05/11/2021= 18.85 sec with use of cane; 07/21/2021=18.88 sec  avg with use of cane; 7/5: 22.85 sec with QC; 09/29/2021= 21.45 sec using upright walker   Time 12    Period Weeks    Status Partially Met    Target Date 12/15/2021     PT LONG TERM GOAL #5    Title Pt will increase 10MWT by at least 0.15 m/s in order to demonstrate clinically significant improvement in community ambulation.    Baseline 04/07/2021= 0.56 m/s using SPC; 05/11/2021= 0.54 m/s using SPC; 07/01/2021= 0.6 m/s/ 7/5: 0.48 m/s without AD but with CGA; 09/29/2021= 0.64 m/s   Time 12    Period Weeks    Status On-going    Target Date 12/15/2021     PT LONG TERM GOAL #6   Title Pt will increase 6MWT by at least 35m(1678f in order to demonstrate clinically significant improvement in cardiopulmonary endurance and community ambulation    Baseline 06/08/2021= 600 ft. in 5 min- Stopped due to fatigue using 4WW; 07/01/2021= 665 in 5 min 45 sec using 4WW; 7/5: 367 ft with QC; 09/29/2021= 740 feet using upright 4WW   Time 4    Period Weeks    Status On-going    Target Date 12/15/2021             Plan - 08/20/21 0939     Clinical Impression Statement  Patient presents with good overall ability with increased step length today. He presented with good overall endurance with less rest breaks and no LOB yet some unsteadiness with near tandem.  Pt to continue with skilled Physical Therapy to address remaining goals and deficits.    Personal Factors and  Comorbidities Comorbidity 3+    Comorbidities arthritis, bladder cancer, Trigeminal Neuralgia    Examination-Activity Limitations Caring for Others;Carry;Continence;Lift;Squat;Stairs;Stand    Examination-Participation Restrictions Community Activity;Yard Work    [employer] Evolving/Moderate complexity    Rehab Potential Good    PT Frequency 2x / week    PT Duration 12 weeks    PT Treatment/Interventions ADLs/Self Care Home Management;Cryotherapy;Canalith Repostioning;Electrical Stimulation;Moist Heat;DME Instruction;Gait training;Stair training;Functional mobility training;Therapeutic activities;Therapeutic exercise;Balance training;Neuromuscular re-education;Patient/family education;Manual techniques;Passive range of  motion;Dry needling;Vestibular    PT Next Visit Plan LE strength, improve step length with ambulation, muscle tissue lengthening    PT Home Exercise Plan Access Code: [email or web address]               Ollen Bowl, PT 10/22/21, 11:16 AM  Physical Therapist - [place] [phone]

## 2021-10-27 ENCOUNTER — Ambulatory Visit: Payer: Medicare HMO

## 2021-10-27 DIAGNOSIS — R269 Unspecified abnormalities of gait and mobility: Secondary | ICD-10-CM

## 2021-10-27 DIAGNOSIS — R2689 Other abnormalities of gait and mobility: Secondary | ICD-10-CM

## 2021-10-27 DIAGNOSIS — R262 Difficulty in walking, not elsewhere classified: Secondary | ICD-10-CM

## 2021-10-27 DIAGNOSIS — R2681 Unsteadiness on feet: Secondary | ICD-10-CM

## 2021-10-27 DIAGNOSIS — M6281 Muscle weakness (generalized): Secondary | ICD-10-CM

## 2021-10-27 DIAGNOSIS — R278 Other lack of coordination: Secondary | ICD-10-CM

## 2021-10-27 NOTE — Therapy (Signed)
OUTPATIENT PHYSICAL THERAPY TREATMENT NOTE        Patient Name: [name] MRN: [id number] DOB:04-05-1945, 76 y.o., male Today's Date: 10/27/2021  PCP: [name]. [name], [place] PROVIDER: Dr. Gurney Maxin   PT End of Session - 10/27/21 0916     Visit Number 55    Number of Visits 71    Date for PT Re-Evaluation 12/15/21    Authorization Type [employer]    Authorization Time Period 07/01/21-09/20/21    Progress Note Due on Visit 84    PT Start Time 0925    PT Stop Time 1010    PT Time Calculation (min) 45 min    Equipment Utilized During Treatment Gait belt    Activity Tolerance Patient tolerated treatment well;No increased pain    Behavior During Therapy [facility] for tasks assessed/performed                   Past Medical History:  Diagnosis Date   Anxiety    Arteriosclerosis of coronary artery 11/16/2011   Overview:  Stent 10/2011 stent rca 2015 with collaterals to lad which is chronically occluded    Benign enlargement of prostate    Benign essential HTN 06/11/2014   Benign prostatic hypertrophy without urinary obstruction 07/31/2014   Bilateral cataracts 05/16/2013   Overview:  Dr. [facility]     Bone spur of foot    Left   BP (high blood pressure) 11/09/2012   Cancer ([place])    skin (forehead) and bladder   Carotid artery narrowing 02/08/2014   Depression    Detrusor hypertrophy    Diabetes ([place])    Diabetes mellitus, type 2 ([place]) 12/12/2012   Diverticulosis    Dyspnea    Esophageal reflux    Esophageal reflux    Fothergill's neuralgia 08/07/2012   Overview:  [facility]    Gastritis    GERD (gastroesophageal reflux disease)    Headache    cluster headaches   Healed myocardial infarct 11/09/2012   Hearing loss in left ear    Heart disease    Hematuria    Hemorrhoids    History of hiatal hernia 12/14/2017   small    Hypercholesteremia    Lesion of bladder    Myocardial infarct (HCC)    Presence of stent in coronary artery  11/09/2012   Rectal bleeding    Trigeminal neuralgia    Trigeminal neuralgia    Valvular heart disease    Vitamin D deficiency    Past Surgical History:  Procedure Laterality Date   APPENDECTOMY     BOTOX INJECTION N/A 12/21/2017   Procedure: Bladder BOTOX INJECTION;  Surgeon: [name], MD;  Location: [facility] ORS;  Service: Urology;  Laterality: N/A;   BOTOX INJECTION N/A 09/11/2018   Procedure: Bladder BOTOX INJECTION;  Surgeon: [name], MD;  Location: [facility] ORS;  Service: Urology;  Laterality: N/A;   CARDIAC CATHETERIZATION     CARDIAC CATHETERIZATION N/A 01/22/2015   Procedure: Left Heart Cath;  Surgeon: Corey Skains, MD;  Location: [place] CV LAB;  Service: Cardiovascular;  Laterality: N/A;   CARDIAC CATHETERIZATION N/A 01/22/2015   Procedure: Coronary Stent Intervention;  Surgeon: Isaias Cowman, MD;  Location: [place] CV [facility];  Service: Cardiovascular;  Laterality: N/A;   CATARACT EXTRACTION, BILATERAL     COLONOSCOPY WITH PROPOFOL N/A 12/08/2015   Procedure: COLONOSCOPY WITH PROPOFOL;  Surgeon: Lollie Sails, MD;  Location: [facility] ENDOSCOPY;  Service: Endoscopy;  Laterality: N/A;  COLONOSCOPY WITH PROPOFOL N/A 12/09/2015   Procedure: COLONOSCOPY WITH PROPOFOL;  Surgeon: Lollie Sails, MD;  Location: [facility] ENDOSCOPY;  Service: Endoscopy;  Laterality: N/A;   CORONARY ANGIOPLASTY     5 stents   CORONARY STENT PLACEMENT  2015   x5   CYSTOSCOPY N/A 09/11/2018   Procedure: CYSTOSCOPY;  Surgeon: [name], MD;  Location: [facility] ORS;  Service: Urology;  Laterality: N/A;   CYSTOSCOPY WITH BIOPSY N/A 12/21/2017   Procedure: CYSTOSCOPY WITH BIOPSY;  Surgeon: [name], MD;  Location: [facility] ORS;  Service: Urology;  Laterality: N/A;   ESOPHAGOGASTRODUODENOSCOPY (EGD) WITH PROPOFOL N/A 12/08/2015   Procedure: ESOPHAGOGASTRODUODENOSCOPY (EGD) WITH PROPOFOL;  Surgeon: Lollie Sails, MD;  Location: [facility] ENDOSCOPY;  Service: Endoscopy;  Laterality:  N/A;   ESOPHAGOGASTRODUODENOSCOPY (EGD) WITH PROPOFOL N/A 12/13/2017   Procedure: ESOPHAGOGASTRODUODENOSCOPY (EGD) WITH PROPOFOL;  Surgeon: Lollie Sails, MD;  Location: [facility] ENDOSCOPY;  Service: Endoscopy;  Laterality: N/A;   EYE SURGERY     HERNIA REPAIR     kidney tumor remove     TRANSURETHRAL RESECTION OF BLADDER TUMOR WITH GYRUS (TURBT-GYRUS)  81/8563   UMBILICAL HERNIA REPAIR     urethral meatotomy     Patient Active Problem List   Diagnosis Date Noted   Class 2 obesity due to excess calories with body mass index (BMI) of 36.0 to 36.9 in adult 04/11/2020   Swelling of limb 03/28/2020   Lymphedema 03/28/2020   Trigeminal neuralgia 12/25/2019   Lower limb ulcer, calf, left, limited to breakdown of skin ([place]) 12/25/2019   OSA (obstructive sleep apnea) 14/97/0263   Acute systolic CHF (congestive heart failure) ([place]) 12/12/2018   Bruising 07/10/2018   Diet-controlled type 2 diabetes mellitus ([place]) 03/24/2018   Microalbuminuria 03/24/2018   Dizziness 11/17/2017   Simple chronic bronchitis ([place]) 09/22/2017   SOBOE (shortness of breath on exertion) 02/18/2017   Chronic midline low back pain without sciatica 12/29/2016   Periodic limb movement disorder 12/29/2016   Benign essential tremor 06/22/2016   Pure hypercholesterolemia 06/20/2015   Erectile dysfunction due to arterial insufficiency 06/16/2015   Unstable angina ([place]) 01/22/2015   Abdominal aortic 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 rupture ([place]) 12/31/2014   Benign prostatic hypertrophy without urinary obstruction 07/31/2014   Enlarged prostate 07/31/2014   Benign essential HTN 06/11/2014   Carotid artery narrowing 02/08/2014   Carotid artery obstruction 02/08/2014   Bilateral carotid artery stenosis 02/08/2014   Chest pain 08/20/2013   Bilateral cataracts 05/16/2013   Cataract 05/16/2013   Clinical depression 12/12/2012   Diabetes mellitus, type 2 ([employer]) 12/12/2012   Combined fat and carbohydrate induced hyperlipemia  12/12/2012   Major depressive disorder, single episode, unspecified 12/12/2012   Acid reflux 11/09/2012   Presence of stent in coronary artery 11/09/2012   BP (high blood pressure) 11/09/2012   Healed myocardial infarct 11/09/2012   Gastro-esophageal reflux disease without esophagitis 11/09/2012   History of cardiovascular surgery 11/09/2012   Fothergill's neuralgia 08/07/2012   Swelling of testicle 04/06/2012   Disorder of male genital organ 04/06/2012   Swelling of the testicles 04/06/2012   Arteriosclerosis of coronary artery 11/16/2011   CAD in native artery 11/16/2011   Fatigue 06/04/2011   Avitaminosis D 11/30/2010    REFERRING DIAG: Repeated falls   THERAPY DIAG:   Difficulty in walking, not elsewhere classified  Muscle weakness (generalized)  Abnormality of gait and mobility  Other abnormalities of gait and mobility  Other lack of coordination  Unsteadiness on feet  Rationale for Evaluation and  Treatment Rehabilitation   Subjective Assessment - 09/22/21 0936     Subjective Pt reports having a difficult day yesterday with his mobility. Has improved today. Denies falls.    Patient is accompained by: Family member -Wife   Pertinent History Patient is a 76 year old male with referral for Physical Therapy with diagnosis of Parkinsons disease and history of falling. He reports he diagnosed last year with Parkinsons and has improved his tremors with use of medication but reports worsening shuffling and difficulty with mobility. Patient has past medical history signifiant for Parkinsons; Arthritis, bladder cancer, Trigemic Neuralgia. He lives with wife in 1 level home and current using cane with reported recent fall.    Limitations Walking;Lifting;Standing;House hold activities    How long can you sit comfortably? No limits    How long can you stand comfortably? 15 min- limited due to back pain and foot numbess    How long can you walk comfortably? about 80 feet    Patient  Stated Goals Improve my balance, be able to get up off the floor so I can maybe work in my garage again.    Currently in Pain? No/denies               PRECAUTIONS: Falls   PAIN:  No pain reported. R calf pain earlier. No back pain today.    TODAY'S TREATMENT: 10/27/21  Therex:  Sit to stand without UE support (arms outstretched holding onto 2.2 kg  ball) 2x12  Seated hip march with 3lb AW alt LE 2x12  Seated LAQ: 2x12/LE  Forward step up with BUE support 2x10/LE. UE support and CGA.  Education on R gastroc stretch in sitting with belt and knee extension. 1x45 seconds    Neuromuscular re-ed  Forward/backward - Stepping over 2x 1/2 bolsters  in // bars: x6 down and back. 4 lb AW's.  Side stepping over  2x1/2 bolsters 4lb AW x 8 down and back in // bars. Mod to mx VC's for maintaining torso in frontal plane especially stepping to the L.   Backwards gait with 4# AW's and BUE support in // bars for hip extension strength. X5 laps down and back  Side steps with 4# AW's and light BUE support in // bars for hip abduction strength. X5 laps down and back. Mod VC's for L hip positioning with L side steps.   Education provided throughout session via VC/TC and demonstration to facilitate movement at target joints and correct muscle activation for all testing and exercises performed.     PATIENT EDUCATION: Education details: form/technique with exercise Person educated: Patient Education method: Explanation, Demonstration, Tactile cues, and Verbal cues Education comprehension: verbalized understanding, returned demonstration, verbal cues required, tactile cues required, and needs further education   HOME EXERCISE PROGRAM: No updates today   PT Short Term Goals -       PT SHORT TERM GOAL #1   Title Pt will be independent with INITIAL  HEP in order to improve strength and balance in order to decrease fall risk and improve function at home and work.    Baseline 04/07/2021=  No formal HEP in place. 05/11/2021= Patient reports doing pretty good- compliant with walking program and his stretching/strengthening.    Time 6    Period Weeks    Status Achieved    Target Date 05/19/21      PT SHORT TERM GOAL #2   Title Pt will decrease 5TSTS by at least 3 seconds in order to demonstrate  clinically significant improvement in LE strength.    Baseline 04/07/2021= 25 sec; 05/11/2021= 18.65 sec    Time 6    Period Weeks    Status Achieved    Target Date 06/30/21              PT Long Term Goals -       PT LONG TERM GOAL #1   Title Pt will be independent with FINAL HEP in order to improve strength and balance in order to decrease fall risk and improve function at home and work.    Baseline 04/07/2021= No formal HEP in place. 07/01/2021=Patient reports performing walking and some standing LE strengthening exercises as instructed and states no questions at this time.  09/02/21: Pt reports he feels indep with exercises but is not performing HEP as much as he should   Time 12    Period Weeks    Status Partially Met    Target Date 09/23/21      PT LONG TERM GOAL #2   Title Pt will improve FOTO to target score of 45% to display perceived improvements in ability to complete ADL's.    Baseline 2/7/22023= 41%; FOTO=53%    Time 12    Period Weeks    Status Achieved    Target Date 06/30/21      PT LONG TERM GOAL #3   Title Pt will decrease 5TSTS by at least 5 seconds in order to demonstrate clinically significant improvement in LE strength.    Baseline 04/07/2021= 25 sec; 05/11/2021= 18.65 sec without UE support; 4/10= 13.76 sec without UE support    Time 12    Period Weeks    Status Achieved    Target Date 06/30/21      PT LONG TERM GOAL #4   Title Pt will decrease TUG to below 17 seconds/decrease in order to demonstrate decreased fall risk.    Baseline 04/07/2021= 21 sec with SPC; 05/11/2021= 18.85 sec with use of cane; 07/21/2021=18.88 sec  avg with use of cane; 7/5: 22.85 sec  with QC; 09/29/2021= 21.45 sec using upright walker   Time 12    Period Weeks    Status Partially Met    Target Date 12/15/2021     PT LONG TERM GOAL #5   Title Pt will increase 10MWT by at least 0.15 m/s in order to demonstrate clinically significant improvement in community ambulation.    Baseline 04/07/2021= 0.56 m/s using SPC; 05/11/2021= 0.54 m/s using SPC; 07/01/2021= 0.6 m/s/ 7/5: 0.48 m/s without AD but with CGA; 09/29/2021= 0.64 m/s   Time 12    Period Weeks    Status On-going    Target Date 12/15/2021     PT LONG TERM GOAL #6   Title Pt will increase 6MWT by at least 46m(163f in order to demonstrate clinically significant improvement in cardiopulmonary endurance and community ambulation    Baseline 06/08/2021= 600 ft. in 5 min- Stopped due to fatigue using 4WW; 07/01/2021= 665 in 5 min 45 sec using 4WW; 7/5: 367 ft with QC; 09/29/2021= 740 feet using upright 4WW   Time 4    Period Weeks    Status On-going    Target Date 12/15/2021             Plan - 08/20/21 0939     Clinical Impression Statement Continuing PT POC with focus on consistent step lengths forwards, backwards, and laterally. Pt tolerating progression of increased resistance and repetitions with limited seated rest breaks required.  Noticeable glut med weakness with LLE with decreased step lengths laterally and hip compensations. Pt to continue with skilled Physical Therapy to address remaining goals and deficits.   Personal Factors and Comorbidities Comorbidity 3+    Comorbidities arthritis, bladder cancer, Trigeminal Neuralgia    Examination-Activity Limitations Caring for Others;Carry;Continence;Lift;Squat;Stairs;Stand    Examination-Participation Restrictions Community Activity;Yard Work    [employer] Evolving/Moderate complexity    Rehab Potential Good    PT Frequency 2x / week    PT Duration 12 weeks    PT Treatment/Interventions ADLs/Self Care Home Management;Cryotherapy;Canalith  Repostioning;Electrical Stimulation;Moist Heat;DME Instruction;Gait training;Stair training;Functional mobility training;Therapeutic activities;Therapeutic exercise;Balance training;Neuromuscular re-education;Patient/family education;Manual techniques;Passive range of motion;Dry needling;Vestibular    PT Next Visit Plan LE strength, improve step length with ambulation, muscle tissue lengthening    PT Home Exercise Plan Access Code: [email or web address]               Salem Caster. Fairly IV, PT, DPT Physical Therapist- [facility]  10/27/21, 10:59 AM

## 2021-10-29 ENCOUNTER — Ambulatory Visit: Payer: Medicare HMO

## 2021-10-29 DIAGNOSIS — M6281 Muscle weakness (generalized): Secondary | ICD-10-CM

## 2021-10-29 DIAGNOSIS — R262 Difficulty in walking, not elsewhere classified: Secondary | ICD-10-CM

## 2021-10-29 DIAGNOSIS — R2681 Unsteadiness on feet: Secondary | ICD-10-CM

## 2021-10-29 DIAGNOSIS — R2689 Other abnormalities of gait and mobility: Secondary | ICD-10-CM

## 2021-10-29 DIAGNOSIS — G8929 Other chronic pain: Secondary | ICD-10-CM

## 2021-10-29 DIAGNOSIS — M545 Low back pain, unspecified: Secondary | ICD-10-CM

## 2021-10-29 DIAGNOSIS — R269 Unspecified abnormalities of gait and mobility: Secondary | ICD-10-CM

## 2021-10-29 DIAGNOSIS — R278 Other lack of coordination: Secondary | ICD-10-CM

## 2021-10-29 NOTE — Therapy (Signed)
OUTPATIENT PHYSICAL THERAPY TREATMENT NOTE        Patient Name: [name] MRN: [id number] DOB:1946/01/20, 76 y.o., male Today's Date: 10/29/2021  PCP: [name]. [name], [place] PROVIDER: Dr. Gurney Maxin   PT End of Session - 10/29/21 1025     Visit Number 76    Number of Visits 27    Date for PT Re-Evaluation 12/15/21    Authorization Type [employer]    Authorization Time Period 07/01/21-09/20/21    Progress Note Due on Visit 87    PT Start Time 1018    PT Stop Time 1108    PT Time Calculation (min) 50 min    Equipment Utilized During Treatment Gait belt    Activity Tolerance Patient tolerated treatment well;No increased pain    Behavior During Therapy [facility] for tasks assessed/performed                   Past Medical History:  Diagnosis Date   Anxiety    Arteriosclerosis of coronary artery 11/16/2011   Overview:  Stent 10/2011 stent rca 2015 with collaterals to lad which is chronically occluded    Benign enlargement of prostate    Benign essential HTN 06/11/2014   Benign prostatic hypertrophy without urinary obstruction 07/31/2014   Bilateral cataracts 05/16/2013   Overview:  Dr. [facility]     Bone spur of foot    Left   BP (high blood pressure) 11/09/2012   Cancer ([place])    skin (forehead) and bladder   Carotid artery narrowing 02/08/2014   Depression    Detrusor hypertrophy    Diabetes ([place])    Diabetes mellitus, type 2 ([place]) 12/12/2012   Diverticulosis    Dyspnea    Esophageal reflux    Esophageal reflux    Fothergill's neuralgia 08/07/2012   Overview:  [facility]    Gastritis    GERD (gastroesophageal reflux disease)    Headache    cluster headaches   Healed myocardial infarct 11/09/2012   Hearing loss in left ear    Heart disease    Hematuria    Hemorrhoids    History of hiatal hernia 12/14/2017   small    Hypercholesteremia    Lesion of bladder    Myocardial infarct (HCC)    Presence of stent in coronary artery  11/09/2012   Rectal bleeding    Trigeminal neuralgia    Trigeminal neuralgia    Valvular heart disease    Vitamin D deficiency    Past Surgical History:  Procedure Laterality Date   APPENDECTOMY     BOTOX INJECTION N/A 12/21/2017   Procedure: Bladder BOTOX INJECTION;  Surgeon: [name], MD;  Location: [facility];  Service: Urology;  Laterality: N/A;   BOTOX INJECTION N/A 09/11/2018   Procedure: Bladder BOTOX INJECTION;  Surgeon: [name], MD;  Location: [facility];  Service: Urology;  Laterality: N/A;   CARDIAC CATHETERIZATION     CARDIAC CATHETERIZATION N/A 01/22/2015   Procedure: Left Heart Cath;  Surgeon: Corey Skains, MD;  Location: [place] [facility];  Service: Cardiovascular;  Laterality: N/A;   CARDIAC CATHETERIZATION N/A 01/22/2015   Procedure: Coronary Stent Intervention;  Surgeon: Isaias Cowman, MD;  Location: [place] [facility];  Service: Cardiovascular;  Laterality: N/A;   CATARACT EXTRACTION, BILATERAL     COLONOSCOPY WITH PROPOFOL N/A 12/08/2015   Procedure: COLONOSCOPY WITH PROPOFOL;  Surgeon: Lollie Sails, MD;  Location: [facility] ENDOSCOPY;  Service: Endoscopy;  Laterality: N/A;  COLONOSCOPY WITH PROPOFOL N/A 12/09/2015   Procedure: COLONOSCOPY WITH PROPOFOL;  Surgeon: Lollie Sails, MD;  Location: [facility] ENDOSCOPY;  Service: Endoscopy;  Laterality: N/A;   CORONARY ANGIOPLASTY     5 stents   CORONARY STENT PLACEMENT  2015   x5   CYSTOSCOPY N/A 09/11/2018   Procedure: CYSTOSCOPY;  Surgeon: [name], MD;  Location: [facility];  Service: Urology;  Laterality: N/A;   CYSTOSCOPY WITH BIOPSY N/A 12/21/2017   Procedure: CYSTOSCOPY WITH BIOPSY;  Surgeon: [name], MD;  Location: [facility];  Service: Urology;  Laterality: N/A;   ESOPHAGOGASTRODUODENOSCOPY (EGD) WITH PROPOFOL N/A 12/08/2015   Procedure: ESOPHAGOGASTRODUODENOSCOPY (EGD) WITH PROPOFOL;  Surgeon: Lollie Sails, MD;  Location: [facility] ENDOSCOPY;  Service: Endoscopy;  Laterality:  N/A;   ESOPHAGOGASTRODUODENOSCOPY (EGD) WITH PROPOFOL N/A 12/13/2017   Procedure: ESOPHAGOGASTRODUODENOSCOPY (EGD) WITH PROPOFOL;  Surgeon: Lollie Sails, MD;  Location: [facility] ENDOSCOPY;  Service: Endoscopy;  Laterality: N/A;   EYE SURGERY     HERNIA REPAIR     kidney tumor remove     TRANSURETHRAL RESECTION OF BLADDER TUMOR WITH GYRUS (TURBT-GYRUS)  84/1324   UMBILICAL HERNIA REPAIR     urethral meatotomy     Patient Active Problem List   Diagnosis Date Noted   Class 2 obesity due to excess calories with body mass index (BMI) of 36.0 to 36.9 in adult 04/11/2020   Swelling of limb 03/28/2020   Lymphedema 03/28/2020   Trigeminal neuralgia 12/25/2019   Lower limb ulcer, calf, left, limited to breakdown of skin ([place]) 12/25/2019   OSA (obstructive sleep apnea) 40/11/2723   Acute systolic CHF (congestive heart failure) ([place]) 12/12/2018   Bruising 07/10/2018   Diet-controlled type 2 diabetes mellitus ([place]) 03/24/2018   Microalbuminuria 03/24/2018   Dizziness 11/17/2017   Simple chronic bronchitis ([place]) 09/22/2017   SOBOE (shortness of breath on exertion) 02/18/2017   Chronic midline low back pain without sciatica 12/29/2016   Periodic limb movement disorder 12/29/2016   Benign essential tremor 06/22/2016   Pure hypercholesterolemia 06/20/2015   Erectile dysfunction due to arterial insufficiency 06/16/2015   Unstable angina ([place]) 01/22/2015   Abdominal aortic 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 rupture ([place]) 12/31/2014   Benign prostatic hypertrophy without urinary obstruction 07/31/2014   Enlarged prostate 07/31/2014   Benign essential HTN 06/11/2014   Carotid artery narrowing 02/08/2014   Carotid artery obstruction 02/08/2014   Bilateral carotid artery stenosis 02/08/2014   Chest pain 08/20/2013   Bilateral cataracts 05/16/2013   Cataract 05/16/2013   Clinical depression 12/12/2012   Diabetes mellitus, type 2 ([place]) 12/12/2012   Combined fat and carbohydrate induced hyperlipemia  12/12/2012   Major depressive disorder, single episode, unspecified 12/12/2012   Acid reflux 11/09/2012   Presence of stent in coronary artery 11/09/2012   BP (high blood pressure) 11/09/2012   Healed myocardial infarct 11/09/2012   Gastro-esophageal reflux disease without esophagitis 11/09/2012   History of cardiovascular surgery 11/09/2012   Fothergill's neuralgia 08/07/2012   Swelling of testicle 04/06/2012   Disorder of male genital organ 04/06/2012   Swelling of the testicles 04/06/2012   Arteriosclerosis of coronary artery 11/16/2011   CAD in native artery 11/16/2011   Fatigue 06/04/2011   Avitaminosis D 11/30/2010    REFERRING DIAG: Repeated falls   THERAPY DIAG:   Difficulty in walking, not elsewhere classified  Muscle weakness (generalized)  Abnormality of gait and mobility  Other abnormalities of gait and mobility  Other lack of coordination  Unsteadiness on feet  Chronic bilateral low back  pain without sciatica  Rationale for Evaluation and Treatment Rehabilitation   Subjective Assessment - 09/22/21 0936     Subjective Pt reports being dizzy yesterday and in discussion with his MD to modify his meds    Patient is accompained by: Family member -Wife   Pertinent History Patient is a 76 year old male with referral for Physical Therapy with diagnosis of Parkinsons disease and history of falling. He reports he diagnosed last year with Parkinsons and has improved his tremors with use of medication but reports worsening shuffling and difficulty with mobility. Patient has past medical history signifiant for Parkinsons; Arthritis, bladder cancer, Trigemic Neuralgia. He lives with wife in 1 level home and current using cane with reported recent fall.    Limitations Walking;Lifting;Standing;House hold activities    How long can you sit comfortably? No limits    How long can you stand comfortably? 15 min- limited due to back pain and foot numbess    How long can you walk  comfortably? about 80 feet    Patient Stated Goals Improve my balance, be able to get up off the floor so I can maybe work in my garage again.    Currently in Pain? No/denies               PRECAUTIONS: Falls   PAIN:  No pain reported. R calf pain earlier. No back pain today.    TODAY'S TREATMENT: 10/29/21  Therex:  NUSTEP- Progressive  L0- 1 min L1- 1 min L2- 2 min L4- 2 min Seat at level 8 and BUE support at 8 for total of 6 min- VC to keep SPM > 50    Stand to near sit without UE support - hold 5 sec (2 in from seat) x 10 reps  Seated trunk flex/ext holding onto 2 kg ball (with overhead reaching) x 15 reps  Seated trunk rotation holding onto PVC pipe - leaning to left and then to right x 12 reps each direction  Seated hip march with 4lb AW alt LE x 20 reps  Seated LAQ: 20 reps with 4lb AW  Forward step up with BUE support 2x10/LE. UE support and CGA.  Seated hamstring at edge of seat x 30 sec x 3 each LE  Standing hip flex- hold 30 sec x 2 using back foot on top of stool   Neuromuscular re-ed  Forward/backward/side stepping (4 square) with 4 lb AW   Education provided throughout session via VC/TC and demonstration to facilitate movement at target joints and correct muscle activation for all testing and exercises performed.     PATIENT EDUCATION: Education details: form/technique with exercise Person educated: Patient Education method: Explanation, Demonstration, Tactile cues, and Verbal cues Education comprehension: verbalized understanding, returned demonstration, verbal cues required, tactile cues required, and needs further education   HOME EXERCISE PROGRAM: No updates today   PT Short Term Goals -       PT SHORT TERM GOAL #1   Title Pt will be independent with INITIAL  HEP in order to improve strength and balance in order to decrease fall risk and improve function at home and work.    Baseline 04/07/2021= No formal HEP in place. 05/11/2021=  Patient reports doing pretty good- compliant with walking program and his stretching/strengthening.    Time 6    Period Weeks    Status Achieved    Target Date 05/19/21      PT SHORT TERM GOAL #2   Title Pt will decrease 5TSTS  by at least 3 seconds in order to demonstrate clinically significant improvement in LE strength.    Baseline 04/07/2021= 25 sec; 05/11/2021= 18.65 sec    Time 6    Period Weeks    Status Achieved    Target Date 06/30/21              PT Long Term Goals -       PT LONG TERM GOAL #1   Title Pt will be independent with FINAL HEP in order to improve strength and balance in order to decrease fall risk and improve function at home and work.    Baseline 04/07/2021= No formal HEP in place. 07/01/2021=Patient reports performing walking and some standing LE strengthening exercises as instructed and states no questions at this time.  09/02/21: Pt reports he feels indep with exercises but is not performing HEP as much as he should   Time 12    Period Weeks    Status Partially Met    Target Date 09/23/21      PT LONG TERM GOAL #2   Title Pt will improve FOTO to target score of 45% to display perceived improvements in ability to complete ADL's.    Baseline 2/7/22023= 41%; FOTO=53%    Time 12    Period Weeks    Status Achieved    Target Date 06/30/21      PT LONG TERM GOAL #3   Title Pt will decrease 5TSTS by at least 5 seconds in order to demonstrate clinically significant improvement in LE strength.    Baseline 04/07/2021= 25 sec; 05/11/2021= 18.65 sec without UE support; 4/10= 13.76 sec without UE support    Time 12    Period Weeks    Status Achieved    Target Date 06/30/21      PT LONG TERM GOAL #4   Title Pt will decrease TUG to below 17 seconds/decrease in order to demonstrate decreased fall risk.    Baseline 04/07/2021= 21 sec with SPC; 05/11/2021= 18.85 sec with use of cane; 07/21/2021=18.88 sec  avg with use of cane; 7/5: 22.85 sec with QC; 09/29/2021= 21.45 sec using  upright walker   Time 12    Period Weeks    Status Partially Met    Target Date 12/15/2021     PT LONG TERM GOAL #5   Title Pt will increase 10MWT by at least 0.15 m/s in order to demonstrate clinically significant improvement in community ambulation.    Baseline 04/07/2021= 0.56 m/s using SPC; 05/11/2021= 0.54 m/s using SPC; 07/01/2021= 0.6 m/s/ 7/5: 0.48 m/s without AD but with CGA; 09/29/2021= 0.64 m/s   Time 12    Period Weeks    Status On-going    Target Date 12/15/2021     PT LONG TERM GOAL #6   Title Pt will increase 6MWT by at least 73m(1697f in order to demonstrate clinically significant improvement in cardiopulmonary endurance and community ambulation    Baseline 06/08/2021= 600 ft. in 5 min- Stopped due to fatigue using 4WW; 07/01/2021= 665 in 5 min 45 sec using 4WW; 7/5: 367 ft with QC; 09/29/2021= 740 feet using upright 4WW   Time 4    Period Weeks    Status On-going    Target Date 12/15/2021             Plan - 08/20/21 0939     Clinical Impression Statement Continuing PT POC with focus on Increasing LE Strength and balance including improved step length. Decreased overall shuffling  after step training-forwards, backwards, and laterally. Patient presents with ongoing tightness -reviewed hamstring and added hip flexor stretch to assist with LE flexibility.  Pt continues to progress overall with resistance during session. Pt will continue to benefit from skilled Physical Therapy to address remaining goals and deficits and improve his mobility while decreasing his risk of falling and improving his quality of life.    Personal Factors and Comorbidities Comorbidity 3+    Comorbidities arthritis, bladder cancer, Trigeminal Neuralgia    Examination-Activity Limitations Caring for Others;Carry;Continence;Lift;Squat;Stairs;Stand    Examination-Participation Restrictions Community Activity;Yard Work    [employer] Evolving/Moderate complexity    Rehab Potential  Good    PT Frequency 2x / week    PT Duration 12 weeks    PT Treatment/Interventions ADLs/Self Care Home Management;Cryotherapy;Canalith Repostioning;Electrical Stimulation;Moist Heat;DME Instruction;Gait training;Stair training;Functional mobility training;Therapeutic activities;Therapeutic exercise;Balance training;Neuromuscular re-education;Patient/family education;Manual techniques;Passive range of motion;Dry needling;Vestibular    PT Next Visit Plan LE strength, improve step length with ambulation, muscle tissue lengthening    PT Home Exercise Plan Access Code: [place], PT Physical Therapist- [facility]  10/29/21, 11:16 AM

## 2021-10-30 ENCOUNTER — Ambulatory Visit: Payer: Medicare HMO

## 2021-11-05 ENCOUNTER — Ambulatory Visit: Payer: Medicare HMO | Attending: Neurology

## 2021-11-05 DIAGNOSIS — G8929 Other chronic pain: Secondary | ICD-10-CM | POA: Insufficient documentation

## 2021-11-05 DIAGNOSIS — M545 Low back pain, unspecified: Secondary | ICD-10-CM | POA: Insufficient documentation

## 2021-11-05 DIAGNOSIS — R262 Difficulty in walking, not elsewhere classified: Secondary | ICD-10-CM | POA: Insufficient documentation

## 2021-11-05 DIAGNOSIS — R269 Unspecified abnormalities of gait and mobility: Secondary | ICD-10-CM | POA: Diagnosis present

## 2021-11-05 DIAGNOSIS — R2689 Other abnormalities of gait and mobility: Secondary | ICD-10-CM | POA: Insufficient documentation

## 2021-11-05 DIAGNOSIS — M6281 Muscle weakness (generalized): Secondary | ICD-10-CM | POA: Diagnosis present

## 2021-11-05 DIAGNOSIS — R2681 Unsteadiness on feet: Secondary | ICD-10-CM | POA: Insufficient documentation

## 2021-11-05 DIAGNOSIS — R278 Other lack of coordination: Secondary | ICD-10-CM | POA: Insufficient documentation

## 2021-11-05 NOTE — Therapy (Signed)
OUTPATIENT PHYSICAL THERAPY TREATMENT NOTE        Patient Name: [name] MRN: [id number] DOB:29-Jan-1946, 76 y.o., male Today's Date: 11/06/2021  PCP: [name]. [name], [place] PROVIDER: Dr. Gurney Maxin   PT End of Session - 11/05/21 0934     Visit Number 20    Number of Visits 46    Date for PT Re-Evaluation 12/15/21    Authorization Type [employer]    Authorization Time Period 07/01/21-09/20/21    Progress Note Due on Visit 30    PT Start Time 0935    PT Stop Time 1014    PT Time Calculation (min) 39 min    Equipment Utilized During Treatment Gait belt    Activity Tolerance Patient tolerated treatment well;No increased pain    Behavior During Therapy [facility] for tasks assessed/performed                   Past Medical History:  Diagnosis Date   Anxiety    Arteriosclerosis of coronary artery 11/16/2011   Overview:  Stent 10/2011 stent rca 2015 with collaterals to lad which is chronically occluded    Benign enlargement of prostate    Benign essential HTN 06/11/2014   Benign prostatic hypertrophy without urinary obstruction 07/31/2014   Bilateral cataracts 05/16/2013   Overview:  Dr. [facility]     Bone spur of foot    Left   BP (high blood pressure) 11/09/2012   Cancer ([place])    skin (forehead) and bladder   Carotid artery narrowing 02/08/2014   Depression    Detrusor hypertrophy    Diabetes ([place])    Diabetes mellitus, type 2 ([place]) 12/12/2012   Diverticulosis    Dyspnea    Esophageal reflux    Esophageal reflux    Fothergill's neuralgia 08/07/2012   Overview:  [facility]    Gastritis    GERD (gastroesophageal reflux disease)    Headache    cluster headaches   Healed myocardial infarct 11/09/2012   Hearing loss in left ear    Heart disease    Hematuria    Hemorrhoids    History of hiatal hernia 12/14/2017   small    Hypercholesteremia    Lesion of bladder    Myocardial infarct (HCC)    Presence of stent in coronary artery  11/09/2012   Rectal bleeding    Trigeminal neuralgia    Trigeminal neuralgia    Valvular heart disease    Vitamin D deficiency    Past Surgical History:  Procedure Laterality Date   APPENDECTOMY     BOTOX INJECTION N/A 12/21/2017   Procedure: Bladder BOTOX INJECTION;  Surgeon: [name], MD;  Location: [facility] ORS;  Service: Urology;  Laterality: N/A;   BOTOX INJECTION N/A 09/11/2018   Procedure: Bladder BOTOX INJECTION;  Surgeon: [name], MD;  Location: [facility] ORS;  Service: Urology;  Laterality: N/A;   CARDIAC CATHETERIZATION     CARDIAC CATHETERIZATION N/A 01/22/2015   Procedure: Left Heart Cath;  Surgeon: Corey Skains, MD;  Location: [facility];  Service: Cardiovascular;  Laterality: N/A;   CARDIAC CATHETERIZATION N/A 01/22/2015   Procedure: Coronary Stent Intervention;  Surgeon: Isaias Cowman, MD;  Location: [facility];  Service: Cardiovascular;  Laterality: N/A;   CATARACT EXTRACTION, BILATERAL     COLONOSCOPY WITH PROPOFOL N/A 12/08/2015   Procedure: COLONOSCOPY WITH PROPOFOL;  Surgeon: Lollie Sails, MD;  Location: [facility] ENDOSCOPY;  Service: Endoscopy;  Laterality: N/A;  COLONOSCOPY WITH PROPOFOL N/A 12/09/2015   Procedure: COLONOSCOPY WITH PROPOFOL;  Surgeon: Lollie Sails, MD;  Location: [facility] ENDOSCOPY;  Service: Endoscopy;  Laterality: N/A;   CORONARY ANGIOPLASTY     5 stents   CORONARY STENT PLACEMENT  2015   x5   CYSTOSCOPY N/A 09/11/2018   Procedure: CYSTOSCOPY;  Surgeon: [name], MD;  Location: [facility] ORS;  Service: Urology;  Laterality: N/A;   CYSTOSCOPY WITH BIOPSY N/A 12/21/2017   Procedure: CYSTOSCOPY WITH BIOPSY;  Surgeon: [name], MD;  Location: [facility] ORS;  Service: Urology;  Laterality: N/A;   ESOPHAGOGASTRODUODENOSCOPY (EGD) WITH PROPOFOL N/A 12/08/2015   Procedure: ESOPHAGOGASTRODUODENOSCOPY (EGD) WITH PROPOFOL;  Surgeon: Lollie Sails, MD;  Location[facility] ENDOSCOPY;  Service: Endoscopy;  Laterality:  N/A;   ESOPHAGOGASTRODUODENOSCOPY (EGD) WITH PROPOFOL N/A 12/13/2017   Procedure: ESOPHAGOGASTRODUODENOSCOPY (EGD) WITH PROPOFOL;  Surgeon: Lollie Sails, MD;  Location: [facility] ENDOSCOPY;  Service: Endoscopy;  Laterality: N/A;   EYE SURGERY     HERNIA REPAIR     kidney tumor remove     TRANSURETHRAL RESECTION OF BLADDER TUMOR WITH GYRUS (TURBT-GYRUS)  85/0277   UMBILICAL HERNIA REPAIR     urethral meatotomy     Patient Active Problem List   Diagnosis Date Noted   Class 2 obesity due to excess calories with body mass index (BMI) of 36.0 to 36.9 in adult 04/11/2020   Swelling of limb 03/28/2020   Lymphedema 03/28/2020   Trigeminal neuralgia 12/25/2019   Lower limb ulcer, calf, left, limited to breakdown of skin ([place]) 12/25/2019   OSA (obstructive sleep apnea) 41/28/7867   Acute systolic CHF (congestive heart failure) ([place]) 12/12/2018   Bruising 07/10/2018   Diet-controlled type 2 diabetes mellitus ([place]) 03/24/2018   Microalbuminuria 03/24/2018   Dizziness 11/17/2017   Simple chronic bronchitis ([place]) 09/22/2017   SOBOE (shortness of breath on exertion) 02/18/2017   Chronic midline low back pain without sciatica 12/29/2016   Periodic limb movement disorder 12/29/2016   Benign essential tremor 06/22/2016   Pure hypercholesterolemia 06/20/2015   Erectile dysfunction due to arterial insufficiency 06/16/2015   Unstable angina ([place]) 01/22/2015   Abdominal aortic 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 rupture ([place]) 12/31/2014   Benign prostatic hypertrophy without urinary obstruction 07/31/2014   Enlarged prostate 07/31/2014   Benign essential HTN 06/11/2014   Carotid artery narrowing 02/08/2014   Carotid artery obstruction 02/08/2014   Bilateral carotid artery stenosis 02/08/2014   Chest pain 08/20/2013   Bilateral cataracts 05/16/2013   Cataract 05/16/2013   Clinical depression 12/12/2012   Diabetes mellitus, type 2 ([place]) 12/12/2012   Combined fat and carbohydrate induced hyperlipemia  12/12/2012   Major depressive disorder, single episode, unspecified 12/12/2012   Acid reflux 11/09/2012   Presence of stent in coronary artery 11/09/2012   BP (high blood pressure) 11/09/2012   Healed myocardial infarct 11/09/2012   Gastro-esophageal reflux disease without esophagitis 11/09/2012   History of cardiovascular surgery 11/09/2012   Fothergill's neuralgia 08/07/2012   Swelling of testicle 04/06/2012   Disorder of male genital organ 04/06/2012   Swelling of the testicles 04/06/2012   Arteriosclerosis of coronary artery 11/16/2011   CAD in native artery 11/16/2011   Fatigue 06/04/2011   Avitaminosis D 11/30/2010    REFERRING DIAG: Repeated falls   THERAPY DIAG:   Difficulty in walking, not elsewhere classified  Muscle weakness (generalized)  Abnormality of gait and mobility  Other lack of coordination  Other abnormalities of gait and mobility  Unsteadiness on feet  Chronic bilateral low back  pain without sciatica  Rationale for Evaluation and Treatment Rehabilitation   Subjective Assessment - 09/22/21 0936     Subjective Pt reports having a good week- States not as much trouble walking this week.   Patient is accompained by: Family member -Wife   Pertinent History Patient is a 76 year old male with referral for Physical Therapy with diagnosis of Parkinsons disease and history of falling. He reports he diagnosed last year with Parkinsons and has improved his tremors with use of medication but reports worsening shuffling and difficulty with mobility. Patient has past medical history signifiant for Parkinsons; Arthritis, bladder cancer, Trigemic Neuralgia. He lives with wife in 1 level home and current using cane with reported recent fall.    Limitations Walking;Lifting;Standing;House hold activities    How long can you sit comfortably? No limits    How long can you stand comfortably? 15 min- limited due to back pain and foot numbess    How long can you walk  comfortably? about 80 feet    Patient Stated Goals Improve my balance, be able to get up off the floor so I can maybe work in my garage again.    Currently in Pain? No/denies               PRECAUTIONS: Falls   PAIN:  No pain reported. R calf pain earlier. No back pain today.    TODAY'S TREATMENT: 10/29/21  Therex:  NUSTEP- Progressive  L0- 1 min L1- 1 min L2- 2 min L4- 2 min Seat at level 8 and BUE support at 8 for total of 6 min- VC to keep SPM > 50    Sit to stand x 15 reps without UE support  Ladder drills - with 3lb AW - forward/retro steps (step over step forward yet increased difficulty with retro steps- mostly step to gait.)  Seated trunk flex/ext holding onto 2 kg ball (with overhead reaching) x 15 reps- VC to raise arms and extend trunk   Seated trunk rotation holding onto PVC pipe - rotating to left and then to right x 12 reps each direction  Step up onto 6" step with contralateral hip march with 3lb AW alt LE x 12 reps  Seated LAQ: 20 reps with 4lb AW -patient reported as "tiring"  Forward step up onto 6" block with BUE support 2x10/LE. UE support and CGA.      Education provided throughout session via VC/TC and demonstration to facilitate movement at target joints and correct muscle activation for all testing and exercises performed.     PATIENT EDUCATION: Education details: form/technique with exercise Person educated: Patient Education method: Explanation, Demonstration, Tactile cues, and Verbal cues Education comprehension: verbalized understanding, returned demonstration, verbal cues required, tactile cues required, and needs further education   HOME EXERCISE PROGRAM: No updates today   PT Short Term Goals -       PT SHORT TERM GOAL #1   Title Pt will be independent with INITIAL  HEP in order to improve strength and balance in order to decrease fall risk and improve function at home and work.    Baseline 04/07/2021= No formal HEP in  place. 05/11/2021= Patient reports doing pretty good- compliant with walking program and his stretching/strengthening.    Time 6    Period Weeks    Status Achieved    Target Date 05/19/21      PT SHORT TERM GOAL #2   Title Pt will decrease 5TSTS by at least 3 seconds in  order to demonstrate clinically significant improvement in LE strength.    Baseline 04/07/2021= 25 sec; 05/11/2021= 18.65 sec    Time 6    Period Weeks    Status Achieved    Target Date 06/30/21              PT Long Term Goals -       PT LONG TERM GOAL #1   Title Pt will be independent with FINAL HEP in order to improve strength and balance in order to decrease fall risk and improve function at home and work.    Baseline 04/07/2021= No formal HEP in place. 07/01/2021=Patient reports performing walking and some standing LE strengthening exercises as instructed and states no questions at this time.  09/02/21: Pt reports he feels indep with exercises but is not performing HEP as much as he should   Time 12    Period Weeks    Status Partially Met    Target Date 09/23/21      PT LONG TERM GOAL #2   Title Pt will improve FOTO to target score of 45% to display perceived improvements in ability to complete ADL's.    Baseline 2/7/22023= 41%; FOTO=53%    Time 12    Period Weeks    Status Achieved    Target Date 06/30/21      PT LONG TERM GOAL #3   Title Pt will decrease 5TSTS by at least 5 seconds in order to demonstrate clinically significant improvement in LE strength.    Baseline 04/07/2021= 25 sec; 05/11/2021= 18.65 sec without UE support; 4/10= 13.76 sec without UE support    Time 12    Period Weeks    Status Achieved    Target Date 06/30/21      PT LONG TERM GOAL #4   Title Pt will decrease TUG to below 17 seconds/decrease in order to demonstrate decreased fall risk.    Baseline 04/07/2021= 21 sec with SPC; 05/11/2021= 18.85 sec with use of cane; 07/21/2021=18.88 sec  avg with use of cane; 7/5: 22.85 sec with QC; 09/29/2021=  21.45 sec using upright walker   Time 12    Period Weeks    Status Partially Met    Target Date 12/15/2021     PT LONG TERM GOAL #5   Title Pt will increase 10MWT by at least 0.15 m/s in order to demonstrate clinically significant improvement in community ambulation.    Baseline 04/07/2021= 0.56 m/s using SPC; 05/11/2021= 0.54 m/s using SPC; 07/01/2021= 0.6 m/s/ 7/5: 0.48 m/s without AD but with CGA; 09/29/2021= 0.64 m/s   Time 12    Period Weeks    Status On-going    Target Date 12/15/2021     PT LONG TERM GOAL #6   Title Pt will increase 6MWT by at least 20m(1656f in order to demonstrate clinically significant improvement in cardiopulmonary endurance and community ambulation    Baseline 06/08/2021= 600 ft. in 5 min- Stopped due to fatigue using 4WW; 07/01/2021= 665 in 5 min 45 sec using 4WW; 7/5: 367 ft with QC; 09/29/2021= 740 feet using upright 4WW   Time 4    Period Weeks    Status On-going    Target Date 12/15/2021             Plan - 08/20/21 0939     Clinical Impression Statement Patient responded well to treatment with less overall fatigue and improved standing with only unsteadiness with ladder drill- retro stepping. He continues to require  some VC for erect posture as well.  Pt will continue to benefit from skilled Physical Therapy to address remaining goals and deficits and improve his mobility while decreasing his risk of falling and improving his quality of life.    Personal Factors and Comorbidities Comorbidity 3+    Comorbidities arthritis, bladder cancer, Trigeminal Neuralgia    Examination-Activity Limitations Caring for Others;Carry;Continence;Lift;Squat;Stairs;Stand    Examination-Participation Restrictions Community Activity;Yard Work    [employer] Evolving/Moderate complexity    Rehab Potential Good    PT Frequency 2x / week    PT Duration 12 weeks    PT Treatment/Interventions ADLs/Self Care Home Management;Cryotherapy;Canalith  Repostioning;Electrical Stimulation;Moist Heat;DME Instruction;Gait training;Stair training;Functional mobility training;Therapeutic activities;Therapeutic exercise;Balance training;Neuromuscular re-education;Patient/family education;Manual techniques;Passive range of motion;Dry needling;Vestibular    PT Next Visit Plan LE strength, improve step length with ambulation, muscle tissue lengthening    PT Home Exercise Plan Access Code: [place], PT Physical Therapist- [facility]  11/06/21, 1:02 PM

## 2021-11-10 ENCOUNTER — Ambulatory Visit: Payer: Medicare HMO | Attending: Neurology

## 2021-11-10 DIAGNOSIS — R262 Difficulty in walking, not elsewhere classified: Secondary | ICD-10-CM | POA: Diagnosis present

## 2021-11-10 DIAGNOSIS — R2689 Other abnormalities of gait and mobility: Secondary | ICD-10-CM | POA: Insufficient documentation

## 2021-11-10 DIAGNOSIS — R2681 Unsteadiness on feet: Secondary | ICD-10-CM | POA: Insufficient documentation

## 2021-11-10 DIAGNOSIS — R278 Other lack of coordination: Secondary | ICD-10-CM | POA: Diagnosis present

## 2021-11-10 DIAGNOSIS — M545 Low back pain, unspecified: Secondary | ICD-10-CM | POA: Insufficient documentation

## 2021-11-10 DIAGNOSIS — M6281 Muscle weakness (generalized): Secondary | ICD-10-CM | POA: Insufficient documentation

## 2021-11-10 DIAGNOSIS — R269 Unspecified abnormalities of gait and mobility: Secondary | ICD-10-CM | POA: Diagnosis present

## 2021-11-10 DIAGNOSIS — G8929 Other chronic pain: Secondary | ICD-10-CM | POA: Insufficient documentation

## 2021-11-10 NOTE — Therapy (Signed)
OUTPATIENT PHYSICAL THERAPY TREATMENT NOTE        Patient Name: [name] MRN: [id number] DOB:27-Jul-1945, 76 y.o., male Today's Date: 11/11/2021  PCP: [name]. [name], FNP REFERRING PROVIDER: Dr. Gurney Maxin   PT End of Session - 11/10/21 0933     Visit Number 3    Number of Visits 7    Date for PT Re-Evaluation 12/15/21    Authorization Type [employer]    Authorization Time Period 07/01/21-09/20/21    Progress Note Due on Visit 60    PT Start Time 0930    PT Stop Time 1014    PT Time Calculation (min) 44 min    Equipment Utilized During Treatment Gait belt    Activity Tolerance Patient tolerated treatment well;No increased pain    Behavior During Therapy [facility] for tasks assessed/performed                   Past Medical History:  Diagnosis Date   Anxiety    Arteriosclerosis of coronary artery 11/16/2011   Overview:  Stent 10/2011 stent rca 2015 with collaterals to lad which is chronically occluded    Benign enlargement of prostate    Benign essential HTN 06/11/2014   Benign prostatic hypertrophy without urinary obstruction 07/31/2014   Bilateral cataracts 05/16/2013   Overview:  Dr. [facility]     Bone spur of foot    Left   BP (high blood pressure) 11/09/2012   Cancer ([place])    skin (forehead) and bladder   Carotid artery narrowing 02/08/2014   Depression    Detrusor hypertrophy    Diabetes ([place])    Diabetes mellitus, type 2 ([place]) 12/12/2012   Diverticulosis    Dyspnea    Esophageal reflux    Esophageal reflux    Fothergill's neuralgia 08/07/2012   Overview:  [facility]    Gastritis    GERD (gastroesophageal reflux disease)    Headache    cluster headaches   Healed myocardial infarct 11/09/2012   Hearing loss in left ear    Heart disease    Hematuria    Hemorrhoids    History of hiatal hernia 12/14/2017   small    Hypercholesteremia    Lesion of bladder    Myocardial infarct (HCC)    Presence of stent in coronary artery  11/09/2012   Rectal bleeding    Trigeminal neuralgia    Trigeminal neuralgia    Valvular heart disease    Vitamin D deficiency    Past Surgical History:  Procedure Laterality Date   APPENDECTOMY     BOTOX INJECTION N/A 12/21/2017   Procedure: Bladder BOTOX INJECTION;  Surgeon: [name], MD;  Location: [facility] ORS;  Service: Urology;  Laterality: N/A;   BOTOX INJECTION N/A 09/11/2018   Procedure: Bladder BOTOX INJECTION;  Surgeon: [name], MD;  Location: [facility] ORS;  Service: Urology;  Laterality: N/A;   CARDIAC CATHETERIZATION     CARDIAC CATHETERIZATION N/A 01/22/2015   Procedure: Left Heart Cath;  Surgeon: Corey Skains, MD;  Location: [place] CV [facility];  Service: Cardiovascular;  Laterality: N/A;   CARDIAC CATHETERIZATION N/A 01/22/2015   Procedure: Coronary Stent Intervention;  Surgeon: Isaias Cowman, MD;  Location: [place] [facility];  Service: Cardiovascular;  Laterality: N/A;   CATARACT EXTRACTION, BILATERAL     COLONOSCOPY WITH PROPOFOL N/A 12/08/2015   Procedure: COLONOSCOPY WITH PROPOFOL;  Surgeon: Lollie Sails, MD;  Location: [facility] ENDOSCOPY;  Service: Endoscopy;  Laterality: N/A;  COLONOSCOPY WITH PROPOFOL N/A 12/09/2015   Procedure: COLONOSCOPY WITH PROPOFOL;  Surgeon: Lollie Sails, MD;  Location: [facility] ENDOSCOPY;  Service: Endoscopy;  Laterality: N/A;   CORONARY ANGIOPLASTY     5 stents   CORONARY STENT PLACEMENT  2015   x5   CYSTOSCOPY N/A 09/11/2018   Procedure: CYSTOSCOPY;  Surgeon: [name], MD;  Location: [facility] ORS;  Service: Urology;  Laterality: N/A;   CYSTOSCOPY WITH BIOPSY N/A 12/21/2017   Procedure: CYSTOSCOPY WITH BIOPSY;  Surgeon: [name], MD;  Location: [facility] ORS;  Service: Urology;  Laterality: N/A;   ESOPHAGOGASTRODUODENOSCOPY (EGD) WITH PROPOFOL N/A 12/08/2015   Procedure: ESOPHAGOGASTRODUODENOSCOPY (EGD) WITH PROPOFOL;  Surgeon: Lollie Sails, MD;  Location: [facility] ENDOSCOPY;  Service: Endoscopy;  Laterality:  N/A;   ESOPHAGOGASTRODUODENOSCOPY (EGD) WITH PROPOFOL N/A 12/13/2017   Procedure: ESOPHAGOGASTRODUODENOSCOPY (EGD) WITH PROPOFOL;  Surgeon: Lollie Sails, MD;  Location: [facility] ENDOSCOPY;  Service: Endoscopy;  Laterality: N/A;   EYE SURGERY     HERNIA REPAIR     kidney tumor remove     TRANSURETHRAL RESECTION OF BLADDER TUMOR WITH GYRUS (TURBT-GYRUS)  85/0277   UMBILICAL HERNIA REPAIR     urethral meatotomy     Patient Active Problem List   Diagnosis Date Noted   Class 2 obesity due to excess calories with body mass index (BMI) of 36.0 to 36.9 in adult 04/11/2020   Swelling of limb 03/28/2020   Lymphedema 03/28/2020   Trigeminal neuralgia 12/25/2019   Lower limb ulcer, calf, left, limited to breakdown of skin ([place]) 12/25/2019   OSA (obstructive sleep apnea) 41/28/7867   Acute systolic CHF (congestive heart failure) ([place]) 12/12/2018   Bruising 07/10/2018   Diet-controlled type 2 diabetes mellitus ([place]) 03/24/2018   Microalbuminuria 03/24/2018   Dizziness 11/17/2017   Simple chronic bronchitis ([place]) 09/22/2017   SOBOE (shortness of breath on exertion) 02/18/2017   Chronic midline low back pain without sciatica 12/29/2016   Periodic limb movement disorder 12/29/2016   Benign essential tremor 06/22/2016   Pure hypercholesterolemia 06/20/2015   Erectile dysfunction due to arterial insufficiency 06/16/2015   Unstable angina ([place]) 01/22/2015   Abdominal aortic 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 rupture ([place]) 12/31/2014   Benign prostatic hypertrophy without urinary obstruction 07/31/2014   Enlarged prostate 07/31/2014   Benign essential HTN 06/11/2014   Carotid artery narrowing 02/08/2014   Carotid artery obstruction 02/08/2014   Bilateral carotid artery stenosis 02/08/2014   Chest pain 08/20/2013   Bilateral cataracts 05/16/2013   Cataract 05/16/2013   Clinical depression 12/12/2012   Diabetes mellitus, type 2 ([place]) 12/12/2012   Combined fat and carbohydrate induced hyperlipemia  12/12/2012   Major depressive disorder, single episode, unspecified 12/12/2012   Acid reflux 11/09/2012   Presence of stent in coronary artery 11/09/2012   BP (high blood pressure) 11/09/2012   Healed myocardial infarct 11/09/2012   Gastro-esophageal reflux disease without esophagitis 11/09/2012   History of cardiovascular surgery 11/09/2012   Fothergill's neuralgia 08/07/2012   Swelling of testicle 04/06/2012   Disorder of male genital organ 04/06/2012   Swelling of the testicles 04/06/2012   Arteriosclerosis of coronary artery 11/16/2011   CAD in native artery 11/16/2011   Fatigue 06/04/2011   Avitaminosis D 11/30/2010    REFERRING DIAG: Repeated falls   THERAPY DIAG:   Difficulty in walking, not elsewhere classified  Muscle weakness (generalized)  Abnormality of gait and mobility  Other lack of coordination  Other abnormalities of gait and mobility  Unsteadiness on feet  Chronic bilateral low back  pain without sciatica  Rationale for Evaluation and Treatment Rehabilitation   Subjective Assessment - 09/22/21 0936     Subjective Pt reports having some trouble with walking over the weekend. Reports moving slow this weekend.   Patient is accompained by: Family member -Wife   Pertinent History Patient is a 76 year old male with referral for Physical Therapy with diagnosis of Parkinsons disease and history of falling. He reports he diagnosed last year with Parkinsons and has improved his tremors with use of medication but reports worsening shuffling and difficulty with mobility. Patient has past medical history signifiant for Parkinsons; Arthritis, bladder cancer, Trigemic Neuralgia. He lives with wife in 1 level home and current using cane with reported recent fall.    Limitations Walking;Lifting;Standing;House hold activities    How long can you sit comfortably? No limits    How long can you stand comfortably? 15 min- limited due to back pain and foot numbess    How long  can you walk comfortably? about 80 feet    Patient Stated Goals Improve my balance, be able to get up off the floor so I can maybe work in my garage again.    Currently in Pain? No/denies               PRECAUTIONS: Falls   PAIN:  No pain reported. R calf pain earlier. No back pain today.    TODAY'S TREATMENT: 10/29/21  Therex:   Seated hip march with 3lb AW  2 sets of 12 reps alt LE- VC for height   Sit to stand x 15 reps with arms outstretched  holding onto rainbow ball    Seated trunk flex/ext holding onto rainbow colored ball (with overhead reaching) x 15 reps- VC to raise arms and extend trunk   Seated trunk rotation holding onto PVC pipe - rotating to left and then to right x 12 reps each direction  Step up onto 6" step with trunk twist (to left and right with rainbow colored ball)   Seated LAQ: 2 sets of 12 reps with 3lb AW -patient reported as "tiring"  Forward step up onto 12" block with BUE support x 15/LE. UE support and CGA.      Education provided throughout session via VC/TC and demonstration to facilitate movement at target joints and correct muscle activation for all testing and exercises performed.     PATIENT EDUCATION: Education details: form/technique with exercise Person educated: Patient Education method: Explanation, Demonstration, Tactile cues, and Verbal cues Education comprehension: verbalized understanding, returned demonstration, verbal cues required, tactile cues required, and needs further education   HOME EXERCISE PROGRAM: No updates today   PT Short Term Goals -       PT SHORT TERM GOAL #1   Title Pt will be independent with INITIAL  HEP in order to improve strength and balance in order to decrease fall risk and improve function at home and work.    Baseline 04/07/2021= No formal HEP in place. 05/11/2021= Patient reports doing pretty good- compliant with walking program and his stretching/strengthening.    Time 6    Period  Weeks    Status Achieved    Target Date 05/19/21      PT SHORT TERM GOAL #2   Title Pt will decrease 5TSTS by at least 3 seconds in order to demonstrate clinically significant improvement in LE strength.    Baseline 04/07/2021= 25 sec; 05/11/2021= 18.65 sec    Time 6    Period Weeks  Status Achieved    Target Date 06/30/21              PT Long Term Goals -       PT LONG TERM GOAL #1   Title Pt will be independent with FINAL HEP in order to improve strength and balance in order to decrease fall risk and improve function at home and work.    Baseline 04/07/2021= No formal HEP in place. 07/01/2021=Patient reports performing walking and some standing LE strengthening exercises as instructed and states no questions at this time.  09/02/21: Pt reports he feels indep with exercises but is not performing HEP as much as he should   Time 12    Period Weeks    Status Partially Met    Target Date 09/23/21      PT LONG TERM GOAL #2   Title Pt will improve FOTO to target score of 45% to display perceived improvements in ability to complete ADL's.    Baseline 2/7/22023= 41%; FOTO=53%    Time 12    Period Weeks    Status Achieved    Target Date 06/30/21      PT LONG TERM GOAL #3   Title Pt will decrease 5TSTS by at least 5 seconds in order to demonstrate clinically significant improvement in LE strength.    Baseline 04/07/2021= 25 sec; 05/11/2021= 18.65 sec without UE support; 4/10= 13.76 sec without UE support    Time 12    Period Weeks    Status Achieved    Target Date 06/30/21      PT LONG TERM GOAL #4   Title Pt will decrease TUG to below 17 seconds/decrease in order to demonstrate decreased fall risk.    Baseline 04/07/2021= 21 sec with SPC; 05/11/2021= 18.85 sec with use of cane; 07/21/2021=18.88 sec  avg with use of cane; 7/5: 22.85 sec with QC; 09/29/2021= 21.45 sec using upright walker   Time 12    Period Weeks    Status Partially Met    Target Date 12/15/2021     PT LONG TERM GOAL #5    Title Pt will increase 10MWT by at least 0.15 m/s in order to demonstrate clinically significant improvement in community ambulation.    Baseline 04/07/2021= 0.56 m/s using SPC; 05/11/2021= 0.54 m/s using SPC; 07/01/2021= 0.6 m/s/ 7/5: 0.48 m/s without AD but with CGA; 09/29/2021= 0.64 m/s   Time 12    Period Weeks    Status On-going    Target Date 12/15/2021     PT LONG TERM GOAL #6   Title Pt will increase 6MWT by at least 55m (131ft) in order to demonstrate clinically significant improvement in cardiopulmonary endurance and community ambulation    Baseline 06/08/2021= 600 ft. in 5 min- Stopped due to fatigue using 4WW; 07/01/2021= 665 in 5 min 45 sec using 4WW; 7/5: 367 ft with QC; 09/29/2021= 740 feet using upright 4WW   Time 4    Period Weeks    Status On-going    Target Date 12/15/2021             Plan - 08/20/21 0939     Clinical Impression Statement Patient presented with overall increased fatigue today but able to complete most activities with slight more rest break. Slight increase difficulty with trunk twisting and lifting LE vs previous visits. Pt will continue to benefit from skilled Physical Therapy to address remaining goals and deficits and improve his mobility while decreasing his risk of falling and  improving his quality of life.    Personal Factors and Comorbidities Comorbidity 3+    Comorbidities arthritis, bladder cancer, Trigeminal Neuralgia    Examination-Activity Limitations Caring for Others;Carry;Continence;Lift;Squat;Stairs;Stand    Examination-Participation Restrictions Community Activity;Yard Work    [employer] Evolving/Moderate complexity    Rehab Potential Good    PT Frequency 2x / week    PT Duration 12 weeks    PT Treatment/Interventions ADLs/Self Care Home Management;Cryotherapy;Canalith Repostioning;Electrical Stimulation;Moist Heat;DME Instruction;Gait training;Stair training;Functional mobility training;Therapeutic  activities;Therapeutic exercise;Balance training;Neuromuscular re-education;Patient/family education;Manual techniques;Passive range of motion;Dry needling;Vestibular    PT Next Visit Plan LE strength, improve step length with ambulation, muscle tissue lengthening    PT Home Exercise Plan Access Code: Silex Senan Urey, PT Physical Therapist- [facility]  11/11/21, 12:16 PM

## 2021-11-12 ENCOUNTER — Ambulatory Visit: Payer: Medicare HMO

## 2021-11-12 DIAGNOSIS — R2689 Other abnormalities of gait and mobility: Secondary | ICD-10-CM

## 2021-11-12 DIAGNOSIS — R262 Difficulty in walking, not elsewhere classified: Secondary | ICD-10-CM | POA: Diagnosis not present

## 2021-11-12 DIAGNOSIS — R278 Other lack of coordination: Secondary | ICD-10-CM

## 2021-11-12 DIAGNOSIS — G8929 Other chronic pain: Secondary | ICD-10-CM

## 2021-11-12 DIAGNOSIS — R2681 Unsteadiness on feet: Secondary | ICD-10-CM

## 2021-11-12 DIAGNOSIS — M6281 Muscle weakness (generalized): Secondary | ICD-10-CM

## 2021-11-12 DIAGNOSIS — R269 Unspecified abnormalities of gait and mobility: Secondary | ICD-10-CM

## 2021-11-12 NOTE — Therapy (Signed)
OUTPATIENT PHYSICAL THERAPY TREATMENT NOTE        Patient Name: [name] MRN: [id number] DOB:1945-10-11, 76 y.o., male Today's Date: 11/13/2021  PCP: [name]. [name], FNP REFERRING PROVIDER: Dr. Gurney Maxin   PT End of Session - 11/12/21 0935     Visit Number 78    Number of Visits 73    Date for PT Re-Evaluation 12/15/21    Authorization Type [employer]    Authorization Time Period 07/01/21-09/20/21    Progress Note Due on Visit 60    PT Start Time 0929    PT Stop Time 1013    PT Time Calculation (min) 44 min    Equipment Utilized During Treatment Gait belt    Activity Tolerance Patient tolerated treatment well;No increased pain    Behavior During Therapy [facility] for tasks assessed/performed                   Past Medical History:  Diagnosis Date   Anxiety    Arteriosclerosis of coronary artery 11/16/2011   Overview:  Stent 10/2011 stent rca 2015 with collaterals to lad which is chronically occluded    Benign enlargement of prostate    Benign essential HTN 06/11/2014   Benign prostatic hypertrophy without urinary obstruction 07/31/2014   Bilateral cataracts 05/16/2013   Overview:  Dr. [facility]     Bone spur of foot    Left   BP (high blood pressure) 11/09/2012   Cancer ([place])    skin (forehead) and bladder   Carotid artery narrowing 02/08/2014   Depression    Detrusor hypertrophy    Diabetes ([place])    Diabetes mellitus, type 2 ([place]) 12/12/2012   Diverticulosis    Dyspnea    Esophageal reflux    Esophageal reflux    Fothergill's neuralgia 08/07/2012   Overview:  [facility]    Gastritis    GERD (gastroesophageal reflux disease)    Headache    cluster headaches   Healed myocardial infarct 11/09/2012   Hearing loss in left ear    Heart disease    Hematuria    Hemorrhoids    History of hiatal hernia 12/14/2017   small    Hypercholesteremia    Lesion of bladder    Myocardial infarct (HCC)    Presence of stent in coronary artery  11/09/2012   Rectal bleeding    Trigeminal neuralgia    Trigeminal neuralgia    Valvular heart disease    Vitamin D deficiency    Past Surgical History:  Procedure Laterality Date   APPENDECTOMY     BOTOX INJECTION N/A 12/21/2017   Procedure: Bladder BOTOX INJECTION;  Surgeon: [name], MD;  Location: [facility] ORS;  Service: Urology;  Laterality: N/A;   BOTOX INJECTION N/A 09/11/2018   Procedure: Bladder BOTOX INJECTION;  Surgeon: [name], MD;  Location: [facility] ORS;  Service: Urology;  Laterality: N/A;   CARDIAC CATHETERIZATION     CARDIAC CATHETERIZATION N/A 01/22/2015   Procedure: Left Heart Cath;  Surgeon: Corey Skains, MD;  Location: [place] CV LAB;  Service: Cardiovascular;  Laterality: N/A;   CARDIAC CATHETERIZATION N/A 01/22/2015   Procedure: Coronary Stent Intervention;  Surgeon: Isaias Cowman, MD;  Location: [place] CV LAB;  Service: Cardiovascular;  Laterality: N/A;   CATARACT EXTRACTION, BILATERAL     COLONOSCOPY WITH PROPOFOL N/A 12/08/2015   Procedure: COLONOSCOPY WITH PROPOFOL;  Surgeon: Lollie Sails, MD;  Location: [facility] ENDOSCOPY;  Service: Endoscopy;  Laterality: N/A;  COLONOSCOPY WITH PROPOFOL N/A 12/09/2015   Procedure: COLONOSCOPY WITH PROPOFOL;  Surgeon: Lollie Sails, MD;  Location: [facility] ENDOSCOPY;  Service: Endoscopy;  Laterality: N/A;   CORONARY ANGIOPLASTY     5 stents   CORONARY STENT PLACEMENT  2015   x5   CYSTOSCOPY N/A 09/11/2018   Procedure: CYSTOSCOPY;  Surgeon: [name], MD;  Location: [facility] ORS;  Service: Urology;  Laterality: N/A;   CYSTOSCOPY WITH BIOPSY N/A 12/21/2017   Procedure: CYSTOSCOPY WITH BIOPSY;  Surgeon: [name], MD;  Location: [facility] ORS;  Service: Urology;  Laterality: N/A;   ESOPHAGOGASTRODUODENOSCOPY (EGD) WITH PROPOFOL N/A 12/08/2015   Procedure: ESOPHAGOGASTRODUODENOSCOPY (EGD) WITH PROPOFOL;  Surgeon: Lollie Sails, MD;  Location: [facility] ENDOSCOPY;  Service: Endoscopy;  Laterality:  N/A;   ESOPHAGOGASTRODUODENOSCOPY (EGD) WITH PROPOFOL N/A 12/13/2017   Procedure: ESOPHAGOGASTRODUODENOSCOPY (EGD) WITH PROPOFOL;  Surgeon: Lollie Sails, MD;  Location: [facility] ENDOSCOPY;  Service: Endoscopy;  Laterality: N/A;   EYE SURGERY     HERNIA REPAIR     kidney tumor remove     TRANSURETHRAL RESECTION OF BLADDER TUMOR WITH GYRUS (TURBT-GYRUS)  85/0277   UMBILICAL HERNIA REPAIR     urethral meatotomy     Patient Active Problem List   Diagnosis Date Noted   Class 2 obesity due to excess calories with body mass index (BMI) of 36.0 to 36.9 in adult 04/11/2020   Swelling of limb 03/28/2020   Lymphedema 03/28/2020   Trigeminal neuralgia 12/25/2019   Lower limb ulcer, calf, left, limited to breakdown of skin ([place]) 12/25/2019   OSA (obstructive sleep apnea) 41/28/7867   Acute systolic CHF (congestive heart failure) ([place]) 12/12/2018   Bruising 07/10/2018   Diet-controlled type 2 diabetes mellitus ([place]) 03/24/2018   Microalbuminuria 03/24/2018   Dizziness 11/17/2017   Simple chronic bronchitis ([place]) 09/22/2017   SOBOE (shortness of breath on exertion) 02/18/2017   Chronic midline low back pain without sciatica 12/29/2016   Periodic limb movement disorder 12/29/2016   Benign essential tremor 06/22/2016   Pure hypercholesterolemia 06/20/2015   Erectile dysfunction due to arterial insufficiency 06/16/2015   Unstable angina ([place]) 01/22/2015   Abdominal aortic 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 rupture ([place]) 12/31/2014   Benign prostatic hypertrophy without urinary obstruction 07/31/2014   Enlarged prostate 07/31/2014   Benign essential HTN 06/11/2014   Carotid artery narrowing 02/08/2014   Carotid artery obstruction 02/08/2014   Bilateral carotid artery stenosis 02/08/2014   Chest pain 08/20/2013   Bilateral cataracts 05/16/2013   Cataract 05/16/2013   Clinical depression 12/12/2012   Diabetes mellitus, type 2 ([place]) 12/12/2012   Combined fat and carbohydrate induced hyperlipemia  12/12/2012   Major depressive disorder, single episode, unspecified 12/12/2012   Acid reflux 11/09/2012   Presence of stent in coronary artery 11/09/2012   BP (high blood pressure) 11/09/2012   Healed myocardial infarct 11/09/2012   Gastro-esophageal reflux disease without esophagitis 11/09/2012   History of cardiovascular surgery 11/09/2012   Fothergill's neuralgia 08/07/2012   Swelling of testicle 04/06/2012   Disorder of male genital organ 04/06/2012   Swelling of the testicles 04/06/2012   Arteriosclerosis of coronary artery 11/16/2011   CAD in native artery 11/16/2011   Fatigue 06/04/2011   Avitaminosis D 11/30/2010    REFERRING DIAG: Repeated falls   THERAPY DIAG:   Difficulty in walking, not elsewhere classified  Muscle weakness (generalized)  Abnormality of gait and mobility  Other lack of coordination  Other abnormalities of gait and mobility  Unsteadiness on feet  Chronic bilateral low back  pain without sciatica  Rationale for Evaluation and Treatment Rehabilitation   Subjective Assessment - 09/22/21 0936     Subjective Pt reports doing okay- no falls - just moving slow and groin was little sore after last session.   Patient is accompained by: Family member -Wife   Pertinent History Patient is a 76 year old male with referral for Physical Therapy with diagnosis of Parkinsons disease and history of falling. He reports he diagnosed last year with Parkinsons and has improved his tremors with use of medication but reports worsening shuffling and difficulty with mobility. Patient has past medical history signifiant for Parkinsons; Arthritis, bladder cancer, Trigemic Neuralgia. He lives with wife in 1 level home and current using cane with reported recent fall.    Limitations Walking;Lifting;Standing;House hold activities    How long can you sit comfortably? No limits    How long can you stand comfortably? 15 min- limited due to back pain and foot numbess    How long  can you walk comfortably? about 80 feet    Patient Stated Goals Improve my balance, be able to get up off the floor so I can maybe work in my garage again.    Currently in Pain? No/denies               PRECAUTIONS: Falls   PAIN:  No pain reported. R calf pain earlier. No back pain today.    TODAY'S TREATMENT: 11/12/21  Therex:  Nustep HIIT training-  1 min L1 30 sec L4 Repeat for total of 8 min  Sit to stand x 15 reps with arms outstretched  holding onto rainbow ball   Forward steps in ladder in // bars - x 5 min of continuous walking - focusing on increasing step length  Seated hamstring stretch - hold 30 sec x 3   Seated trunk stretch rotation x 20 reps each direction  Lumbar flex stretch with theraball - forward lean x 25 reps (slow for adequate stretch)   Ambulation with upright 4WW with 3#AW - 150 feet in 2 min 38sec  Ambulation with upright 4WW without AW- 150 feet in 2 min 07 sec   Education provided throughout session via VC/TC and demonstration to facilitate movement at target joints and correct muscle activation for all testing and exercises performed.     PATIENT EDUCATION: Education details: form/technique with exercise Person educated: Patient Education method: Explanation, Demonstration, Tactile cues, and Verbal cues Education comprehension: verbalized understanding, returned demonstration, verbal cues required, tactile cues required, and needs further education   HOME EXERCISE PROGRAM: No updates today   PT Short Term Goals -       PT SHORT TERM GOAL #1   Title Pt will be independent with INITIAL  HEP in order to improve strength and balance in order to decrease fall risk and improve function at home and work.    Baseline 04/07/2021= No formal HEP in place. 05/11/2021= Patient reports doing pretty good- compliant with walking program and his stretching/strengthening.    Time 6    Period Weeks    Status Achieved    Target Date 05/19/21       PT SHORT TERM GOAL #2   Title Pt will decrease 5TSTS by at least 3 seconds in order to demonstrate clinically significant improvement in LE strength.    Baseline 04/07/2021= 25 sec; 05/11/2021= 18.65 sec    Time 6    Period Weeks    Status Achieved    Target Date 06/30/21  PT Long Term Goals -       PT LONG TERM GOAL #1   Title Pt will be independent with FINAL HEP in order to improve strength and balance in order to decrease fall risk and improve function at home and work.    Baseline 04/07/2021= No formal HEP in place. 07/01/2021=Patient reports performing walking and some standing LE strengthening exercises as instructed and states no questions at this time.  09/02/21: Pt reports he feels indep with exercises but is not performing HEP as much as he should   Time 12    Period Weeks    Status Partially Met    Target Date 09/23/21      PT LONG TERM GOAL #2   Title Pt will improve FOTO to target score of 45% to display perceived improvements in ability to complete ADL's.    Baseline 2/7/22023= 41%; FOTO=53%    Time 12    Period Weeks    Status Achieved    Target Date 06/30/21      PT LONG TERM GOAL #3   Title Pt will decrease 5TSTS by at least 5 seconds in order to demonstrate clinically significant improvement in LE strength.    Baseline 04/07/2021= 25 sec; 05/11/2021= 18.65 sec without UE support; 4/10= 13.76 sec without UE support    Time 12    Period Weeks    Status Achieved    Target Date 06/30/21      PT LONG TERM GOAL #4   Title Pt will decrease TUG to below 17 seconds/decrease in order to demonstrate decreased fall risk.    Baseline 04/07/2021= 21 sec with SPC; 05/11/2021= 18.85 sec with use of cane; 07/21/2021=18.88 sec  avg with use of cane; 7/5: 22.85 sec with QC; 09/29/2021= 21.45 sec using upright walker   Time 12    Period Weeks    Status Partially Met    Target Date 12/15/2021     PT LONG TERM GOAL #5   Title Pt will increase 10MWT by at least 0.15 m/s in  order to demonstrate clinically significant improvement in community ambulation.    Baseline 04/07/2021= 0.56 m/s using SPC; 05/11/2021= 0.54 m/s using SPC; 07/01/2021= 0.6 m/s/ 7/5: 0.48 m/s without AD but with CGA; 09/29/2021= 0.64 m/s   Time 12    Period Weeks    Status On-going    Target Date 12/15/2021     PT LONG TERM GOAL #6   Title Pt will increase 6MWT by at least 4m(1667f in order to demonstrate clinically significant improvement in cardiopulmonary endurance and community ambulation    Baseline 06/08/2021= 600 ft. in 5 min- Stopped due to fatigue using 4WW; 07/01/2021= 665 in 5 min 45 sec using 4WW; 7/5: 367 ft with QC; 09/29/2021= 740 feet using upright 4WW   Time 4    Period Weeks    Status On-going    Target Date 12/15/2021             Plan - 08/20/21 0939     Clinical Impression Statement Treatment focused more on flexibility today as patient continues to present with stiff natured walk. He was instructed in several self seated stretches and performed fairly well with VC and visual demonstration. He was limited due to tight hamstrings and trunk but did improve flexibility during session. Pt will continue to benefit from skilled Physical Therapy to address remaining goals and deficits and improve his mobility while decreasing his risk of falling and improving his quality of  life.    Personal Factors and Comorbidities Comorbidity 3+    Comorbidities arthritis, bladder cancer, Trigeminal Neuralgia    Examination-Activity Limitations Caring for Others;Carry;Continence;Lift;Squat;Stairs;Stand    Examination-Participation Restrictions Community Activity;Yard Work    [employer] Evolving/Moderate complexity    Rehab Potential Good    PT Frequency 2x / week    PT Duration 12 weeks    PT Treatment/Interventions ADLs/Self Care Home Management;Cryotherapy;Canalith Repostioning;Electrical Stimulation;Moist Heat;DME Instruction;Gait training;Stair training;Functional  mobility training;Therapeutic activities;Therapeutic exercise;Balance training;Neuromuscular re-education;Patient/family education;Manual techniques;Passive range of motion;Dry needling;Vestibular    PT Next Visit Plan LE strength, improve step length with ambulation, muscle tissue lengthening    PT Home Exercise Plan Access Code: [place] Bard Haupert, PT Physical Therapist- [facility]  11/13/21, 8:11 AM

## 2021-11-17 ENCOUNTER — Ambulatory Visit: Payer: Medicare HMO

## 2021-11-17 DIAGNOSIS — G8929 Other chronic pain: Secondary | ICD-10-CM

## 2021-11-17 DIAGNOSIS — R269 Unspecified abnormalities of gait and mobility: Secondary | ICD-10-CM

## 2021-11-17 DIAGNOSIS — R2681 Unsteadiness on feet: Secondary | ICD-10-CM

## 2021-11-17 DIAGNOSIS — R278 Other lack of coordination: Secondary | ICD-10-CM

## 2021-11-17 DIAGNOSIS — M6281 Muscle weakness (generalized): Secondary | ICD-10-CM

## 2021-11-17 DIAGNOSIS — R262 Difficulty in walking, not elsewhere classified: Secondary | ICD-10-CM | POA: Diagnosis not present

## 2021-11-17 DIAGNOSIS — R2689 Other abnormalities of gait and mobility: Secondary | ICD-10-CM

## 2021-11-17 NOTE — Therapy (Signed)
OUTPATIENT PHYSICAL THERAPY TREATMENT NOTE/Physical Therapy Progress Note   Dates of reporting period  10/08/2021   to   11/17/2021        Patient Name: [name] MRN: [id number] DOB:[birth year], 76 y.o., male Today's Date: 11/18/2021  PCP: [name]. [name], [place] PROVIDER: Dr. Gurney Maxin   PT End of Session - 11/17/21 0937     Visit Number 60    Number of Visits 91    Date for PT Re-Evaluation 12/15/21    Authorization Type [employer]    Authorization Time Period 07/01/21-09/20/21    Progress Note Due on Visit 33    PT Start Time 0932    PT Stop Time 1012    PT Time Calculation (min) 40 min    Equipment Utilized During Treatment Gait belt    Activity Tolerance Patient tolerated treatment well;No increased pain    Behavior During Therapy [facility] for tasks assessed/performed                    Past Medical History:  Diagnosis Date   Anxiety    Arteriosclerosis of coronary artery 11/16/2011   Overview:  Stent 10/2011 stent rca 2015 with collaterals to lad which is chronically occluded    Benign enlargement of prostate    Benign essential HTN 06/11/2014   Benign prostatic hypertrophy without urinary obstruction 07/31/2014   Bilateral cataracts 05/16/2013   Overview:  Dr. [facility]     Bone spur of foot    Left   BP (high blood pressure) 11/09/2012   Cancer ([place])    skin (forehead) and bladder   Carotid artery narrowing 02/08/2014   Depression    Detrusor hypertrophy    Diabetes ([place])    Diabetes mellitus, type 2 ([place]) 12/12/2012   Diverticulosis    Dyspnea    Esophageal reflux    Esophageal reflux    Fothergill's neuralgia 08/07/2012   Overview:  [facility]    Gastritis    GERD (gastroesophageal reflux disease)    Headache    cluster headaches   Healed myocardial infarct 11/09/2012   Hearing loss in left ear    Heart disease    Hematuria    Hemorrhoids    History of hiatal hernia 12/14/2017   small    Hypercholesteremia     Lesion of bladder    Myocardial infarct (HCC)    Presence of stent in coronary artery 11/09/2012   Rectal bleeding    Trigeminal neuralgia    Trigeminal neuralgia    Valvular heart disease    Vitamin D deficiency    Past Surgical History:  Procedure Laterality Date   APPENDECTOMY     BOTOX INJECTION N/A 12/21/2017   Procedure: Bladder BOTOX INJECTION;  Surgeon: [name], MD;  Location: [facility] ORS;  Service: Urology;  Laterality: N/A;   BOTOX INJECTION N/A 09/11/2018   Procedure: Bladder BOTOX INJECTION;  Surgeon: [name], MD;  Location: [facility] OR[facility];  Service: Urology;  Laterality: N/A;   CARDIAC CATHETERIZATION     CARDIAC CATHETERIZATION N/A 01/22/2015   Procedure: Left Heart Cath;  Surgeon: Corey Skains, MD;  Location: [place] [facility];  Service: Cardiovascular;  Laterality: N/A;   CARDIAC CATHETERIZATION N/A 01/22/2015   Procedure: Coronary Stent Intervention;  Surgeon: Isaias Cowman, MD;  Location: [place] [facility];  Service: Cardiovascular;  Laterality: N/A;   CATARACT EXTRACTION, BILATERAL     COLONOSCOPY WITH PROPOFOL N/A 12/08/2015   Procedure: COLONOSCOPY WITH  PROPOFOL;  Surgeon: Lollie Sails, MD;  Location: [facility] ENDOSCOPY;  Service: Endoscopy;  Laterality: N/A;   COLONOSCOPY WITH PROPOFOL N/A 12/09/2015   Procedure: COLONOSCOPY WITH PROPOFOL;  Surgeon: Lollie Sails, MD;  Location[facility] ENDOSCOPY;  Service: Endoscopy;  Laterality: N/A;   CORONARY ANGIOPLASTY     5 stents   CORONARY STENT PLACEMENT  2015   x5   CYSTOSCOPY N/A 09/11/2018   Procedure: CYSTOSCOPY;  Surgeon: [name], MD;  Location: [facility] ORS;  Service: Urology;  Laterality: N/A;   CYSTOSCOPY WITH BIOPSY N/A 12/21/2017   Procedure: CYSTOSCOPY WITH BIOPSY;  Surgeon: [name], MD;  Location: [facility] ORS;  Service: Urology;  Laterality: N/A;   ESOPHAGOGASTRODUODENOSCOPY (EGD) WITH PROPOFOL N/A 12/08/2015   Procedure: ESOPHAGOGASTRODUODENOSCOPY (EGD) WITH PROPOFOL;   Surgeon: Lollie Sails, MD;  Location[facility] ENDOSCOPY;  Service: Endoscopy;  Laterality: N/A;   ESOPHAGOGASTRODUODENOSCOPY (EGD) WITH PROPOFOL N/A 12/13/2017   Procedure: ESOPHAGOGASTRODUODENOSCOPY (EGD) WITH PROPOFOL;  Surgeon: Lollie Sails, MD;  Location: [facility] ENDOSCOPY;  Service: Endoscopy;  Laterality: N/A;   EYE SURGERY     HERNIA REPAIR     kidney tumor remove     TRANSURETHRAL RESECTION OF BLADDER TUMOR WITH GYRUS (TURBT-GYRUS)  16/1096   UMBILICAL HERNIA REPAIR     urethral meatotomy     Patient Active Problem List   Diagnosis Date Noted   Class 2 obesity due to excess calories with body mass index (BMI) of 36.0 to 36.9 in adult 04/11/2020   Swelling of limb 03/28/2020   Lymphedema 03/28/2020   Trigeminal neuralgia 12/25/2019   Lower limb ulcer, calf, left, limited to breakdown of skin ([place]) 12/25/2019   OSA (obstructive sleep apnea) 04/54/0981   Acute systolic CHF (congestive heart failure) ([place]) 12/12/2018   Bruising 07/10/2018   Diet-controlled type 2 diabetes mellitus ([place]) 03/24/2018   Microalbuminuria 03/24/2018   Dizziness 11/17/2017   Simple chronic bronchitis ([place]) 09/22/2017   SOBOE (shortness of breath on exertion) 02/18/2017   Chronic midline low back pain without sciatica 12/29/2016   Periodic limb movement disorder 12/29/2016   Benign essential tremor 06/22/2016   Pure hypercholesterolemia 06/20/2015   Erectile dysfunction due to arterial insufficiency 06/16/2015   Unstable angina ([place]) 01/22/2015   Abdominal aortic 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 rupture ([place]) 12/31/2014   Benign prostatic hypertrophy without urinary obstruction 07/31/2014   Enlarged prostate 07/31/2014   Benign essential HTN 06/11/2014   Carotid artery narrowing 02/08/2014   Carotid artery obstruction 02/08/2014   Bilateral carotid artery stenosis 02/08/2014   Chest pain 08/20/2013   Bilateral cataracts 05/16/2013   Cataract 05/16/2013   Clinical depression 12/12/2012   Diabetes  mellitus, type 2 ([place]) 12/12/2012   Combined fat and carbohydrate induced hyperlipemia 12/12/2012   Major depressive disorder, single episode, unspecified 12/12/2012   Acid reflux 11/09/2012   Presence of stent in coronary artery 11/09/2012   BP (high blood pressure) 11/09/2012   Healed myocardial infarct 11/09/2012   Gastro-esophageal reflux disease without esophagitis 11/09/2012   History of cardiovascular surgery 11/09/2012   Fothergill's neuralgia 08/07/2012   Swelling of testicle 04/06/2012   Disorder of male genital organ 04/06/2012   Swelling of the testicles 04/06/2012   Arteriosclerosis of coronary artery 11/16/2011   CAD in native artery 11/16/2011   Fatigue 06/04/2011   Avitaminosis D 11/30/2010    REFERRING DIAG: Repeated falls   THERAPY DIAG:   Difficulty in walking, not elsewhere classified  Muscle weakness (generalized)  Abnormality of gait and mobility  Other  lack of coordination  Other abnormalities of gait and mobility  Unsteadiness on feet  Chronic bilateral low back pain without sciatica  Rationale for Evaluation and Treatment Rehabilitation   Subjective Assessment - 09/22/21 0936     Subjective Pt reports having a good weekend overall- no falls and no pain.   Patient is accompained by: Family member -Wife   Pertinent History Patient is a 76 year old male with referral for Physical Therapy with diagnosis of Parkinsons disease and history of falling. He reports he diagnosed last year with Parkinsons and has improved his tremors with use of medication but reports worsening shuffling and difficulty with mobility. Patient has past medical history signifiant for Parkinsons; Arthritis, bladder cancer, Trigemic Neuralgia. He lives with wife in 1 level home and current using cane with reported recent fall.    Limitations Walking;Lifting;Standing;House hold activities    How long can you sit comfortably? No limits    How long can you stand comfortably? 15 min-  limited due to back pain and foot numbess    How long can you walk comfortably? about 80 feet    Patient Stated Goals Improve my balance, be able to get up off the floor so I can maybe work in my garage again.    Currently in Pain? No/denies               PRECAUTIONS: Falls   PAIN:  No pain reported. R calf pain earlier. No back pain today.    TODAY'S TREATMENT: 11/17/21  Reassessed GOALS for progress note # 60   Therex:   Self stretching instruction- Seated trunk rotation (left to right x 25 reps each) Self stretching: B Hamstring - hold 20 sec x 4 each LE Walking in // bars - focusing on larger steps and working on decreasing turning radius and time to turn down and back x 20    Education provided throughout session via VC/TC and demonstration to facilitate movement at target joints and correct muscle activation for all testing and exercises performed.     PATIENT EDUCATION: Education details: form/technique with exercise Person educated: Patient Education method: Explanation, Demonstration, Tactile cues, and Verbal cues Education comprehension: verbalized understanding, returned demonstration, verbal cues required, tactile cues required, and needs further education   HOME EXERCISE PROGRAM: No updates today   PT Short Term Goals -       PT SHORT TERM GOAL #1   Title Pt will be independent with INITIAL  HEP in order to improve strength and balance in order to decrease fall risk and improve function at home and work.    Baseline 04/07/2021= No formal HEP in place. 05/11/2021= Patient reports doing pretty good- compliant with walking program and his stretching/strengthening.    Time 6    Period Weeks    Status Achieved    Target Date 05/19/21      PT SHORT TERM GOAL #2   Title Pt will decrease 5TSTS by at least 3 seconds in order to demonstrate clinically significant improvement in LE strength.    Baseline 04/07/2021= 25 sec; 05/11/2021= 18.65 sec    Time 6     Period Weeks    Status Achieved    Target Date 06/30/21              PT Long Term Goals -       PT LONG TERM GOAL #1   Title Pt will be independent with FINAL HEP in order to improve strength and balance  in order to decrease fall risk and improve function at home and work.    Baseline 04/07/2021= No formal HEP in place. 07/01/2021=Patient reports performing walking and some standing LE strengthening exercises as instructed and states no questions at this time.  09/02/21: Pt reports he feels indep with exercises but is not performing HEP as much as he should; 11/17/2021= Patient reports compliant with current HEP- "I do them as best I can."   Time 12    Period Weeks    Status Partially Met    Target Date 09/23/21      PT LONG TERM GOAL #2   Title Pt will improve FOTO to target score of 45% to display perceived improvements in ability to complete ADL's.    Baseline 2/7/22023= 41%; FOTO=53%    Time 12    Period Weeks    Status Achieved    Target Date 06/30/21      PT LONG TERM GOAL #3   Title Pt will decrease 5TSTS by at least 5 seconds in order to demonstrate clinically significant improvement in LE strength.    Baseline 04/07/2021= 25 sec; 05/11/2021= 18.65 sec without UE support; 4/10= 13.76 sec without UE support    Time 12    Period Weeks    Status Achieved    Target Date 06/30/21      PT LONG TERM GOAL #4   Title Pt will decrease TUG to below 17 seconds/decrease in order to demonstrate decreased fall risk.    Baseline 04/07/2021= 21 sec with SPC; 05/11/2021= 18.85 sec with use of cane; 07/21/2021=18.88 sec  avg with use of cane; 7/5: 22.85 sec with QC; 09/29/2021= 21.45 sec using upright walker; 11/17/2021= 20. 46 sec using upright walker   Time 12    Period Weeks    Status Partially Met    Target Date 12/15/2021     PT LONG TERM GOAL #5   Title Pt will increase 10MWT by at least 0.15 m/s in order to demonstrate clinically significant improvement in community ambulation.    Baseline  04/07/2021= 0.56 m/s using SPC; 05/11/2021= 0.54 m/s using SPC; 07/01/2021= 0.6 m/s/ 7/5: 0.48 m/s without AD but with CGA; 09/29/2021= 0.64 m/s; 11/17/2021= 0.61 m/s using upright 4WW   Time 12    Period Weeks    Status On-going    Target Date 12/15/2021     PT LONG TERM GOAL #6   Title Pt will increase 6MWT by at least 109m(1669f in order to demonstrate clinically significant improvement in cardiopulmonary endurance and community ambulation    Baseline 06/08/2021= 600 ft. in 5 min- Stopped due to fatigue using 4WW; 07/01/2021= 665 in 5 min 45 sec using 4WW; 7/5: 367 ft with QC; 09/29/2021= 740 feet using upright 4WW; 11/17/2021= 705 feet using upright 4WW   Time 4    Period Weeks    Status On-going    Target Date 12/15/2021             Plan - 08/20/21 0939     Clinical Impression Statement Patient presents in good spirits and several goals were reassessed today. He presents with some progress from initial status with TUG and 10 meter walk test. He presents as much safer overall with use of upright 4WW overall despite only minimal improvement in gait speed. He presents with continued shuffling that worsens with increase distance- requiring ongoing cues to take longer steps. He is still making functional progress and his condition has the potential to improve in  response to therapy. Maximum improvement is yet to be obtained. The anticipated improvement is attainable and reasonable in a generally predictable time.  Pt will continue to benefit from skilled Physical Therapy to address remaining goals and deficits and improve his mobility while decreasing his risk of falling and improving his quality of life.    Personal Factors and Comorbidities Comorbidity 3+    Comorbidities arthritis, bladder cancer, Trigeminal Neuralgia    Examination-Activity Limitations Caring for Others;Carry;Continence;Lift;Squat;Stairs;Stand    Examination-Participation Restrictions Community Activity;Yard Work     [employer] Evolving/Moderate complexity    Rehab Potential Good    PT Frequency 2x / week    PT Duration 12 weeks    PT Treatment/Interventions ADLs/Self Care Home Management;Cryotherapy;Canalith Repostioning;Electrical Stimulation;Moist Heat;DME Instruction;Gait training;Stair training;Functional mobility training;Therapeutic activities;Therapeutic exercise;Balance training;Neuromuscular re-education;Patient/family education;Manual techniques;Passive range of motion;Dry needling;Vestibular    PT Next Visit Plan LE strength, improve step length with ambulation, muscle tissue lengthening    PT Home Exercise Plan Access Code: [place], PT Physical Therapist- [facility]  11/18/21, 10:02 AM

## 2021-11-19 ENCOUNTER — Ambulatory Visit: Payer: Medicare HMO

## 2021-11-19 DIAGNOSIS — R2689 Other abnormalities of gait and mobility: Secondary | ICD-10-CM

## 2021-11-19 DIAGNOSIS — R278 Other lack of coordination: Secondary | ICD-10-CM

## 2021-11-19 DIAGNOSIS — M545 Low back pain, unspecified: Secondary | ICD-10-CM

## 2021-11-19 DIAGNOSIS — M6281 Muscle weakness (generalized): Secondary | ICD-10-CM

## 2021-11-19 DIAGNOSIS — R2681 Unsteadiness on feet: Secondary | ICD-10-CM

## 2021-11-19 DIAGNOSIS — R262 Difficulty in walking, not elsewhere classified: Secondary | ICD-10-CM | POA: Diagnosis not present

## 2021-11-19 DIAGNOSIS — R269 Unspecified abnormalities of gait and mobility: Secondary | ICD-10-CM

## 2021-11-19 NOTE — Therapy (Signed)
OUTPATIENT PHYSICAL THERAPY TREATMENT NOTE      Patient Name: [name] MRN: [id number] DOB:Oct 22, 1945, 76 y.o., male Today's Date: 11/19/2021  PCP: Orene Desanctis. Moshe Cipro, FNP REFERRING PROVIDER: Dr. Theora Master   PT End of Session - 11/19/21 0940     Visit Number 61    Number of Visits 70    Date for PT Re-Evaluation 12/15/21    Authorization Type [employer]    Authorization Time Period 07/01/21-09/20/21    Progress Note Due on Visit 70    PT Start Time 0931    PT Stop Time 1014    PT Time Calculation (min) 43 min    Equipment Utilized During Treatment Gait belt    Activity Tolerance Patient tolerated treatment well;No increased pain    Behavior During Therapy [facility] for tasks assessed/performed                    Past Medical History:  Diagnosis Date   Anxiety    Arteriosclerosis of coronary artery 11/16/2011   Overview:  Stent 10/2011 stent rca 2015 with collaterals to lad which is chronically occluded    Benign enlargement of prostate    Benign essential HTN 06/11/2014   Benign prostatic hypertrophy without urinary obstruction 07/31/2014   Bilateral cataracts 05/16/2013   Overview:  [facility]     Bone spur of foot    Left   BP (high blood pressure) 11/09/2012   Cancer (HCC)    skin (forehead) and bladder   Carotid artery narrowing 02/08/2014   Depression    Detrusor hypertrophy    Diabetes (HCC)    Diabetes mellitus, type 2 (HCC) 12/12/2012   Diverticulosis    Dyspnea    Esophageal reflux    Esophageal reflux    Fothergill's neuralgia 08/07/2012   Overview:  [facility]    Gastritis    GERD (gastroesophageal reflux disease)    Headache    cluster headaches   Healed myocardial infarct 11/09/2012   Hearing loss in left ear    Heart disease    Hematuria    Hemorrhoids    History of hiatal hernia 12/14/2017   small    Hypercholesteremia    Lesion of bladder    Myocardial infarct (HCC)    Presence of stent in coronary artery  11/09/2012   Rectal bleeding    Trigeminal neuralgia    Trigeminal neuralgia    Valvular heart disease    Vitamin D deficiency    Past Surgical History:  Procedure Laterality Date   APPENDECTOMY     BOTOX INJECTION N/A 12/21/2017   Procedure: Bladder BOTOX INJECTION;  Surgeon: Vanna Scotland, MD;  Location: [facility] ORS;  Service: Urology;  Laterality: N/A;   BOTOX INJECTION N/A 09/11/2018   Procedure: Bladder BOTOX INJECTION;  Surgeon: Vanna Scotland, MD;  Location: [facility] ORS;  Service: Urology;  Laterality: N/A;   CARDIAC CATHETERIZATION     CARDIAC CATHETERIZATION N/A 01/22/2015   Procedure: Left Heart Cath;  Surgeon: Lamar Blinks, MD;  Location: [facility] INVASIVE CV LAB;  Service: Cardiovascular;  Laterality: N/A;   CARDIAC CATHETERIZATION N/A 01/22/2015   Procedure: Coronary Stent Intervention;  Surgeon: Marcina Millard, MD;  Location: [facility] INVASIVE CV LAB;  Service: Cardiovascular;  Laterality: N/A;   CATARACT EXTRACTION, BILATERAL     COLONOSCOPY WITH PROPOFOL N/A 12/08/2015   Procedure: COLONOSCOPY WITH PROPOFOL;  Surgeon: Christena Deem, MD;  Location: [facility] ENDOSCOPY;  Service: Endoscopy;  Laterality: N/A;  COLONOSCOPY WITH PROPOFOL N/A 12/09/2015   Procedure: COLONOSCOPY WITH PROPOFOL;  Surgeon: Lollie Sails, MD;  Location: [facility] ENDOSCOPY;  Service: Endoscopy;  Laterality: N/A;   CORONARY ANGIOPLASTY     5 stents   CORONARY STENT PLACEMENT  2015   x5   CYSTOSCOPY N/A 09/11/2018   Procedure: CYSTOSCOPY;  Surgeon: [name], MD;  Location: [facility] ORS;  Service: Urology;  Laterality: N/A;   CYSTOSCOPY WITH BIOPSY N/A 12/21/2017   Procedure: CYSTOSCOPY WITH BIOPSY;  Surgeon: [name], MD;  Location: [facility] ORS;  Service: Urology;  Laterality: N/A;   ESOPHAGOGASTRODUODENOSCOPY (EGD) WITH PROPOFOL N/A 12/08/2015   Procedure: ESOPHAGOGASTRODUODENOSCOPY (EGD) WITH PROPOFOL;  Surgeon: Lollie Sails, MD;  Location: [facility] ENDOSCOPY;  Service: Endoscopy;  Laterality:  N/A;   ESOPHAGOGASTRODUODENOSCOPY (EGD) WITH PROPOFOL N/A 12/13/2017   Procedure: ESOPHAGOGASTRODUODENOSCOPY (EGD) WITH PROPOFOL;  Surgeon: Lollie Sails, MD;  Location: [facility] ENDOSCOPY;  Service: Endoscopy;  Laterality: N/A;   EYE SURGERY     HERNIA REPAIR     kidney tumor remove     TRANSURETHRAL RESECTION OF BLADDER TUMOR WITH GYRUS (TURBT-GYRUS)  85/0277   UMBILICAL HERNIA REPAIR     urethral meatotomy     Patient Active Problem List   Diagnosis Date Noted   Class 2 obesity due to excess calories with body mass index (BMI) of 36.0 to 36.9 in adult 04/11/2020   Swelling of limb 03/28/2020   Lymphedema 03/28/2020   Trigeminal neuralgia 12/25/2019   Lower limb ulcer, calf, left, limited to breakdown of skin ([place]) 12/25/2019   OSA (obstructive sleep apnea) 41/28/7867   Acute systolic CHF (congestive heart failure) ([place]) 12/12/2018   Bruising 07/10/2018   Diet-controlled type 2 diabetes mellitus ([place]) 03/24/2018   Microalbuminuria 03/24/2018   Dizziness 11/17/2017   Simple chronic bronchitis ([place]) 09/22/2017   SOBOE (shortness of breath on exertion) 02/18/2017   Chronic midline low back pain without sciatica 12/29/2016   Periodic limb movement disorder 12/29/2016   Benign essential tremor 06/22/2016   Pure hypercholesterolemia 06/20/2015   Erectile dysfunction due to arterial insufficiency 06/16/2015   Unstable angina ([place]) 01/22/2015   Abdominal aortic 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 rupture ([place]) 12/31/2014   Benign prostatic hypertrophy without urinary obstruction 07/31/2014   Enlarged prostate 07/31/2014   Benign essential HTN 06/11/2014   Carotid artery narrowing 02/08/2014   Carotid artery obstruction 02/08/2014   Bilateral carotid artery stenosis 02/08/2014   Chest pain 08/20/2013   Bilateral cataracts 05/16/2013   Cataract 05/16/2013   Clinical depression 12/12/2012   Diabetes mellitus, type 2 ([place]) 12/12/2012   Combined fat and carbohydrate induced hyperlipemia  12/12/2012   Major depressive disorder, single episode, unspecified 12/12/2012   Acid reflux 11/09/2012   Presence of stent in coronary artery 11/09/2012   BP (high blood pressure) 11/09/2012   Healed myocardial infarct 11/09/2012   Gastro-esophageal reflux disease without esophagitis 11/09/2012   History of cardiovascular surgery 11/09/2012   Fothergill's neuralgia 08/07/2012   Swelling of testicle 04/06/2012   Disorder of male genital organ 04/06/2012   Swelling of the testicles 04/06/2012   Arteriosclerosis of coronary artery 11/16/2011   CAD in native artery 11/16/2011   Fatigue 06/04/2011   Avitaminosis D 11/30/2010    REFERRING DIAG: Repeated falls   THERAPY DIAG:   Difficulty in walking, not elsewhere classified  Muscle weakness (generalized)  Abnormality of gait and mobility  Other lack of coordination  Other abnormalities of gait and mobility  Unsteadiness on feet  Chronic bilateral low back  pain without sciatica  Rationale for Evaluation and Treatment Rehabilitation   Subjective Assessment - 09/22/21 0936     Subjective Pt reports that he exchanged his scooter for an off road option to better access around his home.    Patient is accompained by: Family member -Wife   Pertinent History Patient is a 76 year old male with referral for Physical Therapy with diagnosis of Parkinsons disease and history of falling. He reports he diagnosed last year with Parkinsons and has improved his tremors with use of medication but reports worsening shuffling and difficulty with mobility. Patient has past medical history signifiant for Parkinsons; Arthritis, bladder cancer, Trigemic Neuralgia. He lives with wife in 1 level home and current using cane with reported recent fall.    Limitations Walking;Lifting;Standing;House hold activities    How long can you sit comfortably? No limits    How long can you stand comfortably? 15 min- limited due to back pain and foot numbess    How  long can you walk comfortably? about 80 feet    Patient Stated Goals Improve my balance, be able to get up off the floor so I can maybe work in my garage again.    Currently in Pain? No/denies               PRECAUTIONS: Falls   PAIN:  No pain reported.     TODAY'S TREATMENT: 11/19/21   Therex:   Nustep for ROM/LE strengthening - L3 x 8 min = total distance = 0.25 mi  Self stretching instruction- Seated trunk rotation (left to right x 25 reps each) Self stretching: B Hamstring - hold 20 sec x 4 each LE Sit to stand with arms outstretched holding onto ball. Standing trunk twist- "Chop" motion x 15 reps each direction  Step up onto 6" block with blue airex pad with minimal UE support x 15 reps Step tap onto 6" block with blue airex pad on top and 2 hedgehog x 15 reps with minimal light UE touch.  Forward/retro step up/over 1/2 foam x 15 each direction (occasional use of railing)  Side step up/over 1/2 foam x 15 reps each direction (VC for wider step)     Education provided throughout session via VC/TC and demonstration to facilitate movement at target joints and correct muscle activation for all testing and exercises performed.     PATIENT EDUCATION: Education details: form/technique with exercise Person educated: Patient Education method: Explanation, Demonstration, Tactile cues, and Verbal cues Education comprehension: verbalized understanding, returned demonstration, verbal cues required, tactile cues required, and needs further education   HOME EXERCISE PROGRAM: No updates today   PT Short Term Goals -       PT SHORT TERM GOAL #1   Title Pt will be independent with INITIAL  HEP in order to improve strength and balance in order to decrease fall risk and improve function at home and work.    Baseline 04/07/2021= No formal HEP in place. 05/11/2021= Patient reports doing pretty good- compliant with walking program and his stretching/strengthening.    Time 6     Period Weeks    Status Achieved    Target Date 05/19/21      PT SHORT TERM GOAL #2   Title Pt will decrease 5TSTS by at least 3 seconds in order to demonstrate clinically significant improvement in LE strength.    Baseline 04/07/2021= 25 sec; 05/11/2021= 18.65 sec    Time 6    Period Weeks    Status  Achieved    Target Date 06/30/21              PT Long Term Goals -       PT LONG TERM GOAL #1   Title Pt will be independent with FINAL HEP in order to improve strength and balance in order to decrease fall risk and improve function at home and work.    Baseline 04/07/2021= No formal HEP in place. 07/01/2021=Patient reports performing walking and some standing LE strengthening exercises as instructed and states no questions at this time.  09/02/21: Pt reports he feels indep with exercises but is not performing HEP as much as he should; 11/17/2021= Patient reports compliant with current HEP- "I do them as best I can."   Time 12    Period Weeks    Status Partially Met    Target Date 09/23/21      PT LONG TERM GOAL #2   Title Pt will improve FOTO to target score of 45% to display perceived improvements in ability to complete ADL's.    Baseline 2/7/22023= 41%; FOTO=53%    Time 12    Period Weeks    Status Achieved    Target Date 06/30/21      PT LONG TERM GOAL #3   Title Pt will decrease 5TSTS by at least 5 seconds in order to demonstrate clinically significant improvement in LE strength.    Baseline 04/07/2021= 25 sec; 05/11/2021= 18.65 sec without UE support; 4/10= 13.76 sec without UE support    Time 12    Period Weeks    Status Achieved    Target Date 06/30/21      PT LONG TERM GOAL #4   Title Pt will decrease TUG to below 17 seconds/decrease in order to demonstrate decreased fall risk.    Baseline 04/07/2021= 21 sec with SPC; 05/11/2021= 18.85 sec with use of cane; 07/21/2021=18.88 sec  avg with use of cane; 7/5: 22.85 sec with QC; 09/29/2021= 21.45 sec using upright walker; 11/17/2021= 20. 46  sec using upright walker   Time 12    Period Weeks    Status Partially Met    Target Date 12/15/2021     PT LONG TERM GOAL #5   Title Pt will increase 10MWT by at least 0.15 m/s in order to demonstrate clinically significant improvement in community ambulation.    Baseline 04/07/2021= 0.56 m/s using SPC; 05/11/2021= 0.54 m/s using SPC; 07/01/2021= 0.6 m/s/ 7/5: 0.48 m/s without AD but with CGA; 09/29/2021= 0.64 m/s; 11/17/2021= 0.61 m/s using upright 4WW   Time 12    Period Weeks    Status On-going    Target Date 12/15/2021     PT LONG TERM GOAL #6   Title Pt will increase 6MWT by at least 61m (135ft) in order to demonstrate clinically significant improvement in cardiopulmonary endurance and community ambulation    Baseline 06/08/2021= 600 ft. in 5 min- Stopped due to fatigue using 4WW; 07/01/2021= 665 in 5 min 45 sec using 4WW; 7/5: 367 ft with QC; 09/29/2021= 740 feet using upright 4WW; 11/17/2021= 705 feet using upright 4WW   Time 4    Period Weeks    Status On-going    Target Date 12/15/2021             Plan - 08/20/21 0939     Clinical Impression Statement Patient performed well overall today- able to take higher and more consistent longer steps to avoid shuffle. He was able to sit to stand  better with more power and less hesitancy with B LE as well. Pt will continue to benefit from skilled Physical Therapy to address remaining goals and deficits and improve his mobility while decreasing his risk of falling and improving his quality of life.    Personal Factors and Comorbidities Comorbidity 3+    Comorbidities arthritis, bladder cancer, Trigeminal Neuralgia    Examination-Activity Limitations Caring for Others;Carry;Continence;Lift;Squat;Stairs;Stand    Examination-Participation Restrictions Community Activity;Yard Work    [employer] Evolving/Moderate complexity    Rehab Potential Good    PT Frequency 2x / week    PT Duration 12 weeks    PT  Treatment/Interventions ADLs/Self Care Home Management;Cryotherapy;Canalith Repostioning;Electrical Stimulation;Moist Heat;DME Instruction;Gait training;Stair training;Functional mobility training;Therapeutic activities;Therapeutic exercise;Balance training;Neuromuscular re-education;Patient/family education;Manual techniques;Passive range of motion;Dry needling;Vestibular    PT Next Visit Plan LE strength, improve step length with ambulation, muscle tissue lengthening    PT Home Exercise Plan Access Code: [place], PT Physical Therapist- [facility]  11/19/21, 3:29 PM

## 2021-11-24 ENCOUNTER — Ambulatory Visit: Payer: Medicare HMO

## 2021-11-24 DIAGNOSIS — R262 Difficulty in walking, not elsewhere classified: Secondary | ICD-10-CM | POA: Diagnosis not present

## 2021-11-24 DIAGNOSIS — M545 Low back pain, unspecified: Secondary | ICD-10-CM

## 2021-11-24 DIAGNOSIS — M6281 Muscle weakness (generalized): Secondary | ICD-10-CM

## 2021-11-24 DIAGNOSIS — R2681 Unsteadiness on feet: Secondary | ICD-10-CM

## 2021-11-24 DIAGNOSIS — R278 Other lack of coordination: Secondary | ICD-10-CM

## 2021-11-24 DIAGNOSIS — R2689 Other abnormalities of gait and mobility: Secondary | ICD-10-CM

## 2021-11-24 DIAGNOSIS — R269 Unspecified abnormalities of gait and mobility: Secondary | ICD-10-CM

## 2021-11-24 NOTE — Therapy (Signed)
OUTPATIENT PHYSICAL THERAPY TREATMENT NOTE      Patient Name: [name] MRN: [id number] DOB:1945/11/17, 76 y.o., male Today's Date: 11/25/2021  PCP: [name]. [name], [place] PROVIDER: Dr. Gurney Maxin   PT End of Session - 11/24/21 0929     Visit Number 38    Number of Visits 2    Date for PT Re-Evaluation 12/15/21    Authorization Type [employer]    Authorization Time Period 07/01/21-09/20/21    Progress Note Due on Visit 56    PT Start Time 0930    PT Stop Time 1014    PT Time Calculation (min) 44 min    Equipment Utilized During Treatment Gait belt    Activity Tolerance Patient tolerated treatment well;No increased pain    Behavior During Therapy [facility] for tasks assessed/performed                    Past Medical History:  Diagnosis Date   Anxiety    Arteriosclerosis of coronary artery 11/16/2011   Overview:  Stent 10/2011 stent rca 2015 with collaterals to lad which is chronically occluded    Benign enlargement of prostate    Benign essential HTN 06/11/2014   Benign prostatic hypertrophy without urinary obstruction 07/31/2014   Bilateral cataracts 05/16/2013   Overview:  Dr. [facility]     Bone spur of foot    Left   BP (high blood pressure) 11/09/2012   Cancer ([place])    skin (forehead) and bladder   Carotid artery narrowing 02/08/2014   Depression    Detrusor hypertrophy    Diabetes ([place])    Diabetes mellitus, type 2 ([place]) 12/12/2012   Diverticulosis    Dyspnea    Esophageal reflux    Esophageal reflux    Fothergill's neuralgia 08/07/2012   Overview:  [facility]    Gastritis    GERD (gastroesophageal reflux disease)    Headache    cluster headaches   Healed myocardial infarct 11/09/2012   Hearing loss in left ear    Heart disease    Hematuria    Hemorrhoids    History of hiatal hernia 12/14/2017   small    Hypercholesteremia    Lesion of bladder    Myocardial infarct (HCC)    Presence of stent in coronary artery  11/09/2012   Rectal bleeding    Trigeminal neuralgia    Trigeminal neuralgia    Valvular heart disease    Vitamin D deficiency    Past Surgical History:  Procedure Laterality Date   APPENDECTOMY     BOTOX INJECTION N/A 12/21/2017   Procedure: Bladder BOTOX INJECTION;  Surgeon: [name], MD;  Location: [facility] ORS;  Service: Urology;  Laterality: N/A;   BOTOX INJECTION N/A 09/11/2018   Procedure: Bladder BOTOX INJECTION;  Surgeon: [name], MD;  Location: [facility];  Service: Urology;  Laterality: N/A;   CARDIAC CATHETERIZATION     CARDIAC CATHETERIZATION N/A 01/22/2015   Procedure: Left Heart Cath;  Surgeon: Corey Skains, MD;  Location: [place] CV LAB;  Service: Cardiovascular;  Laterality: N/A;   CARDIAC CATHETERIZATION N/A 01/22/2015   Procedure: Coronary Stent Intervention;  Surgeon: Isaias Cowman, MD;  Location: [place] CV LAB;  Service: Cardiovascular;  Laterality: N/A;   CATARACT EXTRACTION, BILATERAL     COLONOSCOPY WITH PROPOFOL N/A 12/08/2015   Procedure: COLONOSCOPY WITH PROPOFOL;  Surgeon: Lollie Sails, MD;  Location: [facility];  Service: Endoscopy;  Laterality: N/A;  COLONOSCOPY WITH PROPOFOL N/A 12/09/2015   Procedure: COLONOSCOPY WITH PROPOFOL;  Surgeon: Lollie Sails, MD;  Location: [facility] ENDOSCOPY;  Service: Endoscopy;  Laterality: N/A;   CORONARY ANGIOPLASTY     5 stents   CORONARY STENT PLACEMENT  2015   x5   CYSTOSCOPY N/A 09/11/2018   Procedure: CYSTOSCOPY;  Surgeon: [name], MD;  Location: [facility];  Service: Urology;  Laterality: N/A;   CYSTOSCOPY WITH BIOPSY N/A 12/21/2017   Procedure: CYSTOSCOPY WITH BIOPSY;  Surgeon: [name], MD;  Location: [facility];  Service: Urology;  Laterality: N/A;   ESOPHAGOGASTRODUODENOSCOPY (EGD) WITH PROPOFOL N/A 12/08/2015   Procedure: ESOPHAGOGASTRODUODENOSCOPY (EGD) WITH PROPOFOL;  Surgeon: Lollie Sails, MD;  Location: [facility] ENDOSCOPY;  Service: Endoscopy;  Laterality:  N/A;   ESOPHAGOGASTRODUODENOSCOPY (EGD) WITH PROPOFOL N/A 12/13/2017   Procedure: ESOPHAGOGASTRODUODENOSCOPY (EGD) WITH PROPOFOL;  Surgeon: Lollie Sails, MD;  Location: [facility] ENDOSCOPY;  Service: Endoscopy;  Laterality: N/A;   EYE SURGERY     HERNIA REPAIR     kidney tumor remove     TRANSURETHRAL RESECTION OF BLADDER TUMOR WITH GYRUS (TURBT-GYRUS)  85/0277   UMBILICAL HERNIA REPAIR     urethral meatotomy     Patient Active Problem List   Diagnosis Date Noted   Class 2 obesity due to excess calories with body mass index (BMI) of 36.0 to 36.9 in adult 04/11/2020   Swelling of limb 03/28/2020   Lymphedema 03/28/2020   Trigeminal neuralgia 12/25/2019   Lower limb ulcer, calf, left, limited to breakdown of skin ([place]) 12/25/2019   OSA (obstructive sleep apnea) 41/28/7867   Acute systolic CHF (congestive heart failure) ([place]) 12/12/2018   Bruising 07/10/2018   Diet-controlled type 2 diabetes mellitus ([place]) 03/24/2018   Microalbuminuria 03/24/2018   Dizziness 11/17/2017   Simple chronic bronchitis ([place]) 09/22/2017   SOBOE (shortness of breath on exertion) 02/18/2017   Chronic midline low back pain without sciatica 12/29/2016   Periodic limb movement disorder 12/29/2016   Benign essential tremor 06/22/2016   Pure hypercholesterolemia 06/20/2015   Erectile dysfunction due to arterial insufficiency 06/16/2015   Unstable angina ([place]) 01/22/2015   Abdominal aortic 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 rupture ([place]) 12/31/2014   Benign prostatic hypertrophy without urinary obstruction 07/31/2014   Enlarged prostate 07/31/2014   Benign essential HTN 06/11/2014   Carotid artery narrowing 02/08/2014   Carotid artery obstruction 02/08/2014   Bilateral carotid artery stenosis 02/08/2014   Chest pain 08/20/2013   Bilateral cataracts 05/16/2013   Cataract 05/16/2013   Clinical depression 12/12/2012   Diabetes mellitus, type 2 ([place]) 12/12/2012   Combined fat and carbohydrate induced hyperlipemia  12/12/2012   Major depressive disorder, single episode, unspecified 12/12/2012   Acid reflux 11/09/2012   Presence of stent in coronary artery 11/09/2012   BP (high blood pressure) 11/09/2012   Healed myocardial infarct 11/09/2012   Gastro-esophageal reflux disease without esophagitis 11/09/2012   History of cardiovascular surgery 11/09/2012   Fothergill's neuralgia 08/07/2012   Swelling of testicle 04/06/2012   Disorder of male genital organ 04/06/2012   Swelling of the testicles 04/06/2012   Arteriosclerosis of coronary artery 11/16/2011   CAD in native artery 11/16/2011   Fatigue 06/04/2011   Avitaminosis D 11/30/2010    REFERRING DIAG: Repeated falls   THERAPY DIAG:   Difficulty in walking, not elsewhere classified  Muscle weakness (generalized)  Abnormality of gait and mobility  Other lack of coordination  Other abnormalities of gait and mobility  Unsteadiness on feet  Chronic bilateral low back  pain without sciatica  Rationale for Evaluation and Treatment Rehabilitation   Subjective Assessment - 09/22/21 0936     Subjective Pt reports having a good week and no falls in several weeks   Patient is accompained by: Family member -Wife   Pertinent History Patient is a 76 year old male with referral for Physical Therapy with diagnosis of Parkinsons disease and history of falling. He reports he diagnosed last year with Parkinsons and has improved his tremors with use of medication but reports worsening shuffling and difficulty with mobility. Patient has past medical history signifiant for Parkinsons; Arthritis, bladder cancer, Trigemic Neuralgia. He lives with wife in 1 level home and current using cane with reported recent fall.    Limitations Walking;Lifting;Standing;House hold activities    How long can you sit comfortably? No limits    How long can you stand comfortably? 15 min- limited due to back pain and foot numbess    How long can you walk comfortably? about 80  feet    Patient Stated Goals Improve my balance, be able to get up off the floor so I can maybe work in my garage again.    Currently in Pain? No/denies               PRECAUTIONS: Falls   PAIN:  No pain reported.     TODAY'S TREATMENT: 11/24/21   Therex:   Nustep for ROM/LE strengthening - L3 x 7 min = total distance = 0.20 mi  Seated Lumbar flex into ext (arms outstretched holding onto 2kg ball)  Self stretching: B Hamstring - hold 20 sec x 3 each LE (patient limited due to tightness) VC to keep back straight Sit to stand with arms outstretched holding onto 2kg ball x 10 reps. VC to lean forward for best technique Standing trunk twist with large step - down and back in // bars x 3 rounds. More difficulty twisting to left side  "Chop" motion x 15 reps each direction Lunge walk- in // bars - down and back with min UE support x 4 Calf press into toe raise x 25 reps. Step up onto 6" block with blue airex pad with minimal UE support x 15 reps- Min difficulty raising left LE at times - able to complete on his own with increased time.   Education provided throughout session via VC/TC and demonstration to facilitate movement at target joints and correct muscle activation for all testing and exercises performed.     PATIENT EDUCATION: Education details: form/technique with exercise Person educated: Patient Education method: Explanation, Demonstration, Tactile cues, and Verbal cues Education comprehension: verbalized understanding, returned demonstration, verbal cues required, tactile cues required, and needs further education   HOME EXERCISE PROGRAM: No updates today   PT Short Term Goals -       PT SHORT TERM GOAL #1   Title Pt will be independent with INITIAL  HEP in order to improve strength and balance in order to decrease fall risk and improve function at home and work.    Baseline 04/07/2021= No formal HEP in place. 05/11/2021= Patient reports doing pretty good-  compliant with walking program and his stretching/strengthening.    Time 6    Period Weeks    Status Achieved    Target Date 05/19/21      PT SHORT TERM GOAL #2   Title Pt will decrease 5TSTS by at least 3 seconds in order to demonstrate clinically significant improvement in LE strength.    Baseline 04/07/2021=  25 sec; 05/11/2021= 18.65 sec    Time 6    Period Weeks    Status Achieved    Target Date 06/30/21              PT Long Term Goals -       PT LONG TERM GOAL #1   Title Pt will be independent with FINAL HEP in order to improve strength and balance in order to decrease fall risk and improve function at home and work.    Baseline 04/07/2021= No formal HEP in place. 07/01/2021=Patient reports performing walking and some standing LE strengthening exercises as instructed and states no questions at this time.  09/02/21: Pt reports he feels indep with exercises but is not performing HEP as much as he should; 11/17/2021= Patient reports compliant with current HEP- "I do them as best I can."   Time 12    Period Weeks    Status Partially Met    Target Date 09/23/21      PT LONG TERM GOAL #2   Title Pt will improve FOTO to target score of 45% to display perceived improvements in ability to complete ADL's.    Baseline 2/7/22023= 41%; FOTO=53%    Time 12    Period Weeks    Status Achieved    Target Date 06/30/21      PT LONG TERM GOAL #3   Title Pt will decrease 5TSTS by at least 5 seconds in order to demonstrate clinically significant improvement in LE strength.    Baseline 04/07/2021= 25 sec; 05/11/2021= 18.65 sec without UE support; 4/10= 13.76 sec without UE support    Time 12    Period Weeks    Status Achieved    Target Date 06/30/21      PT LONG TERM GOAL #4   Title Pt will decrease TUG to below 17 seconds/decrease in order to demonstrate decreased fall risk.    Baseline 04/07/2021= 21 sec with SPC; 05/11/2021= 18.85 sec with use of cane; 07/21/2021=18.88 sec  avg with use of cane;  7/5: 22.85 sec with QC; 09/29/2021= 21.45 sec using upright walker; 11/17/2021= 20. 46 sec using upright walker   Time 12    Period Weeks    Status Partially Met    Target Date 12/15/2021     PT LONG TERM GOAL #5   Title Pt will increase 10MWT by at least 0.15 m/s in order to demonstrate clinically significant improvement in community ambulation.    Baseline 04/07/2021= 0.56 m/s using SPC; 05/11/2021= 0.54 m/s using SPC; 07/01/2021= 0.6 m/s/ 7/5: 0.48 m/s without AD but with CGA; 09/29/2021= 0.64 m/s; 11/17/2021= 0.61 m/s using upright 4WW   Time 12    Period Weeks    Status On-going    Target Date 12/15/2021     PT LONG TERM GOAL #6   Title Pt will increase 6MWT by at least 78m (139ft) in order to demonstrate clinically significant improvement in cardiopulmonary endurance and community ambulation    Baseline 06/08/2021= 600 ft. in 5 min- Stopped due to fatigue using 4WW; 07/01/2021= 665 in 5 min 45 sec using 4WW; 7/5: 367 ft with QC; 09/29/2021= 740 feet using upright 4WW; 11/17/2021= 705 feet using upright 4WW   Time 4    Period Weeks    Status On-going    Target Date 12/15/2021             Plan - 08/20/21 0939     Clinical Impression Statement Continued per plan of care  focusing on improving mobility and flexibility initially while progressing LE strength. He performed better with sit to stand with min VC and experienced slight more difficulty with Left LE vs. Right. Fatigued at end of session yet able to demo improved trunk rotation today. Pt will continue to benefit from skilled Physical Therapy to address remaining goals and deficits and improve his mobility while decreasing his risk of falling and improving his quality of life.    Personal Factors and Comorbidities Comorbidity 3+    Comorbidities arthritis, bladder cancer, Trigeminal Neuralgia    Examination-Activity Limitations Caring for Others;Carry;Continence;Lift;Squat;Stairs;Stand    Examination-Participation Restrictions Community  Activity;Yard Work    [employer] Evolving/Moderate complexity    Rehab Potential Good    PT Frequency 2x / week    PT Duration 12 weeks    PT Treatment/Interventions ADLs/Self Care Home Management;Cryotherapy;Canalith Repostioning;Electrical Stimulation;Moist Heat;DME Instruction;Gait training;Stair training;Functional mobility training;Therapeutic activities;Therapeutic exercise;Balance training;Neuromuscular re-education;Patient/family education;Manual techniques;Passive range of motion;Dry needling;Vestibular    PT Next Visit Plan LE strength, improve step length with ambulation, muscle tissue lengthening    PT Home Exercise Plan Access Code: [place], PT Physical Therapist- [facility]  11/25/21, 8:23 AM

## 2021-11-26 ENCOUNTER — Ambulatory Visit: Payer: Medicare HMO

## 2021-11-26 DIAGNOSIS — R269 Unspecified abnormalities of gait and mobility: Secondary | ICD-10-CM

## 2021-11-26 DIAGNOSIS — M6281 Muscle weakness (generalized): Secondary | ICD-10-CM

## 2021-11-26 DIAGNOSIS — R2681 Unsteadiness on feet: Secondary | ICD-10-CM

## 2021-11-26 DIAGNOSIS — R262 Difficulty in walking, not elsewhere classified: Secondary | ICD-10-CM

## 2021-11-26 DIAGNOSIS — M545 Low back pain, unspecified: Secondary | ICD-10-CM

## 2021-11-26 DIAGNOSIS — R2689 Other abnormalities of gait and mobility: Secondary | ICD-10-CM

## 2021-11-26 DIAGNOSIS — R278 Other lack of coordination: Secondary | ICD-10-CM

## 2021-11-26 NOTE — Therapy (Addendum)
OUTPATIENT PHYSICAL THERAPY TREATMENT NOTE      Patient Name: [name] MRN: [id number] DOB:[birth year], 76 y.o., male Today's Date: 11/27/2021  PCP: [name]. [name], [place] PROVIDER: Dr. Gurney Maxin   PT End of Session - 11/26/21 0936     Visit Number 63    Number of Visits 46    Date for PT Re-Evaluation 12/15/21    Authorization Type [employer]    Authorization Time Period 07/01/21-09/20/21    Progress Note Due on Visit 33    PT Start Time 0930    PT Stop Time 1013    PT Time Calculation (min) 43 min    Equipment Utilized During Treatment Gait belt    Activity Tolerance Patient tolerated treatment well;No increased pain    Behavior During Therapy [facility] for tasks assessed/performed                    Past Medical History:  Diagnosis Date   Anxiety    Arteriosclerosis of coronary artery 11/16/2011   Overview:  Stent 10/2011 stent rca 2015 with collaterals to lad which is chronically occluded    Benign enlargement of prostate    Benign essential HTN 06/11/2014   Benign prostatic hypertrophy without urinary obstruction 07/31/2014   Bilateral cataracts 05/16/2013   Overview:  Dr. [facility]     Bone spur of foot    Left   BP (high blood pressure) 11/09/2012   Cancer ([place])    skin (forehead) and bladder   Carotid artery narrowing 02/08/2014   Depression    Detrusor hypertrophy    Diabetes ([place])    Diabetes mellitus, type 2 ([place]) 12/12/2012   Diverticulosis    Dyspnea    Esophageal reflux    Esophageal reflux    Fothergill's neuralgia 08/07/2012   Overview:  [facility]    Gastritis    GERD (gastroesophageal reflux disease)    Headache    cluster headaches   Healed myocardial infarct 11/09/2012   Hearing loss in left ear    Heart disease    Hematuria    Hemorrhoids    History of hiatal hernia 12/14/2017   small    Hypercholesteremia    Lesion of bladder    Myocardial infarct (HCC)    Presence of stent in coronary artery  11/09/2012   Rectal bleeding    Trigeminal neuralgia    Trigeminal neuralgia    Valvular heart disease    Vitamin D deficiency    Past Surgical History:  Procedure Laterality Date   APPENDECTOMY     BOTOX INJECTION N/A 12/21/2017   Procedure: Bladder BOTOX INJECTION;  Surgeon: [name], MD;  Location: [facility] ORS;  Service: Urology;  Laterality: N/A;   BOTOX INJECTION N/A 09/11/2018   Procedure: Bladder BOTOX INJECTION;  Surgeon: [name], MD;  Location: [facility] ORS;  Service: Urology;  Laterality: N/A;   CARDIAC CATHETERIZATION     CARDIAC CATHETERIZATION N/A 01/22/2015   Procedure: Left Heart Cath;  Surgeon: Corey Skains, MD;  Location: [place] CV LAB;  Service: Cardiovascular;  Laterality: N/A;   CARDIAC CATHETERIZATION N/A 01/22/2015   Procedure: Coronary Stent Intervention;  Surgeon: Isaias Cowman, MD;  Location: [place] CV LAB;  Service: Cardiovascular;  Laterality: N/A;   CATARACT EXTRACTION, BILATERAL     COLONOSCOPY WITH PROPOFOL N/A 12/08/2015   Procedure: COLONOSCOPY WITH PROPOFOL;  Surgeon: Lollie Sails, MD;  Location: [facility]COPY;  Service: Endoscopy;  Laterality: N/A;  COLONOSCOPY WITH PROPOFOL N/A 12/09/2015   Procedure: COLONOSCOPY WITH PROPOFOL;  Surgeon: Lollie Sails, MD;  Location: [facility] ENDOSCOPY;  Service: Endoscopy;  Laterality: N/A;   CORONARY ANGIOPLASTY     5 stents   CORONARY STENT PLACEMENT  2015   x5   CYSTOSCOPY N/A 09/11/2018   Procedure: CYSTOSCOPY;  Surgeon: [name], MD;  Location: [facility] ORS;  Service: Urology;  Laterality: N/A;   CYSTOSCOPY WITH BIOPSY N/A 12/21/2017   Procedure: CYSTOSCOPY WITH BIOPSY;  Surgeon: [name], MD;  Location: [facility] ORS;  Service: Urology;  Laterality: N/A;   ESOPHAGOGASTRODUODENOSCOPY (EGD) WITH PROPOFOL N/A 12/08/2015   Procedure: ESOPHAGOGASTRODUODENOSCOPY (EGD) WITH PROPOFOL;  Surgeon: Lollie Sails, MD;  Location: [facility] ENDOSCOPY;  Service: Endoscopy;  Laterality:  N/A;   ESOPHAGOGASTRODUODENOSCOPY (EGD) WITH PROPOFOL N/A 12/13/2017   Procedure: ESOPHAGOGASTRODUODENOSCOPY (EGD) WITH PROPOFOL;  Surgeon: Lollie Sails, MD;  Location: [facility] ENDOSCOPY;  Service: Endoscopy;  Laterality: N/A;   EYE SURGERY     HERNIA REPAIR     kidney tumor remove     TRANSURETHRAL RESECTION OF BLADDER TUMOR WITH GYRUS (TURBT-GYRUS)  85/0277   UMBILICAL HERNIA REPAIR     urethral meatotomy     Patient Active Problem List   Diagnosis Date Noted   Class 2 obesity due to excess calories with body mass index (BMI) of 36.0 to 36.9 in adult 04/11/2020   Swelling of limb 03/28/2020   Lymphedema 03/28/2020   Trigeminal neuralgia 12/25/2019   Lower limb ulcer, calf, left, limited to breakdown of skin ([place]) 12/25/2019   OSA (obstructive sleep apnea) 41/28/7867   Acute systolic CHF (congestive heart failure) ([place]) 12/12/2018   Bruising 07/10/2018   Diet-controlled type 2 diabetes mellitus ([place]) 03/24/2018   Microalbuminuria 03/24/2018   Dizziness 11/17/2017   Simple chronic bronchitis ([place]) 09/22/2017   SOBOE (shortness of breath on exertion) 02/18/2017   Chronic midline low back pain without sciatica 12/29/2016   Periodic limb movement disorder 12/29/2016   Benign essential tremor 06/22/2016   Pure hypercholesterolemia 06/20/2015   Erectile dysfunction due to arterial insufficiency 06/16/2015   Unstable angina ([place]) 01/22/2015   Abdominal aortic 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 rupture ([place]) 12/31/2014   Benign prostatic hypertrophy without urinary obstruction 07/31/2014   Enlarged prostate 07/31/2014   Benign essential HTN 06/11/2014   Carotid artery narrowing 02/08/2014   Carotid artery obstruction 02/08/2014   Bilateral carotid artery stenosis 02/08/2014   Chest pain 08/20/2013   Bilateral cataracts 05/16/2013   Cataract 05/16/2013   Clinical depression 12/12/2012   Diabetes mellitus, type 2 ([place]) 12/12/2012   Combined fat and carbohydrate induced hyperlipemia  12/12/2012   Major depressive disorder, single episode, unspecified 12/12/2012   Acid reflux 11/09/2012   Presence of stent in coronary artery 11/09/2012   BP (high blood pressure) 11/09/2012   Healed myocardial infarct 11/09/2012   Gastro-esophageal reflux disease without esophagitis 11/09/2012   History of cardiovascular surgery 11/09/2012   Fothergill's neuralgia 08/07/2012   Swelling of testicle 04/06/2012   Disorder of male genital organ 04/06/2012   Swelling of the testicles 04/06/2012   Arteriosclerosis of coronary artery 11/16/2011   CAD in native artery 11/16/2011   Fatigue 06/04/2011   Avitaminosis D 11/30/2010    REFERRING DIAG: Repeated falls   THERAPY DIAG:   Difficulty in walking, not elsewhere classified  Muscle weakness (generalized)  Abnormality of gait and mobility  Other lack of coordination  Other abnormalities of gait and mobility  Unsteadiness on feet  Chronic bilateral low back  pain without sciatica  Rationale for Evaluation and Treatment Rehabilitation   Subjective Assessment - 09/22/21 0936     Subjective Pt reports doing okay- moving a little slower today but overall doing better.    Patient is accompained by: Family member -Wife   Pertinent History Patient is a 76 year old male with referral for Physical Therapy with diagnosis of Parkinsons disease and history of falling. He reports he diagnosed last year with Parkinsons and has improved his tremors with use of medication but reports worsening shuffling and difficulty with mobility. Patient has past medical history signifiant for Parkinsons; Arthritis, bladder cancer, Trigemic Neuralgia. He lives with wife in 1 level home and current using cane with reported recent fall.    Limitations Walking;Lifting;Standing;House hold activities    How long can you sit comfortably? No limits    How long can you stand comfortably? 15 min- limited due to back pain and foot numbess    How long can you walk  comfortably? about 80 feet    Patient Stated Goals Improve my balance, be able to get up off the floor so I can maybe work in my garage again.    Currently in Pain? No/denies               PRECAUTIONS: Falls   PAIN:  No pain reported.     TODAY'S TREATMENT: 11/24/21   Therex:   Nustep for ROM/LE strengthening - L1 x 1 min and L3 x 7 min = total distance = 0.20 mi Step over green therapad - forward/backward - with 2.5 # AW x 12 reps (min to no UE support)  Step tap (3 way- ant/post/lat) onto 3 hedgehogs x 15 reps each direction (VC for leg placement)  Sit to stand with arms outstretched holding onto 2kg ball x 10 reps. VC to lean forward for best technique Standing on 1/2 foam (curve end down) - static stand (ankle stabilizing) x 30 sec x 3.  Lunge walk- in // bars - down and back with min UE support x 4 Calf press into toe raise x 25 reps. (BUE Support)  Seated lumbar flex into ext with arms extended holding onto 2kg ball x 15 reps Step up onto 6" block with blue airex pad with minimal UE support x 15 reps- Min difficulty raising left LE at times - able to complete on his own with increased time.   Education provided throughout session via VC/TC and demonstration to facilitate movement at target joints and correct muscle activation for all testing and exercises performed.     PATIENT EDUCATION: Education details: form/technique with exercise Person educated: Patient Education method: Explanation, Demonstration, Tactile cues, and Verbal cues Education comprehension: verbalized understanding, returned demonstration, verbal cues required, tactile cues required, and needs further education   HOME EXERCISE PROGRAM: No updates today   PT Short Term Goals -       PT SHORT TERM GOAL #1   Title Pt will be independent with INITIAL  HEP in order to improve strength and balance in order to decrease fall risk and improve function at home and work.    Baseline 04/07/2021= No  formal HEP in place. 05/11/2021= Patient reports doing pretty good- compliant with walking program and his stretching/strengthening.    Time 6    Period Weeks    Status Achieved    Target Date 05/19/21      PT SHORT TERM GOAL #2   Title Pt will decrease 5TSTS by at least 3  seconds in order to demonstrate clinically significant improvement in LE strength.    Baseline 04/07/2021= 25 sec; 05/11/2021= 18.65 sec    Time 6    Period Weeks    Status Achieved    Target Date 06/30/21              PT Long Term Goals -       PT LONG TERM GOAL #1   Title Pt will be independent with FINAL HEP in order to improve strength and balance in order to decrease fall risk and improve function at home and work.    Baseline 04/07/2021= No formal HEP in place. 07/01/2021=Patient reports performing walking and some standing LE strengthening exercises as instructed and states no questions at this time.  09/02/21: Pt reports he feels indep with exercises but is not performing HEP as much as he should; 11/17/2021= Patient reports compliant with current HEP- "I do them as best I can."   Time 12    Period Weeks    Status Partially Met    Target Date 09/23/21      PT LONG TERM GOAL #2   Title Pt will improve FOTO to target score of 45% to display perceived improvements in ability to complete ADL's.    Baseline 2/7/22023= 41%; FOTO=53%    Time 12    Period Weeks    Status Achieved    Target Date 06/30/21      PT LONG TERM GOAL #3   Title Pt will decrease 5TSTS by at least 5 seconds in order to demonstrate clinically significant improvement in LE strength.    Baseline 04/07/2021= 25 sec; 05/11/2021= 18.65 sec without UE support; 4/10= 13.76 sec without UE support    Time 12    Period Weeks    Status Achieved    Target Date 06/30/21      PT LONG TERM GOAL #4   Title Pt will decrease TUG to below 17 seconds/decrease in order to demonstrate decreased fall risk.    Baseline 04/07/2021= 21 sec with SPC; 05/11/2021= 18.85  sec with use of cane; 07/21/2021=18.88 sec  avg with use of cane; 7/5: 22.85 sec with QC; 09/29/2021= 21.45 sec using upright walker; 11/17/2021= 20. 46 sec using upright walker   Time 12    Period Weeks    Status Partially Met    Target Date 12/15/2021     PT LONG TERM GOAL #5   Title Pt will increase 10MWT by at least 0.15 m/s in order to demonstrate clinically significant improvement in community ambulation.    Baseline 04/07/2021= 0.56 m/s using SPC; 05/11/2021= 0.54 m/s using SPC; 07/01/2021= 0.6 m/s/ 7/5: 0.48 m/s without AD but with CGA; 09/29/2021= 0.64 m/s; 11/17/2021= 0.61 m/s using upright 4WW   Time 12    Period Weeks    Status On-going    Target Date 12/15/2021     PT LONG TERM GOAL #6   Title Pt will increase 6MWT by at least 78m(1628f in order to demonstrate clinically significant improvement in cardiopulmonary endurance and community ambulation    Baseline 06/08/2021= 600 ft. in 5 min- Stopped due to fatigue using 4WW; 07/01/2021= 665 in 5 min 45 sec using 4WW; 7/5: 367 ft with QC; 09/29/2021= 740 feet using upright 4WW; 11/17/2021= 705 feet using upright 4WW   Time 4    Period Weeks    Status On-going    Target Date 12/15/2021  Plan - 08/20/21 0939     Clinical Impression Statement Patient presented with good overall mobilty- picking up BLE well with therex today except some fatigue with left LE with step up. He mobilized well with side step and later 3 way hip mobility with VC for technique.  Pt will continue to benefit from skilled Physical Therapy to address remaining goals and deficits and improve his mobility while decreasing his risk of falling and improving his quality of life.    Personal Factors and Comorbidities Comorbidity 3+    Comorbidities arthritis, bladder cancer, Trigeminal Neuralgia    Examination-Activity Limitations Caring for Others;Carry;Continence;Lift;Squat;Stairs;Stand    Examination-Participation Restrictions Community Activity;Yard Work     [employer] Evolving/Moderate complexity    Rehab Potential Good    PT Frequency 2x / week    PT Duration 12 weeks    PT Treatment/Interventions ADLs/Self Care Home Management;Cryotherapy;Canalith Repostioning;Electrical Stimulation;Moist Heat;DME Instruction;Gait training;Stair training;Functional mobility training;Therapeutic activities;Therapeutic exercise;Balance training;Neuromuscular re-education;Patient/family education;Manual techniques;Passive range of motion;Dry needling;Vestibular    PT Next Visit Plan LE strength, improve step length with ambulation, muscle tissue lengthening    PT Home Exercise Plan Access Code: [place], PT Physical Therapist- [facility]  11/27/21, 11:58 AM

## 2021-11-30 NOTE — Therapy (Signed)
OUTPATIENT PHYSICAL THERAPY TREATMENT NOTE      Patient Name: [name] MRN: [id number] DOB:1945-07-29, 76 y.o., male Today's Date: 12/01/2021  PCP: [name]. [name], [place] PROVIDER: Dr. Gurney Maxin   PT End of Session - 12/01/21 0938     Visit Number 33    Number of Visits 22    Date for PT Re-Evaluation 12/15/21    Authorization Type [employer]    Authorization Time Period 07/01/21-09/20/21    Progress Note Due on Visit 36    PT Start Time 0930    PT Stop Time 1014    PT Time Calculation (min) 44 min    Equipment Utilized During Treatment Gait belt    Activity Tolerance Patient tolerated treatment well;No increased pain    Behavior During Therapy [facility] for tasks assessed/performed                     Past Medical History:  Diagnosis Date   Anxiety    Arteriosclerosis of coronary artery 11/16/2011   Overview:  Stent 10/2011 stent rca 2015 with collaterals to lad which is chronically occluded    Benign enlargement of prostate    Benign essential HTN 06/11/2014   Benign prostatic hypertrophy without urinary obstruction 07/31/2014   Bilateral cataracts 05/16/2013   Overview:  Dr. [facility]     Bone spur of foot    Left   BP (high blood pressure) 11/09/2012   Cancer ([place])    skin (forehead) and bladder   Carotid artery narrowing 02/08/2014   Depression    Detrusor hypertrophy    Diabetes ([place])    Diabetes mellitus, type 2 ([place]) 12/12/2012   Diverticulosis    Dyspnea    Esophageal reflux    Esophageal reflux    Fothergill's neuralgia 08/07/2012   Overview:  [facility]    Gastritis    GERD (gastroesophageal reflux disease)    Headache    cluster headaches   Healed myocardial infarct 11/09/2012   Hearing loss in left ear    Heart disease    Hematuria    Hemorrhoids    History of hiatal hernia 12/14/2017   small    Hypercholesteremia    Lesion of bladder    Myocardial infarct (HCC)    Presence of stent in coronary artery  11/09/2012   Rectal bleeding    Trigeminal neuralgia    Trigeminal neuralgia    Valvular heart disease    Vitamin D deficiency    Past Surgical History:  Procedure Laterality Date   APPENDECTOMY     BOTOX INJECTION N/A 12/21/2017   Procedure: Bladder BOTOX INJECTION;  Surgeon: [name], MD;  Location: [facility];  Service: Urology;  Laterality: N/A;   BOTOX INJECTION N/A 09/11/2018   Procedure: Bladder BOTOX INJECTION;  Surgeon: [name], MD;  Location: [facility];  Service: Urology;  Laterality: N/A;   CARDIAC CATHETERIZATION     CARDIAC CATHETERIZATION N/A 01/22/2015   Procedure: Left Heart Cath;  Surgeon: Corey Skains, MD;  Location: [place] [facility];  Service: Cardiovascular;  Laterality: N/A;   CARDIAC CATHETERIZATION N/A 01/22/2015   Procedure: Coronary Stent Intervention;  Surgeon: Isaias Cowman, MD;  Location: [place] [facility];  Service: Cardiovascular;  Laterality: N/A;   CATARACT EXTRACTION, BILATERAL     COLONOSCOPY WITH PROPOFOL N/A 12/08/2015   Procedure: COLONOSCOPY WITH PROPOFOL;  Surgeon: Lollie Sails, MD;  Location: [facility] ENDOSCOPY;  Service: Endoscopy;  Laterality: N/A;  COLONOSCOPY WITH PROPOFOL N/A 12/09/2015   Procedure: COLONOSCOPY WITH PROPOFOL;  Surgeon: Lollie Sails, MD;  Location: [facility] ENDOSCOPY;  Service: Endoscopy;  Laterality: N/A;   CORONARY ANGIOPLASTY     5 stents   CORONARY STENT PLACEMENT  2015   x5   CYSTOSCOPY N/A 09/11/2018   Procedure: CYSTOSCOPY;  Surgeon: [name], MD;  Location: [facility];  Service: Urology;  Laterality: N/A;   CYSTOSCOPY WITH BIOPSY N/A 12/21/2017   Procedure: CYSTOSCOPY WITH BIOPSY;  Surgeon: [name], MD;  Location: [facility];  Service: Urology;  Laterality: N/A;   ESOPHAGOGASTRODUODENOSCOPY (EGD) WITH PROPOFOL N/A 12/08/2015   Procedure: ESOPHAGOGASTRODUODENOSCOPY (EGD) WITH PROPOFOL;  Surgeon: Lollie Sails, MD;  Location: [facility] ENDOSCOPY;  Service: Endoscopy;  Laterality:  N/A;   ESOPHAGOGASTRODUODENOSCOPY (EGD) WITH PROPOFOL N/A 12/13/2017   Procedure: ESOPHAGOGASTRODUODENOSCOPY (EGD) WITH PROPOFOL;  Surgeon: Lollie Sails, MD;  Location: [facility] ENDOSCOPY;  Service: Endoscopy;  Laterality: N/A;   EYE SURGERY     HERNIA REPAIR     kidney tumor remove     TRANSURETHRAL RESECTION OF BLADDER TUMOR WITH GYRUS (TURBT-GYRUS)  85/0277   UMBILICAL HERNIA REPAIR     urethral meatotomy     Patient Active Problem List   Diagnosis Date Noted   Class 2 obesity due to excess calories with body mass index (BMI) of 36.0 to 36.9 in adult 04/11/2020   Swelling of limb 03/28/2020   Lymphedema 03/28/2020   Trigeminal neuralgia 12/25/2019   Lower limb ulcer, calf, left, limited to breakdown of skin ([place]) 12/25/2019   OSA (obstructive sleep apnea) 41/28/7867   Acute systolic CHF (congestive heart failure) ([place]) 12/12/2018   Bruising 07/10/2018   Diet-controlled type 2 diabetes mellitus ([place]) 03/24/2018   Microalbuminuria 03/24/2018   Dizziness 11/17/2017   Simple chronic bronchitis ([place]) 09/22/2017   SOBOE (shortness of breath on exertion) 02/18/2017   Chronic midline low back pain without sciatica 12/29/2016   Periodic limb movement disorder 12/29/2016   Benign essential tremor 06/22/2016   Pure hypercholesterolemia 06/20/2015   Erectile dysfunction due to arterial insufficiency 06/16/2015   Unstable angina ([place]) 01/22/2015   Abdominal aortic 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 rupture ([place]) 12/31/2014   Benign prostatic hypertrophy without urinary obstruction 07/31/2014   Enlarged prostate 07/31/2014   Benign essential HTN 06/11/2014   Carotid artery narrowing 02/08/2014   Carotid artery obstruction 02/08/2014   Bilateral carotid artery stenosis 02/08/2014   Chest pain 08/20/2013   Bilateral cataracts 05/16/2013   Cataract 05/16/2013   Clinical depression 12/12/2012   Diabetes mellitus, type 2 ([place]) 12/12/2012   Combined fat and carbohydrate induced hyperlipemia  12/12/2012   Major depressive disorder, single episode, unspecified 12/12/2012   Acid reflux 11/09/2012   Presence of stent in coronary artery 11/09/2012   BP (high blood pressure) 11/09/2012   Healed myocardial infarct 11/09/2012   Gastro-esophageal reflux disease without esophagitis 11/09/2012   History of cardiovascular surgery 11/09/2012   Fothergill's neuralgia 08/07/2012   Swelling of testicle 04/06/2012   Disorder of male genital organ 04/06/2012   Swelling of the testicles 04/06/2012   Arteriosclerosis of coronary artery 11/16/2011   CAD in native artery 11/16/2011   Fatigue 06/04/2011   Avitaminosis D 11/30/2010    REFERRING DIAG: Repeated falls   THERAPY DIAG:   Difficulty in walking, not elsewhere classified  Muscle weakness (generalized)  Abnormality of gait and mobility  Other lack of coordination  Other abnormalities of gait and mobility  Unsteadiness on feet  Chronic bilateral low back  pain without sciatica  Rationale for Evaluation and Treatment Rehabilitation   Subjective Assessment - 09/22/21 0936     Subjective Pt reports another fall yesterday- in kitchen walking around without an AD and turned and lost his balance. .    Patient is accompained by: Family member -Wife   Pertinent History Patient is a 76 year old male with referral for Physical Therapy with diagnosis of Parkinsons disease and history of falling. He reports he diagnosed last year with Parkinsons and has improved his tremors with use of medication but reports worsening shuffling and difficulty with mobility. Patient has past medical history signifiant for Parkinsons; Arthritis, bladder cancer, Trigemic Neuralgia. He lives with wife in 1 level home and current using cane with reported recent fall.    Limitations Walking;Lifting;Standing;House hold activities    How long can you sit comfortably? No limits    How long can you stand comfortably? 15 min- limited due to back pain and foot numbess     How long can you walk comfortably? about 80 feet    Patient Stated Goals Improve my balance, be able to get up off the floor so I can maybe work in my garage again.    Currently in Pain? No/denies               PRECAUTIONS: Falls   PAIN:  No pain reported.     TODAY'S TREATMENT: 11/24/21   Therex:   Ambulation in clinic as patient had to to to restroom upon arrival- Amb 200 feet with upright 4WW and 3# AW BLE- CGA and VC for step length. Forward/retro gait in // bars - down and back x 8 trials (counting each step- mostly between 7-9 steps total each way)  Progressive step up/down  in // bars - 1) airex pad 2) 6" block 3) 12 " block then back x 5 trials (Patient reports as very hard) .  Forward lean and reach for cone in // bars- Patient unable to perform without UE support x 15 reps each side.   Step over green therapad - forward/backward - with 2.5 # AW x 12 reps (min to no UE support) Step tap (3 way- ant/post/lat) onto 3 hedgehogs x 15 reps each direction (VC for leg placement)  Sit to stand with arms outstretched holding onto 2kg ball x 10 reps. VC to lean forward for best technique Calf press into toe raise x 25 reps. (BUE Support)  Achilles/soleus stretch- 1 foot on ramp incline- lean forward to desired stretch (hold 10-15 sec) x 10 reps each.     Education provided throughout session via VC/TC and demonstration to facilitate movement at target joints and correct muscle activation for all testing and exercises performed.     PATIENT EDUCATION: Education details: form/technique with exercise Person educated: Patient Education method: Explanation, Demonstration, Tactile cues, and Verbal cues Education comprehension: verbalized understanding, returned demonstration, verbal cues required, tactile cues required, and needs further education   HOME EXERCISE PROGRAM: No updates today   PT Short Term Goals -       PT SHORT TERM GOAL #1   Title Pt will be  independent with INITIAL  HEP in order to improve strength and balance in order to decrease fall risk and improve function at home and work.    Baseline 04/07/2021= No formal HEP in place. 05/11/2021= Patient reports doing pretty good- compliant with walking program and his stretching/strengthening.    Time 6    Period Weeks  Status Achieved    Target Date 05/19/21      PT SHORT TERM GOAL #2   Title Pt will decrease 5TSTS by at least 3 seconds in order to demonstrate clinically significant improvement in LE strength.    Baseline 04/07/2021= 25 sec; 05/11/2021= 18.65 sec    Time 6    Period Weeks    Status Achieved    Target Date 06/30/21              PT Long Term Goals -       PT LONG TERM GOAL #1   Title Pt will be independent with FINAL HEP in order to improve strength and balance in order to decrease fall risk and improve function at home and work.    Baseline 04/07/2021= No formal HEP in place. 07/01/2021=Patient reports performing walking and some standing LE strengthening exercises as instructed and states no questions at this time.  09/02/21: Pt reports he feels indep with exercises but is not performing HEP as much as he should; 11/17/2021= Patient reports compliant with current HEP- "I do them as best I can."   Time 12    Period Weeks    Status Partially Met    Target Date 09/23/21      PT LONG TERM GOAL #2   Title Pt will improve FOTO to target score of 45% to display perceived improvements in ability to complete ADL's.    Baseline 2/7/22023= 41%; FOTO=53%    Time 12    Period Weeks    Status Achieved    Target Date 06/30/21      PT LONG TERM GOAL #3   Title Pt will decrease 5TSTS by at least 5 seconds in order to demonstrate clinically significant improvement in LE strength.    Baseline 04/07/2021= 25 sec; 05/11/2021= 18.65 sec without UE support; 4/10= 13.76 sec without UE support    Time 12    Period Weeks    Status Achieved    Target Date 06/30/21      PT LONG TERM  GOAL #4   Title Pt will decrease TUG to below 17 seconds/decrease in order to demonstrate decreased fall risk.    Baseline 04/07/2021= 21 sec with SPC; 05/11/2021= 18.85 sec with use of cane; 07/21/2021=18.88 sec  avg with use of cane; 7/5: 22.85 sec with QC; 09/29/2021= 21.45 sec using upright walker; 11/17/2021= 20. 46 sec using upright walker   Time 12    Period Weeks    Status Partially Met    Target Date 12/15/2021     PT LONG TERM GOAL #5   Title Pt will increase 10MWT by at least 0.15 m/s in order to demonstrate clinically significant improvement in community ambulation.    Baseline 04/07/2021= 0.56 m/s using SPC; 05/11/2021= 0.54 m/s using SPC; 07/01/2021= 0.6 m/s/ 7/5: 0.48 m/s without AD but with CGA; 09/29/2021= 0.64 m/s; 11/17/2021= 0.61 m/s using upright 4WW   Time 12    Period Weeks    Status On-going    Target Date 12/15/2021     PT LONG TERM GOAL #6   Title Pt will increase 6MWT by at least 38m (123ft) in order to demonstrate clinically significant improvement in cardiopulmonary endurance and community ambulation    Baseline 06/08/2021= 600 ft. in 5 min- Stopped due to fatigue using 4WW; 07/01/2021= 665 in 5 min 45 sec using 4WW; 7/5: 367 ft with QC; 09/29/2021= 740 feet using upright 4WW; 11/17/2021= 705 feet using upright 4WW   Time  4    Period Weeks    Status On-going    Target Date 12/15/2021             Plan - 08/20/21 0939     Clinical Impression Statement Patient started off slow today but did improve throughout treatment. He was able to increase step length with resistance today and able to progressively step up on increasingly elevated surfaces without LOB.  Pt will continue to benefit from skilled Physical Therapy to address remaining goals and deficits and improve his mobility while decreasing his risk of falling and improving his quality of life.    Personal Factors and Comorbidities Comorbidity 3+    Comorbidities arthritis, bladder cancer, Trigeminal Neuralgia     Examination-Activity Limitations Caring for Others;Carry;Continence;Lift;Squat;Stairs;Stand    Examination-Participation Restrictions Community Activity;Yard Work    [employer] Evolving/Moderate complexity    Rehab Potential Good    PT Frequency 2x / week    PT Duration 12 weeks    PT Treatment/Interventions ADLs/Self Care Home Management;Cryotherapy;Canalith Repostioning;Electrical Stimulation;Moist Heat;DME Instruction;Gait training;Stair training;Functional mobility training;Therapeutic activities;Therapeutic exercise;Balance training;Neuromuscular re-education;Patient/family education;Manual techniques;Passive range of motion;Dry needling;Vestibular    PT Next Visit Plan LE strength, improve step length with ambulation, muscle tissue lengthening    PT Home Exercise Plan Access Code: [place] Laraine Samet, PT Physical Therapist- [facility]  12/01/21, 1:16 PM

## 2021-12-01 ENCOUNTER — Ambulatory Visit: Payer: Medicare HMO | Attending: Neurology

## 2021-12-01 DIAGNOSIS — R269 Unspecified abnormalities of gait and mobility: Secondary | ICD-10-CM | POA: Insufficient documentation

## 2021-12-01 DIAGNOSIS — R2689 Other abnormalities of gait and mobility: Secondary | ICD-10-CM | POA: Insufficient documentation

## 2021-12-01 DIAGNOSIS — R2681 Unsteadiness on feet: Secondary | ICD-10-CM | POA: Diagnosis present

## 2021-12-01 DIAGNOSIS — M545 Low back pain, unspecified: Secondary | ICD-10-CM | POA: Diagnosis present

## 2021-12-01 DIAGNOSIS — M6281 Muscle weakness (generalized): Secondary | ICD-10-CM | POA: Diagnosis present

## 2021-12-01 DIAGNOSIS — R278 Other lack of coordination: Secondary | ICD-10-CM | POA: Diagnosis present

## 2021-12-01 DIAGNOSIS — G8929 Other chronic pain: Secondary | ICD-10-CM | POA: Diagnosis present

## 2021-12-01 DIAGNOSIS — R262 Difficulty in walking, not elsewhere classified: Secondary | ICD-10-CM | POA: Diagnosis present

## 2021-12-03 ENCOUNTER — Ambulatory Visit: Payer: Medicare HMO

## 2021-12-03 DIAGNOSIS — R278 Other lack of coordination: Secondary | ICD-10-CM

## 2021-12-03 DIAGNOSIS — R269 Unspecified abnormalities of gait and mobility: Secondary | ICD-10-CM

## 2021-12-03 DIAGNOSIS — G8929 Other chronic pain: Secondary | ICD-10-CM

## 2021-12-03 DIAGNOSIS — M6281 Muscle weakness (generalized): Secondary | ICD-10-CM

## 2021-12-03 DIAGNOSIS — R2689 Other abnormalities of gait and mobility: Secondary | ICD-10-CM

## 2021-12-03 DIAGNOSIS — R262 Difficulty in walking, not elsewhere classified: Secondary | ICD-10-CM | POA: Diagnosis not present

## 2021-12-03 DIAGNOSIS — R2681 Unsteadiness on feet: Secondary | ICD-10-CM

## 2021-12-03 NOTE — Therapy (Signed)
OUTPATIENT PHYSICAL THERAPY TREATMENT NOTE      Patient Name: [name] MRN: [id number] DOB:Sep 11, 1945, 76 y.o., male Today's Date: 12/03/2021  PCP: Orene Desanctis. Moshe Cipro, FNP REFERRING PROVIDER: Dr. Theora Master   PT End of Session - 12/03/21 0925     Visit Number 65    Number of Visits 70    Date for PT Re-Evaluation 12/15/21    Authorization Type [employer]    Authorization Time Period 07/01/21-09/20/21    Progress Note Due on Visit 70    PT Start Time 0925    PT Stop Time 1010    PT Time Calculation (min) 45 min    Equipment Utilized During Treatment Gait belt    Activity Tolerance Patient tolerated treatment well;No increased pain    Behavior During Therapy [facility] for tasks assessed/performed                     Past Medical History:  Diagnosis Date   Anxiety    Arteriosclerosis of coronary artery 11/16/2011   Overview:  Stent 10/2011 stent rca 2015 with collaterals to lad which is chronically occluded    Benign enlargement of prostate    Benign essential HTN 06/11/2014   Benign prostatic hypertrophy without urinary obstruction 07/31/2014   Bilateral cataracts 05/16/2013   Overview:  [facility]     Bone spur of foot    Left   BP (high blood pressure) 11/09/2012   Cancer (HCC)    skin (forehead) and bladder   Carotid artery narrowing 02/08/2014   Depression    Detrusor hypertrophy    Diabetes (HCC)    Diabetes mellitus, type 2 (HCC) 12/12/2012   Diverticulosis    Dyspnea    Esophageal reflux    Esophageal reflux    Fothergill's neuralgia 08/07/2012   Overview:  [facility]    Gastritis    GERD (gastroesophageal reflux disease)    Headache    cluster headaches   Healed myocardial infarct 11/09/2012   Hearing loss in left ear    Heart disease    Hematuria    Hemorrhoids    History of hiatal hernia 12/14/2017   small    Hypercholesteremia    Lesion of bladder    Myocardial infarct (HCC)    Presence of stent in coronary artery  11/09/2012   Rectal bleeding    Trigeminal neuralgia    Trigeminal neuralgia    Valvular heart disease    Vitamin D deficiency    Past Surgical History:  Procedure Laterality Date   APPENDECTOMY     BOTOX INJECTION N/A 12/21/2017   Procedure: Bladder BOTOX INJECTION;  Surgeon: Vanna Scotland, MD;  Location: [facility] ORS;  Service: Urology;  Laterality: N/A;   BOTOX INJECTION N/A 09/11/2018   Procedure: Bladder BOTOX INJECTION;  Surgeon: Vanna Scotland, MD;  Location: [facility] ORS;  Service: Urology;  Laterality: N/A;   CARDIAC CATHETERIZATION     CARDIAC CATHETERIZATION N/A 01/22/2015   Procedure: Left Heart Cath;  Surgeon: Lamar Blinks, MD;  Location: [facility] INVASIVE CV LAB;  Service: Cardiovascular;  Laterality: N/A;   CARDIAC CATHETERIZATION N/A 01/22/2015   Procedure: Coronary Stent Intervention;  Surgeon: Marcina Millard, MD;  Location: [facility] INVASIVE CV LAB;  Service: Cardiovascular;  Laterality: N/A;   CATARACT EXTRACTION, BILATERAL     COLONOSCOPY WITH PROPOFOL N/A 12/08/2015   Procedure: COLONOSCOPY WITH PROPOFOL;  Surgeon: Christena Deem, MD;  Location: [facility] ENDOSCOPY;  Service: Endoscopy;  Laterality: N/A;  COLONOSCOPY WITH PROPOFOL N/A 12/09/2015   Procedure: COLONOSCOPY WITH PROPOFOL;  Surgeon: Lollie Sails, MD;  Location: [facility] ENDOSCOPY;  Service: Endoscopy;  Laterality: N/A;   CORONARY ANGIOPLASTY     5 stents   CORONARY STENT PLACEMENT  2015   x5   CYSTOSCOPY N/A 09/11/2018   Procedure: CYSTOSCOPY;  Surgeon: [name], MD;  Location: [facility] ORS;  Service: Urology;  Laterality: N/A;   CYSTOSCOPY WITH BIOPSY N/A 12/21/2017   Procedure: CYSTOSCOPY WITH BIOPSY;  Surgeon: [name], MD;  Location: [facility] ORS;  Service: Urology;  Laterality: N/A;   ESOPHAGOGASTRODUODENOSCOPY (EGD) WITH PROPOFOL N/A 12/08/2015   Procedure: ESOPHAGOGASTRODUODENOSCOPY (EGD) WITH PROPOFOL;  Surgeon: Lollie Sails, MD;  Location: [facility] ENDOSCOPY;  Service: Endoscopy;  Laterality:  N/A;   ESOPHAGOGASTRODUODENOSCOPY (EGD) WITH PROPOFOL N/A 12/13/2017   Procedure: ESOPHAGOGASTRODUODENOSCOPY (EGD) WITH PROPOFOL;  Surgeon: Lollie Sails, MD;  Location: [facility] ENDOSCOPY;  Service: Endoscopy;  Laterality: N/A;   EYE SURGERY     HERNIA REPAIR     kidney tumor remove     TRANSURETHRAL RESECTION OF BLADDER TUMOR WITH GYRUS (TURBT-GYRUS)  85/0277   UMBILICAL HERNIA REPAIR     urethral meatotomy     Patient Active Problem List   Diagnosis Date Noted   Class 2 obesity due to excess calories with body mass index (BMI) of 36.0 to 36.9 in adult 04/11/2020   Swelling of limb 03/28/2020   Lymphedema 03/28/2020   Trigeminal neuralgia 12/25/2019   Lower limb ulcer, calf, left, limited to breakdown of skin ([place]) 12/25/2019   OSA (obstructive sleep apnea) 41/28/7867   Acute systolic CHF (congestive heart failure) ([place]) 12/12/2018   Bruising 07/10/2018   Diet-controlled type 2 diabetes mellitus ([place]) 03/24/2018   Microalbuminuria 03/24/2018   Dizziness 11/17/2017   Simple chronic bronchitis ([place]) 09/22/2017   SOBOE (shortness of breath on exertion) 02/18/2017   Chronic midline low back pain without sciatica 12/29/2016   Periodic limb movement disorder 12/29/2016   Benign essential tremor 06/22/2016   Pure hypercholesterolemia 06/20/2015   Erectile dysfunction due to arterial insufficiency 06/16/2015   Unstable angina ([place]) 01/22/2015   Abdominal aortic 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 rupture ([place]) 12/31/2014   Benign prostatic hypertrophy without urinary obstruction 07/31/2014   Enlarged prostate 07/31/2014   Benign essential HTN 06/11/2014   Carotid artery narrowing 02/08/2014   Carotid artery obstruction 02/08/2014   Bilateral carotid artery stenosis 02/08/2014   Chest pain 08/20/2013   Bilateral cataracts 05/16/2013   Cataract 05/16/2013   Clinical depression 12/12/2012   Diabetes mellitus, type 2 ([place]) 12/12/2012   Combined fat and carbohydrate induced hyperlipemia  12/12/2012   Major depressive disorder, single episode, unspecified 12/12/2012   Acid reflux 11/09/2012   Presence of stent in coronary artery 11/09/2012   BP (high blood pressure) 11/09/2012   Healed myocardial infarct 11/09/2012   Gastro-esophageal reflux disease without esophagitis 11/09/2012   History of cardiovascular surgery 11/09/2012   Fothergill's neuralgia 08/07/2012   Swelling of testicle 04/06/2012   Disorder of male genital organ 04/06/2012   Swelling of the testicles 04/06/2012   Arteriosclerosis of coronary artery 11/16/2011   CAD in native artery 11/16/2011   Fatigue 06/04/2011   Avitaminosis D 11/30/2010    REFERRING DIAG: Repeated falls   THERAPY DIAG:   Difficulty in walking, not elsewhere classified  Muscle weakness (generalized)  Abnormality of gait and mobility  Other lack of coordination  Other abnormalities of gait and mobility  Unsteadiness on feet  Chronic bilateral low back  pain without sciatica  Rationale for Evaluation and Treatment Rehabilitation   Subjective Assessment - 09/22/21 0936     Subjective  Patient reports feeling well today without any major changes- States stiff and slow at times.    Patient is accompained by: Family member -Wife   Pertinent History Patient is a 76 year old male with referral for Physical Therapy with diagnosis of Parkinsons disease and history of falling. He reports he diagnosed last year with Parkinsons and has improved his tremors with use of medication but reports worsening shuffling and difficulty with mobility. Patient has past medical history signifiant for Parkinsons; Arthritis, bladder cancer, Trigemic Neuralgia. He lives with wife in 1 level home and current using cane with reported recent fall.    Limitations Walking;Lifting;Standing;House hold activities    How long can you sit comfortably? No limits    How long can you stand comfortably? 15 min- limited due to back pain and foot numbess    How long  can you walk comfortably? about 80 feet    Patient Stated Goals Improve my balance, be able to get up off the floor so I can maybe work in my garage again.    Currently in Pain? No/denies               PRECAUTIONS: Falls   PAIN:  No pain reported.     TODAY'S TREATMENT: 12/03/2021    Manual Therapy: (moist heat to low back while performing stretches)   SKC - Hold 30 sec x 3 each LE Hamstring stretch - hold 30 sec x 3 each LE Lower trunk rotation - Hold 30 sec x 3 each LE  Therapeutic Activities:   Bed mobility/flexibility exercises- Supine posture stretch - Press heels and shoulders into bed- hold 5 sec x 8 reps. Upper body- weight shift with arm extended- lifting one arm and same side leg up and rotate toward opp side.  Repeat on opp side x 15 reps Supine- into trunk twist (Thoracic rotation) left to right and right to left - x 15 reps each (increased time to complete)  Bed mobility with knees flexed- start off on right side of bed- abd left leg then add right leg, perform bridge and shift hips to left, then maneuver shoulders to left- Repeat process until reach opp end of bed - repeat x 3 times each direction. (Patient very fatigued yet no pain)  Positioned into quadruped- with only VC and CGA. Patient then performed shift backward (bottom towards heels- hold 10 sec- then press up into tall kneeling position x 10 reps ( Patient with difficulty extending hips intially- did improve with VC, Visual demo, and practice) .   Ambulation approx 180 feet using upright 4WW- Cues only for step length- gait speed measured at 0.72 m/s today.    Education provided throughout session via VC/TC and demonstration to facilitate movement at target joints and correct muscle activation for all testing and exercises performed.     PATIENT EDUCATION: Education details: form/technique with exercise Person educated: Patient Education method: Explanation, Demonstration, Tactile cues, and  Verbal cues Education comprehension: verbalized understanding, returned demonstration, verbal cues required, tactile cues required, and needs further education   HOME EXERCISE PROGRAM: No updates today   PT Short Term Goals -       PT SHORT TERM GOAL #1   Title Pt will be independent with INITIAL  HEP in order to improve strength and balance in order to decrease fall risk and improve function at  home and work.    Baseline 04/07/2021= No formal HEP in place. 05/11/2021= Patient reports doing pretty good- compliant with walking program and his stretching/strengthening.    Time 6    Period Weeks    Status Achieved    Target Date 05/19/21      PT SHORT TERM GOAL #2   Title Pt will decrease 5TSTS by at least 3 seconds in order to demonstrate clinically significant improvement in LE strength.    Baseline 04/07/2021= 25 sec; 05/11/2021= 18.65 sec    Time 6    Period Weeks    Status Achieved    Target Date 06/30/21              PT Long Term Goals -       PT LONG TERM GOAL #1   Title Pt will be independent with FINAL HEP in order to improve strength and balance in order to decrease fall risk and improve function at home and work.    Baseline 04/07/2021= No formal HEP in place. 07/01/2021=Patient reports performing walking and some standing LE strengthening exercises as instructed and states no questions at this time.  09/02/21: Pt reports he feels indep with exercises but is not performing HEP as much as he should; 11/17/2021= Patient reports compliant with current HEP- "I do them as best I can."   Time 12    Period Weeks    Status Partially Met    Target Date 09/23/21      PT LONG TERM GOAL #2   Title Pt will improve FOTO to target score of 45% to display perceived improvements in ability to complete ADL's.    Baseline 2/7/22023= 41%; FOTO=53%    Time 12    Period Weeks    Status Achieved    Target Date 06/30/21      PT LONG TERM GOAL #3   Title Pt will decrease 5TSTS by at least 5  seconds in order to demonstrate clinically significant improvement in LE strength.    Baseline 04/07/2021= 25 sec; 05/11/2021= 18.65 sec without UE support; 4/10= 13.76 sec without UE support    Time 12    Period Weeks    Status Achieved    Target Date 06/30/21      PT LONG TERM GOAL #4   Title Pt will decrease TUG to below 17 seconds/decrease in order to demonstrate decreased fall risk.    Baseline 04/07/2021= 21 sec with SPC; 05/11/2021= 18.85 sec with use of cane; 07/21/2021=18.88 sec  avg with use of cane; 7/5: 22.85 sec with QC; 09/29/2021= 21.45 sec using upright walker; 11/17/2021= 20. 46 sec using upright walker   Time 12    Period Weeks    Status Partially Met    Target Date 12/15/2021     PT LONG TERM GOAL #5   Title Pt will increase 10MWT by at least 0.15 m/s in order to demonstrate clinically significant improvement in community ambulation.    Baseline 04/07/2021= 0.56 m/s using SPC; 05/11/2021= 0.54 m/s using SPC; 07/01/2021= 0.6 m/s/ 7/5: 0.48 m/s without AD but with CGA; 09/29/2021= 0.64 m/s; 11/17/2021= 0.61 m/s using upright 4WW   Time 12    Period Weeks    Status On-going    Target Date 12/15/2021     PT LONG TERM GOAL #6   Title Pt will increase 6MWT by at least 32m (144ft) in order to demonstrate clinically significant improvement in cardiopulmonary endurance and community ambulation    Baseline 06/08/2021= 600  ft. in 5 min- Stopped due to fatigue using 4WW; 07/01/2021= 665 in 5 min 45 sec using 4WW; 7/5: 367 ft with QC; 09/29/2021= 740 feet using upright 4WW; 11/17/2021= 705 feet using upright 4WW   Time 4    Period Weeks    Status On-going    Target Date 12/15/2021             Plan - 08/20/21 0939     Clinical Impression Statement Patient presented with good motivation for today's session and no report of pain. He was very tight particularly with hamstrings and lumbar rotation. He did improve with increased stretching. He also demonstrated progress with bed mobility/flexibility  training today- able to roll and weight shift better with practice. He also progressed to quadruped and tall kneeling well with some effort. He was abel to demo improved overall gait quality- more erect posture and improved step length with gait speed=  0.72 m/s today.   Pt will continue to benefit from skilled Physical Therapy to address remaining goals and deficits and improve his mobility while decreasing his risk of falling and improving his quality of life.    Personal Factors and Comorbidities Comorbidity 3+    Comorbidities arthritis, bladder cancer, Trigeminal Neuralgia    Examination-Activity Limitations Caring for Others;Carry;Continence;Lift;Squat;Stairs;Stand    Examination-Participation Restrictions Community Activity;Yard Work    [employer] Evolving/Moderate complexity    Rehab Potential Good    PT Frequency 2x / week    PT Duration 12 weeks    PT Treatment/Interventions ADLs/Self Care Home Management;Cryotherapy;Canalith Repostioning;Electrical Stimulation;Moist Heat;DME Instruction;Gait training;Stair training;Functional mobility training;Therapeutic activities;Therapeutic exercise;Balance training;Neuromuscular re-education;Patient/family education;Manual techniques;Passive range of motion;Dry needling;Vestibular    PT Next Visit Plan LE strength, improve step length with ambulation, muscle tissue lengthening    PT Home Exercise Plan Access Code: [place] Virna Livengood, PT Physical Therapist- [facility]  12/03/21, 10:35 AM

## 2021-12-08 ENCOUNTER — Ambulatory Visit: Payer: Medicare HMO

## 2021-12-10 ENCOUNTER — Ambulatory Visit: Payer: Medicare HMO

## 2021-12-15 ENCOUNTER — Ambulatory Visit: Payer: Medicare HMO

## 2021-12-15 DIAGNOSIS — R278 Other lack of coordination: Secondary | ICD-10-CM

## 2021-12-15 DIAGNOSIS — M545 Low back pain, unspecified: Secondary | ICD-10-CM

## 2021-12-15 DIAGNOSIS — R2681 Unsteadiness on feet: Secondary | ICD-10-CM

## 2021-12-15 DIAGNOSIS — M6281 Muscle weakness (generalized): Secondary | ICD-10-CM

## 2021-12-15 DIAGNOSIS — R262 Difficulty in walking, not elsewhere classified: Secondary | ICD-10-CM | POA: Diagnosis not present

## 2021-12-15 DIAGNOSIS — R269 Unspecified abnormalities of gait and mobility: Secondary | ICD-10-CM

## 2021-12-15 DIAGNOSIS — R2689 Other abnormalities of gait and mobility: Secondary | ICD-10-CM

## 2021-12-15 NOTE — Therapy (Signed)
OUTPATIENT PHYSICAL THERAPY TREATMENT NOTE/RECERTIFICATION      Patient Name: EDIEL UNANGST MRN: [id number] DOB:12/05/1945, 76 y.o., male Today's Date: 12/15/2021  PCP: [name]. [name], FNP REFERRING PROVIDER: Dr. Gurney Maxin   PT End of Session - 12/15/21 0934     Visit Number 51    Number of Visits 76    Date for PT Re-Evaluation 03/09/22    Authorization Type [employer]    Authorization Time Period 04/09/90-06/24/81; recert 41/96/2229- 09/07/8919    Progress Note Due on Visit 70    PT Start Time 0925    PT Stop Time 1013    PT Time Calculation (min) 48 min    Equipment Utilized During Treatment Gait belt    Activity Tolerance Patient tolerated treatment well;No increased pain    Behavior During Therapy [facility] for tasks assessed/performed                     Past Medical History:  Diagnosis Date   Anxiety    Arteriosclerosis of coronary artery 11/16/2011   Overview:  Stent 10/2011 stent rca 2015 with collaterals to lad which is chronically occluded    Benign enlargement of prostate    Benign essential HTN 06/11/2014   Benign prostatic hypertrophy without urinary obstruction 07/31/2014   Bilateral cataracts 05/16/2013   Overview:  Dr. [facility]     Bone spur of foot    Left   BP (high blood pressure) 11/09/2012   Cancer ([place])    skin (forehead) and bladder   Carotid artery narrowing 02/08/2014   Depression    Detrusor hypertrophy    Diabetes ([place])    Diabetes mellitus, type 2 ([place]) 12/12/2012   Diverticulosis    Dyspnea    Esophageal reflux    Esophageal reflux    Fothergill's neuralgia 08/07/2012   Overview:  [facility]    Gastritis    GERD (gastroesophageal reflux disease)    Headache    cluster headaches   Healed myocardial infarct 11/09/2012   Hearing loss in left ear    Heart disease    Hematuria    Hemorrhoids    History of hiatal hernia 12/14/2017   small    Hypercholesteremia    Lesion of bladder    Myocardial infarct (HCC)     Presence of stent in coronary artery 11/09/2012   Rectal bleeding    Trigeminal neuralgia    Trigeminal neuralgia    Valvular heart disease    Vitamin D deficiency    Past Surgical History:  Procedure Laterality Date   APPENDECTOMY     BOTOX INJECTION N/A 12/21/2017   Procedure: Bladder BOTOX INJECTION;  Surgeon: [name], MD;  Location: [facility] ORS;  Service: Urology;  Laterality: N/A;   BOTOX INJECTION N/A 09/11/2018   Procedure: Bladder BOTOX INJECTION;  Surgeon: [name], MD;  Location: [facility] ORS;  Service: Urology;  Laterality: N/A;   CARDIAC CATHETERIZATION     CARDIAC CATHETERIZATION N/A 01/22/2015   Procedure: Left Heart Cath;  Surgeon: Corey Skains, MD;  Location: [place] [facility];  Service: Cardiovascular;  Laterality: N/A;   CARDIAC CATHETERIZATION N/A 01/22/2015   Procedure: Coronary Stent Intervention;  Surgeon: Isaias Cowman, MD;  Location: [place] [facility];  Service: Cardiovascular;  Laterality: N/A;   CATARACT EXTRACTION, BILATERAL     COLONOSCOPY WITH PROPOFOL N/A 12/08/2015   Procedure: COLONOSCOPY WITH PROPOFOL;  Surgeon: Lollie Sails, MD;  Location: [facility] ENDOSCOPY;  Service: Endoscopy;  Laterality: N/A;   COLONOSCOPY WITH PROPOFOL N/A 12/09/2015   Procedure: COLONOSCOPY WITH PROPOFOL;  Surgeon: Lollie Sails, MD;  Location: [facility]COPY;  Service: Endoscopy;  Laterality: N/A;   CORONARY ANGIOPLASTY     5 stents   CORONARY STENT PLACEMENT  2015   x5   CYSTOSCOPY N/A 09/11/2018   Procedure: CYSTOSCOPY;  Surgeon: [name], MD;  Location: [facility] ORS;  Service: Urology;  Laterality: N/A;   CYSTOSCOPY WITH BIOPSY N/A 12/21/2017   Procedure: CYSTOSCOPY WITH BIOPSY;  Surgeon: [name], MD;  Location: [facility] ORS;  Service: Urology;  Laterality: N/A;   ESOPHAGOGASTRODUODENOSCOPY (EGD) WITH PROPOFOL N/A 12/08/2015   Procedure: ESOPHAGOGASTRODUODENOSCOPY (EGD) WITH PROPOFOL;  Surgeon: Lollie Sails, MD;  Location: [facility]  ENDOSCOPY;  Service: Endoscopy;  Laterality: N/A;   ESOPHAGOGASTRODUODENOSCOPY (EGD) WITH PROPOFOL N/A 12/13/2017   Procedure: ESOPHAGOGASTRODUODENOSCOPY (EGD) WITH PROPOFOL;  Surgeon: Lollie Sails, MD;  Location: [facility] ENDOSCOPY;  Service: Endoscopy;  Laterality: N/A;   EYE SURGERY     HERNIA REPAIR     kidney tumor remove     TRANSURETHRAL RESECTION OF BLADDER TUMOR WITH GYRUS (TURBT-GYRUS)  46/6599   UMBILICAL HERNIA REPAIR     urethral meatotomy     Patient Active Problem List   Diagnosis Date Noted   Class 2 obesity due to excess calories with body mass index (BMI) of 36.0 to 36.9 in adult 04/11/2020   Swelling of limb 03/28/2020   Lymphedema 03/28/2020   Trigeminal neuralgia 12/25/2019   Lower limb ulcer, calf, left, limited to breakdown of skin ([place]) 12/25/2019   OSA (obstructive sleep apnea) 35/70/1779   Acute systolic CHF (congestive heart failure) ([place]) 12/12/2018   Bruising 07/10/2018   Diet-controlled type 2 diabetes mellitus ([place]) 03/24/2018   Microalbuminuria 03/24/2018   Dizziness 11/17/2017   Simple chronic bronchitis ([place]) 09/22/2017   SOBOE (shortness of breath on exertion) 02/18/2017   Chronic midline low back pain without sciatica 12/29/2016   Periodic limb movement disorder 12/29/2016   Benign essential tremor 06/22/2016   Pure hypercholesterolemia 06/20/2015   Erectile dysfunction due to arterial insufficiency 06/16/2015   Unstable angina ([place]) 01/22/2015   Abdominal aortic 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 rupture ([place]) 12/31/2014   Benign prostatic hypertrophy without urinary obstruction 07/31/2014   Enlarged prostate 07/31/2014   Benign essential HTN 06/11/2014   Carotid artery narrowing 02/08/2014   Carotid artery obstruction 02/08/2014   Bilateral carotid artery stenosis 02/08/2014   Chest pain 08/20/2013   Bilateral cataracts 05/16/2013   Cataract 05/16/2013   Clinical depression 12/12/2012   Diabetes mellitus, type 2 ([place]) 12/12/2012   Combined fat  and carbohydrate induced hyperlipemia 12/12/2012   Major depressive disorder, single episode, unspecified 12/12/2012   Acid reflux 11/09/2012   Presence of stent in coronary artery 11/09/2012   BP (high blood pressure) 11/09/2012   Healed myocardial infarct 11/09/2012   Gastro-esophageal reflux disease without esophagitis 11/09/2012   History of cardiovascular surgery 11/09/2012   Fothergill's neuralgia 08/07/2012   Swelling of testicle 04/06/2012   Disorder of male genital organ 04/06/2012   Swelling of the testicles 04/06/2012   Arteriosclerosis of coronary artery 11/16/2011   CAD in native artery 11/16/2011   Fatigue 06/04/2011   Avitaminosis D 11/30/2010    REFERRING DIAG: Repeated falls   THERAPY DIAG:   Difficulty in walking, not elsewhere classified  Muscle weakness (generalized)  Other lack of coordination  Abnormality of gait and mobility  Other abnormalities of gait and mobility  Unsteadiness on feet  Chronic bilateral low back pain without sciatica  Rationale for Evaluation and Treatment Rehabilitation   Subjective Assessment - 09/22/21 0936     Subjective  Patient reports having a rough week with increased overall weakness and more difficulty with walking. Denies any falls but reports moving very slowly.    Patient is accompained by: Family member -Wife   Pertinent History Patient is a 76 year old male with referral for Physical Therapy with diagnosis of Parkinsons disease and history of falling. He reports he diagnosed last year with Parkinsons and has improved his tremors with use of medication but reports worsening shuffling and difficulty with mobility. Patient has past medical history signifiant for Parkinsons; Arthritis, bladder cancer, Trigemic Neuralgia. He lives with wife in 1 level home and current using cane with reported recent fall.    Limitations Walking;Lifting;Standing;House hold activities    How long can you sit comfortably? No limits    How  long can you stand comfortably? 15 min- limited due to back pain and foot numbess    How long can you walk comfortably? about 80 feet    Patient Stated Goals Improve my balance, be able to get up off the floor so I can maybe work in my garage again.    Currently in Pain? No/denies               PRECAUTIONS: Falls   PAIN:  No pain reported.     TODAY'S TREATMENT: 12/15/2021   Nustep L1 for BUE/LE for improved overall strength, coordination of movement, and mobility x 8 min.   Reassessed goals for today's recertification visit- See goal section for today's details.     Education provided throughout session via VC/TC and demonstration to facilitate movement at target joints and correct muscle activation for all testing and exercises performed.     PATIENT EDUCATION: Education details: form/technique with exercise Person educated: Patient Education method: Explanation, Demonstration, Tactile cues, and Verbal cues Education comprehension: verbalized understanding, returned demonstration, verbal cues required, tactile cues required, and needs further education   HOME EXERCISE PROGRAM: No updates today   PT Short Term Goals -       PT SHORT TERM GOAL #1   Title Pt will be independent with INITIAL  HEP in order to improve strength and balance in order to decrease fall risk and improve function at home and work.    Baseline 04/07/2021= No formal HEP in place. 05/11/2021= Patient reports doing pretty good- compliant with walking program and his stretching/strengthening.    Time 6    Period Weeks    Status Achieved    Target Date 05/19/21      PT SHORT TERM GOAL #2   Title Pt will decrease 5TSTS by at least 3 seconds in order to demonstrate clinically significant improvement in LE strength.    Baseline 04/07/2021= 25 sec; 05/11/2021= 18.65 sec    Time 6    Period Weeks    Status Achieved    Target Date 06/30/21              PT Long Term Goals -       PT LONG TERM  GOAL #1   Title Pt will be independent with FINAL HEP in order to improve strength and balance in order to decrease fall risk and improve function at home and work.    Baseline 04/07/2021= No formal HEP in place. 07/01/2021=Patient reports performing walking and some standing LE strengthening exercises as instructed and states no  questions at this time.  09/02/21: Pt reports he feels indep with exercises but is not performing HEP as much as he should; 11/17/2021= Patient reports compliant with current HEP- "I do them as best I can." 12/15/2021- Patient reports doing some exercises and walking but not feeling well in past week and denied performing HEP. Will keep goal active to ensure compliance and update HEP as appropriate.    Time 12    Period Weeks    Status Partially Met    Target Date 03/09/2022     PT LONG TERM GOAL #2   Title Pt will improve FOTO to target score of 45% to display perceived improvements in ability to complete ADL's.    Baseline 2/7/22023= 41%; FOTO=53%    Time 12    Period Weeks    Status Achieved    Target Date 06/30/21      PT LONG TERM GOAL #3   Title Pt will decrease 5TSTS by at least 5 seconds in order to demonstrate clinically significant improvement in LE strength.    Baseline 04/07/2021= 25 sec; 05/11/2021= 18.65 sec without UE support; 4/10= 13.76 sec without UE support    Time 12    Period Weeks    Status Achieved    Target Date 06/30/21      PT LONG TERM GOAL #4   Title Pt will decrease TUG to below 17 seconds/decrease in order to demonstrate decreased fall risk.    Baseline 04/07/2021= 21 sec with SPC; 05/11/2021= 18.85 sec with use of cane; 07/21/2021=18.88 sec  avg with use of cane; 7/5: 22.85 sec with QC; 09/29/2021= 21.45 sec using upright walker; 11/17/2021= 20. 46 sec using upright walker; 10/17= 24 sec with upright walker   Time 12    Period Weeks    Status Partially Met    Target Date 03/09/2022     PT LONG TERM GOAL #5   Title Pt will increase 10MWT by at least  0.15 m/s in order to demonstrate clinically significant improvement in community ambulation.    Baseline 04/07/2021= 0.56 m/s using SPC; 05/11/2021= 0.54 m/s using SPC; 07/01/2021= 0.6 m/s/ 7/5: 0.48 m/s without AD but with CGA; 09/29/2021= 0.64 m/s; 11/17/2021= 0.61 m/s using upright 4WW; 10/17= 0.59 m/s   Time 12    Period Weeks    Status On-going    Target Date 03/09/2022     PT LONG TERM GOAL #6   Title Pt will increase 6MWT by at least 58m(1649f in order to demonstrate clinically significant improvement in cardiopulmonary endurance and community ambulation    Baseline 06/08/2021= 600 ft. in 5 min- Stopped due to fatigue using 4WW; 07/01/2021= 665 in 5 min 45 sec using 4WW; 7/5: 367 ft with QC; 09/29/2021= 740 feet using upright 4WW; 11/17/2021= 705 feet using upright 4WW; 12/15/2021= 680 feet with upright 4WW   Time 12   Period Weeks    Status On-going    Target Date 03/09/2022             Plan - 08/20/21 0939     Clinical Impression Statement Patient present for recert visit today. Unfortunately today's assess is not a true reflection of his recent progress as he was having a difficult time today - much more than usual. He was having instances of freezing/immobility. He later stated during visit that he has not been taking his parkinsons med as consistent as prescribed. Educated him on importance of taking meds as directed by physician and to contact MD  if he continues to have issues. He did attempt to perform functional outcomes measure/testing today with some decreased values. Will attempt to retest next visit or two if feeling better.   Pt will continue to benefit from skilled Physical Therapy to address remaining goals and deficits and improve his mobility while decreasing his risk of falling and improving his quality of life. Patient's condition has the potential to improve in response to therapy. Maximum improvement is yet to be obtained. The anticipated improvement is attainable and reasonable in  a generally predictable time.     Personal Factors and Comorbidities Comorbidity 3+    Comorbidities arthritis, bladder cancer, Trigeminal Neuralgia    Examination-Activity Limitations Caring for Others;Carry;Continence;Lift;Squat;Stairs;Stand    Examination-Participation Restrictions Community Activity;Yard Work    [employer] Evolving/Moderate complexity    Rehab Potential Good    PT Frequency 2x / week    PT Duration 12 weeks    PT Treatment/Interventions ADLs/Self Care Home Management;Cryotherapy;Canalith Repostioning;Electrical Stimulation;Moist Heat;DME Instruction;Gait training;Stair training;Functional mobility training;Therapeutic activities;Therapeutic exercise;Balance training;Neuromuscular re-education;Patient/family education;Manual techniques;Passive range of motion;Dry needling;Vestibular    PT Next Visit Plan LE strength, improve step length with ambulation, muscle tissue lengthening    PT Home Exercise Plan Access Code: Twain Lacreasha Hinds, PT Physical Therapist- [facility]  12/15/21, 3:11 PM

## 2021-12-17 ENCOUNTER — Ambulatory Visit: Payer: Medicare HMO

## 2021-12-17 DIAGNOSIS — R2689 Other abnormalities of gait and mobility: Secondary | ICD-10-CM

## 2021-12-17 DIAGNOSIS — R278 Other lack of coordination: Secondary | ICD-10-CM

## 2021-12-17 DIAGNOSIS — R269 Unspecified abnormalities of gait and mobility: Secondary | ICD-10-CM

## 2021-12-17 DIAGNOSIS — M6281 Muscle weakness (generalized): Secondary | ICD-10-CM

## 2021-12-17 DIAGNOSIS — R262 Difficulty in walking, not elsewhere classified: Secondary | ICD-10-CM | POA: Diagnosis not present

## 2021-12-17 DIAGNOSIS — R2681 Unsteadiness on feet: Secondary | ICD-10-CM

## 2021-12-17 DIAGNOSIS — G8929 Other chronic pain: Secondary | ICD-10-CM

## 2021-12-17 NOTE — Therapy (Signed)
OUTPATIENT PHYSICAL THERAPY TREATMENT NOTE      Patient Name: [name] MRN: [id number] DOB:[birth year], 76 y.o., male Today's Date: 12/17/2021  PCP: [name]. [name], FNP REFERRING PROVIDER: Dr. Gurney Maxin   PT End of Session - 12/17/21 1021     Visit Number 9    Number of Visits 47    Date for PT Re-Evaluation 03/09/22    Authorization Type [employer]    Authorization Time Period 4/0/98-03/19/12; recert 78/29/5621- 3/0/8657    Progress Note Due on Visit 70    PT Start Time 0935    PT Stop Time 1015    PT Time Calculation (min) 40 min    Equipment Utilized During Treatment Gait belt    Activity Tolerance Patient tolerated treatment well;No increased pain    Behavior During Therapy [facility] for tasks assessed/performed                      Past Medical History:  Diagnosis Date   Anxiety    Arteriosclerosis of coronary artery 11/16/2011   Overview:  Stent 10/2011 stent rca 2015 with collaterals to lad which is chronically occluded    Benign enlargement of prostate    Benign essential HTN 06/11/2014   Benign prostatic hypertrophy without urinary obstruction 07/31/2014   Bilateral cataracts 05/16/2013   Overview:  Dr. [facility]     Bone spur of foot    Left   BP (high blood pressure) 11/09/2012   Cancer ([place])    skin (forehead) and bladder   Carotid artery narrowing 02/08/2014   Depression    Detrusor hypertrophy    Diabetes ([place])    Diabetes mellitus, type 2 ([place]) 12/12/2012   Diverticulosis    Dyspnea    Esophageal reflux    Esophageal reflux    Fothergill's neuralgia 08/07/2012   Overview:  [facility]    Gastritis    GERD (gastroesophageal reflux disease)    Headache    cluster headaches   Healed myocardial infarct 11/09/2012   Hearing loss in left ear    Heart disease    Hematuria    Hemorrhoids    History of hiatal hernia 12/14/2017   small    Hypercholesteremia    Lesion of bladder    Myocardial infarct (HCC)    Presence  of stent in coronary artery 11/09/2012   Rectal bleeding    Trigeminal neuralgia    Trigeminal neuralgia    Valvular heart disease    Vitamin D deficiency    Past Surgical History:  Procedure Laterality Date   APPENDECTOMY     BOTOX INJECTION N/A 12/21/2017   Procedure: Bladder BOTOX INJECTION;  Surgeon: [name], MD;  Location: [facility] ORS;  Service: Urology;  Laterality: N/A;   BOTOX INJECTION N/A 09/11/2018   Procedure: Bladder BOTOX INJECTION;  Surgeon: [name], MD;  Location: [facility] ORS;  Service: Urology;  Laterality: N/A;   CARDIAC CATHETERIZATION     CARDIAC CATHETERIZATION N/A 01/22/2015   Procedure: Left Heart Cath;  Surgeon: Corey Skains, MD;  Location: [place] CV [facility];  Service: Cardiovascular;  Laterality: N/A;   CARDIAC CATHETERIZATION N/A 01/22/2015   Procedure: Coronary Stent Intervention;  Surgeon: Isaias Cowman, MD;  Location: [place] [facility];  Service: Cardiovascular;  Laterality: N/A;   CATARACT EXTRACTION, BILATERAL     COLONOSCOPY WITH PROPOFOL N/A 12/08/2015   Procedure: COLONOSCOPY WITH PROPOFOL;  Surgeon: Lollie Sails, MD;  Location: [facility] ENDOSCOPY;  Service: Endoscopy;  Laterality: N/A;   COLONOSCOPY WITH PROPOFOL N/A 12/09/2015   Procedure: COLONOSCOPY WITH PROPOFOL;  Surgeon: Lollie Sails, MD;  Location: [facility] ENDOSCOPY;  Service: Endoscopy;  Laterality: N/A;   CORONARY ANGIOPLASTY     5 stents   CORONARY STENT PLACEMENT  2015   x5   CYSTOSCOPY N/A 09/11/2018   Procedure: CYSTOSCOPY;  Surgeon: [name], MD;  Location: [facility] ORS;  Service: Urology;  Laterality: N/A;   CYSTOSCOPY WITH BIOPSY N/A 12/21/2017   Procedure: CYSTOSCOPY WITH BIOPSY;  Surgeon: [name], MD;  Location: [facility] OR[facility];  Service: Urology;  Laterality: N/A;   ESOPHAGOGASTRODUODENOSCOPY (EGD) WITH PROPOFOL N/A 12/08/2015   Procedure: ESOPHAGOGASTRODUODENOSCOPY (EGD) WITH PROPOFOL;  Surgeon: Lollie Sails, MD;  Location: [facility]COPY;   Service: Endoscopy;  Laterality: N/A;   ESOPHAGOGASTRODUODENOSCOPY (EGD) WITH PROPOFOL N/A 12/13/2017   Procedure: ESOPHAGOGASTRODUODENOSCOPY (EGD) WITH PROPOFOL;  Surgeon: Lollie Sails, MD;  Location: [facility] ENDOSCOPY;  Service: Endoscopy;  Laterality: N/A;   EYE SURGERY     HERNIA REPAIR     kidney tumor remove     TRANSURETHRAL RESECTION OF BLADDER TUMOR WITH GYRUS (TURBT-GYRUS)  04/7251   UMBILICAL HERNIA REPAIR     urethral meatotomy     Patient Active Problem List   Diagnosis Date Noted   Class 2 obesity due to excess calories with body mass index (BMI) of 36.0 to 36.9 in adult 04/11/2020   Swelling of limb 03/28/2020   Lymphedema 03/28/2020   Trigeminal neuralgia 12/25/2019   Lower limb ulcer, calf, left, limited to breakdown of skin ([place]) 12/25/2019   OSA (obstructive sleep apnea) 66/44/0347   Acute systolic CHF (congestive heart failure) ([place]) 12/12/2018   Bruising 07/10/2018   Diet-controlled type 2 diabetes mellitus ([place]) 03/24/2018   Microalbuminuria 03/24/2018   Dizziness 11/17/2017   Simple chronic bronchitis ([place]) 09/22/2017   SOBOE (shortness of breath on exertion) 02/18/2017   Chronic midline low back pain without sciatica 12/29/2016   Periodic limb movement disorder 12/29/2016   Benign essential tremor 06/22/2016   Pure hypercholesterolemia 06/20/2015   Erectile dysfunction due to arterial insufficiency 06/16/2015   Unstable angina ([place]) 01/22/2015   Abdominal aortic 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 rupture ([place]) 12/31/2014   Benign prostatic hypertrophy without urinary obstruction 07/31/2014   Enlarged prostate 07/31/2014   Benign essential HTN 06/11/2014   Carotid artery narrowing 02/08/2014   Carotid artery obstruction 02/08/2014   Bilateral carotid artery stenosis 02/08/2014   Chest pain 08/20/2013   Bilateral cataracts 05/16/2013   Cataract 05/16/2013   Clinical depression 12/12/2012   Diabetes mellitus, type 2 ([place]) 12/12/2012   Combined fat and  carbohydrate induced hyperlipemia 12/12/2012   Major depressive disorder, single episode, unspecified 12/12/2012   Acid reflux 11/09/2012   Presence of stent in coronary artery 11/09/2012   BP (high blood pressure) 11/09/2012   Healed myocardial infarct 11/09/2012   Gastro-esophageal reflux disease without esophagitis 11/09/2012   History of cardiovascular surgery 11/09/2012   Fothergill's neuralgia 08/07/2012   Swelling of testicle 04/06/2012   Disorder of male genital organ 04/06/2012   Swelling of the testicles 04/06/2012   Arteriosclerosis of coronary artery 11/16/2011   CAD in native artery 11/16/2011   Fatigue 06/04/2011   Avitaminosis D 11/30/2010    REFERRING DIAG: Repeated falls   THERAPY DIAG:   Difficulty in walking, not elsewhere classified  Muscle weakness (generalized)  Other lack of coordination  Abnormality of gait and mobility  Other abnormalities of gait and mobility  Unsteadiness on feet  Chronic bilateral low back pain without sciatica  Rationale for Evaluation and Treatment Rehabilitation   Subjective Assessment - 09/22/21 0936     Subjective  Patient reports continuing to have difficulty with mobility with continued freezing. He stated they were running behind and didn't take his meds this morning.    Patient is accompained by: Family member -Wife   Pertinent History Patient is a 76 year old male with referral for Physical Therapy with diagnosis of Parkinsons disease and history of falling. He reports he diagnosed last year with Parkinsons and has improved his tremors with use of medication but reports worsening shuffling and difficulty with mobility. Patient has past medical history signifiant for Parkinsons; Arthritis, bladder cancer, Trigemic Neuralgia. He lives with wife in 1 level home and current using cane with reported recent fall.    Limitations Walking;Lifting;Standing;House hold activities    How long can you sit comfortably? No limits     How long can you stand comfortably? 15 min- limited due to back pain and foot numbess    How long can you walk comfortably? about 80 feet    Patient Stated Goals Improve my balance, be able to get up off the floor so I can maybe work in my garage again.    Currently in Pain? No/denies               PRECAUTIONS: Falls   PAIN:  No pain reported.     TODAY'S TREATMENT: 12/15/2021   Nustep L1 for BUE/LE for improved overall strength, coordination of movement, and mobility x 6 min. VC to keep SPM > 50   Upon entering clinic- patient struggling with walking with several observed episodes of freezing.  Issued handout from [facility] regarding Freezing- Reviewed and discussed with patient and wife - focused some of treatment while walking with upright 4WW on the 4s's-  Stop- Had patient stop several times and regroup. He was able to stop upon command but required cueing to remember to stop Sigh-VC to take an audible breath- Patient able to verbalize good understanding. Shift- VC and TC to lateral/A/P weight shift Step- VC to try to take a larger step  Gait in // bars for approx 19 min total with several short standing rest breaks- Patient utilized both parallel bars and was able to walk forward for several min and turn 180 deg in //bars and continue (approx 7 steps from one end of bar to the other)  Sit to stand x 10 reps without UE support Seated lumbar flex (holding onto soft red ball) into overhead raise for thoracic ext (to focus on posture) x 20 reps.  Ambuation at end of visit > 200 feet with upright 4WW and CGA- minimal VC for reciprocal step but much improved at end of session and no freezing through doorway thresholds and elevator.    Education provided throughout session via VC/TC and demonstration to facilitate movement at target joints and correct muscle activation for all testing and exercises performed.     PATIENT EDUCATION: Education details:  form/technique with exercise Person educated: Patient Education method: Explanation, Demonstration, Tactile cues, and Verbal cues Education comprehension: verbalized understanding, returned demonstration, verbal cues required, tactile cues required, and needs further education   HOME EXERCISE PROGRAM: No updates today   PT Short Term Goals -       PT SHORT TERM GOAL #1   Title Pt will be independent with INITIAL  HEP in order to improve strength and balance in order to  decrease fall risk and improve function at home and work.    Baseline 04/07/2021= No formal HEP in place. 05/11/2021= Patient reports doing pretty good- compliant with walking program and his stretching/strengthening.    Time 6    Period Weeks    Status Achieved    Target Date 05/19/21      PT SHORT TERM GOAL #2   Title Pt will decrease 5TSTS by at least 3 seconds in order to demonstrate clinically significant improvement in LE strength.    Baseline 04/07/2021= 25 sec; 05/11/2021= 18.65 sec    Time 6    Period Weeks    Status Achieved    Target Date 06/30/21              PT Long Term Goals -       PT LONG TERM GOAL #1   Title Pt will be independent with FINAL HEP in order to improve strength and balance in order to decrease fall risk and improve function at home and work.    Baseline 04/07/2021= No formal HEP in place. 07/01/2021=Patient reports performing walking and some standing LE strengthening exercises as instructed and states no questions at this time.  09/02/21: Pt reports he feels indep with exercises but is not performing HEP as much as he should; 11/17/2021= Patient reports compliant with current HEP- "I do them as best I can." 12/15/2021- Patient reports doing some exercises and walking but not feeling well in past week and denied performing HEP. Will keep goal active to ensure compliance and update HEP as appropriate.    Time 12    Period Weeks    Status Partially Met    Target Date 03/09/2022     PT LONG  TERM GOAL #2   Title Pt will improve FOTO to target score of 45% to display perceived improvements in ability to complete ADL's.    Baseline 2/7/22023= 41%; FOTO=53%    Time 12    Period Weeks    Status Achieved    Target Date 06/30/21      PT LONG TERM GOAL #3   Title Pt will decrease 5TSTS by at least 5 seconds in order to demonstrate clinically significant improvement in LE strength.    Baseline 04/07/2021= 25 sec; 05/11/2021= 18.65 sec without UE support; 4/10= 13.76 sec without UE support    Time 12    Period Weeks    Status Achieved    Target Date 06/30/21      PT LONG TERM GOAL #4   Title Pt will decrease TUG to below 17 seconds/decrease in order to demonstrate decreased fall risk.    Baseline 04/07/2021= 21 sec with SPC; 05/11/2021= 18.85 sec with use of cane; 07/21/2021=18.88 sec  avg with use of cane; 7/5: 22.85 sec with QC; 09/29/2021= 21.45 sec using upright walker; 11/17/2021= 20. 46 sec using upright walker; 10/17= 24 sec with upright walker   Time 12    Period Weeks    Status Partially Met    Target Date 03/09/2022     PT LONG TERM GOAL #5   Title Pt will increase 10MWT by at least 0.15 m/s in order to demonstrate clinically significant improvement in community ambulation.    Baseline 04/07/2021= 0.56 m/s using SPC; 05/11/2021= 0.54 m/s using SPC; 07/01/2021= 0.6 m/s/ 7/5: 0.48 m/s without AD but with CGA; 09/29/2021= 0.64 m/s; 11/17/2021= 0.61 m/s using upright 4WW; 10/17= 0.59 m/s   Time 12    Period Weeks    Status  On-going    Target Date 03/09/2022     PT LONG TERM GOAL #6   Title Pt will increase 6MWT by at least 32m(1660f in order to demonstrate clinically significant improvement in cardiopulmonary endurance and community ambulation    Baseline 06/08/2021= 600 ft. in 5 min- Stopped due to fatigue using 4WW; 07/01/2021= 665 in 5 min 45 sec using 4WW; 7/5: 367 ft with QC; 09/29/2021= 740 feet using upright 4WW; 11/17/2021= 705 feet using upright 4WW; 12/15/2021= 680 feet with upright 4WW    Time 12   Period Weeks    Status On-going    Target Date 03/09/2022             Plan - 08/20/21 0939     Clinical Impression Statement Patient presented very symptomatic today with increased overall freezing and difficulty walking into clinic. After he performed Nustep and walking in the clinic inside of // bars - his gait quality improved with minimal to no shuffling and no freezing with 180 deg turning. He responded well to increased walking in //bars which translated well outside of bars at end of visit.  Pt will continue to benefit from skilled Physical Therapy to address remaining goals and deficits and improve his mobility while decreasing his risk of falling and improving his quality of life.     Personal Factors and Comorbidities Comorbidity 3+    Comorbidities arthritis, bladder cancer, Trigeminal Neuralgia    Examination-Activity Limitations Caring for Others;Carry;Continence;Lift;Squat;Stairs;Stand    Examination-Participation Restrictions Community Activity;Yard Work    [employer] officervolving/Moderate complexity    Rehab Potential Good    PT Frequency 2x / week    PT Duration 12 weeks    PT Treatment/Interventions ADLs/Self Care Home Management;Cryotherapy;Canalith Repostioning;Electrical Stimulation;Moist Heat;DME Instruction;Gait training;Stair training;Functional mobility training;Therapeutic activities;Therapeutic exercise;Balance training;Neuromuscular re-education;Patient/family education;Manual techniques;Passive range of motion;Dry needling;Vestibular    PT Next Visit Plan LE strength, improve step length with ambulation, muscle tissue lengthening    PT Home Exercise Plan Access Code: [email or web address] Weeki WacheePT Physical Therapist- [facility]10/19/23, 10:44 AM

## 2021-12-22 ENCOUNTER — Ambulatory Visit: Payer: Medicare HMO

## 2021-12-22 DIAGNOSIS — R278 Other lack of coordination: Secondary | ICD-10-CM

## 2021-12-22 DIAGNOSIS — R2689 Other abnormalities of gait and mobility: Secondary | ICD-10-CM

## 2021-12-22 DIAGNOSIS — M6281 Muscle weakness (generalized): Secondary | ICD-10-CM

## 2021-12-22 DIAGNOSIS — R269 Unspecified abnormalities of gait and mobility: Secondary | ICD-10-CM

## 2021-12-22 DIAGNOSIS — R262 Difficulty in walking, not elsewhere classified: Secondary | ICD-10-CM

## 2021-12-22 DIAGNOSIS — R2681 Unsteadiness on feet: Secondary | ICD-10-CM

## 2021-12-22 DIAGNOSIS — M545 Low back pain, unspecified: Secondary | ICD-10-CM

## 2021-12-22 NOTE — Therapy (Signed)
OUTPATIENT PHYSICAL THERAPY TREATMENT NOTE      Patient Name: [name] MRN: [id number] DOB:1945-06-27, 76 y.o., male Today's Date: 12/22/2021  PCP: [name]. [name], [place] PROVIDER: Dr. Gurney Maxin   PT End of Session - 12/22/21 0936     Visit Number 27    Number of Visits 90    Date for PT Re-Evaluation 03/09/22    Authorization Type [employer]    Authorization Time Period 0/2/40-9/73/53; recert 29/92/4268- 05/02/1960    Progress Note Due on Visit 70    PT Start Time 0930    PT Stop Time 1012    PT Time Calculation (min) 42 min    Equipment Utilized During Treatment Gait belt    Activity Tolerance Patient tolerated treatment well;No increased pain    Behavior During Therapy [facility] for tasks assessed/performed                       Past Medical History:  Diagnosis Date   Anxiety    Arteriosclerosis of coronary artery 11/16/2011   Overview:  Stent 10/2011 stent rca 2015 with collaterals to lad which is chronically occluded    Benign enlargement of prostate    Benign essential HTN 06/11/2014   Benign prostatic hypertrophy without urinary obstruction 07/31/2014   Bilateral cataracts 05/16/2013   Overview:  Dr. [facility]     Bone spur of foot    Left   BP (high blood pressure) 11/09/2012   Cancer ([place])    skin (forehead) and bladder   Carotid artery narrowing 02/08/2014   Depression    Detrusor hypertrophy    Diabetes ([place])    Diabetes mellitus, type 2 ([place]) 12/12/2012   Diverticulosis    Dyspnea    Esophageal reflux    Esophageal reflux    Fothergill's neuralgia 08/07/2012   Overview:  [facility]    Gastritis    GERD (gastroesophageal reflux disease)    Headache    cluster headaches   Healed myocardial infarct 11/09/2012   Hearing loss in left ear    Heart disease    Hematuria    Hemorrhoids    History of hiatal hernia 12/14/2017   small    Hypercholesteremia    Lesion of bladder    Myocardial infarct (HCC)    Presence  of stent in coronary artery 11/09/2012   Rectal bleeding    Trigeminal neuralgia    Trigeminal neuralgia    Valvular heart disease    Vitamin D deficiency    Past Surgical History:  Procedure Laterality Date   APPENDECTOMY     BOTOX INJECTION N/A 12/21/2017   Procedure: Bladder BOTOX INJECTION;  Surgeon: [name], MD;  Location: [facility] ORS;  Service: Urology;  Laterality: N/A;   BOTOX INJECTION N/A 09/11/2018   Procedure: Bladder BOTOX INJECTION;  Surgeon: [name], MD;  Location: [facility] ORS;  Service: Urology;  Laterality: N/A;   CARDIAC CATHETERIZATION     CARDIAC CATHETERIZATION N/A 01/22/2015   Procedure: Left Heart Cath;  Surgeon: Corey Skains, MD;  Location: [place] CV LAB;  Service: Cardiovascular;  Laterality: N/A;   CARDIAC CATHETERIZATION N/A 01/22/2015   Procedure: Coronary Stent Intervention;  Surgeon: Isaias Cowman, MD;  Location: [place] CV LAB;  Service: Cardiovascular;  Laterality: N/A;   CATARACT EXTRACTION, BILATERAL     COLONOSCOPY WITH PROPOFOL N/A 12/08/2015   Procedure: COLONOSCOPY WITH PROPOFOL;  Surgeon: Lollie Sails, MD;  Location: [facility] ENDOSCOPY;  Service:  Endoscopy;  Laterality: N/A;   COLONOSCOPY WITH PROPOFOL N/A 12/09/2015   Procedure: COLONOSCOPY WITH PROPOFOL;  Surgeon: Lollie Sails, MD;  Location: [facility] ENDOSCOPY;  Service: Endoscopy;  Laterality: N/A;   CORONARY ANGIOPLASTY     5 stents   CORONARY STENT PLACEMENT  2015   x5   CYSTOSCOPY N/A 09/11/2018   Procedure: CYSTOSCOPY;  Surgeon: [name], MD;  Location: [facility] ORS;  Service: Urology;  Laterality: N/A;   CYSTOSCOPY WITH BIOPSY N/A 12/21/2017   Procedure: CYSTOSCOPY WITH BIOPSY;  Surgeon: [name], MD;  Location: [facility] ORS;  Service: Urology;  Laterality: N/A;   ESOPHAGOGASTRODUODENOSCOPY (EGD) WITH PROPOFOL N/A 12/08/2015   Procedure: ESOPHAGOGASTRODUODENOSCOPY (EGD) WITH PROPOFOL;  Surgeon: Lollie Sails, MD;  Location[facility] ENDOSCOPY;   Service: Endoscopy;  Laterality: N/A;   ESOPHAGOGASTRODUODENOSCOPY (EGD) WITH PROPOFOL N/A 12/13/2017   Procedure: ESOPHAGOGASTRODUODENOSCOPY (EGD) WITH PROPOFOL;  Surgeon: Lollie Sails, MD;  Location: [facility] ENDOSCOPY;  Service: Endoscopy;  Laterality: N/A;   EYE SURGERY     HERNIA REPAIR     kidney tumor remove     TRANSURETHRAL RESECTION OF BLADDER TUMOR WITH GYRUS (TURBT-GYRUS)  41/7408   UMBILICAL HERNIA REPAIR     urethral meatotomy     Patient Active Problem List   Diagnosis Date Noted   Class 2 obesity due to excess calories with body mass index (BMI) of 36.0 to 36.9 in adult 04/11/2020   Swelling of limb 03/28/2020   Lymphedema 03/28/2020   Trigeminal neuralgia 12/25/2019   Lower limb ulcer, calf, left, limited to breakdown of skin ([place]) 12/25/2019   OSA (obstructive sleep apnea) 14/48/1856   Acute systolic CHF (congestive heart failure) ([place]) 12/12/2018   Bruising 07/10/2018   Diet-controlled type 2 diabetes mellitus ([place]) 03/24/2018   Microalbuminuria 03/24/2018   Dizziness 11/17/2017   Simple chronic bronchitis ([place]) 09/22/2017   SOBOE (shortness of breath on exertion) 02/18/2017   Chronic midline low back pain without sciatica 12/29/2016   Periodic limb movement disorder 12/29/2016   Benign essential tremor 06/22/2016   Pure hypercholesterolemia 06/20/2015   Erectile dysfunction due to arterial insufficiency 06/16/2015   Unstable angina ([place]) 01/22/2015   Abdominal aortic 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 rupture ([place]) 12/31/2014   Benign prostatic hypertrophy without urinary obstruction 07/31/2014   Enlarged prostate 07/31/2014   Benign essential HTN 06/11/2014   Carotid artery narrowing 02/08/2014   Carotid artery obstruction 02/08/2014   Bilateral carotid artery stenosis 02/08/2014   Chest pain 08/20/2013   Bilateral cataracts 05/16/2013   Cataract 05/16/2013   Clinical depression 12/12/2012   Diabetes mellitus, type 2 ([place]) 12/12/2012   Combined fat and  carbohydrate induced hyperlipemia 12/12/2012   Major depressive disorder, single episode, unspecified 12/12/2012   Acid reflux 11/09/2012   Presence of stent in coronary artery 11/09/2012   BP (high blood pressure) 11/09/2012   Healed myocardial infarct 11/09/2012   Gastro-esophageal reflux disease without esophagitis 11/09/2012   History of cardiovascular surgery 11/09/2012   Fothergill's neuralgia 08/07/2012   Swelling of testicle 04/06/2012   Disorder of male genital organ 04/06/2012   Swelling of the testicles 04/06/2012   Arteriosclerosis of coronary artery 11/16/2011   CAD in native artery 11/16/2011   Fatigue 06/04/2011   Avitaminosis D 11/30/2010    REFERRING DIAG: Repeated falls   THERAPY DIAG:   Difficulty in walking, not elsewhere classified  Muscle weakness (generalized)  Other lack of coordination  Abnormality of gait and mobility  Other abnormalities of gait and mobility  Unsteadiness on  feet  Chronic bilateral low back pain without sciatica  Rationale for Evaluation and Treatment Rehabilitation   Subjective Assessment - 09/22/21 0936     Subjective  Patient reports his MD adjusted his parkinson's meds and that he is moving around better.    Patient is accompained by: Family member -Wife   Pertinent History Patient is a 76 year old male with referral for Physical Therapy with diagnosis of Parkinsons disease and history of falling. He reports he diagnosed last year with Parkinsons and has improved his tremors with use of medication but reports worsening shuffling and difficulty with mobility. Patient has past medical history signifiant for Parkinsons; Arthritis, bladder cancer, Trigemic Neuralgia. He lives with wife in 1 level home and current using cane with reported recent fall.    Limitations Walking;Lifting;Standing;House hold activities    How long can you sit comfortably? No limits    How long can you stand comfortably? 15 min- limited due to back pain  and foot numbess    How long can you walk comfortably? about 80 feet    Patient Stated Goals Improve my balance, be able to get up off the floor so I can maybe work in my garage again.    Currently in Pain? No/denies               PRECAUTIONS: Falls   PAIN:  No pain reported.     TODAY'S TREATMENT: 12/22/2021   THEREX:  Nustep L1 for 2 min and L3 for 2 min and level 4 for 2 min for BUE/LE for improved overall strength, coordination of movement, and mobility x 6 min. VC to keep SPM > 50 Donkey kick BLE x 15 reps each LE Calf raises BLE x 20 reps   NMR:  Stand up without UE support - amb.  6 feet forward without a device- then 180 deg turn and walk back - turn another 180 deg and sit. Patient performed a total of 10 times- ranging from 10 sec  Heel to toe gait sequencing at support bar with references for heel strike position and toe off position x 20 reps each LE High knee marching with 5lb AW- x 20 reps each LE Ambulation with 5lb AW's > 150 feet with upright 4WW and CGA- and another 150 feet without weights- Improved reciprocal steps from previous visit and less cues for walking    Education provided throughout session via VC/TC and demonstration to facilitate movement at target joints and correct muscle activation for all testing and exercises performed.     PATIENT EDUCATION: Education details: form/technique with exercise Person educated: Patient Education method: Explanation, Demonstration, Tactile cues, and Verbal cues Education comprehension: verbalized understanding, returned demonstration, verbal cues required, tactile cues required, and needs further education   HOME EXERCISE PROGRAM: No updates today   PT Short Term Goals -       PT SHORT TERM GOAL #1   Title Pt will be independent with INITIAL  HEP in order to improve strength and balance in order to decrease fall risk and improve function at home and work.    Baseline 04/07/2021= No formal HEP in place.  05/11/2021= Patient reports doing pretty good- compliant with walking program and his stretching/strengthening.    Time 6    Period Weeks    Status Achieved    Target Date 05/19/21      PT SHORT TERM GOAL #2   Title Pt will decrease 5TSTS by at least 3 seconds in order to  demonstrate clinically significant improvement in LE strength.    Baseline 04/07/2021= 25 sec; 05/11/2021= 18.65 sec    Time 6    Period Weeks    Status Achieved    Target Date 06/30/21              PT Long Term Goals -       PT LONG TERM GOAL #1   Title Pt will be independent with FINAL HEP in order to improve strength and balance in order to decrease fall risk and improve function at home and work.    Baseline 04/07/2021= No formal HEP in place. 07/01/2021=Patient reports performing walking and some standing LE strengthening exercises as instructed and states no questions at this time.  09/02/21: Pt reports he feels indep with exercises but is not performing HEP as much as he should; 11/17/2021= Patient reports compliant with current HEP- "I do them as best I can." 12/15/2021- Patient reports doing some exercises and walking but not feeling well in past week and denied performing HEP. Will keep goal active to ensure compliance and update HEP as appropriate.    Time 12    Period Weeks    Status Partially Met    Target Date 03/09/2022     PT LONG TERM GOAL #2   Title Pt will improve FOTO to target score of 45% to display perceived improvements in ability to complete ADL's.    Baseline 2/7/22023= 41%; FOTO=53%    Time 12    Period Weeks    Status Achieved    Target Date 06/30/21      PT LONG TERM GOAL #3   Title Pt will decrease 5TSTS by at least 5 seconds in order to demonstrate clinically significant improvement in LE strength.    Baseline 04/07/2021= 25 sec; 05/11/2021= 18.65 sec without UE support; 4/10= 13.76 sec without UE support    Time 12    Period Weeks    Status Achieved    Target Date 06/30/21      PT LONG  TERM GOAL #4   Title Pt will decrease TUG to below 17 seconds/decrease in order to demonstrate decreased fall risk.    Baseline 04/07/2021= 21 sec with SPC; 05/11/2021= 18.85 sec with use of cane; 07/21/2021=18.88 sec  avg with use of cane; 7/5: 22.85 sec with QC; 09/29/2021= 21.45 sec using upright walker; 11/17/2021= 20. 46 sec using upright walker; 10/17= 24 sec with upright walker   Time 12    Period Weeks    Status Partially Met    Target Date 03/09/2022     PT LONG TERM GOAL #5   Title Pt will increase 10MWT by at least 0.15 m/s in order to demonstrate clinically significant improvement in community ambulation.    Baseline 04/07/2021= 0.56 m/s using SPC; 05/11/2021= 0.54 m/s using SPC; 07/01/2021= 0.6 m/s/ 7/5: 0.48 m/s without AD but with CGA; 09/29/2021= 0.64 m/s; 11/17/2021= 0.61 m/s using upright 4WW; 10/17= 0.59 m/s   Time 12    Period Weeks    Status On-going    Target Date 03/09/2022     PT LONG TERM GOAL #6   Title Pt will increase 6MWT by at least 26m(1642f in order to demonstrate clinically significant improvement in cardiopulmonary endurance and community ambulation    Baseline 06/08/2021= 600 ft. in 5 min- Stopped due to fatigue using 4WW; 07/01/2021= 665 in 5 min 45 sec using 4WW; 7/5: 367 ft with QC; 09/29/2021= 740 feet using upright 4WW; 11/17/2021= 705  feet using upright 4WW; 12/15/2021= 680 feet with upright 4WW   Time 12   Period Weeks    Status On-going    Target Date 03/09/2022             Plan - 08/20/21 0939     Clinical Impression Statement Patient presented with good motivation for today's session. He was able to walk the entire session without any evidence of freezing including turning and all mobility activities. He demonstrated improved step length overall after heel/toe activity.  Pt will continue to benefit from skilled Physical Therapy to address remaining goals and deficits and improve his mobility while decreasing his risk of falling and improving his quality of life.      Personal Factors and Comorbidities Comorbidity 3+    Comorbidities arthritis, bladder cancer, Trigeminal Neuralgia    Examination-Activity Limitations Caring for Others;Carry;Continence;Lift;Squat;Stairs;Stand    Examination-Participation Restrictions Community Activity;Yard Work    [employer] Evolving/Moderate complexity    Rehab Potential Good    PT Frequency 2x / week    PT Duration 12 weeks    PT Treatment/Interventions ADLs/Self Care Home Management;Cryotherapy;Canalith Repostioning;Electrical Stimulation;Moist Heat;DME Instruction;Gait training;Stair training;Functional mobility training;Therapeutic activities;Therapeutic exercise;Balance training;Neuromuscular re-education;Patient/family education;Manual techniques;Passive range of motion;Dry needling;Vestibular    PT Next Visit Plan LE strength, improve step length with ambulation, muscle tissue lengthening    PT Home Exercise Plan Access Code: [place], PT Physical Therapist- [facility]  12/22/21, 11:02 AM

## 2021-12-24 ENCOUNTER — Ambulatory Visit: Payer: Medicare HMO

## 2021-12-24 DIAGNOSIS — R2689 Other abnormalities of gait and mobility: Secondary | ICD-10-CM

## 2021-12-24 DIAGNOSIS — R278 Other lack of coordination: Secondary | ICD-10-CM

## 2021-12-24 DIAGNOSIS — R269 Unspecified abnormalities of gait and mobility: Secondary | ICD-10-CM

## 2021-12-24 DIAGNOSIS — R2681 Unsteadiness on feet: Secondary | ICD-10-CM

## 2021-12-24 DIAGNOSIS — M6281 Muscle weakness (generalized): Secondary | ICD-10-CM

## 2021-12-24 DIAGNOSIS — R262 Difficulty in walking, not elsewhere classified: Secondary | ICD-10-CM | POA: Diagnosis not present

## 2021-12-24 NOTE — Therapy (Signed)
OUTPATIENT PHYSICAL THERAPY TREATMENT NOTE      Patient Name: [name] MRN: [id number] DOB:[birth year], 76 y.o., male Today's Date: 12/24/2021  PCP: [name]. [name], [place] PROVIDER: Dr. Gurney Maxin   PT End of Session - 12/24/21 0934     Visit Number 74    Number of Visits 37    Date for PT Re-Evaluation 03/09/22    Authorization Type [employer]    Authorization Time Period 06/01/13-4/00/86; recert 76/19/5093- 04/06/7122    Progress Note Due on Visit 70    PT Start Time 0930    PT Stop Time 1013    PT Time Calculation (min) 43 min    Equipment Utilized During Treatment Gait belt    Activity Tolerance Patient tolerated treatment well;No increased pain    Behavior During Therapy [facility] for tasks assessed/performed                       Past Medical History:  Diagnosis Date   Anxiety    Arteriosclerosis of coronary artery 11/16/2011   Overview:  Stent 10/2011 stent rca 2015 with collaterals to lad which is chronically occluded    Benign enlargement of prostate    Benign essential HTN 06/11/2014   Benign prostatic hypertrophy without urinary obstruction 07/31/2014   Bilateral cataracts 05/16/2013   Overview:  Dr. [facility]     Bone spur of foot    Left   BP (high blood pressure) 11/09/2012   Cancer ([place])    skin (forehead) and bladder   Carotid artery narrowing 02/08/2014   Depression    Detrusor hypertrophy    Diabetes ([place])    Diabetes mellitus, type 2 ([place]) 12/12/2012   Diverticulosis    Dyspnea    Esophageal reflux    Esophageal reflux    Fothergill's neuralgia 08/07/2012   Overview:  [facility]    Gastritis    GERD (gastroesophageal reflux disease)    Headache    cluster headaches   Healed myocardial infarct 11/09/2012   Hearing loss in left ear    Heart disease    Hematuria    Hemorrhoids    History of hiatal hernia 12/14/2017   small    Hypercholesteremia    Lesion of bladder    Myocardial infarct (HCC)    Presence  of stent in coronary artery 11/09/2012   Rectal bleeding    Trigeminal neuralgia    Trigeminal neuralgia    Valvular heart disease    Vitamin D deficiency    Past Surgical History:  Procedure Laterality Date   APPENDECTOMY     BOTOX INJECTION N/A 12/21/2017   Procedure: Bladder BOTOX INJECTION;  Surgeon: [name], MD;  Location: [facility] ORS;  Service: Urology;  Laterality: N/A;   BOTOX INJECTION N/A 09/11/2018   Procedure: Bladder BOTOX INJECTION;  Surgeon: [name], MD;  Location: [facility];  Service: Urology;  Laterality: N/A;   CARDIAC CATHETERIZATION     CARDIAC CATHETERIZATION N/A 01/22/2015   Procedure: Left Heart Cath;  Surgeon: Corey Skains, MD;  Location: [facility];  Service: Cardiovascular;  Laterality: N/A;   CARDIAC CATHETERIZATION N/A 01/22/2015   Procedure: Coronary Stent Intervention;  Surgeon: Isaias Cowman, MD;  Location: [place] [facility];  Service: Cardiovascular;  Laterality: N/A;   CATARACT EXTRACTION, BILATERAL     COLONOSCOPY WITH PROPOFOL N/A 12/08/2015   Procedure: COLONOSCOPY WITH PROPOFOL;  Surgeon: Lollie Sails, MD;  Location: [facility] ENDOSCOPY;  Service:  Endoscopy;  Laterality: N/A;   COLONOSCOPY WITH PROPOFOL N/A 12/09/2015   Procedure: COLONOSCOPY WITH PROPOFOL;  Surgeon: Lollie Sails, MD;  Location: [facility] ENDOSCOPY;  Service: Endoscopy;  Laterality: N/A;   CORONARY ANGIOPLASTY     5 stents   CORONARY STENT PLACEMENT  2015   x5   CYSTOSCOPY N/A 09/11/2018   Procedure: CYSTOSCOPY;  Surgeon: [name], MD;  Location: [facility];  Service: Urology;  Laterality: N/A;   CYSTOSCOPY WITH BIOPSY N/A 12/21/2017   Procedure: CYSTOSCOPY WITH BIOPSY;  Surgeon: [name], MD;  Location: [facility];  Service: Urology;  Laterality: N/A;   ESOPHAGOGASTRODUODENOSCOPY (EGD) WITH PROPOFOL N/A 12/08/2015   Procedure: ESOPHAGOGASTRODUODENOSCOPY (EGD) WITH PROPOFOL;  Surgeon: Lollie Sails, MD;  Location: [facility] ENDOSCOPY;   Service: Endoscopy;  Laterality: N/A;   ESOPHAGOGASTRODUODENOSCOPY (EGD) WITH PROPOFOL N/A 12/13/2017   Procedure: ESOPHAGOGASTRODUODENOSCOPY (EGD) WITH PROPOFOL;  Surgeon: Lollie Sails, MD;  Location: [facility] ENDOSCOPY;  Service: Endoscopy;  Laterality: N/A;   EYE SURGERY     HERNIA REPAIR     kidney tumor remove     TRANSURETHRAL RESECTION OF BLADDER TUMOR WITH GYRUS (TURBT-GYRUS)  41/7408   UMBILICAL HERNIA REPAIR     urethral meatotomy     Patient Active Problem List   Diagnosis Date Noted   Class 2 obesity due to excess calories with body mass index (BMI) of 36.0 to 36.9 in adult 04/11/2020   Swelling of limb 03/28/2020   Lymphedema 03/28/2020   Trigeminal neuralgia 12/25/2019   Lower limb ulcer, calf, left, limited to breakdown of skin ([place]) 12/25/2019   OSA (obstructive sleep apnea) 14/48/1856   Acute systolic CHF (congestive heart failure) ([place]) 12/12/2018   Bruising 07/10/2018   Diet-controlled type 2 diabetes mellitus ([place]) 03/24/2018   Microalbuminuria 03/24/2018   Dizziness 11/17/2017   Simple chronic bronchitis ([place]) 09/22/2017   SOBOE (shortness of breath on exertion) 02/18/2017   Chronic midline low back pain without sciatica 12/29/2016   Periodic limb movement disorder 12/29/2016   Benign essential tremor 06/22/2016   Pure hypercholesterolemia 06/20/2015   Erectile dysfunction due to arterial insufficiency 06/16/2015   Unstable angina ([place]) 01/22/2015   Abdominal aortic 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 rupture ([place]) 12/31/2014   Benign prostatic hypertrophy without urinary obstruction 07/31/2014   Enlarged prostate 07/31/2014   Benign essential HTN 06/11/2014   Carotid artery narrowing 02/08/2014   Carotid artery obstruction 02/08/2014   Bilateral carotid artery stenosis 02/08/2014   Chest pain 08/20/2013   Bilateral cataracts 05/16/2013   Cataract 05/16/2013   Clinical depression 12/12/2012   Diabetes mellitus, type 2 ([place]) 12/12/2012   Combined fat and  carbohydrate induced hyperlipemia 12/12/2012   Major depressive disorder, single episode, unspecified 12/12/2012   Acid reflux 11/09/2012   Presence of stent in coronary artery 11/09/2012   BP (high blood pressure) 11/09/2012   Healed myocardial infarct 11/09/2012   Gastro-esophageal reflux disease without esophagitis 11/09/2012   History of cardiovascular surgery 11/09/2012   Fothergill's neuralgia 08/07/2012   Swelling of testicle 04/06/2012   Disorder of male genital organ 04/06/2012   Swelling of the testicles 04/06/2012   Arteriosclerosis of coronary artery 11/16/2011   CAD in native artery 11/16/2011   Fatigue 06/04/2011   Avitaminosis D 11/30/2010    REFERRING DIAG: Repeated falls   THERAPY DIAG:   Difficulty in walking, not elsewhere classified  Muscle weakness (generalized)  Other lack of coordination  Abnormality of gait and mobility  Other abnormalities of gait and mobility  Unsteadiness on  feet  Rationale for Evaluation and Treatment Rehabilitation   Subjective Assessment - 09/22/21 0936     Subjective  Pt reports doing better since medication updates. Ambulating better without falls.    Patient is accompained by: Family member -Wife   Pertinent History Patient is a 76 year old male with referral for Physical Therapy with diagnosis of Parkinsons disease and history of falling. He reports he diagnosed last year with Parkinsons and has improved his tremors with use of medication but reports worsening shuffling and difficulty with mobility. Patient has past medical history signifiant for Parkinsons; Arthritis, bladder cancer, Trigemic Neuralgia. He lives with wife in 1 level home and current using cane with reported recent fall.    Limitations Walking;Lifting;Standing;House hold activities    How long can you sit comfortably? No limits    How long can you stand comfortably? 15 min- limited due to back pain and foot numbess    How long can you walk comfortably?  about 80 feet    Patient Stated Goals Improve my balance, be able to get up off the floor so I can maybe work in my garage again.    Currently in Pain? No/denies               PRECAUTIONS: Falls   PAIN:  No pain reported.     TODAY'S TREATMENT: 12/24/2021   There.ex:   Nu-Step L2 for 2.5 minutes then L3 for 2.5 minutes. Attempted to keep SPM > 55 with VC's.   Donkey Kick BLE: 2x15/LE   Calf raises BLE: 2x20  Neuro Re-Ed:   Gait mechanics in // bars with forwards and backwards gait with light BUE support with consistent step lengths, foot clearance in swing phase and heel strikes. With backwards gait working on hip extension for posterior chain strengthening. X10 laps in // bars with 5# AW's.    Side step marches: x6 laps BUE support with mod VC's for hip/knee flexion to improve foot clearance. SBA.    5# AW 3 x 1/2 bolster gait step overs initially with 5 laps in step to pattern but able to progress to step through pattern x3 laps. BUE support. SBA.     Turns in // bars working on hip flexion with turning at end of bars around cone to improve foot clearance. SBA.    Gait with upright RW 2x160' with reciprocal step through gait with min to mod VC's for step lengths, heel strike. Navigating around crowded gym equipment and pt's to improve larger steps with tighter spaces such as in community areas. CGA.          PATIENT EDUCATION: Education details: form/technique with exercise Person educated: Patient Education method: Explanation, Demonstration, Tactile cues, and Verbal cues Education comprehension: verbalized understanding, returned demonstration, verbal cues required, tactile cues required, and needs further education   HOME EXERCISE PROGRAM: No updates today   PT Short Term Goals -       PT SHORT TERM GOAL #1   Title Pt will be independent with INITIAL  HEP in order to improve strength and balance in order to decrease fall risk and improve function at home and  work.    Baseline 04/07/2021= No formal HEP in place. 05/11/2021= Patient reports doing pretty good- compliant with walking program and his stretching/strengthening.    Time 6    Period Weeks    Status Achieved    Target Date 05/19/21      PT SHORT TERM GOAL #2   Title  Pt will decrease 5TSTS by at least 3 seconds in order to demonstrate clinically significant improvement in LE strength.    Baseline 04/07/2021= 25 sec; 05/11/2021= 18.65 sec    Time 6    Period Weeks    Status Achieved    Target Date 06/30/21              PT Long Term Goals -       PT LONG TERM GOAL #1   Title Pt will be independent with FINAL HEP in order to improve strength and balance in order to decrease fall risk and improve function at home and work.    Baseline 04/07/2021= No formal HEP in place. 07/01/2021=Patient reports performing walking and some standing LE strengthening exercises as instructed and states no questions at this time.  09/02/21: Pt reports he feels indep with exercises but is not performing HEP as much as he should; 11/17/2021= Patient reports compliant with current HEP- "I do them as best I can." 12/15/2021- Patient reports doing some exercises and walking but not feeling well in past week and denied performing HEP. Will keep goal active to ensure compliance and update HEP as appropriate.    Time 12    Period Weeks    Status Partially Met    Target Date 03/09/2022     PT LONG TERM GOAL #2   Title Pt will improve FOTO to target score of 45% to display perceived improvements in ability to complete ADL's.    Baseline 2/7/22023= 41%; FOTO=53%    Time 12    Period Weeks    Status Achieved    Target Date 06/30/21      PT LONG TERM GOAL #3   Title Pt will decrease 5TSTS by at least 5 seconds in order to demonstrate clinically significant improvement in LE strength.    Baseline 04/07/2021= 25 sec; 05/11/2021= 18.65 sec without UE support; 4/10= 13.76 sec without UE support    Time 12    Period Weeks     Status Achieved    Target Date 06/30/21      PT LONG TERM GOAL #4   Title Pt will decrease TUG to below 17 seconds/decrease in order to demonstrate decreased fall risk.    Baseline 04/07/2021= 21 sec with SPC; 05/11/2021= 18.85 sec with use of cane; 07/21/2021=18.88 sec  avg with use of cane; 7/5: 22.85 sec with QC; 09/29/2021= 21.45 sec using upright walker; 11/17/2021= 20. 46 sec using upright walker; 10/17= 24 sec with upright walker   Time 12    Period Weeks    Status Partially Met    Target Date 03/09/2022     PT LONG TERM GOAL #5   Title Pt will increase 10MWT by at least 0.15 m/s in order to demonstrate clinically significant improvement in community ambulation.    Baseline 04/07/2021= 0.56 m/s using SPC; 05/11/2021= 0.54 m/s using SPC; 07/01/2021= 0.6 m/s/ 7/5: 0.48 m/s without AD but with CGA; 09/29/2021= 0.64 m/s; 11/17/2021= 0.61 m/s using upright 4WW; 10/17= 0.59 m/s   Time 12    Period Weeks    Status On-going    Target Date 03/09/2022     PT LONG TERM GOAL #6   Title Pt will increase 6MWT by at least 22m (156ft) in order to demonstrate clinically significant improvement in cardiopulmonary endurance and community ambulation    Baseline 06/08/2021= 600 ft. in 5 min- Stopped due to fatigue using 4WW; 07/01/2021= 665 in 5 min 45 sec using 0SP; 7/5:  367 ft with QC; 09/29/2021= 740 feet using upright 4WW; 11/17/2021= 705 feet using upright 4WW; 12/15/2021= 680 feet with upright 4WW   Time 12   Period Weeks    Status On-going    Target Date 03/09/2022             Plan - 08/20/21 0939     Clinical Impression Statement Continuing PT POC with focus on LE strength, gait mechanics, and balance to optimize gait and reduced risk of falls. Pt displaying excellent ability today with all exercises improving step heights and lengths with very minimal VC's needed for foot clearance and reduction of shuffling gait. When applied practically at end of session with walker, pt has only 1-2 instances of reverting to  shuffling gait but pt aware and able to correct independently. Pt will continue to benefit from skilled PT services to reduce risk of falls and improve QoL.     Personal Factors and Comorbidities Comorbidity 3+    Comorbidities arthritis, bladder cancer, Trigeminal Neuralgia    Examination-Activity Limitations Caring for Others;Carry;Continence;Lift;Squat;Stairs;Stand    Examination-Participation Restrictions Community Activity;Yard Work    [employer] Evolving/Moderate complexity    Rehab Potential Good    PT Frequency 2x / week    PT Duration 12 weeks    PT Treatment/Interventions ADLs/Self Care Home Management;Cryotherapy;Canalith Repostioning;Electrical Stimulation;Moist Heat;DME Instruction;Gait training;Stair training;Functional mobility training;Therapeutic activities;Therapeutic exercise;Balance training;Neuromuscular re-education;Patient/family education;Manual techniques;Passive range of motion;Dry needling;Vestibular    PT Next Visit Plan LE strength, improve step length with ambulation, muscle tissue lengthening    PT Home Exercise Plan Access Code: [email or web address]              Salem Caster. Fairly IV, PT, DPT Physical Therapist- [facility]  12/24/21, 10:46 AM

## 2021-12-28 NOTE — Therapy (Signed)
OUTPATIENT PHYSICAL THERAPY TREATMENT NOTE/Physical Therapy Progress Note   Dates of reporting period  11/17/2021   to   12/29/2021       Patient Name: [name] MRN: [id number] DOB:09-Nov-1945, 76 y.o., male Today's Date: 12/29/2021  PCP: [name]. [name], FNP REFERRING PROVIDER: Dr. Gurney Maxin   PT End of Session - 12/29/21 0935     Visit Number 53    Number of Visits 17    Date for PT Re-Evaluation 03/09/22    Authorization Type [employer]    Authorization Time Period 10/01/36-2/50/53; recert 97/67/3419- 05/06/9022    Progress Note Due on Visit 80    PT Start Time 0930    PT Stop Time 1013    PT Time Calculation (min) 43 min    Equipment Utilized During Treatment Gait belt    Activity Tolerance Patient tolerated treatment well;No increased pain    Behavior During Therapy [facility] for tasks assessed/performed                        Past Medical History:  Diagnosis Date   Anxiety    Arteriosclerosis of coronary artery 11/16/2011   Overview:  Stent 10/2011 stent rca 2015 with collaterals to lad which is chronically occluded    Benign enlargement of prostate    Benign essential HTN 06/11/2014   Benign prostatic hypertrophy without urinary obstruction 07/31/2014   Bilateral cataracts 05/16/2013   Overview:  Dr. [facility]     Bone spur of foot    Left   BP (high blood pressure) 11/09/2012   Cancer ([place])    skin (forehead) and bladder   Carotid artery narrowing 02/08/2014   Depression    Detrusor hypertrophy    Diabetes ([place])    Diabetes mellitus, type 2 ([place]) 12/12/2012   Diverticulosis    Dyspnea    Esophageal reflux    Esophageal reflux    Fothergill's neuralgia 08/07/2012   Overview:  [facility]    Gastritis    GERD (gastroesophageal reflux disease)    Headache    cluster headaches   Healed myocardial infarct 11/09/2012   Hearing loss in left ear    Heart disease    Hematuria    Hemorrhoids    History of hiatal hernia 12/14/2017    small    Hypercholesteremia    Lesion of bladder    Myocardial infarct (HCC)    Presence of stent in coronary artery 11/09/2012   Rectal bleeding    Trigeminal neuralgia    Trigeminal neuralgia    Valvular heart disease    Vitamin D deficiency    Past Surgical History:  Procedure Laterality Date   APPENDECTOMY     BOTOX INJECTION N/A 12/21/2017   Procedure: Bladder BOTOX INJECTION;  Surgeon: [name], MD;  Location: [facility] ORS;  Service: Urology;  Laterality: N/A;   BOTOX INJECTION N/A 09/11/2018   Procedure: Bladder BOTOX INJECTION;  Surgeon: [name], MD;  Location: [facility] ORS;  Service: Urology;  Laterality: N/A;   CARDIAC CATHETERIZATION     CARDIAC CATHETERIZATION N/A 01/22/2015   Procedure: Left Heart Cath;  Surgeon: Corey Skains, MD;  Location: [place] CV LAB;  Service: Cardiovascular;  Laterality: N/A;   CARDIAC CATHETERIZATION N/A 01/22/2015   Procedure: Coronary Stent Intervention;  Surgeon: Isaias Cowman, MD;  Location: [place] CV LAB;  Service: Cardiovascular;  Laterality: N/A;   CATARACT EXTRACTION, BILATERAL     COLONOSCOPY WITH PROPOFOL N/A  12/08/2015   Procedure: COLONOSCOPY WITH PROPOFOL;  Surgeon: Lollie Sails, MD;  Location[facility] ENDOSCOPY;  Service: Endoscopy;  Laterality: N/A;   COLONOSCOPY WITH PROPOFOL N/A 12/09/2015   Procedure: COLONOSCOPY WITH PROPOFOL;  Surgeon: Lollie Sails, MD;  Location: [facility] ENDOSCOPY;  Service: Endoscopy;  Laterality: N/A;   CORONARY ANGIOPLASTY     5 stents   CORONARY STENT PLACEMENT  2015   x5   CYSTOSCOPY N/A 09/11/2018   Procedure: CYSTOSCOPY;  Surgeon: [name], MD;  Location: [facility] ORS;  Service: Urology;  Laterality: N/A;   CYSTOSCOPY WITH BIOPSY N/A 12/21/2017   Procedure: CYSTOSCOPY WITH BIOPSY;  Surgeon: [name], MD;  Location: [facility] ORS;  Service: Urology;  Laterality: N/A;   ESOPHAGOGASTRODUODENOSCOPY (EGD) WITH PROPOFOL N/A 12/08/2015   Procedure:  ESOPHAGOGASTRODUODENOSCOPY (EGD) WITH PROPOFOL;  Surgeon: Lollie Sails, MD;  Location: [facility] ENDOSCOPY;  Service: Endoscopy;  Laterality: N/A;   ESOPHAGOGASTRODUODENOSCOPY (EGD) WITH PROPOFOL N/A 12/13/2017   Procedure: ESOPHAGOGASTRODUODENOSCOPY (EGD) WITH PROPOFOL;  Surgeon: Lollie Sails, MD;  Location: [facility] ENDOSCOPY;  Service: Endoscopy;  Laterality: N/A;   EYE SURGERY     HERNIA REPAIR     kidney tumor remove     TRANSURETHRAL RESECTION OF BLADDER TUMOR WITH GYRUS (TURBT-GYRUS)  82/4235   UMBILICAL HERNIA REPAIR     urethral meatotomy     Patient Active Problem List   Diagnosis Date Noted   Class 2 obesity due to excess calories with body mass index (BMI) of 36.0 to 36.9 in adult 04/11/2020   Swelling of limb 03/28/2020   Lymphedema 03/28/2020   Trigeminal neuralgia 12/25/2019   Lower limb ulcer, calf, left, limited to breakdown of skin ([place]) 12/25/2019   OSA (obstructive sleep apnea) 36/14/4315   Acute systolic CHF (congestive heart failure) ([place]) 12/12/2018   Bruising 07/10/2018   Diet-controlled type 2 diabetes mellitus ([place]) 03/24/2018   Microalbuminuria 03/24/2018   Dizziness 11/17/2017   Simple chronic bronchitis ([place]) 09/22/2017   SOBOE (shortness of breath on exertion) 02/18/2017   Chronic midline low back pain without sciatica 12/29/2016   Periodic limb movement disorder 12/29/2016   Benign essential tremor 06/22/2016   Pure hypercholesterolemia 06/20/2015   Erectile dysfunction due to arterial insufficiency 06/16/2015   Unstable angina ([place]) 01/22/2015   Abdominal aortic 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 rupture ([place]) 12/31/2014   Benign prostatic hypertrophy without urinary obstruction 07/31/2014   Enlarged prostate 07/31/2014   Benign essential HTN 06/11/2014   Carotid artery narrowing 02/08/2014   Carotid artery obstruction 02/08/2014   Bilateral carotid artery stenosis 02/08/2014   Chest pain 08/20/2013   Bilateral cataracts 05/16/2013   Cataract 05/16/2013    Clinical depression 12/12/2012   Diabetes mellitus, type 2 ([place]) 12/12/2012   Combined fat and carbohydrate induced hyperlipemia 12/12/2012   Major depressive disorder, single episode, unspecified 12/12/2012   Acid reflux 11/09/2012   Presence of stent in coronary artery 11/09/2012   BP (high blood pressure) 11/09/2012   Healed myocardial infarct 11/09/2012   Gastro-esophageal reflux disease without esophagitis 11/09/2012   History of cardiovascular surgery 11/09/2012   Fothergill's neuralgia 08/07/2012   Swelling of testicle 04/06/2012   Disorder of male genital organ 04/06/2012   Swelling of the testicles 04/06/2012   Arteriosclerosis of coronary artery 11/16/2011   CAD in native artery 11/16/2011   Fatigue 06/04/2011   Avitaminosis D 11/30/2010    REFERRING DIAG: Repeated falls   THERAPY DIAG:   Difficulty in walking, not elsewhere classified  Muscle weakness (generalized)  Other  lack of coordination  Abnormality of gait and mobility  Other abnormalities of gait and mobility  Unsteadiness on feet  Chronic bilateral low back pain without sciatica  Rationale for Evaluation and Treatment Rehabilitation   Subjective Assessment - 09/22/21 0936     Subjective  Pt reports still doing well overall- no falls or pain   Patient is accompained by: Family member -Wife   Pertinent History Patient is a 76 year old male with referral for Physical Therapy with diagnosis of Parkinsons disease and history of falling. He reports he diagnosed last year with Parkinsons and has improved his tremors with use of medication but reports worsening shuffling and difficulty with mobility. Patient has past medical history signifiant for Parkinsons; Arthritis, bladder cancer, Trigemic Neuralgia. He lives with wife in 1 level home and current using cane with reported recent fall.    Limitations Walking;Lifting;Standing;House hold activities    How long can you sit comfortably? No limits    How  long can you stand comfortably? 15 min- limited due to back pain and foot numbess    How long can you walk comfortably? about 80 feet    Patient Stated Goals Improve my balance, be able to get up off the floor so I can maybe work in my garage again.    Currently in Pain? No/denies               PRECAUTIONS: Falls   PAIN:  No pain reported.     TODAY'S TREATMENT: 12/29/2021   There.ex:   Nu-Step: INTERVAL Training    Level 1   Level 2    Level 5   Level 2   Level 5  VC to keep SPM > 55 with VC's.   Donkey Kick BLE: x15  Standing Hip ext x 15 reps Alt LE   Calf raises BLE: 2x20  Sit to stand x 12 without UE support.  Neuro Re-Ed:   Step up onto 6" block x 12 reps each LE with 4 # AW today  Side step up onto 6" block x 12 reps each LE   Backwards gait working on hip extension for posterior chain strengthening. X10 laps in // bars with 5# AW's.      Goals were just assessed 2 weeks ago- Did reassess gait speed today- Mild improvement since last assessed 2 weeks ago.      Gait with upright RW 320' with use of 5lb AW BLE exhibiting short reciprocal step through gait with min to mod VC's for step lengths, heel strike. No episodes of freezing with turning or walking through narrow spaces. CGA.          PATIENT EDUCATION: Education details: form/technique with exercise Person educated: Patient Education method: Explanation, Demonstration, Tactile cues, and Verbal cues Education comprehension: verbalized understanding, returned demonstration, verbal cues required, tactile cues required, and needs further education   HOME EXERCISE PROGRAM: No updates today   PT Short Term Goals -       PT SHORT TERM GOAL #1   Title Pt will be independent with INITIAL  HEP in order to improve strength and balance in order to decrease fall risk and improve function at home and work.    Baseline 04/07/2021= No formal HEP in place. 05/11/2021= Patient reports doing pretty good- compliant  with walking program and his stretching/strengthening.    Time 6    Period Weeks    Status Achieved    Target Date 05/19/21  PT SHORT TERM GOAL #2   Title Pt will decrease 5TSTS by at least 3 seconds in order to demonstrate clinically significant improvement in LE strength.    Baseline 04/07/2021= 25 sec; 05/11/2021= 18.65 sec    Time 6    Period Weeks    Status Achieved    Target Date 06/30/21              PT Long Term Goals -       PT LONG TERM GOAL #1   Title Pt will be independent with FINAL HEP in order to improve strength and balance in order to decrease fall risk and improve function at home and work.    Baseline 04/07/2021= No formal HEP in place. 07/01/2021=Patient reports performing walking and some standing LE strengthening exercises as instructed and states no questions at this time.  09/02/21: Pt reports he feels indep with exercises but is not performing HEP as much as he should; 11/17/2021= Patient reports compliant with current HEP- "I do them as best I can." 12/15/2021- Patient reports doing some exercises and walking but not feeling well in past week and denied performing HEP. Will keep goal active to ensure compliance and update HEP as appropriate.    Time 12    Period Weeks    Status Partially Met    Target Date 03/09/2022     PT LONG TERM GOAL #2   Title Pt will improve FOTO to target score of 45% to display perceived improvements in ability to complete ADL's.    Baseline 2/7/22023= 41%; FOTO=53%    Time 12    Period Weeks    Status Achieved    Target Date 06/30/21      PT LONG TERM GOAL #3   Title Pt will decrease 5TSTS by at least 5 seconds in order to demonstrate clinically significant improvement in LE strength.    Baseline 04/07/2021= 25 sec; 05/11/2021= 18.65 sec without UE support; 4/10= 13.76 sec without UE support    Time 12    Period Weeks    Status Achieved    Target Date 06/30/21      PT LONG TERM GOAL #4   Title Pt will decrease TUG to below 17  seconds/decrease in order to demonstrate decreased fall risk.    Baseline 04/07/2021= 21 sec with SPC; 05/11/2021= 18.85 sec with use of cane; 07/21/2021=18.88 sec  avg with use of cane; 7/5: 22.85 sec with QC; 09/29/2021= 21.45 sec using upright walker; 11/17/2021= 20. 46 sec using upright walker; 10/17= 24 sec with upright walker   Time 12    Period Weeks    Status Partially Met    Target Date 03/09/2022     PT LONG TERM GOAL #5   Title Pt will increase 10MWT by at least 0.15 m/s in order to demonstrate clinically significant improvement in community ambulation.    Baseline 04/07/2021= 0.56 m/s using SPC; 05/11/2021= 0.54 m/s using SPC; 07/01/2021= 0.6 m/s/ 7/5: 0.48 m/s without AD but with CGA; 09/29/2021= 0.64 m/s; 11/17/2021= 0.61 m/s using upright 4WW; 10/17= 0.59 m/s; 12/29/2021= 0.64 m/s   Time 12    Period Weeks    Status On-going    Target Date 03/09/2022     PT LONG TERM GOAL #6   Title Pt will increase 6MWT by at least 47m(1673f in order to demonstrate clinically significant improvement in cardiopulmonary endurance and community ambulation    Baseline 06/08/2021= 600 ft. in 5 min- Stopped due to fatigue using  4WW; 07/01/2021= 665 in 5 min 45 sec using 4WW; 7/5: 367 ft with QC; 09/29/2021= 740 feet using upright 4WW; 11/17/2021= 705 feet using upright 4WW; 12/15/2021= 680 feet with upright 4WW   Time 12   Period Weeks    Status On-going    Target Date 03/09/2022             Plan - 08/20/21 0939     Clinical Impression Statement Continuing PT POC with focus on LE strength, gait mechanics, and balance to optimize gait and reduced risk of falls. Patient able to perform well with resistance - able to clear foot with gait and all standing activities today. No evidence of gait shuffling or freezing during today's session.Patient's condition has the potential to improve in response to therapy. Maximum improvement is yet to be obtained. The anticipated improvement is attainable and reasonable in a generally  predictable time. Pt will continue to benefit from skilled PT services to reduce risk of falls and improve QoL.     Personal Factors and Comorbidities Comorbidity 3+    Comorbidities arthritis, bladder cancer, Trigeminal Neuralgia    Examination-Activity Limitations Caring for Others;Carry;Continence;Lift;Squat;Stairs;Stand    Examination-Participation Restrictions Community Activity;Yard Work    [employer] Evolving/Moderate complexity    Rehab Potential Good    PT Frequency 2x / week    PT Duration 12 weeks    PT Treatment/Interventions ADLs/Self Care Home Management;Cryotherapy;Canalith Repostioning;Electrical Stimulation;Moist Heat;DME Instruction;Gait training;Stair training;Functional mobility training;Therapeutic activities;Therapeutic exercise;Balance training;Neuromuscular re-education;Patient/family education;Manual techniques;Passive range of motion;Dry needling;Vestibular    PT Next Visit Plan LE strength, improve step length with ambulation, muscle tissue lengthening    PT Home Exercise Plan Access Code: [place], PT Physical Therapist- [facility]  12/29/21, 6:36 PM

## 2021-12-29 ENCOUNTER — Ambulatory Visit: Payer: Medicare HMO

## 2021-12-29 DIAGNOSIS — R2681 Unsteadiness on feet: Secondary | ICD-10-CM

## 2021-12-29 DIAGNOSIS — G8929 Other chronic pain: Secondary | ICD-10-CM

## 2021-12-29 DIAGNOSIS — R262 Difficulty in walking, not elsewhere classified: Secondary | ICD-10-CM

## 2021-12-29 DIAGNOSIS — R2689 Other abnormalities of gait and mobility: Secondary | ICD-10-CM

## 2021-12-29 DIAGNOSIS — R278 Other lack of coordination: Secondary | ICD-10-CM

## 2021-12-29 DIAGNOSIS — R269 Unspecified abnormalities of gait and mobility: Secondary | ICD-10-CM

## 2021-12-29 DIAGNOSIS — M6281 Muscle weakness (generalized): Secondary | ICD-10-CM

## 2021-12-31 ENCOUNTER — Ambulatory Visit: Payer: Medicare HMO | Attending: Neurology

## 2021-12-31 DIAGNOSIS — G8929 Other chronic pain: Secondary | ICD-10-CM | POA: Insufficient documentation

## 2021-12-31 DIAGNOSIS — M545 Low back pain, unspecified: Secondary | ICD-10-CM | POA: Insufficient documentation

## 2021-12-31 DIAGNOSIS — R2689 Other abnormalities of gait and mobility: Secondary | ICD-10-CM | POA: Insufficient documentation

## 2021-12-31 DIAGNOSIS — R278 Other lack of coordination: Secondary | ICD-10-CM | POA: Diagnosis present

## 2021-12-31 DIAGNOSIS — R262 Difficulty in walking, not elsewhere classified: Secondary | ICD-10-CM | POA: Insufficient documentation

## 2021-12-31 DIAGNOSIS — R2681 Unsteadiness on feet: Secondary | ICD-10-CM | POA: Diagnosis present

## 2021-12-31 DIAGNOSIS — M6281 Muscle weakness (generalized): Secondary | ICD-10-CM | POA: Insufficient documentation

## 2021-12-31 DIAGNOSIS — R269 Unspecified abnormalities of gait and mobility: Secondary | ICD-10-CM | POA: Diagnosis present

## 2021-12-31 NOTE — Therapy (Signed)
OUTPATIENT PHYSICAL THERAPY TREATMENT NOTE      Patient Name: [name] MRN: [id number] DOB:1945-05-25, 76 y.o., male Today's Date: 01/01/2022  PCP: [name]. [name], FNP REFERRING PROVIDER: Dr. Gurney Maxin   PT End of Session - 12/31/21 0942     Visit Number 71    Number of Visits 90    Date for PT Re-Evaluation 03/09/22    Authorization Type [employer]    Authorization Time Period 07/02/59-4/43/15; recert 40/09/6759- 11/04/930    Progress Note Due on Visit 80    PT Start Time 0930    PT Stop Time 1014    PT Time Calculation (min) 44 min    Equipment Utilized During Treatment Gait belt    Activity Tolerance Patient tolerated treatment well;No increased pain    Behavior During Therapy [facility] for tasks assessed/performed                        Past Medical History:  Diagnosis Date   Anxiety    Arteriosclerosis of coronary artery 11/16/2011   Overview:  Stent 10/2011 stent rca 2015 with collaterals to lad which is chronically occluded    Benign enlargement of prostate    Benign essential HTN 06/11/2014   Benign prostatic hypertrophy without urinary obstruction 07/31/2014   Bilateral cataracts 05/16/2013   Overview:  Dr. [facility]     Bone spur of foot    Left   BP (high blood pressure) 11/09/2012   Cancer ([place])    skin (forehead) and bladder   Carotid artery narrowing 02/08/2014   Depression    Detrusor hypertrophy    Diabetes ([place])    Diabetes mellitus, type 2 ([place]) 12/12/2012   Diverticulosis    Dyspnea    Esophageal reflux    Esophageal reflux    Fothergill's neuralgia 08/07/2012   Overview:  [facility]    Gastritis    GERD (gastroesophageal reflux disease)    Headache    cluster headaches   Healed myocardial infarct 11/09/2012   Hearing loss in left ear    Heart disease    Hematuria    Hemorrhoids    History of hiatal hernia 12/14/2017   small    Hypercholesteremia    Lesion of bladder    Myocardial infarct (HCC)     Presence of stent in coronary artery 11/09/2012   Rectal bleeding    Trigeminal neuralgia    Trigeminal neuralgia    Valvular heart disease    Vitamin D deficiency    Past Surgical History:  Procedure Laterality Date   APPENDECTOMY     BOTOX INJECTION N/A 12/21/2017   Procedure: Bladder BOTOX INJECTION;  Surgeon: [name], MD;  Location: [facility] ORS;  Service: Urology;  Laterality: N/A;   BOTOX INJECTION N/A 09/11/2018   Procedure: Bladder BOTOX INJECTION;  Surgeon: [name], MD;  Location: [facility] ORS;  Service: Urology;  Laterality: N/A;   CARDIAC CATHETERIZATION     CARDIAC CATHETERIZATION N/A 01/22/2015   Procedure: Left Heart Cath;  Surgeon: Corey Skains, MD;  Location: [place] CV LAB;  Service: Cardiovascular;  Laterality: N/A;   CARDIAC CATHETERIZATION N/A 01/22/2015   Procedure: Coronary Stent Intervention;  Surgeon: Isaias Cowman, MD;  Location: [facility] CV LAB;  Service: Cardiovascular;  Laterality: N/A;   CATARACT EXTRACTION, BILATERAL     COLONOSCOPY WITH PROPOFOL N/A 12/08/2015   Procedure: COLONOSCOPY WITH PROPOFOL;  Surgeon: Lollie Sails, MD;  Location: [facility] ENDOSCOPY;  Service: Endoscopy;  Laterality: N/A;   COLONOSCOPY WITH PROPOFOL N/A 12/09/2015   Procedure: COLONOSCOPY WITH PROPOFOL;  Surgeon: Lollie Sails, MD;  Location: [facility] ENDOSCOPY;  Service: Endoscopy;  Laterality: N/A;   CORONARY ANGIOPLASTY     5 stents   CORONARY STENT PLACEMENT  2015   x5   CYSTOSCOPY N/A 09/11/2018   Procedure: CYSTOSCOPY;  Surgeon: [name], MD;  Location: [facility] ORS;  Service: Urology;  Laterality: N/A;   CYSTOSCOPY WITH BIOPSY N/A 12/21/2017   Procedure: CYSTOSCOPY WITH BIOPSY;  Surgeon: [name], MD;  Location: [facility] ORS;  Service: Urology;  Laterality: N/A;   ESOPHAGOGASTRODUODENOSCOPY (EGD) WITH PROPOFOL N/A 12/08/2015   Procedure: ESOPHAGOGASTRODUODENOSCOPY (EGD) WITH PROPOFOL;  Surgeon: Lollie Sails, MD;  Location: [facility]  ENDOSCOPY;  Service: Endoscopy;  Laterality: N/A;   ESOPHAGOGASTRODUODENOSCOPY (EGD) WITH PROPOFOL N/A 12/13/2017   Procedure: ESOPHAGOGASTRODUODENOSCOPY (EGD) WITH PROPOFOL;  Surgeon: Lollie Sails, MD;  Location: [facility] ENDOSCOPY;  Service: Endoscopy;  Laterality: N/A;   EYE SURGERY     HERNIA REPAIR     kidney tumor remove     TRANSURETHRAL RESECTION OF BLADDER TUMOR WITH GYRUS (TURBT-GYRUS)  81/2751   UMBILICAL HERNIA REPAIR     urethral meatotomy     Patient Active Problem List   Diagnosis Date Noted   Class 2 obesity due to excess calories with body mass index (BMI) of 36.0 to 36.9 in adult 04/11/2020   Swelling of limb 03/28/2020   Lymphedema 03/28/2020   Trigeminal neuralgia 12/25/2019   Lower limb ulcer, calf, left, limited to breakdown of skin ([place]) 12/25/2019   OSA (obstructive sleep apnea) 70/03/7492   Acute systolic CHF (congestive heart failure) ([place]) 12/12/2018   Bruising 07/10/2018   Diet-controlled type 2 diabetes mellitus ([place]) 03/24/2018   Microalbuminuria 03/24/2018   Dizziness 11/17/2017   Simple chronic bronchitis ([place]) 09/22/2017   SOBOE (shortness of breath on exertion) 02/18/2017   Chronic midline low back pain without sciatica 12/29/2016   Periodic limb movement disorder 12/29/2016   Benign essential tremor 06/22/2016   Pure hypercholesterolemia 06/20/2015   Erectile dysfunction due to arterial insufficiency 06/16/2015   Unstable angina ([place]) 01/22/2015   Abdominal aortic 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 rupture ([place]) 12/31/2014   Benign prostatic hypertrophy without urinary obstruction 07/31/2014   Enlarged prostate 07/31/2014   Benign essential HTN 06/11/2014   Carotid artery narrowing 02/08/2014   Carotid artery obstruction 02/08/2014   Bilateral carotid artery stenosis 02/08/2014   Chest pain 08/20/2013   Bilateral cataracts 05/16/2013   Cataract 05/16/2013   Clinical depression 12/12/2012   Diabetes mellitus, type 2 ([place]) 12/12/2012   Combined fat  and carbohydrate induced hyperlipemia 12/12/2012   Major depressive disorder, single episode, unspecified 12/12/2012   Acid reflux 11/09/2012   Presence of stent in coronary artery 11/09/2012   BP (high blood pressure) 11/09/2012   Healed myocardial infarct 11/09/2012   Gastro-esophageal reflux disease without esophagitis 11/09/2012   History of cardiovascular surgery 11/09/2012   Fothergill's neuralgia 08/07/2012   Swelling of testicle 04/06/2012   Disorder of male genital organ 04/06/2012   Swelling of the testicles 04/06/2012   Arteriosclerosis of coronary artery 11/16/2011   CAD in native artery 11/16/2011   Fatigue 06/04/2011   Avitaminosis D 11/30/2010    REFERRING DIAG: Repeated falls   THERAPY DIAG:   Difficulty in walking, not elsewhere classified  Muscle weakness (generalized)  Other lack of coordination  Abnormality of gait and mobility  Other abnormalities of gait and mobility  Unsteadiness  on feet  Chronic bilateral low back pain without sciatica  Rationale for Evaluation and Treatment Rehabilitation   Subjective Assessment - 09/22/21 0936     Subjective  Pt reports moving around well this week and denies any pain or issues   Patient is accompained by: Family member -Wife   Pertinent History Patient is a 76 year old male with referral for Physical Therapy with diagnosis of Parkinsons disease and history of falling. He reports he diagnosed last year with Parkinsons and has improved his tremors with use of medication but reports worsening shuffling and difficulty with mobility. Patient has past medical history signifiant for Parkinsons; Arthritis, bladder cancer, Trigemic Neuralgia. He lives with wife in 1 level home and current using cane with reported recent fall.    Limitations Walking;Lifting;Standing;House hold activities    How long can you sit comfortably? No limits    How long can you stand comfortably? 15 min- limited due to back pain and foot numbess     How long can you walk comfortably? about 80 feet    Patient Stated Goals Improve my balance, be able to get up off the floor so I can maybe work in my garage again.    Currently in Pain? No/denies               PRECAUTIONS: Falls   PAIN:  No pain reported.     TODAY'S TREATMENT: 12/29/2021   There.ex:   Nu-Step: INTERVAL Training    Level 1   Level 2    Level 5   Level 2   Level 5  VC to keep SPM > 60with VC's.     Sit to stand x 12 without UE support.  Neuro Re-Ed:   Step up onto 6" block x 12 reps each LE with 4 # AW today  Side step up onto 6" block x 12 reps each LE   Backwards gait working on hip extension for posterior chain strengthening. X10 laps in // bars with 5# AW's.      Goals were just assessed 2 weeks ago- Did reassess gait speed today- Mild improvement since last assessed 2 weeks ago.      Gait with upright RW 320' with use of 5lb AW BLE exhibiting short reciprocal step through gait with min to mod VC's for step lengths, heel strike. No episodes of freezing with turning or walking through narrow spaces. CGA.          PATIENT EDUCATION: Education details: form/technique with exercise Person educated: Patient Education method: Explanation, Demonstration, Tactile cues, and Verbal cues Education comprehension: verbalized understanding, returned demonstration, verbal cues required, tactile cues required, and needs further education   HOME EXERCISE PROGRAM: No updates today   PT Short Term Goals -       PT SHORT TERM GOAL #1   Title Pt will be independent with INITIAL  HEP in order to improve strength and balance in order to decrease fall risk and improve function at home and work.    Baseline 04/07/2021= No formal HEP in place. 05/11/2021= Patient reports doing pretty good- compliant with walking program and his stretching/strengthening.    Time 6    Period Weeks    Status Achieved    Target Date 05/19/21      PT SHORT TERM GOAL #2   Title  Pt will decrease 5TSTS by at least 3 seconds in order to demonstrate clinically significant improvement in LE strength.    Baseline 04/07/2021= 25 sec;  05/11/2021= 18.65 sec    Time 6    Period Weeks    Status Achieved    Target Date 06/30/21              PT Long Term Goals -       PT LONG TERM GOAL #1   Title Pt will be independent with FINAL HEP in order to improve strength and balance in order to decrease fall risk and improve function at home and work.    Baseline 04/07/2021= No formal HEP in place. 07/01/2021=Patient reports performing walking and some standing LE strengthening exercises as instructed and states no questions at this time.  09/02/21: Pt reports he feels indep with exercises but is not performing HEP as much as he should; 11/17/2021= Patient reports compliant with current HEP- "I do them as best I can." 12/15/2021- Patient reports doing some exercises and walking but not feeling well in past week and denied performing HEP. Will keep goal active to ensure compliance and update HEP as appropriate.    Time 12    Period Weeks    Status Partially Met    Target Date 03/09/2022     PT LONG TERM GOAL #2   Title Pt will improve FOTO to target score of 45% to display perceived improvements in ability to complete ADL's.    Baseline 2/7/22023= 41%; FOTO=53%    Time 12    Period Weeks    Status Achieved    Target Date 06/30/21      PT LONG TERM GOAL #3   Title Pt will decrease 5TSTS by at least 5 seconds in order to demonstrate clinically significant improvement in LE strength.    Baseline 04/07/2021= 25 sec; 05/11/2021= 18.65 sec without UE support; 4/10= 13.76 sec without UE support    Time 12    Period Weeks    Status Achieved    Target Date 06/30/21      PT LONG TERM GOAL #4   Title Pt will decrease TUG to below 17 seconds/decrease in order to demonstrate decreased fall risk.    Baseline 04/07/2021= 21 sec with SPC; 05/11/2021= 18.85 sec with use of cane; 07/21/2021=18.88 sec  avg  with use of cane; 7/5: 22.85 sec with QC; 09/29/2021= 21.45 sec using upright walker; 11/17/2021= 20. 46 sec using upright walker; 10/17= 24 sec with upright walker   Time 12    Period Weeks    Status Partially Met    Target Date 03/09/2022     PT LONG TERM GOAL #5   Title Pt will increase 10MWT by at least 0.15 m/s in order to demonstrate clinically significant improvement in community ambulation.    Baseline 04/07/2021= 0.56 m/s using SPC; 05/11/2021= 0.54 m/s using SPC; 07/01/2021= 0.6 m/s/ 7/5: 0.48 m/s without AD but with CGA; 09/29/2021= 0.64 m/s; 11/17/2021= 0.61 m/s using upright 4WW; 10/17= 0.59 m/s; 12/29/2021= 0.64 m/s   Time 12    Period Weeks    Status On-going    Target Date 03/09/2022     PT LONG TERM GOAL #6   Title Pt will increase 6MWT by at least 69m(1669f in order to demonstrate clinically significant improvement in cardiopulmonary endurance and community ambulation    Baseline 06/08/2021= 600 ft. in 5 min- Stopped due to fatigue using 4WW; 07/01/2021= 665 in 5 min 45 sec using 4WW; 7/5: 367 ft with QC; 09/29/2021= 740 feet using upright 4WW; 11/17/2021= 705 feet using upright 4WW; 12/15/2021= 680 feet with upright 4WW  Time 12   Period Weeks    Status On-going    Target Date 03/09/2022             Plan - 08/20/21 0939     Clinical Impression Statement Continuing PT POC with focus on LE strength, gait mechanics, and balance to optimize gait and reduced risk of falls. Patient performed well with resistive hip flex with heel to toe sequencing and improved with ability to perform retro gait. Pt will continue to benefit from skilled PT services to reduce risk of falls and improve QoL.     Personal Factors and Comorbidities Comorbidity 3+    Comorbidities arthritis, bladder cancer, Trigeminal Neuralgia    Examination-Activity Limitations Caring for Others;Carry;Continence;Lift;Squat;Stairs;Stand    Examination-Participation Restrictions Community Activity;Yard Work     [employer] Evolving/Moderate complexity    Rehab Potential Good    PT Frequency 2x / week    PT Duration 12 weeks    PT Treatment/Interventions ADLs/Self Care Home Management;Cryotherapy;Canalith Repostioning;Electrical Stimulation;Moist Heat;DME Instruction;Gait training;Stair training;Functional mobility training;Therapeutic activities;Therapeutic exercise;Balance training;Neuromuscular re-education;Patient/family education;Manual techniques;Passive range of motion;Dry needling;Vestibular    PT Next Visit Plan LE strength, improve step length with ambulation, muscle tissue lengthening    PT Home Exercise Plan Access Code: Verdel Oluwatoni Rotunno, PT Physical Therapist- [facility]  01/01/22, 2:46 PM

## 2022-01-05 ENCOUNTER — Ambulatory Visit: Payer: Medicare HMO

## 2022-01-05 DIAGNOSIS — R262 Difficulty in walking, not elsewhere classified: Secondary | ICD-10-CM

## 2022-01-05 DIAGNOSIS — R2681 Unsteadiness on feet: Secondary | ICD-10-CM

## 2022-01-05 DIAGNOSIS — M6281 Muscle weakness (generalized): Secondary | ICD-10-CM

## 2022-01-05 DIAGNOSIS — R269 Unspecified abnormalities of gait and mobility: Secondary | ICD-10-CM

## 2022-01-05 DIAGNOSIS — R2689 Other abnormalities of gait and mobility: Secondary | ICD-10-CM

## 2022-01-05 DIAGNOSIS — R278 Other lack of coordination: Secondary | ICD-10-CM

## 2022-01-05 DIAGNOSIS — G8929 Other chronic pain: Secondary | ICD-10-CM

## 2022-01-05 NOTE — Therapy (Signed)
OUTPATIENT PHYSICAL THERAPY TREATMENT NOTE      Patient Name: [name] MRN: [id number] DOB:06-28-1945, 76 y.o., male Today's Date: 01/08/2022  PCP: [name]. [name], [place] PROVIDER: Dr. Gurney Maxin   PT End of Session - 01/08/22 0817     Visit Number 72    Number of Visits 39    Date for PT Re-Evaluation 03/09/22    Authorization Type [employer]    Authorization Time Period 4/0/97-3/53/29; recert 92/42/6834- 03/09/6220    Progress Note Due on Visit 80    PT Start Time 0930    PT Stop Time 1013    PT Time Calculation (min) 43 min    Equipment Utilized During Treatment Gait belt    Activity Tolerance Patient tolerated treatment well;No increased pain    Behavior During Therapy [facility] for tasks assessed/performed                          Past Medical History:  Diagnosis Date   Anxiety    Arteriosclerosis of coronary artery 11/16/2011   Overview:  Stent 10/2011 stent rca 2015 with collaterals to lad which is chronically occluded    Benign enlargement of prostate    Benign essential HTN 06/11/2014   Benign prostatic hypertrophy without urinary obstruction 07/31/2014   Bilateral cataracts 05/16/2013   Overview:  Dr. [facility]     Bone spur of foot    Left   BP (high blood pressure) 11/09/2012   Cancer ([place])    skin (forehead) and bladder   Carotid artery narrowing 02/08/2014   Depression    Detrusor hypertrophy    Diabetes ([place])    Diabetes mellitus, type 2 ([place]) 12/12/2012   Diverticulosis    Dyspnea    Esophageal reflux    Esophageal reflux    Fothergill's neuralgia 08/07/2012   Overview:  [facility]    Gastritis    GERD (gastroesophageal reflux disease)    Headache    cluster headaches   Healed myocardial infarct 11/09/2012   Hearing loss in left ear    Heart disease    Hematuria    Hemorrhoids    History of hiatal hernia 12/14/2017   small    Hypercholesteremia    Lesion of bladder    Myocardial infarct (HCC)     Presence of stent in coronary artery 11/09/2012   Rectal bleeding    Trigeminal neuralgia    Trigeminal neuralgia    Valvular heart disease    Vitamin D deficiency    Past Surgical History:  Procedure Laterality Date   APPENDECTOMY     BOTOX INJECTION N/A 12/21/2017   Procedure: Bladder BOTOX INJECTION;  Surgeon: [name], MD;  Location: [facility] ORS;  Service: Urology;  Laterality: N/A;   BOTOX INJECTION N/A 09/11/2018   Procedure: Bladder BOTOX INJECTION;  Surgeon: [name], MD;  Location: [facility] ORS;  Service: Urology;  Laterality: N/A;   CARDIAC CATHETERIZATION     CARDIAC CATHETERIZATION N/A 01/22/2015   Procedure: Left Heart Cath;  Surgeon: Corey Skains, MD;  Location: [place] CV [facility];  Service: Cardiovascular;  Laterality: N/A;   CARDIAC CATHETERIZATION N/A 01/22/2015   Procedure: Coronary Stent Intervention;  Surgeon: Isaias Cowman, MD;  Location: [place] [facility];  Service: Cardiovascular;  Laterality: N/A;   CATARACT EXTRACTION, BILATERAL     COLONOSCOPY WITH PROPOFOL N/A 12/08/2015   Procedure: COLONOSCOPY WITH PROPOFOL;  Surgeon: Lollie Sails, MD;  Location: [facility]  ENDOSCOPY;  Service: Endoscopy;  Laterality: N/A;   COLONOSCOPY WITH PROPOFOL N/A 12/09/2015   Procedure: COLONOSCOPY WITH PROPOFOL;  Surgeon: Lollie Sails, MD;  Location: [facility] ENDOSCOPY;  Service: Endoscopy;  Laterality: N/A;   CORONARY ANGIOPLASTY     5 stents   CORONARY STENT PLACEMENT  2015   x5   CYSTOSCOPY N/A 09/11/2018   Procedure: CYSTOSCOPY;  Surgeon: [name], MD;  Location: [facility] ORS;  Service: Urology;  Laterality: N/A;   CYSTOSCOPY WITH BIOPSY N/A 12/21/2017   Procedure: CYSTOSCOPY WITH BIOPSY;  Surgeon: [name], MD;  Location: [facility] ORS;  Service: Urology;  Laterality: N/A;   ESOPHAGOGASTRODUODENOSCOPY (EGD) WITH PROPOFOL N/A 12/08/2015   Procedure: ESOPHAGOGASTRODUODENOSCOPY (EGD) WITH PROPOFOL;  Surgeon: Lollie Sails, MD;  Location: [facility]  ENDOSCOPY;  Service: Endoscopy;  Laterality: N/A;   ESOPHAGOGASTRODUODENOSCOPY (EGD) WITH PROPOFOL N/A 12/13/2017   Procedure: ESOPHAGOGASTRODUODENOSCOPY (EGD) WITH PROPOFOL;  Surgeon: Lollie Sails, MD;  Location: [facility] ENDOSCOPY;  Service: Endoscopy;  Laterality: N/A;   EYE SURGERY     HERNIA REPAIR     kidney tumor remove     TRANSURETHRAL RESECTION OF BLADDER TUMOR WITH GYRUS (TURBT-GYRUS)  29/5188   UMBILICAL HERNIA REPAIR     urethral meatotomy     Patient Active Problem List   Diagnosis Date Noted   Class 2 obesity due to excess calories with body mass index (BMI) of 36.0 to 36.9 in adult 04/11/2020   Swelling of limb 03/28/2020   Lymphedema 03/28/2020   Trigeminal neuralgia 12/25/2019   Lower limb ulcer, calf, left, limited to breakdown of skin ([place]) 12/25/2019   OSA (obstructive sleep apnea) 41/66/0630   Acute systolic CHF (congestive heart failure) ([place]) 12/12/2018   Bruising 07/10/2018   Diet-controlled type 2 diabetes mellitus ([place]) 03/24/2018   Microalbuminuria 03/24/2018   Dizziness 11/17/2017   Simple chronic bronchitis ([place]) 09/22/2017   SOBOE (shortness of breath on exertion) 02/18/2017   Chronic midline low back pain without sciatica 12/29/2016   Periodic limb movement disorder 12/29/2016   Benign essential tremor 06/22/2016   Pure hypercholesterolemia 06/20/2015   Erectile dysfunction due to arterial insufficiency 06/16/2015   Unstable angina ([place]) 01/22/2015   Abdominal aortic 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 rupture ([place]) 12/31/2014   Benign prostatic hypertrophy without urinary obstruction 07/31/2014   Enlarged prostate 07/31/2014   Benign essential HTN 06/11/2014   Carotid artery narrowing 02/08/2014   Carotid artery obstruction 02/08/2014   Bilateral carotid artery stenosis 02/08/2014   Chest pain 08/20/2013   Bilateral cataracts 05/16/2013   Cataract 05/16/2013   Clinical depression 12/12/2012   Diabetes mellitus, type 2 ([place]) 12/12/2012   Combined fat  and carbohydrate induced hyperlipemia 12/12/2012   Major depressive disorder, single episode, unspecified 12/12/2012   Acid reflux 11/09/2012   Presence of stent in coronary artery 11/09/2012   BP (high blood pressure) 11/09/2012   Healed myocardial infarct 11/09/2012   Gastro-esophageal reflux disease without esophagitis 11/09/2012   History of cardiovascular surgery 11/09/2012   Fothergill's neuralgia 08/07/2012   Swelling of testicle 04/06/2012   Disorder of male genital organ 04/06/2012   Swelling of the testicles 04/06/2012   Arteriosclerosis of coronary artery 11/16/2011   CAD in native artery 11/16/2011   Fatigue 06/04/2011   Avitaminosis D 11/30/2010    REFERRING DIAG: Repeated falls   THERAPY DIAG:   Difficulty in walking, not elsewhere classified  Muscle weakness (generalized)  Other lack of coordination  Abnormality of gait and mobility  Other abnormalities of gait and mobility  Unsteadiness on feet  Chronic bilateral low back pain without sciatica  Rationale for Evaluation and Treatment Rehabilitation   Subjective Assessment - 09/22/21 0936     Subjective  Pt reports moving slow yet no falls and no pain today.   Patient is accompained by: Family member -Wife   Pertinent History Patient is a 76 year old male with referral for Physical Therapy with diagnosis of Parkinsons disease and history of falling. He reports he diagnosed last year with Parkinsons and has improved his tremors with use of medication but reports worsening shuffling and difficulty with mobility. Patient has past medical history signifiant for Parkinsons; Arthritis, bladder cancer, Trigemic Neuralgia. He lives with wife in 1 level home and current using cane with reported recent fall.    Limitations Walking;Lifting;Standing;House hold activities    How long can you sit comfortably? No limits    How long can you stand comfortably? 15 min- limited due to back pain and foot numbess    How long can  you walk comfortably? about 80 feet    Patient Stated Goals Improve my balance, be able to get up off the floor so I can maybe work in my garage again.    Currently in Pain? No/denies               PRECAUTIONS: Falls   PAIN:  No pain reported.     TODAY'S TREATMENT: 01/05/2022   There.ex:   Seated LAQ 4# 2 sets of 12 reps  Seated Hip flex 4# 2 sets of 12 reps Sit to stand into UE raise overhead x 12 reps (no pain reported)  Standing Calf raises - 2 sets 12 reps    Neuro Re-Ed:   Backwards gait working on hip extension for posterior chain strengthening. X10 laps in // bars with 4# AW's.      Gait with upright RW 320' with use of 4lb AW BLE exhibiting short reciprocal step through gait with min to mod VC's for step lengths, heel strike. No episodes of freezing with turning or walking through narrow spaces. CGA.     Standing- Lateral step to side - with same side diagonal reaching overhead and tap wall- repeat opp side x 12 reps (patient with some difficulty - VC to only take 1 large side step rather than shufflle step.    Standing- Forward lunge step with trunk rotation- arms spread wide then return to static stand- x 12 reps each LE         PATIENT EDUCATION: Education details: form/technique with exercise Person educated: Patient Education method: Explanation, Demonstration, Tactile cues, and Verbal cues Education comprehension: verbalized understanding, returned demonstration, verbal cues required, tactile cues required, and needs further education   HOME EXERCISE PROGRAM: No updates today   PT Short Term Goals -       PT SHORT TERM GOAL #1   Title Pt will be independent with INITIAL  HEP in order to improve strength and balance in order to decrease fall risk and improve function at home and work.    Baseline 04/07/2021= No formal HEP in place. 05/11/2021= Patient reports doing pretty good- compliant with walking program and his stretching/strengthening.    Time  6    Period Weeks    Status Achieved    Target Date 05/19/21      PT SHORT TERM GOAL #2   Title Pt will decrease 5TSTS by at least 3 seconds in order to demonstrate clinically significant improvement in LE strength.  Baseline 04/07/2021= 25 sec; 05/11/2021= 18.65 sec    Time 6    Period Weeks    Status Achieved    Target Date 06/30/21              PT Long Term Goals -       PT LONG TERM GOAL #1   Title Pt will be independent with FINAL HEP in order to improve strength and balance in order to decrease fall risk and improve function at home and work.    Baseline 04/07/2021= No formal HEP in place. 07/01/2021=Patient reports performing walking and some standing LE strengthening exercises as instructed and states no questions at this time.  09/02/21: Pt reports he feels indep with exercises but is not performing HEP as much as he should; 11/17/2021= Patient reports compliant with current HEP- "I do them as best I can." 12/15/2021- Patient reports doing some exercises and walking but not feeling well in past week and denied performing HEP. Will keep goal active to ensure compliance and update HEP as appropriate.    Time 12    Period Weeks    Status Partially Met    Target Date 03/09/2022     PT LONG TERM GOAL #2   Title Pt will improve FOTO to target score of 45% to display perceived improvements in ability to complete ADL's.    Baseline 2/7/22023= 41%; FOTO=53%    Time 12    Period Weeks    Status Achieved    Target Date 06/30/21      PT LONG TERM GOAL #3   Title Pt will decrease 5TSTS by at least 5 seconds in order to demonstrate clinically significant improvement in LE strength.    Baseline 04/07/2021= 25 sec; 05/11/2021= 18.65 sec without UE support; 4/10= 13.76 sec without UE support    Time 12    Period Weeks    Status Achieved    Target Date 06/30/21      PT LONG TERM GOAL #4   Title Pt will decrease TUG to below 17 seconds/decrease in order to demonstrate decreased fall risk.     Baseline 04/07/2021= 21 sec with SPC; 05/11/2021= 18.85 sec with use of cane; 07/21/2021=18.88 sec  avg with use of cane; 7/5: 22.85 sec with QC; 09/29/2021= 21.45 sec using upright walker; 11/17/2021= 20. 46 sec using upright walker; 10/17= 24 sec with upright walker   Time 12    Period Weeks    Status Partially Met    Target Date 03/09/2022     PT LONG TERM GOAL #5   Title Pt will increase 10MWT by at least 0.15 m/s in order to demonstrate clinically significant improvement in community ambulation.    Baseline 04/07/2021= 0.56 m/s using SPC; 05/11/2021= 0.54 m/s using SPC; 07/01/2021= 0.6 m/s/ 7/5: 0.48 m/s without AD but with CGA; 09/29/2021= 0.64 m/s; 11/17/2021= 0.61 m/s using upright 4WW; 10/17= 0.59 m/s; 12/29/2021= 0.64 m/s   Time 12    Period Weeks    Status On-going    Target Date 03/09/2022     PT LONG TERM GOAL #6   Title Pt will increase 6MWT by at least 36m(1660f in order to demonstrate clinically significant improvement in cardiopulmonary endurance and community ambulation    Baseline 06/08/2021= 600 ft. in 5 min- Stopped due to fatigue using 4WW; 07/01/2021= 665 in 5 min 45 sec using 4WW; 7/5: 367 ft with QC; 09/29/2021= 740 feet using upright 4WW; 11/17/2021= 705 feet using upright 4WW; 12/15/2021= 680 feet  with upright 4WW   Time 12   Period Weeks    Status On-going    Target Date 03/09/2022             Plan - 08/20/21 0939     Clinical Impression Statement Continuing PT POC with focus on LE strength, gait mechanics, and balance to optimize gait and reduced risk of falls. Patient presents with more stiffness initially yet able to adapt to prescribed activities and reach higher/larger steps without shuffling with practice. HE performed well with walking with resistance with only VC to maintain step length as he would go through doorways.  Pt will continue to benefit from skilled PT services to reduce risk of falls and improve QoL.     Personal Factors and Comorbidities Comorbidity 3+     Comorbidities arthritis, bladder cancer, Trigeminal Neuralgia    Examination-Activity Limitations Caring for Others;Carry;Continence;Lift;Squat;Stairs;Stand    Examination-Participation Restrictions Community Activity;Yard Work    [employer] Evolving/Moderate complexity    Rehab Potential Good    PT Frequency 2x / week    PT Duration 12 weeks    PT Treatment/Interventions ADLs/Self Care Home Management;Cryotherapy;Canalith Repostioning;Electrical Stimulation;Moist Heat;DME Instruction;Gait training;Stair training;Functional mobility training;Therapeutic activities;Therapeutic exercise;Balance training;Neuromuscular re-education;Patient/family education;Manual techniques;Passive range of motion;Dry needling;Vestibular    PT Next Visit Plan LE strength, improve step length with ambulation, muscle tissue lengthening    PT Home Exercise Plan Access Code: [email or web address], PT Physical Therapist- [facility]  01/08/22, 8:18 AM

## 2022-01-07 ENCOUNTER — Ambulatory Visit: Payer: Medicare HMO

## 2022-01-08 ENCOUNTER — Ambulatory Visit: Payer: Medicare HMO

## 2022-01-08 DIAGNOSIS — R269 Unspecified abnormalities of gait and mobility: Secondary | ICD-10-CM

## 2022-01-08 DIAGNOSIS — R278 Other lack of coordination: Secondary | ICD-10-CM

## 2022-01-08 DIAGNOSIS — R2681 Unsteadiness on feet: Secondary | ICD-10-CM

## 2022-01-08 DIAGNOSIS — M6281 Muscle weakness (generalized): Secondary | ICD-10-CM

## 2022-01-08 DIAGNOSIS — R2689 Other abnormalities of gait and mobility: Secondary | ICD-10-CM

## 2022-01-08 DIAGNOSIS — R262 Difficulty in walking, not elsewhere classified: Secondary | ICD-10-CM | POA: Diagnosis not present

## 2022-01-08 DIAGNOSIS — M545 Low back pain, unspecified: Secondary | ICD-10-CM

## 2022-01-08 NOTE — Therapy (Signed)
OUTPATIENT PHYSICAL THERAPY TREATMENT NOTE      Patient Name: [name] MRN: [id number] DOB:December 04, 1945, 76 y.o., male Today's Date: 01/08/2022  PCP: [name]. [name], [place] PROVIDER: Dr. Gurney Maxin   PT End of Session - 01/08/22 0849     Visit Number 58    Number of Visits 56    Date for PT Re-Evaluation 03/09/22    Authorization Type [employer]    Authorization Time Period 4/0/10-2/72/53; recert 66/44/0347- 06/01/5954    Progress Note Due on Visit 80    PT Start Time 0848    PT Stop Time 0930    PT Time Calculation (min) 42 min    Equipment Utilized During Treatment Gait belt    Activity Tolerance Patient tolerated treatment well;No increased pain    Behavior During Therapy [facility] for tasks assessed/performed              Past Medical History:  Diagnosis Date   Anxiety    Arteriosclerosis of coronary artery 11/16/2011   Overview:  Stent 10/2011 stent rca 2015 with collaterals to lad which is chronically occluded    Benign enlargement of prostate    Benign essential HTN 06/11/2014   Benign prostatic hypertrophy without urinary obstruction 07/31/2014   Bilateral cataracts 05/16/2013   Overview:  Dr. [facility]     Bone spur of foot    Left   BP (high blood pressure) 11/09/2012   Cancer ([place])    skin (forehead) and bladder   Carotid artery narrowing 02/08/2014   Depression    Detrusor hypertrophy    Diabetes ([place])    Diabetes mellitus, type 2 ([place]) 12/12/2012   Diverticulosis    Dyspnea    Esophageal reflux    Esophageal reflux    Fothergill's neuralgia 08/07/2012   Overview:  [facility]    Gastritis    GERD (gastroesophageal reflux disease)    Headache    cluster headaches   Healed myocardial infarct 11/09/2012   Hearing loss in left ear    Heart disease    Hematuria    Hemorrhoids    History of hiatal hernia 12/14/2017   small    Hypercholesteremia    Lesion of bladder    Myocardial infarct (HCC)    Presence of stent in  coronary artery 11/09/2012   Rectal bleeding    Trigeminal neuralgia    Trigeminal neuralgia    Valvular heart disease    Vitamin D deficiency    Past Surgical History:  Procedure Laterality Date   APPENDECTOMY     BOTOX INJECTION N/A 12/21/2017   Procedure: Bladder BOTOX INJECTION;  Surgeon: [name], MD;  Location: [facility] ORS;  Service: Urology;  Laterality: N/A;   BOTOX INJECTION N/A 09/11/2018   Procedure: Bladder BOTOX INJECTION;  Surgeon: [name], MD;  Location: [facility];  Service: Urology;  Laterality: N/A;   CARDIAC CATHETERIZATION     CARDIAC CATHETERIZATION N/A 01/22/2015   Procedure: Left Heart Cath;  Surgeon: Corey Skains, MD;  Location: [facility];  Service: Cardiovascular;  Laterality: N/A;   CARDIAC CATHETERIZATION N/A 01/22/2015   Procedure: Coronary Stent Intervention;  Surgeon: Isaias Cowman, MD;  Location: [place] [facility];  Service: Cardiovascular;  Laterality: N/A;   CATARACT EXTRACTION, BILATERAL     COLONOSCOPY WITH PROPOFOL N/A 12/08/2015   Procedure: COLONOSCOPY WITH PROPOFOL;  Surgeon: Lollie Sails, MD;  Location: [facility] ENDOSCOPY;  Service: Endoscopy;  Laterality: N/A;   COLONOSCOPY WITH PROPOFOL  N/A 12/09/2015   Procedure: COLONOSCOPY WITH PROPOFOL;  Surgeon: Lollie Sails, MD;  Location: [facility] ENDOSCOPY;  Service: Endoscopy;  Laterality: N/A;   CORONARY ANGIOPLASTY     5 stents   CORONARY STENT PLACEMENT  2015   x5   CYSTOSCOPY N/A 09/11/2018   Procedure: CYSTOSCOPY;  Surgeon: [name], MD;  Location: [facility];  Service: Urology;  Laterality: N/A;   CYSTOSCOPY WITH BIOPSY N/A 12/21/2017   Procedure: CYSTOSCOPY WITH BIOPSY;  Surgeon: [name], MD;  Location: [facility];  Service: Urology;  Laterality: N/A;   ESOPHAGOGASTRODUODENOSCOPY (EGD) WITH PROPOFOL N/A 12/08/2015   Procedure: ESOPHAGOGASTRODUODENOSCOPY (EGD) WITH PROPOFOL;  Surgeon: Lollie Sails, MD;  Location: [facility]COPY;  Service:  Endoscopy;  Laterality: N/A;   ESOPHAGOGASTRODUODENOSCOPY (EGD) WITH PROPOFOL N/A 12/13/2017   Procedure: ESOPHAGOGASTRODUODENOSCOPY (EGD) WITH PROPOFOL;  Surgeon: Lollie Sails, MD;  Location: [facility] ENDOSCOPY;  Service: Endoscopy;  Laterality: N/A;   EYE SURGERY     HERNIA REPAIR     kidney tumor remove     TRANSURETHRAL RESECTION OF BLADDER TUMOR WITH GYRUS (TURBT-GYRUS)  01/4579   UMBILICAL HERNIA REPAIR     urethral meatotomy     Patient Active Problem List   Diagnosis Date Noted   Class 2 obesity due to excess calories with body mass index (BMI) of 36.0 to 36.9 in adult 04/11/2020   Swelling of limb 03/28/2020   Lymphedema 03/28/2020   Trigeminal neuralgia 12/25/2019   Lower limb ulcer, calf, left, limited to breakdown of skin ([place]) 12/25/2019   OSA (obstructive sleep apnea) 99/83/3825   Acute systolic CHF (congestive heart failure) ([place]) 12/12/2018   Bruising 07/10/2018   Diet-controlled type 2 diabetes mellitus ([place]) 03/24/2018   Microalbuminuria 03/24/2018   Dizziness 11/17/2017   Simple chronic bronchitis ([place]) 09/22/2017   SOBOE (shortness of breath on exertion) 02/18/2017   Chronic midline low back pain without sciatica 12/29/2016   Periodic limb movement disorder 12/29/2016   Benign essential tremor 06/22/2016   Pure hypercholesterolemia 06/20/2015   Erectile dysfunction due to arterial insufficiency 06/16/2015   Unstable angina ([place]) 01/22/2015   Abdominal aortic 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 rupture ([place]) 12/31/2014   Benign prostatic hypertrophy without urinary obstruction 07/31/2014   Enlarged prostate 07/31/2014   Benign essential HTN 06/11/2014   Carotid artery narrowing 02/08/2014   Carotid artery obstruction 02/08/2014   Bilateral carotid artery stenosis 02/08/2014   Chest pain 08/20/2013   Bilateral cataracts 05/16/2013   Cataract 05/16/2013   Clinical depression 12/12/2012   Diabetes mellitus, type 2 ([place]) 12/12/2012   Combined fat and carbohydrate  induced hyperlipemia 12/12/2012   Major depressive disorder, single episode, unspecified 12/12/2012   Acid reflux 11/09/2012   Presence of stent in coronary artery 11/09/2012   BP (high blood pressure) 11/09/2012   Healed myocardial infarct 11/09/2012   Gastro-esophageal reflux disease without esophagitis 11/09/2012   History of cardiovascular surgery 11/09/2012   Fothergill's neuralgia 08/07/2012   Swelling of testicle 04/06/2012   Disorder of male genital organ 04/06/2012   Swelling of the testicles 04/06/2012   Arteriosclerosis of coronary artery 11/16/2011   CAD in native artery 11/16/2011   Fatigue 06/04/2011   Avitaminosis D 11/30/2010    REFERRING DIAG: Repeated falls   THERAPY DIAG:   Difficulty in walking, not elsewhere classified  Muscle weakness (generalized)  Other lack of coordination  Abnormality of gait and mobility  Other abnormalities of gait and mobility  Unsteadiness on feet  Chronic bilateral low back pain without sciatica  Rationale for Evaluation and Treatment Rehabilitation   Subjective Assessment - 09/22/21 0936     Subjective Pt reports he hasn't been able to walk real well this morning.  Pt reports the MD appointment went well yesterday and states his A1C is in good shape and he doesn't really have diabetes.   Patient is accompained by: Family member -Wife   Pertinent History Patient is a 76 year old male with referral for Physical Therapy with diagnosis of Parkinsons disease and history of falling. He reports he diagnosed last year with Parkinsons and has improved his tremors with use of medication but reports worsening shuffling and difficulty with mobility. Patient has past medical history signifiant for Parkinsons; Arthritis, bladder cancer, Trigemic Neuralgia. He lives with wife in 1 level home and current using cane with reported recent fall.    Limitations Walking;Lifting;Standing;House hold activities    How long can you sit comfortably? No  limits    How long can you stand comfortably? 15 min- limited due to back pain and foot numbess    How long can you walk comfortably? about 80 feet    Patient Stated Goals Improve my balance, be able to get up off the floor so I can maybe work in my garage again.    Currently in Pain? No/denies               PRECAUTIONS: Falls   PAIN:   No pain reported.     TODAY'S TREATMENT: 01/05/2022  BP: 135/71 HR: 69  There.ex:   NuStep Level 2 for 5 min for improved endurance training  Ambulation around the gym with 4# AW donned, x3 laps total  Pt with increased difficulty performing specifically with veering to the L and running into walls/items on the L side.  Seated LAQ 4# 2 sets of 12 reps  Seated Hip flex 4# 2 sets of 12 reps Sit to stand into UE raise overhead with 2.2 yellow weighted ball, 2x10 reps  Standing Calf raises, 2x10 Standing hamstring curls, 4# AW donned, 2x10 each LE        PATIENT EDUCATION: Education details: form/technique with exercise Person educated: Patient Education method: Explanation, Demonstration, Tactile cues, and Verbal cues Education comprehension: verbalized understanding, returned demonstration, verbal cues required, tactile cues required, and needs further education   HOME EXERCISE PROGRAM: No updates today   PT Short Term Goals -       PT SHORT TERM GOAL #1   Title Pt will be independent with INITIAL  HEP in order to improve strength and balance in order to decrease fall risk and improve function at home and work.    Baseline 04/07/2021= No formal HEP in place. 05/11/2021= Patient reports doing pretty good- compliant with walking program and his stretching/strengthening.    Time 6    Period Weeks    Status Achieved    Target Date 05/19/21      PT SHORT TERM GOAL #2   Title Pt will decrease 5TSTS by at least 3 seconds in order to demonstrate clinically significant improvement in LE strength.    Baseline 04/07/2021= 25 sec;  05/11/2021= 18.65 sec    Time 6    Period Weeks    Status Achieved    Target Date 06/30/21              PT Long Term Goals -       PT LONG TERM GOAL #1   Title Pt will be independent with FINAL HEP in  order to improve strength and balance in order to decrease fall risk and improve function at home and work.    Baseline 04/07/2021= No formal HEP in place. 07/01/2021=Patient reports performing walking and some standing LE strengthening exercises as instructed and states no questions at this time.  09/02/21: Pt reports he feels indep with exercises but is not performing HEP as much as he should; 11/17/2021= Patient reports compliant with current HEP- "I do them as best I can." 12/15/2021- Patient reports doing some exercises and walking but not feeling well in past week and denied performing HEP. Will keep goal active to ensure compliance and update HEP as appropriate.    Time 12    Period Weeks    Status Partially Met    Target Date 03/09/2022     PT LONG TERM GOAL #2   Title Pt will improve FOTO to target score of 45% to display perceived improvements in ability to complete ADL's.    Baseline 2/7/22023= 41%; FOTO=53%    Time 12    Period Weeks    Status Achieved    Target Date 06/30/21      PT LONG TERM GOAL #3   Title Pt will decrease 5TSTS by at least 5 seconds in order to demonstrate clinically significant improvement in LE strength.    Baseline 04/07/2021= 25 sec; 05/11/2021= 18.65 sec without UE support; 4/10= 13.76 sec without UE support    Time 12    Period Weeks    Status Achieved    Target Date 06/30/21      PT LONG TERM GOAL #4   Title Pt will decrease TUG to below 17 seconds/decrease in order to demonstrate decreased fall risk.    Baseline 04/07/2021= 21 sec with SPC; 05/11/2021= 18.85 sec with use of cane; 07/21/2021=18.88 sec  avg with use of cane; 7/5: 22.85 sec with QC; 09/29/2021= 21.45 sec using upright walker; 11/17/2021= 20. 46 sec using upright walker; 10/17= 24 sec with  upright walker   Time 12    Period Weeks    Status Partially Met    Target Date 03/09/2022     PT LONG TERM GOAL #5   Title Pt will increase 10MWT by at least 0.15 m/s in order to demonstrate clinically significant improvement in community ambulation.    Baseline 04/07/2021= 0.56 m/s using SPC; 05/11/2021= 0.54 m/s using SPC; 07/01/2021= 0.6 m/s/ 7/5: 0.48 m/s without AD but with CGA; 09/29/2021= 0.64 m/s; 11/17/2021= 0.61 m/s using upright 4WW; 10/17= 0.59 m/s; 12/29/2021= 0.64 m/s   Time 12    Period Weeks    Status On-going    Target Date 03/09/2022     PT LONG TERM GOAL #6   Title Pt will increase 6MWT by at least 95m(1630f in order to demonstrate clinically significant improvement in cardiopulmonary endurance and community ambulation    Baseline 06/08/2021= 600 ft. in 5 min- Stopped due to fatigue using 4WW; 07/01/2021= 665 in 5 min 45 sec using 4WW; 7/5: 367 ft with QC; 09/29/2021= 740 feet using upright 4WW; 11/17/2021= 705 feet using upright 4WW; 12/15/2021= 680 feet with upright 4WW   Time 12   Period Weeks    Status On-going    Target Date 03/09/2022              Plan - 08/20/21 0939     Clinical Impression Statement Pt with significant freezing of gait pattern throughout the treatment session today, specifically with ambulation with use of the AW's.  Pt  notes he has been like this for the past 24-48 hours and is unsure as to why, just chalking it up to having a bad day.  Pt still put forth great effort, just needed additional cuing for proper technique with ambulation and exercises.  Pt will continue to benefit from skilled therapy to address remaining deficits in order to improve overall QoL and return to PLOF.        Personal Factors and Comorbidities Comorbidity 3+    Comorbidities arthritis, bladder cancer, Trigeminal Neuralgia    Examination-Activity Limitations Caring for Others;Carry;Continence;Lift;Squat;Stairs;Stand    Examination-Participation Restrictions Community  Activity;Yard Work    [employer] Evolving/Moderate complexity    Rehab Potential Good    PT Frequency 2x / week    PT Duration 12 weeks    PT Treatment/Interventions ADLs/Self Care Home Management;Cryotherapy;Canalith Repostioning;Electrical Stimulation;Moist Heat;DME Instruction;Gait training;Stair training;Functional mobility training;Therapeutic activities;Therapeutic exercise;Balance training;Neuromuscular re-education;Patient/family education;Manual techniques;Passive range of motion;Dry needling;Vestibular    PT Next Visit Plan LE strength, improve step length with ambulation, muscle tissue lengthening    PT Home Exercise Plan Access Code: [email or web address]             Gwenlyn Saran, PT, DPT Physical Therapist- [facility]  01/08/22, 11:10 AM

## 2022-01-12 ENCOUNTER — Ambulatory Visit: Payer: Medicare HMO

## 2022-01-12 DIAGNOSIS — R262 Difficulty in walking, not elsewhere classified: Secondary | ICD-10-CM | POA: Diagnosis not present

## 2022-01-12 DIAGNOSIS — M6281 Muscle weakness (generalized): Secondary | ICD-10-CM

## 2022-01-12 DIAGNOSIS — R2681 Unsteadiness on feet: Secondary | ICD-10-CM

## 2022-01-12 DIAGNOSIS — R2689 Other abnormalities of gait and mobility: Secondary | ICD-10-CM

## 2022-01-12 DIAGNOSIS — R269 Unspecified abnormalities of gait and mobility: Secondary | ICD-10-CM

## 2022-01-12 DIAGNOSIS — G8929 Other chronic pain: Secondary | ICD-10-CM

## 2022-01-12 DIAGNOSIS — R278 Other lack of coordination: Secondary | ICD-10-CM

## 2022-01-12 NOTE — Therapy (Signed)
OUTPATIENT PHYSICAL THERAPY TREATMENT NOTE      Patient Name: [name] MRN: [id number] DOB:Aug 28, 1945, 76 y.o., male Today's Date: 01/12/2022  PCP: [name]. [name], FNP REFERRING PROVIDER: Dr. Gurney Maxin   PT End of Session - 01/12/22 0941     Visit Number 61    Number of Visits 51    Date for PT Re-Evaluation 03/09/22    Authorization Type [employer]    Authorization Time Period 04/03/34-03/22/42; recert 97/53/0051- 1/0/2111    Progress Note Due on Visit 80    PT Start Time 0935    PT Stop Time 1013    PT Time Calculation (min) 38 min    Equipment Utilized During Treatment Gait belt    Activity Tolerance Patient tolerated treatment well;No increased pain    Behavior During Therapy [facility] for tasks assessed/performed              Past Medical History:  Diagnosis Date   Anxiety    Arteriosclerosis of coronary artery 11/16/2011   Overview:  Stent 10/2011 stent rca 2015 with collaterals to lad which is chronically occluded    Benign enlargement of prostate    Benign essential HTN 06/11/2014   Benign prostatic hypertrophy without urinary obstruction 07/31/2014   Bilateral cataracts 05/16/2013   Overview:  Dr. [facility]     Bone spur of foot    Left   BP (high blood pressure) 11/09/2012   Cancer ([place])    skin (forehead) and bladder   Carotid artery narrowing 02/08/2014   Depression    Detrusor hypertrophy    Diabetes ([place])    Diabetes mellitus, type 2 ([place]) 12/12/2012   Diverticulosis    Dyspnea    Esophageal reflux    Esophageal reflux    Fothergill's neuralgia 08/07/2012   Overview:  [facility]    Gastritis    GERD (gastroesophageal reflux disease)    Headache    cluster headaches   Healed myocardial infarct 11/09/2012   Hearing loss in left ear    Heart disease    Hematuria    Hemorrhoids    History of hiatal hernia 12/14/2017   small    Hypercholesteremia    Lesion of bladder    Myocardial infarct (HCC)    Presence of stent in  coronary artery 11/09/2012   Rectal bleeding    Trigeminal neuralgia    Trigeminal neuralgia    Valvular heart disease    Vitamin D deficiency    Past Surgical History:  Procedure Laterality Date   APPENDECTOMY     BOTOX INJECTION N/A 12/21/2017   Procedure: Bladder BOTOX INJECTION;  Surgeon: [name], MD;  Location: [facility];  Service: Urology;  Laterality: N/A;   BOTOX INJECTION N/A 09/11/2018   Procedure: Bladder BOTOX INJECTION;  Surgeon: [name], MD;  Location: [facility];  Service: Urology;  Laterality: N/A;   CARDIAC CATHETERIZATION     CARDIAC CATHETERIZATION N/A 01/22/2015   Procedure: Left Heart Cath;  Surgeon: Corey Skains, MD;  Location: [facility];  Service: Cardiovascular;  Laterality: N/A;   CARDIAC CATHETERIZATION N/A 01/22/2015   Procedure: Coronary Stent Intervention;  Surgeon: Isaias Cowman, MD;  Location: [facility];  Service: Cardiovascular;  Laterality: N/A;   CATARACT EXTRACTION, BILATERAL     COLONOSCOPY WITH PROPOFOL N/A 12/08/2015   Procedure: COLONOSCOPY WITH PROPOFOL;  Surgeon: Lollie Sails, MD;  Location: [facility] END[facility];  Service: Endoscopy;  Laterality: N/A;   COLONOSCOPY WITH PROPOFOL  N/A 12/09/2015   Procedure: COLONOSCOPY WITH PROPOFOL;  Surgeon: Lollie Sails, MD;  Location: [facility]COPY;  Service: Endoscopy;  Laterality: N/A;   CORONARY ANGIOPLASTY     5 stents   CORONARY STENT PLACEMENT  2015   x5   CYSTOSCOPY N/A 09/11/2018   Procedure: CYSTOSCOPY;  Surgeon: [name], MD;  Location: [facility];  Service: Urology;  Laterality: N/A;   CYSTOSCOPY WITH BIOPSY N/A 12/21/2017   Procedure: CYSTOSCOPY WITH BIOPSY;  Surgeon: [name], MD;  Location: [facility];  Service: Urology;  Laterality: N/A;   ESOPHAGOGASTRODUODENOSCOPY (EGD) WITH PROPOFOL N/A 12/08/2015   Procedure: ESOPHAGOGASTRODUODENOSCOPY (EGD) WITH PROPOFOL;  Surgeon: Lollie Sails, MD;  Location: [facility];  Service:  Endoscopy;  Laterality: N/A;   ESOPHAGOGASTRODUODENOSCOPY (EGD) WITH PROPOFOL N/A 12/13/2017   Procedure: ESOPHAGOGASTRODUODENOSCOPY (EGD) WITH PROPOFOL;  Surgeon: Lollie Sails, MD;  Location: [facility] ENDOSCOPY;  Service: Endoscopy;  Laterality: N/A;   EYE SURGERY     HERNIA REPAIR     kidney tumor remove     TRANSURETHRAL RESECTION OF BLADDER TUMOR WITH GYRUS (TURBT-GYRUS)  01/4579   UMBILICAL HERNIA REPAIR     urethral meatotomy     Patient Active Problem List   Diagnosis Date Noted   Class 2 obesity due to excess calories with body mass index (BMI) of 36.0 to 36.9 in adult 04/11/2020   Swelling of limb 03/28/2020   Lymphedema 03/28/2020   Trigeminal neuralgia 12/25/2019   Lower limb ulcer, calf, left, limited to breakdown of skin ([place]) 12/25/2019   OSA (obstructive sleep apnea) 99/83/3825   Acute systolic CHF (congestive heart failure) ([place]) 12/12/2018   Bruising 07/10/2018   Diet-controlled type 2 diabetes mellitus ([place]) 03/24/2018   Microalbuminuria 03/24/2018   Dizziness 11/17/2017   Simple chronic bronchitis ([place]) 09/22/2017   SOBOE (shortness of breath on exertion) 02/18/2017   Chronic midline low back pain without sciatica 12/29/2016   Periodic limb movement disorder 12/29/2016   Benign essential tremor 06/22/2016   Pure hypercholesterolemia 06/20/2015   Erectile dysfunction due to arterial insufficiency 06/16/2015   Unstable angina ([place]) 01/22/2015   Abdominal aortic 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 rupture ([place]) 12/31/2014   Benign prostatic hypertrophy without urinary obstruction 07/31/2014   Enlarged prostate 07/31/2014   Benign essential HTN 06/11/2014   Carotid artery narrowing 02/08/2014   Carotid artery obstruction 02/08/2014   Bilateral carotid artery stenosis 02/08/2014   Chest pain 08/20/2013   Bilateral cataracts 05/16/2013   Cataract 05/16/2013   Clinical depression 12/12/2012   Diabetes mellitus, type 2 ([place]) 12/12/2012   Combined fat and carbohydrate  induced hyperlipemia 12/12/2012   Major depressive disorder, single episode, unspecified 12/12/2012   Acid reflux 11/09/2012   Presence of stent in coronary artery 11/09/2012   BP (high blood pressure) 11/09/2012   Healed myocardial infarct 11/09/2012   Gastro-esophageal reflux disease without esophagitis 11/09/2012   History of cardiovascular surgery 11/09/2012   Fothergill's neuralgia 08/07/2012   Swelling of testicle 04/06/2012   Disorder of male genital organ 04/06/2012   Swelling of the testicles 04/06/2012   Arteriosclerosis of coronary artery 11/16/2011   CAD in native artery 11/16/2011   Fatigue 06/04/2011   Avitaminosis D 11/30/2010    REFERRING DIAG: Repeated falls   THERAPY DIAG:   Difficulty in walking, not elsewhere classified  Muscle weakness (generalized)  Other lack of coordination  Abnormality of gait and mobility  Other abnormalities of gait and mobility  Unsteadiness on feet  Chronic bilateral low back pain without sciatica  Rationale for Evaluation and Treatment Rehabilitation   Subjective Assessment - 09/22/21 0936     Subjective Pt reports he is still struggling with walking this week. Denies any falls but reports just not moving around very quick at all.    Patient is accompained by: Family member -Wife   Pertinent History Patient is a 76 year old male with referral for Physical Therapy with diagnosis of Parkinsons disease and history of falling. He reports he diagnosed last year with Parkinsons and has improved his tremors with use of medication but reports worsening shuffling and difficulty with mobility. Patient has past medical history signifiant for Parkinsons; Arthritis, bladder cancer, Trigemic Neuralgia. He lives with wife in 1 level home and current using cane with reported recent fall.    Limitations Walking;Lifting;Standing;House hold activities    How long can you sit comfortably? No limits    How long can you stand comfortably? 15 min-  limited due to back pain and foot numbess    How long can you walk comfortably? about 80 feet    Patient Stated Goals Improve my balance, be able to get up off the floor so I can maybe work in my garage again.    Currently in Pain? No/denies               PRECAUTIONS: Falls   PAIN:   No pain reported.     TODAY'S TREATMENT: 01/12/2022   There.ex:   NuStep Level 4 for 5 min for improved LE strength, coordination and endurance training   NMR:  High knee march in // bars -down and back x5 Side steps in // bars - down and back x3 Heel to toe sequencing using Matrix cable system 7.5# x 12 reps each LE. Positioned hedgehogs (staggered in // bars) - to focus on increasing step height/length. Instructed to step over hedgehogs x 5 trials - down and back.  Progressed to stepping inside/out around hedgehogs (focusing on hip rotators) x 5 (patient with max difficulty coordinating steps this date) Retro gait- 3# AW donned- VC to take large steps back for posterior strengthening Forward step with left LE with trunk twist to right and clap then step forward with right LE and trunk twist to left- repeat down and back in // bars x 3 times.   Ambulation around the gym with  x3 laps total using upright 4WW- Most difficulty going through more narrow pathways with VC to keep feet moving and not shuffle.          PATIENT EDUCATION: Education details: form/technique with exercise Person educated: Patient Education method: Explanation, Demonstration, Tactile cues, and Verbal cues Education comprehension: verbalized understanding, returned demonstration, verbal cues required, tactile cues required, and needs further education   HOME EXERCISE PROGRAM: No updates today   PT Short Term Goals -       PT SHORT TERM GOAL #1   Title Pt will be independent with INITIAL  HEP in order to improve strength and balance in order to decrease fall risk and improve function at home and work.     Baseline 04/07/2021= No formal HEP in place. 05/11/2021= Patient reports doing pretty good- compliant with walking program and his stretching/strengthening.    Time 6    Period Weeks    Status Achieved    Target Date 05/19/21      PT SHORT TERM GOAL #2   Title Pt will decrease 5TSTS by at least 3 seconds in order to demonstrate clinically significant improvement in  LE strength.    Baseline 04/07/2021= 25 sec; 05/11/2021= 18.65 sec    Time 6    Period Weeks    Status Achieved    Target Date 06/30/21              PT Long Term Goals -       PT LONG TERM GOAL #1   Title Pt will be independent with FINAL HEP in order to improve strength and balance in order to decrease fall risk and improve function at home and work.    Baseline 04/07/2021= No formal HEP in place. 07/01/2021=Patient reports performing walking and some standing LE strengthening exercises as instructed and states no questions at this time.  09/02/21: Pt reports he feels indep with exercises but is not performing HEP as much as he should; 11/17/2021= Patient reports compliant with current HEP- "I do them as best I can." 12/15/2021- Patient reports doing some exercises and walking but not feeling well in past week and denied performing HEP. Will keep goal active to ensure compliance and update HEP as appropriate.    Time 12    Period Weeks    Status Partially Met    Target Date 03/09/2022     PT LONG TERM GOAL #2   Title Pt will improve FOTO to target score of 45% to display perceived improvements in ability to complete ADL's.    Baseline 2/7/22023= 41%; FOTO=53%    Time 12    Period Weeks    Status Achieved    Target Date 06/30/21      PT LONG TERM GOAL #3   Title Pt will decrease 5TSTS by at least 5 seconds in order to demonstrate clinically significant improvement in LE strength.    Baseline 04/07/2021= 25 sec; 05/11/2021= 18.65 sec without UE support; 4/10= 13.76 sec without UE support    Time 12    Period Weeks    Status  Achieved    Target Date 06/30/21      PT LONG TERM GOAL #4   Title Pt will decrease TUG to below 17 seconds/decrease in order to demonstrate decreased fall risk.    Baseline 04/07/2021= 21 sec with SPC; 05/11/2021= 18.85 sec with use of cane; 07/21/2021=18.88 sec  avg with use of cane; 7/5: 22.85 sec with QC; 09/29/2021= 21.45 sec using upright walker; 11/17/2021= 20. 46 sec using upright walker; 10/17= 24 sec with upright walker   Time 12    Period Weeks    Status Partially Met    Target Date 03/09/2022     PT LONG TERM GOAL #5   Title Pt will increase 10MWT by at least 0.15 m/s in order to demonstrate clinically significant improvement in community ambulation.    Baseline 04/07/2021= 0.56 m/s using SPC; 05/11/2021= 0.54 m/s using SPC; 07/01/2021= 0.6 m/s/ 7/5: 0.48 m/s without AD but with CGA; 09/29/2021= 0.64 m/s; 11/17/2021= 0.61 m/s using upright 4WW; 10/17= 0.59 m/s; 12/29/2021= 0.64 m/s   Time 12    Period Weeks    Status On-going    Target Date 03/09/2022     PT LONG TERM GOAL #6   Title Pt will increase 6MWT by at least 30m(1611f in order to demonstrate clinically significant improvement in cardiopulmonary endurance and community ambulation    Baseline 06/08/2021= 600 ft. in 5 min- Stopped due to fatigue using 4WW; 07/01/2021= 665 in 5 min 45 sec using 4WW; 7/5: 367 ft with QC; 09/29/2021= 740 feet using upright 4WW; 11/17/2021= 705 feet using  upright 4WW; 12/15/2021= 680 feet with upright 4WW   Time 12   Period Weeks    Status On-going    Target Date 03/09/2022              Plan - 08/20/21 0939     Clinical Impression Statement Pt presents with good motivation but more difficulty with coordination of activities- more stiffness (decreased height of march, decreased retro and side step today). He was able to participate well without fatigue and able to increase his step length during heel to toe activity.   Pt will continue to benefit from skilled therapy to address remaining deficits in order to  improve overall QoL and return to PLOF.        Personal Factors and Comorbidities Comorbidity 3+    Comorbidities arthritis, bladder cancer, Trigeminal Neuralgia    Examination-Activity Limitations Caring for Others;Carry;Continence;Lift;Squat;Stairs;Stand    Examination-Participation Restrictions Community Activity;Yard Work    [employer] Evolving/Moderate complexity    Rehab Potential Good    PT Frequency 2x / week    PT Duration 12 weeks    PT Treatment/Interventions ADLs/Self Care Home Management;Cryotherapy;Canalith Repostioning;Electrical Stimulation;Moist Heat;DME Instruction;Gait training;Stair training;Functional mobility training;Therapeutic activities;Therapeutic exercise;Balance training;Neuromuscular re-education;Patient/family education;Manual techniques;Passive range of motion;Dry needling;Vestibular    PT Next Visit Plan LE strength, improve step length with ambulation, muscle tissue lengthening    PT Home Exercise Plan Access Code: [place], PT Physical Therapist- [facility]  01/12/22, 4:48 PM

## 2022-01-14 ENCOUNTER — Ambulatory Visit: Payer: Medicare HMO

## 2022-01-14 DIAGNOSIS — G8929 Other chronic pain: Secondary | ICD-10-CM

## 2022-01-14 DIAGNOSIS — R262 Difficulty in walking, not elsewhere classified: Secondary | ICD-10-CM | POA: Diagnosis not present

## 2022-01-14 DIAGNOSIS — R278 Other lack of coordination: Secondary | ICD-10-CM

## 2022-01-14 DIAGNOSIS — R2681 Unsteadiness on feet: Secondary | ICD-10-CM

## 2022-01-14 DIAGNOSIS — R269 Unspecified abnormalities of gait and mobility: Secondary | ICD-10-CM

## 2022-01-14 DIAGNOSIS — R2689 Other abnormalities of gait and mobility: Secondary | ICD-10-CM

## 2022-01-14 DIAGNOSIS — M6281 Muscle weakness (generalized): Secondary | ICD-10-CM

## 2022-01-14 NOTE — Therapy (Signed)
OUTPATIENT PHYSICAL THERAPY TREATMENT NOTE      Patient Name: [name] MRN: [id number] DOB:03-Jun-1945, 76 y.o., male Today's Date: 01/15/2022  PCP: [name]. [name], FNP REFERRING PROVIDER: Dr. Gurney Maxin   PT End of Session - 01/14/22 0935     Visit Number 39    Number of Visits 31    Date for PT Re-Evaluation 03/09/22    Authorization Type [employer]    Authorization Time Period 07/05/24-04/03/53; recert 97/41/6384- 07/02/6466    Progress Note Due on Visit 80    PT Start Time 0929    PT Stop Time 1014    PT Time Calculation (min) 45 min    Equipment Utilized During Treatment Gait belt    Activity Tolerance Patient tolerated treatment well;No increased pain    Behavior During Therapy [facility] for tasks assessed/performed              Past Medical History:  Diagnosis Date   Anxiety    Arteriosclerosis of coronary artery 11/16/2011   Overview:  Stent 10/2011 stent rca 2015 with collaterals to lad which is chronically occluded    Benign enlargement of prostate    Benign essential HTN 06/11/2014   Benign prostatic hypertrophy without urinary obstruction 07/31/2014   Bilateral cataracts 05/16/2013   Overview:  Dr. [facility]     Bone spur of foot    Left   BP (high blood pressure) 11/09/2012   Cancer ([place])    skin (forehead) and bladder   Carotid artery narrowing 02/08/2014   Depression    Detrusor hypertrophy    Diabetes ([place])    Diabetes mellitus, type 2 ([place]) 12/12/2012   Diverticulosis    Dyspnea    Esophageal reflux    Esophageal reflux    Fothergill's neuralgia 08/07/2012   Overview:  [facility]    Gastritis    GERD (gastroesophageal reflux disease)    Headache    cluster headaches   Healed myocardial infarct 11/09/2012   Hearing loss in left ear    Heart disease    Hematuria    Hemorrhoids    History of hiatal hernia 12/14/2017   small    Hypercholesteremia    Lesion of bladder    Myocardial infarct (HCC)    Presence of stent in  coronary artery 11/09/2012   Rectal bleeding    Trigeminal neuralgia    Trigeminal neuralgia    Valvular heart disease    Vitamin D deficiency    Past Surgical History:  Procedure Laterality Date   APPENDECTOMY     BOTOX INJECTION N/A 12/21/2017   Procedure: Bladder BOTOX INJECTION;  Surgeon: [name], MD;  Location: [facility] ORS;  Service: Urology;  Laterality: N/A;   BOTOX INJECTION N/A 09/11/2018   Procedure: Bladder BOTOX INJECTION;  Surgeon: [name], MD;  Location: [facility];  Service: Urology;  Laterality: N/A;   CARDIAC CATHETERIZATION     CARDIAC CATHETERIZATION N/A 01/22/2015   Procedure: Left Heart Cath;  Surgeon: Corey Skains, MD;  Location: [place] [facility];  Service: Cardiovascular;  Laterality: N/A;   CARDIAC CATHETERIZATION N/A 01/22/2015   Procedure: Coronary Stent Intervention;  Surgeon: Isaias Cowman, MD;  Location: [place] [facility];  Service: Cardiovascular;  Laterality: N/A;   CATARACT EXTRACTION, BILATERAL     COLONOSCOPY WITH PROPOFOL N/A 12/08/2015   Procedure: COLONOSCOPY WITH PROPOFOL;  Surgeon: Lollie Sails, MD;  Location: [facility] ENDOSCOPY;  Service: Endoscopy;  Laterality: N/A;   COLONOSCOPY WITH PROPOFOL  N/A 12/09/2015   Procedure: COLONOSCOPY WITH PROPOFOL;  Surgeon: Lollie Sails, MD;  Location: [facility] ENDOSCOPY;  Service: Endoscopy;  Laterality: N/A;   CORONARY ANGIOPLASTY     5 stents   CORONARY STENT PLACEMENT  2015   x5   CYSTOSCOPY N/A 09/11/2018   Procedure: CYSTOSCOPY;  Surgeon: [name], MD;  Location: [facility];  Service: Urology;  Laterality: N/A;   CYSTOSCOPY WITH BIOPSY N/A 12/21/2017   Procedure: CYSTOSCOPY WITH BIOPSY;  Surgeon: [name], MD;  Location: [facility];  Service: Urology;  Laterality: N/A;   ESOPHAGOGASTRODUODENOSCOPY (EGD) WITH PROPOFOL N/A 12/08/2015   Procedure: ESOPHAGOGASTRODUODENOSCOPY (EGD) WITH PROPOFOL;  Surgeon: Lollie Sails, MD;  Location: [facility]COPY;  Service:  Endoscopy;  Laterality: N/A;   ESOPHAGOGASTRODUODENOSCOPY (EGD) WITH PROPOFOL N/A 12/13/2017   Procedure: ESOPHAGOGASTRODUODENOSCOPY (EGD) WITH PROPOFOL;  Surgeon: Lollie Sails, MD;  Location: [facility] ENDOSCOPY;  Service: Endoscopy;  Laterality: N/A;   EYE SURGERY     HERNIA REPAIR     kidney tumor remove     TRANSURETHRAL RESECTION OF BLADDER TUMOR WITH GYRUS (TURBT-GYRUS)  01/4579   UMBILICAL HERNIA REPAIR     urethral meatotomy     Patient Active Problem List   Diagnosis Date Noted   Class 2 obesity due to excess calories with body mass index (BMI) of 36.0 to 36.9 in adult 04/11/2020   Swelling of limb 03/28/2020   Lymphedema 03/28/2020   Trigeminal neuralgia 12/25/2019   Lower limb ulcer, calf, left, limited to breakdown of skin ([place]) 12/25/2019   OSA (obstructive sleep apnea) 99/83/3825   Acute systolic CHF (congestive heart failure) ([place]) 12/12/2018   Bruising 07/10/2018   Diet-controlled type 2 diabetes mellitus ([place]) 03/24/2018   Microalbuminuria 03/24/2018   Dizziness 11/17/2017   Simple chronic bronchitis ([place]) 09/22/2017   SOBOE (shortness of breath on exertion) 02/18/2017   Chronic midline low back pain without sciatica 12/29/2016   Periodic limb movement disorder 12/29/2016   Benign essential tremor 06/22/2016   Pure hypercholesterolemia 06/20/2015   Erectile dysfunction due to arterial insufficiency 06/16/2015   Unstable angina ([place]) 01/22/2015   Abdominal aortic 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 rupture ([place]) 12/31/2014   Benign prostatic hypertrophy without urinary obstruction 07/31/2014   Enlarged prostate 07/31/2014   Benign essential HTN 06/11/2014   Carotid artery narrowing 02/08/2014   Carotid artery obstruction 02/08/2014   Bilateral carotid artery stenosis 02/08/2014   Chest pain 08/20/2013   Bilateral cataracts 05/16/2013   Cataract 05/16/2013   Clinical depression 12/12/2012   Diabetes mellitus, type 2 ([place]) 12/12/2012   Combined fat and carbohydrate  induced hyperlipemia 12/12/2012   Major depressive disorder, single episode, unspecified 12/12/2012   Acid reflux 11/09/2012   Presence of stent in coronary artery 11/09/2012   BP (high blood pressure) 11/09/2012   Healed myocardial infarct 11/09/2012   Gastro-esophageal reflux disease without esophagitis 11/09/2012   History of cardiovascular surgery 11/09/2012   Fothergill's neuralgia 08/07/2012   Swelling of testicle 04/06/2012   Disorder of male genital organ 04/06/2012   Swelling of the testicles 04/06/2012   Arteriosclerosis of coronary artery 11/16/2011   CAD in native artery 11/16/2011   Fatigue 06/04/2011   Avitaminosis D 11/30/2010    REFERRING DIAG: Repeated falls   THERAPY DIAG:   Difficulty in walking, not elsewhere classified  Muscle weakness (generalized)  Other lack of coordination  Abnormality of gait and mobility  Other abnormalities of gait and mobility  Unsteadiness on feet  Chronic bilateral low back pain without sciatica  Rationale for Evaluation and Treatment Rehabilitation   Subjective Assessment - 09/22/21 0936     Subjective Pt reports moving some better today than earlier this week. He denies any falls or pain today.   Patient is accompained by: Family member -Wife   Pertinent History Patient is a 76 year old male with referral for Physical Therapy with diagnosis of Parkinsons disease and history of falling. He reports he diagnosed last year with Parkinsons and has improved his tremors with use of medication but reports worsening shuffling and difficulty with mobility. Patient has past medical history signifiant for Parkinsons; Arthritis, bladder cancer, Trigemic Neuralgia. He lives with wife in 1 level home and current using cane with reported recent fall.    Limitations Walking;Lifting;Standing;House hold activities    How long can you sit comfortably? No limits    How long can you stand comfortably? 15 min- limited due to back pain and foot  numbess    How long can you walk comfortably? about 80 feet    Patient Stated Goals Improve my balance, be able to get up off the floor so I can maybe work in my garage again.    Currently in Pain? No/denies               PRECAUTIONS: Falls   PAIN:   No pain reported.     TODAY'S TREATMENT: 01/14/2022   There.ex:   NuStep INTERVAL Training L1-5 for 7 min for improved LE strength, coordination and endurance training. Total time = 7 min and 0.24 mi  Seated pectoral/Sh ER stretch using PVC pipe Seated overhead raises using 1in PVC pipe.  Seated lumbar flex stretch- rolling silver theraball diagonally forward to left and right x 20 reps each Standing heel to toe gait sequencing using 2.5 # 2 set of 15 reps each LE- Patient reported as fatiguing.  Retro gait- 3# AW donned- VC to take large steps back for posterior strengthening Ambulation around the gym with  x2 laps total using upright 4WW and use of metronome at 62 to attempt to keep him moving at a constant cadence with good results as he was mostly compliant with step cadence stating he did feel like the sound helped him stay in rhythm.          PATIENT EDUCATION: Education details: form/technique with exercise Person educated: Patient Education method: Explanation, Demonstration, Tactile cues, and Verbal cues Education comprehension: verbalized understanding, returned demonstration, verbal cues required, tactile cues required, and needs further education   HOME EXERCISE PROGRAM: No updates today   PT Short Term Goals -       PT SHORT TERM GOAL #1   Title Pt will be independent with INITIAL  HEP in order to improve strength and balance in order to decrease fall risk and improve function at home and work.    Baseline 04/07/2021= No formal HEP in place. 05/11/2021= Patient reports doing pretty good- compliant with walking program and his stretching/strengthening.    Time 6    Period Weeks    Status Achieved     Target Date 05/19/21      PT SHORT TERM GOAL #2   Title Pt will decrease 5TSTS by at least 3 seconds in order to demonstrate clinically significant improvement in LE strength.    Baseline 04/07/2021= 25 sec; 05/11/2021= 18.65 sec    Time 6    Period Weeks    Status Achieved    Target Date 06/30/21  PT Long Term Goals -       PT LONG TERM GOAL #1   Title Pt will be independent with FINAL HEP in order to improve strength and balance in order to decrease fall risk and improve function at home and work.    Baseline 04/07/2021= No formal HEP in place. 07/01/2021=Patient reports performing walking and some standing LE strengthening exercises as instructed and states no questions at this time.  09/02/21: Pt reports he feels indep with exercises but is not performing HEP as much as he should; 11/17/2021= Patient reports compliant with current HEP- "I do them as best I can." 12/15/2021- Patient reports doing some exercises and walking but not feeling well in past week and denied performing HEP. Will keep goal active to ensure compliance and update HEP as appropriate.    Time 12    Period Weeks    Status Partially Met    Target Date 03/09/2022     PT LONG TERM GOAL #2   Title Pt will improve FOTO to target score of 45% to display perceived improvements in ability to complete ADL's.    Baseline 2/7/22023= 41%; FOTO=53%    Time 12    Period Weeks    Status Achieved    Target Date 06/30/21      PT LONG TERM GOAL #3   Title Pt will decrease 5TSTS by at least 5 seconds in order to demonstrate clinically significant improvement in LE strength.    Baseline 04/07/2021= 25 sec; 05/11/2021= 18.65 sec without UE support; 4/10= 13.76 sec without UE support    Time 12    Period Weeks    Status Achieved    Target Date 06/30/21      PT LONG TERM GOAL #4   Title Pt will decrease TUG to below 17 seconds/decrease in order to demonstrate decreased fall risk.    Baseline 04/07/2021= 21 sec with SPC;  05/11/2021= 18.85 sec with use of cane; 07/21/2021=18.88 sec  avg with use of cane; 7/5: 22.85 sec with QC; 09/29/2021= 21.45 sec using upright walker; 11/17/2021= 20. 46 sec using upright walker; 10/17= 24 sec with upright walker   Time 12    Period Weeks    Status Partially Met    Target Date 03/09/2022     PT LONG TERM GOAL #5   Title Pt will increase 10MWT by at least 0.15 m/s in order to demonstrate clinically significant improvement in community ambulation.    Baseline 04/07/2021= 0.56 m/s using SPC; 05/11/2021= 0.54 m/s using SPC; 07/01/2021= 0.6 m/s/ 7/5: 0.48 m/s without AD but with CGA; 09/29/2021= 0.64 m/s; 11/17/2021= 0.61 m/s using upright 4WW; 10/17= 0.59 m/s; 12/29/2021= 0.64 m/s   Time 12    Period Weeks    Status On-going    Target Date 03/09/2022     PT LONG TERM GOAL #6   Title Pt will increase 6MWT by at least 30m(164f in order to demonstrate clinically significant improvement in cardiopulmonary endurance and community ambulation    Baseline 06/08/2021= 600 ft. in 5 min- Stopped due to fatigue using 4WW; 07/01/2021= 665 in 5 min 45 sec using 4WW; 7/5: 367 ft with QC; 09/29/2021= 740 feet using upright 4WW; 11/17/2021= 705 feet using upright 4WW; 12/15/2021= 680 feet with upright 4WW   Time 12   Period Weeks    Status On-going    Target Date 03/09/2022              Plan - 08/20/21 [id number]  Clinical Impression Statement Patient presented with good motivation for today's activities. He was able to complete some ROM activities and demo improved flexibility in chest/shoulders/trunk after activities. He ambulated with more consistent step cadence with use of metronome today and may benefit from further trial to see if consistently helps. Pt will continue to benefit from skilled therapy to address remaining deficits in order to improve overall QoL and return to PLOF.        Personal Factors and Comorbidities Comorbidity 3+    Comorbidities arthritis, bladder cancer, Trigeminal Neuralgia     Examination-Activity Limitations Caring for Others;Carry;Continence;Lift;Squat;Stairs;Stand    Examination-Participation Restrictions Community Activity;Yard Work    [employer] Evolving/Moderate complexity    Rehab Potential Good    PT Frequency 2x / week    PT Duration 12 weeks    PT Treatment/Interventions ADLs/Self Care Home Management;Cryotherapy;Canalith Repostioning;Electrical Stimulation;Moist Heat;DME Instruction;Gait training;Stair training;Functional mobility training;Therapeutic activities;Therapeutic exercise;Balance training;Neuromuscular re-education;Patient/family education;Manual techniques;Passive range of motion;Dry needling;Vestibular    PT Next Visit Plan LE strength, improve step length with ambulation, muscle tissue lengthening    PT Home Exercise Plan Access Code: [place], PT Physical Therapist- [facility]  01/15/22, 7:40 AM

## 2022-01-15 ENCOUNTER — Other Ambulatory Visit: Payer: Self-pay

## 2022-01-15 ENCOUNTER — Emergency Department
Admission: EM | Admit: 2022-01-15 | Discharge: 2022-01-15 | Disposition: A | Discharge status: Home / Self Care | Payer: Medicare HMO | Attending: Emergency Medicine | Admitting: Emergency Medicine

## 2022-01-15 DIAGNOSIS — L03115 Cellulitis of right lower limb: Secondary | ICD-10-CM | POA: Diagnosis not present

## 2022-01-15 DIAGNOSIS — I10 Essential (primary) hypertension: Secondary | ICD-10-CM | POA: Insufficient documentation

## 2022-01-15 DIAGNOSIS — E119 Type 2 diabetes mellitus without complications: Secondary | ICD-10-CM | POA: Insufficient documentation

## 2022-01-15 DIAGNOSIS — L03116 Cellulitis of left lower limb: Secondary | ICD-10-CM | POA: Insufficient documentation

## 2022-01-15 DIAGNOSIS — R600 Localized edema: Secondary | ICD-10-CM | POA: Insufficient documentation

## 2022-01-15 DIAGNOSIS — R2243 Localized swelling, mass and lump, lower limb, bilateral: Secondary | ICD-10-CM | POA: Diagnosis present

## 2022-01-15 DIAGNOSIS — R609 Edema, unspecified: Secondary | ICD-10-CM

## 2022-01-15 DIAGNOSIS — I890 Lymphedema, not elsewhere classified: Secondary | ICD-10-CM | POA: Diagnosis not present

## 2022-01-15 DIAGNOSIS — I2510 Atherosclerotic heart disease of native coronary artery without angina pectoris: Secondary | ICD-10-CM | POA: Insufficient documentation

## 2022-01-15 DIAGNOSIS — L03119 Cellulitis of unspecified part of limb: Secondary | ICD-10-CM

## 2022-01-15 LAB — BASIC METABOLIC PANEL
Anion gap: 9 (ref 5–15)
BUN: 14 mg/dL (ref 8–23)
CO2: 23 mmol/L (ref 22–32)
Calcium: 8.7 mg/dL — ABNORMAL LOW (ref 8.9–10.3)
Chloride: 105 mmol/L (ref 98–111)
Creatinine, Ser: 0.96 mg/dL (ref 0.61–1.24)
GFR, Estimated: >60 mL/min (ref >60)
Glucose, Bld: 110 mg/dL — ABNORMAL HIGH (ref 70–99)
Potassium: 4.1 mmol/L (ref 3.5–5.1)
Sodium: 137 mmol/L (ref 135–145)

## 2022-01-15 LAB — CBC
HCT: 45.3 % (ref 39.0–52.0)
Hemoglobin: 15 g/dL (ref 13.0–17.0)
MCH: 29.6 pg (ref 26.0–34.0)
MCHC: 33.1 g/dL (ref 30.0–36.0)
MCV: 89.3 fL (ref 80.0–100.0)
Platelets: 162 10*3/uL (ref 150–400)
RBC: 5.07 MIL/uL (ref 4.22–5.81)
RDW: 12.9 % (ref 11.5–15.5)
WBC: 7.5 10*3/uL (ref 4.0–10.5)
nRBC: 0 % (ref 0.0–0.2)

## 2022-01-15 LAB — LACTIC ACID, PLASMA: Lactic Acid, Venous: 1.5 mmol/L (ref 0.5–1.9)

## 2022-01-15 MED ORDER — CEPHALEXIN 500 MG PO CAPS: 500.0000 mg | ORAL_CAPSULE | Freq: Four times a day (QID) | ORAL | 0 refills | Status: AC

## 2022-01-15 MED ORDER — CEPHALEXIN 500 MG PO CAPS
500.0000 mg | ORAL_CAPSULE | Freq: Once | ORAL | Status: AC
  Administered 2022-01-15: 500 mg via ORAL
  Filled 2022-01-15: qty 1

## 2022-01-15 NOTE — ED Provider Notes (Signed)
[facility] Provider Note    Event Date/Time   First MD Initiated Contact with Patient 01/15/22 1702     (approximate)   History   Leg Swelling   HPI  [name] is a 76 y.o. male   Past medical history of CAD, diabetes, hypertension, hyperlipidemia who presents to the emergency department with bilateral lower leg edema is chronic but just recently started weeping yellow clear fluid.  He has noticed this for the past week.  He denies systemic symptoms like fever or chills.  He has no chest pain or shortness of breath.  Both legs are affected equally.  He is still ambulatory with walker/cane given his Parkinson's disease he has slow to walk.  History was obtained via patient. His wife is at bedside as an independent historian offering collateral information.      Physical Exam   Triage Vital Signs: ED Triage Vitals  Enc Vitals Group     BP 01/15/22 1537 (!) 176/91     Pulse Rate 01/15/22 1537 (!) 58     Resp 01/15/22 1537 19     Temp 01/15/22 1537 98.2 F (36.8 C)     Temp Source 01/15/22 1537 Oral     SpO2 01/15/22 1537 98 %     Weight --      Height --      Head Circumference --      Peak Flow --      Pain Score 01/15/22 1537 5     Pain Loc --      Pain Edu? --      Excl. in [place]? --     Most recent vital signs: Vitals:   01/15/22 1537  BP: (!) 176/91  Pulse: (!) 58  Resp: 19  Temp: 98.2 F (36.8 C)  SpO2: 98%    General: Awake, no distress.  CV:  Good peripheral perfusion.  Resp:  Normal effort.  Abd:  No distention.  Other:  Bilateral lower extremity pitting edema equal on both sides and nontender.  Neurovascular intact.  He has some small ulcerations with a very small surrounding erythematous change, no crepitus or pain out of proportion.   ED Results / Procedures / Treatments   Labs (all labs ordered are listed, but only abnormal results are displayed) Labs Reviewed  BASIC METABOLIC PANEL - Abnormal; Notable for the  following components:      Result Value   Glucose, Bld 110 (*)    Calcium 8.7 (*)    All other components within normal limits  LACTIC ACID, PLASMA  CBC  LACTIC ACID, PLASMA     I reviewed labs and they are notable for normal white blood cell count and normal lactic acid.   PROCEDURES:  Critical Care performed: No  Procedures   MEDICATIONS ORDERED IN ED: Medications  cephALEXin (KEFLEX) capsule 500 mg (has no administration in time range)    IMPRESSION / MDM / ASSESSMENT AND PLAN / ED COURSE  I reviewed the triage vital signs and the nursing notes.                              Differential diagnosis includes, but is not limited to, venous insufficiency, lymphedema, CHF exacerbation, cellulitis, abscess DVT   MDM: Has bilateral lower extremity edema which is chronic in nature is most likely venous insufficiency/lymphedema with some weeping bilaterally, doubt DVT given bilateral nontender.  Neurovascular intact doubt  arterial disease.  He does not appear septic but there is some mild redness around the ulcerations on bilateral lower extremities and will treat with Keflex for skin infection.  I advised him to take compression stockings.  Follow-up with PMD.   Patient's presentation is most consistent with acute presentation with potential threat to life or bodily function.       FINAL CLINICAL IMPRESSION(S) / ED DIAGNOSES   Final diagnoses:  Peripheral edema  Lymphedema  Cellulitis of lower extremity, unspecified laterality     Rx / DC Orders   ED Discharge Orders          Ordered    cephALEXin (KEFLEX) 500 MG capsule  4 times daily        01/15/22 1726             Note:  This document was prepared using Dragon voice recognition software and may include unintentional dictation errors.    Lucillie Garfinkel, MD 01/15/22 1736

## 2022-01-15 NOTE — ED Triage Notes (Signed)
Pt comes with c/o bilateral leg swelling, sore and weeping. Pt states this started 2-3 days ago. Pt states he noticed the weeping today.

## 2022-01-15 NOTE — ED Notes (Signed)
E signature pad not working. Pt educated on discharge instructions and verbalized understanding.  

## 2022-01-15 NOTE — Discharge Instructions (Signed)
Take antibiotics.  Use compression stockings  Thank you for choosing [place] for your health care today!  Please see your primary doctor this week for a follow up appointment.   If you do not have a primary doctor call the following clinics to establish care:  If you have insurance:  [facility] [phone] [place] [address]   [facility]  [phone] [address] [place] [address]   If you do not have insurance:  [facility]  [phone] [address], [place] [address]  Sometimes, in the early stages of certain disease courses it is difficult to detect in the emergency department evaluation -- so, it is important that you continue to monitor your symptoms and call your doctor right away or return to the emergency department if you develop any new or worsening symptoms.  It was my pleasure to care for you today.   Hoover Brunette Jacelyn Grip, MD

## 2022-01-16 ENCOUNTER — Other Ambulatory Visit: Payer: Self-pay | Admitting: Urology

## 2022-01-19 ENCOUNTER — Ambulatory Visit: Payer: Medicare HMO

## 2022-01-19 NOTE — Therapy (Incomplete)
OUTPATIENT PHYSICAL THERAPY TREATMENT NOTE      Patient Name: [name] MRN: [id number] DOB:1945/07/06, 76 y.o., male Today's Date: 01/19/2022  PCP: [name]. [name], [place] PROVIDER: Dr. Gurney Maxin      Past Medical History:  Diagnosis Date   Anxiety    Arteriosclerosis of coronary artery 11/16/2011   Overview:  Stent 10/2011 stent rca 2015 with collaterals to lad which is chronically occluded    Benign enlargement of prostate    Benign essential HTN 06/11/2014   Benign prostatic hypertrophy without urinary obstruction 07/31/2014   Bilateral cataracts 05/16/2013   Overview:  Dr. [facility]     Bone spur of foot    Left   BP (high blood pressure) 11/09/2012   Cancer ([place])    skin (forehead) and bladder   Carotid artery narrowing 02/08/2014   Depression    Detrusor hypertrophy    Diabetes ([place])    Diabetes mellitus, type 2 ([place]) 12/12/2012   Diverticulosis    Dyspnea    Esophageal reflux    Esophageal reflux    Fothergill's neuralgia 08/07/2012   Overview:  [facility]    Gastritis    GERD (gastroesophageal reflux disease)    Headache    cluster headaches   Healed myocardial infarct 11/09/2012   Hearing loss in left ear    Heart disease    Hematuria    Hemorrhoids    History of hiatal hernia 12/14/2017   small    Hypercholesteremia    Lesion of bladder    Myocardial infarct (HCC)    Presence of stent in coronary artery 11/09/2012   Rectal bleeding    Trigeminal neuralgia    Trigeminal neuralgia    Valvular heart disease    Vitamin D deficiency    Past Surgical History:  Procedure Laterality Date   APPENDECTOMY     BOTOX INJECTION N/A 12/21/2017   Procedure: Bladder BOTOX INJECTION;  Surgeon: [name], MD;  Location: [facility];  Service: Urology;  Laterality: N/A;   BOTOX INJECTION N/A 09/11/2018   Procedure: Bladder BOTOX INJECTION;  Surgeon: [name], MD;  Location: [facility];  Service: Urology;  Laterality: N/A;    CARDIAC CATHETERIZATION     CARDIAC CATHETERIZATION N/A 01/22/2015   Procedure: Left Heart Cath;  Surgeon: Corey Skains, MD;  Location: [place] [facility];  Service: Cardiovascular;  Laterality: N/A;   CARDIAC CATHETERIZATION N/A 01/22/2015   Procedure: Coronary Stent Intervention;  Surgeon: Isaias Cowman, MD;  Location: [place] [facility];  Service: Cardiovascular;  Laterality: N/A;   CATARACT EXTRACTION, BILATERAL     COLONOSCOPY WITH PROPOFOL N/A 12/08/2015   Procedure: COLONOSCOPY WITH PROPOFOL;  Surgeon: Lollie Sails, MD;  Location: [facility];  Service: Endoscopy;  Laterality: N/A;   COLONOSCOPY WITH PROPOFOL N/A 12/09/2015   Procedure: COLONOSCOPY WITH PROPOFOL;  Surgeon: Lollie Sails, MD;  Location: [facility] ENDOSCOPY;  Service: Endoscopy;  Laterality: N/A;   CORONARY ANGIOPLASTY     5 stents   CORONARY STENT PLACEMENT  2015   x5   CYSTOSCOPY N/A 09/11/2018   Procedure: CYSTOSCOPY;  Surgeon: [name], MD;  Location: [facility];  Service: Urology;  Laterality: N/A;   CYSTOSCOPY WITH BIOPSY N/A 12/21/2017   Procedure: CYSTOSCOPY WITH BIOPSY;  Surgeon: [name], MD;  Location: [facility];  Service: Urology;  Laterality: N/A;   ESOPHAGOGASTRODUODENOSCOPY (EGD) WITH PROPOFOL N/A 12/08/2015   Procedure: ESOPHAGOGASTRODUODENOSCOPY (EGD) WITH PROPOFOL;  Surgeon: Lollie Sails, MD;  Location: [facility] ENDOSCOPY;  Service: Endoscopy;  Laterality: N/A;   ESOPHAGOGASTRODUODENOSCOPY (EGD) WITH PROPOFOL N/A 12/13/2017   Procedure: ESOPHAGOGASTRODUODENOSCOPY (EGD) WITH PROPOFOL;  Surgeon: Lollie Sails, MD;  Location: [facility] ENDOSCOPY;  Service: Endoscopy;  Laterality: N/A;   EYE SURGERY     HERNIA REPAIR     kidney tumor remove     TRANSURETHRAL RESECTION OF BLADDER TUMOR WITH GYRUS (TURBT-GYRUS)  74/0814   UMBILICAL HERNIA REPAIR     urethral meatotomy     Patient Active Problem List   Diagnosis Date Noted   Class 2 obesity due to excess calories with body  mass index (BMI) of 36.0 to 36.9 in adult 04/11/2020   Swelling of limb 03/28/2020   Lymphedema 03/28/2020   Trigeminal neuralgia 12/25/2019   Lower limb ulcer, calf, left, limited to breakdown of skin ([place]) 12/25/2019   OSA (obstructive sleep apnea) 48/18/5631   Acute systolic CHF (congestive heart failure) ([place]) 12/12/2018   Bruising 07/10/2018   Diet-controlled type 2 diabetes mellitus ([place]) 03/24/2018   Microalbuminuria 03/24/2018   Dizziness 11/17/2017   Simple chronic bronchitis ([place]) 09/22/2017   SOBOE (shortness of breath on exertion) 02/18/2017   Chronic midline low back pain without sciatica 12/29/2016   Periodic limb movement disorder 12/29/2016   Benign essential tremor 06/22/2016   Pure hypercholesterolemia 06/20/2015   Erectile dysfunction due to arterial insufficiency 06/16/2015   Unstable angina ([place]) 01/22/2015   Abdominal aortic 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 rupture ([place]) 12/31/2014   Benign prostatic hypertrophy without urinary obstruction 07/31/2014   Enlarged prostate 07/31/2014   Benign essential HTN 06/11/2014   Carotid artery narrowing 02/08/2014   Carotid artery obstruction 02/08/2014   Bilateral carotid artery stenosis 02/08/2014   Chest pain 08/20/2013   Bilateral cataracts 05/16/2013   Cataract 05/16/2013   Clinical depression 12/12/2012   Diabetes mellitus, type 2 ([place]) 12/12/2012   Combined fat and carbohydrate induced hyperlipemia 12/12/2012   Major depressive disorder, single episode, unspecified 12/12/2012   Acid reflux 11/09/2012   Presence of stent in coronary artery 11/09/2012   BP (high blood pressure) 11/09/2012   Healed myocardial infarct 11/09/2012   Gastro-esophageal reflux disease without esophagitis 11/09/2012   History of cardiovascular surgery 11/09/2012   Fothergill's neuralgia 08/07/2012   Swelling of testicle 04/06/2012   Disorder of male genital organ 04/06/2012   Swelling of the testicles 04/06/2012   Arteriosclerosis of coronary  artery 11/16/2011   CAD in native artery 11/16/2011   Fatigue 06/04/2011   Avitaminosis D 11/30/2010    REFERRING DIAG: Repeated falls   THERAPY DIAG:   No diagnosis found.  Rationale for Evaluation and Treatment Rehabilitation   Subjective Assessment - 09/22/21 0936     Subjective Pt reports moving some better today than earlier this week. He denies any falls or pain today.   Patient is accompained by: Family member -Wife   Pertinent History Patient is a 76 year old male with referral for Physical Therapy with diagnosis of Parkinsons disease and history of falling. He reports he diagnosed last year with Parkinsons and has improved his tremors with use of medication but reports worsening shuffling and difficulty with mobility. Patient has past medical history signifiant for Parkinsons; Arthritis, bladder cancer, Trigemic Neuralgia. He lives with wife in 1 level home and current using cane with reported recent fall.    Limitations Walking;Lifting;Standing;House hold activities    How long can you sit comfortably? No limits    How long can you stand comfortably? 15 min- limited due  to back pain and foot numbess    How long can you walk comfortably? about 80 feet    Patient Stated Goals Improve my balance, be able to get up off the floor so I can maybe work in my garage again.    Currently in Pain? No/denies               PRECAUTIONS: Falls   PAIN:   No pain reported.     TODAY'S TREATMENT: 01/14/2022   There.ex:   NuStep INTERVAL Training L1-5 for 7 min for improved LE strength, coordination and endurance training. Total time = 7 min and 0.24 mi  Seated pectoral/Sh ER stretch using PVC pipe Seated overhead raises using 1in PVC pipe.  Seated lumbar flex stretch- rolling silver theraball diagonally forward to left and right x 20 reps each Standing heel to toe gait sequencing using 2.5 # 2 set of 15 reps each LE- Patient reported as fatiguing.  Retro gait- 3# AW donned-  VC to take large steps back for posterior strengthening Ambulation around the gym with  x2 laps total using upright 4WW and use of metronome at 62 to attempt to keep him moving at a constant cadence with good results as he was mostly compliant with step cadence stating he did feel like the sound helped him stay in rhythm.          PATIENT EDUCATION: Education details: form/technique with exercise Person educated: Patient Education method: Explanation, Demonstration, Tactile cues, and Verbal cues Education comprehension: verbalized understanding, returned demonstration, verbal cues required, tactile cues required, and needs further education   HOME EXERCISE PROGRAM: No updates today   PT Short Term Goals -       PT SHORT TERM GOAL #1   Title Pt will be independent with INITIAL  HEP in order to improve strength and balance in order to decrease fall risk and improve function at home and work.    Baseline 04/07/2021= No formal HEP in place. 05/11/2021= Patient reports doing pretty good- compliant with walking program and his stretching/strengthening.    Time 6    Period Weeks    Status Achieved    Target Date 05/19/21      PT SHORT TERM GOAL #2   Title Pt will decrease 5TSTS by at least 3 seconds in order to demonstrate clinically significant improvement in LE strength.    Baseline 04/07/2021= 25 sec; 05/11/2021= 18.65 sec    Time 6    Period Weeks    Status Achieved    Target Date 06/30/21              PT Long Term Goals -       PT LONG TERM GOAL #1   Title Pt will be independent with FINAL HEP in order to improve strength and balance in order to decrease fall risk and improve function at home and work.    Baseline 04/07/2021= No formal HEP in place. 07/01/2021=Patient reports performing walking and some standing LE strengthening exercises as instructed and states no questions at this time.  09/02/21: Pt reports he feels indep with exercises but is not performing HEP as much as he  should; 11/17/2021= Patient reports compliant with current HEP- "I do them as best I can." 12/15/2021- Patient reports doing some exercises and walking but not feeling well in past week and denied performing HEP. Will keep goal active to ensure compliance and update HEP as appropriate.    Time 12    Period  Weeks    Status Partially Met    Target Date 03/09/2022     PT LONG TERM GOAL #2   Title Pt will improve FOTO to target score of 45% to display perceived improvements in ability to complete ADL's.    Baseline 2/7/22023= 41%; FOTO=53%    Time 12    Period Weeks    Status Achieved    Target Date 06/30/21      PT LONG TERM GOAL #3   Title Pt will decrease 5TSTS by at least 5 seconds in order to demonstrate clinically significant improvement in LE strength.    Baseline 04/07/2021= 25 sec; 05/11/2021= 18.65 sec without UE support; 4/10= 13.76 sec without UE support    Time 12    Period Weeks    Status Achieved    Target Date 06/30/21      PT LONG TERM GOAL #4   Title Pt will decrease TUG to below 17 seconds/decrease in order to demonstrate decreased fall risk.    Baseline 04/07/2021= 21 sec with SPC; 05/11/2021= 18.85 sec with use of cane; 07/21/2021=18.88 sec  avg with use of cane; 7/5: 22.85 sec with QC; 09/29/2021= 21.45 sec using upright walker; 11/17/2021= 20. 46 sec using upright walker; 10/17= 24 sec with upright walker   Time 12    Period Weeks    Status Partially Met    Target Date 03/09/2022     PT LONG TERM GOAL #5   Title Pt will increase 10MWT by at least 0.15 m/s in order to demonstrate clinically significant improvement in community ambulation.    Baseline 04/07/2021= 0.56 m/s using SPC; 05/11/2021= 0.54 m/s using SPC; 07/01/2021= 0.6 m/s/ 7/5: 0.48 m/s without AD but with CGA; 09/29/2021= 0.64 m/s; 11/17/2021= 0.61 m/s using upright 4WW; 10/17= 0.59 m/s; 12/29/2021= 0.64 m/s   Time 12    Period Weeks    Status On-going    Target Date 03/09/2022     PT LONG TERM GOAL #6   Title Pt will  increase 6MWT by at least 29m(1684f in order to demonstrate clinically significant improvement in cardiopulmonary endurance and community ambulation    Baseline 06/08/2021= 600 ft. in 5 min- Stopped due to fatigue using 4WW; 07/01/2021= 665 in 5 min 45 sec using 4WW; 7/5: 367 ft with QC; 09/29/2021= 740 feet using upright 4WW; 11/17/2021= 705 feet using upright 4WW; 12/15/2021= 680 feet with upright 4WW   Time 12   Period Weeks    Status On-going    Target Date 03/09/2022              Plan - 08/20/21 0939     Clinical Impression Statement Patient presented with good motivation for today's activities. He was able to complete some ROM activities and demo improved flexibility in chest/shoulders/trunk after activities. He ambulated with more consistent step cadence with use of metronome today and may benefit from further trial to see if consistently helps. Pt will continue to benefit from skilled therapy to address remaining deficits in order to improve overall QoL and return to PLOF.        Personal Factors and Comorbidities Comorbidity 3+    Comorbidities arthritis, bladder cancer, Trigeminal Neuralgia    Examination-Activity Limitations Caring for Others;Carry;Continence;Lift;Squat;Stairs;Stand    Examination-Participation Restrictions Community Activity;Yard Work    [employer] officervolving/Moderate complexity    Rehab Potential Good    PT Frequency 2x / week    PT Duration 12 weeks    PT Treatment/Interventions ADLs/Self Care  Home Management;Cryotherapy;Canalith Repostioning;Electrical Stimulation;Moist Heat;DME Instruction;Gait training;Stair training;Functional mobility training;Therapeutic activities;Therapeutic exercise;Balance training;Neuromuscular re-education;Patient/family education;Manual techniques;Passive range of motion;Dry needling;Vestibular    PT Next Visit Plan LE strength, improve step length with ambulation, muscle tissue lengthening    PT Home Exercise  Plan Access Code: [place], PT Physical Therapist- [facility]  01/19/22, 5:20 AM

## 2022-01-25 ENCOUNTER — Emergency Department: Payer: Medicare HMO

## 2022-01-25 ENCOUNTER — Emergency Department
Admission: EM | Admit: 2022-01-25 | Discharge: 2022-01-26 | Discharge status: Against Medical Advice | Payer: Medicare HMO | Attending: Student in an Organized Health Care Education/Training Program | Admitting: Student in an Organized Health Care Education/Training Program

## 2022-01-25 ENCOUNTER — Encounter: Payer: Self-pay | Admitting: *Deleted

## 2022-01-25 ENCOUNTER — Other Ambulatory Visit: Payer: Self-pay

## 2022-01-25 DIAGNOSIS — Z5329 Procedure and treatment not carried out because of patient's decision for other reasons: Secondary | ICD-10-CM | POA: Diagnosis not present

## 2022-01-25 DIAGNOSIS — K5900 Constipation, unspecified: Secondary | ICD-10-CM | POA: Diagnosis not present

## 2022-01-25 DIAGNOSIS — R1031 Right lower quadrant pain: Secondary | ICD-10-CM | POA: Diagnosis present

## 2022-01-25 DIAGNOSIS — R1084 Generalized abdominal pain: Secondary | ICD-10-CM

## 2022-01-25 LAB — URINALYSIS, ROUTINE W REFLEX MICROSCOPIC
Bacteria, UA: NONE SEEN
Bilirubin Urine: NEGATIVE
Glucose, UA: NEGATIVE mg/dL
Hgb urine dipstick: NEGATIVE
Ketones, ur: 5 mg/dL — AB
Nitrite: NEGATIVE
Protein, ur: NEGATIVE mg/dL
Specific Gravity, Urine: 1.023 (ref 1.005–1.030)
pH: 5 (ref 5.0–8.0)

## 2022-01-25 LAB — COMPREHENSIVE METABOLIC PANEL
ALT: <5 U/L (ref 0–44)
AST: 15 U/L (ref 15–41)
Albumin: 4 g/dL (ref 3.5–5.0)
Alkaline Phosphatase: 60 U/L (ref 38–126)
Anion gap: 10 (ref 5–15)
BUN: 22 mg/dL (ref 8–23)
CO2: 22 mmol/L (ref 22–32)
Calcium: 8.9 mg/dL (ref 8.9–10.3)
Chloride: 104 mmol/L (ref 98–111)
Creatinine, Ser: 1.13 mg/dL (ref 0.61–1.24)
GFR, Estimated: >60 mL/min (ref >60)
Glucose, Bld: 114 mg/dL — ABNORMAL HIGH (ref 70–99)
Potassium: 4.7 mmol/L (ref 3.5–5.1)
Sodium: 136 mmol/L (ref 135–145)
Total Bilirubin: 1 mg/dL (ref 0.3–1.2)
Total Protein: 7.7 g/dL (ref 6.5–8.1)

## 2022-01-25 LAB — CBC
HCT: 47.2 % (ref 39.0–52.0)
Hemoglobin: 15.7 g/dL (ref 13.0–17.0)
MCH: 30 pg (ref 26.0–34.0)
MCHC: 33.3 g/dL (ref 30.0–36.0)
MCV: 90.1 fL (ref 80.0–100.0)
Platelets: 199 10*3/uL (ref 150–400)
RBC: 5.24 MIL/uL (ref 4.22–5.81)
RDW: 13 % (ref 11.5–15.5)
WBC: 8.3 10*3/uL (ref 4.0–10.5)
nRBC: 0 % (ref 0.0–0.2)

## 2022-01-25 LAB — LIPASE, BLOOD: Lipase: 27 U/L (ref 11–51)

## 2022-01-25 MED ORDER — SORBITOL 70 % SOLN
960.0000 mL | TOPICAL_OIL | Freq: Once | ORAL | Status: DC
  Filled 2022-01-25: qty 240

## 2022-01-25 MED ORDER — IOHEXOL 350 MG/ML SOLN
80.0000 mL | Freq: Once | INTRAVENOUS | Status: AC | PRN
  Administered 2022-01-25: 80 mL via INTRAVENOUS

## 2022-01-25 MED ORDER — LACTULOSE 10 GM/15ML PO SOLN
30.0000 g | Freq: Once | ORAL | Status: AC
  Administered 2022-01-26: 30 g via ORAL
  Filled 2022-01-25: qty 60

## 2022-01-25 MED ORDER — CARBIDOPA-LEVODOPA 25-100 MG PO TABS
2.0000 | ORAL_TABLET | Freq: Three times a day (TID) | ORAL | Status: DC
  Administered 2022-01-26: 2 via ORAL
  Filled 2022-01-25: qty 2

## 2022-01-25 MED ORDER — LIDOCAINE HCL URETHRAL/MUCOSAL 2 % EX GEL: 1.0000 | Freq: Once | CUTANEOUS | Status: DC

## 2022-01-25 NOTE — ED Notes (Signed)
SMOG enema requested from pharmacy at this time.

## 2022-01-25 NOTE — ED Provider Triage Note (Cosign Needed)
Emergency Medicine Provider Triage Evaluation Note  [name] , a 76 y.o. male  was evaluated in triage.  With a history of diabetes, hypercholesterolemia, prior MI, GERD, presents to the emergency department with concern for constipation as patient has not had a bowel movement for 2 weeks and has some generalized abdominal discomfort.  No chest pain, chest tightness or shortness of breath.  Review of Systems  Positive: Patient has abdominal pain.  Negative: No chest pain or chest tightness.  Physical Exam  BP (!) 121/98 (BP Location: Left Arm)   Pulse 69   Temp 98 F (36.7 C) (Oral)   Resp 18   Ht '5\' 7"'$  (1.702 m)   Wt 101.2 kg   SpO2 95%   BMI 34.93 kg/m  Gen:   Awake, no distress   Resp:  Normal effort  MSK:   Moves extremities without difficulty  Other:    Medical Decision Making  Medically screening exam initiated at 6:00 PM.  Appropriate orders placed.  [name] was informed that the remainder of the evaluation will be completed by another provider, this initial triage assessment does not replace that evaluation, and the importance of remaining in the ED until their evaluation is complete.     Vallarie Mare [place], [place] 01/25/22 1802

## 2022-01-25 NOTE — ED Notes (Signed)
First Nurse Note: Patient sent to ED by [facility]. Patient has been constipated for the past 3 weeks with the last BM approx 2 weeks ago. Sent by doctor with concern for bowel blockage.

## 2022-01-25 NOTE — ED Provider Notes (Signed)
[facility] Provider Note    Event Date/Time   First MD Initiated Contact with Patient 01/25/22 2055     (approximate)   History   Constipation and Abdominal Pain   HPI  [name] is a 76 y.o. male to ER for evaluation 2 weeks of progressive worsening abdominal pain and lack of bowel movement.  He is not passing gas.  Is been taking laxatives without improvement.  Does have some right lower quadrant pain.  No measured fevers.  No nausea or vomiting.     Physical Exam   Triage Vital Signs: ED Triage Vitals  Enc Vitals Group     BP 01/25/22 1754 (!) 121/98     Pulse Rate 01/25/22 1754 69     Resp 01/25/22 1754 18     Temp 01/25/22 1754 98 F (36.7 C)     Temp Source 01/25/22 1754 Oral     SpO2 01/25/22 1754 95 %     Weight 01/25/22 1755 223 lb (101.2 kg)     Height 01/25/22 1755 '5\' 7"'$  (1.702 m)     Head Circumference --      Peak Flow --      Pain Score 01/25/22 1755 5     Pain Loc --      Pain Edu? --      Excl. in [place]? --     Most recent vital signs: Vitals:   01/25/22 1754 01/25/22 2109  BP: (!) 121/98 (!) 152/94  Pulse: 69 63  Resp: 18 16  Temp: 98 F (36.7 C) 97.7 F (36.5 C)  SpO2: 95% 94%     Constitutional: Alert  Eyes: Conjunctivae are normal.  Head: Atraumatic. Nose: No congestion/rhinnorhea. Mouth/Throat: Mucous membranes are moist.   Neck: Painless ROM.  Cardiovascular:   Good peripheral circulation. Respiratory: Normal respiratory effort.  No retractions.  Gastrointestinal: Soft and nontender.  Musculoskeletal:  no deformity Neurologic:  MAE spontaneously. No gross focal neurologic deficits are appreciated.  Skin:  Skin is warm, dry and intact. No rash noted. Psychiatric: Mood and affect are normal. Speech and behavior are normal.    ED Results / Procedures / Treatments   Labs (all labs ordered are listed, but only abnormal results are displayed) Labs Reviewed  COMPREHENSIVE METABOLIC PANEL - Abnormal;  Notable for the following components:      Result Value   Glucose, Bld 114 (*)    All other components within normal limits  URINALYSIS, ROUTINE W REFLEX MICROSCOPIC - Abnormal; Notable for the following components:   Color, Urine AMBER (*)    APPearance HAZY (*)    Ketones, ur 5 (*)    Leukocytes,Ua TRACE (*)    All other components within normal limits  LIPASE, BLOOD  CBC     EKG     RADIOLOGY Please see ED Course for my review and interpretation.  I personally reviewed all radiographic images ordered to evaluate for the above acute complaints and reviewed radiology reports and findings.  These findings were personally discussed with the patient.  Please see medical record for radiology report.    PROCEDURES:  Critical Care performed:   Procedures   MEDICATIONS ORDERED IN ED: Medications  sorbitol, milk of mag, mineral oil, glycerin (SMOG) enema (has no administration in time range)  lactulose (CHRONULAC) 10 GM/15ML solution 30 g (has no administration in time range)  lidocaine (XYLOCAINE) 2 % jelly 1 Application (has no administration in time range)  carbidopa-levodopa (SINEMET IR)  25-100 MG per tablet immediate release 2 tablet (has no administration in time range)  iohexol (OMNIPAQUE) 350 MG/ML injection 80 mL (80 mLs Intravenous Contrast Given 01/25/22 2128)     IMPRESSION / MDM / ASSESSMENT AND PLAN / ED COURSE  I reviewed the triage vital signs and the nursing notes.                              Differential diagnosis includes, but is not limited to, obstruction, volvulus, fecal impaction, diverticulitis, mass, colitis, perforation   Patient presenting to the ER for evaluation of symptoms as described above.  Based on symptoms, risk factors and considered above differential, this presenting complaint could reflect a potentially life-threatening illness therefore the patient will be placed on continuous pulse oximetry and telemetry for monitoring.   Laboratory evaluation will be sent to evaluate for the above complaints.  CT imaigng ordered for the above differntial.    Clinical Course as of 01/25/22 2327  Mon Jan 25, 2022  2207 CT imaging on my review interpretation does not show any evidence of perforation does show large stool volume and fecal impaction. [PR]  2300 Disimpaction attempted.  Patient has soft formed stool but to proximal for digital disimpaction.  Enema has been ordered. [PR]  2325 Patient will be signed out to oncoming physician pending follow-up reassessment after enema. [PR]    Clinical Course User Index [PR] Merlyn Lot, MD      FINAL CLINICAL IMPRESSION(S) / ED DIAGNOSES   Final diagnoses:  Generalized abdominal pain  Constipation, unspecified constipation type     Rx / DC Orders   ED Discharge Orders     None        Note:  This document was prepared using Dragon voice recognition software and may include unintentional dictation errors.    Merlyn Lot, MD 01/25/22 [phone]

## 2022-01-25 NOTE — ED Triage Notes (Addendum)
Pt reports no BM for 2 weeks.  Pt taking otc meds without relief.  Pt has abd pain.  No vomiting.   Pt alert  speech clear.   Pt sent from his doctor for eval.

## 2022-01-26 ENCOUNTER — Ambulatory Visit: Payer: Medicare HMO

## 2022-01-26 NOTE — ED Notes (Signed)
Pt signed ama form  iv dc'ed.  Pt alert.

## 2022-01-26 NOTE — ED Notes (Addendum)
Pt left.  States I have to go home  md aware   ama gform signed by wife.  Iv dc'ed.

## 2022-01-26 NOTE — ED Provider Notes (Signed)
Patient signed out to me pending a smog enema.  I was informed by nursing that patient requested to be discharged and signed out AMA.  I did not speak with the patient.   Rada Hay, MD 01/26/22 [phone]

## 2022-01-28 ENCOUNTER — Ambulatory Visit: Payer: Medicare HMO

## 2022-01-28 DIAGNOSIS — M6281 Muscle weakness (generalized): Secondary | ICD-10-CM

## 2022-01-28 DIAGNOSIS — R2681 Unsteadiness on feet: Secondary | ICD-10-CM

## 2022-01-28 DIAGNOSIS — G8929 Other chronic pain: Secondary | ICD-10-CM

## 2022-01-28 DIAGNOSIS — R2689 Other abnormalities of gait and mobility: Secondary | ICD-10-CM

## 2022-01-28 DIAGNOSIS — R262 Difficulty in walking, not elsewhere classified: Secondary | ICD-10-CM

## 2022-01-28 DIAGNOSIS — R278 Other lack of coordination: Secondary | ICD-10-CM

## 2022-01-28 DIAGNOSIS — R269 Unspecified abnormalities of gait and mobility: Secondary | ICD-10-CM

## 2022-01-28 NOTE — Therapy (Signed)
OUTPATIENT PHYSICAL THERAPY TREATMENT NOTE      Patient Name: [name] MRN: [id number] DOB:[birth year], 76 y.o., male Today's Date: 01/28/2022  PCP: [name]. [name], [place] PROVIDER: Dr. Gurney Maxin   PT End of Session - 01/28/22 0926     Visit Number 79    Number of Visits 100    Date for PT Re-Evaluation 03/09/22    Authorization Type [employer]    Authorization Time Period 10/02/39-9/62/22; recert 97/98/9211- 11/03/1738    Progress Note Due on Visit 80    PT Start Time 0928    PT Stop Time 1011    PT Time Calculation (min) 43 min    Equipment Utilized During Treatment Gait belt    Activity Tolerance Patient tolerated treatment well;No increased pain    Behavior During Therapy [facility] for tasks assessed/performed               Past Medical History:  Diagnosis Date   Anxiety    Arteriosclerosis of coronary artery 11/16/2011   Overview:  Stent 10/2011 stent rca 2015 with collaterals to lad which is chronically occluded    Benign enlargement of prostate    Benign essential HTN 06/11/2014   Benign prostatic hypertrophy without urinary obstruction 07/31/2014   Bilateral cataracts 05/16/2013   Overview:  Dr. [facility]     Bone spur of foot    Left   BP (high blood pressure) 11/09/2012   Cancer ([place])    skin (forehead) and bladder   Carotid artery narrowing 02/08/2014   Depression    Detrusor hypertrophy    Diabetes ([place])    Diabetes mellitus, type 2 ([place]) 12/12/2012   Diverticulosis    Dyspnea    Esophageal reflux    Esophageal reflux    Fothergill's neuralgia 08/07/2012   Overview:  [facility]    Gastritis    GERD (gastroesophageal reflux disease)    Headache    cluster headaches   Healed myocardial infarct 11/09/2012   Hearing loss in left ear    Heart disease    Hematuria    Hemorrhoids    History of hiatal hernia 12/14/2017   small    Hypercholesteremia    Lesion of bladder    Myocardial infarct (HCC)    Presence of stent in  coronary artery 11/09/2012   Rectal bleeding    Trigeminal neuralgia    Trigeminal neuralgia    Valvular heart disease    Vitamin D deficiency    Past Surgical History:  Procedure Laterality Date   APPENDECTOMY     BOTOX INJECTION N/A 12/21/2017   Procedure: Bladder BOTOX INJECTION;  Surgeon: [name], MD;  Location: [facility] ORS;  Service: Urology;  Laterality: N/A;   BOTOX INJECTION N/A 09/11/2018   Procedure: Bladder BOTOX INJECTION;  Surgeon: [name], MD;  Location: [facility] ORS;  Service: Urology;  Laterality: N/A;   CARDIAC CATHETERIZATION     CARDIAC CATHETERIZATION N/A 01/22/2015   Procedure: Left Heart Cath;  Surgeon: Corey Skains, MD;  Location: [place] CV LAB;  Service: Cardiovascular;  Laterality: N/A;   CARDIAC CATHETERIZATION N/A 01/22/2015   Procedure: Coronary Stent Intervention;  Surgeon: Isaias Cowman, MD;  Location: [place] [facility] LAB;  Service: Cardiovascular;  Laterality: N/A;   CATARACT EXTRACTION, BILATERAL     COLONOSCOPY WITH PROPOFOL N/A 12/08/2015   Procedure: COLONOSCOPY WITH PROPOFOL;  Surgeon: Lollie Sails, MD;  Location[facility] ENDOSCOPY;  Service: Endoscopy;  Laterality: N/A;   COLONOSCOPY WITH  PROPOFOL N/A 12/09/2015   Procedure: COLONOSCOPY WITH PROPOFOL;  Surgeon: Lollie Sails, MD;  Location: [facility] ENDOSCOPY;  Service: Endoscopy;  Laterality: N/A;   CORONARY ANGIOPLASTY     5 stents   CORONARY STENT PLACEMENT  2015   x5   CYSTOSCOPY N/A 09/11/2018   Procedure: CYSTOSCOPY;  Surgeon: [name], MD;  Location: [facility] ORS;  Service: Urology;  Laterality: N/A;   CYSTOSCOPY WITH BIOPSY N/A 12/21/2017   Procedure: CYSTOSCOPY WITH BIOPSY;  Surgeon: [name], MD;  Location: [facility] ORS;  Service: Urology;  Laterality: N/A;   ESOPHAGOGASTRODUODENOSCOPY (EGD) WITH PROPOFOL N/A 12/08/2015   Procedure: ESOPHAGOGASTRODUODENOSCOPY (EGD) WITH PROPOFOL;  Surgeon: Lollie Sails, MD;  Location: [facility] ENDOSCOPY;  Service:  Endoscopy;  Laterality: N/A;   ESOPHAGOGASTRODUODENOSCOPY (EGD) WITH PROPOFOL N/A 12/13/2017   Procedure: ESOPHAGOGASTRODUODENOSCOPY (EGD) WITH PROPOFOL;  Surgeon: Lollie Sails, MD;  Location: [facility] ENDOSCOPY;  Service: Endoscopy;  Laterality: N/A;   EYE SURGERY     HERNIA REPAIR     kidney tumor remove     TRANSURETHRAL RESECTION OF BLADDER TUMOR WITH GYRUS (TURBT-GYRUS)  66/2947   UMBILICAL HERNIA REPAIR     urethral meatotomy     Patient Active Problem List   Diagnosis Date Noted   Class 2 obesity due to excess calories with body mass index (BMI) of 36.0 to 36.9 in adult 04/11/2020   Swelling of limb 03/28/2020   Lymphedema 03/28/2020   Trigeminal neuralgia 12/25/2019   Lower limb ulcer, calf, left, limited to breakdown of skin ([place]) 12/25/2019   OSA (obstructive sleep apnea) 65/46/5035   Acute systolic CHF (congestive heart failure) ([place]) 12/12/2018   Bruising 07/10/2018   Diet-controlled type 2 diabetes mellitus ([place]) 03/24/2018   Microalbuminuria 03/24/2018   Dizziness 11/17/2017   Simple chronic bronchitis ([place]) 09/22/2017   SOBOE (shortness of breath on exertion) 02/18/2017   Chronic midline low back pain without sciatica 12/29/2016   Periodic limb movement disorder 12/29/2016   Benign essential tremor 06/22/2016   Pure hypercholesterolemia 06/20/2015   Erectile dysfunction due to arterial insufficiency 06/16/2015   Unstable angina ([place]) 01/22/2015   Abdominal aortic 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 rupture ([place]) 12/31/2014   Benign prostatic hypertrophy without urinary obstruction 07/31/2014   Enlarged prostate 07/31/2014   Benign essential HTN 06/11/2014   Carotid artery narrowing 02/08/2014   Carotid artery obstruction 02/08/2014   Bilateral carotid artery stenosis 02/08/2014   Chest pain 08/20/2013   Bilateral cataracts 05/16/2013   Cataract 05/16/2013   Clinical depression 12/12/2012   Diabetes mellitus, type 2 ([place]) 12/12/2012   Combined fat and carbohydrate  induced hyperlipemia 12/12/2012   Major depressive disorder, single episode, unspecified 12/12/2012   Acid reflux 11/09/2012   Presence of stent in coronary artery 11/09/2012   BP (high blood pressure) 11/09/2012   Healed myocardial infarct 11/09/2012   Gastro-esophageal reflux disease without esophagitis 11/09/2012   History of cardiovascular surgery 11/09/2012   Fothergill's neuralgia 08/07/2012   Swelling of testicle 04/06/2012   Disorder of male genital organ 04/06/2012   Swelling of the testicles 04/06/2012   Arteriosclerosis of coronary artery 11/16/2011   CAD in native artery 11/16/2011   Fatigue 06/04/2011   Avitaminosis D 11/30/2010    REFERRING DIAG: Repeated falls   THERAPY DIAG:   Difficulty in walking, not elsewhere classified  Muscle weakness (generalized)  Other lack of coordination  Abnormality of gait and mobility  Other abnormalities of gait and mobility  Unsteadiness on feet  Chronic bilateral low back pain without  sciatica  Rationale for Evaluation and Treatment Rehabilitation   Subjective Assessment - 09/22/21 0936     Subjective Patient reports he has really been through it. Been in the ED twice since last visit- one for edema in his leg and once for backed up bowels.    Patient is accompained by: Family member -Wife   Pertinent History Patient is a 76 year old male with referral for Physical Therapy with diagnosis of Parkinsons disease and history of falling. He reports he diagnosed last year with Parkinsons and has improved his tremors with use of medication but reports worsening shuffling and difficulty with mobility. Patient has past medical history signifiant for Parkinsons; Arthritis, bladder cancer, Trigemic Neuralgia. He lives with wife in 1 level home and current using cane with reported recent fall.    Limitations Walking;Lifting;Standing;House hold activities    How long can you sit comfortably? No limits    How long can you stand  comfortably? 15 min- limited due to back pain and foot numbess    How long can you walk comfortably? about 80 feet    Patient Stated Goals Improve my balance, be able to get up off the floor so I can maybe work in my garage again.    Currently in Pain? No/denies               PRECAUTIONS: Falls   PAIN:   No pain reported.     TODAY'S TREATMENT: 01/28/2022   There.ex:   NuStep INTERVAL Training L1-5 for 7 min for improved LE strength, coordination and endurance training. Total time = 6 min and 0.20 mi  Standing high knee walk in // bars (VC for knees to go as high as bar)- down and back x 4. Standing ham curl walk in // bars x 4 Standing toe walk (heels off floor) - in // bars - down and back x 4 Retro walk in // bars - down and back x 4 Side step walk in // bars - down and back x 4           PATIENT EDUCATION: Education details: form/technique with exercise Person educated: Patient Education method: Explanation, Demonstration, Tactile cues, and Verbal cues Education comprehension: verbalized understanding, returned demonstration, verbal cues required, tactile cues required, and needs further education   HOME EXERCISE PROGRAM: No updates today   PT Short Term Goals -       PT SHORT TERM GOAL #1   Title Pt will be independent with INITIAL  HEP in order to improve strength and balance in order to decrease fall risk and improve function at home and work.    Baseline 04/07/2021= No formal HEP in place. 05/11/2021= Patient reports doing pretty good- compliant with walking program and his stretching/strengthening.    Time 6    Period Weeks    Status Achieved    Target Date 05/19/21      PT SHORT TERM GOAL #2   Title Pt will decrease 5TSTS by at least 3 seconds in order to demonstrate clinically significant improvement in LE strength.    Baseline 04/07/2021= 25 sec; 05/11/2021= 18.65 sec    Time 6    Period Weeks    Status Achieved    Target Date 06/30/21               PT Long Term Goals -       PT LONG TERM GOAL #1   Title Pt will be independent with FINAL HEP in order  to improve strength and balance in order to decrease fall risk and improve function at home and work.    Baseline 04/07/2021= No formal HEP in place. 07/01/2021=Patient reports performing walking and some standing LE strengthening exercises as instructed and states no questions at this time.  09/02/21: Pt reports he feels indep with exercises but is not performing HEP as much as he should; 11/17/2021= Patient reports compliant with current HEP- "I do them as best I can." 12/15/2021- Patient reports doing some exercises and walking but not feeling well in past week and denied performing HEP. Will keep goal active to ensure compliance and update HEP as appropriate.    Time 12    Period Weeks    Status Partially Met    Target Date 03/09/2022     PT LONG TERM GOAL #2   Title Pt will improve FOTO to target score of 45% to display perceived improvements in ability to complete ADL's.    Baseline 2/7/22023= 41%; FOTO=53%    Time 12    Period Weeks    Status Achieved    Target Date 06/30/21      PT LONG TERM GOAL #3   Title Pt will decrease 5TSTS by at least 5 seconds in order to demonstrate clinically significant improvement in LE strength.    Baseline 04/07/2021= 25 sec; 05/11/2021= 18.65 sec without UE support; 4/10= 13.76 sec without UE support    Time 12    Period Weeks    Status Achieved    Target Date 06/30/21      PT LONG TERM GOAL #4   Title Pt will decrease TUG to below 17 seconds/decrease in order to demonstrate decreased fall risk.    Baseline 04/07/2021= 21 sec with SPC; 05/11/2021= 18.85 sec with use of cane; 07/21/2021=18.88 sec  avg with use of cane; 7/5: 22.85 sec with QC; 09/29/2021= 21.45 sec using upright walker; 11/17/2021= 20. 46 sec using upright walker; 10/17= 24 sec with upright walker   Time 12    Period Weeks    Status Partially Met    Target Date 03/09/2022     PT LONG  TERM GOAL #5   Title Pt will increase 10MWT by at least 0.15 m/s in order to demonstrate clinically significant improvement in community ambulation.    Baseline 04/07/2021= 0.56 m/s using SPC; 05/11/2021= 0.54 m/s using SPC; 07/01/2021= 0.6 m/s/ 7/5: 0.48 m/s without AD but with CGA; 09/29/2021= 0.64 m/s; 11/17/2021= 0.61 m/s using upright 4WW; 10/17= 0.59 m/s; 12/29/2021= 0.64 m/s   Time 12    Period Weeks    Status On-going    Target Date 03/09/2022     PT LONG TERM GOAL #6   Title Pt will increase 6MWT by at least 35m(1680f in order to demonstrate clinically significant improvement in cardiopulmonary endurance and community ambulation    Baseline 06/08/2021= 600 ft. in 5 min- Stopped due to fatigue using 4WW; 07/01/2021= 665 in 5 min 45 sec using 4WW; 7/5: 367 ft with QC; 09/29/2021= 740 feet using upright 4WW; 11/17/2021= 705 feet using upright 4WW; 12/15/2021= 680 feet with upright 4WW   Time 12   Period Weeks    Status On-going    Target Date 03/09/2022              Plan - 08/20/21 0939     Clinical Impression Statement Patient returns today after missing last week and earlier this week with 2 trips to ED. He presents with increased LE weakness and  overall fatigue. Patient performed well with rest in between. He was able to follow all cues but obviously more fatigued. Pt will continue to benefit from skilled therapy to address remaining deficits in order to improve overall QoL and return to PLOF.        Personal Factors and Comorbidities Comorbidity 3+    Comorbidities arthritis, bladder cancer, Trigeminal Neuralgia    Examination-Activity Limitations Caring for Others;Carry;Continence;Lift;Squat;Stairs;Stand    Examination-Participation Restrictions Community Activity;Yard Work    [employer] Evolving/Moderate complexity    Rehab Potential Good    PT Frequency 2x / week    PT Duration 12 weeks    PT Treatment/Interventions ADLs/Self Care Home  Management;Cryotherapy;Canalith Repostioning;Electrical Stimulation;Moist Heat;DME Instruction;Gait training;Stair training;Functional mobility training;Therapeutic activities;Therapeutic exercise;Balance training;Neuromuscular re-education;Patient/family education;Manual techniques;Passive range of motion;Dry needling;Vestibular    PT Next Visit Plan LE strength, improve step length with ambulation, muscle tissue lengthening    PT Home Exercise Plan Access Code: [place], PT Physical Therapist- [facility]  01/28/22, 10:17 AM

## 2022-02-01 NOTE — Therapy (Signed)
OUTPATIENT PHYSICAL THERAPY TREATMENT NOTE      Patient Name: [name] MRN: [id number] DOB:1945-04-08, 76 y.o., male Today's Date: 02/02/2022  PCP: [name]. [name], FNP REFERRING PROVIDER: Dr. Gurney Maxin   PT End of Session - 02/02/22 0939     Visit Number 74    Number of Visits 90    Date for PT Re-Evaluation 03/09/22    Authorization Type [employer]    Authorization Time Period 09/06/22-2/35/36; recert 14/43/1540- 0/09/6759    Progress Note Due on Visit 80    PT Start Time 0933    PT Stop Time 1013    PT Time Calculation (min) 40 min    Equipment Utilized During Treatment Gait belt    Activity Tolerance Patient tolerated treatment well;No increased pain    Behavior During Therapy [facility] for tasks assessed/performed                Past Medical History:  Diagnosis Date   Anxiety    Arteriosclerosis of coronary artery 11/16/2011   Overview:  Stent 10/2011 stent rca 2015 with collaterals to lad which is chronically occluded    Benign enlargement of prostate    Benign essential HTN 06/11/2014   Benign prostatic hypertrophy without urinary obstruction 07/31/2014   Bilateral cataracts 05/16/2013   Overview:  Dr. [facility]     Bone spur of foot    Left   BP (high blood pressure) 11/09/2012   Cancer ([place])    skin (forehead) and bladder   Carotid artery narrowing 02/08/2014   Depression    Detrusor hypertrophy    Diabetes ([place])    Diabetes mellitus, type 2 ([place]) 12/12/2012   Diverticulosis    Dyspnea    Esophageal reflux    Esophageal reflux    Fothergill's neuralgia 08/07/2012   Overview:  [facility]    Gastritis    GERD (gastroesophageal reflux disease)    Headache    cluster headaches   Healed myocardial infarct 11/09/2012   Hearing loss in left ear    Heart disease    Hematuria    Hemorrhoids    History of hiatal hernia 12/14/2017   small    Hypercholesteremia    Lesion of bladder    Myocardial infarct (HCC)    Presence of stent in  coronary artery 11/09/2012   Rectal bleeding    Trigeminal neuralgia    Trigeminal neuralgia    Valvular heart disease    Vitamin D deficiency    Past Surgical History:  Procedure Laterality Date   APPENDECTOMY     BOTOX INJECTION N/A 12/21/2017   Procedure: Bladder BOTOX INJECTION;  Surgeon: [name], MD;  Location: [facility] ORS;  Service: Urology;  Laterality: N/A;   BOTOX INJECTION N/A 09/11/2018   Procedure: Bladder BOTOX INJECTION;  Surgeon: [name], MD;  Location: [facility];  Service: Urology;  Laterality: N/A;   CARDIAC CATHETERIZATION     CARDIAC CATHETERIZATION N/A 01/22/2015   Procedure: Left Heart Cath;  Surgeon: Corey Skains, MD;  Location: [place] [facility];  Service: Cardiovascular;  Laterality: N/A;   CARDIAC CATHETERIZATION N/A 01/22/2015   Procedure: Coronary Stent Intervention;  Surgeon: Isaias Cowman, MD;  Location: [place] [facility];  Service: Cardiovascular;  Laterality: N/A;   CATARACT EXTRACTION, BILATERAL     COLONOSCOPY WITH PROPOFOL N/A 12/08/2015   Procedure: COLONOSCOPY WITH PROPOFOL;  Surgeon: Lollie Sails, MD;  Location: [facility] ENDOSCOPY;  Service: Endoscopy;  Laterality: N/A;   COLONOSCOPY  WITH PROPOFOL N/A 12/09/2015   Procedure: COLONOSCOPY WITH PROPOFOL;  Surgeon: Lollie Sails, MD;  Location: [facility] ENDOSCOPY;  Service: Endoscopy;  Laterality: N/A;   CORONARY ANGIOPLASTY     5 stents   CORONARY STENT PLACEMENT  2015   x5   CYSTOSCOPY N/A 09/11/2018   Procedure: CYSTOSCOPY;  Surgeon: [name], MD;  Location: [facility] ORS;  Service: Urology;  Laterality: N/A;   CYSTOSCOPY WITH BIOPSY N/A 12/21/2017   Procedure: CYSTOSCOPY WITH BIOPSY;  Surgeon: [name], MD;  Location: [facility] OR[facility];  Service: Urology;  Laterality: N/A;   ESOPHAGOGASTRODUODENOSCOPY (EGD) WITH PROPOFOL N/A 12/08/2015   Procedure: ESOPHAGOGASTRODUODENOSCOPY (EGD) WITH PROPOFOL;  Surgeon: Lollie Sails, MD;  Location: [facility] ENDOSCOPY;  Service:  Endoscopy;  Laterality: N/A;   ESOPHAGOGASTRODUODENOSCOPY (EGD) WITH PROPOFOL N/A 12/13/2017   Procedure: ESOPHAGOGASTRODUODENOSCOPY (EGD) WITH PROPOFOL;  Surgeon: Lollie Sails, MD;  Location: [facility] ENDOSCOPY;  Service: Endoscopy;  Laterality: N/A;   EYE SURGERY     HERNIA REPAIR     kidney tumor remove     TRANSURETHRAL RESECTION OF BLADDER TUMOR WITH GYRUS (TURBT-GYRUS)  86/7619   UMBILICAL HERNIA REPAIR     urethral meatotomy     Patient Active Problem List   Diagnosis Date Noted   Class 2 obesity due to excess calories with body mass index (BMI) of 36.0 to 36.9 in adult 04/11/2020   Swelling of limb 03/28/2020   Lymphedema 03/28/2020   Trigeminal neuralgia 12/25/2019   Lower limb ulcer, calf, left, limited to breakdown of skin ([place]) 12/25/2019   OSA (obstructive sleep apnea) 50/93/2671   Acute systolic CHF (congestive heart failure) ([place]) 12/12/2018   Bruising 07/10/2018   Diet-controlled type 2 diabetes mellitus ([place]) 03/24/2018   Microalbuminuria 03/24/2018   Dizziness 11/17/2017   Simple chronic bronchitis ([place]) 09/22/2017   SOBOE (shortness of breath on exertion) 02/18/2017   Chronic midline low back pain without sciatica 12/29/2016   Periodic limb movement disorder 12/29/2016   Benign essential tremor 06/22/2016   Pure hypercholesterolemia 06/20/2015   Erectile dysfunction due to arterial insufficiency 06/16/2015   Unstable angina ([place]) 01/22/2015   Abdominal aortic 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 rupture ([place]) 12/31/2014   Benign prostatic hypertrophy without urinary obstruction 07/31/2014   Enlarged prostate 07/31/2014   Benign essential HTN 06/11/2014   Carotid artery narrowing 02/08/2014   Carotid artery obstruction 02/08/2014   Bilateral carotid artery stenosis 02/08/2014   Chest pain 08/20/2013   Bilateral cataracts 05/16/2013   Cataract 05/16/2013   Clinical depression 12/12/2012   Diabetes mellitus, type 2 ([place]) 12/12/2012   Combined fat and carbohydrate  induced hyperlipemia 12/12/2012   Major depressive disorder, single episode, unspecified 12/12/2012   Acid reflux 11/09/2012   Presence of stent in coronary artery 11/09/2012   BP (high blood pressure) 11/09/2012   Healed myocardial infarct 11/09/2012   Gastro-esophageal reflux disease without esophagitis 11/09/2012   History of cardiovascular surgery 11/09/2012   Fothergill's neuralgia 08/07/2012   Swelling of testicle 04/06/2012   Disorder of male genital organ 04/06/2012   Swelling of the testicles 04/06/2012   Arteriosclerosis of coronary artery 11/16/2011   CAD in native artery 11/16/2011   Fatigue 06/04/2011   Avitaminosis D 11/30/2010    REFERRING DIAG: Repeated falls   THERAPY DIAG:   Difficulty in walking, not elsewhere classified  Muscle weakness (generalized)  Other lack of coordination  Abnormality of gait and mobility  Other abnormalities of gait and mobility  Unsteadiness on feet  Chronic bilateral low back pain  without sciatica  Rationale for Evaluation and Treatment Rehabilitation   Subjective Assessment - 09/22/21 0936     Subjective Patient reports feeling better overall- Had to get another antibiotic but seems to be helping the wound on his leg.    Patient is accompained by: Family member -Wife   Pertinent History Patient is a 76 year old male with referral for Physical Therapy with diagnosis of Parkinsons disease and history of falling. He reports he diagnosed last year with Parkinsons and has improved his tremors with use of medication but reports worsening shuffling and difficulty with mobility. Patient has past medical history signifiant for Parkinsons; Arthritis, bladder cancer, Trigemic Neuralgia. He lives with wife in 1 level home and current using cane with reported recent fall.    Limitations Walking;Lifting;Standing;House hold activities    How long can you sit comfortably? No limits    How long can you stand comfortably? 15 min- limited due to  back pain and foot numbess    How long can you walk comfortably? about 80 feet    Patient Stated Goals Improve my balance, be able to get up off the floor so I can maybe work in my garage again.    Currently in Pain? No/denies               PRECAUTIONS: Falls   PAIN:   No pain reported.     TODAY'S TREATMENT: 02/01/2022   There.ex:   NuStep INTERVAL Training L1-5 for 7 min for improved LE strength, coordination and endurance training. Total time = 7 min and 0.23 mi  Sit to stand x 10 with UE overhead raise- Independent today without multiple attempts.   NMR:   In // bars- step over 3 bands (theraband position laying flat on ground- for visual cue to increase step length) - then keep walking with 4 cones positioned narrow and patient amb tandem - back and forth x 6 times.   In // bars- side step over 3 bands then over 2 cones - down and back x 5.   Retro step over 3 therabands- down and back x 8 (VC to increase step length)   Step tap onto colored cones in // bars- PT would call out which leg and color of cone- Ex: right/black and patient focused on coordinating appropriate step tap.             PATIENT EDUCATION: Education details: form/technique with exercise Person educated: Patient Education method: Explanation, Demonstration, Tactile cues, and Verbal cues Education comprehension: verbalized understanding, returned demonstration, verbal cues required, tactile cues required, and needs further education   HOME EXERCISE PROGRAM: No updates today   PT Short Term Goals -       PT SHORT TERM GOAL #1   Title Pt will be independent with INITIAL  HEP in order to improve strength and balance in order to decrease fall risk and improve function at home and work.    Baseline 04/07/2021= No formal HEP in place. 05/11/2021= Patient reports doing pretty good- compliant with walking program and his stretching/strengthening.    Time 6    Period Weeks    Status Achieved     Target Date 05/19/21      PT SHORT TERM GOAL #2   Title Pt will decrease 5TSTS by at least 3 seconds in order to demonstrate clinically significant improvement in LE strength.    Baseline 04/07/2021= 25 sec; 05/11/2021= 18.65 sec    Time 6    Period Weeks  Status Achieved    Target Date 06/30/21              PT Long Term Goals -       PT LONG TERM GOAL #1   Title Pt will be independent with FINAL HEP in order to improve strength and balance in order to decrease fall risk and improve function at home and work.    Baseline 04/07/2021= No formal HEP in place. 07/01/2021=Patient reports performing walking and some standing LE strengthening exercises as instructed and states no questions at this time.  09/02/21: Pt reports he feels indep with exercises but is not performing HEP as much as he should; 11/17/2021= Patient reports compliant with current HEP- "I do them as best I can." 12/15/2021- Patient reports doing some exercises and walking but not feeling well in past week and denied performing HEP. Will keep goal active to ensure compliance and update HEP as appropriate.    Time 12    Period Weeks    Status Partially Met    Target Date 03/09/2022     PT LONG TERM GOAL #2   Title Pt will improve FOTO to target score of 45% to display perceived improvements in ability to complete ADL's.    Baseline 2/7/22023= 41%; FOTO=53%    Time 12    Period Weeks    Status Achieved    Target Date 06/30/21      PT LONG TERM GOAL #3   Title Pt will decrease 5TSTS by at least 5 seconds in order to demonstrate clinically significant improvement in LE strength.    Baseline 04/07/2021= 25 sec; 05/11/2021= 18.65 sec without UE support; 4/10= 13.76 sec without UE support    Time 12    Period Weeks    Status Achieved    Target Date 06/30/21      PT LONG TERM GOAL #4   Title Pt will decrease TUG to below 17 seconds/decrease in order to demonstrate decreased fall risk.    Baseline 04/07/2021= 21 sec with SPC;  05/11/2021= 18.85 sec with use of cane; 07/21/2021=18.88 sec  avg with use of cane; 7/5: 22.85 sec with QC; 09/29/2021= 21.45 sec using upright walker; 11/17/2021= 20. 46 sec using upright walker; 10/17= 24 sec with upright walker   Time 12    Period Weeks    Status Partially Met    Target Date 03/09/2022     PT LONG TERM GOAL #5   Title Pt will increase 10MWT by at least 0.15 m/s in order to demonstrate clinically significant improvement in community ambulation.    Baseline 04/07/2021= 0.56 m/s using SPC; 05/11/2021= 0.54 m/s using SPC; 07/01/2021= 0.6 m/s/ 7/5: 0.48 m/s without AD but with CGA; 09/29/2021= 0.64 m/s; 11/17/2021= 0.61 m/s using upright 4WW; 10/17= 0.59 m/s; 12/29/2021= 0.64 m/s   Time 12    Period Weeks    Status On-going    Target Date 03/09/2022     PT LONG TERM GOAL #6   Title Pt will increase 6MWT by at least 86m(1677f in order to demonstrate clinically significant improvement in cardiopulmonary endurance and community ambulation    Baseline 06/08/2021= 600 ft. in 5 min- Stopped due to fatigue using 4WW; 07/01/2021= 665 in 5 min 45 sec using 4WW; 7/5: 367 ft with QC; 09/29/2021= 740 feet using upright 4WW; 11/17/2021= 705 feet using upright 4WW; 12/15/2021= 680 feet with upright 4WW   Time 12   Period Weeks    Status On-going    Target  Date 03/09/2022              Plan - 08/20/21 0939     Clinical Impression Statement Patient presented today with good motivation for all tasks. He presented with good abilty to take appropriate step with visual cues including colored theraband and/or cones on floor today. He continues to require VC for all retro steps yet no LOB today. Patient will continue to benefit from skilled therapy to address remaining deficits in order to improve overall QoL and return to PLOF.        Personal Factors and Comorbidities Comorbidity 3+    Comorbidities arthritis, bladder cancer, Trigeminal Neuralgia    Examination-Activity Limitations Caring for  Others;Carry;Continence;Lift;Squat;Stairs;Stand    Examination-Participation Restrictions Community Activity;Yard Work    [employer] Evolving/Moderate complexity    Rehab Potential Good    PT Frequency 2x / week    PT Duration 12 weeks    PT Treatment/Interventions ADLs/Self Care Home Management;Cryotherapy;Canalith Repostioning;Electrical Stimulation;Moist Heat;DME Instruction;Gait training;Stair training;Functional mobility training;Therapeutic activities;Therapeutic exercise;Balance training;Neuromuscular re-education;Patient/family education;Manual techniques;Passive range of motion;Dry needling;Vestibular    PT Next Visit Plan LE strength, improve step length with ambulation, muscle tissue lengthening    PT Home Exercise Plan Access Code: [place], PT Physical Therapist- [facility]  02/02/22, 11:28 AM

## 2022-02-02 ENCOUNTER — Ambulatory Visit: Payer: Medicare HMO | Attending: Neurology

## 2022-02-02 DIAGNOSIS — R278 Other lack of coordination: Secondary | ICD-10-CM | POA: Insufficient documentation

## 2022-02-02 DIAGNOSIS — R2681 Unsteadiness on feet: Secondary | ICD-10-CM | POA: Insufficient documentation

## 2022-02-02 DIAGNOSIS — R262 Difficulty in walking, not elsewhere classified: Secondary | ICD-10-CM | POA: Insufficient documentation

## 2022-02-02 DIAGNOSIS — G8929 Other chronic pain: Secondary | ICD-10-CM | POA: Insufficient documentation

## 2022-02-02 DIAGNOSIS — R2689 Other abnormalities of gait and mobility: Secondary | ICD-10-CM | POA: Diagnosis present

## 2022-02-02 DIAGNOSIS — M545 Low back pain, unspecified: Secondary | ICD-10-CM | POA: Diagnosis present

## 2022-02-02 DIAGNOSIS — R269 Unspecified abnormalities of gait and mobility: Secondary | ICD-10-CM | POA: Diagnosis present

## 2022-02-02 DIAGNOSIS — M6281 Muscle weakness (generalized): Secondary | ICD-10-CM | POA: Diagnosis present

## 2022-02-04 ENCOUNTER — Other Ambulatory Visit: Payer: Self-pay | Admitting: Urology

## 2022-02-04 ENCOUNTER — Ambulatory Visit: Payer: Medicare HMO

## 2022-02-05 ENCOUNTER — Other Ambulatory Visit: Payer: Self-pay | Admitting: Urology

## 2022-02-09 ENCOUNTER — Ambulatory Visit: Payer: Medicare HMO

## 2022-02-09 DIAGNOSIS — R262 Difficulty in walking, not elsewhere classified: Secondary | ICD-10-CM

## 2022-02-09 DIAGNOSIS — M545 Low back pain, unspecified: Secondary | ICD-10-CM

## 2022-02-09 DIAGNOSIS — R278 Other lack of coordination: Secondary | ICD-10-CM

## 2022-02-09 DIAGNOSIS — R2681 Unsteadiness on feet: Secondary | ICD-10-CM

## 2022-02-09 DIAGNOSIS — M6281 Muscle weakness (generalized): Secondary | ICD-10-CM

## 2022-02-09 DIAGNOSIS — R269 Unspecified abnormalities of gait and mobility: Secondary | ICD-10-CM

## 2022-02-09 DIAGNOSIS — R2689 Other abnormalities of gait and mobility: Secondary | ICD-10-CM

## 2022-02-09 NOTE — Therapy (Signed)
OUTPATIENT PHYSICAL THERAPY TREATMENT NOTE      Patient Name: [name] MRN: [id number] DOB:09-04-1945, 76 y.o., male Today's Date: 02/09/2022  PCP: [name]. [name], FNP REFERRING PROVIDER: Dr. Gurney Maxin   PT End of Session - 02/09/22 0937     Visit Number 84    Number of Visits 90    Date for PT Re-Evaluation 03/09/22    Authorization Type [employer]    Authorization Time Period 04/05/34-6/44/03; recert 47/42/5956- 05/07/7562    Progress Note Due on Visit 80    PT Start Time 0932    PT Stop Time 1013    PT Time Calculation (min) 41 min    Equipment Utilized During Treatment Gait belt    Activity Tolerance Patient tolerated treatment well;No increased pain    Behavior During Therapy [facility] for tasks assessed/performed                Past Medical History:  Diagnosis Date   Anxiety    Arteriosclerosis of coronary artery 11/16/2011   Overview:  Stent 10/2011 stent rca 2015 with collaterals to lad which is chronically occluded    Benign enlargement of prostate    Benign essential HTN 06/11/2014   Benign prostatic hypertrophy without urinary obstruction 07/31/2014   Bilateral cataracts 05/16/2013   Overview:  Dr. [facility]     Bone spur of foot    Left   BP (high blood pressure) 11/09/2012   Cancer ([place])    skin (forehead) and bladder   Carotid artery narrowing 02/08/2014   Depression    Detrusor hypertrophy    Diabetes ([place])    Diabetes mellitus, type 2 ([place]) 12/12/2012   Diverticulosis    Dyspnea    Esophageal reflux    Esophageal reflux    Fothergill's neuralgia 08/07/2012   Overview:  [facility]    Gastritis    GERD (gastroesophageal reflux disease)    Headache    cluster headaches   Healed myocardial infarct 11/09/2012   Hearing loss in left ear    Heart disease    Hematuria    Hemorrhoids    History of hiatal hernia 12/14/2017   small    Hypercholesteremia    Lesion of bladder    Myocardial infarct (HCC)    Presence of stent in  coronary artery 11/09/2012   Rectal bleeding    Trigeminal neuralgia    Trigeminal neuralgia    Valvular heart disease    Vitamin D deficiency    Past Surgical History:  Procedure Laterality Date   APPENDECTOMY     BOTOX INJECTION N/A 12/21/2017   Procedure: Bladder BOTOX INJECTION;  Surgeon: [name], MD;  Location: [facility] ORS;  Service: Urology;  Laterality: N/A;   BOTOX INJECTION N/A 09/11/2018   Procedure: Bladder BOTOX INJECTION;  Surgeon: [name], MD;  Location: [facility] ORS;  Service: Urology;  Laterality: N/A;   CARDIAC CATHETERIZATION     CARDIAC CATHETERIZATION N/A 01/22/2015   Procedure: Left Heart Cath;  Surgeon: Corey Skains, MD;  Location: [place];  Service: Cardiovascular;  Laterality: N/A;   CARDIAC CATHETERIZATION N/A 01/22/2015   Procedure: Coronary Stent Intervention;  Surgeon: Isaias Cowman, MD;  Location: [place] [facility];  Service: Cardiovascular;  Laterality: N/A;   CATARACT EXTRACTION, BILATERAL     COLONOSCOPY WITH PROPOFOL N/A 12/08/2015   Procedure: COLONOSCOPY WITH PROPOFOL;  Surgeon: Lollie Sails, MD;  Location: [facility] ENDOSCOPY;  Service: Endoscopy;  Laterality: N/A;   COLONOSCOPY  WITH PROPOFOL N/A 12/09/2015   Procedure: COLONOSCOPY WITH PROPOFOL;  Surgeon: Lollie Sails, MD;  Location: [facility] ENDOSCOPY;  Service: Endoscopy;  Laterality: N/A;   CORONARY ANGIOPLASTY     5 stents   CORONARY STENT PLACEMENT  2015   x5   CYSTOSCOPY N/A 09/11/2018   Procedure: CYSTOSCOPY;  Surgeon: [name], MD;  Location: [facility] ORS;  Service: Urology;  Laterality: N/A;   CYSTOSCOPY WITH BIOPSY N/A 12/21/2017   Procedure: CYSTOSCOPY WITH BIOPSY;  Surgeon: [name], MD;  Location: [facility] ORS;  Service: Urology;  Laterality: N/A;   ESOPHAGOGASTRODUODENOSCOPY (EGD) WITH PROPOFOL N/A 12/08/2015   Procedure: ESOPHAGOGASTRODUODENOSCOPY (EGD) WITH PROPOFOL;  Surgeon: Lollie Sails, MD;  Location: [facility] ENDOSCOPY;  Service:  Endoscopy;  Laterality: N/A;   ESOPHAGOGASTRODUODENOSCOPY (EGD) WITH PROPOFOL N/A 12/13/2017   Procedure: ESOPHAGOGASTRODUODENOSCOPY (EGD) WITH PROPOFOL;  Surgeon: Lollie Sails, MD;  Location: [facility] ENDOSCOPY;  Service: Endoscopy;  Laterality: N/A;   EYE SURGERY     HERNIA REPAIR     kidney tumor remove     TRANSURETHRAL RESECTION OF BLADDER TUMOR WITH GYRUS (TURBT-GYRUS)  80/9983   UMBILICAL HERNIA REPAIR     urethral meatotomy     Patient Active Problem List   Diagnosis Date Noted   Class 2 obesity due to excess calories with body mass index (BMI) of 36.0 to 36.9 in adult 04/11/2020   Swelling of limb 03/28/2020   Lymphedema 03/28/2020   Trigeminal neuralgia 12/25/2019   Lower limb ulcer, calf, left, limited to breakdown of skin ([place]) 12/25/2019   OSA (obstructive sleep apnea) 38/25/0539   Acute systolic CHF (congestive heart failure) ([place]) 12/12/2018   Bruising 07/10/2018   Diet-controlled type 2 diabetes mellitus ([place]) 03/24/2018   Microalbuminuria 03/24/2018   Dizziness 11/17/2017   Simple chronic bronchitis ([place]) 09/22/2017   SOBOE (shortness of breath on exertion) 02/18/2017   Chronic midline low back pain without sciatica 12/29/2016   Periodic limb movement disorder 12/29/2016   Benign essential tremor 06/22/2016   Pure hypercholesterolemia 06/20/2015   Erectile dysfunction due to arterial insufficiency 06/16/2015   Unstable angina ([place]) 01/22/2015   Abdominal aortic 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 rupture ([place]) 12/31/2014   Benign prostatic hypertrophy without urinary obstruction 07/31/2014   Enlarged prostate 07/31/2014   Benign essential HTN 06/11/2014   Carotid artery narrowing 02/08/2014   Carotid artery obstruction 02/08/2014   Bilateral carotid artery stenosis 02/08/2014   Chest pain 08/20/2013   Bilateral cataracts 05/16/2013   Cataract 05/16/2013   Clinical depression 12/12/2012   Diabetes mellitus, type 2 ([place]) 12/12/2012   Combined fat and carbohydrate  induced hyperlipemia 12/12/2012   Major depressive disorder, single episode, unspecified 12/12/2012   Acid reflux 11/09/2012   Presence of stent in coronary artery 11/09/2012   BP (high blood pressure) 11/09/2012   Healed myocardial infarct 11/09/2012   Gastro-esophageal reflux disease without esophagitis 11/09/2012   History of cardiovascular surgery 11/09/2012   Fothergill's neuralgia 08/07/2012   Swelling of testicle 04/06/2012   Disorder of male genital organ 04/06/2012   Swelling of the testicles 04/06/2012   Arteriosclerosis of coronary artery 11/16/2011   CAD in native artery 11/16/2011   Fatigue 06/04/2011   Avitaminosis D 11/30/2010    REFERRING DIAG: Repeated falls   THERAPY DIAG:   Difficulty in walking, not elsewhere classified  Muscle weakness (generalized)  Other lack of coordination  Abnormality of gait and mobility  Other abnormalities of gait and mobility  Unsteadiness on feet  Chronic bilateral low back pain  without sciatica  Rationale for Evaluation and Treatment Rehabilitation   Subjective Assessment - 09/22/21 0936     Subjective Patient reports getting around pretty good at home using his walker.    Patient is accompained by: Family member -Wife   Pertinent History Patient is a 76 year old male with referral for Physical Therapy with diagnosis of Parkinsons disease and history of falling. He reports he diagnosed last year with Parkinsons and has improved his tremors with use of medication but reports worsening shuffling and difficulty with mobility. Patient has past medical history signifiant for Parkinsons; Arthritis, bladder cancer, Trigemic Neuralgia. He lives with wife in 1 level home and current using cane with reported recent fall.    Limitations Walking;Lifting;Standing;House hold activities    How long can you sit comfortably? No limits    How long can you stand comfortably? 15 min- limited due to back pain and foot numbess    How long can  you walk comfortably? about 80 feet    Patient Stated Goals Improve my balance, be able to get up off the floor so I can maybe work in my garage again.    Currently in Pain? No/denies               PRECAUTIONS: Falls   PAIN:   No pain reported.     TODAY'S TREATMENT: 02/09/2022   There.ex:   NuStep INTERVAL Training L1-5 for 6 min for improved LE strength, coordination and endurance training. Total time = 6 min and 0.19 mi  Sit to stand x 10 with UE overhead raise holding onto 2 kg ball  Donkey kick x 12 reps BLE (Observed increased hip flex tightness) so transitioned to hip flex stretch in standing  Hip flex stretch - hold 20 sec x 3 each LE (foot positioned on stool)   NMR:   In // bars-    In large staggered stance position- Patient performed trunk twist to left x 10 then to the right x 10x  In // bars-  Retro gait in // bars focusing on posterior LE strength (VC to increase step length) 16 initial - down to 10 reps with practice .  Forward gait in // bars focusing on increasing step length- avg between 7-9 steps x 5 down and back  Forward lunge squat in // bars with minimal to no UE support  down and back x 3 focusing on performing increased step (4 squats down to one end then 4 back) x 5 down and back.               PATIENT EDUCATION: Education details: form/technique with exercise Person educated: Patient Education method: Explanation, Demonstration, Tactile cues, and Verbal cues Education comprehension: verbalized understanding, returned demonstration, verbal cues required, tactile cues required, and needs further education   HOME EXERCISE PROGRAM: No updates today   PT Short Term Goals -       PT SHORT TERM GOAL #1   Title Pt will be independent with INITIAL  HEP in order to improve strength and balance in order to decrease fall risk and improve function at home and work.    Baseline 04/07/2021= No formal HEP in place. 05/11/2021= Patient  reports doing pretty good- compliant with walking program and his stretching/strengthening.    Time 6    Period Weeks    Status Achieved    Target Date 05/19/21      PT SHORT TERM GOAL #2   Title Pt will decrease 5TSTS  by at least 3 seconds in order to demonstrate clinically significant improvement in LE strength.    Baseline 04/07/2021= 25 sec; 05/11/2021= 18.65 sec    Time 6    Period Weeks    Status Achieved    Target Date 06/30/21              PT Long Term Goals -       PT LONG TERM GOAL #1   Title Pt will be independent with FINAL HEP in order to improve strength and balance in order to decrease fall risk and improve function at home and work.    Baseline 04/07/2021= No formal HEP in place. 07/01/2021=Patient reports performing walking and some standing LE strengthening exercises as instructed and states no questions at this time.  09/02/21: Pt reports he feels indep with exercises but is not performing HEP as much as he should; 11/17/2021= Patient reports compliant with current HEP- "I do them as best I can." 12/15/2021- Patient reports doing some exercises and walking but not feeling well in past week and denied performing HEP. Will keep goal active to ensure compliance and update HEP as appropriate.    Time 12    Period Weeks    Status Partially Met    Target Date 03/09/2022     PT LONG TERM GOAL #2   Title Pt will improve FOTO to target score of 45% to display perceived improvements in ability to complete ADL's.    Baseline 2/7/22023= 41%; FOTO=53%    Time 12    Period Weeks    Status Achieved    Target Date 06/30/21      PT LONG TERM GOAL #3   Title Pt will decrease 5TSTS by at least 5 seconds in order to demonstrate clinically significant improvement in LE strength.    Baseline 04/07/2021= 25 sec; 05/11/2021= 18.65 sec without UE support; 4/10= 13.76 sec without UE support    Time 12    Period Weeks    Status Achieved    Target Date 06/30/21      PT LONG TERM GOAL #4    Title Pt will decrease TUG to below 17 seconds/decrease in order to demonstrate decreased fall risk.    Baseline 04/07/2021= 21 sec with SPC; 05/11/2021= 18.85 sec with use of cane; 07/21/2021=18.88 sec  avg with use of cane; 7/5: 22.85 sec with QC; 09/29/2021= 21.45 sec using upright walker; 11/17/2021= 20. 46 sec using upright walker; 10/17= 24 sec with upright walker   Time 12    Period Weeks    Status Partially Met    Target Date 03/09/2022     PT LONG TERM GOAL #5   Title Pt will increase 10MWT by at least 0.15 m/s in order to demonstrate clinically significant improvement in community ambulation.    Baseline 04/07/2021= 0.56 m/s using SPC; 05/11/2021= 0.54 m/s using SPC; 07/01/2021= 0.6 m/s/ 7/5: 0.48 m/s without AD but with CGA; 09/29/2021= 0.64 m/s; 11/17/2021= 0.61 m/s using upright 4WW; 10/17= 0.59 m/s; 12/29/2021= 0.64 m/s   Time 12    Period Weeks    Status On-going    Target Date 03/09/2022     PT LONG TERM GOAL #6   Title Pt will increase 6MWT by at least 94m(1642f in order to demonstrate clinically significant improvement in cardiopulmonary endurance and community ambulation    Baseline 06/08/2021= 600 ft. in 5 min- Stopped due to fatigue using 4WW; 07/01/2021= 665 in 5 min 45 sec using 4WW; 7/5: 367  ft with QC; 09/29/2021= 740 feet using upright 4WW; 11/17/2021= 705 feet using upright 4WW; 12/15/2021= 680 feet with upright 4WW   Time 12   Period Weeks    Status On-going    Target Date 03/09/2022              Plan - 08/20/21 0939     Clinical Impression Statement Patient presents with good motivation for today's session. He was responsive to strengthening and able to demo good ability to sit to stand and take larger steps upon command today. Patient will continue to benefit from skilled therapy to address remaining deficits in order to improve overall QoL and return to PLOF.        Personal Factors and Comorbidities Comorbidity 3+    Comorbidities arthritis, bladder cancer, Trigeminal  Neuralgia    Examination-Activity Limitations Caring for Others;Carry;Continence;Lift;Squat;Stairs;Stand    Examination-Participation Restrictions Community Activity;Yard Work    [employer] Evolving/Moderate complexity    Rehab Potential Good    PT Frequency 2x / week    PT Duration 12 weeks    PT Treatment/Interventions ADLs/Self Care Home Management;Cryotherapy;Canalith Repostioning;Electrical Stimulation;Moist Heat;DME Instruction;Gait training;Stair training;Functional mobility training;Therapeutic activities;Therapeutic exercise;Balance training;Neuromuscular re-education;Patient/family education;Manual techniques;Passive range of motion;Dry needling;Vestibular    PT Next Visit Plan LE strength, improve step length with ambulation, muscle tissue lengthening    PT Home Exercise Plan Access Code: Kemp, PT Physical Therapist- [facility]  02/09/22, 4:55 PM

## 2022-02-11 ENCOUNTER — Ambulatory Visit: Payer: Medicare HMO

## 2022-02-11 DIAGNOSIS — R269 Unspecified abnormalities of gait and mobility: Secondary | ICD-10-CM

## 2022-02-11 DIAGNOSIS — M545 Low back pain, unspecified: Secondary | ICD-10-CM

## 2022-02-11 DIAGNOSIS — R262 Difficulty in walking, not elsewhere classified: Secondary | ICD-10-CM | POA: Diagnosis not present

## 2022-02-11 DIAGNOSIS — R278 Other lack of coordination: Secondary | ICD-10-CM

## 2022-02-11 DIAGNOSIS — R2689 Other abnormalities of gait and mobility: Secondary | ICD-10-CM

## 2022-02-11 DIAGNOSIS — M6281 Muscle weakness (generalized): Secondary | ICD-10-CM

## 2022-02-11 DIAGNOSIS — R2681 Unsteadiness on feet: Secondary | ICD-10-CM

## 2022-02-11 NOTE — Therapy (Signed)
OUTPATIENT PHYSICAL THERAPY TREATMENT NOTE      Patient Name: [name] MRN: [id number] DOB:09-25-1945, 76 y.o., male Today's Date: 02/11/2022  PCP: [name]. [name], FNP REFERRING PROVIDER: Dr. Gurney Maxin   PT End of Session - 02/11/22 0946     Visit Number 1    Number of Visits 90    Date for PT Re-Evaluation 03/09/22    Authorization Type [employer]    Authorization Time Period 07/04/36-9/37/34; recert 28/76/8115- 08/30/6201    Progress Note Due on Visit 80    PT Start Time 0932    PT Stop Time 1014    PT Time Calculation (min) 42 min    Equipment Utilized During Treatment Gait belt    Activity Tolerance Patient tolerated treatment well;No increased pain    Behavior During Therapy [facility] for tasks assessed/performed                Past Medical History:  Diagnosis Date   Anxiety    Arteriosclerosis of coronary artery 11/16/2011   Overview:  Stent 10/2011 stent rca 2015 with collaterals to lad which is chronically occluded    Benign enlargement of prostate    Benign essential HTN 06/11/2014   Benign prostatic hypertrophy without urinary obstruction 07/31/2014   Bilateral cataracts 05/16/2013   Overview:  Dr. [facility]     Bone spur of foot    Left   BP (high blood pressure) 11/09/2012   Cancer ([place])    skin (forehead) and bladder   Carotid artery narrowing 02/08/2014   Depression    Detrusor hypertrophy    Diabetes ([place])    Diabetes mellitus, type 2 ([place]) 12/12/2012   Diverticulosis    Dyspnea    Esophageal reflux    Esophageal reflux    Fothergill's neuralgia 08/07/2012   Overview:  [facility]    Gastritis    GERD (gastroesophageal reflux disease)    Headache    cluster headaches   Healed myocardial infarct 11/09/2012   Hearing loss in left ear    Heart disease    Hematuria    Hemorrhoids    History of hiatal hernia 12/14/2017   small    Hypercholesteremia    Lesion of bladder    Myocardial infarct (HCC)    Presence of stent in  coronary artery 11/09/2012   Rectal bleeding    Trigeminal neuralgia    Trigeminal neuralgia    Valvular heart disease    Vitamin D deficiency    Past Surgical History:  Procedure Laterality Date   APPENDECTOMY     BOTOX INJECTION N/A 12/21/2017   Procedure: Bladder BOTOX INJECTION;  Surgeon: [name], MD;  Location: [facility] ORS;  Service: Urology;  Laterality: N/A;   BOTOX INJECTION N/A 09/11/2018   Procedure: Bladder BOTOX INJECTION;  Surgeon: [name], MD;  Location: [facility] ORS;  Service: Urology;  Laterality: N/A;   CARDIAC CATHETERIZATION     CARDIAC CATHETERIZATION N/A 01/22/2015   Procedure: Left Heart Cath;  Surgeon: Corey Skains, MD;  Location: [place] [facility];  Service: Cardiovascular;  Laterality: N/A;   CARDIAC CATHETERIZATION N/A 01/22/2015   Procedure: Coronary Stent Intervention;  Surgeon: Isaias Cowman, MD;  Location: [place] [facility];  Service: Cardiovascular;  Laterality: N/A;   CATARACT EXTRACTION, BILATERAL     COLONOSCOPY WITH PROPOFOL N/A 12/08/2015   Procedure: COLONOSCOPY WITH PROPOFOL;  Surgeon: Lollie Sails, MD;  Location: [facility] ENDOSCOPY;  Service: Endoscopy;  Laterality: N/A;   COLONOSCOPY  WITH PROPOFOL N/A 12/09/2015   Procedure: COLONOSCOPY WITH PROPOFOL;  Surgeon: Lollie Sails, MD;  Location: [facility] ENDOSCOPY;  Service: Endoscopy;  Laterality: N/A;   CORONARY ANGIOPLASTY     5 stents   CORONARY STENT PLACEMENT  2015   x5   CYSTOSCOPY N/A 09/11/2018   Procedure: CYSTOSCOPY;  Surgeon: [name], MD;  Location: [facility] ORS;  Service: Urology;  Laterality: N/A;   CYSTOSCOPY WITH BIOPSY N/A 12/21/2017   Procedure: CYSTOSCOPY WITH BIOPSY;  Surgeon: [name], MD;  Location: [facility] OR[facility];  Service: Urology;  Laterality: N/A;   ESOPHAGOGASTRODUODENOSCOPY (EGD) WITH PROPOFOL N/A 12/08/2015   Procedure: ESOPHAGOGASTRODUODENOSCOPY (EGD) WITH PROPOFOL;  Surgeon: Lollie Sails, MD;  Location: [facility] ENDOSCOPY;  Service:  Endoscopy;  Laterality: N/A;   ESOPHAGOGASTRODUODENOSCOPY (EGD) WITH PROPOFOL N/A 12/13/2017   Procedure: ESOPHAGOGASTRODUODENOSCOPY (EGD) WITH PROPOFOL;  Surgeon: Lollie Sails, MD;  Location: [facility] ENDOSCOPY;  Service: Endoscopy;  Laterality: N/A;   EYE SURGERY     HERNIA REPAIR     kidney tumor remove     TRANSURETHRAL RESECTION OF BLADDER TUMOR WITH GYRUS (TURBT-GYRUS)  10/2328   UMBILICAL HERNIA REPAIR     urethral meatotomy     Patient Active Problem List   Diagnosis Date Noted   Class 2 obesity due to excess calories with body mass index (BMI) of 36.0 to 36.9 in adult 04/11/2020   Swelling of limb 03/28/2020   Lymphedema 03/28/2020   Trigeminal neuralgia 12/25/2019   Lower limb ulcer, calf, left, limited to breakdown of skin ([place]) 12/25/2019   OSA (obstructive sleep apnea) 07/62/2633   Acute systolic CHF (congestive heart failure) ([place]) 12/12/2018   Bruising 07/10/2018   Diet-controlled type 2 diabetes mellitus ([place]) 03/24/2018   Microalbuminuria 03/24/2018   Dizziness 11/17/2017   Simple chronic bronchitis ([place]) 09/22/2017   SOBOE (shortness of breath on exertion) 02/18/2017   Chronic midline low back pain without sciatica 12/29/2016   Periodic limb movement disorder 12/29/2016   Benign essential tremor 06/22/2016   Pure hypercholesterolemia 06/20/2015   Erectile dysfunction due to arterial insufficiency 06/16/2015   Unstable angina ([place]) 01/22/2015   Abdominal aortic 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 rupture ([place]) 12/31/2014   Benign prostatic hypertrophy without urinary obstruction 07/31/2014   Enlarged prostate 07/31/2014   Benign essential HTN 06/11/2014   Carotid artery narrowing 02/08/2014   Carotid artery obstruction 02/08/2014   Bilateral carotid artery stenosis 02/08/2014   Chest pain 08/20/2013   Bilateral cataracts 05/16/2013   Cataract 05/16/2013   Clinical depression 12/12/2012   Diabetes mellitus, type 2 ([place]) 12/12/2012   Combined fat and carbohydrate  induced hyperlipemia 12/12/2012   Major depressive disorder, single episode, unspecified 12/12/2012   Acid reflux 11/09/2012   Presence of stent in coronary artery 11/09/2012   BP (high blood pressure) 11/09/2012   Healed myocardial infarct 11/09/2012   Gastro-esophageal reflux disease without esophagitis 11/09/2012   History of cardiovascular surgery 11/09/2012   Fothergill's neuralgia 08/07/2012   Swelling of testicle 04/06/2012   Disorder of male genital organ 04/06/2012   Swelling of the testicles 04/06/2012   Arteriosclerosis of coronary artery 11/16/2011   CAD in native artery 11/16/2011   Fatigue 06/04/2011   Avitaminosis D 11/30/2010    REFERRING DIAG: Repeated falls   THERAPY DIAG:   Difficulty in walking, not elsewhere classified  Muscle weakness (generalized)  Other lack of coordination  Abnormality of gait and mobility  Other abnormalities of gait and mobility  Unsteadiness on feet  Chronic bilateral low back pain  without sciatica  Rationale for Evaluation and Treatment Rehabilitation   Subjective Assessment - 09/22/21 0936     Subjective Patient reports getting around pretty good at home using his walker.    Patient is accompained by: Family member -Wife   Pertinent History Patient is a 76 year old male with referral for Physical Therapy with diagnosis of Parkinsons disease and history of falling. He reports he diagnosed last year with Parkinsons and has improved his tremors with use of medication but reports worsening shuffling and difficulty with mobility. Patient has past medical history signifiant for Parkinsons; Arthritis, bladder cancer, Trigemic Neuralgia. He lives with wife in 1 level home and current using cane with reported recent fall.    Limitations Walking;Lifting;Standing;House hold activities    How long can you sit comfortably? No limits    How long can you stand comfortably? 15 min- limited due to back pain and foot numbess    How long can  you walk comfortably? about 80 feet    Patient Stated Goals Improve my balance, be able to get up off the floor so I can maybe work in my garage again.    Currently in Pain? No/denies               PRECAUTIONS: Falls   PAIN:   No pain reported.     TODAY'S TREATMENT: 02/09/2022   There.ex:   NuStep INTERVAL Training L1-5 for 8 min for improved LE strength, coordination and endurance training. Total time = 8 min and 0.27 mi  Sit to stand x 15 with UE overhead raise holding onto PVC Pipe   Lumbar flex (forward/diagonal- left, center, Forward/diagonal - right) x 20 reps each direction    Donkey kick x 12 reps BLE (Observed increased hip flex tightness) so transitioned to hip flex stretch in standing  Hip flex stretch - hold 20 sec x 3 each LE (foot positioned on 2nd step)   Step up onto 2nd step with 1 UE support x 15 reps each LE  Calf stretch BLE on step -hold 20 sec x 3.     NMR:    Step tap onto 1st step without UE support x 20 reps ( mild unsteadiness)               PATIENT EDUCATION: Education details: form/technique with exercise Person educated: Patient Education method: Explanation, Demonstration, Tactile cues, and Verbal cues Education comprehension: verbalized understanding, returned demonstration, verbal cues required, tactile cues required, and needs further education   HOME EXERCISE PROGRAM: No updates today   PT Short Term Goals -       PT SHORT TERM GOAL #1   Title Pt will be independent with INITIAL  HEP in order to improve strength and balance in order to decrease fall risk and improve function at home and work.    Baseline 04/07/2021= No formal HEP in place. 05/11/2021= Patient reports doing pretty good- compliant with walking program and his stretching/strengthening.    Time 6    Period Weeks    Status Achieved    Target Date 05/19/21      PT SHORT TERM GOAL #2   Title Pt will decrease 5TSTS by at least 3 seconds in order  to demonstrate clinically significant improvement in LE strength.    Baseline 04/07/2021= 25 sec; 05/11/2021= 18.65 sec    Time 6    Period Weeks    Status Achieved    Target Date 06/30/21  PT Long Term Goals -       PT LONG TERM GOAL #1   Title Pt will be independent with FINAL HEP in order to improve strength and balance in order to decrease fall risk and improve function at home and work.    Baseline 04/07/2021= No formal HEP in place. 07/01/2021=Patient reports performing walking and some standing LE strengthening exercises as instructed and states no questions at this time.  09/02/21: Pt reports he feels indep with exercises but is not performing HEP as much as he should; 11/17/2021= Patient reports compliant with current HEP- "I do them as best I can." 12/15/2021- Patient reports doing some exercises and walking but not feeling well in past week and denied performing HEP. Will keep goal active to ensure compliance and update HEP as appropriate.    Time 12    Period Weeks    Status Partially Met    Target Date 03/09/2022     PT LONG TERM GOAL #2   Title Pt will improve FOTO to target score of 45% to display perceived improvements in ability to complete ADL's.    Baseline 2/7/22023= 41%; FOTO=53%    Time 12    Period Weeks    Status Achieved    Target Date 06/30/21      PT LONG TERM GOAL #3   Title Pt will decrease 5TSTS by at least 5 seconds in order to demonstrate clinically significant improvement in LE strength.    Baseline 04/07/2021= 25 sec; 05/11/2021= 18.65 sec without UE support; 4/10= 13.76 sec without UE support    Time 12    Period Weeks    Status Achieved    Target Date 06/30/21      PT LONG TERM GOAL #4   Title Pt will decrease TUG to below 17 seconds/decrease in order to demonstrate decreased fall risk.    Baseline 04/07/2021= 21 sec with SPC; 05/11/2021= 18.85 sec with use of cane; 07/21/2021=18.88 sec  avg with use of cane; 7/5: 22.85 sec with QC; 09/29/2021=  21.45 sec using upright walker; 11/17/2021= 20. 46 sec using upright walker; 10/17= 24 sec with upright walker   Time 12    Period Weeks    Status Partially Met    Target Date 03/09/2022     PT LONG TERM GOAL #5   Title Pt will increase 10MWT by at least 0.15 m/s in order to demonstrate clinically significant improvement in community ambulation.    Baseline 04/07/2021= 0.56 m/s using SPC; 05/11/2021= 0.54 m/s using SPC; 07/01/2021= 0.6 m/s/ 7/5: 0.48 m/s without AD but with CGA; 09/29/2021= 0.64 m/s; 11/17/2021= 0.61 m/s using upright 4WW; 10/17= 0.59 m/s; 12/29/2021= 0.64 m/s   Time 12    Period Weeks    Status On-going    Target Date 03/09/2022     PT LONG TERM GOAL #6   Title Pt will increase 6MWT by at least 42m(1659f in order to demonstrate clinically significant improvement in cardiopulmonary endurance and community ambulation    Baseline 06/08/2021= 600 ft. in 5 min- Stopped due to fatigue using 4WW; 07/01/2021= 665 in 5 min 45 sec using 4WW; 7/5: 367 ft with QC; 09/29/2021= 740 feet using upright 4WW; 11/17/2021= 705 feet using upright 4WW; 12/15/2021= 680 feet with upright 4WW   Time 12   Period Weeks    Status On-going    Target Date 03/09/2022              Plan - 08/20/21 [id number]  Clinical Impression Statement Treatment concentrated on flexibility and LE strengthening today. Patient responded well with no LOB and able to take large steps up to 2nd step. Patient fatigued upon completion- no report of pain only fatigue. Patient will continue to benefit from skilled therapy to address remaining deficits in order to improve overall QoL and return to PLOF.        Personal Factors and Comorbidities Comorbidity 3+    Comorbidities arthritis, bladder cancer, Trigeminal Neuralgia    Examination-Activity Limitations Caring for Others;Carry;Continence;Lift;Squat;Stairs;Stand    Examination-Participation Restrictions Community Activity;Yard Work    [employer]  Evolving/Moderate complexity    Rehab Potential Good    PT Frequency 2x / week    PT Duration 12 weeks    PT Treatment/Interventions ADLs/Self Care Home Management;Cryotherapy;Canalith Repostioning;Electrical Stimulation;Moist Heat;DME Instruction;Gait training;Stair training;Functional mobility training;Therapeutic activities;Therapeutic exercise;Balance training;Neuromuscular re-education;Patient/family education;Manual techniques;Passive range of motion;Dry needling;Vestibular    PT Next Visit Plan LE strength, improve step length with ambulation, muscle tissue lengthening    PT Home Exercise Plan Access Code: [place], PT Physical Therapist- [facility]  02/11/22, 10:48 AM

## 2022-02-12 ENCOUNTER — Telehealth: Payer: Self-pay

## 2022-02-12 NOTE — Telephone Encounter (Signed)
Received fax from [facility] for cath supplies. Spoke with patient. He has urinary incontinence and leakage. He did not want cath but adult diapers and bed pads. Form faxed back to [facility] [employer] asking to send orders for that to review and denied cath supplies order.

## 2022-02-16 ENCOUNTER — Ambulatory Visit: Payer: Medicare HMO

## 2022-02-16 DIAGNOSIS — R262 Difficulty in walking, not elsewhere classified: Secondary | ICD-10-CM

## 2022-02-16 DIAGNOSIS — R2689 Other abnormalities of gait and mobility: Secondary | ICD-10-CM

## 2022-02-16 DIAGNOSIS — M6281 Muscle weakness (generalized): Secondary | ICD-10-CM

## 2022-02-16 DIAGNOSIS — R269 Unspecified abnormalities of gait and mobility: Secondary | ICD-10-CM

## 2022-02-16 DIAGNOSIS — R2681 Unsteadiness on feet: Secondary | ICD-10-CM

## 2022-02-16 DIAGNOSIS — R278 Other lack of coordination: Secondary | ICD-10-CM

## 2022-02-16 DIAGNOSIS — G8929 Other chronic pain: Secondary | ICD-10-CM

## 2022-02-16 NOTE — Therapy (Signed)
OUTPATIENT PHYSICAL THERAPY TREATMENT NOTE/Physical Therapy Progress Note   Dates of reporting period  12/29/2021   to   02/16/2022       Patient Name: [name] MRN: [id number] DOB:10-Feb-1946, 76 y.o., male Today's Date: 02/16/2022  PCP: [name]. [name], FNP REFERRING PROVIDER: Dr. Gurney Maxin   PT End of Session - 02/16/22 0934     Visit Number 80    Number of Visits 70    Date for PT Re-Evaluation 03/09/22    Authorization Type [employer]    Authorization Time Period 09/06/37-0/30/09; recert 23/30/0762- 04/06/3333    Progress Note Due on Visit 90    PT Start Time 0930    PT Stop Time 1013    PT Time Calculation (min) 43 min    Equipment Utilized During Treatment Gait belt    Activity Tolerance Patient tolerated treatment well;No increased pain    Behavior During Therapy [facility] for tasks assessed/performed                 Past Medical History:  Diagnosis Date   Anxiety    Arteriosclerosis of coronary artery 11/16/2011   Overview:  Stent 10/2011 stent rca 2015 with collaterals to lad which is chronically occluded    Benign enlargement of prostate    Benign essential HTN 06/11/2014   Benign prostatic hypertrophy without urinary obstruction 07/31/2014   Bilateral cataracts 05/16/2013   Overview:  Dr. [facility]     Bone spur of foot    Left   BP (high blood pressure) 11/09/2012   Cancer ([place])    skin (forehead) and bladder   Carotid artery narrowing 02/08/2014   Depression    Detrusor hypertrophy    Diabetes ([place])    Diabetes mellitus, type 2 ([place]) 12/12/2012   Diverticulosis    Dyspnea    Esophageal reflux    Esophageal reflux    Fothergill's neuralgia 08/07/2012   Overview:  [facility]    Gastritis    GERD (gastroesophageal reflux disease)    Headache    cluster headaches   Healed myocardial infarct 11/09/2012   Hearing loss in left ear    Heart disease    Hematuria    Hemorrhoids    History of hiatal hernia 12/14/2017   small     Hypercholesteremia    Lesion of bladder    Myocardial infarct (HCC)    Presence of stent in coronary artery 11/09/2012   Rectal bleeding    Trigeminal neuralgia    Trigeminal neuralgia    Valvular heart disease    Vitamin D deficiency    Past Surgical History:  Procedure Laterality Date   APPENDECTOMY     BOTOX INJECTION N/A 12/21/2017   Procedure: Bladder BOTOX INJECTION;  Surgeon: [name], MD;  Location: [facility] ORS;  Service: Urology;  Laterality: N/A;   BOTOX INJECTION N/A 09/11/2018   Procedure: Bladder BOTOX INJECTION;  Surgeon: [name], MD;  Location: [facility] OR[facility];  Service: Urology;  Laterality: N/A;   CARDIAC CATHETERIZATION     CARDIAC CATHETERIZATION N/A 01/22/2015   Procedure: Left Heart Cath;  Surgeon: Corey Skains, MD;  Location: [place] [facility];  Service: Cardiovascular;  Laterality: N/A;   CARDIAC CATHETERIZATION N/A 01/22/2015   Procedure: Coronary Stent Intervention;  Surgeon: Isaias Cowman, MD;  Location: [place] [facility];  Service: Cardiovascular;  Laterality: N/A;   CATARACT EXTRACTION, BILATERAL     COLONOSCOPY WITH PROPOFOL N/A 12/08/2015   Procedure: COLONOSCOPY WITH PROPOFOL;  Surgeon: Lollie Sails, MD;  Location: [facility] ENDOSCOPY;  Service: Endoscopy;  Laterality: N/A;   COLONOSCOPY WITH PROPOFOL N/A 12/09/2015   Procedure: COLONOSCOPY WITH PROPOFOL;  Surgeon: Lollie Sails, MD;  Location: [facility] ENDOSCOPY;  Service: Endoscopy;  Laterality: N/A;   CORONARY ANGIOPLASTY     5 stents   CORONARY STENT PLACEMENT  2015   x5   CYSTOSCOPY N/A 09/11/2018   Procedure: CYSTOSCOPY;  Surgeon: [name], MD;  Location: [facility] ORS;  Service: Urology;  Laterality: N/A;   CYSTOSCOPY WITH BIOPSY N/A 12/21/2017   Procedure: CYSTOSCOPY WITH BIOPSY;  Surgeon: [name], MD;  Location: [facility] OR[facility];  Service: Urology;  Laterality: N/A;   ESOPHAGOGASTRODUODENOSCOPY (EGD) WITH PROPOFOL N/A 12/08/2015   Procedure: ESOPHAGOGASTRODUODENOSCOPY (EGD)  WITH PROPOFOL;  Surgeon: Lollie Sails, MD;  Location: [facility] ENDOSCOPY;  Service: Endoscopy;  Laterality: N/A;   ESOPHAGOGASTRODUODENOSCOPY (EGD) WITH PROPOFOL N/A 12/13/2017   Procedure: ESOPHAGOGASTRODUODENOSCOPY (EGD) WITH PROPOFOL;  Surgeon: Lollie Sails, MD;  Location: [facility] ENDOSCOPY;  Service: Endoscopy;  Laterality: N/A;   EYE SURGERY     HERNIA REPAIR     kidney tumor remove     TRANSURETHRAL RESECTION OF BLADDER TUMOR WITH GYRUS (TURBT-GYRUS)  62/8315   UMBILICAL HERNIA REPAIR     urethral meatotomy     Patient Active Problem List   Diagnosis Date Noted   Class 2 obesity due to excess calories with body mass index (BMI) of 36.0 to 36.9 in adult 04/11/2020   Swelling of limb 03/28/2020   Lymphedema 03/28/2020   Trigeminal neuralgia 12/25/2019   Lower limb ulcer, calf, left, limited to breakdown of skin ([place]) 12/25/2019   OSA (obstructive sleep apnea) 17/61/6073   Acute systolic CHF (congestive heart failure) ([place]) 12/12/2018   Bruising 07/10/2018   Diet-controlled type 2 diabetes mellitus ([place]) 03/24/2018   Microalbuminuria 03/24/2018   Dizziness 11/17/2017   Simple chronic bronchitis ([place]) 09/22/2017   SOBOE (shortness of breath on exertion) 02/18/2017   Chronic midline low back pain without sciatica 12/29/2016   Periodic limb movement disorder 12/29/2016   Benign essential tremor 06/22/2016   Pure hypercholesterolemia 06/20/2015   Erectile dysfunction due to arterial insufficiency 06/16/2015   Unstable angina ([place]) 01/22/2015   Abdominal aortic 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 rupture ([place]) 12/31/2014   Benign prostatic hypertrophy without urinary obstruction 07/31/2014   Enlarged prostate 07/31/2014   Benign essential HTN 06/11/2014   Carotid artery narrowing 02/08/2014   Carotid artery obstruction 02/08/2014   Bilateral carotid artery stenosis 02/08/2014   Chest pain 08/20/2013   Bilateral cataracts 05/16/2013   Cataract 05/16/2013   Clinical depression 12/12/2012    Diabetes mellitus, type 2 ([place]) 12/12/2012   Combined fat and carbohydrate induced hyperlipemia 12/12/2012   Major depressive disorder, single episode, unspecified 12/12/2012   Acid reflux 11/09/2012   Presence of stent in coronary artery 11/09/2012   BP (high blood pressure) 11/09/2012   Healed myocardial infarct 11/09/2012   Gastro-esophageal reflux disease without esophagitis 11/09/2012   History of cardiovascular surgery 11/09/2012   Fothergill's neuralgia 08/07/2012   Swelling of testicle 04/06/2012   Disorder of male genital organ 04/06/2012   Swelling of the testicles 04/06/2012   Arteriosclerosis of coronary artery 11/16/2011   CAD in native artery 11/16/2011   Fatigue 06/04/2011   Avitaminosis D 11/30/2010    REFERRING DIAG: Repeated falls   THERAPY DIAG:   Difficulty in walking, not elsewhere classified  Muscle weakness (generalized)  Other lack of coordination  Abnormality of gait and  mobility  Other abnormalities of gait and mobility  Unsteadiness on feet  Chronic bilateral low back pain without sciatica  Rationale for Evaluation and Treatment Rehabilitation   Subjective Assessment - 09/22/21 0936     Subjective Patient reports having a muscle cramp on right side but otherwise doing well.    Patient is accompained by: Family member -Wife   Pertinent History Patient is a 76 year old male with referral for Physical Therapy with diagnosis of Parkinsons disease and history of falling. He reports he diagnosed last year with Parkinsons and has improved his tremors with use of medication but reports worsening shuffling and difficulty with mobility. Patient has past medical history signifiant for Parkinsons; Arthritis, bladder cancer, Trigemic Neuralgia. He lives with wife in 1 level home and current using cane with reported recent fall.    Limitations Walking;Lifting;Standing;House hold activities    How long can you sit comfortably? No limits    How long can you  stand comfortably? 15 min- limited due to back pain and foot numbess    How long can you walk comfortably? about 80 feet    Patient Stated Goals Improve my balance, be able to get up off the floor so I can maybe work in my garage again.    Currently in Pain? No/denies               PRECAUTIONS: Falls   PAIN:   No pain reported.     TODAY'S TREATMENT: 02/16/2022   There.ex:   NuStep INTERVAL Training L1-3 for 5 min for improved LE strength, coordination and endurance training. Total time = 5 min and 0.18 mi- closely monitored for any sign of pain/fatigue.  Sit to stand x 15 without UE support- focusing on forward trunk lean eccentric control with sitting  Trunk ext (thoracic) seated with 1/2 foam placed mid back region x 15 reps  Seated thoracic rotation with arms crossed to focus on ROM x 15 reps each direction  Standing at doorframe - crossed arm rhomboid/middle trap stretch - hold 20 sec x 3    Theract:   Reassessed the following functional outcome measures today - TUG, 6 min walk, 10 meter walk test- see goal section for specifics.              PATIENT EDUCATION: Education details: form/technique with exercise Person educated: Patient Education method: Explanation, Demonstration, Tactile cues, and Verbal cues Education comprehension: verbalized understanding, returned demonstration, verbal cues required, tactile cues required, and needs further education   HOME EXERCISE PROGRAM: No updates today   PT Short Term Goals -       PT SHORT TERM GOAL #1   Title Pt will be independent with INITIAL  HEP in order to improve strength and balance in order to decrease fall risk and improve function at home and work.    Baseline 04/07/2021= No formal HEP in place. 05/11/2021= Patient reports doing pretty good- compliant with walking program and his stretching/strengthening.    Time 6    Period Weeks    Status Achieved    Target Date 05/19/21      PT SHORT TERM  GOAL #2   Title Pt will decrease 5TSTS by at least 3 seconds in order to demonstrate clinically significant improvement in LE strength.    Baseline 04/07/2021= 25 sec; 05/11/2021= 18.65 sec    Time 6    Period Weeks    Status Achieved    Target Date 06/30/21  PT Long Term Goals -       PT LONG TERM GOAL #1   Title Pt will be independent with FINAL HEP in order to improve strength and balance in order to decrease fall risk and improve function at home and work.    Baseline 04/07/2021= No formal HEP in place. 07/01/2021=Patient reports performing walking and some standing LE strengthening exercises as instructed and states no questions at this time.  09/02/21: Pt reports he feels indep with exercises but is not performing HEP as much as he should; 11/17/2021= Patient reports compliant with current HEP- "I do them as best I can." 12/15/2021- Patient reports doing some exercises and walking but not feeling well in past week and denied performing HEP. Will keep goal active to ensure compliance and update HEP as appropriate.    Time 12    Period Weeks    Status Partially Met    Target Date 03/09/2022     PT LONG TERM GOAL #2   Title Pt will improve FOTO to target score of 45% to display perceived improvements in ability to complete ADL's.    Baseline 2/7/22023= 41%; FOTO=53%    Time 12    Period Weeks    Status Achieved    Target Date 06/30/21      PT LONG TERM GOAL #3   Title Pt will decrease 5TSTS by at least 5 seconds in order to demonstrate clinically significant improvement in LE strength.    Baseline 04/07/2021= 25 sec; 05/11/2021= 18.65 sec without UE support; 4/10= 13.76 sec without UE support    Time 12    Period Weeks    Status Achieved    Target Date 06/30/21      PT LONG TERM GOAL #4   Title Pt will decrease TUG to below 17 seconds/decrease in order to demonstrate decreased fall risk.    Baseline 04/07/2021= 21 sec with SPC; 05/11/2021= 18.85 sec with use of cane;  07/21/2021=18.88 sec  avg with use of cane; 7/5: 22.85 sec with QC; 09/29/2021= 21.45 sec using upright walker; 11/17/2021= 20. 46 sec using upright walker; 10/17= 24 sec with upright walker; 02/16/2022= 19.85 sec and 18.95 sec = 19.4 sec using 4WW   Time 12    Period Weeks    Status Partially Met    Target Date 03/09/2022     PT LONG TERM GOAL #5   Title Pt will increase 10MWT by at least 0.15 m/s in order to demonstrate clinically significant improvement in community ambulation.    Baseline 04/07/2021= 0.56 m/s using SPC; 05/11/2021= 0.54 m/s using SPC; 07/01/2021= 0.6 m/s/ 7/5: 0.48 m/s without AD but with CGA; 09/29/2021= 0.64 m/s; 11/17/2021= 0.61 m/s using upright 4WW; 10/17= 0.59 m/s; 12/29/2021= 0.64 m/s; 12/19= 0.72 ms = will keep goal active to ensure consistency   Time 12    Period Weeks    Status Progressing    Target Date 03/09/2022     PT LONG TERM GOAL #6   Title Pt will increase 6MWT by at least 27m(1668f in order to demonstrate clinically significant improvement in cardiopulmonary endurance and community ambulation    Baseline 06/08/2021= 600 ft. in 5 min- Stopped due to fatigue using 4WW; 07/01/2021= 665 in 5 min 45 sec using 4WW; 7/5: 367 ft with QC; 09/29/2021= 740 feet using upright 4WW; 11/17/2021= 705 feet using upright 4WW; 12/15/2021= 680 feet with upright 4WW; 02/16/2022= 790 feet with use of upright 4WW- will keep goal active to ensure consistent.  Time 12   Period Weeks    Status Progressing   Target Date 03/09/2022              Plan - 08/20/21 3557     Clinical Impression Statement Patient presented with excellent motivation for today's assessment visit. He continues to progress with mobility- including decreased time with TUG and 10 meter walk test indicating a decreased risk of falling using upright walker. He also presented with increased gait distance and less VC for step length. Will keep goals active to ensure he is consistent. Patient's condition has the potential to  improve in response to therapy. Maximum improvement is yet to be obtained. The anticipated improvement is attainable and reasonable in a generally predictable time. Patient will continue to benefit from skilled therapy to address remaining deficits in order to improve overall QoL and return to PLOF.        Personal Factors and Comorbidities Comorbidity 3+    Comorbidities arthritis, bladder cancer, Trigeminal Neuralgia    Examination-Activity Limitations Caring for Others;Carry;Continence;Lift;Squat;Stairs;Stand    Examination-Participation Restrictions Community Activity;Yard Work    [employer] Evolving/Moderate complexity    Rehab Potential Good    PT Frequency 2x / week    PT Duration 12 weeks    PT Treatment/Interventions ADLs/Self Care Home Management;Cryotherapy;Canalith Repostioning;Electrical Stimulation;Moist Heat;DME Instruction;Gait training;Stair training;Functional mobility training;Therapeutic activities;Therapeutic exercise;Balance training;Neuromuscular re-education;Patient/family education;Manual techniques;Passive range of motion;Dry needling;Vestibular    PT Next Visit Plan LE strength, improve step length with ambulation, muscle tissue lengthening    PT Home Exercise Plan Access Code: Bruce, PT Physical Therapist- [facility]  02/16/22, 10:51 AM

## 2022-02-18 ENCOUNTER — Ambulatory Visit: Payer: Medicare HMO

## 2022-02-18 DIAGNOSIS — R262 Difficulty in walking, not elsewhere classified: Secondary | ICD-10-CM | POA: Diagnosis not present

## 2022-02-18 DIAGNOSIS — R269 Unspecified abnormalities of gait and mobility: Secondary | ICD-10-CM

## 2022-02-18 DIAGNOSIS — M6281 Muscle weakness (generalized): Secondary | ICD-10-CM

## 2022-02-18 DIAGNOSIS — R278 Other lack of coordination: Secondary | ICD-10-CM

## 2022-02-18 NOTE — Therapy (Signed)
OUTPATIENT PHYSICAL THERAPY TREATMENT NOTE     Patient Name: [name] MRN: [id number] DOB:10-05-1945, 76 y.o., male Today's Date: 02/18/2022  PCP: [name]. [name], FNP REFERRING PROVIDER: Dr. Gurney Maxin   PT End of Session - 02/18/22 0922     Visit Number 53    Number of Visits 92    Date for PT Re-Evaluation 03/09/22    Authorization Type [employer]    Authorization Time Period 03/06/05-3/71/06; recert 26/94/8546- 04/07/348    Progress Note Due on Visit 90    PT Start Time 0926    PT Stop Time 1010    PT Time Calculation (min) 44 min    Equipment Utilized During Treatment Gait belt    Activity Tolerance Patient tolerated treatment well;No increased pain    Behavior During Therapy [facility] for tasks assessed/performed                 Past Medical History:  Diagnosis Date   Anxiety    Arteriosclerosis of coronary artery 11/16/2011   Overview:  Stent 10/2011 stent rca 2015 with collaterals to lad which is chronically occluded    Benign enlargement of prostate    Benign essential HTN 06/11/2014   Benign prostatic hypertrophy without urinary obstruction 07/31/2014   Bilateral cataracts 05/16/2013   Overview:  Dr. [facility]     Bone spur of foot    Left   BP (high blood pressure) 11/09/2012   Cancer ([place])    skin (forehead) and bladder   Carotid artery narrowing 02/08/2014   Depression    Detrusor hypertrophy    Diabetes ([place])    Diabetes mellitus, type 2 ([place]) 12/12/2012   Diverticulosis    Dyspnea    Esophageal reflux    Esophageal reflux    Fothergill's neuralgia 08/07/2012   Overview:  [facility]    Gastritis    GERD (gastroesophageal reflux disease)    Headache    cluster headaches   Healed myocardial infarct 11/09/2012   Hearing loss in left ear    Heart disease    Hematuria    Hemorrhoids    History of hiatal hernia 12/14/2017   small    Hypercholesteremia    Lesion of bladder    Myocardial infarct (HCC)    Presence of stent in  coronary artery 11/09/2012   Rectal bleeding    Trigeminal neuralgia    Trigeminal neuralgia    Valvular heart disease    Vitamin D deficiency    Past Surgical History:  Procedure Laterality Date   APPENDECTOMY     BOTOX INJECTION N/A 12/21/2017   Procedure: Bladder BOTOX INJECTION;  Surgeon: [name], MD;  Location: [facility] ORS;  Service: Urology;  Laterality: N/A;   BOTOX INJECTION N/A 09/11/2018   Procedure: Bladder BOTOX INJECTION;  Surgeon: [name], MD;  Location: [facility] ORS;  Service: Urology;  Laterality: N/A;   CARDIAC CATHETERIZATION     CARDIAC CATHETERIZATION N/A 01/22/2015   Procedure: Left Heart Cath;  Surgeon: Corey Skains, MD;  Location: [place] CV LAB;  Service: Cardiovascular;  Laterality: N/A;   CARDIAC CATHETERIZATION N/A 01/22/2015   Procedure: Coronary Stent Intervention;  Surgeon: Isaias Cowman, MD;  Location: [place] CV LAB;  Service: Cardiovascular;  Laterality: N/A;   CATARACT EXTRACTION, BILATERAL     COLONOSCOPY WITH PROPOFOL N/A 12/08/2015   Procedure: COLONOSCOPY WITH PROPOFOL;  Surgeon: Lollie Sails, MD;  Location: [facility]COPY;  Service: Endoscopy;  Laterality: N/A;   COLONOSCOPY  WITH PROPOFOL N/A 12/09/2015   Procedure: COLONOSCOPY WITH PROPOFOL;  Surgeon: Lollie Sails, MD;  Location: [facility] ENDOSCOPY;  Service: Endoscopy;  Laterality: N/A;   CORONARY ANGIOPLASTY     5 stents   CORONARY STENT PLACEMENT  2015   x5   CYSTOSCOPY N/A 09/11/2018   Procedure: CYSTOSCOPY;  Surgeon: [name], MD;  Location: [facility] ORS;  Service: Urology;  Laterality: N/A;   CYSTOSCOPY WITH BIOPSY N/A 12/21/2017   Procedure: CYSTOSCOPY WITH BIOPSY;  Surgeon: [name], MD;  Location: [facility] ORS;  Service: Urology;  Laterality: N/A;   ESOPHAGOGASTRODUODENOSCOPY (EGD) WITH PROPOFOL N/A 12/08/2015   Procedure: ESOPHAGOGASTRODUODENOSCOPY (EGD) WITH PROPOFOL;  Surgeon: Lollie Sails, MD;  Location: [facility]COPY;  Service:  Endoscopy;  Laterality: N/A;   ESOPHAGOGASTRODUODENOSCOPY (EGD) WITH PROPOFOL N/A 12/13/2017   Procedure: ESOPHAGOGASTRODUODENOSCOPY (EGD) WITH PROPOFOL;  Surgeon: Lollie Sails, MD;  Location: [facility] ENDOSCOPY;  Service: Endoscopy;  Laterality: N/A;   EYE SURGERY     HERNIA REPAIR     kidney tumor remove     TRANSURETHRAL RESECTION OF BLADDER TUMOR WITH GYRUS (TURBT-GYRUS)  56/4332   UMBILICAL HERNIA REPAIR     urethral meatotomy     Patient Active Problem List   Diagnosis Date Noted   Class 2 obesity due to excess calories with body mass index (BMI) of 36.0 to 36.9 in adult 04/11/2020   Swelling of limb 03/28/2020   Lymphedema 03/28/2020   Trigeminal neuralgia 12/25/2019   Lower limb ulcer, calf, left, limited to breakdown of skin ([place]) 12/25/2019   OSA (obstructive sleep apnea) 95/18/8416   Acute systolic CHF (congestive heart failure) ([place]) 12/12/2018   Bruising 07/10/2018   Diet-controlled type 2 diabetes mellitus ([place]) 03/24/2018   Microalbuminuria 03/24/2018   Dizziness 11/17/2017   Simple chronic bronchitis ([place]) 09/22/2017   SOBOE (shortness of breath on exertion) 02/18/2017   Chronic midline low back pain without sciatica 12/29/2016   Periodic limb movement disorder 12/29/2016   Benign essential tremor 06/22/2016   Pure hypercholesterolemia 06/20/2015   Erectile dysfunction due to arterial insufficiency 06/16/2015   Unstable angina ([place]) 01/22/2015   Abdominal aortic 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 rupture ([place]) 12/31/2014   Benign prostatic hypertrophy without urinary obstruction 07/31/2014   Enlarged prostate 07/31/2014   Benign essential HTN 06/11/2014   Carotid artery narrowing 02/08/2014   Carotid artery obstruction 02/08/2014   Bilateral carotid artery stenosis 02/08/2014   Chest pain 08/20/2013   Bilateral cataracts 05/16/2013   Cataract 05/16/2013   Clinical depression 12/12/2012   Diabetes mellitus, type 2 ([place]) 12/12/2012   Combined fat and carbohydrate  induced hyperlipemia 12/12/2012   Major depressive disorder, single episode, unspecified 12/12/2012   Acid reflux 11/09/2012   Presence of stent in coronary artery 11/09/2012   BP (high blood pressure) 11/09/2012   Healed myocardial infarct 11/09/2012   Gastro-esophageal reflux disease without esophagitis 11/09/2012   History of cardiovascular surgery 11/09/2012   Fothergill's neuralgia 08/07/2012   Swelling of testicle 04/06/2012   Disorder of male genital organ 04/06/2012   Swelling of the testicles 04/06/2012   Arteriosclerosis of coronary artery 11/16/2011   CAD in native artery 11/16/2011   Fatigue 06/04/2011   Avitaminosis D 11/30/2010    REFERRING DIAG: Repeated falls   THERAPY DIAG:   Difficulty in walking, not elsewhere classified  Muscle weakness (generalized)  Other lack of coordination  Abnormality of gait and mobility  Rationale for Evaluation and Treatment Rehabilitation   Subjective Assessment - 09/22/21 0936  Subjective Pt reports being happy with progress from last session. Denies falls. Still feels like his legs are weak.   Patient is accompained by: Family member -Wife   Pertinent History Patient is a 76 year old male with referral for Physical Therapy with diagnosis of Parkinsons disease and history of falling. He reports he diagnosed last year with Parkinsons and has improved his tremors with use of medication but reports worsening shuffling and difficulty with mobility. Patient has past medical history signifiant for Parkinsons; Arthritis, bladder cancer, Trigemic Neuralgia. He lives with wife in 1 level home and current using cane with reported recent fall.    Limitations Walking;Lifting;Standing;House hold activities    How long can you sit comfortably? No limits    How long can you stand comfortably? 15 min- limited due to back pain and foot numbess    How long can you walk comfortably? about 80 feet    Patient Stated Goals Improve my balance, be  able to get up off the floor so I can maybe work in my garage again.    Currently in Pain? No/denies               PRECAUTIONS: Falls   PAIN:   No pain reported.     TODAY'S TREATMENT: 02/18/2022   There.ex:   NuStep INTERVAL Training L3 for 5 min for improved LE strength, coordination and endurance training.   Gait with upright walker with 3#AW's for general strengthening and endurance with gait. 2x150'. SBA. Min to mod VC's for reducing shuffling gait with RLE. Good ability to improve step heights.   STS with 2 KG med ball: 2x12. With overhead raises in standing.   6" step up with contralateral hip flexion in // bars with light SUE support: 2x6/LE. Seated rest b/t sets. Min to mod VC's for improved carryover.   Resisted side steps with 3# AW's.  x5 laps in // bars per side. BUE support on bar for support.     PATIENT EDUCATION: Education details: form/technique with exercise Person educated: Patient Education method: Explanation, Demonstration, Tactile cues, and Verbal cues Education comprehension: verbalized understanding, returned demonstration, verbal cues required, tactile cues required, and needs further education   HOME EXERCISE PROGRAM: No updates today   PT Short Term Goals -       PT SHORT TERM GOAL #1   Title Pt will be independent with INITIAL  HEP in order to improve strength and balance in order to decrease fall risk and improve function at home and work.    Baseline 04/07/2021= No formal HEP in place. 05/11/2021= Patient reports doing pretty good- compliant with walking program and his stretching/strengthening.    Time 6    Period Weeks    Status Achieved    Target Date 05/19/21      PT SHORT TERM GOAL #2   Title Pt will decrease 5TSTS by at least 3 seconds in order to demonstrate clinically significant improvement in LE strength.    Baseline 04/07/2021= 25 sec; 05/11/2021= 18.65 sec    Time 6    Period Weeks    Status Achieved    Target Date  06/30/21              PT Long Term Goals -       PT LONG TERM GOAL #1   Title Pt will be independent with FINAL HEP in order to improve strength and balance in order to decrease fall risk and improve function at home and  work.    Baseline 04/07/2021= No formal HEP in place. 07/01/2021=Patient reports performing walking and some standing LE strengthening exercises as instructed and states no questions at this time.  09/02/21: Pt reports he feels indep with exercises but is not performing HEP as much as he should; 11/17/2021= Patient reports compliant with current HEP- "I do them as best I can." 12/15/2021- Patient reports doing some exercises and walking but not feeling well in past week and denied performing HEP. Will keep goal active to ensure compliance and update HEP as appropriate.    Time 12    Period Weeks    Status Partially Met    Target Date 03/09/2022     PT LONG TERM GOAL #2   Title Pt will improve FOTO to target score of 45% to display perceived improvements in ability to complete ADL's.    Baseline 2/7/22023= 41%; FOTO=53%    Time 12    Period Weeks    Status Achieved    Target Date 06/30/21      PT LONG TERM GOAL #3   Title Pt will decrease 5TSTS by at least 5 seconds in order to demonstrate clinically significant improvement in LE strength.    Baseline 04/07/2021= 25 sec; 05/11/2021= 18.65 sec without UE support; 4/10= 13.76 sec without UE support    Time 12    Period Weeks    Status Achieved    Target Date 06/30/21      PT LONG TERM GOAL #4   Title Pt will decrease TUG to below 17 seconds/decrease in order to demonstrate decreased fall risk.    Baseline 04/07/2021= 21 sec with SPC; 05/11/2021= 18.85 sec with use of cane; 07/21/2021=18.88 sec  avg with use of cane; 7/5: 22.85 sec with QC; 09/29/2021= 21.45 sec using upright walker; 11/17/2021= 20. 46 sec using upright walker; 10/17= 24 sec with upright walker; 02/16/2022= 19.85 sec and 18.95 sec = 19.4 sec using 4WW   Time 12     Period Weeks    Status Partially Met    Target Date 03/09/2022     PT LONG TERM GOAL #5   Title Pt will increase 10MWT by at least 0.15 m/s in order to demonstrate clinically significant improvement in community ambulation.    Baseline 04/07/2021= 0.56 m/s using SPC; 05/11/2021= 0.54 m/s using SPC; 07/01/2021= 0.6 m/s/ 7/5: 0.48 m/s without AD but with CGA; 09/29/2021= 0.64 m/s; 11/17/2021= 0.61 m/s using upright 4WW; 10/17= 0.59 m/s; 12/29/2021= 0.64 m/s; 12/19= 0.72 ms = will keep goal active to ensure consistency   Time 12    Period Weeks    Status Progressing    Target Date 03/09/2022     PT LONG TERM GOAL #6   Title Pt will increase 6MWT by at least 65m(1661f in order to demonstrate clinically significant improvement in cardiopulmonary endurance and community ambulation    Baseline 06/08/2021= 600 ft. in 5 min- Stopped due to fatigue using 4WW; 07/01/2021= 665 in 5 min 45 sec using 4WW; 7/5: 367 ft with QC; 09/29/2021= 740 feet using upright 4WW; 11/17/2021= 705 feet using upright 4WW; 12/15/2021= 680 feet with upright 4WW; 02/16/2022= 790 feet with use of upright 4WW- will keep goal active to ensure consistent.    Time 12   Period Weeks    Status Progressing   Target Date 03/09/2022              Plan - 08/20/21 0939     Clinical Impression Statement Focus of session  this date on progressive LE strengthening as pt making excellent progress towards goals per previous progress note. Incorporating SLS and single leg strengthening with requirement of BUE support. Pt will continue to benefit from skilled PT services to progress dynamic strength/ balance to maximize functional capacity and reduced falls risk.     Personal Factors and Comorbidities Comorbidity 3+    Comorbidities arthritis, bladder cancer, Trigeminal Neuralgia    Examination-Activity Limitations Caring for Others;Carry;Continence;Lift;Squat;Stairs;Stand    Examination-Participation Restrictions Community Activity;Yard Work     [employer] Evolving/Moderate complexity    Rehab Potential Good    PT Frequency 2x / week    PT Duration 12 weeks    PT Treatment/Interventions ADLs/Self Care Home Management;Cryotherapy;Canalith Repostioning;Electrical Stimulation;Moist Heat;DME Instruction;Gait training;Stair training;Functional mobility training;Therapeutic activities;Therapeutic exercise;Balance training;Neuromuscular re-education;Patient/family education;Manual techniques;Passive range of motion;Dry needling;Vestibular    PT Next Visit Plan LE strength, improve step length with ambulation, muscle tissue lengthening    PT Home Exercise Plan Access Code: [email or web address]            Salem Caster. Fairly IV, PT, DPT Physical Therapist- [facility]  02/18/22, 10:11 AM

## 2022-02-23 ENCOUNTER — Ambulatory Visit: Payer: Medicare HMO

## 2022-02-23 DIAGNOSIS — R262 Difficulty in walking, not elsewhere classified: Secondary | ICD-10-CM | POA: Diagnosis not present

## 2022-02-23 DIAGNOSIS — R2689 Other abnormalities of gait and mobility: Secondary | ICD-10-CM

## 2022-02-23 DIAGNOSIS — M6281 Muscle weakness (generalized): Secondary | ICD-10-CM

## 2022-02-23 DIAGNOSIS — R269 Unspecified abnormalities of gait and mobility: Secondary | ICD-10-CM

## 2022-02-23 DIAGNOSIS — M545 Low back pain, unspecified: Secondary | ICD-10-CM

## 2022-02-23 DIAGNOSIS — R2681 Unsteadiness on feet: Secondary | ICD-10-CM

## 2022-02-23 DIAGNOSIS — R278 Other lack of coordination: Secondary | ICD-10-CM

## 2022-02-23 NOTE — Therapy (Signed)
OUTPATIENT PHYSICAL THERAPY TREATMENT NOTE     Patient Name: [name] MRN: [id number] DOB:June 28, 1945, 76 y.o., male Today's Date: 02/23/2022  PCP: [name]. [name], FNP REFERRING PROVIDER: Dr. Gurney Maxin   PT End of Session - 02/23/22 0937     Visit Number 82    Number of Visits 71    Date for PT Re-Evaluation 03/09/22    Authorization Type [employer]    Authorization Time Period 5/0/51-8/33/58; recert 25/18/9842- 1/0/3128    Progress Note Due on Visit 90    PT Start Time 0930    PT Stop Time 1013    PT Time Calculation (min) 43 min    Equipment Utilized During Treatment Gait belt    Activity Tolerance Patient tolerated treatment well;No increased pain    Behavior During Therapy [facility] for tasks assessed/performed                  Past Medical History:  Diagnosis Date   Anxiety    Arteriosclerosis of coronary artery 11/16/2011   Overview:  Stent 10/2011 stent rca 2015 with collaterals to lad which is chronically occluded    Benign enlargement of prostate    Benign essential HTN 06/11/2014   Benign prostatic hypertrophy without urinary obstruction 07/31/2014   Bilateral cataracts 05/16/2013   Overview:  Dr. [facility]     Bone spur of foot    Left   BP (high blood pressure) 11/09/2012   Cancer ([place])    skin (forehead) and bladder   Carotid artery narrowing 02/08/2014   Depression    Detrusor hypertrophy    Diabetes ([place])    Diabetes mellitus, type 2 ([place]) 12/12/2012   Diverticulosis    Dyspnea    Esophageal reflux    Esophageal reflux    Fothergill's neuralgia 08/07/2012   Overview:  [facility]    Gastritis    GERD (gastroesophageal reflux disease)    Headache    cluster headaches   Healed myocardial infarct 11/09/2012   Hearing loss in left ear    Heart disease    Hematuria    Hemorrhoids    History of hiatal hernia 12/14/2017   small    Hypercholesteremia    Lesion of bladder    Myocardial infarct (HCC)    Presence of stent in  coronary artery 11/09/2012   Rectal bleeding    Trigeminal neuralgia    Trigeminal neuralgia    Valvular heart disease    Vitamin D deficiency    Past Surgical History:  Procedure Laterality Date   APPENDECTOMY     BOTOX INJECTION N/A 12/21/2017   Procedure: Bladder BOTOX INJECTION;  Surgeon: [name], MD;  Location: [facility] ORS;  Service: Urology;  Laterality: N/A;   BOTOX INJECTION N/A 09/11/2018   Procedure: Bladder BOTOX INJECTION;  Surgeon: [name], MD;  Location: [facility] ORS;  Service: Urology;  Laterality: N/A;   CARDIAC CATHETERIZATION     CARDIAC CATHETERIZATION N/A 01/22/2015   Procedure: Left Heart Cath;  Surgeon: Corey Skains, MD;  Location: [place] [facility];  Service: Cardiovascular;  Laterality: N/A;   CARDIAC CATHETERIZATION N/A 01/22/2015   Procedure: Coronary Stent Intervention;  Surgeon: Isaias Cowman, MD;  Location: [place] [facility];  Service: Cardiovascular;  Laterality: N/A;   CATARACT EXTRACTION, BILATERAL     COLONOSCOPY WITH PROPOFOL N/A 12/08/2015   Procedure: COLONOSCOPY WITH PROPOFOL;  Surgeon: Lollie Sails, MD;  Location: [facility] ENDOSCOPY;  Service: Endoscopy;  Laterality: N/A;  COLONOSCOPY WITH PROPOFOL N/A 12/09/2015   Procedure: COLONOSCOPY WITH PROPOFOL;  Surgeon: Lollie Sails, MD;  Location: [facility];  Service: Endoscopy;  Laterality: N/A;   CORONARY ANGIOPLASTY     5 stents   CORONARY STENT PLACEMENT  2015   x5   CYSTOSCOPY N/A 09/11/2018   Procedure: CYSTOSCOPY;  Surgeon: [name], MD;  Location: [facility] ORS;  Service: Urology;  Laterality: N/A;   CYSTOSCOPY WITH BIOPSY N/A 12/21/2017   Procedure: CYSTOSCOPY WITH BIOPSY;  Surgeon: [name], MD;  Location: [facility] OR[facility];  Service: Urology;  Laterality: N/A;   ESOPHAGOGASTRODUODENOSCOPY (EGD) WITH PROPOFOL N/A 12/08/2015   Procedure: ESOPHAGOGASTRODUODENOSCOPY (EGD) WITH PROPOFOL;  Surgeon: Lollie Sails, MD;  Location: [facility] ENDOSCOPY;  Service:  Endoscopy;  Laterality: N/A;   ESOPHAGOGASTRODUODENOSCOPY (EGD) WITH PROPOFOL N/A 12/13/2017   Procedure: ESOPHAGOGASTRODUODENOSCOPY (EGD) WITH PROPOFOL;  Surgeon: Lollie Sails, MD;  Location: [facility] ENDOSCOPY;  Service: Endoscopy;  Laterality: N/A;   EYE SURGERY     HERNIA REPAIR     kidney tumor remove     TRANSURETHRAL RESECTION OF BLADDER TUMOR WITH GYRUS (TURBT-GYRUS)  12/9145   UMBILICAL HERNIA REPAIR     urethral meatotomy     Patient Active Problem List   Diagnosis Date Noted   Class 2 obesity due to excess calories with body mass index (BMI) of 36.0 to 36.9 in adult 04/11/2020   Swelling of limb 03/28/2020   Lymphedema 03/28/2020   Trigeminal neuralgia 12/25/2019   Lower limb ulcer, calf, left, limited to breakdown of skin ([place]) 12/25/2019   OSA (obstructive sleep apnea) 82/95/6213   Acute systolic CHF (congestive heart failure) ([place]) 12/12/2018   Bruising 07/10/2018   Diet-controlled type 2 diabetes mellitus ([place]) 03/24/2018   Microalbuminuria 03/24/2018   Dizziness 11/17/2017   Simple chronic bronchitis ([place]) 09/22/2017   SOBOE (shortness of breath on exertion) 02/18/2017   Chronic midline low back pain without sciatica 12/29/2016   Periodic limb movement disorder 12/29/2016   Benign essential tremor 06/22/2016   Pure hypercholesterolemia 06/20/2015   Erectile dysfunction due to arterial insufficiency 06/16/2015   Unstable angina ([place]) 01/22/2015   Abdominal aortic 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 rupture ([place]) 12/31/2014   Benign prostatic hypertrophy without urinary obstruction 07/31/2014   Enlarged prostate 07/31/2014   Benign essential HTN 06/11/2014   Carotid artery narrowing 02/08/2014   Carotid artery obstruction 02/08/2014   Bilateral carotid artery stenosis 02/08/2014   Chest pain 08/20/2013   Bilateral cataracts 05/16/2013   Cataract 05/16/2013   Clinical depression 12/12/2012   Diabetes mellitus, type 2 ([place]) 12/12/2012   Combined fat and carbohydrate  induced hyperlipemia 12/12/2012   Major depressive disorder, single episode, unspecified 12/12/2012   Acid reflux 11/09/2012   Presence of stent in coronary artery 11/09/2012   BP (high blood pressure) 11/09/2012   Healed myocardial infarct 11/09/2012   Gastro-esophageal reflux disease without esophagitis 11/09/2012   History of cardiovascular surgery 11/09/2012   Fothergill's neuralgia 08/07/2012   Swelling of testicle 04/06/2012   Disorder of male genital organ 04/06/2012   Swelling of the testicles 04/06/2012   Arteriosclerosis of coronary artery 11/16/2011   CAD in native artery 11/16/2011   Fatigue 06/04/2011   Avitaminosis D 11/30/2010    REFERRING DIAG: Repeated falls   THERAPY DIAG:   Difficulty in walking, not elsewhere classified  Muscle weakness (generalized)  Other lack of coordination  Abnormality of gait and mobility  Other abnormalities of gait and mobility  Unsteadiness on feet  Chronic bilateral low back  pain without sciatica  Rationale for Evaluation and Treatment Rehabilitation   Subjective Assessment - 09/22/21 0936     Subjective Patient reports doing well overall- States he had a good Christmas and no falls.   Patient is accompained by: Family member -Wife   Pertinent History Patient is a 76 year old male with referral for Physical Therapy with diagnosis of Parkinsons disease and history of falling. He reports he diagnosed last year with Parkinsons and has improved his tremors with use of medication but reports worsening shuffling and difficulty with mobility. Patient has past medical history signifiant for Parkinsons; Arthritis, bladder cancer, Trigemic Neuralgia. He lives with wife in 1 level home and current using cane with reported recent fall.    Limitations Walking;Lifting;Standing;House hold activities    How long can you sit comfortably? No limits    How long can you stand comfortably? 15 min- limited due to back pain and foot numbess    How  long can you walk comfortably? about 80 feet    Patient Stated Goals Improve my balance, be able to get up off the floor so I can maybe work in my garage again.    Currently in Pain? No/denies               PRECAUTIONS: Falls   PAIN:   No pain reported.     TODAY'S TREATMENT: 02/23/2022   There.ex:   NuStep INTERVAL Training L1 for 1 min 30 sec L3 for 1 min  L2 for 1 min  L5 for 1 min L1 for 1 min 30 sec Total distance = 0.19 for 6 min for improved LE strength, coordination and endurance training.   STS with 2 KG med ball: 2x12. With overhead raises in standing.    NMR:   Activity Description: Step tap for reaction time and initiation of movement Activity Setting:  The Thomas setting was selected to refine precision and concentration, isolating specific muscle groups or movements to enhance overall coordination and targeted muscle engagement.  Number of Pods:  4 (one target and 3 distracting)  Cycles/Sets:  3 each Leg Duration (Time or Hit Count):  10  Patient Stats  Reaction Time:  Right Leg tap 1) 12:50 sec; 2)  12.03 sec; 3) 11:46 sec Left leg tap= 1) 15.66 2) 13.69 3) 12.5 0 sec  Activity Description: step tap - walk forward/backward in // bars with 6 pods  Activity Setting:  The Blaze Pod Random setting was chosen to enhance cognitive processing and agility, providing an unpredictable environment to simulate real-world scenarios, and fostering quick reactions and adaptability.   Number of Pods:  6 Cycles/Sets:  3 Duration (Time or Hit Count):  10 hit count  Patient Stats  Reaction Time:  1) 35.4 sec 2) 34.58 3) 33.58 sec  Obstacles in // bars-  forward stepping over orange hurdle then step block (6") and then 1/2 foam down and back x 4 with 2-3 missteps but improved with practice.  Obstacles in // bars-  Side stepping over orange hurdle then step block (6") and then 1/2 foam down and back x 4 with 2-3 missteps but improved with practice.      PATIENT EDUCATION: Education details: form/technique with exercise Person educated: Patient Education method: Explanation, Demonstration, Tactile cues, and Verbal cues Education comprehension: verbalized understanding, returned demonstration, verbal cues required, tactile cues required, and needs further education   HOME EXERCISE PROGRAM: No updates today   PT Short Term Goals -  PT SHORT TERM GOAL #1   Title Pt will be independent with INITIAL  HEP in order to improve strength and balance in order to decrease fall risk and improve function at home and work.    Baseline 04/07/2021= No formal HEP in place. 05/11/2021= Patient reports doing pretty good- compliant with walking program and his stretching/strengthening.    Time 6    Period Weeks    Status Achieved    Target Date 05/19/21      PT SHORT TERM GOAL #2   Title Pt will decrease 5TSTS by at least 3 seconds in order to demonstrate clinically significant improvement in LE strength.    Baseline 04/07/2021= 25 sec; 05/11/2021= 18.65 sec    Time 6    Period Weeks    Status Achieved    Target Date 06/30/21              PT Long Term Goals -       PT LONG TERM GOAL #1   Title Pt will be independent with FINAL HEP in order to improve strength and balance in order to decrease fall risk and improve function at home and work.    Baseline 04/07/2021= No formal HEP in place. 07/01/2021=Patient reports performing walking and some standing LE strengthening exercises as instructed and states no questions at this time.  09/02/21: Pt reports he feels indep with exercises but is not performing HEP as much as he should; 11/17/2021= Patient reports compliant with current HEP- "I do them as best I can." 12/15/2021- Patient reports doing some exercises and walking but not feeling well in past week and denied performing HEP. Will keep goal active to ensure compliance and update HEP as appropriate.    Time 12    Period Weeks    Status Partially Met     Target Date 03/09/2022     PT LONG TERM GOAL #2   Title Pt will improve FOTO to target score of 45% to display perceived improvements in ability to complete ADL's.    Baseline 2/7/22023= 41%; FOTO=53%    Time 12    Period Weeks    Status Achieved    Target Date 06/30/21      PT LONG TERM GOAL #3   Title Pt will decrease 5TSTS by at least 5 seconds in order to demonstrate clinically significant improvement in LE strength.    Baseline 04/07/2021= 25 sec; 05/11/2021= 18.65 sec without UE support; 4/10= 13.76 sec without UE support    Time 12    Period Weeks    Status Achieved    Target Date 06/30/21      PT LONG TERM GOAL #4   Title Pt will decrease TUG to below 17 seconds/decrease in order to demonstrate decreased fall risk.    Baseline 04/07/2021= 21 sec with SPC; 05/11/2021= 18.85 sec with use of cane; 07/21/2021=18.88 sec  avg with use of cane; 7/5: 22.85 sec with QC; 09/29/2021= 21.45 sec using upright walker; 11/17/2021= 20. 46 sec using upright walker; 10/17= 24 sec with upright walker; 02/16/2022= 19.85 sec and 18.95 sec = 19.4 sec using 4WW   Time 12    Period Weeks    Status Partially Met    Target Date 03/09/2022     PT LONG TERM GOAL #5   Title Pt will increase 10MWT by at least 0.15 m/s in order to demonstrate clinically significant improvement in community ambulation.    Baseline 04/07/2021= 0.56 m/s using SPC; 05/11/2021= 0.54 m/s using  SPC; 07/01/2021= 0.6 m/s/ 7/5: 0.48 m/s without AD but with CGA; 09/29/2021= 0.64 m/s; 11/17/2021= 0.61 m/s using upright 4WW; 10/17= 0.59 m/s; 12/29/2021= 0.64 m/s; 12/19= 0.72 ms = will keep goal active to ensure consistency   Time 12    Period Weeks    Status Progressing    Target Date 03/09/2022     PT LONG TERM GOAL #6   Title Pt will increase 6MWT by at least 34m(1638f in order to demonstrate clinically significant improvement in cardiopulmonary endurance and community ambulation    Baseline 06/08/2021= 600 ft. in 5 min- Stopped due to fatigue using  4WW; 07/01/2021= 665 in 5 min 45 sec using 4WW; 7/5: 367 ft with QC; 09/29/2021= 740 feet using upright 4WW; 11/17/2021= 705 feet using upright 4WW; 12/15/2021= 680 feet with upright 4WW; 02/16/2022= 790 feet with use of upright 4WW- will keep goal active to ensure consistent.    Time 12   Period Weeks    Status Progressing   Target Date 03/09/2022              Plan - 08/20/21 [id number]   Clinical Impression Statement Patient presents with good motivation and demo improved overall stepping today. He exhibited progress with reaction time and good initiation of movement with very minima hesitation with visual cues. He was later able to demo improved step height and length as seen by good ability to step over obstacles in bars today.   Pt will continue to benefit from skilled PT services to progress dynamic strength/ balance to maximize functional capacity and reduced falls risk.     Personal Factors and Comorbidities Comorbidity 3+    Comorbidities arthritis, bladder cancer, Trigeminal Neuralgia    Examination-Activity Limitations Caring for Others;Carry;Continence;Lift;Squat;Stairs;Stand    Examination-Participation Restrictions Community Activity;Yard Work    [employer] officervolving/Moderate complexity    Rehab Potential Good    PT Frequency 2x / week    PT Duration 12 weeks    PT Treatment/Interventions ADLs/Self Care Home Management;Cryotherapy;Canalith Repostioning;Electrical Stimulation;Moist Heat;DME Instruction;Gait training;Stair training;Functional mobility training;Therapeutic activities;Therapeutic exercise;Balance training;Neuromuscular re-education;Patient/family education;Manual techniques;Passive range of motion;Dry needling;Vestibular    PT Next Visit Plan LE strength, improve step length with ambulation, muscle tissue lengthening    PT Home Exercise Plan Access Code: [email or web address] Physical Therapist- CoWestgreen [facility] LLC12/26/23, 1:01 PM

## 2022-02-25 ENCOUNTER — Ambulatory Visit: Payer: Medicare HMO

## 2022-02-25 DIAGNOSIS — R262 Difficulty in walking, not elsewhere classified: Secondary | ICD-10-CM | POA: Diagnosis not present

## 2022-02-25 DIAGNOSIS — M545 Low back pain, unspecified: Secondary | ICD-10-CM

## 2022-02-25 DIAGNOSIS — R269 Unspecified abnormalities of gait and mobility: Secondary | ICD-10-CM

## 2022-02-25 DIAGNOSIS — M6281 Muscle weakness (generalized): Secondary | ICD-10-CM

## 2022-02-25 DIAGNOSIS — R278 Other lack of coordination: Secondary | ICD-10-CM

## 2022-02-25 DIAGNOSIS — R2681 Unsteadiness on feet: Secondary | ICD-10-CM

## 2022-02-25 DIAGNOSIS — R2689 Other abnormalities of gait and mobility: Secondary | ICD-10-CM

## 2022-02-25 NOTE — Therapy (Signed)
OUTPATIENT PHYSICAL THERAPY TREATMENT NOTE     Patient Name: [name] MRN: [id number] DOB:1946/02/09, 76 y.o., male Today's Date: 02/25/2022  PCP: [name]. [name], FNP REFERRING PROVIDER: Dr. Gurney Maxin   PT End of Session - 02/25/22 0920     Visit Number 57    Number of Visits 40    Date for PT Re-Evaluation 03/09/22    Authorization Type [employer]    Authorization Time Period 09/04/26-05/14/15; recert 61/60/7371- 0/07/2692    Progress Note Due on Visit 90    PT Start Time 0920    PT Stop Time 1006    PT Time Calculation (min) 46 min    Equipment Utilized During Treatment Gait belt    Activity Tolerance Patient tolerated treatment well;No increased pain    Behavior During Therapy [facility] for tasks assessed/performed                  Past Medical History:  Diagnosis Date   Anxiety    Arteriosclerosis of coronary artery 11/16/2011   Overview:  Stent 10/2011 stent rca 2015 with collaterals to lad which is chronically occluded    Benign enlargement of prostate    Benign essential HTN 06/11/2014   Benign prostatic hypertrophy without urinary obstruction 07/31/2014   Bilateral cataracts 05/16/2013   Overview:  Dr. [facility]     Bone spur of foot    Left   BP (high blood pressure) 11/09/2012   Cancer ([place])    skin (forehead) and bladder   Carotid artery narrowing 02/08/2014   Depression    Detrusor hypertrophy    Diabetes ([place])    Diabetes mellitus, type 2 ([place]) 12/12/2012   Diverticulosis    Dyspnea    Esophageal reflux    Esophageal reflux    Fothergill's neuralgia 08/07/2012   Overview:  [facility]    Gastritis    GERD (gastroesophageal reflux disease)    Headache    cluster headaches   Healed myocardial infarct 11/09/2012   Hearing loss in left ear    Heart disease    Hematuria    Hemorrhoids    History of hiatal hernia 12/14/2017   small    Hypercholesteremia    Lesion of bladder    Myocardial infarct (HCC)    Presence of stent in  coronary artery 11/09/2012   Rectal bleeding    Trigeminal neuralgia    Trigeminal neuralgia    Valvular heart disease    Vitamin D deficiency    Past Surgical History:  Procedure Laterality Date   APPENDECTOMY     BOTOX INJECTION N/A 12/21/2017   Procedure: Bladder BOTOX INJECTION;  Surgeon: [name], MD;  Location: [facility];  Service: Urology;  Laterality: N/A;   BOTOX INJECTION N/A 09/11/2018   Procedure: Bladder BOTOX INJECTION;  Surgeon: [name], MD;  Location: [facility];  Service: Urology;  Laterality: N/A;   CARDIAC CATHETERIZATION     CARDIAC CATHETERIZATION N/A 01/22/2015   Procedure: Left Heart Cath;  Surgeon: Corey Skains, MD;  Location: [place] [facility];  Service: Cardiovascular;  Laterality: N/A;   CARDIAC CATHETERIZATION N/A 01/22/2015   Procedure: Coronary Stent Intervention;  Surgeon: Isaias Cowman, MD;  Location: [place] [facility];  Service: Cardiovascular;  Laterality: N/A;   CATARACT EXTRACTION, BILATERAL     COLONOSCOPY WITH PROPOFOL N/A 12/08/2015   Procedure: COLONOSCOPY WITH PROPOFOL;  Surgeon: Lollie Sails, MD;  Location: [facility];  Service: Endoscopy;  Laterality: N/A;  COLONOSCOPY WITH PROPOFOL N/A 12/09/2015   Procedure: COLONOSCOPY WITH PROPOFOL;  Surgeon: Lollie Sails, MD;  Location: [facility];  Service: Endoscopy;  Laterality: N/A;   CORONARY ANGIOPLASTY     5 stents   CORONARY STENT PLACEMENT  2015   x5   CYSTOSCOPY N/A 09/11/2018   Procedure: CYSTOSCOPY;  Surgeon: [name], MD;  Location: [facility];  Service: Urology;  Laterality: N/A;   CYSTOSCOPY WITH BIOPSY N/A 12/21/2017   Procedure: CYSTOSCOPY WITH BIOPSY;  Surgeon: [name], MD;  Location: [facility];  Service: Urology;  Laterality: N/A;   ESOPHAGOGASTRODUODENOSCOPY (EGD) WITH PROPOFOL N/A 12/08/2015   Procedure: ESOPHAGOGASTRODUODENOSCOPY (EGD) WITH PROPOFOL;  Surgeon: Lollie Sails, MD;  Location: [facility] ENDOSCOPY;  Service:  Endoscopy;  Laterality: N/A;   ESOPHAGOGASTRODUODENOSCOPY (EGD) WITH PROPOFOL N/A 12/13/2017   Procedure: ESOPHAGOGASTRODUODENOSCOPY (EGD) WITH PROPOFOL;  Surgeon: Lollie Sails, MD;  Location: [facility] ENDOSCOPY;  Service: Endoscopy;  Laterality: N/A;   EYE SURGERY     HERNIA REPAIR     kidney tumor remove     TRANSURETHRAL RESECTION OF BLADDER TUMOR WITH GYRUS (TURBT-GYRUS)  12/9145   UMBILICAL HERNIA REPAIR     urethral meatotomy     Patient Active Problem List   Diagnosis Date Noted   Class 2 obesity due to excess calories with body mass index (BMI) of 36.0 to 36.9 in adult 04/11/2020   Swelling of limb 03/28/2020   Lymphedema 03/28/2020   Trigeminal neuralgia 12/25/2019   Lower limb ulcer, calf, left, limited to breakdown of skin ([place]) 12/25/2019   OSA (obstructive sleep apnea) 82/95/6213   Acute systolic CHF (congestive heart failure) ([place]) 12/12/2018   Bruising 07/10/2018   Diet-controlled type 2 diabetes mellitus ([place]) 03/24/2018   Microalbuminuria 03/24/2018   Dizziness 11/17/2017   Simple chronic bronchitis ([place]) 09/22/2017   SOBOE (shortness of breath on exertion) 02/18/2017   Chronic midline low back pain without sciatica 12/29/2016   Periodic limb movement disorder 12/29/2016   Benign essential tremor 06/22/2016   Pure hypercholesterolemia 06/20/2015   Erectile dysfunction due to arterial insufficiency 06/16/2015   Unstable angina ([place]) 01/22/2015   Abdominal aortic 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 rupture ([place]) 12/31/2014   Benign prostatic hypertrophy without urinary obstruction 07/31/2014   Enlarged prostate 07/31/2014   Benign essential HTN 06/11/2014   Carotid artery narrowing 02/08/2014   Carotid artery obstruction 02/08/2014   Bilateral carotid artery stenosis 02/08/2014   Chest pain 08/20/2013   Bilateral cataracts 05/16/2013   Cataract 05/16/2013   Clinical depression 12/12/2012   Diabetes mellitus, type 2 ([place]) 12/12/2012   Combined fat and carbohydrate  induced hyperlipemia 12/12/2012   Major depressive disorder, single episode, unspecified 12/12/2012   Acid reflux 11/09/2012   Presence of stent in coronary artery 11/09/2012   BP (high blood pressure) 11/09/2012   Healed myocardial infarct 11/09/2012   Gastro-esophageal reflux disease without esophagitis 11/09/2012   History of cardiovascular surgery 11/09/2012   Fothergill's neuralgia 08/07/2012   Swelling of testicle 04/06/2012   Disorder of male genital organ 04/06/2012   Swelling of the testicles 04/06/2012   Arteriosclerosis of coronary artery 11/16/2011   CAD in native artery 11/16/2011   Fatigue 06/04/2011   Avitaminosis D 11/30/2010    REFERRING DIAG: Repeated falls   THERAPY DIAG:   Difficulty in walking, not elsewhere classified  Muscle weakness (generalized)  Other lack of coordination  Abnormality of gait and mobility  Other abnormalities of gait and mobility  Unsteadiness on feet  Chronic bilateral low back  pain without sciatica  Rationale for Evaluation and Treatment Rehabilitation   Subjective Assessment - 09/22/21 0936     Subjective Patient reports doing well overall- States he had a good Christmas and no falls.   Patient is accompained by: Family member -Wife   Pertinent History Patient is a 76 year old male with referral for Physical Therapy with diagnosis of Parkinsons disease and history of falling. He reports he diagnosed last year with Parkinsons and has improved his tremors with use of medication but reports worsening shuffling and difficulty with mobility. Patient has past medical history signifiant for Parkinsons; Arthritis, bladder cancer, Trigemic Neuralgia. He lives with wife in 1 level home and current using cane with reported recent fall.    Limitations Walking;Lifting;Standing;House hold activities    How long can you sit comfortably? No limits    How long can you stand comfortably? 15 min- limited due to back pain and foot numbess    How  long can you walk comfortably? about 80 feet    Patient Stated Goals Improve my balance, be able to get up off the floor so I can maybe work in my garage again.    Currently in Pain? No/denies               PRECAUTIONS: Falls   PAIN:   No pain reported.     TODAY'S TREATMENT: 12/272023   There.ex:   NuStep INTERVAL Training L1 for 1 min 30 sec L3 for 1 min  L2 for 1 min  L5 for 2 min L1 for 1 min 30 sec Total distance = 0.19 for 7 min for improved LE strength, coordination and endurance training.   STS while holding onto  6.7 lb.  med ball: 2x12.     NMR:   Dynamic forward/backward step up/over 1/2 foam x 20 reps.  Obstacle course in // bars- Forward step up/over 1/2 foam then orange hurdle, then up onto airex pad followed by 10 dynamic high knee marches without UE support then off -turn around and repeat back x 5 trials- Very unsteady with marching but improved each trial- exhibiting only fatigue at end of session.  Obstacle course in // bars- Side step  Step and reach overhead to sticky notes positioned left of center and right of center   Gait velocity= measured over 4 rounds of 10 meter walk using upright walker 1) 16.52 sec 2) 17.17 sec 3) 15.88 sec 4) 16.12 sec       PATIENT EDUCATION: Education details: form/technique with exercise Person educated: Patient Education method: Explanation, Demonstration, Tactile cues, and Verbal cues Education comprehension: verbalized understanding, returned demonstration, verbal cues required, tactile cues required, and needs further education   HOME EXERCISE PROGRAM: No updates today   PT Short Term Goals -       PT SHORT TERM GOAL #1   Title Pt will be independent with INITIAL  HEP in order to improve strength and balance in order to decrease fall risk and improve function at home and work.    Baseline 04/07/2021= No formal HEP in place. 05/11/2021= Patient reports doing pretty good- compliant with walking  program and his stretching/strengthening.    Time 6    Period Weeks    Status Achieved    Target Date 05/19/21      PT SHORT TERM GOAL #2   Title Pt will decrease 5TSTS by at least 3 seconds in order to demonstrate clinically significant improvement in LE strength.    Baseline  04/07/2021= 25 sec; 05/11/2021= 18.65 sec    Time 6    Period Weeks    Status Achieved    Target Date 06/30/21              PT Long Term Goals -       PT LONG TERM GOAL #1   Title Pt will be independent with FINAL HEP in order to improve strength and balance in order to decrease fall risk and improve function at home and work.    Baseline 04/07/2021= No formal HEP in place. 07/01/2021=Patient reports performing walking and some standing LE strengthening exercises as instructed and states no questions at this time.  09/02/21: Pt reports he feels indep with exercises but is not performing HEP as much as he should; 11/17/2021= Patient reports compliant with current HEP- "I do them as best I can." 12/15/2021- Patient reports doing some exercises and walking but not feeling well in past week and denied performing HEP. Will keep goal active to ensure compliance and update HEP as appropriate.    Time 12    Period Weeks    Status Partially Met    Target Date 03/09/2022     PT LONG TERM GOAL #2   Title Pt will improve FOTO to target score of 45% to display perceived improvements in ability to complete ADL's.    Baseline 2/7/22023= 41%; FOTO=53%    Time 12    Period Weeks    Status Achieved    Target Date 06/30/21      PT LONG TERM GOAL #3   Title Pt will decrease 5TSTS by at least 5 seconds in order to demonstrate clinically significant improvement in LE strength.    Baseline 04/07/2021= 25 sec; 05/11/2021= 18.65 sec without UE support; 4/10= 13.76 sec without UE support    Time 12    Period Weeks    Status Achieved    Target Date 06/30/21      PT LONG TERM GOAL #4   Title Pt will decrease TUG to below 17  seconds/decrease in order to demonstrate decreased fall risk.    Baseline 04/07/2021= 21 sec with SPC; 05/11/2021= 18.85 sec with use of cane; 07/21/2021=18.88 sec  avg with use of cane; 7/5: 22.85 sec with QC; 09/29/2021= 21.45 sec using upright walker; 11/17/2021= 20. 46 sec using upright walker; 10/17= 24 sec with upright walker; 02/16/2022= 19.85 sec and 18.95 sec = 19.4 sec using 4WW   Time 12    Period Weeks    Status Partially Met    Target Date 03/09/2022     PT LONG TERM GOAL #5   Title Pt will increase 10MWT by at least 0.15 m/s in order to demonstrate clinically significant improvement in community ambulation.    Baseline 04/07/2021= 0.56 m/s using SPC; 05/11/2021= 0.54 m/s using SPC; 07/01/2021= 0.6 m/s/ 7/5: 0.48 m/s without AD but with CGA; 09/29/2021= 0.64 m/s; 11/17/2021= 0.61 m/s using upright 4WW; 10/17= 0.59 m/s; 12/29/2021= 0.64 m/s; 12/19= 0.72 ms = will keep goal active to ensure consistency   Time 12    Period Weeks    Status Progressing    Target Date 03/09/2022     PT LONG TERM GOAL #6   Title Pt will increase 6MWT by at least 44m(1645f in order to demonstrate clinically significant improvement in cardiopulmonary endurance and community ambulation    Baseline 06/08/2021= 600 ft. in 5 min- Stopped due to fatigue using 4WW; 07/01/2021= 665 in 5 min 45 sec using  6KG; 7/5: 367 ft with QC; 09/29/2021= 740 feet using upright 4WW; 11/17/2021= 705 feet using upright 4WW; 12/15/2021= 680 feet with upright 4WW; 02/16/2022= 790 feet with use of upright 4WW- will keep goal active to ensure consistent.    Time 12   Period Weeks    Status Progressing   Target Date 03/09/2022              Plan - 08/20/21 0939     Clinical Impression Statement Treatment continued to focus on dynamic balance and improving step length. Patient was able to clear all objects demo good use of visual input to assist with step length/height. No LOB with stepping over objects and only unsteady with dynamic marching on airex.  He presented with fairly consistent gait speed using his walker and demo good step length without evidence of shuffling.  Pt will continue to benefit from skilled PT services to progress dynamic strength/ balance to maximize functional capacity and reduced falls risk.     Personal Factors and Comorbidities Comorbidity 3+    Comorbidities arthritis, bladder cancer, Trigeminal Neuralgia    Examination-Activity Limitations Caring for Others;Carry;Continence;Lift;Squat;Stairs;Stand    Examination-Participation Restrictions Community Activity;Yard Work    [employer] Evolving/Moderate complexity    Rehab Potential Good    PT Frequency 2x / week    PT Duration 12 weeks    PT Treatment/Interventions ADLs/Self Care Home Management;Cryotherapy;Canalith Repostioning;Electrical Stimulation;Moist Heat;DME Instruction;Gait training;Stair training;Functional mobility training;Therapeutic activities;Therapeutic exercise;Balance training;Neuromuscular re-education;Patient/family education;Manual techniques;Passive range of motion;Dry needling;Vestibular    PT Next Visit Plan LE strength, improve step length with ambulation, muscle tissue lengthening    PT Home Exercise Plan Access Code: [place], PT Physical Therapist- [facility]  02/25/22, 10:27 AM

## 2022-03-02 ENCOUNTER — Ambulatory Visit: Payer: Medicare HMO | Attending: Neurology

## 2022-03-02 DIAGNOSIS — R262 Difficulty in walking, not elsewhere classified: Secondary | ICD-10-CM | POA: Insufficient documentation

## 2022-03-02 DIAGNOSIS — M545 Low back pain, unspecified: Secondary | ICD-10-CM | POA: Insufficient documentation

## 2022-03-02 DIAGNOSIS — R2681 Unsteadiness on feet: Secondary | ICD-10-CM | POA: Diagnosis present

## 2022-03-02 DIAGNOSIS — R2689 Other abnormalities of gait and mobility: Secondary | ICD-10-CM | POA: Diagnosis present

## 2022-03-02 DIAGNOSIS — M6281 Muscle weakness (generalized): Secondary | ICD-10-CM | POA: Insufficient documentation

## 2022-03-02 DIAGNOSIS — R278 Other lack of coordination: Secondary | ICD-10-CM | POA: Insufficient documentation

## 2022-03-02 DIAGNOSIS — G8929 Other chronic pain: Secondary | ICD-10-CM | POA: Diagnosis present

## 2022-03-02 DIAGNOSIS — R269 Unspecified abnormalities of gait and mobility: Secondary | ICD-10-CM | POA: Insufficient documentation

## 2022-03-02 NOTE — Therapy (Signed)
OUTPATIENT PHYSICAL THERAPY TREATMENT NOTE     Patient Name: ROSALIE GELPI MRN: [id number] DOB:21-May-1945, 77 y.o., male Today's Date: 03/02/2022  PCP: [name]. [name], FNP REFERRING PROVIDER: Dr. Gurney Maxin   PT End of Session - 03/02/22 0941     Visit Number 9    Number of Visits 70    Date for PT Re-Evaluation 03/09/22    Authorization Type [employer]    Authorization Time Period 3/0/86-5/78/46; recert 96/29/5284- 03/03/2438    Progress Note Due on Visit 90    PT Start Time 0934    PT Stop Time 1015    PT Time Calculation (min) 41 min    Equipment Utilized During Treatment Gait belt    Activity Tolerance Patient tolerated treatment well;No increased pain    Behavior During Therapy [facility] for tasks assessed/performed                  Past Medical History:  Diagnosis Date   Anxiety    Arteriosclerosis of coronary artery 11/16/2011   Overview:  Stent 10/2011 stent rca 2015 with collaterals to lad which is chronically occluded    Benign enlargement of prostate    Benign essential HTN 06/11/2014   Benign prostatic hypertrophy without urinary obstruction 07/31/2014   Bilateral cataracts 05/16/2013   Overview:  Dr. [facility]     Bone spur of foot    Left   BP (high blood pressure) 11/09/2012   Cancer ([place])    skin (forehead) and bladder   Carotid artery narrowing 02/08/2014   Depression    Detrusor hypertrophy    Diabetes ([place])    Diabetes mellitus, type 2 ([place]) 12/12/2012   Diverticulosis    Dyspnea    Esophageal reflux    Esophageal reflux    Fothergill's neuralgia 08/07/2012   Overview:  [facility]    Gastritis    GERD (gastroesophageal reflux disease)    Headache    cluster headaches   Healed myocardial infarct 11/09/2012   Hearing loss in left ear    Heart disease    Hematuria    Hemorrhoids    History of hiatal hernia 12/14/2017   small    Hypercholesteremia    Lesion of bladder    Myocardial infarct (HCC)    Presence of stent in  coronary artery 11/09/2012   Rectal bleeding    Trigeminal neuralgia    Trigeminal neuralgia    Valvular heart disease    Vitamin D deficiency    Past Surgical History:  Procedure Laterality Date   APPENDECTOMY     BOTOX INJECTION N/A 12/21/2017   Procedure: Bladder BOTOX INJECTION;  Surgeon: [name], MD;  Location: [facility] ORS;  Service: Urology;  Laterality: N/A;   BOTOX INJECTION N/A 09/11/2018   Procedure: Bladder BOTOX INJECTION;  Surgeon: [name], MD;  Location: [facility] ORS;  Service: Urology;  Laterality: N/A;   CARDIAC CATHETERIZATION     CARDIAC CATHETERIZATION N/A 01/22/2015   Procedure: Left Heart Cath;  Surgeon: Corey Skains, MD;  Location: [place] CV LAB;  Service: Cardiovascular;  Laterality: N/A;   CARDIAC CATHETERIZATION N/A 01/22/2015   Procedure: Coronary Stent Intervention;  Surgeon: Isaias Cowman, MD;  Location: [place] CV LAB;  Service: Cardiovascular;  Laterality: N/A;   CATARACT EXTRACTION, BILATERAL     COLONOSCOPY WITH PROPOFOL N/A 12/08/2015   Procedure: COLONOSCOPY WITH PROPOFOL;  Surgeon: Lollie Sails, MD;  Location: [facility] ENDOSCOPY;  Service: Endoscopy;  Laterality: N/A;  COLONOSCOPY WITH PROPOFOL N/A 12/09/2015   Procedure: COLONOSCOPY WITH PROPOFOL;  Surgeon: Lollie Sails, MD;  Location: [facility]COPY;  Service: Endoscopy;  Laterality: N/A;   CORONARY ANGIOPLASTY     5 stents   CORONARY STENT PLACEMENT  2015   x5   CYSTOSCOPY N/A 09/11/2018   Procedure: CYSTOSCOPY;  Surgeon: [name], MD;  Location: [facility] ORS;  Service: Urology;  Laterality: N/A;   CYSTOSCOPY WITH BIOPSY N/A 12/21/2017   Procedure: CYSTOSCOPY WITH BIOPSY;  Surgeon: [name], MD;  Location: [facility] ORS;  Service: Urology;  Laterality: N/A;   ESOPHAGOGASTRODUODENOSCOPY (EGD) WITH PROPOFOL N/A 12/08/2015   Procedure: ESOPHAGOGASTRODUODENOSCOPY (EGD) WITH PROPOFOL;  Surgeon: Lollie Sails, MD;  Location: [facility] ENDOSCOPY;  Service:  Endoscopy;  Laterality: N/A;   ESOPHAGOGASTRODUODENOSCOPY (EGD) WITH PROPOFOL N/A 12/13/2017   Procedure: ESOPHAGOGASTRODUODENOSCOPY (EGD) WITH PROPOFOL;  Surgeon: Lollie Sails, MD;  Location: [facility] ENDOSCOPY;  Service: Endoscopy;  Laterality: N/A;   EYE SURGERY     HERNIA REPAIR     kidney tumor remove     TRANSURETHRAL RESECTION OF BLADDER TUMOR WITH GYRUS (TURBT-GYRUS)  12/9145   UMBILICAL HERNIA REPAIR     urethral meatotomy     Patient Active Problem List   Diagnosis Date Noted   Class 2 obesity due to excess calories with body mass index (BMI) of 36.0 to 36.9 in adult 04/11/2020   Swelling of limb 03/28/2020   Lymphedema 03/28/2020   Trigeminal neuralgia 12/25/2019   Lower limb ulcer, calf, left, limited to breakdown of skin ([place]) 12/25/2019   OSA (obstructive sleep apnea) 82/95/6213   Acute systolic CHF (congestive heart failure) ([place]) 12/12/2018   Bruising 07/10/2018   Diet-controlled type 2 diabetes mellitus ([place]) 03/24/2018   Microalbuminuria 03/24/2018   Dizziness 11/17/2017   Simple chronic bronchitis ([place]) 09/22/2017   SOBOE (shortness of breath on exertion) 02/18/2017   Chronic midline low back pain without sciatica 12/29/2016   Periodic limb movement disorder 12/29/2016   Benign essential tremor 06/22/2016   Pure hypercholesterolemia 06/20/2015   Erectile dysfunction due to arterial insufficiency 06/16/2015   Unstable angina ([place]) 01/22/2015   Abdominal aortic 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 rupture ([place]) 12/31/2014   Benign prostatic hypertrophy without urinary obstruction 07/31/2014   Enlarged prostate 07/31/2014   Benign essential HTN 06/11/2014   Carotid artery narrowing 02/08/2014   Carotid artery obstruction 02/08/2014   Bilateral carotid artery stenosis 02/08/2014   Chest pain 08/20/2013   Bilateral cataracts 05/16/2013   Cataract 05/16/2013   Clinical depression 12/12/2012   Diabetes mellitus, type 2 ([place]) 12/12/2012   Combined fat and carbohydrate  induced hyperlipemia 12/12/2012   Major depressive disorder, single episode, unspecified 12/12/2012   Acid reflux 11/09/2012   Presence of stent in coronary artery 11/09/2012   BP (high blood pressure) 11/09/2012   Healed myocardial infarct 11/09/2012   Gastro-esophageal reflux disease without esophagitis 11/09/2012   History of cardiovascular surgery 11/09/2012   Fothergill's neuralgia 08/07/2012   Swelling of testicle 04/06/2012   Disorder of male genital organ 04/06/2012   Swelling of the testicles 04/06/2012   Arteriosclerosis of coronary artery 11/16/2011   CAD in native artery 11/16/2011   Fatigue 06/04/2011   Avitaminosis D 11/30/2010    REFERRING DIAG: Repeated falls   THERAPY DIAG:   Difficulty in walking, not elsewhere classified  Muscle weakness (generalized)  Other lack of coordination  Abnormality of gait and mobility  Other abnormalities of gait and mobility  Unsteadiness on feet  Chronic bilateral low back  pain without sciatica  Rationale for Evaluation and Treatment Rehabilitation   Subjective Assessment - 09/22/21 0936     Subjective Patient reports feeling okay with no new complaints- States having both good and bad days walking.    Patient is accompained by: Family member -Wife   Pertinent History Patient is a 77 year old male with referral for Physical Therapy with diagnosis of Parkinsons disease and history of falling. He reports he diagnosed last year with Parkinsons and has improved his tremors with use of medication but reports worsening shuffling and difficulty with mobility. Patient has past medical history signifiant for Parkinsons; Arthritis, bladder cancer, Trigemic Neuralgia. He lives with wife in 1 level home and current using cane with reported recent fall.    Limitations Walking;Lifting;Standing;House hold activities    How long can you sit comfortably? No limits    How long can you stand comfortably? 15 min- limited due to back pain and  foot numbess    How long can you walk comfortably? about 80 feet    Patient Stated Goals Improve my balance, be able to get up off the floor so I can maybe work in my garage again.    Currently in Pain? No/denies               PRECAUTIONS: Falls   PAIN:   No pain reported.     TODAY'S TREATMENT:   03/02/2022   There.ex:   NuStep INTERVAL Training L1 for 1 min 30 sec L3 for 1 min  L2 for 1 min  L5 for 2 min L1 for 1 min 30 sec Total distance = 0.19 for 7 min for improved LE strength, coordination and endurance training.   Reviewed LE strengthening for pending discharge next week:   Sit to stand with overhead UE raises x 12 reps   Standing hip ext 2-sets of 12 reps  Standing hip abd (side step walk in // bars) - down and back x 5 Standing calf raises - 2 sets of 12 reps.  Standing march walk in // bars x 4 reps       PATIENT EDUCATION: Education details: form/technique with exercise Person educated: Patient Education method: Explanation, Demonstration, Tactile cues, and Verbal cues Education comprehension: verbalized understanding, returned demonstration, verbal cues required, tactile cues required, and needs further education   HOME EXERCISE PROGRAM: Access Code: [email or web address] URL: [email or web address] Date: 03/02/2022 Prepared by: Sande Brothers  Exercises - Sit to Stand Without Arm Support  - 1 x daily - 3 x weekly - 3 sets - 10 reps - Standing Thoracic Rotation with Dowel  - 1 x daily - 7 x weekly - 2 sets - 10 reps - Standing Hip Extension with Chair  - 1 x daily - 3 x weekly - 3 sets - 10 reps - Supine Bridge  - 1 x daily - 3 x weekly - 3 sets - 10 reps - Standing Heel Raises  - 1 x daily - 3 x weekly - 3 sets - 10 reps - Walking March  - 1 x daily - 7 x weekly - 5 sets - Sidestepping  - 1 x daily - 3 x weekly - 5 sets   PT Short Term Goals -       PT SHORT TERM GOAL #1   Title Pt will be independent with INITIAL  HEP in order  to improve strength and balance in order to decrease fall risk and improve function at home and work.  Baseline 04/07/2021= No formal HEP in place. 05/11/2021= Patient reports doing pretty good- compliant with walking program and his stretching/strengthening.    Time 6    Period Weeks    Status Achieved    Target Date 05/19/21      PT SHORT TERM GOAL #2   Title Pt will decrease 5TSTS by at least 3 seconds in order to demonstrate clinically significant improvement in LE strength.    Baseline 04/07/2021= 25 sec; 05/11/2021= 18.65 sec    Time 6    Period Weeks    Status Achieved    Target Date 06/30/21              PT Long Term Goals -       PT LONG TERM GOAL #1   Title Pt will be independent with FINAL HEP in order to improve strength and balance in order to decrease fall risk and improve function at home and work.    Baseline 04/07/2021= No formal HEP in place. 07/01/2021=Patient reports performing walking and some standing LE strengthening exercises as instructed and states no questions at this time.  09/02/21: Pt reports he feels indep with exercises but is not performing HEP as much as he should; 11/17/2021= Patient reports compliant with current HEP- "I do them as best I can." 12/15/2021- Patient reports doing some exercises and walking but not feeling well in past week and denied performing HEP. Will keep goal active to ensure compliance and update HEP as appropriate.    Time 12    Period Weeks    Status Partially Met    Target Date 03/09/2022     PT LONG TERM GOAL #2   Title Pt will improve FOTO to target score of 45% to display perceived improvements in ability to complete ADL's.    Baseline 2/7/22023= 41%; FOTO=53%    Time 12    Period Weeks    Status Achieved    Target Date 06/30/21      PT LONG TERM GOAL #3   Title Pt will decrease 5TSTS by at least 5 seconds in order to demonstrate clinically significant improvement in LE strength.    Baseline 04/07/2021= 25 sec; 05/11/2021= 18.65  sec without UE support; 4/10= 13.76 sec without UE support    Time 12    Period Weeks    Status Achieved    Target Date 06/30/21      PT LONG TERM GOAL #4   Title Pt will decrease TUG to below 17 seconds/decrease in order to demonstrate decreased fall risk.    Baseline 04/07/2021= 21 sec with SPC; 05/11/2021= 18.85 sec with use of cane; 07/21/2021=18.88 sec  avg with use of cane; 7/5: 22.85 sec with QC; 09/29/2021= 21.45 sec using upright walker; 11/17/2021= 20. 46 sec using upright walker; 10/17= 24 sec with upright walker; 02/16/2022= 19.85 sec and 18.95 sec = 19.4 sec using 4WW   Time 12    Period Weeks    Status Partially Met    Target Date 03/09/2022     PT LONG TERM GOAL #5   Title Pt will increase 10MWT by at least 0.15 m/s in order to demonstrate clinically significant improvement in community ambulation.    Baseline 04/07/2021= 0.56 m/s using SPC; 05/11/2021= 0.54 m/s using SPC; 07/01/2021= 0.6 m/s/ 7/5: 0.48 m/s without AD but with CGA; 09/29/2021= 0.64 m/s; 11/17/2021= 0.61 m/s using upright 4WW; 10/17= 0.59 m/s; 12/29/2021= 0.64 m/s; 12/19= 0.72 ms = will keep goal active to ensure consistency  Time 12    Period Weeks    Status Progressing    Target Date 03/09/2022     PT LONG TERM GOAL #6   Title Pt will increase 6MWT by at least 57m(1628f in order to demonstrate clinically significant improvement in cardiopulmonary endurance and community ambulation    Baseline 06/08/2021= 600 ft. in 5 min- Stopped due to fatigue using 4WW; 07/01/2021= 665 in 5 min 45 sec using 4WW; 7/5: 367 ft with QC; 09/29/2021= 740 feet using upright 4WW; 11/17/2021= 705 feet using upright 4WW; 12/15/2021= 680 feet with upright 4WW; 02/16/2022= 790 feet with use of upright 4WW- will keep goal active to ensure consistent.    Time 12   Period Weeks    Status Progressing   Target Date 03/09/2022              Plan - 08/20/21 [id number]   Clinical Impression Statement Patient presents with good motivation and participated  well in review of LE strengthening at home. He was able to perform all with only minor modification including crossing arms for thoracic rotation instead of dowel behind head.  Pt will continue to benefit from skilled PT services to progress dynamic strength/ balance to maximize functional capacity and reduced falls risk.     Personal Factors and Comorbidities Comorbidity 3+    Comorbidities arthritis, bladder cancer, Trigeminal Neuralgia    Examination-Activity Limitations Caring for Others;Carry;Continence;Lift;Squat;Stairs;Stand    Examination-Participation Restrictions Community Activity;Yard Work    [employer] officervolving/Moderate complexity    Rehab Potential Good    PT Frequency 2x / week    PT Duration 12 weeks    PT Treatment/Interventions ADLs/Self Care Home Management;Cryotherapy;Canalith Repostioning;Electrical Stimulation;Moist Heat;DME Instruction;Gait training;Stair training;Functional mobility training;Therapeutic activities;Therapeutic exercise;Balance training;Neuromuscular re-education;Patient/family education;Manual techniques;Passive range of motion;Dry needling;Vestibular    PT Next Visit Plan LE strength, improve step length with ambulation, muscle tissue lengthening    PT Home Exercise Plan Access Code: [email or web address] Physical Therapist- CoSouthern Idaho [facility] Center01/02/24, 11:29 AM

## 2022-03-04 ENCOUNTER — Ambulatory Visit: Payer: Medicare HMO

## 2022-03-04 DIAGNOSIS — R262 Difficulty in walking, not elsewhere classified: Secondary | ICD-10-CM

## 2022-03-04 DIAGNOSIS — R269 Unspecified abnormalities of gait and mobility: Secondary | ICD-10-CM

## 2022-03-04 DIAGNOSIS — R2681 Unsteadiness on feet: Secondary | ICD-10-CM

## 2022-03-04 DIAGNOSIS — R278 Other lack of coordination: Secondary | ICD-10-CM

## 2022-03-04 DIAGNOSIS — M6281 Muscle weakness (generalized): Secondary | ICD-10-CM

## 2022-03-04 DIAGNOSIS — R2689 Other abnormalities of gait and mobility: Secondary | ICD-10-CM

## 2022-03-04 NOTE — Therapy (Signed)
OUTPATIENT PHYSICAL THERAPY TREATMENT NOTE     Patient Name: [name] MRN: [id number] DOB:[birth year], 77 y.o., male Today's Date: 03/05/2022  PCP: [name]. [name], FNP REFERRING PROVIDER: Dr. Gurney Maxin   PT End of Session - 03/04/22 1058     Visit Number 101    Number of Visits 24    Date for PT Re-Evaluation 03/09/22    Authorization Type [employer]    Authorization Time Period 08/05/27-4/76/54; recert 65/04/5463- 08/06/1273    Progress Note Due on Visit 90    PT Start Time 0929    PT Stop Time 1014    PT Time Calculation (min) 45 min    Equipment Utilized During Treatment Gait belt    Activity Tolerance Patient tolerated treatment well;No increased pain    Behavior During Therapy [facility] for tasks assessed/performed                   Past Medical History:  Diagnosis Date   Anxiety    Arteriosclerosis of coronary artery 11/16/2011   Overview:  Stent 10/2011 stent rca 2015 with collaterals to lad which is chronically occluded    Benign enlargement of prostate    Benign essential HTN 06/11/2014   Benign prostatic hypertrophy without urinary obstruction 07/31/2014   Bilateral cataracts 05/16/2013   Overview:  Dr. [facility]     Bone spur of foot    Left   BP (high blood pressure) 11/09/2012   Cancer ([place])    skin (forehead) and bladder   Carotid artery narrowing 02/08/2014   Depression    Detrusor hypertrophy    Diabetes ([place])    Diabetes mellitus, type 2 ([place]) 12/12/2012   Diverticulosis    Dyspnea    Esophageal reflux    Esophageal reflux    Fothergill's neuralgia 08/07/2012   Overview:  [facility]    Gastritis    GERD (gastroesophageal reflux disease)    Headache    cluster headaches   Healed myocardial infarct 11/09/2012   Hearing loss in left ear    Heart disease    Hematuria    Hemorrhoids    History of hiatal hernia 12/14/2017   small    Hypercholesteremia    Lesion of bladder    Myocardial infarct (HCC)    Presence of stent in  coronary artery 11/09/2012   Rectal bleeding    Trigeminal neuralgia    Trigeminal neuralgia    Valvular heart disease    Vitamin D deficiency    Past Surgical History:  Procedure Laterality Date   APPENDECTOMY     BOTOX INJECTION N/A 12/21/2017   Procedure: Bladder BOTOX INJECTION;  Surgeon: [name], MD;  Location: [facility];  Service: Urology;  Laterality: N/A;   BOTOX INJECTION N/A 09/11/2018   Procedure: Bladder BOTOX INJECTION;  Surgeon: [name], MD;  Location: [facility];  Service: Urology;  Laterality: N/A;   CARDIAC CATHETERIZATION     CARDIAC CATHETERIZATION N/A 01/22/2015   Procedure: Left Heart Cath;  Surgeon: Corey Skains, MD;  Location: [place] [facility];  Service: Cardiovascular;  Laterality: N/A;   CARDIAC CATHETERIZATION N/A 01/22/2015   Procedure: Coronary Stent Intervention;  Surgeon: Isaias Cowman, MD;  Location: [place] [facility];  Service: Cardiovascular;  Laterality: N/A;   CATARACT EXTRACTION, BILATERAL     COLONOSCOPY WITH PROPOFOL N/A 12/08/2015   Procedure: COLONOSCOPY WITH PROPOFOL;  Surgeon: Lollie Sails, MD;  Location: [facility]COPY;  Service: Endoscopy;  Laterality: N/A;  COLONOSCOPY WITH PROPOFOL N/A 12/09/2015   Procedure: COLONOSCOPY WITH PROPOFOL;  Surgeon: Lollie Sails, MD;  Location: [facility] ENDOSCOPY;  Service: Endoscopy;  Laterality: N/A;   CORONARY ANGIOPLASTY     5 stents   CORONARY STENT PLACEMENT  2015   x5   CYSTOSCOPY N/A 09/11/2018   Procedure: CYSTOSCOPY;  Surgeon: [name], MD;  Location: [facility];  Service: Urology;  Laterality: N/A;   CYSTOSCOPY WITH BIOPSY N/A 12/21/2017   Procedure: CYSTOSCOPY WITH BIOPSY;  Surgeon: [name], MD;  Location: [facility];  Service: Urology;  Laterality: N/A;   ESOPHAGOGASTRODUODENOSCOPY (EGD) WITH PROPOFOL N/A 12/08/2015   Procedure: ESOPHAGOGASTRODUODENOSCOPY (EGD) WITH PROPOFOL;  Surgeon: Lollie Sails, MD;  Location: [facility] ENDOSCOPY;  Service:  Endoscopy;  Laterality: N/A;   ESOPHAGOGASTRODUODENOSCOPY (EGD) WITH PROPOFOL N/A 12/13/2017   Procedure: ESOPHAGOGASTRODUODENOSCOPY (EGD) WITH PROPOFOL;  Surgeon: Lollie Sails, MD;  Location: [facility] ENDOSCOPY;  Service: Endoscopy;  Laterality: N/A;   EYE SURGERY     HERNIA REPAIR     kidney tumor remove     TRANSURETHRAL RESECTION OF BLADDER TUMOR WITH GYRUS (TURBT-GYRUS)  89/3734   UMBILICAL HERNIA REPAIR     urethral meatotomy     Patient Active Problem List   Diagnosis Date Noted   Class 2 obesity due to excess calories with body mass index (BMI) of 36.0 to 36.9 in adult 04/11/2020   Swelling of limb 03/28/2020   Lymphedema 03/28/2020   Trigeminal neuralgia 12/25/2019   Lower limb ulcer, calf, left, limited to breakdown of skin ([place]) 12/25/2019   OSA (obstructive sleep apnea) 28/76/8115   Acute systolic CHF (congestive heart failure) ([place]) 12/12/2018   Bruising 07/10/2018   Diet-controlled type 2 diabetes mellitus ([place]) 03/24/2018   Microalbuminuria 03/24/2018   Dizziness 11/17/2017   Simple chronic bronchitis ([place]) 09/22/2017   SOBOE (shortness of breath on exertion) 02/18/2017   Chronic midline low back pain without sciatica 12/29/2016   Periodic limb movement disorder 12/29/2016   Benign essential tremor 06/22/2016   Pure hypercholesterolemia 06/20/2015   Erectile dysfunction due to arterial insufficiency 06/16/2015   Unstable angina ([place]) 01/22/2015   Abdominal aortic 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 rupture ([place]) 12/31/2014   Benign prostatic hypertrophy without urinary obstruction 07/31/2014   Enlarged prostate 07/31/2014   Benign essential HTN 06/11/2014   Carotid artery narrowing 02/08/2014   Carotid artery obstruction 02/08/2014   Bilateral carotid artery stenosis 02/08/2014   Chest pain 08/20/2013   Bilateral cataracts 05/16/2013   Cataract 05/16/2013   Clinical depression 12/12/2012   Diabetes mellitus, type 2 ([place]) 12/12/2012   Combined fat and carbohydrate  induced hyperlipemia 12/12/2012   Major depressive disorder, single episode, unspecified 12/12/2012   Acid reflux 11/09/2012   Presence of stent in coronary artery 11/09/2012   BP (high blood pressure) 11/09/2012   Healed myocardial infarct 11/09/2012   Gastro-esophageal reflux disease without esophagitis 11/09/2012   History of cardiovascular surgery 11/09/2012   Fothergill's neuralgia 08/07/2012   Swelling of testicle 04/06/2012   Disorder of male genital organ 04/06/2012   Swelling of the testicles 04/06/2012   Arteriosclerosis of coronary artery 11/16/2011   CAD in native artery 11/16/2011   Fatigue 06/04/2011   Avitaminosis D 11/30/2010    REFERRING DIAG: Repeated falls   THERAPY DIAG:   Difficulty in walking, not elsewhere classified  Muscle weakness (generalized)  Other lack of coordination  Abnormality of gait and mobility  Other abnormalities of gait and mobility  Unsteadiness on feet  Rationale for Evaluation and  Treatment Rehabilitation   Subjective Assessment - 09/22/21 0936     Subjective Patient reports doing okay - states moving fairly well today   Patient is accompained by: Family member -Wife   Pertinent History Patient is a 77 year old male with referral for Physical Therapy with diagnosis of Parkinsons disease and history of falling. He reports he diagnosed last year with Parkinsons and has improved his tremors with use of medication but reports worsening shuffling and difficulty with mobility. Patient has past medical history signifiant for Parkinsons; Arthritis, bladder cancer, Trigemic Neuralgia. He lives with wife in 1 level home and current using cane with reported recent fall.    Limitations Walking;Lifting;Standing;House hold activities    How long can you sit comfortably? No limits    How long can you stand comfortably? 15 min- limited due to back pain and foot numbess    How long can you walk comfortably? about 80 feet    Patient Stated Goals  Improve my balance, be able to get up off the floor so I can maybe work in my garage again.    Currently in Pain? No/denies               PRECAUTIONS: Falls   PAIN:   No pain reported.     TODAY'S TREATMENT:   03/04/2022   There.ex: Instructed in self stretching and flexibility/bed mobility  Supine hamstring stretch- using strap - hold 30 sec x 4 each LE Supine trunk rotation- VC to keep shoulder flat- hold 30 sec x 4 each side Sidelye thoracic rotation- open/closed book- 2 sets of 10 reps (painfree as possible)   Bed mobility- supine to sit (rolling) - back to side - x 10 reps x 2 sets each side Supine to sit technique- using PT hand to simulate 1/2 bed rail he has at home- back and forth x 5 trials Discussion of moving 1/2 bed rail at home to decrease upward scooting currently required.        PATIENT EDUCATION: Education details: form/technique with exercise Person educated: Patient Education method: Explanation, Demonstration, Tactile cues, and Verbal cues Education comprehension: verbalized understanding, returned demonstration, verbal cues required, tactile cues required, and needs further education   HOME EXERCISE PROGRAM: Access Code: [email or web address] URL: [email or web address] Date: 03/04/2022 Prepared by: Sande Brothers  Exercises - Supine Hamstring Stretch with Strap  - 1 x daily - 7 x weekly - 3 sets - 20-30 sec hold - Supine Lower Trunk Rotation  - 1 x daily - 7 x weekly - 3 sets - 20-30 sec hold - Moving Body Across Bed  - 1 x daily - 2 x weekly - 2 sets - 10 reps - Sidelying Thoracic Rotation with Open Book  - 1 x daily - 7 x weekly - 3 sets - 10 reps     Access Code: [email or web address] URL: [email or web address] Date: 03/02/2022 Prepared by: Sande Brothers  Exercises - Sit to Stand Without Arm Support  - 1 x daily - 3 x weekly - 3 sets - 10 reps - Standing Thoracic Rotation with Dowel  - 1 x daily - 7 x weekly - 2 sets -  10 reps - Standing Hip Extension with Chair  - 1 x daily - 3 x weekly - 3 sets - 10 reps - Supine Bridge  - 1 x daily - 3 x weekly - 3 sets - 10 reps - Standing Heel Raises  - 1 x daily - 3 x weekly - 3 sets - 10  reps - Walking March  - 1 x daily - 7 x weekly - 5 sets - Sidestepping  - 1 x daily - 3 x weekly - 5 sets   PT Short Term Goals -       PT SHORT TERM GOAL #1   Title Pt will be independent with INITIAL  HEP in order to improve strength and balance in order to decrease fall risk and improve function at home and work.    Baseline 04/07/2021= No formal HEP in place. 05/11/2021= Patient reports doing pretty good- compliant with walking program and his stretching/strengthening.    Time 6    Period Weeks    Status Achieved    Target Date 05/19/21      PT SHORT TERM GOAL #2   Title Pt will decrease 5TSTS by at least 3 seconds in order to demonstrate clinically significant improvement in LE strength.    Baseline 04/07/2021= 25 sec; 05/11/2021= 18.65 sec    Time 6    Period Weeks    Status Achieved    Target Date 06/30/21              PT Long Term Goals -       PT LONG TERM GOAL #1   Title Pt will be independent with FINAL HEP in order to improve strength and balance in order to decrease fall risk and improve function at home and work.    Baseline 04/07/2021= No formal HEP in place. 07/01/2021=Patient reports performing walking and some standing LE strengthening exercises as instructed and states no questions at this time.  09/02/21: Pt reports he feels indep with exercises but is not performing HEP as much as he should; 11/17/2021= Patient reports compliant with current HEP- "I do them as best I can." 12/15/2021- Patient reports doing some exercises and walking but not feeling well in past week and denied performing HEP. Will keep goal active to ensure compliance and update HEP as appropriate.    Time 12    Period Weeks    Status Partially Met    Target Date 03/09/2022     PT LONG TERM  GOAL #2   Title Pt will improve FOTO to target score of 45% to display perceived improvements in ability to complete ADL's.    Baseline 2/7/22023= 41%; FOTO=53%    Time 12    Period Weeks    Status Achieved    Target Date 06/30/21      PT LONG TERM GOAL #3   Title Pt will decrease 5TSTS by at least 5 seconds in order to demonstrate clinically significant improvement in LE strength.    Baseline 04/07/2021= 25 sec; 05/11/2021= 18.65 sec without UE support; 4/10= 13.76 sec without UE support    Time 12    Period Weeks    Status Achieved    Target Date 06/30/21      PT LONG TERM GOAL #4   Title Pt will decrease TUG to below 17 seconds/decrease in order to demonstrate decreased fall risk.    Baseline 04/07/2021= 21 sec with SPC; 05/11/2021= 18.85 sec with use of cane; 07/21/2021=18.88 sec  avg with use of cane; 7/5: 22.85 sec with QC; 09/29/2021= 21.45 sec using upright walker; 11/17/2021= 20. 46 sec using upright walker; 10/17= 24 sec with upright walker; 02/16/2022= 19.85 sec and 18.95 sec = 19.4 sec using 4WW   Time 12    Period Weeks    Status Partially Met    Target Date 03/09/2022  PT LONG TERM GOAL #5   Title Pt will increase 10MWT by at least 0.15 m/s in order to demonstrate clinically significant improvement in community ambulation.    Baseline 04/07/2021= 0.56 m/s using SPC; 05/11/2021= 0.54 m/s using SPC; 07/01/2021= 0.6 m/s/ 7/5: 0.48 m/s without AD but with CGA; 09/29/2021= 0.64 m/s; 11/17/2021= 0.61 m/s using upright 4WW; 10/17= 0.59 m/s; 12/29/2021= 0.64 m/s; 12/19= 0.72 ms = will keep goal active to ensure consistency   Time 12    Period Weeks    Status Progressing    Target Date 03/09/2022     PT LONG TERM GOAL #6   Title Pt will increase 6MWT by at least 106m(1673f in order to demonstrate clinically significant improvement in cardiopulmonary endurance and community ambulation    Baseline 06/08/2021= 600 ft. in 5 min- Stopped due to fatigue using 4WW; 07/01/2021= 665 in 5 min 45 sec using  4WW; 7/5: 367 ft with QC; 09/29/2021= 740 feet using upright 4WW; 11/17/2021= 705 feet using upright 4WW; 12/15/2021= 680 feet with upright 4WW; 02/16/2022= 790 feet with use of upright 4WW- will keep goal active to ensure consistent.    Time 12   Period Weeks    Status Progressing   Target Date 03/09/2022              Plan - 08/20/21 [id number]   Clinical Impression Statement Patient presents with good motivation today and performed well with review of flexibility and bed mobility activities to maximize his independence at home. He required some VC for proper technique- issued another handout to supplement last visit therex HEP. He verbalized understanding of performing ROM activities daily and LE strengthening 3x/week.  Pt will continue to benefit from skilled PT services to progress dynamic strength/ balance to maximize functional capacity and reduced falls risk.     Personal Factors and Comorbidities Comorbidity 3+    Comorbidities arthritis, bladder cancer, Trigeminal Neuralgia    Examination-Activity Limitations Caring for Others;Carry;Continence;Lift;Squat;Stairs;Stand    Examination-Participation Restrictions Community Activity;Yard Work    [employer] officervolving/Moderate complexity    Rehab Potential Good    PT Frequency 2x / week    PT Duration 12 weeks    PT Treatment/Interventions ADLs/Self Care Home Management;Cryotherapy;Canalith Repostioning;Electrical Stimulation;Moist Heat;DME Instruction;Gait training;Stair training;Functional mobility training;Therapeutic activities;Therapeutic exercise;Balance training;Neuromuscular re-education;Patient/family education;Manual techniques;Passive range of motion;Dry needling;Vestibular    PT Next Visit Plan LE strength, improve step length with ambulation, muscle tissue lengthening    PT Home Exercise Plan Access Code: [email or web address] Physical Therapist- [place]01/05/24, 7:28 AM

## 2022-03-09 ENCOUNTER — Ambulatory Visit: Payer: Medicare HMO

## 2022-03-09 DIAGNOSIS — R262 Difficulty in walking, not elsewhere classified: Secondary | ICD-10-CM

## 2022-03-09 DIAGNOSIS — R269 Unspecified abnormalities of gait and mobility: Secondary | ICD-10-CM

## 2022-03-09 DIAGNOSIS — R2681 Unsteadiness on feet: Secondary | ICD-10-CM

## 2022-03-09 DIAGNOSIS — R2689 Other abnormalities of gait and mobility: Secondary | ICD-10-CM

## 2022-03-09 DIAGNOSIS — R278 Other lack of coordination: Secondary | ICD-10-CM

## 2022-03-09 DIAGNOSIS — M545 Low back pain, unspecified: Secondary | ICD-10-CM

## 2022-03-09 DIAGNOSIS — M6281 Muscle weakness (generalized): Secondary | ICD-10-CM

## 2022-03-09 NOTE — Therapy (Signed)
OUTPATIENT PHYSICAL THERAPY TREATMENT NOTE/PT DISCHARGE VISIT     Patient Name: [name] MRN: [id number] DOB:[birth year], 77 y.o., male Today's Date: 03/09/2022  PCP: [name]. [name], FNP REFERRING PROVIDER: Dr. Gurney Maxin   PT End of Session - 03/09/22 0937     Visit Number 22    Number of Visits 66    Date for PT Re-Evaluation 03/09/22    Authorization Type [employer]    Authorization Time Period 06/30/74-1/60/73; recert 71/07/2692- 10/03/4625    Progress Note Due on Visit 90    PT Start Time 0932    PT Stop Time 1015    PT Time Calculation (min) 43 min    Equipment Utilized During Treatment Gait belt    Activity Tolerance Patient tolerated treatment well;No increased pain    Behavior During Therapy [facility] for tasks assessed/performed                   Past Medical History:  Diagnosis Date   Anxiety    Arteriosclerosis of coronary artery 11/16/2011   Overview:  Stent 10/2011 stent rca 2015 with collaterals to lad which is chronically occluded    Benign enlargement of prostate    Benign essential HTN 06/11/2014   Benign prostatic hypertrophy without urinary obstruction 07/31/2014   Bilateral cataracts 05/16/2013   Overview:  Dr. [facility]     Bone spur of foot    Left   BP (high blood pressure) 11/09/2012   Cancer ([place])    skin (forehead) and bladder   Carotid artery narrowing 02/08/2014   Depression    Detrusor hypertrophy    Diabetes ([place])    Diabetes mellitus, type 2 ([place]) 12/12/2012   Diverticulosis    Dyspnea    Esophageal reflux    Esophageal reflux    Fothergill's neuralgia 08/07/2012   Overview:  [facility]    Gastritis    GERD (gastroesophageal reflux disease)    Headache    cluster headaches   Healed myocardial infarct 11/09/2012   Hearing loss in left ear    Heart disease    Hematuria    Hemorrhoids    History of hiatal hernia 12/14/2017   small    Hypercholesteremia    Lesion of bladder    Myocardial infarct (HCC)     Presence of stent in coronary artery 11/09/2012   Rectal bleeding    Trigeminal neuralgia    Trigeminal neuralgia    Valvular heart disease    Vitamin D deficiency    Past Surgical History:  Procedure Laterality Date   APPENDECTOMY     BOTOX INJECTION N/A 12/21/2017   Procedure: Bladder BOTOX INJECTION;  Surgeon: [name], MD;  Location: [facility] ORS;  Service: Urology;  Laterality: N/A;   BOTOX INJECTION N/A 09/11/2018   Procedure: Bladder BOTOX INJECTION;  Surgeon: [name], MD;  Location: [facility] ORS;  Service: Urology;  Laterality: N/A;   CARDIAC CATHETERIZATION     CARDIAC CATHETERIZATION N/A 01/22/2015   Procedure: Left Heart Cath;  Surgeon: Corey Skains, MD;  Location: [place] [facility];  Service: Cardiovascular;  Laterality: N/A;   CARDIAC CATHETERIZATION N/A 01/22/2015   Procedure: Coronary Stent Intervention;  Surgeon: Isaias Cowman, MD;  Location: [place] [facility];  Service: Cardiovascular;  Laterality: N/A;   CATARACT EXTRACTION, BILATERAL     COLONOSCOPY WITH PROPOFOL N/A 12/08/2015   Procedure: COLONOSCOPY WITH PROPOFOL;  Surgeon: Lollie Sails, MD;  Location: [facility] ENDOSCOPY;  Service: Endoscopy;  Laterality:  N/A;   COLONOSCOPY WITH PROPOFOL N/A 12/09/2015   Procedure: COLONOSCOPY WITH PROPOFOL;  Surgeon: Lollie Sails, MD;  Location: [facility] ENDOSCOPY;  Service: Endoscopy;  Laterality: N/A;   CORONARY ANGIOPLASTY     5 stents   CORONARY STENT PLACEMENT  2015   x5   CYSTOSCOPY N/A 09/11/2018   Procedure: CYSTOSCOPY;  Surgeon: [name], MD;  Location: [facility] ORS;  Service: Urology;  Laterality: N/A;   CYSTOSCOPY WITH BIOPSY N/A 12/21/2017   Procedure: CYSTOSCOPY WITH BIOPSY;  Surgeon: [name], MD;  Location: [facility] ORS;  Service: Urology;  Laterality: N/A;   ESOPHAGOGASTRODUODENOSCOPY (EGD) WITH PROPOFOL N/A 12/08/2015   Procedure: ESOPHAGOGASTRODUODENOSCOPY (EGD) WITH PROPOFOL;  Surgeon: Lollie Sails, MD;  Location: [facility]  ENDOSCOPY;  Service: Endoscopy;  Laterality: N/A;   ESOPHAGOGASTRODUODENOSCOPY (EGD) WITH PROPOFOL N/A 12/13/2017   Procedure: ESOPHAGOGASTRODUODENOSCOPY (EGD) WITH PROPOFOL;  Surgeon: Lollie Sails, MD;  Location: [facility] ENDOSCOPY;  Service: Endoscopy;  Laterality: N/A;   EYE SURGERY     HERNIA REPAIR     kidney tumor remove     TRANSURETHRAL RESECTION OF BLADDER TUMOR WITH GYRUS (TURBT-GYRUS)  16/1096   UMBILICAL HERNIA REPAIR     urethral meatotomy     Patient Active Problem List   Diagnosis Date Noted   Class 2 obesity due to excess calories with body mass index (BMI) of 36.0 to 36.9 in adult 04/11/2020   Swelling of limb 03/28/2020   Lymphedema 03/28/2020   Trigeminal neuralgia 12/25/2019   Lower limb ulcer, calf, left, limited to breakdown of skin ([place]) 12/25/2019   OSA (obstructive sleep apnea) 04/54/0981   Acute systolic CHF (congestive heart failure) ([place]) 12/12/2018   Bruising 07/10/2018   Diet-controlled type 2 diabetes mellitus ([place]) 03/24/2018   Microalbuminuria 03/24/2018   Dizziness 11/17/2017   Simple chronic bronchitis ([place]) 09/22/2017   SOBOE (shortness of breath on exertion) 02/18/2017   Chronic midline low back pain without sciatica 12/29/2016   Periodic limb movement disorder 12/29/2016   Benign essential tremor 06/22/2016   Pure hypercholesterolemia 06/20/2015   Erectile dysfunction due to arterial insufficiency 06/16/2015   Unstable angina ([place]) 01/22/2015   Abdominal aortic 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 rupture ([place]) 12/31/2014   Benign prostatic hypertrophy without urinary obstruction 07/31/2014   Enlarged prostate 07/31/2014   Benign essential HTN 06/11/2014   Carotid artery narrowing 02/08/2014   Carotid artery obstruction 02/08/2014   Bilateral carotid artery stenosis 02/08/2014   Chest pain 08/20/2013   Bilateral cataracts 05/16/2013   Cataract 05/16/2013   Clinical depression 12/12/2012   Diabetes mellitus, type 2 ([place]) 12/12/2012   Combined fat  and carbohydrate induced hyperlipemia 12/12/2012   Major depressive disorder, single episode, unspecified 12/12/2012   Acid reflux 11/09/2012   Presence of stent in coronary artery 11/09/2012   BP (high blood pressure) 11/09/2012   Healed myocardial infarct 11/09/2012   Gastro-esophageal reflux disease without esophagitis 11/09/2012   History of cardiovascular surgery 11/09/2012   Fothergill's neuralgia 08/07/2012   Swelling of testicle 04/06/2012   Disorder of male genital organ 04/06/2012   Swelling of the testicles 04/06/2012   Arteriosclerosis of coronary artery 11/16/2011   CAD in native artery 11/16/2011   Fatigue 06/04/2011   Avitaminosis D 11/30/2010    REFERRING DIAG: Repeated falls   THERAPY DIAG:   Difficulty in walking, not elsewhere classified  Muscle weakness (generalized)  Other lack of coordination  Abnormality of gait and mobility  Other abnormalities of gait and mobility  Unsteadiness on feet  Chronic  bilateral low back pain without sciatica  Rationale for Evaluation and Treatment Rehabilitation   Subjective Assessment - 09/22/21 0936     Subjective Patient reports doing really well- walking in home without a device and walked almost a mile with my rollator at [facility] yesterday.    Patient is accompained by: Family member -Wife   Pertinent History Patient is a 77 year old male with referral for Physical Therapy with diagnosis of Parkinsons disease and history of falling. He reports he diagnosed last year with Parkinsons and has improved his tremors with use of medication but reports worsening shuffling and difficulty with mobility. Patient has past medical history signifiant for Parkinsons; Arthritis, bladder cancer, Trigemic Neuralgia. He lives with wife in 1 level home and current using cane with reported recent fall.    Limitations Walking;Lifting;Standing;House hold activities    How long can you sit comfortably? No limits    How long can you stand  comfortably? 15 min- limited due to back pain and foot numbess    How long can you walk comfortably? about 80 feet    Patient Stated Goals Improve my balance, be able to get up off the floor so I can maybe work in my garage again.    Currently in Pain? No/denies               PRECAUTIONS: Falls   PAIN:   No pain reported.     TODAY'S TREATMENT:   03/09/2022   There.ex:   Interval Nustep L1-L5 LE only x 5 min  NMR:    REVIEWED FOR HEP and instructed to perform 2x/week.  Forward/retro walking in // bars x 10 (counting steps- attempting to make in 10 steps or less)   High knee marching in // bars x 8 (VC for height)   Side step in // bars- down and back x 5  Toe walking in // bars - down and back x 5  Tandem standing - hold 30 sec x 3 each LE  SLS- attempting to hold 5 sec- multiple attempts (instructed patient to perform at kitchen counter area at home)   Reassessed remaining goals for discharge visit.       PATIENT EDUCATION: Education details: form/technique with exercise Person educated: Patient Education method: Explanation, Demonstration, Tactile cues, and Verbal cues Education comprehension: verbalized understanding, returned demonstration, verbal cues required, tactile cues required, and needs further education   HOME EXERCISE PROGRAM: Access Code: [email or web address] URL: [email or web address] Date: 03/04/2022 Prepared by: Sande Brothers  Exercises - Supine Hamstring Stretch with Strap  - 1 x daily - 7 x weekly - 3 sets - 20-30 sec hold - Supine Lower Trunk Rotation  - 1 x daily - 7 x weekly - 3 sets - 20-30 sec hold - Moving Body Across Bed  - 1 x daily - 2 x weekly - 2 sets - 10 reps - Sidelying Thoracic Rotation with Open Book  - 1 x daily - 7 x weekly - 3 sets - 10 reps     Access Code: [email or web address] URL: [email or web address] Date: 03/02/2022 Prepared by: Sande Brothers  Exercises - Sit to Stand Without Arm  Support  - 1 x daily - 3 x weekly - 3 sets - 10 reps - Standing Thoracic Rotation with Dowel  - 1 x daily - 7 x weekly - 2 sets - 10 reps - Standing Hip Extension with Chair  - 1 x daily - 3 x weekly - 3 sets - 10 reps - Supine Bridge  -  1 x daily - 3 x weekly - 3 sets - 10 reps - Standing Heel Raises  - 1 x daily - 3 x weekly - 3 sets - 10 reps - Walking March  - 1 x daily - 7 x weekly - 5 sets - Sidestepping  - 1 x daily - 3 x weekly - 5 sets   PT Short Term Goals -       PT SHORT TERM GOAL #1   Title Pt will be independent with INITIAL  HEP in order to improve strength and balance in order to decrease fall risk and improve function at home and work.    Baseline 04/07/2021= No formal HEP in place. 05/11/2021= Patient reports doing pretty good- compliant with walking program and his stretching/strengthening.    Time 6    Period Weeks    Status Achieved    Target Date 05/19/21      PT SHORT TERM GOAL #2   Title Pt will decrease 5TSTS by at least 3 seconds in order to demonstrate clinically significant improvement in LE strength.    Baseline 04/07/2021= 25 sec; 05/11/2021= 18.65 sec    Time 6    Period Weeks    Status Achieved    Target Date 06/30/21              PT Long Term Goals -       PT LONG TERM GOAL #1   Title Pt will be independent with FINAL HEP in order to improve strength and balance in order to decrease fall risk and improve function at home and work.    Baseline 04/07/2021= No formal HEP in place. 07/01/2021=Patient reports performing walking and some standing LE strengthening exercises as instructed and states no questions at this time.  09/02/21: Pt reports he feels indep with exercises but is not performing HEP as much as he should; 11/17/2021= Patient reports compliant with current HEP- "I do them as best I can." 12/15/2021- Patient reports doing some exercises and walking but not feeling well in past week and denied performing HEP. Will keep goal active to ensure compliance  and update HEP as appropriate. 03/09/2022- Verbally reviewed LE strengthening, balance, mobility, and flexibility exercises and patient has handouts and knowledge of all HEP including frequency and duration of all activities.    Time 12    Period Weeks    Status Met    Target Date 03/09/2022     PT LONG TERM GOAL #2   Title Pt will improve FOTO to target score of 45% to display perceived improvements in ability to complete ADL's.    Baseline 2/7/22023= 41%; FOTO=53%    Time 12    Period Weeks    Status Achieved    Target Date 06/30/21      PT LONG TERM GOAL #3   Title Pt will decrease 5TSTS by at least 5 seconds in order to demonstrate clinically significant improvement in LE strength.    Baseline 04/07/2021= 25 sec; 05/11/2021= 18.65 sec without UE support; 4/10= 13.76 sec without UE support    Time 12    Period Weeks    Status Achieved    Target Date 06/30/21      PT LONG TERM GOAL #4   Title Pt will decrease TUG to below 17 seconds/decrease in order to demonstrate decreased fall risk.    Baseline 04/07/2021= 21 sec with SPC; 05/11/2021= 18.85 sec with use of cane; 07/21/2021=18.88 sec  avg with use of cane; 7/5:  22.85 sec with QC; 09/29/2021= 21.45 sec using upright walker; 11/17/2021= 20. 46 sec using upright walker; 10/17= 24 sec with upright walker; 02/16/2022= 19.85 sec and 18.95 sec = 19.4 sec using 4WW; 03/09/2022= 16.45 sec    Time 12    Period Weeks    Status Met    Target Date 03/09/2022     PT LONG TERM GOAL #5   Title Pt will increase 10MWT by at least 0.15 m/s in order to demonstrate clinically significant improvement in community ambulation.    Baseline 04/07/2021= 0.56 m/s using SPC; 05/11/2021= 0.54 m/s using SPC; 07/01/2021= 0.6 m/s/ 7/5: 0.48 m/s without AD but with CGA; 09/29/2021= 0.64 m/s; 11/17/2021= 0.61 m/s using upright 4WW; 10/17= 0.59 m/s; 12/29/2021= 0.64 m/s; 12/19= 0.72 ms = will keep goal active to ensure consistency 03/09/2022= 0.75 m/s   Time 12    Period Weeks    Status  GOAL MET    Target Date 03/09/2022     PT LONG TERM GOAL #6   Title Pt will increase 6MWT by at least 53m(1680f in order to demonstrate clinically significant improvement in cardiopulmonary endurance and community ambulation    Baseline 06/08/2021= 600 ft. in 5 min- Stopped due to fatigue using 4WW; 07/01/2021= 665 in 5 min 45 sec using 4WW; 7/5: 367 ft with QC; 09/29/2021= 740 feet using upright 4WW; 11/17/2021= 705 feet using upright 4WW; 12/15/2021= 680 feet with upright 4WW; 02/16/2022= 790 feet with use of upright 4WW- will keep goal active to ensure consistent. 03/04/2022= 780 feet with upright walker.    Time 12   Period Weeks    Status GOAL MET   Target Date 03/09/2022              Plan - 08/20/21 [id number]   Clinical Impression Statement Patient presents with good motivation for discharge visit today. He has progressed steadily throughout his episode of care. He is demonstrating improved and safer gait quality and speed with less overall shuffling and no report of any recent falls. He has improved with overall gait endurance and good working of HEP to maintain his progress. He may benefit from PT in future if his condition worsened but appropriate for discharge meeting all goals at this time.     Personal Factors and Comorbidities Comorbidity 3+    Comorbidities arthritis, bladder cancer, Trigeminal Neuralgia    Examination-Activity Limitations Caring for Others;Carry;Continence;Lift;Squat;Stairs;Stand    Examination-Participation Restrictions Community Activity;Yard Work    [employer] officervolving/Moderate complexity    Rehab Potential Good    PT Frequency 2x / week    PT Duration 12 weeks    PT Treatment/Interventions ADLs/Self Care Home Management;Cryotherapy;Canalith Repostioning;Electrical Stimulation;Moist Heat;DME Instruction;Gait training;Stair training;Functional mobility training;Therapeutic activities;Therapeutic exercise;Balance training;Neuromuscular  re-education;Patient/family education;Manual techniques;Passive range of motion;Dry needling;Vestibular    PT Next Visit Plan LE strength, improve step length with ambulation, muscle tissue lengthening    PT Home Exercise Plan Access Code: [email or web address] Physical Therapist- [facility]01/09/24, 11:00 AM

## 2022-03-11 ENCOUNTER — Ambulatory Visit: Payer: Medicare HMO

## 2022-03-16 ENCOUNTER — Ambulatory Visit: Payer: Medicare HMO

## 2022-03-18 ENCOUNTER — Ambulatory Visit: Payer: Medicare HMO

## 2022-03-18 DIAGNOSIS — J9601 Acute respiratory failure with hypoxia: Secondary | ICD-10-CM | POA: Insufficient documentation

## 2022-03-18 DIAGNOSIS — N3281 Overactive bladder: Secondary | ICD-10-CM | POA: Insufficient documentation

## 2022-03-18 DIAGNOSIS — S2231XK Fracture of one rib, right side, subsequent encounter for fracture with nonunion: Secondary | ICD-10-CM | POA: Insufficient documentation

## 2022-03-18 DIAGNOSIS — W19XXXA Unspecified fall, initial encounter: Secondary | ICD-10-CM | POA: Insufficient documentation

## 2022-03-23 ENCOUNTER — Ambulatory Visit: Payer: Medicare HMO

## 2022-03-25 ENCOUNTER — Ambulatory Visit: Payer: Medicare HMO

## 2022-03-30 ENCOUNTER — Ambulatory Visit: Payer: Medicare HMO

## 2022-04-01 ENCOUNTER — Ambulatory Visit: Payer: Medicare HMO

## 2022-04-06 ENCOUNTER — Ambulatory Visit: Payer: Medicare HMO

## 2022-04-08 ENCOUNTER — Ambulatory Visit: Payer: Medicare HMO

## 2022-04-13 ENCOUNTER — Ambulatory Visit: Payer: Medicare HMO

## 2022-04-14 ENCOUNTER — Encounter: Payer: Medicare HMO | Attending: Internal Medicine | Admitting: Internal Medicine

## 2022-04-14 DIAGNOSIS — I872 Venous insufficiency (chronic) (peripheral): Secondary | ICD-10-CM | POA: Insufficient documentation

## 2022-04-14 DIAGNOSIS — I10 Essential (primary) hypertension: Secondary | ICD-10-CM | POA: Insufficient documentation

## 2022-04-14 DIAGNOSIS — I2510 Atherosclerotic heart disease of native coronary artery without angina pectoris: Secondary | ICD-10-CM | POA: Insufficient documentation

## 2022-04-14 DIAGNOSIS — I252 Old myocardial infarction: Secondary | ICD-10-CM | POA: Diagnosis not present

## 2022-04-14 DIAGNOSIS — Z87891 Personal history of nicotine dependence: Secondary | ICD-10-CM | POA: Insufficient documentation

## 2022-04-14 DIAGNOSIS — I87311 Chronic venous hypertension (idiopathic) with ulcer of right lower extremity: Secondary | ICD-10-CM | POA: Diagnosis not present

## 2022-04-14 DIAGNOSIS — G4733 Obstructive sleep apnea (adult) (pediatric): Secondary | ICD-10-CM | POA: Insufficient documentation

## 2022-04-14 DIAGNOSIS — L97812 Non-pressure chronic ulcer of other part of right lower leg with fat layer exposed: Secondary | ICD-10-CM | POA: Diagnosis not present

## 2022-04-14 DIAGNOSIS — E11622 Type 2 diabetes mellitus with other skin ulcer: Secondary | ICD-10-CM | POA: Diagnosis not present

## 2022-04-15 ENCOUNTER — Ambulatory Visit: Payer: Medicare HMO

## 2022-04-17 NOTE — Progress Notes (Signed)
CHANDEN, STEAGALL A ([id number]) [id number] Page 1 of 5 Visit Report for 04/14/2022 Abuse Risk Screen Details Patient Name: Date of Service: [name], [name] MES A. 04/14/2022 8:45 A M Medical Record Number: [id number] Patient Account Number: [id number] Date of Birth/Sex: Treating RN: 1945-10-06 (77 y.o. [name]) Carlene Coria Primary Care Krystyne Tewksbury: Serina Cowper, [facility] Other Clinician: Referring Ashawna Hanback: Treating Chalice Philbert/Extender: Rosiland Oz in Treatment: 0 Abuse Risk Screen Items Answer ABUSE RISK SCREEN: Has anyone close to you tried to hurt or harm you recentlyo No Do you feel uncomfortable with anyone in your familyo No Has anyone forced you do things that you didnt want to doo No Electronic Signature(s) Signed: 04/16/2022 2:19:49 PM By: Carlene Coria RN Entered By: Carlene Coria on 04/14/2022 08:55:43 -------------------------------------------------------------------------------- Activities of Daily Living Details Patient Name: Date of Service: [name], BERTEAU MES A. 04/14/2022 8:45 A M Medical Record Number: [id number] Patient Account Number: [id number] Date of Birth/Sex: Treating RN: [birth year] (77 y.o. [name]) Carlene Coria Primary Care Makaylen Thieme: Serina Cowper, [facility] Other Clinician: Referring Dewell Monnier: Treating Albertus Chiarelli/Extender: Rosiland Oz in Treatment: 0 Activities of Daily Living Items Answer Activities of Daily Living (Please select one for each item) Drive Automobile Not Able T Medications ake Completely Able Use T elephone Completely Able Care for Appearance Completely Able Use T oilet Completely Able Bath / Shower Completely Able Dress Self Completely Able Feed Self Completely Able Walk Completely Able Get In / Out Bed Completely Able Housework Completely OSUALDO, [name] A ([id number]) [phone] [id number] Page 2 of 5 Prepare Meals Completely Able Handle Money Completely Able Shop for  Self Completely Able Electronic Signature(s) Signed: 04/16/2022 2:19:49 PM By: Carlene Coria RN Entered By: Carlene Coria on 04/14/2022 08:56:10 -------------------------------------------------------------------------------- Education Screening Details Patient Name: Date of Service: [name] MES A. 04/14/2022 8:45 A M Medical Record Number: [id number] Patient Account Number: [id number] Date of Birth/Sex: Treating RN: 14-Dec-1945 (77 y.o. [name]) Carlene Coria Primary Care Ritesh Opara: Serina Cowper, [facility] Other Clinician: Referring Tavon Magnussen: Treating Khaiden Segreto/Extender: Rosiland Oz in Treatment: 0 Primary Learner Assessed: Patient Learning Preferences/Education Level/Primary Language Learning Preference: Explanation Highest Education Level: High School Preferred Language: English Cognitive Barrier Language Barrier: No Translator Needed: No Memory Deficit: No Emotional Barrier: No Cultural/Religious Beliefs Affecting Medical Care: No Physical Barrier Impaired Vision: No Impaired Hearing: No Decreased Hand dexterity: No Knowledge/Comprehension Knowledge Level: Medium Comprehension Level: Medium Ability to understand written instructions: Medium Ability to understand verbal instructions: Medium Motivation Anxiety Level: Anxious Cooperation: Cooperative Education Importance: Acknowledges Need Interest in Health Problems: Asks Questions Perception: Coherent Willingness to Engage in Self-Management High Activities: Readiness to Engage in Self-Management High Activities: Electronic Signature(s) Signed: 04/16/2022 2:19:49 PM By: Carlene Coria RN Entered By: Carlene Coria on 04/14/2022 08:56:53 FOX, TILLMON ([id number]) [phone] [id number] Page 3 of 5 -------------------------------------------------------------------------------- Fall Risk Assessment Details Patient Name: Date of Service: RANIER, [name] MES A. 04/14/2022 8:45 A M Medical Record  Number: [id number] Patient Account Number: [id number] Date of Birth/Sex: Treating RN: 1945/11/24 (77 y.o. [name]) Carlene Coria Primary Care Capitola Ladson: Serina Cowper, [facility] Other Clinician: Referring Taishaun Levels: Treating Orvell Careaga/Extender: Rosiland Oz in Treatment: 0 Fall Risk Assessment Items Have you had 2 or more falls in the last 12 monthso 0 Yes Have you had any fall that resulted in injury in the last 12 monthso 0 Yes FALLS RISK SCREEN History of falling - immediate or within 3 months 25 Yes Secondary diagnosis (Do you have 2 or more medical diagnoseso) 15 Yes Ambulatory  aid None/bed rest/wheelchair/nurse 0 No Crutches/cane/walker 15 Yes Furniture 0 No Intravenous therapy Access/Saline/Heparin Lock 0 No Gait/Transferring Normal/ bed rest/ wheelchair 0 Yes Weak (short steps with or without shuffle, stooped but able to lift head while walking, may seek 0 No support from furniture) Impaired (short steps with shuffle, may have difficulty arising from chair, head down, impaired 0 No balance) Mental Status Oriented to own ability 0 Yes Electronic Signature(s) Signed: 04/16/2022 2:19:49 PM By: Carlene Coria RN Entered By: Carlene Coria on 04/14/2022 08:57:22 -------------------------------------------------------------------------------- Foot Assessment Details Patient Name: Date of Service: [name] MES A. 04/14/2022 8:45 A M Medical Record Number: [id number] Patient Account Number: [id number] Date of Birth/Sex: Treating RN: 1945-03-13 (77 y.o. [name]) Carlene Coria Primary Care Lorali Khamis: Serina Cowper, [facility] Other Clinician: Referring Grae Leathers: Treating Frenchie Pribyl/Extender: Rosiland Oz in Treatment: 0 Foot Assessment Items Site Locations [place], [place] A ([id number]) [id number] Page 4 of 5 + = Sensation present, - = Sensation absent, C = Callus, U = Ulcer R = Redness, W = Warmth, M = Maceration, PU = Pre-ulcerative  lesion F = Fissure, S = Swelling, D = Dryness Assessment Right: Left: Other Deformity: No No Prior Foot Ulcer: No No Prior Amputation: No No Charcot Joint: No No Ambulatory Status: Ambulatory Without Help Gait: Steady Electronic Signature(s) Signed: 04/16/2022 2:19:49 PM By: Carlene Coria RN Entered By: Carlene Coria on 04/14/2022 09:00:40 -------------------------------------------------------------------------------- Nutrition Risk Screening Details Patient Name: Date of Service: [name], [name] MES A. 04/14/2022 8:45 A M Medical Record Number: [id number] Patient Account Number: [id number] Date of Birth/Sex: Treating RN: 04-06-1945 (77 y.o. [name]) Carlene Coria Primary Care Mcarthur Ivins: Serina Cowper, [facility] Other Clinician: Referring Drako Maese: Treating Nakeita Styles/Extender: Rosiland Oz in Treatment: 0 Height (in): 67 Weight (lbs): 224 Body Mass Index (BMI): 35.1 Nutrition Risk Screening Items Score Screening NUTRITION RISK SCREEN: I have an illness or condition that made me change the kind and/or amount of food I eat 2 Yes I eat fewer than two meals per day 0 No I eat few fruits and vegetables, or milk products 0 No I have three or more drinks of beer, liquor or wine almost every day 0 No I have tooth or mouth problems that make it hard for me to eat 0 No I don't always have enough money to buy the food I need 0 No DUNG, COULBOURN A ([id number]) [phone] [id number] Page 5 of 5 I eat alone most of the time 0 No I take three or more different prescribed or over-the-counter drugs a day 1 Yes Without wanting to, I have lost or gained 10 pounds in the last six months 0 No I am not always physically able to shop, cook and/or feed myself 0 No Nutrition Protocols Good Risk Protocol Moderate Risk Protocol 0 Provide education on nutrition High Risk Proctocol Risk Level: Moderate Risk Score: 3 Electronic Signature(s) Signed: 04/16/2022 2:19:49 PM By: Carlene Coria RN Entered By: Carlene Coria on 04/14/2022 08:57:39
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
## 2022-04-20 ENCOUNTER — Ambulatory Visit: Payer: Medicare HMO

## 2022-04-21 ENCOUNTER — Encounter (HOSPITAL_BASED_OUTPATIENT_CLINIC_OR_DEPARTMENT_OTHER): Payer: Medicare HMO | Admitting: Internal Medicine

## 2022-04-21 DIAGNOSIS — I87311 Chronic venous hypertension (idiopathic) with ulcer of right lower extremity: Secondary | ICD-10-CM

## 2022-04-21 DIAGNOSIS — L97812 Non-pressure chronic ulcer of other part of right lower leg with fat layer exposed: Secondary | ICD-10-CM | POA: Diagnosis not present

## 2022-04-21 DIAGNOSIS — E11622 Type 2 diabetes mellitus with other skin ulcer: Secondary | ICD-10-CM | POA: Diagnosis not present

## 2022-04-22 ENCOUNTER — Ambulatory Visit: Payer: Medicare HMO

## 2022-04-22 NOTE — Progress Notes (Signed)
NOVAH, TORRUELLA ([id number]) [id number] Page 1 of 7 Visit Report for 04/14/2022 Chief Complaint Document Details Patient Name: Date of Service: [name], [name]. 04/14/2022 8:45 [name] Medical Record Number: [id number] Patient Account Number: [id number] Date of Birth/Sex: Treating RN: [birth year] (76 y.o. [name]) Carlene Coria Primary Care Provider: Serina [name], [name] Other Clinician: Referring Provider: Treating Provider/Extender: Wendall Papa, [name] Weeks in Treatment: 0 Information Obtained from: Patient Chief Complaint 04/14/2022; right lower extremity wound Electronic Signature(s) Signed: 04/14/2022 12:00:48 PM By: Kalman Shan DO Entered By: Kalman Shan on 04/14/2022 09:26:58 -------------------------------------------------------------------------------- HPI Details Patient Name: Date of Service: [name]. 04/14/2022 8:45 [name] Medical Record Number: [id number] Patient Account Number: [id number] Date of Birth/Sex: Treating RN: 28-Jun-1945 (76 y.o. [name]) Carlene Coria Primary Care Provider: Serina [name], [name] Other Clinician: Referring Provider: Treating Provider/Extender: Wendall Papa, [name] Weeks in Treatment: 0 History of Present Illness HPI Description: 04/14/2022 [name] is [name] with [name] past medical history of diet-controlled type 2 diabetes, OSA, venous insufficiency and CAD that presents to the clinic for [name] to the right lower extremity. He states the wound started spontaneously and he has been using Neosporin to the area. He wears compression stockings daily. He currently denies signs of infection. He denies pain to the site. The wound has remained stable over the past 6 months. ABIs in office were 1.08 on the right. Electronic Signature(s) Signed: 04/14/2022 12:00:48 PM By: Kalman Shan DO Entered By: Kalman Shan on 04/14/2022 09:30:10 [name], [name]  ([id number]) [id number] Page 2 of 7 -------------------------------------------------------------------------------- Physical Exam Details Patient Name: Date of Service: [name], [name]. 04/14/2022 8:45 [name] Medical Record Number: [id number] Patient Account Number: [id number] Date of Birth/Sex: Treating RN: 29-Apr-1945 (76 y.o. [name]) Carlene Coria Primary Care Provider: Serina [name], [name] Other Clinician: Referring Provider: Treating Provider/Extender: [name], [name] Weeks in Treatment: 0 Constitutional . Cardiovascular . Psychiatric . Notes Right lower extremity: 2+ pitting edema to the knee. Circular open wound to the anterior aspect with nonviable tissue and granulation tissue. No signs of surrounding infection including increased warmth, erythema or purulent drainage. Venous stasis dermatitis noted throughout the leg. Varicose veins around the ankle. Electronic Signature(s) Signed: 04/14/2022 12:00:48 PM By: Kalman Shan DO Entered By: Kalman Shan on 04/14/2022 09:30:59 -------------------------------------------------------------------------------- Physician Orders Details Patient Name: Date of Service: [name]. 04/14/2022 8:45 [name] Medical Record Number: [id number] Patient Account Number: [id number] Date of Birth/Sex: Treating RN: Nov 25, 1945 (76 y.o. [name]) Carlene Coria Primary Care Provider: Serina [name], [name] Other Clinician: Referring Provider: Treating Provider/Extender: Wendall Papa, [name] Weeks in Treatment: 0 Verbal / Phone Orders: No Diagnosis Coding ICD-10 Coding Code Description [phone] Non-pressure chronic ulcer of other part of right lower leg with fat layer exposed I87.311 Chronic venous hypertension (idiopathic) with ulcer of right lower extremity E11.622 Type 2 diabetes mellitus with other skin ulcer I89.0 Lymphedema, not elsewhere classified G47.33 Obstructive sleep apnea (adult)  (pediatric) [name], [name] ([id number]) [id number] Page 3 of 7 Follow-up Appointments Return Appointment in 1 week. Bathing/ Shower/ Hygiene May shower with wound dressing protected with water repellent cover or cast protector. Anesthetic (Use 'Patient Medications' Section for Anesthetic Order Entry) Lidocaine applied to wound bed Edema Control - Lymphedema / Segmental Compressive Device / Other Optional: One layer of unna paste to top of compression wrap (to act as an anchor). Elevate, Exercise Daily and [name] void Standing for  Long Periods of Time. Elevate legs to the level of the heart and pump ankles as often as possible Elevate leg(s) parallel to the floor when sitting. Medications-Please add to medication list. ntibiotic - gentamycin Topical [name] Wound Treatment Wound #1 - Lower Leg Wound Laterality: Right, Medial Cleanser: Soap and Water 1 x Per Week/30 Days Discharge Instructions: Gently cleanse wound with antibacterial soap, rinse and pat dry prior to dressing wounds Cleanser: Wound Cleanser 1 x Per Week/30 Days Discharge Instructions: Wash your hands with soap and water. Remove old dressing, discard into plastic bag and place into trash. Cleanse the wound with Wound Cleanser prior to applying [name] clean dressing using gauze sponges, not tissues or cotton balls. Do not scrub or use excessive force. Pat dry using gauze sponges, not tissue or cotton balls. Topical: Gentamicin 1 x Per Week/30 Days Discharge Instructions: Apply as directed by provider. Topical: Mupirocin Ointment 1 x Per Week/30 Days Discharge Instructions: Apply as directed by provider. Prim Dressing: Hydrofera Blue Ready Transfer Foam, 2.5x2.5 (in/in) 1 x Per Week/30 Days ary Discharge Instructions: Apply Hydrofera Blue Ready to wound bed as directed Secondary Dressing: ABD Pad 5x9 (in/in) 1 x Per Week/30 Days Discharge Instructions: Cover with ABD pad Compression Wrap: 3-LAYER WRAP - Profore  Lite LF 3 Multilayer Compression Bandaging System 1 x Per Week/30 Days Discharge Instructions: Apply 3 multi-layer wrap as prescribed. Patient Medications llergies: Lyrica, ACE Inhibitors, carbamazepine [name] Notifications Medication Indication Start End 04/14/2022 mupirocin DOSE topical 2 % ointment - ointment topical 04/14/2022 gentamicin DOSE topical 0.1 % ointment - ointment topical Electronic Signature(s) Signed: 04/16/2022 2:19:49 PM By: Carlene Coria RN Signed: 04/21/2022 11:08:41 AM By: Kalman Shan DO Previous Signature: 04/14/2022 12:00:48 PM Version By: Kalman Shan DO Entered By: Carlene Coria on 04/15/2022 16:26:46 Problem List Details -------------------------------------------------------------------------------- [name] ([id number]) [id number] Page 4 of 7 Patient Name: Date of Service: [name], [name]. 04/14/2022 8:45 [name] Medical Record Number: [id number] Patient Account Number: [id number] Date of Birth/Sex: Treating RN: 30-Aug-1945 (76 y.o. [name]) Carlene Coria Primary Care Provider: Serina [name], [facility] [name] Other Clinician: Referring Provider: Treating Provider/Extender: Wendall Papa, [name] Weeks in Treatment: 0 Active Problems ICD-10 Encounter Code Description Active Date MDM Diagnosis L97.812 Non-pressure chronic ulcer of other part of right lower leg with fat layer 04/14/2022 No Yes exposed I87.311 Chronic venous hypertension (idiopathic) with ulcer of right lower extremity 04/14/2022 No Yes E11.622 Type 2 diabetes mellitus with other skin ulcer 04/14/2022 No Yes G47.33 Obstructive sleep apnea (adult) (pediatric) 04/14/2022 No Yes Inactive Problems Resolved Problems Electronic Signature(s) Signed: 04/14/2022 12:00:48 PM By: Kalman Shan DO Entered By: Kalman Shan on 04/14/2022 09:25:40 -------------------------------------------------------------------------------- Progress Note Details Patient Name: Date of  Service: [name]. 04/14/2022 8:45 [name] Medical Record Number: [id number] Patient Account Number: [id number] Date of Birth/Sex: Treating RN: 1945-05-03 (76 y.o. [name]) Carlene Coria Primary Care Provider: Serina [name], [facility] [name] [name] Other Clinician: Referring Provider: Treating Provider/Extender: Wendall Papa, [name] Weeks in Treatment: 0 Subjective Chief Complaint Information obtained from Patient 04/14/2022; right lower extremity wound History of Present Illness (HPI) 04/14/2022 Mr. Dawuan Bendolph is [name] with [name] past medical history of diet-controlled type 2 diabetes, OSA, venous insufficiency and CAD that presents to the clinic for [name] to the right lower extremity. He states the wound started spontaneously and he has been using Neosporin to the area. He wears compression stockings daily. He currently denies signs of infection. He denies pain  to the site. The wound has remained stable over the past 6 months. ABIs in office were 1.08 on the right. [name], [name] ([id number]) [id number] Page 5 of 7 Patient History Information obtained from Patient. Allergies Lyrica, ACE Inhibitors, carbamazepine Social History Former smoker, Marital Status - Married, Alcohol Use - Never, Drug Use - No History, Caffeine Use - Never. Medical History Cardiovascular Patient has history of Coronary Artery Disease, Hypertension, Myocardial Infarction Endocrine Patient has history of Type II Diabetes Review of Systems (ROS) Integumentary (Skin) Complains or has symptoms of Wounds, Swelling. Objective Constitutional Vitals Time Taken: 8:51 AM, Height: 67 in, Source: Stated, Weight: 224 lbs, Source: Stated, BMI: 35.1, Temperature: 98.2 F, Pulse: 67 bpm, Respiratory Rate: 18 breaths/min, Blood Pressure: 102/63 mmHg. General Notes: Right lower extremity: 2+ pitting edema to the knee. Circular open wound to the anterior aspect with  nonviable tissue and granulation tissue. No signs of surrounding infection including increased warmth, erythema or purulent drainage. Venous stasis dermatitis noted throughout the leg. Varicose veins around the ankle. Integumentary (Hair, Skin) Wound #1 status is Open. Original cause of wound was Gradually Appeared. The date acquired was: 09/29/2021. The wound is located on the Right,Medial Lower Leg. The wound measures 1cm length x 1cm width x 0.1cm depth; 0.785cm^2 area and 0.079cm^3 volume. There is Fat Layer (Subcutaneous Tissue) exposed. There is no tunneling or undermining noted. There is [name] medium amount of serosanguineous drainage noted. There is medium (34-66%) red, pink granulation within the wound bed. There is [name] medium (34-66%) amount of necrotic tissue within the wound bed including Adherent Slough. Assessment Active Problems ICD-10 Non-pressure chronic ulcer of other part of right lower leg with fat layer exposed Chronic venous hypertension (idiopathic) with ulcer of right lower extremity Type 2 diabetes mellitus with other skin ulcer Obstructive sleep apnea (adult) (pediatric) Patient presents with [name] 75-monthhistory of nonhealing ulcer that occurred spontaneously secondary to venous insufficiency and complicated by type 2 diabetes. We discussed the importance of edema control for wound healing. ABIs were normal and should have adequate blood flow for healing. At this time [name] recommended [name] 3 layer compression. He knows to not get this wet or keep this on for more than 7 days. He has [name] cast protector bag that he can use to shower. Under the wrap [name] recommended antibiotic ointment to address any bioburden and Hydrofera Blue. Follow-up in 1 week. He knows to call with any questions or concerns. Plan Follow-up Appointments: Return Appointment in 1 week. Bathing/ Shower/ Hygiene: May shower with wound dressing protected with water repellent cover or cast protector. Anesthetic (Use  'Patient Medications' Section for Anesthetic Order Entry): Lidocaine applied to wound bed Edema Control - Lymphedema / Segmental Compressive Device / Other: Optional: One layer of unna paste to top of compression wrap (to act as an anchor). Elevate, Exercise Daily and Avoid Standing for Long Periods of Time. Elevate legs to the level of the heart and pump ankles as often as possible Elevate leg(s) parallel to the floor when sitting. WOUND #1: - Lower Leg Wound Laterality: Right, Medial Cleanser: Soap and Water 1 x Per Week/30 Days Discharge Instructions: Gently cleanse wound with antibacterial soap, rinse and pat dry prior to dressing wounds Cleanser: Wound Cleanser 1 x Per Week/30 Days [name], [name] ([id number] Page 6 of 7 Discharge Instructions: Wash your hands with soap and water. Remove old dressing, discard into plastic bag and place into trash. Cleanse the wound with Wound Cleanser prior to applying  [name] clean dressing using gauze sponges, not tissues or cotton balls. Do not scrub or use excessive force. Pat dry using gauze sponges, not tissue or cotton balls. Topical: Gentamicin 1 x Per Week/30 Days Discharge Instructions: Apply as directed by provider. Topical: Mupirocin Ointment 1 x Per Week/30 Days Discharge Instructions: Apply as directed by provider. Prim Dressing: Hydrofera Blue Ready Transfer Foam, 2.5x2.5 (in/in) 1 x Per Week/30 Days ary Discharge Instructions: Apply Hydrofera Blue Ready to wound bed as directed Secondary Dressing: ABD Pad 5x9 (in/in) 1 x Per Week/30 Days Discharge Instructions: Cover with ABD pad Com pression Wrap: 3-LAYER WRAP - Profore Lite LF 3 Multilayer Compression Bandaging System 1 x Per Week/30 Days Discharge Instructions: Apply 3 multi-layer wrap as prescribed. 1. Antibiotic ointment with Hydrofera Blue under 3 layer compressionooright lower extremity 2. Follow-up in 1 week Electronic Signature(s) Signed:  04/14/2022 12:00:48 PM By: Kalman Shan DO Entered By: Kalman Shan on 04/14/2022 09:32:23 -------------------------------------------------------------------------------- ROS/PFSH Details Patient Name: Date of Service: [name]. 04/14/2022 8:45 [name] Medical Record Number: [id number] Patient Account Number: [id number] Date of Birth/Sex: Treating RN: June 03, 1945 (76 y.o. [name]) Carlene Coria Primary Care Provider: Serina [name], [facility] [name] Other Clinician: Referring Provider: Treating Provider/Extender: [name], [name] Weeks in Treatment: 0 Information Obtained From Patient Integumentary (Skin) Complaints and Symptoms: Positive for: Wounds; Swelling Cardiovascular Medical History: Positive for: Coronary Artery Disease; Hypertension; Myocardial Infarction Endocrine Medical History: Positive for: Type II Diabetes Time with diabetes: 5 Immunizations Pneumococcal Vaccine: Received Pneumococcal Vaccination: Yes Received Pneumococcal Vaccination On or After 60th Birthday: Yes Implantable Devices None Family and Social History Former smoker; Marital Status - Married; Alcohol Use: Never; Drug Use: No History; Caffeine Use: Never JADUS, YACK [name] ([id number]) [id number] Page 7 of 7 Electronic Signature(s) Signed: 04/14/2022 12:00:48 PM By: Kalman Shan DO Signed: 04/16/2022 2:19:49 PM By: Carlene Coria RN Entered By: Carlene Coria on 04/14/2022 08:55:35 -------------------------------------------------------------------------------- SuperBill Details Patient Name: Date of Service: [name]. 04/14/2022 Medical Record Number: [id number] Patient Account Number: [id number] Date of Birth/Sex: Treating RN: 1945-11-02 (76 y.o. [name]) Carlene Coria Primary Care Provider: Serina [name], [name] Other Clinician: Referring Provider: Treating Provider/Extender: Wendall Papa, [name] Weeks in Treatment: 0 Diagnosis Coding ICD-10 Codes Code  Description [phone] Non-pressure chronic ulcer of other part of right lower leg with fat layer exposed I87.311 Chronic venous hypertension (idiopathic) with ulcer of right lower extremity E11.622 Type 2 diabetes mellitus with other skin ulcer G47.33 Obstructive sleep apnea (adult) (pediatric) Facility Procedures : 7 CPT4 Code: [id number] Description: [place] VISIT-LEV 3 EST PT Modifier: Quantity: 1 Physician Procedures : CPT4 Code Description Modifier [id number] - WC PHYS LEVEL 4 - NEW PT ICD-10 Diagnosis Description [id number] Non-pressure chronic ulcer of other part of right lower leg with fat layer exposed I87.311 Chronic venous hypertension (idiopathic) with ulcer  of right lower extremity E11.622 Type 2 diabetes mellitus with other skin ulcer G47.33 Obstructive sleep apnea (adult) (pediatric) Quantity: 1 Electronic Signature(s) Signed: 04/14/2022 10:02:17 AM By: Carlene Coria RN Signed: 04/14/2022 12:00:48 PM By: Kalman Shan DO Entered By: Carlene Coria on 04/14/2022 10:02:17

## 2022-04-27 ENCOUNTER — Ambulatory Visit: Payer: Medicare HMO

## 2022-04-27 NOTE — Progress Notes (Signed)
[name] ([id number]) [id number] Page 1 of 7 Visit Report for 04/21/2022 Chief Complaint Document Details Patient Name: Date of Service: [name], [name]. 04/21/2022 3:45 PM Medical Record Number: [id number] Patient Account Number: [id number] Date of Birth/Sex: Treating RN: July 31, 1945 (77 y.o. [name]) [name] Primary Care Provider: Serina [name], [facility] Other Clinician: Referring Provider: Treating Provider/Extender: [name], [name] Weeks in Treatment: 1 Information Obtained from: Patient Chief Complaint 04/14/2022; right lower extremity wound Electronic Signature(s) Signed: 04/21/2022 4:39:06 PM By: [name] Shan DO Entered By: [name] on 04/21/2022 16:29:20 -------------------------------------------------------------------------------- HPI Details Patient Name: Date of Service: [name]. 04/21/2022 3:45 PM Medical Record Number: [id number] Patient Account Number: [id number] Date of Birth/Sex: Treating RN: 09-Sep-1945 (77 y.o. [name]) [name] Primary Care Provider: Serina [name], [facility] Other Clinician: Referring Provider: Treating Provider/Extender: [name], [name] Weeks in Treatment: 1 History of Present Illness HPI Description: 04/14/2022 [name] is [name] with [name] past medical history of diet-controlled type 2 diabetes, OSA, venous insufficiency and CAD that presents to the clinic for [name] to the right lower extremity. He states the wound started spontaneously and he has been using Neosporin to the area. He wears compression stockings daily. He currently denies signs of infection. He denies pain to the site. The wound has remained stable over the past 6 months. ABIs in office were 1.08 on the right. 2/21; patient presents for follow-up. He did not tolerate the compression wrap well. He states it became too tight at the top and he did cut into it to relieve  some of the pressure. He denies signs of infection. Electronic Signature(s) Signed: 04/21/2022 4:39:06 PM By: [name] Shan DO Entered By: [name] on 04/21/2022 16:29:51 Pike, [name] ([id number]) [phone].pdf Page 2 of 7 -------------------------------------------------------------------------------- CHEM CAUT GRANULATION TISS Details Patient Name: Date of Service: [name]. 04/21/2022 3:45 PM Medical Record Number: [id number] Patient Account Number: [id number] Date of Birth/Sex: Treating RN: [birth year] (77 y.o. [name]) [name] Primary Care Provider: Serina [name], [facility] Other Clinician: Referring Provider: Treating Provider/Extender: [name], [name] Weeks in Treatment: 1 Procedure Performed for: Wound #1 Right,Medial Lower Leg Performed By: Physician [name] Shan, MD Post Procedure Diagnosis Same as Pre-procedure Electronic Signature(s) Signed: 04/26/2022 4:28:51 PM By: [name] Coria RN Entered By: [name] on 04/21/2022 16:14:02 -------------------------------------------------------------------------------- Physical Exam Details Patient Name: Date of Service: [name]. 04/21/2022 3:45 PM Medical Record Number: [id number] Patient Account Number: [id number] Date of Birth/Sex: Treating RN: [birth year] (77 y.o. [name] Primary Care Provider: Serina [name], [facility] Other Clinician: Referring Provider: Treating Provider/Extender: [name], [name] Weeks in Treatment: 1 Constitutional . Cardiovascular . Psychiatric . Notes Right lower extremity: 2+ pitting edema to the knee. Circular open wound to the anterior aspect with nonviable tissue and hypergranulated tissue. No signs of surrounding infection including increased warmth, erythema or purulent drainage. Venous stasis dermatitis noted throughout the leg. Varicose veins around the ankle. Electronic Signature(s) Signed: 04/21/2022 4:39:06  PM By: [name] Shan DO Entered By: [name] on 04/21/2022 16:30:24 [name], [name] ([id number]) [phone].pdf Page 3 of 7 -------------------------------------------------------------------------------- Physician Orders Details Patient Name: Date of Service: [name]. 04/21/2022 3:45 PM Medical Record Number: [id number] Patient Account Number: [id number] Date of Birth/Sex: Treating RN: Feb 04, 1946 (77 y.o. [name]) [name] Primary Care Provider: Serina [name], [facility] Other Clinician: Referring Provider: Treating Provider/Extender: [name], [name] Weeks in  Treatment: 1 Verbal / Phone Orders: No Diagnosis Coding Follow-up Appointments Return Appointment in 1 week. Bathing/ Shower/ Hygiene May shower with wound dressing protected with water repellent cover or cast protector. Anesthetic (Use 'Patient Medications' Section for Anesthetic Order Entry) Lidocaine applied to wound bed Edema Control - Lymphedema / Segmental Compressive Device / Other Optional: One layer of unna paste to top of compression wrap (to act as an anchor). Elevate, Exercise Daily and [name] void Standing for Long Periods of Time. Elevate legs to the level of the heart and pump ankles as often as possible Elevate leg(s) parallel to the floor when sitting. Wound Treatment Wound #1 - Lower Leg Wound Laterality: Right, Medial Cleanser: Soap and Water 1 x Per Week/30 Days Discharge Instructions: Gently cleanse wound with antibacterial soap, rinse and pat dry prior to dressing wounds Cleanser: Wound Cleanser 1 x Per Week/30 Days Discharge Instructions: Wash your hands with soap and water. Remove old dressing, discard into plastic bag and place into trash. Cleanse the wound with Wound Cleanser prior to applying [name] clean dressing using gauze sponges, not tissues or cotton balls. Do not scrub or use excessive force. Pat dry using gauze sponges, not tissue or cotton  balls. Topical: Activon Honey Gel, 25 (g) Tube 1 x Per Week/30 Days Prim Dressing: Hydrofera Blue Ready Transfer Foam, 2.5x2.5 (in/in) 1 x Per Week/30 Days ary Discharge Instructions: Apply Hydrofera Blue Ready to wound bed as directed Secondary Dressing: ABD Pad 5x9 (in/in) 1 x Per Week/30 Days Discharge Instructions: Cover with ABD pad Secured With: Tubigrip Size D, 3x10 (in/yd) 1 x Per Week/30 Days Electronic Signature(s) Signed: 04/21/2022 4:39:06 PM By: [name] Shan DO Entered By: [name] on 04/21/2022 16:34:17 Carew, [name] ([id number]) [id number] Page 4 of 7 -------------------------------------------------------------------------------- Problem List Details Patient Name: Date of Service: [name], [name]. 04/21/2022 3:45 PM Medical Record Number: [id number] Patient Account Number: [id number] Date of Birth/Sex: Treating RN: August 04, 1945 (76 y.o. [name]) [name] Primary Care Provider: Serina [name], [facility] Other Clinician: Referring Provider: Treating Provider/Extender: [name], [name] Weeks in Treatment: 1 Active Problems ICD-10 Encounter Code Description Active Date MDM Diagnosis L97.812 Non-pressure chronic ulcer of other part of right lower leg with fat layer 04/14/2022 No Yes exposed I87.311 Chronic venous hypertension (idiopathic) with ulcer of right lower extremity 04/14/2022 No Yes E11.622 Type 2 diabetes mellitus with other skin ulcer 04/14/2022 No Yes G47.33 Obstructive sleep apnea (adult) (pediatric) 04/14/2022 No Yes Inactive Problems Resolved Problems Electronic Signature(s) Signed: 04/21/2022 4:39:06 PM By: [name] Shan DO Entered By: [name] on 04/21/2022 16:14:18 -------------------------------------------------------------------------------- Progress Note Details Patient Name: Date of Service: [name]. 04/21/2022 3:45 PM Medical Record Number: [id number] Patient Account Number:  [id number] Date of Birth/Sex: Treating RN: 1945-07-15 (76 y.o. [name] Primary Care Provider: Serina [name], [facility] Other Clinician: Referring Provider: Treating Provider/Extender: [name], [name] Weeks in Treatment: 1 [name], [name] ([id number]) [id number] Page 5 of 7 Subjective Chief Complaint Information obtained from Patient 04/14/2022; right lower extremity wound History of Present Illness (HPI) 04/14/2022 [name] is [name] with [name] past medical history of diet-controlled type 2 diabetes, OSA, venous insufficiency and CAD that presents to the clinic for [name] to the right lower extremity. He states the wound started spontaneously and he has been using Neosporin to the area. He wears compression stockings daily. He currently denies signs of infection. He denies pain to the site. The wound  has remained stable over the past 6 months. ABIs in office were 1.08 on the right. 2/21; patient presents for follow-up. He did not tolerate the compression wrap well. He states it became too tight at the top and he did cut into it to relieve some of the pressure. He denies signs of infection. Objective Constitutional Vitals Time Taken: 4:00 PM, Height: 67 in, Weight: 224 lbs, BMI: 35.1, Temperature: 97.7 F, Pulse: 80 bpm, Respiratory Rate: 18 breaths/min, Blood Pressure: 146/74 mmHg. General Notes: Right lower extremity: 2+ pitting edema to the knee. Circular open wound to the anterior aspect with nonviable tissue and hypergranulated tissue. No signs of surrounding infection including increased warmth, erythema or purulent drainage. Venous stasis dermatitis noted throughout the leg. Varicose veins around the ankle. Integumentary (Hair, Skin) Wound #1 status is Open. Original cause of wound was Gradually Appeared. The date acquired was: 09/29/2021. The wound has been in treatment 1 weeks. The wound is  located on the Right,Medial Lower Leg. The wound measures 1cm length x 1cm width x 0.1cm depth; 0.785cm^2 area and 0.079cm^3 volume. There is Fat Layer (Subcutaneous Tissue) exposed. There is no tunneling or undermining noted. There is [name] medium amount of serosanguineous drainage noted. There is medium (34-66%) red, pink granulation within the wound bed. There is [name] medium (34-66%) amount of necrotic tissue within the wound bed including Adherent Slough. Assessment Active Problems ICD-10 Non-pressure chronic ulcer of other part of right lower leg with fat layer exposed Chronic venous hypertension (idiopathic) with ulcer of right lower extremity Type 2 diabetes mellitus with other skin ulcer Obstructive sleep apnea (adult) (pediatric) Patient's wound is stable. He did not tolerate the compression wrap. At this time I recommended Medihoney and Hydrofera Blue to the wound bed under compression stockings daily. We gave him information to order compression stockings. For now he can use Tubigrip. I also use silver nitrate to the hyper granulated areas. Procedures Wound #1 Pre-procedure diagnosis of Wound #1 is [name] Diabetic Wound/Ulcer of the Lower Extremity located on the Right,Medial Lower Leg . An CHEM CAUT GRANULATION TISS procedure was performed by [name] Shan, MD. Post procedure Diagnosis Wound #1: Same as Pre-Procedure Plan Follow-up Appointments: Return Appointment in 1 week. Bathing/ Shower/ Hygiene: May shower with wound dressing protected with water repellent cover or cast protector. Anesthetic (Use 'Patient Medications' Section for Anesthetic Order Entry): Lidocaine applied to wound bed [name], [name] ([id number]) [phone].pdf Page 6 of 7 Edema Control - Lymphedema / Segmental Compressive Device / Other: Optional: One layer of unna paste to top of compression wrap (to act as an anchor). Elevate, Exercise Daily and Avoid Standing for Long Periods of  Time. Elevate legs to the level of the heart and pump ankles as often as possible Elevate leg(s) parallel to the floor when sitting. WOUND #1: - Lower Leg Wound Laterality: Right, Medial Cleanser: Soap and Water 1 x Per Week/30 Days Discharge Instructions: Gently cleanse wound with antibacterial soap, rinse and pat dry prior to dressing wounds Cleanser: Wound Cleanser 1 x Per Week/30 Days Discharge Instructions: Wash your hands with soap and water. Remove old dressing, discard into plastic bag and place into trash. Cleanse the wound with Wound Cleanser prior to applying [name] clean dressing using gauze sponges, not tissues or cotton balls. Do not scrub or use excessive force. Pat dry using gauze sponges, not tissue or cotton balls. Topical: Activon Honey Gel, 25 (g) Tube 1 x Per Week/30 Days Prim Dressing: Hydrofera Blue Ready Transfer Foam, 2.5x2.5 (in/in)  1 x Per Week/30 Days ary Discharge Instructions: Apply Hydrofera Blue Ready to wound bed as directed Secondary Dressing: ABD Pad 5x9 (in/in) 1 x Per Week/30 Days Discharge Instructions: Cover with ABD pad Secured With: Tubigrip Size D, 3x10 (in/yd) 1 x Per Week/30 Days 1. Silver nitrate 2. Medihoney and Hydrofera Blue 3. Compression stockings daily 4. Follow-up in 1 week Electronic Signature(s) Signed: 04/21/2022 4:39:06 PM By: [name] Shan DO Entered By: [name] on 04/21/2022 16:33:59 -------------------------------------------------------------------------------- SuperBill Details Patient Name: Date of Service: [name]. 04/21/2022 Medical Record Number: [id number] Patient Account Number: [id number] Date of Birth/Sex: Treating RN: 26-Mar-1945 (76 y.o. [name]) [name] Primary Care Provider: Serina [name], [name] Other Clinician: Referring Provider: Treating Provider/Extender: [name], [name] Weeks in Treatment: 1 Diagnosis Coding ICD-10 Codes Code Description [id number] Non-pressure chronic ulcer of  other part of right lower leg with fat layer exposed I87.311 Chronic venous hypertension (idiopathic) with ulcer of right lower extremity E11.622 Type 2 diabetes mellitus with other skin ulcer G47.33 Obstructive sleep apnea (adult) (pediatric) Facility Procedures : CPT4 Code: [id number] Description: [id number] - CHEM CAUT GRANULATION TISS ICD-10 Diagnosis Description L97.812 Non-pressure chronic ulcer of other part of right lower leg with fat layer expo I87.311 Chronic venous hypertension (idiopathic) with ulcer of right lower extremity  E11.622 Type 2 diabetes mellitus with other skin ulcer Modifier: sed Quantity: 1 Physician Procedures : CPT4 Code Description Modifier [id number] - WC PHYS CHEM CAUT GRAN TISSUE ICD-10 Diagnosis Description SAHAS, BERRIEN ([id number]) [id number] L97.812 Non-pressure chronic ulcer of other part of right lower leg with fat layer  exposed I87.311 Chronic venous hypertension (idiopathic) with ulcer of right lower extremity E11.622 Type 2 diabetes mellitus with other skin ulcer Quantity: 1 .pdf Page 7 of 7 Electronic Signature(s) Signed: 04/21/2022 4:39:06 PM By: [name] Shan DO Entered By: [name] on 04/21/2022 16:34:10

## 2022-04-27 NOTE — Progress Notes (Signed)
[name] ([id number]) [id number] Page 1 of 7 Visit Report for 04/21/2022 Arrival Information Details Patient Name: Date of Service: [name]. 04/21/2022 3:45 PM Medical Record Number: [id number] Patient Account Number: [id number] Date of Birth/Sex: Treating RN: 11/29/1945 (77 y.o. [name]) [name] Primary Care [name]: [name], [facility] Other Clinician: Referring [name]: Treating Aveer Bartow/Extender: [name], [name] in Treatment: 1 Visit Information History Since Last Visit Added or deleted any medications: No Patient Arrived: [name] Any new allergies or adverse reactions: No Arrival Time: 15:56 Had [name] fall or experienced change in No Accompanied By: wife activities of daily living that may affect Transfer Assistance: None risk of falls: Patient Identification Verified: Yes Signs or symptoms of abuse/neglect since last visito No Secondary Verification Process Completed: Yes Hospitalized since last visit: No Patient Requires Transmission-Based Precautions: No Implantable device outside of the clinic excluding No Patient Has Alerts: Yes cellular tissue based products placed in the center since last visit: Has Dressing in Place as Prescribed: Yes Has Compression in Place as Prescribed: Yes Pain Present Now: No Electronic Signature(s) Signed: 04/26/2022 4:28:51 PM By: [name] Coria RN Entered By: [name] on 04/21/2022 16:00:18 -------------------------------------------------------------------------------- Lower Extremity Assessment Details Patient Name: Date of Service: [name]. 04/21/2022 3:45 PM Medical Record Number: [id number] Patient Account Number: [id number] Date of Birth/Sex: Treating RN: May 15, 1945 (77 y.o. [name]) [name] Primary Care [name]: [name], [facility] Other Clinician: Referring Zohair Epp: Treating Kassidy Dockendorf/Extender: Isaac Laud NKLIN, [name] in Treatment: 1 Edema  Assessment Assessed: [Left: No] [Right: No] Edema: [Left: Ye] [Right: s] Calf Left: Right: Point of Measurement: 32 cm From Medial Instep 41.2 cm [name], [name] ([id number]) [id number].pdf Page 2 of 7 Ankle Left: Right: Point of Measurement: 10 cm From Medial Instep 26 cm Knee To Floor Left: Right: From Medial Instep 42 cm Vascular Assessment Pulses: Dorsalis Pedis Palpable: [Right:Yes] Electronic Signature(s) Signed: 04/26/2022 4:28:51 PM By: [name] Coria RN Entered By: [name] on 04/21/2022 16:05:55 -------------------------------------------------------------------------------- Multi Wound Chart Details Patient Name: Date of Service: [name]. 04/21/2022 3:45 PM Medical Record Number: [id number] Patient Account Number: [id number] Date of Birth/Sex: Treating RN: 04/02/1945 (77 y.o. [name]) [name] Primary Care [name]: [name], [name] Other Clinician: Referring Demeshia Sherburne: Treating Jared Cahn/Extender: Isaac Laud NKLIN, [name] in Treatment: 1 Vital Signs Height(in): 67 Pulse(bpm): 80 Weight(lbs): 224 Blood Pressure(mmHg): 146/74 Body Mass Index(BMI): 35.1 Temperature(F): 97.7 Respiratory Rate(breaths/min): 18 [1:Photos:] [N/[name]:N/[name]] Right, Medial Lower Leg N/[name] N/[name] Wound Location: Gradually Appeared N/[name] N/[name] Wounding Event: Diabetic Wound/Ulcer of the Lower N/[name] N/[name] Primary Etiology: Extremity Coronary Artery Disease, N/[name] N/[name] Comorbid History: Hypertension, Myocardial Infarction, Type II Diabetes 09/29/2021 N/[name] N/[name] Date Acquired: 1 N/[name] N/[name] [name] of Treatment: Open N/[name] N/[name] Wound Status: No N/[name] N/[name] Wound Recurrence: 1x1x0.1 N/[name] N/[name] Measurements L x W x D (cm) 0.785 N/[name] N/[name] [name] (cm) : rea 0.079 N/[name] N/[name] Volume (cm) : [name], [name] ([id number]) [id number] Page 3 of 7 0.00% N/[name] N/[name] % Reduction in [name] rea: 0.00% N/[name] N/[name] % Reduction in Volume: Grade 1 N/[name] N/[name] Classification: Medium N/[name]  N/[name] Exudate [name] mount: Serosanguineous N/[name] N/[name] Exudate Type: red, brown N/[name] N/[name] Exudate Color: Medium (34-66%) N/[name] N/[name] Granulation [name] mount: Red, Pink N/[name] N/[name] Granulation Quality: Medium (34-66%) N/[name] N/[name] Necrotic [name] mount: Fat Layer (Subcutaneous Tissue): Yes N/[name] N/[name] Exposed Structures: Fascia: No Tendon: No Muscle: No Joint: No Bone: No None N/[name] N/[name] Epithelialization: Treatment Notes Electronic Signature(s) Signed: 04/26/2022 4:28:51 PM  By: [name] Coria RN Entered By: [name] on 04/21/2022 16:05:59 -------------------------------------------------------------------------------- Multi-Disciplinary Care Plan Details Patient Name: Date of Service: [name]. 04/21/2022 3:45 PM Medical Record Number: [id number] Patient Account Number: [id number] Date of Birth/Sex: Treating RN: October 24, 1945 (77 y.o. [name]) [name] Primary Care Dellene Mcgroarty: [name], [facility] Other Clinician: Referring Dahlia Nifong: Treating Kamarian Sahakian/Extender: Isaac Laud NKLIN, [name] in Treatment: 1 Active Inactive Abuse / Safety / Falls / Self Care Management Nursing Diagnoses: Potential for injury related to abuse or neglect Goals: Patient will not experience any injury related to falls Date Initiated: 04/14/2022 Target Resolution Date: 05/13/2022 Goal Status: Active Interventions: Assess Activities of Daily Living upon admission and as needed Assess fall risk on admission and as needed Assess: immobility, friction, shearing, incontinence upon admission and as needed Assess impairment of mobility on admission and as needed per policy Assess personal safety and home safety (as indicated) on admission and as needed Assess self care needs on admission and as needed Notes: Necrotic Tissue Nursing Diagnoses: Knowledge deficit related to management of necrotic/devitalized tissue Goals: Necrotic/devitalized tissue will be minimized in the wound bed [name], [name] ([id number])  [phone] Page 4 of 7 Date Initiated: 04/14/2022 Target Resolution Date: 05/13/2022 Goal Status: Active Interventions: Assess patient pain level pre-, during and post procedure and prior to discharge Provide education on necrotic tissue and debridement process Notes: Wound/Skin Impairment Nursing Diagnoses: Knowledge deficit related to ulceration/compromised skin integrity Goals: Patient/caregiver will verbalize understanding of skin care regimen Date Initiated: 04/14/2022 Target Resolution Date: 05/13/2022 Goal Status: Active Ulcer/skin breakdown will have [name] volume reduction of 30% by week 4 Date Initiated: 04/14/2022 Target Resolution Date: 05/13/2022 Goal Status: Active Ulcer/skin breakdown will have [name] volume reduction of 50% by week 8 Date Initiated: 04/14/2022 Target Resolution Date: 06/13/2022 Goal Status: Active Ulcer/skin breakdown will have [name] volume reduction of 80% by week 12 Date Initiated: 04/14/2022 Target Resolution Date: 07/13/2022 Goal Status: Active Ulcer/skin breakdown will heal within 14 [name] Date Initiated: 04/14/2022 Target Resolution Date: 08/13/2022 Goal Status: Active Interventions: Assess patient/caregiver ability to obtain necessary supplies Assess patient/caregiver ability to perform ulcer/skin care regimen upon admission and as needed Assess ulceration(s) every visit Notes: Electronic Signature(s) Signed: 04/26/2022 4:28:51 PM By: [name] Coria RN Entered By: [name] on 04/21/2022 16:06:06 -------------------------------------------------------------------------------- Pain Assessment Details Patient Name: Date of Service: [name], MOSCHELLA MES [name]. 04/21/2022 3:45 PM Medical Record Number: [id number] Patient Account Number: [id number] Date of Birth/Sex: Treating RN: 1945/11/27 (76 y.o. [name] Primary Care Doyle Tegethoff: [name], [facility] Other Clinician: Referring Batoul Limes: Treating Jinger Middlesworth/Extender: [name],  [name] in Treatment: 1 Active Problems Location of Pain Severity and Description of Pain Patient Has Paino No Site Locations [name], [name] [name] ([id number]) [id number] Page 5 of 7 Pain Management and Medication Current Pain Management: Electronic Signature(s) Signed: 04/26/2022 4:28:51 PM By: [name] Coria RN Entered By: [name] on 04/21/2022 16:00:45 -------------------------------------------------------------------------------- Patient/Caregiver Education Details Patient Name: Date of Service: [name]. 2/21/2024andnbsp3:45 PM Medical Record Number: [id number] Patient Account Number: [id number] Date of Birth/Gender: Treating RN: 10-15-1945 (76 y.o. [name]) [name] Primary Care Physician: [name], [facility] Other Clinician: Referring Physician: Treating Physician/Extender: [name], [name] in Treatment: 1 Education Assessment Education Provided To: Patient Education Topics Provided Wound/Skin Impairment: Methods: Explain/Verbal Responses: State content correctly Electronic Signature(s) Signed: 04/26/2022 4:28:51 PM By: [name] Coria RN Entered By: [name] on 04/21/2022 [place] [name], [name] ([id number]) [id number].pdf Page 6 of 7 -------------------------------------------------------------------------------- Wound Assessment  Details Patient Name: Date of Service: [name], TARGETT MES [name]. 04/21/2022 3:45 PM Medical Record Number: [id number] Patient Account Number: [id number] Date of Birth/Sex: Treating RN: 05-26-1945 (76 y.o. [name]) [name] Primary Care Sesilia Poucher: [name], [name] Other Clinician: Referring Ezeriah Luty: Treating Haydee Jabbour/Extender: Isaac Laud NKLIN, [name] in Treatment: 1 Wound Status Wound Number: 1 Primary Diabetic Wound/Ulcer of the Lower Extremity Etiology: Wound Location: Right, Medial Lower Leg Wound Open Wounding Event: Gradually Appeared Status: Date  Acquired: 09/29/2021 Comorbid Coronary Artery Disease, Hypertension, Myocardial Infarction, [name] Of Treatment: 1 History: Type II Diabetes Clustered Wound: No Photos Wound Measurements Length: (cm) 1 Width: (cm) 1 Depth: (cm) 0.1 Area: (cm) 0.785 Volume: (cm) 0.079 % Reduction in Area: 0% % Reduction in Volume: 0% Epithelialization: None Tunneling: No Undermining: No Wound Description Classification: Grade 1 Exudate Amount: Medium Exudate Type: Serosanguineous Exudate Color: red, brown Foul Odor After Cleansing: No Slough/Fibrino Yes Wound Bed Granulation Amount: Medium (34-66%) Exposed Structure Granulation Quality: Red, Pink Fascia Exposed: No Necrotic Amount: Medium (34-66%) Fat Layer (Subcutaneous Tissue) Exposed: Yes Necrotic Quality: Adherent Slough Tendon Exposed: No Muscle Exposed: No Joint Exposed: No Bone Exposed: No Electronic Signature(s) Signed: 04/26/2022 4:28:51 PM By: [name] Coria RN Entered By: [name] on 04/21/2022 [place], [name] ([id number] Page 7 of 7 -------------------------------------------------------------------------------- Vitals Details Patient Name: Date of Service: [name], [name] MES [name]. 04/21/2022 3:45 PM Medical Record Number: [id number] Patient Account Number: [id number] Date of Birth/Sex: Treating RN: January 19, 1946 (76 y.o. [name]) [name] Primary Care Nikolaos Maddocks: [name], [name] Other Clinician: Referring Roldan Laforest: Treating Abisai Deer/Extender: Isaac Laud NKLIN, [name] in Treatment: 1 Vital Signs Time Taken: 16:00 Temperature (F): 97.7 Height (in): 67 Pulse (bpm): 80 Weight (lbs): 224 Respiratory Rate (breaths/min): 18 Body Mass Index (BMI): 35.1 Blood Pressure (mmHg): 146/74 Reference Range: 80 - 120 mg / dl Electronic Signature(s) Signed: 04/26/2022 4:28:51 PM By: [name] Coria RN Entered By: [name] on 04/21/2022 16:00:35

## 2022-04-28 ENCOUNTER — Encounter (HOSPITAL_BASED_OUTPATIENT_CLINIC_OR_DEPARTMENT_OTHER): Payer: Medicare HMO | Admitting: Internal Medicine

## 2022-04-28 DIAGNOSIS — E11622 Type 2 diabetes mellitus with other skin ulcer: Secondary | ICD-10-CM

## 2022-04-28 DIAGNOSIS — L97812 Non-pressure chronic ulcer of other part of right lower leg with fat layer exposed: Secondary | ICD-10-CM

## 2022-04-28 DIAGNOSIS — I87311 Chronic venous hypertension (idiopathic) with ulcer of right lower extremity: Secondary | ICD-10-CM | POA: Diagnosis not present

## 2022-04-29 ENCOUNTER — Ambulatory Visit: Payer: Medicare HMO

## 2022-05-01 ENCOUNTER — Other Ambulatory Visit: Payer: Self-pay | Admitting: Urology

## 2022-05-03 NOTE — Progress Notes (Signed)
TECUMSEH, JAVADI [name] ([id number]) [id number] Page 1 of 9 Visit Report for 04/28/2022 Arrival Information Details Patient Name: Date of Service: [name], [name]. 04/28/2022 8:15 [name] Medical Record Number: [id number] Patient Account Number: [id number] Date of Birth/Sex: Treating RN: 07/20/1945 (77 y.o. [name] Primary Care [name]: [name], [facility] Other Clinician: Massie [name] Referring [name]: Treating [name]/Extender: [name], [name] in Treatment: 2 Visit Information History Since Last Visit All ordered tests and consults were completed: No Patient Arrived: [name] Added or deleted any medications: No Arrival Time: 08:16 Any new allergies or adverse reactions: No Transfer Assistance: None Had [name] fall or experienced change in No Patient Identification Verified: Yes activities of daily living that may affect Secondary Verification Process Completed: Yes risk of falls: Patient Requires Transmission-Based Precautions: No Signs or symptoms of abuse/neglect since last visito No Patient Has Alerts: Yes Hospitalized since last visit: No Patient Alerts: Patient on Blood Thinner Implantable device outside of the clinic excluding No Baby ASA '81mg'$  cellular tissue based products placed in the center since last visit: Has Dressing in Place as Prescribed: Yes Has Compression in Place as Prescribed: Yes Pain Present Now: Yes Electronic Signature(s) Signed: 04/30/2022 1:35:31 PM By: [name] Entered By: [name] on 04/28/2022 08:17:05 -------------------------------------------------------------------------------- Clinic Level of Care Assessment Details Patient Name: Date of Service: [name] [name]. 04/28/2022 8:15 [name] Medical Record Number: [id number] Patient Account Number: [id number] Date of Birth/Sex: Treating RN: Feb 04, 1946 (77 y.o. [name] Primary Care [name]: [name], [facility] Other Clinician: Massie [name] Referring [name]: Treating [name]/Extender: [name] [name] in Treatment: 2 Clinic Level of Care Assessment Items TOOL 1 Quantity Score '[]'$  - 0 Use when EandM and Procedure is performed on INITIAL visit ASSESSMENTS - Nursing Assessment / Reassessment '[]'$  - 0 General Physical Exam (combine w/ comprehensive assessment (listed just below) when performed on new pt. evals) [name], [name] ([id number]) [id number] Page 2 of 9 '[]'$  - 0 Comprehensive Assessment (HX, ROS, Risk Assessments, Wounds Hx, etc.) ASSESSMENTS - Wound and Skin Assessment / Reassessment '[]'$  - 0 Dermatologic / Skin Assessment (not related to wound area) ASSESSMENTS - Ostomy and/or Continence Assessment and Care '[]'$  - 0 Incontinence Assessment and Management '[]'$  - 0 Ostomy Care Assessment and Management (repouching, etc.) PROCESS - Coordination of Care '[]'$  - 0 Simple Patient / Family Education for ongoing care '[]'$  - 0 Complex (extensive) Patient / Family Education for ongoing care '[]'$  - 0 Staff obtains Programmer, systems, Records, T Results / Process Orders est '[]'$  - 0 Staff telephones HHA, Nursing Homes / Clarify orders / etc '[]'$  - 0 Routine Transfer to another Facility (non-emergent condition) '[]'$  - 0 Routine Hospital Admission (non-emergent condition) '[]'$  - 0 New Admissions / Biomedical engineer / Ordering NPWT Apligraf, etc. , '[]'$  - 0 Emergency Hospital Admission (emergent condition) PROCESS - Special Needs '[]'$  - 0 Pediatric / Minor Patient Management '[]'$  - 0 Isolation Patient Management '[]'$  - 0 Hearing / Language / Visual special needs '[]'$  - 0 Assessment of Community assistance (transportation, D/C planning, etc.) '[]'$  - 0 Additional assistance / Altered mentation '[]'$  - 0 Support Surface(s) Assessment (bed, cushion, seat, etc.) INTERVENTIONS - Miscellaneous '[]'$  - 0 External ear exam '[]'$  - 0 Patient Transfer (multiple staff / Civil Service fast streamer / Similar devices) '[]'$  -  0 Simple Staple / Suture removal (25 or less) '[]'$  - 0 Complex Staple / Suture removal (26 or more) '[]'$  - 0 Hypo/Hyperglycemic Management (do not check  if billed separately) '[]'$  - 0 Ankle / Brachial Index (ABI) - do not check if billed separately Has the patient been seen at the hospital within the last three years: Yes Total Score: 0 Level Of Care: ____ Electronic Signature(s) Signed: 04/30/2022 1:35:31 PM By: [name] Entered By: [name] on 04/28/2022 08:41:11 -------------------------------------------------------------------------------- Encounter Discharge Information Details Patient Name: Date of Service: [name]. 04/28/2022 8:15 [name] Medical Record Number: [id number] Patient Account Number: [id number] Date of Birth/Sex: Treating RN: November 23, 1945 (77 y.o. [name] Primary Care [name]: [name], [facility] Other Clinician: [name], [name] ([id number]) [id number] Page 3 of 9 Referring [name]: Treating [name]/Extender: [name], [name] in Treatment: 2 Encounter Discharge Information Items Post Procedure Vitals Discharge Condition: Stable Temperature (F): 97.8 Ambulatory Status: Walker Pulse (bpm): 71 Discharge Destination: Home Respiratory Rate (breaths/min): 18 Transportation: Private Auto Blood Pressure (mmHg): 106/68 Accompanied By: wife Schedule Follow-up Appointment: Yes Clinical Summary of Care: Electronic Signature(s) Signed: 04/30/2022 1:35:31 PM By: [name] Entered By: [name] on 04/28/2022 08:55:17 -------------------------------------------------------------------------------- Lower Extremity Assessment Details Patient Name: Date of Service: [name], [name]. 04/28/2022 8:15 [name] Medical Record Number: [id number] Patient Account Number: [id number] Date of Birth/Sex: Treating RN: 06/03/1945 (77 y.o. [name] Primary Care Keona Sheffler: [name], [name] Other Clinician:  Massie [name] Referring Jermeka Schlotterbeck: Treating Ena Demary/Extender: [name], [name] in Treatment: 2 Edema Assessment Assessed: [Left: No] [Right: Yes] Edema: [Left: Ye] [Right: s] Calf Left: Right: Point of Measurement: 32 cm From Medial Instep 39 cm Ankle Left: Right: Point of Measurement: 10 cm From Medial Instep 24.1 cm Vascular Assessment Pulses: Dorsalis Pedis Palpable: [Right:Yes] Electronic Signature(s) Signed: 04/29/2022 6:12:13 PM By: Gretta Cool, BSN, RN, CWS, Kim RN, BSN Signed: 04/30/2022 1:35:31 PM By: [name] Entered By: [name] on 04/28/2022 08:30:20 Lie, Gerda Diss ([id number]) [phone] Page 4 of 9 -------------------------------------------------------------------------------- Multi Wound Chart Details Patient Name: Date of Service: [name], [name]. 04/28/2022 8:15 [name] Medical Record Number: [id number] Patient Account Number: [id number] Date of Birth/Sex: Treating RN: January 02, 1946 (77 y.o. [name] Primary Care Harding Thomure: [name], [name] Other Clinician: Massie [name] Referring Terryl Molinelli: Treating Jaymere Alen/Extender: [name] [name] in Treatment: 2 Vital Signs Height(in): 67 Pulse(bpm): 71 Weight(lbs): 224 Blood Pressure(mmHg): 106/68 Body Mass Index(BMI): 35.1 Temperature(F): 97.8 Respiratory Rate(breaths/min): 18 [1:Photos:] [N/[name]:N/[name]] Right, Medial Lower Leg N/[name] N/[name] Wound Location: Gradually Appeared N/[name] N/[name] Wounding Event: Diabetic Wound/Ulcer of the Lower N/[name] N/[name] Primary Etiology: Extremity Coronary Artery Disease, N/[name] N/[name] Comorbid History: Hypertension, Myocardial Infarction, Type II Diabetes 09/29/2021 N/[name] N/[name] Date Acquired: 2 N/[name] N/[name] [name] of Treatment: Open N/[name] N/[name] Wound Status: No N/[name] N/[name] Wound Recurrence: 1x1x0.1 N/[name] N/[name] Measurements L x W x D (cm) 0.785 N/[name] N/[name] [name] (cm) : rea 0.079 N/[name] N/[name] Volume (cm) : 0.00% N/[name] N/[name] % Reduction in [name] rea: 0.00%  N/[name] N/[name] % Reduction in Volume: Grade 1 N/[name] N/[name] Classification: Medium N/[name] N/[name] Exudate [name] mount: Serosanguineous N/[name] N/[name] Exudate Type: red, brown N/[name] N/[name] Exudate Color: Medium (34-66%) N/[name] N/[name] Granulation [name] mount: Red, Pink N/[name] N/[name] Granulation Quality: Medium (34-66%) N/[name] N/[name] Necrotic [name] mount: Fat Layer (Subcutaneous Tissue): Yes N/[name] N/[name] Exposed Structures: Fascia: No Tendon: No Muscle: No Joint: No Bone: No None N/[name] N/[name] Epithelialization: Treatment Notes Electronic Signature(s) Signed: 04/30/2022 1:35:31 PM By: [name] Entered By: [name] on 04/28/2022 08:31:43 [place], Gerda Diss ([id number].pdf Page 5 of 9 -------------------------------------------------------------------------------- Multi-Disciplinary  Care Plan Details Patient Name: Date of Service: [name], [name]. 04/28/2022 8:15 [name] Medical Record Number: [id number] Patient Account Number: [id number] Date of Birth/Sex: Treating RN: 1946/02/07 (77 y.o. [name] Primary Care Melvine Julin: [name], [name] Other Clinician: Massie [name] Referring Renel Ende: Treating Davita Sublett/Extender: [name], [name] in Treatment: 2 Active Inactive Abuse / Safety / Falls / Self Care Management Nursing Diagnoses: Potential for injury related to abuse or neglect Goals: Patient will not experience any injury related to falls Date Initiated: 04/14/2022 Target Resolution Date: 05/13/2022 Goal Status: Active Interventions: Assess Activities of Daily Living upon admission and as needed Assess fall risk on admission and as needed Assess: immobility, friction, shearing, incontinence upon admission and as needed Assess impairment of mobility on admission and as needed per policy Assess personal safety and home safety (as indicated) on admission and as needed Assess self care needs on admission and as needed Notes: Necrotic Tissue Nursing Diagnoses: Knowledge deficit  related to management of necrotic/devitalized tissue Goals: Necrotic/devitalized tissue will be minimized in the wound bed Date Initiated: 04/14/2022 Target Resolution Date: 05/13/2022 Goal Status: Active Interventions: Assess patient pain level pre-, during and post procedure and prior to discharge Provide education on necrotic tissue and debridement process Notes: Wound/Skin Impairment Nursing Diagnoses: Knowledge deficit related to ulceration/compromised skin integrity Goals: Patient/caregiver will verbalize understanding of skin care regimen Date Initiated: 04/14/2022 Target Resolution Date: 05/13/2022 Goal Status: Active Ulcer/skin breakdown will have [name] volume reduction of 30% by week 4 Date Initiated: 04/14/2022 Target Resolution Date: 05/13/2022 Goal Status: Active Ulcer/skin breakdown will have [name] volume reduction of 50% by week 8 Date Initiated: 04/14/2022 Target Resolution Date: 06/13/2022 Goal Status: Active [name], [name] ([id number]) [id number] Page 6 of 9 Ulcer/skin breakdown will have [name] volume reduction of 80% by week 12 Date Initiated: 04/14/2022 Target Resolution Date: 07/13/2022 Goal Status: Active Ulcer/skin breakdown will heal within 14 [name] Date Initiated: 04/14/2022 Target Resolution Date: 08/13/2022 Goal Status: Active Interventions: Assess patient/caregiver ability to obtain necessary supplies Assess patient/caregiver ability to perform ulcer/skin care regimen upon admission and as needed Assess ulceration(s) every visit Notes: Electronic Signature(s) Signed: 04/29/2022 6:12:13 PM By: Gretta Cool, BSN, RN, CWS, Kim RN, BSN Signed: 04/30/2022 1:35:31 PM By: [name] Entered By: [name] on 04/28/2022 08:54:13 -------------------------------------------------------------------------------- Pain Assessment Details Patient Name: Date of Service: [name]. 04/28/2022 8:15 [name] Medical Record Number: [id number] Patient Account  Number: [id number] Date of Birth/Sex: Treating RN: 1945/12/19 (77 y.o. [name] Primary Care Zephan Beauchaine: [name], [facility] Other Clinician: Massie [name] Referring Umar Patmon: Treating Kacia Halley/Extender: [name], [name] in Treatment: 2 Active Problems Location of Pain Severity and Description of Pain Patient Has Paino Yes Site Locations Pain Location: Pain in Ulcers Duration of the Pain. Constant / Intermittento Constant Rate the pain. Current Pain Level: 4 Worst Pain Level: 6 Least Pain Level: 1 Character of Pain Describe the Pain: Stabbing Pain Management and Medication Current Pain Management: Medication: No Cold Application: No Rest: Yes Massage: No Activity: No T.E.N.S.: No Heat Application: No Leg drop or elevation: No Is the Current Pain Management Adequate: Inadequate [name], [name] ([id number]) [phone].pdf Page 7 of 9 How does your wound impact your activities of daily livingo Sleep: No Bathing: No Appetite: No Relationship With Others: No Bladder Continence: No Emotions: No Bowel Continence: No Work: No Toileting: No Drive: No Dressing: No Hobbies: No Engineer, maintenance) Signed: 04/29/2022 6:12:13 PM By: Gretta Cool, BSN, RN, CWS, Kim RN,  BSN Signed: 04/30/2022 1:35:31 PM By: [name] Entered By: [name] on 04/28/2022 08:28:14 -------------------------------------------------------------------------------- Patient/Caregiver Education Details Patient Name: Date of Service: [name]. 2/28/2024andnbsp8:15 [name] Medical Record Number: [id number] Patient Account Number: [id number] Date of Birth/Gender: Treating RN: 16-Dec-1945 (77 y.o. [name] Primary Care Physician: [name], [name] Other Clinician: Massie [name] Referring Physician: Treating Physician/Extender: [name], [name] in Treatment: 2 Education Assessment Education Provided To: Patient Education  Topics Provided Wound/Skin Impairment: Handouts: Other: continue wound care as directed Methods: Explain/Verbal Responses: State content correctly Electronic Signature(s) Signed: 04/30/2022 1:35:31 PM By: [name] Entered By: [name] on 04/28/2022 08:54:32 -------------------------------------------------------------------------------- Wound Assessment Details Patient Name: Date of Service: [name]. 04/28/2022 8:15 [name] Medical Record Number: [id number] Patient Account Number: [id number] Date of Birth/Sex: Treating RN: 12/06/1945 (77 y.o. [name] Primary Care Jaileen Janelle: [name], [facility] Other Clinician: Brydon, Wenzinger ([id number]) [id number] Page 8 of 9 Referring Toyia Jelinek: Treating Sheneika Walstad/Extender: [name] [name] in Treatment: 2 Wound Status Wound Number: 1 Primary Diabetic Wound/Ulcer of the Lower Extremity Etiology: Wound Location: Right, Medial Lower Leg Wound Open Wounding Event: Gradually Appeared Status: Date Acquired: 09/29/2021 Comorbid Coronary Artery Disease, Hypertension, Myocardial Infarction, [name] Of Treatment: 2 History: Type II Diabetes Clustered Wound: No Photos Wound Measurements Length: (cm) 1 Width: (cm) 1 Depth: (cm) 0.1 Area: (cm) 0.785 Volume: (cm) 0.079 % Reduction in Area: 0% % Reduction in Volume: 0% Epithelialization: None Wound Description Classification: Grade 1 Exudate Amount: Medium Exudate Type: Serosanguineous Exudate Color: red, brown Foul Odor After Cleansing: No Slough/Fibrino Yes Wound Bed Granulation Amount: Medium (34-66%) Exposed Structure Granulation Quality: Red, Pink Fascia Exposed: No Necrotic Amount: Medium (34-66%) Fat Layer (Subcutaneous Tissue) Exposed: Yes Necrotic Quality: Adherent Slough Tendon Exposed: No Muscle Exposed: No Joint Exposed: No Bone Exposed: No Treatment Notes Wound #1 (Lower Leg) Wound Laterality:  Right, Medial Cleanser Soap and Water Discharge Instruction: Gently cleanse wound with antibacterial soap, rinse and pat dry prior to dressing wounds Wound Cleanser Discharge Instruction: Wash your hands with soap and water. Remove old dressing, discard into plastic bag and place into trash. Cleanse the wound with Wound Cleanser prior to applying [name] clean dressing using gauze sponges, not tissues or cotton balls. Do not scrub or use excessive force. Pat dry using gauze sponges, not tissue or cotton balls. Peri-Wound Care Topical Activon Honey Gel, 25 (g) Tube Primary Dressing Hydrofera Blue Ready Transfer Foam, 2.5x2.5 (in/in) Discharge Instruction: Apply Hydrofera Blue Ready to wound bed as directed Secondary Dressing ABD Pad 5x9 (in/in) Discharge Instruction: Cover with ABD pad Secured With Tubigrip Size D, 3x10 (in/yd) [name], [name] ([id number]) [phone].pdf Page 9 of 9 Compression Wrap Compression Stockings Add-Ons Electronic Signature(s) Signed: 04/29/2022 6:12:13 PM By: Gretta Cool, BSN, RN, CWS, Kim RN, BSN Signed: 04/30/2022 1:35:31 PM By: [name] Entered By: [name] on 04/28/2022 08:28:48 -------------------------------------------------------------------------------- Vitals Details Patient Name: Date of Service: [name]. 04/28/2022 8:15 [name] Medical Record Number: [id number] Patient Account Number: [id number] Date of Birth/Sex: Treating RN: 03-22-1945 (77 y.o. [name] Primary Care Monique Gift: [name], [name] Other Clinician: Massie [name] Referring Forbes Loll: Treating Kyilee Gregg/Extender: [name] [name] in Treatment: 2 Vital Signs Time Taken: 08:18 Temperature (F): 97.8 Height (in): 67 Pulse (bpm): 71 Weight (lbs): 224 Respiratory Rate (breaths/min): 18 Body Mass Index (BMI): 35.1 Blood Pressure (mmHg): 106/68 Reference Range: 80 - 120 mg / dl Electronic Signature(s)  Signed: 04/30/2022 1:35:31 PM  By: [name] Entered By: [name] on 04/28/2022 08:21:19

## 2022-05-03 NOTE — Progress Notes (Signed)
LYNNOX, BOYKO [name] ([id number]) [id number] Page 1 of 8 Visit Report for 04/28/2022 Chief Complaint Document Details Patient Name: Date of Service: [name], [name]. 04/28/2022 8:15 [name] Medical Record Number: [id number] Patient Account Number: [id number] Date of Birth/Sex: Treating RN: October 30, 1945 (77 y.o. [name] Primary Care Provider: Serina [name], [facility] Other Clinician: Massie [name] Referring Provider: Treating Provider/Extender: [name], [name] Weeks in Treatment: 2 Information Obtained from: Patient Chief Complaint 04/14/2022; right lower extremity wound Electronic Signature(s) Signed: 04/28/2022 8:49:51 AM By: [name] Shan DO Entered By: [name] on 04/28/2022 08:44:50 -------------------------------------------------------------------------------- Debridement Details Patient Name: Date of Service: [name]. 04/28/2022 8:15 [name] Medical Record Number: [id number] Patient Account Number: [id number] Date of Birth/Sex: Treating RN: [birth year] (77 y.o. [name] Primary Care Provider: Serina [name], [facility] Other Clinician: Massie [name] Referring Provider: Treating Provider/Extender: [name], [name] Weeks in Treatment: 2 Debridement Performed for Assessment: Wound #1 Right,Medial Lower Leg Performed By: Physician [name] Shan, MD Debridement Type: Debridement Severity of Tissue Pre Debridement: Fat layer exposed Level of Consciousness (Pre-procedure): Awake and Alert Pre-procedure Verification/Time Out Yes - 08:36 Taken: Start Time: 08:36 T Area Debrided (L x W): otal 1 (cm) x 1 (cm) = 1 (cm) Tissue and other material debrided: Viable, Non-Viable, Slough, Subcutaneous, Slough Level: Skin/Subcutaneous Tissue Debridement Description: Excisional Instrument: Curette Bleeding: Minimum Hemostasis Achieved: Pressure Response to Treatment: Procedure was tolerated well Level of Consciousness (Post-  Awake and Alert procedure): [name], [name] ([id number]) [id number] Page 2 of 8 Post Debridement Measurements of Total Wound Length: (cm) 1 Width: (cm) 1 Depth: (cm) 0.1 Volume: (cm) 0.079 Character of Wound/Ulcer Post Debridement: Stable Severity of Tissue Post Debridement: Fat layer exposed Post Procedure Diagnosis Same as Pre-procedure Electronic Signature(s) Signed: 04/28/2022 8:49:51 AM By: [name] Shan DO Signed: 04/29/2022 6:12:13 PM By: [name], BSN, RN, CWS, Kim RN, BSN Signed: 04/30/2022 1:35:31 PM By: [name] Entered By: [name] on 04/28/2022 08:37:16 -------------------------------------------------------------------------------- HPI Details Patient Name: Date of Service: [name]. 04/28/2022 8:15 [name] Medical Record Number: [id number] Patient Account Number: [id number] Date of Birth/Sex: Treating RN: 1946-01-30 (77 y.o. [name] Primary Care Provider: Serina [name], [facility] Other Clinician: Massie [name] Referring Provider: Treating Provider/Extender: [name], [name] Weeks in Treatment: 2 History of Present Illness HPI Description: 04/14/2022 [name] is [name] with [name] past medical history of diet-controlled type 2 diabetes, OSA, venous insufficiency and CAD that presents to the clinic for [name] to the right lower extremity. He states the wound started spontaneously and he has been using Neosporin to the area. He wears compression stockings daily. He currently denies signs of infection. He denies pain to the site. The wound has remained stable over the past 6 months. ABIs in office were 1.08 on the right. 2/21; patient presents for follow-up. He did not tolerate the compression wrap well. He states it became too tight at the top and he did cut into it to relieve some of the pressure. He denies signs of infection. 2/28; patient presents for follow-up. He has  been using Medihoney and Hydrofera Blue to the wound bed under Tubigrip. He has no issues or complaints today. Electronic Signature(s) Signed: 04/28/2022 8:49:51 AM By: [name] Shan DO Entered By: [name] on 04/28/2022 08:45:12 -------------------------------------------------------------------------------- Physical Exam Details Patient Name: Date of Service: [name]. 04/28/2022 8:15 [name] [name], [name] ([id number]) [id number] Page 3  of 8 Medical Record Number: [id number] Patient Account Number: [id number] Date of Birth/Sex: Treating RN: 1945-06-07 (77 y.o. [name] Primary Care Provider: Serina [name], [facility] Other Clinician: Massie [name] Referring Provider: Treating Provider/Extender: [name], [name] Weeks in Treatment: 2 Constitutional . Cardiovascular . Psychiatric . Notes Right lower extremity: 2+ pitting edema to the knee. Circular open wound to the anterior aspect with nonviable tissue and granulated tissue. No signs of surrounding infection including increased warmth, erythema or purulent drainage. Venous stasis dermatitis noted throughout the leg. Varicose veins around the ankle. Electronic Signature(s) Signed: 04/28/2022 8:49:51 AM By: [name] Shan DO Entered By: [name] on 04/28/2022 08:45:47 -------------------------------------------------------------------------------- Physician Orders Details Patient Name: Date of Service: [name]. 04/28/2022 8:15 [name] Medical Record Number: [id number] Patient Account Number: [id number] Date of Birth/Sex: Treating RN: January 10, 1946 (77 y.o. [name] Primary Care Provider: Serina [name], [facility] Other Clinician: Massie [name] Referring Provider: Treating Provider/Extender: [name], [name] Weeks in Treatment: 2 Verbal / Phone Orders: No Diagnosis Coding Follow-up Appointments Return Appointment in 1 week. Bathing/ Shower/  Hygiene May shower with wound dressing protected with water repellent cover or cast protector. Anesthetic (Use 'Patient Medications' Section for Anesthetic Order Entry) Lidocaine applied to wound bed Edema Control - Lymphedema / Segmental Compressive Device / Other Optional: One layer of unna paste to top of compression wrap (to act as an anchor). Elevate, Exercise Daily and [name] void Standing for Long Periods of Time. Elevate legs to the level of the heart and pump ankles as often as possible Elevate leg(s) parallel to the floor when sitting. Wound Treatment Wound #1 - Lower Leg Wound Laterality: Right, Medial Cleanser: Soap and Water Every Other Day/30 Days Discharge Instructions: Gently cleanse wound with antibacterial soap, rinse and pat dry prior to dressing wounds Cleanser: Wound Cleanser Every Other Day/30 Days Discharge Instructions: Wash your hands with soap and water. Remove old dressing, discard into plastic bag and place into trash. Cleanse the wound with Wound Cleanser prior to applying [name] clean dressing using gauze sponges, not tissues or cotton balls. Do not scrub or use excessive force. Pat dry using gauze sponges, not tissue or cotton balls. Topical: Activon Honey Gel, 25 (g) Tube Every Other Day/30 Days [name], [name] ([id number]) [id number] Page 4 of 8 Prim Dressing: Hydrofera Blue Ready Transfer Foam, 2.5x2.5 (in/in) (DME) (Dispense As Written) Every Other Day/30 Days ary Discharge Instructions: Apply Hydrofera Blue Ready to wound bed as directed Secondary Dressing: ABD Pad 5x9 (in/in) (DME) (Generic) Every Other Day/30 Days Discharge Instructions: Cover with ABD pad Secured With: Tubigrip Size D, 3x10 (in/yd) Every Other Day/30 Days Electronic Signature(s) Signed: 04/28/2022 8:49:51 AM By: [name] Shan DO Entered By: [name] on 04/28/2022  08:49:26 -------------------------------------------------------------------------------- Problem List Details Patient Name: Date of Service: [name]. 04/28/2022 8:15 [name] Medical Record Number: [id number] Patient Account Number: [id number] Date of Birth/Sex: Treating RN: 12-16-1945 (77 y.o. [name] Primary Care Provider: Serina [name], [name] Other Clinician: Massie [name] Referring Provider: Treating Provider/Extender: [name], [name] Weeks in Treatment: 2 Active Problems ICD-10 Encounter Code Description Active Date MDM Diagnosis L97.812 Non-pressure chronic ulcer of other part of right lower leg with fat layer 04/14/2022 No Yes exposed I87.311 Chronic venous hypertension (idiopathic) with ulcer of right lower extremity 04/14/2022 No Yes E11.622 Type 2 diabetes mellitus with other skin ulcer 04/14/2022 No Yes G47.33 Obstructive sleep apnea (adult) (pediatric) 04/14/2022 No Yes Inactive Problems Resolved Problems  Electronic Signature(s) Signed: 04/28/2022 8:49:51 AM By: [name] Shan DO Entered By: [name] on 04/28/2022 08:44:47 Daughtry, Gerda Diss ([id number]) [id number] Page 5 of 8 -------------------------------------------------------------------------------- Progress Note Details Patient Name: Date of Service: [name], [name]. 04/28/2022 8:15 [name] Medical Record Number: [id number] Patient Account Number: [id number] Date of Birth/Sex: Treating RN: 15-Nov-1945 (77 y.o. [name] Primary Care Provider: Serina [name], [facility] Other Clinician: Massie [name] Referring Provider: Treating Provider/Extender: [name], [name] Weeks in Treatment: 2 Subjective Chief Complaint Information obtained from Patient 04/14/2022; right lower extremity wound History of Present Illness (HPI) 04/14/2022 Mr. Von Lownes is [name] with [name] past medical history of diet-controlled type 2 diabetes, OSA, venous  insufficiency and CAD that presents to the clinic for [name] to the right lower extremity. He states the wound started spontaneously and he has been using Neosporin to the area. He wears compression stockings daily. He currently denies signs of infection. He denies pain to the site. The wound has remained stable over the past 6 months. ABIs in office were 1.08 on the right. 2/21; patient presents for follow-up. He did not tolerate the compression wrap well. He states it became too tight at the top and he did cut into it to relieve some of the pressure. He denies signs of infection. 2/28; patient presents for follow-up. He has been using Medihoney and Hydrofera Blue to the wound bed under Tubigrip. He has no issues or complaints today. Objective Constitutional Vitals Time Taken: 8:18 AM, Height: 67 in, Weight: 224 lbs, BMI: 35.1, Temperature: 97.8 F, Pulse: 71 bpm, Respiratory Rate: 18 breaths/min, Blood Pressure: 106/68 mmHg. General Notes: Right lower extremity: 2+ pitting edema to the knee. Circular open wound to the anterior aspect with nonviable tissue and granulated tissue. No signs of surrounding infection including increased warmth, erythema or purulent drainage. Venous stasis dermatitis noted throughout the leg. Varicose veins around the ankle. Integumentary (Hair, Skin) Wound #1 status is Open. Original cause of wound was Gradually Appeared. The date acquired was: 09/29/2021. The wound has been in treatment 2 weeks. The wound is located on the Right,Medial Lower Leg. The wound measures 1cm length x 1cm width x 0.1cm depth; 0.785cm^2 area and 0.079cm^3 volume. There is Fat Layer (Subcutaneous Tissue) exposed. There is [name] medium amount of serosanguineous drainage noted. There is medium (34-66%) red, pink granulation within the wound bed. There is [name] medium (34-66%) amount of necrotic tissue within the wound bed including Adherent Slough. 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 I debrided nonviable tissue. I recommended continuing Hydrofera Blue and Medihoney under compression stocking. Elevate legs when sitting. Follow-up in 2 weeks. Procedures Wound #1 Pre-procedure diagnosis of Wound #1 is [name] Diabetic Wound/Ulcer of the Lower Extremity located on the Right,Medial Lower Leg .Severity of Tissue Pre Debridement is: Fat layer exposed. There was [name] Excisional Skin/Subcutaneous Tissue Debridement with [name] total area of 1 sq cm performed by [name] Shan, MD. With the following instrument(s): Curette to remove Viable and Non-Viable tissue/material. Material removed includes Subcutaneous Tissue and Slough and. [name] time out was conducted at 08:36, prior to the start of the procedure. [name] Minimum amount of bleeding was controlled  with Pressure. The procedure was tolerated well. Post Debridement Measurements: 1cm length x 1cm width x 0.1cm depth; 0.079cm^3 volume. Character of Wound/Ulcer Post Debridement is stable. Severity of Tissue Post Debridement is: Fat layer exposed. Post procedure Diagnosis Wound #1: Same as Pre-Procedure Plan Follow-up Appointments: Return Appointment in 1 week. Bathing/ Shower/ Hygiene: May shower with wound dressing protected with water repellent cover or cast protector. Anesthetic (Use 'Patient Medications' Section for Anesthetic Order Entry): Lidocaine applied to wound bed Edema Control - Lymphedema / Segmental Compressive Device / Other: Optional: One layer of unna paste to top of compression wrap (to act as an anchor). Elevate, Exercise Daily and Avoid Standing for  Long Periods of Time. Elevate legs to the level of the heart and pump ankles as often as possible Elevate leg(s) parallel to the floor when sitting. WOUND #1: - Lower Leg Wound Laterality: Right, Medial Cleanser: Soap and Water Every Other Day/30 Days Discharge Instructions: Gently cleanse wound with antibacterial soap, rinse and pat dry prior to dressing wounds Cleanser: Wound Cleanser Every Other Day/30 Days Discharge Instructions: Wash your hands with soap and water. Remove old dressing, discard into plastic bag and place into trash. Cleanse the wound with Wound Cleanser prior to applying [name] clean dressing using gauze sponges, not tissues or cotton balls. Do not scrub or use excessive force. Pat dry using gauze sponges, not tissue or cotton balls. Topical: Activon Honey Gel, 25 (g) Tube Every Other Day/30 Days Prim Dressing: Hydrofera Blue Ready Transfer Foam, 2.5x2.5 (in/in) (DME) (Dispense As Written) Every Other Day/30 Days ary Discharge Instructions: Apply Hydrofera Blue Ready to wound bed as directed Secondary Dressing: ABD Pad 5x9 (in/in) (DME) (Generic) Every Other Day/30 Days Discharge Instructions: Cover with ABD pad Secured With: Tubigrip Size D, 3x10 (in/yd) Every Other Day/30 Days 1. In office sharp debridement 2. Hydrofera Blue and Medihoney 3. Compression stocking daily 4. Follow-up in 1 week Electronic Signature(s) Signed: 04/28/2022 8:49:51 AM By: [name] Shan DO Entered By: [name] on 04/28/2022 08:49:04 -------------------------------------------------------------------------------- ROS/PFSH Details Patient Name: Date of Service: [name]. 04/28/2022 8:15 [name] Medical Record Number: [id number] Patient Account Number: [id number] Date of Birth/Sex: Treating RN: 1945/05/05 (77 y.o. [name] Primary Care Provider: Serina [name], [facility] Other Clinician: Massie [name] Referring Provider: Treating Provider/Extender: [name],  [name] Weeks in Treatment: [address] JOSTYN, GARVERICK [name] ([id number]) [id number] Page 7 of 8 Information Obtained From Patient Cardiovascular Medical History: Positive for: Coronary Artery Disease; Hypertension; Myocardial Infarction Endocrine Medical History: Positive for: Type II Diabetes Time with diabetes: 5 Immunizations Pneumococcal Vaccine: Received Pneumococcal Vaccination: Yes Received Pneumococcal Vaccination On or After 60th Birthday: Yes Implantable Devices None Family and Social History Former smoker; Marital Status - Married; Alcohol Use: Never; Drug Use: No History; Caffeine Use: Never Electronic Signature(s) Signed: 04/28/2022 8:49:51 AM By: [name] Shan DO Signed: 04/29/2022 6:12:13 PM By: [name], BSN, RN, CWS, Kim RN, BSN Entered By: [name] on 04/28/2022 08:49:35 -------------------------------------------------------------------------------- SuperBill Details Patient Name: Date of Service: [name]. 04/28/2022 Medical Record Number: [id number] Patient Account Number: [id number] Date of Birth/Sex: Treating RN: 1945/09/12 (77 y.o. [name] Primary Care Provider: Serina [name], [facility] Other Clinician: Massie [name] Referring Provider: Treating Provider/Extender: [name], [name] Weeks in Treatment: 2 Diagnosis Coding ICD-10 Codes Code Description [phone] Non-pressure chronic ulcer of other part of right lower leg with fat layer exposed I87.311 Chronic venous hypertension (idiopathic) with ulcer of right lower extremity E11.622 Type 2 diabetes mellitus  with other skin ulcer G47.33 Obstructive sleep apnea (adult) (pediatric) Facility Procedures Physician Procedures : CPT4 Code Description Modifier [id number] - WC PHYS SUBQ TISS 20 SQ CM ICD-10 Diagnosis Description [id number] Non-pressure chronic ulcer of other part of right lower leg with fat layer exposed I87.311 Chronic venous hypertension (idiopathic) with   ulcer of right lower extremity E11.622 Type 2 diabetes mellitus with other skin ulcer Quantity: 1 Electronic Signature(s) Signed: 04/28/2022 8:49:51 AM By: [name] Shan DO Entered By: [name] on 04/28/2022 08:49:15

## 2022-05-04 ENCOUNTER — Ambulatory Visit: Payer: Medicare HMO

## 2022-05-06 ENCOUNTER — Ambulatory Visit: Payer: Medicare HMO

## 2022-05-10 ENCOUNTER — Encounter: Payer: Medicare HMO | Attending: Internal Medicine | Admitting: Internal Medicine

## 2022-05-10 DIAGNOSIS — I872 Venous insufficiency (chronic) (peripheral): Secondary | ICD-10-CM | POA: Insufficient documentation

## 2022-05-10 DIAGNOSIS — L97812 Non-pressure chronic ulcer of other part of right lower leg with fat layer exposed: Secondary | ICD-10-CM | POA: Diagnosis not present

## 2022-05-10 DIAGNOSIS — I2510 Atherosclerotic heart disease of native coronary artery without angina pectoris: Secondary | ICD-10-CM | POA: Diagnosis not present

## 2022-05-10 DIAGNOSIS — E11622 Type 2 diabetes mellitus with other skin ulcer: Secondary | ICD-10-CM | POA: Diagnosis not present

## 2022-05-10 DIAGNOSIS — I87311 Chronic venous hypertension (idiopathic) with ulcer of right lower extremity: Secondary | ICD-10-CM | POA: Insufficient documentation

## 2022-05-10 DIAGNOSIS — G4733 Obstructive sleep apnea (adult) (pediatric): Secondary | ICD-10-CM | POA: Insufficient documentation

## 2022-05-11 ENCOUNTER — Ambulatory Visit: Payer: Medicare HMO

## 2022-05-11 NOTE — Progress Notes (Signed)
NICKLOS, COBBS A ([id number]) [id number] Page 1 of 7 Visit Report for 05/10/2022 Chief Complaint Document Details Patient Name: Date of Service: [name], [name] MES A. 05/10/2022 10:15 A M Medical Record Number: [id number] Patient Account Number: [id number] Date of Birth/Sex: Treating RN: 18-Feb-1946 (77 y.o. [name] Primary Care Provider: Serina Cowper, [facility] Other Clinician: Massie Kluver Referring Provider: Treating Provider/Extender: Wendall Papa, EMILY Weeks in Treatment: 3 Information Obtained from: Patient Chief Complaint 04/14/2022; right lower extremity wound Electronic Signature(s) Signed: 05/10/2022 1:05:58 PM By: Kalman Shan DO Entered By: Kalman Shan on 05/10/2022 11:56:45 -------------------------------------------------------------------------------- Debridement Details Patient Name: Date of Service: [name] MES A. 05/10/2022 10:15 A M Medical Record Number: [id number] Patient Account Number: [id number] Date of Birth/Sex: Treating RN: 1945/06/24 (77 y.o. [name] Primary Care Provider: Serina Cowper, [facility] Other Clinician: Massie Kluver Referring Provider: Treating Provider/Extender: Wendall Papa, EMILY Weeks in Treatment: 3 Debridement Performed for Assessment: Wound #1 Right,Medial Lower Leg Performed By: Physician Kalman Shan, MD Debridement Type: Debridement Severity of Tissue Pre Debridement: Fat layer exposed Level of Consciousness (Pre-procedure): Awake and Alert Pre-procedure Verification/Time Out Yes - 11:20 Taken: Start Time: 11:20 T Area Debrided (L x W): otal 0.6 (cm) x 0.7 (cm) = 0.42 (cm) Tissue and other material debrided: Viable, Non-Viable, Slough, Slough Level: Non-Viable Tissue Debridement Description: Selective/Open Wound Instrument: Curette Bleeding: Minimum Hemostasis Achieved: Pressure Response to Treatment: Procedure was tolerated well Level of Consciousness (Post-  Awake and Alert procedure): SHADOE, HOLOHAN A ([id number]) [id number] Page 2 of 7 Post Debridement Measurements of Total Wound Length: (cm) 0.6 Width: (cm) 0.7 Depth: (cm) 0.1 Volume: (cm) 0.033 Character of Wound/Ulcer Post Debridement: Stable Severity of Tissue Post Debridement: Fat layer exposed Post Procedure Diagnosis Same as Pre-procedure Electronic Signature(s) Signed: 05/10/2022 1:05:58 PM By: Kalman Shan DO Signed: 05/10/2022 4:44:23 PM By: Gretta Cool, BSN, RN, CWS, Kim RN, BSN Signed: 05/11/2022 9:45:50 AM By: Massie Kluver Entered By: Massie Kluver on 05/10/2022 11:22:35 -------------------------------------------------------------------------------- HPI Details Patient Name: Date of Service: [name] MES A. 05/10/2022 10:15 A M Medical Record Number: [id number] Patient Account Number: [id number] Date of Birth/Sex: Treating RN: October 01, 1945 (77 y.o. [name] Primary Care Provider: Serina Cowper, [facility] Other Clinician: Massie Kluver Referring Provider: Treating Provider/Extender: Wendall Papa, EMILY Weeks in Treatment: 3 History of Present Illness HPI Description: 04/14/2022 [name] is a 77 year old male with a past medical history of diet-controlled type 2 diabetes, OSA, venous insufficiency and CAD that presents to the clinic for a 6 month history of nonhealing ulcer to the right lower extremity. He states the wound started spontaneously and he has been using Neosporin to the area. He wears compression stockings daily. He currently denies signs of infection. He denies pain to the site. The wound has remained stable over the past 6 months. ABIs in office were 1.08 on the right. 2/21; patient presents for follow-up. He did not tolerate the compression wrap well. He states it became too tight at the top and he did cut into it to relieve some of the pressure. He denies signs of infection. 2/28; patient presents for follow-up.  He has been using Medihoney and Hydrofera Blue to the wound bed under Tubigrip. He has no issues or complaints today. 3/11; patient presents for follow-up. He has been using Medihoney and Hydrofera Blue to the wound bed under Tubigrip. He has no issues or complaints today. There is been improvement in wound healing. Electronic Signature(s) Signed: 05/10/2022 1:05:58 PM By: [place],  Janett Billow DO Entered By: Kalman Shan on 05/10/2022 11:57:05 Golob, Gerda Diss ([id number]) [id number] Page 3 of 7 -------------------------------------------------------------------------------- Physical Exam Details Patient Name: Date of Service: [name] MES A. 05/10/2022 10:15 A M Medical Record Number: [id number] Patient Account Number: [id number] Date of Birth/Sex: Treating RN: 09/28/1945 (77 y.o. [name] Primary Care Provider: Serina Cowper, [facility] Other Clinician: Massie Kluver Referring Provider: Treating Provider/Extender: Isaac Laud NKLIN, EMILY Weeks in Treatment: 3 Constitutional . Cardiovascular . Psychiatric . Notes Right lower extremity: 2+ pitting edema to the knee. Circular open wound to the anterior aspect with nonviable tissue and granulated tissue. No signs of surrounding infection including increased warmth, erythema or purulent drainage. Venous stasis dermatitis noted throughout the leg. Varicose veins around the ankle. Electronic Signature(s) Signed: 05/10/2022 1:05:58 PM By: Kalman Shan DO Entered By: Kalman Shan on 05/10/2022 11:58:52 -------------------------------------------------------------------------------- Physician Orders Details Patient Name: Date of Service: [name] MES A. 05/10/2022 10:15 A M Medical Record Number: [id number] Patient Account Number: [id number] Date of Birth/Sex: Treating RN: November 02, 1945 (77 y.o. [name] Primary Care Provider: Serina Cowper, [facility] Other Clinician: Massie Kluver Referring  Provider: Treating Provider/Extender: Wendall Papa, EMILY Weeks in Treatment: 3 Verbal / Phone Orders: No Diagnosis Coding Follow-up Appointments Return Appointment in 1 week. Bathing/ Shower/ Hygiene May shower with wound dressing protected with water repellent cover or cast protector. Anesthetic (Use 'Patient Medications' Section for Anesthetic Order Entry) Lidocaine applied to wound bed Edema Control - Lymphedema / Segmental Compressive Device / Other Patient to wear own compression stockings. Remove compression stockings every night before going to bed and put on every morning when getting up. Elevate, Exercise Daily and A void Standing for Long Periods of Time. Elevate legs to the level of the heart and pump ankles as often as possible Elevate leg(s) parallel to the floor when sitting. Wound Treatment Wound #1 - Lower Leg Wound Laterality: Right, Medial Cleanser: Soap and Water Every Other Day/30 Days Discharge Instructions: Gently cleanse wound with antibacterial soap, rinse and pat dry prior to dressing wounds Cleanser: Wound Cleanser Every Other Day/30 Days Discharge Instructions: Wash your hands with soap and water. Remove old dressing, discard into plastic bag and place into trash. Cleanse the EUNICE, SEIM A ([id number]) [id number] Page 4 of 7 wound with Wound Cleanser prior to applying a clean dressing using gauze sponges, not tissues or cotton balls. Do not scrub or use excessive force. Pat dry using gauze sponges, not tissue or cotton balls. Topical: Activon Honey Gel, 25 (g) Tube Every Other Day/30 Days Prim Dressing: Hydrofera Blue Ready Transfer Foam, 2.5x2.5 (in/in) (Dispense As Written) Every Other Day/30 Days ary Discharge Instructions: Apply Hydrofera Blue Ready to wound bed as directed Secondary Dressing: ABD Pad 5x9 (in/in) (Generic) Every Other Day/30 Days Discharge Instructions: Cover with ABD pad Electronic  Signature(s) Signed: 05/11/2022 9:45:50 AM By: Massie Kluver Signed: 05/11/2022 1:27:41 PM By: Kalman Shan DO Previous Signature: 05/10/2022 1:05:58 PM Version By: Kalman Shan DO Entered By: Massie Kluver on 05/10/2022 13:10:25 -------------------------------------------------------------------------------- Problem List Details Patient Name: Date of Service: [name] MES A. 05/10/2022 10:15 A M Medical Record Number: [id number] Patient Account Number: [id number] Date of Birth/Sex: Treating RN: 02-Apr-1945 (77 y.o. [name] Primary Care Provider: Serina Cowper, [facility] Other Clinician: Massie Kluver Referring Provider: Treating Provider/Extender: Wendall Papa, EMILY Weeks in Treatment: 3 Active Problems ICD-10 Encounter Code Description Active Date MDM Diagnosis L97.812 Non-pressure chronic ulcer of other part of right lower leg with fat  layer 04/14/2022 No Yes exposed I87.311 Chronic venous hypertension (idiopathic) with ulcer of right lower extremity 04/14/2022 No Yes E11.622 Type 2 diabetes mellitus with other skin ulcer 04/14/2022 No Yes G47.33 Obstructive sleep apnea (adult) (pediatric) 04/14/2022 No Yes Inactive Problems Resolved Problems Electronic Signature(s) Signed: 05/10/2022 1:05:58 PM By: Kalman Shan DO Entered By: Kalman Shan on 05/10/2022 11:56:40 Grenz, Gerda Diss ([id number]) [id number] Page 5 of 7 -------------------------------------------------------------------------------- Progress Note Details Patient Name: Date of Service: ADAL, HANSLEY MES A. 05/10/2022 10:15 A M Medical Record Number: [id number] Patient Account Number: [id number] Date of Birth/Sex: Treating RN: 08-03-1945 (77 y.o. [name] Primary Care Provider: Serina Cowper, [facility] Other Clinician: Massie Kluver Referring Provider: Treating Provider/Extender: Wendall Papa, EMILY Weeks in Treatment: 3 Subjective Chief  Complaint Information obtained from Patient 04/14/2022; right lower extremity wound History of Present Illness (HPI) 04/14/2022 Mr. Issac Wilcott is a 77 year old male with a past medical history of diet-controlled type 2 diabetes, OSA, venous insufficiency and CAD that presents to the clinic for a 6 month history of nonhealing ulcer to the right lower extremity. He states the wound started spontaneously and he has been using Neosporin to the area. He wears compression stockings daily. He currently denies signs of infection. He denies pain to the site. The wound has remained stable over the past 6 months. ABIs in office were 1.08 on the right. 2/21; patient presents for follow-up. He did not tolerate the compression wrap well. He states it became too tight at the top and he did cut into it to relieve some of the pressure. He denies signs of infection. 2/28; patient presents for follow-up. He has been using Medihoney and Hydrofera Blue to the wound bed under Tubigrip. He has no issues or complaints today. 3/11; patient presents for follow-up. He has been using Medihoney and Hydrofera Blue to the wound bed under Tubigrip. He has no issues or complaints today. There is been improvement in wound healing. Objective Constitutional Vitals Time Taken: 10:28 AM, Height: 67 in, Weight: 224 lbs, BMI: 35.1, Temperature: 97.7 F, Pulse: 64 bpm, Respiratory Rate: 18 breaths/min, Blood Pressure: 126/77 mmHg. General Notes: Right lower extremity: 2+ pitting edema to the knee. Circular open wound to the anterior aspect with nonviable tissue and granulated tissue. No signs of surrounding infection including increased warmth, erythema or purulent drainage. Venous stasis dermatitis noted throughout the leg. Varicose veins around the ankle. Integumentary (Hair, Skin) Wound #1 status is Open. Original cause of wound was Gradually Appeared. The date acquired was: 09/29/2021. The wound has been in treatment 3 weeks.  The wound is located on the Right,Medial Lower Leg. The wound measures 0.6cm length x 0.7cm width x 0.1cm depth; 0.33cm^2 area and 0.033cm^3 volume. There is Fat Layer (Subcutaneous Tissue) exposed. There is a medium amount of serosanguineous drainage noted. There is medium (34-66%) red, pink granulation within the wound bed. There is a medium (34-66%) amount of necrotic tissue within the wound bed including Adherent Slough. 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 I debrided nonviable tissue. I recommended continuing the course with Medihoney and Hydrofera Blue. He has been using Tubigrip but he did order compression stockings and he  has them with him today. I recommended he use these daily. Procedures Wound #1 Pre-procedure diagnosis of Wound #1 is a Diabetic Wound/Ulcer of the Lower Extremity located on the Right,Medial Lower Leg .Severity of Tissue Pre Debridement is: Fat layer exposed. There was a Selective/Open Wound Non-Viable Tissue Debridement with a total area of 0.42 sq cm performed by Kalman Shan, MD. With the following instrument(s): Curette to remove Viable and Non-Viable tissue/material. Material removed includes [place]. A time out was conducted at 11:20, prior to the start of the procedure. A Minimum amount of bleeding was controlled with Pressure. The procedure was tolerated well. Post Debridement Measurements: 0.6cm length x 0.7cm width x 0.1cm depth; 0.033cm^3 volume. Character of Wound/Ulcer Post Debridement is stable. Severity of Tissue Post Debridement is: Fat layer  exposed. Post procedure Diagnosis Wound #1: Same as Pre-Procedure Plan Follow-up Appointments: Return Appointment in 1 week. Bathing/ Shower/ Hygiene: May shower with wound dressing protected with water repellent cover or cast protector. Anesthetic (Use 'Patient Medications' Section for Anesthetic Order Entry): Lidocaine applied to wound bed Edema Control - Lymphedema / Segmental Compressive Device / Other: Optional: One layer of unna paste to top of compression wrap (to act as an anchor). Elevate, Exercise Daily and Avoid Standing for Long Periods of Time. Elevate legs to the level of the heart and pump ankles as often as possible Elevate leg(s) parallel to the floor when sitting. WOUND #1: - Lower Leg Wound Laterality: Right, Medial Cleanser: Soap and Water Every Other Day/30 Days Discharge Instructions: Gently cleanse wound with antibacterial soap, rinse and pat dry prior to dressing wounds Cleanser: Wound Cleanser Every Other Day/30 Days Discharge Instructions: Wash your hands with soap and water. Remove old dressing, discard into plastic bag and place into trash. Cleanse the wound with Wound Cleanser prior to applying a clean dressing using gauze sponges, not tissues or cotton balls. Do not scrub or use excessive force. Pat dry using gauze sponges, not tissue or cotton balls. Topical: Activon Honey Gel, 25 (g) Tube Every Other Day/30 Days Prim Dressing: Hydrofera Blue Ready Transfer Foam, 2.5x2.5 (in/in) (Dispense As Written) Every Other Day/30 Days ary Discharge Instructions: Apply Hydrofera Blue Ready to wound bed as directed Secondary Dressing: ABD Pad 5x9 (in/in) (Generic) Every Other Day/30 Days Discharge Instructions: Cover with ABD pad Secured With: Tubigrip Size D, 3x10 (in/yd) Every Other Day/30 Days 1. Medihoney and Hydrofera Blue 2. Compression stockings daily 3. Follow-up in 1 week Electronic Signature(s) Signed: 05/10/2022 1:05:58 PM By: Kalman Shan DO Entered  By: Kalman Shan on 05/10/2022 11:59:41 -------------------------------------------------------------------------------- SuperBill Details Patient Name: Date of Service: [name] MES A. 05/10/2022 Medical Record Number: [id number] Patient Account Number: [id number] Date of Birth/Sex: Treating RN: June 10, 1945 (77 y.o. [name] Primary Care Provider: Serina Cowper, [facility] Other Clinician: Massie Kluver Referring Provider: Treating Provider/Extender: Wendall Papa, EMILY MALYK, [place] ([id number]) [id number] Page 7 of 7 Weeks in Treatment: 3 Diagnosis Coding ICD-10 Codes Code Description [phone] Non-pressure chronic ulcer of other part of right lower leg with fat layer exposed I87.311 Chronic venous hypertension (idiopathic) with ulcer of right lower extremity E11.622 Type 2 diabetes mellitus with other skin ulcer G47.33 Obstructive sleep apnea (adult) (pediatric) Facility Procedures : 7 CPT4 Code: [id number] Description: [id number] - DEBRIDE WOUND 1ST 20 SQ CM OR < ICD-10 Diagnosis Description L97.812 Non-pressure chronic ulcer of other part of right lower leg with fat layer expos I87.311 Chronic venous hypertension (idiopathic) with ulcer of right lower  extremity E11.622 Type 2  diabetes mellitus with other skin ulcer Modifier: ed Quantity: 1 Physician Procedures : CPT4 Code Description Modifier [id number] - WC PHYS DEBR WO ANESTH 20 SQ CM ICD-10 Diagnosis Description [id number] Non-pressure chronic ulcer of other part of right lower leg with fat layer exposed I87.311 Chronic venous hypertension (idiopathic) with  ulcer of right lower extremity E11.622 Type 2 diabetes mellitus with other skin ulcer Quantity: 1 Electronic Signature(s) Signed: 05/10/2022 1:05:58 PM By: Kalman Shan DO Entered By: Kalman Shan on 05/10/2022 11:59:50
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
## 2022-05-12 ENCOUNTER — Ambulatory Visit: Payer: Medicare HMO | Admitting: Internal Medicine

## 2022-05-13 ENCOUNTER — Ambulatory Visit: Payer: Medicare HMO

## 2022-05-18 ENCOUNTER — Ambulatory Visit: Payer: Medicare HMO

## 2022-05-19 ENCOUNTER — Encounter (HOSPITAL_BASED_OUTPATIENT_CLINIC_OR_DEPARTMENT_OTHER): Payer: Medicare HMO | Admitting: Internal Medicine

## 2022-05-19 DIAGNOSIS — E11622 Type 2 diabetes mellitus with other skin ulcer: Secondary | ICD-10-CM

## 2022-05-19 DIAGNOSIS — L97812 Non-pressure chronic ulcer of other part of right lower leg with fat layer exposed: Secondary | ICD-10-CM

## 2022-05-19 DIAGNOSIS — I87311 Chronic venous hypertension (idiopathic) with ulcer of right lower extremity: Secondary | ICD-10-CM

## 2022-05-20 ENCOUNTER — Ambulatory Visit: Payer: Medicare HMO

## 2022-05-20 ENCOUNTER — Encounter: Payer: Self-pay | Admitting: Podiatry

## 2022-05-20 ENCOUNTER — Ambulatory Visit: Payer: Medicare HMO | Admitting: Podiatry

## 2022-05-20 DIAGNOSIS — E1142 Type 2 diabetes mellitus with diabetic polyneuropathy: Secondary | ICD-10-CM | POA: Diagnosis not present

## 2022-05-20 DIAGNOSIS — D689 Coagulation defect, unspecified: Secondary | ICD-10-CM

## 2022-05-20 DIAGNOSIS — M79676 Pain in unspecified toe(s): Secondary | ICD-10-CM | POA: Diagnosis not present

## 2022-05-20 DIAGNOSIS — B351 Tinea unguium: Secondary | ICD-10-CM

## 2022-05-20 NOTE — Progress Notes (Signed)
[name] [name] ([id number]) [id number] Page 1 of 9 Visit Report for 05/19/2022 Arrival Information Details Patient Name: Date of Service: [name] [name]. 05/19/2022 8:15 [name] Medical Record Number: [id number] Patient Account Number: [id number] Date of Birth/Sex: Treating RN: March 15, 1945 (77 y.o. [name] Primary Care Kaelyn Nauta: Serina Cowper, [facility] Other Clinician: Massie Kluver Referring [name]: Treating [name], EMILY Weeks in Treatment: 5 Visit Information History Since Last Visit All ordered tests and consults were completed: No Patient Arrived: [name] Added or deleted any medications: No Arrival Time: 08:15 Any new allergies or adverse reactions: No Transfer Assistance: None Had [name] fall or experienced change in No Patient Identification Verified: Yes activities of daily living that may affect Secondary Verification Process Completed: Yes risk of falls: Patient Requires Transmission-Based Precautions: No Signs or symptoms of abuse/neglect since last visito No Patient Has Alerts: Yes Hospitalized since last visit: No Patient Alerts: Patient on Blood Thinner Implantable device outside of the clinic excluding No Baby ASA 81mg  cellular tissue based products placed in the center since last visit: Has Dressing in Place as Prescribed: Yes Has Compression in Place as Prescribed: Yes Pain Present Now: No Electronic Signature(s) Signed: 05/19/2022 5:18:06 PM By: Massie Kluver Entered By: Massie Kluver on 05/19/2022 08:19:39 -------------------------------------------------------------------------------- Clinic Level of Care Assessment Details Patient Name: Date of Service: [name], [name]. 05/19/2022 8:15 [name] Medical Record Number: [id number] Patient Account Number: [id number] Date of Birth/Sex: Treating RN: Dec 04, 1945 (77 y.o. [name] Primary Care Mujtaba Bollig: Serina Cowper, [facility] Other Clinician: Massie Kluver Referring Mariany Mackintosh: Treating Johanna Stafford/Extender: Isaac Laud NKLIN, EMILY Weeks in Treatment: 5 Clinic Level of Care Assessment Items TOOL 1 Quantity Score []  - 0 Use when EandM and Procedure is performed on INITIAL visit ASSESSMENTS - Nursing Assessment / Reassessment []  - 0 General Physical Exam (combine w/ comprehensive assessment (listed just below) when performed on new pt. evals) [name], [name] ([id number]) [id number] Page 2 of 9 []  - 0 Comprehensive Assessment (HX, ROS, Risk Assessments, Wounds Hx, etc.) ASSESSMENTS - Wound and Skin Assessment / Reassessment []  - 0 Dermatologic / Skin Assessment (not related to wound area) ASSESSMENTS - Ostomy and/or Continence Assessment and Care []  - 0 Incontinence Assessment and Management []  - 0 Ostomy Care Assessment and Management (repouching, etc.) PROCESS - Coordination of Care []  - 0 Simple Patient / Family Education for ongoing care []  - 0 Complex (extensive) Patient / Family Education for ongoing care []  - 0 Staff obtains Programmer, systems, Records, T Results / Process Orders est []  - 0 Staff telephones HHA, Nursing Homes / Clarify orders / etc []  - 0 Routine Transfer to another Facility (non-emergent condition) []  - 0 Routine Hospital Admission (non-emergent condition) []  - 0 New Admissions / Biomedical engineer / Ordering NPWT Apligraf, etc. , []  - 0 Emergency Hospital Admission (emergent condition) PROCESS - Special Needs []  - 0 Pediatric / Minor Patient Management []  - 0 Isolation Patient Management []  - 0 Hearing / Language / Visual special needs []  - 0 Assessment of Community assistance (transportation, D/C planning, etc.) []  - 0 Additional assistance / Altered mentation []  - 0 Support Surface(s) Assessment (bed, cushion, seat, etc.) INTERVENTIONS - Miscellaneous []  - 0 External ear exam []  - 0 Patient Transfer (multiple staff / Civil Service fast streamer / Similar devices) []  -  0 Simple Staple / Suture removal (25 or less) []  - 0 Complex Staple / Suture removal (26 or more) []  - 0 Hypo/Hyperglycemic Management (do not check  if billed separately) []  - 0 Ankle / Brachial Index (ABI) - do not check if billed separately Has the patient been seen at the hospital within the last three years: Yes Total Score: 0 Level Of Care: ____ Electronic Signature(s) Signed: 05/19/2022 5:18:06 PM By: Massie Kluver Entered By: Massie Kluver on 05/19/2022 08:50:55 -------------------------------------------------------------------------------- Encounter Discharge Information Details Patient Name: Date of Service: [name]. 05/19/2022 8:15 [name] Medical Record Number: [id number] Patient Account Number: [id number] Date of Birth/Sex: Treating RN: [birth year] (77 y.o. [name] Primary Care Ameira Alessandrini: Serina Cowper, [facility] Other Clinician: Jamy, Schnitzer ([id number]) [id number] Page 3 of 9 Referring Laneah Luft: Treating Delanie Tirrell/Extender: Isaac Laud NKLIN, EMILY Weeks in Treatment: 5 Encounter Discharge Information Items Post Procedure Vitals Discharge Condition: Stable Temperature (F): 97.9 Ambulatory Status: Walker Pulse (bpm): 62 Discharge Destination: Home Respiratory Rate (breaths/min): 18 Transportation: Private Auto Blood Pressure (mmHg): 143/83 Accompanied By: wife Schedule Follow-up Appointment: Yes Clinical Summary of Care: Electronic Signature(s) Signed: 05/19/2022 5:18:06 PM By: Massie Kluver Entered By: Massie Kluver on 05/19/2022 09:02:12 -------------------------------------------------------------------------------- Lower Extremity Assessment Details Patient Name: Date of Service: [name]. 05/19/2022 8:15 [name] Medical Record Number: [id number] Patient Account Number: [id number] Date of Birth/Sex: Treating RN: 1945-06-14 (77 y.o. [name] Primary Care Hadassah Rana: Serina Cowper, EMILY Other  Clinician: Massie Kluver Referring Johnnie Goynes: Treating Keontay Vora/Extender: Wendall [name], EMILY Weeks in Treatment: 5 Edema Assessment Assessed: [Left: No] [Right: Yes] Edema: [Left: Ye] [Right: s] Calf Left: Right: Point of Measurement: 32 cm From Medial Instep 39 cm Ankle Left: Right: Point of Measurement: 10 cm From Medial Instep 24.6 cm Vascular Assessment Pulses: Dorsalis Pedis Palpable: [Right:Yes Yes] Electronic Signature(s) Signed: 05/19/2022 5:15:10 PM By: Gretta Cool, BSN, RN, CWS, Kim RN, BSN Signed: 05/19/2022 5:18:06 PM By: Massie Kluver Entered By: Massie Kluver on 05/19/2022 08:33:11 Duke, Gerda Diss ([id number]) [id number] Page 4 of 9 -------------------------------------------------------------------------------- Multi Wound Chart Details Patient Name: Date of Service: JAMERE, OSHER [name] [name]. 05/19/2022 8:15 [name] Medical Record Number: [id number] Patient Account Number: [id number] Date of Birth/Sex: Treating RN: [birth year] (77 y.o. [name] Primary Care Tiyah Zelenak: Serina Cowper, EMILY Other Clinician: Massie Kluver Referring Demonta Wombles: Treating Ravin Denardo/Extender: Isaac Laud NKLIN, EMILY Weeks in Treatment: 5 Vital Signs Height(in): 67 Pulse(bpm): 62 Weight(lbs): 224 Blood Pressure(mmHg): 143/83 Body Mass Index(BMI): 35.1 Temperature(F): 97.9 Respiratory Rate(breaths/min): 18 [1:Photos:] [N/[name]:N/[name]] Right, Medial Lower Leg N/[name] N/[name] Wound Location: Gradually Appeared N/[name] N/[name] Wounding Event: Diabetic Wound/Ulcer of the Lower N/[name] N/[name] Primary Etiology: Extremity Coronary Artery Disease, N/[name] N/[name] Comorbid History: Hypertension, Myocardial Infarction, Type II Diabetes 09/29/2021 N/[name] N/[name] Date Acquired: 5 N/[name] N/[name] Weeks of Treatment: Open N/[name] N/[name] Wound Status: No N/[name] N/[name] Wound Recurrence: 1x0.9x0.1 N/[name] N/[name] Measurements L x W x D (cm) 0.707 N/[name] N/[name] [name] (cm) : rea 0.071 N/[name] N/[name] Volume (cm) : 9.90% N/[name] N/[name] %  Reduction in [name] rea: 10.10% N/[name] N/[name] % Reduction in Volume: Grade 1 N/[name] N/[name] Classification: Medium N/[name] N/[name] Exudate [name] mount: Serosanguineous N/[name] N/[name] Exudate Type: red, brown N/[name] N/[name] Exudate Color: Medium (34-66%) N/[name] N/[name] Granulation [name] mount: Red, Pink N/[name] N/[name] Granulation Quality: Medium (34-66%) N/[name] N/[name] Necrotic [name] mount: Fat Layer (Subcutaneous Tissue): Yes N/[name] N/[name] Exposed Structures: Fascia: No Tendon: No Muscle: No Joint: No Bone: No None N/[name] N/[name] Epithelialization: Treatment Notes Electronic Signature(s) Signed: 05/19/2022 5:18:06 PM By: Massie Kluver Entered By: Massie Kluver on 05/19/2022 08:33:24 Streight, Gerda Diss ([id number]) [id number] Page 5 of 9 --------------------------------------------------------------------------------  Multi-Disciplinary Care Plan Details Patient Name: Date of Service: CHARLEE, ARNELL [name] [name]. 05/19/2022 8:15 [name] Medical Record Number: [id number] Patient Account Number: [id number] Date of Birth/Sex: Treating RN: March 24, 1945 (77 y.o. [name] Primary Care Mazzy Santarelli: Serina Cowper, EMILY Other Clinician: Massie Kluver Referring Gailene Youkhana: Treating Danyle Boening/Extender: Isaac Laud NKLIN, EMILY Weeks in Treatment: 5 Active Inactive Abuse / Safety / Falls / Self Care Management Nursing Diagnoses: Potential for injury related to abuse or neglect Goals: Patient will not experience any injury related to falls Date Initiated: 04/14/2022 Target Resolution Date: 05/13/2022 Goal Status: Active Interventions: Assess Activities of Daily Living upon admission and as needed Assess fall risk on admission and as needed Assess: immobility, friction, shearing, incontinence upon admission and as needed Assess impairment of mobility on admission and as needed per policy Assess personal safety and home safety (as indicated) on admission and as needed Assess self care needs on admission and as needed Notes: Necrotic Tissue Nursing  Diagnoses: Knowledge deficit related to management of necrotic/devitalized tissue Goals: Necrotic/devitalized tissue will be minimized in the wound bed Date Initiated: 04/14/2022 Target Resolution Date: 05/13/2022 Goal Status: Active Interventions: Assess patient pain level pre-, during and post procedure and prior to discharge Provide education on necrotic tissue and debridement process Notes: Wound/Skin Impairment Nursing Diagnoses: Knowledge deficit related to ulceration/compromised skin integrity Goals: Patient/caregiver will verbalize understanding of skin care regimen Date Initiated: 04/14/2022 Target Resolution Date: 05/13/2022 Goal Status: Active Ulcer/skin breakdown will have [name] volume reduction of 30% by week 4 Date Initiated: 04/14/2022 Target Resolution Date: 05/13/2022 Goal Status: Active Ulcer/skin breakdown will have [name] volume reduction of 50% by week 8 Date Initiated: 04/14/2022 Target Resolution Date: 06/13/2022 Goal Status: Active [name], [name] ([id number]) [id number] Page 6 of 9 Ulcer/skin breakdown will have [name] volume reduction of 80% by week 12 Date Initiated: 04/14/2022 Target Resolution Date: 07/13/2022 Goal Status: Active Ulcer/skin breakdown will heal within 14 weeks Date Initiated: 04/14/2022 Target Resolution Date: 08/13/2022 Goal Status: Active Interventions: Assess patient/caregiver ability to obtain necessary supplies Assess patient/caregiver ability to perform ulcer/skin care regimen upon admission and as needed Assess ulceration(s) every visit Notes: Electronic Signature(s) Signed: 05/19/2022 5:15:10 PM By: Gretta Cool, BSN, RN, CWS, Kim RN, BSN Signed: 05/19/2022 5:18:06 PM By: Massie Kluver Entered By: Massie Kluver on 05/19/2022 09:01:03 -------------------------------------------------------------------------------- Pain Assessment Details Patient Name: Date of Service: [name]. 05/19/2022 8:15 [name] Medical Record  Number: [id number] Patient Account Number: [id number] Date of Birth/Sex: Treating RN: 1945-10-29 (77 y.o. [name] Primary Care Que Meneely: Serina Cowper, [facility] Other Clinician: Massie Kluver Referring Reeshemah Nazaryan: Treating Denitra Donaghey/Extender: Wendall [name], EMILY Weeks in Treatment: 5 Active Problems Location of Pain Severity and Description of Pain Patient Has Paino No Site Locations Pain Management and Medication Current Pain Management: Electronic Signature(s) Signed: 05/19/2022 5:15:10 PM By: Gretta Cool, BSN, RN, CWS, Kim RN, BSN Signed: 05/19/2022 5:18:06 PM By: Massie Kluver Entered By: Massie Kluver on 05/19/2022 08:22:13 [name], [name] ([id number]) [id number] Page 7 of 9 -------------------------------------------------------------------------------- Patient/Caregiver Education Details Patient Name: Date of Service: [name], [name]. 3/20/2024andnbsp8:15 [name] Medical Record Number: [id number] Patient Account Number: [id number] Date of Birth/Gender: Treating RN: 15-Aug-1945 (77 y.o. [name] Primary Care Physician: Serina Cowper, [facility] Other Clinician: Massie Kluver Referring Physician: Treating Physician/Extender: Wendall [name], EMILY Weeks in Treatment: 5 Education Assessment Education Provided To: Patient Education Topics Provided Wound/Skin Impairment: Handouts: Other: continue wound care as directed Methods: Explain/Verbal Responses: State content correctly Electronic Signature(s) Signed:  05/19/2022 5:18:06 PM By: Massie Kluver Entered By: Massie Kluver on 05/19/2022 09:01:22 -------------------------------------------------------------------------------- Wound Assessment Details Patient Name: Date of Service: [name]. 05/19/2022 8:15 [name] Medical Record Number: [id number] Patient Account Number: [id number] Date of Birth/Sex: Treating RN: [birth year] (77 y.o. [name] Primary Care Arcadia Gorgas: Serina Cowper,  [facility] Other Clinician: Massie Kluver Referring Grayling Schranz: Treating Payten Hobin/Extender: Isaac Laud NKLIN, EMILY Weeks in Treatment: 5 Wound Status Wound Number: 1 Primary Diabetic Wound/Ulcer of the Lower Extremity Etiology: Wound Location: Right, Medial Lower Leg Wound Open Wounding Event: Gradually Appeared Status: Date Acquired: 09/29/2021 Comorbid Coronary Artery Disease, Hypertension, Myocardial Infarction, Weeks Of Treatment: 5 History: Type II Diabetes Clustered Wound: No Photos [name], [name] ([id number]) [id number] Page 8 of 9 Wound Measurements Length: (cm) 1 Width: (cm) 0.9 Depth: (cm) 0.1 Area: (cm) 0.707 Volume: (cm) 0.071 % Reduction in Area: 9.9% % Reduction in Volume: 10.1% Epithelialization: None Wound Description Classification: Grade 1 Exudate Amount: Medium Exudate Type: Serosanguineous Exudate Color: red, brown Foul Odor After Cleansing: No Slough/Fibrino Yes Wound Bed Granulation Amount: Medium (34-66%) Exposed Structure Granulation Quality: Red, Pink Fascia Exposed: No Necrotic Amount: Medium (34-66%) Fat Layer (Subcutaneous Tissue) Exposed: Yes Necrotic Quality: Adherent Slough Tendon Exposed: No Muscle Exposed: No Joint Exposed: No Bone Exposed: No Treatment Notes Wound #1 (Lower Leg) Wound Laterality: Right, Medial Cleanser Soap and Water Discharge Instruction: Gently cleanse wound with antibacterial soap, rinse and pat dry prior to dressing wounds Wound Cleanser Discharge Instruction: Wash your hands with soap and water. Remove old dressing, discard into plastic bag and place into trash. Cleanse the wound with Wound Cleanser prior to applying [name] clean dressing using gauze sponges, not tissues or cotton balls. Do not scrub or use excessive force. Pat dry using gauze sponges, not tissue or cotton balls. Peri-Wound Care Topical Activon Honey Gel, 25 (g) Tube Primary Dressing Hydrofera Blue Ready  Transfer Foam, 2.5x2.5 (in/in) Discharge Instruction: Apply Hydrofera Blue Ready to wound bed as directed Secondary Dressing ABD Pad 5x9 (in/in) Discharge Instruction: Cover with ABD pad Secured With Compression Wrap Compression Stockings Add-Ons Electronic Signature(s) Signed: 05/19/2022 5:15:10 PM By: Gretta Cool, BSN, RN, CWS, Kim RN, BSN Signed: 05/19/2022 5:18:06 PM By: Massie Kluver Entered By: Massie Kluver on 05/19/2022 08:31:46 [name], Rashied [name] ([id number]) [id number] Page 9 of 9 -------------------------------------------------------------------------------- Vitals Details Patient Name: Date of Service: [name], [name]. 05/19/2022 8:15 [name] Medical Record Number: [id number] Patient Account Number: [id number] Date of Birth/Sex: Treating RN: 04/01/1945 (77 y.o. [name] Primary Care Katalea Ucci: Serina Cowper, EMILY Other Clinician: Massie Kluver Referring Hebert Dooling: Treating Math Brazie/Extender: Isaac Laud NKLIN, EMILY Weeks in Treatment: 5 Vital Signs Time Taken: 08:20 Temperature (F): 97.9 Height (in): 67 Pulse (bpm): 62 Weight (lbs): 224 Respiratory Rate (breaths/min): 18 Body Mass Index (BMI): 35.1 Blood Pressure (mmHg): 143/83 Reference Range: 80 - 120 mg / dl Electronic Signature(s) Signed: 05/19/2022 5:18:06 PM By: Massie Kluver Entered By: Massie Kluver on 05/19/2022 08:22:08

## 2022-05-21 NOTE — Progress Notes (Signed)
[name] ([id number]) [id number] Page 1 of 7 Visit Report for 05/19/2022 Chief Complaint Document Details Patient Name: Date of Service: [name], [name] MES A. 05/19/2022 8:15 A M Medical Record Number: [id number] Patient Account Number: [id number] Date of Birth/Sex: Treating RN: [birth year] (77 y.o. [name] Primary Care Provider: Serina Cowper, [facility] Other Clinician: Massie Kluver Referring Provider: Treating Provider/Extender: Wendall Papa, EMILY Weeks in Treatment: 5 Information Obtained from: Patient Chief Complaint 04/14/2022; right lower extremity wound Electronic Signature(s) Signed: 05/19/2022 9:10:00 AM By: [name] Shan DO Entered By: [name] on 05/19/2022 08:59:58 -------------------------------------------------------------------------------- Debridement Details Patient Name: Date of Service: [name] MES A. 05/19/2022 8:15 A M Medical Record Number: [id number] Patient Account Number: [id number] Date of Birth/Sex: Treating RN: 1946-01-15 (77 y.o. [name] Primary Care Provider: Serina Cowper, [facility] Other Clinician: Massie Kluver Referring Provider: Treating Provider/Extender: Wendall Papa, EMILY Weeks in Treatment: 5 Debridement Performed for Assessment: Wound #1 Right,Medial Lower Leg Performed By: Physician [name] Shan, MD Debridement Type: Debridement Severity of Tissue Pre Debridement: Fat layer exposed Level of Consciousness (Pre-procedure): Awake and Alert Pre-procedure Verification/Time Out Yes - 08:48 Taken: Start Time: 08:48 T Area Debrided (L x W): otal 1 (cm) x 1 (cm) = 1 (cm) Tissue and other material debrided: Viable, Non-Viable, Slough, Slough Level: Non-Viable Tissue Debridement Description: Selective/Open Wound Instrument: Curette Bleeding: Minimum Hemostasis Achieved: Pressure Response to Treatment: Procedure was tolerated well Level of Consciousness (Post- Awake and  Alert procedure): AADHAV, MONTO ([id number]) [id number] Page 2 of 7 Post Debridement Measurements of Total Wound Length: (cm) 1 Width: (cm) 1 Depth: (cm) 0.1 Volume: (cm) 0.079 Character of Wound/Ulcer Post Debridement: Stable Severity of Tissue Post Debridement: Fat layer exposed Post Procedure Diagnosis Same as Pre-procedure Electronic Signature(s) Signed: 05/19/2022 9:10:00 AM By: [name] Shan DO Signed: 05/19/2022 5:15:10 PM By: Gretta Cool, BSN, RN, CWS, Kim RN, BSN Signed: 05/19/2022 5:18:06 PM By: Massie Kluver Entered By: Massie Kluver on 05/19/2022 08:49:49 -------------------------------------------------------------------------------- HPI Details Patient Name: Date of Service: [name] MES A. 05/19/2022 8:15 A M Medical Record Number: [id number] Patient Account Number: [id number] Date of Birth/Sex: Treating RN: 1945/10/11 (77 y.o. [name] Primary Care Provider: Serina Cowper, [facility] Other Clinician: Massie Kluver Referring Provider: Treating Provider/Extender: Wendall Papa, EMILY Weeks in Treatment: 5 History of Present Illness HPI Description: 04/14/2022 [name] is a 77 year old male with a past medical history of diet-controlled type 2 diabetes, OSA, venous insufficiency and CAD that presents to the clinic for a 6 month history of nonhealing ulcer to the right lower extremity. He states the wound started spontaneously and he has been using Neosporin to the area. He wears compression stockings daily. He currently denies signs of infection. He denies pain to the site. The wound has remained stable over the past 6 months. ABIs in office were 1.08 on the right. 2/21; patient presents for follow-up. He did not tolerate the compression wrap well. He states it became too tight at the top and he did cut into it to relieve some of the pressure. He denies signs of infection. 2/28; patient presents for follow-up. He has been  using Medihoney and Hydrofera Blue to the wound bed under Tubigrip. He has no issues or complaints today. 3/11; patient presents for follow-up. He has been using Medihoney and Hydrofera Blue to the wound bed under Tubigrip. He has no issues or complaints today. There is been improvement in wound healing. 3/20; patient presents for follow-up. He has been  using Medihoney and Hydrofera Blue to the wound bed. Unfortunately he has too much trouble putting on his compression stockings and has not been using them. Electronic Signature(s) Signed: 05/19/2022 9:10:00 AM By: [name] Shan DO Entered By: [name] on 05/19/2022 09:01:00 MILAS, SHONG A ([id number]) [id number] Page 3 of 7 -------------------------------------------------------------------------------- Physical Exam Details Patient Name: Date of Service: [name] MES A. 05/19/2022 8:15 A M Medical Record Number: [id number] Patient Account Number: [id number] Date of Birth/Sex: Treating RN: 07/10/1945 (77 y.o. [name] Primary Care Provider: Serina Cowper, [facility] Other Clinician: Massie Kluver Referring Provider: Treating Provider/Extender: Isaac Laud NKLIN, EMILY Weeks in Treatment: 5 Constitutional . Cardiovascular . Psychiatric . Notes Right lower extremity: 2+ pitting edema to the knee. Circular open wound to the anterior aspect with nonviable tissue and granulated tissue. No signs of surrounding infection including increased warmth, erythema or purulent drainage. Venous stasis dermatitis noted throughout the leg. Varicose veins around the ankle. Electronic Signature(s) Signed: 05/19/2022 9:10:00 AM By: [name] Shan DO Entered By: [name] on 05/19/2022 09:01:20 -------------------------------------------------------------------------------- Physician Orders Details Patient Name: Date of Service: [name] MES A. 05/19/2022 8:15 A M Medical Record Number:  [id number] Patient Account Number: [id number] Date of Birth/Sex: Treating RN: 05-Jan-1946 (77 y.o. [name] Primary Care Provider: Serina Cowper, [facility] Other Clinician: Massie Kluver Referring Provider: Treating Provider/Extender: Wendall Papa, EMILY Weeks in Treatment: 5 Verbal / Phone Orders: Yes Clinician: Cornell Barman Read Back and Verified: Yes Diagnosis Coding Follow-up Appointments Return Appointment in 1 week. Bathing/ Shower/ Hygiene May shower with wound dressing protected with water repellent cover or cast protector. Anesthetic (Use 'Patient Medications' Section for Anesthetic Order Entry) Lidocaine applied to wound bed Edema Control - Lymphedema / Segmental Compressive Device / Other MASONJAMES, SCHIEFFER A ([id number]) [id number] Page 4 of 7 Patient to wear own compression stockings. Remove compression stockings every night before going to bed and put on every morning when getting up. Elevate, Exercise Daily and A void Standing for Long Periods of Time. Elevate legs to the level of the heart and pump ankles as often as possible Elevate leg(s) parallel to the floor when sitting. Wound Treatment Wound #1 - Lower Leg Wound Laterality: Right, Medial Cleanser: Soap and Water Every Other Day/30 Days Discharge Instructions: Gently cleanse wound with antibacterial soap, rinse and pat dry prior to dressing wounds Cleanser: Wound Cleanser Every Other Day/30 Days Discharge Instructions: Wash your hands with soap and water. Remove old dressing, discard into plastic bag and place into trash. Cleanse the wound with Wound Cleanser prior to applying a clean dressing using gauze sponges, not tissues or cotton balls. Do not scrub or use excessive force. Pat dry using gauze sponges, not tissue or cotton balls. Topical: Activon Honey Gel, 25 (g) Tube Every Other Day/30 Days Prim Dressing: Hydrofera Blue Ready Transfer Foam, 2.5x2.5 (in/in) (Dispense As  Written) Every Other Day/30 Days ary Discharge Instructions: Apply Hydrofera Blue Ready to wound bed as directed Secondary Dressing: ABD Pad 5x9 (in/in) (Generic) Every Other Day/30 Days Discharge Instructions: Cover with ABD pad Electronic Signature(s) Signed: 05/20/2022 11:20:18 AM By: Massie Kluver Signed: 05/20/2022 11:53:51 AM By: [name] Shan DO Previous Signature: 05/19/2022 9:10:00 AM Version By: [name] Shan DO Entered By: Massie Kluver on 05/20/2022 11:10:31 -------------------------------------------------------------------------------- Problem List Details Patient Name: Date of Service: [name] MES A. 05/19/2022 8:15 A M Medical Record Number: [id number] Patient Account Number: [id number] Date of Birth/Sex: Treating RN: 27-Oct-1945 (77 y.o. [name] Primary Care Provider: Pricilla Larsson  NKLIN, EMILY Other Clinician: Massie Kluver Referring Provider: Treating Provider/Extender: Wendall Papa, EMILY Weeks in Treatment: 5 Active Problems ICD-10 Encounter Code Description Active Date MDM Diagnosis L97.812 Non-pressure chronic ulcer of other part of right lower leg with fat layer 04/14/2022 No Yes exposed I87.311 Chronic venous hypertension (idiopathic) with ulcer of right lower extremity 04/14/2022 No Yes E11.622 Type 2 diabetes mellitus with other skin ulcer 04/14/2022 No Yes G47.33 Obstructive sleep apnea (adult) (pediatric) 04/14/2022 No Yes Mccosh, [place] A ([id number]) [id number] Page 5 of 7 Inactive Problems Resolved Problems Electronic Signature(s) Signed: 05/19/2022 9:10:00 AM By: [name] Shan DO Entered By: [name] on 05/19/2022 08:59:53 -------------------------------------------------------------------------------- Progress Note Details Patient Name: Date of Service: [name] MES A. 05/19/2022 8:15 A M Medical Record Number: [id number] Patient Account Number: [id number] Date of Birth/Sex: Treating  RN: 03/19/1945 (77 y.o. [name] Primary Care Provider: Serina Cowper, [facility] Other Clinician: Massie Kluver Referring Provider: Treating Provider/Extender: Wendall Papa, EMILY Weeks in Treatment: 5 Subjective Chief Complaint Information obtained from Patient 04/14/2022; right lower extremity wound History of Present Illness (HPI) 04/14/2022 Mr. Teven Heileman is a 77 year old male with a past medical history of diet-controlled type 2 diabetes, OSA, venous insufficiency and CAD that presents to the clinic for a 6 month history of nonhealing ulcer to the right lower extremity. He states the wound started spontaneously and he has been using Neosporin to the area. He wears compression stockings daily. He currently denies signs of infection. He denies pain to the site. The wound has remained stable over the past 6 months. ABIs in office were 1.08 on the right. 2/21; patient presents for follow-up. He did not tolerate the compression wrap well. He states it became too tight at the top and he did cut into it to relieve some of the pressure. He denies signs of infection. 2/28; patient presents for follow-up. He has been using Medihoney and Hydrofera Blue to the wound bed under Tubigrip. He has no issues or complaints today. 3/11; patient presents for follow-up. He has been using Medihoney and Hydrofera Blue to the wound bed under Tubigrip. He has no issues or complaints today. There is been improvement in wound healing. 3/20; patient presents for follow-up. He has been using Medihoney and Hydrofera Blue to the wound bed. Unfortunately he has too much trouble putting on his compression stockings and has not been using them. Objective Constitutional Vitals Time Taken: 8:20 AM, Height: 67 in, Weight: 224 lbs, BMI: 35.1, Temperature: 97.9 F, Pulse: 62 bpm, Respiratory Rate: 18 breaths/min, Blood Pressure: 143/83 mmHg. General Notes: Right lower extremity: 2+ pitting edema to the knee.  Circular open wound to the anterior aspect with nonviable tissue and granulated tissue. No signs of surrounding infection including increased warmth, erythema or purulent drainage. Venous stasis dermatitis noted throughout the leg. Varicose veins around the ankle. Integumentary (Hair, Skin) Wound #1 status is Open. Original cause of wound was Gradually Appeared. The date acquired was: 09/29/2021. The wound has been in treatment 5 weeks. The wound is located on the Right,Medial Lower Leg. The wound measures 1cm length x 0.9cm width x 0.1cm depth; 0.707cm^2 area and 0.071cm^3 volume. There is Fat Layer (Subcutaneous Tissue) exposed. There is a medium amount of serosanguineous drainage noted. There is medium (34-66%) red, pink granulation within the wound bed. There is a medium (34-66%) amount of necrotic tissue within the wound bed including Adherent Slough. 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 I debrided nonviable tissue. Unfortunately he did not tolerate our in office to wrap several weeks ago and has been using Tubigrip. This was not adequate edema control and I recommended he wear compression stockings however he cannot put these on. He is going to buy stocking donner to help with stocking placement. For now I recommended continuing Medihoney and Hydrofera Blue. If he is not able to get adequate edema control his wound has a lower chance to heal. He may need to go back to an in office wrap. Procedures Wound #1 Pre-procedure diagnosis of Wound #1 is a Diabetic Wound/Ulcer of the Lower Extremity located on the Right,Medial Lower Leg .Severity of  Tissue Pre Debridement is: Fat layer exposed. There was a Selective/Open Wound Non-Viable Tissue Debridement with a total area of 1 sq cm performed by [name] Shan, MD. With the following instrument(s): Curette to remove Viable and Non-Viable tissue/material. Material removed includes [place]. A time out was conducted at 08:48, prior to the start of the procedure. A Minimum amount of bleeding was controlled with Pressure. The procedure was tolerated well. Post Debridement Measurements: 1cm length x 1cm width x 0.1cm depth; 0.079cm^3 volume. Character of Wound/Ulcer Post Debridement is stable. Severity of Tissue Post Debridement is: Fat layer exposed. Post procedure Diagnosis Wound #1: Same as Pre-Procedure Plan Follow-up Appointments: Return Appointment in 1 week. Bathing/ Shower/ Hygiene: May shower with wound dressing protected with water repellent cover or cast protector. Anesthetic (Use 'Patient Medications' Section for Anesthetic Order Entry): Lidocaine applied to wound bed Edema Control - Lymphedema / Segmental Compressive Device / Other: Patient to wear own compression stockings. Remove compression stockings every night before going to bed and put on every morning when getting up. Elevate, Exercise Daily and Avoid Standing for Long Periods of Time. Elevate legs to the level of the heart and pump ankles as often as possible Elevate leg(s) parallel to the floor when sitting. WOUND #1: - Lower Leg Wound Laterality: Right, Medial Cleanser: Soap and Water Every Other Day/30 Days Discharge Instructions: Gently cleanse wound with antibacterial soap, rinse and pat dry prior to dressing wounds Cleanser: Wound Cleanser Every Other Day/30 Days Discharge Instructions: Wash your hands with soap and water. Remove old dressing, discard into plastic bag and place into trash. Cleanse the wound with Wound Cleanser prior to applying a clean dressing using gauze sponges, not tissues or cotton balls. Do  not scrub or use excessive force. Pat dry using gauze sponges, not tissue or cotton balls. Topical: Activon Honey Gel, 25 (g) Tube Every Other Day/30 Days Prim Dressing: Hydrofera Blue Ready Transfer Foam, 2.5x2.5 (in/in) (Dispense As Written) Every Other Day/30 Days ary Discharge Instructions: Apply Hydrofera Blue Ready to wound bed as directed Secondary Dressing: ABD Pad 5x9 (in/in) (Generic) Every Other Day/30 Days Discharge Instructions: Cover with ABD pad 1. In office sharp debridement 2. Medihoney and Hydrofera Blue 3. Compression stockings daily 4. Follow-up in 1 week Electronic Signature(s) Signed: 05/19/2022 9:10:00 AM By: [name] Shan DO Entered By: [name] on 05/19/2022 09:04:42 Cooley, Gerda Diss ([id number]) [id number] Page 7 of 7 -------------------------------------------------------------------------------- SuperBill Details Patient Name: Date of Service: ROLAND, LEMMEN MES A. 05/19/2022 Medical Record Number: [id number] Patient Account Number: [id number] Date of Birth/Sex: Treating RN: 1945-11-08 (77 y.o. Isac Sarna, Maudie Mercury  Primary Care Provider: Serina Cowper, [place] Other Clinician: Massie Kluver Referring Provider: Treating Provider/Extender: Wendall Papa, EMILY Weeks in Treatment: 5 Diagnosis Coding ICD-10 Codes Code Description [phone] Non-pressure chronic ulcer of other part of right lower leg with fat layer exposed I87.311 Chronic venous hypertension (idiopathic) with ulcer of right lower extremity E11.622 Type 2 diabetes mellitus with other skin ulcer G47.33 Obstructive sleep apnea (adult) (pediatric) Facility Procedures : 7 CPT4 Code: [id number] Description: [id number] - DEBRIDE WOUND 1ST 20 SQ CM OR < ICD-10 Diagnosis Description L97.812 Non-pressure chronic ulcer of other part of right lower leg with fat layer expos I87.311 Chronic venous hypertension (idiopathic) with ulcer of right lower  extremity E11.622 Type 2 diabetes  mellitus with other skin ulcer Modifier: ed Quantity: 1 Physician Procedures : CPT4 Code Description Modifier [id number] - WC PHYS DEBR WO ANESTH 20 SQ CM ICD-10 Diagnosis Description [id number] Non-pressure chronic ulcer of other part of right lower leg with fat layer exposed I87.311 Chronic venous hypertension (idiopathic) with  ulcer of right lower extremity E11.622 Type 2 diabetes mellitus with other skin ulcer Quantity: 1 Electronic Signature(s) Signed: 05/19/2022 9:10:00 AM By: [name] Shan DO Entered By: [name] on 05/19/2022 09:04:55

## 2022-05-24 NOTE — Progress Notes (Signed)
Subjective:  Patient ID: [name], male    DOB: [birth year],  MRN: [id number] HPI Chief Complaint  Patient presents with   Debridement    Requesting nail care - hallux right tender and curved, thick and unable to cut   New Patient (Initial Visit)    Est pt 2020    77 y.o. male presents with the above complaint.   ROS: Denies fever chills nausea vomiting muscle aches pains calf pain back pain chest pain shortness of breath.  Past Medical History:  Diagnosis Date   Anxiety    Arteriosclerosis of coronary artery 11/16/2011   Overview:  Stent 10/2011 stent rca 2015 with collaterals to lad which is chronically occluded    Benign enlargement of prostate    Benign essential HTN 06/11/2014   Benign prostatic hypertrophy without urinary obstruction 07/31/2014   Bilateral cataracts 05/16/2013   Overview:  Dr. [facility]     Bone spur of foot    Left   BP (high blood pressure) 11/09/2012   Cancer ([place])    skin (forehead) and bladder   Carotid artery narrowing 02/08/2014   Depression    Detrusor hypertrophy    Diabetes ([place])    Diabetes mellitus, type 2 ([place]) 12/12/2012   Diverticulosis    Dyspnea    Esophageal reflux    Esophageal reflux    Fothergill's neuralgia 08/07/2012   Overview:  [facility]    Gastritis    GERD (gastroesophageal reflux disease)    Headache    cluster headaches   Healed myocardial infarct 11/09/2012   Hearing loss in left ear    Heart disease    Hematuria    Hemorrhoids    History of hiatal hernia 12/14/2017   small    Hypercholesteremia    Lesion of bladder    Myocardial infarct (HCC)    Presence of stent in coronary artery 11/09/2012   Rectal bleeding    Trigeminal neuralgia    Trigeminal neuralgia    Valvular heart disease    Vitamin D deficiency    Past Surgical History:  Procedure Laterality Date   APPENDECTOMY     BOTOX INJECTION N/A 12/21/2017   Procedure: Bladder BOTOX INJECTION;  Surgeon: [name], MD;  Location:  [facility] ORS;  Service: Urology;  Laterality: N/A;   BOTOX INJECTION N/A 09/11/2018   Procedure: Bladder BOTOX INJECTION;  Surgeon: [name], MD;  Location: [facility] ORS;  Service: Urology;  Laterality: N/A;   CARDIAC CATHETERIZATION     CARDIAC CATHETERIZATION N/A 01/22/2015   Procedure: Left Heart Cath;  Surgeon: Corey Skains, MD;  Location: [place] CV LAB;  Service: Cardiovascular;  Laterality: N/A;   CARDIAC CATHETERIZATION N/A 01/22/2015   Procedure: Coronary Stent Intervention;  Surgeon: Isaias Cowman, MD;  Location: [place] [facility];  Service: Cardiovascular;  Laterality: N/A;   CATARACT EXTRACTION, BILATERAL     COLONOSCOPY WITH PROPOFOL N/A 12/08/2015   Procedure: COLONOSCOPY WITH PROPOFOL;  Surgeon: Lollie Sails, MD;  Location: [facility] ENDOSCOPY;  Service: Endoscopy;  Laterality: N/A;   COLONOSCOPY WITH PROPOFOL N/A 12/09/2015   Procedure: COLONOSCOPY WITH PROPOFOL;  Surgeon: Lollie Sails, MD;  Location: [facility] ENDOSCOPY;  Service: Endoscopy;  Laterality: N/A;   CORONARY ANGIOPLASTY     5 stents   CORONARY STENT PLACEMENT  2015   x5   CYSTOSCOPY N/A 09/11/2018   Procedure: CYSTOSCOPY;  Surgeon: [name], MD;  Location: [facility] ORS;  Service: Urology;  Laterality: N/A;   CYSTOSCOPY  WITH BIOPSY N/A 12/21/2017   Procedure: CYSTOSCOPY WITH BIOPSY;  Surgeon: [name], MD;  Location: [facility] ORS;  Service: Urology;  Laterality: N/A;   ESOPHAGOGASTRODUODENOSCOPY (EGD) WITH PROPOFOL N/A 12/08/2015   Procedure: ESOPHAGOGASTRODUODENOSCOPY (EGD) WITH PROPOFOL;  Surgeon: Lollie Sails, MD;  Location[facility] ENDOSCOPY;  Service: Endoscopy;  Laterality: N/A;   ESOPHAGOGASTRODUODENOSCOPY (EGD) WITH PROPOFOL N/A 12/13/2017   Procedure: ESOPHAGOGASTRODUODENOSCOPY (EGD) WITH PROPOFOL;  Surgeon: Lollie Sails, MD;  Location: [facility] ENDOSCOPY;  Service: Endoscopy;  Laterality: N/A;   EYE SURGERY     HERNIA REPAIR     kidney tumor remove     TRANSURETHRAL RESECTION  OF BLADDER TUMOR WITH GYRUS (TURBT-GYRUS)  123456   UMBILICAL HERNIA REPAIR     urethral meatotomy      Current Outpatient Medications:    albuterol (VENTOLIN HFA) 108 (90 Base) MCG/ACT inhaler, , Disp: , Rfl:    aspirin 81 MG tablet, Take 81 mg by mouth daily., Disp: , Rfl:    Blood Glucose Monitoring Suppl (RA BLOOD GLUCOSE MONITOR) DEVI, by Does not apply route., Disp: , Rfl:    Cholecalciferol 25 MCG (1000 UT) tablet, Take 1,000 Units by mouth daily. , Disp: , Rfl:    citalopram (CELEXA) 20 MG tablet, Take 20 mg by mouth at bedtime. , Disp: , Rfl:    Docusate Sodium 100 MG capsule, Take by mouth., Disp: , Rfl:    hydrocortisone 2.5 % cream, , Disp: , Rfl:    isosorbide mononitrate (IMDUR) 30 MG 24 hr tablet, Take 60 mg by mouth daily. , Disp: , Rfl:    ketoconazole (NIZORAL) 2 % cream, , Disp: , Rfl:    Multiple Vitamins-Iron (MULTIVITAMINS WITH IRON) TABS tablet, Take 1 tablet by mouth daily., Disp: , Rfl:    nitroGLYCERIN (NITROSTAT) 0.4 MG SL tablet, Place under the tongue., Disp: , Rfl:    ONE TOUCH ULTRA TEST test strip, USE 3 (THREE) TIMES DAILY USE AS INSTRUCTED.- ONE TOUCH ULTRA BLOOD GLUCOSE METER STRIPS, Disp: , Rfl:    OXcarbazepine (TRILEPTAL) 150 MG tablet, Take 150 mg by mouth at bedtime. , Disp: , Rfl:    pantoprazole (PROTONIX) 40 MG tablet, Take 40 mg by mouth daily., Disp: , Rfl:    pramipexole (MIRAPEX) 1 MG tablet, Take 1 mg by mouth daily., Disp: , Rfl:    rosuvastatin (CRESTOR) 20 MG tablet, Take by mouth., Disp: , Rfl:    terbinafine (LAMISIL) 250 MG tablet, Take by mouth., Disp: , Rfl:    valsartan (DIOVAN) 40 MG tablet, Take 40 mg by mouth daily., Disp: , Rfl:    Vibegron 75 MG TABS, Take 75 mg by mouth daily., Disp: 30 tablet, Rfl: 11  Allergies  Allergen Reactions   Ace Inhibitors Cough   Carbamazepine Rash    Other reaction(s): RASH Other reaction(s): RASH    Lyrica [Pregabalin] Rash   Review of Systems Objective:  There were no vitals filed for  this visit.  General: Well developed, nourished, in no acute distress, alert and oriented x3   Dermatological: Skin is warm, dry and supple bilateral. Nails x 10 are well maintained; remaining integument appears unremarkable at this time. There are no open sores, no preulcerative lesions, no rash or signs of infection present.  Vascular: Dorsalis Pedis artery and Posterior Tibial artery pedal pulses are 2/4 bilateral with immedate capillary fill time. Pedal hair growth present. No varicosities and no lower extremity edema present bilateral.   Neruologic: Grossly intact via light touch bilateral.  Vibratory intact via tuning fork bilateral. Protective threshold with Semmes Wienstein monofilament intact to all pedal sites bilateral. Patellar and Achilles deep tendon reflexes 2+ bilateral. No Babinski or clonus noted bilateral.   Musculoskeletal: No gross boney pedal deformities bilateral. No pain, crepitus, or limitation noted with foot and ankle range of motion bilateral. Muscular strength 5/5 in all groups tested bilateral.  Gait: Unassisted, Nonantalgic.    Radiographs:  None taken  Assessment & Plan:   Assessment: Thick dystrophic nails.  Plan: Debrided toenails bilateral.     Hennesy Sobalvarro T. Colon, [place]

## 2022-05-26 ENCOUNTER — Encounter (HOSPITAL_BASED_OUTPATIENT_CLINIC_OR_DEPARTMENT_OTHER): Payer: Medicare HMO | Admitting: Internal Medicine

## 2022-05-26 DIAGNOSIS — L97812 Non-pressure chronic ulcer of other part of right lower leg with fat layer exposed: Secondary | ICD-10-CM

## 2022-05-26 DIAGNOSIS — I87311 Chronic venous hypertension (idiopathic) with ulcer of right lower extremity: Secondary | ICD-10-CM | POA: Diagnosis not present

## 2022-05-26 DIAGNOSIS — G4733 Obstructive sleep apnea (adult) (pediatric): Secondary | ICD-10-CM

## 2022-05-26 DIAGNOSIS — E11622 Type 2 diabetes mellitus with other skin ulcer: Secondary | ICD-10-CM | POA: Diagnosis not present

## 2022-05-26 NOTE — Progress Notes (Signed)
SORA, URGILES [name] ([id number]) [id number] Page 1 of 6 Visit Report for 05/26/2022 Chief Complaint Document Details Patient Name: Date of Service: [name], [name]. 05/26/2022 9:15 [name] Medical Record Number: [id number] Patient Account Number: [id number] Date of Birth/Sex: Treating RN: [birth year] (77 y.o. [name] Primary Care Provider: Serina Cowper, [facility] Other Clinician: Massie Kluver Referring Provider: Treating Provider/Extender: Wendall Papa, EMILY Weeks in Treatment: 6 Information Obtained from: Patient Chief Complaint 04/14/2022; right lower extremity wound Electronic Signature(s) Signed: 05/26/2022 11:48:34 AM By: Kalman Shan DO Entered By: Kalman Shan on 05/26/2022 09:36:28 -------------------------------------------------------------------------------- HPI Details Patient Name: Date of Service: [name]. 05/26/2022 9:15 [name] Medical Record Number: [id number] Patient Account Number: [id number] Date of Birth/Sex: Treating RN: 10-14-1945 (77 y.o. [name] Primary Care Provider: Serina Cowper, [facility] Other Clinician: Massie Kluver Referring Provider: Treating Provider/Extender: Wendall Papa, EMILY Weeks in Treatment: 6 History of Present Illness HPI Description: 04/14/2022 [name] is [name] with [name] past medical history of diet-controlled type 2 diabetes, OSA, venous insufficiency and CAD that presents to the clinic for [name] to the right lower extremity. He states the wound started spontaneously and he has been using Neosporin to the area. He wears compression stockings daily. He currently denies signs of infection. He denies pain to the site. The wound has remained stable over the past 6 months. ABIs in office were 1.08 on the right. 2/21; patient presents for follow-up. He did not tolerate the compression wrap well. He states it became too tight at the top and  he did cut into it to relieve some of the pressure. He denies signs of infection. 2/28; patient presents for follow-up. He has been using Medihoney and Hydrofera Blue to the wound bed under Tubigrip. He has no issues or complaints today. 3/11; patient presents for follow-up. He has been using Medihoney and Hydrofera Blue to the wound bed under Tubigrip. He has no issues or complaints today. There is been improvement in wound healing. 3/20; patient presents for follow-up. He has been using Medihoney and Hydrofera Blue to the wound bed. Unfortunately he has too much trouble putting on his compression stockings and has not been using them. 3/27; patient presents for follow-up. We have been using Medihoney and Hydrofera Blue to the wound bed. Patient states the Medihoney is causing burning pain. He has been using Tubigrip for compression. Electronic Signature(s) Signed: 05/26/2022 11:48:34 AM By: Kalman Shan DO Entered By: Kalman Shan on 05/26/2022 09:36:55 -------------------------------------------------------------------------------- Physical Exam Details Patient Name: Date of Service: [name]. 05/26/2022 9:15 [name] Medical Record Number: [id number] Patient Account Number: [id number] Date of Birth/Sex: Treating RN: [birth year] (77 y.o. [name] Primary Care Provider: Serina Cowper, [facility] Other Clinician: Massie Kluver Referring Provider: Treating Provider/Extender: Wendall Papa, EMILY Weeks in Treatment: [address] [name], [name] ([id number]) [id number] Page 2 of 6 Constitutional . Cardiovascular . Psychiatric . Notes Right lower extremity: 2+ pitting edema to the knee. Circular open wound to the anterior aspect with granulated tissue although unhealthy appearance. No signs of surrounding infection including increased warmth, erythema or purulent drainage. Venous stasis dermatitis noted throughout the leg. Varicose veins around the  ankle. Electronic Signature(s) Signed: 05/26/2022 11:48:34 AM By: Kalman Shan DO Entered By: Kalman Shan on 05/26/2022 09:37:30 -------------------------------------------------------------------------------- Physician Orders Details Patient Name: Date of Service: [name]. 05/26/2022 9:15 [name] Medical Record Number: [id number] Patient Account Number:  [id number] Date of Birth/Sex: Treating RN: November 10, 1945 (77 y.o. [name] Primary Care Provider: Serina Cowper, [facility] Other Clinician: Massie Kluver Referring Provider: Treating Provider/Extender: Wendall Papa, EMILY Weeks in Treatment: 6 Verbal / Phone Orders: Yes Clinician: Cornell Barman Read Back and Verified: Yes Diagnosis Coding Follow-up Appointments Return Appointment in 1 week. Bathing/ Shower/ Hygiene May shower with wound dressing protected with water repellent cover or cast protector. Anesthetic (Use 'Patient Medications' Section for Anesthetic Order Entry) Lidocaine applied to wound bed Edema Control - Lymphedema / Segmental Compressive Device / Other Patient to wear own compression stockings. Remove compression stockings every night before going to bed and put on every morning when getting up. Elevate, Exercise Daily and [name] void Standing for Long Periods of Time. Elevate legs to the level of the heart and pump ankles as often as possible Elevate leg(s) parallel to the floor when sitting. Wound Treatment Wound #1 - Lower Leg Wound Laterality: Right, Medial Cleanser: Soap and Water Every Other Day/30 Days Discharge Instructions: Gently cleanse wound with antibacterial soap, rinse and pat dry prior to dressing wounds Cleanser: Wound Cleanser Every Other Day/30 Days Discharge Instructions: Wash your hands with soap and water. Remove old dressing, discard into plastic bag and place into trash. Cleanse the wound with Wound Cleanser prior to applying [name] clean dressing using gauze sponges, not tissues or  cotton balls. Do not scrub or use excessive force. Pat dry using gauze sponges, not tissue or cotton balls. Prim Dressing: Hydrofera Blue Ready Transfer Foam, 2.5x2.5 (in/in) (Dispense As Written) Every Other Day/30 Days ary Discharge Instructions: Apply Hydrofera Blue Ready to wound bed as directed Secondary Dressing: ABD Pad 5x9 (in/in) (Generic) Every Other Day/30 Days Discharge Instructions: Cover with ABD pad Electronic Signature(s) Signed: 05/27/2022 9:18:57 AM By: Massie Kluver Signed: 05/27/2022 10:38:15 AM By: Kalman Shan DO Previous Signature: 05/26/2022 11:48:34 AM Version By: Kalman Shan DO Entered By: Massie Kluver on 05/27/2022 09:13:49 Derocher, Gerda Diss ([id number]) [id number] Page 3 of 6 -------------------------------------------------------------------------------- Problem List Details Patient Name: Date of Service: [name], [name]. 05/26/2022 9:15 [name] Medical Record Number: [id number] Patient Account Number: [id number] Date of Birth/Sex: Treating RN: [birth year] (77 y.o. [name] Primary Care Provider: Serina Cowper, [facility] Other Clinician: Massie Kluver Referring Provider: Treating Provider/Extender: Wendall Papa, EMILY Weeks in Treatment: 6 Active Problems ICD-10 Encounter Code Description Active Date MDM Diagnosis L97.812 Non-pressure chronic ulcer of other part of right lower leg with fat layer 04/14/2022 No Yes exposed I87.311 Chronic venous hypertension (idiopathic) with ulcer of right lower extremity 04/14/2022 No Yes E11.622 Type 2 diabetes mellitus with other skin ulcer 04/14/2022 No Yes G47.33 Obstructive sleep apnea (adult) (pediatric) 04/14/2022 No Yes Inactive Problems Resolved Problems Electronic Signature(s) Signed: 05/26/2022 11:48:34 AM By: Kalman Shan DO Entered By: Kalman Shan on 05/26/2022 09:36:25 -------------------------------------------------------------------------------- Progress  Note Details Patient Name: Date of Service: [name]. 05/26/2022 9:15 [name] Medical Record Number: [id number] Patient Account Number: [id number] Date of Birth/Sex: Treating RN: 01-11-1946 (77 y.o. [name] Primary Care Provider: Serina Cowper, [facility] Other Clinician: Massie Kluver Referring Provider: Treating Provider/Extender: Wendall Papa, EMILY Weeks in Treatment: 6 Subjective Chief Complaint Information obtained from Patient 04/14/2022; right lower extremity wound History of Present Illness (HPI) 04/14/2022 Mr. Zavon Gausman is [name] with [name] past medical history of diet-controlled type 2 diabetes, OSA, venous insufficiency and CAD that presents to the clinic for [name] to  the right lower extremity. He states the wound started spontaneously and he has been using Neosporin to the area. He wears compression stockings daily. He currently denies signs of infection. He denies pain to the site. The wound has remained stable over the past 6 months. ABIs in office were 1.08 on the right. 2/21; patient presents for follow-up. He did not tolerate the compression wrap well. He states it became too tight at the top and he did cut into it to relieve some of the pressure. He denies signs of infection. 2/28; patient presents for follow-up. He has been using Medihoney and Hydrofera Blue to the wound bed under Tubigrip. He has no issues or complaints today. 3/11; patient presents for follow-up. He has been using Medihoney and Hydrofera Blue to the wound bed under Tubigrip. He has no issues or complaints today. There is been improvement in wound healing. 3/20; patient presents for follow-up. He has been using Medihoney and Hydrofera Blue to the wound bed. Unfortunately he has too much trouble putting on his compression stockings and has not been using them. [name], [name] ([id number]) [id number] Page 4 of 6 3/27;  patient presents for follow-up. We have been using Medihoney and Hydrofera Blue to the wound bed. Patient states the Medihoney is causing burning pain. He has been using Tubigrip for compression. Objective Constitutional Vitals Time Taken: 9:14 AM, Height: 67 in, Weight: 224 lbs, BMI: 35.1, Temperature: 97.6 F, Pulse: 66 bpm, Respiratory Rate: 18 breaths/min, Blood Pressure: 107/64 mmHg. General Notes: Right lower extremity: 2+ pitting edema to the knee. Circular open wound to the anterior aspect with granulated tissue although unhealthy appearance. No signs of surrounding infection including increased warmth, erythema or purulent drainage. Venous stasis dermatitis noted throughout the leg. Varicose veins around the ankle. Integumentary (Hair, Skin) Wound #1 status is Open. Original cause of wound was Gradually Appeared. The date acquired was: 09/29/2021. The wound has been in treatment 6 weeks. The wound is located on the Right,Medial Lower Leg. The wound measures 0.8cm length x 0.8cm width x 0.1cm depth; 0.503cm^2 area and 0.05cm^3 volume. There is Fat Layer (Subcutaneous Tissue) exposed. There is [name] medium amount of serosanguineous drainage noted. There is medium (34-66%) red, pink granulation within the wound bed. There is [name] medium (34-66%) amount of necrotic tissue within the wound bed including Adherent Slough. Assessment Active Problems ICD-10 Non-pressure chronic ulcer of other part of right lower leg with fat layer exposed Chronic venous hypertension (idiopathic) with ulcer of right lower extremity Type 2 diabetes mellitus with other skin ulcer Obstructive sleep apnea (adult) (pediatric) Patient's wound is slightly smaller today. Since Medihoney is causing him burning pain I recommended he stop this. Continue just with Hydrofera Blue. He has hard time wearing compression stockings. Continue Tubigrip. If in 2 weeks this does not show significant improvement I recommended going back to  the in office wrap as the swelling is not controlled. Plan Follow-up Appointments: Return Appointment in 1 week. Bathing/ Shower/ Hygiene: May shower with wound dressing protected with water repellent cover or cast protector. Anesthetic (Use 'Patient Medications' Section for Anesthetic Order Entry): Lidocaine applied to wound bed Edema Control - Lymphedema / Segmental Compressive Device / Other: Patient to wear own compression stockings. Remove compression stockings every night before going to bed and put on every morning when getting up. Elevate, Exercise Daily and Avoid Standing for Long Periods of Time. Elevate legs to the level of the heart and pump ankles as often as possible Elevate leg(s) parallel  to the floor when sitting. WOUND #1: - Lower Leg Wound Laterality: Right, Medial Cleanser: Soap and Water Every Other Day/30 Days Discharge Instructions: Gently cleanse wound with antibacterial soap, rinse and pat dry prior to dressing wounds Cleanser: Wound Cleanser Every Other Day/30 Days Discharge Instructions: Wash your hands with soap and water. Remove old dressing, discard into plastic bag and place into trash. Cleanse the wound with Wound Cleanser prior to applying [name] clean dressing using gauze sponges, not tissues or cotton balls. Do not scrub or use excessive force. Pat dry using gauze sponges, not tissue or cotton balls. Prim Dressing: Hydrofera Blue Ready Transfer Foam, 2.5x2.5 (in/in) (Dispense As Written) Every Other Day/30 Days ary Discharge Instructions: Apply Hydrofera Blue Ready to wound bed as directed Secondary Dressing: ABD Pad 5x9 (in/in) (Generic) Every Other Day/30 Days Discharge Instructions: Cover with ABD pad 1. Hydrofera Blue 2. Follow-up in 2 weeks Electronic Signature(s) Signed: 05/26/2022 11:48:34 AM By: Charlcie Cradle ([id number]) [id number] Page 5 of 6 Entered By: Kalman Shan on 05/26/2022  09:38:14 -------------------------------------------------------------------------------- ROS/PFSH Details Patient Name: Date of Service: [name], [name]. 05/26/2022 9:15 [name] Medical Record Number: [id number] Patient Account Number: [id number] Date of Birth/Sex: Treating RN: 1945-06-01 (77 y.o. [name] Primary Care Provider: Serina Cowper, [facility] Other Clinician: Massie Kluver Referring Provider: Treating Provider/Extender: Wendall Papa, EMILY Weeks in Treatment: 6 Information Obtained From Patient Cardiovascular Medical History: Positive for: Coronary Artery Disease; Hypertension; Myocardial Infarction Endocrine Medical History: Positive for: Type II Diabetes Time with diabetes: 5 Immunizations Pneumococcal Vaccine: Received Pneumococcal Vaccination: Yes Received Pneumococcal Vaccination On or After 60th Birthday: Yes Implantable Devices None Family and Social History Former smoker; Marital Status - Married; Alcohol Use: Never; Drug Use: No History; Caffeine Use: Never Electronic Signature(s) Signed: 05/26/2022 11:48:34 AM By: Kalman Shan DO Signed: 05/26/2022 2:54:42 PM By: Gretta Cool, BSN, RN, CWS, Kim RN, BSN Entered By: Kalman Shan on 05/26/2022 09:38:42 -------------------------------------------------------------------------------- Christine Details Patient Name: Date of Service: [name]. 05/26/2022 Medical Record Number: [id number] Patient Account Number: [id number] Date of Birth/Sex: Treating RN: 11-Mar-1945 (77 y.o. [name], [name] Primary Care Provider: Serina Cowper, EMILY Other Clinician: Massie Kluver Referring Provider: Treating Provider/Extender: Wendall Papa, EMILY Weeks in Treatment: 6 Diagnosis Coding ICD-10 Codes Code Description [id number] Non-pressure chronic ulcer of other part of right lower leg with fat layer exposed I87.311 Chronic venous hypertension (idiopathic) with ulcer of right lower extremity E11.622 Type  2 diabetes mellitus with other skin ulcer G47.33 Obstructive sleep apnea (adult) (pediatric) Facility Procedures : 7 CPT4 Code: [id number] Description: [place] VISIT-LEV 3 EST PT Modifier: Quantity: 1 Physician Procedures JAASON, TONN ([id number]): CPT4 Code Description [id number] - WC PHYS LEVEL 3 - EST PT ICD-10 Diagnosis Description [id number] Non-pressure chronic ulcer of other part of right lower leg with f I87.311 Chronic venous hypertension (idiopathic) with ulcer  of right lower E11.622 Type 2 diabetes mellitus with other skin ulcer G47.33 Obstructive sleep apnea (adult) (pediatric) [id number] Page 6 of 6: Quantity Modifier 1 at layer exposed extremity Electronic Signature(s) Signed: 05/26/2022 11:48:34 AM By: Kalman Shan DO Entered By: Kalman Shan on 05/26/2022 09:38:26
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
## 2022-05-31 ENCOUNTER — Other Ambulatory Visit: Payer: Self-pay | Admitting: Urology

## 2022-06-09 ENCOUNTER — Encounter: Payer: Medicare HMO | Attending: Physician Assistant | Admitting: Internal Medicine

## 2022-06-09 DIAGNOSIS — E11622 Type 2 diabetes mellitus with other skin ulcer: Secondary | ICD-10-CM | POA: Diagnosis not present

## 2022-06-09 DIAGNOSIS — L97812 Non-pressure chronic ulcer of other part of right lower leg with fat layer exposed: Secondary | ICD-10-CM | POA: Insufficient documentation

## 2022-06-09 DIAGNOSIS — G4733 Obstructive sleep apnea (adult) (pediatric): Secondary | ICD-10-CM | POA: Insufficient documentation

## 2022-06-09 DIAGNOSIS — I87311 Chronic venous hypertension (idiopathic) with ulcer of right lower extremity: Secondary | ICD-10-CM | POA: Insufficient documentation

## 2022-06-10 NOTE — Progress Notes (Signed)
EMMYTT, REGGIO [name] ([id number]) [id number] Page 1 of 8 Visit Report for 06/09/2022 Arrival Information Details Patient Name: Date of Service: [name] [name], [name] [name]. 06/09/2022 10:15 [name] [name] Medical Record Number: [id number] Patient Account Number: [id number] Date of Birth/Sex: Treating [name]: 06/25/1945 (77 y.o. [name] [name]) [name] [name] Primary Care [name] [name]: [name] [name], [facility] Other Clinician: Referring [name] [name]: Treating [name] [name]/Extender: [name] [name], [name] [name]: 8 Visit Information History Since Last Visit Added or deleted any medications: No Patient Arrived: Ambulatory Any new allergies or adverse reactions: No Arrival Time: 10:21 Had [name] fall or experienced change in No Accompanied By: wife activities of daily living that may affect Transfer Assistance: None risk of falls: Patient Identification Verified: Yes Signs or symptoms of abuse/neglect since last visito No Secondary Verification Process Completed: Yes Hospitalized since last visit: No Patient Requires Transmission-Based Precautions: No Implantable device outside of the clinic excluding No Patient Has Alerts: Yes cellular tissue based products placed in the center Patient Alerts: Patient on Blood Thinner since last visit: Baby ASA 81mg  Has Dressing in Place as Prescribed: Yes Pain Present Now: No Electronic Signature(s) Signed: 06/10/2022 4:28:11 PM By: [name] [name] Entered By: [name] [name] on 06/09/2022 10:25:03 -------------------------------------------------------------------------------- Clinic Level of Care Assessment Details Patient Name: Date of Service: [name] [name], [name] [name]. 06/09/2022 10:15 [name] [name] Medical Record Number: [id number] Patient Account Number: [id number] Date of Birth/Sex: Treating [name]: [birth year] (77 y.o. [name] [name]) [name] [name] Primary Care [name] [name]: [name] [name], [facility] Other Clinician: Referring [name] [name]: Treating [name] [name]/Extender: Baruch Gouty NKLIN, [name] Weeks in  [name]: 8 Clinic Level of Care Assessment Items TOOL 4 Quantity Score X- 1 0 Use when only an EandM is performed on FOLLOW-UP visit ASSESSMENTS - Nursing Assessment / Reassessment X- 1 10 Reassessment of Co-morbidities (includes updates in patient status) X- 1 5 Reassessment of Adherence to [name] Plan ASSESSMENTS - Wound and Skin [name] ssessment / Reassessment X - Simple Wound Assessment / Reassessment - one wound 1 5 []  - 0 Complex Wound Assessment / Reassessment - multiple wounds []  - 0 Dermatologic / Skin Assessment (not related to wound area) ASSESSMENTS - Focused Assessment []  - 0 Circumferential Edema Measurements - multi extremities []  - 0 Nutritional Assessment / Counseling / Intervention []  - 0 Lower Extremity Assessment (monofilament, tuning fork, pulses) []  - 0 Peripheral Arterial Disease Assessment (using hand held doppler) ASSESSMENTS - Ostomy and/or Continence Assessment and Care []  - 0 Incontinence Assessment and Management []  - 0 Ostomy Care Assessment and Management (repouching, etc.) PROCESS - Coordination of Care X - Simple Patient / Family Education for ongoing care 1 [address] [place] [name] ([id number]) [phone].pdf Page 2 of 8 []  - 0 Complex (extensive) Patient / Family Education for ongoing care []  - 0 Staff obtains Chiropractor, Records, T Results / Process Orders est []  - 0 Staff telephones HHA, Nursing Homes / Clarify orders / etc []  - 0 Routine Transfer to another Facility (non-emergent condition) []  - 0 Routine Hospital Admission (non-emergent condition) []  - 0 New Admissions / Manufacturing engineer / Ordering NPWT Apligraf, etc. , []  - 0 Emergency Hospital Admission (emergent condition) X- 1 10 Simple Discharge Coordination []  - 0 Complex (extensive) Discharge Coordination PROCESS - Special Needs []  - 0 Pediatric / Minor Patient Management []  - 0 Isolation Patient Management []  - 0 Hearing / Language / Visual  special needs []  - 0 Assessment of Community assistance (transportation, D/C planning, etc.) []  - 0 Additional assistance / Altered mentation []  - 0 Support Surface(s) Assessment (bed,  cushion, seat, etc.) INTERVENTIONS - Wound Cleansing / Measurement X - Simple Wound Cleansing - one wound 1 5 []  - 0 Complex Wound Cleansing - multiple wounds X- 1 5 Wound Imaging (photographs - any number of wounds) []  - 0 Wound Tracing (instead of photographs) X- 1 5 Simple Wound Measurement - one wound []  - 0 Complex Wound Measurement - multiple wounds INTERVENTIONS - Wound Dressings X - Small Wound Dressing one or multiple wounds 1 10 []  - 0 Medium Wound Dressing one or multiple wounds []  - 0 Large Wound Dressing one or multiple wounds []  - 0 Application of Medications - topical []  - 0 Application of Medications - injection INTERVENTIONS - Miscellaneous []  - 0 External ear exam []  - 0 Specimen Collection (cultures, biopsies, blood, body fluids, etc.) []  - 0 Specimen(s) / Culture(s) sent or taken to Lab for analysis []  - 0 Patient Transfer (multiple staff / Nurse, adult / Similar devices) []  - 0 Simple Staple / Suture removal (25 or less) []  - 0 Complex Staple / Suture removal (26 or more) []  - 0 Hypo / Hyperglycemic Management (close monitor of Blood Glucose) []  - 0 Ankle / Brachial Index (ABI) - do not check if billed separately X- 1 5 Vital Signs Has the patient been seen at the hospital within the last three years: Yes Total Score: 75 Level Of Care: New/Established - Level 2 Electronic Signature(s) Signed: 06/10/2022 4:28:11 PM By: [name] [name] Entered By: [name] [name] on 06/09/2022 10:38:35 [name], [name] Jew ([id number]) [phone].pdf Page 3 of 8 -------------------------------------------------------------------------------- Encounter Discharge Information Details Patient Name: Date of Service: [name] [name], [name] [name]. 06/09/2022 10:15 [name] [name] Medical Record  Number: [id number] Patient Account Number: [id number] Date of Birth/Sex: Treating [name]: 1945-11-25 (77 y.o. [name] [name]) [name] [name] Primary Care Ameya Kutz: [name] [name], [facility] Other Clinician: Referring Dinia Joynt: Treating Leonidus Rowand/Extender: [name] [name], [name] [name]: 8 Encounter Discharge Information Items Discharge Condition: Stable Ambulatory Status: Ambulatory Discharge Destination: Home Transportation: Private Auto Accompanied By: self Schedule Follow-up Appointment: Yes Clinical Summary of Care: Electronic Signature(s) Signed: 06/10/2022 4:28:11 PM By: [name] [name] Entered By: [name] [name] on 06/09/2022 10:39:08 -------------------------------------------------------------------------------- Lower Extremity Assessment Details Patient Name: Date of Service: [name] [name] MES [name]. 06/09/2022 10:15 [name] [name] Medical Record Number: [id number] Patient Account Number: [id number] Date of Birth/Sex: Treating [name]: 26-Apr-1945 (76 y.o. [name] [name]) [name] [name] Primary Care Abner Ardis: [name] [name], [facility] Other Clinician: Referring Cinthia Rodden: Treating Joyanna Kleman/Extender: Baruch Gouty NKLIN, [name] [name]: 8 Edema Assessment Assessed: [Left: No] [Right: No] Edema: [Left: Ye] [Right: s] Calf Left: Right: Point of Measurement: 32 cm From Medial Instep 38.5 cm Ankle Left: Right: Point of Measurement: 10 cm From Medial Instep 24 cm Vascular Assessment Pulses: Dorsalis Pedis Palpable: [Right:Yes] Electronic Signature(s) Signed: 06/10/2022 4:28:11 PM By: [name] [name] Entered By: [name] [name] on 06/09/2022 10:29:08 -------------------------------------------------------------------------------- Multi Wound Chart Details Patient Name: Date of Service: [name] [name] MES [name]. 06/09/2022 10:15 [name] [name] Medical Record Number: [id number] Patient Account Number: [id number] Date of Birth/Sex: Treating [name]: [birth year] (76 y.o. Melonie [place] Primary Care Leba Tibbitts: [name] [name], [facility] Other  Clinician: Referring Stephanine Reas: Treating Valor Quaintance/Extender: [name] [name], [name] [name]: [address] THERIN, VETSCH [name] ([id number]) [id number] Page 4 of 8 Vital Signs Height(in): 67 Pulse(bpm): 60 Weight(lbs): 224 Blood Pressure(mmHg): 129/70 Body Mass Index(BMI): 35.1 Temperature(F): 97.7 Respiratory Rate(breaths/min): 18 [1:Photos:] [[name]/[name]:[name]/[name]] Right, Medial Lower Leg [name]/[name] [name]/[name] Wound Location: Gradually Appeared [name]/[name] [name]/[name] Wounding Event: Diabetic Wound/Ulcer of the Lower [name]/[name] [name]/[name]  Primary Etiology: Extremity Coronary Artery Disease, [name]/[name] [name]/[name] Comorbid History: Hypertension, Myocardial Infarction, Type II Diabetes 09/29/2021 [name]/[name] [name]/[name] Date Acquired: 8 [name]/[name] [name]/[name] Weeks of [name]: Open [name]/[name] [name]/[name] Wound Status: No [name]/[name] [name]/[name] Wound Recurrence: 0.6x0.5x0.1 [name]/[name] [name]/[name] Measurements L x W x D (cm) 0.236 [name]/[name] [name]/[name] [name] (cm) : rea 0.024 [name]/[name] [name]/[name] Volume (cm) : 69.90% [name]/[name] [name]/[name] % Reduction in [name] rea: 69.60% [name]/[name] [name]/[name] % Reduction in Volume: Grade 1 [name]/[name] [name]/[name] Classification: Medium [name]/[name] [name]/[name] Exudate [name] mount: Serosanguineous [name]/[name] [name]/[name] Exudate Type: red, brown [name]/[name] [name]/[name] Exudate Color: Medium (34-66%) [name]/[name] [name]/[name] Granulation [name] mount: Red, Pink [name]/[name] [name]/[name] Granulation Quality: Medium (34-66%) [name]/[name] [name]/[name] Necrotic [name] mount: Fat Layer (Subcutaneous Tissue): Yes [name]/[name] [name]/[name] Exposed Structures: Fascia: No Tendon: No Muscle: No Joint: No Bone: No None [name]/[name] [name]/[name] Epithelialization: [name] Notes Electronic Signature(s) Signed: 06/10/2022 4:28:11 PM By: [name] [name] Entered By: [name] [name] on 06/09/2022 10:29:16 -------------------------------------------------------------------------------- Multi-Disciplinary Care Plan Details Patient Name: Date of Service: [name] [name] MES [name]. 06/09/2022 10:15 [name] [name] Medical Record Number: [id number] Patient Account Number: [id number] Date of Birth/Sex: Treating [name]: [birth year] (76 y.o. Melonie [place] Primary Care Israella Hubert: [name] [name], [facility] Other  Clinician: Referring Camp Gopal: Treating Niaja Stickley/Extender: Baruch Gouty NKLIN, [name] [name]: 8 Active Inactive Wound/Skin Impairment Nursing Diagnoses: Knowledge deficit related to ulceration/compromised skin integrity QUANELL, WINDOM [name] ([id number]) [phone].pdf Page 5 of 8 Goals: Patient/caregiver will verbalize understanding of skin care regimen Date Initiated: 04/14/2022 Target Resolution Date: 07/13/2022 Goal Status: Active Ulcer/skin breakdown will have [name] volume reduction of 30% by week 4 Date Initiated: 04/14/2022 Date Inactivated: 06/09/2022 Target Resolution Date: 05/13/2022 Goal Status: Unmet Unmet Reason: comorbidities Ulcer/skin breakdown will have [name] volume reduction of 50% by week 8 Date Initiated: 04/14/2022 Target Resolution Date: 06/13/2022 Goal Status: Active Ulcer/skin breakdown will have [name] volume reduction of 80% by week 12 Date Initiated: 04/14/2022 Target Resolution Date: 07/13/2022 Goal Status: Active Ulcer/skin breakdown will heal within 14 weeks Date Initiated: 04/14/2022 Target Resolution Date: 08/13/2022 Goal Status: Active Interventions: Assess patient/caregiver ability to obtain necessary supplies Assess patient/caregiver ability to perform ulcer/skin care regimen upon admission and as needed Assess ulceration(s) every visit Notes: Electronic Signature(s) Signed: 06/10/2022 4:28:11 PM By: [name] [name] Entered By: [name] [name] on 06/09/2022 10:30:38 -------------------------------------------------------------------------------- Pain Assessment Details Patient Name: Date of Service: [name] [name] MES [name]. 06/09/2022 10:15 [name] [name] Medical Record Number: [id number] Patient Account Number: [id number] Date of Birth/Sex: Treating [name]: August 06, 1945 (76 y.o. [name] [name]) [name] [name] Primary Care Carisha Kantor: [name] [name], [facility] Other Clinician: Referring Breandan People: Treating Crissa Sowder/Extender: Baruch Gouty NKLIN, [name] [name]:  8 Active Problems Location of Pain Severity and Description of Pain Patient Has Paino No Site Locations Pain Management and Medication Current Pain Management: Electronic Signature(s) Signed: 06/10/2022 4:28:11 PM By: [name] [name] Entered By: [name] [name] on 06/09/2022 10:25:26 Moose, [name] Jew ([id number]) [id number] Page 6 of 8 -------------------------------------------------------------------------------- Patient/Caregiver Education Details Patient Name: Date of Service: [name] [name], [name] [name]. 4/10/2024andnbsp10:15 [name] [name] Medical Record Number: [id number] Patient Account Number: [id number] Date of Birth/Gender: Treating [name]: 1946/02/04 (76 y.o. [name] [name]) [name] [name] Primary Care Physician: [name] [name], [facility] Other Clinician: Referring Physician: Treating Physician/Extender: [name] [name], [name] [name]: 8 Education Assessment Education Provided To: Patient Education Topics Provided Wound Debridement: Handouts: Wound Debridement Methods: Explain/Verbal Responses: State content correctly Wound/Skin Impairment: Handouts: Caring for Your Ulcer Methods: Explain/Verbal Responses: State content correctly Electronic Signature(s) Signed: 06/10/2022 4:28:11 PM By: [name] [name] Entered By: Jettie Pagan  Carrie on 06/09/2022 10:30:59 -------------------------------------------------------------------------------- Wound Assessment Details Patient Name: Date of Service: [name] DeputyCHEEK, [name] [name]. 06/09/2022 10:15 [name] [name] Medical Record Number: [id number] Patient Account Number: [id number] Date of Birth/Sex: Treating [name]: 1946-01-09 (76 y.o. [name] PetitM) [name] PaxEpps, Carrie Primary Care Barnes Florek: [name] CoopFRA NKLIN, [name] Other Clinician: Referring Fiora Weill: Treating Teja Judice/Extender: Baruch GoutyHoffman, Jessica FRA NKLIN, [name] [name]: 8 Wound Status Wound Number: 1 Primary Diabetic Wound/Ulcer of the Lower Extremity Etiology: Wound Location: Right, Medial Lower Leg Wound Open Wounding  Event: Gradually Appeared Status: Date Acquired: 09/29/2021 Comorbid Coronary Artery Disease, Hypertension, Myocardial Infarction, Weeks Of [name]: 8 History: Type II Diabetes Clustered Wound: No Photos Wound Measurements Length: (cm) 0.6 Width: (cm) 0.5 Depth: (cm) 0.1 Area: (cm) 0.2 Volume: (cm) 0.0 Bourbeau, Kwadwo [name] ([id number]) Wound Description Classification: Grade 1 Exudate Amount: Medium Exudate Type: Serosanguineous Exudate Color: red, brown Foul Odor After Cleansing: Slough/Fibrino % Reduction in Area: 69.9% % Reduction in Volume: 69.6% Epithelialization: None 36 Tunneling: No 24 Undermining: No ([id number] [id number] Page 7 of 8 No Yes Wound Bed Granulation Amount: Medium (34-66%) Exposed Structure Granulation Quality: Red, Pink Fascia Exposed: No Necrotic Amount: Medium (34-66%) Fat Layer (Subcutaneous Tissue) Exposed: Yes Necrotic Quality: Adherent Slough Tendon Exposed: No Muscle Exposed: No Joint Exposed: No Bone Exposed: No [name] Notes Wound #1 (Lower Leg) Wound Laterality: Right, Medial Cleanser Soap and Water Discharge Instruction: Gently cleanse wound with antibacterial soap, rinse and pat dry prior to dressing wounds Wound Cleanser Discharge Instruction: Wash your hands with soap and water. Remove old dressing, discard into plastic bag and place into trash. Cleanse the wound with Wound Cleanser prior to applying [name] clean dressing using gauze sponges, not tissues or cotton balls. Do not scrub or use excessive force. Pat dry using gauze sponges, not tissue or cotton balls. Peri-Wound Care Topical Primary Dressing Hydrofera Blue Ready Transfer Foam, 2.5x2.5 (in/in) Discharge Instruction: Apply Hydrofera Blue Ready to wound bed as directed Secondary Dressing ABD Pad 5x9 (in/in) Discharge Instruction: Cover with ABD pad Secured With Compression Wrap Compression Stockings Add-Ons Electronic Signature(s) Signed: 06/10/2022  4:28:11 PM By: [name] PaxEpps, Carrie [name] Entered By: [name] PaxEpps, Carrie on 06/09/2022 10:28:06 -------------------------------------------------------------------------------- Vitals Details Patient Name: Date of Service: [name] DeputyHEEK, [name] [name]. 06/09/2022 10:15 [name] [name] Medical Record Number: [id number] Patient Account Number: [id number] Date of Birth/Sex: Treating [name]: 1946-01-09 (76 y.o. [name] PetitM) [name] PaxEpps, Carrie Primary Care Samai Corea: [name] CoopFRA NKLIN, [name] Other Clinician: Referring Olivia Royse: Treating Bessy Reaney/Extender: Baruch GoutyHoffman, Jessica FRA NKLIN, [name] [name]: 8 Vital Signs Time Taken: 10:25 Temperature (F): 97.7 Height (in): 67 Pulse (bpm): 60 Weight (lbs): 224 Respiratory Rate (breaths/min): 18 Body Mass Index (BMI): 35.1 Blood Pressure (mmHg): 129/70 Reference Range: 80 - 120 mg / dl Electronic Signature(s) Signed: 06/10/2022 4:28:11 PM By: [name] PaxEpps, Carrie [name] Entered By: [name] PaxEpps, Carrie on 06/09/2022 10:25:20 Bonano, [name] JewJAMES [name] ([id number]) [id number] [id number] Page 8 of 8

## 2022-06-10 NOTE — Progress Notes (Signed)
NUR, KRASINSKI ([id number]) [id number] Page 1 of 6 Visit Report for 06/09/2022 Chief Complaint Document Details Patient Name: Date of Service: [name], [name] MES [name]. 06/09/2022 10:15 [name] Medical Record Number: [id number] Patient Account Number: [id number] Date of Birth/Sex: Treating RN: 02-Sep-1945 (76 y.o. [name]) [name] Primary Care Provider: Jason [name], [facility] Other Clinician: Referring Provider: Treating Provider/Extender: [name], [name] Weeks in Treatment: 8 Information Obtained from: Patient Chief Complaint 04/14/2022; right lower extremity wound Electronic Signature(s) Signed: 06/09/2022 11:59:33 AM By: Geralyn Corwin DO Entered By: Geralyn Corwin on 06/09/2022 10:39:13 -------------------------------------------------------------------------------- HPI Details Patient Name: Date of Service: [name] MES [name]. 06/09/2022 10:15 [name] Medical Record Number: [id number] Patient Account Number: [id number] Date of Birth/Sex: Treating RN: [birth year] (76 y.o. [name]) [name] Primary Care Provider: Jason [name], [facility] Other Clinician: Referring Provider: Treating Provider/Extender: [name], [name] Weeks in Treatment: 8 History of Present Illness HPI Description: 04/14/2022 [name] is [name] with [name] past medical history of diet-controlled type 2 diabetes, OSA, venous insufficiency and CAD that presents to the clinic for [name] to the right lower extremity. He states the wound started spontaneously and he has been using Neosporin to the area. He wears compression stockings daily. He currently denies signs of infection. He denies pain to the site. The wound has remained stable over the past 6 months. ABIs in office were 1.08 on the right. 2/21; patient presents for follow-up. He did not tolerate the compression wrap well. He states it became too tight at the top and he did cut into it to  relieve some of the pressure. He denies signs of infection. 2/28; patient presents for follow-up. He has been using Medihoney and Hydrofera Blue to the wound bed under Tubigrip. He has no issues or complaints today. 3/11; patient presents for follow-up. He has been using Medihoney and Hydrofera Blue to the wound bed under Tubigrip. He has no issues or complaints today. There is been improvement in wound healing. 3/20; patient presents for follow-up. He has been using Medihoney and Hydrofera Blue to the wound bed. Unfortunately he has too much trouble putting on his compression stockings and has not been using them. 3/27; patient presents for follow-up. We have been using Medihoney and Hydrofera Blue to the wound bed. Patient states the Medihoney is causing burning pain. He has been using Tubigrip for compression. 4/10; patient presents for follow-up. He has been using Hydrofera Blue and his own compression stockings daily. There is been improvement in wound healing. Electronic Signature(s) Signed: 06/09/2022 11:59:33 AM By: Geralyn Corwin DO Entered By: Geralyn Corwin on 06/09/2022 10:40:12 -------------------------------------------------------------------------------- Physical Exam Details Patient Name: Date of Service: [name] MES [name]. 06/09/2022 10:15 [name] Medical Record Number: [id number] Patient Account Number: [id number] Date of Birth/Sex: Treating RN: [birth year] (76 y.o. [name]) [name] Primary Care Provider: Jason [name], [facility] Other Clinician: Referring Provider: Treating Provider/Extender: [name], [name] Weeks in Treatment: [address] [place] [name] ([id number]) [id number] Page 2 of 6 Constitutional . Cardiovascular . Psychiatric . Notes Right lower extremity: 2+ pitting edema to the knee. Circular open wound to the anterior aspect with granulation tissue. No signs of surrounding infection. Venous stasis dermatitis. Varicose veins around  the ankle. Electronic Signature(s) Signed: 06/09/2022 11:59:33 AM By: Geralyn Corwin DO Entered By: Geralyn Corwin on 06/09/2022 10:41:13 -------------------------------------------------------------------------------- Physician Orders Details Patient Name: Date of Service: [name] MES [name]. 06/09/2022 10:15 [name] Medical Record Number:  [id number] Patient Account Number: [id number] Date of Birth/Sex: Treating RN: [birth year] (76 y.o. [name]) [name] Primary Care Provider: Jason [name], [facility] Other Clinician: Referring Provider: Treating Provider/Extender: [name], [name] Weeks in Treatment: 8 Verbal / Phone Orders: No Diagnosis Coding Follow-up Appointments Return Appointment in 2 weeks. Bathing/ Shower/ Hygiene May shower with wound dressing protected with water repellent cover or cast protector. Anesthetic (Use 'Patient Medications' Section for Anesthetic Order Entry) Lidocaine applied to wound bed Edema Control - Lymphedema / Segmental Compressive Device / Other Patient to wear own compression stockings. Remove compression stockings every night before going to bed and put on every morning when getting up. - bilat Elevate, Exercise Daily and [name] void Standing for Long Periods of Time. Elevate legs to the level of the heart and pump ankles as often as possible Elevate leg(s) parallel to the floor when sitting. Wound Treatment Wound #1 - Lower Leg Wound Laterality: Right, Medial Cleanser: Soap and Water Every Other Day/30 Days Discharge Instructions: Gently cleanse wound with antibacterial soap, rinse and pat dry prior to dressing wounds Cleanser: Wound Cleanser Every Other Day/30 Days Discharge Instructions: Wash your hands with soap and water. Remove old dressing, discard into plastic bag and place into trash. Cleanse the wound with Wound Cleanser prior to applying [name] clean dressing using gauze sponges, not tissues or cotton balls. Do not scrub or use excessive force.  Pat dry using gauze sponges, not tissue or cotton balls. Prim Dressing: Hydrofera Blue Ready Transfer Foam, 2.5x2.5 (in/in) (Dispense As Written) Every Other Day/30 Days ary Discharge Instructions: Apply Hydrofera Blue Ready to wound bed as directed Secondary Dressing: ABD Pad 5x9 (in/in) (Generic) Every Other Day/30 Days Discharge Instructions: Cover with ABD pad Electronic Signature(s) Signed: 06/09/2022 11:59:33 AM By: Geralyn Corwin DO Entered By: Geralyn Corwin on 06/09/2022 10:43:34 Problem List Details -------------------------------------------------------------------------------- [name] ([id number]) [id number] Page 3 of 6 Patient Name: Date of Service: [name], [name] MES [name]. 06/09/2022 10:15 [name] Medical Record Number: [id number] Patient Account Number: [id number] Date of Birth/Sex: Treating RN: 10-20-1945 (76 y.o. [name]) [name] Primary Care Provider: Jason [name], [facility] Other Clinician: Referring Provider: Treating Provider/Extender: [name], [name] Weeks in Treatment: 8 Active Problems ICD-10 Encounter Code Description Active Date MDM Diagnosis L97.812 Non-pressure chronic ulcer of other part of right lower leg with fat layer 04/14/2022 No Yes exposed I87.311 Chronic venous hypertension (idiopathic) with ulcer of right lower extremity 04/14/2022 No Yes E11.622 Type 2 diabetes mellitus with other skin ulcer 04/14/2022 No Yes G47.33 Obstructive sleep apnea (adult) (pediatric) 04/14/2022 No Yes Inactive Problems Resolved Problems Electronic Signature(s) Signed: 06/09/2022 11:59:33 AM By: Geralyn Corwin DO Entered By: Geralyn Corwin on 06/09/2022 10:39:09 -------------------------------------------------------------------------------- Progress Note Details Patient Name: Date of Service: [name] MES [name]. 06/09/2022 10:15 [name] Medical Record Number: [id number] Patient Account Number: [id number] Date of Birth/Sex: Treating  RN: 1945-07-26 (76 y.o. [name]) [name] Primary Care Provider: Jason [name], [facility] Other Clinician: Referring Provider: Treating Provider/Extender: [name], [name] Weeks in Treatment: 8 Subjective Chief Complaint Information obtained from Patient 04/14/2022; right lower extremity wound History of Present Illness (HPI) 04/14/2022 Mr. Ahmir Delangel is [name] with [name] past medical history of diet-controlled type 2 diabetes, OSA, venous insufficiency and CAD that presents to the clinic for [name] to the right lower extremity. He states the wound started spontaneously and he has been using Neosporin to the area. He wears compression stockings daily. He  currently denies signs of infection. He denies pain to the site. The wound has remained stable over the past 6 months. ABIs in office were 1.08 on the right. 2/21; patient presents for follow-up. He did not tolerate the compression wrap well. He states it became too tight at the top and he did cut into it to relieve some of the pressure. He denies signs of infection. 2/28; patient presents for follow-up. He has been using Medihoney and Hydrofera Blue to the wound bed under Tubigrip. He has no issues or complaints today. 3/11; patient presents for follow-up. He has been using Medihoney and Hydrofera Blue to the wound bed under Tubigrip. He has no issues or complaints today. There is been improvement in wound healing. 3/20; patient presents for follow-up. He has been using Medihoney and Hydrofera Blue to the wound bed. Unfortunately he has too much trouble putting on his compression stockings and has not been using them. 3/27; patient presents for follow-up. We have been using Medihoney and Hydrofera Blue to the wound bed. Patient states the Medihoney is causing burning pain. He has been using Tubigrip for compression. [name], [name] ([id number]) [id number] Page 4 of  6 4/10; patient presents for follow-up. He has been using Hydrofera Blue and his own compression stockings daily. There is been improvement in wound healing. Objective Constitutional Vitals Time Taken: 10:25 AM, Height: 67 in, Weight: 224 lbs, BMI: 35.1, Temperature: 97.7 F, Pulse: 60 bpm, Respiratory Rate: 18 breaths/min, Blood Pressure: 129/70 mmHg. General Notes: Right lower extremity: 2+ pitting edema to the knee. Circular open wound to the anterior aspect with granulation tissue. No signs of surrounding infection. Venous stasis dermatitis. Varicose veins around the ankle. Integumentary (Hair, Skin) Wound #1 status is Open. Original cause of wound was Gradually Appeared. The date acquired was: 09/29/2021. The wound has been in treatment 8 weeks. The wound is located on the Right,Medial Lower Leg. The wound measures 0.6cm length x 0.5cm width x 0.1cm depth; 0.236cm^2 area and 0.024cm^3 volume. There is Fat Layer (Subcutaneous Tissue) exposed. There is no tunneling or undermining noted. There is [name] medium amount of serosanguineous drainage noted. There is medium (34-66%) red, pink granulation within the wound bed. There is [name] medium (34-66%) amount of necrotic tissue within the wound bed including Adherent Slough. Assessment Active Problems ICD-10 Non-pressure chronic ulcer of other part of right lower leg with fat layer exposed Chronic venous hypertension (idiopathic) with ulcer of right lower extremity Type 2 diabetes mellitus with other skin ulcer Obstructive sleep apnea (adult) (pediatric) Patient's wound has shown improvement in size in appearance since last clinic visit. I recommended continuing the course with Hydrofera Blue under compression stocking. Plan Follow-up Appointments: Return Appointment in 2 weeks. Bathing/ Shower/ Hygiene: May shower with wound dressing protected with water repellent cover or cast protector. Anesthetic (Use 'Patient Medications' Section for Anesthetic  Order Entry): Lidocaine applied to wound bed Edema Control - Lymphedema / Segmental Compressive Device / Other: Patient to wear own compression stockings. Remove compression stockings every night before going to bed and put on every morning when getting up. - bilat Elevate, Exercise Daily and Avoid Standing for Long Periods of Time. Elevate legs to the level of the heart and pump ankles as often as possible Elevate leg(s) parallel to the floor when sitting. WOUND #1: - Lower Leg Wound Laterality: Right, Medial Cleanser: Soap and Water Every Other Day/30 Days Discharge Instructions: Gently cleanse wound with antibacterial soap, rinse and pat dry prior to dressing wounds Cleanser:  Wound Cleanser Every Other Day/30 Days Discharge Instructions: Wash your hands with soap and water. Remove old dressing, discard into plastic bag and place into trash. Cleanse the wound with Wound Cleanser prior to applying [name] clean dressing using gauze sponges, not tissues or cotton balls. Do not scrub or use excessive force. Pat dry using gauze sponges, not tissue or cotton balls. Prim Dressing: Hydrofera Blue Ready Transfer Foam, 2.5x2.5 (in/in) (Dispense As Written) Every Other Day/30 Days ary Discharge Instructions: Apply Hydrofera Blue Ready to wound bed as directed Secondary Dressing: ABD Pad 5x9 (in/in) (Generic) Every Other Day/30 Days Discharge Instructions: Cover with ABD pad 1. Hydrofera Blue 2. Compression stockings 3. Follow-up in 2 weeks Electronic Signature(s) Signed: 06/09/2022 11:59:33 AM By: Demetrius RevelHoffman, Chinonso Linker DO [name], [name] ([id number]) [id number] Page 5 of 6 Entered By: Geralyn CorwinHoffman, Kaeden Mester on 06/09/2022 10:43:10 -------------------------------------------------------------------------------- ROS/PFSH Details Patient Name: Date of Service: [name] DeputyCHEEK, [name] MES [name]. 06/09/2022 10:15 [name] Medical Record Number: [id number] Patient Account Number: [id number] Date of Birth/Sex:  Treating RN: 07-Jun-1945 (76 y.o. [name] PetitM) Yevonne PaxEpps, Carrie Primary Care Provider: Jason CoopFRA NKLIN, [facility] Other Clinician: Referring Provider: Treating Provider/Extender: [name] BinetHoffman, Prince Olivier FRA NKLIN, [name] Weeks in Treatment: 8 Information Obtained From Patient Cardiovascular Medical History: Positive for: Coronary Artery Disease; Hypertension; Myocardial Infarction Endocrine Medical History: Positive for: Type II Diabetes Time with diabetes: 5 Immunizations Pneumococcal Vaccine: Received Pneumococcal Vaccination: Yes Received Pneumococcal Vaccination On or After 60th Birthday: Yes Implantable Devices None Family and Social History Former smoker; Marital Status - Married; Alcohol Use: Never; Drug Use: No History; Caffeine Use: Never Electronic Signature(s) Signed: 06/09/2022 11:59:33 AM By: Geralyn CorwinHoffman, Contrina Orona DO Signed: 06/10/2022 4:28:11 PM By: Yevonne PaxEpps, Carrie RN Entered By: Geralyn CorwinHoffman, Karren Newland on 06/09/2022 10:43:44 -------------------------------------------------------------------------------- SuperBill Details Patient Name: Date of Service: [name] DeputyHEEK, [name] MES [name]. 06/09/2022 Medical Record Number: [id number] Patient Account Number: [id number] Date of Birth/Sex: Treating RN: 07-Jun-1945 (76 y.o. [name] PetitM) Yevonne PaxEpps, Carrie Primary Care Provider: Jason CoopFRA NKLIN, [name] Other Clinician: Referring Provider: Treating Provider/Extender: [name] BinetHoffman, Keysi Oelkers FRA NKLIN, [name] Weeks in Treatment: 8 Diagnosis Coding ICD-10 Codes Code Description [phone] Non-pressure chronic ulcer of other part of right lower leg with fat layer exposed I87.311 Chronic venous hypertension (idiopathic) with ulcer of right lower extremity E11.622 Type 2 diabetes mellitus with other skin ulcer G47.33 Obstructive sleep apnea (adult) (pediatric) Facility Procedures : 7 CPT4 Code: [id number] Description: [id number] - WOUND CARE VISIT-LEV 2 EST PT Modifier: Quantity: 1 Physician Procedures [name] PeeCHEEK, [name] ([id number]): CPT4 Code Description [id number] - WC PHYS  LEVEL 3 - EST PT ICD-10 Diagnosis Description L97.812 Non-pressure chronic ulcer of other part of right lower leg with f I87.311 Chronic venous hypertension (idiopathic) with ulcer  of right lower E11.622 Type 2 diabetes mellitus with other skin ulcer G47.33 Obstructive sleep apnea (adult) (pediatric) [id number] Page 6 of 6: Quantity Modifier 1 at layer exposed extremity Electronic Signature(s) Signed: 06/09/2022 11:59:33 AM By: Geralyn CorwinHoffman, Drevin Ortner DO Entered By: Geralyn CorwinHoffman, Arzella Rehmann on 06/09/2022 10:43:27

## 2022-06-23 ENCOUNTER — Encounter (HOSPITAL_BASED_OUTPATIENT_CLINIC_OR_DEPARTMENT_OTHER): Payer: Medicare HMO | Admitting: Internal Medicine

## 2022-06-23 DIAGNOSIS — I87311 Chronic venous hypertension (idiopathic) with ulcer of right lower extremity: Secondary | ICD-10-CM | POA: Diagnosis not present

## 2022-06-23 DIAGNOSIS — G4733 Obstructive sleep apnea (adult) (pediatric): Secondary | ICD-10-CM

## 2022-06-23 DIAGNOSIS — E11622 Type 2 diabetes mellitus with other skin ulcer: Secondary | ICD-10-CM

## 2022-06-23 DIAGNOSIS — L97812 Non-pressure chronic ulcer of other part of right lower leg with fat layer exposed: Secondary | ICD-10-CM | POA: Diagnosis not present

## 2022-06-25 NOTE — Progress Notes (Signed)
GRANVIL, DJORDJEVIC [name] ([id number]) [id number] Page 1 of 7 Visit Report for 06/23/2022 Arrival Information Details Patient Name: Date of Service: [name], [name] MES [name]. 06/23/2022 11:15 [name] Medical Record Number: [id number] Patient Account Number: [id number] Date of Birth/Sex: Treating RN: 23-Apr-1945 (76 y.o. [name]) [name] Primary Care Hanadi Stanly: Jason Coop, EMILY Other Clinician: Referring Erlene Devita: Treating Rheta Hemmelgarn/Extender: Oren Binet, EMILY Weeks in Treatment: 10 Visit Information History Since Last Visit Added or deleted any medications: No Patient Arrived: Walker Any new allergies or adverse reactions: No Arrival Time: 11:18 Had [name] fall or experienced change in No Accompanied By: wife activities of daily living that may affect Transfer Assistance: None risk of falls: Patient Identification Verified: Yes Signs or symptoms of abuse/neglect since last visito No Secondary Verification Process Completed: Yes Hospitalized since last visit: No Patient Requires Transmission-Based Precautions: No Implantable device outside of the clinic excluding No Patient Has Alerts: Yes cellular tissue based products placed in the center Patient Alerts: Patient on Blood Thinner since last visit: Baby ASA 81mg  Has Dressing in Place as Prescribed: Yes Pain Present Now: No Electronic Signature(s) Signed: 06/24/2022 3:34:54 PM By: [name] RN Entered By: [name] on 06/23/2022 11:18:39 -------------------------------------------------------------------------------- Clinic Level of Care Assessment Details Patient Name: Date of Service: [name], [name] MES [name]. 06/23/2022 11:15 [name] Medical Record Number: [id number] Patient Account Number: [id number] Date of Birth/Sex: Treating RN: 07-Sep-1945 (76 y.o. [name]) [name] Primary Care Kainalu Heggs: Jason Coop, [facility] Other Clinician: Referring Evelen Vazguez: Treating Kyrie Fludd/Extender: Baruch Gouty NKLIN, EMILY Weeks in Treatment:  10 Clinic Level of Care Assessment Items TOOL 4 Quantity Score X- 1 0 Use when only an EandM is performed on FOLLOW-UP visit ASSESSMENTS - Nursing Assessment / Reassessment []  - 0 Reassessment of Co-morbidities (includes updates in patient status) []  - 0 Reassessment of Adherence to Treatment Plan ASSESSMENTS - Wound and Skin [name] ssessment / Reassessment X - Simple Wound Assessment / Reassessment - one wound 1 5 []  - 0 Complex Wound Assessment / Reassessment - multiple wounds []  - 0 Dermatologic / Skin Assessment (not related to wound area) ASSESSMENTS - Focused Assessment []  - 0 Circumferential Edema Measurements - multi extremities []  - 0 Nutritional Assessment / Counseling / Intervention []  - 0 Lower Extremity Assessment (monofilament, tuning fork, pulses) []  - 0 Peripheral Arterial Disease Assessment (using hand held doppler) ASSESSMENTS - Ostomy and/or Continence Assessment and Care []  - 0 Incontinence Assessment and Management []  - 0 Ostomy Care Assessment and Management (repouching, etc.) PROCESS - Coordination of Care X - Simple Patient / Family Education for ongoing care 1 [address] [name] ([id number]) [id number] Page 2 of 7 []  - 0 Complex (extensive) Patient / Family Education for ongoing care []  - 0 Staff obtains Chiropractor, Records, T Results / Process Orders est []  - 0 Staff telephones HHA, Nursing Homes / Clarify orders / etc []  - 0 Routine Transfer to another Facility (non-emergent condition) []  - 0 Routine Hospital Admission (non-emergent condition) []  - 0 New Admissions / Manufacturing engineer / Ordering NPWT Apligraf, etc. , []  - 0 Emergency Hospital Admission (emergent condition) X- 1 10 Simple Discharge Coordination []  - 0 Complex (extensive) Discharge Coordination PROCESS - Special Needs []  - 0 Pediatric / Minor Patient Management []  - 0 Isolation Patient Management []  - 0 Hearing / Language / Visual special  needs []  - 0 Assessment of Community assistance (transportation, D/C planning, etc.) []  - 0 Additional assistance / Altered mentation []  - 0 Support Surface(s) Assessment (bed,  cushion, seat, etc.) INTERVENTIONS - Wound Cleansing / Measurement X - Simple Wound Cleansing - one wound 1 5 []  - 0 Complex Wound Cleansing - multiple wounds X- 1 5 Wound Imaging (photographs - any number of wounds) []  - 0 Wound Tracing (instead of photographs) X- 1 5 Simple Wound Measurement - one wound []  - 0 Complex Wound Measurement - multiple wounds INTERVENTIONS - Wound Dressings X - Small Wound Dressing one or multiple wounds 1 10 []  - 0 Medium Wound Dressing one or multiple wounds []  - 0 Large Wound Dressing one or multiple wounds []  - 0 Application of Medications - topical []  - 0 Application of Medications - injection INTERVENTIONS - Miscellaneous []  - 0 External ear exam []  - 0 Specimen Collection (cultures, biopsies, blood, body fluids, etc.) []  - 0 Specimen(s) / Culture(s) sent or taken to Lab for analysis []  - 0 Patient Transfer (multiple staff / Nurse, adult / Similar devices) []  - 0 Simple Staple / Suture removal (25 or less) []  - 0 Complex Staple / Suture removal (26 or more) []  - 0 Hypo / Hyperglycemic Management (close monitor of Blood Glucose) []  - 0 Ankle / Brachial Index (ABI) - do not check if billed separately X- 1 5 Vital Signs Has the patient been seen at the hospital within the last three years: Yes Total Score: 60 Level Of Care: New/Established - Level 2 Electronic Signature(s) Signed: 06/24/2022 3:34:54 PM By: [name] RN Entered By: [name] on 06/23/2022 11:34:55 Brouwer, [name] ([id number]) [id number] Page 3 of 7 -------------------------------------------------------------------------------- Encounter Discharge Information Details Patient Name: Date of Service: [name], [name] MES [name]. 06/23/2022 11:15 [name] Medical Record Number:  [id number] Patient Account Number: [id number] Date of Birth/Sex: Treating RN: 1945/06/04 (76 y.o. [name]) [name] Primary Care Raeana Blinn: Jason Coop, [facility] Other Clinician: Referring Sydnei Ohaver: Treating Jaylah Goodlow/Extender: Oren Binet, EMILY Weeks in Treatment: 10 Encounter Discharge Information Items Discharge Condition: Stable Ambulatory Status: Walker Discharge Destination: Home Transportation: Private Auto Accompanied By: wife Schedule Follow-up Appointment: Yes Clinical Summary of Care: Electronic Signature(s) Signed: 06/23/2022 11:37:07 AM By: [name] RN Entered By: [name] on 06/23/2022 11:37:07 -------------------------------------------------------------------------------- Lower Extremity Assessment Details Patient Name: Date of Service: [name], [name] MES [name]. 06/23/2022 11:15 [name] Medical Record Number: [id number] Patient Account Number: [id number] Date of Birth/Sex: Treating RN: August 26, 1945 (76 y.o. [name]) [name] Primary Care Shalan Neault: Jason Coop, [facility] Other Clinician: Referring Nakeitha Milligan: Treating Hennessy Bartel/Extender: Baruch Gouty NKLIN, EMILY Weeks in Treatment: 10 Edema Assessment Assessed: [Left: No] [Right: No] Edema: [Left: Ye] [Right: s] Calf Left: Right: Point of Measurement: 32 cm From Medial Instep 38 cm Ankle Left: Right: Point of Measurement: 10 cm From Medial Instep 24 cm Vascular Assessment Pulses: Dorsalis Pedis Palpable: [Right:Yes] Electronic Signature(s) Signed: 06/24/2022 3:34:54 PM By: [name] RN Entered By: [name] on 06/23/2022 11:25:02 -------------------------------------------------------------------------------- Multi Wound Chart Details Patient Name: Date of Service: [name] MES [name]. 06/23/2022 11:15 [name] Medical Record Number: [id number] Patient Account Number: [id number] Date of Birth/Sex: Treating RN: 1946/02/26 (76 y.o. Melonie [place] Primary Care Ahaana Rochette: Jason Coop, [facility] Other Clinician: Referring  Blasa Raisch: Treating Rhiley Solem/Extender: Oren Binet, EMILY Weeks in Treatment: [address] [name] ([id number]) [id number] Page 4 of 7 Vital Signs Height(in): 67 Pulse(bpm): 77 Weight(lbs): 224 Blood Pressure(mmHg): 130/72 Body Mass Index(BMI): 35.1 Temperature(F): 98.4 Respiratory Rate(breaths/min): 18 [1:Photos:] [N/[name]:N/[name]] Right, Medial Lower Leg N/[name] N/[name] Wound Location: Gradually Appeared N/[name] N/[name] Wounding Event: Diabetic Wound/Ulcer of the Lower N/[name] N/[name]  Primary Etiology: Extremity Coronary Artery Disease, N/[name] N/[name] Comorbid History: Hypertension, Myocardial Infarction, Type II Diabetes 09/29/2021 N/[name] N/[name] Date Acquired: 10 N/[name] N/[name] Weeks of Treatment: Open N/[name] N/[name] Wound Status: No N/[name] N/[name] Wound Recurrence: 0.1x0.1x0.1 N/[name] N/[name] Measurements L x W x D (cm) 0.008 N/[name] N/[name] [name] (cm) : rea 0.001 N/[name] N/[name] Volume (cm) : 99.00% N/[name] N/[name] % Reduction in [name] rea: 98.70% N/[name] N/[name] % Reduction in Volume: Grade 1 N/[name] N/[name] Classification: Medium N/[name] N/[name] Exudate [name] mount: Serosanguineous N/[name] N/[name] Exudate Type: red, brown N/[name] N/[name] Exudate Color: Medium (34-66%) N/[name] N/[name] Granulation [name] mount: Red, Pink N/[name] N/[name] Granulation Quality: Medium (34-66%) N/[name] N/[name] Necrotic [name] mount: Fat Layer (Subcutaneous Tissue): Yes N/[name] N/[name] Exposed Structures: Fascia: No Tendon: No Muscle: No Joint: No Bone: No None N/[name] N/[name] Epithelialization: Treatment Notes Electronic Signature(s) Signed: 06/23/2022 11:33:02 AM By: [name] RN Entered By: [name] on 06/23/2022 11:33:02 -------------------------------------------------------------------------------- Multi-Disciplinary Care Plan Details Patient Name: Date of Service: [name] MES [name]. 06/23/2022 11:15 [name] Medical Record Number: [id number] Patient Account Number: [id number] Date of Birth/Sex: Treating RN: Jul 23, 1945 (76 y.o. Melonie [place] Primary Care Donice Alperin: Jason Coop, [facility] Other Clinician: Referring  Kotaro Buer: Treating Braxton Vantrease/Extender: Baruch Gouty NKLIN, EMILY Weeks in Treatment: 10 Active Inactive Wound/Skin Impairment Nursing Diagnoses: Knowledge deficit related to ulceration/compromised skin integrity AUBERT, CHOYCE [name] ([id number]) [id number] Page 5 of 7 Goals: Patient/caregiver will verbalize understanding of skin care regimen Date Initiated: 04/14/2022 Target Resolution Date: 07/13/2022 Goal Status: Active Ulcer/skin breakdown will have [name] volume reduction of 30% by week 4 Date Initiated: 04/14/2022 Date Inactivated: 06/09/2022 Target Resolution Date: 05/13/2022 Goal Status: Unmet Unmet Reason: comorbidities Ulcer/skin breakdown will have [name] volume reduction of 50% by week 8 Date Initiated: 04/14/2022 Date Inactivated: 06/23/2022 Target Resolution Date: 06/13/2022 Goal Status: Met Ulcer/skin breakdown will have [name] volume reduction of 80% by week 12 Date Initiated: 04/14/2022 Target Resolution Date: 07/13/2022 Goal Status: Active Ulcer/skin breakdown will heal within 14 weeks Date Initiated: 04/14/2022 Target Resolution Date: 08/13/2022 Goal Status: Active Interventions: Assess patient/caregiver ability to obtain necessary supplies Assess patient/caregiver ability to perform ulcer/skin care regimen upon admission and as needed Assess ulceration(s) every visit Notes: Electronic Signature(s) Signed: 06/23/2022 11:35:47 AM By: [name] RN Entered By: [name] on 06/23/2022 11:35:47 -------------------------------------------------------------------------------- Pain Assessment Details Patient Name: Date of Service: [name], [name] MES [name]. 06/23/2022 11:15 [name] Medical Record Number: [id number] Patient Account Number: [id number] Date of Birth/Sex: Treating RN: 04-Aug-1945 (76 y.o. [name]) [name] Primary Care Kano Heckmann: Jason Coop, [facility] Other Clinician: Referring Tanishi Nault: Treating Bayley Hurn/Extender: Baruch Gouty NKLIN, EMILY Weeks in  Treatment: 10 Active Problems Location of Pain Severity and Description of Pain Patient Has Paino No Site Locations Pain Management and Medication Current Pain Management: Electronic Signature(s) Signed: 06/24/2022 3:34:54 PM By: [name] RN Entered By: [name] on 06/23/2022 11:19:28 [name], [name] ([id number]) [id number] Page 6 of 7 -------------------------------------------------------------------------------- Patient/Caregiver Education Details Patient Name: Date of Service: [name], [name] MES [name]. 4/24/2024andnbsp11:15 [name] Medical Record Number: [id number] Patient Account Number: [id number] Date of Birth/Gender: Treating RN: 10/06/1945 (76 y.o. [name]) [name] Primary Care Physician: Jason Coop, [facility] Other Clinician: Referring Physician: Treating Physician/Extender: Oren Binet, EMILY Weeks in Treatment: 10 Education Assessment Education Provided To: Patient Education Topics Provided Wound/Skin Impairment: Handouts: Caring for Your Ulcer Methods: Explain/Verbal Responses: State content correctly Electronic Signature(s) Signed: 06/24/2022 3:34:54 PM By: [name] RN Entered By: [name] on 06/23/2022 11:36:00 -------------------------------------------------------------------------------- Wound Assessment Details  Patient Name: Date of Service: [name], [name] MES [name]. 06/23/2022 11:15 [name] Medical Record Number: [id number] Patient Account Number: [id number] Date of Birth/Sex: Treating RN: 10/19/1945 (76 y.o. [name]) [name] Primary Care Maylin Freeburg: Jason Coop, [facility] Other Clinician: Referring Adelyna Brockman: Treating Laniece Hornbaker/Extender: Baruch Gouty NKLIN, EMILY Weeks in Treatment: 10 Wound Status Wound Number: 1 Primary Diabetic Wound/Ulcer of the Lower Extremity Etiology: Wound Location: Right, Medial Lower Leg Wound Open Wounding Event: Gradually Appeared Status: Date Acquired: 09/29/2021 Comorbid Coronary Artery Disease,  Hypertension, Myocardial Infarction, Weeks Of Treatment: 10 History: Type II Diabetes Clustered Wound: No Photos Wound Measurements Length: (cm) 0.3 Width: (cm) 0.2 Depth: (cm) 0.1 Area: (cm) 0.047 Volume: (cm) 0.005 % Reduction in Area: 94% % Reduction in Volume: 93.7% Epithelialization: None Tunneling: No Undermining: No Wound Description Classification: Grade 1 Exudate Amount: Medium Exudate Type: Serosanguineous [name], [name] ([id number]) Exudate Color: red, brown Foul Odor After Cleansing: No Slough/Fibrino Yes [id number] Page 7 of 7 Wound Bed Granulation Amount: Medium (34-66%) Exposed Structure Granulation Quality: Red, Pink Fascia Exposed: No Necrotic Amount: Medium (34-66%) Fat Layer (Subcutaneous Tissue) Exposed: Yes Tendon Exposed: No Muscle Exposed: No Joint Exposed: No Bone Exposed: No Treatment Notes Wound #1 (Lower Leg) Wound Laterality: Right, Medial Cleanser Soap and Water Discharge Instruction: Gently cleanse wound with antibacterial soap, rinse and pat dry prior to dressing wounds Wound Cleanser Discharge Instruction: Wash your hands with soap and water. Remove old dressing, discard into plastic bag and place into trash. Cleanse the wound with Wound Cleanser prior to applying [name] clean dressing using gauze sponges, not tissues or cotton balls. Do not scrub or use excessive force. Pat dry using gauze sponges, not tissue or cotton balls. Peri-Wound Care Topical Primary Dressing Hydrofera Blue Ready Transfer Foam, 2.5x2.5 (in/in) Discharge Instruction: Apply Hydrofera Blue Ready to wound bed as directed Secondary Dressing Gauze Discharge Instruction: As directed: dry, moistened with saline or moistened with Dakins Solution Secured With Medipore T - 80M Medipore H Soft Cloth Surgical T ape ape, 2x2 (in/yd) Compression Wrap Compression Stockings Add-Ons Electronic Signature(s) Signed: 06/23/2022 12:31:39 PM By: Geralyn Corwin DO Signed: 06/24/2022 3:34:54 PM By: [name] RN Entered By: Geralyn Corwin on 06/23/2022 11:41:37 -------------------------------------------------------------------------------- Vitals Details Patient Name: Date of Service: [name] MES [name]. 06/23/2022 11:15 [name] Medical Record Number: [id number] Patient Account Number: [id number] Date of Birth/Sex: Treating RN: 08-Jan-1946 (76 y.o. [name]) [name] Primary Care Savilla Turbyfill: Jason Coop, EMILY Other Clinician: Referring Siboney Requejo: Treating Perfecto Purdy/Extender: Baruch Gouty NKLIN, EMILY Weeks in Treatment: 10 Vital Signs Time Taken: 11:18 Temperature (F): 98.4 Height (in): 67 Pulse (bpm): 77 Weight (lbs): 224 Respiratory Rate (breaths/min): 18 Body Mass Index (BMI): 35.1 Blood Pressure (mmHg): 130/72 Reference Range: 80 - 120 mg / dl Electronic Signature(s) Signed: 06/24/2022 3:34:54 PM By: [name] RN Entered By: [name] on 06/23/2022 11:19:13

## 2022-06-25 NOTE — Progress Notes (Signed)
CAYDE, HELD ([id number]) [id number] Page 1 of 6 Visit Report for 06/23/2022 Chief Complaint Document Details Patient Name: Date of Service: [name], [name] MES [name]. 06/23/2022 11:15 [name] Medical Record Number: [id number] Patient Account Number: [id number] Date of Birth/Sex: Treating RN: 01-30-1946 (77 y.o. [name]) [name] Primary Care Provider: Jason Coop, [facility] Other Clinician: Referring Provider: Treating Provider/Extender: Oren Binet, EMILY Weeks in Treatment: 10 Information Obtained from: Patient Chief Complaint 04/14/2022; right lower extremity wound Electronic Signature(s) Signed: 06/23/2022 12:31:39 PM By: Geralyn Corwin DO Entered By: Geralyn Corwin on 06/23/2022 11:39:08 -------------------------------------------------------------------------------- HPI Details Patient Name: Date of Service: [name] MES [name]. 06/23/2022 11:15 [name] Medical Record Number: [id number] Patient Account Number: [id number] Date of Birth/Sex: Treating RN: [birth year] (77 y.o. [name]) [name] Primary Care Provider: Jason Coop, [facility] Other Clinician: Referring Provider: Treating Provider/Extender: Oren Binet, EMILY Weeks in Treatment: 10 History of Present Illness HPI Description: 04/14/2022 [name] is [name] with [name] past medical history of diet-controlled type 2 diabetes, OSA, venous insufficiency and CAD that presents to the clinic for [name] to the right lower extremity. He states the wound started spontaneously and he has been using Neosporin to the area. He wears compression stockings daily. He currently denies signs of infection. He denies pain to the site. The wound has remained stable over the past 6 months. ABIs in office were 1.08 on the right. 2/21; patient presents for follow-up. He did not tolerate the compression wrap well. He states it became too tight at the top and he did cut into it to  relieve some of the pressure. He denies signs of infection. 2/28; patient presents for follow-up. He has been using Medihoney and Hydrofera Blue to the wound bed under Tubigrip. He has no issues or complaints today. 3/11; patient presents for follow-up. He has been using Medihoney and Hydrofera Blue to the wound bed under Tubigrip. He has no issues or complaints today. There is been improvement in wound healing. 3/20; patient presents for follow-up. He has been using Medihoney and Hydrofera Blue to the wound bed. Unfortunately he has too much trouble putting on his compression stockings and has not been using them. 3/27; patient presents for follow-up. We have been using Medihoney and Hydrofera Blue to the wound bed. Patient states the Medihoney is causing burning pain. He has been using Tubigrip for compression. 4/10; patient presents for follow-up. He has been using Hydrofera Blue and his own compression stockings daily. There is been improvement in wound healing. 4/24; patient presents for follow-up. We have been using Hydrofera Blue and patient has been using his compression stockings daily. He continues to be evidence of healing. He denies signs of infection. He has no issues or complaints today. Electronic Signature(s) Signed: 06/23/2022 12:31:39 PM By: Geralyn Corwin DO Entered By: Geralyn Corwin on 06/23/2022 11:39:49 -------------------------------------------------------------------------------- Physical Exam Details Patient Name: Date of Service: [name] MES [name]. 06/23/2022 11:15 [name] Medical Record Number: [id number] Patient Account Number: [id number] Date of Birth/Sex: Treating RN: [birth year] (77 y.o. Melonie [place] Primary Care Provider: Jason Coop, [facility] Other Clinician: KEONDRE, [name] ([id number]) [id number] Page 2 of 6 Referring Provider: Treating Provider/Extender: Baruch Gouty NKLIN, EMILY Weeks in Treatment:  10 Constitutional . Cardiovascular . Psychiatric . Notes Right lower extremity: 2+ pitting edema to the knee. Circular open wound to the anterior aspect with granulation tissue. No signs of surrounding infection. Venous stasis dermatitis. Varicose veins around  the ankle. Electronic Signature(s) Signed: 06/23/2022 12:31:39 PM By: Geralyn Corwin DO Entered By: Geralyn Corwin on 06/23/2022 11:42:55 -------------------------------------------------------------------------------- Physician Orders Details Patient Name: Date of Service: [name] MES [name]. 06/23/2022 11:15 [name] Medical Record Number: [id number] Patient Account Number: [id number] Date of Birth/Sex: Treating RN: 1946-01-08 (77 y.o. [name]) [name] Primary Care Provider: Jason Coop, [facility] Other Clinician: Referring Provider: Treating Provider/Extender: Baruch Gouty NKLIN, EMILY Weeks in Treatment: 10 Verbal / Phone Orders: No Diagnosis Coding Follow-up Appointments Return Appointment in 2 weeks. Bathing/ Shower/ Hygiene May shower with wound dressing protected with water repellent cover or cast protector. Anesthetic (Use 'Patient Medications' Section for Anesthetic Order Entry) Lidocaine applied to wound bed Edema Control - Lymphedema / Segmental Compressive Device / Other Patient to wear own compression stockings. Remove compression stockings every night before going to bed and put on every morning when getting up. - bilat Elevate, Exercise Daily and [name] void Standing for Long Periods of Time. Elevate legs to the level of the heart and pump ankles as often as possible Elevate leg(s) parallel to the floor when sitting. Wound Treatment Wound #1 - Lower Leg Wound Laterality: Right, Medial Cleanser: Soap and Water Every Other Day/30 Days Discharge Instructions: Gently cleanse wound with antibacterial soap, rinse and pat dry prior to dressing wounds Cleanser: Wound Cleanser Every Other Day/30 Days Discharge  Instructions: Wash your hands with soap and water. Remove old dressing, discard into plastic bag and place into trash. Cleanse the wound with Wound Cleanser prior to applying [name] clean dressing using gauze sponges, not tissues or cotton balls. Do not scrub or use excessive force. Pat dry using gauze sponges, not tissue or cotton balls. Prim Dressing: Hydrofera Blue Ready Transfer Foam, 2.5x2.5 (in/in) (Dispense As Written) Every Other Day/30 Days ary Discharge Instructions: Apply Hydrofera Blue Ready to wound bed as directed Secondary Dressing: Gauze Every Other Day/30 Days Discharge Instructions: As directed: dry, moistened with saline or moistened with Dakins Solution Secured With: Medipore T - 34M Medipore H Soft Cloth Surgical T ape ape, 2x2 (in/yd) Every Other Day/30 Days Electronic Signature(s) Signed: 06/23/2022 12:31:39 PM By: Geralyn Corwin DO Previous Signature: 06/23/2022 11:33:59 AM Version By: [name] RN Entered By: Geralyn Corwin on 06/23/2022 11:44:17 [name], [name] Jew ([id number]) [id number] Page 3 of 6 -------------------------------------------------------------------------------- Problem List Details Patient Name: Date of Service: [name], [name] MES [name]. 06/23/2022 11:15 [name] Medical Record Number: [id number] Patient Account Number: [id number] Date of Birth/Sex: Treating RN: [birth year] (76 y.o. [name]) [name] Primary Care Provider: Jason Coop, [facility] Other Clinician: Referring Provider: Treating Provider/Extender: Oren Binet, EMILY Weeks in Treatment: 10 Active Problems ICD-10 Encounter Code Description Active Date MDM Diagnosis L97.812 Non-pressure chronic ulcer of other part of right lower leg with fat layer 04/14/2022 No Yes exposed I87.311 Chronic venous hypertension (idiopathic) with ulcer of right lower extremity 04/14/2022 No Yes E11.622 Type 2 diabetes mellitus with other skin ulcer 04/14/2022 No Yes G47.33 Obstructive sleep  apnea (adult) (pediatric) 04/14/2022 No Yes Inactive Problems Resolved Problems Electronic Signature(s) Signed: 06/23/2022 12:31:39 PM By: Geralyn Corwin DO Entered By: Geralyn Corwin on 06/23/2022 11:39:01 -------------------------------------------------------------------------------- Progress Note Details Patient Name: Date of Service: [name] MES [name]. 06/23/2022 11:15 [name] Medical Record Number: [id number] Patient Account Number: [id number] Date of Birth/Sex: Treating RN: 01-22-1946 (76 y.o. [name]) [name] Primary Care Provider: Jason Coop, [facility] Other Clinician: Referring Provider: Treating Provider/Extender: Oren Binet, EMILY Weeks in Treatment: 10 Subjective Chief Complaint Information obtained from Patient  04/14/2022; right lower extremity wound History of Present Illness (HPI) 04/14/2022 [name] is [name] with [name] past medical history of diet-controlled type 2 diabetes, OSA, venous insufficiency and CAD that presents to the clinic for [name] to the right lower extremity. He states the wound started spontaneously and he has been using Neosporin to the area. He wears compression stockings daily. He currently denies signs of infection. He denies pain to the site. The wound has remained stable over the past 6 months. ABIs in office were 1.08 on the right. 2/21; patient presents for follow-up. He did not tolerate the compression wrap well. He states it became too tight at the top and he did cut into it to relieve some of the pressure. He denies signs of infection. 2/28; patient presents for follow-up. He has been using Medihoney and Hydrofera Blue to the wound bed under Tubigrip. He has no issues or complaints today. 3/11; patient presents for follow-up. He has been using Medihoney and Hydrofera Blue to the wound bed under Tubigrip. He has no issues or complaints today. There is been improvement in wound healing. [name], [name] ([id number]) [id number] Page 4 of 6 3/20; patient presents for follow-up. He has been using Medihoney and Hydrofera Blue to the wound bed. Unfortunately he has too much trouble putting on his compression stockings and has not been using them. 3/27; patient presents for follow-up. We have been using Medihoney and Hydrofera Blue to the wound bed. Patient states the Medihoney is causing burning pain. He has been using Tubigrip for compression. 4/10; patient presents for follow-up. He has been using Hydrofera Blue and his own compression stockings daily. There is been improvement in wound healing. 4/24; patient presents for follow-up. We have been using Hydrofera Blue and patient has been using his compression stockings daily. He continues to be evidence of healing. He denies signs of infection. He has no issues or complaints today. Objective Constitutional Vitals Time Taken: 11:18 AM, Height: 67 in, Weight: 224 lbs, BMI: 35.1, Temperature: 98.4 F, Pulse: 77 bpm, Respiratory Rate: 18 breaths/min, Blood Pressure: 130/72 mmHg. General Notes: Right lower extremity: 2+ pitting edema to the knee. Circular open wound to the anterior aspect with granulation tissue. No signs of surrounding infection. Venous stasis dermatitis. Varicose veins around the ankle. Integumentary (Hair, Skin) Wound #1 status is Open. Original cause of wound was Gradually Appeared. The date acquired was: 09/29/2021. The wound has been in treatment 10 weeks. The wound is located on the Right,Medial Lower Leg. The wound measures 0.3cm length x 0.2cm width x 0.1cm depth; 0.047cm^2 area and 0.005cm^3 volume. There is Fat Layer (Subcutaneous Tissue) exposed. There is no tunneling or undermining noted. There is [name] medium amount of serosanguineous drainage noted. There is medium (34-66%) red, pink granulation within the wound bed. There is [name] medium (34-66%) amount of necrotic tissue within the wound  bed. Assessment Active Problems ICD-10 Non-pressure chronic ulcer of other part of right lower leg with fat layer exposed Chronic venous hypertension (idiopathic) with ulcer of right lower extremity Type 2 diabetes mellitus with other skin ulcer Obstructive sleep apnea (adult) (pediatric) Patient's wound has shown improvement in size) since last clinic visit. I recommended continue with Hydrofera Blue and compression stockings daily. Follow- up in 2 weeks. Plan Follow-up Appointments: Return Appointment in 2 weeks. Bathing/ Shower/ Hygiene: May shower with wound dressing protected with water repellent cover or cast protector. Anesthetic (Use 'Patient Medications' Section for Anesthetic  Order Entry): Lidocaine applied to wound bed Edema Control - Lymphedema / Segmental Compressive Device / Other: Patient to wear own compression stockings. Remove compression stockings every night before going to bed and put on every morning when getting up. - bilat Elevate, Exercise Daily and Avoid Standing for Long Periods of Time. Elevate legs to the level of the heart and pump ankles as often as possible Elevate leg(s) parallel to the floor when sitting. WOUND #1: - Lower Leg Wound Laterality: Right, Medial Cleanser: Soap and Water Every Other Day/30 Days Discharge Instructions: Gently cleanse wound with antibacterial soap, rinse and pat dry prior to dressing wounds Cleanser: Wound Cleanser Every Other Day/30 Days Discharge Instructions: Wash your hands with soap and water. Remove old dressing, discard into plastic bag and place into trash. Cleanse the wound with Wound Cleanser prior to applying [name] clean dressing using gauze sponges, not tissues or cotton balls. Do not scrub or use excessive force. Pat dry using gauze sponges, not tissue or cotton balls. Prim Dressing: Hydrofera Blue Ready Transfer Foam, 2.5x2.5 (in/in) (Dispense As Written) Every Other Day/30 Days ary Discharge Instructions: Apply  Hydrofera Blue Ready to wound bed as directed Secondary Dressing: Gauze Every Other Day/30 Days Discharge Instructions: As directed: dry, moistened with saline or moistened with Dakins Solution Secured With: Medipore T - 66M Medipore H Soft Cloth Surgical T ape ape, 2x2 (in/yd) Every Other Day/30 Days 1. [address] [name], [name] ([id number]) [id number] Page 5 of 6 2. Compression stockings daily 3. Follow-up in 2 weeks Electronic Signature(s) Signed: 06/23/2022 12:31:39 PM By: Geralyn Corwin DO Entered By: Geralyn Corwin on 06/23/2022 11:43:50 -------------------------------------------------------------------------------- ROS/PFSH Details Patient Name: Date of Service: [name] MES [name]. 06/23/2022 11:15 [name] Medical Record Number: [id number] Patient Account Number: [id number] Date of Birth/Sex: Treating RN: 29-Jan-1946 (76 y.o. [name]) [name] Primary Care Provider: Jason Coop, [facility] Other Clinician: Referring Provider: Treating Provider/Extender: Oren Binet, EMILY Weeks in Treatment: 10 Information Obtained From Patient Cardiovascular Medical History: Positive for: Coronary Artery Disease; Hypertension; Myocardial Infarction Endocrine Medical History: Positive for: Type II Diabetes Time with diabetes: 5 Immunizations Pneumococcal Vaccine: Received Pneumococcal Vaccination: Yes Received Pneumococcal Vaccination On or After 60th Birthday: Yes Implantable Devices None Family and Social History Former smoker; Marital Status - Married; Alcohol Use: Never; Drug Use: No History; Caffeine Use: Never Electronic Signature(s) Signed: 06/23/2022 12:31:39 PM By: Geralyn Corwin DO Signed: 06/24/2022 3:34:54 PM By: [name] RN Entered By: Geralyn Corwin on 06/23/2022 11:44:27 -------------------------------------------------------------------------------- SuperBill Details Patient Name: Date of Service: [name] MES [name].  06/23/2022 Medical Record Number: [id number] Patient Account Number: [id number] Date of Birth/Sex: Treating RN: [birth year] (76 y.o. [name]) [name] Primary Care Provider: Jason Coop, EMILY Other Clinician: Referring Provider: Treating Provider/Extender: Oren Binet, EMILY Weeks in Treatment: 10 Diagnosis Coding ICD-10 Codes Code Description [id number] Non-pressure chronic ulcer of other part of right lower leg with fat layer exposed I87.311 Chronic venous hypertension (idiopathic) with ulcer of right lower extremity E11.622 Type 2 diabetes mellitus with other skin ulcer G47.33 Obstructive sleep apnea (adult) (pediatric) [name], [name] ([id number]) [id number] Page 6 of 6 Facility Procedures : 7 CPT4 Code: [id number] Description: [phone] - WOUND CARE VISIT-LEV 2 EST PT Modifier: Quantity: 1 Physician Procedures : CPT4 Code Description Modifier [id number] - WC PHYS LEVEL 3 - EST PT ICD-10 Diagnosis Description L97.812 Non-pressure chronic ulcer of other part of right lower leg with fat layer exposed I87.311 Chronic venous hypertension (idiopathic) with ulcer  of  right lower extremity E11.622 Type 2 diabetes mellitus with other skin ulcer G47.33 Obstructive sleep apnea (adult) (pediatric) Quantity: 1 Electronic Signature(s) Signed: 06/23/2022 12:31:39 PM By: Geralyn Corwin DO Previous Signature: 06/23/2022 11:35:16 AM Version By: [name] RN Entered By: Geralyn Corwin on 06/23/2022 11:44:08

## 2022-06-27 ENCOUNTER — Other Ambulatory Visit: Payer: Self-pay | Admitting: Urology

## 2022-07-07 ENCOUNTER — Encounter: Payer: Medicare HMO | Attending: Internal Medicine | Admitting: Internal Medicine

## 2022-07-07 DIAGNOSIS — I87311 Chronic venous hypertension (idiopathic) with ulcer of right lower extremity: Secondary | ICD-10-CM | POA: Insufficient documentation

## 2022-07-07 DIAGNOSIS — I872 Venous insufficiency (chronic) (peripheral): Secondary | ICD-10-CM | POA: Diagnosis not present

## 2022-07-07 DIAGNOSIS — I2510 Atherosclerotic heart disease of native coronary artery without angina pectoris: Secondary | ICD-10-CM | POA: Insufficient documentation

## 2022-07-07 DIAGNOSIS — L97812 Non-pressure chronic ulcer of other part of right lower leg with fat layer exposed: Secondary | ICD-10-CM | POA: Insufficient documentation

## 2022-07-07 DIAGNOSIS — G4733 Obstructive sleep apnea (adult) (pediatric): Secondary | ICD-10-CM | POA: Insufficient documentation

## 2022-07-07 DIAGNOSIS — E11622 Type 2 diabetes mellitus with other skin ulcer: Secondary | ICD-10-CM | POA: Diagnosis present

## 2022-07-09 NOTE — Progress Notes (Signed)
[name], [name] ([id number]) [id number] Page 1 of 5 Visit Report for 07/07/2022 HPI Details Patient Name: Date of Service: [name], [name] MES [name]. 07/07/2022 12:45 PM Medical Record Number: [id number] Patient Account Number: [id number] Date of Birth/Sex: Treating RN: 04-Aug-1945 (76 y.o. [name]) [name] Primary Care Provider: Ardyth Man Other Clinician: Referring Provider: Treating Provider/Extender: RO BSO N, MICHA EL Lenor Coffin Weeks in Treatment: 12 History of Present Illness HPI Description: 04/14/2022 Mr. [name] is [name] with [name] past medical history of diet-controlled type 2 diabetes, OSA, venous insufficiency and CAD that presents to the clinic for [name] to the right lower extremity. He states the wound started spontaneously and he has been using Neosporin to the area. He wears compression stockings daily. He currently denies signs of infection. He denies pain to the site. The wound has remained stable over the past 6 months. ABIs in office were 1.08 on the right. 2/21; patient presents for follow-up. He did not tolerate the compression wrap well. He states it became too tight at the top and he did cut into it to relieve some of the pressure. He denies signs of infection. 2/28; patient presents for follow-up. He has been using Medihoney and Hydrofera Blue to the wound bed under Tubigrip. He has no issues or complaints today. 3/11; patient presents for follow-up. He has been using Medihoney and Hydrofera Blue to the wound bed under Tubigrip. He has no issues or complaints today. There is been improvement in wound healing. 3/20; patient presents for follow-up. He has been using Medihoney and Hydrofera Blue to the wound bed. Unfortunately he has too much trouble putting on his compression stockings and has not been using them. 3/27; patient presents for follow-up. We have been using Medihoney and Hydrofera Blue to  the wound bed. Patient states the Medihoney is causing burning pain. He has been using Tubigrip for compression. 4/10; patient presents for follow-up. He has been using Hydrofera Blue and his own compression stockings daily. There is been improvement in wound healing. 4/24; patient presents for follow-up. We have been using Hydrofera Blue and patient has been using his compression stockings daily. He continues to be evidence of healing. He denies signs of infection. He has no issues or complaints today. 5/8; only [name] small open wound remains we have been using Hydrofera Blue with the patient's on 20/30 mm below-knee stockings. Unfortunately the [facility] has been sticking to the wound Electronic Signature(s) Signed: 07/07/2022 4:41:35 PM By: Baltazar Najjar MD Entered By: Baltazar Najjar on 07/07/2022 13:00:47 -------------------------------------------------------------------------------- Physical Exam Details Patient Name: Date of Service: [name] Deputy MES [name]. 07/07/2022 12:45 PM Medical Record Number: [id number] Patient Account Number: [id number] Date of Birth/Sex: Treating RN: November 13, 1945 (76 y.o. [name] Primary Care Provider: Ardyth Man Other Clinician: Referring Provider: Treating Provider/Extender: RO BSO N, MICHA EL Lenor Coffin Weeks in Treatment: 12 Constitutional Sitting or standing Blood Pressure is within target range for patient.. Pulse regular and within target range for patient.[name] Respirations regular, non-labored and within target range.. Temperature is normal and within the target range for the patient.[name] appears in no distress. Notes Wound exam; right lower extremity medially only [name] small open area remains. Unfortunately the Hydrofera Blue was fairly adherent to the wound surface today and it removed an eschar when the [facility] Blue was removed.No evidence of infection Electronic Signature(s) Signed: 07/07/2022 4:41:35 PM By: Baltazar Najjar MD Entered By:  Baltazar Najjar on 07/07/2022 12:59:51 Physician  Orders Details -------------------------------------------------------------------------------- [name], [name] [name] ([id number]) [id number] Page 2 of 5 Patient Name: Date of Service: [name], [name] MES [name]. 07/07/2022 12:45 PM Medical Record Number: [id number] Patient Account Number: [id number] Date of Birth/Sex: Treating RN: 03-21-1945 (76 y.o. [name]) [name] Primary Care Provider: Ardyth Man Other Clinician: Referring Provider: Treating Provider/Extender: RO BSO N, MICHA EL Lenor Coffin Weeks in Treatment: 12 Verbal / Phone Orders: No Diagnosis Coding Follow-up Appointments Return Appointment in 2 weeks. Bathing/ Shower/ Hygiene May shower with wound dressing protected with water repellent cover or cast protector. Anesthetic (Use 'Patient Medications' Section for Anesthetic Order Entry) Lidocaine applied to wound bed Edema Control - Lymphedema / Segmental Compressive Device / Other Patient to wear own compression stockings. Remove compression stockings every night before going to bed and put on every morning when getting up. - bilat Elevate, Exercise Daily and [name] void Standing for Long Periods of Time. Elevate legs to the level of the heart and pump ankles as often as possible Elevate leg(s) parallel to the floor when sitting. Wound Treatment Wound #1 - Lower Leg Wound Laterality: Right, Medial Cleanser: Soap and Water Every Other Day/30 Days Discharge Instructions: Gently cleanse wound with antibacterial soap, rinse and pat dry prior to dressing wounds Cleanser: Wound Cleanser Every Other Day/30 Days Discharge Instructions: Wash your hands with soap and water. Remove old dressing, discard into plastic bag and place into trash. Cleanse the wound with Wound Cleanser prior to applying [name] clean dressing using gauze sponges, not tissues or cotton balls. Do not scrub or use excessive force. Pat dry using gauze  sponges, not tissue or cotton balls. Prim Dressing: Hydrofera Blue Ready Transfer Foam, 2.5x2.5 (in/in) (Dispense As Written) Every Other Day/30 Days ary Discharge Instructions: use hydrogel to wound base Secondary Dressing: Coverlet Latex-Free Fabric Adhesive Dressings Every Other Day/30 Days Discharge Instructions: 1.5 x 2 Secured With: Medipore T - 68M Medipore H Soft Cloth Surgical T ape ape, 2x2 (in/yd) Every Other Day/30 Days Electronic Signature(s) Signed: 07/07/2022 4:41:35 PM By: Baltazar Najjar MD Signed: 07/09/2022 9:26:52 AM By: [name] RN Entered By: [name] on 07/07/2022 13:05:24 -------------------------------------------------------------------------------- Problem List Details Patient Name: Date of Service: [name] Deputy MES [name]. 07/07/2022 12:45 PM Medical Record Number: [id number] Patient Account Number: [id number] Date of Birth/Sex: Treating RN: 04/02/1945 (76 y.o. [name] Primary Care Provider: Ardyth Man Other Clinician: Referring Provider: Treating Provider/Extender: RO BSO N, MICHA EL Lenor Coffin Weeks in Treatment: 12 Active Problems ICD-10 Encounter Code Description Active Date MDM Diagnosis L97.812 Non-pressure chronic ulcer of other part of right lower leg with fat layer 04/14/2022 No Yes exposed I87.311 Chronic venous hypertension (idiopathic) with ulcer of right lower extremity 04/14/2022 No Yes [name], [name] ([id number]) [id number] Page 3 of 5 E11.622 Type 2 diabetes mellitus with other skin ulcer 04/14/2022 No Yes G47.33 Obstructive sleep apnea (adult) (pediatric) 04/14/2022 No Yes Inactive Problems Resolved Problems Electronic Signature(s) Signed: 07/07/2022 4:41:35 PM By: Baltazar Najjar MD Entered By: Baltazar Najjar on 07/07/2022 12:58:28 -------------------------------------------------------------------------------- Progress Note Details Patient Name: Date of Service: [name] Deputy MES [name]. 07/07/2022 12:45  PM Medical Record Number: [id number] Patient Account Number: [id number] Date of Birth/Sex: Treating RN: January 05, 1946 (76 y.o. [name] Primary Care Provider: Ardyth Man Other Clinician: Referring Provider: Treating Provider/Extender: RO BSO N, MICHA EL Lenor Coffin Weeks in Treatment: 12 Subjective History of Present Illness (HPI) 04/14/2022 Mr. Matson Donini is [name] with [name] past medical history of diet-controlled type 2 diabetes, OSA,  venous insufficiency and CAD that presents to the clinic for [name] to the right lower extremity. He states the wound started spontaneously and he has been using Neosporin to the area. He wears compression stockings daily. He currently denies signs of infection. He denies pain to the site. The wound has remained stable over the past 6 months. ABIs in office were 1.08 on the right. 2/21; patient presents for follow-up. He did not tolerate the compression wrap well. He states it became too tight at the top and he did cut into it to relieve some of the pressure. He denies signs of infection. 2/28; patient presents for follow-up. He has been using Medihoney and Hydrofera Blue to the wound bed under Tubigrip. He has no issues or complaints today. 3/11; patient presents for follow-up. He has been using Medihoney and Hydrofera Blue to the wound bed under Tubigrip. He has no issues or complaints today. There is been improvement in wound healing. 3/20; patient presents for follow-up. He has been using Medihoney and Hydrofera Blue to the wound bed. Unfortunately he has too much trouble putting on his compression stockings and has not been using them. 3/27; patient presents for follow-up. We have been using Medihoney and Hydrofera Blue to the wound bed. Patient states the Medihoney is causing burning pain. He has been using Tubigrip for compression. 4/10; patient presents for follow-up. He has been using Hydrofera  Blue and his own compression stockings daily. There is been improvement in wound healing. 4/24; patient presents for follow-up. We have been using Hydrofera Blue and patient has been using his compression stockings daily. He continues to be evidence of healing. He denies signs of infection. He has no issues or complaints today. 5/8; only [name] small open wound remains we have been using Hydrofera Blue with the patient's on 20/30 mm below-knee stockings. Unfortunately the [facility] has been sticking to the wound Objective Constitutional Sitting or standing Blood Pressure is within target range for patient.. Pulse regular and within target range for patient.[name] Respirations regular, non-labored and within target range.. Temperature is normal and within the target range for the patient.[name] appears in no distress. Vitals Time Taken: 12:48 PM, Height: 67 in, Weight: 224 lbs, BMI: 35.1, Temperature: 98.7 F, Pulse: 67 bpm, Respiratory Rate: 18 breaths/min, Blood Pressure: 111/68 mmHg. General Notes: Wound exam; right lower extremity medially only [name] small open area remains. Unfortunately the Hydrofera Blue was fairly adherent to the wound surface today and it removed an eschar when the [facility] Blue was removed.No evidence of infection [name], [name] ([id number]) [id number] Page 4 of 5 Integumentary (Hair, Skin) Wound #1 status is Open. Original cause of wound was Gradually Appeared. The date acquired was: 09/29/2021. The wound has been in treatment 12 weeks. The wound is located on the Right,Medial Lower Leg. The wound measures 0.3cm length x 0.2cm width x 0.1cm depth; 0.047cm^2 area and 0.005cm^3 volume. There is Fat Layer (Subcutaneous Tissue) exposed. There is [name] medium amount of serosanguineous drainage noted. There is medium (34-66%) red, pink granulation within the wound bed. There is [name] medium (34-66%) amount of necrotic tissue within the wound bed. Assessment Active  Problems ICD-10 Non-pressure chronic ulcer of other part of right lower leg with fat layer exposed Chronic venous hypertension (idiopathic) with ulcer of right lower extremity Type 2 diabetes mellitus with other skin ulcer Obstructive sleep apnea (adult) (pediatric) Plan Follow-up Appointments: Return Appointment in 2 weeks. Bathing/ Shower/ Hygiene: May shower with wound  dressing protected with water repellent cover or cast protector. Anesthetic (Use 'Patient Medications' Section for Anesthetic Order Entry): Lidocaine applied to wound bed Edema Control - Lymphedema / Segmental Compressive Device / Other: Patient to wear own compression stockings. Remove compression stockings every night before going to bed and put on every morning when getting up. - bilat Elevate, Exercise Daily and Avoid Standing for Long Periods of Time. Elevate legs to the level of the heart and pump ankles as often as possible Elevate leg(s) parallel to the floor when sitting. WOUND #1: - Lower Leg Wound Laterality: Right, Medial Cleanser: Soap and Water Every Other Day/30 Days Discharge Instructions: Gently cleanse wound with antibacterial soap, rinse and pat dry prior to dressing wounds Cleanser: Wound Cleanser Every Other Day/30 Days Discharge Instructions: Wash your hands with soap and water. Remove old dressing, discard into plastic bag and place into trash. Cleanse the wound with Wound Cleanser prior to applying [name] clean dressing using gauze sponges, not tissues or cotton balls. Do not scrub or use excessive force. Pat dry using gauze sponges, not tissue or cotton balls. Prim Dressing: Hydrofera Blue Ready Transfer Foam, 2.5x2.5 (in/in) (Dispense As Written) Every Other Day/30 Days ary Discharge Instructions: use hydrogel to wound base Secondary Dressing: Gauze Every Other Day/30 Days Discharge Instructions: As directed: dry, moistened with saline or moistened with Dakins Solution Secured With: Medipore T - 16M  Medipore H Soft Cloth Surgical T ape ape, 2x2 (in/yd) Every Other Day/30 Days 1. We put [name] small amount of hydrogel under the [facility] which the patient will carry on at home when the dressings are changed 2. The wound looks clean under illumination. No need for mechanical debridement 3. His edema control was adequate with his compression stockings Electronic Signature(s) Signed: 07/07/2022 4:41:35 PM By: Baltazar Najjar MD Entered By: Baltazar Najjar on 07/07/2022 13:01:30 -------------------------------------------------------------------------------- SuperBill Details Patient Name: Date of Service: [name] Deputy MES [name]. 07/07/2022 Medical Record Number: [id number] Patient Account Number: [id number] Date of Birth/Sex: Treating RN: [birth year] (76 y.o. [name] Primary Care Provider: Ardyth Man Other Clinician: Referring Provider: Treating Provider/Extender: Chauncey Mann, MICHA EL Lenor Coffin Weeks in Treatment: 12 Diagnosis Coding ICD-10 Codes Code Description [phone] Non-pressure chronic ulcer of other part of right lower leg with fat layer exposed I87.311 Chronic venous hypertension (idiopathic) with ulcer of right lower extremity [name], [name] ([id number]) [id number] Page 5 of 5 E11.622 Type 2 diabetes mellitus with other skin ulcer G47.33 Obstructive sleep apnea (adult) (pediatric) Physician Procedures : CPT4 Code Description Modifier [phone] [id number] - WC PHYS LEVEL 3 - EST PT ICD-10 Diagnosis Description L97.812 Non-pressure chronic ulcer of other part of right lower leg with fat layer exposed I87.311 Chronic venous hypertension (idiopathic) with ulcer  of right lower extremity Quantity: 1 Electronic Signature(s) Signed: 07/07/2022 4:41:35 PM By: Baltazar Najjar MD Signed: 07/09/2022 9:26:52 AM By: [name] RN Entered By: [name] on 07/07/2022 13:04:18

## 2022-07-09 NOTE — Progress Notes (Signed)
[name] ([id number]) [id number] Page 1 of 7 Visit Report for 07/07/2022 Arrival Information Details Patient Name: Date of Service: [name], [name] MES [name]. 07/07/2022 12:45 PM Medical Record Number: [id number] Patient Account Number: [id number] Date of Birth/Sex: Treating RN: 07-22-1945 (76 y.o. [name]) [name] Primary Care [name]: [name] Other Clinician: Referring [name]: Treating [name]/Extender: [name] [name], [name] Weeks in Treatment: 12 Visit Information History Since Last Visit Added or deleted any medications: No Patient Arrived: Ambulatory Any new allergies or adverse reactions: No Arrival Time: 12:44 Had [name] fall or experienced change in No Accompanied By: wife activities of daily living that may affect Transfer Assistance: None risk of falls: Patient Identification Verified: Yes Signs or symptoms of abuse/neglect since last visito No Secondary Verification Process Completed: Yes Hospitalized since last visit: No Patient Requires Transmission-Based Precautions: No Implantable device outside of the clinic excluding No Patient Has Alerts: Yes cellular tissue based products placed in the center Patient Alerts: Patient on Blood Thinner since last visit: Baby ASA 81mg  Has Dressing in Place as Prescribed: Yes Has Compression in Place as Prescribed: Yes Pain Present Now: No Electronic Signature(s) Signed: 07/09/2022 9:26:52 AM By: [name] Pax RN Entered By: [name] on 07/07/2022 12:48:22 -------------------------------------------------------------------------------- Clinic Level of Care Assessment Details Patient Name: Date of Service: [name], [name] MES [name]. 07/07/2022 12:45 PM Medical Record Number: [id number] Patient Account Number: [id number] Date of Birth/Sex: Treating RN: 12-23-1945 (76 y.o. [name]) [name] Primary Care Jerline Linzy: [name] Other Clinician: Referring [name]: Treating [name]/Extender: [name] N,  [name] Weeks in Treatment: 12 Clinic Level of Care Assessment Items TOOL 4 Quantity Score X- 1 0 Use when only an EandM is performed on FOLLOW-UP visit ASSESSMENTS - Nursing Assessment / Reassessment X- 1 10 Reassessment of Co-morbidities (includes updates in patient status) X- 1 5 Reassessment of Adherence to Treatment Plan ASSESSMENTS - Wound and Skin [name] ssessment / Reassessment X - Simple Wound Assessment / Reassessment - one wound 1 5 []  - 0 Complex Wound Assessment / Reassessment - multiple wounds []  - 0 Dermatologic / Skin Assessment (not related to wound area) ASSESSMENTS - Focused Assessment []  - 0 Circumferential Edema Measurements - multi extremities []  - 0 Nutritional Assessment / Counseling / Intervention []  - 0 Lower Extremity Assessment (monofilament, tuning fork, pulses) []  - 0 Peripheral Arterial Disease Assessment (using hand held doppler) ASSESSMENTS - Ostomy and/or Continence Assessment and Care []  - 0 Incontinence Assessment and Management []  - 0 Ostomy Care Assessment and Management (repouching, etc.) PROCESS - Coordination of Care [name] ([id number]) [id number] Page 2 of 7 X- 1 15 Simple Patient / Family Education for ongoing care []  - 0 Complex (extensive) Patient / Family Education for ongoing care []  - 0 Staff obtains Chiropractor, Records, T Results / Process Orders est []  - 0 Staff telephones HHA, Nursing Homes / Clarify orders / etc []  - 0 Routine Transfer to another Facility (non-emergent condition) []  - 0 Routine Hospital Admission (non-emergent condition) []  - 0 New Admissions / Manufacturing engineer / Ordering NPWT Apligraf, etc. , []  - 0 Emergency Hospital Admission (emergent condition) X- 1 10 Simple Discharge Coordination []  - 0 Complex (extensive) Discharge Coordination PROCESS - Special Needs []  - 0 Pediatric / Minor Patient Management []  - 0 Isolation Patient  Management []  - 0 Hearing / Language / Visual special needs []  - 0 Assessment of Community assistance (transportation, D/C planning, etc.) []  - 0 Additional assistance / Altered  mentation []  - 0 Support Surface(s) Assessment (bed, cushion, seat, etc.) INTERVENTIONS - Wound Cleansing / Measurement X - Simple Wound Cleansing - one wound 1 5 []  - 0 Complex Wound Cleansing - multiple wounds X- 1 5 Wound Imaging (photographs - any number of wounds) []  - 0 Wound Tracing (instead of photographs) X- 1 5 Simple Wound Measurement - one wound []  - 0 Complex Wound Measurement - multiple wounds INTERVENTIONS - Wound Dressings X - Small Wound Dressing one or multiple wounds 1 10 []  - 0 Medium Wound Dressing one or multiple wounds []  - 0 Large Wound Dressing one or multiple wounds []  - 0 Application of Medications - topical []  - 0 Application of Medications - injection INTERVENTIONS - Miscellaneous []  - 0 External ear exam []  - 0 Specimen Collection (cultures, biopsies, blood, body fluids, etc.) []  - 0 Specimen(s) / Culture(s) sent or taken to Lab for analysis []  - 0 Patient Transfer (multiple staff / Nurse, adult / Similar devices) []  - 0 Simple Staple / Suture removal (25 or less) []  - 0 Complex Staple / Suture removal (26 or more) []  - 0 Hypo / Hyperglycemic Management (close monitor of Blood Glucose) []  - 0 Ankle / Brachial Index (ABI) - do not check if billed separately X- 1 5 Vital Signs Has the patient been seen at the hospital within the last three years: Yes Total Score: 75 Level Of Care: New/Established - Level 2 Electronic Signature(s) Signed: 07/09/2022 9:26:52 AM By: [name] Pax RN [name] ([id number]) [id number] Page 3 of 7 Entered By: [name] on 07/07/2022 13:04:13 -------------------------------------------------------------------------------- Encounter Discharge Information Details Patient Name: Date of Service: [name], [name] MES [name]. 07/07/2022 12:45 PM Medical Record Number: [id number] Patient Account Number: [id number] Date of Birth/Sex: Treating RN: 1946/02/23 (76 y.o. [name]) [name] Primary Care Woodley Petzold: [name] Other Clinician: Referring Cade Dashner: Treating Tadeo Besecker/Extender: [name] [name], [name] Weeks in Treatment: 12 Encounter Discharge Information Items Discharge Condition: Stable Ambulatory Status: Walker Discharge Destination: Home Transportation: Private Auto Accompanied By: wife Schedule Follow-up Appointment: Yes Clinical Summary of Care: Electronic Signature(s) Signed: 07/09/2022 9:26:52 AM By: [name] Pax RN Entered By: [name] on 07/07/2022 13:06:26 -------------------------------------------------------------------------------- Lower Extremity Assessment Details Patient Name: Date of Service: [name], [name] MES [name]. 07/07/2022 12:45 PM Medical Record Number: [id number] Patient Account Number: [id number] Date of Birth/Sex: Treating RN: Mar 31, 1945 (76 y.o. Melonie [place] Primary Care Kane Kusek: [name] Other Clinician: Referring Tywaun Hiltner: Treating Davionna Blacksher/Extender: [name] N, [name] Weeks in Treatment: 12 Edema Assessment Assessed: [Left: No] [Right: No] Edema: [Left: N] [Right: o] Calf Left: Right: Point of Measurement: 32 cm From Medial Instep 38 cm Ankle Left: Right: Point of Measurement: 10 cm From Medial Instep 24 cm Vascular Assessment Pulses: Dorsalis Pedis Palpable: [Right:Yes] Electronic Signature(s) Signed: 07/09/2022 9:26:52 AM By: [name] Pax RN Entered By: [name] on 07/07/2022 12:52:39 -------------------------------------------------------------------------------- Multi Wound Chart Details Patient Name: Date of Service: [name] [name] MES [name]. 07/07/2022 12:45 PM Medical Record Number: [id number] Patient Account Number: [id number] Date of Birth/Sex: Treating RN: [birth year] (76 y.o. Melonie [place] Primary Care  Anuar Walgren: [name] Other Clinician: Referring Kyri Shader: Treating Lashina Milles/Extender: Chauncey Mann, [name] Hoskins, Desha [name] ([id number]) [phone].pdf Page 4 of 7 Weeks in Treatment: 12 Vital Signs Height(in): 67 Pulse(bpm): 67 Weight(lbs): 224 Blood Pressure(mmHg): 111/68 Body Mass Index(BMI): 35.1 Temperature(F): 98.7 Respiratory Rate(breaths/min): 18 [1:Photos:] [N/[name]:N/[name]] Right, Medial Lower Leg N/[name] N/[name] Wound Location: Gradually Appeared  N/[name] N/[name] Wounding Event: Diabetic Wound/Ulcer of the Lower N/[name] N/[name] Primary Etiology: Extremity Coronary Artery Disease, N/[name] N/[name] Comorbid History: Hypertension, Myocardial Infarction, Type II Diabetes 09/29/2021 N/[name] N/[name] Date Acquired: 12 N/[name] N/[name] Weeks of Treatment: Open N/[name] N/[name] Wound Status: No N/[name] N/[name] Wound Recurrence: 0.3x0.2x0.1 N/[name] N/[name] Measurements L x W x D (cm) 0.047 N/[name] N/[name] [name] (cm) : rea 0.005 N/[name] N/[name] Volume (cm) : 94.00% N/[name] N/[name] % Reduction in [name] rea: 93.70% N/[name] N/[name] % Reduction in Volume: Grade 1 N/[name] N/[name] Classification: Medium N/[name] N/[name] Exudate [name] mount: Serosanguineous N/[name] N/[name] Exudate Type: red, brown N/[name] N/[name] Exudate Color: Medium (34-66%) N/[name] N/[name] Granulation [name] mount: Red, Pink N/[name] N/[name] Granulation Quality: Medium (34-66%) N/[name] N/[name] Necrotic [name] mount: Fat Layer (Subcutaneous Tissue): Yes N/[name] N/[name] Exposed Structures: Fascia: No Tendon: No Muscle: No Joint: No Bone: No None N/[name] N/[name] Epithelialization: Treatment Notes Electronic Signature(s) Signed: 07/09/2022 9:26:52 AM By: [name] Pax RN Entered By: [name] on 07/07/2022 12:52:45 -------------------------------------------------------------------------------- Multi-Disciplinary Care Plan Details Patient Name: Date of Service: [name] [name] MES [name]. 07/07/2022 12:45 PM Medical Record Number: [id number] Patient Account Number: [id number] Date of Birth/Sex: Treating RN: 07-Dec-1945 (76 y.o. Melonie [place] Primary Care  Carlisia Geno: [name] Other Clinician: Referring Falyn Rubel: Treating Adamae Ricklefs/Extender: [name] [name], [name] Weeks in Treatment: 12 Active Inactive Wound/Skin Impairment Nursing Diagnoses: TROOPER, LANGREHR ([id number]) [id number] Page 5 of 7 Knowledge deficit related to ulceration/compromised skin integrity Goals: Patient/caregiver will verbalize understanding of skin care regimen Date Initiated: 04/14/2022 Target Resolution Date: 07/13/2022 Goal Status: Active Ulcer/skin breakdown will have [name] volume reduction of 30% by week 4 Date Initiated: 04/14/2022 Date Inactivated: 06/09/2022 Target Resolution Date: 05/13/2022 Goal Status: Unmet Unmet Reason: comorbidities Ulcer/skin breakdown will have [name] volume reduction of 50% by week 8 Date Initiated: 04/14/2022 Date Inactivated: 06/23/2022 Target Resolution Date: 06/13/2022 Goal Status: Met Ulcer/skin breakdown will have [name] volume reduction of 80% by week 12 Date Initiated: 04/14/2022 Target Resolution Date: 07/13/2022 Goal Status: Active Ulcer/skin breakdown will heal within 14 weeks Date Initiated: 04/14/2022 Target Resolution Date: 08/13/2022 Goal Status: Active Interventions: Assess patient/caregiver ability to obtain necessary supplies Assess patient/caregiver ability to perform ulcer/skin care regimen upon admission and as needed Assess ulceration(s) every visit Notes: Electronic Signature(s) Signed: 07/09/2022 9:26:52 AM By: [name] Pax RN Entered By: [name] on 07/07/2022 12:53:01 -------------------------------------------------------------------------------- Pain Assessment Details Patient Name: Date of Service: [name], [name] MES [name]. 07/07/2022 12:45 PM Medical Record Number: [id number] Patient Account Number: [id number] Date of Birth/Sex: Treating RN: [birth year] (76 y.o. Melonie [place] Primary Care Mozelle Remlinger: [name] Other Clinician: Referring Qiara Minetti: Treating  Loy Little/Extender: [name] N, [name] Weeks in Treatment: 12 Active Problems Location of Pain Severity and Description of Pain Patient Has Paino No Site Locations Pain Management and Medication Current Pain Management: Electronic Signature(s) Signed: 07/09/2022 9:26:52 AM By: [name] Pax RN Entered By: [name] on 07/07/2022 12:49:18 Lynk, Rudy [name] ([id number]) [id number] Page 6 of 7 -------------------------------------------------------------------------------- Patient/Caregiver Education Details Patient Name: Date of Service: [name], [name] MES [name]. 5/8/2024andnbsp12:45 PM Medical Record Number: [id number] Patient Account Number: [id number] Date of Birth/Gender: Treating RN: [birth year] (76 y.o. [name]) [name] Primary Care Physician: [name] Other Clinician: Referring Physician: Treating Physician/Extender: [name] [name], [name] EL Elie Confer in Treatment: 12 Education Assessment Education Provided To: Patient Education Topics Provided Wound/Skin Impairment: Handouts: Caring for Your Ulcer Methods: Explain/Verbal Responses: State content correctly Electronic Signature(s) Signed: 07/09/2022 9:26:52 AM By:  [name] Pax RN Entered By: [name] on 07/07/2022 12:53:16 -------------------------------------------------------------------------------- Wound Assessment Details Patient Name: Date of Service: [name], [name] MES [name]. 07/07/2022 12:45 PM Medical Record Number: [id number] Patient Account Number: [id number] Date of Birth/Sex: Treating RN: [birth year] (76 y.o. [name]) [name] Primary Care Doloros Kwolek: [name] Other Clinician: Referring Sonny Anthes: Treating Zachariah Pavek/Extender: [name] [name], [name] Weeks in Treatment: 12 Wound Status Wound Number: 1 Primary Diabetic Wound/Ulcer of the Lower Extremity Etiology: Wound Location: Right, Medial Lower Leg Wound Open Wounding Event: Gradually  Appeared Status: Date Acquired: 09/29/2021 Comorbid Coronary Artery Disease, Hypertension, Myocardial Infarction, Weeks Of Treatment: 12 History: Type II Diabetes Clustered Wound: No Photos Wound Measurements Length: (cm) 0.3 Width: (cm) 0.2 Depth: (cm) 0.1 Area: (cm) 0.047 Volume: (cm) 0.005 % Reduction in Area: 94% % Reduction in Volume: 93.7% Epithelialization: None Wound Description Classification: Grade 1 Exudate Amount: Medium Ferrucci, Noriel [name] ([id number]) Exudate Type: Serosanguineous Exudate Color: red, brown Foul Odor After Cleansing: No Slough/Fibrino Yes [id number] Page 7 of 7 Wound Bed Granulation Amount: Medium (34-66%) Exposed Structure Granulation Quality: Red, Pink Fascia Exposed: No Necrotic Amount: Medium (34-66%) Fat Layer (Subcutaneous Tissue) Exposed: Yes Tendon Exposed: No Muscle Exposed: No Joint Exposed: No Bone Exposed: No Treatment Notes Wound #1 (Lower Leg) Wound Laterality: Right, Medial Cleanser Soap and Water Discharge Instruction: Gently cleanse wound with antibacterial soap, rinse and pat dry prior to dressing wounds Wound Cleanser Discharge Instruction: Wash your hands with soap and water. Remove old dressing, discard into plastic bag and place into trash. Cleanse the wound with Wound Cleanser prior to applying [name] clean dressing using gauze sponges, not tissues or cotton balls. Do not scrub or use excessive force. Pat dry using gauze sponges, not tissue or cotton balls. Peri-Wound Care Topical Primary Dressing Hydrofera Blue Ready Transfer Foam, 2.5x2.5 (in/in) Discharge Instruction: use hydrogel to wound base Secondary Dressing Coverlet Latex-Free Fabric Adhesive Dressings Discharge Instruction: 1.5 x 2 Secured With Medipore T - 43M Medipore H Soft Cloth Surgical T ape ape, 2x2 (in/yd) Compression Wrap Compression Stockings Add-Ons Electronic Signature(s) Signed: 07/09/2022 9:26:52 AM By: [name] Pax  RN Entered By: [name] on 07/07/2022 12:52:16 -------------------------------------------------------------------------------- Vitals Details Patient Name: Date of Service: [name] [name] MES [name]. 07/07/2022 12:45 PM Medical Record Number: [id number] Patient Account Number: [id number] Date of Birth/Sex: Treating RN: 12-13-1945 (76 y.o. Melonie [place] Primary Care Briarrose Shor: [name] Other Clinician: Referring Davarius Ridener: Treating Eivan Gallina/Extender: [name] N, [name] Weeks in Treatment: 12 Vital Signs Time Taken: 12:48 Temperature (F): 98.7 Height (in): 67 Pulse (bpm): 67 Weight (lbs): 224 Respiratory Rate (breaths/min): 18 Body Mass Index (BMI): 35.1 Blood Pressure (mmHg): 111/68 Reference Range: 80 - 120 mg / dl Electronic Signature(s) Signed: 07/09/2022 9:26:52 AM By: [name] Pax RN Entered By: [name] on 07/07/2022 12:49:09

## 2022-07-14 ENCOUNTER — Encounter (HOSPITAL_BASED_OUTPATIENT_CLINIC_OR_DEPARTMENT_OTHER): Payer: Medicare HMO | Admitting: Internal Medicine

## 2022-07-14 DIAGNOSIS — L97812 Non-pressure chronic ulcer of other part of right lower leg with fat layer exposed: Secondary | ICD-10-CM

## 2022-07-14 DIAGNOSIS — I87311 Chronic venous hypertension (idiopathic) with ulcer of right lower extremity: Secondary | ICD-10-CM

## 2022-07-14 DIAGNOSIS — E11622 Type 2 diabetes mellitus with other skin ulcer: Secondary | ICD-10-CM

## 2022-07-14 DIAGNOSIS — G4733 Obstructive sleep apnea (adult) (pediatric): Secondary | ICD-10-CM | POA: Diagnosis not present

## 2022-07-28 ENCOUNTER — Encounter (HOSPITAL_BASED_OUTPATIENT_CLINIC_OR_DEPARTMENT_OTHER): Payer: Medicare HMO | Admitting: Internal Medicine

## 2022-07-28 DIAGNOSIS — G4733 Obstructive sleep apnea (adult) (pediatric): Secondary | ICD-10-CM | POA: Diagnosis not present

## 2022-07-28 DIAGNOSIS — L97812 Non-pressure chronic ulcer of other part of right lower leg with fat layer exposed: Secondary | ICD-10-CM | POA: Diagnosis not present

## 2022-07-28 DIAGNOSIS — I87311 Chronic venous hypertension (idiopathic) with ulcer of right lower extremity: Secondary | ICD-10-CM | POA: Diagnosis not present

## 2022-07-28 DIAGNOSIS — E11622 Type 2 diabetes mellitus with other skin ulcer: Secondary | ICD-10-CM | POA: Diagnosis not present

## 2022-08-05 ENCOUNTER — Ambulatory Visit: Payer: Medicare HMO | Admitting: Dermatology

## 2022-08-05 VITALS — BP 92/58 | HR 72

## 2022-08-05 DIAGNOSIS — L219 Seborrheic dermatitis, unspecified: Secondary | ICD-10-CM

## 2022-08-05 MED ORDER — KETOCONAZOLE 2 % EX CREA: 1.0000 | TOPICAL_CREAM | Freq: Two times a day (BID) | CUTANEOUS | 3 refills | Status: DC

## 2022-08-05 MED ORDER — KETOCONAZOLE 2 % EX SHAM: MEDICATED_SHAMPOO | CUTANEOUS | 3 refills | Status: DC

## 2022-08-05 MED ORDER — HYDROCORTISONE 2.5 % EX CREA
TOPICAL_CREAM | Freq: Two times a day (BID) | CUTANEOUS | 3 refills | Status: DC | PRN
Start: 2022-08-05 — End: 2022-11-15

## 2022-08-05 NOTE — Progress Notes (Signed)
   New Patient Visit   Subjective  CORINNE BAYLESS is a 77 y.o. male who presents for the following: patient is a new patient  here with wife, concerning scaly rash at scalp and face. Reports he is using some tea tree oil on areas but doesn't help   The following portions of the chart were reviewed this encounter and updated as appropriate: medications, allergies, medical history  Review of Systems:  No other skin or systemic complaints except as noted in HPI or Assessment and Plan.  Objective  Well appearing patient in no apparent distress; mood and affect are within normal limits.   A focused examination was performed of the following areas: Scalp, face  Relevant exam findings are noted in the Assessment and Plan.    Assessment & Plan   SEBORRHEIC DERMATITIS Exam: Pink patches with greasy scale at at ears , scalp and face   Chronic and persistent condition with duration or expected duration over one year. Condition is bothersome/symptomatic for patient. Currently flared.  Hx of Parkinson's Disease.  Seborrheic Dermatitis is a chronic persistent rash characterized by pinkness and scaling most commonly of the mid face but also can occur on the scalp (dandruff), ears; mid chest, mid back and groin.  It tends to be exacerbated by stress and cooler weather.  People who have neurologic disease may experience new onset or exacerbation of existing seborrheic dermatitis.  The condition is not curable but treatable and can be controlled.  Treatment Plan: Mix equal amounts of hydrocortisone 2.5% cream with ketaconazole 2% cream and apply to affected areas twice a day.  If improved, decrease to hydrocortisone and ketaconazole mixed once a day. If still clear, decrease to ketaconazole cream only, once daily to help prevent flares.  Do not use the hydrocortisone cream for maintenance, since long term use of a topical steroid can thin the skin.  May repeat regimen as needed for flares.   Start  Ketoconazole 2% shampoo - apply three times per week, massage into scalp and leave in for 10 minutes before rinsing out   Seborrheic dermatitis  Related Medications ketoconazole (NIZORAL) 2 % cream Apply 1 Application topically 2 (two) times daily. Use as directed in print out  hydrocortisone 2.5 % cream Apply topically 2 (two) times daily as needed (Rash). Use as directed in printout    Return for 2 month seb derm follow up.  I, Asher Muir, CMA, am acting as scribe for Willeen Niece, MD.   Documentation: I have reviewed the above documentation for accuracy and completeness, and I agree with the above.  Willeen Niece, MD

## 2022-08-05 NOTE — Patient Instructions (Addendum)
When flared at face/ears/scalp   Seborrheic Dermatitis  Mix hydrocortisone with ketaconazole 2% twice a day. If improved, decrease to hydrocortisone and ketaconazole mixed once a day. If still clear, decrease to ketaconazole only.  When clear or calm at areas.  Use ketoconazole cream apply topically once daily to affected areas.    For scalp and face   Start Ketoconazole shampoo - can use apply three times per week, massage into scalp and leave in for 10 minutes before rinsing out. Can also use as a body wash to affected areas of face (avoid eye area) massage onto skin, let sit a few minutes before rinsing off.       Due to recent changes in healthcare laws, you may see results of your pathology and/or laboratory studies on MyChart before the doctors have had a chance to review them. We understand that in some cases there may be results that are confusing or concerning to you. Please understand that not all results are received at the same time and often the doctors may need to interpret multiple results in order to provide you with the best plan of care or course of treatment. Therefore, we ask that you please give [place] 2 business days to thoroughly review all your results before contacting the office for clarification. Should we see a critical lab result, you will be contacted sooner.   If You Need Anything After Your Visit  If you have any questions or concerns for your doctor, please call our main line at [phone] and press option 4 to reach your doctor's medical assistant. If no one answers, please leave a voicemail as directed and we will return your call as soon as possible. Messages left after 4 pm will be answered the following business day.   You may also send [place] a message via MyChart. We typically respond to MyChart messages within 1-2 business days.  For prescription refills, please ask your pharmacy to contact our office. Our fax number is [phone].  If you have an  urgent issue when the clinic is closed that cannot wait until the next business day, you can page your doctor at the number below.    Please note that while we do our best to be available for urgent issues outside of office hours, we are not available 24/7.   If you have an urgent issue and are unable to reach [place], you may choose to seek medical care at your doctor's office, retail clinic, urgent care center, or emergency room.  If you have a medical emergency, please immediately call 911 or go to the emergency department.  Pager Numbers  - Dr. Gwen Pounds: [phone]  - Dr. Neale Burly: [phone]  - Dr. Roseanne Reno: [phone]  In the event of inclement weather, please call our main line at [phone] for an update on the status of any delays or closures.  Dermatology Medication Tips: Please keep the boxes that topical medications come in in order to help keep track of the instructions about where and how to use these. Pharmacies typically print the medication instructions only on the boxes and not directly on the medication tubes.   If your medication is too expensive, please contact our office at [phone] option 4 or send [place] a message through MyChart.   We are unable to tell what your co-pay for medications will be in advance as this is different depending on your insurance coverage. However, we may be able to find a substitute medication at lower cost or  fill out paperwork to get insurance to cover a needed medication.   If a prior authorization is required to get your medication covered by your insurance company, please allow [place] 1-2 business days to complete this process.  Drug prices often vary depending on where the prescription is filled and some pharmacies may offer cheaper prices.  The website www.goodrx.com contains coupons for medications through different pharmacies. The prices here do not account for what the cost may be with help from insurance (it may be cheaper with your  insurance), but the website can give you the price if you did not use any insurance.  - You can print the associated coupon and take it with your prescription to the pharmacy.  - You may also stop by our office during regular business hours and pick up a GoodRx coupon card.  - If you need your prescription sent electronically to a different pharmacy, notify our office through [facility] or by phone at [phone] option 4.     Si Usted Necesita Algo Despus de Su Visita  Tambin puede enviarnos un mensaje a travs de Clinical cytogeneticist. Por lo general respondemos a los mensajes de MyChart en el transcurso de 1 a 2 das hbiles.  Para renovar recetas, por favor pida a su farmacia que se ponga en contacto con nuestra oficina. Annie Sable de fax es [place] [phone].  Si tiene un asunto urgente cuando la clnica est cerrada y que no puede esperar hasta el siguiente da hbil, puede llamar/localizar a su doctor(a) al nmero que aparece a continuacin.   Por favor, tenga en cuenta que aunque hacemos todo lo posible para estar disponibles para asuntos urgentes fuera del horario de [place], no estamos disponibles las 24 horas del da, los 7 [address]  [address] de la [place].   Si tiene un problema urgente y no puede comunicarse con nosotros, puede optar por buscar atencin mdica  en el consultorio de su doctor(a), en una clnica privada, en un centro de atencin urgente o en una sala de emergencias.  Si tiene Engineer, drilling, por favor llame inmediatamente al 911 o vaya a la sala de emergencias.  Nmeros de bper  - Dr. Gwen Pounds: [phone]  - Dra. Moye: [phone]  - Dra. Roseanne Reno: [phone]  En caso de inclemencias del [place], por favor llame a Lacy Duverney principal al [phone] para una actualizacin sobre el [place] de cualquier retraso o cierre.  Consejos para la medicacin en dermatologa: Por favor, guarde las cajas en las que vienen los medicamentos de uso tpico para ayudarle a  seguir las instrucciones sobre dnde y cmo usarlos. Las farmacias generalmente imprimen las instrucciones del medicamento slo en las cajas y no directamente en los tubos del [place].   Si su medicamento es muy caro, por favor, pngase en contacto con Rolm Gala llamando al [phone] y presione la opcin 4 o envenos un mensaje a travs de Clinical cytogeneticist.   No podemos decirle cul ser su copago por los medicamentos por adelantado ya que esto es diferente dependiendo de la cobertura de su seguro. Sin embargo, es posible que podamos encontrar un medicamento sustituto a Audiological scientist un formulario para que el seguro cubra el medicamento que se considera necesario.   Si se requiere una autorizacin previa para que su compaa de seguros [place] su medicamento, por favor permtanos de 1 a 2 das hbiles para completar [address]  Los precios de los medicamentos varan con frecuencia dependiendo del Environmental consultant de dnde se surte la receta  y [place] farmacias pueden ofrecer precios ms baratos.  El sitio web www.goodrx.com tiene cupones para medicamentos de Health and safety inspector. Los precios aqu no tienen en cuenta lo que podra costar con la ayuda del seguro (puede ser ms barato con su seguro), pero el sitio web puede darle el precio si no utiliz Tourist information centre manager.  - Puede imprimir el cupn correspondiente y llevarlo con su receta a la farmacia.  - Tambin puede pasar por nuestra oficina durante el horario de atencin regular y Education officer, museum una tarjeta de cupones de GoodRx.  - Si necesita que su receta se enve electrnicamente a una farmacia diferente, informe a nuestra oficina a travs de MyChart de [place] o por telfono llamando al [phone] y presione la opcin 4.

## 2022-08-09 ENCOUNTER — Ambulatory Visit: Payer: Medicare HMO | Attending: Neurology

## 2022-08-09 DIAGNOSIS — M545 Low back pain, unspecified: Secondary | ICD-10-CM | POA: Insufficient documentation

## 2022-08-09 DIAGNOSIS — R269 Unspecified abnormalities of gait and mobility: Secondary | ICD-10-CM | POA: Insufficient documentation

## 2022-08-09 DIAGNOSIS — R2681 Unsteadiness on feet: Secondary | ICD-10-CM | POA: Diagnosis present

## 2022-08-09 DIAGNOSIS — R262 Difficulty in walking, not elsewhere classified: Secondary | ICD-10-CM | POA: Diagnosis present

## 2022-08-09 DIAGNOSIS — R278 Other lack of coordination: Secondary | ICD-10-CM | POA: Insufficient documentation

## 2022-08-09 DIAGNOSIS — M6281 Muscle weakness (generalized): Secondary | ICD-10-CM | POA: Insufficient documentation

## 2022-08-09 DIAGNOSIS — G8929 Other chronic pain: Secondary | ICD-10-CM | POA: Insufficient documentation

## 2022-08-09 DIAGNOSIS — R2689 Other abnormalities of gait and mobility: Secondary | ICD-10-CM | POA: Diagnosis present

## 2022-08-09 NOTE — Therapy (Signed)
OUTPATIENT PHYSICAL THERAPY NEURO EVALUATION   Patient Name: [name] MRN: [id number] DOB:03-Jun-1945, 77 y.o., male Today's Date: 08/10/2022   PCP: Ardyth Man, PA- C REFERRING PROVIDER: Morene Crocker, MD  END OF SESSION:  PT End of Session - 08/09/22 1108     Visit Number 1    Number of Visits 24    Date for PT Re-Evaluation 11/01/22    Progress Note Due on Visit 10    PT Start Time 1100    PT Stop Time 1144    PT Time Calculation (min) 44 min    Equipment Utilized During Treatment Gait belt    Activity Tolerance Patient tolerated treatment well    Behavior During Therapy WFL for tasks assessed/performed             Past Medical History:  Diagnosis Date   Anxiety    Arteriosclerosis of coronary artery 11/16/2011   Overview:  Stent 10/2011 stent rca 2015 with collaterals to lad which is chronically occluded    Benign enlargement of prostate    Benign essential HTN 06/11/2014   Benign prostatic hypertrophy without urinary obstruction 07/31/2014   Bilateral cataracts 05/16/2013   Overview:  [facility]. [facility]     Bone spur of foot    Left   BP (high blood pressure) 11/09/2012   Cancer (HCC)    skin (forehead) and bladder   Carotid artery narrowing 02/08/2014   Depression    Detrusor hypertrophy    Diabetes (HCC)    Diabetes mellitus, type 2 (HCC) 12/12/2012   Diverticulosis    Dyspnea    Esophageal reflux    Esophageal reflux    Fothergill's neuralgia 08/07/2012   Overview:  [facility]    Gastritis    GERD (gastroesophageal reflux disease)    Headache    cluster headaches   Healed myocardial infarct 11/09/2012   Hearing loss in left ear    Heart disease    Hematuria    Hemorrhoids    History of hiatal hernia 12/14/2017   small    Hypercholesteremia    Lesion of bladder    Myocardial infarct (HCC)    Presence of stent in coronary artery 11/09/2012   Rectal bleeding    Trigeminal neuralgia    Trigeminal neuralgia    Valvular heart  disease    Vitamin D deficiency    Past Surgical History:  Procedure Laterality Date   APPENDECTOMY     BOTOX INJECTION N/A 12/21/2017   Procedure: Bladder BOTOX INJECTION;  Surgeon: Vanna Scotland, MD;  Location: [facility] ORS;  Service: Urology;  Laterality: N/A;   BOTOX INJECTION N/A 09/11/2018   Procedure: Bladder BOTOX INJECTION;  Surgeon: Vanna Scotland, MD;  Location: [facility] ORS;  Service: Urology;  Laterality: N/A;   CARDIAC CATHETERIZATION     CARDIAC CATHETERIZATION N/A 01/22/2015   Procedure: Left Heart Cath;  Surgeon: Lamar Blinks, MD;  Location: [facility] INVASIVE CV LAB;  Service: Cardiovascular;  Laterality: N/A;   CARDIAC CATHETERIZATION N/A 01/22/2015   Procedure: Coronary Stent Intervention;  Surgeon: Marcina Millard, MD;  Location: [facility] INVASIVE CV LAB;  Service: Cardiovascular;  Laterality: N/A;   CATARACT EXTRACTION, BILATERAL     COLONOSCOPY WITH PROPOFOL N/A 12/08/2015   Procedure: COLONOSCOPY WITH PROPOFOL;  Surgeon: Christena Deem, MD;  Location: [facility] ENDOSCOPY;  Service: Endoscopy;  Laterality: N/A;   COLONOSCOPY WITH PROPOFOL N/A 12/09/2015   Procedure: COLONOSCOPY WITH PROPOFOL;  Surgeon: Christena Deem, MD;  Location: [facility] ENDOSCOPY;  Service: Endoscopy;  Laterality: N/A;   CORONARY ANGIOPLASTY     5 stents   CORONARY STENT PLACEMENT  2015   x5   CYSTOSCOPY N/A 09/11/2018   Procedure: CYSTOSCOPY;  Surgeon: Vanna Scotland, MD;  Location: [facility] ORS;  Service: Urology;  Laterality: N/A;   CYSTOSCOPY WITH BIOPSY N/A 12/21/2017   Procedure: CYSTOSCOPY WITH BIOPSY;  Surgeon: Vanna Scotland, MD;  Location: [facility] ORS;  Service: Urology;  Laterality: N/A;   ESOPHAGOGASTRODUODENOSCOPY (EGD) WITH PROPOFOL N/A 12/08/2015   Procedure: ESOPHAGOGASTRODUODENOSCOPY (EGD) WITH PROPOFOL;  Surgeon: Christena Deem, MD;  Location: [facility] ENDOSCOPY;  Service: Endoscopy;  Laterality: N/A;   ESOPHAGOGASTRODUODENOSCOPY (EGD) WITH PROPOFOL N/A 12/13/2017   Procedure:  ESOPHAGOGASTRODUODENOSCOPY (EGD) WITH PROPOFOL;  Surgeon: Christena Deem, MD;  Location: [facility] ENDOSCOPY;  Service: Endoscopy;  Laterality: N/A;   EYE SURGERY     HERNIA REPAIR     kidney tumor remove     TRANSURETHRAL RESECTION OF BLADDER TUMOR WITH GYRUS (TURBT-GYRUS)  12/2013   UMBILICAL HERNIA REPAIR     urethral meatotomy     Patient Active Problem List   Diagnosis Date Noted   Class 2 obesity due to excess calories with body mass index (BMI) of 36.0 to 36.9 in adult 04/11/2020   Swelling of limb 03/28/2020   Lymphedema 03/28/2020   Trigeminal neuralgia 12/25/2019   Lower limb ulcer, calf, left, limited to breakdown of skin (HCC) 12/25/2019   OSA (obstructive sleep apnea) 01/23/2019   Acute systolic CHF (congestive heart failure) (HCC) 12/12/2018   Bruising 07/10/2018   Diet-controlled type 2 diabetes mellitus (HCC) 03/24/2018   Microalbuminuria 03/24/2018   Dizziness 11/17/2017   Simple chronic bronchitis (HCC) 09/22/2017   SOBOE (shortness of breath on exertion) 02/18/2017   Chronic midline low back pain without sciatica 12/29/2016   Periodic limb movement disorder 12/29/2016   Benign essential tremor 06/22/2016   Pure hypercholesterolemia 06/20/2015   Erectile dysfunction due to arterial insufficiency 06/16/2015   Unstable angina (HCC) 01/22/2015   Abdominal aortic 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 rupture (HCC) 12/31/2014   Benign prostatic hypertrophy without urinary obstruction 07/31/2014   Enlarged prostate 07/31/2014   Benign essential HTN 06/11/2014   Carotid artery narrowing 02/08/2014   Carotid artery obstruction 02/08/2014   Bilateral carotid artery stenosis 02/08/2014   Chest pain 08/20/2013   Bilateral cataracts 05/16/2013   Cataract 05/16/2013   Clinical depression 12/12/2012   Diabetes mellitus, type 2 (HCC) 12/12/2012   Combined fat and carbohydrate induced hyperlipemia 12/12/2012   Major depressive disorder, single episode, unspecified 12/12/2012   Acid  reflux 11/09/2012   Presence of stent in coronary artery 11/09/2012   BP (high blood pressure) 11/09/2012   Healed myocardial infarct 11/09/2012   Gastro-esophageal reflux disease without esophagitis 11/09/2012   History of cardiovascular surgery 11/09/2012   Fothergill's neuralgia 08/07/2012   Swelling of testicle 04/06/2012   Disorder of male genital organ 04/06/2012   Swelling of the testicles 04/06/2012   Arteriosclerosis of coronary artery 11/16/2011   CAD in native artery 11/16/2011   Fatigue 06/04/2011   Avitaminosis D 11/30/2010    ONSET DATE: 03/17/2022- Worsening symptoms  REFERRING DIAG:  G20.A1 (ICD-10-CM) - Parkinsons  R26.2 (ICD-10-CM) - Difficulty walking    THERAPY DIAG:  Difficulty in walking, not elsewhere classified  Muscle weakness (generalized)  Other lack of coordination  Abnormality of gait and mobility  Other abnormalities of gait and mobility  Unsteadiness on feet  Chronic bilateral low back pain without sciatica  Rationale for Evaluation  and Treatment: Rehabilitation  SUBJECTIVE:                                                                                                                                                                                             SUBJECTIVE STATEMENT: Patient reports he has been struggling with walking for some motnths since successfully completing episode of PT care back in January 2024. He states he mostly walks with RW in the house and using scooter at places like grocery or [employer]. He reports only 1 fall since Jan and no injuries. He states he is weak and feels like he could do better with another round of outpatient PT services.   Pt accompanied by: significant other  PERTINENT HISTORY: Patient is a 77 year old male with referral to outpatient PT for Parkinsons and difficulty walking. He was recently seen by Neurology worsening symptoms of parkinson- difficulty with walking. He has past  medical history of Arthritis, Bladder cancer, andTrigeminal Neuralgia.  PAIN:  Are you having pain? Yes: NPRS scale: 0/10 at rest but up to 8/10 at worst/10 Pain location: Right shoulder and right hip  Pain description: ache and sharp at times Aggravating factors: walking, up and down, overhead reaching with right arm Relieving factors: Rest, pain meds as needed  PRECAUTIONS: Fall  WEIGHT BEARING RESTRICTIONS: No  FALLS: Has patient fallen in last 6 months? Yes. Number of falls 1  LIVING ENVIRONMENT: Lives with: lives with their spouse Lives in: House/apartment Stairs: Yes: External: 2 steps; has grab bar Has following equipment at home: Dan Humphreys - 2 wheeled, Environmental consultant - 4 wheeled, and Grab bars  PLOF: Independent with household mobility with device, Independent with community mobility with device, Independent with transfers, and Requires assistive device for independence  PATIENT GOALS: Get to where I can walk better again and get my legs stronger.   OBJECTIVE:   DIAGNOSTIC FINDINGS: none recent  COGNITION: Overall cognitive status: Within functional limits for tasks assessed   SENSATION: WFL  COORDINATION: Slow at times with initiation with UE and LE movement. Some freezing with turning/mobility.   EDEMA:  Wearing compression stockings   POSTURE: rounded shoulders, forward head, increased thoracic kyphosis, and flexed trunk   LOWER EXTREMITY ROM:     Active  Right Eval Left Eval  Hip flexion Limited <100 Limited < 100  Hip extension    Hip abduction    Hip adduction    Hip internal rotation    Hip external rotation    Knee flexion    Knee extension    Ankle dorsiflexion    Ankle plantarflexion    Ankle  inversion    Ankle eversion     (Blank rows = not tested)  LOWER EXTREMITY MMT:    MMT Right Eval Left Eval  Hip flexion 3+ 3+  Hip extension 4 4  Hip abduction 4 4  Hip adduction 4 4  Hip internal rotation 4 4  Hip external rotation 4 4  Knee  flexion 4 4  Knee extension 4 4-  Ankle dorsiflexion 4 4  Ankle plantarflexion 4 4  Ankle inversion 4 4  Ankle eversion 4 4  (Blank rows = not tested)    TRANSFERS: Assistive device utilized: Environmental consultant - 4 wheeled -Upright Sit to stand: CGA Stand to sit: CGA Chair to chair: CGA Floor:  not assessed   GAIT: Gait pattern: step through pattern, decreased arm swing- Right, decreased arm swing- Left, decreased step length- Right, decreased step length- Left, decreased stance time- Right, decreased stance time- Left, decreased stride length, and shuffling Distance walked: 450 Assistive device utilized: Walker - 4 wheeled-upright Level of assistance: CGA Comments: Shuffling with some freezing with turning and into doorways- increased shuffling with distance.  FUNCTIONAL TESTS:  5 times sit to stand: 17.39 sec Timed up and go (TUG): 22.0 sec 6 minute walk test: 3:45 min and 450 feet total  10 meter walk test: 0.67 m/s Berg Balance Scale: To be assessed next visit  PATIENT SURVEYS:  FOTO 42  TODAY'S TREATMENT:                                                                                                                              DATE: 08/09/2022    PATIENT EDUCATION: Education details: Plan of care; purpose of functional outcome measures.  Person educated: Patient Education method: Explanation, Demonstration, Tactile cues, Verbal cues, and Handouts Education comprehension: verbalized understanding, returned demonstration, verbal cues required, tactile cues required, and needs further education  HOME EXERCISE PROGRAM: To be reviewed/added next 1-2 visits  GOALS: Goals reviewed with patient? Yes  SHORT TERM GOALS: Target date: 09/20/2022  Pt will be independent with a comprehensive HEP in order to improve strength and balance in order to decrease fall risk and improve function at home and work.  Baseline: EVAL: Patient admits not performing much exercises - not moving around  well.  Goal status: INITIAL   LONG TERM GOALS: Target date: 11/01/2022  Pt will improve FOTO to target score of 45 % to display perceived improvements in ability to complete ADL's.  Baseline: EVAL: 42 Goal status: INITIAL  2.  Pt will decrease 5TSTS by at least 5 seconds in order to demonstrate clinically significant improvement in LE strength.  Baseline: EVAL = 17.39 sec without UE support Goal status: INITIAL  3. Pt will increase by at least 0.15 m/s in order to demonstrate clinically significant improvement in community ambulation   Baseline: EVAL= 0.67 m/s Goal status: INITIAL  4.  Pt will improve BERG by at least 3 points in order to demonstrate  clinically significant improvement in balance.   Baseline: EVAL: to be assessed next visit Goal status: INITIAL  5.  Pt will decrease TUG to below 14 seconds/decrease in order to demonstrate decreased fall risk. Baseline: EVAL: 22.00 sec with upright 4WW Goal status: INITIAL  6.  Pt will increase by at least 48m (131ft) in order to demonstrate clinically significant improvement in cardiopulmonary endurance and community ambulation  Baseline: EVAL: 3:45 min (450 feet with upright 4WW) Goal status: INITIAL 7.   Patient will report returning to walking in to community places using most appropriate assistive device vs current use of scooter for improved abilities with mobility during community outings Baseline: EVAL: Current using scooter for many social/community outings  Goal status: INITIAL ASSESSMENT:  CLINICAL IMPRESSION: Patient is a 77 y.o. Male who was seen today for physical therapy evaluation and treatment for Parkinsons disease and difficulty with walking. He is well known to Chartered loss adjuster with previous episode of care. He presents today with increased stiffness and LE muscle weakness and shuffling limited mobility with increased overall fatigue with minimal exertion. He will benefit from skilled PT services to improve his  overall strength and mobility to maximize his independent function while decreasing his fall risk.   OBJECTIVE IMPAIRMENTS: Abnormal gait, cardiopulmonary status limiting activity, decreased activity tolerance, decreased balance, decreased coordination, decreased endurance, decreased mobility, difficulty walking, decreased ROM, decreased strength, hypomobility, impaired flexibility, impaired sensation, impaired UE functional use, postural dysfunction, obesity, and pain.   ACTIVITY LIMITATIONS: carrying, lifting, bending, standing, squatting, stairs, transfers, continence, and reach over head  PARTICIPATION LIMITATIONS: meal prep, cleaning, laundry, driving, shopping, community activity, and yard work  PERSONAL FACTORS: Age and 3+ comorbidities: HTN, Arthritis, Low back pain, Trigeminal Neuralgia  are also affecting patient's functional outcome.   REHAB POTENTIAL: Good  CLINICAL DECISION MAKING: Evolving/moderate complexity  EVALUATION COMPLEXITY: Moderate  PLAN:  PT FREQUENCY: 1-2x/week  PT DURATION: 12 weeks  PLANNED INTERVENTIONS: Therapeutic exercises, Therapeutic activity, Neuromuscular re-education, Balance training, Gait training, Patient/Family education, Self Care, Joint mobilization, Stair training, Vestibular training, Canalith repositioning, DME instructions, Dry Needling, Electrical stimulation, Spinal manipulation, Spinal mobilization, Cryotherapy, Moist heat, Manual therapy, and Re-evaluation  PLAN FOR NEXT SESSION: Assess BERG, Initiate therex, bed mobility, and balance training.    Lenda Kelp, PT 08/10/2022, 7:25 AM

## 2022-08-11 ENCOUNTER — Ambulatory Visit: Payer: Medicare HMO

## 2022-08-11 DIAGNOSIS — M6281 Muscle weakness (generalized): Secondary | ICD-10-CM

## 2022-08-11 DIAGNOSIS — R2681 Unsteadiness on feet: Secondary | ICD-10-CM

## 2022-08-11 DIAGNOSIS — R269 Unspecified abnormalities of gait and mobility: Secondary | ICD-10-CM

## 2022-08-11 DIAGNOSIS — R2689 Other abnormalities of gait and mobility: Secondary | ICD-10-CM

## 2022-08-11 DIAGNOSIS — R262 Difficulty in walking, not elsewhere classified: Secondary | ICD-10-CM | POA: Diagnosis not present

## 2022-08-11 DIAGNOSIS — M545 Low back pain, unspecified: Secondary | ICD-10-CM

## 2022-08-11 DIAGNOSIS — R278 Other lack of coordination: Secondary | ICD-10-CM

## 2022-08-11 NOTE — Therapy (Signed)
OUTPATIENT PHYSICAL THERAPY NEURO TREATMENT   Patient Name: [name] MRN: [id number] DOB:1946/01/18, 77 y.o., male Today's Date: 08/11/2022   PCP: Ardyth Man, PA- C REFERRING PROVIDER: Morene Crocker, MD  END OF SESSION:  PT End of Session - 08/11/22 1051     Visit Number 2    Number of Visits 24    Date for PT Re-Evaluation 11/01/22    Progress Note Due on Visit 10    PT Start Time 1053    PT Stop Time 1140    PT Time Calculation (min) 47 min    Equipment Utilized During Treatment Gait belt    Activity Tolerance Patient tolerated treatment well    Behavior During Therapy WFL for tasks assessed/performed              Past Medical History:  Diagnosis Date   Anxiety    Arteriosclerosis of coronary artery 11/16/2011   Overview:  Stent 10/2011 stent rca 2015 with collaterals to lad which is chronically occluded    Benign enlargement of prostate    Benign essential HTN 06/11/2014   Benign prostatic hypertrophy without urinary obstruction 07/31/2014   Bilateral cataracts 05/16/2013   Overview:  [facility]     Bone spur of foot    Left   BP (high blood pressure) 11/09/2012   Cancer (HCC)    skin (forehead) and bladder   Carotid artery narrowing 02/08/2014   Depression    Detrusor hypertrophy    Diabetes (HCC)    Diabetes mellitus, type 2 (HCC) 12/12/2012   Diverticulosis    Dyspnea    Esophageal reflux    Esophageal reflux    Fothergill's neuralgia 08/07/2012   Overview:  [facility]    Gastritis    GERD (gastroesophageal reflux disease)    Headache    cluster headaches   Healed myocardial infarct 11/09/2012   Hearing loss in left ear    Heart disease    Hematuria    Hemorrhoids    History of hiatal hernia 12/14/2017   small    Hypercholesteremia    Lesion of bladder    Myocardial infarct (HCC)    Presence of stent in coronary artery 11/09/2012   Rectal bleeding    Trigeminal neuralgia    Trigeminal neuralgia    Valvular heart  disease    Vitamin D deficiency    Past Surgical History:  Procedure Laterality Date   APPENDECTOMY     BOTOX INJECTION N/A 12/21/2017   Procedure: Bladder BOTOX INJECTION;  Surgeon: Vanna Scotland, MD;  Location: [facility] ORS;  Service: Urology;  Laterality: N/A;   BOTOX INJECTION N/A 09/11/2018   Procedure: Bladder BOTOX INJECTION;  Surgeon: Vanna Scotland, MD;  Location: [facility] ORS;  Service: Urology;  Laterality: N/A;   CARDIAC CATHETERIZATION     CARDIAC CATHETERIZATION N/A 01/22/2015   Procedure: Left Heart Cath;  Surgeon: Lamar Blinks, MD;  Location: [facility] INVASIVE CV LAB;  Service: Cardiovascular;  Laterality: N/A;   CARDIAC CATHETERIZATION N/A 01/22/2015   Procedure: Coronary Stent Intervention;  Surgeon: Marcina Millard, MD;  Location: [facility] INVASIVE CV LAB;  Service: Cardiovascular;  Laterality: N/A;   CATARACT EXTRACTION, BILATERAL     COLONOSCOPY WITH PROPOFOL N/A 12/08/2015   Procedure: COLONOSCOPY WITH PROPOFOL;  Surgeon: Christena Deem, MD;  Location: [facility] ENDOSCOPY;  Service: Endoscopy;  Laterality: N/A;   COLONOSCOPY WITH PROPOFOL N/A 12/09/2015   Procedure: COLONOSCOPY WITH PROPOFOL;  Surgeon: Christena Deem, MD;  Location: [facility]  ENDOSCOPY;  Service: Endoscopy;  Laterality: N/A;   CORONARY ANGIOPLASTY     5 stents   CORONARY STENT PLACEMENT  2015   x5   CYSTOSCOPY N/A 09/11/2018   Procedure: CYSTOSCOPY;  Surgeon: Vanna Scotland, MD;  Location: [facility] ORS;  Service: Urology;  Laterality: N/A;   CYSTOSCOPY WITH BIOPSY N/A 12/21/2017   Procedure: CYSTOSCOPY WITH BIOPSY;  Surgeon: Vanna Scotland, MD;  Location: [facility] ORS;  Service: Urology;  Laterality: N/A;   ESOPHAGOGASTRODUODENOSCOPY (EGD) WITH PROPOFOL N/A 12/08/2015   Procedure: ESOPHAGOGASTRODUODENOSCOPY (EGD) WITH PROPOFOL;  Surgeon: Christena Deem, MD;  Location: [facility] ENDOSCOPY;  Service: Endoscopy;  Laterality: N/A;   ESOPHAGOGASTRODUODENOSCOPY (EGD) WITH PROPOFOL N/A 12/13/2017   Procedure:  ESOPHAGOGASTRODUODENOSCOPY (EGD) WITH PROPOFOL;  Surgeon: Christena Deem, MD;  Location: [facility] ENDOSCOPY;  Service: Endoscopy;  Laterality: N/A;   EYE SURGERY     HERNIA REPAIR     kidney tumor remove     TRANSURETHRAL RESECTION OF BLADDER TUMOR WITH GYRUS (TURBT-GYRUS)  12/2013   UMBILICAL HERNIA REPAIR     urethral meatotomy     Patient Active Problem List   Diagnosis Date Noted   Class 2 obesity due to excess calories with body mass index (BMI) of 36.0 to 36.9 in adult 04/11/2020   Swelling of limb 03/28/2020   Lymphedema 03/28/2020   Trigeminal neuralgia 12/25/2019   Lower limb ulcer, calf, left, limited to breakdown of skin (HCC) 12/25/2019   OSA (obstructive sleep apnea) 01/23/2019   Acute systolic CHF (congestive heart failure) (HCC) 12/12/2018   Bruising 07/10/2018   Diet-controlled type 2 diabetes mellitus (HCC) 03/24/2018   Microalbuminuria 03/24/2018   Dizziness 11/17/2017   Simple chronic bronchitis (HCC) 09/22/2017   SOBOE (shortness of breath on exertion) 02/18/2017   Chronic midline low back pain without sciatica 12/29/2016   Periodic limb movement disorder 12/29/2016   Benign essential tremor 06/22/2016   Pure hypercholesterolemia 06/20/2015   Erectile dysfunction due to arterial insufficiency 06/16/2015   Unstable angina (HCC) 01/22/2015   Abdominal aortic 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 rupture (HCC) 12/31/2014   Benign prostatic hypertrophy without urinary obstruction 07/31/2014   Enlarged prostate 07/31/2014   Benign essential HTN 06/11/2014   Carotid artery narrowing 02/08/2014   Carotid artery obstruction 02/08/2014   Bilateral carotid artery stenosis 02/08/2014   Chest pain 08/20/2013   Bilateral cataracts 05/16/2013   Cataract 05/16/2013   Clinical depression 12/12/2012   Diabetes mellitus, type 2 (HCC) 12/12/2012   Combined fat and carbohydrate induced hyperlipemia 12/12/2012   Major depressive disorder, single episode, unspecified 12/12/2012   Acid  reflux 11/09/2012   Presence of stent in coronary artery 11/09/2012   BP (high blood pressure) 11/09/2012   Healed myocardial infarct 11/09/2012   Gastro-esophageal reflux disease without esophagitis 11/09/2012   History of cardiovascular surgery 11/09/2012   Fothergill's neuralgia 08/07/2012   Swelling of testicle 04/06/2012   Disorder of male genital organ 04/06/2012   Swelling of the testicles 04/06/2012   Arteriosclerosis of coronary artery 11/16/2011   CAD in native artery 11/16/2011   Fatigue 06/04/2011   Avitaminosis D 11/30/2010    ONSET DATE: 03/17/2022- Worsening symptoms  REFERRING DIAG:  G20.A1 (ICD-10-CM) - Parkinsons  R26.2 (ICD-10-CM) - Difficulty walking    THERAPY DIAG:  Difficulty in walking, not elsewhere classified  Muscle weakness (generalized)  Other lack of coordination  Abnormality of gait and mobility  Other abnormalities of gait and mobility  Unsteadiness on feet  Chronic bilateral low back pain without sciatica  Rationale  for Evaluation and Treatment: Rehabilitation  SUBJECTIVE:                                                                                                                                                                                             SUBJECTIVE STATEMENT: Patient reports ongoing right shoulder and low back pain today and going to MD right after visit.     Pt accompanied by: significant other  PERTINENT HISTORY: Patient is a 77 year old male with referral to outpatient PT for Parkinsons and difficulty walking. He was recently seen by Neurology worsening symptoms of parkinson- difficulty with walking. He has past medical history of Arthritis, Bladder cancer, andTrigeminal Neuralgia.  PAIN:  Are you having pain? Yes: NPRS scale: 0/10 at rest but up to 8/10 at worst/10 Pain location: Right shoulder and right hip  Pain description: ache and sharp at times Aggravating factors: walking, up and down, overhead  reaching with right arm Relieving factors: Rest, pain meds as needed  PRECAUTIONS: Fall  WEIGHT BEARING RESTRICTIONS: No  FALLS: Has patient fallen in last 6 months? Yes. Number of falls 1  LIVING ENVIRONMENT: Lives with: lives with their spouse Lives in: House/apartment Stairs: Yes: External: 2 steps; has grab bar Has following equipment at home: Dan Humphreys - 2 wheeled, Environmental consultant - 4 wheeled, and Grab bars  PLOF: Independent with household mobility with device, Independent with community mobility with device, Independent with transfers, and Requires assistive device for independence  PATIENT GOALS: Get to where I can walk better again and get my legs stronger.   OBJECTIVE:   DIAGNOSTIC FINDINGS: none recent  COGNITION: Overall cognitive status: Within functional limits for tasks assessed   SENSATION: WFL  COORDINATION: Slow at times with initiation with UE and LE movement. Some freezing with turning/mobility.   EDEMA:  Wearing compression stockings   POSTURE: rounded shoulders, forward head, increased thoracic kyphosis, and flexed trunk   LOWER EXTREMITY ROM:     Active  Right Eval Left Eval  Hip flexion Limited <100 Limited < 100  Hip extension    Hip abduction    Hip adduction    Hip internal rotation    Hip external rotation    Knee flexion    Knee extension    Ankle dorsiflexion    Ankle plantarflexion    Ankle inversion    Ankle eversion     (Blank rows = not tested)  LOWER EXTREMITY MMT:    MMT Right Eval Left Eval  Hip flexion 3+ 3+  Hip extension 4 4  Hip abduction 4 4  Hip adduction 4 4  Hip internal rotation  4 4  Hip external rotation 4 4  Knee flexion 4 4  Knee extension 4 4-  Ankle dorsiflexion 4 4  Ankle plantarflexion 4 4  Ankle inversion 4 4  Ankle eversion 4 4  (Blank rows = not tested)    TRANSFERS: Assistive device utilized: Environmental consultant - 4 wheeled -Upright Sit to stand: CGA Stand to sit: CGA Chair to chair: CGA Floor:  not  assessed   GAIT: Gait pattern: step through pattern, decreased arm swing- Right, decreased arm swing- Left, decreased step length- Right, decreased step length- Left, decreased stance time- Right, decreased stance time- Left, decreased stride length, and shuffling Distance walked: 450 Assistive device utilized: Walker - 4 wheeled-upright Level of assistance: CGA Comments: Shuffling with some freezing with turning and into doorways- increased shuffling with distance.  FUNCTIONAL TESTS:  5 times sit to stand: 17.39 sec Timed up and go (TUG): 22.0 sec 6 minute walk test: 3:45 min and 450 feet total  10 meter walk test: 0.67 m/s Berg Balance Scale: To be assessed next visit  PATIENT SURVEYS:  FOTO 42  TODAY'S TREATMENT:                                                                                                                              DATE: 08/11/2022   Therex:   Patient performed seated lumbar flex- hold 30 sec x 4 Seated thoracic rotation- hold 20 sec x 4 each direction Seated hamstring stretch hold 30 sec x 4 each LE Seated Sciatic nerve flossing x 20 reps each LE  Seated figure 4 stretch- hold 30 sec x 4   Sit to stand: 2 sets of 10 reps without UE support  Ambulation in hallway with upright 4WW for 5 min 26 sec - focusing on 180 deg turning and reciprocal stepping. Patient did initiate more shuffle     PATIENT EDUCATION: Education details: Plan of care; purpose of functional outcome measures.  Person educated: Patient Education method: Explanation, Demonstration, Tactile cues, Verbal cues, and Handouts Education comprehension: verbalized understanding, returned demonstration, verbal cues required, tactile cues required, and needs further education  HOME EXERCISE PROGRAM: Access Code: [email or web address] URL: [email or web address] Date: 08/11/2022 Prepared by: Maureen Ralphs  Exercises - Seated Lumbar Flexion Stretch  - 7 x weekly - 3 sets - 30 sec  hold - Seated Thoracic Flexion and Rotation with Arms Crossed  - 1 x daily - 7 x weekly - 3 sets - 30 sec hold - Seated Figure 4 Piriformis Stretch  - 1 x daily - 7 x weekly - 3 sets - 10 reps  GOALS: Goals reviewed with patient? Yes  SHORT TERM GOALS: Target date: 09/20/2022  Pt will be independent with a comprehensive HEP in order to improve strength and balance in order to decrease fall risk and improve function at home and work.  Baseline: EVAL: Patient admits not performing much exercises - not moving around well.  Goal status: INITIAL  LONG TERM GOALS: Target date: 11/01/2022  Pt will improve FOTO to target score of 45 % to display perceived improvements in ability to complete ADL's.  Baseline: EVAL: 42 Goal status: INITIAL  2.  Pt will decrease 5TSTS by at least 5 seconds in order to demonstrate clinically significant improvement in LE strength.  Baseline: EVAL = 17.39 sec without UE support Goal status: INITIAL  3. Pt will increase by at least 0.15 m/s in order to demonstrate clinically significant improvement in community ambulation   Baseline: EVAL= 0.67 m/s Goal status: INITIAL  4.  Pt will improve BERG by at least 3 points in order to demonstrate clinically significant improvement in balance.   Baseline: EVAL: to be assessed next visit Goal status: INITIAL  5.  Pt will decrease TUG to below 14 seconds/decrease in order to demonstrate decreased fall risk. Baseline: EVAL: 22.00 sec with upright 4WW Goal status: INITIAL  6.  Pt will increase by at least 70m (180ft) in order to demonstrate clinically significant improvement in cardiopulmonary endurance and community ambulation  Baseline: EVAL: 3:45 min (450 feet with upright 4WW) Goal status: INITIAL 7.   Patient will report returning to walking in to community places using most appropriate assistive device vs current use of scooter for improved abilities with mobility during community outings Baseline: EVAL:  Current using scooter for many social/community outings  Goal status: INITIAL ASSESSMENT:  CLINICAL IMPRESSION: Treatment focused on instructing in basic low back/trunk/LE stretches in seated position and began endurance training. He continues to present with increased rigidity and will benefit from further training in flexibility. He is also very limited with endurance and instructed to try to walk more on his own at home. PT will continue to benefit from skilled PT services to improve his overall strength and mobility to maximize his independent function while decreasing his fall risk.   OBJECTIVE IMPAIRMENTS: Abnormal gait, cardiopulmonary status limiting activity, decreased activity tolerance, decreased balance, decreased coordination, decreased endurance, decreased mobility, difficulty walking, decreased ROM, decreased strength, hypomobility, impaired flexibility, impaired sensation, impaired UE functional use, postural dysfunction, obesity, and pain.   ACTIVITY LIMITATIONS: carrying, lifting, bending, standing, squatting, stairs, transfers, continence, and reach over head  PARTICIPATION LIMITATIONS: meal prep, cleaning, laundry, driving, shopping, community activity, and yard work  PERSONAL FACTORS: Age and 3+ comorbidities: HTN, Arthritis, Low back pain, Trigeminal Neuralgia  are also affecting patient's functional outcome.   REHAB POTENTIAL: Good  CLINICAL DECISION MAKING: Evolving/moderate complexity  EVALUATION COMPLEXITY: Moderate  PLAN:  PT FREQUENCY: 1-2x/week  PT DURATION: 12 weeks  PLANNED INTERVENTIONS: Therapeutic exercises, Therapeutic activity, Neuromuscular re-education, Balance training, Gait training, Patient/Family education, Self Care, Joint mobilization, Stair training, Vestibular training, Canalith repositioning, DME instructions, Dry Needling, Electrical stimulation, Spinal manipulation, Spinal mobilization, Cryotherapy, Moist heat, Manual therapy, and  Re-evaluation  PLAN FOR NEXT SESSION: Assess BERG, Initiate therex, bed mobility, and balance training.    Lenda Kelp, PT 08/11/2022, 1:07 PM

## 2022-08-17 ENCOUNTER — Ambulatory Visit: Payer: Medicare HMO

## 2022-08-17 DIAGNOSIS — R262 Difficulty in walking, not elsewhere classified: Secondary | ICD-10-CM

## 2022-08-17 DIAGNOSIS — R278 Other lack of coordination: Secondary | ICD-10-CM

## 2022-08-17 DIAGNOSIS — M6281 Muscle weakness (generalized): Secondary | ICD-10-CM

## 2022-08-17 DIAGNOSIS — R2689 Other abnormalities of gait and mobility: Secondary | ICD-10-CM

## 2022-08-17 DIAGNOSIS — R269 Unspecified abnormalities of gait and mobility: Secondary | ICD-10-CM

## 2022-08-17 NOTE — Therapy (Signed)
OUTPATIENT PHYSICAL THERAPY NEURO TREATMENT   Patient Name: [name] MRN: [id number] DOB:1945/12/13, 77 y.o., male Today's Date: 08/18/2022   PCP: Ardyth Man, PA- C REFERRING PROVIDER: Morene Crocker, MD  END OF SESSION:  PT End of Session - 08/17/22 1351     Visit Number 3    Number of Visits 24    Date for PT Re-Evaluation 11/01/22    Progress Note Due on Visit 10    PT Start Time 1347    PT Stop Time 1429    PT Time Calculation (min) 42 min    Equipment Utilized During Treatment Gait belt    Activity Tolerance Patient tolerated treatment well    Behavior During Therapy [facility] for tasks assessed/performed              Past Medical History:  Diagnosis Date   Anxiety    Arteriosclerosis of coronary artery 11/16/2011   Overview:  Stent 10/2011 stent rca 2015 with collaterals to lad which is chronically occluded    Benign enlargement of prostate    Benign essential HTN 06/11/2014   Benign prostatic hypertrophy without urinary obstruction 07/31/2014   Bilateral cataracts 05/16/2013   Overview:  [facility]     Bone spur of foot    Left   BP (high blood pressure) 11/09/2012   Cancer (HCC)    skin (forehead) and bladder   Carotid artery narrowing 02/08/2014   Depression    Detrusor hypertrophy    Diabetes (HCC)    Diabetes mellitus, type 2 (HCC) 12/12/2012   Diverticulosis    Dyspnea    Esophageal reflux    Esophageal reflux    Fothergill's neuralgia 08/07/2012   Overview:  [facility]    Gastritis    GERD (gastroesophageal reflux disease)    Headache    cluster headaches   Healed myocardial infarct 11/09/2012   Hearing loss in left ear    Heart disease    Hematuria    Hemorrhoids    History of hiatal hernia 12/14/2017   small    Hypercholesteremia    Lesion of bladder    Myocardial infarct (HCC)    Presence of stent in coronary artery 11/09/2012   Rectal bleeding    Trigeminal neuralgia    Trigeminal neuralgia    Valvular heart  disease    Vitamin D deficiency    Past Surgical History:  Procedure Laterality Date   APPENDECTOMY     BOTOX INJECTION N/A 12/21/2017   Procedure: Bladder BOTOX INJECTION;  Surgeon: Vanna Scotland, MD;  Location: [facility] ORS;  Service: Urology;  Laterality: N/A;   BOTOX INJECTION N/A 09/11/2018   Procedure: Bladder BOTOX INJECTION;  Surgeon: Vanna Scotland, MD;  Location: [facility] ORS;  Service: Urology;  Laterality: N/A;   CARDIAC CATHETERIZATION     CARDIAC CATHETERIZATION N/A 01/22/2015   Procedure: Left Heart Cath;  Surgeon: Lamar Blinks, MD;  Location: [facility] INVASIVE CV LAB;  Service: Cardiovascular;  Laterality: N/A;   CARDIAC CATHETERIZATION N/A 01/22/2015   Procedure: Coronary Stent Intervention;  Surgeon: Marcina Millard, MD;  Location: [facility] INVASIVE CV LAB;  Service: Cardiovascular;  Laterality: N/A;   CATARACT EXTRACTION, BILATERAL     COLONOSCOPY WITH PROPOFOL N/A 12/08/2015   Procedure: COLONOSCOPY WITH PROPOFOL;  Surgeon: Christena Deem, MD;  Location: [facility] ENDOSCOPY;  Service: Endoscopy;  Laterality: N/A;   COLONOSCOPY WITH PROPOFOL N/A 12/09/2015   Procedure: COLONOSCOPY WITH PROPOFOL;  Surgeon: Christena Deem, MD;  Location: [facility]  ENDOSCOPY;  Service: Endoscopy;  Laterality: N/A;   CORONARY ANGIOPLASTY     5 stents   CORONARY STENT PLACEMENT  2015   x5   CYSTOSCOPY N/A 09/11/2018   Procedure: CYSTOSCOPY;  Surgeon: Vanna Scotland, MD;  Location: [facility] ORS;  Service: Urology;  Laterality: N/A;   CYSTOSCOPY WITH BIOPSY N/A 12/21/2017   Procedure: CYSTOSCOPY WITH BIOPSY;  Surgeon: Vanna Scotland, MD;  Location: [facility] ORS;  Service: Urology;  Laterality: N/A;   ESOPHAGOGASTRODUODENOSCOPY (EGD) WITH PROPOFOL N/A 12/08/2015   Procedure: ESOPHAGOGASTRODUODENOSCOPY (EGD) WITH PROPOFOL;  Surgeon: Christena Deem, MD;  Location: [facility] ENDOSCOPY;  Service: Endoscopy;  Laterality: N/A;   ESOPHAGOGASTRODUODENOSCOPY (EGD) WITH PROPOFOL N/A 12/13/2017   Procedure:  ESOPHAGOGASTRODUODENOSCOPY (EGD) WITH PROPOFOL;  Surgeon: Christena Deem, MD;  Location: [facility] ENDOSCOPY;  Service: Endoscopy;  Laterality: N/A;   EYE SURGERY     HERNIA REPAIR     kidney tumor remove     TRANSURETHRAL RESECTION OF BLADDER TUMOR WITH GYRUS (TURBT-GYRUS)  12/2013   UMBILICAL HERNIA REPAIR     urethral meatotomy     Patient Active Problem List   Diagnosis Date Noted   Class 2 obesity due to excess calories with body mass index (BMI) of 36.0 to 36.9 in adult 04/11/2020   Swelling of limb 03/28/2020   Lymphedema 03/28/2020   Trigeminal neuralgia 12/25/2019   Lower limb ulcer, calf, left, limited to breakdown of skin (HCC) 12/25/2019   OSA (obstructive sleep apnea) 01/23/2019   Acute systolic CHF (congestive heart failure) (HCC) 12/12/2018   Bruising 07/10/2018   Diet-controlled type 2 diabetes mellitus (HCC) 03/24/2018   Microalbuminuria 03/24/2018   Dizziness 11/17/2017   Simple chronic bronchitis (HCC) 09/22/2017   SOBOE (shortness of breath on exertion) 02/18/2017   Chronic midline low back pain without sciatica 12/29/2016   Periodic limb movement disorder 12/29/2016   Benign essential tremor 06/22/2016   Pure hypercholesterolemia 06/20/2015   Erectile dysfunction due to arterial insufficiency 06/16/2015   Unstable angina (HCC) 01/22/2015   Abdominal aortic 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 rupture (HCC) 12/31/2014   Benign prostatic hypertrophy without urinary obstruction 07/31/2014   Enlarged prostate 07/31/2014   Benign essential HTN 06/11/2014   Carotid artery narrowing 02/08/2014   Carotid artery obstruction 02/08/2014   Bilateral carotid artery stenosis 02/08/2014   Chest pain 08/20/2013   Bilateral cataracts 05/16/2013   Cataract 05/16/2013   Clinical depression 12/12/2012   Diabetes mellitus, type 2 (HCC) 12/12/2012   Combined fat and carbohydrate induced hyperlipemia 12/12/2012   Major depressive disorder, single episode, unspecified 12/12/2012   Acid  reflux 11/09/2012   Presence of stent in coronary artery 11/09/2012   BP (high blood pressure) 11/09/2012   Healed myocardial infarct 11/09/2012   Gastro-esophageal reflux disease without esophagitis 11/09/2012   History of cardiovascular surgery 11/09/2012   Fothergill's neuralgia 08/07/2012   Swelling of testicle 04/06/2012   Disorder of male genital organ 04/06/2012   Swelling of the testicles 04/06/2012   Arteriosclerosis of coronary artery 11/16/2011   CAD in native artery 11/16/2011   Fatigue 06/04/2011   Avitaminosis D 11/30/2010    ONSET DATE: 03/17/2022- Worsening symptoms  REFERRING DIAG:  G20.A1 (ICD-10-CM) - Parkinsons  R26.2 (ICD-10-CM) - Difficulty walking    THERAPY DIAG:  Difficulty in walking, not elsewhere classified  Muscle weakness (generalized)  Other lack of coordination  Abnormality of gait and mobility  Other abnormalities of gait and mobility  Rationale for Evaluation and Treatment: Rehabilitation  SUBJECTIVE:  SUBJECTIVE STATEMENT: Patient reports he did receive another corizone injection in right shoulder and no improvement as of yet.     Pt accompanied by: significant other  PERTINENT HISTORY: Patient is a 77 year old male with referral to outpatient PT for Parkinsons and difficulty walking. He was recently seen by Neurology worsening symptoms of parkinson- difficulty with walking. He has past medical history of Arthritis, Bladder cancer, andTrigeminal Neuralgia.  PAIN:  Are you having pain? Yes: NPRS scale: 0/10 at rest but up to 8/10 at worst/10 Pain location: Right shoulder and right hip  Pain description: ache and sharp at times Aggravating factors: walking, up and down, overhead reaching with right arm Relieving factors: Rest, pain meds as  needed  PRECAUTIONS: Fall  WEIGHT BEARING RESTRICTIONS: No  FALLS: Has patient fallen in last 6 months? Yes. Number of falls 1  LIVING ENVIRONMENT: Lives with: lives with their spouse Lives in: House/apartment Stairs: Yes: External: 2 steps; has grab bar Has following equipment at home: Dan Humphreys - 2 wheeled, Environmental consultant - 4 wheeled, and Grab bars  PLOF: Independent with household mobility with device, Independent with community mobility with device, Independent with transfers, and Requires assistive device for independence  PATIENT GOALS: Get to where I can walk better again and get my legs stronger.   OBJECTIVE:   DIAGNOSTIC FINDINGS: none recent  COGNITION: Overall cognitive status: Within functional limits for tasks assessed   SENSATION: WFL  COORDINATION: Slow at times with initiation with UE and LE movement. Some freezing with turning/mobility.   EDEMA:  Wearing compression stockings   POSTURE: rounded shoulders, forward head, increased thoracic kyphosis, and flexed trunk   LOWER EXTREMITY ROM:     Active  Right Eval Left Eval  Hip flexion Limited <100 Limited < 100  Hip extension    Hip abduction    Hip adduction    Hip internal rotation    Hip external rotation    Knee flexion    Knee extension    Ankle dorsiflexion    Ankle plantarflexion    Ankle inversion    Ankle eversion     (Blank rows = not tested)  LOWER EXTREMITY MMT:    MMT Right Eval Left Eval  Hip flexion 3+ 3+  Hip extension 4 4  Hip abduction 4 4  Hip adduction 4 4  Hip internal rotation 4 4  Hip external rotation 4 4  Knee flexion 4 4  Knee extension 4 4-  Ankle dorsiflexion 4 4  Ankle plantarflexion 4 4  Ankle inversion 4 4  Ankle eversion 4 4  (Blank rows = not tested)    TRANSFERS: Assistive device utilized: Environmental consultant - 4 wheeled -Upright Sit to stand: CGA Stand to sit: CGA Chair to chair: CGA Floor:  not assessed   GAIT: Gait pattern: step through pattern, decreased  arm swing- Right, decreased arm swing- Left, decreased step length- Right, decreased step length- Left, decreased stance time- Right, decreased stance time- Left, decreased stride length, and shuffling Distance walked: 450 Assistive device utilized: Walker - 4 wheeled-upright Level of assistance: CGA Comments: Shuffling with some freezing with turning and into doorways- increased shuffling with distance.  FUNCTIONAL TESTS:  5 times sit to stand: 17.39 sec Timed up and go (TUG): 22.0 sec 6 minute walk test: 3:45 min and 450 feet total  10 meter walk test: 0.67 m/s Berg Balance Scale: To be assessed next visit  PATIENT SURVEYS:  FOTO 42  TODAY'S TREATMENT:  DATE: 08/17/2022   Therex:   Seated thoracic rotation with head turns- left to right x 20 reps  Seated hip flex x 20 reps (VC for height)   Seated hip swings (lifting LE up/over cone) x 15 reps. Patient reports as very hard - knocking over cone several times.  Seated knee ext alt LE x 20 reps (patient fatigued- verbal cues to terminally ext knees)   Seated heel raises on 1/2 foam x 20 reps Seated toe raises on 1/2 foam x 20 reps  Sit to stand 2 sets x 10 reps (VC for technique to lean forward - much improved with corrected technique)    Ambulation in hallway with upright 4WW for 300 feet - Concentrating on erect posture and foot clearance- Increased foot drag with distance)     PATIENT EDUCATION: Education details: Plan of care; purpose of functional outcome measures.  Person educated: Patient Education method: Explanation, Demonstration, Tactile cues, Verbal cues, and Handouts Education comprehension: verbalized understanding, returned demonstration, verbal cues required, tactile cues required, and needs further education  HOME EXERCISE PROGRAM: Access Code: [email or web address] URL:  [email or web address] Date: 08/11/2022 Prepared by: Maureen Ralphs  Exercises - Seated Lumbar Flexion Stretch  - 7 x weekly - 3 sets - 30 sec hold - Seated Thoracic Flexion and Rotation with Arms Crossed  - 1 x daily - 7 x weekly - 3 sets - 30 sec hold - Seated Figure 4 Piriformis Stretch  - 1 x daily - 7 x weekly - 3 sets - 10 reps  GOALS: Goals reviewed with patient? Yes  SHORT TERM GOALS: Target date: 09/20/2022  Pt will be independent with a comprehensive HEP in order to improve strength and balance in order to decrease fall risk and improve function at home and work.  Baseline: EVAL: Patient admits not performing much exercises - not moving around well.  Goal status: INITIAL   LONG TERM GOALS: Target date: 11/01/2022  Pt will improve FOTO to target score of 45 % to display perceived improvements in ability to complete ADL's.  Baseline: EVAL: 42 Goal status: INITIAL  2.  Pt will decrease 5TSTS by at least 5 seconds in order to demonstrate clinically significant improvement in LE strength.  Baseline: EVAL = 17.39 sec without UE support Goal status: INITIAL  3. Pt will increase by at least 0.15 m/s in order to demonstrate clinically significant improvement in community ambulation   Baseline: EVAL= 0.67 m/s Goal status: INITIAL  4.  Pt will improve BERG by at least 3 points in order to demonstrate clinically significant improvement in balance.   Baseline: EVAL: to be assessed next visit Goal status: INITIAL  5.  Pt will decrease TUG to below 14 seconds/decrease in order to demonstrate decreased fall risk. Baseline: EVAL: 22.00 sec with upright 4WW Goal status: INITIAL  6.  Pt will increase by at least 36m (141ft) in order to demonstrate clinically significant improvement in cardiopulmonary endurance and community ambulation  Baseline: EVAL: 3:45 min (450 feet with upright 4WW) Goal status: INITIAL 7.   Patient will report returning to walking in  to community places using most appropriate assistive device vs current use of scooter for improved abilities with mobility during community outings Baseline: EVAL: Current using scooter for many social/community outings  Goal status: INITIAL ASSESSMENT:  CLINICAL IMPRESSION: Treatment focused on continuing with  basic low back/trunk/LE stretches in seated position.  He was able to complete all seated therex with some exertion requiring brief rest breaks. All  therex geared toward progressing weaker LE musculature and improving his overall gait endurance. He was able to increase work load with more exercises today.  PT will continue to benefit from skilled PT services to improve his overall strength and mobility to maximize his independent function while decreasing his fall risk.   OBJECTIVE IMPAIRMENTS: Abnormal gait, cardiopulmonary status limiting activity, decreased activity tolerance, decreased balance, decreased coordination, decreased endurance, decreased mobility, difficulty walking, decreased ROM, decreased strength, hypomobility, impaired flexibility, impaired sensation, impaired UE functional use, postural dysfunction, obesity, and pain.   ACTIVITY LIMITATIONS: carrying, lifting, bending, standing, squatting, stairs, transfers, continence, and reach over head  PARTICIPATION LIMITATIONS: meal prep, cleaning, laundry, driving, shopping, community activity, and yard work  PERSONAL FACTORS: Age and 3+ comorbidities: HTN, Arthritis, Low back pain, Trigeminal Neuralgia  are also affecting patient's functional outcome.   REHAB POTENTIAL: Good  CLINICAL DECISION MAKING: Evolving/moderate complexity  EVALUATION COMPLEXITY: Moderate  PLAN:  PT FREQUENCY: 1-2x/week  PT DURATION: 12 weeks  PLANNED INTERVENTIONS: Therapeutic exercises, Therapeutic activity, Neuromuscular re-education, Balance training, Gait training, Patient/Family education, Self Care, Joint mobilization, Stair training,  Vestibular training, Canalith repositioning, DME instructions, Dry Needling, Electrical stimulation, Spinal manipulation, Spinal mobilization, Cryotherapy, Moist heat, Manual therapy, and Re-evaluation  PLAN FOR NEXT SESSION: Assess BERG, Initiate therex, bed mobility, and balance training.    Lenda Kelp, PT 08/18/2022, 11:41 AM

## 2022-08-20 ENCOUNTER — Ambulatory Visit: Payer: Medicare HMO

## 2022-08-20 DIAGNOSIS — R262 Difficulty in walking, not elsewhere classified: Secondary | ICD-10-CM

## 2022-08-20 DIAGNOSIS — R2689 Other abnormalities of gait and mobility: Secondary | ICD-10-CM

## 2022-08-20 DIAGNOSIS — M545 Low back pain, unspecified: Secondary | ICD-10-CM

## 2022-08-20 DIAGNOSIS — M6281 Muscle weakness (generalized): Secondary | ICD-10-CM

## 2022-08-20 DIAGNOSIS — R2681 Unsteadiness on feet: Secondary | ICD-10-CM

## 2022-08-20 DIAGNOSIS — R269 Unspecified abnormalities of gait and mobility: Secondary | ICD-10-CM

## 2022-08-20 DIAGNOSIS — R278 Other lack of coordination: Secondary | ICD-10-CM

## 2022-08-20 NOTE — Therapy (Signed)
OUTPATIENT PHYSICAL THERAPY NEURO TREATMENT   Patient Name: [name] MRN: [id number] DOB:06-10-1945, 77 y.o., male Today's Date: 08/20/2022   PCP: Ardyth Man, PA- C REFERRING PROVIDER: Morene Crocker, MD  END OF SESSION:  PT End of Session - 08/20/22 0839     Visit Number 4    Number of Visits 24    Date for PT Re-Evaluation 11/01/22    Progress Note Due on Visit 10    PT Start Time 0840    PT Stop Time 0925    PT Time Calculation (min) 45 min    Equipment Utilized During Treatment Gait belt    Activity Tolerance Patient tolerated treatment well    Behavior During Therapy [facility] for tasks assessed/performed              Past Medical History:  Diagnosis Date   Anxiety    Arteriosclerosis of coronary artery 11/16/2011   Overview:  Stent 10/2011 stent rca 2015 with collaterals to lad which is chronically occluded    Benign enlargement of prostate    Benign essential HTN 06/11/2014   Benign prostatic hypertrophy without urinary obstruction 07/31/2014   Bilateral cataracts 05/16/2013   Overview:  [facility]     Bone spur of foot    Left   BP (high blood pressure) 11/09/2012   Cancer (HCC)    skin (forehead) and bladder   Carotid artery narrowing 02/08/2014   Depression    Detrusor hypertrophy    Diabetes (HCC)    Diabetes mellitus, type 2 (HCC) 12/12/2012   Diverticulosis    Dyspnea    Esophageal reflux    Esophageal reflux    Fothergill's neuralgia 08/07/2012   Overview:  [facility]    Gastritis    GERD (gastroesophageal reflux disease)    Headache    cluster headaches   Healed myocardial infarct 11/09/2012   Hearing loss in left ear    Heart disease    Hematuria    Hemorrhoids    History of hiatal hernia 12/14/2017   small    Hypercholesteremia    Lesion of bladder    Myocardial infarct (HCC)    Presence of stent in coronary artery 11/09/2012   Rectal bleeding    Trigeminal neuralgia    Trigeminal neuralgia    Valvular heart  disease    Vitamin D deficiency    Past Surgical History:  Procedure Laterality Date   APPENDECTOMY     BOTOX INJECTION N/A 12/21/2017   Procedure: Bladder BOTOX INJECTION;  Surgeon: Vanna Scotland, MD;  Location: [facility] ORS;  Service: Urology;  Laterality: N/A;   BOTOX INJECTION N/A 09/11/2018   Procedure: Bladder BOTOX INJECTION;  Surgeon: Vanna Scotland, MD;  Location: [facility] ORS;  Service: Urology;  Laterality: N/A;   CARDIAC CATHETERIZATION     CARDIAC CATHETERIZATION N/A 01/22/2015   Procedure: Left Heart Cath;  Surgeon: Lamar Blinks, MD;  Location: [facility] INVASIVE CV LAB;  Service: Cardiovascular;  Laterality: N/A;   CARDIAC CATHETERIZATION N/A 01/22/2015   Procedure: Coronary Stent Intervention;  Surgeon: Marcina Millard, MD;  Location: [facility] INVASIVE CV LAB;  Service: Cardiovascular;  Laterality: N/A;   CATARACT EXTRACTION, BILATERAL     COLONOSCOPY WITH PROPOFOL N/A 12/08/2015   Procedure: COLONOSCOPY WITH PROPOFOL;  Surgeon: Christena Deem, MD;  Location: [facility] ENDOSCOPY;  Service: Endoscopy;  Laterality: N/A;   COLONOSCOPY WITH PROPOFOL N/A 12/09/2015   Procedure: COLONOSCOPY WITH PROPOFOL;  Surgeon: Christena Deem, MD;  Location: [facility]  ENDOSCOPY;  Service: Endoscopy;  Laterality: N/A;   CORONARY ANGIOPLASTY     5 stents   CORONARY STENT PLACEMENT  2015   x5   CYSTOSCOPY N/A 09/11/2018   Procedure: CYSTOSCOPY;  Surgeon: Vanna Scotland, MD;  Location: [facility] ORS;  Service: Urology;  Laterality: N/A;   CYSTOSCOPY WITH BIOPSY N/A 12/21/2017   Procedure: CYSTOSCOPY WITH BIOPSY;  Surgeon: Vanna Scotland, MD;  Location: [facility] ORS;  Service: Urology;  Laterality: N/A;   ESOPHAGOGASTRODUODENOSCOPY (EGD) WITH PROPOFOL N/A 12/08/2015   Procedure: ESOPHAGOGASTRODUODENOSCOPY (EGD) WITH PROPOFOL;  Surgeon: Christena Deem, MD;  Location: [facility] ENDOSCOPY;  Service: Endoscopy;  Laterality: N/A;   ESOPHAGOGASTRODUODENOSCOPY (EGD) WITH PROPOFOL N/A 12/13/2017   Procedure:  ESOPHAGOGASTRODUODENOSCOPY (EGD) WITH PROPOFOL;  Surgeon: Christena Deem, MD;  Location: [facility] ENDOSCOPY;  Service: Endoscopy;  Laterality: N/A;   EYE SURGERY     HERNIA REPAIR     kidney tumor remove     TRANSURETHRAL RESECTION OF BLADDER TUMOR WITH GYRUS (TURBT-GYRUS)  12/2013   UMBILICAL HERNIA REPAIR     urethral meatotomy     Patient Active Problem List   Diagnosis Date Noted   Class 2 obesity due to excess calories with body mass index (BMI) of 36.0 to 36.9 in adult 04/11/2020   Swelling of limb 03/28/2020   Lymphedema 03/28/2020   Trigeminal neuralgia 12/25/2019   Lower limb ulcer, calf, left, limited to breakdown of skin (HCC) 12/25/2019   OSA (obstructive sleep apnea) 01/23/2019   Acute systolic CHF (congestive heart failure) (HCC) 12/12/2018   Bruising 07/10/2018   Diet-controlled type 2 diabetes mellitus (HCC) 03/24/2018   Microalbuminuria 03/24/2018   Dizziness 11/17/2017   Simple chronic bronchitis (HCC) 09/22/2017   SOBOE (shortness of breath on exertion) 02/18/2017   Chronic midline low back pain without sciatica 12/29/2016   Periodic limb movement disorder 12/29/2016   Benign essential tremor 06/22/2016   Pure hypercholesterolemia 06/20/2015   Erectile dysfunction due to arterial insufficiency 06/16/2015   Unstable angina (HCC) 01/22/2015   Abdominal aortic 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 rupture (HCC) 12/31/2014   Benign prostatic hypertrophy without urinary obstruction 07/31/2014   Enlarged prostate 07/31/2014   Benign essential HTN 06/11/2014   Carotid artery narrowing 02/08/2014   Carotid artery obstruction 02/08/2014   Bilateral carotid artery stenosis 02/08/2014   Chest pain 08/20/2013   Bilateral cataracts 05/16/2013   Cataract 05/16/2013   Clinical depression 12/12/2012   Diabetes mellitus, type 2 (HCC) 12/12/2012   Combined fat and carbohydrate induced hyperlipemia 12/12/2012   Major depressive disorder, single episode, unspecified 12/12/2012   Acid  reflux 11/09/2012   Presence of stent in coronary artery 11/09/2012   BP (high blood pressure) 11/09/2012   Healed myocardial infarct 11/09/2012   Gastro-esophageal reflux disease without esophagitis 11/09/2012   History of cardiovascular surgery 11/09/2012   Fothergill's neuralgia 08/07/2012   Swelling of testicle 04/06/2012   Disorder of male genital organ 04/06/2012   Swelling of the testicles 04/06/2012   Arteriosclerosis of coronary artery 11/16/2011   CAD in native artery 11/16/2011   Fatigue 06/04/2011   Avitaminosis D 11/30/2010    ONSET DATE: 03/17/2022- Worsening symptoms  REFERRING DIAG:  G20.A1 (ICD-10-CM) - Parkinsons  R26.2 (ICD-10-CM) - Difficulty walking    THERAPY DIAG:  Difficulty in walking, not elsewhere classified  Muscle weakness (generalized)  Other lack of coordination  Abnormality of gait and mobility  Other abnormalities of gait and mobility  Unsteadiness on feet  Chronic bilateral low back pain without sciatica  Rationale  for Evaluation and Treatment: Rehabilitation  SUBJECTIVE:                                                                                                                                                                                             SUBJECTIVE STATEMENT: Patient reports doing okay this morning with no new complaints.    Pt accompanied by: significant other  PERTINENT HISTORY: Patient is a 77 year old male with referral to outpatient PT for Parkinsons and difficulty walking. He was recently seen by Neurology worsening symptoms of parkinson- difficulty with walking. He has past medical history of Arthritis, Bladder cancer, andTrigeminal Neuralgia.  PAIN:  Are you having pain? Yes: NPRS scale: 0/10 at rest but up to 8/10 at worst/10 Pain location: Right shoulder and right hip  Pain description: ache and sharp at times Aggravating factors: walking, up and down, overhead reaching with right arm Relieving  factors: Rest, pain meds as needed  PRECAUTIONS: Fall  WEIGHT BEARING RESTRICTIONS: No  FALLS: Has patient fallen in last 6 months? Yes. Number of falls 1  LIVING ENVIRONMENT: Lives with: lives with their spouse Lives in: House/apartment Stairs: Yes: External: 2 steps; has grab bar Has following equipment at home: Dan Humphreys - 2 wheeled, Environmental consultant - 4 wheeled, and Grab bars  PLOF: Independent with household mobility with device, Independent with community mobility with device, Independent with transfers, and Requires assistive device for independence  PATIENT GOALS: Get to where I can walk better again and get my legs stronger.   OBJECTIVE:   DIAGNOSTIC FINDINGS: none recent  COGNITION: Overall cognitive status: Within functional limits for tasks assessed   SENSATION: WFL  COORDINATION: Slow at times with initiation with UE and LE movement. Some freezing with turning/mobility.   EDEMA:  Wearing compression stockings   POSTURE: rounded shoulders, forward head, increased thoracic kyphosis, and flexed trunk   LOWER EXTREMITY ROM:     Active  Right Eval Left Eval  Hip flexion Limited <100 Limited < 100  Hip extension    Hip abduction    Hip adduction    Hip internal rotation    Hip external rotation    Knee flexion    Knee extension    Ankle dorsiflexion    Ankle plantarflexion    Ankle inversion    Ankle eversion     (Blank rows = not tested)  LOWER EXTREMITY MMT:    MMT Right Eval Left Eval  Hip flexion 3+ 3+  Hip extension 4 4  Hip abduction 4 4  Hip adduction 4 4  Hip internal rotation 4 4  Hip external rotation 4 4  Knee flexion 4 4  Knee extension 4 4-  Ankle dorsiflexion 4 4  Ankle plantarflexion 4 4  Ankle inversion 4 4  Ankle eversion 4 4  (Blank rows = not tested)    TRANSFERS: Assistive device utilized: Environmental consultant - 4 wheeled -Upright Sit to stand: CGA Stand to sit: CGA Chair to chair: CGA Floor:  not assessed   GAIT: Gait pattern:  step through pattern, decreased arm swing- Right, decreased arm swing- Left, decreased step length- Right, decreased step length- Left, decreased stance time- Right, decreased stance time- Left, decreased stride length, and shuffling Distance walked: 450 Assistive device utilized: Walker - 4 wheeled-upright Level of assistance: CGA Comments: Shuffling with some freezing with turning and into doorways- increased shuffling with distance.  FUNCTIONAL TESTS:  5 times sit to stand: 17.39 sec Timed up and go (TUG): 22.0 sec 6 minute walk test: 3:45 min and 450 feet total  10 meter walk test: 0.67 m/s Berg Balance Scale: To be assessed next visit  PATIENT SURVEYS:  FOTO 42  TODAY'S TREATMENT:                                                                                                                              DATE: 08/17/2022   Therex:   Nustep - Interval training - L1-5 x 5 min LE only with continuous monitoring for cardiovasular response   Seated thoracic rotation with head turns- left to right x 20 reps  Standing step up onto 6" step  with BUE overhead reach once on top of step.   Standing cross UE tap at shoulder height and above shoulder height with UE   Standing step tap with BUE- x 10 reps and without UE support x 20 reps     standing heel raises on 1/2 foam x 20 reps Standing  toe raises on 1/2 foam x 20 reps  Sit to stand with Overhead BUE reach x 15 reps (VC for technique to lean forward - much improved with corrected technique)    Ambulation in hallway with upright 4WW  with 3# for 300 feet - Concentrating on erect posture and foot clearance- Increased foot drag with distance)     PATIENT EDUCATION: Education details: Plan of care; purpose of functional outcome measures.  Person educated: Patient Education method: Explanation, Demonstration, Tactile cues, Verbal cues, and Handouts Education comprehension: verbalized understanding, returned demonstration, verbal  cues required, tactile cues required, and needs further education  HOME EXERCISE PROGRAM: Access Code: [email or web address] URL: [email or web address] Date: 08/11/2022 Prepared by: Maureen Ralphs  Exercises - Seated Lumbar Flexion Stretch  - 7 x weekly - 3 sets - 30 sec hold - Seated Thoracic Flexion and Rotation with Arms Crossed  - 1 x daily - 7 x weekly - 3 sets - 30 sec hold - Seated Figure 4 Piriformis Stretch  - 1 x daily - 7 x weekly - 3 sets - 10 reps  GOALS: Goals reviewed with patient?  Yes  SHORT TERM GOALS: Target date: 09/20/2022  Pt will be independent with a comprehensive HEP in order to improve strength and balance in order to decrease fall risk and improve function at home and work.  Baseline: EVAL: Patient admits not performing much exercises - not moving around well.  Goal status: INITIAL   LONG TERM GOALS: Target date: 11/01/2022  Pt will improve FOTO to target score of 45 % to display perceived improvements in ability to complete ADL's.  Baseline: EVAL: 42 Goal status: INITIAL  2.  Pt will decrease 5TSTS by at least 5 seconds in order to demonstrate clinically significant improvement in LE strength.  Baseline: EVAL = 17.39 sec without UE support Goal status: INITIAL  3. Pt will increase by at least 0.15 m/s in order to demonstrate clinically significant improvement in community ambulation   Baseline: EVAL= 0.67 m/s Goal status: INITIAL  4.  Pt will improve BERG by at least 3 points in order to demonstrate clinically significant improvement in balance.   Baseline: EVAL: to be assessed next visit Goal status: INITIAL  5.  Pt will decrease TUG to below 14 seconds/decrease in order to demonstrate decreased fall risk. Baseline: EVAL: 22.00 sec with upright 4WW Goal status: INITIAL  6.  Pt will increase by at least 68m (144ft) in order to demonstrate clinically significant improvement in cardiopulmonary endurance and community ambulation   Baseline: EVAL: 3:45 min (450 feet with upright 4WW) Goal status: INITIAL 7.   Patient will report returning to walking in to community places using most appropriate assistive device vs current use of scooter for improved abilities with mobility during community outings Baseline: EVAL: Current using scooter for many social/community outings  Goal status: INITIAL ASSESSMENT:  CLINICAL IMPRESSION: Treatment focused on continuing with plan of care- focusing on LE strengthening and coordination. He was challenged with all thoracic ROM activities- stating feeling stiff overall. He was able to improve with step length with consistent VC today and no significant difficulty with resistive walking.   PT will continue to benefit from skilled PT services to improve his overall strength and mobility to maximize his independent function while decreasing his fall risk.   OBJECTIVE IMPAIRMENTS: Abnormal gait, cardiopulmonary status limiting activity, decreased activity tolerance, decreased balance, decreased coordination, decreased endurance, decreased mobility, difficulty walking, decreased ROM, decreased strength, hypomobility, impaired flexibility, impaired sensation, impaired UE functional use, postural dysfunction, obesity, and pain.   ACTIVITY LIMITATIONS: carrying, lifting, bending, standing, squatting, stairs, transfers, continence, and reach over head  PARTICIPATION LIMITATIONS: meal prep, cleaning, laundry, driving, shopping, community activity, and yard work  PERSONAL FACTORS: Age and 3+ comorbidities: HTN, Arthritis, Low back pain, Trigeminal Neuralgia  are also affecting patient's functional outcome.   REHAB POTENTIAL: Good  CLINICAL DECISION MAKING: Evolving/moderate complexity  EVALUATION COMPLEXITY: Moderate  PLAN:  PT FREQUENCY: 1-2x/week  PT DURATION: 12 weeks  PLANNED INTERVENTIONS: Therapeutic exercises, Therapeutic activity, Neuromuscular re-education, Balance training, Gait  training, Patient/Family education, Self Care, Joint mobilization, Stair training, Vestibular training, Canalith repositioning, DME instructions, Dry Needling, Electrical stimulation, Spinal manipulation, Spinal mobilization, Cryotherapy, Moist heat, Manual therapy, and Re-evaluation  PLAN FOR NEXT SESSION: Assess BERG, Initiate therex, bed mobility, and balance training.    Lenda Kelp, PT 08/20/2022, 9:29 AM

## 2022-08-23 ENCOUNTER — Ambulatory Visit: Payer: Medicare HMO

## 2022-08-23 DIAGNOSIS — R269 Unspecified abnormalities of gait and mobility: Secondary | ICD-10-CM

## 2022-08-23 DIAGNOSIS — R2689 Other abnormalities of gait and mobility: Secondary | ICD-10-CM

## 2022-08-23 DIAGNOSIS — R262 Difficulty in walking, not elsewhere classified: Secondary | ICD-10-CM

## 2022-08-23 DIAGNOSIS — M6281 Muscle weakness (generalized): Secondary | ICD-10-CM

## 2022-08-23 DIAGNOSIS — R2681 Unsteadiness on feet: Secondary | ICD-10-CM

## 2022-08-23 DIAGNOSIS — R278 Other lack of coordination: Secondary | ICD-10-CM

## 2022-08-23 NOTE — Therapy (Signed)
OUTPATIENT PHYSICAL THERAPY NEURO TREATMENT   Patient Name: [name] MRN: [id number] DOB:June 04, 1945, 77 y.o., male Today's Date: 08/23/2022   PCP: Ardyth Man, PA- C REFERRING PROVIDER: Morene Crocker, MD  END OF SESSION:  PT End of Session - 08/23/22 0852     Visit Number 5    Number of Visits 24    Date for PT Re-Evaluation 11/01/22    Progress Note Due on Visit 10    PT Start Time 0845    PT Stop Time 0931    PT Time Calculation (min) 46 min    Equipment Utilized During Treatment Gait belt    Activity Tolerance Patient tolerated treatment well    Behavior During Therapy [facility] for tasks assessed/performed              Past Medical History:  Diagnosis Date   Anxiety    Arteriosclerosis of coronary artery 11/16/2011   Overview:  Stent 10/2011 stent rca 2015 with collaterals to lad which is chronically occluded    Benign enlargement of prostate    Benign essential HTN 06/11/2014   Benign prostatic hypertrophy without urinary obstruction 07/31/2014   Bilateral cataracts 05/16/2013   Overview:  [facility]     Bone spur of foot    Left   BP (high blood pressure) 11/09/2012   Cancer (HCC)    skin (forehead) and bladder   Carotid artery narrowing 02/08/2014   Depression    Detrusor hypertrophy    Diabetes (HCC)    Diabetes mellitus, type 2 (HCC) 12/12/2012   Diverticulosis    Dyspnea    Esophageal reflux    Esophageal reflux    Fothergill's neuralgia 08/07/2012   Overview:  [facility]    Gastritis    GERD (gastroesophageal reflux disease)    Headache    cluster headaches   Healed myocardial infarct 11/09/2012   Hearing loss in left ear    Heart disease    Hematuria    Hemorrhoids    History of hiatal hernia 12/14/2017   small    Hypercholesteremia    Lesion of bladder    Myocardial infarct (HCC)    Presence of stent in coronary artery 11/09/2012   Rectal bleeding    Trigeminal neuralgia    Trigeminal neuralgia    Valvular heart  disease    Vitamin D deficiency    Past Surgical History:  Procedure Laterality Date   APPENDECTOMY     BOTOX INJECTION N/A 12/21/2017   Procedure: Bladder BOTOX INJECTION;  Surgeon: Vanna Scotland, MD;  Location: [facility] ORS;  Service: Urology;  Laterality: N/A;   BOTOX INJECTION N/A 09/11/2018   Procedure: Bladder BOTOX INJECTION;  Surgeon: Vanna Scotland, MD;  Location: [facility] ORS;  Service: Urology;  Laterality: N/A;   CARDIAC CATHETERIZATION     CARDIAC CATHETERIZATION N/A 01/22/2015   Procedure: Left Heart Cath;  Surgeon: Lamar Blinks, MD;  Location: [facility] INVASIVE CV LAB;  Service: Cardiovascular;  Laterality: N/A;   CARDIAC CATHETERIZATION N/A 01/22/2015   Procedure: Coronary Stent Intervention;  Surgeon: Marcina Millard, MD;  Location: [facility] INVASIVE CV LAB;  Service: Cardiovascular;  Laterality: N/A;   CATARACT EXTRACTION, BILATERAL     COLONOSCOPY WITH PROPOFOL N/A 12/08/2015   Procedure: COLONOSCOPY WITH PROPOFOL;  Surgeon: Christena Deem, MD;  Location: [facility] ENDOSCOPY;  Service: Endoscopy;  Laterality: N/A;   COLONOSCOPY WITH PROPOFOL N/A 12/09/2015   Procedure: COLONOSCOPY WITH PROPOFOL;  Surgeon: Christena Deem, MD;  Location: [facility]  ENDOSCOPY;  Service: Endoscopy;  Laterality: N/A;   CORONARY ANGIOPLASTY     5 stents   CORONARY STENT PLACEMENT  2015   x5   CYSTOSCOPY N/A 09/11/2018   Procedure: CYSTOSCOPY;  Surgeon: Vanna Scotland, MD;  Location: [facility] ORS;  Service: Urology;  Laterality: N/A;   CYSTOSCOPY WITH BIOPSY N/A 12/21/2017   Procedure: CYSTOSCOPY WITH BIOPSY;  Surgeon: Vanna Scotland, MD;  Location: [facility] ORS;  Service: Urology;  Laterality: N/A;   ESOPHAGOGASTRODUODENOSCOPY (EGD) WITH PROPOFOL N/A 12/08/2015   Procedure: ESOPHAGOGASTRODUODENOSCOPY (EGD) WITH PROPOFOL;  Surgeon: Christena Deem, MD;  Location: [facility] ENDOSCOPY;  Service: Endoscopy;  Laterality: N/A;   ESOPHAGOGASTRODUODENOSCOPY (EGD) WITH PROPOFOL N/A 12/13/2017   Procedure:  ESOPHAGOGASTRODUODENOSCOPY (EGD) WITH PROPOFOL;  Surgeon: Christena Deem, MD;  Location: [facility] ENDOSCOPY;  Service: Endoscopy;  Laterality: N/A;   EYE SURGERY     HERNIA REPAIR     kidney tumor remove     TRANSURETHRAL RESECTION OF BLADDER TUMOR WITH GYRUS (TURBT-GYRUS)  12/2013   UMBILICAL HERNIA REPAIR     urethral meatotomy     Patient Active Problem List   Diagnosis Date Noted   Class 2 obesity due to excess calories with body mass index (BMI) of 36.0 to 36.9 in adult 04/11/2020   Swelling of limb 03/28/2020   Lymphedema 03/28/2020   Trigeminal neuralgia 12/25/2019   Lower limb ulcer, calf, left, limited to breakdown of skin (HCC) 12/25/2019   OSA (obstructive sleep apnea) 01/23/2019   Acute systolic CHF (congestive heart failure) (HCC) 12/12/2018   Bruising 07/10/2018   Diet-controlled type 2 diabetes mellitus (HCC) 03/24/2018   Microalbuminuria 03/24/2018   Dizziness 11/17/2017   Simple chronic bronchitis (HCC) 09/22/2017   SOBOE (shortness of breath on exertion) 02/18/2017   Chronic midline low back pain without sciatica 12/29/2016   Periodic limb movement disorder 12/29/2016   Benign essential tremor 06/22/2016   Pure hypercholesterolemia 06/20/2015   Erectile dysfunction due to arterial insufficiency 06/16/2015   Unstable angina (HCC) 01/22/2015   Abdominal aortic 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 rupture (HCC) 12/31/2014   Benign prostatic hypertrophy without urinary obstruction 07/31/2014   Enlarged prostate 07/31/2014   Benign essential HTN 06/11/2014   Carotid artery narrowing 02/08/2014   Carotid artery obstruction 02/08/2014   Bilateral carotid artery stenosis 02/08/2014   Chest pain 08/20/2013   Bilateral cataracts 05/16/2013   Cataract 05/16/2013   Clinical depression 12/12/2012   Diabetes mellitus, type 2 (HCC) 12/12/2012   Combined fat and carbohydrate induced hyperlipemia 12/12/2012   Major depressive disorder, single episode, unspecified 12/12/2012   Acid  reflux 11/09/2012   Presence of stent in coronary artery 11/09/2012   BP (high blood pressure) 11/09/2012   Healed myocardial infarct 11/09/2012   Gastro-esophageal reflux disease without esophagitis 11/09/2012   History of cardiovascular surgery 11/09/2012   Fothergill's neuralgia 08/07/2012   Swelling of testicle 04/06/2012   Disorder of male genital organ 04/06/2012   Swelling of the testicles 04/06/2012   Arteriosclerosis of coronary artery 11/16/2011   CAD in native artery 11/16/2011   Fatigue 06/04/2011   Avitaminosis D 11/30/2010    ONSET DATE: 03/17/2022- Worsening symptoms  REFERRING DIAG:  G20.A1 (ICD-10-CM) - Parkinsons  R26.2 (ICD-10-CM) - Difficulty walking    THERAPY DIAG:  Difficulty in walking, not elsewhere classified  Muscle weakness (generalized)  Other lack of coordination  Abnormality of gait and mobility  Other abnormalities of gait and mobility  Unsteadiness on feet  Rationale for Evaluation and Treatment: Rehabilitation  SUBJECTIVE:  SUBJECTIVE STATEMENT: Patient reports doing okay this morning with no new complaints.    Pt accompanied by: significant other  PERTINENT HISTORY: Patient is a 77 year old male with referral to outpatient PT for Parkinsons and difficulty walking. He was recently seen by Neurology worsening symptoms of parkinson- difficulty with walking. He has past medical history of Arthritis, Bladder cancer, andTrigeminal Neuralgia.  PAIN:  Are you having pain? Yes: NPRS scale: 0/10 at rest but up to 8/10 at worst/10 Pain location: Right shoulder and right hip  Pain description: ache and sharp at times Aggravating factors: walking, up and down, overhead reaching with right arm Relieving factors: Rest, pain meds as needed  PRECAUTIONS:  Fall  WEIGHT BEARING RESTRICTIONS: No  FALLS: Has patient fallen in last 6 months? Yes. Number of falls 1  LIVING ENVIRONMENT: Lives with: lives with their spouse Lives in: House/apartment Stairs: Yes: External: 2 steps; has grab bar Has following equipment at home: Dan Humphreys - 2 wheeled, Environmental consultant - 4 wheeled, and Grab bars  PLOF: Independent with household mobility with device, Independent with community mobility with device, Independent with transfers, and Requires assistive device for independence  PATIENT GOALS: Get to where I can walk better again and get my legs stronger.   OBJECTIVE:   DIAGNOSTIC FINDINGS: none recent  COGNITION: Overall cognitive status: Within functional limits for tasks assessed   SENSATION: WFL  COORDINATION: Slow at times with initiation with UE and LE movement. Some freezing with turning/mobility.   EDEMA:  Wearing compression stockings   POSTURE: rounded shoulders, forward head, increased thoracic kyphosis, and flexed trunk   LOWER EXTREMITY ROM:     Active  Right Eval Left Eval  Hip flexion Limited <100 Limited < 100  Hip extension    Hip abduction    Hip adduction    Hip internal rotation    Hip external rotation    Knee flexion    Knee extension    Ankle dorsiflexion    Ankle plantarflexion    Ankle inversion    Ankle eversion     (Blank rows = not tested)  LOWER EXTREMITY MMT:    MMT Right Eval Left Eval  Hip flexion 3+ 3+  Hip extension 4 4  Hip abduction 4 4  Hip adduction 4 4  Hip internal rotation 4 4  Hip external rotation 4 4  Knee flexion 4 4  Knee extension 4 4-  Ankle dorsiflexion 4 4  Ankle plantarflexion 4 4  Ankle inversion 4 4  Ankle eversion 4 4  (Blank rows = not tested)    TRANSFERS: Assistive device utilized: Environmental consultant - 4 wheeled -Upright Sit to stand: CGA Stand to sit: CGA Chair to chair: CGA Floor:  not assessed   GAIT: Gait pattern: step through pattern, decreased arm swing- Right,  decreased arm swing- Left, decreased step length- Right, decreased step length- Left, decreased stance time- Right, decreased stance time- Left, decreased stride length, and shuffling Distance walked: 450 Assistive device utilized: Walker - 4 wheeled-upright Level of assistance: CGA Comments: Shuffling with some freezing with turning and into doorways- increased shuffling with distance.  FUNCTIONAL TESTS:  5 times sit to stand: 17.39 sec Timed up and go (TUG): 22.0 sec 6 minute walk test: 3:45 min and 450 feet total  10 meter walk test: 0.67 m/s Berg Balance Scale: To be assessed next visit  PATIENT SURVEYS:  FOTO 42  TODAY'S TREATMENT:  DATE: 08/23/2022   Therex:   Nustep - Interval training - L1-5 x 5 min LE only with continuous monitoring for cardiovasular response. HR= 82 bpm. O2 sat=95%   Sit to stand with Overhead BUE reach x 15 reps (VC for technique to lean forward - much improved with corrected technique)   Side step with dynamic reach with UE x 15 reps each side  Standing step tap with BUE- x 10 reps and without UE support x 20 reps     NMR:   Assessed BERG: 36/56  Retro Gait in // bars: counting steps   PATIENT EDUCATION: Education details: Plan of care; purpose of functional outcome measures.  Person educated: Patient Education method: Explanation, Demonstration, Tactile cues, Verbal cues, and Handouts Education comprehension: verbalized understanding, returned demonstration, verbal cues required, tactile cues required, and needs further education  HOME EXERCISE PROGRAM: Access Code: [email or web address] URL: [email or web address] Date: 08/11/2022 Prepared by: Maureen Ralphs  Exercises - Seated Lumbar Flexion Stretch  - 7 x weekly - 3 sets - 30 sec hold - Seated Thoracic Flexion and Rotation with Arms Crossed  - 1 x daily - 7  x weekly - 3 sets - 30 sec hold - Seated Figure 4 Piriformis Stretch  - 1 x daily - 7 x weekly - 3 sets - 10 reps  GOALS: Goals reviewed with patient? Yes  SHORT TERM GOALS: Target date: 09/20/2022  Pt will be independent with a comprehensive HEP in order to improve strength and balance in order to decrease fall risk and improve function at home and work.  Baseline: EVAL: Patient admits not performing much exercises - not moving around well.  Goal status: INITIAL   LONG TERM GOALS: Target date: 11/01/2022  Pt will improve FOTO to target score of 45 % to display perceived improvements in ability to complete ADL's.  Baseline: EVAL: 42 Goal status: INITIAL  2.  Pt will decrease 5TSTS by at least 5 seconds in order to demonstrate clinically significant improvement in LE strength.  Baseline: EVAL = 17.39 sec without UE support Goal status: INITIAL  3. Pt will increase by at least 0.15 m/s in order to demonstrate clinically significant improvement in community ambulation   Baseline: EVAL= 0.67 m/s Goal status: INITIAL  4.  Pt will improve BERG by at least 3 points in order to demonstrate clinically significant improvement in balance.   Baseline: EVAL: to be assessed next visit: 08/23/2022= 36/56 Goal status: INITIAL  5.  Pt will decrease TUG to below 14 seconds/decrease in order to demonstrate decreased fall risk. Baseline: EVAL: 22.00 sec with upright 4WW Goal status: INITIAL  6.  Pt will increase by at least 63m (130ft) in order to demonstrate clinically significant improvement in cardiopulmonary endurance and community ambulation  Baseline: EVAL: 3:45 min (450 feet with upright 4WW) Goal status: INITIAL 7.   Patient will report returning to walking in to community places using most appropriate assistive device vs current use of scooter for improved abilities with mobility during community outings Baseline: EVAL: Current using scooter for many social/community outings  Goal  status: INITIAL ASSESSMENT:  CLINICAL IMPRESSION: Patient presented with good motivation for today's session. Slightly more stiff and moving slower today requiring more UE support and more VC for increased step length. His BERG score indicates risk of falling and treatment will continue to focus on LE strengthening and static/dynamic balance training   PT will continue to benefit from skilled PT services to improve his overall strength and mobility  to maximize his independent function while decreasing his fall risk.   OBJECTIVE IMPAIRMENTS: Abnormal gait, cardiopulmonary status limiting activity, decreased activity tolerance, decreased balance, decreased coordination, decreased endurance, decreased mobility, difficulty walking, decreased ROM, decreased strength, hypomobility, impaired flexibility, impaired sensation, impaired UE functional use, postural dysfunction, obesity, and pain.   ACTIVITY LIMITATIONS: carrying, lifting, bending, standing, squatting, stairs, transfers, continence, and reach over head  PARTICIPATION LIMITATIONS: meal prep, cleaning, laundry, driving, shopping, community activity, and yard work  PERSONAL FACTORS: Age and 3+ comorbidities: HTN, Arthritis, Low back pain, Trigeminal Neuralgia  are also affecting patient's functional outcome.   REHAB POTENTIAL: Good  CLINICAL DECISION MAKING: Evolving/moderate complexity  EVALUATION COMPLEXITY: Moderate  PLAN:  PT FREQUENCY: 1-2x/week  PT DURATION: 12 weeks  PLANNED INTERVENTIONS: Therapeutic exercises, Therapeutic activity, Neuromuscular re-education, Balance training, Gait training, Patient/Family education, Self Care, Joint mobilization, Stair training, Vestibular training, Canalith repositioning, DME instructions, Dry Needling, Electrical stimulation, Spinal manipulation, Spinal mobilization, Cryotherapy, Moist heat, Manual therapy, and Re-evaluation  PLAN FOR NEXT SESSION: Progress therex, bed mobility, and balance  training.    Lenda Kelp, PT 08/23/2022, 9:52 AM

## 2022-08-25 ENCOUNTER — Encounter: Payer: Self-pay | Admitting: Podiatry

## 2022-08-25 ENCOUNTER — Ambulatory Visit: Payer: Medicare HMO | Admitting: Podiatry

## 2022-08-25 DIAGNOSIS — D2372 Other benign neoplasm of skin of left lower limb, including hip: Secondary | ICD-10-CM

## 2022-08-25 DIAGNOSIS — B351 Tinea unguium: Secondary | ICD-10-CM

## 2022-08-25 DIAGNOSIS — M79676 Pain in unspecified toe(s): Secondary | ICD-10-CM | POA: Diagnosis not present

## 2022-08-25 DIAGNOSIS — E1142 Type 2 diabetes mellitus with diabetic polyneuropathy: Secondary | ICD-10-CM

## 2022-08-25 DIAGNOSIS — D689 Coagulation defect, unspecified: Secondary | ICD-10-CM | POA: Diagnosis not present

## 2022-08-25 NOTE — Progress Notes (Signed)
He presents today with his wife for chief complaint of painful toenails and calluses bilaterally.  Objective: Vital signs are stable he is alert and oriented x 3 there is no erythema edema cellulitis drainage or odor.  Toenails are long thick yellow dystrophic clinically mycotic.  Multiple calluses particular along the margin of the heel no open lesions or wounds.  Assessment: Pain limb secondary to onychomycosis and benign skin lesions.  Plan: Debridement of benign skin lesion debridement of mycotic nails.

## 2022-08-26 ENCOUNTER — Ambulatory Visit: Payer: Medicare HMO

## 2022-08-26 DIAGNOSIS — M6281 Muscle weakness (generalized): Secondary | ICD-10-CM

## 2022-08-26 DIAGNOSIS — R2681 Unsteadiness on feet: Secondary | ICD-10-CM

## 2022-08-26 DIAGNOSIS — R2689 Other abnormalities of gait and mobility: Secondary | ICD-10-CM

## 2022-08-26 DIAGNOSIS — G8929 Other chronic pain: Secondary | ICD-10-CM

## 2022-08-26 DIAGNOSIS — R262 Difficulty in walking, not elsewhere classified: Secondary | ICD-10-CM | POA: Diagnosis not present

## 2022-08-26 DIAGNOSIS — R269 Unspecified abnormalities of gait and mobility: Secondary | ICD-10-CM

## 2022-08-26 DIAGNOSIS — R278 Other lack of coordination: Secondary | ICD-10-CM

## 2022-08-26 NOTE — Therapy (Signed)
OUTPATIENT PHYSICAL THERAPY NEURO TREATMENT   Patient Name: [name] MRN: [id number] DOB:March 14, 1945, 77 y.o., male Today's Date: 08/26/2022   PCP: Ardyth Man, PA- C REFERRING PROVIDER: Morene Crocker, MD  END OF SESSION:  PT End of Session - 08/26/22 0859     Visit Number 6    Number of Visits 24    Date for PT Re-Evaluation 11/01/22    Progress Note Due on Visit 10    PT Start Time 0847    PT Stop Time 0929    PT Time Calculation (min) 42 min    Equipment Utilized During Treatment Gait belt    Activity Tolerance Patient tolerated treatment well    Behavior During Therapy [facility] for tasks assessed/performed              Past Medical History:  Diagnosis Date   Anxiety    Arteriosclerosis of coronary artery 11/16/2011   Overview:  Stent 10/2011 stent rca 2015 with collaterals to lad which is chronically occluded    Benign enlargement of prostate    Benign essential HTN 06/11/2014   Benign prostatic hypertrophy without urinary obstruction 07/31/2014   Bilateral cataracts 05/16/2013   Overview:  [facility]     Bone spur of foot    Left   BP (high blood pressure) 11/09/2012   Cancer (HCC)    skin (forehead) and bladder   Carotid artery narrowing 02/08/2014   Depression    Detrusor hypertrophy    Diabetes (HCC)    Diabetes mellitus, type 2 (HCC) 12/12/2012   Diverticulosis    Dyspnea    Esophageal reflux    Esophageal reflux    Fothergill's neuralgia 08/07/2012   Overview:  [facility]    Gastritis    GERD (gastroesophageal reflux disease)    Headache    cluster headaches   Healed myocardial infarct 11/09/2012   Hearing loss in left ear    Heart disease    Hematuria    Hemorrhoids    History of hiatal hernia 12/14/2017   small    Hypercholesteremia    Lesion of bladder    Myocardial infarct (HCC)    Presence of stent in coronary artery 11/09/2012   Rectal bleeding    Trigeminal neuralgia    Trigeminal neuralgia    Valvular heart  disease    Vitamin D deficiency    Past Surgical History:  Procedure Laterality Date   APPENDECTOMY     BOTOX INJECTION N/A 12/21/2017   Procedure: Bladder BOTOX INJECTION;  Surgeon: Vanna Scotland, MD;  Location: [facility] ORS;  Service: Urology;  Laterality: N/A;   BOTOX INJECTION N/A 09/11/2018   Procedure: Bladder BOTOX INJECTION;  Surgeon: Vanna Scotland, MD;  Location: [facility] ORS;  Service: Urology;  Laterality: N/A;   CARDIAC CATHETERIZATION     CARDIAC CATHETERIZATION N/A 01/22/2015   Procedure: Left Heart Cath;  Surgeon: Lamar Blinks, MD;  Location: [facility] INVASIVE CV LAB;  Service: Cardiovascular;  Laterality: N/A;   CARDIAC CATHETERIZATION N/A 01/22/2015   Procedure: Coronary Stent Intervention;  Surgeon: Marcina Millard, MD;  Location: [facility] INVASIVE CV LAB;  Service: Cardiovascular;  Laterality: N/A;   CATARACT EXTRACTION, BILATERAL     COLONOSCOPY WITH PROPOFOL N/A 12/08/2015   Procedure: COLONOSCOPY WITH PROPOFOL;  Surgeon: Christena Deem, MD;  Location: [facility] ENDOSCOPY;  Service: Endoscopy;  Laterality: N/A;   COLONOSCOPY WITH PROPOFOL N/A 12/09/2015   Procedure: COLONOSCOPY WITH PROPOFOL;  Surgeon: Christena Deem, MD;  Location: [facility]  ENDOSCOPY;  Service: Endoscopy;  Laterality: N/A;   CORONARY ANGIOPLASTY     5 stents   CORONARY STENT PLACEMENT  2015   x5   CYSTOSCOPY N/A 09/11/2018   Procedure: CYSTOSCOPY;  Surgeon: Vanna Scotland, MD;  Location: [facility] ORS;  Service: Urology;  Laterality: N/A;   CYSTOSCOPY WITH BIOPSY N/A 12/21/2017   Procedure: CYSTOSCOPY WITH BIOPSY;  Surgeon: Vanna Scotland, MD;  Location: [facility] ORS;  Service: Urology;  Laterality: N/A;   ESOPHAGOGASTRODUODENOSCOPY (EGD) WITH PROPOFOL N/A 12/08/2015   Procedure: ESOPHAGOGASTRODUODENOSCOPY (EGD) WITH PROPOFOL;  Surgeon: Christena Deem, MD;  Location: [facility] ENDOSCOPY;  Service: Endoscopy;  Laterality: N/A;   ESOPHAGOGASTRODUODENOSCOPY (EGD) WITH PROPOFOL N/A 12/13/2017   Procedure:  ESOPHAGOGASTRODUODENOSCOPY (EGD) WITH PROPOFOL;  Surgeon: Christena Deem, MD;  Location: [facility] ENDOSCOPY;  Service: Endoscopy;  Laterality: N/A;   EYE SURGERY     HERNIA REPAIR     kidney tumor remove     TRANSURETHRAL RESECTION OF BLADDER TUMOR WITH GYRUS (TURBT-GYRUS)  12/2013   UMBILICAL HERNIA REPAIR     urethral meatotomy     Patient Active Problem List   Diagnosis Date Noted   Class 2 obesity due to excess calories with body mass index (BMI) of 36.0 to 36.9 in adult 04/11/2020   Swelling of limb 03/28/2020   Lymphedema 03/28/2020   Trigeminal neuralgia 12/25/2019   Lower limb ulcer, calf, left, limited to breakdown of skin (HCC) 12/25/2019   OSA (obstructive sleep apnea) 01/23/2019   Acute systolic CHF (congestive heart failure) (HCC) 12/12/2018   Bruising 07/10/2018   Diet-controlled type 2 diabetes mellitus (HCC) 03/24/2018   Microalbuminuria 03/24/2018   Dizziness 11/17/2017   Simple chronic bronchitis (HCC) 09/22/2017   SOBOE (shortness of breath on exertion) 02/18/2017   Chronic midline low back pain without sciatica 12/29/2016   Periodic limb movement disorder 12/29/2016   Benign essential tremor 06/22/2016   Pure hypercholesterolemia 06/20/2015   Erectile dysfunction due to arterial insufficiency 06/16/2015   Unstable angina (HCC) 01/22/2015   Abdominal aortic 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 rupture (HCC) 12/31/2014   Benign prostatic hypertrophy without urinary obstruction 07/31/2014   Enlarged prostate 07/31/2014   Benign essential HTN 06/11/2014   Carotid artery narrowing 02/08/2014   Carotid artery obstruction 02/08/2014   Bilateral carotid artery stenosis 02/08/2014   Chest pain 08/20/2013   Bilateral cataracts 05/16/2013   Cataract 05/16/2013   Clinical depression 12/12/2012   Diabetes mellitus, type 2 (HCC) 12/12/2012   Combined fat and carbohydrate induced hyperlipemia 12/12/2012   Major depressive disorder, single episode, unspecified 12/12/2012   Acid  reflux 11/09/2012   Presence of stent in coronary artery 11/09/2012   BP (high blood pressure) 11/09/2012   Healed myocardial infarct 11/09/2012   Gastro-esophageal reflux disease without esophagitis 11/09/2012   History of cardiovascular surgery 11/09/2012   Fothergill's neuralgia 08/07/2012   Swelling of testicle 04/06/2012   Disorder of male genital organ 04/06/2012   Swelling of the testicles 04/06/2012   Arteriosclerosis of coronary artery 11/16/2011   CAD in native artery 11/16/2011   Fatigue 06/04/2011   Avitaminosis D 11/30/2010    ONSET DATE: 03/17/2022- Worsening symptoms  REFERRING DIAG:  G20.A1 (ICD-10-CM) - Parkinsons  R26.2 (ICD-10-CM) - Difficulty walking    THERAPY DIAG:  Difficulty in walking, not elsewhere classified  Muscle weakness (generalized)  Other lack of coordination  Abnormality of gait and mobility  Other abnormalities of gait and mobility  Unsteadiness on feet  Chronic bilateral low back pain without sciatica  Rationale  for Evaluation and Treatment: Rehabilitation  SUBJECTIVE:                                                                                                                                                                                             SUBJECTIVE STATEMENT: Patient reports doing okay this morning with no new complaints.    Pt accompanied by: significant other  PERTINENT HISTORY: Patient is a 77 year old male with referral to outpatient PT for Parkinsons and difficulty walking. He was recently seen by Neurology worsening symptoms of parkinson- difficulty with walking. He has past medical history of Arthritis, Bladder cancer, andTrigeminal Neuralgia.  PAIN:  Are you having pain? Yes: NPRS scale: 0/10 at rest but up to 8/10 at worst/10 Pain location: Right shoulder and right hip  Pain description: ache and sharp at times Aggravating factors: walking, up and down, overhead reaching with right arm Relieving  factors: Rest, pain meds as needed  PRECAUTIONS: Fall  WEIGHT BEARING RESTRICTIONS: No  FALLS: Has patient fallen in last 6 months? Yes. Number of falls 1  LIVING ENVIRONMENT: Lives with: lives with their spouse Lives in: House/apartment Stairs: Yes: External: 2 steps; has grab bar Has following equipment at home: Dan Humphreys - 2 wheeled, Environmental consultant - 4 wheeled, and Grab bars  PLOF: Independent with household mobility with device, Independent with community mobility with device, Independent with transfers, and Requires assistive device for independence  PATIENT GOALS: Get to where I can walk better again and get my legs stronger.   OBJECTIVE:   DIAGNOSTIC FINDINGS: none recent  COGNITION: Overall cognitive status: Within functional limits for tasks assessed   SENSATION: WFL  COORDINATION: Slow at times with initiation with UE and LE movement. Some freezing with turning/mobility.   EDEMA:  Wearing compression stockings   POSTURE: rounded shoulders, forward head, increased thoracic kyphosis, and flexed trunk   LOWER EXTREMITY ROM:     Active  Right Eval Left Eval  Hip flexion Limited <100 Limited < 100  Hip extension    Hip abduction    Hip adduction    Hip internal rotation    Hip external rotation    Knee flexion    Knee extension    Ankle dorsiflexion    Ankle plantarflexion    Ankle inversion    Ankle eversion     (Blank rows = not tested)  LOWER EXTREMITY MMT:    MMT Right Eval Left Eval  Hip flexion 3+ 3+  Hip extension 4 4  Hip abduction 4 4  Hip adduction 4 4  Hip internal rotation 4 4  Hip external rotation 4 4  Knee flexion 4 4  Knee extension 4 4-  Ankle dorsiflexion 4 4  Ankle plantarflexion 4 4  Ankle inversion 4 4  Ankle eversion 4 4  (Blank rows = not tested)    TRANSFERS: Assistive device utilized: Environmental consultant - 4 wheeled -Upright Sit to stand: CGA Stand to sit: CGA Chair to chair: CGA Floor:  not assessed   GAIT: Gait pattern:  step through pattern, decreased arm swing- Right, decreased arm swing- Left, decreased step length- Right, decreased step length- Left, decreased stance time- Right, decreased stance time- Left, decreased stride length, and shuffling Distance walked: 450 Assistive device utilized: Walker - 4 wheeled-upright Level of assistance: CGA Comments: Shuffling with some freezing with turning and into doorways- increased shuffling with distance.  FUNCTIONAL TESTS:  5 times sit to stand: 17.39 sec Timed up and go (TUG): 22.0 sec 6 minute walk test: 3:45 min and 450 feet total  10 meter walk test: 0.67 m/s Berg Balance Scale: To be assessed next visit  PATIENT SURVEYS:  FOTO 42  TODAY'S TREATMENT:                                                                                                                              DATE: 08/26/2022   Therex:   Seated hip flex 3# AW 2 sets of 10 reps  Seated knee ext 3# AW 2 sets of 10 reps  Standing calf raises 3# AW- 2 sets of 10 reps   Sit to stand with Overhead BUE reach x 15 reps (VC for technique to lean forward - much improved with corrected technique)   Side step to left and right with dynamic overhead reach with UE x 15 reps each side (within pain limit on right side)   Standing step tap with BUE- x 10 reps with 3#AW  Dynamic walking using 4WW with 3# AW- 220 feet - no shuffling until he walked through a doorway threshold or coming back to   Side step up/over 1/2 foam 3# AW x 15 reps each direction (attempted without UE support yet patient unable due to LOB)    PATIENT EDUCATION: Education details: Plan of care; purpose of functional outcome measures.  Person educated: Patient Education method: Explanation, Demonstration, Tactile cues, Verbal cues, and Handouts Education comprehension: verbalized understanding, returned demonstration, verbal cues required, tactile cues required, and needs further education  HOME EXERCISE PROGRAM: Access  Code: [email or web address] URL: [email or web address] Date: 08/11/2022 Prepared by: Maureen Ralphs  Exercises - Seated Lumbar Flexion Stretch  - 7 x weekly - 3 sets - 30 sec hold - Seated Thoracic Flexion and Rotation with Arms Crossed  - 1 x daily - 7 x weekly - 3 sets - 30 sec hold - Seated Figure 4 Piriformis Stretch  - 1 x daily - 7 x weekly - 3 sets - 10 reps  GOALS: Goals reviewed with patient? Yes  SHORT TERM GOALS: Target date: 09/20/2022  Pt will be independent with  a comprehensive HEP in order to improve strength and balance in order to decrease fall risk and improve function at home and work.  Baseline: EVAL: Patient admits not performing much exercises - not moving around well.  Goal status: INITIAL   LONG TERM GOALS: Target date: 11/01/2022  Pt will improve FOTO to target score of 45 % to display perceived improvements in ability to complete ADL's.  Baseline: EVAL: 42 Goal status: INITIAL  2.  Pt will decrease 5TSTS by at least 5 seconds in order to demonstrate clinically significant improvement in LE strength.  Baseline: EVAL = 17.39 sec without UE support Goal status: INITIAL  3. Pt will increase by at least 0.15 m/s in order to demonstrate clinically significant improvement in community ambulation   Baseline: EVAL= 0.67 m/s Goal status: INITIAL  4.  Pt will improve BERG by at least 3 points in order to demonstrate clinically significant improvement in balance.   Baseline: EVAL: to be assessed next visit: 08/23/2022= 36/56 Goal status: INITIAL  5.  Pt will decrease TUG to below 14 seconds/decrease in order to demonstrate decreased fall risk. Baseline: EVAL: 22.00 sec with upright 4WW Goal status: INITIAL  6.  Pt will increase by at least 31m (19ft) in order to demonstrate clinically significant improvement in cardiopulmonary endurance and community ambulation  Baseline: EVAL: 3:45 min (450 feet with upright 4WW) Goal status: INITIAL 7.    Patient will report returning to walking in to community places using most appropriate assistive device vs current use of scooter for improved abilities with mobility during community outings Baseline: EVAL: Current using scooter for many social/community outings  Goal status: INITIAL ASSESSMENT:  CLINICAL IMPRESSION: Patient endorsed increased fatigue overall today yet responded well to all therex today. He was able to improve with step length with resistive walking with minimal shuffling today and able to dynamically reach with UE without LOB or report of increased right shoulder pain.   PT will continue to benefit from skilled PT services to improve his overall strength and mobility to maximize his independent function while decreasing his fall risk.   OBJECTIVE IMPAIRMENTS: Abnormal gait, cardiopulmonary status limiting activity, decreased activity tolerance, decreased balance, decreased coordination, decreased endurance, decreased mobility, difficulty walking, decreased ROM, decreased strength, hypomobility, impaired flexibility, impaired sensation, impaired UE functional use, postural dysfunction, obesity, and pain.   ACTIVITY LIMITATIONS: carrying, lifting, bending, standing, squatting, stairs, transfers, continence, and reach over head  PARTICIPATION LIMITATIONS: meal prep, cleaning, laundry, driving, shopping, community activity, and yard work  PERSONAL FACTORS: Age and 3+ comorbidities: HTN, Arthritis, Low back pain, Trigeminal Neuralgia  are also affecting patient's functional outcome.   REHAB POTENTIAL: Good  CLINICAL DECISION MAKING: Evolving/moderate complexity  EVALUATION COMPLEXITY: Moderate  PLAN:  PT FREQUENCY: 1-2x/week  PT DURATION: 12 weeks  PLANNED INTERVENTIONS: Therapeutic exercises, Therapeutic activity, Neuromuscular re-education, Balance training, Gait training, Patient/Family education, Self Care, Joint mobilization, Stair training, Vestibular training,  Canalith repositioning, DME instructions, Dry Needling, Electrical stimulation, Spinal manipulation, Spinal mobilization, Cryotherapy, Moist heat, Manual therapy, and Re-evaluation  PLAN FOR NEXT SESSION: Progress therex, bed mobility, and balance training.    Lenda Kelp, PT 08/26/2022, 9:39 AM

## 2022-08-31 ENCOUNTER — Ambulatory Visit: Payer: Medicare HMO | Attending: Neurology

## 2022-08-31 DIAGNOSIS — R2681 Unsteadiness on feet: Secondary | ICD-10-CM | POA: Diagnosis present

## 2022-08-31 DIAGNOSIS — M6281 Muscle weakness (generalized): Secondary | ICD-10-CM | POA: Diagnosis present

## 2022-08-31 DIAGNOSIS — R269 Unspecified abnormalities of gait and mobility: Secondary | ICD-10-CM | POA: Diagnosis present

## 2022-08-31 DIAGNOSIS — M545 Low back pain, unspecified: Secondary | ICD-10-CM | POA: Insufficient documentation

## 2022-08-31 DIAGNOSIS — R278 Other lack of coordination: Secondary | ICD-10-CM | POA: Insufficient documentation

## 2022-08-31 DIAGNOSIS — R262 Difficulty in walking, not elsewhere classified: Secondary | ICD-10-CM | POA: Diagnosis present

## 2022-08-31 DIAGNOSIS — R2689 Other abnormalities of gait and mobility: Secondary | ICD-10-CM | POA: Insufficient documentation

## 2022-08-31 DIAGNOSIS — G8929 Other chronic pain: Secondary | ICD-10-CM | POA: Insufficient documentation

## 2022-08-31 NOTE — Therapy (Signed)
OUTPATIENT PHYSICAL THERAPY NEURO TREATMENT   Patient Name: [name] MRN: [id number] DOB:[birth year], 77 y.o., male Today's Date: 09/01/2022   PCP: Ardyth Man, PA- C REFERRING PROVIDER: Morene Crocker, MD  END OF SESSION:  PT End of Session - 08/31/22 1152     Visit Number 7    Number of Visits 24    Date for PT Re-Evaluation 11/01/22    Progress Note Due on Visit 10    PT Start Time 1148    PT Stop Time 1229    PT Time Calculation (min) 41 min    Equipment Utilized During Treatment Gait belt    Activity Tolerance Patient tolerated treatment well    Behavior During Therapy [facility] for tasks assessed/performed              Past Medical History:  Diagnosis Date   Anxiety    Arteriosclerosis of coronary artery 11/16/2011   Overview:  Stent 10/2011 stent rca 2015 with collaterals to lad which is chronically occluded    Benign enlargement of prostate    Benign essential HTN 06/11/2014   Benign prostatic hypertrophy without urinary obstruction 07/31/2014   Bilateral cataracts 05/16/2013   Overview:  [facility]     Bone spur of foot    Left   BP (high blood pressure) 11/09/2012   Cancer (HCC)    skin (forehead) and bladder   Carotid artery narrowing 02/08/2014   Depression    Detrusor hypertrophy    Diabetes (HCC)    Diabetes mellitus, type 2 (HCC) 12/12/2012   Diverticulosis    Dyspnea    Esophageal reflux    Esophageal reflux    Fothergill's neuralgia 08/07/2012   Overview:  [facility]    Gastritis    GERD (gastroesophageal reflux disease)    Headache    cluster headaches   Healed myocardial infarct 11/09/2012   Hearing loss in left ear    Heart disease    Hematuria    Hemorrhoids    History of hiatal hernia 12/14/2017   small    Hypercholesteremia    Lesion of bladder    Myocardial infarct (HCC)    Presence of stent in coronary artery 11/09/2012   Rectal bleeding    Trigeminal neuralgia    Trigeminal neuralgia    Valvular heart  disease    Vitamin D deficiency    Past Surgical History:  Procedure Laterality Date   APPENDECTOMY     BOTOX INJECTION N/A 12/21/2017   Procedure: Bladder BOTOX INJECTION;  Surgeon: Vanna Scotland, MD;  Location: [facility] ORS;  Service: Urology;  Laterality: N/A;   BOTOX INJECTION N/A 09/11/2018   Procedure: Bladder BOTOX INJECTION;  Surgeon: Vanna Scotland, MD;  Location: [facility] ORS;  Service: Urology;  Laterality: N/A;   CARDIAC CATHETERIZATION     CARDIAC CATHETERIZATION N/A 01/22/2015   Procedure: Left Heart Cath;  Surgeon: Lamar Blinks, MD;  Location: [facility] INVASIVE CV LAB;  Service: Cardiovascular;  Laterality: N/A;   CARDIAC CATHETERIZATION N/A 01/22/2015   Procedure: Coronary Stent Intervention;  Surgeon: Marcina Millard, MD;  Location: [facility] INVASIVE CV LAB;  Service: Cardiovascular;  Laterality: N/A;   CATARACT EXTRACTION, BILATERAL     COLONOSCOPY WITH PROPOFOL N/A 12/08/2015   Procedure: COLONOSCOPY WITH PROPOFOL;  Surgeon: Christena Deem, MD;  Location: [facility] ENDOSCOPY;  Service: Endoscopy;  Laterality: N/A;   COLONOSCOPY WITH PROPOFOL N/A 12/09/2015   Procedure: COLONOSCOPY WITH PROPOFOL;  Surgeon: Christena Deem, MD;  Location: [facility]  ENDOSCOPY;  Service: Endoscopy;  Laterality: N/A;   CORONARY ANGIOPLASTY     5 stents   CORONARY STENT PLACEMENT  2015   x5   CYSTOSCOPY N/A 09/11/2018   Procedure: CYSTOSCOPY;  Surgeon: Vanna Scotland, MD;  Location: [facility] ORS;  Service: Urology;  Laterality: N/A;   CYSTOSCOPY WITH BIOPSY N/A 12/21/2017   Procedure: CYSTOSCOPY WITH BIOPSY;  Surgeon: Vanna Scotland, MD;  Location: [facility] ORS;  Service: Urology;  Laterality: N/A;   ESOPHAGOGASTRODUODENOSCOPY (EGD) WITH PROPOFOL N/A 12/08/2015   Procedure: ESOPHAGOGASTRODUODENOSCOPY (EGD) WITH PROPOFOL;  Surgeon: Christena Deem, MD;  Location: [facility] ENDOSCOPY;  Service: Endoscopy;  Laterality: N/A;   ESOPHAGOGASTRODUODENOSCOPY (EGD) WITH PROPOFOL N/A 12/13/2017   Procedure:  ESOPHAGOGASTRODUODENOSCOPY (EGD) WITH PROPOFOL;  Surgeon: Christena Deem, MD;  Location: [facility] ENDOSCOPY;  Service: Endoscopy;  Laterality: N/A;   EYE SURGERY     HERNIA REPAIR     kidney tumor remove     TRANSURETHRAL RESECTION OF BLADDER TUMOR WITH GYRUS (TURBT-GYRUS)  12/2013   UMBILICAL HERNIA REPAIR     urethral meatotomy     Patient Active Problem List   Diagnosis Date Noted   Class 2 obesity due to excess calories with body mass index (BMI) of 36.0 to 36.9 in adult 04/11/2020   Swelling of limb 03/28/2020   Lymphedema 03/28/2020   Trigeminal neuralgia 12/25/2019   Lower limb ulcer, calf, left, limited to breakdown of skin (HCC) 12/25/2019   OSA (obstructive sleep apnea) 01/23/2019   Acute systolic CHF (congestive heart failure) (HCC) 12/12/2018   Bruising 07/10/2018   Diet-controlled type 2 diabetes mellitus (HCC) 03/24/2018   Microalbuminuria 03/24/2018   Dizziness 11/17/2017   Simple chronic bronchitis (HCC) 09/22/2017   SOBOE (shortness of breath on exertion) 02/18/2017   Chronic midline low back pain without sciatica 12/29/2016   Periodic limb movement disorder 12/29/2016   Benign essential tremor 06/22/2016   Pure hypercholesterolemia 06/20/2015   Erectile dysfunction due to arterial insufficiency 06/16/2015   Unstable angina (HCC) 01/22/2015   Abdominal aortic 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 rupture (HCC) 12/31/2014   Benign prostatic hypertrophy without urinary obstruction 07/31/2014   Enlarged prostate 07/31/2014   Benign essential HTN 06/11/2014   Carotid artery narrowing 02/08/2014   Carotid artery obstruction 02/08/2014   Bilateral carotid artery stenosis 02/08/2014   Chest pain 08/20/2013   Bilateral cataracts 05/16/2013   Cataract 05/16/2013   Clinical depression 12/12/2012   Diabetes mellitus, type 2 (HCC) 12/12/2012   Combined fat and carbohydrate induced hyperlipemia 12/12/2012   Major depressive disorder, single episode, unspecified 12/12/2012   Acid  reflux 11/09/2012   Presence of stent in coronary artery 11/09/2012   BP (high blood pressure) 11/09/2012   Healed myocardial infarct 11/09/2012   Gastro-esophageal reflux disease without esophagitis 11/09/2012   History of cardiovascular surgery 11/09/2012   Fothergill's neuralgia 08/07/2012   Swelling of testicle 04/06/2012   Disorder of male genital organ 04/06/2012   Swelling of the testicles 04/06/2012   Arteriosclerosis of coronary artery 11/16/2011   CAD in native artery 11/16/2011   Fatigue 06/04/2011   Avitaminosis D 11/30/2010    ONSET DATE: 03/17/2022- Worsening symptoms  REFERRING DIAG:  G20.A1 (ICD-10-CM) - Parkinsons  R26.2 (ICD-10-CM) - Difficulty walking    THERAPY DIAG:  Difficulty in walking, not elsewhere classified  Muscle weakness (generalized)  Other lack of coordination  Abnormality of gait and mobility  Rationale for Evaluation and Treatment: Rehabilitation  SUBJECTIVE:  SUBJECTIVE STATEMENT: Patient reports moving very slowly over past 2 days- no rationale but states not as active over last couple of days.   Pt accompanied by: significant other  PERTINENT HISTORY: Patient is a 77 year old male with referral to outpatient PT for Parkinsons and difficulty walking. He was recently seen by Neurology worsening symptoms of parkinson- difficulty with walking. He has past medical history of Arthritis, Bladder cancer, andTrigeminal Neuralgia.  PAIN:  Are you having pain? Yes: NPRS scale: 0/10 at rest but up to 8/10 at worst/10 Pain location: Right shoulder and right hip  Pain description: ache and sharp at times Aggravating factors: walking, up and down, overhead reaching with right arm Relieving factors: Rest, pain meds as needed  PRECAUTIONS: Fall  WEIGHT BEARING  RESTRICTIONS: No  FALLS: Has patient fallen in last 6 months? Yes. Number of falls 1  LIVING ENVIRONMENT: Lives with: lives with their spouse Lives in: House/apartment Stairs: Yes: External: 2 steps; has grab bar Has following equipment at home: Dan Humphreys - 2 wheeled, Environmental consultant - 4 wheeled, and Grab bars  PLOF: Independent with household mobility with device, Independent with community mobility with device, Independent with transfers, and Requires assistive device for independence  PATIENT GOALS: Get to where I can walk better again and get my legs stronger.   OBJECTIVE:   DIAGNOSTIC FINDINGS: none recent  COGNITION: Overall cognitive status: Within functional limits for tasks assessed   SENSATION: WFL  COORDINATION: Slow at times with initiation with UE and LE movement. Some freezing with turning/mobility.   EDEMA:  Wearing compression stockings   POSTURE: rounded shoulders, forward head, increased thoracic kyphosis, and flexed trunk   LOWER EXTREMITY ROM:     Active  Right Eval Left Eval  Hip flexion Limited <100 Limited < 100  Hip extension    Hip abduction    Hip adduction    Hip internal rotation    Hip external rotation    Knee flexion    Knee extension    Ankle dorsiflexion    Ankle plantarflexion    Ankle inversion    Ankle eversion     (Blank rows = not tested)  LOWER EXTREMITY MMT:    MMT Right Eval Left Eval  Hip flexion 3+ 3+  Hip extension 4 4  Hip abduction 4 4  Hip adduction 4 4  Hip internal rotation 4 4  Hip external rotation 4 4  Knee flexion 4 4  Knee extension 4 4-  Ankle dorsiflexion 4 4  Ankle plantarflexion 4 4  Ankle inversion 4 4  Ankle eversion 4 4  (Blank rows = not tested)    TRANSFERS: Assistive device utilized: Environmental consultant - 4 wheeled -Upright Sit to stand: CGA Stand to sit: CGA Chair to chair: CGA Floor:  not assessed   GAIT: Gait pattern: step through pattern, decreased arm swing- Right, decreased arm swing- Left,  decreased step length- Right, decreased step length- Left, decreased stance time- Right, decreased stance time- Left, decreased stride length, and shuffling Distance walked: 450 Assistive device utilized: Walker - 4 wheeled-upright Level of assistance: CGA Comments: Shuffling with some freezing with turning and into doorways- increased shuffling with distance.  FUNCTIONAL TESTS:  5 times sit to stand: 17.39 sec Timed up and go (TUG): 22.0 sec 6 minute walk test: 3:45 min and 450 feet total  10 meter walk test: 0.67 m/s Berg Balance Scale: To be assessed next visit  PATIENT SURVEYS:  FOTO 42  TODAY'S TREATMENT:  DATE: 08/31/2022   Therex:   Seated Thoracic rotation x 20 reps each direction  Seated AROM- Hip march x 20 reps alt each direction   Seated hip flex 3# AW 2 sets of 10 reps  Seated knee ext 3# AW 2 sets of 10 reps  Standing calf raises 3# AW- 2 sets of 10 reps   Sit to stand with Overhead BUE reach x 18 reps (VC for technique to lean forward - much improved with corrected technique)   Side step to left and right with dynamic reach to side x15 reps each Side (mild unsteadiness and slow coordination at times)   Side step to left and right with dynamic overhead reach with UE x 15 reps each side (within pain limit on right side)   Standing calf raises 5# AW 2 x 12 reps  Resistive gait = 300 feet with upright 4WW - 5# AW- some shuffling with distance and turning/going through threshold    PATIENT EDUCATION: Education details: Plan of care; purpose of functional outcome measures.  Person educated: Patient Education method: Explanation, Demonstration, Tactile cues, Verbal cues, and Handouts Education comprehension: verbalized understanding, returned demonstration, verbal cues required, tactile cues required, and needs further education  HOME  EXERCISE PROGRAM: Access Code: [email or web address] URL: [email or web address] Date: 08/11/2022 Prepared by: Maureen Ralphs  Exercises - Seated Lumbar Flexion Stretch  - 7 x weekly - 3 sets - 30 sec hold - Seated Thoracic Flexion and Rotation with Arms Crossed  - 1 x daily - 7 x weekly - 3 sets - 30 sec hold - Seated Figure 4 Piriformis Stretch  - 1 x daily - 7 x weekly - 3 sets - 10 reps  GOALS: Goals reviewed with patient? Yes  SHORT TERM GOALS: Target date: 09/20/2022  Pt will be independent with a comprehensive HEP in order to improve strength and balance in order to decrease fall risk and improve function at home and work.  Baseline: EVAL: Patient admits not performing much exercises - not moving around well.  Goal status: INITIAL   LONG TERM GOALS: Target date: 11/01/2022  Pt will improve FOTO to target score of 45 % to display perceived improvements in ability to complete ADL's.  Baseline: EVAL: 42 Goal status: INITIAL  2.  Pt will decrease 5TSTS by at least 5 seconds in order to demonstrate clinically significant improvement in LE strength.  Baseline: EVAL = 17.39 sec without UE support Goal status: INITIAL  3. Pt will increase by at least 0.15 m/s in order to demonstrate clinically significant improvement in community ambulation   Baseline: EVAL= 0.67 m/s Goal status: INITIAL  4.  Pt will improve BERG by at least 3 points in order to demonstrate clinically significant improvement in balance.   Baseline: EVAL: to be assessed next visit: 08/23/2022= 36/56 Goal status: INITIAL  5.  Pt will decrease TUG to below 14 seconds/decrease in order to demonstrate decreased fall risk. Baseline: EVAL: 22.00 sec with upright 4WW Goal status: INITIAL  6.  Pt will increase by at least 9m (157ft) in order to demonstrate clinically significant improvement in cardiopulmonary endurance and community ambulation  Baseline: EVAL: 3:45 min (450 feet with upright 4WW) Goal  status: INITIAL 7.   Patient will report returning to walking in to community places using most appropriate assistive device vs current use of scooter for improved abilities with mobility during community outings Baseline: EVAL: Current using scooter for many social/community outings  Goal status: INITIAL ASSESSMENT:  CLINICAL IMPRESSION:  Patient responded better today with less fatigue exhibited and reported. He was able to complete some larger amplitude activities with VC and able to complete prescribed reps.   PT will continue to benefit from skilled PT services to improve his overall strength and mobility to maximize his independent function while decreasing his fall risk.   OBJECTIVE IMPAIRMENTS: Abnormal gait, cardiopulmonary status limiting activity, decreased activity tolerance, decreased balance, decreased coordination, decreased endurance, decreased mobility, difficulty walking, decreased ROM, decreased strength, hypomobility, impaired flexibility, impaired sensation, impaired UE functional use, postural dysfunction, obesity, and pain.   ACTIVITY LIMITATIONS: carrying, lifting, bending, standing, squatting, stairs, transfers, continence, and reach over head  PARTICIPATION LIMITATIONS: meal prep, cleaning, laundry, driving, shopping, community activity, and yard work  PERSONAL FACTORS: Age and 3+ comorbidities: HTN, Arthritis, Low back pain, Trigeminal Neuralgia  are also affecting patient's functional outcome.   REHAB POTENTIAL: Good  CLINICAL DECISION MAKING: Evolving/moderate complexity  EVALUATION COMPLEXITY: Moderate  PLAN:  PT FREQUENCY: 1-2x/week  PT DURATION: 12 weeks  PLANNED INTERVENTIONS: Therapeutic exercises, Therapeutic activity, Neuromuscular re-education, Balance training, Gait training, Patient/Family education, Self Care, Joint mobilization, Stair training, Vestibular training, Canalith repositioning, DME instructions, Dry Needling, Electrical stimulation,  Spinal manipulation, Spinal mobilization, Cryotherapy, Moist heat, Manual therapy, and Re-evaluation  PLAN FOR NEXT SESSION: Progress therex, bed mobility, and balance training.    Lenda Kelp, PT 09/01/2022, 4:24 PM

## 2022-09-06 ENCOUNTER — Ambulatory Visit: Payer: Medicare HMO

## 2022-09-06 DIAGNOSIS — R262 Difficulty in walking, not elsewhere classified: Secondary | ICD-10-CM

## 2022-09-06 DIAGNOSIS — R2689 Other abnormalities of gait and mobility: Secondary | ICD-10-CM

## 2022-09-06 DIAGNOSIS — R269 Unspecified abnormalities of gait and mobility: Secondary | ICD-10-CM

## 2022-09-06 DIAGNOSIS — R278 Other lack of coordination: Secondary | ICD-10-CM

## 2022-09-06 DIAGNOSIS — R2681 Unsteadiness on feet: Secondary | ICD-10-CM

## 2022-09-06 DIAGNOSIS — M6281 Muscle weakness (generalized): Secondary | ICD-10-CM

## 2022-09-06 NOTE — Therapy (Signed)
OUTPATIENT PHYSICAL THERAPY NEURO TREATMENT   Patient Name: IVANN GUADAGNOLI MRN: [id number] DOB:1946-02-29, 77 y.o., male Today's Date: 09/07/2022   PCP: Ardyth Man, PA- C REFERRING PROVIDER: Morene Crocker, MD  END OF SESSION:  PT End of Session - 09/06/22 1409     Visit Number 8    Number of Visits 24    Date for PT Re-Evaluation 11/01/22    Progress Note Due on Visit 10    PT Start Time 1400    PT Stop Time 1440    PT Time Calculation (min) 40 min    Equipment Utilized During Treatment Gait belt    Activity Tolerance Patient tolerated treatment well    Behavior During Therapy [facility] for tasks assessed/performed              Past Medical History:  Diagnosis Date   Anxiety    Arteriosclerosis of coronary artery 11/16/2011   Overview:  Stent 10/2011 stent rca 2015 with collaterals to lad which is chronically occluded    Benign enlargement of prostate    Benign essential HTN 06/11/2014   Benign prostatic hypertrophy without urinary obstruction 07/31/2014   Bilateral cataracts 05/16/2013   Overview:  [facility]     Bone spur of foot    Left   BP (high blood pressure) 11/09/2012   Cancer (HCC)    skin (forehead) and bladder   Carotid artery narrowing 02/08/2014   Depression    Detrusor hypertrophy    Diabetes (HCC)    Diabetes mellitus, type 2 (HCC) 12/12/2012   Diverticulosis    Dyspnea    Esophageal reflux    Esophageal reflux    Fothergill's neuralgia 08/07/2012   Overview:  [facility]    Gastritis    GERD (gastroesophageal reflux disease)    Headache    cluster headaches   Healed myocardial infarct 11/09/2012   Hearing loss in left ear    Heart disease    Hematuria    Hemorrhoids    History of hiatal hernia 12/14/2017   small    Hypercholesteremia    Lesion of bladder    Myocardial infarct (HCC)    Presence of stent in coronary artery 11/09/2012   Rectal bleeding    Trigeminal neuralgia    Trigeminal neuralgia    Valvular heart  disease    Vitamin D deficiency    Past Surgical History:  Procedure Laterality Date   APPENDECTOMY     BOTOX INJECTION N/A 12/21/2017   Procedure: Bladder BOTOX INJECTION;  Surgeon: Vanna Scotland, MD;  Location: [facility] ORS;  Service: Urology;  Laterality: N/A;   BOTOX INJECTION N/A 09/11/2018   Procedure: Bladder BOTOX INJECTION;  Surgeon: Vanna Scotland, MD;  Location: [facility] ORS;  Service: Urology;  Laterality: N/A;   CARDIAC CATHETERIZATION     CARDIAC CATHETERIZATION N/A 01/22/2015   Procedure: Left Heart Cath;  Surgeon: Lamar Blinks, MD;  Location: [facility] INVASIVE CV LAB;  Service: Cardiovascular;  Laterality: N/A;   CARDIAC CATHETERIZATION N/A 01/22/2015   Procedure: Coronary Stent Intervention;  Surgeon: Marcina Millard, MD;  Location: [facility] INVASIVE CV LAB;  Service: Cardiovascular;  Laterality: N/A;   CATARACT EXTRACTION, BILATERAL     COLONOSCOPY WITH PROPOFOL N/A 12/08/2015   Procedure: COLONOSCOPY WITH PROPOFOL;  Surgeon: Christena Deem, MD;  Location[facility] ENDOSCOPY;  Service: Endoscopy;  Laterality: N/A;   COLONOSCOPY WITH PROPOFOL N/A 12/09/2015   Procedure: COLONOSCOPY WITH PROPOFOL;  Surgeon: Christena Deem, MD;  Location: [facility]  ENDOSCOPY;  Service: Endoscopy;  Laterality: N/A;   CORONARY ANGIOPLASTY     5 stents   CORONARY STENT PLACEMENT  2015   x5   CYSTOSCOPY N/A 09/11/2018   Procedure: CYSTOSCOPY;  Surgeon: Vanna Scotland, MD;  Location: [facility] ORS;  Service: Urology;  Laterality: N/A;   CYSTOSCOPY WITH BIOPSY N/A 12/21/2017   Procedure: CYSTOSCOPY WITH BIOPSY;  Surgeon: Vanna Scotland, MD;  Location: [facility] ORS;  Service: Urology;  Laterality: N/A;   ESOPHAGOGASTRODUODENOSCOPY (EGD) WITH PROPOFOL N/A 12/08/2015   Procedure: ESOPHAGOGASTRODUODENOSCOPY (EGD) WITH PROPOFOL;  Surgeon: Christena Deem, MD;  Location: [facility] ENDOSCOPY;  Service: Endoscopy;  Laterality: N/A;   ESOPHAGOGASTRODUODENOSCOPY (EGD) WITH PROPOFOL N/A 12/13/2017   Procedure:  ESOPHAGOGASTRODUODENOSCOPY (EGD) WITH PROPOFOL;  Surgeon: Christena Deem, MD;  Location: [facility] ENDOSCOPY;  Service: Endoscopy;  Laterality: N/A;   EYE SURGERY     HERNIA REPAIR     kidney tumor remove     TRANSURETHRAL RESECTION OF BLADDER TUMOR WITH GYRUS (TURBT-GYRUS)  12/2013   UMBILICAL HERNIA REPAIR     urethral meatotomy     Patient Active Problem List   Diagnosis Date Noted   Class 2 obesity due to excess calories with body mass index (BMI) of 36.0 to 36.9 in adult 04/11/2020   Swelling of limb 03/28/2020   Lymphedema 03/28/2020   Trigeminal neuralgia 12/25/2019   Lower limb ulcer, calf, left, limited to breakdown of skin (HCC) 12/25/2019   OSA (obstructive sleep apnea) 01/23/2019   Acute systolic CHF (congestive heart failure) (HCC) 12/12/2018   Bruising 07/10/2018   Diet-controlled type 2 diabetes mellitus (HCC) 03/24/2018   Microalbuminuria 03/24/2018   Dizziness 11/17/2017   Simple chronic bronchitis (HCC) 09/22/2017   SOBOE (shortness of breath on exertion) 02/18/2017   Chronic midline low back pain without sciatica 12/29/2016   Periodic limb movement disorder 12/29/2016   Benign essential tremor 06/22/2016   Pure hypercholesterolemia 06/20/2015   Erectile dysfunction due to arterial insufficiency 06/16/2015   Unstable angina (HCC) 01/22/2015   Abdominal aortic 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 rupture (HCC) 12/31/2014   Benign prostatic hypertrophy without urinary obstruction 07/31/2014   Enlarged prostate 07/31/2014   Benign essential HTN 06/11/2014   Carotid artery narrowing 02/08/2014   Carotid artery obstruction 02/08/2014   Bilateral carotid artery stenosis 02/08/2014   Chest pain 08/20/2013   Bilateral cataracts 05/16/2013   Cataract 05/16/2013   Clinical depression 12/12/2012   Diabetes mellitus, type 2 (HCC) 12/12/2012   Combined fat and carbohydrate induced hyperlipemia 12/12/2012   Major depressive disorder, single episode, unspecified 12/12/2012   Acid  reflux 11/09/2012   Presence of stent in coronary artery 11/09/2012   BP (high blood pressure) 11/09/2012   Healed myocardial infarct 11/09/2012   Gastro-esophageal reflux disease without esophagitis 11/09/2012   History of cardiovascular surgery 11/09/2012   Fothergill's neuralgia 08/07/2012   Swelling of testicle 04/06/2012   Disorder of male genital organ 04/06/2012   Swelling of the testicles 04/06/2012   Arteriosclerosis of coronary artery 11/16/2011   CAD in native artery 11/16/2011   Fatigue 06/04/2011   Avitaminosis D 11/30/2010    ONSET DATE: 03/17/2022- Worsening symptoms  REFERRING DIAG:  G20.A1 (ICD-10-CM) - Parkinsons  R26.2 (ICD-10-CM) - Difficulty walking    THERAPY DIAG:  Difficulty in walking, not elsewhere classified  Muscle weakness (generalized)  Other lack of coordination  Abnormality of gait and mobility  Other abnormalities of gait and mobility  Unsteadiness on feet  Rationale for Evaluation and Treatment: Rehabilitation  SUBJECTIVE:  SUBJECTIVE STATEMENT: Patient reports doing okay but still not walking well- shuffling more  Pt accompanied by: significant other  PERTINENT HISTORY: Patient is a 77 year old male with referral to outpatient PT for Parkinsons and difficulty walking. He was recently seen by Neurology worsening symptoms of parkinson- difficulty with walking. He has past medical history of Arthritis, Bladder cancer, andTrigeminal Neuralgia.  PAIN:  Are you having pain? Yes: NPRS scale: 0/10 at rest but up to 8/10 at worst/10 Pain location: Right shoulder and right hip  Pain description: ache and sharp at times Aggravating factors: walking, up and down, overhead reaching with right arm Relieving factors: Rest, pain meds as needed  PRECAUTIONS:  Fall  WEIGHT BEARING RESTRICTIONS: No  FALLS: Has patient fallen in last 6 months? Yes. Number of falls 1  LIVING ENVIRONMENT: Lives with: lives with their spouse Lives in: House/apartment Stairs: Yes: External: 2 steps; has grab bar Has following equipment at home: Dan Humphreys - 2 wheeled, Environmental consultant - 4 wheeled, and Grab bars  PLOF: Independent with household mobility with device, Independent with community mobility with device, Independent with transfers, and Requires assistive device for independence  PATIENT GOALS: Get to where I can walk better again and get my legs stronger.   OBJECTIVE:   DIAGNOSTIC FINDINGS: none recent  COGNITION: Overall cognitive status: Within functional limits for tasks assessed   SENSATION: WFL  COORDINATION: Slow at times with initiation with UE and LE movement. Some freezing with turning/mobility.   EDEMA:  Wearing compression stockings   POSTURE: rounded shoulders, forward head, increased thoracic kyphosis, and flexed trunk   LOWER EXTREMITY ROM:     Active  Right Eval Left Eval  Hip flexion Limited <100 Limited < 100  Hip extension    Hip abduction    Hip adduction    Hip internal rotation    Hip external rotation    Knee flexion    Knee extension    Ankle dorsiflexion    Ankle plantarflexion    Ankle inversion    Ankle eversion     (Blank rows = not tested)  LOWER EXTREMITY MMT:    MMT Right Eval Left Eval  Hip flexion 3+ 3+  Hip extension 4 4  Hip abduction 4 4  Hip adduction 4 4  Hip internal rotation 4 4  Hip external rotation 4 4  Knee flexion 4 4  Knee extension 4 4-  Ankle dorsiflexion 4 4  Ankle plantarflexion 4 4  Ankle inversion 4 4  Ankle eversion 4 4  (Blank rows = not tested)    TRANSFERS: Assistive device utilized: Environmental consultant - 4 wheeled -Upright Sit to stand: CGA Stand to sit: CGA Chair to chair: CGA Floor:  not assessed   GAIT: Gait pattern: step through pattern, decreased arm swing- Right,  decreased arm swing- Left, decreased step length- Right, decreased step length- Left, decreased stance time- Right, decreased stance time- Left, decreased stride length, and shuffling Distance walked: 450 Assistive device utilized: Walker - 4 wheeled-upright Level of assistance: CGA Comments: Shuffling with some freezing with turning and into doorways- increased shuffling with distance.  FUNCTIONAL TESTS:  5 times sit to stand: 17.39 sec Timed up and go (TUG): 22.0 sec 6 minute walk test: 3:45 min and 450 feet total  10 meter walk test: 0.67 m/s Berg Balance Scale: To be assessed next visit  PATIENT SURVEYS:  FOTO 42  TODAY'S TREATMENT:  DATE: 09/06/2022   Therex:    Nustep- Interval- LE Only with level 1-6 x 6 min total at 0.18 mi   Standing step tap in // bars onto 6" block without UE support- 20 reps each side- (some unsteadiness initially with poor step height- quickly improved)   \Seated Thoracic rotation x 20 reps each direction  Seated AROM- Hip march x 20 reps alt each direction  Sit to stand with Overhead BUE reach x 18 reps (VC for technique to lean forward - much improved with corrected technique)   TDN Treatment: (Unbilled)  Patient consent: After explanation of TDN Rationale, Procedures, outcomes, and potential side effects, patient verbalized consent to TDN treatment. Region/Dx: Left Upper trap pain Muscles Treated: Left upper trap pain (3 needles- 0.30x 30)  Post treatment pain/response: Patient reported some immediate relief with left upper trap pain and 2 local twitch repsponses Post treatment Instructions: Patient instructed to expect mild to moderate muscle soreness this evening and tomorrow. Patient instructed to continued prescribed home exercise program. Since dry needling over lung field, patient instructed of signs and symptoms of  pneumothorax. Patient also educated on signs and symptoms of infection, however unlikely, and to seek immediate medical attention shoulder thy occur. Patient verbalized understanding of these instructions.    PATIENT EDUCATION: Education details: Plan of care; purpose of functional outcome measures.  Person educated: Patient Education method: Explanation, Demonstration, Tactile cues, Verbal cues, and Handouts Education comprehension: verbalized understanding, returned demonstration, verbal cues required, tactile cues required, and needs further education  HOME EXERCISE PROGRAM: Access Code: [email or web address] URL: [email or web address] Date: 08/11/2022 Prepared by: Maureen Ralphs  Exercises - Seated Lumbar Flexion Stretch  - 7 x weekly - 3 sets - 30 sec hold - Seated Thoracic Flexion and Rotation with Arms Crossed  - 1 x daily - 7 x weekly - 3 sets - 30 sec hold - Seated Figure 4 Piriformis Stretch  - 1 x daily - 7 x weekly - 3 sets - 10 reps  GOALS: Goals reviewed with patient? Yes  SHORT TERM GOALS: Target date: 09/20/2022  Pt will be independent with a comprehensive HEP in order to improve strength and balance in order to decrease fall risk and improve function at home and work.  Baseline: EVAL: Patient admits not performing much exercises - not moving around well.  Goal status: INITIAL   LONG TERM GOALS: Target date: 11/01/2022  Pt will improve FOTO to target score of 45 % to display perceived improvements in ability to complete ADL's.  Baseline: EVAL: 42 Goal status: INITIAL  2.  Pt will decrease 5TSTS by at least 5 seconds in order to demonstrate clinically significant improvement in LE strength.  Baseline: EVAL = 17.39 sec without UE support Goal status: INITIAL  3. Pt will increase by at least 0.15 m/s in order to demonstrate clinically significant improvement in community ambulation   Baseline: EVAL= 0.67 m/s Goal status: INITIAL  4.  Pt will improve  BERG by at least 3 points in order to demonstrate clinically significant improvement in balance.   Baseline: EVAL: to be assessed next visit: 08/23/2022= 36/56 Goal status: INITIAL  5.  Pt will decrease TUG to below 14 seconds/decrease in order to demonstrate decreased fall risk. Baseline: EVAL: 22.00 sec with upright 4WW Goal status: INITIAL  6.  Pt will increase by at least 21m (170ft) in order to demonstrate clinically significant improvement in cardiopulmonary endurance and community ambulation  Baseline: EVAL: 3:45 min (450 feet with upright  1OX) Goal status: INITIAL 7.   Patient will report returning to walking in to community places using most appropriate assistive device vs current use of scooter for improved abilities with mobility during community outings Baseline: EVAL: Current using scooter for many social/community outings  Goal status: INITIAL ASSESSMENT:  CLINICAL IMPRESSION: Patient was initially working on some LE strengthening but was listing neck to left and complaining of left neck/upper trap pain- Per palpation- he was very tight with multiple taut bands left UT region. He was assessed and appropriate for Dry needling. He responded well with improved pain after needling.    PT will continue to benefit from skilled PT services to improve his overall strength and mobility to maximize his independent function while decreasing his fall risk.   OBJECTIVE IMPAIRMENTS: Abnormal gait, cardiopulmonary status limiting activity, decreased activity tolerance, decreased balance, decreased coordination, decreased endurance, decreased mobility, difficulty walking, decreased ROM, decreased strength, hypomobility, impaired flexibility, impaired sensation, impaired UE functional use, postural dysfunction, obesity, and pain.   ACTIVITY LIMITATIONS: carrying, lifting, bending, standing, squatting, stairs, transfers, continence, and reach over head  PARTICIPATION LIMITATIONS: meal prep,  cleaning, laundry, driving, shopping, community activity, and yard work  PERSONAL FACTORS: Age and 3+ comorbidities: HTN, Arthritis, Low back pain, Trigeminal Neuralgia  are also affecting patient's functional outcome.   REHAB POTENTIAL: Good  CLINICAL DECISION MAKING: Evolving/moderate complexity  EVALUATION COMPLEXITY: Moderate  PLAN:  PT FREQUENCY: 1-2x/week  PT DURATION: 12 weeks  PLANNED INTERVENTIONS: Therapeutic exercises, Therapeutic activity, Neuromuscular re-education, Balance training, Gait training, Patient/Family education, Self Care, Joint mobilization, Stair training, Vestibular training, Canalith repositioning, DME instructions, Dry Needling, Electrical stimulation, Spinal manipulation, Spinal mobilization, Cryotherapy, Moist heat, Manual therapy, and Re-evaluation  PLAN FOR NEXT SESSION: Progress therex, bed mobility, and balance training.    Lenda Kelp, PT 09/07/2022, 9:52 AM

## 2022-09-08 ENCOUNTER — Ambulatory Visit: Payer: Medicare HMO

## 2022-09-08 DIAGNOSIS — R278 Other lack of coordination: Secondary | ICD-10-CM

## 2022-09-08 DIAGNOSIS — G8929 Other chronic pain: Secondary | ICD-10-CM

## 2022-09-08 DIAGNOSIS — M6281 Muscle weakness (generalized): Secondary | ICD-10-CM

## 2022-09-08 DIAGNOSIS — R262 Difficulty in walking, not elsewhere classified: Secondary | ICD-10-CM

## 2022-09-08 DIAGNOSIS — R269 Unspecified abnormalities of gait and mobility: Secondary | ICD-10-CM

## 2022-09-08 DIAGNOSIS — R2689 Other abnormalities of gait and mobility: Secondary | ICD-10-CM

## 2022-09-08 DIAGNOSIS — R2681 Unsteadiness on feet: Secondary | ICD-10-CM

## 2022-09-08 NOTE — Therapy (Signed)
OUTPATIENT PHYSICAL THERAPY NEURO TREATMENT   Patient Name: [name] MRN: [id number] DOB:1945-11-30, 77 y.o., male Today's Date: 09/09/2022   PCP: Ardyth Man, PA- C REFERRING PROVIDER: Morene Crocker, MD  END OF SESSION:  PT End of Session - 09/08/22 1427     Visit Number 9    Number of Visits 24    Date for PT Re-Evaluation 11/01/22    Progress Note Due on Visit 10    PT Start Time 1401    PT Stop Time 1444    PT Time Calculation (min) 43 min    Equipment Utilized During Treatment Gait belt    Activity Tolerance Patient tolerated treatment well    Behavior During Therapy [facility] for tasks assessed/performed               Past Medical History:  Diagnosis Date   Anxiety    Arteriosclerosis of coronary artery 11/16/2011   Overview:  Stent 10/2011 stent rca 2015 with collaterals to lad which is chronically occluded    Benign enlargement of prostate    Benign essential HTN 06/11/2014   Benign prostatic hypertrophy without urinary obstruction 07/31/2014   Bilateral cataracts 05/16/2013   Overview:  [facility]     Bone spur of foot    Left   BP (high blood pressure) 11/09/2012   Cancer (HCC)    skin (forehead) and bladder   Carotid artery narrowing 02/08/2014   Depression    Detrusor hypertrophy    Diabetes (HCC)    Diabetes mellitus, type 2 (HCC) 12/12/2012   Diverticulosis    Dyspnea    Esophageal reflux    Esophageal reflux    Fothergill's neuralgia 08/07/2012   Overview:  [facility]    Gastritis    GERD (gastroesophageal reflux disease)    Headache    cluster headaches   Healed myocardial infarct 11/09/2012   Hearing loss in left ear    Heart disease    Hematuria    Hemorrhoids    History of hiatal hernia 12/14/2017   small    Hypercholesteremia    Lesion of bladder    Myocardial infarct (HCC)    Presence of stent in coronary artery 11/09/2012   Rectal bleeding    Trigeminal neuralgia    Trigeminal neuralgia    Valvular heart  disease    Vitamin D deficiency    Past Surgical History:  Procedure Laterality Date   APPENDECTOMY     BOTOX INJECTION N/A 12/21/2017   Procedure: Bladder BOTOX INJECTION;  Surgeon: Vanna Scotland, MD;  Location: [facility] ORS;  Service: Urology;  Laterality: N/A;   BOTOX INJECTION N/A 09/11/2018   Procedure: Bladder BOTOX INJECTION;  Surgeon: Vanna Scotland, MD;  Location: [facility] ORS;  Service: Urology;  Laterality: N/A;   CARDIAC CATHETERIZATION     CARDIAC CATHETERIZATION N/A 01/22/2015   Procedure: Left Heart Cath;  Surgeon: Lamar Blinks, MD;  Location: [facility] INVASIVE CV LAB;  Service: Cardiovascular;  Laterality: N/A;   CARDIAC CATHETERIZATION N/A 01/22/2015   Procedure: Coronary Stent Intervention;  Surgeon: Marcina Millard, MD;  Location: [facility] INVASIVE CV LAB;  Service: Cardiovascular;  Laterality: N/A;   CATARACT EXTRACTION, BILATERAL     COLONOSCOPY WITH PROPOFOL N/A 12/08/2015   Procedure: COLONOSCOPY WITH PROPOFOL;  Surgeon: Christena Deem, MD;  Location: [facility] ENDOSCOPY;  Service: Endoscopy;  Laterality: N/A;   COLONOSCOPY WITH PROPOFOL N/A 12/09/2015   Procedure: COLONOSCOPY WITH PROPOFOL;  Surgeon: Christena Deem, MD;  Location:  [facility] ENDOSCOPY;  Service: Endoscopy;  Laterality: N/A;   CORONARY ANGIOPLASTY     5 stents   CORONARY STENT PLACEMENT  2015   x5   CYSTOSCOPY N/A 09/11/2018   Procedure: CYSTOSCOPY;  Surgeon: Vanna Scotland, MD;  Location: [facility] ORS;  Service: Urology;  Laterality: N/A;   CYSTOSCOPY WITH BIOPSY N/A 12/21/2017   Procedure: CYSTOSCOPY WITH BIOPSY;  Surgeon: Vanna Scotland, MD;  Location: [facility] ORS;  Service: Urology;  Laterality: N/A;   ESOPHAGOGASTRODUODENOSCOPY (EGD) WITH PROPOFOL N/A 12/08/2015   Procedure: ESOPHAGOGASTRODUODENOSCOPY (EGD) WITH PROPOFOL;  Surgeon: Christena Deem, MD;  Location: [facility] ENDOSCOPY;  Service: Endoscopy;  Laterality: N/A;   ESOPHAGOGASTRODUODENOSCOPY (EGD) WITH PROPOFOL N/A 12/13/2017   Procedure:  ESOPHAGOGASTRODUODENOSCOPY (EGD) WITH PROPOFOL;  Surgeon: Christena Deem, MD;  Location: [facility] ENDOSCOPY;  Service: Endoscopy;  Laterality: N/A;   EYE SURGERY     HERNIA REPAIR     kidney tumor remove     TRANSURETHRAL RESECTION OF BLADDER TUMOR WITH GYRUS (TURBT-GYRUS)  12/2013   UMBILICAL HERNIA REPAIR     urethral meatotomy     Patient Active Problem List   Diagnosis Date Noted   Class 2 obesity due to excess calories with body mass index (BMI) of 36.0 to 36.9 in adult 04/11/2020   Swelling of limb 03/28/2020   Lymphedema 03/28/2020   Trigeminal neuralgia 12/25/2019   Lower limb ulcer, calf, left, limited to breakdown of skin (HCC) 12/25/2019   OSA (obstructive sleep apnea) 01/23/2019   Acute systolic CHF (congestive heart failure) (HCC) 12/12/2018   Bruising 07/10/2018   Diet-controlled type 2 diabetes mellitus (HCC) 03/24/2018   Microalbuminuria 03/24/2018   Dizziness 11/17/2017   Simple chronic bronchitis (HCC) 09/22/2017   SOBOE (shortness of breath on exertion) 02/18/2017   Chronic midline low back pain without sciatica 12/29/2016   Periodic limb movement disorder 12/29/2016   Benign essential tremor 06/22/2016   Pure hypercholesterolemia 06/20/2015   Erectile dysfunction due to arterial insufficiency 06/16/2015   Unstable angina (HCC) 01/22/2015   Abdominal aortic 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 rupture (HCC) 12/31/2014   Benign prostatic hypertrophy without urinary obstruction 07/31/2014   Enlarged prostate 07/31/2014   Benign essential HTN 06/11/2014   Carotid artery narrowing 02/08/2014   Carotid artery obstruction 02/08/2014   Bilateral carotid artery stenosis 02/08/2014   Chest pain 08/20/2013   Bilateral cataracts 05/16/2013   Cataract 05/16/2013   Clinical depression 12/12/2012   Diabetes mellitus, type 2 (HCC) 12/12/2012   Combined fat and carbohydrate induced hyperlipemia 12/12/2012   Major depressive disorder, single episode, unspecified 12/12/2012   Acid  reflux 11/09/2012   Presence of stent in coronary artery 11/09/2012   BP (high blood pressure) 11/09/2012   Healed myocardial infarct 11/09/2012   Gastro-esophageal reflux disease without esophagitis 11/09/2012   History of cardiovascular surgery 11/09/2012   Fothergill's neuralgia 08/07/2012   Swelling of testicle 04/06/2012   Disorder of male genital organ 04/06/2012   Swelling of the testicles 04/06/2012   Arteriosclerosis of coronary artery 11/16/2011   CAD in native artery 11/16/2011   Fatigue 06/04/2011   Avitaminosis D 11/30/2010    ONSET DATE: 03/17/2022- Worsening symptoms  REFERRING DIAG:  G20.A1 (ICD-10-CM) - Parkinsons  R26.2 (ICD-10-CM) - Difficulty walking    THERAPY DIAG:  Difficulty in walking, not elsewhere classified  Muscle weakness (generalized)  Other lack of coordination  Abnormality of gait and mobility  Other abnormalities of gait and mobility  Unsteadiness on feet  Chronic bilateral low back pain without sciatica  Rationale for Evaluation and Treatment: Rehabilitation  SUBJECTIVE:                                                                                                                                                                                             SUBJECTIVE STATEMENT: Patient reports his low back pain is worse overall this week- 6/10 earlier today. He did report good relief with his neck pain after last visit with dry needling.  Pt accompanied by: significant other  PERTINENT HISTORY: Patient is a 77 year old male with referral to outpatient PT for Parkinsons and difficulty walking. He was recently seen by Neurology worsening symptoms of parkinson- difficulty with walking. He has past medical history of Arthritis, Bladder cancer, andTrigeminal Neuralgia.  PAIN:  Are you having pain? Yes: NPRS scale: 0/10 at rest but up to 8/10 at worst/10 Pain location: Right shoulder and right hip  Pain description: ache and sharp at  times Aggravating factors: walking, up and down, overhead reaching with right arm Relieving factors: Rest, pain meds as needed  PRECAUTIONS: Fall  WEIGHT BEARING RESTRICTIONS: No  FALLS: Has patient fallen in last 6 months? Yes. Number of falls 1  LIVING ENVIRONMENT: Lives with: lives with their spouse Lives in: House/apartment Stairs: Yes: External: 2 steps; has grab bar Has following equipment at home: Dan Humphreys - 2 wheeled, Environmental consultant - 4 wheeled, and Grab bars  PLOF: Independent with household mobility with device, Independent with community mobility with device, Independent with transfers, and Requires assistive device for independence  PATIENT GOALS: Get to where I can walk better again and get my legs stronger.   OBJECTIVE:   DIAGNOSTIC FINDINGS: none recent  COGNITION: Overall cognitive status: Within functional limits for tasks assessed   SENSATION: WFL  COORDINATION: Slow at times with initiation with UE and LE movement. Some freezing with turning/mobility.   EDEMA:  Wearing compression stockings   POSTURE: rounded shoulders, forward head, increased thoracic kyphosis, and flexed trunk   LOWER EXTREMITY ROM:     Active  Right Eval Left Eval  Hip flexion Limited <100 Limited < 100  Hip extension    Hip abduction    Hip adduction    Hip internal rotation    Hip external rotation    Knee flexion    Knee extension    Ankle dorsiflexion    Ankle plantarflexion    Ankle inversion    Ankle eversion     (Blank rows = not tested)  LOWER EXTREMITY MMT:    MMT Right Eval Left Eval  Hip flexion 3+ 3+  Hip extension 4 4  Hip abduction 4  4  Hip adduction 4 4  Hip internal rotation 4 4  Hip external rotation 4 4  Knee flexion 4 4  Knee extension 4 4-  Ankle dorsiflexion 4 4  Ankle plantarflexion 4 4  Ankle inversion 4 4  Ankle eversion 4 4  (Blank rows = not tested)    TRANSFERS: Assistive device utilized: Environmental consultant - 4 wheeled -Upright Sit to stand:  CGA Stand to sit: CGA Chair to chair: CGA Floor:  not assessed   GAIT: Gait pattern: step through pattern, decreased arm swing- Right, decreased arm swing- Left, decreased step length- Right, decreased step length- Left, decreased stance time- Right, decreased stance time- Left, decreased stride length, and shuffling Distance walked: 450 Assistive device utilized: Walker - 4 wheeled-upright Level of assistance: CGA Comments: Shuffling with some freezing with turning and into doorways- increased shuffling with distance.  FUNCTIONAL TESTS:  5 times sit to stand: 17.39 sec Timed up and go (TUG): 22.0 sec 6 minute walk test: 3:45 min and 450 feet total  10 meter walk test: 0.67 m/s Berg Balance Scale: To be assessed next visit  PATIENT SURVEYS:  FOTO 42  TODAY'S TREATMENT:                                                                                                                              DATE: 09/08/2022   Therex:    Nustep- Interval- LE Only with level 1-6 x 6 min total at 0.18 mi  High knee marching without UE support ( knee up toward top of // bars) - light UE touch    Standing step tap in // bars onto 6" block without UE support- 20 reps each side- (some unsteadiness initially with poor step height- quickly improved)   Standing Thoracic rotation x 20 reps each direction  Seated AROM- Hip march x 20 reps alt each direction  Sit to stand with Overhead BUE reach x 18 reps (VC for technique to lean forward - much improved with corrected technique)   Standing - Side step and reach cross body to pick up cones (3) positioned on top of // bars. Increased VC for sequencing    PATIENT EDUCATION: Education details: Plan of care; purpose of functional outcome measures.  Person educated: Patient Education method: Explanation, Demonstration, Tactile cues, Verbal cues, and Handouts Education comprehension: verbalized understanding, returned demonstration, verbal cues  required, tactile cues required, and needs further education  HOME EXERCISE PROGRAM: Access Code: [email or web address] URL: [email or web address] Date: 08/11/2022 Prepared by: Maureen Ralphs  Exercises - Seated Lumbar Flexion Stretch  - 7 x weekly - 3 sets - 30 sec hold - Seated Thoracic Flexion and Rotation with Arms Crossed  - 1 x daily - 7 x weekly - 3 sets - 30 sec hold - Seated Figure 4 Piriformis Stretch  - 1 x daily - 7 x weekly - 3 sets - 10 reps  GOALS: Goals reviewed with patient? Yes  SHORT TERM GOALS:  Target date: 09/20/2022  Pt will be independent with a comprehensive HEP in order to improve strength and balance in order to decrease fall risk and improve function at home and work.  Baseline: EVAL: Patient admits not performing much exercises - not moving around well.  Goal status: INITIAL   LONG TERM GOALS: Target date: 11/01/2022  Pt will improve FOTO to target score of 45 % to display perceived improvements in ability to complete ADL's.  Baseline: EVAL: 42 Goal status: INITIAL  2.  Pt will decrease 5TSTS by at least 5 seconds in order to demonstrate clinically significant improvement in LE strength.  Baseline: EVAL = 17.39 sec without UE support Goal status: INITIAL  3. Pt will increase by at least 0.15 m/s in order to demonstrate clinically significant improvement in community ambulation   Baseline: EVAL= 0.67 m/s Goal status: INITIAL  4.  Pt will improve BERG by at least 3 points in order to demonstrate clinically significant improvement in balance.   Baseline: EVAL: to be assessed next visit: 08/23/2022= 36/56 Goal status: INITIAL  5.  Pt will decrease TUG to below 14 seconds/decrease in order to demonstrate decreased fall risk. Baseline: EVAL: 22.00 sec with upright 4WW Goal status: INITIAL  6.  Pt will increase by at least 41m (164ft) in order to demonstrate clinically significant improvement in cardiopulmonary endurance and community  ambulation  Baseline: EVAL: 3:45 min (450 feet with upright 4WW) Goal status: INITIAL 7.   Patient will report returning to walking in to community places using most appropriate assistive device vs current use of scooter for improved abilities with mobility during community outings Baseline: EVAL: Current using scooter for many social/community outings  Goal status: INITIAL ASSESSMENT:  CLINICAL IMPRESSION: Patient presented with good motivation for today's session despite complaint of low back pain/stiffness upon arrival. He performed well with all standing and seated therex and reported feeling much better after increased mobility. Encouraged him to remain active and continue with daily ROM activities.  PT will continue to benefit from skilled PT services to improve his overall strength and mobility to maximize his independent function while decreasing his fall risk.   OBJECTIVE IMPAIRMENTS: Abnormal gait, cardiopulmonary status limiting activity, decreased activity tolerance, decreased balance, decreased coordination, decreased endurance, decreased mobility, difficulty walking, decreased ROM, decreased strength, hypomobility, impaired flexibility, impaired sensation, impaired UE functional use, postural dysfunction, obesity, and pain.   ACTIVITY LIMITATIONS: carrying, lifting, bending, standing, squatting, stairs, transfers, continence, and reach over head  PARTICIPATION LIMITATIONS: meal prep, cleaning, laundry, driving, shopping, community activity, and yard work  PERSONAL FACTORS: Age and 3+ comorbidities: HTN, Arthritis, Low back pain, Trigeminal Neuralgia  are also affecting patient's functional outcome.   REHAB POTENTIAL: Good  CLINICAL DECISION MAKING: Evolving/moderate complexity  EVALUATION COMPLEXITY: Moderate  PLAN:  PT FREQUENCY: 1-2x/week  PT DURATION: 12 weeks  PLANNED INTERVENTIONS: Therapeutic exercises, Therapeutic activity, Neuromuscular re-education, Balance  training, Gait training, Patient/Family education, Self Care, Joint mobilization, Stair training, Vestibular training, Canalith repositioning, DME instructions, Dry Needling, Electrical stimulation, Spinal manipulation, Spinal mobilization, Cryotherapy, Moist heat, Manual therapy, and Re-evaluation  PLAN FOR NEXT SESSION: Progress therex, bed mobility, and balance training. Continue to monitor c/o LBP and include DN and manual therapy.    Lenda Kelp, PT 09/09/2022, 8:12 AM

## 2022-09-13 ENCOUNTER — Ambulatory Visit: Payer: Medicare HMO

## 2022-09-13 ENCOUNTER — Telehealth: Payer: Self-pay

## 2022-09-13 NOTE — Telephone Encounter (Signed)
Pt did not show for scheduled appointment oin 7/15, author contacted pt via listed mobile number. Pt reports he misremembered the appointment time and was not aware of his 9:30a appointment this date. Pt made aware of next scheduled PT visit date and time.   9:53 AM, 09/13/22 Rosamaria Lints, PT, DPT Physical Therapist - [place] [facility]  Outpatient Physical Therapy- Main Campus [phone]

## 2022-09-15 ENCOUNTER — Ambulatory Visit: Payer: Medicare HMO

## 2022-09-15 DIAGNOSIS — R262 Difficulty in walking, not elsewhere classified: Secondary | ICD-10-CM | POA: Diagnosis not present

## 2022-09-15 DIAGNOSIS — R2689 Other abnormalities of gait and mobility: Secondary | ICD-10-CM

## 2022-09-15 DIAGNOSIS — R278 Other lack of coordination: Secondary | ICD-10-CM

## 2022-09-15 DIAGNOSIS — R2681 Unsteadiness on feet: Secondary | ICD-10-CM

## 2022-09-15 DIAGNOSIS — M6281 Muscle weakness (generalized): Secondary | ICD-10-CM

## 2022-09-15 DIAGNOSIS — R269 Unspecified abnormalities of gait and mobility: Secondary | ICD-10-CM

## 2022-09-15 NOTE — Therapy (Signed)
OUTPATIENT PHYSICAL THERAPY NEURO TREATMENT  Physical Therapy Progress Note   Dates of reporting period  08/09/22   to   09/15/22  Patient Name: LLIAM HOH MRN: [id number] DOB:September 12, 1945, 77 y.o., male Today's Date: 09/15/2022   PCP: Ardyth Man, PA- C REFERRING PROVIDER: Morene Crocker, MD  END OF SESSION:  PT End of Session - 09/15/22 1125     Visit Number 10    Number of Visits 24    Date for PT Re-Evaluation 11/01/22    Authorization Type [employer]    PT Start Time 1103    PT Stop Time 1143    PT Time Calculation (min) 40 min    Activity Tolerance Patient tolerated treatment well;No increased pain;Patient limited by pain    Behavior During Therapy [facility] for tasks assessed/performed               Past Medical History:  Diagnosis Date   Anxiety    Arteriosclerosis of coronary artery 11/16/2011   Overview:  Stent 10/2011 stent rca 2015 with collaterals to lad which is chronically occluded    Benign enlargement of prostate    Benign essential HTN 06/11/2014   Benign prostatic hypertrophy without urinary obstruction 07/31/2014   Bilateral cataracts 05/16/2013   Overview:  [facility]     Bone spur of foot    Left   BP (high blood pressure) 11/09/2012   Cancer (HCC)    skin (forehead) and bladder   Carotid artery narrowing 02/08/2014   Depression    Detrusor hypertrophy    Diabetes (HCC)    Diabetes mellitus, type 2 (HCC) 12/12/2012   Diverticulosis    Dyspnea    Esophageal reflux    Esophageal reflux    Fothergill's neuralgia 08/07/2012   Overview:  [facility]    Gastritis    GERD (gastroesophageal reflux disease)    Headache    cluster headaches   Healed myocardial infarct 11/09/2012   Hearing loss in left ear    Heart disease    Hematuria    Hemorrhoids    History of hiatal hernia 12/14/2017   small    Hypercholesteremia    Lesion of bladder    Myocardial infarct (HCC)    Presence of stent in coronary artery 11/09/2012    Rectal bleeding    Trigeminal neuralgia    Trigeminal neuralgia    Valvular heart disease    Vitamin D deficiency    Past Surgical History:  Procedure Laterality Date   APPENDECTOMY     BOTOX INJECTION N/A 12/21/2017   Procedure: Bladder BOTOX INJECTION;  Surgeon: Vanna Scotland, MD;  Location: [facility] ORS;  Service: Urology;  Laterality: N/A;   BOTOX INJECTION N/A 09/11/2018   Procedure: Bladder BOTOX INJECTION;  Surgeon: Vanna Scotland, MD;  Location: [facility] ORS;  Service: Urology;  Laterality: N/A;   CARDIAC CATHETERIZATION     CARDIAC CATHETERIZATION N/A 01/22/2015   Procedure: Left Heart Cath;  Surgeon: Lamar Blinks, MD;  Location: [facility] INVASIVE CV LAB;  Service: Cardiovascular;  Laterality: N/A;   CARDIAC CATHETERIZATION N/A 01/22/2015   Procedure: Coronary Stent Intervention;  Surgeon: Marcina Millard, MD;  Location: [facility] INVASIVE CV LAB;  Service: Cardiovascular;  Laterality: N/A;   CATARACT EXTRACTION, BILATERAL     COLONOSCOPY WITH PROPOFOL N/A 12/08/2015   Procedure: COLONOSCOPY WITH PROPOFOL;  Surgeon: Christena Deem, MD;  Location: [facility] ENDOSCOPY;  Service: Endoscopy;  Laterality: N/A;   COLONOSCOPY WITH PROPOFOL N/A 12/09/2015  Procedure: COLONOSCOPY WITH PROPOFOL;  Surgeon: Christena Deem, MD;  Location: [facility] ENDOSCOPY;  Service: Endoscopy;  Laterality: N/A;   CORONARY ANGIOPLASTY     5 stents   CORONARY STENT PLACEMENT  2015   x5   CYSTOSCOPY N/A 09/11/2018   Procedure: CYSTOSCOPY;  Surgeon: Vanna Scotland, MD;  Location: [facility] ORS;  Service: Urology;  Laterality: N/A;   CYSTOSCOPY WITH BIOPSY N/A 12/21/2017   Procedure: CYSTOSCOPY WITH BIOPSY;  Surgeon: Vanna Scotland, MD;  Location: [facility] ORS;  Service: Urology;  Laterality: N/A;   ESOPHAGOGASTRODUODENOSCOPY (EGD) WITH PROPOFOL N/A 12/08/2015   Procedure: ESOPHAGOGASTRODUODENOSCOPY (EGD) WITH PROPOFOL;  Surgeon: Christena Deem, MD;  Location: [facility] ENDOSCOPY;  Service: Endoscopy;  Laterality: N/A;    ESOPHAGOGASTRODUODENOSCOPY (EGD) WITH PROPOFOL N/A 12/13/2017   Procedure: ESOPHAGOGASTRODUODENOSCOPY (EGD) WITH PROPOFOL;  Surgeon: Christena Deem, MD;  Location: [facility] ENDOSCOPY;  Service: Endoscopy;  Laterality: N/A;   EYE SURGERY     HERNIA REPAIR     kidney tumor remove     TRANSURETHRAL RESECTION OF BLADDER TUMOR WITH GYRUS (TURBT-GYRUS)  12/2013   UMBILICAL HERNIA REPAIR     urethral meatotomy     Patient Active Problem List   Diagnosis Date Noted   Class 2 obesity due to excess calories with body mass index (BMI) of 36.0 to 36.9 in adult 04/11/2020   Swelling of limb 03/28/2020   Lymphedema 03/28/2020   Trigeminal neuralgia 12/25/2019   Lower limb ulcer, calf, left, limited to breakdown of skin (HCC) 12/25/2019   OSA (obstructive sleep apnea) 01/23/2019   Acute systolic CHF (congestive heart failure) (HCC) 12/12/2018   Bruising 07/10/2018   Diet-controlled type 2 diabetes mellitus (HCC) 03/24/2018   Microalbuminuria 03/24/2018   Dizziness 11/17/2017   Simple chronic bronchitis (HCC) 09/22/2017   SOBOE (shortness of breath on exertion) 02/18/2017   Chronic midline low back pain without sciatica 12/29/2016   Periodic limb movement disorder 12/29/2016   Benign essential tremor 06/22/2016   Pure hypercholesterolemia 06/20/2015   Erectile dysfunction due to arterial insufficiency 06/16/2015   Unstable angina (HCC) 01/22/2015   Abdominal aortic 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 rupture (HCC) 12/31/2014   Benign prostatic hypertrophy without urinary obstruction 07/31/2014   Enlarged prostate 07/31/2014   Benign essential HTN 06/11/2014   Carotid artery narrowing 02/08/2014   Carotid artery obstruction 02/08/2014   Bilateral carotid artery stenosis 02/08/2014   Chest pain 08/20/2013   Bilateral cataracts 05/16/2013   Cataract 05/16/2013   Clinical depression 12/12/2012   Diabetes mellitus, type 2 (HCC) 12/12/2012   Combined fat and carbohydrate induced hyperlipemia 12/12/2012    Major depressive disorder, single episode, unspecified 12/12/2012   Acid reflux 11/09/2012   Presence of stent in coronary artery 11/09/2012   BP (high blood pressure) 11/09/2012   Healed myocardial infarct 11/09/2012   Gastro-esophageal reflux disease without esophagitis 11/09/2012   History of cardiovascular surgery 11/09/2012   Fothergill's neuralgia 08/07/2012   Swelling of testicle 04/06/2012   Disorder of male genital organ 04/06/2012   Swelling of the testicles 04/06/2012   Arteriosclerosis of coronary artery 11/16/2011   CAD in native artery 11/16/2011   Fatigue 06/04/2011   Avitaminosis D 11/30/2010    ONSET DATE: 03/17/2022- Worsening symptoms  REFERRING DIAG:  G20.A1 (ICD-10-CM) - Parkinsons  R26.2 (ICD-10-CM) - Difficulty walking    THERAPY DIAG:  Difficulty in walking, not elsewhere classified  Muscle weakness (generalized)  Other lack of coordination  Abnormality of gait and mobility  Unsteadiness on feet  Other abnormalities of  gait and mobility  Rationale for Evaluation and Treatment: Rehabilitation  SUBJECTIVE:                                                                                                                                                                                             SUBJECTIVE STATEMENT: Patient reports 5/10  Pt accompanied by: significant other  PERTINENT HISTORY: Patient is a 77 year old male with referral to outpatient PT for Parkinsons and difficulty walking. He was recently seen by Neurology worsening symptoms of parkinson- difficulty with walking. He has past medical history of Arthritis, Bladder cancer, andTrigeminal Neuralgia.  PAIN:  Are you having pain? Yes 5/10 low back   PRECAUTIONS: Fall  WEIGHT BEARING RESTRICTIONS: No  FALLS: Has patient fallen in last 6 months? Yes. Number of falls 1  LIVING ENVIRONMENT: Lives with: lives with their spouse Lives in: House/apartment Stairs: Yes: External: 2 steps;  has grab bar Has following equipment at home: Dan Humphreys - 2 wheeled, Environmental consultant - 4 wheeled, and Grab bars  PLOF: Independent with household mobility with device, Independent with community mobility with device, Independent with transfers, and Requires assistive device for independence  PATIENT GOALS: Get to where I can walk better again and get my legs stronger.   OBJECTIVE:    TODAY'S TREATMENT:                                                                                                                              DATE: 09/15/22   -Overground AMB x3.5 laps, upwalker, consistent pacing, no LOB; fatigue stop 4 minutes 40 seconds -FOTO survey  59 today (44 at evaluation)  -5xSTS 13.98sec  -TUG: 19.5sec upwalker  -STS x12 with BUE overhead PWR move -lateral side stepping at plinth 6x bilat -seated in chair, trunk rotation stretch 3x30sec bilat      PATIENT EDUCATION: Education details: Plan of care; purpose of functional outcome measures.  Person educated: Patient Education method: Explanation, Demonstration, Tactile cues, Verbal cues, and Handouts Education comprehension: verbalized understanding, returned demonstration, verbal cues required, tactile cues required, and needs  further education  HOME EXERCISE PROGRAM: Access Code: [email or web address] URL: [email or web address] Date: 08/11/2022 Prepared by: Maureen Ralphs  Exercises - Seated Lumbar Flexion Stretch  - 7 x weekly - 3 sets - 30 sec hold - Seated Thoracic Flexion and Rotation with Arms Crossed  - 1 x daily - 7 x weekly - 3 sets - 30 sec hold - Seated Figure 4 Piriformis Stretch  - 1 x daily - 7 x weekly - 3 sets - 10 reps  GOALS: Goals reviewed with patient? Yes  SHORT TERM GOALS: Target date: 09/20/2022  Pt will be independent with a comprehensive HEP in order to improve strength and balance in order to decrease fall risk and improve function at home and work.  Baseline: EVAL: Patient admits not performing  much exercises - not moving around well.  Goal status: INITIAL   LONG TERM GOALS: Target date: 11/01/2022  Pt will improve FOTO to target score of 45 % to display perceived improvements in ability to complete ADL's.  Baseline: EVAL: 42 Goal status: INITIAL  2.  Pt will decrease 5TSTS by at least 5 seconds in order to demonstrate clinically significant improvement in LE strength.  Baseline: EVAL = 17.39 sec without UE support Goal status: INITIAL  3. Pt will increase by at least 0.15 m/s in order to demonstrate clinically significant improvement in community ambulation   Baseline: EVAL= 0.67 m/s Goal status: INITIAL  4.  Pt will improve BERG by at least 3 points in order to demonstrate clinically significant improvement in balance.   Baseline: EVAL: to be assessed next visit: 08/23/2022= 36/56 Goal status: INITIAL  5.  Pt will decrease TUG to below 14 seconds/decrease in order to demonstrate decreased fall risk. Baseline: EVAL: 22.00 sec with upright 4WW Goal status: INITIAL  6.  Pt will increase by at least 39m (171ft) in order to demonstrate clinically significant improvement in cardiopulmonary endurance and community ambulation  Baseline: EVAL: 3:45 min (450 feet with upright 4WW) Goal status: INITIAL 7.   Patient will report returning to walking in to community places using most appropriate assistive device vs current use of scooter for improved abilities with mobility during community outings Baseline: EVAL: Current using scooter for many social/community outings  Goal status: INITIAL ASSESSMENT:  CLINICAL IMPRESSION: Encouraged him to remain active and continue with daily ROM activities.  PT will continue to benefit from skilled PT services to improve his overall strength and mobility to maximize his independent function while decreasing his fall risk.   OBJECTIVE IMPAIRMENTS: Abnormal gait, cardiopulmonary status limiting activity, decreased activity tolerance,  decreased balance, decreased coordination, decreased endurance, decreased mobility, difficulty walking, decreased ROM, decreased strength, hypomobility, impaired flexibility, impaired sensation, impaired UE functional use, postural dysfunction, obesity, and pain.   ACTIVITY LIMITATIONS: carrying, lifting, bending, standing, squatting, stairs, transfers, continence, and reach over head  PARTICIPATION LIMITATIONS: meal prep, cleaning, laundry, driving, shopping, community activity, and yard work  PERSONAL FACTORS: Age and 3+ comorbidities: HTN, Arthritis, Low back pain, Trigeminal Neuralgia  are also affecting patient's functional outcome.   REHAB POTENTIAL: Good  CLINICAL DECISION MAKING: Evolving/moderate complexity  EVALUATION COMPLEXITY: Moderate  PLAN:  PT FREQUENCY: 1-2x/week  PT DURATION: 12 weeks  PLANNED INTERVENTIONS: Therapeutic exercises, Therapeutic activity, Neuromuscular re-education, Balance training, Gait training, Patient/Family education, Self Care, Joint mobilization, Stair training, Vestibular training, Canalith repositioning, DME instructions, Dry Needling, Electrical stimulation, Spinal manipulation, Spinal mobilization, Cryotherapy, Moist heat, Manual therapy, and Re-evaluation  PLAN FOR NEXT SESSION: Progress therex, bed mobility,  and balance training.    Makella Buckingham C, PT 09/15/2022, 11:47 AM  11:47 AM, 09/15/22 Rosamaria Lints, PT, DPT Physical Therapist - [facility]  Outpatient Physical Therapy- Main Campus [phone]

## 2022-09-17 ENCOUNTER — Ambulatory Visit: Payer: Medicare HMO

## 2022-09-20 ENCOUNTER — Ambulatory Visit: Payer: Medicare HMO

## 2022-09-21 ENCOUNTER — Ambulatory Visit: Payer: Medicare HMO

## 2022-09-21 DIAGNOSIS — R262 Difficulty in walking, not elsewhere classified: Secondary | ICD-10-CM | POA: Diagnosis not present

## 2022-09-21 DIAGNOSIS — G8929 Other chronic pain: Secondary | ICD-10-CM

## 2022-09-21 DIAGNOSIS — R278 Other lack of coordination: Secondary | ICD-10-CM

## 2022-09-21 DIAGNOSIS — R2689 Other abnormalities of gait and mobility: Secondary | ICD-10-CM

## 2022-09-21 DIAGNOSIS — R2681 Unsteadiness on feet: Secondary | ICD-10-CM

## 2022-09-21 DIAGNOSIS — R269 Unspecified abnormalities of gait and mobility: Secondary | ICD-10-CM

## 2022-09-21 DIAGNOSIS — M6281 Muscle weakness (generalized): Secondary | ICD-10-CM

## 2022-09-21 DIAGNOSIS — M545 Low back pain, unspecified: Secondary | ICD-10-CM

## 2022-09-21 NOTE — Therapy (Signed)
OUTPATIENT PHYSICAL THERAPY NEURO TREATMENT    Patient Name: MARQUEZE RAMCHARAN MRN: [id number] DOB:09-Jul-1945, 77 y.o., male Today's Date: 09/21/2022   PCP: Ardyth Man, PA- C REFERRING PROVIDER: Morene Crocker, MD  END OF SESSION:  PT End of Session - 09/21/22 1405     Visit Number 11    Number of Visits 24    Date for PT Re-Evaluation 11/01/22    Authorization Type [employer]    PT Start Time 1400    PT Stop Time 1433    PT Time Calculation (min) 33 min    Equipment Utilized During Treatment Gait belt    Activity Tolerance Patient tolerated treatment well;No increased pain;Patient limited by pain    Behavior During Therapy [facility] for tasks assessed/performed               Past Medical History:  Diagnosis Date   Anxiety    Arteriosclerosis of coronary artery 11/16/2011   Overview:  Stent 10/2011 stent rca 2015 with collaterals to lad which is chronically occluded    Benign enlargement of prostate    Benign essential HTN 06/11/2014   Benign prostatic hypertrophy without urinary obstruction 07/31/2014   Bilateral cataracts 05/16/2013   Overview:  [facility]     Bone spur of foot    Left   BP (high blood pressure) 11/09/2012   Cancer (HCC)    skin (forehead) and bladder   Carotid artery narrowing 02/08/2014   Depression    Detrusor hypertrophy    Diabetes (HCC)    Diabetes mellitus, type 2 (HCC) 12/12/2012   Diverticulosis    Dyspnea    Esophageal reflux    Esophageal reflux    Fothergill's neuralgia 08/07/2012   Overview:  [facility] Neurology    Gastritis    GERD (gastroesophageal reflux disease)    Headache    cluster headaches   Healed myocardial infarct 11/09/2012   Hearing loss in left ear    Heart disease    Hematuria    Hemorrhoids    History of hiatal hernia 12/14/2017   small    Hypercholesteremia    Lesion of bladder    Myocardial infarct (HCC)    Presence of stent in coronary artery 11/09/2012   Rectal bleeding    Trigeminal  neuralgia    Trigeminal neuralgia    Valvular heart disease    Vitamin D deficiency    Past Surgical History:  Procedure Laterality Date   APPENDECTOMY     BOTOX INJECTION N/A 12/21/2017   Procedure: Bladder BOTOX INJECTION;  Surgeon: Vanna Scotland, MD;  Location: [facility] ORS;  Service: Urology;  Laterality: N/A;   BOTOX INJECTION N/A 09/11/2018   Procedure: Bladder BOTOX INJECTION;  Surgeon: Vanna Scotland, MD;  Location: [facility] ORS;  Service: Urology;  Laterality: N/A;   CARDIAC CATHETERIZATION     CARDIAC CATHETERIZATION N/A 01/22/2015   Procedure: Left Heart Cath;  Surgeon: Lamar Blinks, MD;  Location: [facility] INVASIVE CV LAB;  Service: Cardiovascular;  Laterality: N/A;   CARDIAC CATHETERIZATION N/A 01/22/2015   Procedure: Coronary Stent Intervention;  Surgeon: Marcina Millard, MD;  Location: [facility] INVASIVE CV LAB;  Service: Cardiovascular;  Laterality: N/A;   CATARACT EXTRACTION, BILATERAL     COLONOSCOPY WITH PROPOFOL N/A 12/08/2015   Procedure: COLONOSCOPY WITH PROPOFOL;  Surgeon: Christena Deem, MD;  Location: [facility] ENDOSCOPY;  Service: Endoscopy;  Laterality: N/A;   COLONOSCOPY WITH PROPOFOL N/A 12/09/2015   Procedure: COLONOSCOPY WITH PROPOFOL;  Surgeon: Cindra Eves  Marva Panda, MD;  Location: [facility] ENDOSCOPY;  Service: Endoscopy;  Laterality: N/A;   CORONARY ANGIOPLASTY     5 stents   CORONARY STENT PLACEMENT  2015   x5   CYSTOSCOPY N/A 09/11/2018   Procedure: CYSTOSCOPY;  Surgeon: Vanna Scotland, MD;  Location: [facility] ORS;  Service: Urology;  Laterality: N/A;   CYSTOSCOPY WITH BIOPSY N/A 12/21/2017   Procedure: CYSTOSCOPY WITH BIOPSY;  Surgeon: Vanna Scotland, MD;  Location: [facility] ORS;  Service: Urology;  Laterality: N/A;   ESOPHAGOGASTRODUODENOSCOPY (EGD) WITH PROPOFOL N/A 12/08/2015   Procedure: ESOPHAGOGASTRODUODENOSCOPY (EGD) WITH PROPOFOL;  Surgeon: Christena Deem, MD;  Location: [facility] ENDOSCOPY;  Service: Endoscopy;  Laterality: N/A;   ESOPHAGOGASTRODUODENOSCOPY (EGD) WITH  PROPOFOL N/A 12/13/2017   Procedure: ESOPHAGOGASTRODUODENOSCOPY (EGD) WITH PROPOFOL;  Surgeon: Christena Deem, MD;  Location: [facility] ENDOSCOPY;  Service: Endoscopy;  Laterality: N/A;   EYE SURGERY     HERNIA REPAIR     kidney tumor remove     TRANSURETHRAL RESECTION OF BLADDER TUMOR WITH GYRUS (TURBT-GYRUS)  12/2013   UMBILICAL HERNIA REPAIR     urethral meatotomy     Patient Active Problem List   Diagnosis Date Noted   Class 2 obesity due to excess calories with body mass index (BMI) of 36.0 to 36.9 in adult 04/11/2020   Swelling of limb 03/28/2020   Lymphedema 03/28/2020   Trigeminal neuralgia 12/25/2019   Lower limb ulcer, calf, left, limited to breakdown of skin (HCC) 12/25/2019   OSA (obstructive sleep apnea) 01/23/2019   Acute systolic CHF (congestive heart failure) (HCC) 12/12/2018   Bruising 07/10/2018   Diet-controlled type 2 diabetes mellitus (HCC) 03/24/2018   Microalbuminuria 03/24/2018   Dizziness 11/17/2017   Simple chronic bronchitis (HCC) 09/22/2017   SOBOE (shortness of breath on exertion) 02/18/2017   Chronic midline low back pain without sciatica 12/29/2016   Periodic limb movement disorder 12/29/2016   Benign essential tremor 06/22/2016   Pure hypercholesterolemia 06/20/2015   Erectile dysfunction due to arterial insufficiency 06/16/2015   Unstable angina (HCC) 01/22/2015   Abdominal aortic 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 rupture (HCC) 12/31/2014   Benign prostatic hypertrophy without urinary obstruction 07/31/2014   Enlarged prostate 07/31/2014   Benign essential HTN 06/11/2014   Carotid artery narrowing 02/08/2014   Carotid artery obstruction 02/08/2014   Bilateral carotid artery stenosis 02/08/2014   Chest pain 08/20/2013   Bilateral cataracts 05/16/2013   Cataract 05/16/2013   Clinical depression 12/12/2012   Diabetes mellitus, type 2 (HCC) 12/12/2012   Combined fat and carbohydrate induced hyperlipemia 12/12/2012   Major depressive disorder, single  episode, unspecified 12/12/2012   Acid reflux 11/09/2012   Presence of stent in coronary artery 11/09/2012   BP (high blood pressure) 11/09/2012   Healed myocardial infarct 11/09/2012   Gastro-esophageal reflux disease without esophagitis 11/09/2012   History of cardiovascular surgery 11/09/2012   Fothergill's neuralgia 08/07/2012   Swelling of testicle 04/06/2012   Disorder of male genital organ 04/06/2012   Swelling of the testicles 04/06/2012   Arteriosclerosis of coronary artery 11/16/2011   CAD in native artery 11/16/2011   Fatigue 06/04/2011   Avitaminosis D 11/30/2010    ONSET DATE: 03/17/2022- Worsening symptoms  REFERRING DIAG:  G20.A1 (ICD-10-CM) - Parkinsons  R26.2 (ICD-10-CM) - Difficulty walking    THERAPY DIAG:  Difficulty in walking, not elsewhere classified  Muscle weakness (generalized)  Other lack of coordination  Abnormality of gait and mobility  Unsteadiness on feet  Other abnormalities of gait and mobility  Chronic bilateral low back  pain without sciatica  Rationale for Evaluation and Treatment: Rehabilitation  SUBJECTIVE:                                                                                                                                                                                             SUBJECTIVE STATEMENT: Patient reports he is pretty sure he is getting a UTI- going to MD tomorrow morning- Reports difficulty and painful urination   Patient reports 0/10  Pt accompanied by: significant other  PERTINENT HISTORY: Patient is a 77 year old male with referral to outpatient PT for Parkinsons and difficulty walking. He was recently seen by Neurology worsening symptoms of parkinson- difficulty with walking. He has past medical history of Arthritis, Bladder cancer, andTrigeminal Neuralgia.  PAIN:  Are you having pain? Yes 5/10 low back   PRECAUTIONS: Fall  WEIGHT BEARING RESTRICTIONS: No  FALLS: Has patient fallen in last 6  months? Yes. Number of falls 1  LIVING ENVIRONMENT: Lives with: lives with their spouse Lives in: House/apartment Stairs: Yes: External: 2 steps; has grab bar Has following equipment at home: Dan Humphreys - 2 wheeled, Environmental consultant - 4 wheeled, and Grab bars  PLOF: Independent with household mobility with device, Independent with community mobility with device, Independent with transfers, and Requires assistive device for independence  PATIENT GOALS: Get to where I can walk better again and get my legs stronger.   OBJECTIVE:    TODAY'S TREATMENT:                                                                                                                              DATE: 09/21/22   - Seated therex: Performed due to complaint of not feeling well but did not want to miss PT.   Hip march 3# AW 2 sets of 12 reps (VC for step height)  Seated knee ext 3# AW - 2 sets of 12 reps (VC to terminally extend knee)  Seated ham curls RTB- 2 sets of 12 reps Seated hip abd with RTB- 2 sets of 12 reps Sit to stand x 10  reps without UE Support (no dizziness yet slow to complete)  Seated calf raises (toe box on 1/2 foam roll) 2 sets of 12 reps      PATIENT EDUCATION: Education details: Plan of care; purpose of functional outcome measures.  Person educated: Patient Education method: Explanation, Demonstration, Tactile cues, Verbal cues, and Handouts Education comprehension: verbalized understanding, returned demonstration, verbal cues required, tactile cues required, and needs further education  HOME EXERCISE PROGRAM: Access Code: [email or web address] URL: [email or web address] Date: 08/11/2022 Prepared by: Maureen Ralphs  Exercises - Seated Lumbar Flexion Stretch  - 7 x weekly - 3 sets - 30 sec hold - Seated Thoracic Flexion and Rotation with Arms Crossed  - 1 x daily - 7 x weekly - 3 sets - 30 sec hold - Seated Figure 4 Piriformis Stretch  - 1 x daily - 7 x weekly - 3 sets - 10  reps  GOALS: Goals reviewed with patient? Yes  SHORT TERM GOALS: Target date: 09/20/2022  Pt will be independent with a comprehensive HEP in order to improve strength and balance in order to decrease fall risk and improve function at home and work.  Baseline: EVAL: Patient admits not performing much exercises - not moving around well.  Goal status: INITIAL   LONG TERM GOALS: Target date: 11/01/2022  Pt will improve FOTO to target score of 45 % to display perceived improvements in ability to complete ADL's.  Baseline: EVAL: 42 Goal status: INITIAL  2.  Pt will decrease 5TSTS by at least 5 seconds in order to demonstrate clinically significant improvement in LE strength.  Baseline: EVAL = 17.39 sec without UE support Goal status: INITIAL  3. Pt will increase by at least 0.15 m/s in order to demonstrate clinically significant improvement in community ambulation   Baseline: EVAL= 0.67 m/s Goal status: INITIAL  4.  Pt will improve BERG by at least 3 points in order to demonstrate clinically significant improvement in balance.   Baseline: EVAL: to be assessed next visit: 08/23/2022= 36/56 Goal status: INITIAL  5.  Pt will decrease TUG to below 14 seconds/decrease in order to demonstrate decreased fall risk. Baseline: EVAL: 22.00 sec with upright 4WW Goal status: INITIAL  6.  Pt will increase by at least 68m (122ft) in order to demonstrate clinically significant improvement in cardiopulmonary endurance and community ambulation  Baseline: EVAL: 3:45 min (450 feet with upright 4WW) Goal status: INITIAL 7.   Patient will report returning to walking in to community places using most appropriate assistive device vs current use of scooter for improved abilities with mobility during community outings Baseline: EVAL: Current using scooter for many social/community outings  Goal status: INITIAL ASSESSMENT:  CLINICAL IMPRESSION: Treatment limited to seated therex as patient reported  not feeling well today. He was able to complete all therex without significant issues- no pain, dizziness or any worsening symptoms. He was able to complete most therex with minimal rest. PT will continue to benefit from skilled PT services to improve his overall strength and mobility to maximize his independent function while decreasing his fall risk.   OBJECTIVE IMPAIRMENTS: Abnormal gait, cardiopulmonary status limiting activity, decreased activity tolerance, decreased balance, decreased coordination, decreased endurance, decreased mobility, difficulty walking, decreased ROM, decreased strength, hypomobility, impaired flexibility, impaired sensation, impaired UE functional use, postural dysfunction, obesity, and pain.   ACTIVITY LIMITATIONS: carrying, lifting, bending, standing, squatting, stairs, transfers, continence, and reach over head  PARTICIPATION LIMITATIONS: meal prep, cleaning, laundry, driving, shopping, community activity, and yard work  PERSONAL FACTORS: Age and 3+ comorbidities: HTN, Arthritis, Low back pain, Trigeminal Neuralgia  are also affecting patient's functional outcome.   REHAB POTENTIAL: Good  CLINICAL DECISION MAKING: Evolving/moderate complexity  EVALUATION COMPLEXITY: Moderate  PLAN:  PT FREQUENCY: 1-2x/week  PT DURATION: 12 weeks  PLANNED INTERVENTIONS: Therapeutic exercises, Therapeutic activity, Neuromuscular re-education, Balance training, Gait training, Patient/Family education, Self Care, Joint mobilization, Stair training, Vestibular training, Canalith repositioning, DME instructions, Dry Needling, Electrical stimulation, Spinal manipulation, Spinal mobilization, Cryotherapy, Moist heat, Manual therapy, and Re-evaluation  PLAN FOR NEXT SESSION: Progress therex, bed mobility, and balance training.    Lenda Kelp, PT 09/21/2022, 2:39 PM  2:39 PM, 09/21/22  Physical Therapist - [facility]  Outpatient  Physical Therapy- Main Campus [phone]

## 2022-09-22 ENCOUNTER — Ambulatory Visit: Payer: Medicare HMO | Admitting: Urology

## 2022-09-22 VITALS — BP 117/71 | HR 68 | Ht 67.0 in | Wt 223.0 lb

## 2022-09-22 DIAGNOSIS — Z8551 Personal history of malignant neoplasm of bladder: Secondary | ICD-10-CM | POA: Diagnosis not present

## 2022-09-22 DIAGNOSIS — N3001 Acute cystitis with hematuria: Secondary | ICD-10-CM

## 2022-09-22 DIAGNOSIS — R300 Dysuria: Secondary | ICD-10-CM

## 2022-09-22 LAB — MICROSCOPIC EXAMINATION: RBC, Urine: >30 /HPF — AB (ref 0–2)

## 2022-09-22 LAB — URINALYSIS, COMPLETE
Bilirubin, UA: NEGATIVE
Glucose, UA: NEGATIVE
Ketones, UA: NEGATIVE
Nitrite, UA: NEGATIVE
Specific Gravity, UA: 1.02 (ref 1.005–1.030)
Urobilinogen, Ur: 0.2 mg/dL (ref 0.2–1.0)
pH, UA: 5.5 (ref 5.0–7.5)

## 2022-09-22 MED ORDER — SULFAMETHOXAZOLE-TRIMETHOPRIM 800-160 MG PO TABS
1.0000 | ORAL_TABLET | Freq: Two times a day (BID) | ORAL | 0 refills | Status: AC
Start: 2022-09-22 — End: ?

## 2022-09-22 NOTE — Progress Notes (Signed)
Marcelle Overlie Plume,acting as a scribe for Vanna Scotland, MD.,have documented all relevant documentation on the behalf of Vanna Scotland, MD,as directed by  Vanna Scotland, MD while in the presence of Vanna Scotland, MD.  09/22/2022 11:01 AM   [name] 1945/09/07 [id number]  Referring provider: Ardyth Man, PA-C [address] [place],  [place] [address]  Chief Complaint  Patient presents with   burning with urination    HPI: 77 year-old male with a personal history of OAB and bladder cancer who presents today with an acute UTI visit. He reports that his whole bladder has been burning along with urgency, frequency, and lower abdominal pain.   His urinalysis today shows 11-30 WBC, >30 RBC and moderate bacteria.  These symptoms began two days ago. He describes significant discomfort when he needs to urinate, stating that his entire bladder hurts, not just the urinary tract. He also mentions experiencing back pain, although it is unclear if this is related to his current symptoms. He denies having fevers or chills.   PMH: Past Medical History:  Diagnosis Date   Anxiety    Arteriosclerosis of coronary artery 11/16/2011   Overview:  Stent 10/2011 stent rca 2015 with collaterals to lad which is chronically occluded    Benign enlargement of prostate    Benign essential HTN 06/11/2014   Benign prostatic hypertrophy without urinary obstruction 07/31/2014   Bilateral cataracts 05/16/2013   Overview:  [facility]     Bone spur of foot    Left   BP (high blood pressure) 11/09/2012   Cancer (HCC)    skin (forehead) and bladder   Carotid artery narrowing 02/08/2014   Depression    Detrusor hypertrophy    Diabetes (HCC)    Diabetes mellitus, type 2 (HCC) 12/12/2012   Diverticulosis    Dyspnea    Esophageal reflux    Esophageal reflux    Fothergill's neuralgia 08/07/2012   Overview:  [facility] Neurology    Gastritis    GERD (gastroesophageal reflux disease)    Headache     cluster headaches   Healed myocardial infarct 11/09/2012   Hearing loss in left ear    Heart disease    Hematuria    Hemorrhoids    History of hiatal hernia 12/14/2017   small    Hypercholesteremia    Lesion of bladder    Myocardial infarct (HCC)    Presence of stent in coronary artery 11/09/2012   Rectal bleeding    Trigeminal neuralgia    Trigeminal neuralgia    Valvular heart disease    Vitamin D deficiency     Surgical History: Past Surgical History:  Procedure Laterality Date   APPENDECTOMY     BOTOX INJECTION N/A 12/21/2017   Procedure: Bladder BOTOX INJECTION;  Surgeon: Vanna Scotland, MD;  Location: [facility] ORS;  Service: Urology;  Laterality: N/A;   BOTOX INJECTION N/A 09/11/2018   Procedure: Bladder BOTOX INJECTION;  Surgeon: Vanna Scotland, MD;  Location: [facility] ORS;  Service: Urology;  Laterality: N/A;   CARDIAC CATHETERIZATION     CARDIAC CATHETERIZATION N/A 01/22/2015   Procedure: Left Heart Cath;  Surgeon: Lamar Blinks, MD;  Location: [facility] INVASIVE CV LAB;  Service: Cardiovascular;  Laterality: N/A;   CARDIAC CATHETERIZATION N/A 01/22/2015   Procedure: Coronary Stent Intervention;  Surgeon: Marcina Millard, MD;  Location: [facility] INVASIVE CV LAB;  Service: Cardiovascular;  Laterality: N/A;   CATARACT EXTRACTION, BILATERAL     COLONOSCOPY WITH PROPOFOL N/A 12/08/2015  Procedure: COLONOSCOPY WITH PROPOFOL;  Surgeon: Christena Deem, MD;  Location: [facility] ENDOSCOPY;  Service: Endoscopy;  Laterality: N/A;   COLONOSCOPY WITH PROPOFOL N/A 12/09/2015   Procedure: COLONOSCOPY WITH PROPOFOL;  Surgeon: Christena Deem, MD;  Location: [facility] ENDOSCOPY;  Service: Endoscopy;  Laterality: N/A;   CORONARY ANGIOPLASTY     5 stents   CORONARY STENT PLACEMENT  2015   x5   CYSTOSCOPY N/A 09/11/2018   Procedure: CYSTOSCOPY;  Surgeon: Vanna Scotland, MD;  Location: [facility] ORS;  Service: Urology;  Laterality: N/A;   CYSTOSCOPY WITH BIOPSY N/A 12/21/2017   Procedure: CYSTOSCOPY WITH  BIOPSY;  Surgeon: Vanna Scotland, MD;  Location: [facility] ORS;  Service: Urology;  Laterality: N/A;   ESOPHAGOGASTRODUODENOSCOPY (EGD) WITH PROPOFOL N/A 12/08/2015   Procedure: ESOPHAGOGASTRODUODENOSCOPY (EGD) WITH PROPOFOL;  Surgeon: Christena Deem, MD;  Location: [facility] ENDOSCOPY;  Service: Endoscopy;  Laterality: N/A;   ESOPHAGOGASTRODUODENOSCOPY (EGD) WITH PROPOFOL N/A 12/13/2017   Procedure: ESOPHAGOGASTRODUODENOSCOPY (EGD) WITH PROPOFOL;  Surgeon: Christena Deem, MD;  Location: [facility] ENDOSCOPY;  Service: Endoscopy;  Laterality: N/A;   EYE SURGERY     HERNIA REPAIR     kidney tumor remove     TRANSURETHRAL RESECTION OF BLADDER TUMOR WITH GYRUS (TURBT-GYRUS)  12/2013   UMBILICAL HERNIA REPAIR     urethral meatotomy      Home Medications:  Allergies as of 09/22/2022       Reactions   Ace Inhibitors Cough   Carbamazepine Rash   Other reaction(s): RASH Other reaction(s): RASH   Lyrica [pregabalin] Rash        Medication List        Accurate as of September 22, 2022 11:01 AM. If you have any questions, ask your nurse or doctor.          albuterol 108 (90 Base) MCG/ACT inhaler Commonly known as: VENTOLIN HFA   aspirin 81 MG tablet Take 81 mg by mouth daily.   carbidopa-levodopa 25-100 MG tablet Commonly known as: SINEMET IR Take 2 tablets by mouth 3 (three) times daily.   Cholecalciferol 25 MCG (1000 UT) tablet Take 1,000 Units by mouth daily.   citalopram 20 MG tablet Commonly known as: CELEXA Take 20 mg by mouth at bedtime.   clonazePAM 0.5 MG tablet Commonly known as: KLONOPIN PLEASE SEE ATTACHED FOR DETAILED DIRECTIONS   Docusate Sodium 100 MG capsule Take by mouth.   furosemide 80 MG tablet Commonly known as: LASIX TAKE 1 TABLET (80 MG TOTAL) BY MOUTH ONCE DAILY.   hydrocortisone 2.5 % cream   hydrocortisone 2.5 % cream Apply topically 2 (two) times daily as needed (Rash). Use as directed in printout   isosorbide mononitrate 30 MG 24 hr  tablet Commonly known as: IMDUR Take 60 mg by mouth daily.   ketoconazole 2 % cream Commonly known as: NIZORAL   ketoconazole 2 % cream Commonly known as: NIZORAL Apply 1 Application topically 2 (two) times daily. Use as directed in print out   ketoconazole 2 % shampoo Commonly known as: NIZORAL apply three times per week, massage into scalp and leave in for 10 minutes before rinsing out. Use as directed in printout   lidocaine 5 % Commonly known as: LIDODERM APPLY 1 PATCH BY TOPICAL ROUTE ONCE DAILY (MAY WEAR UP TO 12HOURS.)   meloxicam 15 MG tablet Commonly known as: MOBIC Take 15 mg by mouth daily.   methocarbamol 750 MG tablet Commonly known as: ROBAXIN Take 750 mg by mouth every 8 (eight) hours.  multivitamins with iron Tabs tablet Take 1 tablet by mouth daily.   nitroGLYCERIN 0.4 MG SL tablet Commonly known as: NITROSTAT Place under the tongue.   ONE TOUCH ULTRA TEST test strip Generic drug: glucose blood USE 3 (THREE) TIMES DAILY USE AS INSTRUCTED.- ONE TOUCH ULTRA BLOOD GLUCOSE METER STRIPS   OXcarbazepine 150 MG tablet Commonly known as: TRILEPTAL Take 150 mg by mouth at bedtime.   pantoprazole 40 MG tablet Commonly known as: PROTONIX Take 40 mg by mouth daily.   pramipexole 1 MG tablet Commonly known as: MIRAPEX Take 1 mg by mouth daily.   RA Blood Glucose Monitor Devi by Does not apply route.   rosuvastatin 20 MG tablet Commonly known as: CRESTOR Take by mouth.   sulfamethoxazole-trimethoprim 800-160 MG tablet Commonly known as: BACTRIM DS Take 1 tablet by mouth 2 (two) times daily.   terbinafine 250 MG tablet Commonly known as: LAMISIL Take by mouth.   valsartan 40 MG tablet Commonly known as: DIOVAN Take 40 mg by mouth daily.   Vibegron 75 MG Tabs Take 75 mg by mouth daily.        Allergies:  Allergies  Allergen Reactions   Ace Inhibitors Cough   Carbamazepine Rash    Other reaction(s): RASH Other reaction(s): RASH     Lyrica [Pregabalin] Rash    Family History: Family History  Problem Relation Age of Onset   Kidney cancer Mother    Prostate cancer Neg Hx     Social History:  reports that he quit smoking about 33 years ago. His smoking use included cigarettes. He started smoking about 73 years ago. He has a 80 pack-year smoking history. He has never used smokeless tobacco. He reports that he does not currently use alcohol. He reports that he does not use drugs.   Physical Exam: BP 117/71   Pulse 68   Ht 5\' 7"  (1.702 m)   Wt 223 lb (101.2 kg)   BMI 34.93 kg/m   Constitutional:  Alert and oriented, No acute distress. HEENT: [place] AT, moist mucus membranes.  Trachea midline, no masses. Neurologic: Grossly intact, no focal deficits, moving all 4 extremities. Psychiatric: Normal mood and affect.   Assessment & Plan:    1. Acute cystitis - Bactrim DS, BID x 7 dys - Sent a urine culture today  2. History of bladder cancer - We will push out his cystoscopy another week or two to allow for infection to resolve and to reassess his urinary symptoms at the time  Return for cystoscopy.  I have reviewed the above documentation for accuracy and completeness, and I agree with the above.   Vanna Scotland, MD   [facility] [address] [place], [place] [address] [phone]

## 2022-09-22 NOTE — Therapy (Signed)
OUTPATIENT PHYSICAL THERAPY NEURO TREATMENT    Patient Name: [name] MRN: [id number] DOB:19-May-1945, 77 y.o., male Today's Date: 09/23/2022   PCP: Ardyth Man, PA- C REFERRING PROVIDER: Morene Crocker, MD  END OF SESSION:  PT End of Session - 09/23/22 0930     Visit Number 12    Number of Visits 24    Date for PT Re-Evaluation 11/01/22    Authorization Type [employer]    PT Start Time 0930    PT Stop Time 1014    PT Time Calculation (min) 44 min    Equipment Utilized During Treatment Gait belt    Activity Tolerance Patient tolerated treatment well;No increased pain;Patient limited by pain    Behavior During Therapy [facility] for tasks assessed/performed                Past Medical History:  Diagnosis Date   Anxiety    Arteriosclerosis of coronary artery 11/16/2011   Overview:  Stent 10/2011 stent rca 2015 with collaterals to lad which is chronically occluded    Benign enlargement of prostate    Benign essential HTN 06/11/2014   Benign prostatic hypertrophy without urinary obstruction 07/31/2014   Bilateral cataracts 05/16/2013   Overview:  [facility]     Bone spur of foot    Left   BP (high blood pressure) 11/09/2012   Cancer (HCC)    skin (forehead) and bladder   Carotid artery narrowing 02/08/2014   Depression    Detrusor hypertrophy    Diabetes (HCC)    Diabetes mellitus, type 2 (HCC) 12/12/2012   Diverticulosis    Dyspnea    Esophageal reflux    Esophageal reflux    Fothergill's neuralgia 08/07/2012   Overview:  [facility] Neurology    Gastritis    GERD (gastroesophageal reflux disease)    Headache    cluster headaches   Healed myocardial infarct 11/09/2012   Hearing loss in left ear    Heart disease    Hematuria    Hemorrhoids    History of hiatal hernia 12/14/2017   small    Hypercholesteremia    Lesion of bladder    Myocardial infarct (HCC)    Presence of stent in coronary artery 11/09/2012   Rectal bleeding    Trigeminal  neuralgia    Trigeminal neuralgia    Valvular heart disease    Vitamin D deficiency    Past Surgical History:  Procedure Laterality Date   APPENDECTOMY     BOTOX INJECTION N/A 12/21/2017   Procedure: Bladder BOTOX INJECTION;  Surgeon: Vanna Scotland, MD;  Location: [facility] ORS;  Service: Urology;  Laterality: N/A;   BOTOX INJECTION N/A 09/11/2018   Procedure: Bladder BOTOX INJECTION;  Surgeon: Vanna Scotland, MD;  Location: [facility] ORS;  Service: Urology;  Laterality: N/A;   CARDIAC CATHETERIZATION     CARDIAC CATHETERIZATION N/A 01/22/2015   Procedure: Left Heart Cath;  Surgeon: Lamar Blinks, MD;  Location: [facility] INVASIVE CV LAB;  Service: Cardiovascular;  Laterality: N/A;   CARDIAC CATHETERIZATION N/A 01/22/2015   Procedure: Coronary Stent Intervention;  Surgeon: Marcina Millard, MD;  Location: [facility] INVASIVE CV LAB;  Service: Cardiovascular;  Laterality: N/A;   CATARACT EXTRACTION, BILATERAL     COLONOSCOPY WITH PROPOFOL N/A 12/08/2015   Procedure: COLONOSCOPY WITH PROPOFOL;  Surgeon: Christena Deem, MD;  Location: [facility] ENDOSCOPY;  Service: Endoscopy;  Laterality: N/A;   COLONOSCOPY WITH PROPOFOL N/A 12/09/2015   Procedure: COLONOSCOPY WITH PROPOFOL;  Surgeon: Daphine Deutscher  Cyndia Skeeters, MD;  Location: [facility] ENDOSCOPY;  Service: Endoscopy;  Laterality: N/A;   CORONARY ANGIOPLASTY     5 stents   CORONARY STENT PLACEMENT  2015   x5   CYSTOSCOPY N/A 09/11/2018   Procedure: CYSTOSCOPY;  Surgeon: Vanna Scotland, MD;  Location: [facility] ORS;  Service: Urology;  Laterality: N/A;   CYSTOSCOPY WITH BIOPSY N/A 12/21/2017   Procedure: CYSTOSCOPY WITH BIOPSY;  Surgeon: Vanna Scotland, MD;  Location: [facility] ORS;  Service: Urology;  Laterality: N/A;   ESOPHAGOGASTRODUODENOSCOPY (EGD) WITH PROPOFOL N/A 12/08/2015   Procedure: ESOPHAGOGASTRODUODENOSCOPY (EGD) WITH PROPOFOL;  Surgeon: Christena Deem, MD;  Location: [facility] ENDOSCOPY;  Service: Endoscopy;  Laterality: N/A;   ESOPHAGOGASTRODUODENOSCOPY (EGD) WITH  PROPOFOL N/A 12/13/2017   Procedure: ESOPHAGOGASTRODUODENOSCOPY (EGD) WITH PROPOFOL;  Surgeon: Christena Deem, MD;  Location: [facility] ENDOSCOPY;  Service: Endoscopy;  Laterality: N/A;   EYE SURGERY     HERNIA REPAIR     kidney tumor remove     TRANSURETHRAL RESECTION OF BLADDER TUMOR WITH GYRUS (TURBT-GYRUS)  12/2013   UMBILICAL HERNIA REPAIR     urethral meatotomy     Patient Active Problem List   Diagnosis Date Noted   Class 2 obesity due to excess calories with body mass index (BMI) of 36.0 to 36.9 in adult 04/11/2020   Swelling of limb 03/28/2020   Lymphedema 03/28/2020   Trigeminal neuralgia 12/25/2019   Lower limb ulcer, calf, left, limited to breakdown of skin (HCC) 12/25/2019   OSA (obstructive sleep apnea) 01/23/2019   Acute systolic CHF (congestive heart failure) (HCC) 12/12/2018   Bruising 07/10/2018   Diet-controlled type 2 diabetes mellitus (HCC) 03/24/2018   Microalbuminuria 03/24/2018   Dizziness 11/17/2017   Simple chronic bronchitis (HCC) 09/22/2017   SOBOE (shortness of breath on exertion) 02/18/2017   Chronic midline low back pain without sciatica 12/29/2016   Periodic limb movement disorder 12/29/2016   Benign essential tremor 06/22/2016   Pure hypercholesterolemia 06/20/2015   Erectile dysfunction due to arterial insufficiency 06/16/2015   Unstable angina (HCC) 01/22/2015   Abdominal aortic 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 rupture (HCC) 12/31/2014   Benign prostatic hypertrophy without urinary obstruction 07/31/2014   Enlarged prostate 07/31/2014   Benign essential HTN 06/11/2014   Carotid artery narrowing 02/08/2014   Carotid artery obstruction 02/08/2014   Bilateral carotid artery stenosis 02/08/2014   Chest pain 08/20/2013   Bilateral cataracts 05/16/2013   Cataract 05/16/2013   Clinical depression 12/12/2012   Diabetes mellitus, type 2 (HCC) 12/12/2012   Combined fat and carbohydrate induced hyperlipemia 12/12/2012   Major depressive disorder, single  episode, unspecified 12/12/2012   Acid reflux 11/09/2012   Presence of stent in coronary artery 11/09/2012   BP (high blood pressure) 11/09/2012   Healed myocardial infarct 11/09/2012   Gastro-esophageal reflux disease without esophagitis 11/09/2012   History of cardiovascular surgery 11/09/2012   Fothergill's neuralgia 08/07/2012   Swelling of testicle 04/06/2012   Disorder of male genital organ 04/06/2012   Swelling of the testicles 04/06/2012   Arteriosclerosis of coronary artery 11/16/2011   CAD in native artery 11/16/2011   Fatigue 06/04/2011   Avitaminosis D 11/30/2010    ONSET DATE: 03/17/2022- Worsening symptoms  REFERRING DIAG:  G20.A1 (ICD-10-CM) - Parkinsons  R26.2 (ICD-10-CM) - Difficulty walking    THERAPY DIAG:  Difficulty in walking, not elsewhere classified  Muscle weakness (generalized)  Abnormality of gait and mobility  Rationale for Evaluation and Treatment: Rehabilitation  SUBJECTIVE:  SUBJECTIVE STATEMENT: Patient reports he is recovering from an UTI. Is feeling better than previous session.   Patient reports 0/10  Pt accompanied by: significant other  PERTINENT HISTORY: Patient is a 77 year old male with referral to outpatient PT for Parkinsons and difficulty walking. He was recently seen by Neurology worsening symptoms of parkinson- difficulty with walking. He has past medical history of Arthritis, Bladder cancer, andTrigeminal Neuralgia.  PAIN:  Are you having pain? Yes 5/10 low back   PRECAUTIONS: Fall  WEIGHT BEARING RESTRICTIONS: No  FALLS: Has patient fallen in last 6 months? Yes. Number of falls 1  LIVING ENVIRONMENT: Lives with: lives with their spouse Lives in: House/apartment Stairs: Yes: External: 2 steps; has grab bar Has following equipment at  home: Dan Humphreys - 2 wheeled, Environmental consultant - 4 wheeled, and Grab bars  PLOF: Independent with household mobility with device, Independent with community mobility with device, Independent with transfers, and Requires assistive device for independence  PATIENT GOALS: Get to where I can walk better again and get my legs stronger.   OBJECTIVE:    TODAY'S TREATMENT:                                                                                                                              DATE: 09/23/22   Therex:      Nustep- Interval- LE Only with level 1-6 x 6 min total at 0.18 mi   High knee marching without UE support ( with rollator) - light UE touch  12x each LE  3lb ankle weights: with Brollator -hip flexion 12x each LE -hip abduction 12x each LE -hip extension 12x each LE     Sit to stand 10x;  Ambulate 150 ft with 3lb ankle weights with bariatirc RW   Seated with 3lb ankle weights:  -march 15x each LE -LAQ 10x 3 second holds  -heel raise 15x      PATIENT EDUCATION: Education details: Plan of care; purpose of functional outcome measures.  Person educated: Patient Education method: Explanation, Demonstration, Tactile cues, Verbal cues, and Handouts Education comprehension: verbalized understanding, returned demonstration, verbal cues required, tactile cues required, and needs further education  HOME EXERCISE PROGRAM: Access Code: [email or web address] URL: [email or web address] Date: 08/11/2022 Prepared by: Maureen Ralphs  Exercises - Seated Lumbar Flexion Stretch  - 7 x weekly - 3 sets - 30 sec hold - Seated Thoracic Flexion and Rotation with Arms Crossed  - 1 x daily - 7 x weekly - 3 sets - 30 sec hold - Seated Figure 4 Piriformis Stretch  - 1 x daily - 7 x weekly - 3 sets - 10 reps  GOALS: Goals reviewed with patient? Yes  SHORT TERM GOALS: Target date: 09/20/2022  Pt will be independent with a comprehensive HEP in order to improve strength and balance in  order to decrease fall risk and improve function at home and work.  Baseline: EVAL: Patient admits not performing much exercises - not  moving around well.  Goal status: INITIAL   LONG TERM GOALS: Target date: 11/01/2022  Pt will improve FOTO to target score of 45 % to display perceived improvements in ability to complete ADL's.  Baseline: EVAL: 42 Goal status: INITIAL  2.  Pt will decrease 5TSTS by at least 5 seconds in order to demonstrate clinically significant improvement in LE strength.  Baseline: EVAL = 17.39 sec without UE support Goal status: INITIAL  3. Pt will increase by at least 0.15 m/s in order to demonstrate clinically significant improvement in community ambulation   Baseline: EVAL= 0.67 m/s Goal status: INITIAL  4.  Pt will improve BERG by at least 3 points in order to demonstrate clinically significant improvement in balance.   Baseline: EVAL: to be assessed next visit: 08/23/2022= 36/56 Goal status: INITIAL  5.  Pt will decrease TUG to below 14 seconds/decrease in order to demonstrate decreased fall risk. Baseline: EVAL: 22.00 sec with upright 4WW Goal status: INITIAL  6.  Pt will increase by at least 84m (128ft) in order to demonstrate clinically significant improvement in cardiopulmonary endurance and community ambulation  Baseline: EVAL: 3:45 min (450 feet with upright 4WW) Goal status: INITIAL 7.   Patient will report returning to walking in to community places using most appropriate assistive device vs current use of scooter for improved abilities with mobility during community outings Baseline: EVAL: Current using scooter for many social/community outings  Goal status: INITIAL ASSESSMENT:  CLINICAL IMPRESSION: Patient presents with excellent motivation throughout session. He is able to tolerate progressive strengthening and mobility exercises. Occasional rest breaks required. Vitals monitored with good range throughout session. PT will continue to  benefit from skilled PT services to improve his overall strength and mobility to maximize his independent function while decreasing his fall risk.   OBJECTIVE IMPAIRMENTS: Abnormal gait, cardiopulmonary status limiting activity, decreased activity tolerance, decreased balance, decreased coordination, decreased endurance, decreased mobility, difficulty walking, decreased ROM, decreased strength, hypomobility, impaired flexibility, impaired sensation, impaired UE functional use, postural dysfunction, obesity, and pain.   ACTIVITY LIMITATIONS: carrying, lifting, bending, standing, squatting, stairs, transfers, continence, and reach over head  PARTICIPATION LIMITATIONS: meal prep, cleaning, laundry, driving, shopping, community activity, and yard work  PERSONAL FACTORS: Age and 3+ comorbidities: HTN, Arthritis, Low back pain, Trigeminal Neuralgia  are also affecting patient's functional outcome.   REHAB POTENTIAL: Good  CLINICAL DECISION MAKING: Evolving/moderate complexity  EVALUATION COMPLEXITY: Moderate  PLAN:  PT FREQUENCY: 1-2x/week  PT DURATION: 12 weeks  PLANNED INTERVENTIONS: Therapeutic exercises, Therapeutic activity, Neuromuscular re-education, Balance training, Gait training, Patient/Family education, Self Care, Joint mobilization, Stair training, Vestibular training, Canalith repositioning, DME instructions, Dry Needling, Electrical stimulation, Spinal manipulation, Spinal mobilization, Cryotherapy, Moist heat, Manual therapy, and Re-evaluation  PLAN FOR NEXT SESSION: Progress therex, bed mobility, and balance training.    Precious Bard, PT 09/23/2022, 10:14 AM  10:14 AM, 09/23/22  Physical Therapist - [facility]  Outpatient Physical Therapy- Main Campus [phone]

## 2022-09-23 ENCOUNTER — Ambulatory Visit: Payer: Medicare HMO

## 2022-09-23 DIAGNOSIS — R269 Unspecified abnormalities of gait and mobility: Secondary | ICD-10-CM

## 2022-09-23 DIAGNOSIS — R262 Difficulty in walking, not elsewhere classified: Secondary | ICD-10-CM

## 2022-09-23 DIAGNOSIS — M6281 Muscle weakness (generalized): Secondary | ICD-10-CM

## 2022-09-24 ENCOUNTER — Ambulatory Visit: Payer: Medicare HMO

## 2022-09-28 NOTE — Therapy (Signed)
OUTPATIENT PHYSICAL THERAPY NEURO TREATMENT    Patient Name: [name] MRN: [id number] DOB:07-06-1945, 77 y.o., male Today's Date: 09/29/2022   PCP: Ardyth Man, PA- C REFERRING PROVIDER: Morene Crocker, MD  END OF SESSION:  PT End of Session - 09/29/22 1118     Visit Number 13    Number of Visits 24    Date for PT Re-Evaluation 11/01/22    Authorization Type [employer]    Progress Note Due on Visit 20    PT Start Time 1102    Equipment Utilized During Treatment Gait belt    Activity Tolerance Patient tolerated treatment well;No increased pain;Patient limited by pain    Behavior During Therapy [facility] for tasks assessed/performed                 Past Medical History:  Diagnosis Date   Anxiety    Arteriosclerosis of coronary artery 11/16/2011   Overview:  Stent 10/2011 stent rca 2015 with collaterals to lad which is chronically occluded    Benign enlargement of prostate    Benign essential HTN 06/11/2014   Benign prostatic hypertrophy without urinary obstruction 07/31/2014   Bilateral cataracts 05/16/2013   Overview:  [facility]     Bone spur of foot    Left   BP (high blood pressure) 11/09/2012   Cancer (HCC)    skin (forehead) and bladder   Carotid artery narrowing 02/08/2014   Depression    Detrusor hypertrophy    Diabetes (HCC)    Diabetes mellitus, type 2 (HCC) 12/12/2012   Diverticulosis    Dyspnea    Esophageal reflux    Esophageal reflux    Fothergill's neuralgia 08/07/2012   Overview:  [facility]    Gastritis    GERD (gastroesophageal reflux disease)    Headache    cluster headaches   Healed myocardial infarct 11/09/2012   Hearing loss in left ear    Heart disease    Hematuria    Hemorrhoids    History of hiatal hernia 12/14/2017   small    Hypercholesteremia    Lesion of bladder    Myocardial infarct (HCC)    Presence of stent in coronary artery 11/09/2012   Rectal bleeding    Trigeminal neuralgia    Trigeminal  neuralgia    Valvular heart disease    Vitamin D deficiency    Past Surgical History:  Procedure Laterality Date   APPENDECTOMY     BOTOX INJECTION N/A 12/21/2017   Procedure: Bladder BOTOX INJECTION;  Surgeon: Vanna Scotland, MD;  Location: [facility] ORS;  Service: Urology;  Laterality: N/A;   BOTOX INJECTION N/A 09/11/2018   Procedure: Bladder BOTOX INJECTION;  Surgeon: Vanna Scotland, MD;  Location: [facility] ORS;  Service: Urology;  Laterality: N/A;   CARDIAC CATHETERIZATION     CARDIAC CATHETERIZATION N/A 01/22/2015   Procedure: Left Heart Cath;  Surgeon: Lamar Blinks, MD;  Location: [facility] INVASIVE CV LAB;  Service: Cardiovascular;  Laterality: N/A;   CARDIAC CATHETERIZATION N/A 01/22/2015   Procedure: Coronary Stent Intervention;  Surgeon: Marcina Millard, MD;  Location: [facility] INVASIVE CV LAB;  Service: Cardiovascular;  Laterality: N/A;   CATARACT EXTRACTION, BILATERAL     COLONOSCOPY WITH PROPOFOL N/A 12/08/2015   Procedure: COLONOSCOPY WITH PROPOFOL;  Surgeon: Christena Deem, MD;  Location: [facility] ENDOSCOPY;  Service: Endoscopy;  Laterality: N/A;   COLONOSCOPY WITH PROPOFOL N/A 12/09/2015   Procedure: COLONOSCOPY WITH PROPOFOL;  Surgeon: Christena Deem, MD;  Location: [facility]  ENDOSCOPY;  Service: Endoscopy;  Laterality: N/A;   CORONARY ANGIOPLASTY     5 stents   CORONARY STENT PLACEMENT  2015   x5   CYSTOSCOPY N/A 09/11/2018   Procedure: CYSTOSCOPY;  Surgeon: Vanna Scotland, MD;  Location: [facility] ORS;  Service: Urology;  Laterality: N/A;   CYSTOSCOPY WITH BIOPSY N/A 12/21/2017   Procedure: CYSTOSCOPY WITH BIOPSY;  Surgeon: Vanna Scotland, MD;  Location: [facility] ORS;  Service: Urology;  Laterality: N/A;   ESOPHAGOGASTRODUODENOSCOPY (EGD) WITH PROPOFOL N/A 12/08/2015   Procedure: ESOPHAGOGASTRODUODENOSCOPY (EGD) WITH PROPOFOL;  Surgeon: Christena Deem, MD;  Location: [facility] ENDOSCOPY;  Service: Endoscopy;  Laterality: N/A;   ESOPHAGOGASTRODUODENOSCOPY (EGD) WITH PROPOFOL N/A 12/13/2017    Procedure: ESOPHAGOGASTRODUODENOSCOPY (EGD) WITH PROPOFOL;  Surgeon: Christena Deem, MD;  Location: [facility] ENDOSCOPY;  Service: Endoscopy;  Laterality: N/A;   EYE SURGERY     HERNIA REPAIR     kidney tumor remove     TRANSURETHRAL RESECTION OF BLADDER TUMOR WITH GYRUS (TURBT-GYRUS)  12/2013   UMBILICAL HERNIA REPAIR     urethral meatotomy     Patient Active Problem List   Diagnosis Date Noted   Class 2 obesity due to excess calories with body mass index (BMI) of 36.0 to 36.9 in adult 04/11/2020   Swelling of limb 03/28/2020   Lymphedema 03/28/2020   Trigeminal neuralgia 12/25/2019   Lower limb ulcer, calf, left, limited to breakdown of skin (HCC) 12/25/2019   OSA (obstructive sleep apnea) 01/23/2019   Acute systolic CHF (congestive heart failure) (HCC) 12/12/2018   Bruising 07/10/2018   Diet-controlled type 2 diabetes mellitus (HCC) 03/24/2018   Microalbuminuria 03/24/2018   Dizziness 11/17/2017   Simple chronic bronchitis (HCC) 09/22/2017   SOBOE (shortness of breath on exertion) 02/18/2017   Chronic midline low back pain without sciatica 12/29/2016   Periodic limb movement disorder 12/29/2016   Benign essential tremor 06/22/2016   Pure hypercholesterolemia 06/20/2015   Erectile dysfunction due to arterial insufficiency 06/16/2015   Unstable angina (HCC) 01/22/2015   Abdominal aortic 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 rupture (HCC) 12/31/2014   Benign prostatic hypertrophy without urinary obstruction 07/31/2014   Enlarged prostate 07/31/2014   Benign essential HTN 06/11/2014   Carotid artery narrowing 02/08/2014   Carotid artery obstruction 02/08/2014   Bilateral carotid artery stenosis 02/08/2014   Chest pain 08/20/2013   Bilateral cataracts 05/16/2013   Cataract 05/16/2013   Clinical depression 12/12/2012   Diabetes mellitus, type 2 (HCC) 12/12/2012   Combined fat and carbohydrate induced hyperlipemia 12/12/2012   Major depressive disorder, single episode, unspecified 12/12/2012    Acid reflux 11/09/2012   Presence of stent in coronary artery 11/09/2012   BP (high blood pressure) 11/09/2012   Healed myocardial infarct 11/09/2012   Gastro-esophageal reflux disease without esophagitis 11/09/2012   History of cardiovascular surgery 11/09/2012   Fothergill's neuralgia 08/07/2012   Swelling of testicle 04/06/2012   Disorder of male genital organ 04/06/2012   Swelling of the testicles 04/06/2012   Arteriosclerosis of coronary artery 11/16/2011   CAD in native artery 11/16/2011   Fatigue 06/04/2011   Avitaminosis D 11/30/2010    ONSET DATE: 03/17/2022- Worsening symptoms  REFERRING DIAG:  G20.A1 (ICD-10-CM) - Parkinsons  R26.2 (ICD-10-CM) - Difficulty walking    THERAPY DIAG:  Difficulty in walking, not elsewhere classified  Muscle weakness (generalized)  Abnormality of gait and mobility  Other lack of coordination  Unsteadiness on feet  Other abnormalities of gait and mobility  Rationale for Evaluation and Treatment: Rehabilitation  SUBJECTIVE:  SUBJECTIVE STATEMENT: Patient reports doing well- states went to Neurologist and he increased his PD meds.    Patient reports 0/10  Pt accompanied by: significant other  PERTINENT HISTORY: Patient is a 77 year old male with referral to outpatient PT for Parkinsons and difficulty walking. He was recently seen by Neurology worsening symptoms of parkinson- difficulty with walking. He has past medical history of Arthritis, Bladder cancer, andTrigeminal Neuralgia.  PAIN:  Are you having pain? Yes 5/10 low back   PRECAUTIONS: Fall  WEIGHT BEARING RESTRICTIONS: No  FALLS: Has patient fallen in last 6 months? Yes. Number of falls 1  LIVING ENVIRONMENT: Lives with: lives with their spouse Lives in: House/apartment Stairs:  Yes: External: 2 steps; has grab bar Has following equipment at home: Dan Humphreys - 2 wheeled, Environmental consultant - 4 wheeled, and Grab bars  PLOF: Independent with household mobility with device, Independent with community mobility with device, Independent with transfers, and Requires assistive device for independence  PATIENT GOALS: Get to where I can walk better again and get my legs stronger.   OBJECTIVE:    TODAY'S TREATMENT:                                                                                                                              DATE: 09/29/22   Therex:      Nustep- Interval- LE Only with level 1-6 x 6 min total at 0.20 mi   High knee marching without UE support in // bars - light UE touch  12x each LE  Sit to stand with Overhead UE raises- x 15 reps   Mini squat with arms raises into standing with shoulder ext x 12 reps  Standing- dynamic lateral weight shifting- Reaching toward ceiling with 1 UE then side step and opp UE reaching.   Standing with arms outstretched - thoracic rotation x 10 reps each.   Forward step up/over 2 (1/2 foam) in // bars without UE Support x 10 each Way  Side step up/over 2 (1/2 foam) in // bars without UE support x 10 each way.        PATIENT EDUCATION: Education details: Plan of care; purpose of functional outcome measures.  Person educated: Patient Education method: Explanation, Demonstration, Tactile cues, Verbal cues, and Handouts Education comprehension: verbalized understanding, returned demonstration, verbal cues required, tactile cues required, and needs further education  HOME EXERCISE PROGRAM: Access Code: [email or web address] URL: [email or web address] Date: 08/11/2022 Prepared by: Maureen Ralphs  Exercises - Seated Lumbar Flexion Stretch  - 7 x weekly - 3 sets - 30 sec hold - Seated Thoracic Flexion and Rotation with Arms Crossed  - 1 x daily - 7 x weekly - 3 sets - 30 sec hold - Seated Figure 4 Piriformis  Stretch  - 1 x daily - 7 x weekly - 3 sets - 10 reps  GOALS: Goals reviewed with patient? Yes  SHORT TERM GOALS: Target date: 09/20/2022  Pt will be independent  with a comprehensive HEP in order to improve strength and balance in order to decrease fall risk and improve function at home and work.  Baseline: EVAL: Patient admits not performing much exercises - not moving around well.  Goal status: INITIAL   LONG TERM GOALS: Target date: 11/01/2022  Pt will improve FOTO to target score of 45 % to display perceived improvements in ability to complete ADL's.  Baseline: EVAL: 42 Goal status: INITIAL  2.  Pt will decrease 5TSTS by at least 5 seconds in order to demonstrate clinically significant improvement in LE strength.  Baseline: EVAL = 17.39 sec without UE support Goal status: INITIAL  3. Pt will increase by at least 0.15 m/s in order to demonstrate clinically significant improvement in community ambulation   Baseline: EVAL= 0.67 m/s Goal status: INITIAL  4.  Pt will improve BERG by at least 3 points in order to demonstrate clinically significant improvement in balance.   Baseline: EVAL: to be assessed next visit: 08/23/2022= 36/56 Goal status: INITIAL  5.  Pt will decrease TUG to below 14 seconds/decrease in order to demonstrate decreased fall risk. Baseline: EVAL: 22.00 sec with upright 4WW Goal status: INITIAL  6.  Pt will increase by at least 37m (133ft) in order to demonstrate clinically significant improvement in cardiopulmonary endurance and community ambulation  Baseline: EVAL: 3:45 min (450 feet with upright 4WW) Goal status: INITIAL 7.   Patient will report returning to walking in to community places using most appropriate assistive device vs current use of scooter for improved abilities with mobility during community outings Baseline: EVAL: Current using scooter for many social/community outings  Goal status: INITIAL ASSESSMENT:  CLINICAL IMPRESSION: Patient  responded well to standing movement exercises- able to raise his arms without report of increased pain. He remains very stiff overall but was able to improve his thoracic rotation with reps. He exhibited no freezing or any significant shuffling today during session. PT will continue to benefit from skilled PT services to improve his overall strength and mobility to maximize his independent function while decreasing his fall risk.   OBJECTIVE IMPAIRMENTS: Abnormal gait, cardiopulmonary status limiting activity, decreased activity tolerance, decreased balance, decreased coordination, decreased endurance, decreased mobility, difficulty walking, decreased ROM, decreased strength, hypomobility, impaired flexibility, impaired sensation, impaired UE functional use, postural dysfunction, obesity, and pain.   ACTIVITY LIMITATIONS: carrying, lifting, bending, standing, squatting, stairs, transfers, continence, and reach over head  PARTICIPATION LIMITATIONS: meal prep, cleaning, laundry, driving, shopping, community activity, and yard work  PERSONAL FACTORS: Age and 3+ comorbidities: HTN, Arthritis, Low back pain, Trigeminal Neuralgia  are also affecting patient's functional outcome.   REHAB POTENTIAL: Good  CLINICAL DECISION MAKING: Evolving/moderate complexity  EVALUATION COMPLEXITY: Moderate  PLAN:  PT FREQUENCY: 1-2x/week  PT DURATION: 12 weeks  PLANNED INTERVENTIONS: Therapeutic exercises, Therapeutic activity, Neuromuscular re-education, Balance training, Gait training, Patient/Family education, Self Care, Joint mobilization, Stair training, Vestibular training, Canalith repositioning, DME instructions, Dry Needling, Electrical stimulation, Spinal manipulation, Spinal mobilization, Cryotherapy, Moist heat, Manual therapy, and Re-evaluation  PLAN FOR NEXT SESSION: Progress therex, bed mobility, and balance training.    Lenda Kelp, PT 09/29/2022, 11:21 AM  11:21 AM,  09/29/22  Physical Therapist - [facility]  Outpatient Physical Therapy- Main Campus [phone]

## 2022-09-29 ENCOUNTER — Ambulatory Visit: Payer: Medicare HMO

## 2022-09-29 DIAGNOSIS — R2681 Unsteadiness on feet: Secondary | ICD-10-CM

## 2022-09-29 DIAGNOSIS — R262 Difficulty in walking, not elsewhere classified: Secondary | ICD-10-CM | POA: Diagnosis not present

## 2022-09-29 DIAGNOSIS — R2689 Other abnormalities of gait and mobility: Secondary | ICD-10-CM

## 2022-09-29 DIAGNOSIS — R278 Other lack of coordination: Secondary | ICD-10-CM

## 2022-09-29 DIAGNOSIS — R269 Unspecified abnormalities of gait and mobility: Secondary | ICD-10-CM

## 2022-09-29 DIAGNOSIS — M6281 Muscle weakness (generalized): Secondary | ICD-10-CM

## 2022-10-01 ENCOUNTER — Ambulatory Visit: Payer: Medicare HMO | Attending: Neurology

## 2022-10-01 DIAGNOSIS — R2681 Unsteadiness on feet: Secondary | ICD-10-CM | POA: Diagnosis present

## 2022-10-01 DIAGNOSIS — R262 Difficulty in walking, not elsewhere classified: Secondary | ICD-10-CM

## 2022-10-01 DIAGNOSIS — R2689 Other abnormalities of gait and mobility: Secondary | ICD-10-CM

## 2022-10-01 DIAGNOSIS — M545 Low back pain, unspecified: Secondary | ICD-10-CM

## 2022-10-01 DIAGNOSIS — M6281 Muscle weakness (generalized): Secondary | ICD-10-CM | POA: Diagnosis present

## 2022-10-01 DIAGNOSIS — R278 Other lack of coordination: Secondary | ICD-10-CM

## 2022-10-01 DIAGNOSIS — R269 Unspecified abnormalities of gait and mobility: Secondary | ICD-10-CM

## 2022-10-01 DIAGNOSIS — G8929 Other chronic pain: Secondary | ICD-10-CM | POA: Insufficient documentation

## 2022-10-01 NOTE — Therapy (Signed)
OUTPATIENT PHYSICAL THERAPY NEURO TREATMENT    Patient Name: GAETANO ROMBERGER MRN: [id number] DOB:12-25-1945, 77 y.o., male Today's Date: 10/01/2022   PCP: Ardyth Man, PA- C REFERRING PROVIDER: Morene Crocker, MD  END OF SESSION:  PT End of Session - 10/01/22 1018     Visit Number 14    Number of Visits 24    Date for PT Re-Evaluation 11/01/22    Authorization Type [employer]    Progress Note Due on Visit 20    PT Start Time 1015    Equipment Utilized During Treatment Gait belt    Activity Tolerance Patient tolerated treatment well;No increased pain;Patient limited by pain    Behavior During Therapy [facility] for tasks assessed/performed                  Past Medical History:  Diagnosis Date   Anxiety    Arteriosclerosis of coronary artery 11/16/2011   Overview:  Stent 10/2011 stent rca 2015 with collaterals to lad which is chronically occluded    Benign enlargement of prostate    Benign essential HTN 06/11/2014   Benign prostatic hypertrophy without urinary obstruction 07/31/2014   Bilateral cataracts 05/16/2013   Overview:  [facility]     Bone spur of foot    Left   BP (high blood pressure) 11/09/2012   Cancer (HCC)    skin (forehead) and bladder   Carotid artery narrowing 02/08/2014   Depression    Detrusor hypertrophy    Diabetes (HCC)    Diabetes mellitus, type 2 (HCC) 12/12/2012   Diverticulosis    Dyspnea    Esophageal reflux    Esophageal reflux    Fothergill's neuralgia 08/07/2012   Overview:  [facility] Neurology    Gastritis    GERD (gastroesophageal reflux disease)    Headache    cluster headaches   Healed myocardial infarct 11/09/2012   Hearing loss in left ear    Heart disease    Hematuria    Hemorrhoids    History of hiatal hernia 12/14/2017   small    Hypercholesteremia    Lesion of bladder    Myocardial infarct (HCC)    Presence of stent in coronary artery 11/09/2012   Rectal bleeding    Trigeminal neuralgia    Trigeminal  neuralgia    Valvular heart disease    Vitamin D deficiency    Past Surgical History:  Procedure Laterality Date   APPENDECTOMY     BOTOX INJECTION N/A 12/21/2017   Procedure: Bladder BOTOX INJECTION;  Surgeon: Vanna Scotland, MD;  Location: [facility] ORS;  Service: Urology;  Laterality: N/A;   BOTOX INJECTION N/A 09/11/2018   Procedure: Bladder BOTOX INJECTION;  Surgeon: Vanna Scotland, MD;  Location: [facility] ORS;  Service: Urology;  Laterality: N/A;   CARDIAC CATHETERIZATION     CARDIAC CATHETERIZATION N/A 01/22/2015   Procedure: Left Heart Cath;  Surgeon: Lamar Blinks, MD;  Location: [facility] INVASIVE CV LAB;  Service: Cardiovascular;  Laterality: N/A;   CARDIAC CATHETERIZATION N/A 01/22/2015   Procedure: Coronary Stent Intervention;  Surgeon: Marcina Millard, MD;  Location: [facility] INVASIVE CV LAB;  Service: Cardiovascular;  Laterality: N/A;   CATARACT EXTRACTION, BILATERAL     COLONOSCOPY WITH PROPOFOL N/A 12/08/2015   Procedure: COLONOSCOPY WITH PROPOFOL;  Surgeon: Christena Deem, MD;  Location: [facility] ENDOSCOPY;  Service: Endoscopy;  Laterality: N/A;   COLONOSCOPY WITH PROPOFOL N/A 12/09/2015   Procedure: COLONOSCOPY WITH PROPOFOL;  Surgeon: Christena Deem, MD;  Location:  [facility] ENDOSCOPY;  Service: Endoscopy;  Laterality: N/A;   CORONARY ANGIOPLASTY     5 stents   CORONARY STENT PLACEMENT  2015   x5   CYSTOSCOPY N/A 09/11/2018   Procedure: CYSTOSCOPY;  Surgeon: Vanna Scotland, MD;  Location: [facility] ORS;  Service: Urology;  Laterality: N/A;   CYSTOSCOPY WITH BIOPSY N/A 12/21/2017   Procedure: CYSTOSCOPY WITH BIOPSY;  Surgeon: Vanna Scotland, MD;  Location: [facility] ORS;  Service: Urology;  Laterality: N/A;   ESOPHAGOGASTRODUODENOSCOPY (EGD) WITH PROPOFOL N/A 12/08/2015   Procedure: ESOPHAGOGASTRODUODENOSCOPY (EGD) WITH PROPOFOL;  Surgeon: Christena Deem, MD;  Location: [facility] ENDOSCOPY;  Service: Endoscopy;  Laterality: N/A;   ESOPHAGOGASTRODUODENOSCOPY (EGD) WITH PROPOFOL N/A 12/13/2017    Procedure: ESOPHAGOGASTRODUODENOSCOPY (EGD) WITH PROPOFOL;  Surgeon: Christena Deem, MD;  Location: [facility] ENDOSCOPY;  Service: Endoscopy;  Laterality: N/A;   EYE SURGERY     HERNIA REPAIR     kidney tumor remove     TRANSURETHRAL RESECTION OF BLADDER TUMOR WITH GYRUS (TURBT-GYRUS)  12/2013   UMBILICAL HERNIA REPAIR     urethral meatotomy     Patient Active Problem List   Diagnosis Date Noted   Class 2 obesity due to excess calories with body mass index (BMI) of 36.0 to 36.9 in adult 04/11/2020   Swelling of limb 03/28/2020   Lymphedema 03/28/2020   Trigeminal neuralgia 12/25/2019   Lower limb ulcer, calf, left, limited to breakdown of skin (HCC) 12/25/2019   OSA (obstructive sleep apnea) 01/23/2019   Acute systolic CHF (congestive heart failure) (HCC) 12/12/2018   Bruising 07/10/2018   Diet-controlled type 2 diabetes mellitus (HCC) 03/24/2018   Microalbuminuria 03/24/2018   Dizziness 11/17/2017   Simple chronic bronchitis (HCC) 09/22/2017   SOBOE (shortness of breath on exertion) 02/18/2017   Chronic midline low back pain without sciatica 12/29/2016   Periodic limb movement disorder 12/29/2016   Benign essential tremor 06/22/2016   Pure hypercholesterolemia 06/20/2015   Erectile dysfunction due to arterial insufficiency 06/16/2015   Unstable angina (HCC) 01/22/2015   Abdominal aortic 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 rupture (HCC) 12/31/2014   Benign prostatic hypertrophy without urinary obstruction 07/31/2014   Enlarged prostate 07/31/2014   Benign essential HTN 06/11/2014   Carotid artery narrowing 02/08/2014   Carotid artery obstruction 02/08/2014   Bilateral carotid artery stenosis 02/08/2014   Chest pain 08/20/2013   Bilateral cataracts 05/16/2013   Cataract 05/16/2013   Clinical depression 12/12/2012   Diabetes mellitus, type 2 (HCC) 12/12/2012   Combined fat and carbohydrate induced hyperlipemia 12/12/2012   Major depressive disorder, single episode, unspecified 12/12/2012    Acid reflux 11/09/2012   Presence of stent in coronary artery 11/09/2012   BP (high blood pressure) 11/09/2012   Healed myocardial infarct 11/09/2012   Gastro-esophageal reflux disease without esophagitis 11/09/2012   History of cardiovascular surgery 11/09/2012   Fothergill's neuralgia 08/07/2012   Swelling of testicle 04/06/2012   Disorder of male genital organ 04/06/2012   Swelling of the testicles 04/06/2012   Arteriosclerosis of coronary artery 11/16/2011   CAD in native artery 11/16/2011   Fatigue 06/04/2011   Avitaminosis D 11/30/2010    ONSET DATE: 03/17/2022- Worsening symptoms  REFERRING DIAG:  G20.A1 (ICD-10-CM) - Parkinsons  R26.2 (ICD-10-CM) - Difficulty walking    THERAPY DIAG:  Difficulty in walking, not elsewhere classified  Muscle weakness (generalized)  Abnormality of gait and mobility  Other lack of coordination  Unsteadiness on feet  Other abnormalities of gait and mobility  Chronic bilateral low back pain without sciatica  Rationale for Evaluation and Treatment: Rehabilitation  SUBJECTIVE:                                                                                                                                                                                             SUBJECTIVE STATEMENT: Patient reports continuing to have a pretty good week. No   Patient reports 0/10  Pt accompanied by: significant other  PERTINENT HISTORY: Patient is a 77 year old male with referral to outpatient PT for Parkinsons and difficulty walking. He was recently seen by Neurology worsening symptoms of parkinson- difficulty with walking. He has past medical history of Arthritis, Bladder cancer, andTrigeminal Neuralgia.  PAIN:  Are you having pain? NO  PRECAUTIONS: Fall  WEIGHT BEARING RESTRICTIONS: No  FALLS: Has patient fallen in last 6 months? Yes. Number of falls 1  LIVING ENVIRONMENT: Lives with: lives with their spouse Lives in:  House/apartment Stairs: Yes: External: 2 steps; has grab bar Has following equipment at home: Dan Humphreys - 2 wheeled, Environmental consultant - 4 wheeled, and Grab bars  PLOF: Independent with household mobility with device, Independent with community mobility with device, Independent with transfers, and Requires assistive device for independence  PATIENT GOALS: Get to where I can walk better again and get my legs stronger.   OBJECTIVE:    TODAY'S TREATMENT:                                                                                                                              DATE: 10/01/22   Therex:      Nustep- Interval- LE Only with level 1-5 x 5 min total at 0.16 mi (Continuously monitored for cardiovascular response) - VC for > 50 SPM  Circuit style workout :  3 rounds of:  Resistive gait with 4WW- 3# AW x 100 feet  Seated running man- Alt UE/LE (hip march with UE shoulder flex/ext) x 12 Seated scap retraction GTB x 12  3 rounds:  Resistive gait with 4WW 3# AW  Sit to stand with calf raises x 12 reps  Standing running man (  opp UE raise/LE - hip flex) x 12 reps (VC for increased step height)         PATIENT EDUCATION: Education details: Plan of care; purpose of functional outcome measures.  Person educated: Patient Education method: Explanation, Demonstration, Tactile cues, Verbal cues, and Handouts Education comprehension: verbalized understanding, returned demonstration, verbal cues required, tactile cues required, and needs further education  HOME EXERCISE PROGRAM: Access Code: [email or web address] URL: [email or web address] Date: 08/11/2022 Prepared by: Maureen Ralphs  Exercises - Seated Lumbar Flexion Stretch  - 7 x weekly - 3 sets - 30 sec hold - Seated Thoracic Flexion and Rotation with Arms Crossed  - 1 x daily - 7 x weekly - 3 sets - 30 sec hold - Seated Figure 4 Piriformis Stretch  - 1 x daily - 7 x weekly - 3 sets - 10 reps  GOALS: Goals reviewed with  patient? Yes  SHORT TERM GOALS: Target date: 09/20/2022  Pt will be independent with a comprehensive HEP in order to improve strength and balance in order to decrease fall risk and improve function at home and work.  Baseline: EVAL: Patient admits not performing much exercises - not moving around well.  Goal status: INITIAL   LONG TERM GOALS: Target date: 11/01/2022  Pt will improve FOTO to target score of 45 % to display perceived improvements in ability to complete ADL's.  Baseline: EVAL: 42 Goal status: INITIAL  2.  Pt will decrease 5TSTS by at least 5 seconds in order to demonstrate clinically significant improvement in LE strength.  Baseline: EVAL = 17.39 sec without UE support Goal status: INITIAL  3. Pt will increase by at least 0.15 m/s in order to demonstrate clinically significant improvement in community ambulation   Baseline: EVAL= 0.67 m/s Goal status: INITIAL  4.  Pt will improve BERG by at least 3 points in order to demonstrate clinically significant improvement in balance.   Baseline: EVAL: to be assessed next visit: 08/23/2022= 36/56 Goal status: INITIAL  5.  Pt will decrease TUG to below 14 seconds/decrease in order to demonstrate decreased fall risk. Baseline: EVAL: 22.00 sec with upright 4WW Goal status: INITIAL  6.  Pt will increase by at least 61m (126ft) in order to demonstrate clinically significant improvement in cardiopulmonary endurance and community ambulation  Baseline: EVAL: 3:45 min (450 feet with upright 4WW) Goal status: INITIAL 7.   Patient will report returning to walking in to community places using most appropriate assistive device vs current use of scooter for improved abilities with mobility during community outings Baseline: EVAL: Current using scooter for many social/community outings  Goal status: INITIAL ASSESSMENT:  CLINICAL IMPRESSION: Patient presented with good motivation to treatment today- responded well to circuit style  exercises- VC for breathing and specific technique for circuit style exercises. He required minimal recovery and performed sit. PT will continue to benefit from skilled PT services to improve his overall strength and mobility to maximize his independent function while decreasing his fall risk.   OBJECTIVE IMPAIRMENTS: Abnormal gait, cardiopulmonary status limiting activity, decreased activity tolerance, decreased balance, decreased coordination, decreased endurance, decreased mobility, difficulty walking, decreased ROM, decreased strength, hypomobility, impaired flexibility, impaired sensation, impaired UE functional use, postural dysfunction, obesity, and pain.   ACTIVITY LIMITATIONS: carrying, lifting, bending, standing, squatting, stairs, transfers, continence, and reach over head  PARTICIPATION LIMITATIONS: meal prep, cleaning, laundry, driving, shopping, community activity, and yard work  PERSONAL FACTORS: Age and 3+ comorbidities: HTN, Arthritis, Low back pain, Trigeminal Neuralgia  are also  affecting patient's functional outcome.   REHAB POTENTIAL: Good  CLINICAL DECISION MAKING: Evolving/moderate complexity  EVALUATION COMPLEXITY: Moderate  PLAN:  PT FREQUENCY: 1-2x/week  PT DURATION: 12 weeks  PLANNED INTERVENTIONS: Therapeutic exercises, Therapeutic activity, Neuromuscular re-education, Balance training, Gait training, Patient/Family education, Self Care, Joint mobilization, Stair training, Vestibular training, Canalith repositioning, DME instructions, Dry Needling, Electrical stimulation, Spinal manipulation, Spinal mobilization, Cryotherapy, Moist heat, Manual therapy, and Re-evaluation  PLAN FOR NEXT SESSION: Progress therex, bed mobility, and balance training.    Lenda Kelp, PT 10/01/2022, 10:18 AM  10:18 AM, 10/01/22  Physical Therapist - [facility]  Outpatient Physical Therapy- Main Campus [phone]

## 2022-10-04 ENCOUNTER — Ambulatory Visit: Payer: Medicare HMO | Admitting: Physical Therapy

## 2022-10-04 DIAGNOSIS — R2689 Other abnormalities of gait and mobility: Secondary | ICD-10-CM

## 2022-10-04 DIAGNOSIS — R2681 Unsteadiness on feet: Secondary | ICD-10-CM

## 2022-10-04 DIAGNOSIS — R262 Difficulty in walking, not elsewhere classified: Secondary | ICD-10-CM

## 2022-10-04 DIAGNOSIS — M6281 Muscle weakness (generalized): Secondary | ICD-10-CM

## 2022-10-04 DIAGNOSIS — R269 Unspecified abnormalities of gait and mobility: Secondary | ICD-10-CM

## 2022-10-04 NOTE — Therapy (Signed)
OUTPATIENT PHYSICAL THERAPY NEURO TREATMENT    Patient Name: [name] MRN: [id number] DOB:[birth year], 77 y.o., male Today's Date: 10/04/2022   PCP: Ardyth Man, PA- C REFERRING PROVIDER: Morene Crocker, MD  END OF SESSION:  PT End of Session - 10/04/22 1517     Visit Number 15    Number of Visits 24    Date for PT Re-Evaluation 11/01/22    Authorization Type [employer]    Progress Note Due on Visit 20    PT Start Time 1402    PT Stop Time 1443    PT Time Calculation (min) 41 min    Equipment Utilized During Treatment Gait belt    Activity Tolerance Patient tolerated treatment well;No increased pain;Patient limited by pain    Behavior During Therapy [facility] for tasks assessed/performed                   Past Medical History:  Diagnosis Date   Anxiety    Arteriosclerosis of coronary artery 11/16/2011   Overview:  Stent 10/2011 stent rca 2015 with collaterals to lad which is chronically occluded    Benign enlargement of prostate    Benign essential HTN 06/11/2014   Benign prostatic hypertrophy without urinary obstruction 07/31/2014   Bilateral cataracts 05/16/2013   Overview:  [facility]     Bone spur of foot    Left   BP (high blood pressure) 11/09/2012   Cancer (HCC)    skin (forehead) and bladder   Carotid artery narrowing 02/08/2014   Depression    Detrusor hypertrophy    Diabetes (HCC)    Diabetes mellitus, type 2 (HCC) 12/12/2012   Diverticulosis    Dyspnea    Esophageal reflux    Esophageal reflux    Fothergill's neuralgia 08/07/2012   Overview:  [facility]    Gastritis    GERD (gastroesophageal reflux disease)    Headache    cluster headaches   Healed myocardial infarct 11/09/2012   Hearing loss in left ear    Heart disease    Hematuria    Hemorrhoids    History of hiatal hernia 12/14/2017   small    Hypercholesteremia    Lesion of bladder    Myocardial infarct (HCC)    Presence of stent in coronary artery 11/09/2012    Rectal bleeding    Trigeminal neuralgia    Trigeminal neuralgia    Valvular heart disease    Vitamin D deficiency    Past Surgical History:  Procedure Laterality Date   APPENDECTOMY     BOTOX INJECTION N/A 12/21/2017   Procedure: Bladder BOTOX INJECTION;  Surgeon: Vanna Scotland, MD;  Location: [facility] ORS;  Service: Urology;  Laterality: N/A;   BOTOX INJECTION N/A 09/11/2018   Procedure: Bladder BOTOX INJECTION;  Surgeon: Vanna Scotland, MD;  Location: [facility] ORS;  Service: Urology;  Laterality: N/A;   CARDIAC CATHETERIZATION     CARDIAC CATHETERIZATION N/A 01/22/2015   Procedure: Left Heart Cath;  Surgeon: Lamar Blinks, MD;  Location: [facility] INVASIVE CV LAB;  Service: Cardiovascular;  Laterality: N/A;   CARDIAC CATHETERIZATION N/A 01/22/2015   Procedure: Coronary Stent Intervention;  Surgeon: Marcina Millard, MD;  Location: [facility] INVASIVE CV LAB;  Service: Cardiovascular;  Laterality: N/A;   CATARACT EXTRACTION, BILATERAL     COLONOSCOPY WITH PROPOFOL N/A 12/08/2015   Procedure: COLONOSCOPY WITH PROPOFOL;  Surgeon: Christena Deem, MD;  Location: [facility] ENDOSCOPY;  Service: Endoscopy;  Laterality: N/A;   COLONOSCOPY WITH  PROPOFOL N/A 12/09/2015   Procedure: COLONOSCOPY WITH PROPOFOL;  Surgeon: Christena Deem, MD;  Location: [facility] ENDOSCOPY;  Service: Endoscopy;  Laterality: N/A;   CORONARY ANGIOPLASTY     5 stents   CORONARY STENT PLACEMENT  2015   x5   CYSTOSCOPY N/A 09/11/2018   Procedure: CYSTOSCOPY;  Surgeon: Vanna Scotland, MD;  Location: [facility] ORS;  Service: Urology;  Laterality: N/A;   CYSTOSCOPY WITH BIOPSY N/A 12/21/2017   Procedure: CYSTOSCOPY WITH BIOPSY;  Surgeon: Vanna Scotland, MD;  Location: [facility] ORS;  Service: Urology;  Laterality: N/A;   ESOPHAGOGASTRODUODENOSCOPY (EGD) WITH PROPOFOL N/A 12/08/2015   Procedure: ESOPHAGOGASTRODUODENOSCOPY (EGD) WITH PROPOFOL;  Surgeon: Christena Deem, MD;  Location[facility] ENDOSCOPY;  Service: Endoscopy;  Laterality: N/A;    ESOPHAGOGASTRODUODENOSCOPY (EGD) WITH PROPOFOL N/A 12/13/2017   Procedure: ESOPHAGOGASTRODUODENOSCOPY (EGD) WITH PROPOFOL;  Surgeon: Christena Deem, MD;  Location: [facility] ENDOSCOPY;  Service: Endoscopy;  Laterality: N/A;   EYE SURGERY     HERNIA REPAIR     kidney tumor remove     TRANSURETHRAL RESECTION OF BLADDER TUMOR WITH GYRUS (TURBT-GYRUS)  12/2013   UMBILICAL HERNIA REPAIR     urethral meatotomy     Patient Active Problem List   Diagnosis Date Noted   Class 2 obesity due to excess calories with body mass index (BMI) of 36.0 to 36.9 in adult 04/11/2020   Swelling of limb 03/28/2020   Lymphedema 03/28/2020   Trigeminal neuralgia 12/25/2019   Lower limb ulcer, calf, left, limited to breakdown of skin (HCC) 12/25/2019   OSA (obstructive sleep apnea) 01/23/2019   Acute systolic CHF (congestive heart failure) (HCC) 12/12/2018   Bruising 07/10/2018   Diet-controlled type 2 diabetes mellitus (HCC) 03/24/2018   Microalbuminuria 03/24/2018   Dizziness 11/17/2017   Simple chronic bronchitis (HCC) 09/22/2017   SOBOE (shortness of breath on exertion) 02/18/2017   Chronic midline low back pain without sciatica 12/29/2016   Periodic limb movement disorder 12/29/2016   Benign essential tremor 06/22/2016   Pure hypercholesterolemia 06/20/2015   Erectile dysfunction due to arterial insufficiency 06/16/2015   Unstable angina (HCC) 01/22/2015   Abdominal aortic 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 rupture (HCC) 12/31/2014   Benign prostatic hypertrophy without urinary obstruction 07/31/2014   Enlarged prostate 07/31/2014   Benign essential HTN 06/11/2014   Carotid artery narrowing 02/08/2014   Carotid artery obstruction 02/08/2014   Bilateral carotid artery stenosis 02/08/2014   Chest pain 08/20/2013   Bilateral cataracts 05/16/2013   Cataract 05/16/2013   Clinical depression 12/12/2012   Diabetes mellitus, type 2 (HCC) 12/12/2012   Combined fat and carbohydrate induced hyperlipemia 12/12/2012    Major depressive disorder, single episode, unspecified 12/12/2012   Acid reflux 11/09/2012   Presence of stent in coronary artery 11/09/2012   BP (high blood pressure) 11/09/2012   Healed myocardial infarct 11/09/2012   Gastro-esophageal reflux disease without esophagitis 11/09/2012   History of cardiovascular surgery 11/09/2012   Fothergill's neuralgia 08/07/2012   Swelling of testicle 04/06/2012   Disorder of male genital organ 04/06/2012   Swelling of the testicles 04/06/2012   Arteriosclerosis of coronary artery 11/16/2011   CAD in native artery 11/16/2011   Fatigue 06/04/2011   Avitaminosis D 11/30/2010    ONSET DATE: 03/17/2022- Worsening symptoms  REFERRING DIAG:  G20.A1 (ICD-10-CM) - Parkinsons  R26.2 (ICD-10-CM) - Difficulty walking    THERAPY DIAG:  Difficulty in walking, not elsewhere classified  Muscle weakness (generalized)  Abnormality of gait and mobility  Unsteadiness on feet  Other abnormalities of  gait and mobility  Rationale for Evaluation and Treatment: Rehabilitation  SUBJECTIVE:                                                                                                                                                                                             SUBJECTIVE STATEMENT: Pt reports doing well today. Pt denies any recent falls/stumbles since prior session. Pt denies any updates to medications or medical appointment since prior session. Pt reports good compliance with HEP when time permits.    Patient reports 0/10  Pt accompanied by: significant other  PERTINENT HISTORY: Patient is a 77 year old male with referral to outpatient PT for Parkinsons and difficulty walking. He was recently seen by Neurology worsening symptoms of parkinson- difficulty with walking. He has past medical history of Arthritis, Bladder cancer, and Trigeminal Neuralgia.  PAIN:  Are you having pain? NO  PRECAUTIONS: Fall  WEIGHT BEARING RESTRICTIONS:  No  FALLS: Has patient fallen in last 6 months? Yes. Number of falls 1  LIVING ENVIRONMENT: Lives with: lives with their spouse Lives in: House/apartment Stairs: Yes: External: 2 steps; has grab bar Has following equipment at home: Dan Humphreys - 2 wheeled, Environmental consultant - 4 wheeled, and Grab bars  PLOF: Independent with household mobility with device, Independent with community mobility with device, Independent with transfers, and Requires assistive device for independence  PATIENT GOALS: Get to where I can walk better again and get my legs stronger.   OBJECTIVE:    TODAY'S TREATMENT:                                                                                                                              DATE: 10/04/22   Therex:    Unless otherwise stated, CGA was provided and gait belt donned in order to ensure pt safety  Circuit style workout :   3 rounds of:  Resistive gait with 4WW- 3# AW x 150 feet  Seated running man- Alt UE/LE (hip march with UE shoulder flex/ext) x 10 Seated scap retraction GTB x 10  3 rounds: Resistive  gait with 4WW 3# AW x 150 ft  Seated PWR! Up x 10  Sit to stand x 10 with cues for forward weight shift   2 x 15 reps of heel raises in standing         PATIENT EDUCATION: Education details: Plan of care; purpose of functional outcome measures.  Person educated: Patient Education method: Explanation, Demonstration, Tactile cues, Verbal cues, and Handouts Education comprehension: verbalized understanding, returned demonstration, verbal cues required, tactile cues required, and needs further education  HOME EXERCISE PROGRAM: Access Code: [email or web address] URL: [email or web address] Date: 08/11/2022 Prepared by: Maureen Ralphs  Exercises - Seated Lumbar Flexion Stretch  - 7 x weekly - 3 sets - 30 sec hold - Seated Thoracic Flexion and Rotation with Arms Crossed  - 1 x daily - 7 x weekly - 3 sets - 30 sec hold - Seated Figure 4 Piriformis  Stretch  - 1 x daily - 7 x weekly - 3 sets - 10 reps  GOALS: Goals reviewed with patient? Yes  SHORT TERM GOALS: Target date: 09/20/2022  Pt will be independent with a comprehensive HEP in order to improve strength and balance in order to decrease fall risk and improve function at home and work.  Baseline: EVAL: Patient admits not performing much exercises - not moving around well.  Goal status: INITIAL   LONG TERM GOALS: Target date: 11/01/2022  Pt will improve FOTO to target score of 45 % to display perceived improvements in ability to complete ADL's.  Baseline: EVAL: 42 Goal status: INITIAL  2.  Pt will decrease 5TSTS by at least 5 seconds in order to demonstrate clinically significant improvement in LE strength.  Baseline: EVAL = 17.39 sec without UE support Goal status: INITIAL  3. Pt will increase by at least 0.15 m/s in order to demonstrate clinically significant improvement in community ambulation   Baseline: EVAL= 0.67 m/s Goal status: INITIAL  4.  Pt will improve BERG by at least 3 points in order to demonstrate clinically significant improvement in balance.   Baseline: EVAL: to be assessed next visit: 08/23/2022= 36/56 Goal status: INITIAL  5.  Pt will decrease TUG to below 14 seconds/decrease in order to demonstrate decreased fall risk. Baseline: EVAL: 22.00 sec with upright 4WW Goal status: INITIAL  6.  Pt will increase by at least 50m (126ft) in order to demonstrate clinically significant improvement in cardiopulmonary endurance and community ambulation  Baseline: EVAL: 3:45 min (450 feet with upright 4WW) Goal status: INITIAL 7.   Patient will report returning to walking in to community places using most appropriate assistive device vs current use of scooter for improved abilities with mobility during community outings Baseline: EVAL: Current using scooter for many social/community outings  Goal status: INITIAL ASSESSMENT:  CLINICAL IMPRESSION: Patient  presented with good motivation to treatment today- responded well to circuit style exercises. Pt showing good ambulation speed with some fatigue as session progressed. Altered PWR! Up in sitting to adjust for pt shoulder pt and following this pt had no issues with activity. Pt also responded well with improved STS form and speed following exercise.  PT will continue to benefit from skilled PT services to improve his overall strength and mobility to maximize his independent function while decreasing his fall risk.   OBJECTIVE IMPAIRMENTS: Abnormal gait, cardiopulmonary status limiting activity, decreased activity tolerance, decreased balance, decreased coordination, decreased endurance, decreased mobility, difficulty walking, decreased ROM, decreased strength, hypomobility, impaired flexibility, impaired sensation, impaired UE functional use, postural dysfunction,  obesity, and pain.   ACTIVITY LIMITATIONS: carrying, lifting, bending, standing, squatting, stairs, transfers, continence, and reach over head  PARTICIPATION LIMITATIONS: meal prep, cleaning, laundry, driving, shopping, community activity, and yard work  PERSONAL FACTORS: Age and 3+ comorbidities: HTN, Arthritis, Low back pain, Trigeminal Neuralgia  are also affecting patient's functional outcome.   REHAB POTENTIAL: Good  CLINICAL DECISION MAKING: Evolving/moderate complexity  EVALUATION COMPLEXITY: Moderate  PLAN:  PT FREQUENCY: 1-2x/week  PT DURATION: 12 weeks  PLANNED INTERVENTIONS: Therapeutic exercises, Therapeutic activity, Neuromuscular re-education, Balance training, Gait training, Patient/Family education, Self Care, Joint mobilization, Stair training, Vestibular training, Canalith repositioning, DME instructions, Dry Needling, Electrical stimulation, Spinal manipulation, Spinal mobilization, Cryotherapy, Moist heat, Manual therapy, and Re-evaluation  PLAN FOR NEXT SESSION: Progress therex, bed mobility, and balance  training.    Norman Herrlich, PT 10/04/2022, 3:20 PM  3:20 PM, 10/04/22  Physical Therapist - [facility]  Outpatient Physical Therapy- Main Campus [phone]

## 2022-10-05 ENCOUNTER — Other Ambulatory Visit: Payer: Medicare HMO | Admitting: Urology

## 2022-10-05 ENCOUNTER — Ambulatory Visit: Payer: Medicare HMO

## 2022-10-07 ENCOUNTER — Ambulatory Visit: Payer: Medicare HMO

## 2022-10-11 ENCOUNTER — Ambulatory Visit: Payer: Medicare HMO | Admitting: Dermatology

## 2022-10-11 ENCOUNTER — Encounter: Payer: Self-pay | Admitting: Dermatology

## 2022-10-11 DIAGNOSIS — L219 Seborrheic dermatitis, unspecified: Secondary | ICD-10-CM

## 2022-10-11 DIAGNOSIS — L570 Actinic keratosis: Secondary | ICD-10-CM | POA: Diagnosis not present

## 2022-10-11 DIAGNOSIS — W908XXA Exposure to other nonionizing radiation, initial encounter: Secondary | ICD-10-CM | POA: Diagnosis not present

## 2022-10-11 DIAGNOSIS — L578 Other skin changes due to chronic exposure to nonionizing radiation: Secondary | ICD-10-CM

## 2022-10-11 MED ORDER — FLUOCINOLONE ACETONIDE 0.01 % OT OIL: TOPICAL_OIL | OTIC | 2 refills | Status: AC

## 2022-10-11 MED ORDER — PIMECROLIMUS 1 % EX CREA: TOPICAL_CREAM | CUTANEOUS | 2 refills | Status: AC

## 2022-10-11 NOTE — Patient Instructions (Addendum)
Cryotherapy Aftercare  Wash gently with soap and water everyday.   Apply Vaseline Jelly daily until healed.    Start Pimecrolimus cream once daily to face, neck and ears. (Use morning OR night alternating with the Ketoconazole) Continue Ketoconazole cream once daily to face, neck and ears. Continue Ketoconazole 2% shampoo on scalp, ears and face as directed.  Start Derm-Otic/Fluocinolone oil once or twice daily to ears  Topical steroids (such as triamcinolone, fluocinolone, fluocinonide, mometasone, clobetasol, halobetasol, betamethasone, hydrocortisone) can cause thinning and lightening of the skin if they are used for too long in the same area. Your physician has selected the right strength medicine for your problem and area affected on the body. Please use your medication only as directed by your physician to prevent side effects.    Due to recent changes in healthcare laws, you may see results of your pathology and/or laboratory studies on MyChart before the doctors have had a chance to review them. We understand that in some cases there may be results that are confusing or concerning to you. Please understand that not all results are received at the same time and often the doctors may need to interpret multiple results in order to provide you with the best plan of care or course of treatment. Therefore, we ask that you please give [place] 2 business days to thoroughly review all your results before contacting the office for clarification. Should we see a critical lab result, you will be contacted sooner.   If You Need Anything After Your Visit  If you have any questions or concerns for your doctor, please call our main line at [phone] and press option 4 to reach your doctor's medical assistant. If no one answers, please leave a voicemail as directed and we will return your call as soon as possible. Messages left after 4 pm will be answered the following business day.   You may also send [place]  a message via MyChart. We typically respond to MyChart messages within 1-2 business days.  For prescription refills, please ask your pharmacy to contact our office. Our fax number is [phone].  If you have an urgent issue when the clinic is closed that cannot wait until the next business day, you can page your doctor at the number below.    Please note that while we do our best to be available for urgent issues outside of office hours, we are not available 24/7.   If you have an urgent issue and are unable to reach [place], you may choose to seek medical care at your doctor's office, retail clinic, urgent care center, or emergency room.  If you have a medical emergency, please immediately call 911 or go to the emergency department.  Pager Numbers  - Dr. Gwen Pounds: [phone]  - Dr. Roseanne Reno: [phone]  - Dr. Katrinka Blazing: [phone]   In the event of inclement weather, please call our main line at [phone] for an update on the status of any delays or closures.  Dermatology Medication Tips: Please keep the boxes that topical medications come in in order to help keep track of the instructions about where and how to use these. Pharmacies typically print the medication instructions only on the boxes and not directly on the medication tubes.   If your medication is too expensive, please contact our office at [phone] option 4 or send [place] a message through MyChart.   We are unable to tell what your co-pay for medications will be in advance as this is different  depending on your insurance coverage. However, we may be able to find a substitute medication at lower cost or fill out paperwork to get insurance to cover a needed medication.   If a prior authorization is required to get your medication covered by your insurance company, please allow [place] 1-2 business days to complete this process.  Drug prices often vary depending on where the prescription is filled and some pharmacies may offer  cheaper prices.  The website www.goodrx.com contains coupons for medications through different pharmacies. The prices here do not account for what the cost may be with help from insurance (it may be cheaper with your insurance), but the website can give you the price if you did not use any insurance.  - You can print the associated coupon and take it with your prescription to the pharmacy.  - You may also stop by our office during regular business hours and pick up a GoodRx coupon card.  - If you need your prescription sent electronically to a different pharmacy, notify our office through [facility] or by phone at [phone] option 4.     Si Usted Necesita Algo Despus de Su Visita  Tambin puede enviarnos un mensaje a travs de Clinical cytogeneticist. Por lo general respondemos a los mensajes de MyChart en el transcurso de 1 a 2 das hbiles.  Para renovar recetas, por favor pida a su farmacia que se ponga en contacto con nuestra oficina. Annie Sable de fax es [place] [phone].  Si tiene un asunto urgente cuando la clnica est cerrada y que no puede esperar hasta el siguiente da hbil, puede llamar/localizar a su doctor(a) al nmero que aparece a continuacin.   Por favor, tenga en cuenta que aunque hacemos todo lo posible para estar disponibles para asuntos urgentes fuera del horario de [place], no estamos disponibles las 24 horas del da, los 7 [address]  [address] de la [place].   Si tiene un problema urgente y no puede comunicarse con nosotros, puede optar por buscar atencin mdica  en el consultorio de su doctor(a), en una clnica privada, en un centro de atencin urgente o en una sala de emergencias.  Si tiene Engineer, drilling, por favor llame inmediatamente al 911 o vaya a la sala de emergencias.  Nmeros de bper  - Dr. Gwen Pounds: [phone]  - Dra. Roseanne Reno: [phone]  - Dr. Katrinka Blazing: [phone]   En caso de inclemencias del tiempo, por favor llame a Lacy Duverney principal al  [phone] para una actualizacin sobre el [place] de cualquier retraso o cierre.  Consejos para la medicacin en dermatologa: Por favor, guarde las cajas en las que vienen los medicamentos de uso tpico para ayudarle a seguir las instrucciones sobre dnde y cmo usarlos. Las farmacias generalmente imprimen las instrucciones del medicamento slo en las cajas y no directamente en los tubos del [place].   Si su medicamento es muy caro, por favor, pngase en contacto con Rolm Gala llamando al [phone] y presione la opcin 4 o envenos un mensaje a travs de Clinical cytogeneticist.   No podemos decirle cul ser su copago por los medicamentos por adelantado ya que esto es diferente dependiendo de la cobertura de su seguro. Sin embargo, es posible que podamos encontrar un medicamento sustituto a Audiological scientist un formulario para que el seguro cubra el medicamento que se considera necesario.   Si se requiere una autorizacin previa para que su compaa de seguros [place] su medicamento, por favor permtanos de 1 a 2 das hbiles para completar  este proceso.  Los precios de los medicamentos varan con frecuencia dependiendo del Environmental consultant de dnde se surte la receta y alguna farmacias pueden ofrecer precios ms baratos.  El sitio web www.goodrx.com tiene cupones para medicamentos de Health and safety inspector. Los precios aqu no tienen en cuenta lo que podra costar con la ayuda del seguro (puede ser ms barato con su seguro), pero el sitio web puede darle el precio si no utiliz Tourist information centre manager.  - Puede imprimir el cupn correspondiente y llevarlo con su receta a la farmacia.  - Tambin puede pasar por nuestra oficina durante el horario de atencin regular y Education officer, museum una tarjeta de cupones de GoodRx.  - Si necesita que su receta se enve electrnicamente a una farmacia diferente, informe a nuestra oficina a travs de MyChart de [place] o por telfono llamando al [phone] y presione la opcin 4.

## 2022-10-11 NOTE — Progress Notes (Signed)
Follow-Up Visit   Subjective  [name] is a 77 y.o. male who presents for the following: Seborrheic dermatitis. 2 month follow up. Using HC 2.5% cream and Ketoconazole 2% cream as directed. States helps while using but comes back. Some days are worse than others. Using Ketoconazole every day.   Check arms. C/O multiple precancerous areas. Pink, scaly, irritated.   Wife is with patient and contributes to history.   The following portions of the chart were reviewed this encounter and updated as appropriate: medications, allergies, medical history  Review of Systems:  No other skin or systemic complaints except as noted in HPI or Assessment and Plan.  Objective  Well appearing patient in no apparent distress; mood and affect are within normal limits.  Areas Examined: Scalp, ears and face  Relevant physical exam findings are noted in the Assessment and Plan.  Left forearm x4, right forearm x8 (12) Erythematous thin papules/macules with gritty scale.          Assessment & Plan   AK (actinic keratosis) (12) Left forearm x4, right forearm x8  Actinic keratoses are precancerous spots that appear secondary to cumulative UV radiation exposure/sun exposure over time. They are chronic with expected duration over 1 year. A portion of actinic keratoses will progress to squamous cell carcinoma of the skin. It is not possible to reliably predict which spots will progress to skin cancer and so treatment is recommended to prevent development of skin cancer.  Recommend daily broad spectrum sunscreen SPF 30+ to sun-exposed areas, reapply every 2 hours as needed.  Recommend staying in the shade or wearing long sleeves, sun glasses (UVA+UVB protection) and wide brim hats (4-inch brim around the entire circumference of the hat). Call for new or changing lesions.  Destruction of lesion - Left forearm x4, right forearm x8 (12)  Destruction method: cryotherapy   Informed consent:  discussed and consent obtained   Lesion destroyed using liquid nitrogen: Yes   Region frozen until ice ball extended beyond lesion: Yes   Outcome: patient tolerated procedure well with no complications   Post-procedure details: wound care instructions given   Additional details:  Prior to procedure, discussed risks of blister formation, small wound, skin dyspigmentation, or rare scar following cryotherapy. Recommend Vaseline ointment to treated areas while healing.   SEBORRHEIC DERMATITIS Exam: extensive pink scaly patches on face, ears, neck  Chronic and persistent condition with duration or expected duration over one year. Condition is symptomatic/ bothersome to patient. Not currently at goal. Severe involvement.  Pt has underlying neurologic disease (Parkinson's)   Seborrheic Dermatitis  -  is a chronic persistent rash characterized by pinkness and scaling most commonly of the mid face but also can occur on the scalp (dandruff), ears; mid chest, mid back and groin.  It tends to be exacerbated by stress and cooler weather.  People who have neurologic disease may experience new onset or exacerbation of existing seborrheic dermatitis.  The condition is not curable but treatable and can be controlled.  Treatment Plan: Start Pimecrolimus cream once daily to face, neck and ears.  Continue Ketoconazole cream once daily to face, neck and ears. Continue Ketoconazole 2% shampoo on scalp, ears and face as directed.  Start Derm-Otic/Fluocinolone oil once or twice daily to ears  ACTINIC DAMAGE - chronic, secondary to cumulative UV radiation exposure/sun exposure over time - diffuse scaly erythematous macules with underlying dyspigmentation - Recommend daily broad spectrum sunscreen SPF 30+ to sun-exposed areas, reapply every 2 hours as  needed.  - Recommend staying in the shade or wearing long sleeves, sun glasses (UVA+UVB protection) and wide brim hats (4-inch brim around the entire circumference of  the hat). - Call for new or changing lesions.   Return for Seborrheic Dermatitis Follow Up in 4-6 weeks.  I, Lawson Radar, CMA, am acting as scribe for Willeen Niece, MD.   Documentation: I have reviewed the above documentation for accuracy and completeness, and I agree with the above.  Willeen Niece, MD

## 2022-10-12 ENCOUNTER — Ambulatory Visit: Payer: Medicare HMO

## 2022-10-12 DIAGNOSIS — R269 Unspecified abnormalities of gait and mobility: Secondary | ICD-10-CM

## 2022-10-12 DIAGNOSIS — R2689 Other abnormalities of gait and mobility: Secondary | ICD-10-CM

## 2022-10-12 DIAGNOSIS — M6281 Muscle weakness (generalized): Secondary | ICD-10-CM

## 2022-10-12 DIAGNOSIS — R278 Other lack of coordination: Secondary | ICD-10-CM

## 2022-10-12 DIAGNOSIS — R262 Difficulty in walking, not elsewhere classified: Secondary | ICD-10-CM

## 2022-10-12 DIAGNOSIS — M545 Low back pain, unspecified: Secondary | ICD-10-CM

## 2022-10-12 DIAGNOSIS — R2681 Unsteadiness on feet: Secondary | ICD-10-CM

## 2022-10-12 NOTE — Therapy (Signed)
OUTPATIENT PHYSICAL THERAPY NEURO TREATMENT    Patient Name: [name] MRN: [id number] DOB:[birth year], 77 y.o., male Today's Date: 10/12/2022   PCP: Ardyth Man, PA- C REFERRING PROVIDER: Morene Crocker, MD  END OF SESSION:  PT End of Session - 10/12/22 0807     Visit Number 16    Number of Visits 24    Date for PT Re-Evaluation 11/01/22    Authorization Type [employer]    Progress Note Due on Visit 20    PT Start Time 0802    Equipment Utilized During Treatment Gait belt    Activity Tolerance Patient tolerated treatment well;No increased pain;Patient limited by pain    Behavior During Therapy [facility] for tasks assessed/performed                   Past Medical History:  Diagnosis Date   Anxiety    Arteriosclerosis of coronary artery 11/16/2011   Overview:  Stent 10/2011 stent rca 2015 with collaterals to lad which is chronically occluded    Benign enlargement of prostate    Benign essential HTN 06/11/2014   Benign prostatic hypertrophy without urinary obstruction 07/31/2014   Bilateral cataracts 05/16/2013   Overview:  [facility]     Bone spur of foot    Left   BP (high blood pressure) 11/09/2012   Cancer (HCC)    skin (forehead) and bladder   Carotid artery narrowing 02/08/2014   Depression    Detrusor hypertrophy    Diabetes (HCC)    Diabetes mellitus, type 2 (HCC) 12/12/2012   Diverticulosis    Dyspnea    Esophageal reflux    Esophageal reflux    Fothergill's neuralgia 08/07/2012   Overview:  [facility]    Gastritis    GERD (gastroesophageal reflux disease)    Headache    cluster headaches   Healed myocardial infarct 11/09/2012   Hearing loss in left ear    Heart disease    Hematuria    Hemorrhoids    History of hiatal hernia 12/14/2017   small    Hypercholesteremia    Lesion of bladder    Myocardial infarct (HCC)    Presence of stent in coronary artery 11/09/2012   Rectal bleeding    Trigeminal neuralgia     Trigeminal neuralgia    Valvular heart disease    Vitamin D deficiency    Past Surgical History:  Procedure Laterality Date   APPENDECTOMY     BOTOX INJECTION N/A 12/21/2017   Procedure: Bladder BOTOX INJECTION;  Surgeon: Vanna Scotland, MD;  Location: [facility] ORS;  Service: Urology;  Laterality: N/A;   BOTOX INJECTION N/A 09/11/2018   Procedure: Bladder BOTOX INJECTION;  Surgeon: Vanna Scotland, MD;  Location: [facility] ORS;  Service: Urology;  Laterality: N/A;   CARDIAC CATHETERIZATION     CARDIAC CATHETERIZATION N/A 01/22/2015   Procedure: Left Heart Cath;  Surgeon: Lamar Blinks, MD;  Location: [facility] INVASIVE CV LAB;  Service: Cardiovascular;  Laterality: N/A;   CARDIAC CATHETERIZATION N/A 01/22/2015   Procedure: Coronary Stent Intervention;  Surgeon: Marcina Millard, MD;  Location: [facility] INVASIVE CV LAB;  Service: Cardiovascular;  Laterality: N/A;   CATARACT EXTRACTION, BILATERAL     COLONOSCOPY WITH PROPOFOL N/A 12/08/2015   Procedure: COLONOSCOPY WITH PROPOFOL;  Surgeon: Christena Deem, MD;  Location: [facility] ENDOSCOPY;  Service: Endoscopy;  Laterality: N/A;   COLONOSCOPY WITH PROPOFOL N/A 12/09/2015   Procedure: COLONOSCOPY WITH PROPOFOL;  Surgeon: Christena Deem, MD;  Location: [facility] ENDOSCOPY;  Service: Endoscopy;  Laterality: N/A;   CORONARY ANGIOPLASTY     5 stents   CORONARY STENT PLACEMENT  2015   x5   CYSTOSCOPY N/A 09/11/2018   Procedure: CYSTOSCOPY;  Surgeon: Vanna Scotland, MD;  Location: [facility] ORS;  Service: Urology;  Laterality: N/A;   CYSTOSCOPY WITH BIOPSY N/A 12/21/2017   Procedure: CYSTOSCOPY WITH BIOPSY;  Surgeon: Vanna Scotland, MD;  Location: [facility] ORS;  Service: Urology;  Laterality: N/A;   ESOPHAGOGASTRODUODENOSCOPY (EGD) WITH PROPOFOL N/A 12/08/2015   Procedure: ESOPHAGOGASTRODUODENOSCOPY (EGD) WITH PROPOFOL;  Surgeon: Christena Deem, MD;  Location: [facility] ENDOSCOPY;  Service: Endoscopy;  Laterality: N/A;   ESOPHAGOGASTRODUODENOSCOPY (EGD) WITH PROPOFOL N/A  12/13/2017   Procedure: ESOPHAGOGASTRODUODENOSCOPY (EGD) WITH PROPOFOL;  Surgeon: Christena Deem, MD;  Location: [facility] ENDOSCOPY;  Service: Endoscopy;  Laterality: N/A;   EYE SURGERY     HERNIA REPAIR     kidney tumor remove     TRANSURETHRAL RESECTION OF BLADDER TUMOR WITH GYRUS (TURBT-GYRUS)  12/2013   UMBILICAL HERNIA REPAIR     urethral meatotomy     Patient Active Problem List   Diagnosis Date Noted   Class 2 obesity due to excess calories with body mass index (BMI) of 36.0 to 36.9 in adult 04/11/2020   Swelling of limb 03/28/2020   Lymphedema 03/28/2020   Trigeminal neuralgia 12/25/2019   Lower limb ulcer, calf, left, limited to breakdown of skin (HCC) 12/25/2019   OSA (obstructive sleep apnea) 01/23/2019   Acute systolic CHF (congestive heart failure) (HCC) 12/12/2018   Bruising 07/10/2018   Diet-controlled type 2 diabetes mellitus (HCC) 03/24/2018   Microalbuminuria 03/24/2018   Dizziness 11/17/2017   Simple chronic bronchitis (HCC) 09/22/2017   SOBOE (shortness of breath on exertion) 02/18/2017   Chronic midline low back pain without sciatica 12/29/2016   Periodic limb movement disorder 12/29/2016   Benign essential tremor 06/22/2016   Pure hypercholesterolemia 06/20/2015   Erectile dysfunction due to arterial insufficiency 06/16/2015   Unstable angina (HCC) 01/22/2015   Abdominal aortic 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 rupture (HCC) 12/31/2014   Benign prostatic hypertrophy without urinary obstruction 07/31/2014   Enlarged prostate 07/31/2014   Benign essential HTN 06/11/2014   Carotid artery narrowing 02/08/2014   Carotid artery obstruction 02/08/2014   Bilateral carotid artery stenosis 02/08/2014   Chest pain 08/20/2013   Bilateral cataracts 05/16/2013   Cataract 05/16/2013   Clinical depression 12/12/2012   Diabetes mellitus, type 2 (HCC) 12/12/2012   Combined fat and carbohydrate induced hyperlipemia 12/12/2012   Major depressive disorder, single episode, unspecified  12/12/2012   Acid reflux 11/09/2012   Presence of stent in coronary artery 11/09/2012   BP (high blood pressure) 11/09/2012   Healed myocardial infarct 11/09/2012   Gastro-esophageal reflux disease without esophagitis 11/09/2012   History of cardiovascular surgery 11/09/2012   Fothergill's neuralgia 08/07/2012   Swelling of testicle 04/06/2012   Disorder of male genital organ 04/06/2012   Swelling of the testicles 04/06/2012   Arteriosclerosis of coronary artery 11/16/2011   CAD in native artery 11/16/2011   Fatigue 06/04/2011   Avitaminosis D 11/30/2010    ONSET DATE: 03/17/2022- Worsening symptoms  REFERRING DIAG:  G20.A1 (ICD-10-CM) - Parkinsons  R26.2 (ICD-10-CM) - Difficulty walking    THERAPY DIAG:  Difficulty in walking, not elsewhere classified  Muscle weakness (generalized)  Abnormality of gait and mobility  Unsteadiness on feet  Other abnormalities of gait and mobility  Other lack of coordination  Chronic bilateral low back pain without sciatica  Rationale for Evaluation and Treatment: Rehabilitation  SUBJECTIVE:                                                                                                                                                                                             SUBJECTIVE STATEMENT: Pt reports doing well today. Pt denies any recent falls/stumbles since prior session. Pt denies any updates to medications or medical appointment since prior session. Pt reports good compliance with HEP when time permits.    Patient reports 0/10  Pt accompanied by: significant other  PERTINENT HISTORY: Patient is a 77 year old male with referral to outpatient PT for Parkinsons and difficulty walking. He was recently seen by Neurology worsening symptoms of parkinson- difficulty with walking. He has past medical history of Arthritis, Bladder cancer, and Trigeminal Neuralgia.  PAIN:  Are you having pain? NO  PRECAUTIONS: Fall  WEIGHT  BEARING RESTRICTIONS: No  FALLS: Has patient fallen in last 6 months? Yes. Number of falls 1  LIVING ENVIRONMENT: Lives with: lives with their spouse Lives in: House/apartment Stairs: Yes: External: 2 steps; has grab bar Has following equipment at home: Dan Humphreys - 2 wheeled, Environmental consultant - 4 wheeled, and Grab bars  PLOF: Independent with household mobility with device, Independent with community mobility with device, Independent with transfers, and Requires assistive device for independence  PATIENT GOALS: Get to where I can walk better again and get my legs stronger.   OBJECTIVE:    TODAY'S TREATMENT:                                                                                                                              DATE: 10/12/22   Therex:    Unless otherwise stated, CGA was provided and gait belt donned in order to ensure pt safety  Nustep -interval- LE only- L0 x 1 min, L2 x 1 min, L4 x 1 min, L1 x 1 min, L3 x 1 min  Circuit style workout :   3 rounds of:  Resistive gait with 4WW- 4# AW x 175 feet  Seated scap  retraction BTB x 20 Seated Step tap onto 6" block 4# AW Each LE x 20 reps   1 round: Resistive gait with 4WW 3# AW x 175 ft  Seated running man- Alt UE/LE (hip march with UE shoulder flex/ext) x 10 Sit to stand x 10 without UE support.           PATIENT EDUCATION: Education details: Plan of care; purpose of functional outcome measures.  Person educated: Patient Education method: Explanation, Demonstration, Tactile cues, Verbal cues, and Handouts Education comprehension: verbalized understanding, returned demonstration, verbal cues required, tactile cues required, and needs further education  HOME EXERCISE PROGRAM: Access Code: [email or web address] URL: [email or web address] Date: 08/11/2022 Prepared by: Maureen Ralphs  Exercises - Seated Lumbar Flexion Stretch  - 7 x weekly - 3 sets - 30 sec hold - Seated Thoracic Flexion and Rotation with  Arms Crossed  - 1 x daily - 7 x weekly - 3 sets - 30 sec hold - Seated Figure 4 Piriformis Stretch  - 1 x daily - 7 x weekly - 3 sets - 10 reps  GOALS: Goals reviewed with patient? Yes  SHORT TERM GOALS: Target date: 09/20/2022  Pt will be independent with a comprehensive HEP in order to improve strength and balance in order to decrease fall risk and improve function at home and work.  Baseline: EVAL: Patient admits not performing much exercises - not moving around well.  Goal status: INITIAL   LONG TERM GOALS: Target date: 11/01/2022  Pt will improve FOTO to target score of 45 % to display perceived improvements in ability to complete ADL's.  Baseline: EVAL: 42 Goal status: INITIAL  2.  Pt will decrease 5TSTS by at least 5 seconds in order to demonstrate clinically significant improvement in LE strength.  Baseline: EVAL = 17.39 sec without UE support Goal status: INITIAL  3. Pt will increase by at least 0.15 m/s in order to demonstrate clinically significant improvement in community ambulation   Baseline: EVAL= 0.67 m/s Goal status: INITIAL  4.  Pt will improve BERG by at least 3 points in order to demonstrate clinically significant improvement in balance.   Baseline: EVAL: to be assessed next visit: 08/23/2022= 36/56 Goal status: INITIAL  5.  Pt will decrease TUG to below 14 seconds/decrease in order to demonstrate decreased fall risk. Baseline: EVAL: 22.00 sec with upright 4WW Goal status: INITIAL  6.  Pt will increase by at least 57m (149ft) in order to demonstrate clinically significant improvement in cardiopulmonary endurance and community ambulation  Baseline: EVAL: 3:45 min (450 feet with upright 4WW) Goal status: INITIAL 7.   Patient will report returning to walking in to community places using most appropriate assistive device vs current use of scooter for improved abilities with mobility during community outings Baseline: EVAL: Current using scooter for many  social/community outings  Goal status: INITIAL ASSESSMENT:  CLINICAL IMPRESSION: Patient performance mildly limited by right hip pain initially- slower start and more rest but improved during session- limited at end of session only by fatigue- Reporting his legs felt like "jelly." He continued to respond to circuit style workout with minimal rest break overall- able to increase gait distance in circuit with slight increase resistance. PT will continue to benefit from skilled PT services to improve his overall strength and mobility to maximize his independent function while decreasing his fall risk.   OBJECTIVE IMPAIRMENTS: Abnormal gait, cardiopulmonary status limiting activity, decreased activity tolerance, decreased balance, decreased coordination, decreased endurance, decreased mobility, difficulty walking,  decreased ROM, decreased strength, hypomobility, impaired flexibility, impaired sensation, impaired UE functional use, postural dysfunction, obesity, and pain.   ACTIVITY LIMITATIONS: carrying, lifting, bending, standing, squatting, stairs, transfers, continence, and reach over head  PARTICIPATION LIMITATIONS: meal prep, cleaning, laundry, driving, shopping, community activity, and yard work  PERSONAL FACTORS: Age and 3+ comorbidities: HTN, Arthritis, Low back pain, Trigeminal Neuralgia  are also affecting patient's functional outcome.   REHAB POTENTIAL: Good  CLINICAL DECISION MAKING: Evolving/moderate complexity  EVALUATION COMPLEXITY: Moderate  PLAN:  PT FREQUENCY: 1-2x/week  PT DURATION: 12 weeks  PLANNED INTERVENTIONS: Therapeutic exercises, Therapeutic activity, Neuromuscular re-education, Balance training, Gait training, Patient/Family education, Self Care, Joint mobilization, Stair training, Vestibular training, Canalith repositioning, DME instructions, Dry Needling, Electrical stimulation, Spinal manipulation, Spinal mobilization, Cryotherapy, Moist heat, Manual therapy, and  Re-evaluation  PLAN FOR NEXT SESSION: Progress therex, bed mobility, and balance training. Initiate training at [facility] for UE/LE strengthening and cardiovascular training.    Lenda Kelp, PT 10/12/2022, 8:08 AM  8:08 AM, 10/12/22  Physical Therapist - [facility]  Outpatient Physical Therapy- Main Campus [phone]

## 2022-10-14 ENCOUNTER — Ambulatory Visit: Payer: Medicare HMO | Admitting: Physical Therapy

## 2022-10-14 DIAGNOSIS — R2689 Other abnormalities of gait and mobility: Secondary | ICD-10-CM

## 2022-10-14 DIAGNOSIS — M6281 Muscle weakness (generalized): Secondary | ICD-10-CM

## 2022-10-14 DIAGNOSIS — R269 Unspecified abnormalities of gait and mobility: Secondary | ICD-10-CM

## 2022-10-14 DIAGNOSIS — R262 Difficulty in walking, not elsewhere classified: Secondary | ICD-10-CM | POA: Diagnosis not present

## 2022-10-14 DIAGNOSIS — R2681 Unsteadiness on feet: Secondary | ICD-10-CM

## 2022-10-14 NOTE — Therapy (Signed)
OUTPATIENT PHYSICAL THERAPY NEURO TREATMENT    Patient Name: [name] MRN: [id number] DOB:04/06/1945, 77 y.o., male Today's Date: 10/14/2022   PCP: Ardyth Man, PA- C REFERRING PROVIDER: Morene Crocker, MD  END OF SESSION:  PT End of Session - 10/14/22 1628     Visit Number 17    Number of Visits 24    Date for PT Re-Evaluation 11/01/22    Authorization Type [employer]    Progress Note Due on Visit 20    PT Start Time 1622    PT Stop Time 1700    PT Time Calculation (min) 38 min    Equipment Utilized During Treatment Gait belt    Activity Tolerance Patient tolerated treatment well;No increased pain;Patient limited by pain    Behavior During Therapy [facility] for tasks assessed/performed                    Past Medical History:  Diagnosis Date   Anxiety    Arteriosclerosis of coronary artery 11/16/2011   Overview:  Stent 10/2011 stent rca 2015 with collaterals to lad which is chronically occluded    Benign enlargement of prostate    Benign essential HTN 06/11/2014   Benign prostatic hypertrophy without urinary obstruction 07/31/2014   Bilateral cataracts 05/16/2013   Overview:  [facility]     Bone spur of foot    Left   BP (high blood pressure) 11/09/2012   Cancer (HCC)    skin (forehead) and bladder   Carotid artery narrowing 02/08/2014   Depression    Detrusor hypertrophy    Diabetes (HCC)    Diabetes mellitus, type 2 (HCC) 12/12/2012   Diverticulosis    Dyspnea    Esophageal reflux    Esophageal reflux    Fothergill's neuralgia 08/07/2012   Overview:  [facility]    Gastritis    GERD (gastroesophageal reflux disease)    Headache    cluster headaches   Healed myocardial infarct 11/09/2012   Hearing loss in left ear    Heart disease    Hematuria    Hemorrhoids    History of hiatal hernia 12/14/2017   small    Hypercholesteremia    Lesion of bladder    Myocardial infarct (HCC)    Presence of stent in coronary artery  11/09/2012   Rectal bleeding    Trigeminal neuralgia    Trigeminal neuralgia    Valvular heart disease    Vitamin D deficiency    Past Surgical History:  Procedure Laterality Date   APPENDECTOMY     BOTOX INJECTION N/A 12/21/2017   Procedure: Bladder BOTOX INJECTION;  Surgeon: Vanna Scotland, MD;  Location: [facility] ORS;  Service: Urology;  Laterality: N/A;   BOTOX INJECTION N/A 09/11/2018   Procedure: Bladder BOTOX INJECTION;  Surgeon: Vanna Scotland, MD;  Location: [facility] ORS;  Service: Urology;  Laterality: N/A;   CARDIAC CATHETERIZATION     CARDIAC CATHETERIZATION N/A 01/22/2015   Procedure: Left Heart Cath;  Surgeon: Lamar Blinks, MD;  Location: [facility] INVASIVE CV LAB;  Service: Cardiovascular;  Laterality: N/A;   CARDIAC CATHETERIZATION N/A 01/22/2015   Procedure: Coronary Stent Intervention;  Surgeon: Marcina Millard, MD;  Location: [facility] INVASIVE CV LAB;  Service: Cardiovascular;  Laterality: N/A;   CATARACT EXTRACTION, BILATERAL     COLONOSCOPY WITH PROPOFOL N/A 12/08/2015   Procedure: COLONOSCOPY WITH PROPOFOL;  Surgeon: Christena Deem, MD;  Location: [facility] ENDOSCOPY;  Service: Endoscopy;  Laterality: N/A;   COLONOSCOPY  WITH PROPOFOL N/A 12/09/2015   Procedure: COLONOSCOPY WITH PROPOFOL;  Surgeon: Christena Deem, MD;  Location: [facility] ENDOSCOPY;  Service: Endoscopy;  Laterality: N/A;   CORONARY ANGIOPLASTY     5 stents   CORONARY STENT PLACEMENT  2015   x5   CYSTOSCOPY N/A 09/11/2018   Procedure: CYSTOSCOPY;  Surgeon: Vanna Scotland, MD;  Location: [facility] ORS;  Service: Urology;  Laterality: N/A;   CYSTOSCOPY WITH BIOPSY N/A 12/21/2017   Procedure: CYSTOSCOPY WITH BIOPSY;  Surgeon: Vanna Scotland, MD;  Location: [facility] ORS;  Service: Urology;  Laterality: N/A;   ESOPHAGOGASTRODUODENOSCOPY (EGD) WITH PROPOFOL N/A 12/08/2015   Procedure: ESOPHAGOGASTRODUODENOSCOPY (EGD) WITH PROPOFOL;  Surgeon: Christena Deem, MD;  Location: [facility] ENDOSCOPY;  Service: Endoscopy;  Laterality:  N/A;   ESOPHAGOGASTRODUODENOSCOPY (EGD) WITH PROPOFOL N/A 12/13/2017   Procedure: ESOPHAGOGASTRODUODENOSCOPY (EGD) WITH PROPOFOL;  Surgeon: Christena Deem, MD;  Location: [facility] ENDOSCOPY;  Service: Endoscopy;  Laterality: N/A;   EYE SURGERY     HERNIA REPAIR     kidney tumor remove     TRANSURETHRAL RESECTION OF BLADDER TUMOR WITH GYRUS (TURBT-GYRUS)  12/2013   UMBILICAL HERNIA REPAIR     urethral meatotomy     Patient Active Problem List   Diagnosis Date Noted   Class 2 obesity due to excess calories with body mass index (BMI) of 36.0 to 36.9 in adult 04/11/2020   Swelling of limb 03/28/2020   Lymphedema 03/28/2020   Trigeminal neuralgia 12/25/2019   Lower limb ulcer, calf, left, limited to breakdown of skin (HCC) 12/25/2019   OSA (obstructive sleep apnea) 01/23/2019   Acute systolic CHF (congestive heart failure) (HCC) 12/12/2018   Bruising 07/10/2018   Diet-controlled type 2 diabetes mellitus (HCC) 03/24/2018   Microalbuminuria 03/24/2018   Dizziness 11/17/2017   Simple chronic bronchitis (HCC) 09/22/2017   SOBOE (shortness of breath on exertion) 02/18/2017   Chronic midline low back pain without sciatica 12/29/2016   Periodic limb movement disorder 12/29/2016   Benign essential tremor 06/22/2016   Pure hypercholesterolemia 06/20/2015   Erectile dysfunction due to arterial insufficiency 06/16/2015   Unstable angina (HCC) 01/22/2015   Abdominal aortic 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 rupture (HCC) 12/31/2014   Benign prostatic hypertrophy without urinary obstruction 07/31/2014   Enlarged prostate 07/31/2014   Benign essential HTN 06/11/2014   Carotid artery narrowing 02/08/2014   Carotid artery obstruction 02/08/2014   Bilateral carotid artery stenosis 02/08/2014   Chest pain 08/20/2013   Bilateral cataracts 05/16/2013   Cataract 05/16/2013   Clinical depression 12/12/2012   Diabetes mellitus, type 2 (HCC) 12/12/2012   Combined fat and carbohydrate induced hyperlipemia  12/12/2012   Major depressive disorder, single episode, unspecified 12/12/2012   Acid reflux 11/09/2012   Presence of stent in coronary artery 11/09/2012   BP (high blood pressure) 11/09/2012   Healed myocardial infarct 11/09/2012   Gastro-esophageal reflux disease without esophagitis 11/09/2012   History of cardiovascular surgery 11/09/2012   Fothergill's neuralgia 08/07/2012   Swelling of testicle 04/06/2012   Disorder of male genital organ 04/06/2012   Swelling of the testicles 04/06/2012   Arteriosclerosis of coronary artery 11/16/2011   CAD in native artery 11/16/2011   Fatigue 06/04/2011   Avitaminosis D 11/30/2010    ONSET DATE: 03/17/2022- Worsening symptoms  REFERRING DIAG:  G20.A1 (ICD-10-CM) - Parkinsons  R26.2 (ICD-10-CM) - Difficulty walking    THERAPY DIAG:  Difficulty in walking, not elsewhere classified  Muscle weakness (generalized)  Abnormality of gait and mobility  Unsteadiness on feet  Other abnormalities  of gait and mobility  Rationale for Evaluation and Treatment: Rehabilitation  SUBJECTIVE:                                                                                                                                                                                             SUBJECTIVE STATEMENT:  Pt reports doing well today. Pt denies any recent falls/stumbles since prior session. Pt denies any updates to medications or medical appointment since prior session. Pt reports good compliance with HEP when time permits. Pt is having increased fatigue and pain this date compared to previous session.    Patient reports 0/10  Pt accompanied by: significant other  PERTINENT HISTORY: Patient is a 77 year old male with referral to outpatient PT for Parkinsons and difficulty walking. He was recently seen by Neurology worsening symptoms of parkinson- difficulty with walking. He has past medical history of Arthritis, Bladder cancer, and Trigeminal  Neuralgia.  PAIN:  Are you having pain? NO  PRECAUTIONS: Fall  WEIGHT BEARING RESTRICTIONS: No  FALLS: Has patient fallen in last 6 months? Yes. Number of falls 1  LIVING ENVIRONMENT: Lives with: lives with their spouse Lives in: House/apartment Stairs: Yes: External: 2 steps; has grab bar Has following equipment at home: Dan Humphreys - 2 wheeled, Environmental consultant - 4 wheeled, and Grab bars  PLOF: Independent with household mobility with device, Independent with community mobility with device, Independent with transfers, and Requires assistive device for independence  PATIENT GOALS: Get to where I can walk better again and get my legs stronger.   OBJECTIVE:    TODAY'S TREATMENT:                                                                                                                              DATE: 10/14/22   Therex:    Unless otherwise stated, CGA was provided and gait belt donned in order to ensure pt safety  -pt reports increased fatigue, pain in the R shoulder, and back pain and activities modified as a result   Nustep -UE and LE level 1 x 2  min level 3 x 4 min   Seated scap retraction BTB x 20 Resistive gait with 4WW- 3# AW x 175 feet  Standing step over 1/2 foam roll with L UE support x 10 ea LE  Sit to stand x 10 without UE support.  Resistive gait with 4WW 3# AW x 175 ft  Seated LAQ 2 x 10 reps with 3# AW  Resistive gait with 4WW- 3# AW x 175 feet  Seated Heel raise on 1/2 foam and with 3# AW 2 x 15   Pt required occasional rest breaks due fatigue, PT was quick to ask when pt appeared to be fatiguing in order to prevent excessive fatigue.        PATIENT EDUCATION: Education details: Plan of care; purpose of functional outcome measures.  Person educated: Patient Education method: Explanation, Demonstration, Tactile cues, Verbal cues, and Handouts Education comprehension: verbalized understanding, returned demonstration, verbal cues required, tactile cues  required, and needs further education  HOME EXERCISE PROGRAM: Access Code: [email or web address] URL: [email or web address] Date: 08/11/2022 Prepared by: Maureen Ralphs  Exercises - Seated Lumbar Flexion Stretch  - 7 x weekly - 3 sets - 30 sec hold - Seated Thoracic Flexion and Rotation with Arms Crossed  - 1 x daily - 7 x weekly - 3 sets - 30 sec hold - Seated Figure 4 Piriformis Stretch  - 1 x daily - 7 x weekly - 3 sets - 10 reps  GOALS: Goals reviewed with patient? Yes  SHORT TERM GOALS: Target date: 09/20/2022  Pt will be independent with a comprehensive HEP in order to improve strength and balance in order to decrease fall risk and improve function at home and work.  Baseline: EVAL: Patient admits not performing much exercises - not moving around well.  Goal status: INITIAL   LONG TERM GOALS: Target date: 11/01/2022  Pt will improve FOTO to target score of 45 % to display perceived improvements in ability to complete ADL's.  Baseline: EVAL: 42 Goal status: INITIAL  2.  Pt will decrease 5TSTS by at least 5 seconds in order to demonstrate clinically significant improvement in LE strength.  Baseline: EVAL = 17.39 sec without UE support Goal status: INITIAL  3. Pt will increase by at least 0.15 m/s in order to demonstrate clinically significant improvement in community ambulation   Baseline: EVAL= 0.67 m/s Goal status: INITIAL  4.  Pt will improve BERG by at least 3 points in order to demonstrate clinically significant improvement in balance.   Baseline: EVAL: to be assessed next visit: 08/23/2022= 36/56 Goal status: INITIAL  5.  Pt will decrease TUG to below 14 seconds/decrease in order to demonstrate decreased fall risk. Baseline: EVAL: 22.00 sec with upright 4WW Goal status: INITIAL  6.  Pt will increase by at least 67m (145ft) in order to demonstrate clinically significant improvement in cardiopulmonary endurance and community ambulation  Baseline:  EVAL: 3:45 min (450 feet with upright 4WW) Goal status: INITIAL 7.   Patient will report returning to walking in to community places using most appropriate assistive device vs current use of scooter for improved abilities with mobility during community outings Baseline: EVAL: Current using scooter for many social/community outings  Goal status: INITIAL ASSESSMENT:  CLINICAL IMPRESSION:  Continued with current plan of care as laid out in evaluation and recent prior sessions. All interventions tolerated as expected by author.  Author takes steps to maximize patient independence when appropriate. Pt remains highly motivated. Pt closely monitored throughout session for  safe activity response, as well as to maximize patient safety during interventions. The patient's therapy prognosis indicates continued potential for improvement, anticipate that future progress is attainable in a reasonable/predictable timeframe. Maximum improvement is within reach. Pt will continue to benefit from skilled PT services to address deficits and impairment identified in evaluation in order to maximize independence and safety in basic mobility required for performance of ADL, IADL, and leisure.    OBJECTIVE IMPAIRMENTS: Abnormal gait, cardiopulmonary status limiting activity, decreased activity tolerance, decreased balance, decreased coordination, decreased endurance, decreased mobility, difficulty walking, decreased ROM, decreased strength, hypomobility, impaired flexibility, impaired sensation, impaired UE functional use, postural dysfunction, obesity, and pain.   ACTIVITY LIMITATIONS: carrying, lifting, bending, standing, squatting, stairs, transfers, continence, and reach over head  PARTICIPATION LIMITATIONS: meal prep, cleaning, laundry, driving, shopping, community activity, and yard work  PERSONAL FACTORS: Age and 3+ comorbidities: HTN, Arthritis, Low back pain, Trigeminal Neuralgia  are also affecting patient's  functional outcome.   REHAB POTENTIAL: Good  CLINICAL DECISION MAKING: Evolving/moderate complexity  EVALUATION COMPLEXITY: Moderate  PLAN:  PT FREQUENCY: 1-2x/week  PT DURATION: 12 weeks  PLANNED INTERVENTIONS: Therapeutic exercises, Therapeutic activity, Neuromuscular re-education, Balance training, Gait training, Patient/Family education, Self Care, Joint mobilization, Stair training, Vestibular training, Canalith repositioning, DME instructions, Dry Needling, Electrical stimulation, Spinal manipulation, Spinal mobilization, Cryotherapy, Moist heat, Manual therapy, and Re-evaluation  PLAN FOR NEXT SESSION: Progress therex, bed mobility, and balance training. Initiate training at [facility] for UE/LE strengthening and cardiovascular training.    Norman Herrlich, PT 10/14/2022, 4:28 PM  4:28 PM, 10/14/22  Physical Therapist - [facility]  Outpatient Physical Therapy- Main Campus [phone]

## 2022-10-19 ENCOUNTER — Other Ambulatory Visit: Payer: Self-pay | Admitting: Urology

## 2022-10-19 ENCOUNTER — Ambulatory Visit: Payer: Medicare HMO | Admitting: Urology

## 2022-10-19 ENCOUNTER — Encounter: Payer: Self-pay | Admitting: Urology

## 2022-10-19 ENCOUNTER — Ambulatory Visit: Payer: Medicare HMO

## 2022-10-19 VITALS — BP 112/69 | HR 73

## 2022-10-19 DIAGNOSIS — R278 Other lack of coordination: Secondary | ICD-10-CM

## 2022-10-19 DIAGNOSIS — N3946 Mixed incontinence: Secondary | ICD-10-CM

## 2022-10-19 DIAGNOSIS — Z8551 Personal history of malignant neoplasm of bladder: Secondary | ICD-10-CM

## 2022-10-19 DIAGNOSIS — R269 Unspecified abnormalities of gait and mobility: Secondary | ICD-10-CM

## 2022-10-19 DIAGNOSIS — R2681 Unsteadiness on feet: Secondary | ICD-10-CM

## 2022-10-19 DIAGNOSIS — G8929 Other chronic pain: Secondary | ICD-10-CM

## 2022-10-19 DIAGNOSIS — Z08 Encounter for follow-up examination after completed treatment for malignant neoplasm: Secondary | ICD-10-CM | POA: Diagnosis not present

## 2022-10-19 DIAGNOSIS — R262 Difficulty in walking, not elsewhere classified: Secondary | ICD-10-CM | POA: Diagnosis not present

## 2022-10-19 DIAGNOSIS — R2689 Other abnormalities of gait and mobility: Secondary | ICD-10-CM

## 2022-10-19 DIAGNOSIS — N3281 Overactive bladder: Secondary | ICD-10-CM

## 2022-10-19 DIAGNOSIS — M6281 Muscle weakness (generalized): Secondary | ICD-10-CM

## 2022-10-19 LAB — URINALYSIS, COMPLETE
Bilirubin, UA: NEGATIVE
Glucose, UA: NEGATIVE
Leukocytes,UA: NEGATIVE
Nitrite, UA: NEGATIVE
RBC, UA: NEGATIVE
Specific Gravity, UA: >1.03 — ABNORMAL HIGH (ref 1.005–1.030)
Urobilinogen, Ur: 1 mg/dL (ref 0.2–1.0)
pH, UA: 5.5 (ref 5.0–7.5)

## 2022-10-19 LAB — MICROSCOPIC EXAMINATION

## 2022-10-19 NOTE — Progress Notes (Signed)
   09/30/21  CC:  Chief Complaint  Patient presents with   Cysto     HPI: [name] is a 77 y.o. male with a hx of bladder cancer returns today for a surveillance cysto.    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   He has failed Botox, multiple oral agents as well as stage I InterStim.  Mild improvement with PTNS.  Currently back on oxybutynin with minimal mprovement, managed by Dr. Sherron Monday.  Continues to have urge incontinence.    Recently treated for UTI on 09/22/22, symptoms resolved within days.  UA today negative.  Now dx with Parkinsons.   Vitals:   10/19/22 0915 10/19/22 0929  BP: (!) 78/48 112/69  Pulse: (!) 54 73   NED. A&Ox3.   No respiratory distress   Abd soft, NT, ND Normal phallus with bilateral descended testicles  Cystoscopy Procedure Note  Patient identification was confirmed, informed consent was obtained, and patient was prepped using Betadine solution.  Lidocaine jelly was administered per urethral meatus.     Pre-Procedure: - Inspection reveals a normal caliber ureteral meatus.  Procedure: The flexible cystoscope was introduced without difficulty - No urethral strictures/lesions are present. - Normal prostate  - Normal bladder neck - Bilateral ureteral orifices identified - Bladder mucosa  reveals no ulcers, tumors, or lesions - No bladder stones - Moderate/ severe trabeculation with saccules  Retroflexion shows NED   Post-Procedure: - Patient tolerated the procedure well   Assessment/ Plan:  History of bladder cancer  - NED - Will continue annual cystoscopy    2. OAB (overactive bladder) - Managed by Dr Sherron Monday  as needed -Tried and failed intervention  F/u 1 year cysto    Vanna Scotland, MD

## 2022-10-19 NOTE — Therapy (Signed)
OUTPATIENT PHYSICAL THERAPY NEURO TREATMENT    Patient Name: [name] MRN: [id number] DOB:1945/11/15, 77 y.o., male Today's Date: 10/19/2022   PCP: Ardyth Man, PA- C REFERRING PROVIDER: Morene Crocker, MD  END OF SESSION:  PT End of Session - 10/19/22 1150     Visit Number 18    Number of Visits 24    Date for PT Re-Evaluation 11/01/22    Authorization Type [employer]    Progress Note Due on Visit 20    PT Start Time 1145    Equipment Utilized During Treatment Gait belt    Activity Tolerance Patient tolerated treatment well;No increased pain;Patient limited by pain    Behavior During Therapy [facility] for tasks assessed/performed                    Past Medical History:  Diagnosis Date   Anxiety    Arteriosclerosis of coronary artery 11/16/2011   Overview:  Stent 10/2011 stent rca 2015 with collaterals to lad which is chronically occluded    Benign enlargement of prostate    Benign essential HTN 06/11/2014   Benign prostatic hypertrophy without urinary obstruction 07/31/2014   Bilateral cataracts 05/16/2013   Overview:  [facility]     Bone spur of foot    Left   BP (high blood pressure) 11/09/2012   Cancer (HCC)    skin (forehead) and bladder   Carotid artery narrowing 02/08/2014   Depression    Detrusor hypertrophy    Diabetes (HCC)    Diabetes mellitus, type 2 (HCC) 12/12/2012   Diverticulosis    Dyspnea    Esophageal reflux    Esophageal reflux    Fothergill's neuralgia 08/07/2012   Overview:  [facility]    Gastritis    GERD (gastroesophageal reflux disease)    Headache    cluster headaches   Healed myocardial infarct 11/09/2012   Hearing loss in left ear    Heart disease    Hematuria    Hemorrhoids    History of hiatal hernia 12/14/2017   small    Hypercholesteremia    Lesion of bladder    Myocardial infarct (HCC)    Presence of stent in coronary artery 11/09/2012   Rectal bleeding    Trigeminal neuralgia     Trigeminal neuralgia    Valvular heart disease    Vitamin D deficiency    Past Surgical History:  Procedure Laterality Date   APPENDECTOMY     BOTOX INJECTION N/A 12/21/2017   Procedure: Bladder BOTOX INJECTION;  Surgeon: Vanna Scotland, MD;  Location: [facility] ORS;  Service: Urology;  Laterality: N/A;   BOTOX INJECTION N/A 09/11/2018   Procedure: Bladder BOTOX INJECTION;  Surgeon: Vanna Scotland, MD;  Location: [facility] ORS;  Service: Urology;  Laterality: N/A;   CARDIAC CATHETERIZATION     CARDIAC CATHETERIZATION N/A 01/22/2015   Procedure: Left Heart Cath;  Surgeon: Lamar Blinks, MD;  Location: [facility] INVASIVE CV LAB;  Service: Cardiovascular;  Laterality: N/A;   CARDIAC CATHETERIZATION N/A 01/22/2015   Procedure: Coronary Stent Intervention;  Surgeon: Marcina Millard, MD;  Location: [facility] INVASIVE CV LAB;  Service: Cardiovascular;  Laterality: N/A;   CATARACT EXTRACTION, BILATERAL     COLONOSCOPY WITH PROPOFOL N/A 12/08/2015   Procedure: COLONOSCOPY WITH PROPOFOL;  Surgeon: Christena Deem, MD;  Location: [facility] ENDOSCOPY;  Service: Endoscopy;  Laterality: N/A;   COLONOSCOPY WITH PROPOFOL N/A 12/09/2015   Procedure: COLONOSCOPY WITH PROPOFOL;  Surgeon: Christena Deem, MD;  Location: [facility] ENDOSCOPY;  Service: Endoscopy;  Laterality: N/A;   CORONARY ANGIOPLASTY     5 stents   CORONARY STENT PLACEMENT  2015   x5   CYSTOSCOPY N/A 09/11/2018   Procedure: CYSTOSCOPY;  Surgeon: Vanna Scotland, MD;  Location: [facility] ORS;  Service: Urology;  Laterality: N/A;   CYSTOSCOPY WITH BIOPSY N/A 12/21/2017   Procedure: CYSTOSCOPY WITH BIOPSY;  Surgeon: Vanna Scotland, MD;  Location: [facility] ORS;  Service: Urology;  Laterality: N/A;   ESOPHAGOGASTRODUODENOSCOPY (EGD) WITH PROPOFOL N/A 12/08/2015   Procedure: ESOPHAGOGASTRODUODENOSCOPY (EGD) WITH PROPOFOL;  Surgeon: Christena Deem, MD;  Location: [facility]COPY;  Service: Endoscopy;  Laterality: N/A;   ESOPHAGOGASTRODUODENOSCOPY (EGD) WITH PROPOFOL N/A  12/13/2017   Procedure: ESOPHAGOGASTRODUODENOSCOPY (EGD) WITH PROPOFOL;  Surgeon: Christena Deem, MD;  Location: [facility] ENDOSCOPY;  Service: Endoscopy;  Laterality: N/A;   EYE SURGERY     HERNIA REPAIR     kidney tumor remove     TRANSURETHRAL RESECTION OF BLADDER TUMOR WITH GYRUS (TURBT-GYRUS)  12/2013   UMBILICAL HERNIA REPAIR     urethral meatotomy     Patient Active Problem List   Diagnosis Date Noted   Class 2 obesity due to excess calories with body mass index (BMI) of 36.0 to 36.9 in adult 04/11/2020   Swelling of limb 03/28/2020   Lymphedema 03/28/2020   Trigeminal neuralgia 12/25/2019   Lower limb ulcer, calf, left, limited to breakdown of skin (HCC) 12/25/2019   OSA (obstructive sleep apnea) 01/23/2019   Acute systolic CHF (congestive heart failure) (HCC) 12/12/2018   Bruising 07/10/2018   Diet-controlled type 2 diabetes mellitus (HCC) 03/24/2018   Microalbuminuria 03/24/2018   Dizziness 11/17/2017   Simple chronic bronchitis (HCC) 09/22/2017   SOBOE (shortness of breath on exertion) 02/18/2017   Chronic midline low back pain without sciatica 12/29/2016   Periodic limb movement disorder 12/29/2016   Benign essential tremor 06/22/2016   Pure hypercholesterolemia 06/20/2015   Erectile dysfunction due to arterial insufficiency 06/16/2015   Unstable angina (HCC) 01/22/2015   Abdominal aortic 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 rupture (HCC) 12/31/2014   Benign prostatic hypertrophy without urinary obstruction 07/31/2014   Enlarged prostate 07/31/2014   Benign essential HTN 06/11/2014   Carotid artery narrowing 02/08/2014   Carotid artery obstruction 02/08/2014   Bilateral carotid artery stenosis 02/08/2014   Chest pain 08/20/2013   Bilateral cataracts 05/16/2013   Cataract 05/16/2013   Clinical depression 12/12/2012   Diabetes mellitus, type 2 (HCC) 12/12/2012   Combined fat and carbohydrate induced hyperlipemia 12/12/2012   Major depressive disorder, single episode, unspecified  12/12/2012   Acid reflux 11/09/2012   Presence of stent in coronary artery 11/09/2012   BP (high blood pressure) 11/09/2012   Healed myocardial infarct 11/09/2012   Gastro-esophageal reflux disease without esophagitis 11/09/2012   History of cardiovascular surgery 11/09/2012   Fothergill's neuralgia 08/07/2012   Swelling of testicle 04/06/2012   Disorder of male genital organ 04/06/2012   Swelling of the testicles 04/06/2012   Arteriosclerosis of coronary artery 11/16/2011   CAD in native artery 11/16/2011   Fatigue 06/04/2011   Avitaminosis D 11/30/2010    ONSET DATE: 03/17/2022- Worsening symptoms  REFERRING DIAG:  G20.A1 (ICD-10-CM) - Parkinsons  R26.2 (ICD-10-CM) - Difficulty walking    THERAPY DIAG:  Difficulty in walking, not elsewhere classified  Muscle weakness (generalized)  Abnormality of gait and mobility  Unsteadiness on feet  Other abnormalities of gait and mobility  Other lack of coordination  Chronic bilateral low back pain without sciatica  Rationale for Evaluation and Treatment: Rehabilitation  SUBJECTIVE:                                                                                                                                                                                             SUBJECTIVE STATEMENT:  Pt reports doing okay. States now on O2 prn.     Patient reports 0/10  Pt accompanied by: significant other  PERTINENT HISTORY: Patient is a 77 year old male with referral to outpatient PT for Parkinsons and difficulty walking. He was recently seen by Neurology worsening symptoms of parkinson- difficulty with walking. He has past medical history of Arthritis, Bladder cancer, and Trigeminal Neuralgia.  PAIN:  Are you having pain? NO  PRECAUTIONS: Fall  WEIGHT BEARING RESTRICTIONS: No  FALLS: Has patient fallen in last 6 months? Yes. Number of falls 1  LIVING ENVIRONMENT: Lives with: lives with their spouse Lives in:  House/apartment Stairs: Yes: External: 2 steps; has grab bar Has following equipment at home: Dan Humphreys - 2 wheeled, Environmental consultant - 4 wheeled, and Grab bars  PLOF: Independent with household mobility with device, Independent with community mobility with device, Independent with transfers, and Requires assistive device for independence  PATIENT GOALS: Get to where I can walk better again and get my legs stronger.   OBJECTIVE:    TODAY'S TREATMENT:                                                                                                                              DATE: 10/19/22   Therex:   Tested O2 sat= 94% upon walking into clinic- up to 98% with 2 min of resting  2 min step test= 40 steps on right and retested O2 sat= 95%   2 min side step over 1/2 foam roll - O2 sat = 94%  Sit to stand x 15 without UE support including BUE shoulder raises. O2 sat=95%  Resistive gait 330 feet using 4WW, CGA- cues for increased step length and erect standing   Standing scap retraction BTB 2 sets  x 15  Resistive gait with 4WW- 3# AW x 175 feet   Seated LAQ 3# AW- 2 sets of 10 reps  Seated hip march- 3# AW- 2 sets of 10 reps   Pt required occasional rest breaks due fatigue, PT was quick to ask when pt appeared to be fatiguing in order to prevent excessive fatigue.        PATIENT EDUCATION: Education details: Plan of care; purpose of functional outcome measures.  Person educated: Patient Education method: Explanation, Demonstration, Tactile cues, Verbal cues, and Handouts Education comprehension: verbalized understanding, returned demonstration, verbal cues required, tactile cues required, and needs further education  HOME EXERCISE PROGRAM: Access Code: [email or web address] URL: [email or web address] Date: 08/11/2022 Prepared by: Maureen Ralphs  Exercises - Seated Lumbar Flexion Stretch  - 7 x weekly - 3 sets - 30 sec hold - Seated Thoracic Flexion and Rotation with Arms  Crossed  - 1 x daily - 7 x weekly - 3 sets - 30 sec hold - Seated Figure 4 Piriformis Stretch  - 1 x daily - 7 x weekly - 3 sets - 10 reps  GOALS: Goals reviewed with patient? Yes  SHORT TERM GOALS: Target date: 09/20/2022  Pt will be independent with a comprehensive HEP in order to improve strength and balance in order to decrease fall risk and improve function at home and work.  Baseline: EVAL: Patient admits not performing much exercises - not moving around well.  Goal status: INITIAL   LONG TERM GOALS: Target date: 11/01/2022  Pt will improve FOTO to target score of 45 % to display perceived improvements in ability to complete ADL's.  Baseline: EVAL: 42 Goal status: INITIAL  2.  Pt will decrease 5TSTS by at least 5 seconds in order to demonstrate clinically significant improvement in LE strength.  Baseline: EVAL = 17.39 sec without UE support Goal status: INITIAL  3. Pt will increase by at least 0.15 m/s in order to demonstrate clinically significant improvement in community ambulation   Baseline: EVAL= 0.67 m/s Goal status: INITIAL  4.  Pt will improve BERG by at least 3 points in order to demonstrate clinically significant improvement in balance.   Baseline: EVAL: to be assessed next visit: 08/23/2022= 36/56 Goal status: INITIAL  5.  Pt will decrease TUG to below 14 seconds/decrease in order to demonstrate decreased fall risk. Baseline: EVAL: 22.00 sec with upright 4WW Goal status: INITIAL  6.  Pt will increase by at least 56m (194ft) in order to demonstrate clinically significant improvement in cardiopulmonary endurance and community ambulation  Baseline: EVAL: 3:45 min (450 feet with upright 4WW) Goal status: INITIAL 7.   Patient will report returning to walking in to community places using most appropriate assistive device vs current use of scooter for improved abilities with mobility during community outings Baseline: EVAL: Current using scooter for many  social/community outings  Goal status: INITIAL ASSESSMENT:  CLINICAL IMPRESSION:  Patient presents with good motivation. Treatment focused more today on functional endurance activities due to recent report of being prescribed oxygen. All O2 sats remained above 94% today despite some noted SOB. Pt will continue to benefit from skilled PT services to address deficits and impairment identified in evaluation in order to maximize independence and safety in basic mobility required for performance of ADL, IADL, and leisure.    OBJECTIVE IMPAIRMENTS: Abnormal gait, cardiopulmonary status limiting activity, decreased activity tolerance, decreased balance, decreased coordination, decreased endurance, decreased mobility, difficulty walking, decreased ROM, decreased strength, hypomobility, impaired flexibility, impaired sensation, impaired UE  functional use, postural dysfunction, obesity, and pain.   ACTIVITY LIMITATIONS: carrying, lifting, bending, standing, squatting, stairs, transfers, continence, and reach over head  PARTICIPATION LIMITATIONS: meal prep, cleaning, laundry, driving, shopping, community activity, and yard work  PERSONAL FACTORS: Age and 3+ comorbidities: HTN, Arthritis, Low back pain, Trigeminal Neuralgia  are also affecting patient's functional outcome.   REHAB POTENTIAL: Good  CLINICAL DECISION MAKING: Evolving/moderate complexity  EVALUATION COMPLEXITY: Moderate  PLAN:  PT FREQUENCY: 1-2x/week  PT DURATION: 12 weeks  PLANNED INTERVENTIONS: Therapeutic exercises, Therapeutic activity, Neuromuscular re-education, Balance training, Gait training, Patient/Family education, Self Care, Joint mobilization, Stair training, Vestibular training, Canalith repositioning, DME instructions, Dry Needling, Electrical stimulation, Spinal manipulation, Spinal mobilization, Cryotherapy, Moist heat, Manual therapy, and Re-evaluation  PLAN FOR NEXT SESSION: Progress therex, bed mobility, and  balance training. Initiate training at [facility] for UE/LE strengthening and cardiovascular training.    Lenda Kelp, PT 10/19/2022, 11:51 AM  11:51 AM, 10/19/22  Physical Therapist - [facility]  Outpatient Physical Therapy- Main Campus [phone]

## 2022-10-22 ENCOUNTER — Ambulatory Visit: Payer: Medicare HMO

## 2022-10-22 DIAGNOSIS — R2681 Unsteadiness on feet: Secondary | ICD-10-CM

## 2022-10-22 DIAGNOSIS — R2689 Other abnormalities of gait and mobility: Secondary | ICD-10-CM

## 2022-10-22 DIAGNOSIS — M6281 Muscle weakness (generalized): Secondary | ICD-10-CM

## 2022-10-22 DIAGNOSIS — R269 Unspecified abnormalities of gait and mobility: Secondary | ICD-10-CM

## 2022-10-22 DIAGNOSIS — R262 Difficulty in walking, not elsewhere classified: Secondary | ICD-10-CM

## 2022-10-22 NOTE — Therapy (Signed)
OUTPATIENT PHYSICAL THERAPY NEURO TREATMENT    Patient Name: [name] MRN: [id number] DOB:[birth year], 77 y.o., male Today's Date: 10/22/2022   PCP: Ardyth Man, PA- C REFERRING PROVIDER: Morene Crocker, MD  END OF SESSION:  PT End of Session - 10/22/22 1000     Visit Number 19    Number of Visits 24    Date for PT Re-Evaluation 11/01/22    Authorization Type [employer]    Progress Note Due on Visit 20    PT Start Time 0950    PT Stop Time 1041    PT Time Calculation (min) 51 min    Equipment Utilized During Treatment Gait belt    Activity Tolerance Patient tolerated treatment well;No increased pain;Patient limited by pain    Behavior During Therapy [facility] for tasks assessed/performed                     Past Medical History:  Diagnosis Date   Anxiety    Arteriosclerosis of coronary artery 11/16/2011   Overview:  Stent 10/2011 stent rca 2015 with collaterals to lad which is chronically occluded    Benign enlargement of prostate    Benign essential HTN 06/11/2014   Benign prostatic hypertrophy without urinary obstruction 07/31/2014   Bilateral cataracts 05/16/2013   Overview:  [facility]     Bone spur of foot    Left   BP (high blood pressure) 11/09/2012   Cancer (HCC)    skin (forehead) and bladder   Carotid artery narrowing 02/08/2014   Depression    Detrusor hypertrophy    Diabetes (HCC)    Diabetes mellitus, type 2 (HCC) 12/12/2012   Diverticulosis    Dyspnea    Esophageal reflux    Esophageal reflux    Fothergill's neuralgia 08/07/2012   Overview:  [facility]    Gastritis    GERD (gastroesophageal reflux disease)    Headache    cluster headaches   Healed myocardial infarct 11/09/2012   Hearing loss in left ear    Heart disease    Hematuria    Hemorrhoids    History of hiatal hernia 12/14/2017   small    Hypercholesteremia    Lesion of bladder    Myocardial infarct (HCC)    Presence of stent in coronary artery  11/09/2012   Rectal bleeding    Trigeminal neuralgia    Trigeminal neuralgia    Valvular heart disease    Vitamin D deficiency    Past Surgical History:  Procedure Laterality Date   APPENDECTOMY     BOTOX INJECTION N/A 12/21/2017   Procedure: Bladder BOTOX INJECTION;  Surgeon: Vanna Scotland, MD;  Location: [facility] ORS;  Service: Urology;  Laterality: N/A;   BOTOX INJECTION N/A 09/11/2018   Procedure: Bladder BOTOX INJECTION;  Surgeon: Vanna Scotland, MD;  Location: [facility] ORS;  Service: Urology;  Laterality: N/A;   CARDIAC CATHETERIZATION     CARDIAC CATHETERIZATION N/A 01/22/2015   Procedure: Left Heart Cath;  Surgeon: Lamar Blinks, MD;  Location: [facility] INVASIVE CV LAB;  Service: Cardiovascular;  Laterality: N/A;   CARDIAC CATHETERIZATION N/A 01/22/2015   Procedure: Coronary Stent Intervention;  Surgeon: Marcina Millard, MD;  Location: [facility] INVASIVE CV LAB;  Service: Cardiovascular;  Laterality: N/A;   CATARACT EXTRACTION, BILATERAL     COLONOSCOPY WITH PROPOFOL N/A 12/08/2015   Procedure: COLONOSCOPY WITH PROPOFOL;  Surgeon: Christena Deem, MD;  Location: [facility] ENDOSCOPY;  Service: Endoscopy;  Laterality: N/A;  COLONOSCOPY WITH PROPOFOL N/A 12/09/2015   Procedure: COLONOSCOPY WITH PROPOFOL;  Surgeon: Christena Deem, MD;  Location: [facility] ENDOSCOPY;  Service: Endoscopy;  Laterality: N/A;   CORONARY ANGIOPLASTY     5 stents   CORONARY STENT PLACEMENT  2015   x5   CYSTOSCOPY N/A 09/11/2018   Procedure: CYSTOSCOPY;  Surgeon: Vanna Scotland, MD;  Location: [facility] ORS;  Service: Urology;  Laterality: N/A;   CYSTOSCOPY WITH BIOPSY N/A 12/21/2017   Procedure: CYSTOSCOPY WITH BIOPSY;  Surgeon: Vanna Scotland, MD;  Location: [facility] ORS;  Service: Urology;  Laterality: N/A;   ESOPHAGOGASTRODUODENOSCOPY (EGD) WITH PROPOFOL N/A 12/08/2015   Procedure: ESOPHAGOGASTRODUODENOSCOPY (EGD) WITH PROPOFOL;  Surgeon: Christena Deem, MD;  Location: [facility] ENDOSCOPY;  Service: Endoscopy;  Laterality:  N/A;   ESOPHAGOGASTRODUODENOSCOPY (EGD) WITH PROPOFOL N/A 12/13/2017   Procedure: ESOPHAGOGASTRODUODENOSCOPY (EGD) WITH PROPOFOL;  Surgeon: Christena Deem, MD;  Location: [facility] ENDOSCOPY;  Service: Endoscopy;  Laterality: N/A;   EYE SURGERY     HERNIA REPAIR     kidney tumor remove     TRANSURETHRAL RESECTION OF BLADDER TUMOR WITH GYRUS (TURBT-GYRUS)  12/2013   UMBILICAL HERNIA REPAIR     urethral meatotomy     Patient Active Problem List   Diagnosis Date Noted   Class 2 obesity due to excess calories with body mass index (BMI) of 36.0 to 36.9 in adult 04/11/2020   Swelling of limb 03/28/2020   Lymphedema 03/28/2020   Trigeminal neuralgia 12/25/2019   Lower limb ulcer, calf, left, limited to breakdown of skin (HCC) 12/25/2019   OSA (obstructive sleep apnea) 01/23/2019   Acute systolic CHF (congestive heart failure) (HCC) 12/12/2018   Bruising 07/10/2018   Diet-controlled type 2 diabetes mellitus (HCC) 03/24/2018   Microalbuminuria 03/24/2018   Dizziness 11/17/2017   Simple chronic bronchitis (HCC) 09/22/2017   SOBOE (shortness of breath on exertion) 02/18/2017   Chronic midline low back pain without sciatica 12/29/2016   Periodic limb movement disorder 12/29/2016   Benign essential tremor 06/22/2016   Pure hypercholesterolemia 06/20/2015   Erectile dysfunction due to arterial insufficiency 06/16/2015   Unstable angina (HCC) 01/22/2015   Abdominal aortic 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 rupture (HCC) 12/31/2014   Benign prostatic hypertrophy without urinary obstruction 07/31/2014   Enlarged prostate 07/31/2014   Benign essential HTN 06/11/2014   Carotid artery narrowing 02/08/2014   Carotid artery obstruction 02/08/2014   Bilateral carotid artery stenosis 02/08/2014   Chest pain 08/20/2013   Bilateral cataracts 05/16/2013   Cataract 05/16/2013   Clinical depression 12/12/2012   Diabetes mellitus, type 2 (HCC) 12/12/2012   Combined fat and carbohydrate induced hyperlipemia  12/12/2012   Major depressive disorder, single episode, unspecified 12/12/2012   Acid reflux 11/09/2012   Presence of stent in coronary artery 11/09/2012   BP (high blood pressure) 11/09/2012   Healed myocardial infarct 11/09/2012   Gastro-esophageal reflux disease without esophagitis 11/09/2012   History of cardiovascular surgery 11/09/2012   Fothergill's neuralgia 08/07/2012   Swelling of testicle 04/06/2012   Disorder of male genital organ 04/06/2012   Swelling of the testicles 04/06/2012   Arteriosclerosis of coronary artery 11/16/2011   CAD in native artery 11/16/2011   Fatigue 06/04/2011   Avitaminosis D 11/30/2010    ONSET DATE: 03/17/2022- Worsening symptoms  REFERRING DIAG:  G20.A1 (ICD-10-CM) - Parkinsons  R26.2 (ICD-10-CM) - Difficulty walking    THERAPY DIAG:  Difficulty in walking, not elsewhere classified  Muscle weakness (generalized)  Abnormality of gait and mobility  Unsteadiness on feet  Other  abnormalities of gait and mobility  Rationale for Evaluation and Treatment: Rehabilitation  SUBJECTIVE:                                                                                                                                                                                             SUBJECTIVE STATEMENT:  Patient reports moving slow today- Legs feel like they are in cement.      Patient reports 0/10  Pt accompanied by: significant other  PERTINENT HISTORY: Patient is a 77 year old male with referral to outpatient PT for Parkinsons and difficulty walking. He was recently seen by Neurology worsening symptoms of parkinson- difficulty with walking. He has past medical history of Arthritis, Bladder cancer, and Trigeminal Neuralgia.  PAIN:  Are you having pain? NO  PRECAUTIONS: Fall  WEIGHT BEARING RESTRICTIONS: No  FALLS: Has patient fallen in last 6 months? Yes. Number of falls 1  LIVING ENVIRONMENT: Lives with: lives with their spouse Lives in:  House/apartment Stairs: Yes: External: 2 steps; has grab bar Has following equipment at home: Dan Humphreys - 2 wheeled, Environmental consultant - 4 wheeled, and Grab bars  PLOF: Independent with household mobility with device, Independent with community mobility with device, Independent with transfers, and Requires assistive device for independence  PATIENT GOALS: Get to where I can walk better again and get my legs stronger.   OBJECTIVE:    TODAY'S TREATMENT:                                                                                                                              DATE: 10/22/22   Therex:    O2 sat= 96% upon walking into clinic  2 min step test= 38 steps on right and retested O2 sat= 95%   Seated running man- lifting UE/opp LE x 20 reps each LE  Sit to stand x 15 without UE support including BUE shoulder raises. O2 sat=95%  Resistive gait 330 feet using 4WW using 3# AW, CGA- cues for increased step length and erect standing- 2 min 12 sec total time  Standing scap  retraction BTB 2 sets  x 15 (VC for UE position)   Seated hip march- 3# AW- 2 sets of 10 reps (VC for increased step height)   Standing hip march (High knee walk) in // bars - down and retro walk back x 8  (VC for increased step height and larger retro steps)   Cervical AROM- Rotation 20 reps left to right  Cervical AROM- Sidebending- 20 reps - focusing more on SB to right to increased left UT tightness.   Cervical PROM cervical Rotation and SB x 6 min   Pt required occasional rest breaks due fatigue, PT was quick to ask when pt appeared to be fatiguing in order to prevent excessive fatigue.        PATIENT EDUCATION: Education details: Plan of care; purpose of functional outcome measures.  Person educated: Patient Education method: Explanation, Demonstration, Tactile cues, Verbal cues, and Handouts Education comprehension: verbalized understanding, returned demonstration, verbal cues required, tactile cues  required, and needs further education  HOME EXERCISE PROGRAM: Access Code: [email or web address] URL: [email or web address] Date: 08/11/2022 Prepared by: Maureen Ralphs  Exercises - Seated Lumbar Flexion Stretch  - 7 x weekly - 3 sets - 30 sec hold - Seated Thoracic Flexion and Rotation with Arms Crossed  - 1 x daily - 7 x weekly - 3 sets - 30 sec hold - Seated Figure 4 Piriformis Stretch  - 1 x daily - 7 x weekly - 3 sets - 10 reps  GOALS: Goals reviewed with patient? Yes  SHORT TERM GOALS: Target date: 09/20/2022  Pt will be independent with a comprehensive HEP in order to improve strength and balance in order to decrease fall risk and improve function at home and work.  Baseline: EVAL: Patient admits not performing much exercises - not moving around well.  Goal status: INITIAL   LONG TERM GOALS: Target date: 11/01/2022  Pt will improve FOTO to target score of 45 % to display perceived improvements in ability to complete ADL's.  Baseline: EVAL: 42 Goal status: INITIAL  2.  Pt will decrease 5TSTS by at least 5 seconds in order to demonstrate clinically significant improvement in LE strength.  Baseline: EVAL = 17.39 sec without UE support Goal status: INITIAL  3. Pt will increase by at least 0.15 m/s in order to demonstrate clinically significant improvement in community ambulation   Baseline: EVAL= 0.67 m/s Goal status: INITIAL  4.  Pt will improve BERG by at least 3 points in order to demonstrate clinically significant improvement in balance.   Baseline: EVAL: to be assessed next visit: 08/23/2022= 36/56 Goal status: INITIAL  5.  Pt will decrease TUG to below 14 seconds/decrease in order to demonstrate decreased fall risk. Baseline: EVAL: 22.00 sec with upright 4WW Goal status: INITIAL  6.  Pt will increase by at least 87m (126ft) in order to demonstrate clinically significant improvement in cardiopulmonary endurance and community ambulation  Baseline:  EVAL: 3:45 min (450 feet with upright 4WW) Goal status: INITIAL 7.   Patient will report returning to walking in to community places using most appropriate assistive device vs current use of scooter for improved abilities with mobility during community outings Baseline: EVAL: Current using scooter for many social/community outings  Goal status: INITIAL ASSESSMENT:  CLINICAL IMPRESSION:   Patient presents with increased observed left UT tightness and listing to left today. Added more cervical ROM and reminded patient to perform daily as part of his HEP. He continues to present with good motivation and  desire to improve his mobility- remains responsive to [facility] for step height yet limited by overall fatigue/weakness. Pt will continue to benefit from skilled PT services to address deficits and impairment identified in evaluation in order to maximize independence and safety in basic mobility required for performance of ADL, IADL, and leisure.    OBJECTIVE IMPAIRMENTS: Abnormal gait, cardiopulmonary status limiting activity, decreased activity tolerance, decreased balance, decreased coordination, decreased endurance, decreased mobility, difficulty walking, decreased ROM, decreased strength, hypomobility, impaired flexibility, impaired sensation, impaired UE functional use, postural dysfunction, obesity, and pain.   ACTIVITY LIMITATIONS: carrying, lifting, bending, standing, squatting, stairs, transfers, continence, and reach over head  PARTICIPATION LIMITATIONS: meal prep, cleaning, laundry, driving, shopping, community activity, and yard work  PERSONAL FACTORS: Age and 3+ comorbidities: HTN, Arthritis, Low back pain, Trigeminal Neuralgia  are also affecting patient's functional outcome.   REHAB POTENTIAL: Good  CLINICAL DECISION MAKING: Evolving/moderate complexity  EVALUATION COMPLEXITY: Moderate  PLAN:  PT FREQUENCY: 1-2x/week  PT DURATION: 12 weeks  PLANNED INTERVENTIONS: Therapeutic  exercises, Therapeutic activity, Neuromuscular re-education, Balance training, Gait training, Patient/Family education, Self Care, Joint mobilization, Stair training, Vestibular training, Canalith repositioning, DME instructions, Dry Needling, Electrical stimulation, Spinal manipulation, Spinal mobilization, Cryotherapy, Moist heat, Manual therapy, and Re-evaluation  PLAN FOR NEXT SESSION: Progress therex, bed mobility, and balance training. Initiate training at [facility] for UE/LE strengthening and cardiovascular training.    Lenda Kelp, PT 10/22/2022, 10:44 AM  10:44 AM, 10/22/22  Physical Therapist - [facility]  Outpatient Physical Therapy- Main Campus [phone]

## 2022-10-25 ENCOUNTER — Ambulatory Visit: Payer: Medicare HMO

## 2022-10-25 DIAGNOSIS — R269 Unspecified abnormalities of gait and mobility: Secondary | ICD-10-CM

## 2022-10-25 DIAGNOSIS — R262 Difficulty in walking, not elsewhere classified: Secondary | ICD-10-CM

## 2022-10-25 DIAGNOSIS — R2681 Unsteadiness on feet: Secondary | ICD-10-CM

## 2022-10-25 DIAGNOSIS — M6281 Muscle weakness (generalized): Secondary | ICD-10-CM

## 2022-10-25 NOTE — Therapy (Signed)
OUTPATIENT PHYSICAL THERAPY NEURO TREATMENT/Physical Therapy Progress Note   Dates of reporting period  09/15/2022   to   10/25/2022     Patient Name: [name] MRN: [id number] DOB:1945/09/11, 77 y.o., male Today's Date: 10/25/2022   PCP: Ardyth Man, PA- C REFERRING PROVIDER: Morene Crocker, MD  END OF SESSION:  PT End of Session - 10/25/22 1323     Visit Number 20    Number of Visits 24    Date for PT Re-Evaluation 11/01/22    Authorization Type [employer]    Progress Note Due on Visit 30    PT Start Time 1315    PT Stop Time 1355    PT Time Calculation (min) 40 min    Equipment Utilized During Treatment Gait belt    Activity Tolerance Patient tolerated treatment well;No increased pain;Patient limited by pain    Behavior During Therapy [facility] for tasks assessed/performed                     Past Medical History:  Diagnosis Date   Anxiety    Arteriosclerosis of coronary artery 11/16/2011   Overview:  Stent 10/2011 stent rca 2015 with collaterals to lad which is chronically occluded    Benign enlargement of prostate    Benign essential HTN 06/11/2014   Benign prostatic hypertrophy without urinary obstruction 07/31/2014   Bilateral cataracts 05/16/2013   Overview:  [facility]     Bone spur of foot    Left   BP (high blood pressure) 11/09/2012   Cancer (HCC)    skin (forehead) and bladder   Carotid artery narrowing 02/08/2014   Depression    Detrusor hypertrophy    Diabetes (HCC)    Diabetes mellitus, type 2 (HCC) 12/12/2012   Diverticulosis    Dyspnea    Esophageal reflux    Esophageal reflux    Fothergill's neuralgia 08/07/2012   Overview:  [facility]    Gastritis    GERD (gastroesophageal reflux disease)    Headache    cluster headaches   Healed myocardial infarct 11/09/2012   Hearing loss in left ear    Heart disease    Hematuria    Hemorrhoids    History of hiatal hernia 12/14/2017   small    Hypercholesteremia     Lesion of bladder    Myocardial infarct (HCC)    Presence of stent in coronary artery 11/09/2012   Rectal bleeding    Trigeminal neuralgia    Trigeminal neuralgia    Valvular heart disease    Vitamin D deficiency    Past Surgical History:  Procedure Laterality Date   APPENDECTOMY     BOTOX INJECTION N/A 12/21/2017   Procedure: Bladder BOTOX INJECTION;  Surgeon: Vanna Scotland, MD;  Location: [facility] ORS;  Service: Urology;  Laterality: N/A;   BOTOX INJECTION N/A 09/11/2018   Procedure: Bladder BOTOX INJECTION;  Surgeon: Vanna Scotland, MD;  Location: [facility] ORS;  Service: Urology;  Laterality: N/A;   CARDIAC CATHETERIZATION     CARDIAC CATHETERIZATION N/A 01/22/2015   Procedure: Left Heart Cath;  Surgeon: Lamar Blinks, MD;  Location: [facility] INVASIVE CV LAB;  Service: Cardiovascular;  Laterality: N/A;   CARDIAC CATHETERIZATION N/A 01/22/2015   Procedure: Coronary Stent Intervention;  Surgeon: Marcina Millard, MD;  Location: [facility] INVASIVE CV LAB;  Service: Cardiovascular;  Laterality: N/A;   CATARACT EXTRACTION, BILATERAL     COLONOSCOPY WITH PROPOFOL N/A 12/08/2015   Procedure: COLONOSCOPY WITH PROPOFOL;  Surgeon: Christena Deem, MD;  Location: [facility] ENDOSCOPY;  Service: Endoscopy;  Laterality: N/A;   COLONOSCOPY WITH PROPOFOL N/A 12/09/2015   Procedure: COLONOSCOPY WITH PROPOFOL;  Surgeon: Christena Deem, MD;  Location: [facility] ENDOSCOPY;  Service: Endoscopy;  Laterality: N/A;   CORONARY ANGIOPLASTY     5 stents   CORONARY STENT PLACEMENT  2015   x5   CYSTOSCOPY N/A 09/11/2018   Procedure: CYSTOSCOPY;  Surgeon: Vanna Scotland, MD;  Location: [facility] ORS;  Service: Urology;  Laterality: N/A;   CYSTOSCOPY WITH BIOPSY N/A 12/21/2017   Procedure: CYSTOSCOPY WITH BIOPSY;  Surgeon: Vanna Scotland, MD;  Location: [facility] ORS;  Service: Urology;  Laterality: N/A;   ESOPHAGOGASTRODUODENOSCOPY (EGD) WITH PROPOFOL N/A 12/08/2015   Procedure: ESOPHAGOGASTRODUODENOSCOPY (EGD) WITH PROPOFOL;   Surgeon: Christena Deem, MD;  Location: [facility] ENDOSCOPY;  Service: Endoscopy;  Laterality: N/A;   ESOPHAGOGASTRODUODENOSCOPY (EGD) WITH PROPOFOL N/A 12/13/2017   Procedure: ESOPHAGOGASTRODUODENOSCOPY (EGD) WITH PROPOFOL;  Surgeon: Christena Deem, MD;  Location: [facility] ENDOSCOPY;  Service: Endoscopy;  Laterality: N/A;   EYE SURGERY     HERNIA REPAIR     kidney tumor remove     TRANSURETHRAL RESECTION OF BLADDER TUMOR WITH GYRUS (TURBT-GYRUS)  12/2013   UMBILICAL HERNIA REPAIR     urethral meatotomy     Patient Active Problem List   Diagnosis Date Noted   Class 2 obesity due to excess calories with body mass index (BMI) of 36.0 to 36.9 in adult 04/11/2020   Swelling of limb 03/28/2020   Lymphedema 03/28/2020   Trigeminal neuralgia 12/25/2019   Lower limb ulcer, calf, left, limited to breakdown of skin (HCC) 12/25/2019   OSA (obstructive sleep apnea) 01/23/2019   Acute systolic CHF (congestive heart failure) (HCC) 12/12/2018   Bruising 07/10/2018   Diet-controlled type 2 diabetes mellitus (HCC) 03/24/2018   Microalbuminuria 03/24/2018   Dizziness 11/17/2017   Simple chronic bronchitis (HCC) 09/22/2017   SOBOE (shortness of breath on exertion) 02/18/2017   Chronic midline low back pain without sciatica 12/29/2016   Periodic limb movement disorder 12/29/2016   Benign essential tremor 06/22/2016   Pure hypercholesterolemia 06/20/2015   Erectile dysfunction due to arterial insufficiency 06/16/2015   Unstable angina (HCC) 01/22/2015   Abdominal aortic 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 rupture (HCC) 12/31/2014   Benign prostatic hypertrophy without urinary obstruction 07/31/2014   Enlarged prostate 07/31/2014   Benign essential HTN 06/11/2014   Carotid artery narrowing 02/08/2014   Carotid artery obstruction 02/08/2014   Bilateral carotid artery stenosis 02/08/2014   Chest pain 08/20/2013   Bilateral cataracts 05/16/2013   Cataract 05/16/2013   Clinical depression 12/12/2012   Diabetes  mellitus, type 2 (HCC) 12/12/2012   Combined fat and carbohydrate induced hyperlipemia 12/12/2012   Major depressive disorder, single episode, unspecified 12/12/2012   Acid reflux 11/09/2012   Presence of stent in coronary artery 11/09/2012   BP (high blood pressure) 11/09/2012   Healed myocardial infarct 11/09/2012   Gastro-esophageal reflux disease without esophagitis 11/09/2012   History of cardiovascular surgery 11/09/2012   Fothergill's neuralgia 08/07/2012   Swelling of testicle 04/06/2012   Disorder of male genital organ 04/06/2012   Swelling of the testicles 04/06/2012   Arteriosclerosis of coronary artery 11/16/2011   CAD in native artery 11/16/2011   Fatigue 06/04/2011   Avitaminosis D 11/30/2010    ONSET DATE: 03/17/2022- Worsening symptoms  REFERRING DIAG:  G20.A1 (ICD-10-CM) - Parkinsons  R26.2 (ICD-10-CM) - Difficulty walking    THERAPY DIAG:  Difficulty in walking, not elsewhere  classified  Muscle weakness (generalized)  Abnormality of gait and mobility  Unsteadiness on feet  Rationale for Evaluation and Treatment: Rehabilitation  SUBJECTIVE:                                                                                                                                                                                             SUBJECTIVE STATEMENT:  Patient reports having a pretty good weekend with no new issues.       Patient reports 0/10  Pt accompanied by: significant other  PERTINENT HISTORY: Patient is a 77 year old male with referral to outpatient PT for Parkinsons and difficulty walking. He was recently seen by Neurology worsening symptoms of parkinson- difficulty with walking. He has past medical history of Arthritis, Bladder cancer, and Trigeminal Neuralgia.  PAIN:  Are you having pain? NO  PRECAUTIONS: Fall  WEIGHT BEARING RESTRICTIONS: No  FALLS: Has patient fallen in last 6 months? Yes. Number of falls 1  LIVING ENVIRONMENT: Lives  with: lives with their spouse Lives in: House/apartment Stairs: Yes: External: 2 steps; has grab bar Has following equipment at home: Dan Humphreys - 2 wheeled, Environmental consultant - 4 wheeled, and Grab bars  PLOF: Independent with household mobility with device, Independent with community mobility with device, Independent with transfers, and Requires assistive device for independence  PATIENT GOALS: Get to where I can walk better again and get my legs stronger.   OBJECTIVE:    TODAY'S TREATMENT:                                                                                                                              DATE: 10/25/22   Physical therapy treatment session today consisted of completing assessment of goals and administration of testing as demonstrated and documented in flow sheet, treatment, and goals section of this note. Addition treatments may be found below.    Pt required occasional rest breaks due fatigue, PT was quick to ask when pt appeared to be fatiguing in order to prevent excessive fatigue.        PATIENT EDUCATION:  Education details: Plan of care; purpose of functional outcome measures.  Person educated: Patient Education method: Explanation, Demonstration, Tactile cues, Verbal cues, and Handouts Education comprehension: verbalized understanding, returned demonstration, verbal cues required, tactile cues required, and needs further education  HOME EXERCISE PROGRAM: Access Code: [email or web address] URL: [email or web address] Date: 08/11/2022 Prepared by: Maureen Ralphs  Exercises - Seated Lumbar Flexion Stretch  - 7 x weekly - 3 sets - 30 sec hold - Seated Thoracic Flexion and Rotation with Arms Crossed  - 1 x daily - 7 x weekly - 3 sets - 30 sec hold - Seated Figure 4 Piriformis Stretch  - 1 x daily - 7 x weekly - 3 sets - 10 reps  GOALS: Goals reviewed with patient? Yes  SHORT TERM GOALS: Target date: 09/20/2022  Pt will be independent with a comprehensive  HEP in order to improve strength and balance in order to decrease fall risk and improve function at home and work.  Baseline: EVAL: Patient admits not performing much exercises - not moving around well. 10/25/2022-Patient reports not as compliant with previous HEP as instructed- Verbally reviewed Low back, cervical, thoracic stretching and importance of daily walking. Goal status: ONGOING   LONG TERM GOALS: Target date: 11/01/2022  Pt will improve FOTO to target score of 45 % to display perceived improvements in ability to complete ADL's.  Baseline: EVAL: 42 Goal status: INITIAL  2.  Pt will decrease 5TSTS by at least 5 seconds in order to demonstrate clinically significant improvement in LE strength.  Baseline: EVAL = 17.39 sec without UE support; 10/25/2022= 12.91 sec Goal status: PROGRESSING  3. Pt will increase by at least 0.15 m/s in order to demonstrate clinically significant improvement in community ambulation   Baseline: EVAL= 0.67 m/s; 10/25/2022= 0.77 m/s Goal status: PROGRESSING  4.  Pt will improve BERG by at least 3 points in order to demonstrate clinically significant improvement in balance.   Baseline: EVAL: to be assessed next visit: 08/23/2022= 36/56 Goal status: INITIAL  5.  Pt will decrease TUG to below 14 seconds/decrease in order to demonstrate decreased fall risk. Baseline: EVAL: 22.00 sec with upright 4WW; 10/25/2022= 15.98 sec with 4WW Goal status: PROGRESSING  6.  Pt will increase by at least 49m (162ft) in order to demonstrate clinically significant improvement in cardiopulmonary endurance and community ambulation  Baseline: EVAL: 3:45 min (450 feet with upright 4WW) 10/25/2022= 830 feet with 4WW Goal status: MET  7.   Patient will report returning to walking in to community places using most appropriate assistive device vs current use of scooter for improved abilities with mobility during community outings Baseline: EVAL: Current using scooter for many  social/community outings; 10/25/2022- Paitent  Goal status: INITIAL ASSESSMENT:  CLINICAL IMPRESSION:  Patient presents with good motivation for today's progress visit. He was able to demo good progress overall including making progress with 5x STS, TUG, 10 MWT, and actually met his 6 min walk test goal. He required minimal VC to take larger steps but very much improved since his most recent evaluation.  Patient's condition has the potential to improve in response to therapy. Maximum improvement is yet to be obtained. The anticipated improvement is attainable and reasonable in a generally predictable time.  Patient reports   Pt will continue to benefit from skilled PT services to address deficits and impairment identified in evaluation in order to maximize independence and safety in basic mobility required for performance of ADL, IADL, and leisure.    OBJECTIVE IMPAIRMENTS:  Abnormal gait, cardiopulmonary status limiting activity, decreased activity tolerance, decreased balance, decreased coordination, decreased endurance, decreased mobility, difficulty walking, decreased ROM, decreased strength, hypomobility, impaired flexibility, impaired sensation, impaired UE functional use, postural dysfunction, obesity, and pain.   ACTIVITY LIMITATIONS: carrying, lifting, bending, standing, squatting, stairs, transfers, continence, and reach over head  PARTICIPATION LIMITATIONS: meal prep, cleaning, laundry, driving, shopping, community activity, and yard work  PERSONAL FACTORS: Age and 3+ comorbidities: HTN, Arthritis, Low back pain, Trigeminal Neuralgia  are also affecting patient's functional outcome.   REHAB POTENTIAL: Good  CLINICAL DECISION MAKING: Evolving/moderate complexity  EVALUATION COMPLEXITY: Moderate  PLAN:  PT FREQUENCY: 1-2x/week  PT DURATION: 12 weeks  PLANNED INTERVENTIONS: Therapeutic exercises, Therapeutic activity, Neuromuscular re-education, Balance training, Gait training,  Patient/Family education, Self Care, Joint mobilization, Stair training, Vestibular training, Canalith repositioning, DME instructions, Dry Needling, Electrical stimulation, Spinal manipulation, Spinal mobilization, Cryotherapy, Moist heat, Manual therapy, and Re-evaluation  PLAN FOR NEXT SESSION: Progress therex, bed mobility, and balance training. Initiate training at [facility] for UE/LE strengthening and cardiovascular training.    Lenda Kelp, PT 10/25/2022, 3:01 PM  3:01 PM, 10/25/22  Physical Therapist - [facility]  Outpatient Physical Therapy- Main Campus [phone]

## 2022-10-26 ENCOUNTER — Ambulatory Visit: Payer: Medicare HMO | Admitting: Dermatology

## 2022-10-28 ENCOUNTER — Ambulatory Visit: Payer: Medicare HMO

## 2022-10-28 DIAGNOSIS — M6281 Muscle weakness (generalized): Secondary | ICD-10-CM

## 2022-10-28 DIAGNOSIS — R269 Unspecified abnormalities of gait and mobility: Secondary | ICD-10-CM

## 2022-10-28 DIAGNOSIS — R262 Difficulty in walking, not elsewhere classified: Secondary | ICD-10-CM | POA: Diagnosis not present

## 2022-10-28 DIAGNOSIS — R2681 Unsteadiness on feet: Secondary | ICD-10-CM

## 2022-10-28 DIAGNOSIS — R278 Other lack of coordination: Secondary | ICD-10-CM

## 2022-10-28 DIAGNOSIS — R2689 Other abnormalities of gait and mobility: Secondary | ICD-10-CM

## 2022-10-28 DIAGNOSIS — M545 Low back pain, unspecified: Secondary | ICD-10-CM

## 2022-10-28 NOTE — Therapy (Signed)
OUTPATIENT PHYSICAL THERAPY NEURO TREATMENT   Patient Name: [name] MRN: [id number] DOB:October 25, 1945, 77 y.o., male Today's Date: 10/28/2022   PCP: Ardyth Man, PA- C REFERRING PROVIDER: Morene Crocker, MD  END OF SESSION:  PT End of Session - 10/28/22 1408     Visit Number 21    Number of Visits 24    Date for PT Re-Evaluation 11/01/22    Authorization Type [employer]    Progress Note Due on Visit 30    PT Start Time 1401    PT Stop Time 1442    PT Time Calculation (min) 41 min    Equipment Utilized During Treatment Gait belt    Activity Tolerance Patient tolerated treatment well;No increased pain    Behavior During Therapy [facility] for tasks assessed/performed                     Past Medical History:  Diagnosis Date   Anxiety    Arteriosclerosis of coronary artery 11/16/2011   Overview:  Stent 10/2011 stent rca 2015 with collaterals to lad which is chronically occluded    Benign enlargement of prostate    Benign essential HTN 06/11/2014   Benign prostatic hypertrophy without urinary obstruction 07/31/2014   Bilateral cataracts 05/16/2013   Overview:  [facility]     Bone spur of foot    Left   BP (high blood pressure) 11/09/2012   Cancer (HCC)    skin (forehead) and bladder   Carotid artery narrowing 02/08/2014   Depression    Detrusor hypertrophy    Diabetes (HCC)    Diabetes mellitus, type 2 (HCC) 12/12/2012   Diverticulosis    Dyspnea    Esophageal reflux    Esophageal reflux    Fothergill's neuralgia 08/07/2012   Overview:  [facility]    Gastritis    GERD (gastroesophageal reflux disease)    Headache    cluster headaches   Healed myocardial infarct 11/09/2012   Hearing loss in left ear    Heart disease    Hematuria    Hemorrhoids    History of hiatal hernia 12/14/2017   small    Hypercholesteremia    Lesion of bladder    Myocardial infarct (HCC)    Presence of stent in coronary artery 11/09/2012   Rectal bleeding     Trigeminal neuralgia    Trigeminal neuralgia    Valvular heart disease    Vitamin D deficiency    Past Surgical History:  Procedure Laterality Date   APPENDECTOMY     BOTOX INJECTION N/A 12/21/2017   Procedure: Bladder BOTOX INJECTION;  Surgeon: Vanna Scotland, MD;  Location: [facility] ORS;  Service: Urology;  Laterality: N/A;   BOTOX INJECTION N/A 09/11/2018   Procedure: Bladder BOTOX INJECTION;  Surgeon: Vanna Scotland, MD;  Location: [facility] ORS;  Service: Urology;  Laterality: N/A;   CARDIAC CATHETERIZATION     CARDIAC CATHETERIZATION N/A 01/22/2015   Procedure: Left Heart Cath;  Surgeon: Lamar Blinks, MD;  Location: [facility] INVASIVE CV LAB;  Service: Cardiovascular;  Laterality: N/A;   CARDIAC CATHETERIZATION N/A 01/22/2015   Procedure: Coronary Stent Intervention;  Surgeon: Marcina Millard, MD;  Location: [facility] INVASIVE CV LAB;  Service: Cardiovascular;  Laterality: N/A;   CATARACT EXTRACTION, BILATERAL     COLONOSCOPY WITH PROPOFOL N/A 12/08/2015   Procedure: COLONOSCOPY WITH PROPOFOL;  Surgeon: Christena Deem, MD;  Location: [facility] ENDOSCOPY;  Service: Endoscopy;  Laterality: N/A;   COLONOSCOPY WITH PROPOFOL N/A  12/09/2015   Procedure: COLONOSCOPY WITH PROPOFOL;  Surgeon: Christena Deem, MD;  Location: [facility] ENDOSCOPY;  Service: Endoscopy;  Laterality: N/A;   CORONARY ANGIOPLASTY     5 stents   CORONARY STENT PLACEMENT  2015   x5   CYSTOSCOPY N/A 09/11/2018   Procedure: CYSTOSCOPY;  Surgeon: Vanna Scotland, MD;  Location: [facility] ORS;  Service: Urology;  Laterality: N/A;   CYSTOSCOPY WITH BIOPSY N/A 12/21/2017   Procedure: CYSTOSCOPY WITH BIOPSY;  Surgeon: Vanna Scotland, MD;  Location: [facility] ORS;  Service: Urology;  Laterality: N/A;   ESOPHAGOGASTRODUODENOSCOPY (EGD) WITH PROPOFOL N/A 12/08/2015   Procedure: ESOPHAGOGASTRODUODENOSCOPY (EGD) WITH PROPOFOL;  Surgeon: Christena Deem, MD;  Location: [facility] ENDOSCOPY;  Service: Endoscopy;  Laterality: N/A;    ESOPHAGOGASTRODUODENOSCOPY (EGD) WITH PROPOFOL N/A 12/13/2017   Procedure: ESOPHAGOGASTRODUODENOSCOPY (EGD) WITH PROPOFOL;  Surgeon: Christena Deem, MD;  Location: [facility] ENDOSCOPY;  Service: Endoscopy;  Laterality: N/A;   EYE SURGERY     HERNIA REPAIR     kidney tumor remove     TRANSURETHRAL RESECTION OF BLADDER TUMOR WITH GYRUS (TURBT-GYRUS)  12/2013   UMBILICAL HERNIA REPAIR     urethral meatotomy     Patient Active Problem List   Diagnosis Date Noted   Class 2 obesity due to excess calories with body mass index (BMI) of 36.0 to 36.9 in adult 04/11/2020   Swelling of limb 03/28/2020   Lymphedema 03/28/2020   Trigeminal neuralgia 12/25/2019   Lower limb ulcer, calf, left, limited to breakdown of skin (HCC) 12/25/2019   OSA (obstructive sleep apnea) 01/23/2019   Acute systolic CHF (congestive heart failure) (HCC) 12/12/2018   Bruising 07/10/2018   Diet-controlled type 2 diabetes mellitus (HCC) 03/24/2018   Microalbuminuria 03/24/2018   Dizziness 11/17/2017   Simple chronic bronchitis (HCC) 09/22/2017   SOBOE (shortness of breath on exertion) 02/18/2017   Chronic midline low back pain without sciatica 12/29/2016   Periodic limb movement disorder 12/29/2016   Benign essential tremor 06/22/2016   Pure hypercholesterolemia 06/20/2015   Erectile dysfunction due to arterial insufficiency 06/16/2015   Unstable angina (HCC) 01/22/2015   Abdominal aortic 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 rupture (HCC) 12/31/2014   Benign prostatic hypertrophy without urinary obstruction 07/31/2014   Enlarged prostate 07/31/2014   Benign essential HTN 06/11/2014   Carotid artery narrowing 02/08/2014   Carotid artery obstruction 02/08/2014   Bilateral carotid artery stenosis 02/08/2014   Chest pain 08/20/2013   Bilateral cataracts 05/16/2013   Cataract 05/16/2013   Clinical depression 12/12/2012   Diabetes mellitus, type 2 (HCC) 12/12/2012   Combined fat and carbohydrate induced hyperlipemia 12/12/2012    Major depressive disorder, single episode, unspecified 12/12/2012   Acid reflux 11/09/2012   Presence of stent in coronary artery 11/09/2012   BP (high blood pressure) 11/09/2012   Healed myocardial infarct 11/09/2012   Gastro-esophageal reflux disease without esophagitis 11/09/2012   History of cardiovascular surgery 11/09/2012   Fothergill's neuralgia 08/07/2012   Swelling of testicle 04/06/2012   Disorder of male genital organ 04/06/2012   Swelling of the testicles 04/06/2012   Arteriosclerosis of coronary artery 11/16/2011   CAD in native artery 11/16/2011   Fatigue 06/04/2011   Avitaminosis D 11/30/2010    ONSET DATE: 03/17/2022- Worsening symptoms  REFERRING DIAG:  G20.A1 (ICD-10-CM) - Parkinsons  R26.2 (ICD-10-CM) - Difficulty walking    THERAPY DIAG:  Difficulty in walking, not elsewhere classified  Muscle weakness (generalized)  Abnormality of gait and mobility  Unsteadiness on feet  Other abnormalities of gait and  mobility  Other lack of coordination  Chronic bilateral low back pain without sciatica  Rationale for Evaluation and Treatment: Rehabilitation  SUBJECTIVE:                                                                                                                                                                                             SUBJECTIVE STATEMENT:  Patient reports being on golf cart the other day and the cart broke down and he had to walk several hundred feet without a device.       Patient reports 0/10  Pt accompanied by: significant other  PERTINENT HISTORY: Patient is a 77 year old male with referral to outpatient PT for Parkinsons and difficulty walking. He was recently seen by Neurology worsening symptoms of parkinson- difficulty with walking. He has past medical history of Arthritis, Bladder cancer, and Trigeminal Neuralgia.  PAIN:  Are you having pain? NO  PRECAUTIONS: Fall  WEIGHT BEARING RESTRICTIONS: No  FALLS:  Has patient fallen in last 6 months? Yes. Number of falls 1  LIVING ENVIRONMENT: Lives with: lives with their spouse Lives in: House/apartment Stairs: Yes: External: 2 steps; has grab bar Has following equipment at home: Dan Humphreys - 2 wheeled, Environmental consultant - 4 wheeled, and Grab bars  PLOF: Independent with household mobility with device, Independent with community mobility with device, Independent with transfers, and Requires assistive device for independence  PATIENT GOALS: Get to where I can walk better again and get my legs stronger.   OBJECTIVE:    TODAY'S TREATMENT:                                                                                                                              DATE: 10/28/22    Seated therex:  Seated hip march (3# AW) - 2 sets of 12 reps alt LE Seated Hip flex/abd/add up/over cone 3# AW x 12 reps each LE Seated knee ext- 3# x 15 reps (stopped secondary to fatigue)  Seated ham curl with GTB- 2 sets of 12 reps Resistive gait - 3# AW - 300  feet  Sit to stand x 15 with UE raises (VC for posture).  Step tap (from purple airex pad to first step without UE Support) x 20 reps each LE  Pt required occasional rest breaks due fatigue, PT was quick to ask when pt appeared to be fatiguing in order to prevent excessive fatigue.        PATIENT EDUCATION: Education details: Plan of care; purpose of functional outcome measures.  Person educated: Patient Education method: Explanation, Demonstration, Tactile cues, Verbal cues, and Handouts Education comprehension: verbalized understanding, returned demonstration, verbal cues required, tactile cues required, and needs further education  HOME EXERCISE PROGRAM: Access Code: [email or web address] URL: [email or web address] Date: 08/11/2022 Prepared by: Maureen Ralphs  Exercises - Seated Lumbar Flexion Stretch  - 7 x weekly - 3 sets - 30 sec hold - Seated Thoracic Flexion and Rotation with Arms Crossed  - 1 x  daily - 7 x weekly - 3 sets - 30 sec hold - Seated Figure 4 Piriformis Stretch  - 1 x daily - 7 x weekly - 3 sets - 10 reps  GOALS: Goals reviewed with patient? Yes  SHORT TERM GOALS: Target date: 09/20/2022  Pt will be independent with a comprehensive HEP in order to improve strength and balance in order to decrease fall risk and improve function at home and work.  Baseline: EVAL: Patient admits not performing much exercises - not moving around well. 10/25/2022-Patient reports not as compliant with previous HEP as instructed- Verbally reviewed Low back, cervical, thoracic stretching and importance of daily walking. Goal status: ONGOING   LONG TERM GOALS: Target date: 11/01/2022  Pt will improve FOTO to target score of 45 % to display perceived improvements in ability to complete ADL's.  Baseline: EVAL: 42 Goal status: INITIAL  2.  Pt will decrease 5TSTS by at least 5 seconds in order to demonstrate clinically significant improvement in LE strength.  Baseline: EVAL = 17.39 sec without UE support; 10/25/2022= 12.91 sec Goal status: PROGRESSING  3. Pt will increase by at least 0.15 m/s in order to demonstrate clinically significant improvement in community ambulation   Baseline: EVAL= 0.67 m/s; 10/25/2022= 0.77 m/s Goal status: PROGRESSING  4.  Pt will improve BERG by at least 3 points in order to demonstrate clinically significant improvement in balance.   Baseline: EVAL: to be assessed next visit: 08/23/2022= 36/56 Goal status: INITIAL  5.  Pt will decrease TUG to below 14 seconds/decrease in order to demonstrate decreased fall risk. Baseline: EVAL: 22.00 sec with upright 4WW; 10/25/2022= 15.98 sec with 4WW Goal status: PROGRESSING  6.  Pt will increase by at least 6m (174ft) in order to demonstrate clinically significant improvement in cardiopulmonary endurance and community ambulation  Baseline: EVAL: 3:45 min (450 feet with upright 4WW) 10/25/2022= 830 feet with 4WW Goal  status: MET  7.   Patient will report returning to walking in to community places using most appropriate assistive device vs current use of scooter for improved abilities with mobility during community outings Baseline: EVAL: Current using scooter for many social/community outings; 10/25/2022- Paitent  Goal status: INITIAL    ASSESSMENT:  CLINICAL IMPRESSION:  Patient entered today with complaint of fatigue overall - likely due to excessive heat. He responded well- treated more in seated position due to fatigue with slightly more rest breaks overall today. No significant difficulty other than fatigue today.   Pt will continue to benefit from skilled PT services to address deficits and impairment identified in evaluation in order  to maximize independence and safety in basic mobility required for performance of ADL, IADL, and leisure.    OBJECTIVE IMPAIRMENTS: Abnormal gait, cardiopulmonary status limiting activity, decreased activity tolerance, decreased balance, decreased coordination, decreased endurance, decreased mobility, difficulty walking, decreased ROM, decreased strength, hypomobility, impaired flexibility, impaired sensation, impaired UE functional use, postural dysfunction, obesity, and pain.   ACTIVITY LIMITATIONS: carrying, lifting, bending, standing, squatting, stairs, transfers, continence, and reach over head  PARTICIPATION LIMITATIONS: meal prep, cleaning, laundry, driving, shopping, community activity, and yard work  PERSONAL FACTORS: Age and 3+ comorbidities: HTN, Arthritis, Low back pain, Trigeminal Neuralgia  are also affecting patient's functional outcome.   REHAB POTENTIAL: Good  CLINICAL DECISION MAKING: Evolving/moderate complexity  EVALUATION COMPLEXITY: Moderate  PLAN:  PT FREQUENCY: 1-2x/week  PT DURATION: 12 weeks  PLANNED INTERVENTIONS: Therapeutic exercises, Therapeutic activity, Neuromuscular re-education, Balance training, Gait training,  Patient/Family education, Self Care, Joint mobilization, Stair training, Vestibular training, Canalith repositioning, DME instructions, Dry Needling, Electrical stimulation, Spinal manipulation, Spinal mobilization, Cryotherapy, Moist heat, Manual therapy, and Re-evaluation  PLAN FOR NEXT SESSION: Progress therex, bed mobility, and balance training. Initiate training at [facility] for UE/LE strengthening and cardiovascular training.    Lenda Kelp, PT 10/28/2022, 2:40 PM  2:40 PM, 10/28/22  Physical Therapist - [facility]  Outpatient Physical Therapy- Main Campus [phone]

## 2022-11-03 ENCOUNTER — Ambulatory Visit: Payer: Medicare HMO | Attending: Neurology

## 2022-11-03 DIAGNOSIS — M545 Low back pain, unspecified: Secondary | ICD-10-CM | POA: Insufficient documentation

## 2022-11-03 DIAGNOSIS — M6281 Muscle weakness (generalized): Secondary | ICD-10-CM | POA: Diagnosis not present

## 2022-11-03 DIAGNOSIS — G8929 Other chronic pain: Secondary | ICD-10-CM | POA: Diagnosis not present

## 2022-11-03 DIAGNOSIS — R269 Unspecified abnormalities of gait and mobility: Secondary | ICD-10-CM

## 2022-11-03 DIAGNOSIS — R2681 Unsteadiness on feet: Secondary | ICD-10-CM

## 2022-11-03 DIAGNOSIS — R2689 Other abnormalities of gait and mobility: Secondary | ICD-10-CM | POA: Diagnosis not present

## 2022-11-03 DIAGNOSIS — R262 Difficulty in walking, not elsewhere classified: Secondary | ICD-10-CM

## 2022-11-03 DIAGNOSIS — R278 Other lack of coordination: Secondary | ICD-10-CM

## 2022-11-03 NOTE — Therapy (Signed)
OUTPATIENT PHYSICAL THERAPY NEURO TREATMENT/RECERT   Patient Name: [name] MRN: [id number] DOB:1945/06/14, 77 y.o., male Today's Date: 11/03/2022   PCP: Ardyth Man, PA- C REFERRING PROVIDER: Morene Crocker, MD  END OF SESSION:  PT End of Session - 11/03/22 1117     Visit Number 22    Number of Visits 46    Date for PT Re-Evaluation 01/26/23    Authorization Type [employer]    Progress Note Due on Visit 30    PT Start Time 1103    PT Stop Time 1144    PT Time Calculation (min) 41 min    Equipment Utilized During Treatment Gait belt    Activity Tolerance Patient tolerated treatment well;No increased pain    Behavior During Therapy [facility] for tasks assessed/performed                     Past Medical History:  Diagnosis Date   Anxiety    Arteriosclerosis of coronary artery 11/16/2011   Overview:  Stent 10/2011 stent rca 2015 with collaterals to lad which is chronically occluded    Benign enlargement of prostate    Benign essential HTN 06/11/2014   Benign prostatic hypertrophy without urinary obstruction 07/31/2014   Bilateral cataracts 05/16/2013   Overview:  [facility]     Bone spur of foot    Left   BP (high blood pressure) 11/09/2012   Cancer (HCC)    skin (forehead) and bladder   Carotid artery narrowing 02/08/2014   Depression    Detrusor hypertrophy    Diabetes (HCC)    Diabetes mellitus, type 2 (HCC) 12/12/2012   Diverticulosis    Dyspnea    Esophageal reflux    Esophageal reflux    Fothergill's neuralgia 08/07/2012   Overview:  [facility]    Gastritis    GERD (gastroesophageal reflux disease)    Headache    cluster headaches   Healed myocardial infarct 11/09/2012   Hearing loss in left ear    Heart disease    Hematuria    Hemorrhoids    History of hiatal hernia 12/14/2017   small    Hypercholesteremia    Lesion of bladder    Myocardial infarct (HCC)    Presence of stent in coronary artery 11/09/2012   Rectal  bleeding    Trigeminal neuralgia    Trigeminal neuralgia    Valvular heart disease    Vitamin D deficiency    Past Surgical History:  Procedure Laterality Date   APPENDECTOMY     BOTOX INJECTION N/A 12/21/2017   Procedure: Bladder BOTOX INJECTION;  Surgeon: Vanna Scotland, MD;  Location: [facility] ORS;  Service: Urology;  Laterality: N/A;   BOTOX INJECTION N/A 09/11/2018   Procedure: Bladder BOTOX INJECTION;  Surgeon: Vanna Scotland, MD;  Location: [facility] ORS;  Service: Urology;  Laterality: N/A;   CARDIAC CATHETERIZATION     CARDIAC CATHETERIZATION N/A 01/22/2015   Procedure: Left Heart Cath;  Surgeon: Lamar Blinks, MD;  Location: [facility] INVASIVE CV LAB;  Service: Cardiovascular;  Laterality: N/A;   CARDIAC CATHETERIZATION N/A 01/22/2015   Procedure: Coronary Stent Intervention;  Surgeon: Marcina Millard, MD;  Location: [facility] INVASIVE CV LAB;  Service: Cardiovascular;  Laterality: N/A;   CATARACT EXTRACTION, BILATERAL     COLONOSCOPY WITH PROPOFOL N/A 12/08/2015   Procedure: COLONOSCOPY WITH PROPOFOL;  Surgeon: Christena Deem, MD;  Location: [facility] ENDOSCOPY;  Service: Endoscopy;  Laterality: N/A;   COLONOSCOPY WITH PROPOFOL N/A  12/09/2015   Procedure: COLONOSCOPY WITH PROPOFOL;  Surgeon: Christena Deem, MD;  Location: [facility] ENDOSCOPY;  Service: Endoscopy;  Laterality: N/A;   CORONARY ANGIOPLASTY     5 stents   CORONARY STENT PLACEMENT  2015   x5   CYSTOSCOPY N/A 09/11/2018   Procedure: CYSTOSCOPY;  Surgeon: Vanna Scotland, MD;  Location: [facility] ORS;  Service: Urology;  Laterality: N/A;   CYSTOSCOPY WITH BIOPSY N/A 12/21/2017   Procedure: CYSTOSCOPY WITH BIOPSY;  Surgeon: Vanna Scotland, MD;  Location: [facility] ORS;  Service: Urology;  Laterality: N/A;   ESOPHAGOGASTRODUODENOSCOPY (EGD) WITH PROPOFOL N/A 12/08/2015   Procedure: ESOPHAGOGASTRODUODENOSCOPY (EGD) WITH PROPOFOL;  Surgeon: Christena Deem, MD;  Location: [facility] ENDOSCOPY;  Service: Endoscopy;  Laterality: N/A;    ESOPHAGOGASTRODUODENOSCOPY (EGD) WITH PROPOFOL N/A 12/13/2017   Procedure: ESOPHAGOGASTRODUODENOSCOPY (EGD) WITH PROPOFOL;  Surgeon: Christena Deem, MD;  Location: [facility] ENDOSCOPY;  Service: Endoscopy;  Laterality: N/A;   EYE SURGERY     HERNIA REPAIR     kidney tumor remove     TRANSURETHRAL RESECTION OF BLADDER TUMOR WITH GYRUS (TURBT-GYRUS)  12/2013   UMBILICAL HERNIA REPAIR     urethral meatotomy     Patient Active Problem List   Diagnosis Date Noted   Class 2 obesity due to excess calories with body mass index (BMI) of 36.0 to 36.9 in adult 04/11/2020   Swelling of limb 03/28/2020   Lymphedema 03/28/2020   Trigeminal neuralgia 12/25/2019   Lower limb ulcer, calf, left, limited to breakdown of skin (HCC) 12/25/2019   OSA (obstructive sleep apnea) 01/23/2019   Acute systolic CHF (congestive heart failure) (HCC) 12/12/2018   Bruising 07/10/2018   Diet-controlled type 2 diabetes mellitus (HCC) 03/24/2018   Microalbuminuria 03/24/2018   Dizziness 11/17/2017   Simple chronic bronchitis (HCC) 09/22/2017   SOBOE (shortness of breath on exertion) 02/18/2017   Chronic midline low back pain without sciatica 12/29/2016   Periodic limb movement disorder 12/29/2016   Benign essential tremor 06/22/2016   Pure hypercholesterolemia 06/20/2015   Erectile dysfunction due to arterial insufficiency 06/16/2015   Unstable angina (HCC) 01/22/2015   Abdominal aortic 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 rupture (HCC) 12/31/2014   Benign prostatic hypertrophy without urinary obstruction 07/31/2014   Enlarged prostate 07/31/2014   Benign essential HTN 06/11/2014   Carotid artery narrowing 02/08/2014   Carotid artery obstruction 02/08/2014   Bilateral carotid artery stenosis 02/08/2014   Chest pain 08/20/2013   Bilateral cataracts 05/16/2013   Cataract 05/16/2013   Clinical depression 12/12/2012   Diabetes mellitus, type 2 (HCC) 12/12/2012   Combined fat and carbohydrate induced hyperlipemia 12/12/2012    Major depressive disorder, single episode, unspecified 12/12/2012   Acid reflux 11/09/2012   Presence of stent in coronary artery 11/09/2012   BP (high blood pressure) 11/09/2012   Healed myocardial infarct 11/09/2012   Gastro-esophageal reflux disease without esophagitis 11/09/2012   History of cardiovascular surgery 11/09/2012   Fothergill's neuralgia 08/07/2012   Swelling of testicle 04/06/2012   Disorder of male genital organ 04/06/2012   Swelling of the testicles 04/06/2012   Arteriosclerosis of coronary artery 11/16/2011   CAD in native artery 11/16/2011   Fatigue 06/04/2011   Avitaminosis D 11/30/2010    ONSET DATE: 03/17/2022- Worsening symptoms  REFERRING DIAG:  G20.A1 (ICD-10-CM) - Parkinsons  R26.2 (ICD-10-CM) - Difficulty walking    THERAPY DIAG:  Difficulty in walking, not elsewhere classified  Muscle weakness (generalized)  Abnormality of gait and mobility  Unsteadiness on feet  Other abnormalities of gait and  mobility  Other lack of coordination  Rationale for Evaluation and Treatment: Rehabilitation  SUBJECTIVE:                                                                                                                                                                                             SUBJECTIVE STATEMENT:  Patient reports doing okay this week. States compliant with stretching over past few weeks.        Patient reports 0/10  Pt accompanied by: significant other  PERTINENT HISTORY: Patient is a 77 year old male with referral to outpatient PT for Parkinsons and difficulty walking. He was recently seen by Neurology worsening symptoms of parkinson- difficulty with walking. He has past medical history of Arthritis, Bladder cancer, and Trigeminal Neuralgia.  PAIN:  Are you having pain? NO  PRECAUTIONS: Fall  WEIGHT BEARING RESTRICTIONS: No  FALLS: Has patient fallen in last 6 months? Yes. Number of falls 1  LIVING ENVIRONMENT: Lives  with: lives with their spouse Lives in: House/apartment Stairs: Yes: External: 2 steps; has grab bar Has following equipment at home: Dan Humphreys - 2 wheeled, Environmental consultant - 4 wheeled, and Grab bars  PLOF: Independent with household mobility with device, Independent with community mobility with device, Independent with transfers, and Requires assistive device for independence  PATIENT GOALS: Get to where I can walk better again and get my legs stronger.   OBJECTIVE:    TODAY'S TREATMENT:                                                                                                                              DATE: 11/03/22    Physical therapy treatment session today consisted of completing assessment of goals and administration of testing as demonstrated and documented in flow sheet, treatment, and goals section of this note. Addition treatments may be found below.   NMR:  Step tap onto 1st step x15 reps Static stand (1 LE on airex pad and 1LE on 1st step) with horizontal and vertical head turns x10 reps each.  Tandem standing - attempting to hold as  long as possible - 20 sec.    Pt required occasional rest breaks due fatigue, PT was quick to ask when pt appeared to be fatiguing in order to prevent excessive fatigue.        PATIENT EDUCATION: Education details: Plan of care; purpose of functional outcome measures.  Person educated: Patient Education method: Explanation, Demonstration, Tactile cues, Verbal cues, and Handouts Education comprehension: verbalized understanding, returned demonstration, verbal cues required, tactile cues required, and needs further education  HOME EXERCISE PROGRAM: Access Code: [email or web address] URL: [email or web address] Date: 08/11/2022 Prepared by: Maureen Ralphs  Exercises - Seated Lumbar Flexion Stretch  - 7 x weekly - 3 sets - 30 sec hold - Seated Thoracic Flexion and Rotation with Arms Crossed  - 1 x daily - 7 x weekly - 3 sets - 30 sec  hold - Seated Figure 4 Piriformis Stretch  - 1 x daily - 7 x weekly - 3 sets - 10 reps  GOALS: Goals reviewed with patient? Yes  SHORT TERM GOALS: Target date: 09/20/2022  Pt will be independent with a comprehensive HEP in order to improve strength and balance in order to decrease fall risk and improve function at home and work.  Baseline: EVAL: Patient admits not performing much exercises - not moving around well. 10/25/2022-Patient reports not as compliant with previous HEP as instructed- Verbally reviewed Low back, cervical, thoracic stretching and importance of daily walking. 11/03/2022= Patient reports independent with cervical Stretches and some walking at home Goal status: MET   LONG TERM GOALS: Target date: 01/26/2023  Pt will improve FOTO to target score of 45 % to display perceived improvements in ability to complete ADL's.  Baseline: EVAL: 42: 11/03/2022= 55 Goal status: MET  2.  Pt will decrease 5TSTS by at least 5 seconds in order to demonstrate clinically significant improvement in LE strength.  Baseline: EVAL = 17.39 sec without UE support; 10/25/2022= 12.91 sec Goal status: PROGRESSING  3. Pt will increase by at least 0.15 m/s in order to demonstrate clinically significant improvement in community ambulation   Baseline: EVAL= 0.67 m/s; 10/25/2022= 0.77 m/s Goal status: PROGRESSING  4.  Pt will improve BERG by at least 3 points in order to demonstrate clinically significant improvement in balance.   Baseline: EVAL: to be assessed next visit: 08/23/2022= 36/56; 11/03/2022= 40/56 Goal status: MET  5.  Pt will decrease TUG to below 14 seconds/decrease in order to demonstrate decreased fall risk. Baseline: EVAL: 22.00 sec with upright 4WW; 10/25/2022= 15.98 sec with 4WW Goal status: PROGRESSING  6.  Pt will increase by at least 69m (164ft) in order to demonstrate clinically significant improvement in cardiopulmonary endurance and community ambulation  Baseline: EVAL:  3:45 min (450 feet with upright 4WW) 10/25/2022= 830 feet with 4WW Goal status: MET  7.   Patient will report returning to walking in to community places using most appropriate assistive device vs current use of scooter for improved abilities with mobility during community outings Baseline: EVAL: Current using scooter for many social/community outings; 10/25/2022- Paitent continues to scooter for long distance community distances- due to some fatigue or SOB.  Goal status: ONGOING  8.  Pt will increase > 1000 feet in order to demonstrate clinically significant improvement in cardiopulmonary endurance and community ambulation   Baseline: 10/25/2022= 830 feet with 4WW  Goal status= NEW  9.  Pt will improve BERG by at least 3 points in order to demonstrate clinically significant improvement in balance.   Baseline: 11/03/2022= 40/56  Goal status: NEW  ASSESSMENT:  CLINICAL IMPRESSION:  Patient presents with good motivation overall today.  Reassess his balance goal to complete retesting for recert visit. He demo continued progress overall- scoring 4 points higher on his balance goal. He made progress during this cert and appropriate to continue with skilled PT services. Patient's condition has the potential to improve in response to therapy. Maximum improvement is yet to be obtained. The anticipated improvement is attainable and reasonable in a generally predictable time.  Pt will continue to benefit from skilled PT services to address deficits and impairment identified in evaluation in order to maximize independence and safety in basic mobility required for performance of ADL, IADL, and leisure.    OBJECTIVE IMPAIRMENTS: Abnormal gait, cardiopulmonary status limiting activity, decreased activity tolerance, decreased balance, decreased coordination, decreased endurance, decreased mobility, difficulty walking, decreased ROM, decreased strength, hypomobility, impaired flexibility, impaired sensation,  impaired UE functional use, postural dysfunction, obesity, and pain.   ACTIVITY LIMITATIONS: carrying, lifting, bending, standing, squatting, stairs, transfers, continence, and reach over head  PARTICIPATION LIMITATIONS: meal prep, cleaning, laundry, driving, shopping, community activity, and yard work  PERSONAL FACTORS: Age and 3+ comorbidities: HTN, Arthritis, Low back pain, Trigeminal Neuralgia  are also affecting patient's functional outcome.   REHAB POTENTIAL: Good  CLINICAL DECISION MAKING: Evolving/moderate complexity  EVALUATION COMPLEXITY: Moderate  PLAN:  PT FREQUENCY: 1-2x/week  PT DURATION: 12 weeks  PLANNED INTERVENTIONS: Therapeutic exercises, Therapeutic activity, Neuromuscular re-education, Balance training, Gait training, Patient/Family education, Self Care, Joint mobilization, Stair training, Vestibular training, Canalith repositioning, DME instructions, Dry Needling, Electrical stimulation, Spinal manipulation, Spinal mobilization, Cryotherapy, Moist heat, Manual therapy, and Re-evaluation  PLAN FOR NEXT SESSION: Progress therex, bed mobility, and balance training. Initiate training at [facility] for UE/LE strengthening and cardiovascular training.    Lenda Kelp, PT 11/03/2022, 1:10 PM  1:10 PM, 11/03/22  Physical Therapist - [facility]  Outpatient Physical Therapy- Main Campus [phone]

## 2022-11-05 ENCOUNTER — Ambulatory Visit: Payer: Medicare HMO

## 2022-11-05 DIAGNOSIS — M545 Low back pain, unspecified: Secondary | ICD-10-CM | POA: Diagnosis not present

## 2022-11-05 DIAGNOSIS — M6281 Muscle weakness (generalized): Secondary | ICD-10-CM

## 2022-11-05 DIAGNOSIS — R278 Other lack of coordination: Secondary | ICD-10-CM | POA: Diagnosis not present

## 2022-11-05 DIAGNOSIS — R262 Difficulty in walking, not elsewhere classified: Secondary | ICD-10-CM

## 2022-11-05 DIAGNOSIS — R2689 Other abnormalities of gait and mobility: Secondary | ICD-10-CM | POA: Diagnosis not present

## 2022-11-05 DIAGNOSIS — R269 Unspecified abnormalities of gait and mobility: Secondary | ICD-10-CM | POA: Diagnosis not present

## 2022-11-05 DIAGNOSIS — G8929 Other chronic pain: Secondary | ICD-10-CM | POA: Diagnosis not present

## 2022-11-05 DIAGNOSIS — R2681 Unsteadiness on feet: Secondary | ICD-10-CM | POA: Diagnosis not present

## 2022-11-05 NOTE — Therapy (Signed)
OUTPATIENT PHYSICAL THERAPY NEURO TREATMENT  Patient Name: [name] MRN: [id number] DOB:1946/01/06, 77 y.o., male Today's Date: 11/05/2022   PCP: Ardyth Man, PA- C REFERRING PROVIDER: Morene Crocker, MD  END OF SESSION:  PT End of Session - 11/05/22 1023     Visit Number 23    Number of Visits 46    Date for PT Re-Evaluation 01/26/23    Authorization Type [employer]    Progress Note Due on Visit 30    PT Start Time 1012    PT Stop Time 1058    PT Time Calculation (min) 46 min    Equipment Utilized During Treatment Gait belt    Activity Tolerance Patient tolerated treatment well;No increased pain    Behavior During Therapy [facility] for tasks assessed/performed                      Past Medical History:  Diagnosis Date   Anxiety    Arteriosclerosis of coronary artery 11/16/2011   Overview:  Stent 10/2011 stent rca 2015 with collaterals to lad which is chronically occluded    Benign enlargement of prostate    Benign essential HTN 06/11/2014   Benign prostatic hypertrophy without urinary obstruction 07/31/2014   Bilateral cataracts 05/16/2013   Overview:  [facility]     Bone spur of foot    Left   BP (high blood pressure) 11/09/2012   Cancer (HCC)    skin (forehead) and bladder   Carotid artery narrowing 02/08/2014   Depression    Detrusor hypertrophy    Diabetes (HCC)    Diabetes mellitus, type 2 (HCC) 12/12/2012   Diverticulosis    Dyspnea    Esophageal reflux    Esophageal reflux    Fothergill's neuralgia 08/07/2012   Overview:  [facility]    Gastritis    GERD (gastroesophageal reflux disease)    Headache    cluster headaches   Healed myocardial infarct 11/09/2012   Hearing loss in left ear    Heart disease    Hematuria    Hemorrhoids    History of hiatal hernia 12/14/2017   small    Hypercholesteremia    Lesion of bladder    Myocardial infarct (HCC)    Presence of stent in coronary artery 11/09/2012   Rectal bleeding     Trigeminal neuralgia    Trigeminal neuralgia    Valvular heart disease    Vitamin D deficiency    Past Surgical History:  Procedure Laterality Date   APPENDECTOMY     BOTOX INJECTION N/A 12/21/2017   Procedure: Bladder BOTOX INJECTION;  Surgeon: Vanna Scotland, MD;  Location: [facility] ORS;  Service: Urology;  Laterality: N/A;   BOTOX INJECTION N/A 09/11/2018   Procedure: Bladder BOTOX INJECTION;  Surgeon: Vanna Scotland, MD;  Location: [facility] ORS;  Service: Urology;  Laterality: N/A;   CARDIAC CATHETERIZATION     CARDIAC CATHETERIZATION N/A 01/22/2015   Procedure: Left Heart Cath;  Surgeon: Lamar Blinks, MD;  Location: [facility] INVASIVE CV LAB;  Service: Cardiovascular;  Laterality: N/A;   CARDIAC CATHETERIZATION N/A 01/22/2015   Procedure: Coronary Stent Intervention;  Surgeon: Marcina Millard, MD;  Location: [facility] INVASIVE CV LAB;  Service: Cardiovascular;  Laterality: N/A;   CATARACT EXTRACTION, BILATERAL     COLONOSCOPY WITH PROPOFOL N/A 12/08/2015   Procedure: COLONOSCOPY WITH PROPOFOL;  Surgeon: Christena Deem, MD;  Location: [facility] ENDOSCOPY;  Service: Endoscopy;  Laterality: N/A;   COLONOSCOPY WITH PROPOFOL N/A  12/09/2015   Procedure: COLONOSCOPY WITH PROPOFOL;  Surgeon: Christena Deem, MD;  Location: [facility]OSCOPY;  Service: Endoscopy;  Laterality: N/A;   CORONARY ANGIOPLASTY     5 stents   CORONARY STENT PLACEMENT  2015   x5   CYSTOSCOPY N/A 09/11/2018   Procedure: CYSTOSCOPY;  Surgeon: Vanna Scotland, MD;  Location: [facility] ORS;  Service: Urology;  Laterality: N/A;   CYSTOSCOPY WITH BIOPSY N/A 12/21/2017   Procedure: CYSTOSCOPY WITH BIOPSY;  Surgeon: Vanna Scotland, MD;  Location: [facility] ORS;  Service: Urology;  Laterality: N/A;   ESOPHAGOGASTRODUODENOSCOPY (EGD) WITH PROPOFOL N/A 12/08/2015   Procedure: ESOPHAGOGASTRODUODENOSCOPY (EGD) WITH PROPOFOL;  Surgeon: Christena Deem, MD;  Location: [facility] END[facility]COPY;  Service: Endoscopy;  Laterality: N/A;    ESOPHAGOGASTRODUODENOSCOPY (EGD) WITH PROPOFOL N/A 12/13/2017   Procedure: ESOPHAGOGASTRODUODENOSCOPY (EGD) WITH PROPOFOL;  Surgeon: Christena Deem, MD;  Location: [facility] ENDOSCOPY;  Service: Endoscopy;  Laterality: N/A;   EYE SURGERY     HERNIA REPAIR     kidney tumor remove     TRANSURETHRAL RESECTION OF BLADDER TUMOR WITH GYRUS (TURBT-GYRUS)  12/2013   UMBILICAL HERNIA REPAIR     urethral meatotomy     Patient Active Problem List   Diagnosis Date Noted   Class 2 obesity due to excess calories with body mass index (BMI) of 36.0 to 36.9 in adult 04/11/2020   Swelling of limb 03/28/2020   Lymphedema 03/28/2020   Trigeminal neuralgia 12/25/2019   Lower limb ulcer, calf, left, limited to breakdown of skin (HCC) 12/25/2019   OSA (obstructive sleep apnea) 01/23/2019   Acute systolic CHF (congestive heart failure) (HCC) 12/12/2018   Bruising 07/10/2018   Diet-controlled type 2 diabetes mellitus (HCC) 03/24/2018   Microalbuminuria 03/24/2018   Dizziness 11/17/2017   Simple chronic bronchitis (HCC) 09/22/2017   SOBOE (shortness of breath on exertion) 02/18/2017   Chronic midline low back pain without sciatica 12/29/2016   Periodic limb movement disorder 12/29/2016   Benign essential tremor 06/22/2016   Pure hypercholesterolemia 06/20/2015   Erectile dysfunction due to arterial insufficiency 06/16/2015   Unstable angina (HCC) 01/22/2015   Abdominal aortic 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 rupture (HCC) 12/31/2014   Benign prostatic hypertrophy without urinary obstruction 07/31/2014   Enlarged prostate 07/31/2014   Benign essential HTN 06/11/2014   Carotid artery narrowing 02/08/2014   Carotid artery obstruction 02/08/2014   Bilateral carotid artery stenosis 02/08/2014   Chest pain 08/20/2013   Bilateral cataracts 05/16/2013   Cataract 05/16/2013   Clinical depression 12/12/2012   Diabetes mellitus, type 2 (HCC) 12/12/2012   Combined fat and carbohydrate induced hyperlipemia 12/12/2012    Major depressive disorder, single episode, unspecified 12/12/2012   Acid reflux 11/09/2012   Presence of stent in coronary artery 11/09/2012   BP (high blood pressure) 11/09/2012   Healed myocardial infarct 11/09/2012   Gastro-esophageal reflux disease without esophagitis 11/09/2012   History of cardiovascular surgery 11/09/2012   Fothergill's neuralgia 08/07/2012   Swelling of testicle 04/06/2012   Disorder of male genital organ 04/06/2012   Swelling of the testicles 04/06/2012   Arteriosclerosis of coronary artery 11/16/2011   CAD in native artery 11/16/2011   Fatigue 06/04/2011   Avitaminosis D 11/30/2010    ONSET DATE: 03/17/2022- Worsening symptoms  REFERRING DIAG:  G20.A1 (ICD-10-CM) - Parkinsons  R26.2 (ICD-10-CM) - Difficulty walking    THERAPY DIAG:  Difficulty in walking, not elsewhere classified  Muscle weakness (generalized)  Abnormality of gait and mobility  Unsteadiness on feet  Other abnormalities of gait and  mobility  Other lack of coordination  Chronic bilateral low back pain without sciatica  Rationale for Evaluation and Treatment: Rehabilitation  SUBJECTIVE:                                                                                                                                                                                             SUBJECTIVE STATEMENT:  Patient reports feeling okay with no new issues. States seems to be walking better- no stumbles or falls.      Patient reports 0/10  Pt accompanied by: significant other  PERTINENT HISTORY: Patient is a 77 year old male with referral to outpatient PT for Parkinsons and difficulty walking. He was recently seen by Neurology worsening symptoms of parkinson- difficulty with walking. He has past medical history of Arthritis, Bladder cancer, and Trigeminal Neuralgia.  PAIN:  Are you having pain? NO  PRECAUTIONS: Fall  WEIGHT BEARING RESTRICTIONS: No  FALLS: Has patient fallen in last  6 months? Yes. Number of falls 1  LIVING ENVIRONMENT: Lives with: lives with their spouse Lives in: House/apartment Stairs: Yes: External: 2 steps; has grab bar Has following equipment at home: Dan Humphreys - 2 wheeled, Environmental consultant - 4 wheeled, and Grab bars  PLOF: Independent with household mobility with device, Independent with community mobility with device, Independent with transfers, and Requires assistive device for independence  PATIENT GOALS: Get to where I can walk better again and get my legs stronger.   OBJECTIVE:    TODAY'S TREATMENT:                                                                                                                              DATE: 11/05/22     NMR:  Walking in // bars - Step up/over 1/2 foam roll (4)  x 5  Walking in // bars - step up/over 1/2 foam roll (4) with 3# AW x 5 Step tap onto 1st step x15 reps Static stand  LE on airex pad with eyes open and then closed x 30 sec x 3 Tandem gait on airex beam x  length of beam and back x 8 Side stepping along balance beam - without UE - slow and steady -few LOB  with self recovery. Static stand  LE on airex pad with horizontal and later Vertical head turns x 10 Therex:   Sit to stand with overhead Y raise x 15 reps Resistive gait 3# AW x 300 feet with 4WW  Pt required occasional rest breaks due fatigue, PT was quick to ask when pt appeared to be fatiguing in order to prevent excessive fatigue.        PATIENT EDUCATION: Education details: Plan of care; purpose of functional outcome measures.  Person educated: Patient Education method: Explanation, Demonstration, Tactile cues, Verbal cues, and Handouts Education comprehension: verbalized understanding, returned demonstration, verbal cues required, tactile cues required, and needs further education  HOME EXERCISE PROGRAM: Access Code: [email or web address] URL: [email or web address] Date: 08/11/2022 Prepared by: Maureen Ralphs  Exercises - Seated Lumbar Flexion Stretch  - 7 x weekly - 3 sets - 30 sec hold - Seated Thoracic Flexion and Rotation with Arms Crossed  - 1 x daily - 7 x weekly - 3 sets - 30 sec hold - Seated Figure 4 Piriformis Stretch  - 1 x daily - 7 x weekly - 3 sets - 10 reps  GOALS: Goals reviewed with patient? Yes  SHORT TERM GOALS: Target date: 09/20/2022  Pt will be independent with a comprehensive HEP in order to improve strength and balance in order to decrease fall risk and improve function at home and work.  Baseline: EVAL: Patient admits not performing much exercises - not moving around well. 10/25/2022-Patient reports not as compliant with previous HEP as instructed- Verbally reviewed Low back, cervical, thoracic stretching and importance of daily walking. 11/03/2022= Patient reports independent with cervical Stretches and some walking at home Goal status: MET   LONG TERM GOALS: Target date: 01/26/2023  Pt will improve FOTO to target score of 45 % to display perceived improvements in ability to complete ADL's.  Baseline: EVAL: 42: 11/03/2022= 55 Goal status: MET  2.  Pt will decrease 5TSTS by at least 5 seconds in order to demonstrate clinically significant improvement in LE strength.  Baseline: EVAL = 17.39 sec without UE support; 10/25/2022= 12.91 sec Goal status: PROGRESSING  3. Pt will increase by at least 0.15 m/s in order to demonstrate clinically significant improvement in community ambulation   Baseline: EVAL= 0.67 m/s; 10/25/2022= 0.77 m/s Goal status: PROGRESSING  4.  Pt will improve BERG by at least 3 points in order to demonstrate clinically significant improvement in balance.   Baseline: EVAL: to be assessed next visit: 08/23/2022= 36/56; 11/03/2022= 40/56 Goal status: MET  5.  Pt will decrease TUG to below 14 seconds/decrease in order to demonstrate decreased fall risk. Baseline: EVAL: 22.00 sec with upright 4WW; 10/25/2022= 15.98 sec with 4WW Goal status:  PROGRESSING  6.  Pt will increase by at least 73m (150ft) in order to demonstrate clinically significant improvement in cardiopulmonary endurance and community ambulation  Baseline: EVAL: 3:45 min (450 feet with upright 4WW) 10/25/2022= 830 feet with 4WW Goal status: MET  7.   Patient will report returning to walking in to community places using most appropriate assistive device vs current use of scooter for improved abilities with mobility during community outings Baseline: EVAL: Current using scooter for many social/community outings; 10/25/2022- Paitent continues to scooter for long distance community distances- due to some fatigue or SOB.  Goal status: ONGOING  8.  Pt will increase  > 1000 feet in order to demonstrate clinically significant improvement in cardiopulmonary endurance and community ambulation   Baseline: 10/25/2022= 830 feet with 4WW  Goal status= NEW  9.  Pt will improve BERG by at least 3 points in order to demonstrate clinically significant improvement in balance.   Baseline: 11/03/2022= 40/56 Goal status: NEW  ASSESSMENT:  CLINICAL IMPRESSION: Treatment continues to focus on balance and LE strengthening. Patient performed several dynamic and static balance with improving performance with practice and less overall hand support. He is also performing sit to stand without UE support well without any observed fatigue today. Pt will continue to benefit from skilled PT services to address deficits and impairment identified in evaluation in order to maximize independence and safety in basic mobility required for performance of ADL, IADL, and leisure.    OBJECTIVE IMPAIRMENTS: Abnormal gait, cardiopulmonary status limiting activity, decreased activity tolerance, decreased balance, decreased coordination, decreased endurance, decreased mobility, difficulty walking, decreased ROM, decreased strength, hypomobility, impaired flexibility, impaired sensation, impaired UE functional  use, postural dysfunction, obesity, and pain.   ACTIVITY LIMITATIONS: carrying, lifting, bending, standing, squatting, stairs, transfers, continence, and reach over head  PARTICIPATION LIMITATIONS: meal prep, cleaning, laundry, driving, shopping, community activity, and yard work  PERSONAL FACTORS: Age and 3+ comorbidities: HTN, Arthritis, Low back pain, Trigeminal Neuralgia  are also affecting patient's functional outcome.   REHAB POTENTIAL: Good  CLINICAL DECISION MAKING: Evolving/moderate complexity  EVALUATION COMPLEXITY: Moderate  PLAN:  PT FREQUENCY: 1-2x/week  PT DURATION: 12 weeks  PLANNED INTERVENTIONS: Therapeutic exercises, Therapeutic activity, Neuromuscular re-education, Balance training, Gait training, Patient/Family education, Self Care, Joint mobilization, Stair training, Vestibular training, Canalith repositioning, DME instructions, Dry Needling, Electrical stimulation, Spinal manipulation, Spinal mobilization, Cryotherapy, Moist heat, Manual therapy, and Re-evaluation  PLAN FOR NEXT SESSION: Progress therex, bed mobility, and balance training. Initiate training at [facility] for UE/LE strengthening and cardiovascular training.    Lenda Kelp, PT 11/05/2022, 11:21 AM  11:21 AM, 11/05/22  Physical Therapist - Outpatient Services [facility]  Outpatient Physical Therapy- Main Campus [phone]

## 2022-11-08 ENCOUNTER — Ambulatory Visit: Payer: Medicare HMO

## 2022-11-08 DIAGNOSIS — R269 Unspecified abnormalities of gait and mobility: Secondary | ICD-10-CM

## 2022-11-08 DIAGNOSIS — R278 Other lack of coordination: Secondary | ICD-10-CM

## 2022-11-08 DIAGNOSIS — R2681 Unsteadiness on feet: Secondary | ICD-10-CM | POA: Diagnosis not present

## 2022-11-08 DIAGNOSIS — R262 Difficulty in walking, not elsewhere classified: Secondary | ICD-10-CM

## 2022-11-08 DIAGNOSIS — G8929 Other chronic pain: Secondary | ICD-10-CM | POA: Diagnosis not present

## 2022-11-08 DIAGNOSIS — M6281 Muscle weakness (generalized): Secondary | ICD-10-CM | POA: Diagnosis not present

## 2022-11-08 DIAGNOSIS — R2689 Other abnormalities of gait and mobility: Secondary | ICD-10-CM

## 2022-11-08 DIAGNOSIS — M545 Low back pain, unspecified: Secondary | ICD-10-CM | POA: Diagnosis not present

## 2022-11-08 NOTE — Therapy (Signed)
OUTPATIENT PHYSICAL THERAPY NEURO TREATMENT  Patient Name: [name] MRN: [id number] DOB:03-07-1945, 77 y.o., male Today's Date: 11/08/2022   PCP: Ardyth Man, PA- C REFERRING PROVIDER: Morene Crocker, MD  END OF SESSION:  PT End of Session - 11/08/22 1109     Visit Number 24    Number of Visits 46    Date for PT Re-Evaluation 01/26/23    Authorization Type [employer]    Progress Note Due on Visit 30    PT Start Time 1102    Equipment Utilized During Treatment Gait belt    Activity Tolerance Patient tolerated treatment well;No increased pain    Behavior During Therapy [facility] for tasks assessed/performed                      Past Medical History:  Diagnosis Date   Anxiety    Arteriosclerosis of coronary artery 11/16/2011   Overview:  Stent 10/2011 stent rca 2015 with collaterals to lad which is chronically occluded    Benign enlargement of prostate    Benign essential HTN 06/11/2014   Benign prostatic hypertrophy without urinary obstruction 07/31/2014   Bilateral cataracts 05/16/2013   Overview:  [facility]     Bone spur of foot    Left   BP (high blood pressure) 11/09/2012   Cancer (HCC)    skin (forehead) and bladder   Carotid artery narrowing 02/08/2014   Depression    Detrusor hypertrophy    Diabetes (HCC)    Diabetes mellitus, type 2 (HCC) 12/12/2012   Diverticulosis    Dyspnea    Esophageal reflux    Esophageal reflux    Fothergill's neuralgia 08/07/2012   Overview:  [facility]    Gastritis    GERD (gastroesophageal reflux disease)    Headache    cluster headaches   Healed myocardial infarct 11/09/2012   Hearing loss in left ear    Heart disease    Hematuria    Hemorrhoids    History of hiatal hernia 12/14/2017   small    Hypercholesteremia    Lesion of bladder    Myocardial infarct (HCC)    Presence of stent in coronary artery 11/09/2012   Rectal bleeding    Trigeminal neuralgia    Trigeminal neuralgia     Valvular heart disease    Vitamin D deficiency    Past Surgical History:  Procedure Laterality Date   APPENDECTOMY     BOTOX INJECTION N/A 12/21/2017   Procedure: Bladder BOTOX INJECTION;  Surgeon: Vanna Scotland, MD;  Location: [facility] ORS;  Service: Urology;  Laterality: N/A;   BOTOX INJECTION N/A 09/11/2018   Procedure: Bladder BOTOX INJECTION;  Surgeon: Vanna Scotland, MD;  Location: [facility] ORS;  Service: Urology;  Laterality: N/A;   CARDIAC CATHETERIZATION     CARDIAC CATHETERIZATION N/A 01/22/2015   Procedure: Left Heart Cath;  Surgeon: Lamar Blinks, MD;  Location: [facility] INVASIVE CV LAB;  Service: Cardiovascular;  Laterality: N/A;   CARDIAC CATHETERIZATION N/A 01/22/2015   Procedure: Coronary Stent Intervention;  Surgeon: Marcina Millard, MD;  Location: [facility] INVASIVE CV LAB;  Service: Cardiovascular;  Laterality: N/A;   CATARACT EXTRACTION, BILATERAL     COLONOSCOPY WITH PROPOFOL N/A 12/08/2015   Procedure: COLONOSCOPY WITH PROPOFOL;  Surgeon: Christena Deem, MD;  Location: [facility] ENDOSCOPY;  Service: Endoscopy;  Laterality: N/A;   COLONOSCOPY WITH PROPOFOL N/A 12/09/2015   Procedure: COLONOSCOPY WITH PROPOFOL;  Surgeon: Christena Deem, MD;  Location: [facility]  ENDOSCOPY;  Service: Endoscopy;  Laterality: N/A;   CORONARY ANGIOPLASTY     5 stents   CORONARY STENT PLACEMENT  2015   x5   CYSTOSCOPY N/A 09/11/2018   Procedure: CYSTOSCOPY;  Surgeon: Vanna Scotland, MD;  Location: [facility] ORS;  Service: Urology;  Laterality: N/A;   CYSTOSCOPY WITH BIOPSY N/A 12/21/2017   Procedure: CYSTOSCOPY WITH BIOPSY;  Surgeon: Vanna Scotland, MD;  Location: [facility] ORS;  Service: Urology;  Laterality: N/A;   ESOPHAGOGASTRODUODENOSCOPY (EGD) WITH PROPOFOL N/A 12/08/2015   Procedure: ESOPHAGOGASTRODUODENOSCOPY (EGD) WITH PROPOFOL;  Surgeon: Christena Deem, MD;  Location: [facility] ENDOSCOPY;  Service: Endoscopy;  Laterality: N/A;   ESOPHAGOGASTRODUODENOSCOPY (EGD) WITH PROPOFOL N/A 12/13/2017   Procedure:  ESOPHAGOGASTRODUODENOSCOPY (EGD) WITH PROPOFOL;  Surgeon: Christena Deem, MD;  Location: [facility] ENDOSCOPY;  Service: Endoscopy;  Laterality: N/A;   EYE SURGERY     HERNIA REPAIR     kidney tumor remove     TRANSURETHRAL RESECTION OF BLADDER TUMOR WITH GYRUS (TURBT-GYRUS)  12/2013   UMBILICAL HERNIA REPAIR     urethral meatotomy     Patient Active Problem List   Diagnosis Date Noted   Class 2 obesity due to excess calories with body mass index (BMI) of 36.0 to 36.9 in adult 04/11/2020   Swelling of limb 03/28/2020   Lymphedema 03/28/2020   Trigeminal neuralgia 12/25/2019   Lower limb ulcer, calf, left, limited to breakdown of skin (HCC) 12/25/2019   OSA (obstructive sleep apnea) 01/23/2019   Acute systolic CHF (congestive heart failure) (HCC) 12/12/2018   Bruising 07/10/2018   Diet-controlled type 2 diabetes mellitus (HCC) 03/24/2018   Microalbuminuria 03/24/2018   Dizziness 11/17/2017   Simple chronic bronchitis (HCC) 09/22/2017   SOBOE (shortness of breath on exertion) 02/18/2017   Chronic midline low back pain without sciatica 12/29/2016   Periodic limb movement disorder 12/29/2016   Benign essential tremor 06/22/2016   Pure hypercholesterolemia 06/20/2015   Erectile dysfunction due to arterial insufficiency 06/16/2015   Unstable angina (HCC) 01/22/2015   Abdominal aortic 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 rupture (HCC) 12/31/2014   Benign prostatic hypertrophy without urinary obstruction 07/31/2014   Enlarged prostate 07/31/2014   Benign essential HTN 06/11/2014   Carotid artery narrowing 02/08/2014   Carotid artery obstruction 02/08/2014   Bilateral carotid artery stenosis 02/08/2014   Chest pain 08/20/2013   Bilateral cataracts 05/16/2013   Cataract 05/16/2013   Clinical depression 12/12/2012   Diabetes mellitus, type 2 (HCC) 12/12/2012   Combined fat and carbohydrate induced hyperlipemia 12/12/2012   Major depressive disorder, single episode, unspecified 12/12/2012   Acid  reflux 11/09/2012   Presence of stent in coronary artery 11/09/2012   BP (high blood pressure) 11/09/2012   Healed myocardial infarct 11/09/2012   Gastro-esophageal reflux disease without esophagitis 11/09/2012   History of cardiovascular surgery 11/09/2012   Fothergill's neuralgia 08/07/2012   Swelling of testicle 04/06/2012   Disorder of male genital organ 04/06/2012   Swelling of the testicles 04/06/2012   Arteriosclerosis of coronary artery 11/16/2011   CAD in native artery 11/16/2011   Fatigue 06/04/2011   Avitaminosis D 11/30/2010    ONSET DATE: 03/17/2022- Worsening symptoms  REFERRING DIAG:  G20.A1 (ICD-10-CM) - Parkinsons  R26.2 (ICD-10-CM) - Difficulty walking    THERAPY DIAG:  Difficulty in walking, not elsewhere classified  Muscle weakness (generalized)  Abnormality of gait and mobility  Unsteadiness on feet  Other abnormalities of gait and mobility  Other lack of coordination  Chronic bilateral low back pain without sciatica  Rationale  for Evaluation and Treatment: Rehabilitation  SUBJECTIVE:                                                                                                                                                                                             SUBJECTIVE STATEMENT:  Patient reports feeling okay with no new issues. States seems to be walking better- no stumbles or falls.      Patient reports 0/10  Pt accompanied by: significant other  PERTINENT HISTORY: Patient is a 77 year old male with referral to outpatient PT for Parkinsons and difficulty walking. He was recently seen by Neurology worsening symptoms of parkinson- difficulty with walking. He has past medical history of Arthritis, Bladder cancer, and Trigeminal Neuralgia.  PAIN:  Are you having pain? NO  PRECAUTIONS: Fall  WEIGHT BEARING RESTRICTIONS: No  FALLS: Has patient fallen in last 6 months? Yes. Number of falls 1  LIVING ENVIRONMENT: Lives with: lives  with their spouse Lives in: House/apartment Stairs: Yes: External: 2 steps; has grab bar Has following equipment at home: Dan Humphreys - 2 wheeled, Environmental consultant - 4 wheeled, and Grab bars  PLOF: Independent with household mobility with device, Independent with community mobility with device, Independent with transfers, and Requires assistive device for independence  PATIENT GOALS: Get to where I can walk better again and get my legs stronger.   OBJECTIVE:    TODAY'S TREATMENT:                                                                                                                              DATE: 11/08/22   Therex:  Seated hip march 3# AW 2 sets of 15 reps Seated knee ext with 3 sec hold 3# AW - x 15 reps Seated hip add (ball squeeze) 3sec hold x 15 reps Seated leg lift - 3# up/over orange hurdle x 15 reps and LE  Resistive walking with upright 4WW 3# AW 300 feet  Sit to stand with overhead Y raise x 15 reps Resistive walking with upright 4WW 3#AW 300 feet   Pt required occasional  rest breaks due fatigue, PT was quick to ask when pt appeared to be fatiguing in order to prevent excessive fatigue.        PATIENT EDUCATION: Education details: Plan of care; purpose of functional outcome measures.  Person educated: Patient Education method: Explanation, Demonstration, Tactile cues, Verbal cues, and Handouts Education comprehension: verbalized understanding, returned demonstration, verbal cues required, tactile cues required, and needs further education  HOME EXERCISE PROGRAM: Access Code: [email or web address] URL: [email or web address] Date: 08/11/2022 Prepared by: Maureen Ralphs  Exercises - Seated Lumbar Flexion Stretch  - 7 x weekly - 3 sets - 30 sec hold - Seated Thoracic Flexion and Rotation with Arms Crossed  - 1 x daily - 7 x weekly - 3 sets - 30 sec hold - Seated Figure 4 Piriformis Stretch  - 1 x daily - 7 x weekly - 3 sets - 10 reps  GOALS: Goals reviewed  with patient? Yes  SHORT TERM GOALS: Target date: 09/20/2022  Pt will be independent with a comprehensive HEP in order to improve strength and balance in order to decrease fall risk and improve function at home and work.  Baseline: EVAL: Patient admits not performing much exercises - not moving around well. 10/25/2022-Patient reports not as compliant with previous HEP as instructed- Verbally reviewed Low back, cervical, thoracic stretching and importance of daily walking. 11/03/2022= Patient reports independent with cervical Stretches and some walking at home Goal status: MET   LONG TERM GOALS: Target date: 01/26/2023  Pt will improve FOTO to target score of 45 % to display perceived improvements in ability to complete ADL's.  Baseline: EVAL: 42: 11/03/2022= 55 Goal status: MET  2.  Pt will decrease 5TSTS by at least 5 seconds in order to demonstrate clinically significant improvement in LE strength.  Baseline: EVAL = 17.39 sec without UE support; 10/25/2022= 12.91 sec Goal status: PROGRESSING  3. Pt will increase by at least 0.15 m/s in order to demonstrate clinically significant improvement in community ambulation   Baseline: EVAL= 0.67 m/s; 10/25/2022= 0.77 m/s Goal status: PROGRESSING  4.  Pt will improve BERG by at least 3 points in order to demonstrate clinically significant improvement in balance.   Baseline: EVAL: to be assessed next visit: 08/23/2022= 36/56; 11/03/2022= 40/56 Goal status: MET  5.  Pt will decrease TUG to below 14 seconds/decrease in order to demonstrate decreased fall risk. Baseline: EVAL: 22.00 sec with upright 4WW; 10/25/2022= 15.98 sec with 4WW Goal status: PROGRESSING  6.  Pt will increase by at least 70m (180ft) in order to demonstrate clinically significant improvement in cardiopulmonary endurance and community ambulation  Baseline: EVAL: 3:45 min (450 feet with upright 4WW) 10/25/2022= 830 feet with 4WW Goal status: MET  7.   Patient will report  returning to walking in to community places using most appropriate assistive device vs current use of scooter for improved abilities with mobility during community outings Baseline: EVAL: Current using scooter for many social/community outings; 10/25/2022- Paitent continues to scooter for long distance community distances- due to some fatigue or SOB.  Goal status: ONGOING  8.  Pt will increase > 1000 feet in order to demonstrate clinically significant improvement in cardiopulmonary endurance and community ambulation   Baseline: 10/25/2022= 830 feet with 4WW  Goal status= NEW  9.  Pt will improve BERG by at least 3 points in order to demonstrate clinically significant improvement in balance.   Baseline: 11/03/2022= 40/56 Goal status: NEW  ASSESSMENT:  CLINICAL IMPRESSION: Treatment focused on LE  Strengthening/mm endurance on balance and LE strengthening. Patient performed several dynamic and static balance with improving performance with practice and less overall hand support. He is also performing sit to stand without UE support well without any observed fatigue today. Pt will continue to benefit from skilled PT services to address deficits and impairment identified in evaluation in order to maximize independence and safety in basic mobility required for performance of ADL, IADL, and leisure.    OBJECTIVE IMPAIRMENTS: Abnormal gait, cardiopulmonary status limiting activity, decreased activity tolerance, decreased balance, decreased coordination, decreased endurance, decreased mobility, difficulty walking, decreased ROM, decreased strength, hypomobility, impaired flexibility, impaired sensation, impaired UE functional use, postural dysfunction, obesity, and pain.   ACTIVITY LIMITATIONS: carrying, lifting, bending, standing, squatting, stairs, transfers, continence, and reach over head  PARTICIPATION LIMITATIONS: meal prep, cleaning, laundry, driving, shopping, community activity, and yard  work  PERSONAL FACTORS: Age and 3+ comorbidities: HTN, Arthritis, Low back pain, Trigeminal Neuralgia  are also affecting patient's functional outcome.   REHAB POTENTIAL: Good  CLINICAL DECISION MAKING: Evolving/moderate complexity  EVALUATION COMPLEXITY: Moderate  PLAN:  PT FREQUENCY: 1-2x/week  PT DURATION: 12 weeks  PLANNED INTERVENTIONS: Therapeutic exercises, Therapeutic activity, Neuromuscular re-education, Balance training, Gait training, Patient/Family education, Self Care, Joint mobilization, Stair training, Vestibular training, Canalith repositioning, DME instructions, Dry Needling, Electrical stimulation, Spinal manipulation, Spinal mobilization, Cryotherapy, Moist heat, Manual therapy, and Re-evaluation  PLAN FOR NEXT SESSION: Progress therex, bed mobility, and balance training. Initiate training at [facility] for UE/LE strengthening and cardiovascular training.    Lenda Kelp, PT 11/08/2022, 11:12 AM  11:12 AM, 11/08/22  Physical Therapist - [facility]  Outpatient Physical Therapy- Main Campus [phone]

## 2022-11-10 ENCOUNTER — Ambulatory Visit: Payer: Medicare HMO

## 2022-11-10 DIAGNOSIS — R262 Difficulty in walking, not elsewhere classified: Secondary | ICD-10-CM | POA: Diagnosis not present

## 2022-11-10 DIAGNOSIS — R2681 Unsteadiness on feet: Secondary | ICD-10-CM

## 2022-11-10 DIAGNOSIS — R2689 Other abnormalities of gait and mobility: Secondary | ICD-10-CM | POA: Diagnosis not present

## 2022-11-10 DIAGNOSIS — R278 Other lack of coordination: Secondary | ICD-10-CM | POA: Diagnosis not present

## 2022-11-10 DIAGNOSIS — R269 Unspecified abnormalities of gait and mobility: Secondary | ICD-10-CM | POA: Diagnosis not present

## 2022-11-10 DIAGNOSIS — G8929 Other chronic pain: Secondary | ICD-10-CM | POA: Diagnosis not present

## 2022-11-10 DIAGNOSIS — M545 Low back pain, unspecified: Secondary | ICD-10-CM | POA: Diagnosis not present

## 2022-11-10 DIAGNOSIS — M6281 Muscle weakness (generalized): Secondary | ICD-10-CM

## 2022-11-10 NOTE — Therapy (Signed)
OUTPATIENT PHYSICAL THERAPY NEURO TREATMENT  Patient Name: [name] MRN: [id number] DOB:06/05/1945, 77 y.o., male Today's Date: 11/10/2022   PCP: Ardyth Man, PA- C REFERRING PROVIDER: Morene Crocker, MD  END OF SESSION:  PT End of Session - 11/10/22 0909     Visit Number 25    Number of Visits 46    Date for PT Re-Evaluation 01/26/23    Authorization Type [employer]    Progress Note Due on Visit 30    PT Start Time 0845    PT Stop Time 0928    PT Time Calculation (min) 43 min    Equipment Utilized During Treatment Gait belt    Activity Tolerance Patient tolerated treatment well;No increased pain    Behavior During Therapy [facility] for tasks assessed/performed                      Past Medical History:  Diagnosis Date   Anxiety    Arteriosclerosis of coronary artery 11/16/2011   Overview:  Stent 10/2011 stent rca 2015 with collaterals to lad which is chronically occluded    Benign enlargement of prostate    Benign essential HTN 06/11/2014   Benign prostatic hypertrophy without urinary obstruction 07/31/2014   Bilateral cataracts 05/16/2013   Overview:  [facility]     Bone spur of foot    Left   BP (high blood pressure) 11/09/2012   Cancer (HCC)    skin (forehead) and bladder   Carotid artery narrowing 02/08/2014   Depression    Detrusor hypertrophy    Diabetes (HCC)    Diabetes mellitus, type 2 (HCC) 12/12/2012   Diverticulosis    Dyspnea    Esophageal reflux    Esophageal reflux    Fothergill's neuralgia 08/07/2012   Overview:  [facility]    Gastritis    GERD (gastroesophageal reflux disease)    Headache    cluster headaches   Healed myocardial infarct 11/09/2012   Hearing loss in left ear    Heart disease    Hematuria    Hemorrhoids    History of hiatal hernia 12/14/2017   small    Hypercholesteremia    Lesion of bladder    Myocardial infarct (HCC)    Presence of stent in coronary artery 11/09/2012   Rectal bleeding     Trigeminal neuralgia    Trigeminal neuralgia    Valvular heart disease    Vitamin D deficiency    Past Surgical History:  Procedure Laterality Date   APPENDECTOMY     BOTOX INJECTION N/A 12/21/2017   Procedure: Bladder BOTOX INJECTION;  Surgeon: Vanna Scotland, MD;  Location: [facility] ORS;  Service: Urology;  Laterality: N/A;   BOTOX INJECTION N/A 09/11/2018   Procedure: Bladder BOTOX INJECTION;  Surgeon: Vanna Scotland, MD;  Location: [facility] ORS;  Service: Urology;  Laterality: N/A;   CARDIAC CATHETERIZATION     CARDIAC CATHETERIZATION N/A 01/22/2015   Procedure: Left Heart Cath;  Surgeon: Lamar Blinks, MD;  Location: [facility] INVASIVE CV LAB;  Service: Cardiovascular;  Laterality: N/A;   CARDIAC CATHETERIZATION N/A 01/22/2015   Procedure: Coronary Stent Intervention;  Surgeon: Marcina Millard, MD;  Location: [facility] INVASIVE CV LAB;  Service: Cardiovascular;  Laterality: N/A;   CATARACT EXTRACTION, BILATERAL     COLONOSCOPY WITH PROPOFOL N/A 12/08/2015   Procedure: COLONOSCOPY WITH PROPOFOL;  Surgeon: Christena Deem, MD;  Location: [facility] ENDOSCOPY;  Service: Endoscopy;  Laterality: N/A;   COLONOSCOPY WITH PROPOFOL N/A  12/09/2015   Procedure: COLONOSCOPY WITH PROPOFOL;  Surgeon: Christena Deem, MD;  Location: [facility] ENDOSCOPY;  Service: Endoscopy;  Laterality: N/A;   CORONARY ANGIOPLASTY     5 stents   CORONARY STENT PLACEMENT  2015   x5   CYSTOSCOPY N/A 09/11/2018   Procedure: CYSTOSCOPY;  Surgeon: Vanna Scotland, MD;  Location: [facility] ORS;  Service: Urology;  Laterality: N/A;   CYSTOSCOPY WITH BIOPSY N/A 12/21/2017   Procedure: CYSTOSCOPY WITH BIOPSY;  Surgeon: Vanna Scotland, MD;  Location: [facility] ORS;  Service: Urology;  Laterality: N/A;   ESOPHAGOGASTRODUODENOSCOPY (EGD) WITH PROPOFOL N/A 12/08/2015   Procedure: ESOPHAGOGASTRODUODENOSCOPY (EGD) WITH PROPOFOL;  Surgeon: Christena Deem, MD;  Location: [facility] ENDOSCOPY;  Service: Endoscopy;  Laterality: N/A;    ESOPHAGOGASTRODUODENOSCOPY (EGD) WITH PROPOFOL N/A 12/13/2017   Procedure: ESOPHAGOGASTRODUODENOSCOPY (EGD) WITH PROPOFOL;  Surgeon: Christena Deem, MD;  Location: [facility] ENDOSCOPY;  Service: Endoscopy;  Laterality: N/A;   EYE SURGERY     HERNIA REPAIR     kidney tumor remove     TRANSURETHRAL RESECTION OF BLADDER TUMOR WITH GYRUS (TURBT-GYRUS)  12/2013   UMBILICAL HERNIA REPAIR     urethral meatotomy     Patient Active Problem List   Diagnosis Date Noted   Class 2 obesity due to excess calories with body mass index (BMI) of 36.0 to 36.9 in adult 04/11/2020   Swelling of limb 03/28/2020   Lymphedema 03/28/2020   Trigeminal neuralgia 12/25/2019   Lower limb ulcer, calf, left, limited to breakdown of skin (HCC) 12/25/2019   OSA (obstructive sleep apnea) 01/23/2019   Acute systolic CHF (congestive heart failure) (HCC) 12/12/2018   Bruising 07/10/2018   Diet-controlled type 2 diabetes mellitus (HCC) 03/24/2018   Microalbuminuria 03/24/2018   Dizziness 11/17/2017   Simple chronic bronchitis (HCC) 09/22/2017   SOBOE (shortness of breath on exertion) 02/18/2017   Chronic midline low back pain without sciatica 12/29/2016   Periodic limb movement disorder 12/29/2016   Benign essential tremor 06/22/2016   Pure hypercholesterolemia 06/20/2015   Erectile dysfunction due to arterial insufficiency 06/16/2015   Unstable angina (HCC) 01/22/2015   Abdominal aortic 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 rupture (HCC) 12/31/2014   Benign prostatic hypertrophy without urinary obstruction 07/31/2014   Enlarged prostate 07/31/2014   Benign essential HTN 06/11/2014   Carotid artery narrowing 02/08/2014   Carotid artery obstruction 02/08/2014   Bilateral carotid artery stenosis 02/08/2014   Chest pain 08/20/2013   Bilateral cataracts 05/16/2013   Cataract 05/16/2013   Clinical depression 12/12/2012   Diabetes mellitus, type 2 (HCC) 12/12/2012   Combined fat and carbohydrate induced hyperlipemia 12/12/2012    Major depressive disorder, single episode, unspecified 12/12/2012   Acid reflux 11/09/2012   Presence of stent in coronary artery 11/09/2012   BP (high blood pressure) 11/09/2012   Healed myocardial infarct 11/09/2012   Gastro-esophageal reflux disease without esophagitis 11/09/2012   History of cardiovascular surgery 11/09/2012   Fothergill's neuralgia 08/07/2012   Swelling of testicle 04/06/2012   Disorder of male genital organ 04/06/2012   Swelling of the testicles 04/06/2012   Arteriosclerosis of coronary artery 11/16/2011   CAD in native artery 11/16/2011   Fatigue 06/04/2011   Avitaminosis D 11/30/2010    ONSET DATE: 03/17/2022- Worsening symptoms  REFERRING DIAG:  G20.A1 (ICD-10-CM) - Parkinsons  R26.2 (ICD-10-CM) - Difficulty walking    THERAPY DIAG:  Difficulty in walking, not elsewhere classified  Muscle weakness (generalized)  Abnormality of gait and mobility  Unsteadiness on feet  Other abnormalities of gait and  mobility  Other lack of coordination  Chronic bilateral low back pain without sciatica  Rationale for Evaluation and Treatment: Rehabilitation  SUBJECTIVE:                                                                                                                                                                                             SUBJECTIVE STATEMENT:  Patient reports not moving as fast this morning.      Patient reports 0/10  Pt accompanied by: significant other  PERTINENT HISTORY: Patient is a 77 year old male with referral to outpatient PT for Parkinsons and difficulty walking. He was recently seen by Neurology worsening symptoms of parkinson- difficulty with walking. He has past medical history of Arthritis, Bladder cancer, and Trigeminal Neuralgia.  PAIN:  Are you having pain? NO  PRECAUTIONS: Fall  WEIGHT BEARING RESTRICTIONS: No  FALLS: Has patient fallen in last 6 months? Yes. Number of falls 1  LIVING  ENVIRONMENT: Lives with: lives with their spouse Lives in: House/apartment Stairs: Yes: External: 2 steps; has grab bar Has following equipment at home: Dan Humphreys - 2 wheeled, Environmental consultant - 4 wheeled, and Grab bars  PLOF: Independent with household mobility with device, Independent with community mobility with device, Independent with transfers, and Requires assistive device for independence  PATIENT GOALS: Get to where I can walk better again and get my legs stronger.   OBJECTIVE:    TODAY'S TREATMENT:                                                                                                                              DATE: 11/10/2022     Therex:   Nustep- L2- 8 min BUE/LE support - o2 sat = 97% at 1/2 way and 95% at finish.  Resistive gait 2.t  Seated hip march 3# AW 2 sets of 15 reps Seated knee ext with 3 sec hold 3# AW - x 15 reps Seated hip add (ball squeeze) 3sec hold x 15 reps Seated leg lift - 3# up/over orange hurdle 2x 10 reps and LE  Resistive  walking with upright 4WW 3# AW 300 feet  Sit to stand with overhead Y raise x 15 reps Resistive walking with upright 4WW 3#AW 300 feet   Pt required occasional rest breaks due fatigue, PT was quick to ask when pt appeared to be fatiguing in order to prevent excessive fatigue.        PATIENT EDUCATION: Education details: Plan of care; purpose of functional outcome measures.  Person educated: Patient Education method: Explanation, Demonstration, Tactile cues, Verbal cues, and Handouts Education comprehension: verbalized understanding, returned demonstration, verbal cues required, tactile cues required, and needs further education  HOME EXERCISE PROGRAM: Access Code: [email or web address] URL: [email or web address] Date: 08/11/2022 Prepared by: Maureen Ralphs  Exercises - Seated Lumbar Flexion Stretch  - 7 x weekly - 3 sets - 30 sec hold - Seated Thoracic Flexion and Rotation with Arms Crossed  - 1 x daily - 7 x  weekly - 3 sets - 30 sec hold - Seated Figure 4 Piriformis Stretch  - 1 x daily - 7 x weekly - 3 sets - 10 reps  GOALS: Goals reviewed with patient? Yes  SHORT TERM GOALS: Target date: 09/20/2022  Pt will be independent with a comprehensive HEP in order to improve strength and balance in order to decrease fall risk and improve function at home and work.  Baseline: EVAL: Patient admits not performing much exercises - not moving around well. 10/25/2022-Patient reports not as compliant with previous HEP as instructed- Verbally reviewed Low back, cervical, thoracic stretching and importance of daily walking. 11/03/2022= Patient reports independent with cervical Stretches and some walking at home Goal status: MET   LONG TERM GOALS: Target date: 01/26/2023  Pt will improve FOTO to target score of 45 % to display perceived improvements in ability to complete ADL's.  Baseline: EVAL: 42: 11/03/2022= 55 Goal status: MET  2.  Pt will decrease 5TSTS by at least 5 seconds in order to demonstrate clinically significant improvement in LE strength.  Baseline: EVAL = 17.39 sec without UE support; 10/25/2022= 12.91 sec Goal status: PROGRESSING  3. Pt will increase by at least 0.15 m/s in order to demonstrate clinically significant improvement in community ambulation   Baseline: EVAL= 0.67 m/s; 10/25/2022= 0.77 m/s Goal status: PROGRESSING  4.  Pt will improve BERG by at least 3 points in order to demonstrate clinically significant improvement in balance.   Baseline: EVAL: to be assessed next visit: 08/23/2022= 36/56; 11/03/2022= 40/56 Goal status: MET  5.  Pt will decrease TUG to below 14 seconds/decrease in order to demonstrate decreased fall risk. Baseline: EVAL: 22.00 sec with upright 4WW; 10/25/2022= 15.98 sec with 4WW Goal status: PROGRESSING  6.  Pt will increase by at least 38m (165ft) in order to demonstrate clinically significant improvement in cardiopulmonary endurance and community  ambulation  Baseline: EVAL: 3:45 min (450 feet with upright 4WW) 10/25/2022= 830 feet with 4WW Goal status: MET  7.   Patient will report returning to walking in to community places using most appropriate assistive device vs current use of scooter for improved abilities with mobility during community outings Baseline: EVAL: Current using scooter for many social/community outings; 10/25/2022- Paitent continues to scooter for long distance community distances- due to some fatigue or SOB.  Goal status: ONGOING  8.  Pt will increase > 1000 feet in order to demonstrate clinically significant improvement in cardiopulmonary endurance and community ambulation   Baseline: 10/25/2022= 830 feet with 4WW  Goal status= NEW  9.  Pt will improve  BERG by at least 3 points in order to demonstrate clinically significant improvement in balance.   Baseline: 11/03/2022= 40/56 Goal status: NEW  ASSESSMENT:  CLINICAL IMPRESSION: Treatment focused on functional endurance today- Increased time on Nustep and walking today. Patient responded well with O2 sat as well 95 and better today.  Pt will continue to benefit from skilled PT services to address deficits and impairment identified in evaluation in order to maximize independence and safety in basic mobility required for performance of ADL, IADL, and leisure.    OBJECTIVE IMPAIRMENTS: Abnormal gait, cardiopulmonary status limiting activity, decreased activity tolerance, decreased balance, decreased coordination, decreased endurance, decreased mobility, difficulty walking, decreased ROM, decreased strength, hypomobility, impaired flexibility, impaired sensation, impaired UE functional use, postural dysfunction, obesity, and pain.   ACTIVITY LIMITATIONS: carrying, lifting, bending, standing, squatting, stairs, transfers, continence, and reach over head  PARTICIPATION LIMITATIONS: meal prep, cleaning, laundry, driving, shopping, community activity, and yard  work  PERSONAL FACTORS: Age and 3+ comorbidities: HTN, Arthritis, Low back pain, Trigeminal Neuralgia  are also affecting patient's functional outcome.   REHAB POTENTIAL: Good  CLINICAL DECISION MAKING: Evolving/moderate complexity  EVALUATION COMPLEXITY: Moderate  PLAN:  PT FREQUENCY: 1-2x/week  PT DURATION: 12 weeks  PLANNED INTERVENTIONS: Therapeutic exercises, Therapeutic activity, Neuromuscular re-education, Balance training, Gait training, Patient/Family education, Self Care, Joint mobilization, Stair training, Vestibular training, Canalith repositioning, DME instructions, Dry Needling, Electrical stimulation, Spinal manipulation, Spinal mobilization, Cryotherapy, Moist heat, Manual therapy, and Re-evaluation  PLAN FOR NEXT SESSION: Progress therex, bed mobility, and balance training. Initiate training at [facility] for UE/LE strengthening and cardiovascular training.    Lenda Kelp, PT 11/10/2022, 10:40 AM  10:40 AM, 11/10/22  Physical Therapist - [facility]  Outpatient Physical Therapy- Main Campus [phone]

## 2022-11-11 ENCOUNTER — Other Ambulatory Visit: Payer: Self-pay | Admitting: Urology

## 2022-11-15 ENCOUNTER — Encounter: Payer: Self-pay | Admitting: Dermatology

## 2022-11-15 ENCOUNTER — Ambulatory Visit: Payer: Medicare HMO

## 2022-11-15 ENCOUNTER — Ambulatory Visit: Payer: Medicare HMO | Admitting: Dermatology

## 2022-11-15 VITALS — BP 107/67 | HR 70

## 2022-11-15 DIAGNOSIS — L570 Actinic keratosis: Secondary | ICD-10-CM | POA: Diagnosis not present

## 2022-11-15 DIAGNOSIS — Z8669 Personal history of other diseases of the nervous system and sense organs: Secondary | ICD-10-CM | POA: Diagnosis not present

## 2022-11-15 DIAGNOSIS — W908XXA Exposure to other nonionizing radiation, initial encounter: Secondary | ICD-10-CM

## 2022-11-15 DIAGNOSIS — L578 Other skin changes due to chronic exposure to nonionizing radiation: Secondary | ICD-10-CM | POA: Diagnosis not present

## 2022-11-15 DIAGNOSIS — L219 Seborrheic dermatitis, unspecified: Secondary | ICD-10-CM | POA: Diagnosis not present

## 2022-11-15 DIAGNOSIS — Z7189 Other specified counseling: Secondary | ICD-10-CM

## 2022-11-15 MED ORDER — HYDROCORTISONE 2.5 % EX CREA: TOPICAL_CREAM | CUTANEOUS | 3 refills | Status: DC

## 2022-11-15 MED ORDER — CLOBETASOL PROPIONATE 0.05 % EX SOLN: CUTANEOUS | 5 refills | Status: DC

## 2022-11-15 NOTE — Patient Instructions (Signed)
Due to recent changes in healthcare laws, you may see results of your pathology and/or laboratory studies on MyChart before the doctors have had a chance to review them. We understand that in some cases there may be results that are confusing or concerning to you. Please understand that not all results are received at the same time and often the doctors may need to interpret multiple results in order to provide you with the best plan of care or course of treatment. Therefore, we ask that you please give [place] 2 business days to thoroughly review all your results before contacting the office for clarification. Should we see a critical lab result, you will be contacted sooner.   If You Need Anything After Your Visit  If you have any questions or concerns for your doctor, please call our main line at [phone] and press option 4 to reach your doctor's medical assistant. If no one answers, please leave a voicemail as directed and we will return your call as soon as possible. Messages left after 4 pm will be answered the following business day.   You may also send [place] a message via MyChart. We typically respond to MyChart messages within 1-2 business days.  For prescription refills, please ask your pharmacy to contact our office. Our fax number is [phone].  If you have an urgent issue when the clinic is closed that cannot wait until the next business day, you can page your doctor at the number below.    Please note that while we do our best to be available for urgent issues outside of office hours, we are not available 24/7.   If you have an urgent issue and are unable to reach [place], you may choose to seek medical care at your doctor's office, retail clinic, urgent care center, or emergency room.  If you have a medical emergency, please immediately call 911 or go to the emergency department.  Pager Numbers  - Dr. Gwen Pounds: [phone]  - Dr. Roseanne Reno: [phone]  - Dr. Katrinka Blazing: [phone]    In the event of inclement weather, please call our main line at [phone] for an update on the status of any delays or closures.  Dermatology Medication Tips: Please keep the boxes that topical medications come in in order to help keep track of the instructions about where and how to use these. Pharmacies typically print the medication instructions only on the boxes and not directly on the medication tubes.   If your medication is too expensive, please contact our office at [phone] option 4 or send [place] a message through MyChart.   We are unable to tell what your co-pay for medications will be in advance as this is different depending on your insurance coverage. However, we may be able to find a substitute medication at lower cost or fill out paperwork to get insurance to cover a needed medication.   If a prior authorization is required to get your medication covered by your insurance company, please allow [place] 1-2 business days to complete this process.  Drug prices often vary depending on where the prescription is filled and some pharmacies may offer cheaper prices.  The website www.goodrx.com contains coupons for medications through different pharmacies. The prices here do not account for what the cost may be with help from insurance (it may be cheaper with your insurance), but the website can give you the price if you did not use any insurance.  - You can print the associated coupon and take it  with your prescription to the pharmacy.  - You may also stop by our office during regular business hours and pick up a GoodRx coupon card.  - If you need your prescription sent electronically to a different pharmacy, notify our office through [facility] or by phone at [phone] option 4.     Si Usted Necesita Algo Despus de Su Visita  Tambin puede enviarnos un mensaje a travs de Clinical cytogeneticist. Por lo general respondemos a los mensajes de MyChart en el transcurso de 1 a 2 das  hbiles.  Para renovar recetas, por favor pida a su farmacia que se ponga en contacto con nuestra oficina. Annie Sable de fax es [place] [phone].  Si tiene un asunto urgente cuando la clnica est cerrada y que no puede esperar hasta el siguiente da hbil, puede llamar/localizar a su doctor(a) al nmero que aparece a continuacin.   Por favor, tenga en cuenta que aunque hacemos todo lo posible para estar disponibles para asuntos urgentes fuera del horario de [place], no estamos disponibles las 24 horas del da, los 7 [address]  [address] de la [place].   Si tiene un problema urgente y no puede comunicarse con nosotros, puede optar por buscar atencin mdica  en el consultorio de su doctor(a), en una clnica privada, en un centro de atencin urgente o en una sala de emergencias.  Si tiene Engineer, drilling, por favor llame inmediatamente al 911 o vaya a la sala de emergencias.  Nmeros de bper  - Dr. Gwen Pounds: [phone]  - Dra. Roseanne Reno: [phone]  - Dr. Katrinka Blazing: [phone]   En caso de inclemencias del tiempo, por favor llame a Lacy Duverney principal al [phone] para una actualizacin sobre el [place] de cualquier retraso o cierre.  Consejos para la medicacin en dermatologa: Por favor, guarde las cajas en las que vienen los medicamentos de uso tpico para ayudarle a seguir las instrucciones sobre dnde y cmo usarlos. Las farmacias generalmente imprimen las instrucciones del medicamento slo en las cajas y no directamente en los tubos del [place].   Si su medicamento es muy caro, por favor, pngase en contacto con Rolm Gala llamando al [phone] y presione la opcin 4 o envenos un mensaje a travs de Clinical cytogeneticist.   No podemos decirle cul ser su copago por los medicamentos por adelantado ya que esto es diferente dependiendo de la cobertura de su seguro. Sin embargo, es posible que podamos encontrar un medicamento sustituto a Audiological scientist un formulario para que el  seguro cubra el medicamento que se considera necesario.   Si se requiere una autorizacin previa para que su compaa de seguros [place] su medicamento, por favor permtanos de 1 a 2 das hbiles para completar [address]  Los precios de los medicamentos varan con frecuencia dependiendo del Environmental consultant de dnde se surte la receta y alguna farmacias pueden ofrecer precios ms baratos.  El sitio web www.goodrx.com tiene cupones para medicamentos de Health and safety inspector. Los precios aqu no tienen en cuenta lo que podra costar con la ayuda del seguro (puede ser ms barato con su seguro), pero el sitio web puede darle el precio si no utiliz Tourist information centre manager.  - Puede imprimir el cupn correspondiente y llevarlo con su receta a la farmacia.  - Tambin puede pasar por nuestra oficina durante el horario de atencin regular y Education officer, museum una tarjeta de cupones de GoodRx.  - Si necesita que su receta se enve electrnicamente a Psychiatrist, informe a nuestra oficina a travs de MyChart de  [place] o por telfono llamando al [phone] y presione la opcin 4.

## 2022-11-15 NOTE — Progress Notes (Signed)
Follow-Up Visit   Subjective  [name] is a 77 y.o. male who presents for the following: seborrheic dermatitis follow up of the face and ears, Pimecrolimus 0.1% cream was $95 and not affordable for patient. He is still using Ketoconazole 2% cream and shampoo, as well as Fluocinolone oil in the ears. He has good and bad days, today is a good day as far as the seborrheic dermatitis on the face. Patient's wife [name] states that his ears have especially flared lately and have a lot of scale.   Patient accompanied by wife [name] who contributes to history.  The patient has spots, moles and lesions to be evaluated, some may be new or changing and the patient may have concern these could be cancer.   The following portions of the chart were reviewed this encounter and updated as appropriate: medications, allergies, medical history  Review of Systems:  No other skin or systemic complaints except as noted in HPI or Assessment and Plan.  Objective  Well appearing patient in no apparent distress; mood and affect are within normal limits.  A focused examination was performed of the following areas: the face, scalp, ears, arms, and hands.  Relevant exam findings are noted in the Assessment and Plan.   Assessment & Plan   ACTINIC DAMAGE - chronic, secondary to cumulative UV radiation exposure/sun exposure over time - diffuse scaly erythematous macules with underlying dyspigmentation - Recommend daily broad spectrum sunscreen SPF 30+ to sun-exposed areas, reapply every 2 hours as needed.  - Recommend staying in the shade or wearing long sleeves, sun glasses (UVA+UVB protection) and wide brim hats (4-inch brim around the entire circumference of the hat). - Call for new or changing lesions.  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 Condition is  symptomatic/ bothersome to patient. Not currently at goal.  Seborrheic Dermatitis is a chronic persistent rash characterized by pinkness and scaling most commonly of the mid face but also can occur on the scalp (dandruff), ears; mid chest, mid back and groin.  It tends to be exacerbated by stress and cooler weather.  People who have neurologic disease may experience new onset or exacerbation of existing seborrheic dermatitis.  The condition is not curable but treatable and can be controlled.  Treatment Plan: Elidel too expensive Continue the Ketoconazole 2% shampoo 3d/wk, Ketoconazole 2% cream QD, and Fluocinolone oil 1-2 drops QD-BID PRN.  Start Clobetasol 0.05% solution to aa's scalp QD-BID PRN.  Restart Hydrocortisone 2.5% cream to aa's face QD PRN.   ACTINIC KERATOSIS Exam: Erythematous thin papules/macules with gritty scale at the face and exposed vertex scalp.  Actinic keratoses are precancerous spots that appear secondary to cumulative UV radiation exposure/sun exposure over time. They are chronic with expected duration over 1 year. A portion of actinic keratoses will progress to squamous cell carcinoma of the skin. It is not possible to reliably predict which spots will progress to skin cancer and so treatment is recommended to prevent development of skin cancer.  Recommend daily broad spectrum sunscreen SPF 30+ to sun-exposed areas, reapply every 2 hours as needed.  Recommend staying in the shade or wearing long sleeves, sun glasses (UVA+UVB protection) and wide brim hats (4-inch brim around the entire circumference of the hat). Call for new or changing lesions.  Treatment Plan: Plan to treat once seborrheic dermatitis is better controlled.  Return in about 1 month (around 12/15/2022) for seborrheic dermatitis follow up.  [name], CMA, am acting as scribe for [name] Goody, MD .   Documentation: I have reviewed the above documentation for accuracy and completeness,  and I agree with the above.  [name] Goody, MD

## 2022-11-16 DIAGNOSIS — J439 Emphysema, unspecified: Secondary | ICD-10-CM | POA: Diagnosis not present

## 2022-11-17 ENCOUNTER — Ambulatory Visit: Payer: Medicare HMO

## 2022-11-17 DIAGNOSIS — R278 Other lack of coordination: Secondary | ICD-10-CM

## 2022-11-17 DIAGNOSIS — M6281 Muscle weakness (generalized): Secondary | ICD-10-CM

## 2022-11-17 DIAGNOSIS — G8929 Other chronic pain: Secondary | ICD-10-CM | POA: Diagnosis not present

## 2022-11-17 DIAGNOSIS — R262 Difficulty in walking, not elsewhere classified: Secondary | ICD-10-CM

## 2022-11-17 DIAGNOSIS — R2681 Unsteadiness on feet: Secondary | ICD-10-CM | POA: Diagnosis not present

## 2022-11-17 DIAGNOSIS — R2689 Other abnormalities of gait and mobility: Secondary | ICD-10-CM

## 2022-11-17 DIAGNOSIS — R269 Unspecified abnormalities of gait and mobility: Secondary | ICD-10-CM | POA: Diagnosis not present

## 2022-11-17 DIAGNOSIS — M545 Low back pain, unspecified: Secondary | ICD-10-CM | POA: Diagnosis not present

## 2022-11-17 NOTE — Therapy (Signed)
OUTPATIENT PHYSICAL THERAPY NEURO TREATMENT  Patient Name: [name] MRN: [id number] DOB:Jul 17, 1945, 77 y.o., male Today's Date: 11/17/2022   PCP: Ardyth Man, PA- C REFERRING PROVIDER: Morene Crocker, MD  END OF SESSION:  PT End of Session - 11/17/22 1110     Visit Number 26    Number of Visits 46    Date for PT Re-Evaluation 01/26/23    Authorization Type [employer]    Progress Note Due on Visit 30    PT Start Time 1100    PT Stop Time 1142    PT Time Calculation (min) 42 min    Equipment Utilized During Treatment Gait belt    Activity Tolerance Patient tolerated treatment well;No increased pain    Behavior During Therapy [facility] for tasks assessed/performed                      Past Medical History:  Diagnosis Date   Anxiety    Arteriosclerosis of coronary artery 11/16/2011   Overview:  Stent 10/2011 stent rca 2015 with collaterals to lad which is chronically occluded    Benign enlargement of prostate    Benign essential HTN 06/11/2014   Benign prostatic hypertrophy without urinary obstruction 07/31/2014   Bilateral cataracts 05/16/2013   Overview:  [facility]     Bone spur of foot    Left   BP (high blood pressure) 11/09/2012   Cancer (HCC)    skin (forehead) and bladder   Carotid artery narrowing 02/08/2014   Depression    Detrusor hypertrophy    Diabetes (HCC)    Diabetes mellitus, type 2 (HCC) 12/12/2012   Diverticulosis    Dyspnea    Esophageal reflux    Esophageal reflux    Fothergill's neuralgia 08/07/2012   Overview:  [facility]    Gastritis    GERD (gastroesophageal reflux disease)    Headache    cluster headaches   Healed myocardial infarct 11/09/2012   Hearing loss in left ear    Heart disease    Hematuria    Hemorrhoids    History of hiatal hernia 12/14/2017   small    Hypercholesteremia    Lesion of bladder    Myocardial infarct (HCC)    Presence of stent in coronary artery 11/09/2012   Rectal bleeding     Trigeminal neuralgia    Trigeminal neuralgia    Valvular heart disease    Vitamin D deficiency    Past Surgical History:  Procedure Laterality Date   APPENDECTOMY     BOTOX INJECTION N/A 12/21/2017   Procedure: Bladder BOTOX INJECTION;  Surgeon: Vanna Scotland, MD;  Location: [facility] ORS;  Service: Urology;  Laterality: N/A;   BOTOX INJECTION N/A 09/11/2018   Procedure: Bladder BOTOX INJECTION;  Surgeon: Vanna Scotland, MD;  Location: [facility] ORS;  Service: Urology;  Laterality: N/A;   CARDIAC CATHETERIZATION     CARDIAC CATHETERIZATION N/A 01/22/2015   Procedure: Left Heart Cath;  Surgeon: Lamar Blinks, MD;  Location: [facility] INVASIVE CV LAB;  Service: Cardiovascular;  Laterality: N/A;   CARDIAC CATHETERIZATION N/A 01/22/2015   Procedure: Coronary Stent Intervention;  Surgeon: Marcina Millard, MD;  Location: [facility] INVASIVE CV LAB;  Service: Cardiovascular;  Laterality: N/A;   CATARACT EXTRACTION, BILATERAL     COLONOSCOPY WITH PROPOFOL N/A 12/08/2015   Procedure: COLONOSCOPY WITH PROPOFOL;  Surgeon: Christena Deem, MD;  Location: [facility] ENDOSCOPY;  Service: Endoscopy;  Laterality: N/A;   COLONOSCOPY WITH PROPOFOL N/A  12/09/2015   Procedure: COLONOSCOPY WITH PROPOFOL;  Surgeon: Christena Deem, MD;  Location: [facility] ENDOSCOPY;  Service: Endoscopy;  Laterality: N/A;   CORONARY ANGIOPLASTY     5 stents   CORONARY STENT PLACEMENT  2015   x5   CYSTOSCOPY N/A 09/11/2018   Procedure: CYSTOSCOPY;  Surgeon: Vanna Scotland, MD;  Location: [facility] ORS;  Service: Urology;  Laterality: N/A;   CYSTOSCOPY WITH BIOPSY N/A 12/21/2017   Procedure: CYSTOSCOPY WITH BIOPSY;  Surgeon: Vanna Scotland, MD;  Location: [facility] ORS;  Service: Urology;  Laterality: N/A;   ESOPHAGOGASTRODUODENOSCOPY (EGD) WITH PROPOFOL N/A 12/08/2015   Procedure: ESOPHAGOGASTRODUODENOSCOPY (EGD) WITH PROPOFOL;  Surgeon: Christena Deem, MD;  Location: [facility] ENDOSCOPY;  Service: Endoscopy;  Laterality: N/A;    ESOPHAGOGASTRODUODENOSCOPY (EGD) WITH PROPOFOL N/A 12/13/2017   Procedure: ESOPHAGOGASTRODUODENOSCOPY (EGD) WITH PROPOFOL;  Surgeon: Christena Deem, MD;  Location: [facility] ENDOSCOPY;  Service: Endoscopy;  Laterality: N/A;   EYE SURGERY     HERNIA REPAIR     kidney tumor remove     TRANSURETHRAL RESECTION OF BLADDER TUMOR WITH GYRUS (TURBT-GYRUS)  12/2013   UMBILICAL HERNIA REPAIR     urethral meatotomy     Patient Active Problem List   Diagnosis Date Noted   Class 2 obesity due to excess calories with body mass index (BMI) of 36.0 to 36.9 in adult 04/11/2020   Swelling of limb 03/28/2020   Lymphedema 03/28/2020   Trigeminal neuralgia 12/25/2019   Lower limb ulcer, calf, left, limited to breakdown of skin (HCC) 12/25/2019   OSA (obstructive sleep apnea) 01/23/2019   Acute systolic CHF (congestive heart failure) (HCC) 12/12/2018   Bruising 07/10/2018   Diet-controlled type 2 diabetes mellitus (HCC) 03/24/2018   Microalbuminuria 03/24/2018   Dizziness 11/17/2017   Simple chronic bronchitis (HCC) 09/22/2017   SOBOE (shortness of breath on exertion) 02/18/2017   Chronic midline low back pain without sciatica 12/29/2016   Periodic limb movement disorder 12/29/2016   Benign essential tremor 06/22/2016   Pure hypercholesterolemia 06/20/2015   Erectile dysfunction due to arterial insufficiency 06/16/2015   Unstable angina (HCC) 01/22/2015   Abdominal aortic 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 rupture (HCC) 12/31/2014   Benign prostatic hypertrophy without urinary obstruction 07/31/2014   Enlarged prostate 07/31/2014   Benign essential HTN 06/11/2014   Carotid artery narrowing 02/08/2014   Carotid artery obstruction 02/08/2014   Bilateral carotid artery stenosis 02/08/2014   Chest pain 08/20/2013   Bilateral cataracts 05/16/2013   Cataract 05/16/2013   Clinical depression 12/12/2012   Diabetes mellitus, type 2 (HCC) 12/12/2012   Combined fat and carbohydrate induced hyperlipemia 12/12/2012    Major depressive disorder, single episode, unspecified 12/12/2012   Acid reflux 11/09/2012   Presence of stent in coronary artery 11/09/2012   BP (high blood pressure) 11/09/2012   Healed myocardial infarct 11/09/2012   Gastro-esophageal reflux disease without esophagitis 11/09/2012   History of cardiovascular surgery 11/09/2012   Fothergill's neuralgia 08/07/2012   Swelling of testicle 04/06/2012   Disorder of male genital organ 04/06/2012   Swelling of the testicles 04/06/2012   Arteriosclerosis of coronary artery 11/16/2011   CAD in native artery 11/16/2011   Fatigue 06/04/2011   Avitaminosis D 11/30/2010    ONSET DATE: 03/17/2022- Worsening symptoms  REFERRING DIAG:  G20.A1 (ICD-10-CM) - Parkinsons  R26.2 (ICD-10-CM) - Difficulty walking    THERAPY DIAG:  Difficulty in walking, not elsewhere classified  Muscle weakness (generalized)  Abnormality of gait and mobility  Unsteadiness on feet  Other abnormalities of gait and  mobility  Other lack of coordination  Rationale for Evaluation and Treatment: Rehabilitation  SUBJECTIVE:                                                                                                                                                                                             SUBJECTIVE STATEMENT:  Patient doing okay this morning but states has been having some sciatic pain    Patient reports 0/10  Pt accompanied by: significant other  PERTINENT HISTORY: Patient is a 77 year old male with referral to outpatient PT for Parkinsons and difficulty walking. He was recently seen by Neurology worsening symptoms of parkinson- difficulty with walking. He has past medical history of Arthritis, Bladder cancer, and Trigeminal Neuralgia.  PAIN:  Are you having pain? NO  PRECAUTIONS: Fall  WEIGHT BEARING RESTRICTIONS: No  FALLS: Has patient fallen in last 6 months? Yes. Number of falls 1  LIVING ENVIRONMENT: Lives with: lives with  their spouse Lives in: House/apartment Stairs: Yes: External: 2 steps; has grab bar Has following equipment at home: Dan Humphreys - 2 wheeled, Environmental consultant - 4 wheeled, and Grab bars  PLOF: Independent with household mobility with device, Independent with community mobility with device, Independent with transfers, and Requires assistive device for independence  PATIENT GOALS: Get to where I can walk better again and get my legs stronger.   OBJECTIVE:    TODAY'S TREATMENT:                                                                                                                              DATE: 11/17/2022     Therex:   INSTRUCTED IN [employer] gym exercises today:  Knee flex L2 x 12 reps x 2 sets; L3 2 x 10 reps  (increased physical assistance to get on and off the equipment)  Knee ext L2  x 10 reps x 2 sets (patient reported as fatiguing)   Leg press L2 3 sets of 10 reps Calf press L2 3 sets of 10 reps  Gait in hallway using upright 4WW- CGA - focusing on  picking up feet and not shuffling.   Pt required occasional rest breaks due fatigue, PT was quick to ask when pt appeared to be fatiguing in order to prevent excessive fatigue.        PATIENT EDUCATION: Education details: Plan of care; purpose of functional outcome measures.  Person educated: Patient Education method: Explanation, Demonstration, Tactile cues, Verbal cues, and Handouts Education comprehension: verbalized understanding, returned demonstration, verbal cues required, tactile cues required, and needs further education  HOME EXERCISE PROGRAM: Access Code: [email or web address] URL: [email or web address] Date: 08/11/2022 Prepared by: Maureen Ralphs  Exercises - Seated Lumbar Flexion Stretch  - 7 x weekly - 3 sets - 30 sec hold - Seated Thoracic Flexion and Rotation with Arms Crossed  - 1 x daily - 7 x weekly - 3 sets - 30 sec hold - Seated Figure 4 Piriformis Stretch  - 1 x daily - 7 x weekly - 3 sets - 10  reps  GOALS: Goals reviewed with patient? Yes  SHORT TERM GOALS: Target date: 09/20/2022  Pt will be independent with a comprehensive HEP in order to improve strength and balance in order to decrease fall risk and improve function at home and work.  Baseline: EVAL: Patient admits not performing much exercises - not moving around well. 10/25/2022-Patient reports not as compliant with previous HEP as instructed- Verbally reviewed Low back, cervical, thoracic stretching and importance of daily walking. 11/03/2022= Patient reports independent with cervical Stretches and some walking at home Goal status: MET   LONG TERM GOALS: Target date: 01/26/2023  Pt will improve FOTO to target score of 45 % to display perceived improvements in ability to complete ADL's.  Baseline: EVAL: 42: 11/03/2022= 55 Goal status: MET  2.  Pt will decrease 5TSTS by at least 5 seconds in order to demonstrate clinically significant improvement in LE strength.  Baseline: EVAL = 17.39 sec without UE support; 10/25/2022= 12.91 sec Goal status: PROGRESSING  3. Pt will increase by at least 0.15 m/s in order to demonstrate clinically significant improvement in community ambulation   Baseline: EVAL= 0.67 m/s; 10/25/2022= 0.77 m/s Goal status: PROGRESSING  4.  Pt will improve BERG by at least 3 points in order to demonstrate clinically significant improvement in balance.   Baseline: EVAL: to be assessed next visit: 08/23/2022= 36/56; 11/03/2022= 40/56 Goal status: MET  5.  Pt will decrease TUG to below 14 seconds/decrease in order to demonstrate decreased fall risk. Baseline: EVAL: 22.00 sec with upright 4WW; 10/25/2022= 15.98 sec with 4WW Goal status: PROGRESSING  6.  Pt will increase by at least 58m (177ft) in order to demonstrate clinically significant improvement in cardiopulmonary endurance and community ambulation  Baseline: EVAL: 3:45 min (450 feet with upright 4WW) 10/25/2022= 830 feet with 4WW Goal status:  MET  7.   Patient will report returning to walking in to community places using most appropriate assistive device vs current use of scooter for improved abilities with mobility during community outings Baseline: EVAL: Current using scooter for many social/community outings; 10/25/2022- Paitent continues to scooter for long distance community distances- due to some fatigue or SOB.  Goal status: ONGOING  8.  Pt will increase > 1000 feet in order to demonstrate clinically significant improvement in cardiopulmonary endurance and community ambulation   Baseline: 10/25/2022= 830 feet with 4WW  Goal status= NEW  9.  Pt will improve BERG by at least 3 points in order to demonstrate clinically significant improvement in balance.   Baseline: 11/03/2022= 40/56 Goal  status: NEW  ASSESSMENT:  CLINICAL IMPRESSION: Patient responded well to introduction into gym based exercises- Started with LE exercises and patient challenged getting on/off equipment- requiring PT to physically assist and adjust machines/weights He will benefit from further training in gym setting to improve his ability to negotiate around equipment. Pt will continue to benefit from skilled PT services to address deficits and impairment identified in evaluation in order to maximize independence and safety in basic mobility required for performance of ADL, IADL, and leisure.    OBJECTIVE IMPAIRMENTS: Abnormal gait, cardiopulmonary status limiting activity, decreased activity tolerance, decreased balance, decreased coordination, decreased endurance, decreased mobility, difficulty walking, decreased ROM, decreased strength, hypomobility, impaired flexibility, impaired sensation, impaired UE functional use, postural dysfunction, obesity, and pain.   ACTIVITY LIMITATIONS: carrying, lifting, bending, standing, squatting, stairs, transfers, continence, and reach over head  PARTICIPATION LIMITATIONS: meal prep, cleaning, laundry, driving,  shopping, community activity, and yard work  PERSONAL FACTORS: Age and 3+ comorbidities: HTN, Arthritis, Low back pain, Trigeminal Neuralgia  are also affecting patient's functional outcome.   REHAB POTENTIAL: Good  CLINICAL DECISION MAKING: Evolving/moderate complexity  EVALUATION COMPLEXITY: Moderate  PLAN:  PT FREQUENCY: 1-2x/week  PT DURATION: 12 weeks  PLANNED INTERVENTIONS: Therapeutic exercises, Therapeutic activity, Neuromuscular re-education, Balance training, Gait training, Patient/Family education, Self Care, Joint mobilization, Stair training, Vestibular training, Canalith repositioning, DME instructions, Dry Needling, Electrical stimulation, Spinal manipulation, Spinal mobilization, Cryotherapy, Moist heat, Manual therapy, and Re-evaluation  PLAN FOR NEXT SESSION: Progress therex, bed mobility, and balance training. Continue with  training in Well zone for UE/LE strengthening and cardiovascular training.    Lenda Kelp, PT 11/17/2022, 5:29 PM  5:29 PM, 11/17/22  Physical Therapist - [facility]  Outpatient Physical Therapy- Main Campus [phone]

## 2022-11-19 ENCOUNTER — Ambulatory Visit: Payer: Medicare HMO

## 2022-11-19 DIAGNOSIS — M6281 Muscle weakness (generalized): Secondary | ICD-10-CM

## 2022-11-19 DIAGNOSIS — R262 Difficulty in walking, not elsewhere classified: Secondary | ICD-10-CM

## 2022-11-19 DIAGNOSIS — R278 Other lack of coordination: Secondary | ICD-10-CM | POA: Diagnosis not present

## 2022-11-19 DIAGNOSIS — R2681 Unsteadiness on feet: Secondary | ICD-10-CM | POA: Diagnosis not present

## 2022-11-19 DIAGNOSIS — G8929 Other chronic pain: Secondary | ICD-10-CM | POA: Diagnosis not present

## 2022-11-19 DIAGNOSIS — R269 Unspecified abnormalities of gait and mobility: Secondary | ICD-10-CM | POA: Diagnosis not present

## 2022-11-19 DIAGNOSIS — R2689 Other abnormalities of gait and mobility: Secondary | ICD-10-CM | POA: Diagnosis not present

## 2022-11-19 DIAGNOSIS — M47816 Spondylosis without myelopathy or radiculopathy, lumbar region: Secondary | ICD-10-CM | POA: Insufficient documentation

## 2022-11-19 DIAGNOSIS — M25511 Pain in right shoulder: Secondary | ICD-10-CM | POA: Insufficient documentation

## 2022-11-19 DIAGNOSIS — M545 Low back pain, unspecified: Secondary | ICD-10-CM | POA: Diagnosis not present

## 2022-11-19 DIAGNOSIS — M533 Sacrococcygeal disorders, not elsewhere classified: Secondary | ICD-10-CM | POA: Insufficient documentation

## 2022-11-19 DIAGNOSIS — M47896 Other spondylosis, lumbar region: Secondary | ICD-10-CM | POA: Diagnosis not present

## 2022-11-19 NOTE — Therapy (Signed)
OUTPATIENT PHYSICAL THERAPY NEURO TREATMENT  Patient Name: [name] MRN: [id number] DOB:[birth year], 77 y.o., male Today's Date: 11/19/2022   PCP: Ardyth Man, PA- C REFERRING PROVIDER: Morene Crocker, MD  END OF SESSION:  PT End of Session - 11/19/22 1026     Visit Number 27    Number of Visits 46    Date for PT Re-Evaluation 01/26/23    Authorization Type [employer]    Progress Note Due on Visit 30    PT Start Time 1015    PT Stop Time 1050    PT Time Calculation (min) 35 min    Equipment Utilized During Treatment Gait belt    Activity Tolerance Patient limited by pain    Behavior During Therapy [facility] for tasks assessed/performed                       Past Medical History:  Diagnosis Date   Anxiety    Arteriosclerosis of coronary artery 11/16/2011   Overview:  Stent 10/2011 stent rca 2015 with collaterals to lad which is chronically occluded    Benign enlargement of prostate    Benign essential HTN 06/11/2014   Benign prostatic hypertrophy without urinary obstruction 07/31/2014   Bilateral cataracts 05/16/2013   Overview:  [facility]     Bone spur of foot    Left   BP (high blood pressure) 11/09/2012   Cancer (HCC)    skin (forehead) and bladder   Carotid artery narrowing 02/08/2014   Depression    Detrusor hypertrophy    Diabetes (HCC)    Diabetes mellitus, type 2 (HCC) 12/12/2012   Diverticulosis    Dyspnea    Esophageal reflux    Esophageal reflux    Fothergill's neuralgia 08/07/2012   Overview:  [facility]    Gastritis    GERD (gastroesophageal reflux disease)    Headache    cluster headaches   Healed myocardial infarct 11/09/2012   Hearing loss in left ear    Heart disease    Hematuria    Hemorrhoids    History of hiatal hernia 12/14/2017   small    Hypercholesteremia    Lesion of bladder    Myocardial infarct (HCC)    Presence of stent in coronary artery 11/09/2012   Rectal bleeding    Trigeminal neuralgia     Trigeminal neuralgia    Valvular heart disease    Vitamin D deficiency    Past Surgical History:  Procedure Laterality Date   APPENDECTOMY     BOTOX INJECTION N/A 12/21/2017   Procedure: Bladder BOTOX INJECTION;  Surgeon: Vanna Scotland, MD;  Location: [facility] ORS;  Service: Urology;  Laterality: N/A;   BOTOX INJECTION N/A 09/11/2018   Procedure: Bladder BOTOX INJECTION;  Surgeon: Vanna Scotland, MD;  Location: [facility] ORS;  Service: Urology;  Laterality: N/A;   CARDIAC CATHETERIZATION     CARDIAC CATHETERIZATION N/A 01/22/2015   Procedure: Left Heart Cath;  Surgeon: Lamar Blinks, MD;  Location: [facility] INVASIVE CV LAB;  Service: Cardiovascular;  Laterality: N/A;   CARDIAC CATHETERIZATION N/A 01/22/2015   Procedure: Coronary Stent Intervention;  Surgeon: Marcina Millard, MD;  Location: [facility] INVASIVE CV LAB;  Service: Cardiovascular;  Laterality: N/A;   CATARACT EXTRACTION, BILATERAL     COLONOSCOPY WITH PROPOFOL N/A 12/08/2015   Procedure: COLONOSCOPY WITH PROPOFOL;  Surgeon: Christena Deem, MD;  Location: [facility] ENDOSCOPY;  Service: Endoscopy;  Laterality: N/A;   COLONOSCOPY WITH PROPOFOL N/A 12/09/2015  Procedure: COLONOSCOPY WITH PROPOFOL;  Surgeon: Christena Deem, MD;  Location: [facility] ENDOSCOPY;  Service: Endoscopy;  Laterality: N/A;   CORONARY ANGIOPLASTY     5 stents   CORONARY STENT PLACEMENT  2015   x5   CYSTOSCOPY N/A 09/11/2018   Procedure: CYSTOSCOPY;  Surgeon: Vanna Scotland, MD;  Location: [facility] ORS;  Service: Urology;  Laterality: N/A;   CYSTOSCOPY WITH BIOPSY N/A 12/21/2017   Procedure: CYSTOSCOPY WITH BIOPSY;  Surgeon: Vanna Scotland, MD;  Location: [facility] ORS;  Service: Urology;  Laterality: N/A;   ESOPHAGOGASTRODUODENOSCOPY (EGD) WITH PROPOFOL N/A 12/08/2015   Procedure: ESOPHAGOGASTRODUODENOSCOPY (EGD) WITH PROPOFOL;  Surgeon: Christena Deem, MD;  Location: [facility] ENDOSCOPY;  Service: Endoscopy;  Laterality: N/A;   ESOPHAGOGASTRODUODENOSCOPY (EGD) WITH PROPOFOL  N/A 12/13/2017   Procedure: ESOPHAGOGASTRODUODENOSCOPY (EGD) WITH PROPOFOL;  Surgeon: Christena Deem, MD;  Location: [facility] ENDOSCOPY;  Service: Endoscopy;  Laterality: N/A;   EYE SURGERY     HERNIA REPAIR     kidney tumor remove     TRANSURETHRAL RESECTION OF BLADDER TUMOR WITH GYRUS (TURBT-GYRUS)  12/2013   UMBILICAL HERNIA REPAIR     urethral meatotomy     Patient Active Problem List   Diagnosis Date Noted   Class 2 obesity due to excess calories with body mass index (BMI) of 36.0 to 36.9 in adult 04/11/2020   Swelling of limb 03/28/2020   Lymphedema 03/28/2020   Trigeminal neuralgia 12/25/2019   Lower limb ulcer, calf, left, limited to breakdown of skin (HCC) 12/25/2019   OSA (obstructive sleep apnea) 01/23/2019   Acute systolic CHF (congestive heart failure) (HCC) 12/12/2018   Bruising 07/10/2018   Diet-controlled type 2 diabetes mellitus (HCC) 03/24/2018   Microalbuminuria 03/24/2018   Dizziness 11/17/2017   Simple chronic bronchitis (HCC) 09/22/2017   SOBOE (shortness of breath on exertion) 02/18/2017   Chronic midline low back pain without sciatica 12/29/2016   Periodic limb movement disorder 12/29/2016   Benign essential tremor 06/22/2016   Pure hypercholesterolemia 06/20/2015   Erectile dysfunction due to arterial insufficiency 06/16/2015   Unstable angina (HCC) 01/22/2015   Abdominal aortic 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 rupture (HCC) 12/31/2014   Benign prostatic hypertrophy without urinary obstruction 07/31/2014   Enlarged prostate 07/31/2014   Benign essential HTN 06/11/2014   Carotid artery narrowing 02/08/2014   Carotid artery obstruction 02/08/2014   Bilateral carotid artery stenosis 02/08/2014   Chest pain 08/20/2013   Bilateral cataracts 05/16/2013   Cataract 05/16/2013   Clinical depression 12/12/2012   Diabetes mellitus, type 2 (HCC) 12/12/2012   Combined fat and carbohydrate induced hyperlipemia 12/12/2012   Major depressive disorder, single episode,  unspecified 12/12/2012   Acid reflux 11/09/2012   Presence of stent in coronary artery 11/09/2012   BP (high blood pressure) 11/09/2012   Healed myocardial infarct 11/09/2012   Gastro-esophageal reflux disease without esophagitis 11/09/2012   History of cardiovascular surgery 11/09/2012   Fothergill's neuralgia 08/07/2012   Swelling of testicle 04/06/2012   Disorder of male genital organ 04/06/2012   Swelling of the testicles 04/06/2012   Arteriosclerosis of coronary artery 11/16/2011   CAD in native artery 11/16/2011   Fatigue 06/04/2011   Avitaminosis D 11/30/2010    ONSET DATE: 03/17/2022- Worsening symptoms  REFERRING DIAG:  G20.A1 (ICD-10-CM) - Parkinsons  R26.2 (ICD-10-CM) - Difficulty walking    THERAPY DIAG:  Difficulty in walking, not elsewhere classified  Muscle weakness (generalized)  Abnormality of gait and mobility  Unsteadiness on feet  Other abnormalities of gait and mobility  Rationale  for Evaluation and Treatment: Rehabilitation  SUBJECTIVE:                                                                                                                                                                                             SUBJECTIVE STATEMENT:  Patient reports some right hip soreness after last session.     Patient reports 0/10  Pt accompanied by: significant other  PERTINENT HISTORY: Patient is a 77 year old male with referral to outpatient PT for Parkinsons and difficulty walking. He was recently seen by Neurology worsening symptoms of parkinson- difficulty with walking. He has past medical history of Arthritis, Bladder cancer, and Trigeminal Neuralgia.  PAIN:  Are you having pain? NO  PRECAUTIONS: Fall  WEIGHT BEARING RESTRICTIONS: No  FALLS: Has patient fallen in last 6 months? Yes. Number of falls 1  LIVING ENVIRONMENT: Lives with: lives with their spouse Lives in: House/apartment Stairs: Yes: External: 2 steps; has grab bar Has  following equipment at home: Dan Humphreys - 2 wheeled, Environmental consultant - 4 wheeled, and Grab bars  PLOF: Independent with household mobility with device, Independent with community mobility with device, Independent with transfers, and Requires assistive device for independence  PATIENT GOALS: Get to where I can walk better again and get my legs stronger.   OBJECTIVE:    TODAY'S TREATMENT:                                                                                                                              DATE: 11/19/22    Therex:   Seated hip march alt LE x 20 reps (some mild increase pain -only on last 3)  Seated knee ext alt LE x 20 reps   Gait in hallway using upright 4WW and 4# AW for 300 feet- CGA - focusing on picking up feet and not shuffling. Patient reported increased right hip soreness so ceased activity.  PROM in supine- Right LE distraction (+) pain, Patient able to tolerate flex based exercises but increased right hip pain with any extension- tight hip flexors. Patient unable to  relax and no improvement with gentle stretching.           PATIENT EDUCATION: Education details: Plan of care; purpose of functional outcome measures.  Person educated: Patient Education method: Explanation, Demonstration, Tactile cues, Verbal cues, and Handouts Education comprehension: verbalized understanding, returned demonstration, verbal cues required, tactile cues required, and needs further education  HOME EXERCISE PROGRAM: Access Code: [email or web address] URL: [email or web address] Date: 08/11/2022 Prepared by: Maureen Ralphs  Exercises - Seated Lumbar Flexion Stretch  - 7 x weekly - 3 sets - 30 sec hold - Seated Thoracic Flexion and Rotation with Arms Crossed  - 1 x daily - 7 x weekly - 3 sets - 30 sec hold - Seated Figure 4 Piriformis Stretch  - 1 x daily - 7 x weekly - 3 sets - 10 reps  GOALS: Goals reviewed with patient? Yes  SHORT TERM GOALS: Target date:  09/20/2022  Pt will be independent with a comprehensive HEP in order to improve strength and balance in order to decrease fall risk and improve function at home and work.  Baseline: EVAL: Patient admits not performing much exercises - not moving around well. 10/25/2022-Patient reports not as compliant with previous HEP as instructed- Verbally reviewed Low back, cervical, thoracic stretching and importance of daily walking. 11/03/2022= Patient reports independent with cervical Stretches and some walking at home Goal status: MET   LONG TERM GOALS: Target date: 01/26/2023  Pt will improve FOTO to target score of 45 % to display perceived improvements in ability to complete ADL's.  Baseline: EVAL: 42: 11/03/2022= 55 Goal status: MET  2.  Pt will decrease 5TSTS by at least 5 seconds in order to demonstrate clinically significant improvement in LE strength.  Baseline: EVAL = 17.39 sec without UE support; 10/25/2022= 12.91 sec Goal status: PROGRESSING  3. Pt will increase by at least 0.15 m/s in order to demonstrate clinically significant improvement in community ambulation   Baseline: EVAL= 0.67 m/s; 10/25/2022= 0.77 m/s Goal status: PROGRESSING  4.  Pt will improve BERG by at least 3 points in order to demonstrate clinically significant improvement in balance.   Baseline: EVAL: to be assessed next visit: 08/23/2022= 36/56; 11/03/2022= 40/56 Goal status: MET  5.  Pt will decrease TUG to below 14 seconds/decrease in order to demonstrate decreased fall risk. Baseline: EVAL: 22.00 sec with upright 4WW; 10/25/2022= 15.98 sec with 4WW Goal status: PROGRESSING  6.  Pt will increase by at least 6m (175ft) in order to demonstrate clinically significant improvement in cardiopulmonary endurance and community ambulation  Baseline: EVAL: 3:45 min (450 feet with upright 4WW) 10/25/2022= 830 feet with 4WW Goal status: MET  7.   Patient will report returning to walking in to community places using most  appropriate assistive device vs current use of scooter for improved abilities with mobility during community outings Baseline: EVAL: Current using scooter for many social/community outings; 10/25/2022- Paitent continues to scooter for long distance community distances- due to some fatigue or SOB.  Goal status: ONGOING  8.  Pt will increase > 1000 feet in order to demonstrate clinically significant improvement in cardiopulmonary endurance and community ambulation   Baseline: 10/25/2022= 830 feet with 4WW  Goal status= NEW  9.  Pt will improve BERG by at least 3 points in order to demonstrate clinically significant improvement in balance.   Baseline: 11/03/2022= 40/56 Goal status: NEW  ASSESSMENT:  CLINICAL IMPRESSION: Patient presents with increased overall pain in right hip and unable to relax with gentle  stretching. More difficulty with walking. Treatment was terminated due to patient uncomfortable with all mobility. Advised him that if no improvement by early next week to seek medical attention and he verbalized understanding.  Pt will continue to benefit from skilled PT services to address deficits and impairment identified in evaluation in order to maximize independence and safety in basic mobility required for performance of ADL, IADL, and leisure.    OBJECTIVE IMPAIRMENTS: Abnormal gait, cardiopulmonary status limiting activity, decreased activity tolerance, decreased balance, decreased coordination, decreased endurance, decreased mobility, difficulty walking, decreased ROM, decreased strength, hypomobility, impaired flexibility, impaired sensation, impaired UE functional use, postural dysfunction, obesity, and pain.   ACTIVITY LIMITATIONS: carrying, lifting, bending, standing, squatting, stairs, transfers, continence, and reach over head  PARTICIPATION LIMITATIONS: meal prep, cleaning, laundry, driving, shopping, community activity, and yard work  PERSONAL FACTORS: Age and 3+  comorbidities: HTN, Arthritis, Low back pain, Trigeminal Neuralgia  are also affecting patient's functional outcome.   REHAB POTENTIAL: Good  CLINICAL DECISION MAKING: Evolving/moderate complexity  EVALUATION COMPLEXITY: Moderate  PLAN:  PT FREQUENCY: 1-2x/week  PT DURATION: 12 weeks  PLANNED INTERVENTIONS: Therapeutic exercises, Therapeutic activity, Neuromuscular re-education, Balance training, Gait training, Patient/Family education, Self Care, Joint mobilization, Stair training, Vestibular training, Canalith repositioning, DME instructions, Dry Needling, Electrical stimulation, Spinal manipulation, Spinal mobilization, Cryotherapy, Moist heat, Manual therapy, and Re-evaluation  PLAN FOR NEXT SESSION: Progress therex, bed mobility, and balance training. Continue with  training in Well zone for UE/LE strengthening and cardiovascular training.    Lenda Kelp, PT 11/19/2022, 10:55 AM  10:55 AM, 11/19/22  Physical Therapist - [facility]  Outpatient Physical Therapy- Main Campus [phone]

## 2022-11-22 ENCOUNTER — Ambulatory Visit: Payer: Medicare HMO

## 2022-11-22 DIAGNOSIS — R278 Other lack of coordination: Secondary | ICD-10-CM

## 2022-11-22 DIAGNOSIS — M6281 Muscle weakness (generalized): Secondary | ICD-10-CM | POA: Diagnosis not present

## 2022-11-22 DIAGNOSIS — M545 Low back pain, unspecified: Secondary | ICD-10-CM | POA: Diagnosis not present

## 2022-11-22 DIAGNOSIS — R262 Difficulty in walking, not elsewhere classified: Secondary | ICD-10-CM | POA: Diagnosis not present

## 2022-11-22 DIAGNOSIS — R269 Unspecified abnormalities of gait and mobility: Secondary | ICD-10-CM | POA: Diagnosis not present

## 2022-11-22 DIAGNOSIS — R2689 Other abnormalities of gait and mobility: Secondary | ICD-10-CM

## 2022-11-22 DIAGNOSIS — R2681 Unsteadiness on feet: Secondary | ICD-10-CM

## 2022-11-22 DIAGNOSIS — G8929 Other chronic pain: Secondary | ICD-10-CM | POA: Diagnosis not present

## 2022-11-22 NOTE — Therapy (Signed)
OUTPATIENT PHYSICAL THERAPY NEURO TREATMENT  Patient Name: [name] MRN: [id number] DOB:[birth year], 77 y.o., male Today's Date: 11/22/2022   PCP: Ardyth Man, PA- C REFERRING PROVIDER: Morene Crocker, MD  END OF SESSION:  PT End of Session - 11/22/22 1103     Visit Number 28    Number of Visits 46    Date for PT Re-Evaluation 01/26/23    Authorization Type [employer]    Progress Note Due on Visit 30    PT Start Time 1056    PT Stop Time 1144    PT Time Calculation (min) 48 min    Equipment Utilized During Treatment Gait belt    Activity Tolerance Patient limited by pain    Behavior During Therapy [facility] for tasks assessed/performed                        Past Medical History:  Diagnosis Date   Anxiety    Arteriosclerosis of coronary artery 11/16/2011   Overview:  Stent 10/2011 stent rca 2015 with collaterals to lad which is chronically occluded    Benign enlargement of prostate    Benign essential HTN 06/11/2014   Benign prostatic hypertrophy without urinary obstruction 07/31/2014   Bilateral cataracts 05/16/2013   Overview:  [facility]     Bone spur of foot    Left   BP (high blood pressure) 11/09/2012   Cancer (HCC)    skin (forehead) and bladder   Carotid artery narrowing 02/08/2014   Depression    Detrusor hypertrophy    Diabetes (HCC)    Diabetes mellitus, type 2 (HCC) 12/12/2012   Diverticulosis    Dyspnea    Esophageal reflux    Esophageal reflux    Fothergill's neuralgia 08/07/2012   Overview:  [facility]    Gastritis    GERD (gastroesophageal reflux disease)    Headache    cluster headaches   Healed myocardial infarct 11/09/2012   Hearing loss in left ear    Heart disease    Hematuria    Hemorrhoids    History of hiatal hernia 12/14/2017   small    Hypercholesteremia    Lesion of bladder    Myocardial infarct (HCC)    Presence of stent in coronary artery 11/09/2012   Rectal bleeding    Trigeminal neuralgia     Trigeminal neuralgia    Valvular heart disease    Vitamin D deficiency    Past Surgical History:  Procedure Laterality Date   APPENDECTOMY     BOTOX INJECTION N/A 12/21/2017   Procedure: Bladder BOTOX INJECTION;  Surgeon: Vanna Scotland, MD;  Location: [facility] ORS;  Service: Urology;  Laterality: N/A;   BOTOX INJECTION N/A 09/11/2018   Procedure: Bladder BOTOX INJECTION;  Surgeon: Vanna Scotland, MD;  Location: [facility] ORS;  Service: Urology;  Laterality: N/A;   CARDIAC CATHETERIZATION     CARDIAC CATHETERIZATION N/A 01/22/2015   Procedure: Left Heart Cath;  Surgeon: Lamar Blinks, MD;  Location: [facility] INVASIVE CV LAB;  Service: Cardiovascular;  Laterality: N/A;   CARDIAC CATHETERIZATION N/A 01/22/2015   Procedure: Coronary Stent Intervention;  Surgeon: Marcina Millard, MD;  Location: [facility] INVASIVE CV LAB;  Service: Cardiovascular;  Laterality: N/A;   CATARACT EXTRACTION, BILATERAL     COLONOSCOPY WITH PROPOFOL N/A 12/08/2015   Procedure: COLONOSCOPY WITH PROPOFOL;  Surgeon: Christena Deem, MD;  Location: [facility] ENDOSCOPY;  Service: Endoscopy;  Laterality: N/A;   COLONOSCOPY WITH PROPOFOL N/A  12/09/2015   Procedure: COLONOSCOPY WITH PROPOFOL;  Surgeon: Christena Deem, MD;  [facility] END[facility];  Service: Endoscopy;  Laterality: N/A;   CORONARY ANGIOPLASTY     5 stents   CORONARY STENT PLACEMENT  2015   x5   CYSTOSCOPY N/A 09/11/2018   Procedure: CYSTOSCOPY;  Surgeon: Vanna Scotland, MD;  Location: [facility] ORS;  Service: Urology;  Laterality: N/A;   CYSTOSCOPY WITH BIOPSY N/A 12/21/2017   Procedure: CYSTOSCOPY WITH BIOPSY;  Surgeon: Vanna Scotland, MD;  Location: [facility] ORS;  Service: Urology;  Laterality: N/A;   ESOPHAGOGASTRODUODENOSCOPY (EGD) WITH PROPOFOL N/A 12/08/2015   Procedure: ESOPHAGOGASTRODUODENOSCOPY (EGD) WITH PROPOFOL;  Surgeon: Christena Deem, MD;  Location: [facility] ENDOSCOPY;  Service: Endoscopy;  Laterality: N/A;   ESOPHAGOGASTRODUODENOSCOPY (EGD) WITH PROPOFOL  N/A 12/13/2017   Procedure: ESOPHAGOGASTRODUODENOSCOPY (EGD) WITH PROPOFOL;  Surgeon: Christena Deem, MD;  Location: [facility] ENDOSCOPY;  Service: Endoscopy;  Laterality: N/A;   EYE SURGERY     HERNIA REPAIR     kidney tumor remove     TRANSURETHRAL RESECTION OF BLADDER TUMOR WITH GYRUS (TURBT-GYRUS)  12/2013   UMBILICAL HERNIA REPAIR     urethral meatotomy     Patient Active Problem List   Diagnosis Date Noted   Class 2 obesity due to excess calories with body mass index (BMI) of 36.0 to 36.9 in adult 04/11/2020   Swelling of limb 03/28/2020   Lymphedema 03/28/2020   Trigeminal neuralgia 12/25/2019   Lower limb ulcer, calf, left, limited to breakdown of skin (HCC) 12/25/2019   OSA (obstructive sleep apnea) 01/23/2019   Acute systolic CHF (congestive heart failure) (HCC) 12/12/2018   Bruising 07/10/2018   Diet-controlled type 2 diabetes mellitus (HCC) 03/24/2018   Microalbuminuria 03/24/2018   Dizziness 11/17/2017   Simple chronic bronchitis (HCC) 09/22/2017   SOBOE (shortness of breath on exertion) 02/18/2017   Chronic midline low back pain without sciatica 12/29/2016   Periodic limb movement disorder 12/29/2016   Benign essential tremor 06/22/2016   Pure hypercholesterolemia 06/20/2015   Erectile dysfunction due to arterial insufficiency 06/16/2015   Unstable angina (HCC) 01/22/2015   Abdominal aortic 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 rupture (HCC) 12/31/2014   Benign prostatic hypertrophy without urinary obstruction 07/31/2014   Enlarged prostate 07/31/2014   Benign essential HTN 06/11/2014   Carotid artery narrowing 02/08/2014   Carotid artery obstruction 02/08/2014   Bilateral carotid artery stenosis 02/08/2014   Chest pain 08/20/2013   Bilateral cataracts 05/16/2013   Cataract 05/16/2013   Clinical depression 12/12/2012   Diabetes mellitus, type 2 (HCC) 12/12/2012   Combined fat and carbohydrate induced hyperlipemia 12/12/2012   Major depressive disorder, single episode,  unspecified 12/12/2012   Acid reflux 11/09/2012   Presence of stent in coronary artery 11/09/2012   BP (high blood pressure) 11/09/2012   Healed myocardial infarct 11/09/2012   Gastro-esophageal reflux disease without esophagitis 11/09/2012   History of cardiovascular surgery 11/09/2012   Fothergill's neuralgia 08/07/2012   Swelling of testicle 04/06/2012   Disorder of male genital organ 04/06/2012   Swelling of the testicles 04/06/2012   Arteriosclerosis of coronary artery 11/16/2011   CAD in native artery 11/16/2011   Fatigue 06/04/2011   Avitaminosis D 11/30/2010    ONSET DATE: 03/17/2022- Worsening symptoms  REFERRING DIAG:  G20.A1 (ICD-10-CM) - Parkinsons  R26.2 (ICD-10-CM) - Difficulty walking    THERAPY DIAG:  Difficulty in walking, not elsewhere classified  Muscle weakness (generalized)  Abnormality of gait and mobility  Unsteadiness on feet  Other abnormalities of gait and  mobility  Other lack of coordination  Chronic bilateral low back pain without sciatica  Rationale for Evaluation and Treatment: Rehabilitation  SUBJECTIVE:                                                                                                                                                                                             SUBJECTIVE STATEMENT:  Patient reports mild improvement in his right sided low back pain. States he took a mm relaxer prior to visit so pain is only around 2/10     Patient reports 0/10  Pt accompanied by: significant other  PERTINENT HISTORY: Patient is a 77 year old male with referral to outpatient PT for Parkinsons and difficulty walking. He was recently seen by Neurology worsening symptoms of parkinson- difficulty with walking. He has past medical history of Arthritis, Bladder cancer, and Trigeminal Neuralgia.  PAIN:  Are you having pain? NO  PRECAUTIONS: Fall  WEIGHT BEARING RESTRICTIONS: No  FALLS: Has patient fallen in last 6 months?  Yes. Number of falls 1  LIVING ENVIRONMENT: Lives with: lives with their spouse Lives in: House/apartment Stairs: Yes: External: 2 steps; has grab bar Has following equipment at home: Dan Humphreys - 2 wheeled, Environmental consultant - 4 wheeled, and Grab bars  PLOF: Independent with household mobility with device, Independent with community mobility with device, Independent with transfers, and Requires assistive device for independence  PATIENT GOALS: Get to where I can walk better again and get my legs stronger.   OBJECTIVE:    TODAY'S TREATMENT:                                                                                                                              DATE: 11/22/22    Therex:   Nustep= L0 BUE/LE at seat 8 - 6 min- 0.18 m/s  Standing hip march alt LE x 20 reps  Standing step tap onto airex pad x 20 reps alt LE without UE support    NMR:  Forward/retro walking  in // bars down and back (counting each step) x 10  Dynamic standing on airex pad with eyes open hold 30 sec x 2 Dynamic standing on airex pad with eyes closed hold 30 sec x 2 (increased sway yet no LOB)  Walking in // bars- stepping over 4 colored therabands - Focusing on taking larger steps.  Standing step tap onto airex pad x 20 reps alt LE without UE support Gait in hallway using upright 4WW and  for 300 feet- CGA - focusing on picking up feet and not shuffling. Dynamic side stepping over colored theraband down and back x 10 reps    TDN Treatment: (Unbilled)  Patient consent: After explanation of TDN Rationale, Procedures, outcomes, and potential side effects, patient verbalized consent to TDN treatment. Region/Dx: right Upper trap pain Muscles Treated: Right UT- 0.3 x 60 x 2 needle sticks along UT region Post treatment pain/response: No pain and any adverse effects noted  Post treatment Instructions: Patient instructed to expect mild to moderate muscle soreness this evening and tomorrow. Patient instructed to  continued prescribed home exercise program. If dry needling over lung field, patient instructed of signs and symptoms of pneumothorax. Patient also educated on signs and symptoms of infection, however unlikely, and to seek immediate medical attention shoulder thy occur. Patient verbalized understanding of these instructions.      PATIENT EDUCATION: Education details: Plan of care; purpose of functional outcome measures.  Person educated: Patient Education method: Explanation, Demonstration, Tactile cues, Verbal cues, and Handouts Education comprehension: verbalized understanding, returned demonstration, verbal cues required, tactile cues required, and needs further education  HOME EXERCISE PROGRAM: Access Code: [email or web address] URL: [email or web address] Date: 08/11/2022 Prepared by: Maureen Ralphs  Exercises - Seated Lumbar Flexion Stretch  - 7 x weekly - 3 sets - 30 sec hold - Seated Thoracic Flexion and Rotation with Arms Crossed  - 1 x daily - 7 x weekly - 3 sets - 30 sec hold - Seated Figure 4 Piriformis Stretch  - 1 x daily - 7 x weekly - 3 sets - 10 reps  GOALS: Goals reviewed with patient? Yes  SHORT TERM GOALS: Target date: 09/20/2022  Pt will be independent with a comprehensive HEP in order to improve strength and balance in order to decrease fall risk and improve function at home and work.  Baseline: EVAL: Patient admits not performing much exercises - not moving around well. 10/25/2022-Patient reports not as compliant with previous HEP as instructed- Verbally reviewed Low back, cervical, thoracic stretching and importance of daily walking. 11/03/2022= Patient reports independent with cervical Stretches and some walking at home Goal status: MET   LONG TERM GOALS: Target date: 01/26/2023  Pt will improve FOTO to target score of 45 % to display perceived improvements in ability to complete ADL's.  Baseline: EVAL: 42: 11/03/2022= 55 Goal status: MET  2.  Pt will  decrease 5TSTS by at least 5 seconds in order to demonstrate clinically significant improvement in LE strength.  Baseline: EVAL = 17.39 sec without UE support; 10/25/2022= 12.91 sec Goal status: PROGRESSING  3. Pt will increase by at least 0.15 m/s in order to demonstrate clinically significant improvement in community ambulation   Baseline: EVAL= 0.67 m/s; 10/25/2022= 0.77 m/s Goal status: PROGRESSING  4.  Pt will improve BERG by at least 3 points in order to demonstrate clinically significant improvement in balance.   Baseline: EVAL: to be assessed next visit: 08/23/2022= 36/56; 11/03/2022= 40/56 Goal status: MET  5.  Pt will decrease TUG to below 14 seconds/decrease in order to demonstrate decreased fall risk.  Baseline: EVAL: 22.00 sec with upright 4WW; 10/25/2022= 15.98 sec with 4WW Goal status: PROGRESSING  6.  Pt will increase by at least 38m (134ft) in order to demonstrate clinically significant improvement in cardiopulmonary endurance and community ambulation  Baseline: EVAL: 3:45 min (450 feet with upright 4WW) 10/25/2022= 830 feet with 4WW Goal status: MET  7.   Patient will report returning to walking in to community places using most appropriate assistive device vs current use of scooter for improved abilities with mobility during community outings Baseline: EVAL: Current using scooter for many social/community outings; 10/25/2022- Paitent continues to scooter for long distance community distances- due to some fatigue or SOB.  Goal status: ONGOING  8.  Pt will increase > 1000 feet in order to demonstrate clinically significant improvement in cardiopulmonary endurance and community ambulation   Baseline: 10/25/2022= 830 feet with 4WW  Goal status= NEW  9.  Pt will improve BERG by at least 3 points in order to demonstrate clinically significant improvement in balance.   Baseline: 11/03/2022= 40/56 Goal status: NEW  ASSESSMENT:  CLINICAL IMPRESSION: Patient  presents  with mild improved pain in right low back/hip but presents with increased tightness in right UT today. He responded well to DN technique with no pain reported. Treatment continued with LE strengthening and dynamic balance but deferred resistance due to still recovering from right sided low back pain.   Pt will continue to benefit from skilled PT services to address deficits and impairment identified in evaluation in order to maximize independence and safety in basic mobility required for performance of ADL, IADL, and leisure.    OBJECTIVE IMPAIRMENTS: Abnormal gait, cardiopulmonary status limiting activity, decreased activity tolerance, decreased balance, decreased coordination, decreased endurance, decreased mobility, difficulty walking, decreased ROM, decreased strength, hypomobility, impaired flexibility, impaired sensation, impaired UE functional use, postural dysfunction, obesity, and pain.   ACTIVITY LIMITATIONS: carrying, lifting, bending, standing, squatting, stairs, transfers, continence, and reach over head  PARTICIPATION LIMITATIONS: meal prep, cleaning, laundry, driving, shopping, community activity, and yard work  PERSONAL FACTORS: Age and 3+ comorbidities: HTN, Arthritis, Low back pain, Trigeminal Neuralgia  are also affecting patient's functional outcome.   REHAB POTENTIAL: Good  CLINICAL DECISION MAKING: Evolving/moderate complexity  EVALUATION COMPLEXITY: Moderate  PLAN:  PT FREQUENCY: 1-2x/week  PT DURATION: 12 weeks  PLANNED INTERVENTIONS: Therapeutic exercises, Therapeutic activity, Neuromuscular re-education, Balance training, Gait training, Patient/Family education, Self Care, Joint mobilization, Stair training, Vestibular training, Canalith repositioning, DME instructions, Dry Needling, Electrical stimulation, Spinal manipulation, Spinal mobilization, Cryotherapy, Moist heat, Manual therapy, and Re-evaluation  PLAN FOR NEXT SESSION: Progress therex, bed mobility, and  balance training. Continue with  training in Well zone for UE/LE strengthening and cardiovascular training.    Lenda Kelp, PT 11/22/2022, 1:02 PM  1:02 PM, 11/22/22  Physical Therapist - [facility]  Outpatient Physical Therapy- Main Campus [phone]

## 2022-11-24 ENCOUNTER — Ambulatory Visit: Payer: Medicare HMO | Admitting: Podiatry

## 2022-11-24 ENCOUNTER — Encounter: Payer: Self-pay | Admitting: Podiatry

## 2022-11-24 ENCOUNTER — Ambulatory Visit: Payer: Medicare HMO

## 2022-11-24 DIAGNOSIS — E1142 Type 2 diabetes mellitus with diabetic polyneuropathy: Secondary | ICD-10-CM | POA: Diagnosis not present

## 2022-11-24 DIAGNOSIS — M79676 Pain in unspecified toe(s): Secondary | ICD-10-CM | POA: Diagnosis not present

## 2022-11-24 DIAGNOSIS — M6281 Muscle weakness (generalized): Secondary | ICD-10-CM | POA: Diagnosis not present

## 2022-11-24 DIAGNOSIS — R262 Difficulty in walking, not elsewhere classified: Secondary | ICD-10-CM

## 2022-11-24 DIAGNOSIS — R2681 Unsteadiness on feet: Secondary | ICD-10-CM

## 2022-11-24 DIAGNOSIS — G20A1 Parkinson's disease without dyskinesia, without mention of fluctuations: Secondary | ICD-10-CM | POA: Diagnosis not present

## 2022-11-24 DIAGNOSIS — D2372 Other benign neoplasm of skin of left lower limb, including hip: Secondary | ICD-10-CM

## 2022-11-24 DIAGNOSIS — G479 Sleep disorder, unspecified: Secondary | ICD-10-CM | POA: Diagnosis not present

## 2022-11-24 DIAGNOSIS — R278 Other lack of coordination: Secondary | ICD-10-CM | POA: Diagnosis not present

## 2022-11-24 DIAGNOSIS — M545 Low back pain, unspecified: Secondary | ICD-10-CM | POA: Diagnosis not present

## 2022-11-24 DIAGNOSIS — R2689 Other abnormalities of gait and mobility: Secondary | ICD-10-CM

## 2022-11-24 DIAGNOSIS — B351 Tinea unguium: Secondary | ICD-10-CM | POA: Diagnosis not present

## 2022-11-24 DIAGNOSIS — R269 Unspecified abnormalities of gait and mobility: Secondary | ICD-10-CM

## 2022-11-24 DIAGNOSIS — G8929 Other chronic pain: Secondary | ICD-10-CM | POA: Diagnosis not present

## 2022-11-24 DIAGNOSIS — D689 Coagulation defect, unspecified: Secondary | ICD-10-CM

## 2022-11-24 DIAGNOSIS — R42 Dizziness and giddiness: Secondary | ICD-10-CM | POA: Diagnosis not present

## 2022-11-24 DIAGNOSIS — G2581 Restless legs syndrome: Secondary | ICD-10-CM | POA: Diagnosis not present

## 2022-11-24 DIAGNOSIS — Z9181 History of falling: Secondary | ICD-10-CM | POA: Diagnosis not present

## 2022-11-24 NOTE — Progress Notes (Signed)
He presents today chief complaint of painful elongated toenails.  Objective: Vital signs are stable alert oriented x 3.  Pulses are palpable.  There is no erythema Dem salines drainage or odor toenails are long thick yellow dystrophic clinically mycotic painful on palpation.  Assessment: Pain left second toe onychomycosis.  Plan: Debridement of toenails 1 through 5 bilateral.

## 2022-11-24 NOTE — Therapy (Signed)
OUTPATIENT PHYSICAL THERAPY NEURO TREATMENT  Patient Name: [name] MRN: [id number] DOB:14-Feb-1946, 77 y.o., male Today's Date: 11/24/2022   PCP: Ardyth Man, PA- C REFERRING PROVIDER: Morene Crocker, MD  END OF SESSION:  PT End of Session - 11/24/22 1109     Visit Number 29    Number of Visits 46    Date for PT Re-Evaluation 01/26/23    Authorization Type [employer]    Progress Note Due on Visit 30    PT Start Time 1100    PT Stop Time 1144    PT Time Calculation (min) 44 min    Equipment Utilized During Treatment Gait belt    Activity Tolerance Patient limited by pain    Behavior During Therapy [facility] for tasks assessed/performed                        Past Medical History:  Diagnosis Date   Anxiety    Arteriosclerosis of coronary artery 11/16/2011   Overview:  Stent 10/2011 stent rca 2015 with collaterals to lad which is chronically occluded    Benign enlargement of prostate    Benign essential HTN 06/11/2014   Benign prostatic hypertrophy without urinary obstruction 07/31/2014   Bilateral cataracts 05/16/2013   Overview:  [facility]     Bone spur of foot    Left   BP (high blood pressure) 11/09/2012   Cancer (HCC)    skin (forehead) and bladder   Carotid artery narrowing 02/08/2014   Depression    Detrusor hypertrophy    Diabetes (HCC)    Diabetes mellitus, type 2 (HCC) 12/12/2012   Diverticulosis    Dyspnea    Esophageal reflux    Esophageal reflux    Fothergill's neuralgia 08/07/2012   Overview:  [facility]    Gastritis    GERD (gastroesophageal reflux disease)    Headache    cluster headaches   Healed myocardial infarct 11/09/2012   Hearing loss in left ear    Heart disease    Hematuria    Hemorrhoids    History of hiatal hernia 12/14/2017   small    Hypercholesteremia    Lesion of bladder    Myocardial infarct (HCC)    Presence of stent in coronary artery 11/09/2012   Rectal bleeding    Trigeminal neuralgia     Trigeminal neuralgia    Valvular heart disease    Vitamin D deficiency    Past Surgical History:  Procedure Laterality Date   APPENDECTOMY     BOTOX INJECTION N/A 12/21/2017   Procedure: Bladder BOTOX INJECTION;  Surgeon: Vanna Scotland, MD;  Location: [facility] ORS;  Service: Urology;  Laterality: N/A;   BOTOX INJECTION N/A 09/11/2018   Procedure: Bladder BOTOX INJECTION;  Surgeon: Vanna Scotland, MD;  Location: [facility] ORS;  Service: Urology;  Laterality: N/A;   CARDIAC CATHETERIZATION     CARDIAC CATHETERIZATION N/A 01/22/2015   Procedure: Left Heart Cath;  Surgeon: Lamar Blinks, MD;  Location: [facility] INVASIVE CV LAB;  Service: Cardiovascular;  Laterality: N/A;   CARDIAC CATHETERIZATION N/A 01/22/2015   Procedure: Coronary Stent Intervention;  Surgeon: Marcina Millard, MD;  Location: [facility] INVASIVE CV LAB;  Service: Cardiovascular;  Laterality: N/A;   CATARACT EXTRACTION, BILATERAL     COLONOSCOPY WITH PROPOFOL N/A 12/08/2015   Procedure: COLONOSCOPY WITH PROPOFOL;  Surgeon: Christena Deem, MD;  Location: [facility] ENDOSCOPY;  Service: Endoscopy;  Laterality: N/A;   COLONOSCOPY WITH PROPOFOL N/A  12/09/2015   Procedure: COLONOSCOPY WITH PROPOFOL;  Surgeon: Christena Deem, MD;  Location: [facility] ENDOSCOPY;  Service: Endoscopy;  Laterality: N/A;   CORONARY ANGIOPLASTY     5 stents   CORONARY STENT PLACEMENT  2015   x5   CYSTOSCOPY N/A 09/11/2018   Procedure: CYSTOSCOPY;  Surgeon: Vanna Scotland, MD;  Location: [facility] ORS;  Service: Urology;  Laterality: N/A;   CYSTOSCOPY WITH BIOPSY N/A 12/21/2017   Procedure: CYSTOSCOPY WITH BIOPSY;  Surgeon: Vanna Scotland, MD;  Location: [facility] ORS;  Service: Urology;  Laterality: N/A;   ESOPHAGOGASTRODUODENOSCOPY (EGD) WITH PROPOFOL N/A 12/08/2015   Procedure: ESOPHAGOGASTRODUODENOSCOPY (EGD) WITH PROPOFOL;  Surgeon: Christena Deem, MD;  Location: [facility] ENDOSCOPY;  Service: Endoscopy;  Laterality: N/A;   ESOPHAGOGASTRODUODENOSCOPY (EGD) WITH PROPOFOL  N/A 12/13/2017   Procedure: ESOPHAGOGASTRODUODENOSCOPY (EGD) WITH PROPOFOL;  Surgeon: Christena Deem, MD;  Location: [facility] ENDOSCOPY;  Service: Endoscopy;  Laterality: N/A;   EYE SURGERY     HERNIA REPAIR     kidney tumor remove     TRANSURETHRAL RESECTION OF BLADDER TUMOR WITH GYRUS (TURBT-GYRUS)  12/2013   UMBILICAL HERNIA REPAIR     urethral meatotomy     Patient Active Problem List   Diagnosis Date Noted   Class 2 obesity due to excess calories with body mass index (BMI) of 36.0 to 36.9 in adult 04/11/2020   Swelling of limb 03/28/2020   Lymphedema 03/28/2020   Trigeminal neuralgia 12/25/2019   Lower limb ulcer, calf, left, limited to breakdown of skin (HCC) 12/25/2019   OSA (obstructive sleep apnea) 01/23/2019   Acute systolic CHF (congestive heart failure) (HCC) 12/12/2018   Bruising 07/10/2018   Diet-controlled type 2 diabetes mellitus (HCC) 03/24/2018   Microalbuminuria 03/24/2018   Dizziness 11/17/2017   Simple chronic bronchitis (HCC) 09/22/2017   SOBOE (shortness of breath on exertion) 02/18/2017   Chronic midline low back pain without sciatica 12/29/2016   Periodic limb movement disorder 12/29/2016   Benign essential tremor 06/22/2016   Pure hypercholesterolemia 06/20/2015   Erectile dysfunction due to arterial insufficiency 06/16/2015   Unstable angina (HCC) 01/22/2015   Abdominal aortic 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 rupture (HCC) 12/31/2014   Benign prostatic hypertrophy without urinary obstruction 07/31/2014   Enlarged prostate 07/31/2014   Benign essential HTN 06/11/2014   Carotid artery narrowing 02/08/2014   Carotid artery obstruction 02/08/2014   Bilateral carotid artery stenosis 02/08/2014   Chest pain 08/20/2013   Bilateral cataracts 05/16/2013   Cataract 05/16/2013   Clinical depression 12/12/2012   Diabetes mellitus, type 2 (HCC) 12/12/2012   Combined fat and carbohydrate induced hyperlipemia 12/12/2012   Major depressive disorder, single episode,  unspecified 12/12/2012   Acid reflux 11/09/2012   Presence of stent in coronary artery 11/09/2012   BP (high blood pressure) 11/09/2012   Healed myocardial infarct 11/09/2012   Gastro-esophageal reflux disease without esophagitis 11/09/2012   History of cardiovascular surgery 11/09/2012   Fothergill's neuralgia 08/07/2012   Swelling of testicle 04/06/2012   Disorder of male genital organ 04/06/2012   Swelling of the testicles 04/06/2012   Arteriosclerosis of coronary artery 11/16/2011   CAD in native artery 11/16/2011   Fatigue 06/04/2011   Avitaminosis D 11/30/2010    ONSET DATE: 03/17/2022- Worsening symptoms  REFERRING DIAG:  G20.A1 (ICD-10-CM) - Parkinsons  R26.2 (ICD-10-CM) - Difficulty walking    THERAPY DIAG:  Difficulty in walking, not elsewhere classified  Muscle weakness (generalized)  Abnormality of gait and mobility  Unsteadiness on feet  Other abnormalities of gait and  mobility  Other lack of coordination  Chronic bilateral low back pain without sciatica  Rationale for Evaluation and Treatment: Rehabilitation  SUBJECTIVE:                                                                                                                                                                                             SUBJECTIVE STATEMENT:  Patient reports continued improvement with right sided pain   Patient reports 2/10  Pt accompanied by: significant other  PERTINENT HISTORY: Patient is a 77 year old male with referral to outpatient PT for Parkinsons and difficulty walking. He was recently seen by Neurology worsening symptoms of parkinson- difficulty with walking. He has past medical history of Arthritis, Bladder cancer, and Trigeminal Neuralgia.  PAIN:  Are you having pain? NO  PRECAUTIONS: Fall  WEIGHT BEARING RESTRICTIONS: No  FALLS: Has patient fallen in last 6 months? Yes. Number of falls 1  LIVING ENVIRONMENT: Lives with: lives with their  spouse Lives in: House/apartment Stairs: Yes: External: 2 steps; has grab bar Has following equipment at home: Dan Humphreys - 2 wheeled, Environmental consultant - 4 wheeled, and Grab bars  PLOF: Independent with household mobility with device, Independent with community mobility with device, Independent with transfers, and Requires assistive device for independence  PATIENT GOALS: Get to where I can walk better again and get my legs stronger.   OBJECTIVE:    TODAY'S TREATMENT:                                                                                                                              DATE: 11/24/22    Therex:   Seated thoracic rotation- hold 5-10 sec x 10 each side. Seated hip march march alt LE x 20 reps  Standing step tap onto 1st step x 10 reps without UE support and 2nd step  alt LE with 1 UE support. Patient reported as tiresome. Resistive Gait with upright walker- 4# AW - 600 feet in O2 sat= 94%  PROM to cervical region- Rotation and side bending to right x 30 sec  hold x 4 each.    NMR:   Dynamic standing on airex pad with eyes open hold 30 sec x 2 Dynamic standing on airex pad with eyes closed hold 30 sec x 2 (increased sway yet no LOB)  Dynamic hip march in place 4# AW x 20 reps alt LE with fingertip touch support       PATIENT EDUCATION: Education details: Plan of care; purpose of functional outcome measures.  Person educated: Patient Education method: Explanation, Demonstration, Tactile cues, Verbal cues, and Handouts Education comprehension: verbalized understanding, returned demonstration, verbal cues required, tactile cues required, and needs further education  HOME EXERCISE PROGRAM: Access Code: [email or web address] URL: [email or web address] Date: 08/11/2022 Prepared by: Maureen Ralphs  Exercises - Seated Lumbar Flexion Stretch  - 7 x weekly - 3 sets - 30 sec hold - Seated Thoracic Flexion and Rotation with Arms Crossed  - 1 x daily - 7 x weekly -  3 sets - 30 sec hold - Seated Figure 4 Piriformis Stretch  - 1 x daily - 7 x weekly - 3 sets - 10 reps  GOALS: Goals reviewed with patient? Yes  SHORT TERM GOALS: Target date: 09/20/2022  Pt will be independent with a comprehensive HEP in order to improve strength and balance in order to decrease fall risk and improve function at home and work.  Baseline: EVAL: Patient admits not performing much exercises - not moving around well. 10/25/2022-Patient reports not as compliant with previous HEP as instructed- Verbally reviewed Low back, cervical, thoracic stretching and importance of daily walking. 11/03/2022= Patient reports independent with cervical Stretches and some walking at home Goal status: MET   LONG TERM GOALS: Target date: 01/26/2023  Pt will improve FOTO to target score of 45 % to display perceived improvements in ability to complete ADL's.  Baseline: EVAL: 42: 11/03/2022= 55 Goal status: MET  2.  Pt will decrease 5TSTS by at least 5 seconds in order to demonstrate clinically significant improvement in LE strength.  Baseline: EVAL = 17.39 sec without UE support; 10/25/2022= 12.91 sec Goal status: PROGRESSING  3. Pt will increase by at least 0.15 m/s in order to demonstrate clinically significant improvement in community ambulation   Baseline: EVAL= 0.67 m/s; 10/25/2022= 0.77 m/s Goal status: PROGRESSING  4.  Pt will improve BERG by at least 3 points in order to demonstrate clinically significant improvement in balance.   Baseline: EVAL: to be assessed next visit: 08/23/2022= 36/56; 11/03/2022= 40/56 Goal status: MET  5.  Pt will decrease TUG to below 14 seconds/decrease in order to demonstrate decreased fall risk. Baseline: EVAL: 22.00 sec with upright 4WW; 10/25/2022= 15.98 sec with 4WW Goal status: PROGRESSING  6.  Pt will increase by at least 9m (143ft) in order to demonstrate clinically significant improvement in cardiopulmonary endurance and community ambulation   Baseline: EVAL: 3:45 min (450 feet with upright 4WW) 10/25/2022= 830 feet with 4WW Goal status: MET  7.   Patient will report returning to walking in to community places using most appropriate assistive device vs current use of scooter for improved abilities with mobility during community outings Baseline: EVAL: Current using scooter for many social/community outings; 10/25/2022- Paitent continues to scooter for long distance community distances- due to some fatigue or SOB.  Goal status: ONGOING  8.  Pt will increase > 1000 feet in order to demonstrate clinically significant improvement in cardiopulmonary endurance and community ambulation   Baseline: 10/25/2022= 830 feet with 4WW  Goal status= NEW  9.  Pt will improve BERG by at least 3 points in order to demonstrate clinically significant improvement in balance.   Baseline: 11/03/2022= 40/56 Goal status: NEW  ASSESSMENT:  CLINICAL IMPRESSION: Patient presents with good motivation. He was tight today with cervical stretching and required review of stretching that he can do at home. He performed well with some static standing balance on non-compliant surface and challenged with dynamic balance activities using ankle and hip righting reactions with some LOB but did improve with practice today. Better hip mobility and no complaint of pain during session today.   Pt will continue to benefit from skilled PT services to address deficits and impairment identified in evaluation in order to maximize independence and safety in basic mobility required for performance of ADL, IADL, and leisure.    OBJECTIVE IMPAIRMENTS: Abnormal gait, cardiopulmonary status limiting activity, decreased activity tolerance, decreased balance, decreased coordination, decreased endurance, decreased mobility, difficulty walking, decreased ROM, decreased strength, hypomobility, impaired flexibility, impaired sensation, impaired UE functional use, postural dysfunction, obesity,  and pain.   ACTIVITY LIMITATIONS: carrying, lifting, bending, standing, squatting, stairs, transfers, continence, and reach over head  PARTICIPATION LIMITATIONS: meal prep, cleaning, laundry, driving, shopping, community activity, and yard work  PERSONAL FACTORS: Age and 3+ comorbidities: HTN, Arthritis, Low back pain, Trigeminal Neuralgia  are also affecting patient's functional outcome.   REHAB POTENTIAL: Good  CLINICAL DECISION MAKING: Evolving/moderate complexity  EVALUATION COMPLEXITY: Moderate  PLAN:  PT FREQUENCY: 1-2x/week  PT DURATION: 12 weeks  PLANNED INTERVENTIONS: Therapeutic exercises, Therapeutic activity, Neuromuscular re-education, Balance training, Gait training, Patient/Family education, Self Care, Joint mobilization, Stair training, Vestibular training, Canalith repositioning, DME instructions, Dry Needling, Electrical stimulation, Spinal manipulation, Spinal mobilization, Cryotherapy, Moist heat, Manual therapy, and Re-evaluation  PLAN FOR NEXT SESSION: Progress therex, bed mobility, and balance training. Continue with  training in Well zone for UE/LE strengthening and cardiovascular training.    Lenda Kelp, PT 11/24/2022, 1:01 PM  1:01 PM, 11/24/22  Physical Therapist - [facility]  Outpatient Physical Therapy- Main Campus [phone]

## 2022-11-29 ENCOUNTER — Other Ambulatory Visit: Payer: Self-pay | Admitting: Urology

## 2022-11-29 ENCOUNTER — Ambulatory Visit: Payer: Medicare HMO

## 2022-11-29 DIAGNOSIS — R269 Unspecified abnormalities of gait and mobility: Secondary | ICD-10-CM

## 2022-11-29 DIAGNOSIS — M6281 Muscle weakness (generalized): Secondary | ICD-10-CM

## 2022-11-29 DIAGNOSIS — M545 Low back pain, unspecified: Secondary | ICD-10-CM | POA: Diagnosis not present

## 2022-11-29 DIAGNOSIS — R2689 Other abnormalities of gait and mobility: Secondary | ICD-10-CM

## 2022-11-29 DIAGNOSIS — R942 Abnormal results of pulmonary function studies: Secondary | ICD-10-CM | POA: Diagnosis not present

## 2022-11-29 DIAGNOSIS — R0902 Hypoxemia: Secondary | ICD-10-CM | POA: Diagnosis not present

## 2022-11-29 DIAGNOSIS — R278 Other lack of coordination: Secondary | ICD-10-CM | POA: Diagnosis not present

## 2022-11-29 DIAGNOSIS — R262 Difficulty in walking, not elsewhere classified: Secondary | ICD-10-CM

## 2022-11-29 DIAGNOSIS — R2681 Unsteadiness on feet: Secondary | ICD-10-CM

## 2022-11-29 DIAGNOSIS — R0609 Other forms of dyspnea: Secondary | ICD-10-CM | POA: Diagnosis not present

## 2022-11-29 DIAGNOSIS — G8929 Other chronic pain: Secondary | ICD-10-CM | POA: Diagnosis not present

## 2022-11-29 DIAGNOSIS — J439 Emphysema, unspecified: Secondary | ICD-10-CM | POA: Diagnosis not present

## 2022-11-29 NOTE — Therapy (Signed)
OUTPATIENT PHYSICAL THERAPY NEURO TREATMENT/Physical Therapy Progress Note   Dates of reporting period  10/25/2022   to   11/29/2022   Patient Name: [name] MRN: [id number] DOB:1945-05-20, 77 y.o., male Today's Date: 11/29/2022   PCP: Ardyth Man, PA- C REFERRING PROVIDER: Morene Crocker, MD  END OF SESSION:  PT End of Session - 11/29/22 1052     Visit Number 30    Number of Visits 46    Date for PT Re-Evaluation 01/26/23    Authorization Type [employer]    Progress Note Due on Visit 40    PT Start Time 1100    PT Stop Time 1136    PT Time Calculation (min) 36 min    Equipment Utilized During Treatment Gait belt    Activity Tolerance Patient limited by pain    Behavior During Therapy [facility] for tasks assessed/performed                         Past Medical History:  Diagnosis Date   Anxiety    Arteriosclerosis of coronary artery 11/16/2011   Overview:  Stent 10/2011 stent rca 2015 with collaterals to lad which is chronically occluded    Benign enlargement of prostate    Benign essential HTN 06/11/2014   Benign prostatic hypertrophy without urinary obstruction 07/31/2014   Bilateral cataracts 05/16/2013   Overview:  [facility]     Bone spur of foot    Left   BP (high blood pressure) 11/09/2012   Cancer (HCC)    skin (forehead) and bladder   Carotid artery narrowing 02/08/2014   Depression    Detrusor hypertrophy    Diabetes (HCC)    Diabetes mellitus, type 2 (HCC) 12/12/2012   Diverticulosis    Dyspnea    Esophageal reflux    Esophageal reflux    Fothergill's neuralgia 08/07/2012   Overview:  [facility]    Gastritis    GERD (gastroesophageal reflux disease)    Headache    cluster headaches   Healed myocardial infarct 11/09/2012   Hearing loss in left ear    Heart disease    Hematuria    Hemorrhoids    History of hiatal hernia 12/14/2017   small    Hypercholesteremia    Lesion of bladder    Myocardial infarct (HCC)     Presence of stent in coronary artery 11/09/2012   Rectal bleeding    Trigeminal neuralgia    Trigeminal neuralgia    Valvular heart disease    Vitamin D deficiency    Past Surgical History:  Procedure Laterality Date   APPENDECTOMY     BOTOX INJECTION N/A 12/21/2017   Procedure: Bladder BOTOX INJECTION;  Surgeon: Vanna Scotland, MD;  Location: [facility] ORS;  Service: Urology;  Laterality: N/A;   BOTOX INJECTION N/A 09/11/2018   Procedure: Bladder BOTOX INJECTION;  Surgeon: Vanna Scotland, MD;  Location: [facility] ORS;  Service: Urology;  Laterality: N/A;   CARDIAC CATHETERIZATION     CARDIAC CATHETERIZATION N/A 01/22/2015   Procedure: Left Heart Cath;  Surgeon: Lamar Blinks, MD;  Location: [facility] INVASIVE CV LAB;  Service: Cardiovascular;  Laterality: N/A;   CARDIAC CATHETERIZATION N/A 01/22/2015   Procedure: Coronary Stent Intervention;  Surgeon: Marcina Millard, MD;  Location: [facility] INVASIVE CV LAB;  Service: Cardiovascular;  Laterality: N/A;   CATARACT EXTRACTION, BILATERAL     COLONOSCOPY WITH PROPOFOL N/A 12/08/2015   Procedure: COLONOSCOPY WITH PROPOFOL;  Surgeon: Daphine Deutscher  Cyndia Skeeters, MD;  Location: [facility] ENDOSCOPY;  Service: Endoscopy;  Laterality: N/A;   COLONOSCOPY WITH PROPOFOL N/A 12/09/2015   Procedure: COLONOSCOPY WITH PROPOFOL;  Surgeon: Christena Deem, MD;  Location: [facility] ENDOSCOPY;  Service: Endoscopy;  Laterality: N/A;   CORONARY ANGIOPLASTY     5 stents   CORONARY STENT PLACEMENT  2015   x5   CYSTOSCOPY N/A 09/11/2018   Procedure: CYSTOSCOPY;  Surgeon: Vanna Scotland, MD;  Location: [facility] ORS;  Service: Urology;  Laterality: N/A;   CYSTOSCOPY WITH BIOPSY N/A 12/21/2017   Procedure: CYSTOSCOPY WITH BIOPSY;  Surgeon: Vanna Scotland, MD;  Location: [facility] ORS;  Service: Urology;  Laterality: N/A;   ESOPHAGOGASTRODUODENOSCOPY (EGD) WITH PROPOFOL N/A 12/08/2015   Procedure: ESOPHAGOGASTRODUODENOSCOPY (EGD) WITH PROPOFOL;  Surgeon: Christena Deem, MD;  Location: [facility]  ENDOSCOPY;  Service: Endoscopy;  Laterality: N/A;   ESOPHAGOGASTRODUODENOSCOPY (EGD) WITH PROPOFOL N/A 12/13/2017   Procedure: ESOPHAGOGASTRODUODENOSCOPY (EGD) WITH PROPOFOL;  Surgeon: Christena Deem, MD;  Location: [facility] ENDOSCOPY;  Service: Endoscopy;  Laterality: N/A;   EYE SURGERY     HERNIA REPAIR     kidney tumor remove     TRANSURETHRAL RESECTION OF BLADDER TUMOR WITH GYRUS (TURBT-GYRUS)  12/2013   UMBILICAL HERNIA REPAIR     urethral meatotomy     Patient Active Problem List   Diagnosis Date Noted   Class 2 obesity due to excess calories with body mass index (BMI) of 36.0 to 36.9 in adult 04/11/2020   Swelling of limb 03/28/2020   Lymphedema 03/28/2020   Trigeminal neuralgia 12/25/2019   Lower limb ulcer, calf, left, limited to breakdown of skin (HCC) 12/25/2019   OSA (obstructive sleep apnea) 01/23/2019   Acute systolic CHF (congestive heart failure) (HCC) 12/12/2018   Bruising 07/10/2018   Diet-controlled type 2 diabetes mellitus (HCC) 03/24/2018   Microalbuminuria 03/24/2018   Dizziness 11/17/2017   Simple chronic bronchitis (HCC) 09/22/2017   SOBOE (shortness of breath on exertion) 02/18/2017   Chronic midline low back pain without sciatica 12/29/2016   Periodic limb movement disorder 12/29/2016   Benign essential tremor 06/22/2016   Pure hypercholesterolemia 06/20/2015   Erectile dysfunction due to arterial insufficiency 06/16/2015   Unstable angina (HCC) 01/22/2015   Abdominal aortic 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 rupture (HCC) 12/31/2014   Benign prostatic hypertrophy without urinary obstruction 07/31/2014   Enlarged prostate 07/31/2014   Benign essential HTN 06/11/2014   Carotid artery narrowing 02/08/2014   Carotid artery obstruction 02/08/2014   Bilateral carotid artery stenosis 02/08/2014   Chest pain 08/20/2013   Bilateral cataracts 05/16/2013   Cataract 05/16/2013   Clinical depression 12/12/2012   Diabetes mellitus, type 2 (HCC) 12/12/2012   Combined fat  and carbohydrate induced hyperlipemia 12/12/2012   Major depressive disorder, single episode, unspecified 12/12/2012   Acid reflux 11/09/2012   Presence of stent in coronary artery 11/09/2012   BP (high blood pressure) 11/09/2012   Healed myocardial infarct 11/09/2012   Gastro-esophageal reflux disease without esophagitis 11/09/2012   History of cardiovascular surgery 11/09/2012   Fothergill's neuralgia 08/07/2012   Swelling of testicle 04/06/2012   Disorder of male genital organ 04/06/2012   Swelling of the testicles 04/06/2012   Arteriosclerosis of coronary artery 11/16/2011   CAD in native artery 11/16/2011   Fatigue 06/04/2011   Avitaminosis D 11/30/2010    ONSET DATE: 03/17/2022- Worsening symptoms  REFERRING DIAG:  G20.A1 (ICD-10-CM) - Parkinsons  R26.2 (ICD-10-CM) - Difficulty walking    THERAPY DIAG:  Difficulty in walking, not elsewhere classified  Muscle weakness (generalized)  Abnormality of gait and mobility  Unsteadiness on feet  Other abnormalities of gait and mobility  Other lack of coordination  Rationale for Evaluation and Treatment: Rehabilitation  SUBJECTIVE:                                                                                                                                                                                             SUBJECTIVE STATEMENT:  Patient reports his right hip/back has really been worse over the weekend but supposed to follow up with MD this week.   Patient reports 7/10  Pt accompanied by: significant other  PERTINENT HISTORY: Patient is a 77 year old male with referral to outpatient PT for Parkinsons and difficulty walking. He was recently seen by Neurology worsening symptoms of parkinson- difficulty with walking. He has past medical history of Arthritis, Bladder cancer, and Trigeminal Neuralgia.  PAIN:  Are you having pain? NO  PRECAUTIONS: Fall  WEIGHT BEARING RESTRICTIONS: No  FALLS: Has patient  fallen in last 6 months? Yes. Number of falls 1  LIVING ENVIRONMENT: Lives with: lives with their spouse Lives in: House/apartment Stairs: Yes: External: 2 steps; has grab bar Has following equipment at home: Dan Humphreys - 2 wheeled, Environmental consultant - 4 wheeled, and Grab bars  PLOF: Independent with household mobility with device, Independent with community mobility with device, Independent with transfers, and Requires assistive device for independence  PATIENT GOALS: Get to where I can walk better again and get my legs stronger.   OBJECTIVE:    TODAY'S TREATMENT:                                                                                                                              DATE: 11/29/22    Physical therapy treatment session today consisted of completing assessment of goals and administration of testing as demonstrated and documented in flow sheet, treatment, and goals section of this note. Addition treatments may be found below.   Attempted 6 min walk test- Patient had to stop at 3 min 30 sec due to Right  LE pain.  Manual therapy- with some moist heat on low back/right hip in supine/hooklye position  - Hamstring R- 30 sec hold x 4  -Single knee to chest R and LE- 30 sec hold x 3  - Piriformis R- hold 30 sec x 4  - Figure 4- hold 30 sec x 4  -long axis distraction- hold 30 sec x 3  - Sciatic nerve flossing- in progressive SLR positions x 3 min  PATIENT EDUCATION: Education details: Exercise technique Person educated: Patient Education method: Explanation, Demonstration, Tactile cues, Verbal cues, and Handouts Education comprehension: verbalized understanding, returned demonstration, verbal cues required, tactile cues required, and needs further education  HOME EXERCISE PROGRAM: Access Code: [email or web address] URL: [email or web address] Date: 08/11/2022 Prepared by: Maureen Ralphs  Exercises - Seated Lumbar Flexion Stretch  - 7 x weekly - 3 sets - 30 sec hold -  Seated Thoracic Flexion and Rotation with Arms Crossed  - 1 x daily - 7 x weekly - 3 sets - 30 sec hold - Seated Figure 4 Piriformis Stretch  - 1 x daily - 7 x weekly - 3 sets - 10 reps  GOALS: Goals reviewed with patient? Yes  SHORT TERM GOALS: Target date: 09/20/2022  Pt will be independent with a comprehensive HEP in order to improve strength and balance in order to decrease fall risk and improve function at home and work.  Baseline: EVAL: Patient admits not performing much exercises - not moving around well. 10/25/2022-Patient reports not as compliant with previous HEP as instructed- Verbally reviewed Low back, cervical, thoracic stretching and importance of daily walking. 11/03/2022= Patient reports independent with cervical Stretches and some walking at home Goal status: MET   LONG TERM GOALS: Target date: 01/26/2023  Pt will improve FOTO to target score of 45 % to display perceived improvements in ability to complete ADL's.  Baseline: EVAL: 42: 11/03/2022= 55 Goal status: MET  2.  Pt will decrease 5TSTS by at least 5 seconds in order to demonstrate clinically significant improvement in LE strength.  Baseline: EVAL = 17.39 sec without UE support; 10/25/2022= 12.91 sec Goal status: PROGRESSING  3. Pt will increase by at least 0.15 m/s in order to demonstrate clinically significant improvement in community ambulation   Baseline: EVAL= 0.67 m/s; 10/25/2022= 0.77 m/s Goal status: PROGRESSING  4.  Pt will improve BERG by at least 3 points in order to demonstrate clinically significant improvement in balance.   Baseline: EVAL: to be assessed next visit: 08/23/2022= 36/56; 11/03/2022= 40/56 Goal status: MET  5.  Pt will decrease TUG to below 14 seconds/decrease in order to demonstrate decreased fall risk. Baseline: EVAL: 22.00 sec with upright 4WW; 10/25/2022= 15.98 sec with 4WW Goal status: PROGRESSING  6.  Pt will increase by at least 46m (177ft) in order to demonstrate clinically  significant improvement in cardiopulmonary endurance and community ambulation  Baseline: EVAL: 3:45 min (450 feet with upright 4WW) 10/25/2022= 830 feet with 4WW Goal status: MET  7.   Patient will report returning to walking in to community places using most appropriate assistive device vs current use of scooter for improved abilities with mobility during community outings Baseline: EVAL: Current using scooter for many social/community outings; 10/25/2022- Paitent continues to scooter for long distance community distances- due to some fatigue or SOB.  Goal status: ONGOING  8.  Pt will increase > 1000 feet in order to demonstrate clinically significant improvement in cardiopulmonary endurance and community ambulation   Baseline: 10/25/2022= 830 feet with  0QM  Goal status= NEW  9.  Pt will improve BERG by at least 3 points in order to demonstrate clinically significant improvement in balance.   Baseline: 11/03/2022= 40/56 Goal status: NEW  ASSESSMENT:  CLINICAL IMPRESSION: Patient presents with increased overall pain- so unable to complete testing for progress note. He was only able to walk over 3 min yet stopped secondary to increased right low back/hip pain. He was uncomfortable during positioning for stretching but did state some relief with heat and stretching. He was (+) for SLR involvement and did respond to nerve flossing. Patient's condition has the potential to improve in response to therapy. Maximum improvement is yet to be obtained. The anticipated improvement is attainable and reasonable in a generally predictable time.   Pt will continue to benefit from skilled PT services to address deficits and impairment identified in evaluation in order to maximize independence and safety in basic mobility required for performance of ADL, IADL, and leisure.    OBJECTIVE IMPAIRMENTS: Abnormal gait, cardiopulmonary status limiting activity, decreased activity tolerance, decreased balance,  decreased coordination, decreased endurance, decreased mobility, difficulty walking, decreased ROM, decreased strength, hypomobility, impaired flexibility, impaired sensation, impaired UE functional use, postural dysfunction, obesity, and pain.   ACTIVITY LIMITATIONS: carrying, lifting, bending, standing, squatting, stairs, transfers, continence, and reach over head  PARTICIPATION LIMITATIONS: meal prep, cleaning, laundry, driving, shopping, community activity, and yard work  PERSONAL FACTORS: Age and 3+ comorbidities: HTN, Arthritis, Low back pain, Trigeminal Neuralgia  are also affecting patient's functional outcome.   REHAB POTENTIAL: Good  CLINICAL DECISION MAKING: Evolving/moderate complexity  EVALUATION COMPLEXITY: Moderate  PLAN:  PT FREQUENCY: 1-2x/week  PT DURATION: 12 weeks  PLANNED INTERVENTIONS: Therapeutic exercises, Therapeutic activity, Neuromuscular re-education, Balance training, Gait training, Patient/Family education, Self Care, Joint mobilization, Stair training, Vestibular training, Canalith repositioning, DME instructions, Dry Needling, Electrical stimulation, Spinal manipulation, Spinal mobilization, Cryotherapy, Moist heat, Manual therapy, and Re-evaluation  PLAN FOR NEXT SESSION: Progress therex, bed mobility, and balance training. Continue with  training in Well zone for UE/LE strengthening and cardiovascular training. Try to retest goals next visit.    Lenda Kelp, PT 11/29/2022, 12:33 PM  12:33 PM, 11/29/22  Physical Therapist - [facility]  Outpatient Physical Therapy- Main Campus [phone]

## 2022-12-01 ENCOUNTER — Ambulatory Visit: Payer: Medicare HMO

## 2022-12-01 DIAGNOSIS — M545 Low back pain, unspecified: Secondary | ICD-10-CM | POA: Diagnosis not present

## 2022-12-01 DIAGNOSIS — M5416 Radiculopathy, lumbar region: Secondary | ICD-10-CM | POA: Diagnosis not present

## 2022-12-06 ENCOUNTER — Ambulatory Visit: Payer: Medicare HMO | Attending: Neurology

## 2022-12-06 DIAGNOSIS — H35379 Puckering of macula, unspecified eye: Secondary | ICD-10-CM | POA: Diagnosis not present

## 2022-12-06 DIAGNOSIS — R262 Difficulty in walking, not elsewhere classified: Secondary | ICD-10-CM | POA: Insufficient documentation

## 2022-12-06 DIAGNOSIS — R278 Other lack of coordination: Secondary | ICD-10-CM | POA: Insufficient documentation

## 2022-12-06 DIAGNOSIS — G8929 Other chronic pain: Secondary | ICD-10-CM | POA: Diagnosis not present

## 2022-12-06 DIAGNOSIS — H2640 Unspecified secondary cataract: Secondary | ICD-10-CM | POA: Diagnosis not present

## 2022-12-06 DIAGNOSIS — Z0100 Encounter for examination of eyes and vision without abnormal findings: Secondary | ICD-10-CM | POA: Diagnosis not present

## 2022-12-06 DIAGNOSIS — R269 Unspecified abnormalities of gait and mobility: Secondary | ICD-10-CM | POA: Insufficient documentation

## 2022-12-06 DIAGNOSIS — R2681 Unsteadiness on feet: Secondary | ICD-10-CM | POA: Diagnosis not present

## 2022-12-06 DIAGNOSIS — R2689 Other abnormalities of gait and mobility: Secondary | ICD-10-CM | POA: Diagnosis not present

## 2022-12-06 DIAGNOSIS — M6281 Muscle weakness (generalized): Secondary | ICD-10-CM | POA: Insufficient documentation

## 2022-12-06 DIAGNOSIS — E119 Type 2 diabetes mellitus without complications: Secondary | ICD-10-CM | POA: Diagnosis not present

## 2022-12-06 DIAGNOSIS — M545 Low back pain, unspecified: Secondary | ICD-10-CM | POA: Diagnosis not present

## 2022-12-06 DIAGNOSIS — H35373 Puckering of macula, bilateral: Secondary | ICD-10-CM | POA: Diagnosis not present

## 2022-12-06 NOTE — Therapy (Signed)
OUTPATIENT PHYSICAL THERAPY NEURO TREATMENT    Patient Name: [name] MRN: [id number] DOB:[birth year], 77 y.o., male Today's Date: 12/06/2022   PCP: Ardyth Man, PA- C REFERRING PROVIDER: Morene Crocker, MD  END OF SESSION:  PT End of Session - 12/06/22 1105     Visit Number 31    Number of Visits 46    Date for PT Re-Evaluation 01/26/23    Authorization Type [employer]    Progress Note Due on Visit 40    PT Start Time 1100    PT Stop Time 1143    PT Time Calculation (min) 43 min    Equipment Utilized During Treatment Gait belt    Activity Tolerance Patient limited by pain    Behavior During Therapy [facility] for tasks assessed/performed                          Past Medical History:  Diagnosis Date   Anxiety    Arteriosclerosis of coronary artery 11/16/2011   Overview:  Stent 10/2011 stent rca 2015 with collaterals to lad which is chronically occluded    Benign enlargement of prostate    Benign essential HTN 06/11/2014   Benign prostatic hypertrophy without urinary obstruction 07/31/2014   Bilateral cataracts 05/16/2013   Overview:  [facility]     Bone spur of foot    Left   BP (high blood pressure) 11/09/2012   Cancer (HCC)    skin (forehead) and bladder   Carotid artery narrowing 02/08/2014   Depression    Detrusor hypertrophy    Diabetes (HCC)    Diabetes mellitus, type 2 (HCC) 12/12/2012   Diverticulosis    Dyspnea    Esophageal reflux    Esophageal reflux    Fothergill's neuralgia 08/07/2012   Overview:  [facility]    Gastritis    GERD (gastroesophageal reflux disease)    Headache    cluster headaches   Healed myocardial infarct 11/09/2012   Hearing loss in left ear    Heart disease    Hematuria    Hemorrhoids    History of hiatal hernia 12/14/2017   small    Hypercholesteremia    Lesion of bladder    Myocardial infarct (HCC)    Presence of stent in coronary artery 11/09/2012   Rectal bleeding    Trigeminal  neuralgia    Trigeminal neuralgia    Valvular heart disease    Vitamin D deficiency    Past Surgical History:  Procedure Laterality Date   APPENDECTOMY     BOTOX INJECTION N/A 12/21/2017   Procedure: Bladder BOTOX INJECTION;  Surgeon: Vanna Scotland, MD;  Location: [facility] ORS;  Service: Urology;  Laterality: N/A;   BOTOX INJECTION N/A 09/11/2018   Procedure: Bladder BOTOX INJECTION;  Surgeon: Vanna Scotland, MD;  Location: [facility] ORS;  Service: Urology;  Laterality: N/A;   CARDIAC CATHETERIZATION     CARDIAC CATHETERIZATION N/A 01/22/2015   Procedure: Left Heart Cath;  Surgeon: Lamar Blinks, MD;  Location: [facility] INVASIVE CV LAB;  Service: Cardiovascular;  Laterality: N/A;   CARDIAC CATHETERIZATION N/A 01/22/2015   Procedure: Coronary Stent Intervention;  Surgeon: Marcina Millard, MD;  Location: [facility] INVASIVE CV LAB;  Service: Cardiovascular;  Laterality: N/A;   CATARACT EXTRACTION, BILATERAL     COLONOSCOPY WITH PROPOFOL N/A 12/08/2015   Procedure: COLONOSCOPY WITH PROPOFOL;  Surgeon: Christena Deem, MD;  Location: [facility] ENDOSCOPY;  Service: Endoscopy;  Laterality: N/A;  COLONOSCOPY WITH PROPOFOL N/A 12/09/2015   Procedure: COLONOSCOPY WITH PROPOFOL;  Surgeon: Christena Deem, MD;  Location: [facility] ENDOSCOPY;  Service: Endoscopy;  Laterality: N/A;   CORONARY ANGIOPLASTY     5 stents   CORONARY STENT PLACEMENT  2015   x5   CYSTOSCOPY N/A 09/11/2018   Procedure: CYSTOSCOPY;  Surgeon: Vanna Scotland, MD;  Location: [facility] ORS;  Service: Urology;  Laterality: N/A;   CYSTOSCOPY WITH BIOPSY N/A 12/21/2017   Procedure: CYSTOSCOPY WITH BIOPSY;  Surgeon: Vanna Scotland, MD;  Location: [facility] ORS;  Service: Urology;  Laterality: N/A;   ESOPHAGOGASTRODUODENOSCOPY (EGD) WITH PROPOFOL N/A 12/08/2015   Procedure: ESOPHAGOGASTRODUODENOSCOPY (EGD) WITH PROPOFOL;  Surgeon: Christena Deem, MD;  Location: [facility] ENDOSCOPY;  Service: Endoscopy;  Laterality: N/A;   ESOPHAGOGASTRODUODENOSCOPY (EGD) WITH  PROPOFOL N/A 12/13/2017   Procedure: ESOPHAGOGASTRODUODENOSCOPY (EGD) WITH PROPOFOL;  Surgeon: Christena Deem, MD;  Location: [facility] ENDOSCOPY;  Service: Endoscopy;  Laterality: N/A;   EYE SURGERY     HERNIA REPAIR     kidney tumor remove     TRANSURETHRAL RESECTION OF BLADDER TUMOR WITH GYRUS (TURBT-GYRUS)  12/2013   UMBILICAL HERNIA REPAIR     urethral meatotomy     Patient Active Problem List   Diagnosis Date Noted   Class 2 obesity due to excess calories with body mass index (BMI) of 36.0 to 36.9 in adult 04/11/2020   Swelling of limb 03/28/2020   Lymphedema 03/28/2020   Trigeminal neuralgia 12/25/2019   Lower limb ulcer, calf, left, limited to breakdown of skin (HCC) 12/25/2019   OSA (obstructive sleep apnea) 01/23/2019   Acute systolic CHF (congestive heart failure) (HCC) 12/12/2018   Bruising 07/10/2018   Diet-controlled type 2 diabetes mellitus (HCC) 03/24/2018   Microalbuminuria 03/24/2018   Dizziness 11/17/2017   Simple chronic bronchitis (HCC) 09/22/2017   SOBOE (shortness of breath on exertion) 02/18/2017   Chronic midline low back pain without sciatica 12/29/2016   Periodic limb movement disorder 12/29/2016   Benign essential tremor 06/22/2016   Pure hypercholesterolemia 06/20/2015   Erectile dysfunction due to arterial insufficiency 06/16/2015   Unstable angina (HCC) 01/22/2015   Abdominal aortic 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 rupture (HCC) 12/31/2014   Benign prostatic hyperplasia without urinary obstruction 07/31/2014   Enlarged prostate 07/31/2014   Benign essential HTN 06/11/2014   Carotid artery narrowing 02/08/2014   Carotid artery obstruction 02/08/2014   Bilateral carotid artery stenosis 02/08/2014   Chest pain 08/20/2013   Bilateral cataracts 05/16/2013   Cataract 05/16/2013   Clinical depression 12/12/2012   Diabetes mellitus, type 2 (HCC) 12/12/2012   Combined fat and carbohydrate induced hyperlipemia 12/12/2012   Major depressive disorder, single  episode, unspecified 12/12/2012   Acid reflux 11/09/2012   Presence of stent in coronary artery 11/09/2012   BP (high blood pressure) 11/09/2012   Healed myocardial infarct 11/09/2012   Gastro-esophageal reflux disease without esophagitis 11/09/2012   History of cardiovascular surgery 11/09/2012   Fothergill's neuralgia 08/07/2012   Swelling of testicle 04/06/2012   Disorder of male genital organ 04/06/2012   Swelling of the testicles 04/06/2012   Arteriosclerosis of coronary artery 11/16/2011   CAD in native artery 11/16/2011   Fatigue 06/04/2011   Avitaminosis D 11/30/2010    ONSET DATE: 03/17/2022- Worsening symptoms  REFERRING DIAG:  G20.A1 (ICD-10-CM) - Parkinsons  R26.2 (ICD-10-CM) - Difficulty walking    THERAPY DIAG:  Difficulty in walking, not elsewhere classified  Muscle weakness (generalized)  Abnormality of gait and mobility  Unsteadiness on feet  Other  abnormalities of gait and mobility  Other lack of coordination  Chronic bilateral low back pain without sciatica  Rationale for Evaluation and Treatment: Rehabilitation  SUBJECTIVE:                                                                                                                                                                                             SUBJECTIVE STATEMENT:  Patient reports he saw the MD last week and going to have an MRI- States was feeling some better til this morning.   Patient reports his right hip/back has really been worse over the weekend but supposed to follow up with MD this week.   Patient reports 7/10  Pt accompanied by: significant other  PERTINENT HISTORY: Patient is a 78 year old male with referral to outpatient PT for Parkinsons and difficulty walking. He was recently seen by Neurology worsening symptoms of parkinson- difficulty with walking. He has past medical history of Arthritis, Bladder cancer, and Trigeminal Neuralgia.  PAIN:  Are you having pain?  NO  PRECAUTIONS: Fall  WEIGHT BEARING RESTRICTIONS: No  FALLS: Has patient fallen in last 6 months? Yes. Number of falls 1  LIVING ENVIRONMENT: Lives with: lives with their spouse Lives in: House/apartment Stairs: Yes: External: 2 steps; has grab bar Has following equipment at home: Dan Humphreys - 2 wheeled, Environmental consultant - 4 wheeled, and Grab bars  PLOF: Independent with household mobility with device, Independent with community mobility with device, Independent with transfers, and Requires assistive device for independence  PATIENT GOALS: Get to where I can walk better again and get my legs stronger.   OBJECTIVE:    TODAY'S TREATMENT:                                                                                                                              DATE: 12/06/22    THEREX- VC for as painfree as possible Seated hip march- alt LE x 20  Seated knee ext - alt LE x 20  Seated lumbar flex- no pain 2 sets of 10 reps  Seated sciatic  nerve flossing x 20 reps Sit to stand 2 sets of 10 reps - with minimal UE Support on 1st set and no UE Support on 2nd set.  Walking forward/retro steps with 4WW- 6-7 steps forward/9 steps retro Ambulation using 4WW- CGA - Shuffling with VC to increase step height/length.  Seated calf raises 2 sets x 15 reps Seated toe raises- 2 sets x 15 reps   PATIENT EDUCATION: Education details: Exercise technique Person educated: Patient Education method: Explanation, Demonstration, Tactile cues, Verbal cues, and Handouts Education comprehension: verbalized understanding, returned demonstration, verbal cues required, tactile cues required, and needs further education  HOME EXERCISE PROGRAM: Access Code: [email or web address] URL: [email or web address] Date: 08/11/2022 Prepared by: Maureen Ralphs  Exercises - Seated Lumbar Flexion Stretch  - 7 x weekly - 3 sets - 30 sec hold - Seated Thoracic Flexion and Rotation with Arms Crossed  - 1 x daily - 7 x weekly  - 3 sets - 30 sec hold - Seated Figure 4 Piriformis Stretch  - 1 x daily - 7 x weekly - 3 sets - 10 reps  GOALS: Goals reviewed with patient? Yes  SHORT TERM GOALS: Target date: 09/20/2022  Pt will be independent with a comprehensive HEP in order to improve strength and balance in order to decrease fall risk and improve function at home and work.  Baseline: EVAL: Patient admits not performing much exercises - not moving around well. 10/25/2022-Patient reports not as compliant with previous HEP as instructed- Verbally reviewed Low back, cervical, thoracic stretching and importance of daily walking. 11/03/2022= Patient reports independent with cervical Stretches and some walking at home Goal status: MET   LONG TERM GOALS: Target date: 01/26/2023  Pt will improve FOTO to target score of 45 % to display perceived improvements in ability to complete ADL's.  Baseline: EVAL: 42: 11/03/2022= 55 Goal status: MET  2.  Pt will decrease 5TSTS by at least 5 seconds in order to demonstrate clinically significant improvement in LE strength.  Baseline: EVAL = 17.39 sec without UE support; 10/25/2022= 12.91 sec Goal status: PROGRESSING  3. Pt will increase by at least 0.15 m/s in order to demonstrate clinically significant improvement in community ambulation   Baseline: EVAL= 0.67 m/s; 10/25/2022= 0.77 m/s Goal status: PROGRESSING  4.  Pt will improve BERG by at least 3 points in order to demonstrate clinically significant improvement in balance.   Baseline: EVAL: to be assessed next visit: 08/23/2022= 36/56; 11/03/2022= 40/56 Goal status: MET  5.  Pt will decrease TUG to below 14 seconds/decrease in order to demonstrate decreased fall risk. Baseline: EVAL: 22.00 sec with upright 4WW; 10/25/2022= 15.98 sec with 4WW Goal status: PROGRESSING  6.  Pt will increase by at least 55m (158ft) in order to demonstrate clinically significant improvement in cardiopulmonary endurance and community ambulation   Baseline: EVAL: 3:45 min (450 feet with upright 4WW) 10/25/2022= 830 feet with 4WW Goal status: MET  7.   Patient will report returning to walking in to community places using most appropriate assistive device vs current use of scooter for improved abilities with mobility during community outings Baseline: EVAL: Current using scooter for many social/community outings; 10/25/2022- Paitent continues to scooter for long distance community distances- due to some fatigue or SOB.  Goal status: ONGOING  8.  Pt will increase > 1000 feet in order to demonstrate clinically significant improvement in cardiopulmonary endurance and community ambulation   Baseline: 10/25/2022= 830 feet with 4WW  Goal status= NEW  9.  Pt will improve BERG by at least 3 points in order to demonstrate clinically significant improvement in balance.   Baseline: 11/03/2022= 40/56 Goal status: NEW  ASSESSMENT:  CLINICAL IMPRESSION: Treatment consisted some pain free therex today as he continues to recover from recent exacerbation of low back pain. He was able to move his legs better and even do some lumbar stretching today without pain. He remains guarded but moving better overall- still shuffling despite verbal cues.  Pt will continue to benefit from skilled PT services to address deficits and impairment identified in evaluation in order to maximize independence and safety in basic mobility required for performance of ADL, IADL, and leisure.    OBJECTIVE IMPAIRMENTS: Abnormal gait, cardiopulmonary status limiting activity, decreased activity tolerance, decreased balance, decreased coordination, decreased endurance, decreased mobility, difficulty walking, decreased ROM, decreased strength, hypomobility, impaired flexibility, impaired sensation, impaired UE functional use, postural dysfunction, obesity, and pain.   ACTIVITY LIMITATIONS: carrying, lifting, bending, standing, squatting, stairs, transfers, continence, and reach over  head  PARTICIPATION LIMITATIONS: meal prep, cleaning, laundry, driving, shopping, community activity, and yard work  PERSONAL FACTORS: Age and 3+ comorbidities: HTN, Arthritis, Low back pain, Trigeminal Neuralgia  are also affecting patient's functional outcome.   REHAB POTENTIAL: Good  CLINICAL DECISION MAKING: Evolving/moderate complexity  EVALUATION COMPLEXITY: Moderate  PLAN:  PT FREQUENCY: 1-2x/week  PT DURATION: 12 weeks  PLANNED INTERVENTIONS: Therapeutic exercises, Therapeutic activity, Neuromuscular re-education, Balance training, Gait training, Patient/Family education, Self Care, Joint mobilization, Stair training, Vestibular training, Canalith repositioning, DME instructions, Dry Needling, Electrical stimulation, Spinal manipulation, Spinal mobilization, Cryotherapy, Moist heat, Manual therapy, and Re-evaluation  PLAN FOR NEXT SESSION: Progress therex, bed mobility, and balance training. Continue with  training in Well zone for UE/LE strengthening and cardiovascular training. Try to retest goals next visit.    Lenda Kelp, PT 12/06/2022, 1:02 PM  1:02 PM, 12/06/22  Physical Therapist - [facility]  Outpatient Physical Therapy- Main Campus [phone]

## 2022-12-08 ENCOUNTER — Ambulatory Visit: Payer: Medicare HMO

## 2022-12-08 DIAGNOSIS — R262 Difficulty in walking, not elsewhere classified: Secondary | ICD-10-CM | POA: Diagnosis not present

## 2022-12-08 DIAGNOSIS — R269 Unspecified abnormalities of gait and mobility: Secondary | ICD-10-CM | POA: Diagnosis not present

## 2022-12-08 DIAGNOSIS — G8929 Other chronic pain: Secondary | ICD-10-CM

## 2022-12-08 DIAGNOSIS — M6281 Muscle weakness (generalized): Secondary | ICD-10-CM

## 2022-12-08 DIAGNOSIS — M545 Low back pain, unspecified: Secondary | ICD-10-CM | POA: Diagnosis not present

## 2022-12-08 DIAGNOSIS — R2689 Other abnormalities of gait and mobility: Secondary | ICD-10-CM

## 2022-12-08 DIAGNOSIS — R2681 Unsteadiness on feet: Secondary | ICD-10-CM | POA: Diagnosis not present

## 2022-12-08 DIAGNOSIS — R278 Other lack of coordination: Secondary | ICD-10-CM | POA: Diagnosis not present

## 2022-12-08 NOTE — Therapy (Unsigned)
OUTPATIENT PHYSICAL THERAPY NEURO TREATMENT    Patient Name: [name] MRN: [id number] DOB:1945/04/04, 77 y.o., male Today's Date: 12/08/2022   PCP: Ardyth Man, PA- C REFERRING PROVIDER: Morene Crocker, MD  END OF SESSION:  PT End of Session - 12/08/22 1110     Visit Number 32    Number of Visits 46    Date for PT Re-Evaluation 01/26/23    Authorization Type [employer]    Progress Note Due on Visit 40    PT Start Time 1102    Equipment Utilized During Treatment Gait belt    Activity Tolerance Patient limited by pain    Behavior During Therapy [facility] for tasks assessed/performed                          Past Medical History:  Diagnosis Date   Anxiety    Arteriosclerosis of coronary artery 11/16/2011   Overview:  Stent 10/2011 stent rca 2015 with collaterals to lad which is chronically occluded    Benign enlargement of prostate    Benign essential HTN 06/11/2014   Benign prostatic hypertrophy without urinary obstruction 07/31/2014   Bilateral cataracts 05/16/2013   Overview:  [facility]     Bone spur of foot    Left   BP (high blood pressure) 11/09/2012   Cancer (HCC)    skin (forehead) and bladder   Carotid artery narrowing 02/08/2014   Depression    Detrusor hypertrophy    Diabetes (HCC)    Diabetes mellitus, type 2 (HCC) 12/12/2012   Diverticulosis    Dyspnea    Esophageal reflux    Esophageal reflux    Fothergill's neuralgia 08/07/2012   Overview:  [facility]    Gastritis    GERD (gastroesophageal reflux disease)    Headache    cluster headaches   Healed myocardial infarct 11/09/2012   Hearing loss in left ear    Heart disease    Hematuria    Hemorrhoids    History of hiatal hernia 12/14/2017   small    Hypercholesteremia    Lesion of bladder    Myocardial infarct (HCC)    Presence of stent in coronary artery 11/09/2012   Rectal bleeding    Trigeminal neuralgia    Trigeminal neuralgia    Valvular heart  disease    Vitamin D deficiency    Past Surgical History:  Procedure Laterality Date   APPENDECTOMY     BOTOX INJECTION N/A 12/21/2017   Procedure: Bladder BOTOX INJECTION;  Surgeon: Vanna Scotland, MD;  Location: [facility] ORS;  Service: Urology;  Laterality: N/A;   BOTOX INJECTION N/A 09/11/2018   Procedure: Bladder BOTOX INJECTION;  Surgeon: Vanna Scotland, MD;  Location: [facility] OR[facility];  Service: Urology;  Laterality: N/A;   CARDIAC CATHETERIZATION     CARDIAC CATHETERIZATION N/A 01/22/2015   Procedure: Left Heart Cath;  Surgeon: Lamar Blinks, MD;  Location: [facility] INVASIVE CV LAB;  Service: Cardiovascular;  Laterality: N/A;   CARDIAC CATHETERIZATION N/A 01/22/2015   Procedure: Coronary Stent Intervention;  Surgeon: Marcina Millard, MD;  Location: [facility] INVASIVE CV LAB;  Service: Cardiovascular;  Laterality: N/A;   CATARACT EXTRACTION, BILATERAL     COLONOSCOPY WITH PROPOFOL N/A 12/08/2015   Procedure: COLONOSCOPY WITH PROPOFOL;  Surgeon: Christena Deem, MD;  Location: [facility] ENDOSCOPY;  Service: Endoscopy;  Laterality: N/A;   COLONOSCOPY WITH PROPOFOL N/A 12/09/2015   Procedure: COLONOSCOPY WITH PROPOFOL;  Surgeon: Christena Deem,  MD;  Location: [facility] ENDOSCOPY;  Service: Endoscopy;  Laterality: N/A;   CORONARY ANGIOPLASTY     5 stents   CORONARY STENT PLACEMENT  2015   x5   CYSTOSCOPY N/A 09/11/2018   Procedure: CYSTOSCOPY;  Surgeon: Vanna Scotland, MD;  Location: [facility] ORS;  Service: Urology;  Laterality: N/A;   CYSTOSCOPY WITH BIOPSY N/A 12/21/2017   Procedure: CYSTOSCOPY WITH BIOPSY;  Surgeon: Vanna Scotland, MD;  Location: [facility] ORS;  Service: Urology;  Laterality: N/A;   ESOPHAGOGASTRODUODENOSCOPY (EGD) WITH PROPOFOL N/A 12/08/2015   Procedure: ESOPHAGOGASTRODUODENOSCOPY (EGD) WITH PROPOFOL;  Surgeon: Christena Deem, MD;  Location: [facility] ENDOSCOPY;  Service: Endoscopy;  Laterality: N/A;   ESOPHAGOGASTRODUODENOSCOPY (EGD) WITH PROPOFOL N/A 12/13/2017   Procedure:  ESOPHAGOGASTRODUODENOSCOPY (EGD) WITH PROPOFOL;  Surgeon: Christena Deem, MD;  Location: [facility] ENDOSCOPY;  Service: Endoscopy;  Laterality: N/A;   EYE SURGERY     HERNIA REPAIR     kidney tumor remove     TRANSURETHRAL RESECTION OF BLADDER TUMOR WITH GYRUS (TURBT-GYRUS)  12/2013   UMBILICAL HERNIA REPAIR     urethral meatotomy     Patient Active Problem List   Diagnosis Date Noted   Class 2 obesity due to excess calories with body mass index (BMI) of 36.0 to 36.9 in adult 04/11/2020   Swelling of limb 03/28/2020   Lymphedema 03/28/2020   Trigeminal neuralgia 12/25/2019   Lower limb ulcer, calf, left, limited to breakdown of skin (HCC) 12/25/2019   OSA (obstructive sleep apnea) 01/23/2019   Acute systolic CHF (congestive heart failure) (HCC) 12/12/2018   Bruising 07/10/2018   Diet-controlled type 2 diabetes mellitus (HCC) 03/24/2018   Microalbuminuria 03/24/2018   Dizziness 11/17/2017   Simple chronic bronchitis (HCC) 09/22/2017   SOBOE (shortness of breath on exertion) 02/18/2017   Chronic midline low back pain without sciatica 12/29/2016   Periodic limb movement disorder 12/29/2016   Benign essential tremor 06/22/2016   Pure hypercholesterolemia 06/20/2015   Erectile dysfunction due to arterial insufficiency 06/16/2015   Unstable angina (HCC) 01/22/2015   Abdominal aortic 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 rupture (HCC) 12/31/2014   Benign prostatic hyperplasia without urinary obstruction 07/31/2014   Enlarged prostate 07/31/2014   Benign essential HTN 06/11/2014   Carotid artery narrowing 02/08/2014   Carotid artery obstruction 02/08/2014   Bilateral carotid artery stenosis 02/08/2014   Chest pain 08/20/2013   Bilateral cataracts 05/16/2013   Cataract 05/16/2013   Clinical depression 12/12/2012   Diabetes mellitus, type 2 (HCC) 12/12/2012   Combined fat and carbohydrate induced hyperlipemia 12/12/2012   Major depressive disorder, single episode, unspecified 12/12/2012   Acid  reflux 11/09/2012   Presence of stent in coronary artery 11/09/2012   BP (high blood pressure) 11/09/2012   Healed myocardial infarct 11/09/2012   Gastro-esophageal reflux disease without esophagitis 11/09/2012   History of cardiovascular surgery 11/09/2012   Fothergill's neuralgia 08/07/2012   Swelling of testicle 04/06/2012   Disorder of male genital organ 04/06/2012   Swelling of the testicles 04/06/2012   Arteriosclerosis of coronary artery 11/16/2011   CAD in native artery 11/16/2011   Fatigue 06/04/2011   Avitaminosis D 11/30/2010    ONSET DATE: 03/17/2022- Worsening symptoms  REFERRING DIAG:  G20.A1 (ICD-10-CM) - Parkinsons  R26.2 (ICD-10-CM) - Difficulty walking    THERAPY DIAG:  Difficulty in walking, not elsewhere classified  Muscle weakness (generalized)  Abnormality of gait and mobility  Unsteadiness on feet  Other abnormalities of gait and mobility  Other lack of coordination  Chronic bilateral low back pain  without sciatica  Rationale for Evaluation and Treatment: Rehabilitation  SUBJECTIVE:                                                                                                                                                                                             SUBJECTIVE STATEMENT:  Patient reports he tried to do some exercises and even used the bike.    Patient reports 0/10 low back pain or RLE Pain.    Pt accompanied by: significant other  PERTINENT HISTORY: Patient is a 77 year old male with referral to outpatient PT for Parkinsons and difficulty walking. He was recently seen by Neurology worsening symptoms of parkinson- difficulty with walking. He has past medical history of Arthritis, Bladder cancer, and Trigeminal Neuralgia.  PAIN:  Are you having pain? NO  PRECAUTIONS: Fall  WEIGHT BEARING RESTRICTIONS: No  FALLS: Has patient fallen in last 6 months? Yes. Number of falls 1  LIVING ENVIRONMENT: Lives with: lives with  their spouse Lives in: House/apartment Stairs: Yes: External: 2 steps; has grab bar Has following equipment at home: Dan Humphreys - 2 wheeled, Environmental consultant - 4 wheeled, and Grab bars  PLOF: Independent with household mobility with device, Independent with community mobility with device, Independent with transfers, and Requires assistive device for independence  PATIENT GOALS: Get to where I can walk better again and get my legs stronger.   OBJECTIVE:    TODAY'S TREATMENT:                                                                                                                              DATE: 12/08/22    THEREX-   Lumbar stretch- forward lean in chair   Circuit style-  1st rd:  walk approx 40 feet then perform LE exercise: standing hip march x 20 alt LE- another 60 feet- standing calf raises x 20 - another 60 feet- toe raises. REST 2nd rd.  Walk approx 40 feet - sit to sand- another 60 feet- standing hip ext - another 60 feet- Standing hip abd  3rd round  PATIENT EDUCATION: Education details: Exercise technique Person educated: Patient Education method: Explanation, Demonstration, Tactile cues, Verbal cues, and Handouts Education comprehension: verbalized understanding, returned demonstration, verbal cues required, tactile cues required, and needs further education  HOME EXERCISE PROGRAM: Access Code: [email or web address] URL: [email or web address] Date: 08/11/2022 Prepared by: Maureen Ralphs  Exercises - Seated Lumbar Flexion Stretch  - 7 x weekly - 3 sets - 30 sec hold - Seated Thoracic Flexion and Rotation with Arms Crossed  - 1 x daily - 7 x weekly - 3 sets - 30 sec hold - Seated Figure 4 Piriformis Stretch  - 1 x daily - 7 x weekly - 3 sets - 10 reps  GOALS: Goals reviewed with patient? Yes  SHORT TERM GOALS: Target date: 09/20/2022  Pt will be independent with a comprehensive HEP in order to improve strength and balance in order to decrease fall risk and  improve function at home and work.  Baseline: EVAL: Patient admits not performing much exercises - not moving around well. 10/25/2022-Patient reports not as compliant with previous HEP as instructed- Verbally reviewed Low back, cervical, thoracic stretching and importance of daily walking. 11/03/2022= Patient reports independent with cervical Stretches and some walking at home Goal status: MET   LONG TERM GOALS: Target date: 01/26/2023  Pt will improve FOTO to target score of 45 % to display perceived improvements in ability to complete ADL's.  Baseline: EVAL: 42: 11/03/2022= 55 Goal status: MET  2.  Pt will decrease 5TSTS by at least 5 seconds in order to demonstrate clinically significant improvement in LE strength.  Baseline: EVAL = 17.39 sec without UE support; 10/25/2022= 12.91 sec Goal status: PROGRESSING  3. Pt will increase by at least 0.15 m/s in order to demonstrate clinically significant improvement in community ambulation   Baseline: EVAL= 0.67 m/s; 10/25/2022= 0.77 m/s Goal status: PROGRESSING  4.  Pt will improve BERG by at least 3 points in order to demonstrate clinically significant improvement in balance.   Baseline: EVAL: to be assessed next visit: 08/23/2022= 36/56; 11/03/2022= 40/56 Goal status: MET  5.  Pt will decrease TUG to below 14 seconds/decrease in order to demonstrate decreased fall risk. Baseline: EVAL: 22.00 sec with upright 4WW; 10/25/2022= 15.98 sec with 4WW Goal status: PROGRESSING  6.  Pt will increase by at least 33m (151ft) in order to demonstrate clinically significant improvement in cardiopulmonary endurance and community ambulation  Baseline: EVAL: 3:45 min (450 feet with upright 4WW) 10/25/2022= 830 feet with 4WW Goal status: MET  7.   Patient will report returning to walking in to community places using most appropriate assistive device vs current use of scooter for improved abilities with mobility during community outings Baseline: EVAL:  Current using scooter for many social/community outings; 10/25/2022- Paitent continues to scooter for long distance community distances- due to some fatigue or SOB.  Goal status: ONGOING  8.  Pt will increase > 1000 feet in order to demonstrate clinically significant improvement in cardiopulmonary endurance and community ambulation   Baseline: 10/25/2022= 830 feet with 4WW  Goal status= NEW  9.  Pt will improve BERG by at least 3 points in order to demonstrate clinically significant improvement in balance.   Baseline: 11/03/2022= 40/56 Goal status: NEW  ASSESSMENT:  CLINICAL IMPRESSION: Treatment consisted some pain free therex today as he continues to recover from recent exacerbation of low back pain. He was able to move his legs better and even do some lumbar stretching today without pain. He remains guarded  but moving better overall- still shuffling despite verbal cues.  Pt will continue to benefit from skilled PT services to address deficits and impairment identified in evaluation in order to maximize independence and safety in basic mobility required for performance of ADL, IADL, and leisure.    OBJECTIVE IMPAIRMENTS: Abnormal gait, cardiopulmonary status limiting activity, decreased activity tolerance, decreased balance, decreased coordination, decreased endurance, decreased mobility, difficulty walking, decreased ROM, decreased strength, hypomobility, impaired flexibility, impaired sensation, impaired UE functional use, postural dysfunction, obesity, and pain.   ACTIVITY LIMITATIONS: carrying, lifting, bending, standing, squatting, stairs, transfers, continence, and reach over head  PARTICIPATION LIMITATIONS: meal prep, cleaning, laundry, driving, shopping, community activity, and yard work  PERSONAL FACTORS: Age and 3+ comorbidities: HTN, Arthritis, Low back pain, Trigeminal Neuralgia  are also affecting patient's functional outcome.   REHAB POTENTIAL: Good  CLINICAL DECISION  MAKING: Evolving/moderate complexity  EVALUATION COMPLEXITY: Moderate  PLAN:  PT FREQUENCY: 1-2x/week  PT DURATION: 12 weeks  PLANNED INTERVENTIONS: Therapeutic exercises, Therapeutic activity, Neuromuscular re-education, Balance training, Gait training, Patient/Family education, Self Care, Joint mobilization, Stair training, Vestibular training, Canalith repositioning, DME instructions, Dry Needling, Electrical stimulation, Spinal manipulation, Spinal mobilization, Cryotherapy, Moist heat, Manual therapy, and Re-evaluation  PLAN FOR NEXT SESSION: Progress therex, bed mobility, and balance training. Continue with  training in Well zone for UE/LE strengthening and cardiovascular training. Try to retest goals next visit.    Lenda Kelp, PT 12/08/2022, 11:20 AM  11:20 AM, 12/08/22  Physical Therapist - [facility]  Outpatient Physical Therapy- Main Campus [phone]

## 2022-12-13 ENCOUNTER — Telehealth: Payer: Self-pay

## 2022-12-13 ENCOUNTER — Ambulatory Visit: Payer: Medicare HMO

## 2022-12-13 NOTE — Telephone Encounter (Signed)
The patient called wanting to schedule colonoscopy. I informed him that he will have to get a referral sent to our office before scheduling.

## 2022-12-15 ENCOUNTER — Ambulatory Visit: Payer: Medicare HMO

## 2022-12-15 DIAGNOSIS — M545 Low back pain, unspecified: Secondary | ICD-10-CM | POA: Diagnosis not present

## 2022-12-20 ENCOUNTER — Ambulatory Visit: Payer: Medicare HMO | Admitting: Dermatology

## 2022-12-20 ENCOUNTER — Encounter: Payer: Self-pay | Admitting: Dermatology

## 2022-12-20 ENCOUNTER — Ambulatory Visit: Payer: Medicare HMO

## 2022-12-20 DIAGNOSIS — L578 Other skin changes due to chronic exposure to nonionizing radiation: Secondary | ICD-10-CM | POA: Diagnosis not present

## 2022-12-20 DIAGNOSIS — L219 Seborrheic dermatitis, unspecified: Secondary | ICD-10-CM | POA: Diagnosis not present

## 2022-12-20 DIAGNOSIS — L570 Actinic keratosis: Secondary | ICD-10-CM

## 2022-12-20 DIAGNOSIS — W908XXA Exposure to other nonionizing radiation, initial encounter: Secondary | ICD-10-CM | POA: Diagnosis not present

## 2022-12-20 NOTE — Progress Notes (Signed)
Follow-Up Visit   Subjective  [name] is a 77 y.o. male who presents for the following: 1 month seb derm follow up at [facility]. Patient currently using ketoconazole 2% shampoo, ketoconazole cream, fluocinolone oil, clobetasol solution and HC 2.5% cream. Patient advises he is improved.  The patient has spots, moles and lesions to be evaluated, some may be new or changing and the patient may have concern these could be cancer.   The following portions of the chart were reviewed this encounter and updated as appropriate: medications, allergies, medical history  Review of Systems:  No other skin or systemic complaints except as noted in HPI or Assessment and Plan.  Objective  Well appearing patient in no apparent distress; mood and affect are within normal limits.   A focused examination was performed of the following areas: Face, scalp  Relevant exam findings are noted in the Assessment and Plan.  R Campion x 1, chin x 1, L Bontempo x 1, vertex scalp x 1, R temple scalp x 1 (5) Erythematous thin papules/macules with gritty scale.     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 Erythematous scaly patches of nasolabial folds. Interval reduction  Chronic and persistent condition with duration or expected duration over one year. Condition is symptomatic/ bothersome to patient. Not currently at goal.   Seborrheic Dermatitis is a chronic persistent rash characterized by pinkness and scaling most commonly of the mid face but also can occur on the scalp (dandruff), ears; mid chest, mid back and groin.  It tends to be exacerbated by stress and cooler weather.  People who have neurologic disease may experience new onset or exacerbation of existing seborrheic dermatitis.  The condition is not curable but treatable and can be controlled.  Treatment Plan: Elidel  too expensive Continue the Ketoconazole 2% shampoo 3d/wk, Ketoconazole 2% cream QD, and Fluocinolone oil 1-2 drops QD-BID PRN.  Continue Clobetasol 0.05% solution to aa's scalp QD-BID PRN.  Continue Hydrocortisone 2.5% cream to aa's face QD PRN.   ACTINIC DAMAGE WITH PRECANCEROUS ACTINIC KERATOSES Counseling for Topical Chemotherapy Management: Patient exhibits: - Severe, confluent actinic changes with pre-cancerous actinic keratoses that is secondary to cumulative UV radiation exposure over time - Condition that is severe; chronic, not at goal. - diffuse scaly erythematous macules and papules with underlying dyspigmentation - Discussed Prescription "Field Treatment" topical Chemotherapy for Severe, Chronic Confluent Actinic Changes with Pre-Cancerous Actinic Keratoses Field treatment involves treatment of an entire area of skin that has confluent Actinic Changes (Sun/ Ultraviolet light damage) and PreCancerous Actinic Keratoses by method of PhotoDynamic Therapy (PDT) and/or prescription Topical Chemotherapy agents such as 5-fluorouracil, 5-fluorouracil/calcipotriene, and/or imiquimod.  The purpose is to decrease the number of clinically evident and subclinical PreCancerous lesions to prevent progression to development of skin cancer by chemically destroying early precancer changes that may or may not be visible.  It has been shown to reduce the risk of developing skin cancer in the treated area. As a result of treatment, redness, scaling, crusting, and open sores may occur during treatment course. One or more than one of these methods may be used and may have to be used several times to control, suppress and eliminate the PreCancerous changes. Discussed treatment course, expected reaction, and possible side effects. - Recommend daily broad spectrum sunscreen SPF 30+ to sun-exposed areas, reapply every 2 hours as needed.  -  Staying in the shade or wearing long sleeves, sun glasses (UVA+UVB protection)  and wide brim hats (4-inch brim around the entire circumference of the hat) are also recommended. - Call for new or changing lesions.   AK (actinic keratosis) (5) R Busker x 1, chin x 1, L Ramella x 1, vertex scalp x 1, R temple scalp x 1  Actinic keratoses are precancerous spots that appear secondary to cumulative UV radiation exposure/sun exposure over time. They are chronic with expected duration over 1 year. A portion of actinic keratoses will progress to squamous cell carcinoma of the skin. It is not possible to reliably predict which spots will progress to skin cancer and so treatment is recommended to prevent development of skin cancer.  Recommend daily broad spectrum sunscreen SPF 30+ to sun-exposed areas, reapply every 2 hours as needed.  Recommend staying in the shade or wearing long sleeves, sun glasses (UVA+UVB protection) and wide brim hats (4-inch brim around the entire circumference of the hat). Call for new or changing lesions.   Destruction of lesion - R Pelaez x 1, chin x 1, L Brosch x 1, vertex scalp x 1, R temple scalp x 1 (5) Complexity: simple   Destruction method: cryotherapy   Informed consent: discussed and consent obtained   Timeout:  patient name, date of birth, surgical site, and procedure verified Lesion destroyed using liquid nitrogen: Yes   Region frozen until ice ball extended beyond lesion: Yes   Cryo cycles: 1 or 2. Outcome: patient tolerated procedure well with no complications   Post-procedure details: wound care instructions given      Return in about 3 months (around 03/22/2023) for AK follow up, Seb Derm, with Dr. Katrinka Blazing.  Anise Salvo, RMA, am acting as scribe for Elie Goody, MD .   Documentation: I have reviewed the above documentation for accuracy and completeness, and I agree with the above.  Elie Goody, MD

## 2022-12-20 NOTE — Patient Instructions (Addendum)
Continue the Ketoconazole 2% shampoo apply three times per week, massage into scalp and leave in for 5 - 10 minutes before rinsing out. May follow with regular shampoo. Continue Ketoconazole 2% cream once daily and Fluocinolone oil 1-2 drops 1-2 times daily as needed to scalp.  Continue Clobetasol 0.05% solution to affected areas of scalp 1-2 times daily as needed for itch. Avoid applying to face, groin, and axilla. Use as directed. Long-term use can cause thinning of the skin. Continue Hydrocortisone 2.5% cream to affected areas at face once daily as needed.   Topical steroids (such as triamcinolone, fluocinolone, fluocinonide, mometasone, clobetasol, halobetasol, betamethasone, hydrocortisone) can cause thinning and lightening of the skin if they are used for too long in the same area. Your physician has selected the right strength medicine for your problem and area affected on the body. Please use your medication only as directed by your physician to prevent side effects.   Cryotherapy Aftercare  Wash gently with soap and water everyday.   Apply Vaseline and Band-Aid daily until healed.     Due to recent changes in healthcare laws, you may see results of your pathology and/or laboratory studies on MyChart before the doctors have had a chance to review them. We understand that in some cases there may be results that are confusing or concerning to you. Please understand that not all results are received at the same time and often the doctors may need to interpret multiple results in order to provide you with the best plan of care or course of treatment. Therefore, we ask that you please give [place] 2 business days to thoroughly review all your results before contacting the office for clarification. Should we see a critical lab result, you will be contacted sooner.   If You Need Anything After Your Visit  If you have any questions or concerns for your doctor, please call our main line at [phone]  and press option 4 to reach your doctor's medical assistant. If no one answers, please leave a voicemail as directed and we will return your call as soon as possible. Messages left after 4 pm will be answered the following business day.   You may also send [place] a message via MyChart. We typically respond to MyChart messages within 1-2 business days.  For prescription refills, please ask your pharmacy to contact our office. Our fax number is [phone].  If you have an urgent issue when the clinic is closed that cannot wait until the next business day, you can page your doctor at the number below.    Please note that while we do our best to be available for urgent issues outside of office hours, we are not available 24/7.   If you have an urgent issue and are unable to reach [place], you may choose to seek medical care at your doctor's office, retail clinic, urgent care center, or emergency room.  If you have a medical emergency, please immediately call 911 or go to the emergency department.  Pager Numbers  - Dr. Gwen Pounds: [phone]  - Dr. Roseanne Reno: [phone]  - Dr. Katrinka Blazing: [phone]   In the event of inclement weather, please call our main line at [phone] for an update on the status of any delays or closures.  Dermatology Medication Tips: Please keep the boxes that topical medications come in in order to help keep track of the instructions about where and how to use these. Pharmacies typically print the medication instructions only on the boxes and not  directly on the medication tubes.   If your medication is too expensive, please contact our office at [phone] option 4 or send [place] a message through MyChart.   We are unable to tell what your co-pay for medications will be in advance as this is different depending on your insurance coverage. However, we may be able to find a substitute medication at lower cost or fill out paperwork to get insurance to cover a needed medication.    If a prior authorization is required to get your medication covered by your insurance company, please allow [place] 1-2 business days to complete this process.  Drug prices often vary depending on where the prescription is filled and some pharmacies may offer cheaper prices.  The website www.goodrx.com contains coupons for medications through different pharmacies. The prices here do not account for what the cost may be with help from insurance (it may be cheaper with your insurance), but the website can give you the price if you did not use any insurance.  - You can print the associated coupon and take it with your prescription to the pharmacy.  - You may also stop by our office during regular business hours and pick up a GoodRx coupon card.  - If you need your prescription sent electronically to a different pharmacy, notify our office through [facility] or by phone at [phone] option 4.

## 2022-12-21 DIAGNOSIS — I714 Abdominal aortic aneurysm, without rupture, unspecified: Secondary | ICD-10-CM | POA: Diagnosis not present

## 2022-12-21 DIAGNOSIS — Z1330 Encounter for screening examination for mental health and behavioral disorders, unspecified: Secondary | ICD-10-CM | POA: Diagnosis not present

## 2022-12-21 DIAGNOSIS — E119 Type 2 diabetes mellitus without complications: Secondary | ICD-10-CM | POA: Diagnosis not present

## 2022-12-21 DIAGNOSIS — I5022 Chronic systolic (congestive) heart failure: Secondary | ICD-10-CM | POA: Diagnosis not present

## 2022-12-21 DIAGNOSIS — Z1331 Encounter for screening for depression: Secondary | ICD-10-CM | POA: Diagnosis not present

## 2022-12-21 DIAGNOSIS — G20A1 Parkinson's disease without dyskinesia, without mention of fluctuations: Secondary | ICD-10-CM | POA: Diagnosis not present

## 2022-12-21 DIAGNOSIS — E782 Mixed hyperlipidemia: Secondary | ICD-10-CM | POA: Diagnosis not present

## 2022-12-21 DIAGNOSIS — Z0000 Encounter for general adult medical examination without abnormal findings: Secondary | ICD-10-CM | POA: Diagnosis not present

## 2022-12-21 DIAGNOSIS — J439 Emphysema, unspecified: Secondary | ICD-10-CM | POA: Insufficient documentation

## 2022-12-21 DIAGNOSIS — F419 Anxiety disorder, unspecified: Secondary | ICD-10-CM | POA: Diagnosis not present

## 2022-12-21 DIAGNOSIS — K5900 Constipation, unspecified: Secondary | ICD-10-CM | POA: Diagnosis not present

## 2022-12-21 DIAGNOSIS — F3342 Major depressive disorder, recurrent, in full remission: Secondary | ICD-10-CM | POA: Diagnosis not present

## 2022-12-22 ENCOUNTER — Ambulatory Visit: Payer: Medicare HMO

## 2022-12-22 DIAGNOSIS — M5416 Radiculopathy, lumbar region: Secondary | ICD-10-CM | POA: Diagnosis not present

## 2022-12-22 DIAGNOSIS — M48062 Spinal stenosis, lumbar region with neurogenic claudication: Secondary | ICD-10-CM | POA: Diagnosis not present

## 2022-12-22 DIAGNOSIS — M48061 Spinal stenosis, lumbar region without neurogenic claudication: Secondary | ICD-10-CM | POA: Insufficient documentation

## 2022-12-27 ENCOUNTER — Ambulatory Visit: Payer: Medicare HMO

## 2022-12-27 DIAGNOSIS — G8929 Other chronic pain: Secondary | ICD-10-CM

## 2022-12-27 DIAGNOSIS — M6281 Muscle weakness (generalized): Secondary | ICD-10-CM | POA: Diagnosis not present

## 2022-12-27 DIAGNOSIS — R262 Difficulty in walking, not elsewhere classified: Secondary | ICD-10-CM | POA: Diagnosis not present

## 2022-12-27 DIAGNOSIS — R278 Other lack of coordination: Secondary | ICD-10-CM | POA: Diagnosis not present

## 2022-12-27 DIAGNOSIS — R2681 Unsteadiness on feet: Secondary | ICD-10-CM | POA: Diagnosis not present

## 2022-12-27 DIAGNOSIS — R269 Unspecified abnormalities of gait and mobility: Secondary | ICD-10-CM

## 2022-12-27 DIAGNOSIS — R2689 Other abnormalities of gait and mobility: Secondary | ICD-10-CM

## 2022-12-27 DIAGNOSIS — M545 Low back pain, unspecified: Secondary | ICD-10-CM | POA: Diagnosis not present

## 2022-12-27 NOTE — Therapy (Signed)
OUTPATIENT PHYSICAL THERAPY NEURO TREATMENT    Patient Name: [name] MRN: [id number] DOB:Jun 15, 1945, 77 y.o., male Today's Date: 12/27/2022   PCP: Ardyth Man, PA- C REFERRING PROVIDER: Morene Crocker, MD  END OF SESSION:  PT End of Session - 12/27/22 1108     Visit Number 33    Number of Visits 46    Date for PT Re-Evaluation 01/26/23    Authorization Type [employer]    Progress Note Due on Visit 40    PT Start Time 1101    PT Stop Time 1145    PT Time Calculation (min) 44 min    Equipment Utilized During Treatment Gait belt    Activity Tolerance Patient limited by pain    Behavior During Therapy [facility] for tasks assessed/performed                            Past Medical History:  Diagnosis Date   Anxiety    Arteriosclerosis of coronary artery 11/16/2011   Overview:  Stent 10/2011 stent rca 2015 with collaterals to lad which is chronically occluded    Benign enlargement of prostate    Benign essential HTN 06/11/2014   Benign prostatic hypertrophy without urinary obstruction 07/31/2014   Bilateral cataracts 05/16/2013   Overview:  [facility]     Bone spur of foot    Left   BP (high blood pressure) 11/09/2012   Cancer (HCC)    skin (forehead) and bladder   Carotid artery narrowing 02/08/2014   Depression    Detrusor hypertrophy    Diabetes (HCC)    Diabetes mellitus, type 2 (HCC) 12/12/2012   Diverticulosis    Dyspnea    Esophageal reflux    Esophageal reflux    Fothergill's neuralgia 08/07/2012   Overview:  [facility]    Gastritis    GERD (gastroesophageal reflux disease)    Headache    cluster headaches   Healed myocardial infarct 11/09/2012   Hearing loss in left ear    Heart disease    Hematuria    Hemorrhoids    History of hiatal hernia 12/14/2017   small    Hypercholesteremia    Lesion of bladder    Myocardial infarct (HCC)    Presence of stent in coronary artery 11/09/2012   Rectal bleeding     Trigeminal neuralgia    Trigeminal neuralgia    Valvular heart disease    Vitamin D deficiency    Past Surgical History:  Procedure Laterality Date   APPENDECTOMY     BOTOX INJECTION N/A 12/21/2017   Procedure: Bladder BOTOX INJECTION;  Surgeon: Vanna Scotland, MD;  Location: [facility] ORS;  Service: Urology;  Laterality: N/A;   BOTOX INJECTION N/A 09/11/2018   Procedure: Bladder BOTOX INJECTION;  Surgeon: Vanna Scotland, MD;  Location: [facility] ORS;  Service: Urology;  Laterality: N/A;   CARDIAC CATHETERIZATION     CARDIAC CATHETERIZATION N/A 01/22/2015   Procedure: Left Heart Cath;  Surgeon: Lamar Blinks, MD;  Location: [facility] INVASIVE CV LAB;  Service: Cardiovascular;  Laterality: N/A;   CARDIAC CATHETERIZATION N/A 01/22/2015   Procedure: Coronary Stent Intervention;  Surgeon: Marcina Millard, MD;  Location: [facility] INVASIVE CV LAB;  Service: Cardiovascular;  Laterality: N/A;   CATARACT EXTRACTION, BILATERAL     COLONOSCOPY WITH PROPOFOL N/A 12/08/2015   Procedure: COLONOSCOPY WITH PROPOFOL;  Surgeon: Christena Deem, MD;  Location: [facility] ENDOSCOPY;  Service: Endoscopy;  Laterality: N/A;  COLONOSCOPY WITH PROPOFOL N/A 12/09/2015   Procedure: COLONOSCOPY WITH PROPOFOL;  Surgeon: Christena Deem, MD;  Location: [facility] ENDOSCOPY;  Service: Endoscopy;  Laterality: N/A;   CORONARY ANGIOPLASTY     5 stents   CORONARY STENT PLACEMENT  2015   x5   CYSTOSCOPY N/A 09/11/2018   Procedure: CYSTOSCOPY;  Surgeon: Vanna Scotland, MD;  Location: [facility] ORS;  Service: Urology;  Laterality: N/A;   CYSTOSCOPY WITH BIOPSY N/A 12/21/2017   Procedure: CYSTOSCOPY WITH BIOPSY;  Surgeon: Vanna Scotland, MD;  Location: [facility] ORS;  Service: Urology;  Laterality: N/A;   ESOPHAGOGASTRODUODENOSCOPY (EGD) WITH PROPOFOL N/A 12/08/2015   Procedure: ESOPHAGOGASTRODUODENOSCOPY (EGD) WITH PROPOFOL;  Surgeon: Christena Deem, MD;  Location: [facility] ENDOSCOPY;  Service: Endoscopy;  Laterality: N/A;   ESOPHAGOGASTRODUODENOSCOPY  (EGD) WITH PROPOFOL N/A 12/13/2017   Procedure: ESOPHAGOGASTRODUODENOSCOPY (EGD) WITH PROPOFOL;  Surgeon: Christena Deem, MD;  Location: [facility] ENDOSCOPY;  Service: Endoscopy;  Laterality: N/A;   EYE SURGERY     HERNIA REPAIR     kidney tumor remove     TRANSURETHRAL RESECTION OF BLADDER TUMOR WITH GYRUS (TURBT-GYRUS)  12/2013   UMBILICAL HERNIA REPAIR     urethral meatotomy     Patient Active Problem List   Diagnosis Date Noted   Class 2 obesity due to excess calories with body mass index (BMI) of 36.0 to 36.9 in adult 04/11/2020   Swelling of limb 03/28/2020   Lymphedema 03/28/2020   Trigeminal neuralgia 12/25/2019   Lower limb ulcer, calf, left, limited to breakdown of skin (HCC) 12/25/2019   OSA (obstructive sleep apnea) 01/23/2019   Acute systolic CHF (congestive heart failure) (HCC) 12/12/2018   Bruising 07/10/2018   Diet-controlled type 2 diabetes mellitus (HCC) 03/24/2018   Microalbuminuria 03/24/2018   Dizziness 11/17/2017   Simple chronic bronchitis (HCC) 09/22/2017   SOBOE (shortness of breath on exertion) 02/18/2017   Chronic midline low back pain without sciatica 12/29/2016   Periodic limb movement disorder 12/29/2016   Benign essential tremor 06/22/2016   Pure hypercholesterolemia 06/20/2015   Erectile dysfunction due to arterial insufficiency 06/16/2015   Unstable angina (HCC) 01/22/2015   Abdominal aortic 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 rupture (HCC) 12/31/2014   Benign prostatic hyperplasia without urinary obstruction 07/31/2014   Enlarged prostate 07/31/2014   Benign essential HTN 06/11/2014   Carotid artery narrowing 02/08/2014   Carotid artery obstruction 02/08/2014   Bilateral carotid artery stenosis 02/08/2014   Chest pain 08/20/2013   Bilateral cataracts 05/16/2013   Cataract 05/16/2013   Clinical depression 12/12/2012   Diabetes mellitus, type 2 (HCC) 12/12/2012   Combined fat and carbohydrate induced hyperlipemia 12/12/2012   Major depressive disorder,  single episode, unspecified 12/12/2012   Acid reflux 11/09/2012   Presence of stent in coronary artery 11/09/2012   BP (high blood pressure) 11/09/2012   Healed myocardial infarct 11/09/2012   Gastro-esophageal reflux disease without esophagitis 11/09/2012   History of cardiovascular surgery 11/09/2012   Fothergill's neuralgia 08/07/2012   Swelling of testicle 04/06/2012   Disorder of male genital organ 04/06/2012   Swelling of the testicles 04/06/2012   Arteriosclerosis of coronary artery 11/16/2011   CAD in native artery 11/16/2011   Fatigue 06/04/2011   Avitaminosis D 11/30/2010    ONSET DATE: 03/17/2022- Worsening symptoms  REFERRING DIAG:  G20.A1 (ICD-10-CM) - Parkinsons  R26.2 (ICD-10-CM) - Difficulty walking    THERAPY DIAG:  Difficulty in walking, not elsewhere classified  Muscle weakness (generalized)  Abnormality of gait and mobility  Unsteadiness on feet  Other  abnormalities of gait and mobility  Other lack of coordination  Chronic bilateral low back pain without sciatica  Rationale for Evaluation and Treatment: Rehabilitation  SUBJECTIVE:                                                                                                                                                                                             SUBJECTIVE STATEMENT:  Patient reports doing better- Reports received injection into right hip with good relief and denied any pain today.   Patient reports 0/10 low back pain or RLE Pain.    Pt accompanied by: significant other  PERTINENT HISTORY: Patient is a 77 year old male with referral to outpatient PT for Parkinsons and difficulty walking. He was recently seen by Neurology worsening symptoms of parkinson- difficulty with walking. He has past medical history of Arthritis, Bladder cancer, and Trigeminal Neuralgia.  PAIN:  Are you having pain? NO  PRECAUTIONS: Fall  WEIGHT BEARING RESTRICTIONS: No  FALLS: Has patient  fallen in last 6 months? Yes. Number of falls 1  LIVING ENVIRONMENT: Lives with: lives with their spouse Lives in: House/apartment Stairs: Yes: External: 2 steps; has grab bar Has following equipment at home: Dan Humphreys - 2 wheeled, Environmental consultant - 4 wheeled, and Grab bars  PLOF: Independent with household mobility with device, Independent with community mobility with device, Independent with transfers, and Requires assistive device for independence  PATIENT GOALS: Get to where I can walk better again and get my legs stronger.   OBJECTIVE:    TODAY'S TREATMENT:                                                                                                                              DATE: 12/27/22    THEREX-   Lumbar stretch- forward lean in chair- repetitive- 2 x 10 reps   Seated hip march 3# AW 3 sets of 10 reps (VC for controlled motion)  Resistive gait 3# and Rollator- x approx 160 feet-  short reciprocal steps with VC to pick his feet up  Seated LAQ 3#  3 sets of 10 reps  Resistive gait 3# and Rollator- x approx 160 feet-  short reciprocal steps with Denied low back pain and stated "a lil tired"  Seated ham curls with GTB 2 sets of 10 reps  Seated horizontal Shoulder ABD- GTB 2 sets of 10 reps   Seated calf raises on incline 2 sets of 10 reps BLE  Dynamic walking in clinic with rollator- approx 160 Feet     PATIENT EDUCATION: Education details: Exercise technique Person educated: Patient Education method: Explanation, Demonstration, Tactile cues, Verbal cues, and Handouts Education comprehension: verbalized understanding, returned demonstration, verbal cues required, tactile cues required, and needs further education  HOME EXERCISE PROGRAM: Access Code: [email or web address] URL: [email or web address] Date: 08/11/2022 Prepared by: Maureen Ralphs  Exercises - Seated Lumbar Flexion Stretch  - 7 x weekly - 3 sets - 30 sec hold - Seated Thoracic Flexion and Rotation  with Arms Crossed  - 1 x daily - 7 x weekly - 3 sets - 30 sec hold - Seated Figure 4 Piriformis Stretch  - 1 x daily - 7 x weekly - 3 sets - 10 reps  GOALS: Goals reviewed with patient? Yes  SHORT TERM GOALS: Target date: 09/20/2022  Pt will be independent with a comprehensive HEP in order to improve strength and balance in order to decrease fall risk and improve function at home and work.  Baseline: EVAL: Patient admits not performing much exercises - not moving around well. 10/25/2022-Patient reports not as compliant with previous HEP as instructed- Verbally reviewed Low back, cervical, thoracic stretching and importance of daily walking. 11/03/2022= Patient reports independent with cervical Stretches and some walking at home Goal status: MET   LONG TERM GOALS: Target date: 01/26/2023  Pt will improve FOTO to target score of 45 % to display perceived improvements in ability to complete ADL's.  Baseline: EVAL: 42: 11/03/2022= 55 Goal status: MET  2.  Pt will decrease 5TSTS by at least 5 seconds in order to demonstrate clinically significant improvement in LE strength.  Baseline: EVAL = 17.39 sec without UE support; 10/25/2022= 12.91 sec Goal status: PROGRESSING  3. Pt will increase by at least 0.15 m/s in order to demonstrate clinically significant improvement in community ambulation   Baseline: EVAL= 0.67 m/s; 10/25/2022= 0.77 m/s Goal status: PROGRESSING  4.  Pt will improve BERG by at least 3 points in order to demonstrate clinically significant improvement in balance.   Baseline: EVAL: to be assessed next visit: 08/23/2022= 36/56; 11/03/2022= 40/56 Goal status: MET  5.  Pt will decrease TUG to below 14 seconds/decrease in order to demonstrate decreased fall risk. Baseline: EVAL: 22.00 sec with upright 4WW; 10/25/2022= 15.98 sec with 4WW Goal status: PROGRESSING  6.  Pt will increase by at least 8m (195ft) in order to demonstrate clinically significant improvement in  cardiopulmonary endurance and community ambulation  Baseline: EVAL: 3:45 min (450 feet with upright 4WW) 10/25/2022= 830 feet with 4WW Goal status: MET  7.   Patient will report returning to walking in to community places using most appropriate assistive device vs current use of scooter for improved abilities with mobility during community outings Baseline: EVAL: Current using scooter for many social/community outings; 10/25/2022- Paitent continues to scooter for long distance community distances- due to some fatigue or SOB.  Goal status: ONGOING  8.  Pt will increase > 1000 feet in order to demonstrate clinically significant improvement in cardiopulmonary endurance and community ambulation   Baseline: 10/25/2022= 830 feet with  1OX  Goal status= NEW  9.  Pt will improve BERG by at least 3 points in order to demonstrate clinically significant improvement in balance.   Baseline: 11/03/2022= 40/56 Goal status: NEW  ASSESSMENT:  CLINICAL IMPRESSION: Treatment modified secondary to patient with recent injection to right Hip last week. He was able to perform all LE strengthening without report of increased pain today. He was responsive to all seated therex and able to complete 2-3 sets with some resistance. He cited fatigue with increased reps/activities but again reported no pain today.  Pt will continue to benefit from skilled PT services to address deficits and impairment identified in evaluation in order to maximize independence and safety in basic mobility required for performance of ADL, IADL, and leisure.    OBJECTIVE IMPAIRMENTS: Abnormal gait, cardiopulmonary status limiting activity, decreased activity tolerance, decreased balance, decreased coordination, decreased endurance, decreased mobility, difficulty walking, decreased ROM, decreased strength, hypomobility, impaired flexibility, impaired sensation, impaired UE functional use, postural dysfunction, obesity, and pain.   ACTIVITY  LIMITATIONS: carrying, lifting, bending, standing, squatting, stairs, transfers, continence, and reach over head  PARTICIPATION LIMITATIONS: meal prep, cleaning, laundry, driving, shopping, community activity, and yard work  PERSONAL FACTORS: Age and 3+ comorbidities: HTN, Arthritis, Low back pain, Trigeminal Neuralgia  are also affecting patient's functional outcome.   REHAB POTENTIAL: Good  CLINICAL DECISION MAKING: Evolving/moderate complexity  EVALUATION COMPLEXITY: Moderate  PLAN:  PT FREQUENCY: 1-2x/week  PT DURATION: 12 weeks  PLANNED INTERVENTIONS: Therapeutic exercises, Therapeutic activity, Neuromuscular re-education, Balance training, Gait training, Patient/Family education, Self Care, Joint mobilization, Stair training, Vestibular training, Canalith repositioning, DME instructions, Dry Needling, Electrical stimulation, Spinal manipulation, Spinal mobilization, Cryotherapy, Moist heat, Manual therapy, and Re-evaluation  PLAN FOR NEXT SESSION: Progress therex, bed mobility, and balance training. Continue with  training in Well zone for UE/LE strengthening and cardiovascular training. Try to retest goals next visit.    Lenda Kelp, PT 12/27/2022, 2:09 PM  2:09 PM, 12/27/22  Physical Therapist - [facility]  Outpatient Physical Therapy- Main Campus [phone]

## 2022-12-29 ENCOUNTER — Ambulatory Visit: Payer: Medicare HMO

## 2022-12-29 DIAGNOSIS — R278 Other lack of coordination: Secondary | ICD-10-CM | POA: Diagnosis not present

## 2022-12-29 DIAGNOSIS — R2681 Unsteadiness on feet: Secondary | ICD-10-CM | POA: Diagnosis not present

## 2022-12-29 DIAGNOSIS — G8929 Other chronic pain: Secondary | ICD-10-CM | POA: Diagnosis not present

## 2022-12-29 DIAGNOSIS — M6281 Muscle weakness (generalized): Secondary | ICD-10-CM | POA: Diagnosis not present

## 2022-12-29 DIAGNOSIS — R269 Unspecified abnormalities of gait and mobility: Secondary | ICD-10-CM | POA: Diagnosis not present

## 2022-12-29 DIAGNOSIS — M545 Low back pain, unspecified: Secondary | ICD-10-CM | POA: Diagnosis not present

## 2022-12-29 DIAGNOSIS — R262 Difficulty in walking, not elsewhere classified: Secondary | ICD-10-CM

## 2022-12-29 DIAGNOSIS — R2689 Other abnormalities of gait and mobility: Secondary | ICD-10-CM

## 2022-12-29 NOTE — Therapy (Signed)
OUTPATIENT PHYSICAL THERAPY NEURO TREATMENT    Patient Name: [name] MRN: [id number] DOB:[birth year], 77 y.o., male Today's Date: 12/29/2022   PCP: Ardyth Man, PA- C REFERRING PROVIDER: Morene Crocker, MD  END OF SESSION:  PT End of Session - 12/29/22 1103     Visit Number 34    Number of Visits 46    Date for PT Re-Evaluation 01/26/23    Authorization Type [employer]    Progress Note Due on Visit 40    PT Start Time 1103    PT Stop Time 1142    PT Time Calculation (min) 39 min    Equipment Utilized During Treatment Gait belt    Activity Tolerance Patient limited by pain    Behavior During Therapy WFL for tasks assessed/performed                            Past Medical History:  Diagnosis Date   Anxiety    Arteriosclerosis of coronary artery 11/16/2011   Overview:  Stent 10/2011 stent rca 2015 with collaterals to lad which is chronically occluded    Benign enlargement of prostate    Benign essential HTN 06/11/2014   Benign prostatic hypertrophy without urinary obstruction 07/31/2014   Bilateral cataracts 05/16/2013   Overview:  [facility]     Bone spur of foot    Left   BP (high blood pressure) 11/09/2012   Cancer (HCC)    skin (forehead) and bladder   Carotid artery narrowing 02/08/2014   Depression    Detrusor hypertrophy    Diabetes (HCC)    Diabetes mellitus, type 2 (HCC) 12/12/2012   Diverticulosis    Dyspnea    Esophageal reflux    Esophageal reflux    Fothergill's neuralgia 08/07/2012   Overview:  [facility] Neurology    Gastritis    GERD (gastroesophageal reflux disease)    Headache    cluster headaches   Healed myocardial infarct 11/09/2012   Hearing loss in left ear    Heart disease    Hematuria    Hemorrhoids    History of hiatal hernia 12/14/2017   small    Hypercholesteremia    Lesion of bladder    Myocardial infarct (HCC)    Presence of stent in coronary artery 11/09/2012   Rectal bleeding     Trigeminal neuralgia    Trigeminal neuralgia    Valvular heart disease    Vitamin D deficiency    Past Surgical History:  Procedure Laterality Date   APPENDECTOMY     BOTOX INJECTION N/A 12/21/2017   Procedure: Bladder BOTOX INJECTION;  Surgeon: Vanna Scotland, MD;  Location: [facility] ORS;  Service: Urology;  Laterality: N/A;   BOTOX INJECTION N/A 09/11/2018   Procedure: Bladder BOTOX INJECTION;  Surgeon: Vanna Scotland, MD;  Location: [facility] ORS;  Service: Urology;  Laterality: N/A;   CARDIAC CATHETERIZATION     CARDIAC CATHETERIZATION N/A 01/22/2015   Procedure: Left Heart Cath;  Surgeon: Lamar Blinks, MD;  Location: [facility] INVASIVE CV LAB;  Service: Cardiovascular;  Laterality: N/A;   CARDIAC CATHETERIZATION N/A 01/22/2015   Procedure: Coronary Stent Intervention;  Surgeon: Marcina Millard, MD;  Location: [facility] INVASIVE CV LAB;  Service: Cardiovascular;  Laterality: N/A;   CATARACT EXTRACTION, BILATERAL     COLONOSCOPY WITH PROPOFOL N/A 12/08/2015   Procedure: COLONOSCOPY WITH PROPOFOL;  Surgeon: Christena Deem, MD;  Location: [facility] ENDOSCOPY;  Service: Endoscopy;  Laterality: N/A;  COLONOSCOPY WITH PROPOFOL N/A 12/09/2015   Procedure: COLONOSCOPY WITH PROPOFOL;  Surgeon: Christena Deem, MD;  Location: [facility] ENDOSCOPY;  Service: Endoscopy;  Laterality: N/A;   CORONARY ANGIOPLASTY     5 stents   CORONARY STENT PLACEMENT  2015   x5   CYSTOSCOPY N/A 09/11/2018   Procedure: CYSTOSCOPY;  Surgeon: Vanna Scotland, MD;  Location: [facility] ORS;  Service: Urology;  Laterality: N/A;   CYSTOSCOPY WITH BIOPSY N/A 12/21/2017   Procedure: CYSTOSCOPY WITH BIOPSY;  Surgeon: Vanna Scotland, MD;  Location: [facility] ORS;  Service: Urology;  Laterality: N/A;   ESOPHAGOGASTRODUODENOSCOPY (EGD) WITH PROPOFOL N/A 12/08/2015   Procedure: ESOPHAGOGASTRODUODENOSCOPY (EGD) WITH PROPOFOL;  Surgeon: Christena Deem, MD;  Location: [facility] ENDOSCOPY;  Service: Endoscopy;  Laterality: N/A;   ESOPHAGOGASTRODUODENOSCOPY  (EGD) WITH PROPOFOL N/A 12/13/2017   Procedure: ESOPHAGOGASTRODUODENOSCOPY (EGD) WITH PROPOFOL;  Surgeon: Christena Deem, MD;  Location: [facility] ENDOSCOPY;  Service: Endoscopy;  Laterality: N/A;   EYE SURGERY     HERNIA REPAIR     kidney tumor remove     TRANSURETHRAL RESECTION OF BLADDER TUMOR WITH GYRUS (TURBT-GYRUS)  12/2013   UMBILICAL HERNIA REPAIR     urethral meatotomy     Patient Active Problem List   Diagnosis Date Noted   Class 2 obesity due to excess calories with body mass index (BMI) of 36.0 to 36.9 in adult 04/11/2020   Swelling of limb 03/28/2020   Lymphedema 03/28/2020   Trigeminal neuralgia 12/25/2019   Lower limb ulcer, calf, left, limited to breakdown of skin (HCC) 12/25/2019   OSA (obstructive sleep apnea) 01/23/2019   Acute systolic CHF (congestive heart failure) (HCC) 12/12/2018   Bruising 07/10/2018   Diet-controlled type 2 diabetes mellitus (HCC) 03/24/2018   Microalbuminuria 03/24/2018   Dizziness 11/17/2017   Simple chronic bronchitis (HCC) 09/22/2017   SOBOE (shortness of breath on exertion) 02/18/2017   Chronic midline low back pain without sciatica 12/29/2016   Periodic limb movement disorder 12/29/2016   Benign essential tremor 06/22/2016   Pure hypercholesterolemia 06/20/2015   Erectile dysfunction due to arterial insufficiency 06/16/2015   Unstable angina (HCC) 01/22/2015   Abdominal aortic 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 rupture (HCC) 12/31/2014   Benign prostatic hyperplasia without urinary obstruction 07/31/2014   Enlarged prostate 07/31/2014   Benign essential HTN 06/11/2014   Carotid artery narrowing 02/08/2014   Carotid artery obstruction 02/08/2014   Bilateral carotid artery stenosis 02/08/2014   Chest pain 08/20/2013   Bilateral cataracts 05/16/2013   Cataract 05/16/2013   Clinical depression 12/12/2012   Diabetes mellitus, type 2 (HCC) 12/12/2012   Combined fat and carbohydrate induced hyperlipemia 12/12/2012   Major depressive disorder,  single episode, unspecified 12/12/2012   Acid reflux 11/09/2012   Presence of stent in coronary artery 11/09/2012   BP (high blood pressure) 11/09/2012   Healed myocardial infarct 11/09/2012   Gastro-esophageal reflux disease without esophagitis 11/09/2012   History of cardiovascular surgery 11/09/2012   Fothergill's neuralgia 08/07/2012   Swelling of testicle 04/06/2012   Disorder of male genital organ 04/06/2012   Swelling of the testicles 04/06/2012   Arteriosclerosis of coronary artery 11/16/2011   CAD in native artery 11/16/2011   Fatigue 06/04/2011   Avitaminosis D 11/30/2010    ONSET DATE: 03/17/2022- Worsening symptoms  REFERRING DIAG:  G20.A1 (ICD-10-CM) - Parkinsons  R26.2 (ICD-10-CM) - Difficulty walking    THERAPY DIAG:  Muscle weakness (generalized)  Other abnormalities of gait and mobility  Unsteadiness on feet  Difficulty in walking, not elsewhere classified  Abnormality of gait and mobility  Other lack of coordination  Rationale for Evaluation and Treatment: Rehabilitation  SUBJECTIVE:                                                                                                                                                                                             SUBJECTIVE STATEMENT:  Pt reported no medical changes or falls since last visit. Pt reported 0/10 on NPS.    Pt accompanied by: significant other  PERTINENT HISTORY: Patient is a 77 year old male with referral to outpatient PT for Parkinsons and difficulty walking. He was recently seen by Neurology worsening symptoms of parkinson- difficulty with walking. He has past medical history of Arthritis, Bladder cancer, and Trigeminal Neuralgia.  PAIN:  Are you having pain? NO  PRECAUTIONS: Fall  WEIGHT BEARING RESTRICTIONS: No  FALLS: Has patient fallen in last 6 months? Yes. Number of falls 1  LIVING ENVIRONMENT: Lives with: lives with their spouse Lives in:  House/apartment Stairs: Yes: External: 2 steps; has grab bar Has following equipment at home: Dan Humphreys - 2 wheeled, Environmental consultant - 4 wheeled, and Grab bars  PLOF: Independent with household mobility with device, Independent with community mobility with device, Independent with transfers, and Requires assistive device for independence  PATIENT GOALS: Get to where I can walk better again and get my legs stronger.   OBJECTIVE:    TODAY'S TREATMENT:                                                                                                                              DATE: 12/29/22    THEREX-   Lumbar stretch- forward lean in chair- repetitive- 2 x 10 reps   Seated hip march 4# AW 2 sets of 10 reps - Each LE  Seated LAQ 4# AW 2 sets of 10 reps Each LE  Seated hamstring curls with GTB 3 sets of 8 reps Each LE  Seated horizontal Shoulder ABD- GTB 3 sets of 8 reps   Seated calf raises on incline 3 sets of 8 reps BLE  Ambulation 2 rounds of 300 feet with 4# AW with rollator   Seated gluteal press GTB 3x8      PATIENT EDUCATION: Education details: Exercise technique Person educated: Patient Education method: Explanation, Demonstration, Tactile cues, Verbal cues, and Handouts Education comprehension: verbalized understanding, returned demonstration, verbal cues required, tactile cues required, and needs further education  HOME EXERCISE PROGRAM: Access Code: [email or web address] URL: [email or web address] Date: 08/11/2022 Prepared by: Maureen Ralphs  Exercises - Seated Lumbar Flexion Stretch  - 7 x weekly - 3 sets - 30 sec hold - Seated Thoracic Flexion and Rotation with Arms Crossed  - 1 x daily - 7 x weekly - 3 sets - 30 sec hold - Seated Figure 4 Piriformis Stretch  - 1 x daily - 7 x weekly - 3 sets - 10 reps  GOALS: Goals reviewed with patient? Yes  SHORT TERM GOALS: Target date: 09/20/2022  Pt will be independent with a comprehensive HEP in order to improve  strength and balance in order to decrease fall risk and improve function at home and work.  Baseline: EVAL: Patient admits not performing much exercises - not moving around well. 10/25/2022-Patient reports not as compliant with previous HEP as instructed- Verbally reviewed Low back, cervical, thoracic stretching and importance of daily walking. 11/03/2022= Patient reports independent with cervical Stretches and some walking at home Goal status: MET   LONG TERM GOALS: Target date: 01/26/2023  Pt will improve FOTO to target score of 45 % to display perceived improvements in ability to complete ADL's.  Baseline: EVAL: 42: 11/03/2022= 55 Goal status: MET  2.  Pt will decrease 5TSTS by at least 5 seconds in order to demonstrate clinically significant improvement in LE strength.  Baseline: EVAL = 17.39 sec without UE support; 10/25/2022= 12.91 sec Goal status: PROGRESSING  3. Pt will increase by at least 0.15 m/s in order to demonstrate clinically significant improvement in community ambulation   Baseline: EVAL= 0.67 m/s; 10/25/2022= 0.77 m/s Goal status: PROGRESSING  4.  Pt will improve BERG by at least 3 points in order to demonstrate clinically significant improvement in balance.   Baseline: EVAL: to be assessed next visit: 08/23/2022= 36/56; 11/03/2022= 40/56 Goal status: MET  5.  Pt will decrease TUG to below 14 seconds/decrease in order to demonstrate decreased fall risk. Baseline: EVAL: 22.00 sec with upright 4WW; 10/25/2022= 15.98 sec with 4WW Goal status: PROGRESSING  6.  Pt will increase by at least 87m (139ft) in order to demonstrate clinically significant improvement in cardiopulmonary endurance and community ambulation  Baseline: EVAL: 3:45 min (450 feet with upright 4WW) 10/25/2022= 830 feet with 4WW Goal status: MET  7.   Patient will report returning to walking in to community places using most appropriate assistive device vs current use of scooter for improved abilities with  mobility during community outings Baseline: EVAL: Current using scooter for many social/community outings; 10/25/2022- Paitent continues to scooter for long distance community distances- due to some fatigue or SOB.  Goal status: ONGOING  8.  Pt will increase > 1000 feet in order to demonstrate clinically significant improvement in cardiopulmonary endurance and community ambulation   Baseline: 10/25/2022= 830 feet with 4WW  Goal status= NEW  9.  Pt will improve BERG by at least 3 points in order to demonstrate clinically significant improvement in balance.   Baseline: 11/03/2022= 40/56 Goal status: NEW  ASSESSMENT:  CLINICAL IMPRESSION: Pt responded well to treatment today. Pt reported no signs of pain throughout today's session and  was able to tolerate all tasks. Pt reported fatigue towards the end of today's session, which was expected due to increased intensity and repetitions of some tasks from last session. Despite signs of fatigue, pt was motivated to complete all tasks during today's session. Pt will continue to benefit from skilled PT services to address deficits and impairment identified in evaluation in order to maximize independence and safety in basic mobility required for performance of ADL, IADL, and leisure.    OBJECTIVE IMPAIRMENTS: Abnormal gait, cardiopulmonary status limiting activity, decreased activity tolerance, decreased balance, decreased coordination, decreased endurance, decreased mobility, difficulty walking, decreased ROM, decreased strength, hypomobility, impaired flexibility, impaired sensation, impaired UE functional use, postural dysfunction, obesity, and pain.   ACTIVITY LIMITATIONS: carrying, lifting, bending, standing, squatting, stairs, transfers, continence, and reach over head  PARTICIPATION LIMITATIONS: meal prep, cleaning, laundry, driving, shopping, community activity, and yard work  PERSONAL FACTORS: Age and 3+ comorbidities: HTN, Arthritis, Low back  pain, Trigeminal Neuralgia  are also affecting patient's functional outcome.   REHAB POTENTIAL: Good  CLINICAL DECISION MAKING: Evolving/moderate complexity  EVALUATION COMPLEXITY: Moderate  PLAN:  PT FREQUENCY: 1-2x/week  PT DURATION: 12 weeks  PLANNED INTERVENTIONS: Therapeutic exercises, Therapeutic activity, Neuromuscular re-education, Balance training, Gait training, Patient/Family education, Self Care, Joint mobilization, Stair training, Vestibular training, Canalith repositioning, DME instructions, Dry Needling, Electrical stimulation, Spinal manipulation, Spinal mobilization, Cryotherapy, Moist heat, Manual therapy, and Re-evaluation  PLAN FOR NEXT SESSION: Progress therex, bed mobility, and balance training. Continue with  training in Well zone for UE/LE strengthening and cardiovascular training. Try to retest goals next visit.    Debara Pickett, Student-PT 12/29/2022, 12:08 PM  This entire session was performed under direct supervision and direction of a licensed therapist/therapist assistant . I have personally read, edited and approve of the note as written.    12:08 PM, 12/29/22 Louis Meckel, PT Physical Therapist - [facility]  Outpatient Physical Therapy- Main Campus [phone]

## 2023-01-03 ENCOUNTER — Ambulatory Visit: Payer: Medicare HMO

## 2023-01-05 ENCOUNTER — Ambulatory Visit: Payer: Medicare HMO

## 2023-01-10 ENCOUNTER — Ambulatory Visit: Payer: Medicare HMO

## 2023-01-12 ENCOUNTER — Ambulatory Visit: Payer: Medicare HMO

## 2023-01-12 DIAGNOSIS — M5416 Radiculopathy, lumbar region: Secondary | ICD-10-CM | POA: Diagnosis not present

## 2023-01-17 ENCOUNTER — Ambulatory Visit: Payer: Medicare HMO | Attending: Neurology

## 2023-01-17 DIAGNOSIS — R262 Difficulty in walking, not elsewhere classified: Secondary | ICD-10-CM

## 2023-01-17 DIAGNOSIS — M545 Low back pain, unspecified: Secondary | ICD-10-CM | POA: Diagnosis not present

## 2023-01-17 DIAGNOSIS — R269 Unspecified abnormalities of gait and mobility: Secondary | ICD-10-CM | POA: Diagnosis not present

## 2023-01-17 DIAGNOSIS — M6281 Muscle weakness (generalized): Secondary | ICD-10-CM | POA: Diagnosis not present

## 2023-01-17 DIAGNOSIS — R2689 Other abnormalities of gait and mobility: Secondary | ICD-10-CM

## 2023-01-17 DIAGNOSIS — G8929 Other chronic pain: Secondary | ICD-10-CM | POA: Diagnosis not present

## 2023-01-17 DIAGNOSIS — R278 Other lack of coordination: Secondary | ICD-10-CM | POA: Diagnosis not present

## 2023-01-17 DIAGNOSIS — R2681 Unsteadiness on feet: Secondary | ICD-10-CM | POA: Diagnosis not present

## 2023-01-17 NOTE — Therapy (Unsigned)
OUTPATIENT PHYSICAL THERAPY NEURO TREATMENT    Patient Name: [name] MRN: [id number] DOB:01-31-1946, 77 y.o., male Today's Date: 01/17/2023   PCP: Ardyth Man, PA- C REFERRING PROVIDER: Morene Crocker, MD  END OF SESSION:  PT End of Session - 01/17/23 1102     Visit Number 35    Number of Visits 46    Date for PT Re-Evaluation 01/26/23    Authorization Type [employer]    Progress Note Due on Visit 40    PT Start Time 1103    PT Stop Time 1142    PT Time Calculation (min) 39 min    Equipment Utilized During Treatment Gait belt    Activity Tolerance Patient limited by pain    Behavior During Therapy WFL for tasks assessed/performed                            Past Medical History:  Diagnosis Date   Anxiety    Arteriosclerosis of coronary artery 11/16/2011   Overview:  Stent 10/2011 stent rca 2015 with collaterals to lad which is chronically occluded    Benign enlargement of prostate    Benign essential HTN 06/11/2014   Benign prostatic hypertrophy without urinary obstruction 07/31/2014   Bilateral cataracts 05/16/2013   Overview:  [facility]     Bone spur of foot    Left   BP (high blood pressure) 11/09/2012   Cancer (HCC)    skin (forehead) and bladder   Carotid artery narrowing 02/08/2014   Depression    Detrusor hypertrophy    Diabetes (HCC)    Diabetes mellitus, type 2 (HCC) 12/12/2012   Diverticulosis    Dyspnea    Esophageal reflux    Esophageal reflux    Fothergill's neuralgia 08/07/2012   Overview:  [facility]    Gastritis    GERD (gastroesophageal reflux disease)    Headache    cluster headaches   Healed myocardial infarct 11/09/2012   Hearing loss in left ear    Heart disease    Hematuria    Hemorrhoids    History of hiatal hernia 12/14/2017   small    Hypercholesteremia    Lesion of bladder    Myocardial infarct (HCC)    Presence of stent in coronary artery 11/09/2012   Rectal bleeding     Trigeminal neuralgia    Trigeminal neuralgia    Valvular heart disease    Vitamin D deficiency    Past Surgical History:  Procedure Laterality Date   APPENDECTOMY     BOTOX INJECTION N/A 12/21/2017   Procedure: Bladder BOTOX INJECTION;  Surgeon: Vanna Scotland, MD;  Location: [facility] ORS;  Service: Urology;  Laterality: N/A;   BOTOX INJECTION N/A 09/11/2018   Procedure: Bladder BOTOX INJECTION;  Surgeon: Vanna Scotland, MD;  Location: [facility];  Service: Urology;  Laterality: N/A;   CARDIAC CATHETERIZATION     CARDIAC CATHETERIZATION N/A 01/22/2015   Procedure: Left Heart Cath;  Surgeon: Lamar Blinks, MD;  Location: [facility] INVASIVE CV LAB;  Service: Cardiovascular;  Laterality: N/A;   CARDIAC CATHETERIZATION N/A 01/22/2015   Procedure: Coronary Stent Intervention;  Surgeon: Marcina Millard, MD;  Location: [facility] INVASIVE CV LAB;  Service: Cardiovascular;  Laterality: N/A;   CATARACT EXTRACTION, BILATERAL     COLONOSCOPY WITH PROPOFOL N/A 12/08/2015   Procedure: COLONOSCOPY WITH PROPOFOL;  Surgeon: Christena Deem, MD;  Location: [facility] ENDOSCOPY;  Service: Endoscopy;  Laterality: N/A;  COLONOSCOPY WITH PROPOFOL N/A 12/09/2015   Procedure: COLONOSCOPY WITH PROPOFOL;  Surgeon: Christena Deem, MD;  Location: [facility] ENDOSCOPY;  Service: Endoscopy;  Laterality: N/A;   CORONARY ANGIOPLASTY     5 stents   CORONARY STENT PLACEMENT  2015   x5   CYSTOSCOPY N/A 09/11/2018   Procedure: CYSTOSCOPY;  Surgeon: Vanna Scotland, MD;  Location: [facility] ORS;  Service: Urology;  Laterality: N/A;   CYSTOSCOPY WITH BIOPSY N/A 12/21/2017   Procedure: CYSTOSCOPY WITH BIOPSY;  Surgeon: Vanna Scotland, MD;  Location: [facility] ORS;  Service: Urology;  Laterality: N/A;   ESOPHAGOGASTRODUODENOSCOPY (EGD) WITH PROPOFOL N/A 12/08/2015   Procedure: ESOPHAGOGASTRODUODENOSCOPY (EGD) WITH PROPOFOL;  Surgeon: Christena Deem, MD;  Location: [facility] ENDOSCOPY;  Service: Endoscopy;  Laterality: N/A;   ESOPHAGOGASTRODUODENOSCOPY  (EGD) WITH PROPOFOL N/A 12/13/2017   Procedure: ESOPHAGOGASTRODUODENOSCOPY (EGD) WITH PROPOFOL;  Surgeon: Christena Deem, MD;  Location: [facility] ENDOSCOPY;  Service: Endoscopy;  Laterality: N/A;   EYE SURGERY     HERNIA REPAIR     kidney tumor remove     TRANSURETHRAL RESECTION OF BLADDER TUMOR WITH GYRUS (TURBT-GYRUS)  12/2013   UMBILICAL HERNIA REPAIR     urethral meatotomy     Patient Active Problem List   Diagnosis Date Noted   Class 2 obesity due to excess calories with body mass index (BMI) of 36.0 to 36.9 in adult 04/11/2020   Swelling of limb 03/28/2020   Lymphedema 03/28/2020   Trigeminal neuralgia 12/25/2019   Lower limb ulcer, calf, left, limited to breakdown of skin (HCC) 12/25/2019   OSA (obstructive sleep apnea) 01/23/2019   Acute systolic CHF (congestive heart failure) (HCC) 12/12/2018   Bruising 07/10/2018   Diet-controlled type 2 diabetes mellitus (HCC) 03/24/2018   Microalbuminuria 03/24/2018   Dizziness 11/17/2017   Simple chronic bronchitis (HCC) 09/22/2017   SOBOE (shortness of breath on exertion) 02/18/2017   Chronic midline low back pain without sciatica 12/29/2016   Periodic limb movement disorder 12/29/2016   Benign essential tremor 06/22/2016   Pure hypercholesterolemia 06/20/2015   Erectile dysfunction due to arterial insufficiency 06/16/2015   Unstable angina (HCC) 01/22/2015   Abdominal aortic 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 rupture (HCC) 12/31/2014   Benign prostatic hyperplasia without urinary obstruction 07/31/2014   Enlarged prostate 07/31/2014   Benign essential HTN 06/11/2014   Carotid artery narrowing 02/08/2014   Carotid artery obstruction 02/08/2014   Bilateral carotid artery stenosis 02/08/2014   Chest pain 08/20/2013   Bilateral cataracts 05/16/2013   Cataract 05/16/2013   Clinical depression 12/12/2012   Diabetes mellitus, type 2 (HCC) 12/12/2012   Combined fat and carbohydrate induced hyperlipemia 12/12/2012   Major depressive disorder,  single episode, unspecified 12/12/2012   Acid reflux 11/09/2012   Presence of stent in coronary artery 11/09/2012   BP (high blood pressure) 11/09/2012   Healed myocardial infarct 11/09/2012   Gastro-esophageal reflux disease without esophagitis 11/09/2012   History of cardiovascular surgery 11/09/2012   Fothergill's neuralgia 08/07/2012   Swelling of testicle 04/06/2012   Disorder of male genital organ 04/06/2012   Swelling of the testicles 04/06/2012   Arteriosclerosis of coronary artery 11/16/2011   CAD in native artery 11/16/2011   Fatigue 06/04/2011   Avitaminosis D 11/30/2010    ONSET DATE: 03/17/2022- Worsening symptoms  REFERRING DIAG:  G20.A1 (ICD-10-CM) - Parkinsons  R26.2 (ICD-10-CM) - Difficulty walking    THERAPY DIAG:  Muscle weakness (generalized)  Difficulty in walking, not elsewhere classified  Other abnormalities of gait and mobility  Abnormality of gait and  mobility  Unsteadiness on feet  Other lack of coordination  Chronic bilateral low back pain without sciatica  Rationale for Evaluation and Treatment: Rehabilitation  SUBJECTIVE:                                                                                                                                                                                             SUBJECTIVE STATEMENT:  Pt stated that he received an epidural steroid injection on 01/12/2023 for low back pain. Pt reported that he is feeling much better since injection. Pt reported 0/10 on NPS.    Pt accompanied by: significant other  PERTINENT HISTORY: Patient is a 77 year old male with referral to outpatient PT for Parkinsons and difficulty walking. He was recently seen by Neurology worsening symptoms of parkinson- difficulty with walking. He has past medical history of Arthritis, Bladder cancer, and Trigeminal Neuralgia.  PAIN:  Are you having pain? NO  PRECAUTIONS: Fall  WEIGHT BEARING RESTRICTIONS: No  FALLS: Has  patient fallen in last 6 months? Yes. Number of falls 1  LIVING ENVIRONMENT: Lives with: lives with their spouse Lives in: House/apartment Stairs: Yes: External: 2 steps; has grab bar Has following equipment at home: Dan Humphreys - 2 wheeled, Environmental consultant - 4 wheeled, and Grab bars  PLOF: Independent with household mobility with device, Independent with community mobility with device, Independent with transfers, and Requires assistive device for independence  PATIENT GOALS: Get to where I can walk better again and get my legs stronger.   OBJECTIVE:    TODAY'S TREATMENT:                                                                                                                              DATE: 01/17/23    THEREX-   Lumbar stretch- forward lean in chair- repetitive- 2 x 10 reps   Seated hip march 4# AW 2 sets of 10 reps - Each LE  Seated LAQ 4# AW 2 sets of 10 reps Each LE  Seated hamstring curls with GTB 3 sets of 8 reps Each LE  Seated horizontal Shoulder ABD- GTB 3 sets of 8 reps    Seated gluteal press GTB 3x8   Seated Cross Punches with Boxing Mitts 4x10 each UE     PATIENT EDUCATION: Education details: Exercise technique Person educated: Patient Education method: Explanation, Demonstration, Tactile cues, Verbal cues, and Handouts Education comprehension: verbalized understanding, returned demonstration, verbal cues required, tactile cues required, and needs further education  HOME EXERCISE PROGRAM: Access Code: [email or web address] URL: [email or web address] Date: 08/11/2022 Prepared by: Maureen Ralphs  Exercises - Seated Lumbar Flexion Stretch  - 7 x weekly - 3 sets - 30 sec hold - Seated Thoracic Flexion and Rotation with Arms Crossed  - 1 x daily - 7 x weekly - 3 sets - 30 sec hold - Seated Figure 4 Piriformis Stretch  - 1 x daily - 7 x weekly - 3 sets - 10 reps  GOALS: Goals reviewed with patient? Yes  SHORT TERM GOALS: Target date: 09/20/2022  Pt  will be independent with a comprehensive HEP in order to improve strength and balance in order to decrease fall risk and improve function at home and work.  Baseline: EVAL: Patient admits not performing much exercises - not moving around well. 10/25/2022-Patient reports not as compliant with previous HEP as instructed- Verbally reviewed Low back, cervical, thoracic stretching and importance of daily walking. 11/03/2022= Patient reports independent with cervical Stretches and some walking at home Goal status: MET   LONG TERM GOALS: Target date: 01/26/2023  Pt will improve FOTO to target score of 45 % to display perceived improvements in ability to complete ADL's.  Baseline: EVAL: 42: 11/03/2022= 55 Goal status: MET  2.  Pt will decrease 5TSTS by at least 5 seconds in order to demonstrate clinically significant improvement in LE strength.  Baseline: EVAL = 17.39 sec without UE support; 10/25/2022= 12.91 sec Goal status: PROGRESSING  3. Pt will increase by at least 0.15 m/s in order to demonstrate clinically significant improvement in community ambulation   Baseline: EVAL= 0.67 m/s; 10/25/2022= 0.77 m/s Goal status: PROGRESSING  4.  Pt will improve BERG by at least 3 points in order to demonstrate clinically significant improvement in balance.   Baseline: EVAL: to be assessed next visit: 08/23/2022= 36/56; 11/03/2022= 40/56 Goal status: MET  5.  Pt will decrease TUG to below 14 seconds/decrease in order to demonstrate decreased fall risk. Baseline: EVAL: 22.00 sec with upright 4WW; 10/25/2022= 15.98 sec with 4WW Goal status: PROGRESSING  6.  Pt will increase by at least 73m (171ft) in order to demonstrate clinically significant improvement in cardiopulmonary endurance and community ambulation  Baseline: EVAL: 3:45 min (450 feet with upright 4WW) 10/25/2022= 830 feet with 4WW Goal status: MET  7.   Patient will report returning to walking in to community places using most appropriate  assistive device vs current use of scooter for improved abilities with mobility during community outings Baseline: EVAL: Current using scooter for many social/community outings; 10/25/2022- Paitent continues to scooter for long distance community distances- due to some fatigue or SOB.  Goal status: ONGOING  8.  Pt will increase > 1000 feet in order to demonstrate clinically significant improvement in cardiopulmonary endurance and community ambulation   Baseline: 10/25/2022= 830 feet with 4WW  Goal status= NEW  9.  Pt will improve BERG by at least 3 points in order to demonstrate clinically significant improvement in balance.   Baseline: 11/03/2022= 40/56 Goal status: NEW  ASSESSMENT:  CLINICAL IMPRESSION: Pt tolerated all tasks during today's  visit. Pt did not report increase in pain with tasks. Pain was monitored throughout today's session, since pt received injection a few days ago. Seated therex was performed today since this was the pt's first visit in almost 3 weeks. This was done to ensure pt can tolerate PT tasks. Pt will continue to benefit from skilled PT services to address deficits and impairment identified in evaluation in order to maximize independence and safety in basic mobility required for performance of ADL, IADL, and leisure.    OBJECTIVE IMPAIRMENTS: Abnormal gait, cardiopulmonary status limiting activity, decreased activity tolerance, decreased balance, decreased coordination, decreased endurance, decreased mobility, difficulty walking, decreased ROM, decreased strength, hypomobility, impaired flexibility, impaired sensation, impaired UE functional use, postural dysfunction, obesity, and pain.   ACTIVITY LIMITATIONS: carrying, lifting, bending, standing, squatting, stairs, transfers, continence, and reach over head  PARTICIPATION LIMITATIONS: meal prep, cleaning, laundry, driving, shopping, community activity, and yard work  PERSONAL FACTORS: Age and 3+ comorbidities:  HTN, Arthritis, Low back pain, Trigeminal Neuralgia  are also affecting patient's functional outcome.   REHAB POTENTIAL: Good  CLINICAL DECISION MAKING: Evolving/moderate complexity  EVALUATION COMPLEXITY: Moderate  PLAN:  PT FREQUENCY: 1-2x/week  PT DURATION: 12 weeks  PLANNED INTERVENTIONS: Therapeutic exercises, Therapeutic activity, Neuromuscular re-education, Balance training, Gait training, Patient/Family education, Self Care, Joint mobilization, Stair training, Vestibular training, Canalith repositioning, DME instructions, Dry Needling, Electrical stimulation, Spinal manipulation, Spinal mobilization, Cryotherapy, Moist heat, Manual therapy, and Re-evaluation  PLAN FOR NEXT SESSION: Progress therex, bed mobility, and balance training. Continue with  training in Well zone for UE/LE strengthening and cardiovascular training. Try to retest goals next visit.    Debara Pickett, Student-PT 01/17/2023, 3:02 PM  This entire session was performed under direct supervision and direction of a licensed therapist/therapist assistant . I have personally read, edited and approve of the note as written.    3:02 PM, 01/17/23 Louis Meckel, PT Physical Therapist - [facility]  Outpatient Physical Therapy- Main Campus [phone]

## 2023-01-19 ENCOUNTER — Ambulatory Visit: Payer: Medicare HMO

## 2023-01-19 DIAGNOSIS — G8929 Other chronic pain: Secondary | ICD-10-CM | POA: Diagnosis not present

## 2023-01-19 DIAGNOSIS — R278 Other lack of coordination: Secondary | ICD-10-CM

## 2023-01-19 DIAGNOSIS — R262 Difficulty in walking, not elsewhere classified: Secondary | ICD-10-CM

## 2023-01-19 DIAGNOSIS — R2689 Other abnormalities of gait and mobility: Secondary | ICD-10-CM | POA: Diagnosis not present

## 2023-01-19 DIAGNOSIS — R2681 Unsteadiness on feet: Secondary | ICD-10-CM

## 2023-01-19 DIAGNOSIS — R269 Unspecified abnormalities of gait and mobility: Secondary | ICD-10-CM

## 2023-01-19 DIAGNOSIS — M6281 Muscle weakness (generalized): Secondary | ICD-10-CM | POA: Diagnosis not present

## 2023-01-19 DIAGNOSIS — M545 Low back pain, unspecified: Secondary | ICD-10-CM | POA: Diagnosis not present

## 2023-01-19 NOTE — Therapy (Signed)
OUTPATIENT PHYSICAL THERAPY NEURO TREATMENT    Patient Name: [name] MRN: [id number] DOB:1945/04/23, 77 y.o., male Today's Date: 01/19/2023   PCP: Ardyth Man, PA- C REFERRING PROVIDER: Morene Crocker, MD  END OF SESSION:  PT End of Session - 01/19/23 1106     Visit Number 36    Number of Visits 46    Date for PT Re-Evaluation 01/26/23    Authorization Type [employer]    Progress Note Due on Visit 40    PT Start Time 1106    PT Stop Time 1147    PT Time Calculation (min) 41 min    Equipment Utilized During Treatment Gait belt    Activity Tolerance Patient limited by pain    Behavior During Therapy [facility] for tasks assessed/performed                            Past Medical History:  Diagnosis Date   Anxiety    Arteriosclerosis of coronary artery 11/16/2011   Overview:  Stent 10/2011 stent rca 2015 with collaterals to lad which is chronically occluded    Benign enlargement of prostate    Benign essential HTN 06/11/2014   Benign prostatic hypertrophy without urinary obstruction 07/31/2014   Bilateral cataracts 05/16/2013   Overview:  [facility]     Bone spur of foot    Left   BP (high blood pressure) 11/09/2012   Cancer (HCC)    skin (forehead) and bladder   Carotid artery narrowing 02/08/2014   Depression    Detrusor hypertrophy    Diabetes (HCC)    Diabetes mellitus, type 2 (HCC) 12/12/2012   Diverticulosis    Dyspnea    Esophageal reflux    Esophageal reflux    Fothergill's neuralgia 08/07/2012   Overview:  [facility]    Gastritis    GERD (gastroesophageal reflux disease)    Headache    cluster headaches   Healed myocardial infarct 11/09/2012   Hearing loss in left ear    Heart disease    Hematuria    Hemorrhoids    History of hiatal hernia 12/14/2017   small    Hypercholesteremia    Lesion of bladder    Myocardial infarct (HCC)    Presence of stent in coronary artery 11/09/2012   Rectal bleeding     Trigeminal neuralgia    Trigeminal neuralgia    Valvular heart disease    Vitamin D deficiency    Past Surgical History:  Procedure Laterality Date   APPENDECTOMY     BOTOX INJECTION N/A 12/21/2017   Procedure: Bladder BOTOX INJECTION;  Surgeon: Vanna Scotland, MD;  Location: [facility] ORS;  Service: Urology;  Laterality: N/A;   BOTOX INJECTION N/A 09/11/2018   Procedure: Bladder BOTOX INJECTION;  Surgeon: Vanna Scotland, MD;  Location: [facility] ORS;  Service: Urology;  Laterality: N/A;   CARDIAC CATHETERIZATION     CARDIAC CATHETERIZATION N/A 01/22/2015   Procedure: Left Heart Cath;  Surgeon: Lamar Blinks, MD;  Location: [facility] INVASIVE CV LAB;  Service: Cardiovascular;  Laterality: N/A;   CARDIAC CATHETERIZATION N/A 01/22/2015   Procedure: Coronary Stent Intervention;  Surgeon: Marcina Millard, MD;  Location: [facility] INVASIVE CV LAB;  Service: Cardiovascular;  Laterality: N/A;   CATARACT EXTRACTION, BILATERAL     COLONOSCOPY WITH PROPOFOL N/A 12/08/2015   Procedure: COLONOSCOPY WITH PROPOFOL;  Surgeon: Christena Deem, MD;  Location: [facility] ENDOSCOPY;  Service: Endoscopy;  Laterality: N/A;  COLONOSCOPY WITH PROPOFOL N/A 12/09/2015   Procedure: COLONOSCOPY WITH PROPOFOL;  Surgeon: Christena Deem, MD;  Location: [facility] ENDOSCOPY;  Service: Endoscopy;  Laterality: N/A;   CORONARY ANGIOPLASTY     5 stents   CORONARY STENT PLACEMENT  2015   x5   CYSTOSCOPY N/A 09/11/2018   Procedure: CYSTOSCOPY;  Surgeon: Vanna Scotland, MD;  Location: [facility] ORS;  Service: Urology;  Laterality: N/A;   CYSTOSCOPY WITH BIOPSY N/A 12/21/2017   Procedure: CYSTOSCOPY WITH BIOPSY;  Surgeon: Vanna Scotland, MD;  Location: [facility] ORS;  Service: Urology;  Laterality: N/A;   ESOPHAGOGASTRODUODENOSCOPY (EGD) WITH PROPOFOL N/A 12/08/2015   Procedure: ESOPHAGOGASTRODUODENOSCOPY (EGD) WITH PROPOFOL;  Surgeon: Christena Deem, MD;  Location: [facility] ENDOSCOPY;  Service: Endoscopy;  Laterality: N/A;   ESOPHAGOGASTRODUODENOSCOPY  (EGD) WITH PROPOFOL N/A 12/13/2017   Procedure: ESOPHAGOGASTRODUODENOSCOPY (EGD) WITH PROPOFOL;  Surgeon: Christena Deem, MD;  Location: [facility] ENDOSCOPY;  Service: Endoscopy;  Laterality: N/A;   EYE SURGERY     HERNIA REPAIR     kidney tumor remove     TRANSURETHRAL RESECTION OF BLADDER TUMOR WITH GYRUS (TURBT-GYRUS)  12/2013   UMBILICAL HERNIA REPAIR     urethral meatotomy     Patient Active Problem List   Diagnosis Date Noted   Class 2 obesity due to excess calories with body mass index (BMI) of 36.0 to 36.9 in adult 04/11/2020   Swelling of limb 03/28/2020   Lymphedema 03/28/2020   Trigeminal neuralgia 12/25/2019   Lower limb ulcer, calf, left, limited to breakdown of skin (HCC) 12/25/2019   OSA (obstructive sleep apnea) 01/23/2019   Acute systolic CHF (congestive heart failure) (HCC) 12/12/2018   Bruising 07/10/2018   Diet-controlled type 2 diabetes mellitus (HCC) 03/24/2018   Microalbuminuria 03/24/2018   Dizziness 11/17/2017   Simple chronic bronchitis (HCC) 09/22/2017   SOBOE (shortness of breath on exertion) 02/18/2017   Chronic midline low back pain without sciatica 12/29/2016   Periodic limb movement disorder 12/29/2016   Benign essential tremor 06/22/2016   Pure hypercholesterolemia 06/20/2015   Erectile dysfunction due to arterial insufficiency 06/16/2015   Unstable angina (HCC) 01/22/2015   Abdominal aortic 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 rupture (HCC) 12/31/2014   Benign prostatic hyperplasia without urinary obstruction 07/31/2014   Enlarged prostate 07/31/2014   Benign essential HTN 06/11/2014   Carotid artery narrowing 02/08/2014   Carotid artery obstruction 02/08/2014   Bilateral carotid artery stenosis 02/08/2014   Chest pain 08/20/2013   Bilateral cataracts 05/16/2013   Cataract 05/16/2013   Clinical depression 12/12/2012   Diabetes mellitus, type 2 (HCC) 12/12/2012   Combined fat and carbohydrate induced hyperlipemia 12/12/2012   Major depressive disorder,  single episode, unspecified 12/12/2012   Acid reflux 11/09/2012   Presence of stent in coronary artery 11/09/2012   BP (high blood pressure) 11/09/2012   Healed myocardial infarct 11/09/2012   Gastro-esophageal reflux disease without esophagitis 11/09/2012   History of cardiovascular surgery 11/09/2012   Fothergill's neuralgia 08/07/2012   Swelling of testicle 04/06/2012   Disorder of male genital organ 04/06/2012   Swelling of the testicles 04/06/2012   Arteriosclerosis of coronary artery 11/16/2011   CAD in native artery 11/16/2011   Fatigue 06/04/2011   Avitaminosis D 11/30/2010    ONSET DATE: 03/17/2022- Worsening symptoms  REFERRING DIAG:  G20.A1 (ICD-10-CM) - Parkinsons  R26.2 (ICD-10-CM) - Difficulty walking    THERAPY DIAG:  Muscle weakness (generalized)  Difficulty in walking, not elsewhere classified  Other abnormalities of gait and mobility  Abnormality of gait and  mobility  Unsteadiness on feet  Other lack of coordination  Rationale for Evaluation and Treatment: Rehabilitation  SUBJECTIVE:                                                                                                                                                                                             SUBJECTIVE STATEMENT:  Pt reported low back pain 5/10 on NPS, but no other changes or falls since last visit.     Pt accompanied by: significant other  PERTINENT HISTORY: Patient is a 77 year old male with referral to outpatient PT for Parkinsons and difficulty walking. He was recently seen by Neurology worsening symptoms of parkinson- difficulty with walking. He has past medical history of Arthritis, Bladder cancer, and Trigeminal Neuralgia.  PAIN:  Are you having pain? NO  PRECAUTIONS: Fall  WEIGHT BEARING RESTRICTIONS: No  FALLS: Has patient fallen in last 6 months? Yes. Number of falls 1  LIVING ENVIRONMENT: Lives with: lives with their spouse Lives in:  House/apartment Stairs: Yes: External: 2 steps; has grab bar Has following equipment at home: Dan Humphreys - 2 wheeled, Environmental consultant - 4 wheeled, and Grab bars  PLOF: Independent with household mobility with device, Independent with community mobility with device, Independent with transfers, and Requires assistive device for independence  PATIENT GOALS: Get to where I can walk better again and get my legs stronger.   OBJECTIVE:    TODAY'S TREATMENT:                                                                                                                              DATE: 01/19/23    THEREX-   Lumbar stretch- forward lean in chair- repetitive- 2 x 10 reps   Seated hip march 3# AW 2 sets of 10 reps - Each LE  Seated LAQ 3# AW 2 sets of 10 reps Each LE  Seated hamstring curls with GTB 3 sets of 8 reps Each LE  Seated horizontal Shoulder ABD- GTB 3 sets of 8 reps    Seated gluteal press GTB 3x8  Seated Cross Punches with Boxing Mitts 4x10 each UE  Ambulation 2# AW x 300 feet with upright 4WW     PATIENT EDUCATION: Education details: Exercise technique Person educated: Patient Education method: Explanation, Demonstration, Tactile cues, Verbal cues, and Handouts Education comprehension: verbalized understanding, returned demonstration, verbal cues required, tactile cues required, and needs further education  HOME EXERCISE PROGRAM: Access Code: [email or web address] URL: [email or web address] Date: 08/11/2022 Prepared by: Maureen Ralphs  Exercises - Seated Lumbar Flexion Stretch  - 7 x weekly - 3 sets - 30 sec hold - Seated Thoracic Flexion and Rotation with Arms Crossed  - 1 x daily - 7 x weekly - 3 sets - 30 sec hold - Seated Figure 4 Piriformis Stretch  - 1 x daily - 7 x weekly - 3 sets - 10 reps  GOALS: Goals reviewed with patient? Yes  SHORT TERM GOALS: Target date: 09/20/2022  Pt will be independent with a comprehensive HEP in order to improve strength and  balance in order to decrease fall risk and improve function at home and work.  Baseline: EVAL: Patient admits not performing much exercises - not moving around well. 10/25/2022-Patient reports not as compliant with previous HEP as instructed- Verbally reviewed Low back, cervical, thoracic stretching and importance of daily walking. 11/03/2022= Patient reports independent with cervical Stretches and some walking at home Goal status: MET   LONG TERM GOALS: Target date: 01/26/2023  Pt will improve FOTO to target score of 45 % to display perceived improvements in ability to complete ADL's.  Baseline: EVAL: 42: 11/03/2022= 55 Goal status: MET  2.  Pt will decrease 5TSTS by at least 5 seconds in order to demonstrate clinically significant improvement in LE strength.  Baseline: EVAL = 17.39 sec without UE support; 10/25/2022= 12.91 sec Goal status: PROGRESSING  3. Pt will increase by at least 0.15 m/s in order to demonstrate clinically significant improvement in community ambulation   Baseline: EVAL= 0.67 m/s; 10/25/2022= 0.77 m/s Goal status: PROGRESSING  4.  Pt will improve BERG by at least 3 points in order to demonstrate clinically significant improvement in balance.   Baseline: EVAL: to be assessed next visit: 08/23/2022= 36/56; 11/03/2022= 40/56 Goal status: MET  5.  Pt will decrease TUG to below 14 seconds/decrease in order to demonstrate decreased fall risk. Baseline: EVAL: 22.00 sec with upright 4WW; 10/25/2022= 15.98 sec with 4WW Goal status: PROGRESSING  6.  Pt will increase by at least 23m (119ft) in order to demonstrate clinically significant improvement in cardiopulmonary endurance and community ambulation  Baseline: EVAL: 3:45 min (450 feet with upright 4WW) 10/25/2022= 830 feet with 4WW Goal status: MET  7.   Patient will report returning to walking in to community places using most appropriate assistive device vs current use of scooter for improved abilities with mobility  during community outings Baseline: EVAL: Current using scooter for many social/community outings; 10/25/2022- Paitent continues to scooter for long distance community distances- due to some fatigue or SOB.  Goal status: ONGOING  8.  Pt will increase > 1000 feet in order to demonstrate clinically significant improvement in cardiopulmonary endurance and community ambulation   Baseline: 10/25/2022= 830 feet with 4WW  Goal status= NEW  9.  Pt will improve BERG by at least 3 points in order to demonstrate clinically significant improvement in balance.   Baseline: 11/03/2022= 40/56 Goal status: NEW  ASSESSMENT:  CLINICAL IMPRESSION: Pt reported increased low back pain and hip pain after ambulation today, but pain subsided after  ambulation was complete. Modification of tasks was done to ensure pain did not increase at any other points during the visit. Otherwise, pt tolerated all other tasks during today's visit. Pt will continue to benefit from skilled PT services to address deficits and impairment identified in evaluation in order to maximize independence and safety in basic mobility required for performance of ADL, IADL, and leisure.    OBJECTIVE IMPAIRMENTS: Abnormal gait, cardiopulmonary status limiting activity, decreased activity tolerance, decreased balance, decreased coordination, decreased endurance, decreased mobility, difficulty walking, decreased ROM, decreased strength, hypomobility, impaired flexibility, impaired sensation, impaired UE functional use, postural dysfunction, obesity, and pain.   ACTIVITY LIMITATIONS: carrying, lifting, bending, standing, squatting, stairs, transfers, continence, and reach over head  PARTICIPATION LIMITATIONS: meal prep, cleaning, laundry, driving, shopping, community activity, and yard work  PERSONAL FACTORS: Age and 3+ comorbidities: HTN, Arthritis, Low back pain, Trigeminal Neuralgia  are also affecting patient's functional outcome.   REHAB  POTENTIAL: Good  CLINICAL DECISION MAKING: Evolving/moderate complexity  EVALUATION COMPLEXITY: Moderate  PLAN:  PT FREQUENCY: 1-2x/week  PT DURATION: 12 weeks  PLANNED INTERVENTIONS: Therapeutic exercises, Therapeutic activity, Neuromuscular re-education, Balance training, Gait training, Patient/Family education, Self Care, Joint mobilization, Stair training, Vestibular training, Canalith repositioning, DME instructions, Dry Needling, Electrical stimulation, Spinal manipulation, Spinal mobilization, Cryotherapy, Moist heat, Manual therapy, and Re-evaluation  PLAN FOR NEXT SESSION: Progress therex, bed mobility, and balance training. Continue with  training in Well zone for UE/LE strengthening and cardiovascular training. Try to retest goals next visit.   Debara Pickett, SPT 01/19/2023, 3:23 PM  This entire session was performed under direct supervision and direction of a licensed therapist/therapist assistant . I have personally read, edited and approve of the note as written.    3:23 PM, 01/19/23 Louis Meckel, PT Physical Therapist - [facility]  Outpatient Physical Therapy- Main Campus [phone]

## 2023-01-24 ENCOUNTER — Ambulatory Visit: Payer: Medicare HMO

## 2023-01-24 DIAGNOSIS — R278 Other lack of coordination: Secondary | ICD-10-CM

## 2023-01-24 DIAGNOSIS — R2689 Other abnormalities of gait and mobility: Secondary | ICD-10-CM

## 2023-01-24 DIAGNOSIS — G8929 Other chronic pain: Secondary | ICD-10-CM | POA: Diagnosis not present

## 2023-01-24 DIAGNOSIS — M6281 Muscle weakness (generalized): Secondary | ICD-10-CM

## 2023-01-24 DIAGNOSIS — R269 Unspecified abnormalities of gait and mobility: Secondary | ICD-10-CM

## 2023-01-24 DIAGNOSIS — M545 Low back pain, unspecified: Secondary | ICD-10-CM | POA: Diagnosis not present

## 2023-01-24 DIAGNOSIS — R262 Difficulty in walking, not elsewhere classified: Secondary | ICD-10-CM

## 2023-01-24 DIAGNOSIS — R2681 Unsteadiness on feet: Secondary | ICD-10-CM | POA: Diagnosis not present

## 2023-01-24 NOTE — Therapy (Signed)
OUTPATIENT PHYSICAL THERAPY NEURO TREATMENT    Patient Name: [name] MRN: [id number] DOB:[birth year], 77 y.o., male Today's Date: 01/24/2023   PCP: Ardyth Man, PA- C REFERRING PROVIDER: Morene Crocker, MD  END OF SESSION:  PT End of Session - 01/24/23 1108     Visit Number 37    Number of Visits 46    Date for PT Re-Evaluation 01/26/23    Authorization Type [employer]    Progress Note Due on Visit 40    PT Start Time 1106    PT Stop Time 1144    PT Time Calculation (min) 38 min    Equipment Utilized During Treatment Gait belt    Activity Tolerance Patient limited by pain    Behavior During Therapy WFL for tasks assessed/performed                            Past Medical History:  Diagnosis Date   Anxiety    Arteriosclerosis of coronary artery 11/16/2011   Overview:  Stent 10/2011 stent rca 2015 with collaterals to lad which is chronically occluded    Benign enlargement of prostate    Benign essential HTN 06/11/2014   Benign prostatic hypertrophy without urinary obstruction 07/31/2014   Bilateral cataracts 05/16/2013   Overview:  [facility]     Bone spur of foot    Left   BP (high blood pressure) 11/09/2012   Cancer (HCC)    skin (forehead) and bladder   Carotid artery narrowing 02/08/2014   Depression    Detrusor hypertrophy    Diabetes (HCC)    Diabetes mellitus, type 2 (HCC) 12/12/2012   Diverticulosis    Dyspnea    Esophageal reflux    Esophageal reflux    Fothergill's neuralgia 08/07/2012   Overview:  [facility]    Gastritis    GERD (gastroesophageal reflux disease)    Headache    cluster headaches   Healed myocardial infarct 11/09/2012   Hearing loss in left ear    Heart disease    Hematuria    Hemorrhoids    History of hiatal hernia 12/14/2017   small    Hypercholesteremia    Lesion of bladder    Myocardial infarct (HCC)    Presence of stent in coronary artery 11/09/2012   Rectal bleeding     Trigeminal neuralgia    Trigeminal neuralgia    Valvular heart disease    Vitamin D deficiency    Past Surgical History:  Procedure Laterality Date   APPENDECTOMY     BOTOX INJECTION N/A 12/21/2017   Procedure: Bladder BOTOX INJECTION;  Surgeon: Vanna Scotland, MD;  Location: [facility] ORS;  Service: Urology;  Laterality: N/A;   BOTOX INJECTION N/A 09/11/2018   Procedure: Bladder BOTOX INJECTION;  Surgeon: Vanna Scotland, MD;  Location: [facility] OR[facility];  Service: Urology;  Laterality: N/A;   CARDIAC CATHETERIZATION     CARDIAC CATHETERIZATION N/A 01/22/2015   Procedure: Left Heart Cath;  Surgeon: Lamar Blinks, MD;  Location: [facility] INVASIVE CV LAB;  Service: Cardiovascular;  Laterality: N/A;   CARDIAC CATHETERIZATION N/A 01/22/2015   Procedure: Coronary Stent Intervention;  Surgeon: Marcina Millard, MD;  Location: [facility] INV[facility]IVE CV LAB;  Service: Cardiovascular;  Laterality: N/A;   CATARACT EXTRACTION, BILATERAL     COLONOSCOPY WITH PROPOFOL N/A 12/08/2015   Procedure: COLONOSCOPY WITH PROPOFOL;  Surgeon: Christena Deem, MD;  Location: [facility] ENDOSCOPY;  Service: Endoscopy;  Laterality: N/A;  COLONOSCOPY WITH PROPOFOL N/A 12/09/2015   Procedure: COLONOSCOPY WITH PROPOFOL;  Surgeon: Christena Deem, MD;  Location: [facility] ENDOSCOPY;  Service: Endoscopy;  Laterality: N/A;   CORONARY ANGIOPLASTY     5 stents   CORONARY STENT PLACEMENT  2015   x5   CYSTOSCOPY N/A 09/11/2018   Procedure: CYSTOSCOPY;  Surgeon: Vanna Scotland, MD;  Location: [facility] ORS;  Service: Urology;  Laterality: N/A;   CYSTOSCOPY WITH BIOPSY N/A 12/21/2017   Procedure: CYSTOSCOPY WITH BIOPSY;  Surgeon: Vanna Scotland, MD;  Location: [facility] OR[facility];  Service: Urology;  Laterality: N/A;   ESOPHAGOGASTRODUODENOSCOPY (EGD) WITH PROPOFOL N/A 12/08/2015   Procedure: ESOPHAGOGASTRODUODENOSCOPY (EGD) WITH PROPOFOL;  Surgeon: Christena Deem, MD;  Location: [facility] ENDOSCOPY;  Service: Endoscopy;  Laterality: N/A;   ESOPHAGOGASTRODUODENOSCOPY  (EGD) WITH PROPOFOL N/A 12/13/2017   Procedure: ESOPHAGOGASTRODUODENOSCOPY (EGD) WITH PROPOFOL;  Surgeon: Christena Deem, MD;  Location: [facility] ENDOSCOPY;  Service: Endoscopy;  Laterality: N/A;   EYE SURGERY     HERNIA REPAIR     kidney tumor remove     TRANSURETHRAL RESECTION OF BLADDER TUMOR WITH GYRUS (TURBT-GYRUS)  12/2013   UMBILICAL HERNIA REPAIR     urethral meatotomy     Patient Active Problem List   Diagnosis Date Noted   Class 2 obesity due to excess calories with body mass index (BMI) of 36.0 to 36.9 in adult 04/11/2020   Swelling of limb 03/28/2020   Lymphedema 03/28/2020   Trigeminal neuralgia 12/25/2019   Lower limb ulcer, calf, left, limited to breakdown of skin (HCC) 12/25/2019   OSA (obstructive sleep apnea) 01/23/2019   Acute systolic CHF (congestive heart failure) (HCC) 12/12/2018   Bruising 07/10/2018   Diet-controlled type 2 diabetes mellitus (HCC) 03/24/2018   Microalbuminuria 03/24/2018   Dizziness 11/17/2017   Simple chronic bronchitis (HCC) 09/22/2017   SOBOE (shortness of breath on exertion) 02/18/2017   Chronic midline low back pain without sciatica 12/29/2016   Periodic limb movement disorder 12/29/2016   Benign essential tremor 06/22/2016   Pure hypercholesterolemia 06/20/2015   Erectile dysfunction due to arterial insufficiency 06/16/2015   Unstable angina (HCC) 01/22/2015   Abdominal aortic 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 rupture (HCC) 12/31/2014   Benign prostatic hyperplasia without urinary obstruction 07/31/2014   Enlarged prostate 07/31/2014   Benign essential HTN 06/11/2014   Carotid artery narrowing 02/08/2014   Carotid artery obstruction 02/08/2014   Bilateral carotid artery stenosis 02/08/2014   Chest pain 08/20/2013   Bilateral cataracts 05/16/2013   Cataract 05/16/2013   Clinical depression 12/12/2012   Diabetes mellitus, type 2 (HCC) 12/12/2012   Combined fat and carbohydrate induced hyperlipemia 12/12/2012   Major depressive disorder,  single episode, unspecified 12/12/2012   Acid reflux 11/09/2012   Presence of stent in coronary artery 11/09/2012   BP (high blood pressure) 11/09/2012   Healed myocardial infarct 11/09/2012   Gastro-esophageal reflux disease without esophagitis 11/09/2012   History of cardiovascular surgery 11/09/2012   Fothergill's neuralgia 08/07/2012   Swelling of testicle 04/06/2012   Disorder of male genital organ 04/06/2012   Swelling of the testicles 04/06/2012   Arteriosclerosis of coronary artery 11/16/2011   CAD in native artery 11/16/2011   Fatigue 06/04/2011   Avitaminosis D 11/30/2010    ONSET DATE: 03/17/2022- Worsening symptoms  REFERRING DIAG:  G20.A1 (ICD-10-CM) - Parkinsons  R26.2 (ICD-10-CM) - Difficulty walking    THERAPY DIAG:  Muscle weakness (generalized)  Difficulty in walking, not elsewhere classified  Other abnormalities of gait and mobility  Abnormality of gait and  mobility  Unsteadiness on feet  Other lack of coordination  Rationale for Evaluation and Treatment: Rehabilitation  SUBJECTIVE:                                                                                                                                                                                             SUBJECTIVE STATEMENT:  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    Pt accompanied by: significant other  PERTINENT HISTORY: Patient is a 77 year old male with referral to outpatient PT for Parkinsons and difficulty walking. He was recently seen by Neurology worsening symptoms of parkinson- difficulty with walking. He has past medical history of Arthritis, Bladder cancer, and Trigeminal Neuralgia.  PAIN:  Are you having pain? NO  PRECAUTIONS: Fall  WEIGHT BEARING RESTRICTIONS: No  FALLS: Has patient fallen in last 6 months? Yes. Number of falls 1  LIVING ENVIRONMENT: Lives with: lives with their  spouse Lives in: House/apartment Stairs: Yes: External: 2 steps; has grab bar Has following equipment at home: Dan Humphreys - 2 wheeled, Environmental consultant - 4 wheeled, and Grab bars  PLOF: Independent with household mobility with device, Independent with community mobility with device, Independent with transfers, and Requires assistive device for independence  PATIENT GOALS: Get to where I can walk better again and get my legs stronger.   OBJECTIVE:    TODAY'S TREATMENT:                                                                                                                              DATE: 01/24/23    THEREX-   Lumbar stretch- forward lean in chair- repetitive- 2 x 10 reps   Seated hip march 3# AW 2 sets of 10 reps - Each LE  Seated LAQ 3# AW 2 sets of 10 reps Each LE  Seated hamstring curls with GTB 3 sets of 8 reps Each LE  Seated horizontal Shoulder ABD- GTB 3 sets of 8 reps    Seated  gluteal press GTB 3x8   Seated Cross Punches with Boxing Mitts 4x10 each UE  Ambulation 2# AW x 450 feet with rollator - VC's involving performing heel strike during ambulation   Standing hip extensions 2x10 each LE with UE support   PATIENT EDUCATION: Education details: Exercise technique Person educated: Patient Education method: Explanation, Demonstration, Tactile cues, Verbal cues, and Handouts Education comprehension: verbalized understanding, returned demonstration, verbal cues required, tactile cues required, and needs further education  HOME EXERCISE PROGRAM: Access Code: [email or web address] URL: [email or web address] Date: 08/11/2022 Prepared by: Maureen Ralphs  Exercises - Seated Lumbar Flexion Stretch  - 7 x weekly - 3 sets - 30 sec hold - Seated Thoracic Flexion and Rotation with Arms Crossed  - 1 x daily - 7 x weekly - 3 sets - 30 sec hold - Seated Figure 4 Piriformis Stretch  - 1 x daily - 7 x weekly - 3 sets - 10 reps  GOALS: Goals reviewed with patient?  Yes  SHORT TERM GOALS: Target date: 09/20/2022  Pt will be independent with a comprehensive HEP in order to improve strength and balance in order to decrease fall risk and improve function at home and work.  Baseline: EVAL: Patient admits not performing much exercises - not moving around well. 10/25/2022-Patient reports not as compliant with previous HEP as instructed- Verbally reviewed Low back, cervical, thoracic stretching and importance of daily walking. 11/03/2022= Patient reports independent with cervical Stretches and some walking at home Goal status: MET   LONG TERM GOALS: Target date: 01/26/2023  Pt will improve FOTO to target score of 45 % to display perceived improvements in ability to complete ADL's.  Baseline: EVAL: 42: 11/03/2022= 55 Goal status: MET  2.  Pt will decrease 5TSTS by at least 5 seconds in order to demonstrate clinically significant improvement in LE strength.  Baseline: EVAL = 17.39 sec without UE support; 10/25/2022= 12.91 sec Goal status: PROGRESSING  3. Pt will increase by at least 0.15 m/s in order to demonstrate clinically significant improvement in community ambulation   Baseline: EVAL= 0.67 m/s; 10/25/2022= 0.77 m/s Goal status: PROGRESSING  4.  Pt will improve BERG by at least 3 points in order to demonstrate clinically significant improvement in balance.   Baseline: EVAL: to be assessed next visit: 08/23/2022= 36/56; 11/03/2022= 40/56 Goal status: MET  5.  Pt will decrease TUG to below 14 seconds/decrease in order to demonstrate decreased fall risk. Baseline: EVAL: 22.00 sec with upright 4WW; 10/25/2022= 15.98 sec with 4WW Goal status: PROGRESSING  6.  Pt will increase by at least 73m (134ft) in order to demonstrate clinically significant improvement in cardiopulmonary endurance and community ambulation  Baseline: EVAL: 3:45 min (450 feet with upright 4WW) 10/25/2022= 830 feet with 4WW Goal status: MET  7.   Patient will report returning to  walking in to community places using most appropriate assistive device vs current use of scooter for improved abilities with mobility during community outings Baseline: EVAL: Current using scooter for many social/community outings; 10/25/2022- Paitent continues to scooter for long distance community distances- due to some fatigue or SOB.  Goal status: ONGOING  8.  Pt will increase > 1000 feet in order to demonstrate clinically significant improvement in cardiopulmonary endurance and community ambulation   Baseline: 10/25/2022= 830 feet with 4WW  Goal status= NEW  9.  Pt will improve BERG by at least 3 points in order to demonstrate clinically significant improvement in balance.   Baseline: 11/03/2022= 40/56 Goal status:  NEW  ASSESSMENT:  CLINICAL IMPRESSION: Pt tolerated all tasks during today's visit. Pt was able to ambulate 150 feet more compared to last visit and did not report increased pain. Pt was able to perform standing hip extensions with UE support. Pt felt fatigued, but accomplished after today's visit. Pt will continue to benefit from skilled PT services to address deficits and impairment identified in evaluation in order to maximize independence and safety in basic mobility required for performance of ADL, IADL, and leisure.    OBJECTIVE IMPAIRMENTS: Abnormal gait, cardiopulmonary status limiting activity, decreased activity tolerance, decreased balance, decreased coordination, decreased endurance, decreased mobility, difficulty walking, decreased ROM, decreased strength, hypomobility, impaired flexibility, impaired sensation, impaired UE functional use, postural dysfunction, obesity, and pain.   ACTIVITY LIMITATIONS: carrying, lifting, bending, standing, squatting, stairs, transfers, continence, and reach over head  PARTICIPATION LIMITATIONS: meal prep, cleaning, laundry, driving, shopping, community activity, and yard work  PERSONAL FACTORS: Age and 3+ comorbidities: HTN,  Arthritis, Low back pain, Trigeminal Neuralgia  are also affecting patient's functional outcome.   REHAB POTENTIAL: Good  CLINICAL DECISION MAKING: Evolving/moderate complexity  EVALUATION COMPLEXITY: Moderate  PLAN:  PT FREQUENCY: 1-2x/week  PT DURATION: 12 weeks  PLANNED INTERVENTIONS: Therapeutic exercises, Therapeutic activity, Neuromuscular re-education, Balance training, Gait training, Patient/Family education, Self Care, Joint mobilization, Stair training, Vestibular training, Canalith repositioning, DME instructions, Dry Needling, Electrical stimulation, Spinal manipulation, Spinal mobilization, Cryotherapy, Moist heat, Manual therapy, and Re-evaluation  PLAN FOR NEXT SESSION: Progress therex, bed mobility, and balance training. Continue with  training in Well zone for UE/LE strengthening and cardiovascular training. Try to retest goals next visit.   Debara Pickett, SPT 01/24/2023, 11:52 AM  This entire session was performed under direct supervision and direction of a licensed therapist/therapist assistant . I have personally read, edited and approve of the note as written.    11:52 AM, 01/24/23 Louis Meckel, PT Physical Therapist - [place] [facility]  Outpatient Physical Therapy- Main Campus [phone]

## 2023-01-26 ENCOUNTER — Ambulatory Visit: Payer: Medicare HMO

## 2023-01-26 DIAGNOSIS — M6281 Muscle weakness (generalized): Secondary | ICD-10-CM | POA: Diagnosis not present

## 2023-01-26 DIAGNOSIS — M545 Low back pain, unspecified: Secondary | ICD-10-CM | POA: Diagnosis not present

## 2023-01-26 DIAGNOSIS — R2681 Unsteadiness on feet: Secondary | ICD-10-CM

## 2023-01-26 DIAGNOSIS — G8929 Other chronic pain: Secondary | ICD-10-CM | POA: Diagnosis not present

## 2023-01-26 DIAGNOSIS — R2689 Other abnormalities of gait and mobility: Secondary | ICD-10-CM | POA: Diagnosis not present

## 2023-01-26 DIAGNOSIS — R269 Unspecified abnormalities of gait and mobility: Secondary | ICD-10-CM

## 2023-01-26 DIAGNOSIS — R278 Other lack of coordination: Secondary | ICD-10-CM

## 2023-01-26 DIAGNOSIS — R262 Difficulty in walking, not elsewhere classified: Secondary | ICD-10-CM | POA: Diagnosis not present

## 2023-01-26 NOTE — Therapy (Signed)
OUTPATIENT PHYSICAL THERAPY NEURO TREATMENT    Patient Name: [name] MRN: [id number] DOB:1945-07-01, 77 y.o., male Today's Date: 01/26/2023   PCP: Ardyth Man, PA- C REFERRING PROVIDER: Morene Crocker, MD  END OF SESSION:  PT End of Session - 01/26/23 1100     Visit Number 38    Number of Visits 46    Date for PT Re-Evaluation 01/26/23    Authorization Type [employer]    Progress Note Due on Visit 40    PT Start Time 1102    PT Stop Time 1142    PT Time Calculation (min) 40 min    Equipment Utilized During Treatment Gait belt    Activity Tolerance Patient limited by pain    Behavior During Therapy [facility] for tasks assessed/performed                            Past Medical History:  Diagnosis Date   Anxiety    Arteriosclerosis of coronary artery 11/16/2011   Overview:  Stent 10/2011 stent rca 2015 with collaterals to lad which is chronically occluded    Benign enlargement of prostate    Benign essential HTN 06/11/2014   Benign prostatic hypertrophy without urinary obstruction 07/31/2014   Bilateral cataracts 05/16/2013   Overview:  [facility]     Bone spur of foot    Left   BP (high blood pressure) 11/09/2012   Cancer (HCC)    skin (forehead) and bladder   Carotid artery narrowing 02/08/2014   Depression    Detrusor hypertrophy    Diabetes (HCC)    Diabetes mellitus, type 2 (HCC) 12/12/2012   Diverticulosis    Dyspnea    Esophageal reflux    Esophageal reflux    Fothergill's neuralgia 08/07/2012   Overview:  [facility] Neurology    Gastritis    GERD (gastroesophageal reflux disease)    Headache    cluster headaches   Healed myocardial infarct 11/09/2012   Hearing loss in left ear    Heart disease    Hematuria    Hemorrhoids    History of hiatal hernia 12/14/2017   small    Hypercholesteremia    Lesion of bladder    Myocardial infarct (HCC)    Presence of stent in coronary artery 11/09/2012   Rectal bleeding     Trigeminal neuralgia    Trigeminal neuralgia    Valvular heart disease    Vitamin D deficiency    Past Surgical History:  Procedure Laterality Date   APPENDECTOMY     BOTOX INJECTION N/A 12/21/2017   Procedure: Bladder BOTOX INJECTION;  Surgeon: Vanna Scotland, MD;  Location: [facility] ORS;  Service: Urology;  Laterality: N/A;   BOTOX INJECTION N/A 09/11/2018   Procedure: Bladder BOTOX INJECTION;  Surgeon: Vanna Scotland, MD;  Location: [facility] ORS;  Service: Urology;  Laterality: N/A;   CARDIAC CATHETERIZATION     CARDIAC CATHETERIZATION N/A 01/22/2015   Procedure: Left Heart Cath;  Surgeon: Lamar Blinks, MD;  Location: [facility] INVASIVE CV LAB;  Service: Cardiovascular;  Laterality: N/A;   CARDIAC CATHETERIZATION N/A 01/22/2015   Procedure: Coronary Stent Intervention;  Surgeon: Marcina Millard, MD;  Location: [facility] INVASIVE CV LAB;  Service: Cardiovascular;  Laterality: N/A;   CATARACT EXTRACTION, BILATERAL     COLONOSCOPY WITH PROPOFOL N/A 12/08/2015   Procedure: COLONOSCOPY WITH PROPOFOL;  Surgeon: Christena Deem, MD;  Location: [facility] ENDOSCOPY;  Service: Endoscopy;  Laterality: N/A;  COLONOSCOPY WITH PROPOFOL N/A 12/09/2015   Procedure: COLONOSCOPY WITH PROPOFOL;  Surgeon: Christena Deem, MD;  Location: [facility] ENDOSCOPY;  Service: Endoscopy;  Laterality: N/A;   CORONARY ANGIOPLASTY     5 stents   CORONARY STENT PLACEMENT  2015   x5   CYSTOSCOPY N/A 09/11/2018   Procedure: CYSTOSCOPY;  Surgeon: Vanna Scotland, MD;  Location: [facility] ORS;  Service: Urology;  Laterality: N/A;   CYSTOSCOPY WITH BIOPSY N/A 12/21/2017   Procedure: CYSTOSCOPY WITH BIOPSY;  Surgeon: Vanna Scotland, MD;  Location: [facility] ORS;  Service: Urology;  Laterality: N/A;   ESOPHAGOGASTRODUODENOSCOPY (EGD) WITH PROPOFOL N/A 12/08/2015   Procedure: ESOPHAGOGASTRODUODENOSCOPY (EGD) WITH PROPOFOL;  Surgeon: Christena Deem, MD;  Location[facility] END[facility]COPY;  Service: Endoscopy;  Laterality: N/A;   ESOPHAGOGASTRODUODENOSCOPY  (EGD) WITH PROPOFOL N/A 12/13/2017   Procedure: ESOPHAGOGASTRODUODENOSCOPY (EGD) WITH PROPOFOL;  Surgeon: Christena Deem, MD;  Location: [facility] ENDOSCOPY;  Service: Endoscopy;  Laterality: N/A;   EYE SURGERY     HERNIA REPAIR     kidney tumor remove     TRANSURETHRAL RESECTION OF BLADDER TUMOR WITH GYRUS (TURBT-GYRUS)  12/2013   UMBILICAL HERNIA REPAIR     urethral meatotomy     Patient Active Problem List   Diagnosis Date Noted   Class 2 obesity due to excess calories with body mass index (BMI) of 36.0 to 36.9 in adult 04/11/2020   Swelling of limb 03/28/2020   Lymphedema 03/28/2020   Trigeminal neuralgia 12/25/2019   Lower limb ulcer, calf, left, limited to breakdown of skin (HCC) 12/25/2019   OSA (obstructive sleep apnea) 01/23/2019   Acute systolic CHF (congestive heart failure) (HCC) 12/12/2018   Bruising 07/10/2018   Diet-controlled type 2 diabetes mellitus (HCC) 03/24/2018   Microalbuminuria 03/24/2018   Dizziness 11/17/2017   Simple chronic bronchitis (HCC) 09/22/2017   SOBOE (shortness of breath on exertion) 02/18/2017   Chronic midline low back pain without sciatica 12/29/2016   Periodic limb movement disorder 12/29/2016   Benign essential tremor 06/22/2016   Pure hypercholesterolemia 06/20/2015   Erectile dysfunction due to arterial insufficiency 06/16/2015   Unstable angina (HCC) 01/22/2015   Abdominal aortic 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 rupture (HCC) 12/31/2014   Benign prostatic hyperplasia without urinary obstruction 07/31/2014   Enlarged prostate 07/31/2014   Benign essential HTN 06/11/2014   Carotid artery narrowing 02/08/2014   Carotid artery obstruction 02/08/2014   Bilateral carotid artery stenosis 02/08/2014   Chest pain 08/20/2013   Bilateral cataracts 05/16/2013   Cataract 05/16/2013   Clinical depression 12/12/2012   Diabetes mellitus, type 2 (HCC) 12/12/2012   Combined fat and carbohydrate induced hyperlipemia 12/12/2012   Major depressive disorder,  single episode, unspecified 12/12/2012   Acid reflux 11/09/2012   Presence of stent in coronary artery 11/09/2012   BP (high blood pressure) 11/09/2012   Healed myocardial infarct 11/09/2012   Gastro-esophageal reflux disease without esophagitis 11/09/2012   History of cardiovascular surgery 11/09/2012   Fothergill's neuralgia 08/07/2012   Swelling of testicle 04/06/2012   Disorder of male genital organ 04/06/2012   Swelling of the testicles 04/06/2012   Arteriosclerosis of coronary artery 11/16/2011   CAD in native artery 11/16/2011   Fatigue 06/04/2011   Avitaminosis D 11/30/2010    ONSET DATE: 03/17/2022- Worsening symptoms  REFERRING DIAG:  G20.A1 (ICD-10-CM) - Parkinsons  R26.2 (ICD-10-CM) - Difficulty walking    THERAPY DIAG:  Muscle weakness (generalized)  Difficulty in walking, not elsewhere classified  Other abnormalities of gait and mobility  Abnormality of gait and  mobility  Unsteadiness on feet  Other lack of coordination  Rationale for Evaluation and Treatment: Rehabilitation  SUBJECTIVE:                                                                                                                                                                                             SUBJECTIVE STATEMENT:  Pt reported that 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    Pt accompanied by: significant other  PERTINENT HISTORY: Patient is a 77 year old male with referral to outpatient PT for Parkinsons and difficulty walking. He was recently seen by Neurology worsening symptoms of parkinson- difficulty with walking. He has past medical history of Arthritis, Bladder cancer, and Trigeminal Neuralgia.  PAIN:  Are you having pain? NO  PRECAUTIONS: Fall  WEIGHT BEARING RESTRICTIONS: No  FALLS: Has patient fallen in last 6 months? Yes. Number of falls 1  LIVING ENVIRONMENT: Lives with: lives with their  spouse Lives in: House/apartment Stairs: Yes: External: 2 steps; has grab bar Has following equipment at home: Dan Humphreys - 2 wheeled, Environmental consultant - 4 wheeled, and Grab bars  PLOF: Independent with household mobility with device, Independent with community mobility with device, Independent with transfers, and Requires assistive device for independence  PATIENT GOALS: Get to where I can walk better again and get my legs stronger.   OBJECTIVE:    TODAY'S TREATMENT:                                                                                                                              DATE: 01/26/23    THEREX-   Lumbar stretch- forward lean in chair- repetitive- 2 x 10 reps   Seated hip march 3# AW 2 sets of 12 reps - Each LE  Seated LAQ 3# AW 2 sets of 12 reps Each LE  Seated hamstring curls with BTB 3 sets of 8 reps Each LE  Seated horizontal Shoulder ABD- BTB 3 sets of 8 reps    Seated gluteal press  BTB 3x8 each LE  Seated Cross Punches with Boxing Mitts 4x10 each UE  Ambulation 2# AW x 450 feet with upright RW - VC's involving performing heel strike during ambulation   Standing hip extensions 2x10 each LE with UE support  Standing hip abductions 2x10 each LE with UE support   PATIENT EDUCATION: Education details: Exercise technique Person educated: Patient Education method: Explanation, Demonstration, Tactile cues, Verbal cues, and Handouts Education comprehension: verbalized understanding, returned demonstration, verbal cues required, tactile cues required, and needs further education  HOME EXERCISE PROGRAM: Access Code: [email or web address] URL: [email or web address] Date: 08/11/2022 Prepared by: Maureen Ralphs  Exercises - Seated Lumbar Flexion Stretch  - 7 x weekly - 3 sets - 30 sec hold - Seated Thoracic Flexion and Rotation with Arms Crossed  - 1 x daily - 7 x weekly - 3 sets - 30 sec hold - Seated Figure 4 Piriformis Stretch  - 1 x daily - 7 x weekly - 3  sets - 10 reps  GOALS: Goals reviewed with patient? Yes  SHORT TERM GOALS: Target date: 09/20/2022  Pt will be independent with a comprehensive HEP in order to improve strength and balance in order to decrease fall risk and improve function at home and work.  Baseline: EVAL: Patient admits not performing much exercises - not moving around well. 10/25/2022-Patient reports not as compliant with previous HEP as instructed- Verbally reviewed Low back, cervical, thoracic stretching and importance of daily walking. 11/03/2022= Patient reports independent with cervical Stretches and some walking at home Goal status: MET   LONG TERM GOALS: Target date: 01/26/2023  Pt will improve FOTO to target score of 45 % to display perceived improvements in ability to complete ADL's.  Baseline: EVAL: 42: 11/03/2022= 55 Goal status: MET  2.  Pt will decrease 5TSTS by at least 5 seconds in order to demonstrate clinically significant improvement in LE strength.  Baseline: EVAL = 17.39 sec without UE support; 10/25/2022= 12.91 sec Goal status: PROGRESSING  3. Pt will increase by at least 0.15 m/s in order to demonstrate clinically significant improvement in community ambulation   Baseline: EVAL= 0.67 m/s; 10/25/2022= 0.77 m/s Goal status: PROGRESSING  4.  Pt will improve BERG by at least 3 points in order to demonstrate clinically significant improvement in balance.   Baseline: EVAL: to be assessed next visit: 08/23/2022= 36/56; 11/03/2022= 40/56 Goal status: MET  5.  Pt will decrease TUG to below 14 seconds/decrease in order to demonstrate decreased fall risk. Baseline: EVAL: 22.00 sec with upright 4WW; 10/25/2022= 15.98 sec with 4WW Goal status: PROGRESSING  6.  Pt will increase by at least 5m (126ft) in order to demonstrate clinically significant improvement in cardiopulmonary endurance and community ambulation  Baseline: EVAL: 3:45 min (450 feet with upright 4WW) 10/25/2022= 830 feet with 4WW Goal  status: MET  7.   Patient will report returning to walking in to community places using most appropriate assistive device vs current use of scooter for improved abilities with mobility during community outings Baseline: EVAL: Current using scooter for many social/community outings; 10/25/2022- Paitent continues to scooter for long distance community distances- due to some fatigue or SOB.  Goal status: ONGOING  8.  Pt will increase > 1000 feet in order to demonstrate clinically significant improvement in cardiopulmonary endurance and community ambulation   Baseline: 10/25/2022= 830 feet with 4WW  Goal status= NEW  9.  Pt will improve BERG by at least 3 points in order to demonstrate clinically significant  improvement in balance.   Baseline: 11/03/2022= 40/56 Goal status: NEW  ASSESSMENT:  CLINICAL IMPRESSION: Pt tolerated all tasks during today's visit. Pt was able to complete seated therex tasks with BTB today, along with standing hip abductions as well. Pt did not report increase of LBP throughout today's visit. Pt stated at the end of the visit that he has been able to ambulate in his home without the use of an AD over the last 2 days. Will perform recert next visit.  Pt will continue to benefit from skilled PT services to address deficits and impairment identified in evaluation in order to maximize independence and safety in basic mobility required for performance of ADL, IADL, and leisure.    OBJECTIVE IMPAIRMENTS: Abnormal gait, cardiopulmonary status limiting activity, decreased activity tolerance, decreased balance, decreased coordination, decreased endurance, decreased mobility, difficulty walking, decreased ROM, decreased strength, hypomobility, impaired flexibility, impaired sensation, impaired UE functional use, postural dysfunction, obesity, and pain.   ACTIVITY LIMITATIONS: carrying, lifting, bending, standing, squatting, stairs, transfers, continence, and reach over  head  PARTICIPATION LIMITATIONS: meal prep, cleaning, laundry, driving, shopping, community activity, and yard work  PERSONAL FACTORS: Age and 3+ comorbidities: HTN, Arthritis, Low back pain, Trigeminal Neuralgia  are also affecting patient's functional outcome.   REHAB POTENTIAL: Good  CLINICAL DECISION MAKING: Evolving/moderate complexity  EVALUATION COMPLEXITY: Moderate  PLAN:  PT FREQUENCY: 1-2x/week  PT DURATION: 12 weeks  PLANNED INTERVENTIONS: Therapeutic exercises, Therapeutic activity, Neuromuscular re-education, Balance training, Gait training, Patient/Family education, Self Care, Joint mobilization, Stair training, Vestibular training, Canalith repositioning, DME instructions, Dry Needling, Electrical stimulation, Spinal manipulation, Spinal mobilization, Cryotherapy, Moist heat, Manual therapy, and Re-evaluation  PLAN FOR NEXT SESSION: Progress therex, bed mobility, and balance training. Continue with  training in Well zone for UE/LE strengthening and cardiovascular training. Reassess all remaining goals next visit.   Debara Pickett, SPT 01/26/2023, 1:10 PM  This entire session was performed under direct supervision and direction of a licensed therapist/therapist assistant . I have personally read, edited and approve of the note as written.    1:10 PM, 01/26/23 Louis Meckel, PT Physical Therapist - [facility]  Outpatient [facility]- [facility] [phone]

## 2023-01-31 ENCOUNTER — Ambulatory Visit: Payer: Medicare HMO | Attending: Neurology

## 2023-01-31 DIAGNOSIS — R269 Unspecified abnormalities of gait and mobility: Secondary | ICD-10-CM | POA: Diagnosis not present

## 2023-01-31 DIAGNOSIS — R278 Other lack of coordination: Secondary | ICD-10-CM | POA: Insufficient documentation

## 2023-01-31 DIAGNOSIS — R2681 Unsteadiness on feet: Secondary | ICD-10-CM | POA: Diagnosis not present

## 2023-01-31 DIAGNOSIS — G8929 Other chronic pain: Secondary | ICD-10-CM | POA: Insufficient documentation

## 2023-01-31 DIAGNOSIS — M6281 Muscle weakness (generalized): Secondary | ICD-10-CM | POA: Insufficient documentation

## 2023-01-31 DIAGNOSIS — R2689 Other abnormalities of gait and mobility: Secondary | ICD-10-CM | POA: Diagnosis not present

## 2023-01-31 DIAGNOSIS — M545 Low back pain, unspecified: Secondary | ICD-10-CM | POA: Diagnosis not present

## 2023-01-31 DIAGNOSIS — R262 Difficulty in walking, not elsewhere classified: Secondary | ICD-10-CM | POA: Diagnosis not present

## 2023-01-31 NOTE — Therapy (Signed)
OUTPATIENT PHYSICAL THERAPY NEURO TREATMENT/RE-CERT    Patient Name: [name] MRN: [id number] DOB:[birth year], 77 y.o., male Today's Date: 01/31/2023   PCP: Ardyth Man, PA- C REFERRING PROVIDER: Morene Crocker, MD  END OF SESSION:  PT End of Session - 01/31/23 1056     Visit Number 39    Number of Visits 63    Date for PT Re-Evaluation 04/25/23    Authorization Type [employer]    Progress Note Due on Visit 40    PT Start Time 1100    PT Stop Time 1143    PT Time Calculation (min) 43 min    Equipment Utilized During Treatment Gait belt    Activity Tolerance Patient limited by pain    Behavior During Therapy [facility] for tasks assessed/performed                            Past Medical History:  Diagnosis Date   Anxiety    Arteriosclerosis of coronary artery 11/16/2011   Overview:  Stent 10/2011 stent rca 2015 with collaterals to lad which is chronically occluded    Benign enlargement of prostate    Benign essential HTN 06/11/2014   Benign prostatic hypertrophy without urinary obstruction 07/31/2014   Bilateral cataracts 05/16/2013   Overview:  [facility]     Bone spur of foot    Left   BP (high blood pressure) 11/09/2012   Cancer (HCC)    skin (forehead) and bladder   Carotid artery narrowing 02/08/2014   Depression    Detrusor hypertrophy    Diabetes (HCC)    Diabetes mellitus, type 2 (HCC) 12/12/2012   Diverticulosis    Dyspnea    Esophageal reflux    Esophageal reflux    Fothergill's neuralgia 08/07/2012   Overview:  [facility]    Gastritis    GERD (gastroesophageal reflux disease)    Headache    cluster headaches   Healed myocardial infarct 11/09/2012   Hearing loss in left ear    Heart disease    Hematuria    Hemorrhoids    History of hiatal hernia 12/14/2017   small    Hypercholesteremia    Lesion of bladder    Myocardial infarct (HCC)    Presence of stent in coronary artery 11/09/2012   Rectal bleeding     Trigeminal neuralgia    Trigeminal neuralgia    Valvular heart disease    Vitamin D deficiency    Past Surgical History:  Procedure Laterality Date   APPENDECTOMY     BOTOX INJECTION N/A 12/21/2017   Procedure: Bladder BOTOX INJECTION;  Surgeon: Vanna Scotland, MD;  Location: [facility] ORS;  Service: Urology;  Laterality: N/A;   BOTOX INJECTION N/A 09/11/2018   Procedure: Bladder BOTOX INJECTION;  Surgeon: Vanna Scotland, MD;  Location: [facility] ORS;  Service: Urology;  Laterality: N/A;   CARDIAC CATHETERIZATION     CARDIAC CATHETERIZATION N/A 01/22/2015   Procedure: Left Heart Cath;  Surgeon: Lamar Blinks, MD;  Location: [facility] INVASIVE CV LAB;  Service: Cardiovascular;  Laterality: N/A;   CARDIAC CATHETERIZATION N/A 01/22/2015   Procedure: Coronary Stent Intervention;  Surgeon: Marcina Millard, MD;  Location: [facility] INVASIVE CV LAB;  Service: Cardiovascular;  Laterality: N/A;   CATARACT EXTRACTION, BILATERAL     COLONOSCOPY WITH PROPOFOL N/A 12/08/2015   Procedure: COLONOSCOPY WITH PROPOFOL;  Surgeon: Christena Deem, MD;  Location: [facility] ENDOSCOPY;  Service: Endoscopy;  Laterality: N/A;  COLONOSCOPY WITH PROPOFOL N/A 12/09/2015   Procedure: COLONOSCOPY WITH PROPOFOL;  Surgeon: Christena Deem, MD;  Location: [facility] ENDOSCOPY;  Service: Endoscopy;  Laterality: N/A;   CORONARY ANGIOPLASTY     5 stents   CORONARY STENT PLACEMENT  2015   x5   CYSTOSCOPY N/A 09/11/2018   Procedure: CYSTOSCOPY;  Surgeon: Vanna Scotland, MD;  Location: [facility] ORS;  Service: Urology;  Laterality: N/A;   CYSTOSCOPY WITH BIOPSY N/A 12/21/2017   Procedure: CYSTOSCOPY WITH BIOPSY;  Surgeon: Vanna Scotland, MD;  Location: [facility] ORS;  Service: Urology;  Laterality: N/A;   ESOPHAGOGASTRODUODENOSCOPY (EGD) WITH PROPOFOL N/A 12/08/2015   Procedure: ESOPHAGOGASTRODUODENOSCOPY (EGD) WITH PROPOFOL;  Surgeon: Christena Deem, MD;  Location: [facility] ENDOSCOPY;  Service: Endoscopy;  Laterality: N/A;   ESOPHAGOGASTRODUODENOSCOPY  (EGD) WITH PROPOFOL N/A 12/13/2017   Procedure: ESOPHAGOGASTRODUODENOSCOPY (EGD) WITH PROPOFOL;  Surgeon: Christena Deem, MD;  Location: [facility] ENDOSCOPY;  Service: Endoscopy;  Laterality: N/A;   EYE SURGERY     HERNIA REPAIR     kidney tumor remove     TRANSURETHRAL RESECTION OF BLADDER TUMOR WITH GYRUS (TURBT-GYRUS)  12/2013   UMBILICAL HERNIA REPAIR     urethral meatotomy     Patient Active Problem List   Diagnosis Date Noted   Class 2 obesity due to excess calories with body mass index (BMI) of 36.0 to 36.9 in adult 04/11/2020   Swelling of limb 03/28/2020   Lymphedema 03/28/2020   Trigeminal neuralgia 12/25/2019   Lower limb ulcer, calf, left, limited to breakdown of skin (HCC) 12/25/2019   OSA (obstructive sleep apnea) 01/23/2019   Acute systolic CHF (congestive heart failure) (HCC) 12/12/2018   Bruising 07/10/2018   Diet-controlled type 2 diabetes mellitus (HCC) 03/24/2018   Microalbuminuria 03/24/2018   Dizziness 11/17/2017   Simple chronic bronchitis (HCC) 09/22/2017   SOBOE (shortness of breath on exertion) 02/18/2017   Chronic midline low back pain without sciatica 12/29/2016   Periodic limb movement disorder 12/29/2016   Benign essential tremor 06/22/2016   Pure hypercholesterolemia 06/20/2015   Erectile dysfunction due to arterial insufficiency 06/16/2015   Unstable angina (HCC) 01/22/2015   Abdominal aortic 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 rupture (HCC) 12/31/2014   Benign prostatic hyperplasia without urinary obstruction 07/31/2014   Enlarged prostate 07/31/2014   Benign essential HTN 06/11/2014   Carotid artery narrowing 02/08/2014   Carotid artery obstruction 02/08/2014   Bilateral carotid artery stenosis 02/08/2014   Chest pain 08/20/2013   Bilateral cataracts 05/16/2013   Cataract 05/16/2013   Clinical depression 12/12/2012   Diabetes mellitus, type 2 (HCC) 12/12/2012   Combined fat and carbohydrate induced hyperlipemia 12/12/2012   Major depressive disorder,  single episode, unspecified 12/12/2012   Acid reflux 11/09/2012   Presence of stent in coronary artery 11/09/2012   BP (high blood pressure) 11/09/2012   Healed myocardial infarct 11/09/2012   Gastro-esophageal reflux disease without esophagitis 11/09/2012   History of cardiovascular surgery 11/09/2012   Fothergill's neuralgia 08/07/2012   Swelling of testicle 04/06/2012   Disorder of male genital organ 04/06/2012   Swelling of the testicles 04/06/2012   Arteriosclerosis of coronary artery 11/16/2011   CAD in native artery 11/16/2011   Fatigue 06/04/2011   Avitaminosis D 11/30/2010    ONSET DATE: 03/17/2022- Worsening symptoms  REFERRING DIAG:  G20.A1 (ICD-10-CM) - Parkinsons  R26.2 (ICD-10-CM) - Difficulty walking    THERAPY DIAG:  Muscle weakness (generalized)  Difficulty in walking, not elsewhere classified  Other abnormalities of gait and mobility  Abnormality of gait and  mobility  Unsteadiness on feet  Other lack of coordination  Rationale for Evaluation and Treatment: Rehabilitation  SUBJECTIVE:                                                                                                                                                                                             SUBJECTIVE STATEMENT:  Pt reported 0/10 on NPS and no falls since last visit.    Pt accompanied by: significant other  PERTINENT HISTORY: Patient is a 77 year old male with referral to outpatient PT for Parkinsons and difficulty walking. He was recently seen by Neurology worsening symptoms of parkinson- difficulty with walking. He has past medical history of Arthritis, Bladder cancer, and Trigeminal Neuralgia.  PAIN:  Are you having pain? NO  PRECAUTIONS: Fall  WEIGHT BEARING RESTRICTIONS: No  FALLS: Has patient fallen in last 6 months? Yes. Number of falls 1  LIVING ENVIRONMENT: Lives with: lives with their spouse Lives in: House/apartment Stairs: Yes: External: 2 steps;  has grab bar Has following equipment at home: Dan Humphreys - 2 wheeled, Environmental consultant - 4 wheeled, and Grab bars  PLOF: Independent with household mobility with device, Independent with community mobility with device, Independent with transfers, and Requires assistive device for independence  PATIENT GOALS: Get to where I can walk better again and get my legs stronger.   OBJECTIVE:    TODAY'S TREATMENT:                                                                                                                              DATE: 01/31/23    Physical therapy treatment session today consisted of completing assessment of goals and administration of testing as demonstrated and documented in flow sheet, treatment, and goals section of this note. Addition treatments may be found below.     [facility] PT Assessment - 01/31/23 0001       Berg Balance Test   Sit to Stand Able to stand without using hands and stabilize independently    Standing Unsupported Able to stand safely 2 minutes  Sitting with Back Unsupported but Feet Supported on Floor or Stool Able to sit safely and securely 2 minutes    Stand to Sit Sits safely with minimal use of hands    Transfers Able to transfer safely, minor use of hands    Standing Unsupported with Eyes Closed Able to stand 10 seconds safely    Standing Unsupported with Feet Together Able to place feet together independently and stand for 1 minute with supervision    From Standing, Reach Forward with Outstretched Arm Can reach forward >12 cm safely (5")    From Standing Position, Pick up Object from Floor Able to pick up shoe, needs supervision    From Standing Position, Turn to Look Behind Over each Shoulder Turn sideways only but maintains balance    Turn 360 Degrees Able to turn 360 degrees safely but slowly    Standing Unsupported, Alternately Place Feet on Step/Stool Able to stand independently and safely and complete 8 steps in 20 seconds    Standing Unsupported, One  Foot in Front Able to take small step independently and hold 30 seconds    Standing on One Leg Tries to lift leg/unable to hold 3 seconds but remains standing independently    Total Score 44               PATIENT EDUCATION: Education details: Exercise technique Person educated: Patient Education method: Explanation, Demonstration, Tactile cues, Verbal cues, and Handouts Education comprehension: verbalized understanding, returned demonstration, verbal cues required, tactile cues required, and needs further education  HOME EXERCISE PROGRAM: Access Code: [email or web address] URL: [email or web address] Date: 08/11/2022 Prepared by: Maureen Ralphs  Exercises - Seated Lumbar Flexion Stretch  - 7 x weekly - 3 sets - 30 sec hold - Seated Thoracic Flexion and Rotation with Arms Crossed  - 1 x daily - 7 x weekly - 3 sets - 30 sec hold - Seated Figure 4 Piriformis Stretch  - 1 x daily - 7 x weekly - 3 sets - 10 reps  GOALS: Goals reviewed with patient? Yes  SHORT TERM GOALS: Target date: 09/20/2022  Pt will be independent with a comprehensive HEP in order to improve strength and balance in order to decrease fall risk and improve function at home and work.  Baseline: EVAL: Patient admits not performing much exercises - not moving around well. 10/25/2022-Patient reports not as compliant with previous HEP as instructed- Verbally reviewed Low back, cervical, thoracic stretching and importance of daily walking. 11/03/2022= Patient reports independent with cervical Stretches and some walking at home Goal status: MET   LONG TERM GOALS: Target date: 04/25/2023  Pt will improve FOTO to target score of 45 % to display perceived improvements in ability to complete ADL's.  Baseline: EVAL: 42: 11/03/2022= 55 Goal status: MET  2.  Pt will decrease 5TSTS by at least 5 seconds in order to demonstrate clinically significant improvement in LE strength.  Baseline: EVAL = 17.39 sec without UE support;  10/25/2022= 12.91 sec 12/2: 12.02 sec Goal status: MET  3. Pt will increase by at least 0.15 m/s in order to demonstrate clinically significant improvement in community ambulation   Baseline: EVAL= 0.67 m/s; 10/25/2022= 0.77 m/s 12/2: 0.84 m/s with 4WW Goal status: MET  4.  Pt will improve BERG by at least 3 points in order to demonstrate clinically significant improvement in balance.   Baseline: EVAL: to be assessed next visit: 08/23/2022= 36/56; 11/03/2022= 40/56 Goal status: MET  5.  Pt will decrease TUG to below  14 seconds/decrease in order to demonstrate decreased fall risk. Baseline: EVAL: 22.00 sec with upright 4WW; 10/25/2022= 15.98 sec with 4WW 12/2: 16.40 sec   Goal status: PROGRESSING  6.  Pt will increase by at least 46m (12ft) in order to demonstrate clinically significant improvement in cardiopulmonary endurance and community ambulation  Baseline: EVAL: 3:45 min (450 feet with upright 4WW) 10/25/2022= 830 feet with 4WW Goal status: MET  7.   Patient will report returning to walking in to community places using most appropriate assistive device vs current use of scooter for improved abilities with mobility during community outings Baseline: EVAL: Current using scooter for many social/community outings; 10/25/2022- Paitent continues to scooter for long distance community distances- due to some fatigue or SOB.  12/2: Only uses scooter when he has to ge tin the yard, does not use AD in home or garage majority of the time Goal status: PROGRESSING  8.  Pt will increase > 1000 feet in order to demonstrate clinically significant improvement in cardiopulmonary endurance and community ambulation   Baseline: 10/25/2022= 830 feet with 4WW 12/2: 919 feet with 4WW  Goal status= PROGRESSING  9.  Pt will improve BERG by at least 3 points in order to demonstrate clinically significant improvement in balance.   Baseline: 11/03/2022= 40/56 12/2: 44/56 Goal status: MET    10.  Pt will transition into gym-based exercise routine upon discharge in order to maintain progress of strength and balance  Baseline: 01/31/2023: Future interventions will involve therex in [facility] to prescribe pt proper exercise routine upon discharge  Goal Status: NEW    ASSESSMENT:  CLINICAL IMPRESSION: Physical therapy treatment session today consisted of completing assessment of goals and administration of testing. Pt made improvement in , while meeting the goals involving improving 5xSTS time, Berg Balance score and . The pt did not demonstrate a significant difference in TUG time. Pt reported only using a scooter when working in the yard and going down a Loss adjuster, chartered. Pt stated that he hardly ever uses an AD in his house or while working in his shop. A new goal was established to transition the pt to a gym-based exercise routine upon discharge in approximately 12 weeks. Patient's condition has the potential to improve in response to therapy. Maximum improvement is yet to be obtained. The anticipated improvement is attainable and reasonable in a generally predictable time.   Pt will continue to benefit from skilled PT services to address deficits and impairment identified in evaluation in order to maximize independence and safety in basic mobility required for performance of ADL, IADL, and leisure.    OBJECTIVE IMPAIRMENTS: Abnormal gait, cardiopulmonary status limiting activity, decreased activity tolerance, decreased balance, decreased coordination, decreased endurance, decreased mobility, difficulty walking, decreased ROM, decreased strength, hypomobility, impaired flexibility, impaired sensation, impaired UE functional use, postural dysfunction, obesity, and pain.   ACTIVITY LIMITATIONS: carrying, lifting, bending, standing, squatting, stairs, transfers, continence, and reach over head  PARTICIPATION LIMITATIONS: meal prep, cleaning, laundry, driving, shopping, community activity, and  yard work  PERSONAL FACTORS: Age and 3+ comorbidities: HTN, Arthritis, Low back pain, Trigeminal Neuralgia  are also affecting patient's functional outcome.   REHAB POTENTIAL: Good  CLINICAL DECISION MAKING: Evolving/moderate complexity  EVALUATION COMPLEXITY: Moderate  PLAN:  PT FREQUENCY: 1-2x/week  PT DURATION: 12 weeks  PLANNED INTERVENTIONS: Therapeutic exercises, Therapeutic activity, Neuromuscular re-education, Balance training, Gait training, Patient/Family education, Self Care, Joint mobilization, Stair training, Vestibular training, Canalith repositioning, DME instructions, Dry Needling, Electrical stimulation, Spinal manipulation, Spinal  mobilization, Cryotherapy, Moist heat, Manual therapy, and Re-evaluation  PLAN FOR NEXT SESSION: Progress therex, bed mobility, and balance training. Continue with  training in [facility] for UE/LE strengthening and cardiovascular training.   Debara Pickett, SPT 01/31/2023, 1:30 PM  This entire session was performed under direct supervision and direction of a licensed therapist/therapist assistant . I have personally read, edited and approve of the note as written.    1:30 PM, 01/31/23 Louis Meckel, PT Physical Therapist - [place] [facility]  Outpatient Physical Therapy- Main Campus [phone]

## 2023-02-02 ENCOUNTER — Ambulatory Visit: Payer: Medicare HMO

## 2023-02-02 DIAGNOSIS — R2689 Other abnormalities of gait and mobility: Secondary | ICD-10-CM | POA: Diagnosis not present

## 2023-02-02 DIAGNOSIS — R2681 Unsteadiness on feet: Secondary | ICD-10-CM | POA: Diagnosis not present

## 2023-02-02 DIAGNOSIS — R269 Unspecified abnormalities of gait and mobility: Secondary | ICD-10-CM | POA: Diagnosis not present

## 2023-02-02 DIAGNOSIS — R278 Other lack of coordination: Secondary | ICD-10-CM | POA: Diagnosis not present

## 2023-02-02 DIAGNOSIS — M545 Low back pain, unspecified: Secondary | ICD-10-CM | POA: Diagnosis not present

## 2023-02-02 DIAGNOSIS — G8929 Other chronic pain: Secondary | ICD-10-CM | POA: Diagnosis not present

## 2023-02-02 DIAGNOSIS — M6281 Muscle weakness (generalized): Secondary | ICD-10-CM | POA: Diagnosis not present

## 2023-02-02 DIAGNOSIS — R262 Difficulty in walking, not elsewhere classified: Secondary | ICD-10-CM | POA: Diagnosis not present

## 2023-02-02 NOTE — Therapy (Signed)
OUTPATIENT PHYSICAL THERAPY NEURO TREATMENT/ Physical Therapy Progress Note   Dates of reporting period  11/29/2022   to   02/02/2023     Patient Name: [name] MRN: [id number] DOB:1945-09-25, 77 y.o., male Today's Date: 02/02/2023   PCP: Ardyth Man, PA- C REFERRING PROVIDER: Morene Crocker, MD  END OF SESSION:  PT End of Session - 02/02/23 1055     Visit Number 40    Number of Visits 63    Date for PT Re-Evaluation 04/25/23    Authorization Type [employer]    Progress Note Due on Visit 50    PT Start Time 1100    PT Stop Time 1145    PT Time Calculation (min) 45 min    Equipment Utilized During Treatment Gait belt    Activity Tolerance Patient limited by pain    Behavior During Therapy [facility] for tasks assessed/performed                             Past Medical History:  Diagnosis Date   Anxiety    Arteriosclerosis of coronary artery 11/16/2011   Overview:  Stent 10/2011 stent rca 2015 with collaterals to lad which is chronically occluded    Benign enlargement of prostate    Benign essential HTN 06/11/2014   Benign prostatic hypertrophy without urinary obstruction 07/31/2014   Bilateral cataracts 05/16/2013   Overview:  [facility]     Bone spur of foot    Left   BP (high blood pressure) 11/09/2012   Cancer (HCC)    skin (forehead) and bladder   Carotid artery narrowing 02/08/2014   Depression    Detrusor hypertrophy    Diabetes (HCC)    Diabetes mellitus, type 2 (HCC) 12/12/2012   Diverticulosis    Dyspnea    Esophageal reflux    Esophageal reflux    Fothergill's neuralgia 08/07/2012   Overview:  [facility]    Gastritis    GERD (gastroesophageal reflux disease)    Headache    cluster headaches   Healed myocardial infarct 11/09/2012   Hearing loss in left ear    Heart disease    Hematuria    Hemorrhoids    History of hiatal hernia 12/14/2017   small    Hypercholesteremia    Lesion of bladder    Myocardial  infarct (HCC)    Presence of stent in coronary artery 11/09/2012   Rectal bleeding    Trigeminal neuralgia    Trigeminal neuralgia    Valvular heart disease    Vitamin D deficiency    Past Surgical History:  Procedure Laterality Date   APPENDECTOMY     BOTOX INJECTION N/A 12/21/2017   Procedure: Bladder BOTOX INJECTION;  Surgeon: Vanna Scotland, MD;  Location: [facility] ORS;  Service: Urology;  Laterality: N/A;   BOTOX INJECTION N/A 09/11/2018   Procedure: Bladder BOTOX INJECTION;  Surgeon: Vanna Scotland, MD;  Location: [facility] OR[facility];  Service: Urology;  Laterality: N/A;   CARDIAC CATHETERIZATION     CARDIAC CATHETERIZATION N/A 01/22/2015   Procedure: Left Heart Cath;  Surgeon: Lamar Blinks, MD;  Location: [facility] INVASIVE CV LAB;  Service: Cardiovascular;  Laterality: N/A;   CARDIAC CATHETERIZATION N/A 01/22/2015   Procedure: Coronary Stent Intervention;  Surgeon: Marcina Millard, MD;  Location: [facility] INVASIVE CV LAB;  Service: Cardiovascular;  Laterality: N/A;   CATARACT EXTRACTION, BILATERAL     COLONOSCOPY WITH PROPOFOL N/A 12/08/2015  Procedure: COLONOSCOPY WITH PROPOFOL;  Surgeon: Christena Deem, MD;  Location: [facility] ENDOSCOPY;  Service: Endoscopy;  Laterality: N/A;   COLONOSCOPY WITH PROPOFOL N/A 12/09/2015   Procedure: COLONOSCOPY WITH PROPOFOL;  Surgeon: Christena Deem, MD;  Location: [facility] ENDOSCOPY;  Service: Endoscopy;  Laterality: N/A;   CORONARY ANGIOPLASTY     5 stents   CORONARY STENT PLACEMENT  2015   x5   CYSTOSCOPY N/A 09/11/2018   Procedure: CYSTOSCOPY;  Surgeon: Vanna Scotland, MD;  Location: [facility] ORS;  Service: Urology;  Laterality: N/A;   CYSTOSCOPY WITH BIOPSY N/A 12/21/2017   Procedure: CYSTOSCOPY WITH BIOPSY;  Surgeon: Vanna Scotland, MD;  Location: [facility] ORS;  Service: Urology;  Laterality: N/A;   ESOPHAGOGASTRODUODENOSCOPY (EGD) WITH PROPOFOL N/A 12/08/2015   Procedure: ESOPHAGOGASTRODUODENOSCOPY (EGD) WITH PROPOFOL;  Surgeon: Christena Deem, MD;   Location: [facility] ENDOSCOPY;  Service: Endoscopy;  Laterality: N/A;   ESOPHAGOGASTRODUODENOSCOPY (EGD) WITH PROPOFOL N/A 12/13/2017   Procedure: ESOPHAGOGASTRODUODENOSCOPY (EGD) WITH PROPOFOL;  Surgeon: Christena Deem, MD;  Location: [facility]OSCOPY;  Service: Endoscopy;  Laterality: N/A;   EYE SURGERY     HERNIA REPAIR     kidney tumor remove     TRANSURETHRAL RESECTION OF BLADDER TUMOR WITH GYRUS (TURBT-GYRUS)  12/2013   UMBILICAL HERNIA REPAIR     urethral meatotomy     Patient Active Problem List   Diagnosis Date Noted   Class 2 obesity due to excess calories with body mass index (BMI) of 36.0 to 36.9 in adult 04/11/2020   Swelling of limb 03/28/2020   Lymphedema 03/28/2020   Trigeminal neuralgia 12/25/2019   Lower limb ulcer, calf, left, limited to breakdown of skin (HCC) 12/25/2019   OSA (obstructive sleep apnea) 01/23/2019   Acute systolic CHF (congestive heart failure) (HCC) 12/12/2018   Bruising 07/10/2018   Diet-controlled type 2 diabetes mellitus (HCC) 03/24/2018   Microalbuminuria 03/24/2018   Dizziness 11/17/2017   Simple chronic bronchitis (HCC) 09/22/2017   SOBOE (shortness of breath on exertion) 02/18/2017   Chronic midline low back pain without sciatica 12/29/2016   Periodic limb movement disorder 12/29/2016   Benign essential tremor 06/22/2016   Pure hypercholesterolemia 06/20/2015   Erectile dysfunction due to arterial insufficiency 06/16/2015   Unstable angina (HCC) 01/22/2015   Abdominal aortic 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 rupture (HCC) 12/31/2014   Benign prostatic hyperplasia without urinary obstruction 07/31/2014   Enlarged prostate 07/31/2014   Benign essential HTN 06/11/2014   Carotid artery narrowing 02/08/2014   Carotid artery obstruction 02/08/2014   Bilateral carotid artery stenosis 02/08/2014   Chest pain 08/20/2013   Bilateral cataracts 05/16/2013   Cataract 05/16/2013   Clinical depression 12/12/2012   Diabetes mellitus, type 2 (HCC) 12/12/2012    Combined fat and carbohydrate induced hyperlipemia 12/12/2012   Major depressive disorder, single episode, unspecified 12/12/2012   Acid reflux 11/09/2012   Presence of stent in coronary artery 11/09/2012   BP (high blood pressure) 11/09/2012   Healed myocardial infarct 11/09/2012   Gastro-esophageal reflux disease without esophagitis 11/09/2012   History of cardiovascular surgery 11/09/2012   Fothergill's neuralgia 08/07/2012   Swelling of testicle 04/06/2012   Disorder of male genital organ 04/06/2012   Swelling of the testicles 04/06/2012   Arteriosclerosis of coronary artery 11/16/2011   CAD in native artery 11/16/2011   Fatigue 06/04/2011   Avitaminosis D 11/30/2010    ONSET DATE: 03/17/2022- Worsening symptoms  REFERRING DIAG:  G20.A1 (ICD-10-CM) - Parkinsons  R26.2 (ICD-10-CM) - Difficulty walking    THERAPY DIAG:  Muscle weakness (generalized)  Difficulty in walking, not elsewhere classified  Other abnormalities of gait and mobility  Abnormality of gait and mobility  Unsteadiness on feet  Other lack of coordination  Chronic bilateral low back pain without sciatica  Rationale for Evaluation and Treatment: Rehabilitation  SUBJECTIVE:                                                                                                                                                                                             SUBJECTIVE STATEMENT:  Patient reports still doing well after the injection and no pain. He denies any falls and states well pleased with his progress during testing from last session  Pt accompanied by: significant other  PERTINENT HISTORY: Patient is a 77 year old male with referral to outpatient PT for Parkinsons and difficulty walking. He was recently seen by Neurology worsening symptoms of parkinson- difficulty with walking. He has past medical history of Arthritis, Bladder cancer, and Trigeminal Neuralgia.  PAIN:  Are you having pain?  NO  PRECAUTIONS: Fall  WEIGHT BEARING RESTRICTIONS: No  FALLS: Has patient fallen in last 6 months? Yes. Number of falls 1  LIVING ENVIRONMENT: Lives with: lives with their spouse Lives in: House/apartment Stairs: Yes: External: 2 steps; has grab bar Has following equipment at home: Dan Humphreys - 2 wheeled, Environmental consultant - 4 wheeled, and Grab bars  PLOF: Independent with household mobility with device, Independent with community mobility with device, Independent with transfers, and Requires assistive device for independence  PATIENT GOALS: Get to where I can walk better again and get my legs stronger.   OBJECTIVE:    TODAY'S TREATMENT:                                                                                                                              DATE: 02/02/23    NMR:   Combo activity- Sit to stand then large step forward walk - then retro walk back with stand to sit x 10 reps (at best- 7 steps counted forward and 7 retro)  Walking in //  bars- lunge step forward +trunk rotation and large amplitude (arm out to side) then clap- then step together - then repeat on opp side and repeat til make it to end of bars- turn around and repeat x 3 total laps.   Sit to stand  with UE raises (for posture)  2 sets x 10 reps   Resistive gait with 4# AW x 300 feet using upright 4WW, CGA - VC for larger step length. (More shuffling with distracted or communicating)  Ham curl walk with 4# (standing in // bars) down and back x 1  Forward/retro step up/over orange hurdle x 20 reps with 4# AW alt LE       PATIENT EDUCATION: Education details: Exercise technique Person educated: Patient Education method: Explanation, Demonstration, Tactile cues, Verbal cues, and Handouts Education comprehension: verbalized understanding, returned demonstration, verbal cues required, tactile cues required, and needs further education  HOME EXERCISE PROGRAM: Access Code: [email or web address] URL:  [email or web address] Date: 08/11/2022 Prepared by: Maureen Ralphs  Exercises - Seated Lumbar Flexion Stretch  - 7 x weekly - 3 sets - 30 sec hold - Seated Thoracic Flexion and Rotation with Arms Crossed  - 1 x daily - 7 x weekly - 3 sets - 30 sec hold - Seated Figure 4 Piriformis Stretch  - 1 x daily - 7 x weekly - 3 sets - 10 reps  GOALS: Goals reviewed with patient? Yes  SHORT TERM GOALS: Target date: 09/20/2022  Pt will be independent with a comprehensive HEP in order to improve strength and balance in order to decrease fall risk and improve function at home and work.  Baseline: EVAL: Patient admits not performing much exercises - not moving around well. 10/25/2022-Patient reports not as compliant with previous HEP as instructed- Verbally reviewed Low back, cervical, thoracic stretching and importance of daily walking. 11/03/2022= Patient reports independent with cervical Stretches and some walking at home Goal status: MET   LONG TERM GOALS: Target date: 04/25/2023  Pt will improve FOTO to target score of 45 % to display perceived improvements in ability to complete ADL's.  Baseline: EVAL: 42: 11/03/2022= 55 Goal status: MET  2.  Pt will decrease 5TSTS by at least 5 seconds in order to demonstrate clinically significant improvement in LE strength.  Baseline: EVAL = 17.39 sec without UE support; 10/25/2022= 12.91 sec 12/2: 12.02 sec Goal status: MET  3. Pt will increase by at least 0.15 m/s in order to demonstrate clinically significant improvement in community ambulation   Baseline: EVAL= 0.67 m/s; 10/25/2022= 0.77 m/s 12/2: 0.84 m/s with 4WW Goal status: MET  4.  Pt will improve BERG by at least 3 points in order to demonstrate clinically significant improvement in balance.   Baseline: EVAL: to be assessed next visit: 08/23/2022= 36/56; 11/03/2022= 40/56 Goal status: MET  5.  Pt will decrease TUG to below 14 seconds/decrease in order to demonstrate decreased  fall risk. Baseline: EVAL: 22.00 sec with upright 4WW; 10/25/2022= 15.98 sec with 4WW 12/2: 16.40 sec   Goal status: PROGRESSING  6.  Pt will increase by at least 40m (152ft) in order to demonstrate clinically significant improvement in cardiopulmonary endurance and community ambulation  Baseline: EVAL: 3:45 min (450 feet with upright 4WW) 10/25/2022= 830 feet with 4WW Goal status: MET  7.   Patient will report returning to walking in to community places using most appropriate assistive device vs current use of scooter for improved abilities with mobility during community outings Baseline: EVAL: Current using scooter  for many social/community outings; 10/25/2022- Paitent continues to scooter for long distance community distances- due to some fatigue or SOB.  12/2: Only uses scooter when he has to ge tin the yard, does not use AD in home or garage majority of the time Goal status: PROGRESSING  8.  Pt will increase > 1000 feet in order to demonstrate clinically significant improvement in cardiopulmonary endurance and community ambulation   Baseline: 10/25/2022= 830 feet with 4WW 12/2: 919 feet with 4WW  Goal status= PROGRESSING  9.  Pt will improve BERG by at least 3 points in order to demonstrate clinically significant improvement in balance.   Baseline: 11/03/2022= 40/56 12/2: 44/56 Goal status: MET    10. Pt will transition into gym-based exercise routine upon discharge in order to maintain progress of strength and balance  Baseline: 01/31/2023: Future interventions will involve therex in [facility] to prescribe pt proper exercise routine upon discharge  Goal Status: NEW    ASSESSMENT:  CLINICAL IMPRESSION: Patient arrived with good motivation and continues to deny pain- able to progress with dynamic walking without any report of pain. He continues to require VC to increase step length -with worsening shuffle if distracted. He did improve with specific mobility activities designed  to promote increased step length. Did not reassess goals for progress note as patient's goals were just modified/reassessed last visit during recert. Patient's condition has the potential to improve in response to therapy. Maximum improvement is yet to be obtained. The anticipated improvement is attainable and reasonable in a generally predictable time. Pt will continue to benefit from skilled PT services to address deficits and impairment identified in evaluation in order to maximize independence and safety in basic mobility required for performance of ADL, IADL, and leisure.    OBJECTIVE IMPAIRMENTS: Abnormal gait, cardiopulmonary status limiting activity, decreased activity tolerance, decreased balance, decreased coordination, decreased endurance, decreased mobility, difficulty walking, decreased ROM, decreased strength, hypomobility, impaired flexibility, impaired sensation, impaired UE functional use, postural dysfunction, obesity, and pain.   ACTIVITY LIMITATIONS: carrying, lifting, bending, standing, squatting, stairs, transfers, continence, and reach over head  PARTICIPATION LIMITATIONS: meal prep, cleaning, laundry, driving, shopping, community activity, and yard work  PERSONAL FACTORS: Age and 3+ comorbidities: HTN, Arthritis, Low back pain, Trigeminal Neuralgia  are also affecting patient's functional outcome.   REHAB POTENTIAL: Good  CLINICAL DECISION MAKING: Evolving/moderate complexity  EVALUATION COMPLEXITY: Moderate  PLAN:  PT FREQUENCY: 1-2x/week  PT DURATION: 12 weeks  PLANNED INTERVENTIONS: Therapeutic exercises, Therapeutic activity, Neuromuscular re-education, Balance training, Gait training, Patient/Family education, Self Care, Joint mobilization, Stair training, Vestibular training, Canalith repositioning, DME instructions, Dry Needling, Electrical stimulation, Spinal manipulation, Spinal mobilization, Cryotherapy, Moist heat, Manual therapy, and Re-evaluation  PLAN  FOR NEXT SESSION: Progress therex and balance training. Continue with  training in [place] for UE/LE strengthening and cardiovascular training.      1:17 PM, 02/02/23 Louis Meckel, PT Physical Therapist - [facility]  Outpatient Physical Therapy- Main Campus [phone]

## 2023-02-07 ENCOUNTER — Ambulatory Visit: Payer: Medicare HMO

## 2023-02-07 DIAGNOSIS — M545 Low back pain, unspecified: Secondary | ICD-10-CM | POA: Diagnosis not present

## 2023-02-07 DIAGNOSIS — R278 Other lack of coordination: Secondary | ICD-10-CM

## 2023-02-07 DIAGNOSIS — M6281 Muscle weakness (generalized): Secondary | ICD-10-CM | POA: Diagnosis not present

## 2023-02-07 DIAGNOSIS — R2689 Other abnormalities of gait and mobility: Secondary | ICD-10-CM

## 2023-02-07 DIAGNOSIS — R2681 Unsteadiness on feet: Secondary | ICD-10-CM | POA: Diagnosis not present

## 2023-02-07 DIAGNOSIS — G8929 Other chronic pain: Secondary | ICD-10-CM | POA: Diagnosis not present

## 2023-02-07 DIAGNOSIS — R262 Difficulty in walking, not elsewhere classified: Secondary | ICD-10-CM | POA: Diagnosis not present

## 2023-02-07 DIAGNOSIS — R269 Unspecified abnormalities of gait and mobility: Secondary | ICD-10-CM | POA: Diagnosis not present

## 2023-02-07 NOTE — Therapy (Signed)
OUTPATIENT PHYSICAL THERAPY NEURO TREATMENT   Patient Name: [name] MRN: [id number] DOB:27-Aug-1945, 77 y.o., male Today's Date: 02/07/2023   PCP: Ardyth Man, PA- C REFERRING PROVIDER: Morene Crocker, MD  END OF SESSION:  PT End of Session - 02/07/23 1057     Visit Number 41    Number of Visits 63    Date for PT Re-Evaluation 04/25/23    Authorization Type [employer]    Progress Note Due on Visit 50    PT Start Time 1100    PT Stop Time 1143    PT Time Calculation (min) 43 min    Equipment Utilized During Treatment Gait belt    Activity Tolerance Patient limited by pain    Behavior During Therapy [facility] for tasks assessed/performed                              Past Medical History:  Diagnosis Date   Anxiety    Arteriosclerosis of coronary artery 11/16/2011   Overview:  Stent 10/2011 stent rca 2015 with collaterals to lad which is chronically occluded    Benign enlargement of prostate    Benign essential HTN 06/11/2014   Benign prostatic hypertrophy without urinary obstruction 07/31/2014   Bilateral cataracts 05/16/2013   Overview:  [facility]     Bone spur of foot    Left   BP (high blood pressure) 11/09/2012   Cancer (HCC)    skin (forehead) and bladder   Carotid artery narrowing 02/08/2014   Depression    Detrusor hypertrophy    Diabetes (HCC)    Diabetes mellitus, type 2 (HCC) 12/12/2012   Diverticulosis    Dyspnea    Esophageal reflux    Esophageal reflux    Fothergill's neuralgia 08/07/2012   Overview:  [facility]    Gastritis    GERD (gastroesophageal reflux disease)    Headache    cluster headaches   Healed myocardial infarct 11/09/2012   Hearing loss in left ear    Heart disease    Hematuria    Hemorrhoids    History of hiatal hernia 12/14/2017   small    Hypercholesteremia    Lesion of bladder    Myocardial infarct (HCC)    Presence of stent in coronary artery 11/09/2012   Rectal bleeding     Trigeminal neuralgia    Trigeminal neuralgia    Valvular heart disease    Vitamin D deficiency    Past Surgical History:  Procedure Laterality Date   APPENDECTOMY     BOTOX INJECTION N/A 12/21/2017   Procedure: Bladder BOTOX INJECTION;  Surgeon: Vanna Scotland, MD;  Location: [facility] ORS;  Service: Urology;  Laterality: N/A;   BOTOX INJECTION N/A 09/11/2018   Procedure: Bladder BOTOX INJECTION;  Surgeon: Vanna Scotland, MD;  Location: [facility] ORS;  Service: Urology;  Laterality: N/A;   CARDIAC CATHETERIZATION     CARDIAC CATHETERIZATION N/A 01/22/2015   Procedure: Left Heart Cath;  Surgeon: Lamar Blinks, MD;  Location: [facility] INVASIVE CV LAB;  Service: Cardiovascular;  Laterality: N/A;   CARDIAC CATHETERIZATION N/A 01/22/2015   Procedure: Coronary Stent Intervention;  Surgeon: Marcina Millard, MD;  Location: [facility] INVASIVE CV LAB;  Service: Cardiovascular;  Laterality: N/A;   CATARACT EXTRACTION, BILATERAL     COLONOSCOPY WITH PROPOFOL N/A 12/08/2015   Procedure: COLONOSCOPY WITH PROPOFOL;  Surgeon: Christena Deem, MD;  Location: [facility] ENDOSCOPY;  Service: Endoscopy;  Laterality:  N/A;   COLONOSCOPY WITH PROPOFOL N/A 12/09/2015   Procedure: COLONOSCOPY WITH PROPOFOL;  Surgeon: Christena Deem, MD;  Location: [facility] ENDOSCOPY;  Service: Endoscopy;  Laterality: N/A;   CORONARY ANGIOPLASTY     5 stents   CORONARY STENT PLACEMENT  2015   x5   CYSTOSCOPY N/A 09/11/2018   Procedure: CYSTOSCOPY;  Surgeon: Vanna Scotland, MD;  Location: [facility] ORS;  Service: Urology;  Laterality: N/A;   CYSTOSCOPY WITH BIOPSY N/A 12/21/2017   Procedure: CYSTOSCOPY WITH BIOPSY;  Surgeon: Vanna Scotland, MD;  Location: [facility] ORS;  Service: Urology;  Laterality: N/A;   ESOPHAGOGASTRODUODENOSCOPY (EGD) WITH PROPOFOL N/A 12/08/2015   Procedure: ESOPHAGOGASTRODUODENOSCOPY (EGD) WITH PROPOFOL;  Surgeon: Christena Deem, MD;  Location: [facility] ENDOSCOPY;  Service: Endoscopy;  Laterality: N/A;   ESOPHAGOGASTRODUODENOSCOPY  (EGD) WITH PROPOFOL N/A 12/13/2017   Procedure: ESOPHAGOGASTRODUODENOSCOPY (EGD) WITH PROPOFOL;  Surgeon: Christena Deem, MD;  Location: [facility] ENDOSCOPY;  Service: Endoscopy;  Laterality: N/A;   EYE SURGERY     HERNIA REPAIR     kidney tumor remove     TRANSURETHRAL RESECTION OF BLADDER TUMOR WITH GYRUS (TURBT-GYRUS)  12/2013   UMBILICAL HERNIA REPAIR     urethral meatotomy     Patient Active Problem List   Diagnosis Date Noted   Class 2 obesity due to excess calories with body mass index (BMI) of 36.0 to 36.9 in adult 04/11/2020   Swelling of limb 03/28/2020   Lymphedema 03/28/2020   Trigeminal neuralgia 12/25/2019   Lower limb ulcer, calf, left, limited to breakdown of skin (HCC) 12/25/2019   OSA (obstructive sleep apnea) 01/23/2019   Acute systolic CHF (congestive heart failure) (HCC) 12/12/2018   Bruising 07/10/2018   Diet-controlled type 2 diabetes mellitus (HCC) 03/24/2018   Microalbuminuria 03/24/2018   Dizziness 11/17/2017   Simple chronic bronchitis (HCC) 09/22/2017   SOBOE (shortness of breath on exertion) 02/18/2017   Chronic midline low back pain without sciatica 12/29/2016   Periodic limb movement disorder 12/29/2016   Benign essential tremor 06/22/2016   Pure hypercholesterolemia 06/20/2015   Erectile dysfunction due to arterial insufficiency 06/16/2015   Unstable angina (HCC) 01/22/2015   Abdominal aortic 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 rupture (HCC) 12/31/2014   Benign prostatic hyperplasia without urinary obstruction 07/31/2014   Enlarged prostate 07/31/2014   Benign essential HTN 06/11/2014   Carotid artery narrowing 02/08/2014   Carotid artery obstruction 02/08/2014   Bilateral carotid artery stenosis 02/08/2014   Chest pain 08/20/2013   Bilateral cataracts 05/16/2013   Cataract 05/16/2013   Clinical depression 12/12/2012   Diabetes mellitus, type 2 (HCC) 12/12/2012   Combined fat and carbohydrate induced hyperlipemia 12/12/2012   Major depressive disorder,  single episode, unspecified 12/12/2012   Acid reflux 11/09/2012   Presence of stent in coronary artery 11/09/2012   BP (high blood pressure) 11/09/2012   Healed myocardial infarct 11/09/2012   Gastro-esophageal reflux disease without esophagitis 11/09/2012   History of cardiovascular surgery 11/09/2012   Fothergill's neuralgia 08/07/2012   Swelling of testicle 04/06/2012   Disorder of male genital organ 04/06/2012   Swelling of the testicles 04/06/2012   Arteriosclerosis of coronary artery 11/16/2011   CAD in native artery 11/16/2011   Fatigue 06/04/2011   Avitaminosis D 11/30/2010    ONSET DATE: 03/17/2022- Worsening symptoms  REFERRING DIAG:  G20.A1 (ICD-10-CM) - Parkinsons  R26.2 (ICD-10-CM) - Difficulty walking    THERAPY DIAG:  Muscle weakness (generalized)  Difficulty in walking, not elsewhere classified  Other abnormalities of gait and mobility  Abnormality  of gait and mobility  Unsteadiness on feet  Other lack of coordination  Rationale for Evaluation and Treatment: Rehabilitation  SUBJECTIVE:                                                                                                                                                                                             SUBJECTIVE STATEMENT:  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 Pt reported no falls since last visit and 0/10 on NPS.   Pt accompanied by: significant other  PERTINENT HISTORY: Patient is a 77 year old male with referral to outpatient PT for Parkinsons and difficulty walking. He was recently seen by Neurology worsening symptoms of parkinson- difficulty with walking. He has past medical history of Arthritis, Bladder cancer, and Trigeminal Neuralgia.  PAIN:  Are you having pain? NO  PRECAUTIONS: Fall  WEIGHT BEARING RESTRICTIONS: No  FALLS: Has patient fallen in last 6 months? Yes. Number of falls 1  LIVING ENVIRONMENT: Lives with: lives with their  spouse Lives in: House/apartment Stairs: Yes: External: 2 steps; has grab bar Has following equipment at home: Dan Humphreys - 2 wheeled, Environmental consultant - 4 wheeled, and Grab bars  PLOF: Independent with household mobility with device, Independent with community mobility with device, Independent with transfers, and Requires assistive device for independence  PATIENT GOALS: Get to where I can walk better again and get my legs stronger.   OBJECTIVE:    TODAY'S TREATMENT:                                                                                                                              DATE: 02/07/23    NMR:   Combo activity- Sit to stand then large step forward walk - then retro walk back with stand to sit x 10 reps (at best- 7 steps counted forward and 7 retro)  Walking in // bars- lunge step forward + UE raises - 2 laps  Forward/retro step up/over orange hurdle x 20 reps with 4# AW alt LE (VC's to pick up feet as  uch as possible to prevent knocking hurdle down)  TE  Sit to stand without UE support 2 sets x 10 reps   Resistive gait with 4# AW 2x300 feet using upright 4WW, CGA - VC for larger step length. (More shuffling with distracted or communicating)  LAQ 4# AW 2x10 each LE     PATIENT EDUCATION: Education details: Exercise technique Person educated: Patient Education method: Explanation, Demonstration, Tactile cues, Verbal cues, and Handouts Education comprehension: verbalized understanding, returned demonstration, verbal cues required, tactile cues required, and needs further education  HOME EXERCISE PROGRAM: Access Code: [email or web address] URL: [email or web address] Date: 08/11/2022 Prepared by: Maureen Ralphs  Exercises - Seated Lumbar Flexion Stretch  - 7 x weekly - 3 sets - 30 sec hold - Seated Thoracic Flexion and Rotation with Arms Crossed  - 1 x daily - 7 x weekly - 3 sets - 30 sec hold - Seated Figure 4 Piriformis Stretch  - 1 x daily - 7 x weekly - 3  sets - 10 reps  GOALS: Goals reviewed with patient? Yes  SHORT TERM GOALS: Target date: 09/20/2022  Pt will be independent with a comprehensive HEP in order to improve strength and balance in order to decrease fall risk and improve function at home and work.  Baseline: EVAL: Patient admits not performing much exercises - not moving around well. 10/25/2022-Patient reports not as compliant with previous HEP as instructed- Verbally reviewed Low back, cervical, thoracic stretching and importance of daily walking. 11/03/2022= Patient reports independent with cervical Stretches and some walking at home Goal status: MET   LONG TERM GOALS: Target date: 04/25/2023  Pt will improve FOTO to target score of 45 % to display perceived improvements in ability to complete ADL's.  Baseline: EVAL: 42: 11/03/2022= 55 Goal status: MET  2.  Pt will decrease 5TSTS by at least 5 seconds in order to demonstrate clinically significant improvement in LE strength.  Baseline: EVAL = 17.39 sec without UE support; 10/25/2022= 12.91 sec 12/2: 12.02 sec Goal status: MET  3. Pt will increase by at least 0.15 m/s in order to demonstrate clinically significant improvement in community ambulation   Baseline: EVAL= 0.67 m/s; 10/25/2022= 0.77 m/s 12/2: 0.84 m/s with 4WW Goal status: MET  4.  Pt will improve BERG by at least 3 points in order to demonstrate clinically significant improvement in balance.   Baseline: EVAL: to be assessed next visit: 08/23/2022= 36/56; 11/03/2022= 40/56 Goal status: MET  5.  Pt will decrease TUG to below 14 seconds/decrease in order to demonstrate decreased fall risk. Baseline: EVAL: 22.00 sec with upright 4WW; 10/25/2022= 15.98 sec with 4WW 12/2: 16.40 sec   Goal status: PROGRESSING  6.  Pt will increase by at least 55m (144ft) in order to demonstrate clinically significant improvement in cardiopulmonary endurance and community ambulation  Baseline: EVAL: 3:45 min (450 feet with upright  4WW) 10/25/2022= 830 feet with 4WW Goal status: MET  7.   Patient will report returning to walking in to community places using most appropriate assistive device vs current use of scooter for improved abilities with mobility during community outings Baseline: EVAL: Current using scooter for many social/community outings; 10/25/2022- Paitent continues to scooter for long distance community distances- due to some fatigue or SOB.  12/2: Only uses scooter when he has to ge tin the yard, does not use AD in home or garage majority of the time Goal status: PROGRESSING  8.  Pt will increase > 1000 feet in order to  demonstrate clinically significant improvement in cardiopulmonary endurance and community ambulation   Baseline: 10/25/2022= 830 feet with 4WW 12/2: 919 feet with 4WW  Goal status= PROGRESSING  9.  Pt will improve BERG by at least 3 points in order to demonstrate clinically significant improvement in balance.   Baseline: 11/03/2022= 40/56 12/2: 44/56 Goal status: MET    10. Pt will transition into gym-based exercise routine upon discharge in order to maintain progress of strength and balance  Baseline: 01/31/2023: Future interventions will involve therex in [facility] to prescribe pt proper exercise routine upon discharge  Goal Status: NEW    ASSESSMENT:  CLINICAL IMPRESSION: Pt tolerated all tasks during today's visit. 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 Pt is still demonstrating difficulty with foot clearance over the orange hurdle, as he continuously kept knocking the hurdle over. VC's required to pick up his feet to clear the hurdle. Pt will continue to benefit from skilled PT services to address deficits and impairment identified in evaluation in order to maximize independence and safety in basic mobility required for performance of ADL, IADL, and leisure.    OBJECTIVE IMPAIRMENTS: Abnormal  gait, cardiopulmonary status limiting activity, decreased activity tolerance, decreased balance, decreased coordination, decreased endurance, decreased mobility, difficulty walking, decreased ROM, decreased strength, hypomobility, impaired flexibility, impaired sensation, impaired UE functional use, postural dysfunction, obesity, and pain.   ACTIVITY LIMITATIONS: carrying, lifting, bending, standing, squatting, stairs, transfers, continence, and reach over head  PARTICIPATION LIMITATIONS: meal prep, cleaning, laundry, driving, shopping, community activity, and yard work  PERSONAL FACTORS: Age and 3+ comorbidities: HTN, Arthritis, Low back pain, Trigeminal Neuralgia  are also affecting patient's functional outcome.   REHAB POTENTIAL: Good  CLINICAL DECISION MAKING: Evolving/moderate complexity  EVALUATION COMPLEXITY: Moderate  PLAN:  PT FREQUENCY: 1-2x/week  PT DURATION: 12 weeks  PLANNED INTERVENTIONS: Therapeutic exercises, Therapeutic activity, Neuromuscular re-education, Balance training, Gait training, Patient/Family education, Self Care, Joint mobilization, Stair training, Vestibular training, Canalith repositioning, DME instructions, Dry Needling, Electrical stimulation, Spinal manipulation, Spinal mobilization, Cryotherapy, Moist heat, Manual therapy, and Re-evaluation  PLAN FOR NEXT SESSION: Progress therex and balance training. Continue with  training in [place] for UE/LE strengthening and cardiovascular training.    Debara Pickett, SPT This entire session was performed under direct supervision and direction of a licensed Estate agent . I have personally read, edited and approve of the note as written.    2:06 PM, 02/07/23 Louis Meckel, PT Physical Therapist - [facility]  Outpatient Physical Therapy- Main Campus [phone]

## 2023-02-09 ENCOUNTER — Encounter: Payer: Self-pay | Admitting: Physical Therapy

## 2023-02-09 ENCOUNTER — Ambulatory Visit: Payer: Medicare HMO | Admitting: Physical Therapy

## 2023-02-09 DIAGNOSIS — R2681 Unsteadiness on feet: Secondary | ICD-10-CM

## 2023-02-09 DIAGNOSIS — R262 Difficulty in walking, not elsewhere classified: Secondary | ICD-10-CM | POA: Diagnosis not present

## 2023-02-09 DIAGNOSIS — G8929 Other chronic pain: Secondary | ICD-10-CM | POA: Diagnosis not present

## 2023-02-09 DIAGNOSIS — M545 Low back pain, unspecified: Secondary | ICD-10-CM | POA: Diagnosis not present

## 2023-02-09 DIAGNOSIS — R278 Other lack of coordination: Secondary | ICD-10-CM | POA: Diagnosis not present

## 2023-02-09 DIAGNOSIS — R2689 Other abnormalities of gait and mobility: Secondary | ICD-10-CM | POA: Diagnosis not present

## 2023-02-09 DIAGNOSIS — M6281 Muscle weakness (generalized): Secondary | ICD-10-CM

## 2023-02-09 DIAGNOSIS — R269 Unspecified abnormalities of gait and mobility: Secondary | ICD-10-CM | POA: Diagnosis not present

## 2023-02-09 NOTE — Therapy (Unsigned)
OUTPATIENT PHYSICAL THERAPY NEURO TREATMENT   Patient Name: [name] MRN: [id number] DOB:November 29, 1945, 77 y.o., male Today's Date: 02/09/2023   PCP: Ardyth Man, PA- C REFERRING PROVIDER: Morene Crocker, MD  END OF SESSION:  PT End of Session - 02/09/23 1055     Visit Number 42    Number of Visits 63    Date for PT Re-Evaluation 04/25/23    Authorization Type [employer]    Progress Note Due on Visit 50    PT Start Time 1100    PT Stop Time 1144    PT Time Calculation (min) 44 min    Equipment Utilized During Treatment Gait belt    Activity Tolerance Patient limited by pain    Behavior During Therapy [facility] for tasks assessed/performed                              Past Medical History:  Diagnosis Date   Anxiety    Arteriosclerosis of coronary artery 11/16/2011   Overview:  Stent 10/2011 stent rca 2015 with collaterals to lad which is chronically occluded    Benign enlargement of prostate    Benign essential HTN 06/11/2014   Benign prostatic hypertrophy without urinary obstruction 07/31/2014   Bilateral cataracts 05/16/2013   Overview:  [facility]     Bone spur of foot    Left   BP (high blood pressure) 11/09/2012   Cancer (HCC)    skin (forehead) and bladder   Carotid artery narrowing 02/08/2014   Depression    Detrusor hypertrophy    Diabetes (HCC)    Diabetes mellitus, type 2 (HCC) 12/12/2012   Diverticulosis    Dyspnea    Esophageal reflux    Esophageal reflux    Fothergill's neuralgia 08/07/2012   Overview:  [facility]    Gastritis    GERD (gastroesophageal reflux disease)    Headache    cluster headaches   Healed myocardial infarct 11/09/2012   Hearing loss in left ear    Heart disease    Hematuria    Hemorrhoids    History of hiatal hernia 12/14/2017   small    Hypercholesteremia    Lesion of bladder    Myocardial infarct (HCC)    Presence of stent in coronary artery 11/09/2012   Rectal bleeding     Trigeminal neuralgia    Trigeminal neuralgia    Valvular heart disease    Vitamin D deficiency    Past Surgical History:  Procedure Laterality Date   APPENDECTOMY     BOTOX INJECTION N/A 12/21/2017   Procedure: Bladder BOTOX INJECTION;  Surgeon: Vanna Scotland, MD;  Location: [facility] ORS;  Service: Urology;  Laterality: N/A;   BOTOX INJECTION N/A 09/11/2018   Procedure: Bladder BOTOX INJECTION;  Surgeon: Vanna Scotland, MD;  Location: [facility] ORS;  Service: Urology;  Laterality: N/A;   CARDIAC CATHETERIZATION     CARDIAC CATHETERIZATION N/A 01/22/2015   Procedure: Left Heart Cath;  Surgeon: Lamar Blinks, MD;  Location: [facility] INVASIVE CV LAB;  Service: Cardiovascular;  Laterality: N/A;   CARDIAC CATHETERIZATION N/A 01/22/2015   Procedure: Coronary Stent Intervention;  Surgeon: Marcina Millard, MD;  Location: [facility] INVASIVE CV LAB;  Service: Cardiovascular;  Laterality: N/A;   CATARACT EXTRACTION, BILATERAL     COLONOSCOPY WITH PROPOFOL N/A 12/08/2015   Procedure: COLONOSCOPY WITH PROPOFOL;  Surgeon: Christena Deem, MD;  Location: [facility] ENDOSCOPY;  Service: Endoscopy;  Laterality:  N/A;   COLONOSCOPY WITH PROPOFOL N/A 12/09/2015   Procedure: COLONOSCOPY WITH PROPOFOL;  Surgeon: Christena Deem, MD;  Location[facility] ENDOSCOPY;  Service: Endoscopy;  Laterality: N/A;   CORONARY ANGIOPLASTY     5 stents   CORONARY STENT PLACEMENT  2015   x5   CYSTOSCOPY N/A 09/11/2018   Procedure: CYSTOSCOPY;  Surgeon: Vanna Scotland, MD;  Location: [facility] ORS;  Service: Urology;  Laterality: N/A;   CYSTOSCOPY WITH BIOPSY N/A 12/21/2017   Procedure: CYSTOSCOPY WITH BIOPSY;  Surgeon: Vanna Scotland, MD;  Location: [facility] ORS;  Service: Urology;  Laterality: N/A;   ESOPHAGOGASTRODUODENOSCOPY (EGD) WITH PROPOFOL N/A 12/08/2015   Procedure: ESOPHAGOGASTRODUODENOSCOPY (EGD) WITH PROPOFOL;  Surgeon: Christena Deem, MD;  Location: [facility] ENDOSCOPY;  Service: Endoscopy;  Laterality: N/A;   ESOPHAGOGASTRODUODENOSCOPY  (EGD) WITH PROPOFOL N/A 12/13/2017   Procedure: ESOPHAGOGASTRODUODENOSCOPY (EGD) WITH PROPOFOL;  Surgeon: Christena Deem, MD;  Location: [facility] ENDOSCOPY;  Service: Endoscopy;  Laterality: N/A;   EYE SURGERY     HERNIA REPAIR     kidney tumor remove     TRANSURETHRAL RESECTION OF BLADDER TUMOR WITH GYRUS (TURBT-GYRUS)  12/2013   UMBILICAL HERNIA REPAIR     urethral meatotomy     Patient Active Problem List   Diagnosis Date Noted   Class 2 obesity due to excess calories with body mass index (BMI) of 36.0 to 36.9 in adult 04/11/2020   Swelling of limb 03/28/2020   Lymphedema 03/28/2020   Trigeminal neuralgia 12/25/2019   Lower limb ulcer, calf, left, limited to breakdown of skin (HCC) 12/25/2019   OSA (obstructive sleep apnea) 01/23/2019   Acute systolic CHF (congestive heart failure) (HCC) 12/12/2018   Bruising 07/10/2018   Diet-controlled type 2 diabetes mellitus (HCC) 03/24/2018   Microalbuminuria 03/24/2018   Dizziness 11/17/2017   Simple chronic bronchitis (HCC) 09/22/2017   SOBOE (shortness of breath on exertion) 02/18/2017   Chronic midline low back pain without sciatica 12/29/2016   Periodic limb movement disorder 12/29/2016   Benign essential tremor 06/22/2016   Pure hypercholesterolemia 06/20/2015   Erectile dysfunction due to arterial insufficiency 06/16/2015   Unstable angina (HCC) 01/22/2015   Abdominal aortic 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 rupture (HCC) 12/31/2014   Benign prostatic hyperplasia without urinary obstruction 07/31/2014   Enlarged prostate 07/31/2014   Benign essential HTN 06/11/2014   Carotid artery narrowing 02/08/2014   Carotid artery obstruction 02/08/2014   Bilateral carotid artery stenosis 02/08/2014   Chest pain 08/20/2013   Bilateral cataracts 05/16/2013   Cataract 05/16/2013   Clinical depression 12/12/2012   Diabetes mellitus, type 2 (HCC) 12/12/2012   Combined fat and carbohydrate induced hyperlipemia 12/12/2012   Major depressive disorder,  single episode, unspecified 12/12/2012   Acid reflux 11/09/2012   Presence of stent in coronary artery 11/09/2012   BP (high blood pressure) 11/09/2012   Healed myocardial infarct 11/09/2012   Gastro-esophageal reflux disease without esophagitis 11/09/2012   History of cardiovascular surgery 11/09/2012   Fothergill's neuralgia 08/07/2012   Swelling of testicle 04/06/2012   Disorder of male genital organ 04/06/2012   Swelling of the testicles 04/06/2012   Arteriosclerosis of coronary artery 11/16/2011   CAD in native artery 11/16/2011   Fatigue 06/04/2011   Avitaminosis D 11/30/2010    ONSET DATE: 03/17/2022- Worsening symptoms  REFERRING DIAG:  G20.A1 (ICD-10-CM) - Parkinsons  R26.2 (ICD-10-CM) - Difficulty walking    THERAPY DIAG:  Muscle weakness (generalized)  Difficulty in walking, not elsewhere classified  Other abnormalities of gait and mobility  Abnormality  of gait and mobility  Unsteadiness on feet  Other lack of coordination  Rationale for Evaluation and Treatment: Rehabilitation  SUBJECTIVE:                                                                                                                                                                                             SUBJECTIVE STATEMENT:  Pt reported feeling slight pain in chest since last night, but otherwise feeling well.  He took a nitroglycerine pill to be safe but thinks it is muscular in nature/ soreness. BP assessed and WNL as documented in this note.   Pt accompanied by: significant other  PERTINENT HISTORY: Patient is a 77 year old male with referral to outpatient PT for Parkinsons and difficulty walking. He was recently seen by Neurology worsening symptoms of parkinson- difficulty with walking. He has past medical history of Arthritis, Bladder cancer, and Trigeminal Neuralgia.  PAIN:  Are you having pain? NO  PRECAUTIONS: Fall  WEIGHT BEARING RESTRICTIONS: No  FALLS: Has patient  fallen in last 6 months? Yes. Number of falls 1  LIVING ENVIRONMENT: Lives with: lives with their spouse Lives in: House/apartment Stairs: Yes: External: 2 steps; has grab bar Has following equipment at home: Dan Humphreys - 2 wheeled, Environmental consultant - 4 wheeled, and Grab bars  PLOF: Independent with household mobility with device, Independent with community mobility with device, Independent with transfers, and Requires assistive device for independence  PATIENT GOALS: Get to where I can walk better again and get my legs stronger.   OBJECTIVE:    TODAY'S TREATMENT:                                                                                                                              DATE: 02/09/23    Due to pt reporting slight chest pain at the beginning of the session, BP was taken:  1st reading: 145/75 in R arm sitting 2nd reading: 125/70 in L arm sitting  NMR:   Combo activity- Sit to stand then large step forward walk - then retro walk back with  stand to sit x 10 reps (at best- 7 steps counted forward and 7 retro)  Walking in // bars- lunge step forward + UE raises - 2 laps  Forward/retro step up/over orange hurdle x 20 reps with 4# AW alt LE (VC's to pick up feet as uch as possible to prevent knocking hurdle down)  TE  Sit to stand without UE support 2 sets x 10 reps   Resistive gait with 4# AW 2x300 feet using upright 4WW, CGA - VC for larger step length. (More shuffling with distracted or communicating)  LAQ 4# AW 2x10 each LE     PATIENT EDUCATION: Education details: Exercise technique Person educated: Patient Education method: Explanation, Demonstration, Tactile cues, Verbal cues, and Handouts Education comprehension: verbalized understanding, returned demonstration, verbal cues required, tactile cues required, and needs further education  HOME EXERCISE PROGRAM: Access Code: [email or web address] URL: [email or web address] Date: 08/11/2022 Prepared by: Maureen Ralphs  Exercises - Seated Lumbar Flexion Stretch  - 7 x weekly - 3 sets - 30 sec hold - Seated Thoracic Flexion and Rotation with Arms Crossed  - 1 x daily - 7 x weekly - 3 sets - 30 sec hold - Seated Figure 4 Piriformis Stretch  - 1 x daily - 7 x weekly - 3 sets - 10 reps  GOALS: Goals reviewed with patient? Yes  SHORT TERM GOALS: Target date: 09/20/2022  Pt will be independent with a comprehensive HEP in order to improve strength and balance in order to decrease fall risk and improve function at home and work.  Baseline: EVAL: Patient admits not performing much exercises - not moving around well. 10/25/2022-Patient reports not as compliant with previous HEP as instructed- Verbally reviewed Low back, cervical, thoracic stretching and importance of daily walking. 11/03/2022= Patient reports independent with cervical Stretches and some walking at home Goal status: MET   LONG TERM GOALS: Target date: 04/25/2023  Pt will improve FOTO to target score of 45 % to display perceived improvements in ability to complete ADL's.  Baseline: EVAL: 42: 11/03/2022= 55 Goal status: MET  2.  Pt will decrease 5TSTS by at least 5 seconds in order to demonstrate clinically significant improvement in LE strength.  Baseline: EVAL = 17.39 sec without UE support; 10/25/2022= 12.91 sec 12/2: 12.02 sec Goal status: MET  3. Pt will increase by at least 0.15 m/s in order to demonstrate clinically significant improvement in community ambulation   Baseline: EVAL= 0.67 m/s; 10/25/2022= 0.77 m/s 12/2: 0.84 m/s with 4WW Goal status: MET  4.  Pt will improve BERG by at least 3 points in order to demonstrate clinically significant improvement in balance.   Baseline: EVAL: to be assessed next visit: 08/23/2022= 36/56; 11/03/2022= 40/56 Goal status: MET  5.  Pt will decrease TUG to below 14 seconds/decrease in order to demonstrate decreased fall risk. Baseline: EVAL: 22.00 sec with upright 4WW; 10/25/2022= 15.98 sec  with 4WW 12/2: 16.40 sec   Goal status: PROGRESSING  6.  Pt will increase by at least 39m (11ft) in order to demonstrate clinically significant improvement in cardiopulmonary endurance and community ambulation  Baseline: EVAL: 3:45 min (450 feet with upright 4WW) 10/25/2022= 830 feet with 4WW Goal status: MET  7.   Patient will report returning to walking in to community places using most appropriate assistive device vs current use of scooter for improved abilities with mobility during community outings Baseline: EVAL: Current using scooter for many social/community outings; 10/25/2022- Paitent continues to scooter for long distance  community distances- due to some fatigue or SOB.  12/2: Only uses scooter when he has to ge tin the yard, does not use AD in home or garage majority of the time Goal status: PROGRESSING  8.  Pt will increase > 1000 feet in order to demonstrate clinically significant improvement in cardiopulmonary endurance and community ambulation   Baseline: 10/25/2022= 830 feet with 4WW 12/2: 919 feet with 4WW  Goal status= PROGRESSING  9.  Pt will improve BERG by at least 3 points in order to demonstrate clinically significant improvement in balance.   Baseline: 11/03/2022= 40/56 12/2: 44/56 Goal status: MET    10. Pt will transition into gym-based exercise routine upon discharge in order to maintain progress of strength and balance  Baseline: 01/31/2023: Future interventions will involve therex in [facility] to prescribe pt proper exercise routine upon discharge  Goal Status: NEW    ASSESSMENT:  CLINICAL IMPRESSION: Pt tolerated all tasks during today's visit. Towards the end of today's visit, pt reported slight chest pain ceasing and BP assessed form earlier was WNL. Pt still demonstrated moderate difficulty clearing orange hurdle, but efficiency of task increased as reps progressed. Pt reported fatigue after today's session, but felt accomplished. Pt will continue  to benefit from skilled PT services to address deficits and impairment identified in evaluation in order to maximize independence and safety in basic mobility required for performance of ADL, IADL, and leisure.    OBJECTIVE IMPAIRMENTS: Abnormal gait, cardiopulmonary status limiting activity, decreased activity tolerance, decreased balance, decreased coordination, decreased endurance, decreased mobility, difficulty walking, decreased ROM, decreased strength, hypomobility, impaired flexibility, impaired sensation, impaired UE functional use, postural dysfunction, obesity, and pain.   ACTIVITY LIMITATIONS: carrying, lifting, bending, standing, squatting, stairs, transfers, continence, and reach over head  PARTICIPATION LIMITATIONS: meal prep, cleaning, laundry, driving, shopping, community activity, and yard work  PERSONAL FACTORS: Age and 3+ comorbidities: HTN, Arthritis, Low back pain, Trigeminal Neuralgia  are also affecting patient's functional outcome.   REHAB POTENTIAL: Good  CLINICAL DECISION MAKING: Evolving/moderate complexity  EVALUATION COMPLEXITY: Moderate  PLAN:  PT FREQUENCY: 1-2x/week  PT DURATION: 12 weeks  PLANNED INTERVENTIONS: Therapeutic exercises, Therapeutic activity, Neuromuscular re-education, Balance training, Gait training, Patient/Family education, Self Care, Joint mobilization, Stair training, Vestibular training, Canalith repositioning, DME instructions, Dry Needling, Electrical stimulation, Spinal manipulation, Spinal mobilization, Cryotherapy, Moist heat, Manual therapy, and Re-evaluation  PLAN FOR NEXT SESSION: Progress therex and balance training. Continue with  training in [facility] for UE/LE strengthening and cardiovascular training.    Debara Pickett, SPT This entire session was performed under direct supervision and direction of a licensed Estate agent . I have personally read, edited and approve of the note as written.   Norman Herrlich PT ,DPT Physical Therapist- [place]  [facility]   11:50 AM, 02/09/23

## 2023-02-14 ENCOUNTER — Emergency Department
Admission: EM | Admit: 2023-02-14 | Discharge: 2023-02-14 | Disposition: A | Discharge status: Home / Self Care | Payer: Medicare HMO | Attending: Emergency Medicine | Admitting: Emergency Medicine

## 2023-02-14 ENCOUNTER — Other Ambulatory Visit: Payer: Self-pay

## 2023-02-14 ENCOUNTER — Emergency Department: Payer: Medicare HMO

## 2023-02-14 ENCOUNTER — Ambulatory Visit: Payer: Medicare HMO

## 2023-02-14 DIAGNOSIS — J9811 Atelectasis: Secondary | ICD-10-CM | POA: Diagnosis not present

## 2023-02-14 DIAGNOSIS — R0789 Other chest pain: Secondary | ICD-10-CM | POA: Insufficient documentation

## 2023-02-14 DIAGNOSIS — I2510 Atherosclerotic heart disease of native coronary artery without angina pectoris: Secondary | ICD-10-CM | POA: Diagnosis not present

## 2023-02-14 DIAGNOSIS — R079 Chest pain, unspecified: Secondary | ICD-10-CM

## 2023-02-14 DIAGNOSIS — J432 Centrilobular emphysema: Secondary | ICD-10-CM | POA: Diagnosis not present

## 2023-02-14 DIAGNOSIS — R0602 Shortness of breath: Secondary | ICD-10-CM | POA: Insufficient documentation

## 2023-02-14 DIAGNOSIS — R918 Other nonspecific abnormal finding of lung field: Secondary | ICD-10-CM | POA: Diagnosis not present

## 2023-02-14 LAB — BASIC METABOLIC PANEL
Anion gap: 9 (ref 5–15)
BUN: 22 mg/dL (ref 8–23)
CO2: 26 mmol/L (ref 22–32)
Calcium: 9.1 mg/dL (ref 8.9–10.3)
Chloride: 103 mmol/L (ref 98–111)
Creatinine, Ser: 0.95 mg/dL (ref 0.61–1.24)
GFR, Estimated: >60 mL/min (ref >60)
Glucose, Bld: 126 mg/dL — ABNORMAL HIGH (ref 70–99)
Potassium: 4 mmol/L (ref 3.5–5.1)
Sodium: 138 mmol/L (ref 135–145)

## 2023-02-14 LAB — CBC
HCT: 44.9 % (ref 39.0–52.0)
Hemoglobin: 15.2 g/dL (ref 13.0–17.0)
MCH: 31.3 pg (ref 26.0–34.0)
MCHC: 33.9 g/dL (ref 30.0–36.0)
MCV: 92.6 fL (ref 80.0–100.0)
Platelets: 160 10*3/uL (ref 150–400)
RBC: 4.85 MIL/uL (ref 4.22–5.81)
RDW: 12.6 % (ref 11.5–15.5)
WBC: 7.2 10*3/uL (ref 4.0–10.5)
nRBC: 0 % (ref 0.0–0.2)

## 2023-02-14 LAB — TROPONIN I (HIGH SENSITIVITY)
Troponin I (High Sensitivity): 7 ng/L (ref <18)
Troponin I (High Sensitivity): 7 ng/L (ref <18)

## 2023-02-14 MED ORDER — IOHEXOL 350 MG/ML SOLN
75.0000 mL | Freq: Once | INTRAVENOUS | Status: AC | PRN
  Administered 2023-02-14: 75 mL via INTRAVENOUS

## 2023-02-14 NOTE — ED Triage Notes (Signed)
Pt c/o L sided chest pain and SOB x1 week.  Pain score 5/10.  Pt reports "this is how my last heart attack started."  Pt sts he is having difficulty taking deep breaths. Pt reports he has taken a nitroglycerin per day for the last week.   Hx of 2 heart attacks.

## 2023-02-14 NOTE — ED Notes (Addendum)
See triage note  Presents with a 5 day hx of of chest discomfort and SOB  States this is how he felt with his last "heart attack" Afebrile  States pain is worse with inspriation

## 2023-02-14 NOTE — ED Provider Notes (Signed)
-----------------------------------------   2:58 PM on 02/14/2023 -----------------------------------------  Blood pressure 130/70, pulse 70, temperature 98 F (36.7 C), temperature source Oral, resp. rate 18, height 5\' 7"  (1.702 m), weight 101.2 kg, SpO2 95%.  Assuming care from Dr. Derrill Kay.  In short, [name] is a 77 y.o. male with a chief complaint of Chest Pain and Shortness of Breath .  Refer to the original H&P for additional details.  The current plan of care is to follow-up CTA results.  ----------------------------------------- 3:45 PM PM on 02/14/2023 ----------------------------------------- CTA chest is negative for PE, given reassuring workup, patient appropriate for discharge home with outpatient follow-up.  He was counseled to return to the ED for new or worsening symptoms.  Patient and spouse agree with plan.    [name], MD 02/14/23 2325

## 2023-02-14 NOTE — ED Provider Notes (Signed)
[facility] Provider Note    Event Date/Time   First MD Initiated Contact with Patient 02/14/23 [phone]     (approximate)   History   Chest pain   HPI  [name] is a 77 y.o. male  who presents to the emergency department today because of concern for chest pain. Located in his left central chest it has been intermittent for the past week. He notices it when he takes deep breaths. He denies any shortness of breath to myself. He says that the pain reminded him of when he had a heart attack in the past. It does not radiate. He tried taking a nitroglycerin which did not give any relief. At the time of my exam he says he is feeling better.      Physical Exam   Triage Vital Signs: ED Triage Vitals [02/14/23 0812]  Encounter Vitals Group     BP 134/74     Systolic BP Percentile      Diastolic BP Percentile      Pulse Rate 68     Resp 18     Temp (!) 97.3 F (36.3 C)     Temp Source Oral     SpO2 95 %     Weight 223 lb (101.2 kg)     Height 5\' 7"  (1.702 m)     Head Circumference      Peak Flow      Pain Score 5     Pain Loc      Pain Education      Exclude from Growth Chart     Most recent vital signs: Vitals:   02/14/23 0812  BP: 134/74  Pulse: 68  Resp: 18  Temp: (!) 97.3 F (36.3 C)  SpO2: 95%   General: Awake, alert, oriented. CV:  Good peripheral perfusion. Regular rate and rhythm. Resp:  Normal effort. Lungs clear. Abd:  No distention. Non tender.    ED Results / Procedures / Treatments   Labs (all labs ordered are listed, but only abnormal results are displayed) Labs Reviewed  BASIC METABOLIC PANEL - Abnormal; Notable for the following components:      Result Value   Glucose, Bld 126 (*)    All other components within normal limits  CBC  TROPONIN I (HIGH SENSITIVITY)  TROPONIN I (HIGH SENSITIVITY)     EKG  I, Phineas Semen, attending physician, personally viewed and interpreted this EKG  EKG Time: 0815 Rate:  68 Rhythm: normal sinus rhythm Axis: left axis deviation Intervals: qtc 472 QRS: RBBB, LAFB ST changes: no st elevation Impression: abnormal ekg   RADIOLOGY I independently interpreted and visualized the CXR. My interpretation: No pneumonia Radiology interpretation:  IMPRESSION:  Mild peribronchial thickening is increased compared to the prior  exam and may represent chronic bronchitis. No focal consolidation.      PROCEDURES:  Critical Care performed: No    MEDICATIONS ORDERED IN ED: Medications - No data to display   IMPRESSION / MDM / ASSESSMENT AND PLAN / ED COURSE  I reviewed the triage vital signs and the nursing notes.                              Differential diagnosis includes, but is not limited to, ACS, pneumonia, PE, PTX  Patient's presentation is most consistent with acute presentation with potential threat to life or bodily function.  Patient presented to the emergency department  today because of concern for chest pain. EKG without ST elevation. Initial troponin negative. CXR without obvious pneumonia. Given description of pain being worse with inspiration will obtain CTAPE to evaluate for PE. Given history of CAD will check second troponin although I have low suspicion for ACS at this time given initial negative troponin and length of symptoms.  Second troponin negative. CT angio pending at time of sign out      FINAL CLINICAL IMPRESSION(S) / ED DIAGNOSES   Final diagnoses:  Nonspecific chest pain     Note:  This document was prepared using Dragon voice recognition software and may include unintentional dictation errors.    Phineas Semen, MD 02/15/23 [phone]

## 2023-02-16 ENCOUNTER — Ambulatory Visit: Payer: Medicare HMO

## 2023-02-21 NOTE — Therapy (Signed)
OUTPATIENT PHYSICAL THERAPY NEURO TREATMENT   Patient Name: [name] MRN: [id number] DOB:Feb 24, 1946, 77 y.o., male Today's Date: 02/22/2023   PCP: Ardyth Man, PA- C REFERRING PROVIDER: Morene Crocker, MD  END OF SESSION:  PT End of Session - 02/22/23 0813     Visit Number 43    Number of Visits 63    Date for PT Re-Evaluation 04/25/23    Authorization Type [employer]    Progress Note Due on Visit 50    PT Start Time 0801    PT Stop Time 0845    PT Time Calculation (min) 44 min    Equipment Utilized During Treatment Gait belt    Activity Tolerance Patient limited by pain    Behavior During Therapy [facility] for tasks assessed/performed                               Past Medical History:  Diagnosis Date   Anxiety    Arteriosclerosis of coronary artery 11/16/2011   Overview:  Stent 10/2011 stent rca 2015 with collaterals to lad which is chronically occluded    Benign enlargement of prostate    Benign essential HTN 06/11/2014   Benign prostatic hypertrophy without urinary obstruction 07/31/2014   Bilateral cataracts 05/16/2013   Overview:  [facility]     Bone spur of foot    Left   BP (high blood pressure) 11/09/2012   Cancer (HCC)    skin (forehead) and bladder   Carotid artery narrowing 02/08/2014   Depression    Detrusor hypertrophy    Diabetes (HCC)    Diabetes mellitus, type 2 (HCC) 12/12/2012   Diverticulosis    Dyspnea    Esophageal reflux    Esophageal reflux    Fothergill's neuralgia 08/07/2012   Overview:  [facility]    Gastritis    GERD (gastroesophageal reflux disease)    Headache    cluster headaches   Healed myocardial infarct 11/09/2012   Hearing loss in left ear    Heart disease    Hematuria    Hemorrhoids    History of hiatal hernia 12/14/2017   small    Hypercholesteremia    Lesion of bladder    Myocardial infarct (HCC)    Presence of stent in coronary artery 11/09/2012   Rectal bleeding     Trigeminal neuralgia    Trigeminal neuralgia    Valvular heart disease    Vitamin D deficiency    Past Surgical History:  Procedure Laterality Date   APPENDECTOMY     BOTOX INJECTION N/A 12/21/2017   Procedure: Bladder BOTOX INJECTION;  Surgeon: Vanna Scotland, MD;  Location: [facility] ORS;  Service: Urology;  Laterality: N/A;   BOTOX INJECTION N/A 09/11/2018   Procedure: Bladder BOTOX INJECTION;  Surgeon: Vanna Scotland, MD;  Location: [facility] ORS;  Service: Urology;  Laterality: N/A;   CARDIAC CATHETERIZATION     CARDIAC CATHETERIZATION N/A 01/22/2015   Procedure: Left Heart Cath;  Surgeon: Lamar Blinks, MD;  Location: [facility] INVASIVE CV LAB;  Service: Cardiovascular;  Laterality: N/A;   CARDIAC CATHETERIZATION N/A 01/22/2015   Procedure: Coronary Stent Intervention;  Surgeon: Marcina Millard, MD;  Location: [facility] INVASIVE CV LAB;  Service: Cardiovascular;  Laterality: N/A;   CATARACT EXTRACTION, BILATERAL     COLONOSCOPY WITH PROPOFOL N/A 12/08/2015   Procedure: COLONOSCOPY WITH PROPOFOL;  Surgeon: Christena Deem, MD;  Location: [facility] ENDOSCOPY;  Service: Endoscopy;  Laterality: N/A;   COLONOSCOPY WITH PROPOFOL N/A 12/09/2015   Procedure: COLONOSCOPY WITH PROPOFOL;  Surgeon: Christena Deem, MD;  Location: [facility] ENDOSCOPY;  Service: Endoscopy;  Laterality: N/A;   CORONARY ANGIOPLASTY     5 stents   CORONARY STENT PLACEMENT  2015   x5   CYSTOSCOPY N/A 09/11/2018   Procedure: CYSTOSCOPY;  Surgeon: Vanna Scotland, MD;  Location: [facility] ORS;  Service: Urology;  Laterality: N/A;   CYSTOSCOPY WITH BIOPSY N/A 12/21/2017   Procedure: CYSTOSCOPY WITH BIOPSY;  Surgeon: Vanna Scotland, MD;  Location: [facility] ORS;  Service: Urology;  Laterality: N/A;   ESOPHAGOGASTRODUODENOSCOPY (EGD) WITH PROPOFOL N/A 12/08/2015   Procedure: ESOPHAGOGASTRODUODENOSCOPY (EGD) WITH PROPOFOL;  Surgeon: Christena Deem, MD;  Location: [facility] ENDOSCOPY;  Service: Endoscopy;  Laterality: N/A;   ESOPHAGOGASTRODUODENOSCOPY  (EGD) WITH PROPOFOL N/A 12/13/2017   Procedure: ESOPHAGOGASTRODUODENOSCOPY (EGD) WITH PROPOFOL;  Surgeon: Christena Deem, MD;  Location: [facility] ENDOSCOPY;  Service: Endoscopy;  Laterality: N/A;   EYE SURGERY     HERNIA REPAIR     kidney tumor remove     TRANSURETHRAL RESECTION OF BLADDER TUMOR WITH GYRUS (TURBT-GYRUS)  12/2013   UMBILICAL HERNIA REPAIR     urethral meatotomy     Patient Active Problem List   Diagnosis Date Noted   Class 2 obesity due to excess calories with body mass index (BMI) of 36.0 to 36.9 in adult 04/11/2020   Swelling of limb 03/28/2020   Lymphedema 03/28/2020   Trigeminal neuralgia 12/25/2019   Lower limb ulcer, calf, left, limited to breakdown of skin (HCC) 12/25/2019   OSA (obstructive sleep apnea) 01/23/2019   Acute systolic CHF (congestive heart failure) (HCC) 12/12/2018   Bruising 07/10/2018   Diet-controlled type 2 diabetes mellitus (HCC) 03/24/2018   Microalbuminuria 03/24/2018   Dizziness 11/17/2017   Simple chronic bronchitis (HCC) 09/22/2017   SOBOE (shortness of breath on exertion) 02/18/2017   Chronic midline low back pain without sciatica 12/29/2016   Periodic limb movement disorder 12/29/2016   Benign essential tremor 06/22/2016   Pure hypercholesterolemia 06/20/2015   Erectile dysfunction due to arterial insufficiency 06/16/2015   Unstable angina (HCC) 01/22/2015   Abdominal aortic 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 rupture (HCC) 12/31/2014   Benign prostatic hyperplasia without urinary obstruction 07/31/2014   Enlarged prostate 07/31/2014   Benign essential HTN 06/11/2014   Carotid artery narrowing 02/08/2014   Carotid artery obstruction 02/08/2014   Bilateral carotid artery stenosis 02/08/2014   Chest pain 08/20/2013   Bilateral cataracts 05/16/2013   Cataract 05/16/2013   Clinical depression 12/12/2012   Diabetes mellitus, type 2 (HCC) 12/12/2012   Combined fat and carbohydrate induced hyperlipemia 12/12/2012   Major depressive disorder,  single episode, unspecified 12/12/2012   Acid reflux 11/09/2012   Presence of stent in coronary artery 11/09/2012   BP (high blood pressure) 11/09/2012   Healed myocardial infarct 11/09/2012   Gastro-esophageal reflux disease without esophagitis 11/09/2012   History of cardiovascular surgery 11/09/2012   Fothergill's neuralgia 08/07/2012   Swelling of testicle 04/06/2012   Disorder of male genital organ 04/06/2012   Swelling of the testicles 04/06/2012   Arteriosclerosis of coronary artery 11/16/2011   CAD in native artery 11/16/2011   Fatigue 06/04/2011   Avitaminosis D 11/30/2010    ONSET DATE: 03/17/2022- Worsening symptoms  REFERRING DIAG:  G20.A1 (ICD-10-CM) - Parkinsons  R26.2 (ICD-10-CM) - Difficulty walking    THERAPY DIAG:  Muscle weakness (generalized)  Difficulty in walking, not elsewhere classified  Other abnormalities of gait and mobility  Abnormality of gait and mobility  Unsteadiness on feet  Other lack of coordination  Chronic bilateral low back pain without sciatica  Rationale for Evaluation and Treatment: Rehabilitation  SUBJECTIVE:                                                                                                                                                                                             SUBJECTIVE STATEMENT:  Patient reports feeling better overall with no further chest pain. Does endorse some right hip soreness.   Pt accompanied by: significant other  PERTINENT HISTORY: Patient is a 77 year old male with referral to outpatient PT for Parkinsons and difficulty walking. He was recently seen by Neurology worsening symptoms of parkinson- difficulty with walking. He has past medical history of Arthritis, Bladder cancer, and Trigeminal Neuralgia.  PAIN:  Are you having pain? NO  PRECAUTIONS: Fall  WEIGHT BEARING RESTRICTIONS: No  FALLS: Has patient fallen in last 6 months? Yes. Number of falls 1  LIVING  ENVIRONMENT: Lives with: lives with their spouse Lives in: House/apartment Stairs: Yes: External: 2 steps; has grab bar Has following equipment at home: Dan Humphreys - 2 wheeled, Environmental consultant - 4 wheeled, and Grab bars  PLOF: Independent with household mobility with device, Independent with community mobility with device, Independent with transfers, and Requires assistive device for independence  PATIENT GOALS: Get to where I can walk better again and get my legs stronger.   OBJECTIVE:    TODAY'S TREATMENT:                                                                                                                              DATE: 02/22/23    Due to pt reporting slight chest pain at the beginning of the session, BP was taken:  1st reading: 145/75 in R arm sitting 2nd reading: 125/70 in L arm sitting  NMR:   Static standing on airex beam- attempting to hold without UE support and hold as long as possible.  Large step forward walk - then retro walk back  (at best- 6  steps counted forward and 8 retro)  Dynamic hip march on airex pad with minimal UE support x 25 reps alt LE  Forward/retro step up/over orange hurdle x 20 reps with 4# AW alt LE (VC's to pick up feet as uch as possible to prevent knocking hurdle down)  Dynamic lateral step up/over orange hurdle  x 20 reps each  TE  Sit to stand without UE support 2 sets x 10 reps     Standing calf raises x 20 reps BLE   Standing hip ext x 15 reps alt LE  Standing ham curl x 20 reps alt LE    PATIENT EDUCATION: Education details: Exercise technique Person educated: Patient Education method: Explanation, Demonstration, Tactile cues, Verbal cues, and Handouts Education comprehension: verbalized understanding, returned demonstration, verbal cues required, tactile cues required, and needs further education  HOME EXERCISE PROGRAM: Access Code: [email or web address] URL: [email or web address] Date: 08/11/2022 Prepared by: Maureen Ralphs  Exercises - Seated Lumbar Flexion Stretch  - 7 x weekly - 3 sets - 30 sec hold - Seated Thoracic Flexion and Rotation with Arms Crossed  - 1 x daily - 7 x weekly - 3 sets - 30 sec hold - Seated Figure 4 Piriformis Stretch  - 1 x daily - 7 x weekly - 3 sets - 10 reps  GOALS: Goals reviewed with patient? Yes  SHORT TERM GOALS: Target date: 09/20/2022  Pt will be independent with a comprehensive HEP in order to improve strength and balance in order to decrease fall risk and improve function at home and work.  Baseline: EVAL: Patient admits not performing much exercises - not moving around well. 10/25/2022-Patient reports not as compliant with previous HEP as instructed- Verbally reviewed Low back, cervical, thoracic stretching and importance of daily walking. 11/03/2022= Patient reports independent with cervical Stretches and some walking at home Goal status: MET   LONG TERM GOALS: Target date: 04/25/2023  Pt will improve FOTO to target score of 45 % to display perceived improvements in ability to complete ADL's.  Baseline: EVAL: 42: 11/03/2022= 55 Goal status: MET  2.  Pt will decrease 5TSTS by at least 5 seconds in order to demonstrate clinically significant improvement in LE strength.  Baseline: EVAL = 17.39 sec without UE support; 10/25/2022= 12.91 sec 12/2: 12.02 sec Goal status: MET  3. Pt will increase by at least 0.15 m/s in order to demonstrate clinically significant improvement in community ambulation   Baseline: EVAL= 0.67 m/s; 10/25/2022= 0.77 m/s 12/2: 0.84 m/s with 4WW Goal status: MET  4.  Pt will improve BERG by at least 3 points in order to demonstrate clinically significant improvement in balance.   Baseline: EVAL: to be assessed next visit: 08/23/2022= 36/56; 11/03/2022= 40/56 Goal status: MET  5.  Pt will decrease TUG to below 14 seconds/decrease in order to demonstrate decreased fall risk. Baseline: EVAL: 22.00 sec with upright 4WW; 10/25/2022= 15.98 sec  with 4WW 12/2: 16.40 sec   Goal status: PROGRESSING  6.  Pt will increase by at least 59m (13ft) in order to demonstrate clinically significant improvement in cardiopulmonary endurance and community ambulation  Baseline: EVAL: 3:45 min (450 feet with upright 4WW) 10/25/2022= 830 feet with 4WW Goal status: MET  7.   Patient will report returning to walking in to community places using most appropriate assistive device vs current use of scooter for improved abilities with mobility during community outings Baseline: EVAL: Current using scooter for many social/community outings; 10/25/2022- Paitent continues to scooter for  long distance community distances- due to some fatigue or SOB.  12/2: Only uses scooter when he has to ge tin the yard, does not use AD in home or garage majority of the time Goal status: PROGRESSING  8.  Pt will increase > 1000 feet in order to demonstrate clinically significant improvement in cardiopulmonary endurance and community ambulation   Baseline: 10/25/2022= 830 feet with 4WW 12/2: 919 feet with 4WW  Goal status= PROGRESSING  9.  Pt will improve BERG by at least 3 points in order to demonstrate clinically significant improvement in balance.   Baseline: 11/03/2022= 40/56 12/2: 44/56 Goal status: MET    10. Pt will transition into gym-based exercise routine upon discharge in order to maintain progress of strength and balance  Baseline: 01/31/2023: Future interventions will involve therex in [facility] to prescribe pt proper exercise routine upon discharge  Goal Status: NEW    ASSESSMENT:  CLINICAL IMPRESSION: Patient returns feeling better overall. Treatment consisted of only bodyweight exercises and dynamic balance and patient without complaint of any chest pain today. He was able to improve step height with various balance activities with minimal VC today. He performed well with only short rest breaks and no complaint of any further hip pain. Much improved  ability to clear hurdle today.  Pt will continue to benefit from skilled PT services to address deficits and impairment identified in evaluation in order to maximize independence and safety in basic mobility required for performance of ADL, IADL, and leisure.    OBJECTIVE IMPAIRMENTS: Abnormal gait, cardiopulmonary status limiting activity, decreased activity tolerance, decreased balance, decreased coordination, decreased endurance, decreased mobility, difficulty walking, decreased ROM, decreased strength, hypomobility, impaired flexibility, impaired sensation, impaired UE functional use, postural dysfunction, obesity, and pain.   ACTIVITY LIMITATIONS: carrying, lifting, bending, standing, squatting, stairs, transfers, continence, and reach over head  PARTICIPATION LIMITATIONS: meal prep, cleaning, laundry, driving, shopping, community activity, and yard work  PERSONAL FACTORS: Age and 3+ comorbidities: HTN, Arthritis, Low back pain, Trigeminal Neuralgia  are also affecting patient's functional outcome.   REHAB POTENTIAL: Good  CLINICAL DECISION MAKING: Evolving/moderate complexity  EVALUATION COMPLEXITY: Moderate  PLAN:  PT FREQUENCY: 1-2x/week  PT DURATION: 12 weeks  PLANNED INTERVENTIONS: Therapeutic exercises, Therapeutic activity, Neuromuscular re-education, Balance training, Gait training, Patient/Family education, Self Care, Joint mobilization, Stair training, Vestibular training, Canalith repositioning, DME instructions, Dry Needling, Electrical stimulation, Spinal manipulation, Spinal mobilization, Cryotherapy, Moist heat, Manual therapy, and Re-evaluation  PLAN FOR NEXT SESSION: Progress therex and balance training. Continue with  training in [place] for UE/LE strengthening and cardiovascular training.    Lenda Kelp PT  Physical Therapist- [place]  [facility]   8:53 AM, 02/22/23

## 2023-02-22 ENCOUNTER — Ambulatory Visit: Payer: Medicare HMO

## 2023-02-22 DIAGNOSIS — M6281 Muscle weakness (generalized): Secondary | ICD-10-CM

## 2023-02-22 DIAGNOSIS — R269 Unspecified abnormalities of gait and mobility: Secondary | ICD-10-CM | POA: Diagnosis not present

## 2023-02-22 DIAGNOSIS — R262 Difficulty in walking, not elsewhere classified: Secondary | ICD-10-CM

## 2023-02-22 DIAGNOSIS — R2681 Unsteadiness on feet: Secondary | ICD-10-CM

## 2023-02-22 DIAGNOSIS — R278 Other lack of coordination: Secondary | ICD-10-CM

## 2023-02-22 DIAGNOSIS — M545 Low back pain, unspecified: Secondary | ICD-10-CM | POA: Diagnosis not present

## 2023-02-22 DIAGNOSIS — R2689 Other abnormalities of gait and mobility: Secondary | ICD-10-CM | POA: Diagnosis not present

## 2023-02-22 DIAGNOSIS — G8929 Other chronic pain: Secondary | ICD-10-CM | POA: Diagnosis not present

## 2023-02-28 ENCOUNTER — Ambulatory Visit: Payer: Medicare HMO | Admitting: Podiatry

## 2023-02-28 NOTE — Therapy (Signed)
 OUTPATIENT PHYSICAL THERAPY NEURO TREATMENT   Patient Name: SOHAM HOLLETT MRN: [id number] DOB:1945-09-01, 77 y.o., male Today's Date: 03/01/2023   PCP: Damien Ryder, PA- C REFERRING PROVIDER: Lane Arthea BRAVO, MD  END OF SESSION:  PT End of Session - 03/01/23 1142     Visit Number 44    Number of Visits 63    Date for PT Re-Evaluation 04/25/23    Authorization Type [employer]    Progress Note Due on Visit 50    PT Start Time 1144    PT Stop Time 1226    PT Time Calculation (min) 42 min    Equipment Utilized During Treatment Gait belt    Activity Tolerance Patient limited by pain    Behavior During Therapy WFL for tasks assessed/performed             Past Medical History:  Diagnosis Date   Anxiety    Arteriosclerosis of coronary artery 11/16/2011   Overview:  Stent 10/2011 stent rca 2015 with collaterals to lad which is chronically occluded    Benign enlargement of prostate    Benign essential HTN 06/11/2014   Benign prostatic hypertrophy without urinary obstruction 07/31/2014   Bilateral cataracts 05/16/2013   Overview:  [facility]     Bone spur of foot    Left   BP (high blood pressure) 11/09/2012   Cancer (HCC)    skin (forehead) and bladder   Carotid artery narrowing 02/08/2014   Depression    Detrusor hypertrophy    Diabetes (HCC)    Diabetes mellitus, type 2 (HCC) 12/12/2012   Diverticulosis    Dyspnea    Esophageal reflux    Esophageal reflux    Fothergill's neuralgia 08/07/2012   Overview:  [facility]    Gastritis    GERD (gastroesophageal reflux disease)    Headache    cluster headaches   Healed myocardial infarct 11/09/2012   Hearing loss in left ear    Heart disease    Hematuria    Hemorrhoids    History of hiatal hernia 12/14/2017   small    Hypercholesteremia    Lesion of bladder    Myocardial infarct (HCC)    Presence of stent in coronary artery 11/09/2012   Rectal bleeding    Trigeminal neuralgia    Trigeminal  neuralgia    Valvular heart disease    Vitamin D deficiency    Past Surgical History:  Procedure Laterality Date   APPENDECTOMY     BOTOX  INJECTION N/A 12/21/2017   Procedure: Bladder BOTOX  INJECTION;  Surgeon: Penne Knee, MD;  Location: [facility] ORS;  Service: Urology;  Laterality: N/A;   BOTOX  INJECTION N/A 09/11/2018   Procedure: Bladder BOTOX  INJECTION;  Surgeon: Penne Knee, MD;  Location: [facility];  Service: Urology;  Laterality: N/A;   CARDIAC CATHETERIZATION     CARDIAC CATHETERIZATION N/A 01/22/2015   Procedure: Left Heart Cath;  Surgeon: Wolm JINNY Rhyme, MD;  Location: [facility];  Service: Cardiovascular;  Laterality: N/A;   CARDIAC CATHETERIZATION N/A 01/22/2015   Procedure: Coronary Stent Intervention;  Surgeon: Marsa Dooms, MD;  Location: [facility] INV[facility] CV [facility];  Service: Cardiovascular;  Laterality: N/A;   CATARACT EXTRACTION, BILATERAL     COLONOSCOPY WITH PROPOFOL  N/A 12/08/2015   Procedure: COLONOSCOPY WITH PROPOFOL ;  Surgeon: Gladis RAYMOND Mariner, MD;  Location: [facility];  Service: Endoscopy;  Laterality: N/A;   COLONOSCOPY WITH PROPOFOL  N/A 12/09/2015   Procedure: COLONOSCOPY WITH PROPOFOL ;  Surgeon: Gladis  RAYMOND Mariner, MD;  Location: [facility] ENDOSCOPY;  Service: Endoscopy;  Laterality: N/A;   CORONARY ANGIOPLASTY     5 stents   CORONARY STENT PLACEMENT  2015   x5   CYSTOSCOPY N/A 09/11/2018   Procedure: CYSTOSCOPY;  Surgeon: Penne Knee, MD;  Location: [facility];  Service: Urology;  Laterality: N/A;   CYSTOSCOPY WITH BIOPSY N/A 12/21/2017   Procedure: CYSTOSCOPY WITH BIOPSY;  Surgeon: Penne Knee, MD;  Location: [facility];  Service: Urology;  Laterality: N/A;   ESOPHAGOGASTRODUODENOSCOPY (EGD) WITH PROPOFOL  N/A 12/08/2015   Procedure: ESOPHAGOGASTRODUODENOSCOPY (EGD) WITH PROPOFOL ;  Surgeon: Gladis RAYMOND Mariner, MD;  Location: [facility] ENDOSCOPY;  Service: Endoscopy;  Laterality: N/A;   ESOPHAGOGASTRODUODENOSCOPY (EGD) WITH PROPOFOL  N/A 12/13/2017    Procedure: ESOPHAGOGASTRODUODENOSCOPY (EGD) WITH PROPOFOL ;  Surgeon: Mariner Gladis RAYMOND, MD;  Location: [facility] ENDOSCOPY;  Service: Endoscopy;  Laterality: N/A;   EYE SURGERY     HERNIA REPAIR     kidney tumor remove     TRANSURETHRAL RESECTION OF BLADDER TUMOR WITH GYRUS (TURBT-GYRUS)  12/2013   UMBILICAL HERNIA REPAIR     urethral meatotomy     Patient Active Problem List   Diagnosis Date Noted   Class 2 obesity due to excess calories with body mass index (BMI) of 36.0 to 36.9 in adult 04/11/2020   Swelling of limb 03/28/2020   Lymphedema 03/28/2020   Trigeminal neuralgia 12/25/2019   Lower limb ulcer, calf, left, limited to breakdown of skin (HCC) 12/25/2019   OSA (obstructive sleep apnea) 01/23/2019   Acute systolic CHF (congestive heart failure) (HCC) 12/12/2018   Bruising 07/10/2018   Diet-controlled type 2 diabetes mellitus (HCC) 03/24/2018   Microalbuminuria 03/24/2018   Dizziness 11/17/2017   Simple chronic bronchitis (HCC) 09/22/2017   SOBOE (shortness of breath on exertion) 02/18/2017   Chronic midline low back pain without sciatica 12/29/2016   Periodic limb movement disorder 12/29/2016   Benign essential tremor 06/22/2016   Pure hypercholesterolemia 06/20/2015   Erectile dysfunction due to arterial insufficiency 06/16/2015   Unstable angina (HCC) 01/22/2015   Abdominal aortic 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 rupture (HCC) 12/31/2014   Benign prostatic hyperplasia without urinary obstruction 07/31/2014   Enlarged prostate 07/31/2014   Benign essential HTN 06/11/2014   Carotid artery narrowing 02/08/2014   Carotid artery obstruction 02/08/2014   Bilateral carotid artery stenosis 02/08/2014   Chest pain 08/20/2013   Bilateral cataracts 05/16/2013   Cataract 05/16/2013   Clinical depression 12/12/2012   Diabetes mellitus, type 2 (HCC) 12/12/2012   Combined fat and carbohydrate induced hyperlipemia 12/12/2012   Major depressive disorder, single episode, unspecified 12/12/2012    Acid reflux 11/09/2012   Presence of stent in coronary artery 11/09/2012   BP (high blood pressure) 11/09/2012   Healed myocardial infarct 11/09/2012   Gastro-esophageal reflux disease without esophagitis 11/09/2012   History of cardiovascular surgery 11/09/2012   Fothergill's neuralgia 08/07/2012   Swelling of testicle 04/06/2012   Disorder of male genital organ 04/06/2012   Swelling of the testicles 04/06/2012   Arteriosclerosis of coronary artery 11/16/2011   CAD in native artery 11/16/2011   Fatigue 06/04/2011   Avitaminosis D 11/30/2010    ONSET DATE: 03/17/2022- Worsening symptoms  REFERRING DIAG:  G20.A1 (ICD-10-CM) - Parkinsons  R26.2 (ICD-10-CM) - Difficulty walking    THERAPY DIAG:  Muscle weakness (generalized)  Difficulty in walking, not elsewhere classified  Other abnormalities of gait and mobility  Abnormality of gait and mobility  Unsteadiness on feet  Rationale for Evaluation and Treatment: Rehabilitation  SUBJECTIVE:  SUBJECTIVE STATEMENT:    Patient reports feeling better overall with no further chest pain. Slight soreness in his R hip (3-4/10). \  Pt accompanied by: significant other  PERTINENT HISTORY: Patient is a 77 year old male with referral to outpatient PT for Parkinsons and difficulty walking. He was recently seen by Neurology worsening symptoms of parkinson- difficulty with walking. He has past medical history of Arthritis, Bladder cancer, and Trigeminal Neuralgia.  PAIN:  Are you having pain? NO  PRECAUTIONS: Fall  WEIGHT BEARING RESTRICTIONS: No  FALLS: Has patient fallen in last 6 months? Yes. Number of falls 1  LIVING ENVIRONMENT: Lives with: lives with their spouse Lives in: House/apartment Stairs: Yes: External: 2 steps; has grab bar Has  following equipment at home: Vannie - 2 wheeled, Environmental Consultant - 4 wheeled, and Grab bars  PLOF: Independent with household mobility with device, Independent with community mobility with device, Independent with transfers, and Requires assistive device for independence  PATIENT GOALS: Get to where I can walk better again and get my legs stronger.   OBJECTIVE:    TODAY'S TREATMENT:                                                                                                                              DATE: 03/01/23     NMR:    Dynamic hip march on airex pad with minimal UE support x 25 reps alt LE  Forward/retro step up/over orange hurdle x 20 reps with 4# AW alt LE (VC's to pick up feet as uch as possible to prevent knocking hurdle down)  Dynamic lateral step up/over orange hurdle  x 20 reps each  TE  Sit to stand without UE support 2 sets x 10 reps - with 3 kg ball overhead press     Standing calf raises x 20 reps BLE - 3# AW   Standing hip abduction 2 x 10 each LE - 3# AW    Standing hip ext x 15 reps alt LE - 3# AW  Standing ham curl x 20 reps alt LE - 3# AW    PATIENT EDUCATION: Education details: Exercise technique Person educated: Patient Education method: Explanation, Demonstration, Tactile cues, Verbal cues, and Handouts Education comprehension: verbalized understanding, returned demonstration, verbal cues required, tactile cues required, and needs further education  HOME EXERCISE PROGRAM: Access Code: [email or web address] URL: [email or web address] Date: 08/11/2022 Prepared by: Reyes London  Exercises - Seated Lumbar Flexion Stretch  - 7 x weekly - 3 sets - 30 sec hold - Seated Thoracic Flexion and Rotation with Arms Crossed  - 1 x daily - 7 x weekly - 3 sets - 30 sec hold - Seated Figure 4 Piriformis Stretch  - 1 x daily - 7 x weekly - 3 sets - 10 reps  GOALS: Goals reviewed with patient? Yes  SHORT TERM GOALS: Target date: 09/20/2022  Pt will be  independent with a comprehensive HEP in order to improve strength and  balance in order to decrease fall risk and improve function at home and work.  Baseline: EVAL: Patient admits not performing much exercises - not moving around well. 10/25/2022-Patient reports not as compliant with previous HEP as instructed- Verbally reviewed Low back, cervical, thoracic stretching and importance of daily walking. 11/03/2022= Patient reports independent with cervical Stretches and some walking at home Goal status: MET   LONG TERM GOALS: Target date: 04/25/2023  Pt will improve FOTO to target score of 45 % to display perceived improvements in ability to complete ADL's.  Baseline: EVAL: 42: 11/03/2022= 55 Goal status: MET  2.  Pt will decrease 5TSTS by at least 5 seconds in order to demonstrate clinically significant improvement in LE strength.  Baseline: EVAL = 17.39 sec without UE support; 10/25/2022= 12.91 sec 12/2: 12.02 sec Goal status: MET  3. Pt will increase by at least 0.15 m/s in order to demonstrate clinically significant improvement in community ambulation   Baseline: EVAL= 0.67 m/s; 10/25/2022= 0.77 m/s 12/2: 0.84 m/s with 4WW Goal status: MET  4.  Pt will improve BERG by at least 3 points in order to demonstrate clinically significant improvement in balance.   Baseline: EVAL: to be assessed next visit: 08/23/2022= 36/56; 11/03/2022= 40/56 Goal status: MET  5.  Pt will decrease TUG to below 14 seconds/decrease in order to demonstrate decreased fall risk. Baseline: EVAL: 22.00 sec with upright 4WW; 10/25/2022= 15.98 sec with 4WW 12/2: 16.40 sec   Goal status: PROGRESSING  6.  Pt will increase by at least 16m (152ft) in order to demonstrate clinically significant improvement in cardiopulmonary endurance and community ambulation  Baseline: EVAL: 3:45 min (450 feet with upright 4WW) 10/25/2022= 830 feet with 4WW Goal status: MET  7.   Patient will report returning to walking in to community  places using most appropriate assistive device vs current use of scooter for improved abilities with mobility during community outings Baseline: EVAL: Current using scooter for many social/community outings; 10/25/2022- Paitent continues to scooter for long distance community distances- due to some fatigue or SOB.  12/2: Only uses scooter when he has to ge tin the yard, does not use AD in home or garage majority of the time Goal status: PROGRESSING  8.  Pt will increase > 1000 feet in order to demonstrate clinically significant improvement in cardiopulmonary endurance and community ambulation   Baseline: 10/25/2022= 830 feet with 4WW 12/2: 919 feet with 4WW  Goal status= PROGRESSING  9.  Pt will improve BERG by at least 3 points in order to demonstrate clinically significant improvement in balance.   Baseline: 11/03/2022= 40/56 12/2: 44/56 Goal status: MET    10. Pt will transition into gym-based exercise routine upon discharge in order to maintain progress of strength and balance  Baseline: 01/31/2023: Future interventions will involve therex in [facility] to prescribe pt proper exercise routine upon discharge  Goal Status: NEW    ASSESSMENT:  CLINICAL IMPRESSION:    Patient arrives to treatment session motivated to participate with wife present. Session focused on BLE strengthening with weight as well as balance. Tolerated treatment session well with no complaints of chest pain. This date had difficulty advancing R LE this date until use of UE support. Pt will continue to benefit from skilled PT services to address deficits and impairment identified in evaluation in order to maximize independence and safety in basic mobility required for performance of ADL, IADL, and leisure.    OBJECTIVE IMPAIRMENTS: Abnormal gait, cardiopulmonary status limiting  activity, decreased activity tolerance, decreased balance, decreased coordination, decreased endurance, decreased mobility, difficulty walking,  decreased ROM, decreased strength, hypomobility, impaired flexibility, impaired sensation, impaired UE functional use, postural dysfunction, obesity, and pain.   ACTIVITY LIMITATIONS: carrying, lifting, bending, standing, squatting, stairs, transfers, continence, and reach over head  PARTICIPATION LIMITATIONS: meal prep, cleaning, laundry, driving, shopping, community activity, and yard work  PERSONAL FACTORS: Age and 3+ comorbidities: HTN, Arthritis, Low back pain, Trigeminal Neuralgia  are also affecting patient's functional outcome.   REHAB POTENTIAL: Good  CLINICAL DECISION MAKING: Evolving/moderate complexity  EVALUATION COMPLEXITY: Moderate  PLAN:  PT FREQUENCY: 1-2x/week  PT DURATION: 12 weeks  PLANNED INTERVENTIONS: Therapeutic exercises, Therapeutic activity, Neuromuscular re-education, Balance training, Gait training, Patient/Family education, Self Care, Joint mobilization, Stair training, Vestibular training, Canalith repositioning, DME instructions, Dry Needling, Electrical stimulation, Spinal manipulation, Spinal mobilization, Cryotherapy, Moist heat, Manual therapy, and Re-evaluation  PLAN FOR NEXT SESSION: Progress therex and balance training. Continue with  training in [facility] for UE/LE strengthening and cardiovascular training.    Maryanne Finder, PT, DPT  Physical Therapist- [facility]   11:44 AM, 03/01/23

## 2023-03-01 ENCOUNTER — Ambulatory Visit: Payer: Medicare HMO

## 2023-03-01 DIAGNOSIS — M6281 Muscle weakness (generalized): Secondary | ICD-10-CM

## 2023-03-01 DIAGNOSIS — R269 Unspecified abnormalities of gait and mobility: Secondary | ICD-10-CM

## 2023-03-01 DIAGNOSIS — R2681 Unsteadiness on feet: Secondary | ICD-10-CM | POA: Diagnosis not present

## 2023-03-01 DIAGNOSIS — R2689 Other abnormalities of gait and mobility: Secondary | ICD-10-CM | POA: Diagnosis not present

## 2023-03-01 DIAGNOSIS — R278 Other lack of coordination: Secondary | ICD-10-CM | POA: Diagnosis not present

## 2023-03-01 DIAGNOSIS — G8929 Other chronic pain: Secondary | ICD-10-CM | POA: Diagnosis not present

## 2023-03-01 DIAGNOSIS — R262 Difficulty in walking, not elsewhere classified: Secondary | ICD-10-CM

## 2023-03-01 DIAGNOSIS — M545 Low back pain, unspecified: Secondary | ICD-10-CM | POA: Diagnosis not present

## 2023-03-07 ENCOUNTER — Ambulatory Visit: Payer: Medicare Other

## 2023-03-08 NOTE — Therapy (Signed)
 OUTPATIENT PHYSICAL THERAPY NEURO TREATMENT   Patient Name: [name] MRN: [id number] DOB:Jun 10, 1945, 78 y.o., male Today's Date: 03/09/2023   PCP: Damien Ryder, PA- C REFERRING PROVIDER: Lane Arthea BRAVO, MD  END OF SESSION:  PT End of Session - 03/09/23 1109     Visit Number 45    Number of Visits 63    Date for PT Re-Evaluation 04/25/23    Authorization Type [employer]    Progress Note Due on Visit 50    PT Start Time 1100    PT Stop Time 1147    PT Time Calculation (min) 47 min    Equipment Utilized During Treatment Gait belt    Activity Tolerance Patient limited by pain    Behavior During Therapy [facility] for tasks assessed/performed              Past Medical History:  Diagnosis Date   Anxiety    Arteriosclerosis of coronary artery 11/16/2011   Overview:  Stent 10/2011 stent rca 2015 with collaterals to lad which is chronically occluded    Benign enlargement of prostate    Benign essential HTN 06/11/2014   Benign prostatic hypertrophy without urinary obstruction 07/31/2014   Bilateral cataracts 05/16/2013   Overview:  [facility]     Bone spur of foot    Left   BP (high blood pressure) 11/09/2012   Cancer (HCC)    skin (forehead) and bladder   Carotid artery narrowing 02/08/2014   Depression    Detrusor hypertrophy    Diabetes (HCC)    Diabetes mellitus, type 2 (HCC) 12/12/2012   Diverticulosis    Dyspnea    Esophageal reflux    Esophageal reflux    Fothergill's neuralgia 08/07/2012   Overview:  [facility] Neurology    Gastritis    GERD (gastroesophageal reflux disease)    Headache    cluster headaches   Healed myocardial infarct 11/09/2012   Hearing loss in left ear    Heart disease    Hematuria    Hemorrhoids    History of hiatal hernia 12/14/2017   small    Hypercholesteremia    Lesion of bladder    Myocardial infarct (HCC)    Presence of stent in coronary artery 11/09/2012   Rectal bleeding    Trigeminal neuralgia    Trigeminal  neuralgia    Valvular heart disease    Vitamin D deficiency    Past Surgical History:  Procedure Laterality Date   APPENDECTOMY     BOTOX  INJECTION N/A 12/21/2017   Procedure: Bladder BOTOX  INJECTION;  Surgeon: Penne Knee, MD;  Location: [facility] ORS;  Service: Urology;  Laterality: N/A;   BOTOX  INJECTION N/A 09/11/2018   Procedure: Bladder BOTOX  INJECTION;  Surgeon: Penne Knee, MD;  Location: [facility] ORS;  Service: Urology;  Laterality: N/A;   CARDIAC CATHETERIZATION     CARDIAC CATHETERIZATION N/A 01/22/2015   Procedure: Left Heart Cath;  Surgeon: Wolm JINNY Rhyme, MD;  Location: [facility] INVASIVE CV LAB;  Service: Cardiovascular;  Laterality: N/A;   CARDIAC CATHETERIZATION N/A 01/22/2015   Procedure: Coronary Stent Intervention;  Surgeon: Marsa Dooms, MD;  Location: [facility] INVASIVE CV LAB;  Service: Cardiovascular;  Laterality: N/A;   CATARACT EXTRACTION, BILATERAL     COLONOSCOPY WITH PROPOFOL  N/A 12/08/2015   Procedure: COLONOSCOPY WITH PROPOFOL ;  Surgeon: Gladis RAYMOND Mariner, MD;  Location: [facility] ENDOSCOPY;  Service: Endoscopy;  Laterality: N/A;   COLONOSCOPY WITH PROPOFOL  N/A 12/09/2015   Procedure: COLONOSCOPY WITH PROPOFOL ;  Surgeon:  Gladis RAYMOND Mariner, MD;  Location: [facility] ENDOSCOPY;  Service: Endoscopy;  Laterality: N/A;   CORONARY ANGIOPLASTY     5 stents   CORONARY STENT PLACEMENT  2015   x5   CYSTOSCOPY N/A 09/11/2018   Procedure: CYSTOSCOPY;  Surgeon: Penne Knee, MD;  Location: [facility] ORS;  Service: Urology;  Laterality: N/A;   CYSTOSCOPY WITH BIOPSY N/A 12/21/2017   Procedure: CYSTOSCOPY WITH BIOPSY;  Surgeon: Penne Knee, MD;  Location: [facility] ORS;  Service: Urology;  Laterality: N/A;   ESOPHAGOGASTRODUODENOSCOPY (EGD) WITH PROPOFOL  N/A 12/08/2015   Procedure: ESOPHAGOGASTRODUODENOSCOPY (EGD) WITH PROPOFOL ;  Surgeon: Gladis RAYMOND Mariner, MD;  Location: [facility] ENDOSCOPY;  Service: Endoscopy;  Laterality: N/A;   ESOPHAGOGASTRODUODENOSCOPY (EGD) WITH PROPOFOL  N/A 12/13/2017    Procedure: ESOPHAGOGASTRODUODENOSCOPY (EGD) WITH PROPOFOL ;  Surgeon: Mariner Gladis RAYMOND, MD;  Location: [facility] ENDOSCOPY;  Service: Endoscopy;  Laterality: N/A;   EYE SURGERY     HERNIA REPAIR     kidney tumor remove     TRANSURETHRAL RESECTION OF BLADDER TUMOR WITH GYRUS (TURBT-GYRUS)  12/2013   UMBILICAL HERNIA REPAIR     urethral meatotomy     Patient Active Problem List   Diagnosis Date Noted   Class 2 obesity due to excess calories with body mass index (BMI) of 36.0 to 36.9 in adult 04/11/2020   Swelling of limb 03/28/2020   Lymphedema 03/28/2020   Trigeminal neuralgia 12/25/2019   Lower limb ulcer, calf, left, limited to breakdown of skin (HCC) 12/25/2019   OSA (obstructive sleep apnea) 01/23/2019   Acute systolic CHF (congestive heart failure) (HCC) 12/12/2018   Bruising 07/10/2018   Diet-controlled type 2 diabetes mellitus (HCC) 03/24/2018   Microalbuminuria 03/24/2018   Dizziness 11/17/2017   Simple chronic bronchitis (HCC) 09/22/2017   SOBOE (shortness of breath on exertion) 02/18/2017   Chronic midline low back pain without sciatica 12/29/2016   Periodic limb movement disorder 12/29/2016   Benign essential tremor 06/22/2016   Pure hypercholesterolemia 06/20/2015   Erectile dysfunction due to arterial insufficiency 06/16/2015   Unstable angina (HCC) 01/22/2015   Abdominal aortic 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 rupture (HCC) 12/31/2014   Benign prostatic hyperplasia without urinary obstruction 07/31/2014   Enlarged prostate 07/31/2014   Benign essential HTN 06/11/2014   Carotid artery narrowing 02/08/2014   Carotid artery obstruction 02/08/2014   Bilateral carotid artery stenosis 02/08/2014   Chest pain 08/20/2013   Bilateral cataracts 05/16/2013   Cataract 05/16/2013   Clinical depression 12/12/2012   Diabetes mellitus, type 2 (HCC) 12/12/2012   Combined fat and carbohydrate induced hyperlipemia 12/12/2012   Major depressive disorder, single episode, unspecified 12/12/2012    Acid reflux 11/09/2012   Presence of stent in coronary artery 11/09/2012   BP (high blood pressure) 11/09/2012   Healed myocardial infarct 11/09/2012   Gastro-esophageal reflux disease without esophagitis 11/09/2012   History of cardiovascular surgery 11/09/2012   Fothergill's neuralgia 08/07/2012   Swelling of testicle 04/06/2012   Disorder of male genital organ 04/06/2012   Swelling of the testicles 04/06/2012   Arteriosclerosis of coronary artery 11/16/2011   CAD in native artery 11/16/2011   Fatigue 06/04/2011   Avitaminosis D 11/30/2010    ONSET DATE: 03/17/2022- Worsening symptoms  REFERRING DIAG:  G20.A1 (ICD-10-CM) - Parkinsons  R26.2 (ICD-10-CM) - Difficulty walking    THERAPY DIAG:  Muscle weakness (generalized)  Difficulty in walking, not elsewhere classified  Other abnormalities of gait and mobility  Abnormality of gait and mobility  Unsteadiness on feet  Other lack of coordination  Rationale for  Evaluation and Treatment: Rehabilitation  SUBJECTIVE:                                                                                                                                                                                             SUBJECTIVE STATEMENT:   Patient reports having some increased Low back pain 5/10. States has appointment for another injection in low back coming up soon.   Pt accompanied by: significant other  PERTINENT HISTORY: Patient is a 78 year old male with referral to outpatient PT for Parkinsons and difficulty walking. He was recently seen by Neurology worsening symptoms of parkinson- difficulty with walking. He has past medical history of Arthritis, Bladder cancer, and Trigeminal Neuralgia.  PAIN:  Are you having pain? NO  PRECAUTIONS: Fall  WEIGHT BEARING RESTRICTIONS: No  FALLS: Has patient fallen in last 6 months? Yes. Number of falls 1  LIVING ENVIRONMENT: Lives with: lives with their spouse Lives in:  House/apartment Stairs: Yes: External: 2 steps; has grab bar Has following equipment at home: Vannie - 2 wheeled, Environmental Consultant - 4 wheeled, and Grab bars  PLOF: Independent with household mobility with device, Independent with community mobility with device, Independent with transfers, and Requires assistive device for independence  PATIENT GOALS: Get to where I can walk better again and get my legs stronger.   OBJECTIVE:    TODAY'S TREATMENT:                                                                                                                              DATE: 03/09/23     NMR:    Dynamic hip march on airex pad with minimal UE support 2 sets x 20 reps alt LE  Forward/retro walking at support bar- VC to take a large steps lateral step up onto airex pad and then off x 20 reps each  TE:   Resistive Gait- 4# and upright 4WW- 300 feet x 2 trials today (no VC to pick up feet on 1st trip yet patient exhibited increased shuffle during 2nd walk- more with fatigue)  Seated hip abd x 10 reps 4# AW x 2 each LE  Seated calf raises 2 x 15 reps BLE - 4# AW    Seated hip march- increased speed on cue to work on more coordination x 20 sec (fast as you can x 3 sets)   Seated knee ext- 4# AW with 3 sec hold 2 sets of 10 reps each LE      PATIENT EDUCATION: Education details: Exercise technique Person educated: Patient Education method: Explanation, Demonstration, Tactile cues, Verbal cues, and Handouts Education comprehension: verbalized understanding, returned demonstration, verbal cues required, tactile cues required, and needs further education  HOME EXERCISE PROGRAM: Access Code: [email or web address] URL: [email or web address] Date: 08/11/2022 Prepared by: Reyes London  Exercises - Seated Lumbar Flexion Stretch  - 7 x weekly - 3 sets - 30 sec hold - Seated Thoracic Flexion and Rotation with Arms Crossed  - 1 x daily - 7 x weekly - 3 sets - 30 sec hold - Seated  Figure 4 Piriformis Stretch  - 1 x daily - 7 x weekly - 3 sets - 10 reps  GOALS: Goals reviewed with patient? Yes  SHORT TERM GOALS: Target date: 09/20/2022  Pt will be independent with a comprehensive HEP in order to improve strength and balance in order to decrease fall risk and improve function at home and work.  Baseline: EVAL: Patient admits not performing much exercises - not moving around well. 10/25/2022-Patient reports not as compliant with previous HEP as instructed- Verbally reviewed Low back, cervical, thoracic stretching and importance of daily walking. 11/03/2022= Patient reports independent with cervical Stretches and some walking at home Goal status: MET   LONG TERM GOALS: Target date: 04/25/2023  Pt will improve FOTO to target score of 45 % to display perceived improvements in ability to complete ADL's.  Baseline: EVAL: 42: 11/03/2022= 55 Goal status: MET  2.  Pt will decrease 5TSTS by at least 5 seconds in order to demonstrate clinically significant improvement in LE strength.  Baseline: EVAL = 17.39 sec without UE support; 10/25/2022= 12.91 sec 12/2: 12.02 sec Goal status: MET  3. Pt will increase by at least 0.15 m/s in order to demonstrate clinically significant improvement in community ambulation   Baseline: EVAL= 0.67 m/s; 10/25/2022= 0.77 m/s 12/2: 0.84 m/s with 4WW Goal status: MET  4.  Pt will improve BERG by at least 3 points in order to demonstrate clinically significant improvement in balance.   Baseline: EVAL: to be assessed next visit: 08/23/2022= 36/56; 11/03/2022= 40/56 Goal status: MET  5.  Pt will decrease TUG to below 14 seconds/decrease in order to demonstrate decreased fall risk. Baseline: EVAL: 22.00 sec with upright 4WW; 10/25/2022= 15.98 sec with 4WW 12/2: 16.40 sec   Goal status: PROGRESSING  6.  Pt will increase by at least 22m (166ft) in order to demonstrate clinically significant improvement in cardiopulmonary endurance and community  ambulation  Baseline: EVAL: 3:45 min (450 feet with upright 4WW) 10/25/2022= 830 feet with 4WW Goal status: MET  7.   Patient will report returning to walking in to community places using most appropriate assistive device vs current use of scooter for improved abilities with mobility during community outings Baseline: EVAL: Current using scooter for many social/community outings; 10/25/2022- Paitent continues to scooter for long distance community distances- due to some fatigue or SOB.  12/2: Only uses scooter when he has to ge tin the yard, does not use AD in home or garage majority of the time Goal status:  PROGRESSING  8.  Pt will increase > 1000 feet in order to demonstrate clinically significant improvement in cardiopulmonary endurance and community ambulation   Baseline: 10/25/2022= 830 feet with 4WW 12/2: 919 feet with 4WW  Goal status= PROGRESSING  9.  Pt will improve BERG by at least 3 points in order to demonstrate clinically significant improvement in balance.   Baseline: 11/03/2022= 40/56 12/2: 44/56 Goal status: MET    10. Pt will transition into gym-based exercise routine upon discharge in order to maintain progress of strength and balance  Baseline: 01/31/2023: Future interventions will involve therex in [facility] to prescribe pt proper exercise routine upon discharge  Goal Status: NEW    ASSESSMENT:  CLINICAL IMPRESSION:   Treatment modified today to include some LE strengthening without aggravating low back pain symptoms. Patient was able to perform some walking with resistance and seated therex without increased report of pain. He was able to tolerate more resistance today vs. Previous session but modified exercises to avoid any lumbar flex/ext/rotation. Pt will continue to benefit from skilled PT services to address deficits and impairment identified in evaluation in order to maximize independence and safety in basic mobility required for performance of ADL, IADL, and  leisure.    OBJECTIVE IMPAIRMENTS: Abnormal gait, cardiopulmonary status limiting activity, decreased activity tolerance, decreased balance, decreased coordination, decreased endurance, decreased mobility, difficulty walking, decreased ROM, decreased strength, hypomobility, impaired flexibility, impaired sensation, impaired UE functional use, postural dysfunction, obesity, and pain.   ACTIVITY LIMITATIONS: carrying, lifting, bending, standing, squatting, stairs, transfers, continence, and reach over head  PARTICIPATION LIMITATIONS: meal prep, cleaning, laundry, driving, shopping, community activity, and yard work  PERSONAL FACTORS: Age and 3+ comorbidities: HTN, Arthritis, Low back pain, Trigeminal Neuralgia  are also affecting patient's functional outcome.   REHAB POTENTIAL: Good  CLINICAL DECISION MAKING: Evolving/moderate complexity  EVALUATION COMPLEXITY: Moderate  PLAN:  PT FREQUENCY: 1-2x/week  PT DURATION: 12 weeks  PLANNED INTERVENTIONS: Therapeutic exercises, Therapeutic activity, Neuromuscular re-education, Balance training, Gait training, Patient/Family education, Self Care, Joint mobilization, Stair training, Vestibular training, Canalith repositioning, DME instructions, Dry Needling, Electrical stimulation, Spinal manipulation, Spinal mobilization, Cryotherapy, Moist heat, Manual therapy, and Re-evaluation  PLAN FOR NEXT SESSION: Progress therex and balance training. Continue with  training in [facility] for UE/LE strengthening and cardiovascular training.    Chyrl London, PT Physical Therapist- [facility]   12:11 PM, 03/09/23

## 2023-03-09 ENCOUNTER — Ambulatory Visit: Payer: Medicare Other | Attending: Neurology

## 2023-03-09 DIAGNOSIS — R2689 Other abnormalities of gait and mobility: Secondary | ICD-10-CM

## 2023-03-09 DIAGNOSIS — M6281 Muscle weakness (generalized): Secondary | ICD-10-CM

## 2023-03-09 DIAGNOSIS — R2681 Unsteadiness on feet: Secondary | ICD-10-CM | POA: Diagnosis present

## 2023-03-09 DIAGNOSIS — R262 Difficulty in walking, not elsewhere classified: Secondary | ICD-10-CM

## 2023-03-09 DIAGNOSIS — G8929 Other chronic pain: Secondary | ICD-10-CM | POA: Diagnosis present

## 2023-03-09 DIAGNOSIS — R269 Unspecified abnormalities of gait and mobility: Secondary | ICD-10-CM

## 2023-03-09 DIAGNOSIS — R278 Other lack of coordination: Secondary | ICD-10-CM

## 2023-03-09 DIAGNOSIS — M545 Low back pain, unspecified: Secondary | ICD-10-CM | POA: Insufficient documentation

## 2023-03-10 ENCOUNTER — Ambulatory Visit: Payer: Medicare HMO | Admitting: Podiatry

## 2023-03-14 ENCOUNTER — Ambulatory Visit: Payer: Medicare Other

## 2023-03-14 DIAGNOSIS — M545 Low back pain, unspecified: Secondary | ICD-10-CM

## 2023-03-14 DIAGNOSIS — M6281 Muscle weakness (generalized): Secondary | ICD-10-CM

## 2023-03-14 DIAGNOSIS — R269 Unspecified abnormalities of gait and mobility: Secondary | ICD-10-CM

## 2023-03-14 DIAGNOSIS — R2681 Unsteadiness on feet: Secondary | ICD-10-CM

## 2023-03-14 DIAGNOSIS — R2689 Other abnormalities of gait and mobility: Secondary | ICD-10-CM

## 2023-03-14 DIAGNOSIS — R262 Difficulty in walking, not elsewhere classified: Secondary | ICD-10-CM

## 2023-03-14 DIAGNOSIS — R278 Other lack of coordination: Secondary | ICD-10-CM

## 2023-03-14 NOTE — Therapy (Signed)
 OUTPATIENT PHYSICAL THERAPY NEURO TREATMENT   Patient Name: [name] MRN: [id number] DOB:19-Feb-1946, 78 y.o., male Today's Date: 03/14/2023   PCP: Damien Ryder, PA- C REFERRING PROVIDER: Lane Arthea BRAVO, MD  END OF SESSION:  PT End of Session - 03/14/23 1109     Visit Number 46    Number of Visits 63    Date for PT Re-Evaluation 04/25/23    Authorization Type [employer]    Progress Note Due on Visit 50    PT Start Time 1105    PT Stop Time 1144    PT Time Calculation (min) 39 min    Equipment Utilized During Treatment Gait belt    Activity Tolerance Patient limited by pain    Behavior During Therapy WFL for tasks assessed/performed               Past Medical History:  Diagnosis Date   Anxiety    Arteriosclerosis of coronary artery 11/16/2011   Overview:  Stent 10/2011 stent rca 2015 with collaterals to lad which is chronically occluded    Benign enlargement of prostate    Benign essential HTN 06/11/2014   Benign prostatic hypertrophy without urinary obstruction 07/31/2014   Bilateral cataracts 05/16/2013   Overview:  [facility]     Bone spur of foot    Left   BP (high blood pressure) 11/09/2012   Cancer (HCC)    skin (forehead) and bladder   Carotid artery narrowing 02/08/2014   Depression    Detrusor hypertrophy    Diabetes (HCC)    Diabetes mellitus, type 2 (HCC) 12/12/2012   Diverticulosis    Dyspnea    Esophageal reflux    Esophageal reflux    Fothergill's neuralgia 08/07/2012   Overview:  [facility] Neurology    Gastritis    GERD (gastroesophageal reflux disease)    Headache    cluster headaches   Healed myocardial infarct 11/09/2012   Hearing loss in left ear    Heart disease    Hematuria    Hemorrhoids    History of hiatal hernia 12/14/2017   small    Hypercholesteremia    Lesion of bladder    Myocardial infarct (HCC)    Presence of stent in coronary artery 11/09/2012   Rectal bleeding    Trigeminal neuralgia    Trigeminal  neuralgia    Valvular heart disease    Vitamin D deficiency    Past Surgical History:  Procedure Laterality Date   APPENDECTOMY     BOTOX  INJECTION N/A 12/21/2017   Procedure: Bladder BOTOX  INJECTION;  Surgeon: Penne Knee, MD;  Location: [facility] ORS;  Service: Urology;  Laterality: N/A;   BOTOX  INJECTION N/A 09/11/2018   Procedure: Bladder BOTOX  INJECTION;  Surgeon: Penne Knee, MD;  Location: [facility] ORS;  Service: Urology;  Laterality: N/A;   CARDIAC CATHETERIZATION     CARDIAC CATHETERIZATION N/A 01/22/2015   Procedure: Left Heart Cath;  Surgeon: Wolm JINNY Rhyme, MD;  Location: [facility] INVASIVE CV LAB;  Service: Cardiovascular;  Laterality: N/A;   CARDIAC CATHETERIZATION N/A 01/22/2015   Procedure: Coronary Stent Intervention;  Surgeon: Marsa Dooms, MD;  Location: [facility] INVASIVE CV LAB;  Service: Cardiovascular;  Laterality: N/A;   CATARACT EXTRACTION, BILATERAL     COLONOSCOPY WITH PROPOFOL  N/A 12/08/2015   Procedure: COLONOSCOPY WITH PROPOFOL ;  Surgeon: Gladis RAYMOND Mariner, MD;  Location: [facility] ENDOSCOPY;  Service: Endoscopy;  Laterality: N/A;   COLONOSCOPY WITH PROPOFOL  N/A 12/09/2015   Procedure: COLONOSCOPY WITH PROPOFOL ;  Surgeon: Gladis RAYMOND Mariner, MD;  Location: [facility] ENDOSCOPY;  Service: Endoscopy;  Laterality: N/A;   CORONARY ANGIOPLASTY     5 stents   CORONARY STENT PLACEMENT  2015   x5   CYSTOSCOPY N/A 09/11/2018   Procedure: CYSTOSCOPY;  Surgeon: Penne Knee, MD;  Location: [facility] ORS;  Service: Urology;  Laterality: N/A;   CYSTOSCOPY WITH BIOPSY N/A 12/21/2017   Procedure: CYSTOSCOPY WITH BIOPSY;  Surgeon: Penne Knee, MD;  Location: [facility] ORS;  Service: Urology;  Laterality: N/A;   ESOPHAGOGASTRODUODENOSCOPY (EGD) WITH PROPOFOL  N/A 12/08/2015   Procedure: ESOPHAGOGASTRODUODENOSCOPY (EGD) WITH PROPOFOL ;  Surgeon: Gladis RAYMOND Mariner, MD;  Location: [facility] ENDOSCOPY;  Service: Endoscopy;  Laterality: N/A;   ESOPHAGOGASTRODUODENOSCOPY (EGD) WITH PROPOFOL  N/A 12/13/2017    Procedure: ESOPHAGOGASTRODUODENOSCOPY (EGD) WITH PROPOFOL ;  Surgeon: Mariner Gladis RAYMOND, MD;  Location: [facility] ENDOSCOPY;  Service: Endoscopy;  Laterality: N/A;   EYE SURGERY     HERNIA REPAIR     kidney tumor remove     TRANSURETHRAL RESECTION OF BLADDER TUMOR WITH GYRUS (TURBT-GYRUS)  12/2013   UMBILICAL HERNIA REPAIR     urethral meatotomy     Patient Active Problem List   Diagnosis Date Noted   Class 2 obesity due to excess calories with body mass index (BMI) of 36.0 to 36.9 in adult 04/11/2020   Swelling of limb 03/28/2020   Lymphedema 03/28/2020   Trigeminal neuralgia 12/25/2019   Lower limb ulcer, calf, left, limited to breakdown of skin (HCC) 12/25/2019   OSA (obstructive sleep apnea) 01/23/2019   Acute systolic CHF (congestive heart failure) (HCC) 12/12/2018   Bruising 07/10/2018   Diet-controlled type 2 diabetes mellitus (HCC) 03/24/2018   Microalbuminuria 03/24/2018   Dizziness 11/17/2017   Simple chronic bronchitis (HCC) 09/22/2017   SOBOE (shortness of breath on exertion) 02/18/2017   Chronic midline low back pain without sciatica 12/29/2016   Periodic limb movement disorder 12/29/2016   Benign essential tremor 06/22/2016   Pure hypercholesterolemia 06/20/2015   Erectile dysfunction due to arterial insufficiency 06/16/2015   Unstable angina (HCC) 01/22/2015   Abdominal aortic 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 rupture (HCC) 12/31/2014   Benign prostatic hyperplasia without urinary obstruction 07/31/2014   Enlarged prostate 07/31/2014   Benign essential HTN 06/11/2014   Carotid artery narrowing 02/08/2014   Carotid artery obstruction 02/08/2014   Bilateral carotid artery stenosis 02/08/2014   Chest pain 08/20/2013   Bilateral cataracts 05/16/2013   Cataract 05/16/2013   Clinical depression 12/12/2012   Diabetes mellitus, type 2 (HCC) 12/12/2012   Combined fat and carbohydrate induced hyperlipemia 12/12/2012   Major depressive disorder, single episode, unspecified 12/12/2012    Acid reflux 11/09/2012   Presence of stent in coronary artery 11/09/2012   BP (high blood pressure) 11/09/2012   Healed myocardial infarct 11/09/2012   Gastro-esophageal reflux disease without esophagitis 11/09/2012   History of cardiovascular surgery 11/09/2012   Fothergill's neuralgia 08/07/2012   Swelling of testicle 04/06/2012   Disorder of male genital organ 04/06/2012   Swelling of the testicles 04/06/2012   Arteriosclerosis of coronary artery 11/16/2011   CAD in native artery 11/16/2011   Fatigue 06/04/2011   Avitaminosis D 11/30/2010    ONSET DATE: 03/17/2022- Worsening symptoms  REFERRING DIAG:  G20.A1 (ICD-10-CM) - Parkinsons  R26.2 (ICD-10-CM) - Difficulty walking    THERAPY DIAG:  Muscle weakness (generalized)  Difficulty in walking, not elsewhere classified  Other abnormalities of gait and mobility  Abnormality of gait and mobility  Unsteadiness on feet  Other lack of coordination  Chronic  bilateral low back pain without sciatica  Rationale for Evaluation and Treatment: Rehabilitation  SUBJECTIVE:                                                                                                                                                                                             SUBJECTIVE STATEMENT:   Patient report feeling better overall- Low back is 4/10 today.   Pt accompanied by: significant other  PERTINENT HISTORY: Patient is a 78 year old male with referral to outpatient PT for Parkinsons and difficulty walking. He was recently seen by Neurology worsening symptoms of parkinson- difficulty with walking. He has past medical history of Arthritis, Bladder cancer, and Trigeminal Neuralgia.  PAIN:  Are you having pain? NO  PRECAUTIONS: Fall  WEIGHT BEARING RESTRICTIONS: No  FALLS: Has patient fallen in last 6 months? Yes. Number of falls 1  LIVING ENVIRONMENT: Lives with: lives with their spouse Lives in: House/apartment Stairs: Yes:  External: 2 steps; has grab bar Has following equipment at home: Vannie - 2 wheeled, Environmental Consultant - 4 wheeled, and Grab bars  PLOF: Independent with household mobility with device, Independent with community mobility with device, Independent with transfers, and Requires assistive device for independence  PATIENT GOALS: Get to where I can walk better again and get my legs stronger.   OBJECTIVE:    TODAY'S TREATMENT:                                                                                                                              DATE: 03/14/23     NMR:    Dynamic hip march  in place without UE support 2 sets x 20 reps alt LE   Forward/retro walking in // bars- VC for heel strike and then to take a large steps backward x 10 times.    Side stepping - wide steps in // bars without UE Support- down and back x8.   TE:   Hip march (VC for height) with UE Support 2 sets x 10   Standing ham curls (2 sets x 10  reps)    Gait-  upright 4WW- 300 feet - CGA (only minimal VC to not shuffle feet)   Standing hip Ext  x 10 reps  2 x 15 reps  each LE  Standing  calf raises 2 x 15 reps BLE - 4# AW          PATIENT EDUCATION: Education details: Exercise technique Person educated: Patient Education method: Explanation, Demonstration, Tactile cues, Verbal cues, and Handouts Education comprehension: verbalized understanding, returned demonstration, verbal cues required, tactile cues required, and needs further education  HOME EXERCISE PROGRAM: Access Code: [email or web address] URL: [email or web address] Date: 08/11/2022 Prepared by: Reyes London  Exercises - Seated Lumbar Flexion Stretch  - 7 x weekly - 3 sets - 30 sec hold - Seated Thoracic Flexion and Rotation with Arms Crossed  - 1 x daily - 7 x weekly - 3 sets - 30 sec hold - Seated Figure 4 Piriformis Stretch  - 1 x daily - 7 x weekly - 3 sets - 10 reps  GOALS: Goals reviewed with patient? Yes  SHORT TERM GOALS:  Target date: 09/20/2022  Pt will be independent with a comprehensive HEP in order to improve strength and balance in order to decrease fall risk and improve function at home and work.  Baseline: EVAL: Patient admits not performing much exercises - not moving around well. 10/25/2022-Patient reports not as compliant with previous HEP as instructed- Verbally reviewed Low back, cervical, thoracic stretching and importance of daily walking. 11/03/2022= Patient reports independent with cervical Stretches and some walking at home Goal status: MET   LONG TERM GOALS: Target date: 04/25/2023  Pt will improve FOTO to target score of 45 % to display perceived improvements in ability to complete ADL's.  Baseline: EVAL: 42: 11/03/2022= 55 Goal status: MET  2.  Pt will decrease 5TSTS by at least 5 seconds in order to demonstrate clinically significant improvement in LE strength.  Baseline: EVAL = 17.39 sec without UE support; 10/25/2022= 12.91 sec 12/2: 12.02 sec Goal status: MET  3. Pt will increase by at least 0.15 m/s in order to demonstrate clinically significant improvement in community ambulation   Baseline: EVAL= 0.67 m/s; 10/25/2022= 0.77 m/s 12/2: 0.84 m/s with 4WW Goal status: MET  4.  Pt will improve BERG by at least 3 points in order to demonstrate clinically significant improvement in balance.   Baseline: EVAL: to be assessed next visit: 08/23/2022= 36/56; 11/03/2022= 40/56 Goal status: MET  5.  Pt will decrease TUG to below 14 seconds/decrease in order to demonstrate decreased fall risk. Baseline: EVAL: 22.00 sec with upright 4WW; 10/25/2022= 15.98 sec with 4WW 12/2: 16.40 sec   Goal status: PROGRESSING  6.  Pt will increase by at least 16m (11ft) in order to demonstrate clinically significant improvement in cardiopulmonary endurance and community ambulation  Baseline: EVAL: 3:45 min (450 feet with upright 4WW) 10/25/2022= 830 feet with 4WW Goal status: MET  7.   Patient will report  returning to walking in to community places using most appropriate assistive device vs current use of scooter for improved abilities with mobility during community outings Baseline: EVAL: Current using scooter for many social/community outings; 10/25/2022- Paitent continues to scooter for long distance community distances- due to some fatigue or SOB.  12/2: Only uses scooter when he has to ge tin the yard, does not use AD in home or garage majority of the time Goal status: PROGRESSING  8.  Pt will increase > 1000 feet in order to  demonstrate clinically significant improvement in cardiopulmonary endurance and community ambulation   Baseline: 10/25/2022= 830 feet with 4WW 12/2: 919 feet with 4WW  Goal status= PROGRESSING  9.  Pt will improve BERG by at least 3 points in order to demonstrate clinically significant improvement in balance.   Baseline: 11/03/2022= 40/56 12/2: 44/56 Goal status: MET    10. Pt will transition into gym-based exercise routine upon discharge in order to maintain progress of strength and balance  Baseline: 01/31/2023: Future interventions will involve therex in [facility] to prescribe pt proper exercise routine upon discharge  Goal Status: NEW    ASSESSMENT:  CLINICAL IMPRESSION:   Treatment still modifying to adjust for recent low back pain. Patient again successfully able to improve and complete tasks without any irritation reported with low back. He was able to exhibit good foot clearance with all activities and gait with minimal VC today. Patient ill continue to benefit from skilled PT services to address deficits and impairment identified in evaluation in order to maximize independence and safety in basic mobility required for performance of ADL, IADL, and leisure.    OBJECTIVE IMPAIRMENTS: Abnormal gait, cardiopulmonary status limiting activity, decreased activity tolerance, decreased balance, decreased coordination, decreased endurance, decreased mobility,  difficulty walking, decreased ROM, decreased strength, hypomobility, impaired flexibility, impaired sensation, impaired UE functional use, postural dysfunction, obesity, and pain.   ACTIVITY LIMITATIONS: carrying, lifting, bending, standing, squatting, stairs, transfers, continence, and reach over head  PARTICIPATION LIMITATIONS: meal prep, cleaning, laundry, driving, shopping, community activity, and yard work  PERSONAL FACTORS: Age and 3+ comorbidities: HTN, Arthritis, Low back pain, Trigeminal Neuralgia  are also affecting patient's functional outcome.   REHAB POTENTIAL: Good  CLINICAL DECISION MAKING: Evolving/moderate complexity  EVALUATION COMPLEXITY: Moderate  PLAN:  PT FREQUENCY: 1-2x/week  PT DURATION: 12 weeks  PLANNED INTERVENTIONS: Therapeutic exercises, Therapeutic activity, Neuromuscular re-education, Balance training, Gait training, Patient/Family education, Self Care, Joint mobilization, Stair training, Vestibular training, Canalith repositioning, DME instructions, Dry Needling, Electrical stimulation, Spinal manipulation, Spinal mobilization, Cryotherapy, Moist heat, Manual therapy, and Re-evaluation  PLAN FOR NEXT SESSION: Progress therex and balance training. Continue with  training in [place] for UE/LE strengthening and cardiovascular training.    Chyrl London, PT Physical Therapist- [place]  [facility]   2:13 PM, 03/14/23

## 2023-03-16 ENCOUNTER — Ambulatory Visit: Payer: Medicare Other

## 2023-03-16 DIAGNOSIS — R2681 Unsteadiness on feet: Secondary | ICD-10-CM

## 2023-03-16 DIAGNOSIS — R2689 Other abnormalities of gait and mobility: Secondary | ICD-10-CM

## 2023-03-16 DIAGNOSIS — M6281 Muscle weakness (generalized): Secondary | ICD-10-CM

## 2023-03-16 DIAGNOSIS — R269 Unspecified abnormalities of gait and mobility: Secondary | ICD-10-CM

## 2023-03-16 DIAGNOSIS — R262 Difficulty in walking, not elsewhere classified: Secondary | ICD-10-CM

## 2023-03-16 NOTE — Therapy (Signed)
 OUTPATIENT PHYSICAL THERAPY TREATMENT   Patient Name: [name] MRN: [id number] DOB:[birth year], 78 y.o., male Today's Date: 03/16/2023   PCP: Madaline Scales, PA- C REFERRING PROVIDER: Bufford Carne, MD  END OF SESSION:  PT End of Session - 03/16/23 1104     Visit Number 47    Number of Visits 63    Date for PT Re-Evaluation 04/25/23    Authorization Type [employer]    Progress Note Due on Visit 50    PT Start Time 1102    PT Stop Time 1142    PT Time Calculation (min) 40 min    Activity Tolerance Patient tolerated treatment well    Behavior During Therapy [facility] for tasks assessed/performed               Past Medical History:  Diagnosis Date   Anxiety    Arteriosclerosis of coronary artery 11/16/2011   Overview:  Stent 10/2011 stent rca 2015 with collaterals to lad which is chronically occluded    Benign enlargement of prostate    Benign essential HTN 06/11/2014   Benign prostatic hypertrophy without urinary obstruction 07/31/2014   Bilateral cataracts 05/16/2013   Overview:  Dr. [facility]     Bone spur of foot    Left   BP (high blood pressure) 11/09/2012   Cancer (HCC)    skin (forehead) and bladder   Carotid artery narrowing 02/08/2014   Depression    Detrusor hypertrophy    Diabetes (HCC)    Diabetes mellitus, type 2 (HCC) 12/12/2012   Diverticulosis    Dyspnea    Esophageal reflux    Esophageal reflux    Fothergill's neuralgia 08/07/2012   Overview:  [facility]    Gastritis    GERD (gastroesophageal reflux disease)    Headache    cluster headaches   Healed myocardial infarct 11/09/2012   Hearing loss in left ear    Heart disease    Hematuria    Hemorrhoids    History of hiatal hernia 12/14/2017   small    Hypercholesteremia    Lesion of bladder    Myocardial infarct (HCC)    Presence of stent in coronary artery 11/09/2012   Rectal bleeding    Trigeminal neuralgia    Trigeminal neuralgia    Valvular heart disease     Vitamin D deficiency    Past Surgical History:  Procedure Laterality Date   APPENDECTOMY     BOTOX  INJECTION N/A 12/21/2017   Procedure: Bladder BOTOX  INJECTION;  Surgeon: Dustin Gimenez, MD;  Location: [facility] ORS;  Service: Urology;  Laterality: N/A;   BOTOX  INJECTION N/A 09/11/2018   Procedure: Bladder BOTOX  INJECTION;  Surgeon: Dustin Gimenez, MD;  Location: [facility] ORS;  Service: Urology;  Laterality: N/A;   CARDIAC CATHETERIZATION     CARDIAC CATHETERIZATION N/A 01/22/2015   Procedure: Left Heart Cath;  Surgeon: Michelle Aid, MD;  Location: [facility] INVASIVE CV LAB;  Service: Cardiovascular;  Laterality: N/A;   CARDIAC CATHETERIZATION N/A 01/22/2015   Procedure: Coronary Stent Intervention;  Surgeon: Percival Brace, MD;  Location: [facility] INVASIVE CV LAB;  Service: Cardiovascular;  Laterality: N/A;   CATARACT EXTRACTION, BILATERAL     COLONOSCOPY WITH PROPOFOL  N/A 12/08/2015   Procedure: COLONOSCOPY WITH PROPOFOL ;  Surgeon: Deveron Fly, MD;  Location: [facility] ENDOSCOPY;  Service: Endoscopy;  Laterality: N/A;   COLONOSCOPY WITH PROPOFOL  N/A 12/09/2015   Procedure: COLONOSCOPY WITH PROPOFOL ;  Surgeon: Deveron Fly, MD;  Location: [facility] ENDOSCOPY;  Service: Endoscopy;  Laterality: N/A;   CORONARY ANGIOPLASTY     5 stents   CORONARY STENT PLACEMENT  2015   x5   CYSTOSCOPY N/A 09/11/2018   Procedure: CYSTOSCOPY;  Surgeon: Dustin Gimenez, MD;  Location: [facility] ORS;  Service: Urology;  Laterality: N/A;   CYSTOSCOPY WITH BIOPSY N/A 12/21/2017   Procedure: CYSTOSCOPY WITH BIOPSY;  Surgeon: Dustin Gimenez, MD;  Location: [facility] ORS;  Service: Urology;  Laterality: N/A;   ESOPHAGOGASTRODUODENOSCOPY (EGD) WITH PROPOFOL  N/A 12/08/2015   Procedure: ESOPHAGOGASTRODUODENOSCOPY (EGD) WITH PROPOFOL ;  Surgeon: Deveron Fly, MD;  Location: [facility] ENDOSCOPY;  Service: Endoscopy;  Laterality: N/A;   ESOPHAGOGASTRODUODENOSCOPY (EGD) WITH PROPOFOL  N/A 12/13/2017   Procedure: ESOPHAGOGASTRODUODENOSCOPY  (EGD) WITH PROPOFOL ;  Surgeon: Deveron Fly, MD;  Location: [facility] ENDOSCOPY;  Service: Endoscopy;  Laterality: N/A;   EYE SURGERY     HERNIA REPAIR     kidney tumor remove     TRANSURETHRAL RESECTION OF BLADDER TUMOR WITH GYRUS (TURBT-GYRUS)  12/2013   UMBILICAL HERNIA REPAIR     urethral meatotomy     Patient Active Problem List   Diagnosis Date Noted   Class 2 obesity due to excess calories with body mass index (BMI) of 36.0 to 36.9 in adult 04/11/2020   Swelling of limb 03/28/2020   Lymphedema 03/28/2020   Trigeminal neuralgia 12/25/2019   Lower limb ulcer, calf, left, limited to breakdown of skin (HCC) 12/25/2019   OSA (obstructive sleep apnea) 01/23/2019   Acute systolic CHF (congestive heart failure) (HCC) 12/12/2018   Bruising 07/10/2018   Diet-controlled type 2 diabetes mellitus (HCC) 03/24/2018   Microalbuminuria 03/24/2018   Dizziness 11/17/2017   Simple chronic bronchitis (HCC) 09/22/2017   SOBOE (shortness of breath on exertion) 02/18/2017   Chronic midline low back pain without sciatica 12/29/2016   Periodic limb movement disorder 12/29/2016   Benign essential tremor 06/22/2016   Pure hypercholesterolemia 06/20/2015   Erectile dysfunction due to arterial insufficiency 06/16/2015   Unstable angina (HCC) 01/22/2015   Abdominal aortic 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 rupture (HCC) 12/31/2014   Benign prostatic hyperplasia without urinary obstruction 07/31/2014   Enlarged prostate 07/31/2014   Benign essential HTN 06/11/2014   Carotid artery narrowing 02/08/2014   Carotid artery obstruction 02/08/2014   Bilateral carotid artery stenosis 02/08/2014   Chest pain 08/20/2013   Bilateral cataracts 05/16/2013   Cataract 05/16/2013   Clinical depression 12/12/2012   Diabetes mellitus, type 2 (HCC) 12/12/2012   Combined fat and carbohydrate induced hyperlipemia 12/12/2012   Major depressive disorder, single episode, unspecified 12/12/2012   Acid reflux 11/09/2012   Presence of  stent in coronary artery 11/09/2012   BP (high blood pressure) 11/09/2012   Healed myocardial infarct 11/09/2012   Gastro-esophageal reflux disease without esophagitis 11/09/2012   History of cardiovascular surgery 11/09/2012   Fothergill's neuralgia 08/07/2012   Swelling of testicle 04/06/2012   Disorder of male genital organ 04/06/2012   Swelling of the testicles 04/06/2012   Arteriosclerosis of coronary artery 11/16/2011   CAD in native artery 11/16/2011   Fatigue 06/04/2011   Avitaminosis D 11/30/2010    ONSET DATE: 03/17/2022- Worsening symptoms  REFERRING DIAG:  G20.A1 (ICD-10-CM) - Parkinsons  R26.2 (ICD-10-CM) - Difficulty walking    THERAPY DIAG:  Muscle weakness (generalized)  Difficulty in walking, not elsewhere classified  Other abnormalities of gait and mobility  Abnormality of gait and mobility  Unsteadiness on feet  Rationale for Evaluation and Treatment: Rehabilitation  SUBJECTIVE:  SUBJECTIVE STATEMENT:   Patient report feeling better overall- Low back is 4/10 today. Pt sees cardiology later today for FU.    Pt accompanied by: significant other  PERTINENT HISTORY: Patient is a 78 year old male with referral to outpatient PT for Parkinsons and difficulty walking. He was recently seen by Neurology worsening symptoms of parkinson- difficulty with walking. He has past medical history of Arthritis, Bladder cancer, and Trigeminal Neuralgia.  PAIN:  Are you having pain? Yes 4/10 low back   PRECAUTIONS: Fall  WEIGHT BEARING RESTRICTIONS: No  FALLS: Has patient fallen in last 6 months? Yes. Number of falls 1  LIVING ENVIRONMENT: Lives with: lives with their spouse Lives in: House/apartment Stairs: Yes: External: 2 steps; has grab bar Has following equipment at home:  Otho Blitz - 2 wheeled, Environmental consultant - 4 wheeled, and Grab bars  PLOF: Independent with household mobility with device, Independent with community mobility with device, Independent with transfers, and Requires assistive device for independence  PATIENT GOALS: Get to where I can walk better again and get my legs stronger.   OBJECTIVE:    TODAY'S TREATMENT:                                                                                                                              DATE: 03/16/23     Vitals assessment: 126/14mmHg 64bpm  -hot pack applied to lumbar spine in chair   -471ft AMB c overground c upwalker, no weights this time (0.67m/s)  *3 minutes recovery  -456ft AMB c overground c upwalker, no weights this time (3 minutes 15 seconds)  *3 minutes recovery -457ft AMB c overground c upwalker, no weights this time  *3 minutes recovery  -24 steps (12 up, 12 down) c 3lb AW bilat railings ad lib, uses a step-over-step technique, minGuard assist  *3 minutes recovery -24 steps (12 up, 12 down) c 3lb AW bilat railings ad lib, uses a step-over-step technique, minGuard assist  *3 minutes recovery  C 3lb AW, alternating 90 degree turns (Left/Right) without UE support x16 (no LOB, no dizziness)     PATIENT EDUCATION: Education details: Exercise technique Person educated: Patient Education method: Explanation, Demonstration, Tactile cues, Verbal cues, and Handouts Education comprehension: verbalized understanding, returned demonstration, verbal cues required, tactile cues required, and needs further education  HOME EXERCISE PROGRAM: Access Code: [email or web address] URL: [email or web address] Date: 08/11/2022 Prepared by: Ferrell Hu  Exercises - Seated Lumbar Flexion Stretch  - 7 x weekly - 3 sets - 30 sec hold - Seated Thoracic Flexion and Rotation with Arms Crossed  - 1 x daily - 7 x weekly - 3 sets - 30 sec hold - Seated Figure 4 Piriformis Stretch  - 1 x daily - 7 x weekly  - 3 sets - 10 reps  GOALS: Goals reviewed with patient? Yes  SHORT TERM GOALS: Target date: 09/20/2022  Pt will be independent with a comprehensive HEP in order to improve strength and balance  in order to decrease fall risk and improve function at home and work.  Baseline: EVAL: Patient admits not performing much exercises - not moving around well. 10/25/2022-Patient reports not as compliant with previous HEP as instructed- Verbally reviewed Low back, cervical, thoracic stretching and importance of daily walking. 11/03/2022= Patient reports independent with cervical Stretches and some walking at home Goal status: MET   LONG TERM GOALS: Target date: 04/25/2023  Pt will improve FOTO to target score of 45 % to display perceived improvements in ability to complete ADL's.  Baseline: EVAL: 42: 11/03/2022= 55 Goal status: MET  2.  Pt will decrease 5TSTS by at least 5 seconds in order to demonstrate clinically significant improvement in LE strength.  Baseline: EVAL = 17.39 sec without UE support; 10/25/2022= 12.91 sec 12/2: 12.02 sec Goal status: MET  3. Pt will increase by at least 0.15 m/s in order to demonstrate clinically significant improvement in community ambulation   Baseline: EVAL= 0.67 m/s; 10/25/2022= 0.77 m/s 12/2: 0.84 m/s with 4WW Goal status: MET  4.  Pt will improve BERG by at least 3 points in order to demonstrate clinically significant improvement in balance.   Baseline: EVAL: to be assessed next visit: 08/23/2022= 36/56; 11/03/2022= 40/56 Goal status: MET  5.  Pt will decrease TUG to below 14 seconds/decrease in order to demonstrate decreased fall risk. Baseline: EVAL: 22.00 sec with upright 4WW; 10/25/2022= 15.98 sec with 4WW 12/2: 16.40 sec   Goal status: PROGRESSING  6.  Pt will increase by at least 51m (179ft) in order to demonstrate clinically significant improvement in cardiopulmonary endurance and community ambulation  Baseline: EVAL: 3:45 min (450 feet with  upright 4WW) 10/25/2022= 830 feet with 4WW Goal status: MET  7.   Patient will report returning to walking in to community places using most appropriate assistive device vs current use of scooter for improved abilities with mobility during community outings Baseline: EVAL: Current using scooter for many social/community outings; 10/25/2022- Paitent continues to scooter for long distance community distances- due to some fatigue or SOB.  12/2: Only uses scooter when he has to ge tin the yard, does not use AD in home or garage majority of the time Goal status: PROGRESSING  8.  Pt will increase > 1000 feet in order to demonstrate clinically significant improvement in cardiopulmonary endurance and community ambulation   Baseline: 10/25/2022= 830 feet with 4WW 12/2: 919 feet with 4WW  Goal status= PROGRESSING  9.  Pt will improve BERG by at least 3 points in order to demonstrate clinically significant improvement in balance.   Baseline: 11/03/2022= 40/56 12/2: 44/56 Goal status: MET    10. Pt will transition into gym-based exercise routine upon discharge in order to maintain progress of strength and balance  Baseline: 01/31/2023: Future interventions will involve therex in [facility] to prescribe pt proper exercise routine upon discharge  Goal Status: NEW    ASSESSMENT:  CLINICAL IMPRESSION:   Advanced AMB intervals this date, but eschewed AW to see if less provoking of back pain. Incorporated some high volume stairs climbing to integrate leg strength, balance, coordination and high intensty intervals. Good tolerance. Good time. Patient will continue to benefit from skilled PT services to address deficits and impairment identified in evaluation in order to maximize independence and safety in basic mobility required for performance of ADL, IADL, and leisure.    OBJECTIVE IMPAIRMENTS: Abnormal gait, cardiopulmonary status limiting activity, decreased activity tolerance, decreased balance,  decreased coordination, decreased endurance, decreased  mobility, difficulty walking, decreased ROM, decreased strength, hypomobility, impaired flexibility, impaired sensation, impaired UE functional use, postural dysfunction, obesity, and pain.   ACTIVITY LIMITATIONS: carrying, lifting, bending, standing, squatting, stairs, transfers, continence, and reach over head  PARTICIPATION LIMITATIONS: meal prep, cleaning, laundry, driving, shopping, community activity, and yard work  PERSONAL FACTORS: Age and 3+ comorbidities: HTN, Arthritis, Low back pain, Trigeminal Neuralgia  are also affecting patient's functional outcome.   REHAB POTENTIAL: Good  CLINICAL DECISION MAKING: Evolving/moderate complexity  EVALUATION COMPLEXITY: Moderate  PLAN:  PT FREQUENCY: 1-2x/week  PT DURATION: 12 weeks  PLANNED INTERVENTIONS: Therapeutic exercises, Therapeutic activity, Neuromuscular re-education, Balance training, Gait training, Patient/Family education, Self Care, Joint mobilization, Stair training, Vestibular training, Canalith repositioning, DME instructions, Dry Needling, Electrical stimulation, Spinal manipulation, Spinal mobilization, Cryotherapy, Moist heat, Manual therapy, and Re-evaluation  PLAN FOR NEXT SESSION: Progress therex and balance training. Continue with  training in [place] for UE/LE strengthening and cardiovascular training.    11:19 AM, 03/16/23 Dawn Eth, PT, DPT Physical Therapist - [facility]  [phone] [facility])    11:14 AM, 03/16/23

## 2023-03-18 ENCOUNTER — Other Ambulatory Visit: Payer: Self-pay | Admitting: Internal Medicine

## 2023-03-21 ENCOUNTER — Ambulatory Visit: Payer: Medicare Other

## 2023-03-21 DIAGNOSIS — R2689 Other abnormalities of gait and mobility: Secondary | ICD-10-CM

## 2023-03-21 DIAGNOSIS — M6281 Muscle weakness (generalized): Secondary | ICD-10-CM | POA: Diagnosis not present

## 2023-03-21 DIAGNOSIS — R2681 Unsteadiness on feet: Secondary | ICD-10-CM

## 2023-03-21 DIAGNOSIS — M545 Low back pain, unspecified: Secondary | ICD-10-CM

## 2023-03-21 DIAGNOSIS — R278 Other lack of coordination: Secondary | ICD-10-CM

## 2023-03-21 DIAGNOSIS — R269 Unspecified abnormalities of gait and mobility: Secondary | ICD-10-CM

## 2023-03-21 DIAGNOSIS — R262 Difficulty in walking, not elsewhere classified: Secondary | ICD-10-CM
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
## 2023-03-22 ENCOUNTER — Ambulatory Visit: Payer: Medicare HMO | Admitting: Dermatology

## 2023-03-23 ENCOUNTER — Ambulatory Visit: Payer: Medicare Other

## 2023-03-28 ENCOUNTER — Ambulatory Visit: Payer: Medicare Other

## 2023-03-28 DIAGNOSIS — R269 Unspecified abnormalities of gait and mobility: Secondary | ICD-10-CM

## 2023-03-28 DIAGNOSIS — R2689 Other abnormalities of gait and mobility: Secondary | ICD-10-CM

## 2023-03-28 DIAGNOSIS — R278 Other lack of coordination: Secondary | ICD-10-CM

## 2023-03-28 DIAGNOSIS — R262 Difficulty in walking, not elsewhere classified: Secondary | ICD-10-CM

## 2023-03-28 DIAGNOSIS — M6281 Muscle weakness (generalized): Secondary | ICD-10-CM

## 2023-03-28 DIAGNOSIS — R2681 Unsteadiness on feet: Secondary | ICD-10-CM

## 2023-03-28 DIAGNOSIS — M545 Low back pain, unspecified: Secondary | ICD-10-CM

## 2023-03-28 NOTE — Therapy (Signed)
OUTPATIENT PHYSICAL THERAPY TREATMENT   Patient Name: [name] MRN: [id number] DOB:[birth year], 78 y.o., male Today's Date: 03/28/2023   PCP: Ardyth Man, PA- C REFERRING PROVIDER: Morene Crocker, MD  END OF SESSION:  PT End of Session - 03/28/23 1125     Visit Number 49    Number of Visits 63    Date for PT Re-Evaluation 04/25/23    Authorization Type [employer]    Progress Note Due on Visit 50    PT Start Time 1100    PT Stop Time 1145    PT Time Calculation (min) 45 min    Equipment Utilized During Treatment Gait belt    Activity Tolerance Patient tolerated treatment well    Behavior During Therapy WFL for tasks assessed/performed                 Past Medical History:  Diagnosis Date   Anxiety    Arteriosclerosis of coronary artery 11/16/2011   Overview:  Stent 10/2011 stent rca 2015 with collaterals to lad which is chronically occluded    Benign enlargement of prostate    Benign essential HTN 06/11/2014   Benign prostatic hypertrophy without urinary obstruction 07/31/2014   Bilateral cataracts 05/16/2013   Overview:  [facility]     Bone spur of foot    Left   BP (high blood pressure) 11/09/2012   Cancer (HCC)    skin (forehead) and bladder   Carotid artery narrowing 02/08/2014   Depression    Detrusor hypertrophy    Diabetes (HCC)    Diabetes mellitus, type 2 (HCC) 12/12/2012   Diverticulosis    Dyspnea    Esophageal reflux    Esophageal reflux    Fothergill's neuralgia 08/07/2012   Overview:  [facility]    Gastritis    GERD (gastroesophageal reflux disease)    Headache    cluster headaches   Healed myocardial infarct 11/09/2012   Hearing loss in left ear    Heart disease    Hematuria    Hemorrhoids    History of hiatal hernia 12/14/2017   small    Hypercholesteremia    Lesion of bladder    Myocardial infarct (HCC)    Presence of stent in coronary artery 11/09/2012   Rectal bleeding    Trigeminal neuralgia     Trigeminal neuralgia    Valvular heart disease    Vitamin D deficiency    Past Surgical History:  Procedure Laterality Date   APPENDECTOMY     BOTOX INJECTION N/A 12/21/2017   Procedure: Bladder BOTOX INJECTION;  Surgeon: Vanna Scotland, MD;  Location: [facility] ORS;  Service: Urology;  Laterality: N/A;   BOTOX INJECTION N/A 09/11/2018   Procedure: Bladder BOTOX INJECTION;  Surgeon: Vanna Scotland, MD;  Location: [facility] ORS;  Service: Urology;  Laterality: N/A;   CARDIAC CATHETERIZATION     CARDIAC CATHETERIZATION N/A 01/22/2015   Procedure: Left Heart Cath;  Surgeon: Lamar Blinks, MD;  Location: [facility] INVASIVE CV LAB;  Service: Cardiovascular;  Laterality: N/A;   CARDIAC CATHETERIZATION N/A 01/22/2015   Procedure: Coronary Stent Intervention;  Surgeon: Marcina Millard, MD;  Location: [facility] INVASIVE CV LAB;  Service: Cardiovascular;  Laterality: N/A;   CATARACT EXTRACTION, BILATERAL     COLONOSCOPY WITH PROPOFOL N/A 12/08/2015   Procedure: COLONOSCOPY WITH PROPOFOL;  Surgeon: Christena Deem, MD;  Location: [facility] ENDOSCOPY;  Service: Endoscopy;  Laterality: N/A;   COLONOSCOPY WITH PROPOFOL N/A 12/09/2015   Procedure: COLONOSCOPY WITH PROPOFOL;  Surgeon: Christena Deem, MD;  Location: [facility] ENDOSCOPY;  Service: Endoscopy;  Laterality: N/A;   CORONARY ANGIOPLASTY     5 stents   CORONARY STENT PLACEMENT  2015   x5   CYSTOSCOPY N/A 09/11/2018   Procedure: CYSTOSCOPY;  Surgeon: Vanna Scotland, MD;  Location: [facility] ORS;  Service: Urology;  Laterality: N/A;   CYSTOSCOPY WITH BIOPSY N/A 12/21/2017   Procedure: CYSTOSCOPY WITH BIOPSY;  Surgeon: Vanna Scotland, MD;  Location: [facility] ORS;  Service: Urology;  Laterality: N/A;   ESOPHAGOGASTRODUODENOSCOPY (EGD) WITH PROPOFOL N/A 12/08/2015   Procedure: ESOPHAGOGASTRODUODENOSCOPY (EGD) WITH PROPOFOL;  Surgeon: Christena Deem, MD;  Location: [facility] ENDOSCOPY;  Service: Endoscopy;  Laterality: N/A;   ESOPHAGOGASTRODUODENOSCOPY (EGD) WITH PROPOFOL N/A  12/13/2017   Procedure: ESOPHAGOGASTRODUODENOSCOPY (EGD) WITH PROPOFOL;  Surgeon: Christena Deem, MD;  Location: [facility] ENDOSCOPY;  Service: Endoscopy;  Laterality: N/A;   EYE SURGERY     HERNIA REPAIR     kidney tumor remove     TRANSURETHRAL RESECTION OF BLADDER TUMOR WITH GYRUS (TURBT-GYRUS)  12/2013   UMBILICAL HERNIA REPAIR     urethral meatotomy     Patient Active Problem List   Diagnosis Date Noted   Class 2 obesity due to excess calories with body mass index (BMI) of 36.0 to 36.9 in adult 04/11/2020   Swelling of limb 03/28/2020   Lymphedema 03/28/2020   Trigeminal neuralgia 12/25/2019   Lower limb ulcer, calf, left, limited to breakdown of skin (HCC) 12/25/2019   OSA (obstructive sleep apnea) 01/23/2019   Acute systolic CHF (congestive heart failure) (HCC) 12/12/2018   Bruising 07/10/2018   Diet-controlled type 2 diabetes mellitus (HCC) 03/24/2018   Microalbuminuria 03/24/2018   Dizziness 11/17/2017   Simple chronic bronchitis (HCC) 09/22/2017   SOBOE (shortness of breath on exertion) 02/18/2017   Chronic midline low back pain without sciatica 12/29/2016   Periodic limb movement disorder 12/29/2016   Benign essential tremor 06/22/2016   Pure hypercholesterolemia 06/20/2015   Erectile dysfunction due to arterial insufficiency 06/16/2015   Unstable angina (HCC) 01/22/2015   Abdominal aortic 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 rupture (HCC) 12/31/2014   Benign prostatic hyperplasia without urinary obstruction 07/31/2014   Enlarged prostate 07/31/2014   Benign essential HTN 06/11/2014   Carotid artery narrowing 02/08/2014   Carotid artery obstruction 02/08/2014   Bilateral carotid artery stenosis 02/08/2014   Chest pain 08/20/2013   Bilateral cataracts 05/16/2013   Cataract 05/16/2013   Clinical depression 12/12/2012   Diabetes mellitus, type 2 (HCC) 12/12/2012   Combined fat and carbohydrate induced hyperlipemia 12/12/2012   Major depressive disorder, single episode, unspecified  12/12/2012   Acid reflux 11/09/2012   Presence of stent in coronary artery 11/09/2012   BP (high blood pressure) 11/09/2012   Healed myocardial infarct 11/09/2012   Gastro-esophageal reflux disease without esophagitis 11/09/2012   History of cardiovascular surgery 11/09/2012   Fothergill's neuralgia 08/07/2012   Swelling of testicle 04/06/2012   Disorder of male genital organ 04/06/2012   Swelling of the testicles 04/06/2012   Arteriosclerosis of coronary artery 11/16/2011   CAD in native artery 11/16/2011   Fatigue 06/04/2011   Avitaminosis D 11/30/2010    ONSET DATE: 03/17/2022- Worsening symptoms  REFERRING DIAG:  G20.A1 (ICD-10-CM) - Parkinsons  R26.2 (ICD-10-CM) - Difficulty walking    THERAPY DIAG:  Muscle weakness (generalized)  Difficulty in walking, not elsewhere classified  Other abnormalities of gait and mobility  Abnormality of gait and mobility  Unsteadiness on feet  Other lack of coordination  Chronic  bilateral low back pain without sciatica  Rationale for Evaluation and Treatment: Rehabilitation  SUBJECTIVE:                                                                                                                                                                                             SUBJECTIVE STATEMENT:   Patient reports feeling better overall- stating he had a good week.   Pt accompanied by: significant other  PERTINENT HISTORY: Patient is a 78 year old male with referral to outpatient PT for Parkinsons and difficulty walking. He was recently seen by Neurology worsening symptoms of parkinson- difficulty with walking. He has past medical history of Arthritis, Bladder cancer, and Trigeminal Neuralgia.  PAIN:  Are you having pain? Yes 4/10 low back   PRECAUTIONS: Fall  WEIGHT BEARING RESTRICTIONS: No  FALLS: Has patient fallen in last 6 months? Yes. Number of falls 1  LIVING ENVIRONMENT: Lives with: lives with their spouse Lives in:  House/apartment Stairs: Yes: External: 2 steps; has grab bar Has following equipment at home: Dan Humphreys - 2 wheeled, Environmental consultant - 4 wheeled, and Grab bars  PLOF: Independent with household mobility with device, Independent with community mobility with device, Independent with transfers, and Requires assistive device for independence  PATIENT GOALS: Get to where I can walk better again and get my legs stronger.   OBJECTIVE:    TODAY'S TREATMENT:                                                                                                                              DATE: 03/28/23     THEREX Nustep- L1- 4 x progressive resistance without report of pain.   Sit to stand with UE overhead raises x 15 reps  Seated Hip march  2 sets x 10 reps Seated LAQ 2 sets x10 reps  NMR:  Resistive gait with upright 4WW- 450 feet with intermittent VC to increase step height - 3 #AW -Forward/backward step up/over 1/2 foam roll- 3# AW x 20 reps - Lateral step up/over 1/2 foam roll 3# AW x  20 reps -Step tap onto 1/2 foam without UE support x 15 reps alt LE    PATIENT EDUCATION: Education details: Exercise technique Person educated: Patient Education method: Explanation, Demonstration, Tactile cues, Verbal cues, and Handouts Education comprehension: verbalized understanding, returned demonstration, verbal cues required, tactile cues required, and needs further education  HOME EXERCISE PROGRAM: Access Code: [email or web address] URL: [email or web address] Date: 08/11/2022 Prepared by: Maureen Ralphs  Exercises - Seated Lumbar Flexion Stretch  - 7 x weekly - 3 sets - 30 sec hold - Seated Thoracic Flexion and Rotation with Arms Crossed  - 1 x daily - 7 x weekly - 3 sets - 30 sec hold - Seated Figure 4 Piriformis Stretch  - 1 x daily - 7 x weekly - 3 sets - 10 reps  GOALS: Goals reviewed with patient? Yes  SHORT TERM GOALS: Target date: 09/20/2022  Pt will be independent with a  comprehensive HEP in order to improve strength and balance in order to decrease fall risk and improve function at home and work.  Baseline: EVAL: Patient admits not performing much exercises - not moving around well. 10/25/2022-Patient reports not as compliant with previous HEP as instructed- Verbally reviewed Low back, cervical, thoracic stretching and importance of daily walking. 11/03/2022= Patient reports independent with cervical Stretches and some walking at home Goal status: MET   LONG TERM GOALS: Target date: 04/25/2023  Pt will improve FOTO to target score of 45 % to display perceived improvements in ability to complete ADL's.  Baseline: EVAL: 42: 11/03/2022= 55 Goal status: MET  2.  Pt will decrease 5TSTS by at least 5 seconds in order to demonstrate clinically significant improvement in LE strength.  Baseline: EVAL = 17.39 sec without UE support; 10/25/2022= 12.91 sec 12/2: 12.02 sec Goal status: MET  3. Pt will increase by at least 0.15 m/s in order to demonstrate clinically significant improvement in community ambulation   Baseline: EVAL= 0.67 m/s; 10/25/2022= 0.77 m/s 12/2: 0.84 m/s with 4WW Goal status: MET  4.  Pt will improve BERG by at least 3 points in order to demonstrate clinically significant improvement in balance.   Baseline: EVAL: to be assessed next visit: 08/23/2022= 36/56; 11/03/2022= 40/56 Goal status: MET  5.  Pt will decrease TUG to below 14 seconds/decrease in order to demonstrate decreased fall risk. Baseline: EVAL: 22.00 sec with upright 4WW; 10/25/2022= 15.98 sec with 4WW 12/2: 16.40 sec   Goal status: PROGRESSING  6.  Pt will increase by at least 37m (12ft) in order to demonstrate clinically significant improvement in cardiopulmonary endurance and community ambulation  Baseline: EVAL: 3:45 min (450 feet with upright 4WW) 10/25/2022= 830 feet with 4WW Goal status: MET  7.   Patient will report returning to walking in to community places using most  appropriate assistive device vs current use of scooter for improved abilities with mobility during community outings Baseline: EVAL: Current using scooter for many social/community outings; 10/25/2022- Paitent continues to scooter for long distance community distances- due to some fatigue or SOB.  12/2: Only uses scooter when he has to ge tin the yard, does not use AD in home or garage majority of the time Goal status: PROGRESSING  8.  Pt will increase > 1000 feet in order to demonstrate clinically significant improvement in cardiopulmonary endurance and community ambulation   Baseline: 10/25/2022= 830 feet with 4WW 12/2: 919 feet with 4WW  Goal status= PROGRESSING  9.  Pt will improve BERG by at least 3 points in  order to demonstrate clinically significant improvement in balance.   Baseline: 11/03/2022= 40/56 12/2: 44/56 Goal status: MET    10. Pt will transition into gym-based exercise routine upon discharge in order to maintain progress of strength and balance  Baseline: 01/31/2023: Future interventions will involve therex in [facility] to prescribe pt proper exercise routine upon discharge  Goal Status: NEW    ASSESSMENT:  CLINICAL IMPRESSION:   Patient presents with less overall complaint of pain today. He was responsive to VC with improved step height and length. He was able to perform well with resistance with walking and later with exercises and able to perform resistive Nustep for longer duration.  Patient will continue to benefit from skilled PT services to address deficits and impairment identified in evaluation in order to maximize independence and safety in basic mobility required for performance of ADL, IADL, and leisure.    OBJECTIVE IMPAIRMENTS: Abnormal gait, cardiopulmonary status limiting activity, decreased activity tolerance, decreased balance, decreased coordination, decreased endurance, decreased mobility, difficulty walking, decreased ROM, decreased strength,  hypomobility, impaired flexibility, impaired sensation, impaired UE functional use, postural dysfunction, obesity, and pain.   ACTIVITY LIMITATIONS: carrying, lifting, bending, standing, squatting, stairs, transfers, continence, and reach over head  PARTICIPATION LIMITATIONS: meal prep, cleaning, laundry, driving, shopping, community activity, and yard work  PERSONAL FACTORS: Age and 3+ comorbidities: HTN, Arthritis, Low back pain, Trigeminal Neuralgia  are also affecting patient's functional outcome.   REHAB POTENTIAL: Good  CLINICAL DECISION MAKING: Evolving/moderate complexity  EVALUATION COMPLEXITY: Moderate  PLAN:  PT FREQUENCY: 1-2x/week  PT DURATION: 12 weeks  PLANNED INTERVENTIONS: Therapeutic exercises, Therapeutic activity, Neuromuscular re-education, Balance training, Gait training, Patient/Family education, Self Care, Joint mobilization, Stair training, Vestibular training, Canalith repositioning, DME instructions, Dry Needling, Electrical stimulation, Spinal manipulation, Spinal mobilization, Cryotherapy, Moist heat, Manual therapy, and Re-evaluation  PLAN FOR NEXT SESSION: Progress therex and balance training. Continue with  training in [place] for UE/LE strengthening and cardiovascular training.    1:00 PM, 03/28/23 Louis Meckel, PT Physical Therapist - [facility]  [phone] (ASCOM)    1:00 PM, 03/28/23

## 2023-03-30 ENCOUNTER — Ambulatory Visit: Payer: Medicare Other

## 2023-03-30 DIAGNOSIS — M6281 Muscle weakness (generalized): Secondary | ICD-10-CM

## 2023-03-30 DIAGNOSIS — M545 Low back pain, unspecified: Secondary | ICD-10-CM

## 2023-03-30 DIAGNOSIS — R2689 Other abnormalities of gait and mobility: Secondary | ICD-10-CM

## 2023-03-30 DIAGNOSIS — R278 Other lack of coordination: Secondary | ICD-10-CM

## 2023-03-30 DIAGNOSIS — R269 Unspecified abnormalities of gait and mobility: Secondary | ICD-10-CM

## 2023-03-30 DIAGNOSIS — R2681 Unsteadiness on feet: Secondary | ICD-10-CM

## 2023-03-30 DIAGNOSIS — R262 Difficulty in walking, not elsewhere classified: Secondary | ICD-10-CM

## 2023-03-30 NOTE — Therapy (Signed)
OUTPATIENT PHYSICAL THERAPY TREATMENT/Physical Therapy Progress Note   Dates of reporting period  02/02/2023   to   03/30/2023    Patient Name: [name] MRN: [id number] DOB:12-06-1945, 78 y.o., male Today's Date: 03/30/2023   PCP: Ardyth Man, PA- C REFERRING PROVIDER: Morene Crocker, MD  END OF SESSION:  PT End of Session - 03/30/23 1103     Visit Number 50    Number of Visits 63    Date for PT Re-Evaluation 04/25/23    Authorization Type [employer]    Progress Note Due on Visit 50    PT Start Time 1100    PT Stop Time 1146    PT Time Calculation (min) 46 min    Equipment Utilized During Treatment Gait belt    Activity Tolerance Patient tolerated treatment well    Behavior During Therapy WFL for tasks assessed/performed                  Past Medical History:  Diagnosis Date   Anxiety    Arteriosclerosis of coronary artery 11/16/2011   Overview:  Stent 10/2011 stent rca 2015 with collaterals to lad which is chronically occluded    Benign enlargement of prostate    Benign essential HTN 06/11/2014   Benign prostatic hypertrophy without urinary obstruction 07/31/2014   Bilateral cataracts 05/16/2013   Overview:  [facility]     Bone spur of foot    Left   BP (high blood pressure) 11/09/2012   Cancer (HCC)    skin (forehead) and bladder   Carotid artery narrowing 02/08/2014   Depression    Detrusor hypertrophy    Diabetes (HCC)    Diabetes mellitus, type 2 (HCC) 12/12/2012   Diverticulosis    Dyspnea    Esophageal reflux    Esophageal reflux    Fothergill's neuralgia 08/07/2012   Overview:  [facility]    Gastritis    GERD (gastroesophageal reflux disease)    Headache    cluster headaches   Healed myocardial infarct 11/09/2012   Hearing loss in left ear    Heart disease    Hematuria    Hemorrhoids    History of hiatal hernia 12/14/2017   small    Hypercholesteremia    Lesion of bladder    Myocardial infarct (HCC)     Presence of stent in coronary artery 11/09/2012   Rectal bleeding    Trigeminal neuralgia    Trigeminal neuralgia    Valvular heart disease    Vitamin D deficiency    Past Surgical History:  Procedure Laterality Date   APPENDECTOMY     BOTOX INJECTION N/A 12/21/2017   Procedure: Bladder BOTOX INJECTION;  Surgeon: Vanna Scotland, MD;  Location: [facility] ORS;  Service: Urology;  Laterality: N/A;   BOTOX INJECTION N/A 09/11/2018   Procedure: Bladder BOTOX INJECTION;  Surgeon: Vanna Scotland, MD;  Location: [facility] ORS;  Service: Urology;  Laterality: N/A;   CARDIAC CATHETERIZATION     CARDIAC CATHETERIZATION N/A 01/22/2015   Procedure: Left Heart Cath;  Surgeon: Lamar Blinks, MD;  Location: [facility] INVASIVE CV LAB;  Service: Cardiovascular;  Laterality: N/A;   CARDIAC CATHETERIZATION N/A 01/22/2015   Procedure: Coronary Stent Intervention;  Surgeon: Marcina Millard, MD;  Location: [facility] INVASIVE CV LAB;  Service: Cardiovascular;  Laterality: N/A;   CATARACT EXTRACTION, BILATERAL     COLONOSCOPY WITH PROPOFOL N/A 12/08/2015   Procedure: COLONOSCOPY WITH PROPOFOL;  Surgeon: Christena Deem, MD;  Location: [facility] ENDOSCOPY;  Service: Endoscopy;  Laterality: N/A;   COLONOSCOPY WITH PROPOFOL N/A 12/09/2015   Procedure: COLONOSCOPY WITH PROPOFOL;  Surgeon: Christena Deem, MD;  Location: [facility] ENDOSCOPY;  Service: Endoscopy;  Laterality: N/A;   CORONARY ANGIOPLASTY     5 stents   CORONARY STENT PLACEMENT  2015   x5   CYSTOSCOPY N/A 09/11/2018   Procedure: CYSTOSCOPY;  Surgeon: Vanna Scotland, MD;  Location: [facility] ORS;  Service: Urology;  Laterality: N/A;   CYSTOSCOPY WITH BIOPSY N/A 12/21/2017   Procedure: CYSTOSCOPY WITH BIOPSY;  Surgeon: Vanna Scotland, MD;  Location: [facility] ORS;  Service: Urology;  Laterality: N/A;   ESOPHAGOGASTRODUODENOSCOPY (EGD) WITH PROPOFOL N/A 12/08/2015   Procedure: ESOPHAGOGASTRODUODENOSCOPY (EGD) WITH PROPOFOL;  Surgeon: Christena Deem, MD;  Location: [facility]  ENDOSCOPY;  Service: Endoscopy;  Laterality: N/A;   ESOPHAGOGASTRODUODENOSCOPY (EGD) WITH PROPOFOL N/A 12/13/2017   Procedure: ESOPHAGOGASTRODUODENOSCOPY (EGD) WITH PROPOFOL;  Surgeon: Christena Deem, MD;  Location: [facility] ENDOSCOPY;  Service: Endoscopy;  Laterality: N/A;   EYE SURGERY     HERNIA REPAIR     kidney tumor remove     TRANSURETHRAL RESECTION OF BLADDER TUMOR WITH GYRUS (TURBT-GYRUS)  12/2013   UMBILICAL HERNIA REPAIR     urethral meatotomy     Patient Active Problem List   Diagnosis Date Noted   Class 2 obesity due to excess calories with body mass index (BMI) of 36.0 to 36.9 in adult 04/11/2020   Swelling of limb 03/28/2020   Lymphedema 03/28/2020   Trigeminal neuralgia 12/25/2019   Lower limb ulcer, calf, left, limited to breakdown of skin (HCC) 12/25/2019   OSA (obstructive sleep apnea) 01/23/2019   Acute systolic CHF (congestive heart failure) (HCC) 12/12/2018   Bruising 07/10/2018   Diet-controlled type 2 diabetes mellitus (HCC) 03/24/2018   Microalbuminuria 03/24/2018   Dizziness 11/17/2017   Simple chronic bronchitis (HCC) 09/22/2017   SOBOE (shortness of breath on exertion) 02/18/2017   Chronic midline low back pain without sciatica 12/29/2016   Periodic limb movement disorder 12/29/2016   Benign essential tremor 06/22/2016   Pure hypercholesterolemia 06/20/2015   Erectile dysfunction due to arterial insufficiency 06/16/2015   Unstable angina (HCC) 01/22/2015   Abdominal aortic 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 rupture (HCC) 12/31/2014   Benign prostatic hyperplasia without urinary obstruction 07/31/2014   Enlarged prostate 07/31/2014   Benign essential HTN 06/11/2014   Carotid artery narrowing 02/08/2014   Carotid artery obstruction 02/08/2014   Bilateral carotid artery stenosis 02/08/2014   Chest pain 08/20/2013   Bilateral cataracts 05/16/2013   Cataract 05/16/2013   Clinical depression 12/12/2012   Diabetes mellitus, type 2 (HCC) 12/12/2012   Combined fat  and carbohydrate induced hyperlipemia 12/12/2012   Major depressive disorder, single episode, unspecified 12/12/2012   Acid reflux 11/09/2012   Presence of stent in coronary artery 11/09/2012   BP (high blood pressure) 11/09/2012   Healed myocardial infarct 11/09/2012   Gastro-esophageal reflux disease without esophagitis 11/09/2012   History of cardiovascular surgery 11/09/2012   Fothergill's neuralgia 08/07/2012   Swelling of testicle 04/06/2012   Disorder of male genital organ 04/06/2012   Swelling of the testicles 04/06/2012   Arteriosclerosis of coronary artery 11/16/2011   CAD in native artery 11/16/2011   Fatigue 06/04/2011   Avitaminosis D 11/30/2010    ONSET DATE: 03/17/2022- Worsening symptoms  REFERRING DIAG:  G20.A1 (ICD-10-CM) - Parkinsons  R26.2 (ICD-10-CM) - Difficulty walking    THERAPY DIAG:  Muscle weakness (generalized)  Difficulty in walking, not elsewhere classified  Other abnormalities of gait  and mobility  Abnormality of gait and mobility  Unsteadiness on feet  Other lack of coordination  Chronic bilateral low back pain without sciatica  Rationale for Evaluation and Treatment: Rehabilitation  SUBJECTIVE:                                                                                                                                                                                             SUBJECTIVE STATEMENT:   Patient reports intermittent right hip pain. It bothered me all day yesterday but okay today.    Pt accompanied by: significant other  PERTINENT HISTORY: Patient is a 78 year old male with referral to outpatient PT for Parkinsons and difficulty walking. He was recently seen by Neurology worsening symptoms of parkinson- difficulty with walking. He has past medical history of Arthritis, Bladder cancer, and Trigeminal Neuralgia.  PAIN:  Are you having pain? Yes 4/10 low back   PRECAUTIONS: Fall  WEIGHT BEARING RESTRICTIONS:  No  FALLS: Has patient fallen in last 6 months? Yes. Number of falls 1  LIVING ENVIRONMENT: Lives with: lives with their spouse Lives in: House/apartment Stairs: Yes: External: 2 steps; has grab bar Has following equipment at home: Dan Humphreys - 2 wheeled, Environmental consultant - 4 wheeled, and Grab bars  PLOF: Independent with household mobility with device, Independent with community mobility with device, Independent with transfers, and Requires assistive device for independence  PATIENT GOALS: Get to where I can walk better again and get my legs stronger.   OBJECTIVE:    TODAY'S TREATMENT:                                                                                                                              DATE: 03/30/23    Physical therapy treatment session today consisted of completing assessment of goals and administration of testing as demonstrated and documented in flow sheet, treatment, and goals section of this note. Addition treatments may be found below.    THEREX [facility]-  Knee ext - Level 2 - 2 sets x 10 reps Ham curl- Level 2 - 2  sets x 10 reps Scap retraction Level 3- 2 sets of 10 reps Tricep ext press down- Single UE- level 3 - 2 sets of 10 reps  VC for proper use of equipment and VC/tactile cues for correct performance ensuring safety with all equipment today.    PATIENT EDUCATION: Education details: Exercise technique Person educated: Patient Education method: Explanation, Demonstration, Tactile cues, Verbal cues, and Handouts Education comprehension: verbalized understanding, returned demonstration, verbal cues required, tactile cues required, and needs further education  HOME EXERCISE PROGRAM: Access Code: [email or web address] URL: [email or web address] Date: 08/11/2022 Prepared by: Maureen Ralphs  Exercises - Seated Lumbar Flexion Stretch  - 7 x weekly - 3 sets - 30 sec hold - Seated Thoracic Flexion and Rotation with Arms Crossed  - 1 x daily - 7 x weekly  - 3 sets - 30 sec hold - Seated Figure 4 Piriformis Stretch  - 1 x daily - 7 x weekly - 3 sets - 10 reps  GOALS: Goals reviewed with patient? Yes  SHORT TERM GOALS: Target date: 09/20/2022  Pt will be independent with a comprehensive HEP in order to improve strength and balance in order to decrease fall risk and improve function at home and work.  Baseline: EVAL: Patient admits not performing much exercises - not moving around well. 10/25/2022-Patient reports not as compliant with previous HEP as instructed- Verbally reviewed Low back, cervical, thoracic stretching and importance of daily walking. 11/03/2022= Patient reports independent with cervical Stretches and some walking at home Goal status: MET   LONG TERM GOALS: Target date: 04/25/2023  Pt will improve FOTO to target score of 45 % to display perceived improvements in ability to complete ADL's.  Baseline: EVAL: 42: 11/03/2022= 55 Goal status: MET  2.  Pt will decrease 5TSTS by at least 5 seconds in order to demonstrate clinically significant improvement in LE strength.  Baseline: EVAL = 17.39 sec without UE support; 10/25/2022= 12.91 sec 12/2: 12.02 sec Goal status: MET  3. Pt will increase by at least 0.15 m/s in order to demonstrate clinically significant improvement in community ambulation   Baseline: EVAL= 0.67 m/s; 10/25/2022= 0.77 m/s 12/2: 0.84 m/s with 4WW Goal status: MET  4.  Pt will improve BERG by at least 3 points in order to demonstrate clinically significant improvement in balance.   Baseline: EVAL: to be assessed next visit: 08/23/2022= 36/56; 11/03/2022= 40/56 Goal status: MET  5.  Pt will decrease TUG to below 14 seconds/decrease in order to demonstrate decreased fall risk. Baseline: EVAL: 22.00 sec with upright 4WW; 10/25/2022= 15.98 sec with 4WW 12/2: 16.40 sec; 03/29/2023=15.80 with upright 4WW Goal status: PROGRESSING  6.  Pt will increase by at least 110m (173ft) in order to demonstrate clinically  significant improvement in cardiopulmonary endurance and community ambulation  Baseline: EVAL: 3:45 min (450 feet with upright 4WW) 10/25/2022= 830 feet with 4WW Goal status: MET  7.   Patient will report returning to walking in to community places using most appropriate assistive device vs current use of scooter for improved abilities with mobility during community outings Baseline: EVAL: Current using scooter for many social/community outings; 10/25/2022- Paitent continues to scooter for long distance community distances- due to some fatigue or SOB.  12/2: Only uses scooter when he has to ge tin the yard, does not use AD in home or garage majority of the time. Goal status: PROGRESSING  8.  Pt will increase > 1000 feet in order to demonstrate clinically significant improvement in cardiopulmonary endurance  and community ambulation   Baseline: 10/25/2022= 830 feet with 4WW 12/2: 919 feet with 4WW;  03/30/2023= 988 feet with upright 4WW  Goal status= PROGRESSING  9.  Pt will improve BERG by at least 3 points in order to demonstrate clinically significant improvement in balance.   Baseline: 11/03/2022= 40/56 12/2: 44/56 Goal status: MET    10. Pt will transition into gym-based exercise routine upon discharge in order to maintain progress of strength and balance  Baseline: 01/31/2023: Future interventions will involve therex in [facility] to prescribe pt proper exercise routine upon discharge; 03/29/2023= Patient able to perform some gym based exercises today but limited secondary to some Right hip pain.  Goal Status: ONGOING  ASSESSMENT:  CLINICAL IMPRESSION:   Patient presents with good motivation for today's progress visit. He improved with 6 min walk and TUG indicating improved step length. He was safe with all mobility. He was able to progress to some gym based exercises but limited some by right hip pain.  Difficulty getting on/off some of LE equipment due to right hip pain. Patient's  condition has the potential to improve in response to therapy. Maximum improvement is yet to be obtained. The anticipated improvement is attainable and reasonable in a generally predictable time.  Patient reports   Patient will continue to benefit from skilled PT services to address deficits and impairment identified in evaluation in order to maximize independence and safety in basic mobility required for performance of ADL, IADL, and leisure.    OBJECTIVE IMPAIRMENTS: Abnormal gait, cardiopulmonary status limiting activity, decreased activity tolerance, decreased balance, decreased coordination, decreased endurance, decreased mobility, difficulty walking, decreased ROM, decreased strength, hypomobility, impaired flexibility, impaired sensation, impaired UE functional use, postural dysfunction, obesity, and pain.   ACTIVITY LIMITATIONS: carrying, lifting, bending, standing, squatting, stairs, transfers, continence, and reach over head  PARTICIPATION LIMITATIONS: meal prep, cleaning, laundry, driving, shopping, community activity, and yard work  PERSONAL FACTORS: Age and 3+ comorbidities: HTN, Arthritis, Low back pain, Trigeminal Neuralgia  are also affecting patient's functional outcome.   REHAB POTENTIAL: Good  CLINICAL DECISION MAKING: Evolving/moderate complexity  EVALUATION COMPLEXITY: Moderate  PLAN:  PT FREQUENCY: 1-2x/week  PT DURATION: 12 weeks  PLANNED INTERVENTIONS: Therapeutic exercises, Therapeutic activity, Neuromuscular re-education, Balance training, Gait training, Patient/Family education, Self Care, Joint mobilization, Stair training, Vestibular training, Canalith repositioning, DME instructions, Dry Needling, Electrical stimulation, Spinal manipulation, Spinal mobilization, Cryotherapy, Moist heat, Manual therapy, and Re-evaluation  PLAN FOR NEXT SESSION: Progress therex and balance training. Continue with  training in [facility] for UE/LE strengthening and cardiovascular  training.    2:26 PM, 03/30/23 Louis Meckel, PT Physical Therapist - [facility]  [phone] [facility])    2:26 PM, 03/30/23

## 2023-04-04 ENCOUNTER — Ambulatory Visit: Payer: Medicare Other | Attending: Neurology

## 2023-04-04 DIAGNOSIS — R278 Other lack of coordination: Secondary | ICD-10-CM | POA: Diagnosis present

## 2023-04-04 DIAGNOSIS — R2681 Unsteadiness on feet: Secondary | ICD-10-CM | POA: Diagnosis present

## 2023-04-04 DIAGNOSIS — R269 Unspecified abnormalities of gait and mobility: Secondary | ICD-10-CM | POA: Insufficient documentation

## 2023-04-04 DIAGNOSIS — M545 Low back pain, unspecified: Secondary | ICD-10-CM | POA: Insufficient documentation

## 2023-04-04 DIAGNOSIS — G8929 Other chronic pain: Secondary | ICD-10-CM | POA: Insufficient documentation

## 2023-04-04 DIAGNOSIS — M6281 Muscle weakness (generalized): Secondary | ICD-10-CM | POA: Insufficient documentation

## 2023-04-04 DIAGNOSIS — R262 Difficulty in walking, not elsewhere classified: Secondary | ICD-10-CM | POA: Insufficient documentation

## 2023-04-04 DIAGNOSIS — R2689 Other abnormalities of gait and mobility: Secondary | ICD-10-CM | POA: Diagnosis present

## 2023-04-04 NOTE — Therapy (Deleted)
OUTPATIENT PHYSICAL THERAPY TREATMENT  Patient Name: [name] MRN: [id number] DOB:[birth year], 78 y.o., male Today's Date: 04/04/2023   PCP: Ardyth Man, PA- C REFERRING PROVIDER: Morene Crocker, MD  END OF SESSION:         Past Medical History:  Diagnosis Date   Anxiety    Arteriosclerosis of coronary artery 11/16/2011   Overview:  Stent 10/2011 stent rca 2015 with collaterals to lad which is chronically occluded    Benign enlargement of prostate    Benign essential HTN 06/11/2014   Benign prostatic hypertrophy without urinary obstruction 07/31/2014   Bilateral cataracts 05/16/2013   Overview:  [facility]     Bone spur of foot    Left   BP (high blood pressure) 11/09/2012   Cancer (HCC)    skin (forehead) and bladder   Carotid artery narrowing 02/08/2014   Depression    Detrusor hypertrophy    Diabetes (HCC)    Diabetes mellitus, type 2 (HCC) 12/12/2012   Diverticulosis    Dyspnea    Esophageal reflux    Esophageal reflux    Fothergill's neuralgia 08/07/2012   Overview:  [facility]    Gastritis    GERD (gastroesophageal reflux disease)    Headache    cluster headaches   Healed myocardial infarct 11/09/2012   Hearing loss in left ear    Heart disease    Hematuria    Hemorrhoids    History of hiatal hernia 12/14/2017   small    Hypercholesteremia    Lesion of bladder    Myocardial infarct (HCC)    Presence of stent in coronary artery 11/09/2012   Rectal bleeding    Trigeminal neuralgia    Trigeminal neuralgia    Valvular heart disease    Vitamin D deficiency    Past Surgical History:  Procedure Laterality Date   APPENDECTOMY     BOTOX INJECTION N/A 12/21/2017   Procedure: Bladder BOTOX INJECTION;  Surgeon: Vanna Scotland, MD;  Location: [facility];  Service: Urology;  Laterality: N/A;   BOTOX INJECTION N/A 09/11/2018   Procedure: Bladder BOTOX INJECTION;  Surgeon: Vanna Scotland, MD;  Location: [facility];  Service: Urology;   Laterality: N/A;   CARDIAC CATHETERIZATION     CARDIAC CATHETERIZATION N/A 01/22/2015   Procedure: Left Heart Cath;  Surgeon: Lamar Blinks, MD;  Location: [facility];  Service: Cardiovascular;  Laterality: N/A;   CARDIAC CATHETERIZATION N/A 01/22/2015   Procedure: Coronary Stent Intervention;  Surgeon: Marcina Millard, MD;  Location: [facility];  Service: Cardiovascular;  Laterality: N/A;   CATARACT EXTRACTION, BILATERAL     COLONOSCOPY WITH PROPOFOL N/A 12/08/2015   Procedure: COLONOSCOPY WITH PROPOFOL;  Surgeon: Christena Deem, MD;  Location: [facility] ENDOSCOPY;  Service: Endoscopy;  Laterality: N/A;   COLONOSCOPY WITH PROPOFOL N/A 12/09/2015   Procedure: COLONOSCOPY WITH PROPOFOL;  Surgeon: Christena Deem, MD;  Location: [facility] ENDOSCOPY;  Service: Endoscopy;  Laterality: N/A;   CORONARY ANGIOPLASTY     5 stents   CORONARY STENT PLACEMENT  2015   x5   CYSTOSCOPY N/A 09/11/2018   Procedure: CYSTOSCOPY;  Surgeon: Vanna Scotland, MD;  Location: [facility];  Service: Urology;  Laterality: N/A;   CYSTOSCOPY WITH BIOPSY N/A 12/21/2017   Procedure: CYSTOSCOPY WITH BIOPSY;  Surgeon: Vanna Scotland, MD;  Location: [facility];  Service: Urology;  Laterality: N/A;   ESOPHAGOGASTRODUODENOSCOPY (EGD) WITH PROPOFOL N/A 12/08/2015   Procedure: ESOPHAGOGASTRODUODENOSCOPY (EGD) WITH PROPOFOL;  Surgeon: Daphine Deutscher  Cyndia Skeeters, MD;  Location: [facility] ENDOSCOPY;  Service: Endoscopy;  Laterality: N/A;   ESOPHAGOGASTRODUODENOSCOPY (EGD) WITH PROPOFOL N/A 12/13/2017   Procedure: ESOPHAGOGASTRODUODENOSCOPY (EGD) WITH PROPOFOL;  Surgeon: Christena Deem, MD;  Location: [facility] ENDOSCOPY;  Service: Endoscopy;  Laterality: N/A;   EYE SURGERY     HERNIA REPAIR     kidney tumor remove     TRANSURETHRAL RESECTION OF BLADDER TUMOR WITH GYRUS (TURBT-GYRUS)  12/2013   UMBILICAL HERNIA REPAIR     urethral meatotomy     Patient Active Problem List   Diagnosis Date Noted   Class 2 obesity due to excess  calories with body mass index (BMI) of 36.0 to 36.9 in adult 04/11/2020   Swelling of limb 03/28/2020   Lymphedema 03/28/2020   Trigeminal neuralgia 12/25/2019   Lower limb ulcer, calf, left, limited to breakdown of skin (HCC) 12/25/2019   OSA (obstructive sleep apnea) 01/23/2019   Acute systolic CHF (congestive heart failure) (HCC) 12/12/2018   Bruising 07/10/2018   Diet-controlled type 2 diabetes mellitus (HCC) 03/24/2018   Microalbuminuria 03/24/2018   Dizziness 11/17/2017   Simple chronic bronchitis (HCC) 09/22/2017   SOBOE (shortness of breath on exertion) 02/18/2017   Chronic midline low back pain without sciatica 12/29/2016   Periodic limb movement disorder 12/29/2016   Benign essential tremor 06/22/2016   Pure hypercholesterolemia 06/20/2015   Erectile dysfunction due to arterial insufficiency 06/16/2015   Unstable angina (HCC) 01/22/2015   Abdominal aortic 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 rupture (HCC) 12/31/2014   Benign prostatic hyperplasia without urinary obstruction 07/31/2014   Enlarged prostate 07/31/2014   Benign essential HTN 06/11/2014   Carotid artery narrowing 02/08/2014   Carotid artery obstruction 02/08/2014   Bilateral carotid artery stenosis 02/08/2014   Chest pain 08/20/2013   Bilateral cataracts 05/16/2013   Cataract 05/16/2013   Clinical depression 12/12/2012   Diabetes mellitus, type 2 (HCC) 12/12/2012   Combined fat and carbohydrate induced hyperlipemia 12/12/2012   Major depressive disorder, single episode, unspecified 12/12/2012   Acid reflux 11/09/2012   Presence of stent in coronary artery 11/09/2012   BP (high blood pressure) 11/09/2012   Healed myocardial infarct 11/09/2012   Gastro-esophageal reflux disease without esophagitis 11/09/2012   History of cardiovascular surgery 11/09/2012   Fothergill's neuralgia 08/07/2012   Swelling of testicle 04/06/2012   Disorder of male genital organ 04/06/2012   Swelling of the testicles 04/06/2012    Arteriosclerosis of coronary artery 11/16/2011   CAD in native artery 11/16/2011   Fatigue 06/04/2011   Avitaminosis D 11/30/2010    ONSET DATE: 03/17/2022- Worsening symptoms  REFERRING DIAG:  G20.A1 (ICD-10-CM) - Parkinsons  R26.2 (ICD-10-CM) - Difficulty walking    THERAPY DIAG:  No diagnosis found.  Rationale for Evaluation and Treatment: Rehabilitation  SUBJECTIVE:  SUBJECTIVE STATEMENT:   *** Patient reports intermittent right hip pain. It bothered me all day yesterday but okay today.    Pt accompanied by: significant other  PERTINENT HISTORY: Patient is a 78 year old male with referral to outpatient PT for Parkinsons and difficulty walking. He was recently seen by Neurology worsening symptoms of parkinson- difficulty with walking. He has past medical history of Arthritis, Bladder cancer, and Trigeminal Neuralgia.  PAIN:  Are you having pain? Yes 4/10 low back   PRECAUTIONS: Fall  WEIGHT BEARING RESTRICTIONS: No  FALLS: Has patient fallen in last 6 months? Yes. Number of falls 1  LIVING ENVIRONMENT: Lives with: lives with their spouse Lives in: House/apartment Stairs: Yes: External: 2 steps; has grab bar Has following equipment at home: Dan Humphreys - 2 wheeled, Environmental consultant - 4 wheeled, and Grab bars  PLOF: Independent with household mobility with device, Independent with community mobility with device, Independent with transfers, and Requires assistive device for independence  PATIENT GOALS: Get to where I can walk better again and get my legs stronger.   OBJECTIVE:    TODAY'S TREATMENT:                                                                                                                              DATE: 04/04/23        THEREX [facility]-  Knee ext - Level 2 - 2 sets x  10 reps Ham curl- Level 2 - 2 sets x 10 reps Scap retraction Level 3- 2 sets of 10 reps Tricep ext press down- Single UE- level 3 - 2 sets of 10 reps  VC for proper use of equipment and VC/tactile cues for correct performance ensuring safety with all equipment today.    PATIENT EDUCATION: Education details: Exercise technique Person educated: Patient Education method: Explanation, Demonstration, Tactile cues, Verbal cues, and Handouts Education comprehension: verbalized understanding, returned demonstration, verbal cues required, tactile cues required, and needs further education  HOME EXERCISE PROGRAM: Access Code: [email or web address] URL: [email or web address] Date: 08/11/2022 Prepared by: Maureen Ralphs  Exercises - Seated Lumbar Flexion Stretch  - 7 x weekly - 3 sets - 30 sec hold - Seated Thoracic Flexion and Rotation with Arms Crossed  - 1 x daily - 7 x weekly - 3 sets - 30 sec hold - Seated Figure 4 Piriformis Stretch  - 1 x daily - 7 x weekly - 3 sets - 10 reps  GOALS: Goals reviewed with patient? Yes  SHORT TERM GOALS: Target date: 09/20/2022  Pt will be independent with a comprehensive HEP in order to improve strength and balance in order to decrease fall risk and improve function at home and work.  Baseline: EVAL: Patient admits not performing much exercises - not moving around well. 10/25/2022-Patient reports not as compliant with previous HEP as instructed- Verbally reviewed Low back, cervical, thoracic stretching and importance of daily walking. 11/03/2022= Patient reports independent with cervical Stretches and some walking at home  Goal status: MET   LONG TERM GOALS: Target date: 04/25/2023  Pt will improve FOTO to target score of 45 % to display perceived improvements in ability to complete ADL's.  Baseline: EVAL: 42: 11/03/2022= 55 Goal status: MET  2.  Pt will decrease 5TSTS by at least 5 seconds in order to demonstrate clinically significant improvement in LE  strength.  Baseline: EVAL = 17.39 sec without UE support; 10/25/2022= 12.91 sec 12/2: 12.02 sec Goal status: MET  3. Pt will increase by at least 0.15 m/s in order to demonstrate clinically significant improvement in community ambulation   Baseline: EVAL= 0.67 m/s; 10/25/2022= 0.77 m/s 12/2: 0.84 m/s with 4WW Goal status: MET  4.  Pt will improve BERG by at least 3 points in order to demonstrate clinically significant improvement in balance.   Baseline: EVAL: to be assessed next visit: 08/23/2022= 36/56; 11/03/2022= 40/56 Goal status: MET  5.  Pt will decrease TUG to below 14 seconds/decrease in order to demonstrate decreased fall risk. Baseline: EVAL: 22.00 sec with upright 4WW; 10/25/2022= 15.98 sec with 4WW 12/2: 16.40 sec; 03/29/2023=15.80 with upright 4WW Goal status: PROGRESSING  6.  Pt will increase by at least 98m (155ft) in order to demonstrate clinically significant improvement in cardiopulmonary endurance and community ambulation  Baseline: EVAL: 3:45 min (450 feet with upright 4WW) 10/25/2022= 830 feet with 4WW Goal status: MET  7.   Patient will report returning to walking in to community places using most appropriate assistive device vs current use of scooter for improved abilities with mobility during community outings Baseline: EVAL: Current using scooter for many social/community outings; 10/25/2022- Paitent continues to scooter for long distance community distances- due to some fatigue or SOB.  12/2: Only uses scooter when he has to ge tin the yard, does not use AD in home or garage majority of the time. Goal status: PROGRESSING  8.  Pt will increase > 1000 feet in order to demonstrate clinically significant improvement in cardiopulmonary endurance and community ambulation   Baseline: 10/25/2022= 830 feet with 4WW 12/2: 919 feet with 4WW;  03/30/2023= 988 feet with upright 4WW  Goal status= PROGRESSING  9.  Pt will improve BERG by at least 3 points in order to  demonstrate clinically significant improvement in balance.   Baseline: 11/03/2022= 40/56 12/2: 44/56 Goal status: MET    10. Pt will transition into gym-based exercise routine upon discharge in order to maintain progress of strength and balance  Baseline: 01/31/2023: Future interventions will involve therex in [facility] to prescribe pt proper exercise routine upon discharge; 03/29/2023= Patient able to perform some gym based exercises today but limited secondary to some Right hip pain.  Goal Status: ONGOING  ASSESSMENT:  CLINICAL IMPRESSION:   Patient presents with good motivation for today's progress visit. He improved with 6 min walk and TUG indicating improved step length. He was safe with all mobility. He was able to progress to some gym based exercises but limited some by right hip pain.  Difficulty getting on/off some of LE equipment due to right hip pain. Patient's condition has the potential to improve in response to therapy. Maximum improvement is yet to be obtained. The anticipated improvement is attainable and reasonable in a generally predictable time.  Patient reports   Patient will continue to benefit from skilled PT services to address deficits and impairment identified in evaluation in order to maximize independence and safety in basic mobility required for performance of ADL, IADL, and leisure.  OBJECTIVE IMPAIRMENTS: Abnormal gait, cardiopulmonary status limiting activity, decreased activity tolerance, decreased balance, decreased coordination, decreased endurance, decreased mobility, difficulty walking, decreased ROM, decreased strength, hypomobility, impaired flexibility, impaired sensation, impaired UE functional use, postural dysfunction, obesity, and pain.   ACTIVITY LIMITATIONS: carrying, lifting, bending, standing, squatting, stairs, transfers, continence, and reach over head  PARTICIPATION LIMITATIONS: meal prep, cleaning, laundry, driving, shopping, community activity,  and yard work  PERSONAL FACTORS: Age and 3+ comorbidities: HTN, Arthritis, Low back pain, Trigeminal Neuralgia  are also affecting patient's functional outcome.   REHAB POTENTIAL: Good  CLINICAL DECISION MAKING: Evolving/moderate complexity  EVALUATION COMPLEXITY: Moderate  PLAN:  PT FREQUENCY: 1-2x/week  PT DURATION: 12 weeks  PLANNED INTERVENTIONS: Therapeutic exercises, Therapeutic activity, Neuromuscular re-education, Balance training, Gait training, Patient/Family education, Self Care, Joint mobilization, Stair training, Vestibular training, Canalith repositioning, DME instructions, Dry Needling, Electrical stimulation, Spinal manipulation, Spinal mobilization, Cryotherapy, Moist heat, Manual therapy, and Re-evaluation  PLAN FOR NEXT SESSION: Progress therex and balance training. Continue with  training in [facility] for UE/LE strengthening and cardiovascular training.    9:39 AM, 04/04/23 Louis Meckel, PT Physical Therapist - [facility]  [phone] [facility])    9:39 AM, 04/04/23

## 2023-04-04 NOTE — Therapy (Signed)
OUTPATIENT PHYSICAL THERAPY TREATMENT   Patient Name: [name] MRN: [id number] DOB:Dec 30, 1945, 78 y.o., male Today's Date: 04/04/2023   PCP: Ardyth Man, PA- C REFERRING PROVIDER: Morene Crocker, MD  END OF SESSION:  PT End of Session - 04/04/23 1328     Visit Number 51    Number of Visits 63    Date for PT Re-Evaluation 04/25/23    Authorization Type [employer]    Progress Note Due on Visit 50    PT Start Time 1105    PT Stop Time 1145    PT Time Calculation (min) 40 min    Equipment Utilized During Treatment Gait belt    Activity Tolerance Patient tolerated treatment well;Patient limited by fatigue    Behavior During Therapy [facility] for tasks assessed/performed                   Past Medical History:  Diagnosis Date   Anxiety    Arteriosclerosis of coronary artery 11/16/2011   Overview:  Stent 10/2011 stent rca 2015 with collaterals to lad which is chronically occluded    Benign enlargement of prostate    Benign essential HTN 06/11/2014   Benign prostatic hypertrophy without urinary obstruction 07/31/2014   Bilateral cataracts 05/16/2013   Overview:  [facility]     Bone spur of foot    Left   BP (high blood pressure) 11/09/2012   Cancer (HCC)    skin (forehead) and bladder   Carotid artery narrowing 02/08/2014   Depression    Detrusor hypertrophy    Diabetes (HCC)    Diabetes mellitus, type 2 (HCC) 12/12/2012   Diverticulosis    Dyspnea    Esophageal reflux    Esophageal reflux    Fothergill's neuralgia 08/07/2012   Overview:  [facility]    Gastritis    GERD (gastroesophageal reflux disease)    Headache    cluster headaches   Healed myocardial infarct 11/09/2012   Hearing loss in left ear    Heart disease    Hematuria    Hemorrhoids    History of hiatal hernia 12/14/2017   small    Hypercholesteremia    Lesion of bladder    Myocardial infarct (HCC)    Presence of stent in coronary artery 11/09/2012   Rectal bleeding     Trigeminal neuralgia    Trigeminal neuralgia    Valvular heart disease    Vitamin D deficiency    Past Surgical History:  Procedure Laterality Date   APPENDECTOMY     BOTOX INJECTION N/A 12/21/2017   Procedure: Bladder BOTOX INJECTION;  Surgeon: Vanna Scotland, MD;  Location: [facility] ORS;  Service: Urology;  Laterality: N/A;   BOTOX INJECTION N/A 09/11/2018   Procedure: Bladder BOTOX INJECTION;  Surgeon: Vanna Scotland, MD;  Location: [facility] ORS;  Service: Urology;  Laterality: N/A;   CARDIAC CATHETERIZATION     CARDIAC CATHETERIZATION N/A 01/22/2015   Procedure: Left Heart Cath;  Surgeon: Lamar Blinks, MD;  Location: [facility] INVASIVE CV LAB;  Service: Cardiovascular;  Laterality: N/A;   CARDIAC CATHETERIZATION N/A 01/22/2015   Procedure: Coronary Stent Intervention;  Surgeon: Marcina Millard, MD;  Location: [facility] INVASIVE CV LAB;  Service: Cardiovascular;  Laterality: N/A;   CATARACT EXTRACTION, BILATERAL     COLONOSCOPY WITH PROPOFOL N/A 12/08/2015   Procedure: COLONOSCOPY WITH PROPOFOL;  Surgeon: Christena Deem, MD;  Location: [facility] ENDOSCOPY;  Service: Endoscopy;  Laterality: N/A;   COLONOSCOPY WITH PROPOFOL N/A 12/09/2015  Procedure: COLONOSCOPY WITH PROPOFOL;  Surgeon: Christena Deem, MD;  Location: [facility] ENDOSCOPY;  Service: Endoscopy;  Laterality: N/A;   CORONARY ANGIOPLASTY     5 stents   CORONARY STENT PLACEMENT  2015   x5   CYSTOSCOPY N/A 09/11/2018   Procedure: CYSTOSCOPY;  Surgeon: Vanna Scotland, MD;  Location: [facility] ORS;  Service: Urology;  Laterality: N/A;   CYSTOSCOPY WITH BIOPSY N/A 12/21/2017   Procedure: CYSTOSCOPY WITH BIOPSY;  Surgeon: Vanna Scotland, MD;  Location: [facility] ORS;  Service: Urology;  Laterality: N/A;   ESOPHAGOGASTRODUODENOSCOPY (EGD) WITH PROPOFOL N/A 12/08/2015   Procedure: ESOPHAGOGASTRODUODENOSCOPY (EGD) WITH PROPOFOL;  Surgeon: Christena Deem, MD;  Location: [facility] ENDOSCOPY;  Service: Endoscopy;  Laterality: N/A;   ESOPHAGOGASTRODUODENOSCOPY  (EGD) WITH PROPOFOL N/A 12/13/2017   Procedure: ESOPHAGOGASTRODUODENOSCOPY (EGD) WITH PROPOFOL;  Surgeon: Christena Deem, MD;  Location: [facility] ENDOSCOPY;  Service: Endoscopy;  Laterality: N/A;   EYE SURGERY     HERNIA REPAIR     kidney tumor remove     TRANSURETHRAL RESECTION OF BLADDER TUMOR WITH GYRUS (TURBT-GYRUS)  12/2013   UMBILICAL HERNIA REPAIR     urethral meatotomy     Patient Active Problem List   Diagnosis Date Noted   Class 2 obesity due to excess calories with body mass index (BMI) of 36.0 to 36.9 in adult 04/11/2020   Swelling of limb 03/28/2020   Lymphedema 03/28/2020   Trigeminal neuralgia 12/25/2019   Lower limb ulcer, calf, left, limited to breakdown of skin (HCC) 12/25/2019   OSA (obstructive sleep apnea) 01/23/2019   Acute systolic CHF (congestive heart failure) (HCC) 12/12/2018   Bruising 07/10/2018   Diet-controlled type 2 diabetes mellitus (HCC) 03/24/2018   Microalbuminuria 03/24/2018   Dizziness 11/17/2017   Simple chronic bronchitis (HCC) 09/22/2017   SOBOE (shortness of breath on exertion) 02/18/2017   Chronic midline low back pain without sciatica 12/29/2016   Periodic limb movement disorder 12/29/2016   Benign essential tremor 06/22/2016   Pure hypercholesterolemia 06/20/2015   Erectile dysfunction due to arterial insufficiency 06/16/2015   Unstable angina (HCC) 01/22/2015   Abdominal aortic 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 rupture (HCC) 12/31/2014   Benign prostatic hyperplasia without urinary obstruction 07/31/2014   Enlarged prostate 07/31/2014   Benign essential HTN 06/11/2014   Carotid artery narrowing 02/08/2014   Carotid artery obstruction 02/08/2014   Bilateral carotid artery stenosis 02/08/2014   Chest pain 08/20/2013   Bilateral cataracts 05/16/2013   Cataract 05/16/2013   Clinical depression 12/12/2012   Diabetes mellitus, type 2 (HCC) 12/12/2012   Combined fat and carbohydrate induced hyperlipemia 12/12/2012   Major depressive disorder,  single episode, unspecified 12/12/2012   Acid reflux 11/09/2012   Presence of stent in coronary artery 11/09/2012   BP (high blood pressure) 11/09/2012   Healed myocardial infarct 11/09/2012   Gastro-esophageal reflux disease without esophagitis 11/09/2012   History of cardiovascular surgery 11/09/2012   Fothergill's neuralgia 08/07/2012   Swelling of testicle 04/06/2012   Disorder of male genital organ 04/06/2012   Swelling of the testicles 04/06/2012   Arteriosclerosis of coronary artery 11/16/2011   CAD in native artery 11/16/2011   Fatigue 06/04/2011   Avitaminosis D 11/30/2010    ONSET DATE: 03/17/2022- Worsening symptoms  REFERRING DIAG:  G20.A1 (ICD-10-CM) - Parkinsons  R26.2 (ICD-10-CM) - Difficulty walking    THERAPY DIAG:  Muscle weakness (generalized)  Difficulty in walking, not elsewhere classified  Unsteadiness on feet  Chronic bilateral low back pain without sciatica  Rationale for Evaluation and Treatment: Rehabilitation  SUBJECTIVE:                                                                                                                                                                                             SUBJECTIVE STATEMENT:   Pt reports mod R hip pain that comes and goes, can feel severe at times (ongoing issue). Did fine with his exercises last visit, did not increase his pain. Pt reports catch in R side neck from activity at home yesterday. Says it's a whole lot better. Reports no stumbles/falls. Will be getting another injection in his back. Last was a month ago.   Pt accompanied by: significant other  PERTINENT HISTORY: Patient is a 78 year old male with referral to outpatient PT for Parkinsons and difficulty walking. He was recently seen by Neurology worsening symptoms of parkinson- difficulty with walking. He has past medical history of Arthritis, Bladder cancer, and Trigeminal Neuralgia.  PAIN:  Are you having pain? Yes 4/10 low back    PRECAUTIONS: Fall  WEIGHT BEARING RESTRICTIONS: No  FALLS: Has patient fallen in last 6 months? Yes. Number of falls 1  LIVING ENVIRONMENT: Lives with: lives with their spouse Lives in: House/apartment Stairs: Yes: External: 2 steps; has grab bar Has following equipment at home: Dan Humphreys - 2 wheeled, Environmental consultant - 4 wheeled, and Grab bars  PLOF: Independent with household mobility with device, Independent with community mobility with device, Independent with transfers, and Requires assistive device for independence  PATIENT GOALS: Get to where I can walk better again and get my legs stronger.   OBJECTIVE:    TODAY'S TREATMENT:                                                                                                                              DATE: 04/04/23     TA: [facility]-  Knee ext - Level 2 - 2 sets x 10 reps Ham curl- Level 2 - 2 sets x 10 reps Scap retraction Level 3- 2 sets of 10 reps Tricep ext press down- Single UE- level 3 - 2  sets of 10 reps  VC for proper use of equipment and VC/tactile cues for correct performance ensuring safety with all equipment today.    Sit to stand x6, x4 reps - easy, but fatigued  Seated march 7x each LE - discontinued to to hip pain  Gait for endurance/speed and improved functional capacity x 148 ft with RW  PATIENT EDUCATION: Education details: Exercise technique Person educated: Patient Education method: Explanation, Demonstration, Tactile cues, and Verbal cues Education comprehension: verbalized understanding, returned demonstration, verbal cues required, tactile cues required, and needs further education  HOME EXERCISE PROGRAM: Access Code: [email or web address] URL: [email or web address] Date: 08/11/2022 Prepared by: Maureen Ralphs  Exercises - Seated Lumbar Flexion Stretch  - 7 x weekly - 3 sets - 30 sec hold - Seated Thoracic Flexion and Rotation with Arms Crossed  - 1 x daily - 7 x weekly - 3 sets - 30 sec hold -  Seated Figure 4 Piriformis Stretch  - 1 x daily - 7 x weekly - 3 sets - 10 reps  GOALS: Goals reviewed with patient? Yes  SHORT TERM GOALS: Target date: 09/20/2022  Pt will be independent with a comprehensive HEP in order to improve strength and balance in order to decrease fall risk and improve function at home and work.  Baseline: EVAL: Patient admits not performing much exercises - not moving around well. 10/25/2022-Patient reports not as compliant with previous HEP as instructed- Verbally reviewed Low back, cervical, thoracic stretching and importance of daily walking. 11/03/2022= Patient reports independent with cervical Stretches and some walking at home Goal status: MET   LONG TERM GOALS: Target date: 04/25/2023  Pt will improve FOTO to target score of 45 % to display perceived improvements in ability to complete ADL's.  Baseline: EVAL: 42: 11/03/2022= 55 Goal status: MET  2.  Pt will decrease 5TSTS by at least 5 seconds in order to demonstrate clinically significant improvement in LE strength.  Baseline: EVAL = 17.39 sec without UE support; 10/25/2022= 12.91 sec 12/2: 12.02 sec Goal status: MET  3. Pt will increase by at least 0.15 m/s in order to demonstrate clinically significant improvement in community ambulation   Baseline: EVAL= 0.67 m/s; 10/25/2022= 0.77 m/s 12/2: 0.84 m/s with 4WW Goal status: MET  4.  Pt will improve BERG by at least 3 points in order to demonstrate clinically significant improvement in balance.   Baseline: EVAL: to be assessed next visit: 08/23/2022= 36/56; 11/03/2022= 40/56 Goal status: MET  5.  Pt will decrease TUG to below 14 seconds/decrease in order to demonstrate decreased fall risk. Baseline: EVAL: 22.00 sec with upright 4WW; 10/25/2022= 15.98 sec with 4WW 12/2: 16.40 sec; 03/29/2023=15.80 with upright 4WW Goal status: PROGRESSING  6.  Pt will increase by at least 75m (172ft) in order to demonstrate clinically significant improvement in  cardiopulmonary endurance and community ambulation  Baseline: EVAL: 3:45 min (450 feet with upright 4WW) 10/25/2022= 830 feet with 4WW Goal status: MET  7.   Patient will report returning to walking in to community places using most appropriate assistive device vs current use of scooter for improved abilities with mobility during community outings Baseline: EVAL: Current using scooter for many social/community outings; 10/25/2022- Paitent continues to scooter for long distance community distances- due to some fatigue or SOB.  12/2: Only uses scooter when he has to ge tin the yard, does not use AD in home or garage majority of the time. Goal status: PROGRESSING  8.  Pt will increase >  1000 feet in order to demonstrate clinically significant improvement in cardiopulmonary endurance and community ambulation   Baseline: 10/25/2022= 830 feet with 4WW 12/2: 919 feet with 4WW;  03/30/2023= 988 feet with upright 4WW  Goal status= PROGRESSING  9.  Pt will improve BERG by at least 3 points in order to demonstrate clinically significant improvement in balance.   Baseline: 11/03/2022= 40/56 12/2: 44/56 Goal status: MET    10. Pt will transition into gym-based exercise routine upon discharge in order to maintain progress of strength and balance  Baseline: 01/31/2023: Future interventions will involve therex in [facility] to prescribe pt proper exercise routine upon discharge; 03/29/2023= Patient able to perform some gym based exercises today but limited secondary to some Right hip pain.  Goal Status: ONGOING  ASSESSMENT:  CLINICAL IMPRESSION:   PT continued plan as laid out in previous session. Pt with excellent motivation to participate and tolerated majority of interventions well without pain, but with some limitation due to fatigue. Rest breaks were provided throughout. Seated march was attempted but discontinued once pt reported slight pain increase. Patient will continue to benefit from skilled PT  services to address deficits and impairment identified in evaluation in order to maximize independence and safety in basic mobility required for performance of ADL, IADL, and leisure.    OBJECTIVE IMPAIRMENTS: Abnormal gait, cardiopulmonary status limiting activity, decreased activity tolerance, decreased balance, decreased coordination, decreased endurance, decreased mobility, difficulty walking, decreased ROM, decreased strength, hypomobility, impaired flexibility, impaired sensation, impaired UE functional use, postural dysfunction, obesity, and pain.   ACTIVITY LIMITATIONS: carrying, lifting, bending, standing, squatting, stairs, transfers, continence, and reach over head  PARTICIPATION LIMITATIONS: meal prep, cleaning, laundry, driving, shopping, community activity, and yard work  PERSONAL FACTORS: Age and 3+ comorbidities: HTN, Arthritis, Low back pain, Trigeminal Neuralgia  are also affecting patient's functional outcome.   REHAB POTENTIAL: Good  CLINICAL DECISION MAKING: Evolving/moderate complexity  EVALUATION COMPLEXITY: Moderate  PLAN:  PT FREQUENCY: 1-2x/week  PT DURATION: 12 weeks  PLANNED INTERVENTIONS: Therapeutic exercises, Therapeutic activity, Neuromuscular re-education, Balance training, Gait training, Patient/Family education, Self Care, Joint mobilization, Stair training, Vestibular training, Canalith repositioning, DME instructions, Dry Needling, Electrical stimulation, Spinal manipulation, Spinal mobilization, Cryotherapy, Moist heat, Manual therapy, and Re-evaluation  PLAN FOR NEXT SESSION: Progress therex and balance training. Continue with  training in [facility] for UE/LE strengthening and cardiovascular training.    1:32 PM, 04/04/23 Temple Pacini PT, DPT  Physical Therapist - [facility], [place]  [phone] (ASCOM)    1:32 PM, 04/04/23

## 2023-04-06 ENCOUNTER — Ambulatory Visit: Payer: Medicare Other

## 2023-04-06 DIAGNOSIS — M1611 Unilateral primary osteoarthritis, right hip: Secondary | ICD-10-CM | POA: Insufficient documentation

## 2023-04-08 ENCOUNTER — Ambulatory Visit: Payer: Medicare Other

## 2023-04-08 DIAGNOSIS — M6281 Muscle weakness (generalized): Secondary | ICD-10-CM

## 2023-04-08 NOTE — Therapy (Signed)
 OUTPATIENT PHYSICAL THERAPY TREATMENT   Patient Name: [name] MRN: [id number] DOB:[birth year], 78 y.o., male Today's Date: 04/11/2023   PCP: Damien Ryder, PA- C REFERRING PROVIDER: Lane Arthea BRAVO, MD  END OF SESSION:  PT End of Session - 04/11/23 0936     Visit Number 52    Number of Visits 63    Date for PT Re-Evaluation 04/25/23    Authorization Type [employer]    Progress Note Due on Visit 60    PT Start Time 0935    PT Stop Time 1015    PT Time Calculation (min) 40 min    Equipment Utilized During Treatment Gait belt    Activity Tolerance Patient tolerated treatment well;Patient limited by fatigue    Behavior During Therapy [facility] for tasks assessed/performed                     Past Medical History:  Diagnosis Date   Anxiety    Arteriosclerosis of coronary artery 11/16/2011   Overview:  Stent 10/2011 stent rca 2015 with collaterals to lad which is chronically occluded    Benign enlargement of prostate    Benign essential HTN 06/11/2014   Benign prostatic hypertrophy without urinary obstruction 07/31/2014   Bilateral cataracts 05/16/2013   Overview:  [facility]     Bone spur of foot    Left   BP (high blood pressure) 11/09/2012   Cancer (HCC)    skin (forehead) and bladder   Carotid artery narrowing 02/08/2014   Depression    Detrusor hypertrophy    Diabetes (HCC)    Diabetes mellitus, type 2 (HCC) 12/12/2012   Diverticulosis    Dyspnea    Esophageal reflux    Esophageal reflux    Fothergill's neuralgia 08/07/2012   Overview:  [facility]    Gastritis    GERD (gastroesophageal reflux disease)    Headache    cluster headaches   Healed myocardial infarct 11/09/2012   Hearing loss in left ear    Heart disease    Hematuria    Hemorrhoids    History of hiatal hernia 12/14/2017   small    Hypercholesteremia    Lesion of bladder    Myocardial infarct (HCC)    Presence of stent in coronary artery 11/09/2012   Rectal  bleeding    Trigeminal neuralgia    Trigeminal neuralgia    Valvular heart disease    Vitamin D deficiency    Past Surgical History:  Procedure Laterality Date   APPENDECTOMY     BOTOX  INJECTION N/A 12/21/2017   Procedure: Bladder BOTOX  INJECTION;  Surgeon: Penne Knee, MD;  Location: [facility] ORS;  Service: Urology;  Laterality: N/A;   BOTOX  INJECTION N/A 09/11/2018   Procedure: Bladder BOTOX  INJECTION;  Surgeon: Penne Knee, MD;  Location: [facility] ORS;  Service: Urology;  Laterality: N/A;   CARDIAC CATHETERIZATION     CARDIAC CATHETERIZATION N/A 01/22/2015   Procedure: Left Heart Cath;  Surgeon: Wolm JINNY Rhyme, MD;  Location: [facility] INVASIVE CV LAB;  Service: Cardiovascular;  Laterality: N/A;   CARDIAC CATHETERIZATION N/A 01/22/2015   Procedure: Coronary Stent Intervention;  Surgeon: Marsa Dooms, MD;  Location: [facility] INVASIVE CV LAB;  Service: Cardiovascular;  Laterality: N/A;   CATARACT EXTRACTION, BILATERAL     COLONOSCOPY WITH PROPOFOL  N/A 12/08/2015   Procedure: COLONOSCOPY WITH PROPOFOL ;  Surgeon: Gladis RAYMOND Mariner, MD;  Location[facility] ENDOSCOPY;  Service: Endoscopy;  Laterality: N/A;   COLONOSCOPY WITH PROPOFOL  N/A  12/09/2015   Procedure: COLONOSCOPY WITH PROPOFOL ;  Surgeon: Gladis RAYMOND Mariner, MD;  Location: [facility] ENDOSCOPY;  Service: Endoscopy;  Laterality: N/A;   CORONARY ANGIOPLASTY     5 stents   CORONARY STENT PLACEMENT  2015   x5   CYSTOSCOPY N/A 09/11/2018   Procedure: CYSTOSCOPY;  Surgeon: Penne Knee, MD;  Location: [facility] ORS;  Service: Urology;  Laterality: N/A;   CYSTOSCOPY WITH BIOPSY N/A 12/21/2017   Procedure: CYSTOSCOPY WITH BIOPSY;  Surgeon: Penne Knee, MD;  Location: [facility] ORS;  Service: Urology;  Laterality: N/A;   ESOPHAGOGASTRODUODENOSCOPY (EGD) WITH PROPOFOL  N/A 12/08/2015   Procedure: ESOPHAGOGASTRODUODENOSCOPY (EGD) WITH PROPOFOL ;  Surgeon: Gladis RAYMOND Mariner, MD;  Location: [facility] ENDOSCOPY;  Service: Endoscopy;  Laterality: N/A;    ESOPHAGOGASTRODUODENOSCOPY (EGD) WITH PROPOFOL  N/A 12/13/2017   Procedure: ESOPHAGOGASTRODUODENOSCOPY (EGD) WITH PROPOFOL ;  Surgeon: Mariner Gladis RAYMOND, MD;  Location: [facility] ENDOSCOPY;  Service: Endoscopy;  Laterality: N/A;   EYE SURGERY     HERNIA REPAIR     kidney tumor remove     TRANSURETHRAL RESECTION OF BLADDER TUMOR WITH GYRUS (TURBT-GYRUS)  12/2013   UMBILICAL HERNIA REPAIR     urethral meatotomy     Patient Active Problem List   Diagnosis Date Noted   Class 2 obesity due to excess calories with body mass index (BMI) of 36.0 to 36.9 in adult 04/11/2020   Swelling of limb 03/28/2020   Lymphedema 03/28/2020   Trigeminal neuralgia 12/25/2019   Lower limb ulcer, calf, left, limited to breakdown of skin (HCC) 12/25/2019   OSA (obstructive sleep apnea) 01/23/2019   Acute systolic CHF (congestive heart failure) (HCC) 12/12/2018   Bruising 07/10/2018   Diet-controlled type 2 diabetes mellitus (HCC) 03/24/2018   Microalbuminuria 03/24/2018   Dizziness 11/17/2017   Simple chronic bronchitis (HCC) 09/22/2017   SOBOE (shortness of breath on exertion) 02/18/2017   Chronic midline low back pain without sciatica 12/29/2016   Periodic limb movement disorder 12/29/2016   Benign essential tremor 06/22/2016   Pure hypercholesterolemia 06/20/2015   Erectile dysfunction due to arterial insufficiency 06/16/2015   Unstable angina (HCC) 01/22/2015   Abdominal aortic 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 rupture (HCC) 12/31/2014   Benign prostatic hyperplasia without urinary obstruction 07/31/2014   Enlarged prostate 07/31/2014   Benign essential HTN 06/11/2014   Carotid artery narrowing 02/08/2014   Carotid artery obstruction 02/08/2014   Bilateral carotid artery stenosis 02/08/2014   Chest pain 08/20/2013   Bilateral cataracts 05/16/2013   Cataract 05/16/2013   Clinical depression 12/12/2012   Diabetes mellitus, type 2 (HCC) 12/12/2012   Combined fat and carbohydrate induced hyperlipemia 12/12/2012    Major depressive disorder, single episode, unspecified 12/12/2012   Acid reflux 11/09/2012   Presence of stent in coronary artery 11/09/2012   BP (high blood pressure) 11/09/2012   Healed myocardial infarct 11/09/2012   Gastro-esophageal reflux disease without esophagitis 11/09/2012   History of cardiovascular surgery 11/09/2012   Fothergill's neuralgia 08/07/2012   Swelling of testicle 04/06/2012   Disorder of male genital organ 04/06/2012   Swelling of the testicles 04/06/2012   Arteriosclerosis of coronary artery 11/16/2011   CAD in native artery 11/16/2011   Fatigue 06/04/2011   Avitaminosis D 11/30/2010    ONSET DATE: 03/17/2022- Worsening symptoms  REFERRING DIAG:  G20.A1 (ICD-10-CM) - Parkinsons  R26.2 (ICD-10-CM) - Difficulty walking    THERAPY DIAG:  Muscle weakness (generalized)  Rationale for Evaluation and Treatment: Rehabilitation  SUBJECTIVE:  SUBJECTIVE STATEMENT:   Patient reports hip is doing okay.    Pt accompanied by: significant other  PERTINENT HISTORY: Patient is a 78 year old male with referral to outpatient PT for Parkinsons and difficulty walking. He was recently seen by Neurology worsening symptoms of parkinson- difficulty with walking. He has past medical history of Arthritis, Bladder cancer, and Trigeminal Neuralgia.  PAIN:  Are you having pain? Yes 4/10 low back   PRECAUTIONS: Fall  WEIGHT BEARING RESTRICTIONS: No  FALLS: Has patient fallen in last 6 months? Yes. Number of falls 1  LIVING ENVIRONMENT: Lives with: lives with their spouse Lives in: House/apartment Stairs: Yes: External: 2 steps; has grab bar Has following equipment at home: Vannie - 2 wheeled, Environmental Consultant - 4 wheeled, and Grab bars  PLOF: Independent with household mobility with device,  Independent with community mobility with device, Independent with transfers, and Requires assistive device for independence  PATIENT GOALS: Get to where I can walk better again and get my legs stronger.   OBJECTIVE:    TODAY'S TREATMENT:                                                                                                                              DATE: 04/08/2023     TA: to Improve ROM/Strength and to simulate independence with all gym equipment for future transition to self care.  Well[employer]-gym  Leg press - level 4 2 sets x 10; Level 5- 1 set x 10  Calf press - Level 4 3 sets x 10  Knee ext - Level 2 - 2 sets x 10 reps Ham curl- Level 2 - 2 sets x 10 reps Bicep curl BUE- Level 3 3 sets x 10 reps  Tricep ext press down- BUE- level 5 - 3 sets of 10 reps  Scap retraction Level 3 BUE- 2 sets of 10 reps  VC for proper use of equipment and VC/tactile cues for correct performance ensuring safety with all equipment today.    Sit to stand x6, x4 reps - easy, but fatigued  Seated march 7x each LE - discontinued to to hip pain  Gait for endurance/speed and improved functional capacity x 148 ft with RW  PATIENT EDUCATION: Education details: Exercise technique; gym exercises Person educated: Patient Education method: Explanation, Demonstration, Tactile cues, and Verbal cues Education comprehension: verbalized understanding, returned demonstration, verbal cues required, tactile cues required, and needs further education  HOME EXERCISE PROGRAM: Access Code: [email or web address] URL: [email or web address] Date: 08/11/2022 Prepared by: Reyes London  Exercises - Seated Lumbar Flexion Stretch  - 7 x weekly - 3 sets - 30 sec hold - Seated Thoracic Flexion and Rotation with Arms Crossed  - 1 x daily - 7 x weekly - 3 sets - 30 sec hold - Seated Figure 4 Piriformis Stretch  - 1 x daily - 7 x weekly - 3 sets - 10 reps  GOALS: Goals reviewed with patient? Yes  SHORT TERM  GOALS: Target date: 09/20/2022  Pt will be independent with a comprehensive HEP in order to improve strength and balance in order to decrease fall risk and improve function at home and work.  Baseline: EVAL: Patient admits not performing much exercises - not moving around well. 10/25/2022-Patient reports not as compliant with previous HEP as instructed- Verbally reviewed Low back, cervical, thoracic stretching and importance of daily walking. 11/03/2022= Patient reports independent with cervical Stretches and some walking at home Goal status: MET   LONG TERM GOALS: Target date: 04/25/2023  Pt will improve FOTO to target score of 45 % to display perceived improvements in ability to complete ADL's.  Baseline: EVAL: 42: 11/03/2022= 55 Goal status: MET  2.  Pt will decrease 5TSTS by at least 5 seconds in order to demonstrate clinically significant improvement in LE strength.  Baseline: EVAL = 17.39 sec without UE support; 10/25/2022= 12.91 sec 12/2: 12.02 sec Goal status: MET  3. Pt will increase by at least 0.15 m/s in order to demonstrate clinically significant improvement in community ambulation   Baseline: EVAL= 0.67 m/s; 10/25/2022= 0.77 m/s 12/2: 0.84 m/s with 4WW Goal status: MET  4.  Pt will improve BERG by at least 3 points in order to demonstrate clinically significant improvement in balance.   Baseline: EVAL: to be assessed next visit: 08/23/2022= 36/56; 11/03/2022= 40/56 Goal status: MET  5.  Pt will decrease TUG to below 14 seconds/decrease in order to demonstrate decreased fall risk. Baseline: EVAL: 22.00 sec with upright 4WW; 10/25/2022= 15.98 sec with 4WW 12/2: 16.40 sec; 03/29/2023=15.80 with upright 4WW Goal status: PROGRESSING  6.  Pt will increase by at least 64m (141ft) in order to demonstrate clinically significant improvement in cardiopulmonary endurance and community ambulation  Baseline: EVAL: 3:45 min (450 feet with upright 4WW) 10/25/2022= 830 feet with 4WW Goal  status: MET  7.   Patient will report returning to walking in to community places using most appropriate assistive device vs current use of scooter for improved abilities with mobility during community outings Baseline: EVAL: Current using scooter for many social/community outings; 10/25/2022- Paitent continues to scooter for long distance community distances- due to some fatigue or SOB.  12/2: Only uses scooter when he has to ge tin the yard, does not use AD in home or garage majority of the time. Goal status: PROGRESSING  8.  Pt will increase > 1000 feet in order to demonstrate clinically significant improvement in cardiopulmonary endurance and community ambulation   Baseline: 10/25/2022= 830 feet with 4WW 12/2: 919 feet with 4WW;  03/30/2023= 988 feet with upright 4WW  Goal status= PROGRESSING  9.  Pt will improve BERG by at least 3 points in order to demonstrate clinically significant improvement in balance.   Baseline: 11/03/2022= 40/56 12/2: 44/56 Goal status: MET    10. Pt will transition into gym-based exercise routine upon discharge in order to maintain progress of strength and balance  Baseline: 01/31/2023: Future interventions will involve therex in [facility] to prescribe pt proper exercise routine upon discharge; 03/29/2023= Patient able to perform some gym based exercises today but limited secondary to some Right hip pain.  Goal Status: ONGOING  ASSESSMENT:  CLINICAL IMPRESSION:   Continued per plan of care focusing on LE strengthening and teaching how to use all equipment correctly along with exercises pertinent to Parkinsons. He responded okay- some difficulty negotiating equipment and required some physical assist to perform correctly.  Patient will continue to benefit from skilled PT services  to address deficits and impairment identified in evaluation in order to maximize independence and safety in basic mobility required for performance of ADL, IADL, and leisure.     OBJECTIVE IMPAIRMENTS: Abnormal gait, cardiopulmonary status limiting activity, decreased activity tolerance, decreased balance, decreased coordination, decreased endurance, decreased mobility, difficulty walking, decreased ROM, decreased strength, hypomobility, impaired flexibility, impaired sensation, impaired UE functional use, postural dysfunction, obesity, and pain.   ACTIVITY LIMITATIONS: carrying, lifting, bending, standing, squatting, stairs, transfers, continence, and reach over head  PARTICIPATION LIMITATIONS: meal prep, cleaning, laundry, driving, shopping, community activity, and yard work  PERSONAL FACTORS: Age and 3+ comorbidities: HTN, Arthritis, Low back pain, Trigeminal Neuralgia  are also affecting patient's functional outcome.   REHAB POTENTIAL: Good  CLINICAL DECISION MAKING: Evolving/moderate complexity  EVALUATION COMPLEXITY: Moderate  PLAN:  PT FREQUENCY: 1-2x/week  PT DURATION: 12 weeks  PLANNED INTERVENTIONS: Therapeutic exercises, Therapeutic activity, Neuromuscular re-education, Balance training, Gait training, Patient/Family education, Self Care, Joint mobilization, Stair training, Vestibular training, Canalith repositioning, DME instructions, Dry Needling, Electrical stimulation, Spinal manipulation, Spinal mobilization, Cryotherapy, Moist heat, Manual therapy, and Re-evaluation  PLAN FOR NEXT SESSION: Progress therex and balance training. Continue with  training in Well [employer] for UE/LE strengthening and cardiovascular training.    9:38 AM, 04/11/23  Chyrl London, PT Physical Therapist - [facility]  [phone] [facility])    9:38 AM, 04/11/23

## 2023-04-11 ENCOUNTER — Ambulatory Visit: Payer: Medicare Other

## 2023-04-11 DIAGNOSIS — R2689 Other abnormalities of gait and mobility: Secondary | ICD-10-CM

## 2023-04-11 DIAGNOSIS — M6281 Muscle weakness (generalized): Secondary | ICD-10-CM

## 2023-04-11 DIAGNOSIS — R2681 Unsteadiness on feet: Secondary | ICD-10-CM

## 2023-04-11 DIAGNOSIS — R262 Difficulty in walking, not elsewhere classified: Secondary | ICD-10-CM

## 2023-04-11 DIAGNOSIS — R269 Unspecified abnormalities of gait and mobility: Secondary | ICD-10-CM

## 2023-04-11 DIAGNOSIS — G8929 Other chronic pain: Secondary | ICD-10-CM

## 2023-04-11 DIAGNOSIS — R278 Other lack of coordination: Secondary | ICD-10-CM

## 2023-04-11 NOTE — Therapy (Signed)
OUTPATIENT PHYSICAL THERAPY TREATMENT   Patient Name: [name] MRN: [id number] DOB:02-02-1946, 78 y.o., male Today's Date: 04/12/2023   PCP: Ardyth Man, PA- C REFERRING PROVIDER: Morene Crocker, MD  END OF SESSION:  PT End of Session - 04/11/23 1055     Visit Number 53    Number of Visits 63    Date for PT Re-Evaluation 04/25/23    Authorization Type [employer]    Progress Note Due on Visit 60    PT Start Time 1057    PT Stop Time 1143    PT Time Calculation (min) 46 min    Equipment Utilized During Treatment Gait belt    Activity Tolerance Patient tolerated treatment well;Patient limited by fatigue    Behavior During Therapy [facility] for tasks assessed/performed                     Past Medical History:  Diagnosis Date   Anxiety    Arteriosclerosis of coronary artery 11/16/2011   Overview:  Stent 10/2011 stent rca 2015 with collaterals to lad which is chronically occluded    Benign enlargement of prostate    Benign essential HTN 06/11/2014   Benign prostatic hypertrophy without urinary obstruction 07/31/2014   Bilateral cataracts 05/16/2013   Overview:  [facility]     Bone spur of foot    Left   BP (high blood pressure) 11/09/2012   Cancer (HCC)    skin (forehead) and bladder   Carotid artery narrowing 02/08/2014   Depression    Detrusor hypertrophy    Diabetes (HCC)    Diabetes mellitus, type 2 (HCC) 12/12/2012   Diverticulosis    Dyspnea    Esophageal reflux    Esophageal reflux    Fothergill's neuralgia 08/07/2012   Overview:  [facility]    Gastritis    GERD (gastroesophageal reflux disease)    Headache    cluster headaches   Healed myocardial infarct 11/09/2012   Hearing loss in left ear    Heart disease    Hematuria    Hemorrhoids    History of hiatal hernia 12/14/2017   small    Hypercholesteremia    Lesion of bladder    Myocardial infarct (HCC)    Presence of stent in coronary artery 11/09/2012   Rectal  bleeding    Trigeminal neuralgia    Trigeminal neuralgia    Valvular heart disease    Vitamin D deficiency    Past Surgical History:  Procedure Laterality Date   APPENDECTOMY     BOTOX INJECTION N/A 12/21/2017   Procedure: Bladder BOTOX INJECTION;  Surgeon: Vanna Scotland, MD;  Location: [facility] ORS;  Service: Urology;  Laterality: N/A;   BOTOX INJECTION N/A 09/11/2018   Procedure: Bladder BOTOX INJECTION;  Surgeon: Vanna Scotland, MD;  Location: [facility] ORS;  Service: Urology;  Laterality: N/A;   CARDIAC CATHETERIZATION     CARDIAC CATHETERIZATION N/A 01/22/2015   Procedure: Left Heart Cath;  Surgeon: Lamar Blinks, MD;  Location: [facility] INVASIVE CV LAB;  Service: Cardiovascular;  Laterality: N/A;   CARDIAC CATHETERIZATION N/A 01/22/2015   Procedure: Coronary Stent Intervention;  Surgeon: Marcina Millard, MD;  Location: [facility] INVASIVE CV LAB;  Service: Cardiovascular;  Laterality: N/A;   CATARACT EXTRACTION, BILATERAL     COLONOSCOPY WITH PROPOFOL N/A 12/08/2015   Procedure: COLONOSCOPY WITH PROPOFOL;  Surgeon: Christena Deem, MD;  Location: [facility] ENDOSCOPY;  Service: Endoscopy;  Laterality: N/A;   COLONOSCOPY WITH PROPOFOL N/A  12/09/2015   Procedure: COLONOSCOPY WITH PROPOFOL;  Surgeon: Christena Deem, MD;  Location: [facility] ENDOSCOPY;  Service: Endoscopy;  Laterality: N/A;   CORONARY ANGIOPLASTY     5 stents   CORONARY STENT PLACEMENT  2015   x5   CYSTOSCOPY N/A 09/11/2018   Procedure: CYSTOSCOPY;  Surgeon: Vanna Scotland, MD;  Location: [facility] ORS;  Service: Urology;  Laterality: N/A;   CYSTOSCOPY WITH BIOPSY N/A 12/21/2017   Procedure: CYSTOSCOPY WITH BIOPSY;  Surgeon: Vanna Scotland, MD;  Location: [facility] ORS;  Service: Urology;  Laterality: N/A;   ESOPHAGOGASTRODUODENOSCOPY (EGD) WITH PROPOFOL N/A 12/08/2015   Procedure: ESOPHAGOGASTRODUODENOSCOPY (EGD) WITH PROPOFOL;  Surgeon: Christena Deem, MD;  Location: [facility] ENDOSCOPY;  Service: Endoscopy;  Laterality: N/A;    ESOPHAGOGASTRODUODENOSCOPY (EGD) WITH PROPOFOL N/A 12/13/2017   Procedure: ESOPHAGOGASTRODUODENOSCOPY (EGD) WITH PROPOFOL;  Surgeon: Christena Deem, MD;  Location: [facility] ENDOSCOPY;  Service: Endoscopy;  Laterality: N/A;   EYE SURGERY     HERNIA REPAIR     kidney tumor remove     TRANSURETHRAL RESECTION OF BLADDER TUMOR WITH GYRUS (TURBT-GYRUS)  12/2013   UMBILICAL HERNIA REPAIR     urethral meatotomy     Patient Active Problem List   Diagnosis Date Noted   Class 2 obesity due to excess calories with body mass index (BMI) of 36.0 to 36.9 in adult 04/11/2020   Swelling of limb 03/28/2020   Lymphedema 03/28/2020   Trigeminal neuralgia 12/25/2019   Lower limb ulcer, calf, left, limited to breakdown of skin (HCC) 12/25/2019   OSA (obstructive sleep apnea) 01/23/2019   Acute systolic CHF (congestive heart failure) (HCC) 12/12/2018   Bruising 07/10/2018   Diet-controlled type 2 diabetes mellitus (HCC) 03/24/2018   Microalbuminuria 03/24/2018   Dizziness 11/17/2017   Simple chronic bronchitis (HCC) 09/22/2017   SOBOE (shortness of breath on exertion) 02/18/2017   Chronic midline low back pain without sciatica 12/29/2016   Periodic limb movement disorder 12/29/2016   Benign essential tremor 06/22/2016   Pure hypercholesterolemia 06/20/2015   Erectile dysfunction due to arterial insufficiency 06/16/2015   Unstable angina (HCC) 01/22/2015   Abdominal aortic 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 rupture (HCC) 12/31/2014   Benign prostatic hyperplasia without urinary obstruction 07/31/2014   Enlarged prostate 07/31/2014   Benign essential HTN 06/11/2014   Carotid artery narrowing 02/08/2014   Carotid artery obstruction 02/08/2014   Bilateral carotid artery stenosis 02/08/2014   Chest pain 08/20/2013   Bilateral cataracts 05/16/2013   Cataract 05/16/2013   Clinical depression 12/12/2012   Diabetes mellitus, type 2 (HCC) 12/12/2012   Combined fat and carbohydrate induced hyperlipemia 12/12/2012    Major depressive disorder, single episode, unspecified 12/12/2012   Acid reflux 11/09/2012   Presence of stent in coronary artery 11/09/2012   BP (high blood pressure) 11/09/2012   Healed myocardial infarct 11/09/2012   Gastro-esophageal reflux disease without esophagitis 11/09/2012   History of cardiovascular surgery 11/09/2012   Fothergill's neuralgia 08/07/2012   Swelling of testicle 04/06/2012   Disorder of male genital organ 04/06/2012   Swelling of the testicles 04/06/2012   Arteriosclerosis of coronary artery 11/16/2011   CAD in native artery 11/16/2011   Fatigue 06/04/2011   Avitaminosis D 11/30/2010    ONSET DATE: 03/17/2022- Worsening symptoms  REFERRING DIAG:  G20.A1 (ICD-10-CM) - Parkinsons  R26.2 (ICD-10-CM) - Difficulty walking    THERAPY DIAG:  Muscle weakness (generalized)  Difficulty in walking, not elsewhere classified  Unsteadiness on feet  Chronic bilateral low back pain without sciatica  Other abnormalities of  gait and mobility  Abnormality of gait and mobility  Other lack of coordination  Rationale for Evaluation and Treatment: Rehabilitation  SUBJECTIVE:                                                                                                                                                                                             SUBJECTIVE STATEMENT:    Patient reports feeling okay- no current right hip pain and no reported falls. States using walker for all mobility in community.      Pt accompanied by: significant other  PERTINENT HISTORY: Patient is a 78 year old male with referral to outpatient PT for Parkinsons and difficulty walking. He was recently seen by Neurology worsening symptoms of parkinson- difficulty with walking. He has past medical history of Arthritis, Bladder cancer, and Trigeminal Neuralgia.  PAIN:  Are you having pain? Yes 4/10 low back   PRECAUTIONS: Fall  WEIGHT BEARING RESTRICTIONS: No  FALLS: Has  patient fallen in last 6 months? Yes. Number of falls 1  LIVING ENVIRONMENT: Lives with: lives with their spouse Lives in: House/apartment Stairs: Yes: External: 2 steps; has grab bar Has following equipment at home: Dan Humphreys - 2 wheeled, Environmental consultant - 4 wheeled, and Grab bars  PLOF: Independent with household mobility with device, Independent with community mobility with device, Independent with transfers, and Requires assistive device for independence  PATIENT GOALS: Get to where I can walk better again and get my legs stronger.   OBJECTIVE:    TODAY'S TREATMENT:                                                                                                                              DATE: 04/11/2023     TA: to Improve ROM/Strength and to simulate independence with all gym equipment for future transition to self care.  Well[employer]-gym  Scifit- L2- BUE- 3 min forward/3 min backward- Teaching patient how to use equipment and work controls.  Chest press- L2- 3 sets x 10 reps (discussing different setting for chest/incline/ and shoulder)  Single UE bicep curl  using cable- instructing how to adjust weights 3 sets of 10 reps at plate 3 Cable standing trunk twist with plate 3 - 3 sets of 10  each direction Scap mid row- neutral grip - plate 3 - 3 sets of 10 reps   VC for proper use of equipment and VC/tactile cues for correct performance ensuring safety with all equipment today.     PATIENT EDUCATION: Education details: Exercise technique; gym exercises Person educated: Patient Education method: Explanation, Demonstration, Tactile cues, and Verbal cues Education comprehension: verbalized understanding, returned demonstration, verbal cues required, tactile cues required, and needs further education  HOME EXERCISE PROGRAM: Access Code: [email or web address] URL: [email or web address] Date: 08/11/2022 Prepared by: Maureen Ralphs  Exercises - Seated Lumbar Flexion Stretch  - 7 x weekly  - 3 sets - 30 sec hold - Seated Thoracic Flexion and Rotation with Arms Crossed  - 1 x daily - 7 x weekly - 3 sets - 30 sec hold - Seated Figure 4 Piriformis Stretch  - 1 x daily - 7 x weekly - 3 sets - 10 reps  GOALS: Goals reviewed with patient? Yes  SHORT TERM GOALS: Target date: 09/20/2022  Pt will be independent with a comprehensive HEP in order to improve strength and balance in order to decrease fall risk and improve function at home and work.  Baseline: EVAL: Patient admits not performing much exercises - not moving around well. 10/25/2022-Patient reports not as compliant with previous HEP as instructed- Verbally reviewed Low back, cervical, thoracic stretching and importance of daily walking. 11/03/2022= Patient reports independent with cervical Stretches and some walking at home Goal status: MET   LONG TERM GOALS: Target date: 04/25/2023  Pt will improve FOTO to target score of 45 % to display perceived improvements in ability to complete ADL's.  Baseline: EVAL: 42: 11/03/2022= 55 Goal status: MET  2.  Pt will decrease 5TSTS by at least 5 seconds in order to demonstrate clinically significant improvement in LE strength.  Baseline: EVAL = 17.39 sec without UE support; 10/25/2022= 12.91 sec 12/2: 12.02 sec Goal status: MET  3. Pt will increase by at least 0.15 m/s in order to demonstrate clinically significant improvement in community ambulation   Baseline: EVAL= 0.67 m/s; 10/25/2022= 0.77 m/s 12/2: 0.84 m/s with 4WW Goal status: MET  4.  Pt will improve BERG by at least 3 points in order to demonstrate clinically significant improvement in balance.   Baseline: EVAL: to be assessed next visit: 08/23/2022= 36/56; 11/03/2022= 40/56 Goal status: MET  5.  Pt will decrease TUG to below 14 seconds/decrease in order to demonstrate decreased fall risk. Baseline: EVAL: 22.00 sec with upright 4WW; 10/25/2022= 15.98 sec with 4WW 12/2: 16.40 sec; 03/29/2023=15.80 with upright 4WW Goal  status: PROGRESSING  6.  Pt will increase by at least 38m (197ft) in order to demonstrate clinically significant improvement in cardiopulmonary endurance and community ambulation  Baseline: EVAL: 3:45 min (450 feet with upright 4WW) 10/25/2022= 830 feet with 4WW Goal status: MET  7.   Patient will report returning to walking in to community places using most appropriate assistive device vs current use of scooter for improved abilities with mobility during community outings Baseline: EVAL: Current using scooter for many social/community outings; 10/25/2022- Paitent continues to scooter for long distance community distances- due to some fatigue or SOB.  12/2: Only uses scooter when he has to ge tin the yard, does not use AD in home or garage majority of the time. Goal status: PROGRESSING  8.  Pt will increase > 1000 feet in order to demonstrate clinically significant improvement in cardiopulmonary endurance and community ambulation   Baseline: 10/25/2022= 830 feet with 4WW 12/2: 919 feet with 4WW;  03/30/2023= 988 feet with upright 4WW  Goal status= PROGRESSING  9.  Pt will improve BERG by at least 3 points in order to demonstrate clinically significant improvement in balance.   Baseline: 11/03/2022= 40/56 12/2: 44/56 Goal status: MET    10. Pt will transition into gym-based exercise routine upon discharge in order to maintain progress of strength and balance  Baseline: 01/31/2023: Future interventions will involve therex in [facility] to prescribe pt proper exercise routine upon discharge; 03/29/2023= Patient able to perform some gym based exercises today but limited secondary to some Right hip pain.  Goal Status: ONGOING  ASSESSMENT:  CLINICAL IMPRESSION:   Treatment continued with further gym instruction related to exercises to assist with parkinsons symptoms- weakness, limited trunk ROM, decreaesd coordination, and cardiovascular exercises. Patient has some diffculty negotiating  getting on and off equipment requiring some supervision at this time. He was responsive to all VC and learned some new exercises that he will be able to incorporate in the gym setting. Patient will continue to benefit from skilled PT services to address deficits and impairment identified in evaluation in order to maximize independence and safety in basic mobility required for performance of ADL, IADL, and leisure.    OBJECTIVE IMPAIRMENTS: Abnormal gait, cardiopulmonary status limiting activity, decreased activity tolerance, decreased balance, decreased coordination, decreased endurance, decreased mobility, difficulty walking, decreased ROM, decreased strength, hypomobility, impaired flexibility, impaired sensation, impaired UE functional use, postural dysfunction, obesity, and pain.   ACTIVITY LIMITATIONS: carrying, lifting, bending, standing, squatting, stairs, transfers, continence, and reach over head  PARTICIPATION LIMITATIONS: meal prep, cleaning, laundry, driving, shopping, community activity, and yard work  PERSONAL FACTORS: Age and 3+ comorbidities: HTN, Arthritis, Low back pain, Trigeminal Neuralgia  are also affecting patient's functional outcome.   REHAB POTENTIAL: Good  CLINICAL DECISION MAKING: Evolving/moderate complexity  EVALUATION COMPLEXITY: Moderate  PLAN:  PT FREQUENCY: 1-2x/week  PT DURATION: 12 weeks  PLANNED INTERVENTIONS: Therapeutic exercises, Therapeutic activity, Neuromuscular re-education, Balance training, Gait training, Patient/Family education, Self Care, Joint mobilization, Stair training, Vestibular training, Canalith repositioning, DME instructions, Dry Needling, Electrical stimulation, Spinal manipulation, Spinal mobilization, Cryotherapy, Moist heat, Manual therapy, and Re-evaluation  PLAN FOR NEXT SESSION: Progress therex and balance training. Continue with  training in Well [employer] for UE/LE strengthening and cardiovascular training.    8:14 AM,  04/12/23  Louis Meckel, PT Physical Therapist - [facility]  [phone] [facility])    8:14 AM, 04/12/23

## 2023-04-13 ENCOUNTER — Ambulatory Visit: Payer: Medicare Other

## 2023-04-15 ENCOUNTER — Ambulatory Visit: Payer: Medicare Other

## 2023-04-15 DIAGNOSIS — R262 Difficulty in walking, not elsewhere classified: Secondary | ICD-10-CM

## 2023-04-15 DIAGNOSIS — R2681 Unsteadiness on feet: Secondary | ICD-10-CM

## 2023-04-15 DIAGNOSIS — M6281 Muscle weakness (generalized): Secondary | ICD-10-CM | POA: Diagnosis not present

## 2023-04-15 DIAGNOSIS — R2689 Other abnormalities of gait and mobility: Secondary | ICD-10-CM

## 2023-04-15 DIAGNOSIS — R278 Other lack of coordination: Secondary | ICD-10-CM

## 2023-04-15 DIAGNOSIS — M545 Low back pain, unspecified: Secondary | ICD-10-CM

## 2023-04-15 DIAGNOSIS — R269 Unspecified abnormalities of gait and mobility: Secondary | ICD-10-CM

## 2023-04-15 NOTE — Therapy (Signed)
OUTPATIENT PHYSICAL THERAPY TREATMENT   Patient Name: [name] MRN: [id number] DOB:January 15, 1946, 78 y.o., male Today's Date: 04/17/2023   PCP: Ardyth Man, PA- C REFERRING PROVIDER: Morene Crocker, MD  END OF SESSION:  PT End of Session - 04/17/23 1154     Visit Number 54    Number of Visits 63    Date for PT Re-Evaluation 04/25/23    Authorization Type [employer]    Progress Note Due on Visit 60    PT Start Time 1020    PT Stop Time 1105    PT Time Calculation (min) 45 min    Equipment Utilized During Treatment Gait belt    Activity Tolerance Patient tolerated treatment well;Patient limited by fatigue    Behavior During Therapy [facility] for tasks assessed/performed                     Past Medical History:  Diagnosis Date   Anxiety    Arteriosclerosis of coronary artery 11/16/2011   Overview:  Stent 10/2011 stent rca 2015 with collaterals to lad which is chronically occluded    Benign enlargement of prostate    Benign essential HTN 06/11/2014   Benign prostatic hypertrophy without urinary obstruction 07/31/2014   Bilateral cataracts 05/16/2013   Overview:  [facility]     Bone spur of foot    Left   BP (high blood pressure) 11/09/2012   Cancer (HCC)    skin (forehead) and bladder   Carotid artery narrowing 02/08/2014   Depression    Detrusor hypertrophy    Diabetes (HCC)    Diabetes mellitus, type 2 (HCC) 12/12/2012   Diverticulosis    Dyspnea    Esophageal reflux    Esophageal reflux    Fothergill's neuralgia 08/07/2012   Overview:  [facility]    Gastritis    GERD (gastroesophageal reflux disease)    Headache    cluster headaches   Healed myocardial infarct 11/09/2012   Hearing loss in left ear    Heart disease    Hematuria    Hemorrhoids    History of hiatal hernia 12/14/2017   small    Hypercholesteremia    Lesion of bladder    Myocardial infarct (HCC)    Presence of stent in coronary artery 11/09/2012   Rectal  bleeding    Trigeminal neuralgia    Trigeminal neuralgia    Valvular heart disease    Vitamin D deficiency    Past Surgical History:  Procedure Laterality Date   APPENDECTOMY     BOTOX INJECTION N/A 12/21/2017   Procedure: Bladder BOTOX INJECTION;  Surgeon: Vanna Scotland, MD;  Location: [facility] ORS;  Service: Urology;  Laterality: N/A;   BOTOX INJECTION N/A 09/11/2018   Procedure: Bladder BOTOX INJECTION;  Surgeon: Vanna Scotland, MD;  Location: [facility] ORS;  Service: Urology;  Laterality: N/A;   CARDIAC CATHETERIZATION     CARDIAC CATHETERIZATION N/A 01/22/2015   Procedure: Left Heart Cath;  Surgeon: Lamar Blinks, MD;  Location: [facility] INVASIVE CV LAB;  Service: Cardiovascular;  Laterality: N/A;   CARDIAC CATHETERIZATION N/A 01/22/2015   Procedure: Coronary Stent Intervention;  Surgeon: Marcina Millard, MD;  Location: [facility] INVASIVE CV LAB;  Service: Cardiovascular;  Laterality: N/A;   CATARACT EXTRACTION, BILATERAL     COLONOSCOPY WITH PROPOFOL N/A 12/08/2015   Procedure: COLONOSCOPY WITH PROPOFOL;  Surgeon: Christena Deem, MD;  Location: [facility] ENDOSCOPY;  Service: Endoscopy;  Laterality: N/A;   COLONOSCOPY WITH PROPOFOL N/A  12/09/2015   Procedure: COLONOSCOPY WITH PROPOFOL;  Surgeon: Christena Deem, MD;  Location: [facility] ENDOSCOPY;  Service: Endoscopy;  Laterality: N/A;   CORONARY ANGIOPLASTY     5 stents   CORONARY STENT PLACEMENT  2015   x5   CYSTOSCOPY N/A 09/11/2018   Procedure: CYSTOSCOPY;  Surgeon: Vanna Scotland, MD;  Location: [facility] ORS;  Service: Urology;  Laterality: N/A;   CYSTOSCOPY WITH BIOPSY N/A 12/21/2017   Procedure: CYSTOSCOPY WITH BIOPSY;  Surgeon: Vanna Scotland, MD;  Location: [facility] ORS;  Service: Urology;  Laterality: N/A;   ESOPHAGOGASTRODUODENOSCOPY (EGD) WITH PROPOFOL N/A 12/08/2015   Procedure: ESOPHAGOGASTRODUODENOSCOPY (EGD) WITH PROPOFOL;  Surgeon: Christena Deem, MD;  Location: [facility] ENDOSCOPY;  Service: Endoscopy;  Laterality: N/A;    ESOPHAGOGASTRODUODENOSCOPY (EGD) WITH PROPOFOL N/A 12/13/2017   Procedure: ESOPHAGOGASTRODUODENOSCOPY (EGD) WITH PROPOFOL;  Surgeon: Christena Deem, MD;  Location: [facility] ENDOSCOPY;  Service: Endoscopy;  Laterality: N/A;   EYE SURGERY     HERNIA REPAIR     kidney tumor remove     TRANSURETHRAL RESECTION OF BLADDER TUMOR WITH GYRUS (TURBT-GYRUS)  12/2013   UMBILICAL HERNIA REPAIR     urethral meatotomy     Patient Active Problem List   Diagnosis Date Noted   Class 2 obesity due to excess calories with body mass index (BMI) of 36.0 to 36.9 in adult 04/11/2020   Swelling of limb 03/28/2020   Lymphedema 03/28/2020   Trigeminal neuralgia 12/25/2019   Lower limb ulcer, calf, left, limited to breakdown of skin (HCC) 12/25/2019   OSA (obstructive sleep apnea) 01/23/2019   Acute systolic CHF (congestive heart failure) (HCC) 12/12/2018   Bruising 07/10/2018   Diet-controlled type 2 diabetes mellitus (HCC) 03/24/2018   Microalbuminuria 03/24/2018   Dizziness 11/17/2017   Simple chronic bronchitis (HCC) 09/22/2017   SOBOE (shortness of breath on exertion) 02/18/2017   Chronic midline low back pain without sciatica 12/29/2016   Periodic limb movement disorder 12/29/2016   Benign essential tremor 06/22/2016   Pure hypercholesterolemia 06/20/2015   Erectile dysfunction due to arterial insufficiency 06/16/2015   Unstable angina (HCC) 01/22/2015   Abdominal aortic 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 rupture (HCC) 12/31/2014   Benign prostatic hyperplasia without urinary obstruction 07/31/2014   Enlarged prostate 07/31/2014   Benign essential HTN 06/11/2014   Carotid artery narrowing 02/08/2014   Carotid artery obstruction 02/08/2014   Bilateral carotid artery stenosis 02/08/2014   Chest pain 08/20/2013   Bilateral cataracts 05/16/2013   Cataract 05/16/2013   Clinical depression 12/12/2012   Diabetes mellitus, type 2 (HCC) 12/12/2012   Combined fat and carbohydrate induced hyperlipemia 12/12/2012    Major depressive disorder, single episode, unspecified 12/12/2012   Acid reflux 11/09/2012   Presence of stent in coronary artery 11/09/2012   BP (high blood pressure) 11/09/2012   Healed myocardial infarct 11/09/2012   Gastro-esophageal reflux disease without esophagitis 11/09/2012   History of cardiovascular surgery 11/09/2012   Fothergill's neuralgia 08/07/2012   Swelling of testicle 04/06/2012   Disorder of male genital organ 04/06/2012   Swelling of the testicles 04/06/2012   Arteriosclerosis of coronary artery 11/16/2011   CAD in native artery 11/16/2011   Fatigue 06/04/2011   Avitaminosis D 11/30/2010    ONSET DATE: 03/17/2022- Worsening symptoms  REFERRING DIAG:  G20.A1 (ICD-10-CM) - Parkinsons  R26.2 (ICD-10-CM) - Difficulty walking    THERAPY DIAG:  Muscle weakness (generalized)  Difficulty in walking, not elsewhere classified  Unsteadiness on feet  Chronic bilateral low back pain without sciatica  Other abnormalities of  gait and mobility  Abnormality of gait and mobility  Other lack of coordination  Rationale for Evaluation and Treatment: Rehabilitation  SUBJECTIVE:                                                                                                                                                                                             SUBJECTIVE STATEMENT:   Patient reports having a good week with no current right hip pain.    Pt accompanied by: significant other  PERTINENT HISTORY: Patient is a 78 year old male with referral to outpatient PT for Parkinsons and difficulty walking. He was recently seen by Neurology worsening symptoms of parkinson- difficulty with walking. He has past medical history of Arthritis, Bladder cancer, and Trigeminal Neuralgia.  PAIN:  Are you having pain? Yes 4/10 low back   PRECAUTIONS: Fall  WEIGHT BEARING RESTRICTIONS: No  FALLS: Has patient fallen in last 6 months? Yes. Number of falls 1  LIVING  ENVIRONMENT: Lives with: lives with their spouse Lives in: House/apartment Stairs: Yes: External: 2 steps; has grab bar Has following equipment at home: Dan Humphreys - 2 wheeled, Environmental consultant - 4 wheeled, and Grab bars  PLOF: Independent with household mobility with device, Independent with community mobility with device, Independent with transfers, and Requires assistive device for independence  PATIENT GOALS: Get to where I can walk better again and get my legs stronger.   OBJECTIVE:    TODAY'S TREATMENT:                                                                                                                              DATE: 04/11/2023    Self care/Home management: Review of exercises to assist with symptoms of Parkinsons that can be performed in the home on days you can't make it to gym.   Sit to stand with UE Support - 2 sets of 10 reps Dynamic reaching- UE with calf raises x 15 reps Standing lumbar flex into Ext (instructed to perform at kitchen sink) 2 sets x 10 reps Standing thoracic trunk rotation with arms  out wide - x 15 reps.  Standing with dynamic overhead diagonal reaching x 10 reps each side. Lateral walking in //  bars (instructed to perform at kitchen counter) down and back x4 Step tap onto 6" block (to improve step height for all walking) 2 sets x 10 reps   PATIENT EDUCATION: Education details: Exercise technique; gym exercises Person educated: Patient Education method: Explanation, Demonstration, Tactile cues, and Verbal cues Education comprehension: verbalized understanding, returned demonstration, verbal cues required, tactile cues required, and needs further education  HOME EXERCISE PROGRAM: Access Code: [email or web address] URL: [email or web address] Date: 08/11/2022 Prepared by: Maureen Ralphs  Exercises - Seated Lumbar Flexion Stretch  - 7 x weekly - 3 sets - 30 sec hold - Seated Thoracic Flexion and Rotation with Arms Crossed  - 1 x daily - 7 x  weekly - 3 sets - 30 sec hold - Seated Figure 4 Piriformis Stretch  - 1 x daily - 7 x weekly - 3 sets - 10 reps  GOALS: Goals reviewed with patient? Yes  SHORT TERM GOALS: Target date: 09/20/2022  Pt will be independent with a comprehensive HEP in order to improve strength and balance in order to decrease fall risk and improve function at home and work.  Baseline: EVAL: Patient admits not performing much exercises - not moving around well. 10/25/2022-Patient reports not as compliant with previous HEP as instructed- Verbally reviewed Low back, cervical, thoracic stretching and importance of daily walking. 11/03/2022= Patient reports independent with cervical Stretches and some walking at home Goal status: MET   LONG TERM GOALS: Target date: 04/25/2023  Pt will improve FOTO to target score of 45 % to display perceived improvements in ability to complete ADL's.  Baseline: EVAL: 42: 11/03/2022= 55 Goal status: MET  2.  Pt will decrease 5TSTS by at least 5 seconds in order to demonstrate clinically significant improvement in LE strength.  Baseline: EVAL = 17.39 sec without UE support; 10/25/2022= 12.91 sec 12/2: 12.02 sec Goal status: MET  3. Pt will increase by at least 0.15 m/s in order to demonstrate clinically significant improvement in community ambulation   Baseline: EVAL= 0.67 m/s; 10/25/2022= 0.77 m/s 12/2: 0.84 m/s with 4WW Goal status: MET  4.  Pt will improve BERG by at least 3 points in order to demonstrate clinically significant improvement in balance.   Baseline: EVAL: to be assessed next visit: 08/23/2022= 36/56; 11/03/2022= 40/56 Goal status: MET  5.  Pt will decrease TUG to below 14 seconds/decrease in order to demonstrate decreased fall risk. Baseline: EVAL: 22.00 sec with upright 4WW; 10/25/2022= 15.98 sec with 4WW 12/2: 16.40 sec; 03/29/2023=15.80 with upright 4WW Goal status: PROGRESSING  6.  Pt will increase by at least 110m (150ft) in order to demonstrate  clinically significant improvement in cardiopulmonary endurance and community ambulation  Baseline: EVAL: 3:45 min (450 feet with upright 4WW) 10/25/2022= 830 feet with 4WW Goal status: MET  7.   Patient will report returning to walking in to community places using most appropriate assistive device vs current use of scooter for improved abilities with mobility during community outings Baseline: EVAL: Current using scooter for many social/community outings; 10/25/2022- Paitent continues to scooter for long distance community distances- due to some fatigue or SOB.  12/2: Only uses scooter when he has to ge tin the yard, does not use AD in home or garage majority of the time. Goal status: PROGRESSING  8.  Pt will increase > 1000 feet in order to demonstrate clinically  significant improvement in cardiopulmonary endurance and community ambulation   Baseline: 10/25/2022= 830 feet with 4WW 12/2: 919 feet with 4WW;  03/30/2023= 988 feet with upright 4WW  Goal status= PROGRESSING  9.  Pt will improve BERG by at least 3 points in order to demonstrate clinically significant improvement in balance.   Baseline: 11/03/2022= 40/56 12/2: 44/56 Goal status: MET    10. Pt will transition into gym-based exercise routine upon discharge in order to maintain progress of strength and balance  Baseline: 01/31/2023: Future interventions will involve therex in [facility] to prescribe pt proper exercise routine upon discharge; 03/29/2023= Patient able to perform some gym based exercises today but limited secondary to some Right hip pain.  Goal Status: ONGOING  ASSESSMENT:  CLINICAL IMPRESSION:   Treatment today focused on reviewing home exercises for safe self care and home management. He required minimal Verbal cues to perform correctly. He was challenged some with lateral stepping and dynamic reaching due to overall stiffness.  Patient will continue to benefit from skilled PT services to address deficits and  impairment identified in evaluation in order to maximize independence and safety in basic mobility required for performance of ADL, IADL, and leisure.    OBJECTIVE IMPAIRMENTS: Abnormal gait, cardiopulmonary status limiting activity, decreased activity tolerance, decreased balance, decreased coordination, decreased endurance, decreased mobility, difficulty walking, decreased ROM, decreased strength, hypomobility, impaired flexibility, impaired sensation, impaired UE functional use, postural dysfunction, obesity, and pain.   ACTIVITY LIMITATIONS: carrying, lifting, bending, standing, squatting, stairs, transfers, continence, and reach over head  PARTICIPATION LIMITATIONS: meal prep, cleaning, laundry, driving, shopping, community activity, and yard work  PERSONAL FACTORS: Age and 3+ comorbidities: HTN, Arthritis, Low back pain, Trigeminal Neuralgia  are also affecting patient's functional outcome.   REHAB POTENTIAL: Good  CLINICAL DECISION MAKING: Evolving/moderate complexity  EVALUATION COMPLEXITY: Moderate  PLAN:  PT FREQUENCY: 1-2x/week  PT DURATION: 12 weeks  PLANNED INTERVENTIONS: Therapeutic exercises, Therapeutic activity, Neuromuscular re-education, Balance training, Gait training, Patient/Family education, Self Care, Joint mobilization, Stair training, Vestibular training, Canalith repositioning, DME instructions, Dry Needling, Electrical stimulation, Spinal manipulation, Spinal mobilization, Cryotherapy, Moist heat, Manual therapy, and Re-evaluation  PLAN FOR NEXT SESSION: Progress therex and balance training. Continue with  training in [place] for UE/LE strengthening and cardiovascular training.    12:03 PM, 04/17/23  Louis Meckel, PT Physical Therapist - [facility]  [phone] [facility])    12:03 PM, 04/17/23

## 2023-04-18 ENCOUNTER — Ambulatory Visit: Payer: Medicare Other

## 2023-04-18 DIAGNOSIS — R2681 Unsteadiness on feet: Secondary | ICD-10-CM

## 2023-04-18 DIAGNOSIS — R278 Other lack of coordination: Secondary | ICD-10-CM

## 2023-04-18 DIAGNOSIS — M6281 Muscle weakness (generalized): Secondary | ICD-10-CM | POA: Diagnosis not present

## 2023-04-18 DIAGNOSIS — R2689 Other abnormalities of gait and mobility: Secondary | ICD-10-CM

## 2023-04-18 DIAGNOSIS — G8929 Other chronic pain: Secondary | ICD-10-CM

## 2023-04-18 DIAGNOSIS — R262 Difficulty in walking, not elsewhere classified: Secondary | ICD-10-CM

## 2023-04-18 DIAGNOSIS — R269 Unspecified abnormalities of gait and mobility: Secondary | ICD-10-CM

## 2023-04-18 NOTE — Therapy (Signed)
OUTPATIENT PHYSICAL THERAPY TREATMENT   Patient Name: [name] MRN: [id number] DOB:[birth year], 78 y.o., male Today's Date: 04/18/2023   PCP: Ardyth Man, PA- C REFERRING PROVIDER: Morene Crocker, MD  END OF SESSION:  PT End of Session - 04/18/23 1107     Visit Number 55    Number of Visits 63    Date for PT Re-Evaluation 04/25/23    Authorization Type [employer]    Progress Note Due on Visit 60    PT Start Time 1058    PT Stop Time 1143    PT Time Calculation (min) 45 min    Equipment Utilized During Treatment Gait belt    Activity Tolerance Patient tolerated treatment well;Patient limited by fatigue    Behavior During Therapy [facility] for tasks assessed/performed                      Past Medical History:  Diagnosis Date   Anxiety    Arteriosclerosis of coronary artery 11/16/2011   Overview:  Stent 10/2011 stent rca 2015 with collaterals to lad which is chronically occluded    Benign enlargement of prostate    Benign essential HTN 06/11/2014   Benign prostatic hypertrophy without urinary obstruction 07/31/2014   Bilateral cataracts 05/16/2013   Overview:  [facility]     Bone spur of foot    Left   BP (high blood pressure) 11/09/2012   Cancer (HCC)    skin (forehead) and bladder   Carotid artery narrowing 02/08/2014   Depression    Detrusor hypertrophy    Diabetes (HCC)    Diabetes mellitus, type 2 (HCC) 12/12/2012   Diverticulosis    Dyspnea    Esophageal reflux    Esophageal reflux    Fothergill's neuralgia 08/07/2012   Overview:  [facility]    Gastritis    GERD (gastroesophageal reflux disease)    Headache    cluster headaches   Healed myocardial infarct 11/09/2012   Hearing loss in left ear    Heart disease    Hematuria    Hemorrhoids    History of hiatal hernia 12/14/2017   small    Hypercholesteremia    Lesion of bladder    Myocardial infarct (HCC)    Presence of stent in coronary artery 11/09/2012   Rectal  bleeding    Trigeminal neuralgia    Trigeminal neuralgia    Valvular heart disease    Vitamin D deficiency    Past Surgical History:  Procedure Laterality Date   APPENDECTOMY     BOTOX INJECTION N/A 12/21/2017   Procedure: Bladder BOTOX INJECTION;  Surgeon: Vanna Scotland, MD;  Location: [facility] ORS;  Service: Urology;  Laterality: N/A;   BOTOX INJECTION N/A 09/11/2018   Procedure: Bladder BOTOX INJECTION;  Surgeon: Vanna Scotland, MD;  Location: [facility];  Service: Urology;  Laterality: N/A;   CARDIAC CATHETERIZATION     CARDIAC CATHETERIZATION N/A 01/22/2015   Procedure: Left Heart Cath;  Surgeon: Lamar Blinks, MD;  Location: [facility] INVASIVE CV LAB;  Service: Cardiovascular;  Laterality: N/A;   CARDIAC CATHETERIZATION N/A 01/22/2015   Procedure: Coronary Stent Intervention;  Surgeon: Marcina Millard, MD;  Location: [facility] INVASIVE CV LAB;  Service: Cardiovascular;  Laterality: N/A;   CATARACT EXTRACTION, BILATERAL     COLONOSCOPY WITH PROPOFOL N/A 12/08/2015   Procedure: COLONOSCOPY WITH PROPOFOL;  Surgeon: Christena Deem, MD;  Location: [facility] ENDOSCOPY;  Service: Endoscopy;  Laterality: N/A;   COLONOSCOPY WITH PROPOFOL  N/A 12/09/2015   Procedure: COLONOSCOPY WITH PROPOFOL;  Surgeon: Christena Deem, MD;  Location: [facility] ENDOSCOPY;  Service: Endoscopy;  Laterality: N/A;   CORONARY ANGIOPLASTY     5 stents   CORONARY STENT PLACEMENT  2015   x5   CYSTOSCOPY N/A 09/11/2018   Procedure: CYSTOSCOPY;  Surgeon: Vanna Scotland, MD;  Location: [facility] ORS;  Service: Urology;  Laterality: N/A;   CYSTOSCOPY WITH BIOPSY N/A 12/21/2017   Procedure: CYSTOSCOPY WITH BIOPSY;  Surgeon: Vanna Scotland, MD;  Location: [facility] ORS;  Service: Urology;  Laterality: N/A;   ESOPHAGOGASTRODUODENOSCOPY (EGD) WITH PROPOFOL N/A 12/08/2015   Procedure: ESOPHAGOGASTRODUODENOSCOPY (EGD) WITH PROPOFOL;  Surgeon: Christena Deem, MD;  Location: [facility] ENDOSCOPY;  Service: Endoscopy;  Laterality: N/A;    ESOPHAGOGASTRODUODENOSCOPY (EGD) WITH PROPOFOL N/A 12/13/2017   Procedure: ESOPHAGOGASTRODUODENOSCOPY (EGD) WITH PROPOFOL;  Surgeon: Christena Deem, MD;  Location: [facility] ENDOSCOPY;  Service: Endoscopy;  Laterality: N/A;   EYE SURGERY     HERNIA REPAIR     kidney tumor remove     TRANSURETHRAL RESECTION OF BLADDER TUMOR WITH GYRUS (TURBT-GYRUS)  12/2013   UMBILICAL HERNIA REPAIR     urethral meatotomy     Patient Active Problem List   Diagnosis Date Noted   Class 2 obesity due to excess calories with body mass index (BMI) of 36.0 to 36.9 in adult 04/11/2020   Swelling of limb 03/28/2020   Lymphedema 03/28/2020   Trigeminal neuralgia 12/25/2019   Lower limb ulcer, calf, left, limited to breakdown of skin (HCC) 12/25/2019   OSA (obstructive sleep apnea) 01/23/2019   Acute systolic CHF (congestive heart failure) (HCC) 12/12/2018   Bruising 07/10/2018   Diet-controlled type 2 diabetes mellitus (HCC) 03/24/2018   Microalbuminuria 03/24/2018   Dizziness 11/17/2017   Simple chronic bronchitis (HCC) 09/22/2017   SOBOE (shortness of breath on exertion) 02/18/2017   Chronic midline low back pain without sciatica 12/29/2016   Periodic limb movement disorder 12/29/2016   Benign essential tremor 06/22/2016   Pure hypercholesterolemia 06/20/2015   Erectile dysfunction due to arterial insufficiency 06/16/2015   Unstable angina (HCC) 01/22/2015   Abdominal aortic 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 rupture (HCC) 12/31/2014   Benign prostatic hyperplasia without urinary obstruction 07/31/2014   Enlarged prostate 07/31/2014   Benign essential HTN 06/11/2014   Carotid artery narrowing 02/08/2014   Carotid artery obstruction 02/08/2014   Bilateral carotid artery stenosis 02/08/2014   Chest pain 08/20/2013   Bilateral cataracts 05/16/2013   Cataract 05/16/2013   Clinical depression 12/12/2012   Diabetes mellitus, type 2 (HCC) 12/12/2012   Combined fat and carbohydrate induced hyperlipemia 12/12/2012    Major depressive disorder, single episode, unspecified 12/12/2012   Acid reflux 11/09/2012   Presence of stent in coronary artery 11/09/2012   BP (high blood pressure) 11/09/2012   Healed myocardial infarct 11/09/2012   Gastro-esophageal reflux disease without esophagitis 11/09/2012   History of cardiovascular surgery 11/09/2012   Fothergill's neuralgia 08/07/2012   Swelling of testicle 04/06/2012   Disorder of male genital organ 04/06/2012   Swelling of the testicles 04/06/2012   Arteriosclerosis of coronary artery 11/16/2011   CAD in native artery 11/16/2011   Fatigue 06/04/2011   Avitaminosis D 11/30/2010    ONSET DATE: 03/17/2022- Worsening symptoms  REFERRING DIAG:  G20.A1 (ICD-10-CM) - Parkinsons  R26.2 (ICD-10-CM) - Difficulty walking    THERAPY DIAG:  Muscle weakness (generalized)  Difficulty in walking, not elsewhere classified  Unsteadiness on feet  Chronic bilateral low back pain without sciatica  Other abnormalities  of gait and mobility  Abnormality of gait and mobility  Other lack of coordination  Rationale for Evaluation and Treatment: Rehabilitation  SUBJECTIVE:                                                                                                                                                                                             SUBJECTIVE STATEMENT:   Patient reports that he and his wife have officially joined the [facility].     Pt accompanied by: significant other  PERTINENT HISTORY: Patient is a 78 year old male with referral to outpatient PT for Parkinsons and difficulty walking. He was recently seen by Neurology worsening symptoms of parkinson- difficulty with walking. He has past medical history of Arthritis, Bladder cancer, and Trigeminal Neuralgia.  PAIN:  Are you having pain? Yes 4/10 low back   PRECAUTIONS: Fall  WEIGHT BEARING RESTRICTIONS: No  FALLS: Has patient fallen in last 6 months? Yes. Number of falls  1  LIVING ENVIRONMENT: Lives with: lives with their spouse Lives in: House/apartment Stairs: Yes: External: 2 steps; has grab bar Has following equipment at home: Dan Humphreys - 2 wheeled, Environmental consultant - 4 wheeled, and Grab bars  PLOF: Independent with household mobility with device, Independent with community mobility with device, Independent with transfers, and Requires assistive device for independence  PATIENT GOALS: Get to where I can walk better again and get my legs stronger.   OBJECTIVE:    TODAY'S TREATMENT:                                                                                                                              DATE: 04/18/23     Self care/Home management: Review of exercises to assist with symptoms of Parkinsons that can be performed in the [facility] gym   Mid row- Instruction in proper technique (keeping chest close to back rest)- and VC to perform slow and controlled- Level 3 - 3 sets of 12 reps Chest press with cable -plate 3- 2 sets x 10 reps  Thoracic trunk twist- plate 3- 2 sets  of 10 reps each side Knee ext- Plate 2 - 2 sets of 12 reps Knee flex- Plate 3 - 2 sets of 12 reps Sit to stand while holding onto 5#DB's from bench - 2 sets of 10 reps Nustep BUE/LE x 5 min varying levels 1-10.  PATIENT EDUCATION: Education details: Exercise technique; gym exercises Person educated: Patient Education method: Explanation, Demonstration, Tactile cues, and Verbal cues Education comprehension: verbalized understanding, returned demonstration, verbal cues required, tactile cues required, and needs further education  HOME EXERCISE PROGRAM: Access Code: [email or web address] URL: [email or web address] Date: 08/11/2022 Prepared by: Maureen Ralphs  Exercises - Seated Lumbar Flexion Stretch  - 7 x weekly - 3 sets - 30 sec hold - Seated Thoracic Flexion and Rotation with Arms Crossed  - 1 x daily - 7 x weekly - 3 sets - 30 sec hold - Seated Figure 4 Piriformis  Stretch  - 1 x daily - 7 x weekly - 3 sets - 10 reps  GOALS: Goals reviewed with patient? Yes  SHORT TERM GOALS: Target date: 09/20/2022  Pt will be independent with a comprehensive HEP in order to improve strength and balance in order to decrease fall risk and improve function at home and work.  Baseline: EVAL: Patient admits not performing much exercises - not moving around well. 10/25/2022-Patient reports not as compliant with previous HEP as instructed- Verbally reviewed Low back, cervical, thoracic stretching and importance of daily walking. 11/03/2022= Patient reports independent with cervical Stretches and some walking at home Goal status: MET   LONG TERM GOALS: Target date: 04/25/2023  Pt will improve FOTO to target score of 45 % to display perceived improvements in ability to complete ADL's.  Baseline: EVAL: 42: 11/03/2022= 55 Goal status: MET  2.  Pt will decrease 5TSTS by at least 5 seconds in order to demonstrate clinically significant improvement in LE strength.  Baseline: EVAL = 17.39 sec without UE support; 10/25/2022= 12.91 sec 12/2: 12.02 sec Goal status: MET  3. Pt will increase by at least 0.15 m/s in order to demonstrate clinically significant improvement in community ambulation   Baseline: EVAL= 0.67 m/s; 10/25/2022= 0.77 m/s 12/2: 0.84 m/s with 4WW Goal status: MET  4.  Pt will improve BERG by at least 3 points in order to demonstrate clinically significant improvement in balance.   Baseline: EVAL: to be assessed next visit: 08/23/2022= 36/56; 11/03/2022= 40/56 Goal status: MET  5.  Pt will decrease TUG to below 14 seconds/decrease in order to demonstrate decreased fall risk. Baseline: EVAL: 22.00 sec with upright 4WW; 10/25/2022= 15.98 sec with 4WW 12/2: 16.40 sec; 03/29/2023=15.80 with upright 4WW Goal status: PROGRESSING  6.  Pt will increase by at least 53m (129ft) in order to demonstrate clinically significant improvement in cardiopulmonary endurance and  community ambulation  Baseline: EVAL: 3:45 min (450 feet with upright 4WW) 10/25/2022= 830 feet with 4WW Goal status: MET  7.   Patient will report returning to walking in to community places using most appropriate assistive device vs current use of scooter for improved abilities with mobility during community outings Baseline: EVAL: Current using scooter for many social/community outings; 10/25/2022- Paitent continues to scooter for long distance community distances- due to some fatigue or SOB.  12/2: Only uses scooter when he has to ge tin the yard, does not use AD in home or garage majority of the time. Goal status: PROGRESSING  8.  Pt will increase > 1000 feet in order to demonstrate clinically significant improvement in cardiopulmonary  endurance and community ambulation   Baseline: 10/25/2022= 830 feet with 4WW 12/2: 919 feet with 4WW;  03/30/2023= 988 feet with upright 4WW  Goal status= PROGRESSING  9.  Pt will improve BERG by at least 3 points in order to demonstrate clinically significant improvement in balance.   Baseline: 11/03/2022= 40/56 12/2: 44/56 Goal status: MET    10. Pt will transition into gym-based exercise routine upon discharge in order to maintain progress of strength and balance  Baseline: 01/31/2023: Future interventions will involve therex in [facility] to prescribe pt proper exercise routine upon discharge; 03/29/2023= Patient able to perform some gym based exercises today but limited secondary to some Right hip pain.  Goal Status: ONGOING  ASSESSMENT:  CLINICAL IMPRESSION:   Treatment today focused on reviewing some gym exercises for expected transition from skilled PT to self care and home management. Overall- he was able to safely navigate in gym using his walker and safely use the equipment with some intermittent assist from either PT or his wife to [employer] or weights. He had some difficulty negotiating Nustep including turning the seat and positioning his  legs. Patient will continue to benefit from skilled PT services to address deficits and impairment identified in evaluation in order to maximize independence and safety in basic mobility required for performance of ADL, IADL, and leisure.    OBJECTIVE IMPAIRMENTS: Abnormal gait, cardiopulmonary status limiting activity, decreased activity tolerance, decreased balance, decreased coordination, decreased endurance, decreased mobility, difficulty walking, decreased ROM, decreased strength, hypomobility, impaired flexibility, impaired sensation, impaired UE functional use, postural dysfunction, obesity, and pain.   ACTIVITY LIMITATIONS: carrying, lifting, bending, standing, squatting, stairs, transfers, continence, and reach over head  PARTICIPATION LIMITATIONS: meal prep, cleaning, laundry, driving, shopping, community activity, and yard work  PERSONAL FACTORS: Age and 3+ comorbidities: HTN, Arthritis, Low back pain, Trigeminal Neuralgia  are also affecting patient's functional outcome.   REHAB POTENTIAL: Good  CLINICAL DECISION MAKING: Evolving/moderate complexity  EVALUATION COMPLEXITY: Moderate  PLAN:  PT FREQUENCY: 1-2x/week  PT DURATION: 12 weeks  PLANNED INTERVENTIONS: Therapeutic exercises, Therapeutic activity, Neuromuscular re-education, Balance training, Gait training, Patient/Family education, Self Care, Joint mobilization, Stair training, Vestibular training, Canalith repositioning, DME instructions, Dry Needling, Electrical stimulation, Spinal manipulation, Spinal mobilization, Cryotherapy, Moist heat, Manual therapy, and Re-evaluation  PLAN FOR NEXT SESSION: Progress therex and balance training. Continue with  training in [facility] for UE/LE strengthening and cardiovascular training.    1:04 PM, 04/18/23  Louis Meckel, PT Physical Therapist - [facility]  [phone] [facility])    1:04 PM, 04/18/23

## 2023-04-19 ENCOUNTER — Ambulatory Visit: Payer: Medicare Other

## 2023-04-19 DIAGNOSIS — R2689 Other abnormalities of gait and mobility: Secondary | ICD-10-CM

## 2023-04-19 DIAGNOSIS — G8929 Other chronic pain: Secondary | ICD-10-CM

## 2023-04-19 DIAGNOSIS — R262 Difficulty in walking, not elsewhere classified: Secondary | ICD-10-CM

## 2023-04-19 DIAGNOSIS — R2681 Unsteadiness on feet: Secondary | ICD-10-CM

## 2023-04-19 DIAGNOSIS — R269 Unspecified abnormalities of gait and mobility: Secondary | ICD-10-CM

## 2023-04-19 DIAGNOSIS — M6281 Muscle weakness (generalized): Secondary | ICD-10-CM

## 2023-04-19 DIAGNOSIS — R278 Other lack of coordination: Secondary | ICD-10-CM

## 2023-04-19 NOTE — Therapy (Signed)
OUTPATIENT PHYSICAL THERAPY TREATMENT   Patient Name: [name] MRN: [id number] DOB:August 17, 1945, 78 y.o., male Today's Date: 04/20/2023   PCP: Ardyth Man, PA- C REFERRING PROVIDER: Morene Crocker, MD  END OF SESSION:  PT End of Session - 04/19/23 1200     Visit Number 56    Number of Visits 63    Date for PT Re-Evaluation 04/25/23    Authorization Type [employer]    Progress Note Due on Visit 60    PT Start Time 1154    PT Stop Time 1233    PT Time Calculation (min) 39 min    Equipment Utilized During Treatment Gait belt    Activity Tolerance Patient tolerated treatment well    Behavior During Therapy WFL for tasks assessed/performed                      Past Medical History:  Diagnosis Date   Anxiety    Arteriosclerosis of coronary artery 11/16/2011   Overview:  Stent 10/2011 stent rca 2015 with collaterals to lad which is chronically occluded    Benign enlargement of prostate    Benign essential HTN 06/11/2014   Benign prostatic hypertrophy without urinary obstruction 07/31/2014   Bilateral cataracts 05/16/2013   Overview:  [facility]     Bone spur of foot    Left   BP (high blood pressure) 11/09/2012   Cancer (HCC)    skin (forehead) and bladder   Carotid artery narrowing 02/08/2014   Depression    Detrusor hypertrophy    Diabetes (HCC)    Diabetes mellitus, type 2 (HCC) 12/12/2012   Diverticulosis    Dyspnea    Esophageal reflux    Esophageal reflux    Fothergill's neuralgia 08/07/2012   Overview:  [facility]    Gastritis    GERD (gastroesophageal reflux disease)    Headache    cluster headaches   Healed myocardial infarct 11/09/2012   Hearing loss in left ear    Heart disease    Hematuria    Hemorrhoids    History of hiatal hernia 12/14/2017   small    Hypercholesteremia    Lesion of bladder    Myocardial infarct (HCC)    Presence of stent in coronary artery 11/09/2012   Rectal bleeding    Trigeminal  neuralgia    Trigeminal neuralgia    Valvular heart disease    Vitamin D deficiency    Past Surgical History:  Procedure Laterality Date   APPENDECTOMY     BOTOX INJECTION N/A 12/21/2017   Procedure: Bladder BOTOX INJECTION;  Surgeon: Vanna Scotland, MD;  Location: [facility] ORS;  Service: Urology;  Laterality: N/A;   BOTOX INJECTION N/A 09/11/2018   Procedure: Bladder BOTOX INJECTION;  Surgeon: Vanna Scotland, MD;  Location: [facility] ORS;  Service: Urology;  Laterality: N/A;   CARDIAC CATHETERIZATION     CARDIAC CATHETERIZATION N/A 01/22/2015   Procedure: Left Heart Cath;  Surgeon: Lamar Blinks, MD;  Location: [facility] INVASIVE CV LAB;  Service: Cardiovascular;  Laterality: N/A;   CARDIAC CATHETERIZATION N/A 01/22/2015   Procedure: Coronary Stent Intervention;  Surgeon: Marcina Millard, MD;  Location: [facility] INVASIVE CV LAB;  Service: Cardiovascular;  Laterality: N/A;   CATARACT EXTRACTION, BILATERAL     COLONOSCOPY WITH PROPOFOL N/A 12/08/2015   Procedure: COLONOSCOPY WITH PROPOFOL;  Surgeon: Christena Deem, MD;  Location: [facility] ENDOSCOPY;  Service: Endoscopy;  Laterality: N/A;   COLONOSCOPY WITH PROPOFOL N/A 12/09/2015  Procedure: COLONOSCOPY WITH PROPOFOL;  Surgeon: Christena Deem, MD;  Location: [facility] ENDOSCOPY;  Service: Endoscopy;  Laterality: N/A;   CORONARY ANGIOPLASTY     5 stents   CORONARY STENT PLACEMENT  2015   x5   CYSTOSCOPY N/A 09/11/2018   Procedure: CYSTOSCOPY;  Surgeon: Vanna Scotland, MD;  Location: [facility] ORS;  Service: Urology;  Laterality: N/A;   CYSTOSCOPY WITH BIOPSY N/A 12/21/2017   Procedure: CYSTOSCOPY WITH BIOPSY;  Surgeon: Vanna Scotland, MD;  Location: [facility] ORS;  Service: Urology;  Laterality: N/A;   ESOPHAGOGASTRODUODENOSCOPY (EGD) WITH PROPOFOL N/A 12/08/2015   Procedure: ESOPHAGOGASTRODUODENOSCOPY (EGD) WITH PROPOFOL;  Surgeon: Christena Deem, MD;  Location[facility];  Service: Endoscopy;  Laterality: N/A;   ESOPHAGOGASTRODUODENOSCOPY (EGD) WITH  PROPOFOL N/A 12/13/2017   Procedure: ESOPHAGOGASTRODUODENOSCOPY (EGD) WITH PROPOFOL;  Surgeon: Christena Deem, MD;  Location: [facility] ENDOSCOPY;  Service: Endoscopy;  Laterality: N/A;   EYE SURGERY     HERNIA REPAIR     kidney tumor remove     TRANSURETHRAL RESECTION OF BLADDER TUMOR WITH GYRUS (TURBT-GYRUS)  12/2013   UMBILICAL HERNIA REPAIR     urethral meatotomy     Patient Active Problem List   Diagnosis Date Noted   Class 2 obesity due to excess calories with body mass index (BMI) of 36.0 to 36.9 in adult 04/11/2020   Swelling of limb 03/28/2020   Lymphedema 03/28/2020   Trigeminal neuralgia 12/25/2019   Lower limb ulcer, calf, left, limited to breakdown of skin (HCC) 12/25/2019   OSA (obstructive sleep apnea) 01/23/2019   Acute systolic CHF (congestive heart failure) (HCC) 12/12/2018   Bruising 07/10/2018   Diet-controlled type 2 diabetes mellitus (HCC) 03/24/2018   Microalbuminuria 03/24/2018   Dizziness 11/17/2017   Simple chronic bronchitis (HCC) 09/22/2017   SOBOE (shortness of breath on exertion) 02/18/2017   Chronic midline low back pain without sciatica 12/29/2016   Periodic limb movement disorder 12/29/2016   Benign essential tremor 06/22/2016   Pure hypercholesterolemia 06/20/2015   Erectile dysfunction due to arterial insufficiency 06/16/2015   Unstable angina (HCC) 01/22/2015   Abdominal aortic 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 rupture (HCC) 12/31/2014   Benign prostatic hyperplasia without urinary obstruction 07/31/2014   Enlarged prostate 07/31/2014   Benign essential HTN 06/11/2014   Carotid artery narrowing 02/08/2014   Carotid artery obstruction 02/08/2014   Bilateral carotid artery stenosis 02/08/2014   Chest pain 08/20/2013   Bilateral cataracts 05/16/2013   Cataract 05/16/2013   Clinical depression 12/12/2012   Diabetes mellitus, type 2 (HCC) 12/12/2012   Combined fat and carbohydrate induced hyperlipemia 12/12/2012   Major depressive disorder, single  episode, unspecified 12/12/2012   Acid reflux 11/09/2012   Presence of stent in coronary artery 11/09/2012   BP (high blood pressure) 11/09/2012   Healed myocardial infarct 11/09/2012   Gastro-esophageal reflux disease without esophagitis 11/09/2012   History of cardiovascular surgery 11/09/2012   Fothergill's neuralgia 08/07/2012   Swelling of testicle 04/06/2012   Disorder of male genital organ 04/06/2012   Swelling of the testicles 04/06/2012   Arteriosclerosis of coronary artery 11/16/2011   CAD in native artery 11/16/2011   Fatigue 06/04/2011   Avitaminosis D 11/30/2010    ONSET DATE: 03/17/2022- Worsening symptoms  REFERRING DIAG:  G20.A1 (ICD-10-CM) - Parkinsons  R26.2 (ICD-10-CM) - Difficulty walking    THERAPY DIAG:  Muscle weakness (generalized)  Difficulty in walking, not elsewhere classified  Unsteadiness on feet  Chronic bilateral low back pain without sciatica  Other abnormalities of gait and mobility  Abnormality of gait and mobility  Other lack of coordination  Rationale for Evaluation and Treatment: Rehabilitation  SUBJECTIVE:                                                                                                                                                                                             SUBJECTIVE STATEMENT:   Patient reports running behind due to MD appointment.     Pt accompanied by: significant other  PERTINENT HISTORY: Patient is a 78 year old male with referral to outpatient PT for Parkinsons and difficulty walking. He was recently seen by Neurology worsening symptoms of parkinson- difficulty with walking. He has past medical history of Arthritis, Bladder cancer, and Trigeminal Neuralgia.  PAIN:  Are you having pain? Yes 4/10 low back   PRECAUTIONS: Fall  WEIGHT BEARING RESTRICTIONS: No  FALLS: Has patient fallen in last 6 months? Yes. Number of falls 1  LIVING ENVIRONMENT: Lives with: lives with their  spouse Lives in: House/apartment Stairs: Yes: External: 2 steps; has grab bar Has following equipment at home: Dan Humphreys - 2 wheeled, Environmental consultant - 4 wheeled, and Grab bars  PLOF: Independent with household mobility with device, Independent with community mobility with device, Independent with transfers, and Requires assistive device for independence  PATIENT GOALS: Get to where I can walk better again and get my legs stronger.   OBJECTIVE:    TODAY'S TREATMENT:                                                                                                                              DATE: 04/20/23     Self care/Home management: Review of exercises to assist with symptoms of Parkinsons that can be performed in the [facility] gym- Group room with mirrors   Standing hip abd 2 sets of 10 reps Holding onto back of sturdy chair Standing hip ext- 2 sets of 10 reps Farmers carry with 15# KB each arm x 40 feet x 2 trials Sit to stand holding onto 3kg ball 2 x 10 reps reps Standing at wall -  Reaching UE overhead (focusing on thoracic ext) 2 sets of 10 reps Dynamic lateral step to left and right with extending Arm out to side - 2 sets of 10 reps    PATIENT EDUCATION: Education details: Exercise technique; gym exercises Person educated: Patient Education method: Explanation, Demonstration, Tactile cues, and Verbal cues Education comprehension: verbalized understanding, returned demonstration, verbal cues required, tactile cues required, and needs further education  HOME EXERCISE PROGRAM: Access Code: [email or web address] URL: [email or web address] Date: 08/11/2022 Prepared by: Maureen Ralphs  Exercises - Seated Lumbar Flexion Stretch  - 7 x weekly - 3 sets - 30 sec hold - Seated Thoracic Flexion and Rotation with Arms Crossed  - 1 x daily - 7 x weekly - 3 sets - 30 sec hold - Seated Figure 4 Piriformis Stretch  - 1 x daily - 7 x weekly - 3 sets - 10 reps  GOALS: Goals reviewed with  patient? Yes  SHORT TERM GOALS: Target date: 09/20/2022  Pt will be independent with a comprehensive HEP in order to improve strength and balance in order to decrease fall risk and improve function at home and work.  Baseline: EVAL: Patient admits not performing much exercises - not moving around well. 10/25/2022-Patient reports not as compliant with previous HEP as instructed- Verbally reviewed Low back, cervical, thoracic stretching and importance of daily walking. 11/03/2022= Patient reports independent with cervical Stretches and some walking at home Goal status: MET   LONG TERM GOALS: Target date: 04/25/2023  Pt will improve FOTO to target score of 45 % to display perceived improvements in ability to complete ADL's.  Baseline: EVAL: 42: 11/03/2022= 55 Goal status: MET  2.  Pt will decrease 5TSTS by at least 5 seconds in order to demonstrate clinically significant improvement in LE strength.  Baseline: EVAL = 17.39 sec without UE support; 10/25/2022= 12.91 sec 12/2: 12.02 sec Goal status: MET  3. Pt will increase by at least 0.15 m/s in order to demonstrate clinically significant improvement in community ambulation   Baseline: EVAL= 0.67 m/s; 10/25/2022= 0.77 m/s 12/2: 0.84 m/s with 4WW Goal status: MET  4.  Pt will improve BERG by at least 3 points in order to demonstrate clinically significant improvement in balance.   Baseline: EVAL: to be assessed next visit: 08/23/2022= 36/56; 11/03/2022= 40/56 Goal status: MET  5.  Pt will decrease TUG to below 14 seconds/decrease in order to demonstrate decreased fall risk. Baseline: EVAL: 22.00 sec with upright 4WW; 10/25/2022= 15.98 sec with 4WW 12/2: 16.40 sec; 03/29/2023=15.80 with upright 4WW Goal status: PROGRESSING  6.  Pt will increase by at least 28m (180ft) in order to demonstrate clinically significant improvement in cardiopulmonary endurance and community ambulation  Baseline: EVAL: 3:45 min (450 feet with upright 4WW)  10/25/2022= 830 feet with 4WW Goal status: MET  7.   Patient will report returning to walking in to community places using most appropriate assistive device vs current use of scooter for improved abilities with mobility during community outings Baseline: EVAL: Current using scooter for many social/community outings; 10/25/2022- Paitent continues to scooter for long distance community distances- due to some fatigue or SOB.  12/2: Only uses scooter when he has to ge tin the yard, does not use AD in home or garage majority of the time. Goal status: PROGRESSING  8.  Pt will increase > 1000 feet in order to demonstrate clinically significant improvement in cardiopulmonary endurance and community ambulation   Baseline: 10/25/2022= 830 feet with 4WW 12/2: 919 feet  with 4WW;  03/30/2023= 988 feet with upright 4WW  Goal status= PROGRESSING  9.  Pt will improve BERG by at least 3 points in order to demonstrate clinically significant improvement in balance.   Baseline: 11/03/2022= 40/56 12/2: 44/56 Goal status: MET    10. Pt will transition into gym-based exercise routine upon discharge in order to maintain progress of strength and balance  Baseline: 01/31/2023: Future interventions will involve therex in [name] to prescribe pt proper exercise routine upon discharge; 03/29/2023= Patient able to perform some gym based exercises today but limited secondary to some Right hip pain.  Goal Status: ONGOING  ASSESSMENT:  CLINICAL IMPRESSION:   Treatment continued per plan of care- focusing of another part of possible gym workout for parkinsons symptoms utilizing group room with mirrors, chair/wall. Patient performed some LE strengthening and some balance/functional activities that he has done previously in PT sessions. He responded well to all activities. He will benefit from 1 last session to perform final review of HEP and then plan to discharge to gym based setting in [facility] here at [facility].    OBJECTIVE  IMPAIRMENTS: Abnormal gait, cardiopulmonary status limiting activity, decreased activity tolerance, decreased balance, decreased coordination, decreased endurance, decreased mobility, difficulty walking, decreased ROM, decreased strength, hypomobility, impaired flexibility, impaired sensation, impaired UE functional use, postural dysfunction, obesity, and pain.   ACTIVITY LIMITATIONS: carrying, lifting, bending, standing, squatting, stairs, transfers, continence, and reach over head  PARTICIPATION LIMITATIONS: meal prep, cleaning, laundry, driving, shopping, community activity, and yard work  PERSONAL FACTORS: Age and 3+ comorbidities: HTN, Arthritis, Low back pain, Trigeminal Neuralgia  are also affecting patient's functional outcome.   REHAB POTENTIAL: Good  CLINICAL DECISION MAKING: Evolving/moderate complexity  EVALUATION COMPLEXITY: Moderate  PLAN:  PT FREQUENCY: 1-2x/week  PT DURATION: 12 weeks  PLANNED INTERVENTIONS: Therapeutic exercises, Therapeutic activity, Neuromuscular re-education, Balance training, Gait training, Patient/Family education, Self Care, Joint mobilization, Stair training, Vestibular training, Canalith repositioning, DME instructions, Dry Needling, Electrical stimulation, Spinal manipulation, Spinal mobilization, Cryotherapy, Moist heat, Manual therapy, and Re-evaluation  PLAN FOR NEXT SESSION: Plan for discharge/final review of HEP next session.   10:11 AM, 04/20/23  Louis Meckel, PT Physical Therapist - [facility]   10:11 AM, 04/20/23

## 2023-04-20 ENCOUNTER — Telehealth: Payer: Self-pay

## 2023-04-20 ENCOUNTER — Ambulatory Visit: Payer: Medicare Other

## 2023-04-20 NOTE — Telephone Encounter (Addendum)
[name] called in from [facility] to check the status of the referral. I inform him that the referral was received, and he has appointment with Dr. Tobi Bastos for tomorrow. It may change depending on the weather.

## 2023-04-21 ENCOUNTER — Ambulatory Visit: Payer: Medicare HMO | Admitting: Gastroenterology

## 2023-04-24 NOTE — Therapy (Signed)
 OUTPATIENT PHYSICAL THERAPY TREATMENT/PT DISCHARGE SUMMARY   Patient Name: [name] MRN: [id number] DOB:[birth year], 78 y.o., male Today's Date: 04/25/2023   PCP: Ardyth Man, PA- C REFERRING PROVIDER: Morene Crocker, MD  END OF SESSION:  PT End of Session - 04/25/23 1115     Visit Number 57    Number of Visits 63    Date for PT Re-Evaluation 04/25/23    Authorization Type [employer]    Progress Note Due on Visit 60    PT Start Time 1101    PT Stop Time 1144    PT Time Calculation (min) 43 min    Equipment Utilized During Treatment Gait belt    Activity Tolerance Patient tolerated treatment well    Behavior During Therapy WFL for tasks assessed/performed                       Past Medical History:  Diagnosis Date   Anxiety    Arteriosclerosis of coronary artery 11/16/2011   Overview:  Stent 10/2011 stent rca 2015 with collaterals to lad which is chronically occluded    Benign enlargement of prostate    Benign essential HTN 06/11/2014   Benign prostatic hypertrophy without urinary obstruction 07/31/2014   Bilateral cataracts 05/16/2013   Overview:  [facility]     Bone spur of foot    Left   BP (high blood pressure) 11/09/2012   Cancer (HCC)    skin (forehead) and bladder   Carotid artery narrowing 02/08/2014   Depression    Detrusor hypertrophy    Diabetes (HCC)    Diabetes mellitus, type 2 (HCC) 12/12/2012   Diverticulosis    Dyspnea    Esophageal reflux    Esophageal reflux    Fothergill's neuralgia 08/07/2012   Overview:  [facility]    Gastritis    GERD (gastroesophageal reflux disease)    Headache    cluster headaches   Healed myocardial infarct 11/09/2012   Hearing loss in left ear    Heart disease    Hematuria    Hemorrhoids    History of hiatal hernia 12/14/2017   small    Hypercholesteremia    Lesion of bladder    Myocardial infarct (HCC)    Presence of stent in coronary artery 11/09/2012   Rectal bleeding     Trigeminal neuralgia    Trigeminal neuralgia    Valvular heart disease    Vitamin D deficiency    Past Surgical History:  Procedure Laterality Date   APPENDECTOMY     BOTOX INJECTION N/A 12/21/2017   Procedure: Bladder BOTOX INJECTION;  Surgeon: Vanna Scotland, MD;  Location: [facility] ORS;  Service: Urology;  Laterality: N/A;   BOTOX INJECTION N/A 09/11/2018   Procedure: Bladder BOTOX INJECTION;  Surgeon: Vanna Scotland, MD;  Location: [facility];  Service: Urology;  Laterality: N/A;   CARDIAC CATHETERIZATION     CARDIAC CATHETERIZATION N/A 01/22/2015   Procedure: Left Heart Cath;  Surgeon: Lamar Blinks, MD;  Location: [facility] INVASIVE CV LAB;  Service: Cardiovascular;  Laterality: N/A;   CARDIAC CATHETERIZATION N/A 01/22/2015   Procedure: Coronary Stent Intervention;  Surgeon: Marcina Millard, MD;  Location: [facility] INV[facility] CV [facility];  Service: Cardiovascular;  Laterality: N/A;   CATARACT EXTRACTION, BILATERAL     COLONOSCOPY WITH PROPOFOL N/A 12/08/2015   Procedure: COLONOSCOPY WITH PROPOFOL;  Surgeon: Christena Deem, MD;  Location: [facility] ENDOSCOPY;  Service: Endoscopy;  Laterality: N/A;   COLONOSCOPY WITH PROPOFOL  N/A 12/09/2015   Procedure: COLONOSCOPY WITH PROPOFOL;  Surgeon: Christena Deem, MD;  Location: [facility] ENDOSCOPY;  Service: Endoscopy;  Laterality: N/A;   CORONARY ANGIOPLASTY     5 stents   CORONARY STENT PLACEMENT  2015   x5   CYSTOSCOPY N/A 09/11/2018   Procedure: CYSTOSCOPY;  Surgeon: Vanna Scotland, MD;  Location: [facility] ORS;  Service: Urology;  Laterality: N/A;   CYSTOSCOPY WITH BIOPSY N/A 12/21/2017   Procedure: CYSTOSCOPY WITH BIOPSY;  Surgeon: Vanna Scotland, MD;  Location: [facility] ORS;  Service: Urology;  Laterality: N/A;   ESOPHAGOGASTRODUODENOSCOPY (EGD) WITH PROPOFOL N/A 12/08/2015   Procedure: ESOPHAGOGASTRODUODENOSCOPY (EGD) WITH PROPOFOL;  Surgeon: Christena Deem, MD;  Location: [facility] ENDOSCOPY;  Service: Endoscopy;  Laterality: N/A;    ESOPHAGOGASTRODUODENOSCOPY (EGD) WITH PROPOFOL N/A 12/13/2017   Procedure: ESOPHAGOGASTRODUODENOSCOPY (EGD) WITH PROPOFOL;  Surgeon: Christena Deem, MD;  Location: [facility] ENDOSCOPY;  Service: Endoscopy;  Laterality: N/A;   EYE SURGERY     HERNIA REPAIR     kidney tumor remove     TRANSURETHRAL RESECTION OF BLADDER TUMOR WITH GYRUS (TURBT-GYRUS)  12/2013   UMBILICAL HERNIA REPAIR     urethral meatotomy     Patient Active Problem List   Diagnosis Date Noted   Class 2 obesity due to excess calories with body mass index (BMI) of 36.0 to 36.9 in adult 04/11/2020   Swelling of limb 03/28/2020   Lymphedema 03/28/2020   Trigeminal neuralgia 12/25/2019   Lower limb ulcer, calf, left, limited to breakdown of skin (HCC) 12/25/2019   OSA (obstructive sleep apnea) 01/23/2019   Acute systolic CHF (congestive heart failure) (HCC) 12/12/2018   Bruising 07/10/2018   Diet-controlled type 2 diabetes mellitus (HCC) 03/24/2018   Microalbuminuria 03/24/2018   Dizziness 11/17/2017   Simple chronic bronchitis (HCC) 09/22/2017   SOBOE (shortness of breath on exertion) 02/18/2017   Chronic midline low back pain without sciatica 12/29/2016   Periodic limb movement disorder 12/29/2016   Benign essential tremor 06/22/2016   Pure hypercholesterolemia 06/20/2015   Erectile dysfunction due to arterial insufficiency 06/16/2015   Unstable angina (HCC) 01/22/2015   Abdominal aortic 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 rupture (HCC) 12/31/2014   Benign prostatic hyperplasia without urinary obstruction 07/31/2014   Enlarged prostate 07/31/2014   Benign essential HTN 06/11/2014   Carotid artery narrowing 02/08/2014   Carotid artery obstruction 02/08/2014   Bilateral carotid artery stenosis 02/08/2014   Chest pain 08/20/2013   Bilateral cataracts 05/16/2013   Cataract 05/16/2013   Clinical depression 12/12/2012   Diabetes mellitus, type 2 (HCC) 12/12/2012   Combined fat and carbohydrate induced hyperlipemia 12/12/2012    Major depressive disorder, single episode, unspecified 12/12/2012   Acid reflux 11/09/2012   Presence of stent in coronary artery 11/09/2012   BP (high blood pressure) 11/09/2012   Healed myocardial infarct 11/09/2012   Gastro-esophageal reflux disease without esophagitis 11/09/2012   History of cardiovascular surgery 11/09/2012   Fothergill's neuralgia 08/07/2012   Swelling of testicle 04/06/2012   Disorder of male genital organ 04/06/2012   Swelling of the testicles 04/06/2012   Arteriosclerosis of coronary artery 11/16/2011   CAD in native artery 11/16/2011   Fatigue 06/04/2011   Avitaminosis D 11/30/2010    ONSET DATE: 03/17/2022- Worsening symptoms  REFERRING DIAG:  G20.A1 (ICD-10-CM) - Parkinsons  R26.2 (ICD-10-CM) - Difficulty walking    THERAPY DIAG:  Muscle weakness (generalized)  Difficulty in walking, not elsewhere classified  Unsteadiness on feet  Chronic bilateral low back pain without sciatica  Other abnormalities  of gait and mobility  Abnormality of gait and mobility  Other lack of coordination  Rationale for Evaluation and Treatment: Rehabilitation  SUBJECTIVE:                                                                                                                                                                                             SUBJECTIVE STATEMENT:   Patient reports minor soreness but otherwise doing okay. States sad about being last PT day.     Pt accompanied by: significant other  PERTINENT HISTORY: Patient is a 78 year old male with referral to outpatient PT for Parkinsons and difficulty walking. He was recently seen by Neurology worsening symptoms of parkinson- difficulty with walking. He has past medical history of Arthritis, Bladder cancer, and Trigeminal Neuralgia.  PAIN:  Are you having pain? Yes 4/10 low back   PRECAUTIONS: Fall  WEIGHT BEARING RESTRICTIONS: No  FALLS: Has patient fallen in last 6 months? Yes.  Number of falls 1  LIVING ENVIRONMENT: Lives with: lives with their spouse Lives in: House/apartment Stairs: Yes: External: 2 steps; has grab bar Has following equipment at home: Dan Humphreys - 2 wheeled, Environmental consultant - 4 wheeled, and Grab bars  PLOF: Independent with household mobility with device, Independent with community mobility with device, Independent with transfers, and Requires assistive device for independence  PATIENT GOALS: Get to where I can walk better again and get my legs stronger.   OBJECTIVE:    TODAY'S TREATMENT:                                                                                                                              DATE: 04/25/23      Physical therapy treatment session today consisted of completing assessment of goals and administration of testing as demonstrated and documented in flow sheet, treatment, and goals section of this note. Addition treatments may be found below.   6 Min Walk Test:  Instructed patient to ambulate as quickly and as safely as possible for 6 minutes using LRAD. Patient was allowed to take standing rest  breaks without stopping the test, but if the patient required a sitting rest break the clock would be stopped and the test would be over.  Results: 1100 feet (335 meters) using a 4WW with CGA. Results indicate that the patient has reduced endurance with ambulation compared to age matched norms.  Age Matched Norms: 68-79 yo M: 527 MDC: 58.21 meters (190.98 feet) or 50 meters (ANPTA Core Set of Outcome Measures for Adults with Neurologic Conditions, 2018)   Timed up and go test: 1) 14.18 sec using 1OX    Self care/Home management: Review of exercises to assist with symptoms of Parkinsons that can be performed in the well zone gym-Final review of any machines that he stated were "hard to remember or hard to do Verbal review of all activities that can be performed in gym- including several pieces of equipment and use of group room for  more hip and balance activities.  Seated knee ext- plate 2 - 3 sets of 10 reps Seated knee flex- plate 3- 3 sets of 10 reps Thoracic trunk twist Plate 3 2 sets of 10 reps    PATIENT EDUCATION: Education details: Exercise technique; gym exercises Person educated: Patient Education method: Explanation, Demonstration, Tactile cues, and Verbal cues Education comprehension: verbalized understanding, returned demonstration, verbal cues required, tactile cues required, and needs further education  HOME EXERCISE PROGRAM: Access Code: [email or web address] URL: [email or web address] Date: 08/11/2022 Prepared by: Maureen Ralphs  Exercises - Seated Lumbar Flexion Stretch  - 7 x weekly - 3 sets - 30 sec hold - Seated Thoracic Flexion and Rotation with Arms Crossed  - 1 x daily - 7 x weekly - 3 sets - 30 sec hold - Seated Figure 4 Piriformis Stretch  - 1 x daily - 7 x weekly - 3 sets - 10 reps  GOALS: Goals reviewed with patient? Yes  SHORT TERM GOALS: Target date: 09/20/2022  Pt will be independent with a comprehensive HEP in order to improve strength and balance in order to decrease fall risk and improve function at home and work.  Baseline: EVAL: Patient admits not performing much exercises - not moving around well. 10/25/2022-Patient reports not as compliant with previous HEP as instructed- Verbally reviewed Low back, cervical, thoracic stretching and importance of daily walking. 11/03/2022= Patient reports independent with cervical Stretches and some walking at home Goal status: MET   LONG TERM GOALS: Target date: 04/25/2023  Pt will improve FOTO to target score of 45 % to display perceived improvements in ability to complete ADL's.  Baseline: EVAL: 42: 11/03/2022= 55 Goal status: MET  2.  Pt will decrease 5TSTS by at least 5 seconds in order to demonstrate clinically significant improvement in LE strength.  Baseline: EVAL = 17.39 sec without UE support; 10/25/2022= 12.91 sec 12/2: 12.02  sec Goal status: MET  3. Pt will increase by at least 0.15 m/s in order to demonstrate clinically significant improvement in community ambulation   Baseline: EVAL= 0.67 m/s; 10/25/2022= 0.77 m/s 12/2: 0.84 m/s with 4WW Goal status: MET  4.  Pt will improve BERG by at least 3 points in order to demonstrate clinically significant improvement in balance.   Baseline: EVAL: to be assessed next visit: 08/23/2022= 36/56; 11/03/2022= 40/56 Goal status: MET  5.  Pt will decrease TUG to below 14 seconds/decrease in order to demonstrate decreased fall risk. Baseline: EVAL: 22.00 sec with upright 4WW; 10/25/2022= 15.98 sec with 4WW 12/2: 16.40 sec; 03/29/2023=15.80 with upright 4WW; 04/26/2023 = 14.18 sec with 4WW Goal  status: PROGRESSING- Much improved but just shy of goal  6.  Pt will increase by at least 39m (178ft) in order to demonstrate clinically significant improvement in cardiopulmonary endurance and community ambulation  Baseline: EVAL: 3:45 min (450 feet with upright 4WW) 10/25/2022= 830 feet with 4WW Goal status: MET  7.   Patient will report returning to walking in to community places using most appropriate assistive device vs current use of scooter for improved abilities with mobility during community outings Baseline: EVAL: Current using scooter for many social/community outings; 10/25/2022- Paitent continues to scooter for long distance community distances- due to some fatigue or SOB.  12/2: Only uses scooter when he has to ge tin the yard, does not use AD in home or garage majority of the time. 04/25/2023= Patient reports either pushing a cart or using his 4WW.  Goal status: MET  8.  Pt will increase > 1000 feet in order to demonstrate clinically significant improvement in cardiopulmonary endurance and community ambulation   Baseline: 10/25/2022= 830 feet with 4WW 12/2: 919 feet with 4WW;  03/30/2023= 988 feet with upright 4WW; 04/25/2023= 1100 feet  with 4WW  Goal  status=MET  9.  Pt will improve BERG by at least 3 points in order to demonstrate clinically significant improvement in balance.   Baseline: 11/03/2022= 40/56 12/2: 44/56 Goal status: MET    10. Pt will transition into gym-based exercise routine upon discharge in order to maintain progress of strength and balance  Baseline: 01/31/2023: Future interventions will involve therex in [facility] to prescribe pt proper exercise routine upon discharge; 03/29/2023= Patient able to perform some gym based exercises today but limited secondary to some Right hip pain. 04/25/2023= Patient able to review some exercises/machines that he considered challenging and demonstrated good independence including set up and performance. Goal Status: MET  ASSESSMENT:  CLINICAL IMPRESSION:   Patient presented with great motivation for his last scheduled visits. He retested remaining long term goals and demonstrated good progress overall- able to meet his 6 min walk test using his 4WW today and improved his TUG score Indicating decreased overall risk of falling. He also demo good understanding of gym exercises that can help him with his ROM/strength/balance and endurance with Parkinsons symptoms. He is appropriate for discharge today with majority of goals met and much improved overall mobility.    OBJECTIVE IMPAIRMENTS: Abnormal gait, cardiopulmonary status limiting activity, decreased activity tolerance, decreased balance, decreased coordination, decreased endurance, decreased mobility, difficulty walking, decreased ROM, decreased strength, hypomobility, impaired flexibility, impaired sensation, impaired UE functional use, postural dysfunction, obesity, and pain.   ACTIVITY LIMITATIONS: carrying, lifting, bending, standing, squatting, stairs, transfers, continence, and reach over head  PARTICIPATION LIMITATIONS: meal prep, cleaning, laundry, driving, shopping, community activity, and yard work  PERSONAL FACTORS: Age and 3+  comorbidities: HTN, Arthritis, Low back pain, Trigeminal Neuralgia  are also affecting patient's functional outcome.   REHAB POTENTIAL: Good  CLINICAL DECISION MAKING: Evolving/moderate complexity  EVALUATION COMPLEXITY: Moderate  PLAN:  PT FREQUENCY: 1-2x/week  PT DURATION: 12 weeks  PLANNED INTERVENTIONS: Therapeutic exercises, Therapeutic activity, Neuromuscular re-education, Balance training, Gait training, Patient/Family education, Self Care, Joint mobilization, Stair training, Vestibular training, Canalith repositioning, DME instructions, Dry Needling, Electrical stimulation, Spinal manipulation, Spinal mobilization, Cryotherapy, Moist heat, Manual therapy, and Re-evaluation  PLAN FOR NEXT SESSION: Discharge patient with goals met today.  3:12 PM, 04/25/23  Louis Meckel, PT Physical Therapist - [facility]   3:12 PM, 04/25/23

## 2023-04-25 ENCOUNTER — Ambulatory Visit: Payer: Medicare Other

## 2023-04-25 DIAGNOSIS — R2681 Unsteadiness on feet: Secondary | ICD-10-CM

## 2023-04-25 DIAGNOSIS — R262 Difficulty in walking, not elsewhere classified: Secondary | ICD-10-CM

## 2023-04-25 DIAGNOSIS — R2689 Other abnormalities of gait and mobility: Secondary | ICD-10-CM

## 2023-04-25 DIAGNOSIS — M6281 Muscle weakness (generalized): Secondary | ICD-10-CM

## 2023-04-25 DIAGNOSIS — R278 Other lack of coordination: Secondary | ICD-10-CM

## 2023-04-25 DIAGNOSIS — G8929 Other chronic pain: Secondary | ICD-10-CM

## 2023-04-25 DIAGNOSIS — R269 Unspecified abnormalities of gait and mobility: Secondary | ICD-10-CM

## 2023-04-27 ENCOUNTER — Ambulatory Visit: Payer: Medicare Other

## 2023-05-02 ENCOUNTER — Encounter: Payer: Self-pay | Admitting: Dermatology

## 2023-05-02 ENCOUNTER — Ambulatory Visit: Payer: Medicare Other

## 2023-05-02 ENCOUNTER — Ambulatory Visit: Payer: Medicare HMO | Admitting: Dermatology

## 2023-05-02 DIAGNOSIS — W908XXA Exposure to other nonionizing radiation, initial encounter: Secondary | ICD-10-CM | POA: Diagnosis not present

## 2023-05-02 DIAGNOSIS — L219 Seborrheic dermatitis, unspecified: Secondary | ICD-10-CM | POA: Diagnosis not present

## 2023-05-02 DIAGNOSIS — L570 Actinic keratosis: Secondary | ICD-10-CM | POA: Diagnosis not present

## 2023-05-02 NOTE — Progress Notes (Signed)
 Follow-Up Visit   Subjective  [name] is a 78 y.o. male who presents for the following: seb derm, using ketoconazole cr at face and clobetasol solution and ketoconazole shampoo at scalp. Itching improved.   AK follow up at face and scalp. Patient did well with LN2.  The patient has spots, moles and lesions to be evaluated, some may be new or changing and the patient may have concern these could be cancer.   The following portions of the chart were reviewed this encounter and updated as appropriate: medications, allergies, medical history  Review of Systems:  No other skin or systemic complaints except as noted in HPI or Assessment and Plan.  Objective  Well appearing patient in no apparent distress; mood and affect are within normal limits.   A focused examination was performed of the following areas: Face, scalp  Relevant exam findings are noted in the Assessment and Plan.  R Dass x 2, R temple x 1, R infraorbital x 1, vertex scalp x 6, L preauricular x 2, L Eaddy x 2, L superior forehead x 2 (16) Erythematous thin papules/macules with gritty scale.   Assessment & Plan   SEBORRHEIC DERMATITIS Exam: resolution of lesions on face and scalp  Chronic and persistent condition with duration or expected duration over one year. Condition is improving with treatment but not currently at goal.   Seborrheic Dermatitis is a chronic persistent rash characterized by pinkness and scaling most commonly of the mid face but also can occur on the scalp (dandruff), ears; mid chest, mid back and groin.  It tends to be exacerbated by stress and cooler weather.  People who have neurologic disease may experience new onset or exacerbation of existing seborrheic dermatitis.  The condition is not curable but treatable and can be controlled.  Treatment Plan: Continue ketoconazole 2% cr to face for maintenance.   Continue ketoconazole 2% shampoo 3 x weekly.   If flares, may restart HC 2.5% cr  to face and clobetasol solution to scalp BID as needed.    AK (ACTINIC KERATOSIS) (16) R Pall x 2, R temple x 1, R infraorbital x 1, vertex scalp x 6, L preauricular x 2, L Griffith x 2, L superior forehead x 2 (16) Actinic keratoses are precancerous spots that appear secondary to cumulative UV radiation exposure/sun exposure over time. They are chronic with expected duration over 1 year. A portion of actinic keratoses will progress to squamous cell carcinoma of the skin. It is not possible to reliably predict which spots will progress to skin cancer and so treatment is recommended to prevent development of skin cancer.  Recommend daily broad spectrum sunscreen SPF 30+ to sun-exposed areas, reapply every 2 hours as needed.  Recommend staying in the shade or wearing long sleeves, sun glasses (UVA+UVB protection) and wide brim hats (4-inch brim around the entire circumference of the hat). Call for new or changing lesions. Destruction of lesion - R Bondy x 2, R temple x 1, R infraorbital x 1, vertex scalp x 6, L preauricular x 2, L Brislin x 2, L superior forehead x 2 (16) Complexity: simple   Destruction method: cryotherapy   Informed consent: discussed and consent obtained   Timeout:  patient name, date of birth, surgical site, and procedure verified Lesion destroyed using liquid nitrogen: Yes   Region frozen until ice ball extended beyond lesion: Yes   Cryo cycles: 1 or 2. Outcome: patient tolerated procedure well with no complications   Post-procedure details:  wound care instructions given   SEBORRHEIC DERMATITIS   Related Medications ketoconazole (NIZORAL) 2 % cream Apply 1 Application topically 2 (two) times daily. Use as directed in print out hydrocortisone 2.5 % cream Apply to rash on face QD PRN.  Return in about 6 months (around 11/02/2023) for AK follow up.  Anise Salvo, RMA, am acting as scribe for Elie Goody, MD .   Documentation: I have reviewed the above  documentation for accuracy and completeness, and I agree with the above.  Elie Goody, MD

## 2023-05-02 NOTE — Patient Instructions (Addendum)
 Cryotherapy Aftercare  Wash gently with soap and water everyday.   Apply Vaseline and Band-Aid daily until healed.   Treatment Plan: Continue ketoconazole 2% cr to face for maintenance.   Continue ketoconazole 2% shampoo 3 x weekly.   If flares, may restart HC 2.5% cr to face and clobetasol solution to scalp as needed.   Due to recent changes in healthcare laws, you may see results of your pathology and/or laboratory studies on MyChart before the doctors have had a chance to review them. We understand that in some cases there may be results that are confusing or concerning to you. Please understand that not all results are received at the same time and often the doctors may need to interpret multiple results in order to provide you with the best plan of care or course of treatment. Therefore, we ask that you please give [place] 2 business days to thoroughly review all your results before contacting the office for clarification. Should we see a critical lab result, you will be contacted sooner.   If You Need Anything After Your Visit  If you have any questions or concerns for your doctor, please call our main line at [phone] and press option 4 to reach your doctor's medical assistant. If no one answers, please leave a voicemail as directed and we will return your call as soon as possible. Messages left after 4 pm will be answered the following business day.   You may also send [place] a message via MyChart. We typically respond to MyChart messages within 1-2 business days.  For prescription refills, please ask your pharmacy to contact our office. Our fax number is [phone].  If you have an urgent issue when the clinic is closed that cannot wait until the next business day, you can page your doctor at the number below.    Please note that while we do our best to be available for urgent issues outside of office hours, we are not available 24/7.   If you have an urgent issue and are unable to  reach [place], you may choose to seek medical care at your doctor's office, retail clinic, urgent care center, or emergency room.  If you have a medical emergency, please immediately call 911 or go to the emergency department.  Pager Numbers  - Dr. Gwen Pounds: [phone]  - Dr. Roseanne Reno: [phone]  - Dr. Katrinka Blazing: [phone]   In the event of inclement weather, please call our main line at [phone] for an update on the status of any delays or closures.  Dermatology Medication Tips: Please keep the boxes that topical medications come in in order to help keep track of the instructions about where and how to use these. Pharmacies typically print the medication instructions only on the boxes and not directly on the medication tubes.   If your medication is too expensive, please contact our office at [phone] option 4 or send [place] a message through MyChart.   We are unable to tell what your co-pay for medications will be in advance as this is different depending on your insurance coverage. However, we may be able to find a substitute medication at lower cost or fill out paperwork to get insurance to cover a needed medication.   If a prior authorization is required to get your medication covered by your insurance company, please allow [place] 1-2 business days to complete this process.  Drug prices often vary depending on where the prescription is filled and some pharmacies may offer cheaper prices.  The website www.goodrx.com contains coupons for medications through different pharmacies. The prices here do not account for what the cost may be with help from insurance (it may be cheaper with your insurance), but the website can give you the price if you did not use any insurance.  - You can print the associated coupon and take it with your prescription to the pharmacy.  - You may also stop by our office during regular business hours and pick up a GoodRx coupon card.  - If you need your  prescription sent electronically to a different pharmacy, notify our office through [facility] or by phone at [phone] option 4.     Si Usted Necesita Algo Despus de Su Visita  Tambin puede enviarnos un mensaje a travs de Clinical cytogeneticist. Por lo general respondemos a los mensajes de MyChart en el transcurso de 1 a 2 das hbiles.  Para renovar recetas, por favor pida a su farmacia que se ponga en contacto con nuestra oficina. Annie Sable de fax es [place] [phone].  Si tiene un asunto urgente cuando la clnica est cerrada y que no puede esperar hasta el siguiente da hbil, puede llamar/localizar a su doctor(a) al nmero que aparece a continuacin.   Por favor, tenga en cuenta que aunque hacemos todo lo posible para estar disponibles para asuntos urgentes fuera del horario de [place], no estamos disponibles las 24 horas del da, los 7 [address]  [address] de la [place].   Si tiene un problema urgente y no puede comunicarse con nosotros, puede optar por buscar atencin mdica  en el consultorio de su doctor(a), en una clnica privada, en un centro de atencin urgente o en una sala de emergencias.  Si tiene Engineer, drilling, por favor llame inmediatamente al 911 o vaya a la sala de emergencias.  Nmeros de bper  - Dr. Gwen Pounds: [phone]  - Dra. Roseanne Reno: [phone]  - Dr. Katrinka Blazing: [phone]   En caso de inclemencias del tiempo, por favor llame a Lacy Duverney principal al [phone] para una actualizacin sobre el [place] de cualquier retraso o cierre.  Consejos para la medicacin en dermatologa: Por favor, guarde las cajas en las que vienen los medicamentos de uso tpico para ayudarle a seguir las instrucciones sobre dnde y cmo usarlos. Las farmacias generalmente imprimen las instrucciones del medicamento slo en las cajas y no directamente en los tubos del [place].   Si su medicamento es muy caro, por favor, pngase en contacto con Rolm Gala llamando al  [phone] y presione la opcin 4 o envenos un mensaje a travs de Clinical cytogeneticist.   No podemos decirle cul ser su copago por los medicamentos por adelantado ya que esto es diferente dependiendo de la cobertura de su seguro. Sin embargo, es posible que podamos encontrar un medicamento sustituto a Audiological scientist un formulario para que el seguro cubra el medicamento que se considera necesario.   Si se requiere una autorizacin previa para que su compaa de seguros [place] su medicamento, por favor permtanos de 1 a 2 das hbiles para completar [address]  Los precios de los medicamentos varan con frecuencia dependiendo del Environmental consultant de dnde se surte la receta y alguna farmacias pueden ofrecer precios ms baratos.  El sitio web www.goodrx.com tiene cupones para medicamentos de Health and safety inspector. Los precios aqu no tienen en cuenta lo que podra costar con la ayuda del seguro (puede ser ms barato con su seguro), pero el sitio web puede darle el precio si no utiliz Tourist information centre manager.  -  Puede imprimir el cupn correspondiente y llevarlo con su receta a la farmacia.  - Tambin puede pasar por nuestra oficina durante el horario de atencin regular y Education officer, museum una tarjeta de cupones de GoodRx.  - Si necesita que su receta se enve electrnicamente a una farmacia diferente, informe a nuestra oficina a travs de MyChart de [place] o por telfono llamando al [phone] y presione la opcin 4.

## 2023-05-04 ENCOUNTER — Ambulatory Visit: Payer: Medicare Other

## 2023-05-09 ENCOUNTER — Ambulatory Visit: Payer: Medicare Other

## 2023-05-11 ENCOUNTER — Ambulatory Visit: Payer: Medicare Other

## 2023-05-16 ENCOUNTER — Ambulatory Visit: Payer: Medicare Other

## 2023-05-18 ENCOUNTER — Ambulatory Visit: Payer: Medicare Other

## 2023-05-23 ENCOUNTER — Ambulatory Visit: Payer: Medicare Other

## 2023-05-25 ENCOUNTER — Ambulatory Visit: Payer: Medicare Other

## 2023-05-30 ENCOUNTER — Ambulatory Visit: Payer: Medicare Other

## 2023-06-01 ENCOUNTER — Ambulatory Visit: Payer: Medicare Other

## 2023-06-06 ENCOUNTER — Ambulatory Visit: Payer: Medicare Other

## 2023-06-08 ENCOUNTER — Ambulatory Visit: Payer: Medicare Other

## 2023-06-13 ENCOUNTER — Ambulatory Visit: Payer: Medicare Other

## 2023-06-15 ENCOUNTER — Ambulatory Visit: Payer: Medicare Other

## 2023-06-20 ENCOUNTER — Ambulatory Visit: Payer: Medicare Other

## 2023-06-22 ENCOUNTER — Ambulatory Visit: Payer: Medicare Other

## 2023-06-27 ENCOUNTER — Ambulatory Visit: Payer: Medicare Other

## 2023-06-29 ENCOUNTER — Ambulatory Visit: Payer: Medicare Other

## 2023-06-29 ENCOUNTER — Encounter: Payer: Self-pay | Admitting: Podiatry

## 2023-06-29 ENCOUNTER — Ambulatory Visit: Admitting: Podiatry

## 2023-06-29 DIAGNOSIS — F3342 Major depressive disorder, recurrent, in full remission: Secondary | ICD-10-CM | POA: Insufficient documentation

## 2023-06-29 DIAGNOSIS — L600 Ingrowing nail: Secondary | ICD-10-CM

## 2023-06-29 MED ORDER — NEOMYCIN-POLYMYXIN-HC 1 % OT SOLN: OTIC | 1 refills | Status: AC

## 2023-06-29 NOTE — Patient Instructions (Signed)
 Betadine Soak Instructions  Purchase an 8 oz. bottle of BETADINE solution (Povidone)  THE DAY AFTER THE PROCEDURE  Place 1 tablespoon of betadine solution in a quart of warm tap water.  Submerge your foot or feet with outer bandage intact for the initial soak; this will allow the bandage to become moist and wet for easy lift off.  Once you remove your bandage, continue to soak in the solution for 20 minutes.  This soak should be done twice a day.  Next, remove your foot or feet from solution, blot dry the affected area and cover.  You may use a band aid large enough to cover the area or use gauze and tape.  Apply other medications to the area as directed by the doctor such as cortisporin otic solution (ear drops) or neosporin.  IF YOUR SKIN BECOMES IRRITATED WHILE USING THESE INSTRUCTIONS, IT IS OKAY TO SWITCH TO EPSOM SALTS AND WATER OR WHITE VINEGAR AND WATER.   Long Term Care Instructions-Post Nail Surgery  You have had your ingrown toenail and root treated with a chemical.  This chemical causes a burn that will drain and ooze like a blister.  This can drain for 6-8 weeks or longer.  It is important to keep this area clean, covered, and follow the soaking instructions dispensed at the time of your surgery.  This area will eventually dry and form a scab.  Once the scab forms you no longer need to soak or apply a dressing.  If at any time you experience an increase in pain, redness, swelling, or drainage, you should contact the office as soon as possible.

## 2023-06-29 NOTE — Progress Notes (Signed)
 He presents today with his wife he states that the toe that I complained about all the time is really just getting on my nerves and I would like to have this toenail removed.  He really hurts when anything hits the end of the toe.  If there is any pressure on the toenail at all is exquisitely painful.  Objective: Vital signs are stable alert and oriented x 3.  Pulses are palpable.  Hallux nail right exquisitely painful on palpation.  No erythema cellulitis drainage or odor.  Assessment: Painful thick mycotic nail hallux right.  Plan: At this point a total matrixectomy was performed with local anesthetic.  Tolerated procedure well.  He was given both oral and written home-going instructions of the care and soaking of the toe.  He was also provided a prescription with Corticosporin otic to be applied twice daily after soaking.  Follow-up with him in about 2 weeks

## 2023-07-04 ENCOUNTER — Ambulatory Visit: Payer: Medicare Other

## 2023-07-06 ENCOUNTER — Ambulatory Visit: Payer: Medicare Other

## 2023-07-11 ENCOUNTER — Ambulatory Visit: Payer: Medicare Other

## 2023-07-13 ENCOUNTER — Encounter: Payer: Self-pay | Admitting: Podiatry

## 2023-07-13 ENCOUNTER — Ambulatory Visit: Payer: Medicare Other

## 2023-07-13 ENCOUNTER — Ambulatory Visit: Admitting: Podiatry

## 2023-07-13 DIAGNOSIS — L600 Ingrowing nail: Secondary | ICD-10-CM

## 2023-07-13 NOTE — Progress Notes (Signed)
 [name] presents today and states that his hallux matrixectomy total right was doing well until his wife but came down on it.  Objective: Vitals are stable oriented x 3 there is mild erythema he does have a eschar present over the nailbed.  Does appear to be healing does not demonstrate any purulence.  Assessment: Well-healing surgical toe hallux right.  Plan: Will follow-up with him in 1 month asked him to continue to soak it daily Epsom salts and warm water covered in day but leave open to bedtime.  In 1 month if it has healed and we will consider doing the contralateral foot.

## 2023-07-18 ENCOUNTER — Ambulatory Visit: Payer: Medicare Other

## 2023-07-20 ENCOUNTER — Ambulatory Visit: Payer: Medicare Other

## 2023-07-27 ENCOUNTER — Ambulatory Visit: Payer: Medicare Other

## 2023-08-01 ENCOUNTER — Ambulatory Visit: Payer: Medicare Other

## 2023-08-03 ENCOUNTER — Ambulatory Visit: Payer: Medicare Other

## 2023-08-08 ENCOUNTER — Ambulatory Visit: Payer: Medicare Other

## 2023-08-10 ENCOUNTER — Ambulatory Visit: Payer: Medicare Other

## 2023-08-10 ENCOUNTER — Ambulatory Visit: Admitting: Podiatry

## 2023-08-10 ENCOUNTER — Encounter: Payer: Self-pay | Admitting: Podiatry

## 2023-08-10 DIAGNOSIS — L600 Ingrowing nail: Secondary | ICD-10-CM

## 2023-08-10 NOTE — Progress Notes (Signed)
 He presents with his wife today for follow-up of his nail removal hallux right.  States that it is looking a lot better.  Objective: Vital signs stable alert oriented x 3 hallux nail plate was avulsed right foot which appears to be healing very nicely is completely filled and is started reepithelialization.  Assessment: Well-healing toe procedure hallux left.  Plan: I explained to them that they should continue to soak Epsom salts and warm water until completely resolved with no drainage on the Band-Aid.  Covered in the day but leave open at night any increase in redness or pain notify us  immediately.

## 2023-08-15 ENCOUNTER — Ambulatory Visit: Payer: Medicare Other

## 2023-08-17 ENCOUNTER — Ambulatory Visit: Payer: Medicare Other

## 2023-08-22 ENCOUNTER — Ambulatory Visit: Payer: Medicare Other

## 2023-08-24 ENCOUNTER — Ambulatory Visit: Payer: Medicare Other

## 2023-08-29 ENCOUNTER — Ambulatory Visit: Payer: Medicare Other

## 2023-08-31 ENCOUNTER — Ambulatory Visit: Payer: Medicare Other

## 2023-09-05 ENCOUNTER — Ambulatory Visit: Payer: Medicare Other

## 2023-09-07 ENCOUNTER — Ambulatory Visit: Payer: Medicare Other

## 2023-09-12 ENCOUNTER — Ambulatory Visit: Payer: Medicare Other

## 2023-09-13 ENCOUNTER — Encounter: Payer: Self-pay | Admitting: Urology

## 2023-09-14 ENCOUNTER — Ambulatory Visit: Payer: Medicare Other

## 2023-09-19 ENCOUNTER — Ambulatory Visit: Payer: Medicare Other

## 2023-09-21 ENCOUNTER — Ambulatory Visit: Payer: Medicare Other

## 2023-09-23 ENCOUNTER — Other Ambulatory Visit: Payer: Self-pay | Admitting: Dermatology

## 2023-10-18 ENCOUNTER — Other Ambulatory Visit: Admitting: Urology

## 2023-10-18 ENCOUNTER — Other Ambulatory Visit: Payer: Self-pay | Admitting: Urology

## 2023-10-19 ENCOUNTER — Telehealth: Payer: Self-pay | Admitting: *Deleted

## 2023-10-19 NOTE — Telephone Encounter (Signed)
 Patient had toenail removed and he feels like another nail is growing back. What should he do with that?

## 2023-10-21 NOTE — Telephone Encounter (Signed)
 Called patient asking patient return call also advised if having discomfort schedule appointment to have toe nail evaluated and treated.

## 2023-11-03 ENCOUNTER — Encounter: Payer: Self-pay | Admitting: Dermatology

## 2023-11-03 ENCOUNTER — Ambulatory Visit: Admitting: Dermatology

## 2023-11-03 DIAGNOSIS — L570 Actinic keratosis: Secondary | ICD-10-CM

## 2023-11-03 DIAGNOSIS — D485 Neoplasm of uncertain behavior of skin: Secondary | ICD-10-CM

## 2023-11-03 DIAGNOSIS — W908XXA Exposure to other nonionizing radiation, initial encounter: Secondary | ICD-10-CM

## 2023-11-03 DIAGNOSIS — L219 Seborrheic dermatitis, unspecified: Secondary | ICD-10-CM

## 2023-11-03 DIAGNOSIS — D489 Neoplasm of uncertain behavior, unspecified: Secondary | ICD-10-CM

## 2023-11-03 MED ORDER — HYDROCORTISONE 2.5 % EX CREA: TOPICAL_CREAM | CUTANEOUS | 11 refills | Status: AC

## 2023-11-03 MED ORDER — KETOCONAZOLE 2 % EX CREA: 1.0000 | TOPICAL_CREAM | Freq: Two times a day (BID) | CUTANEOUS | 3 refills | Status: AC

## 2023-11-03 MED ORDER — CLOBETASOL PROPIONATE 0.05 % EX SOLN: CUTANEOUS | 5 refills | Status: AC

## 2023-11-03 MED ORDER — ZORYVE 0.3 % EX FOAM: 2.0000 g | Freq: Two times a day (BID) | CUTANEOUS | 5 refills | Status: AC

## 2023-11-03 MED ORDER — KETOCONAZOLE 2 % EX SHAM: MEDICATED_SHAMPOO | CUTANEOUS | 3 refills | Status: AC

## 2023-11-03 MED ORDER — HYDROCORTISONE 2.5 % EX CREA: TOPICAL_CREAM | CUTANEOUS | 11 refills | Status: DC

## 2023-11-03 NOTE — Patient Instructions (Addendum)
 -Continue Clobetasol  0.05% solution apply to aa's scalp QD-BID PRN  -Continue Ketoconazole  Shampoo apply three times per week, massage into scalp and leave in for 10 minutes before rinsing out  -Continue Ketoconazole  cream apply 1 Application topically 2 (two) times daily  -Start Zoryve  0.3% foam apply 2 grams twice daily to affected areas of skin   Topical steroids (such as triamcinolone , fluocinolone , fluocinonide, mometasone, clobetasol , halobetasol, betamethasone, hydrocortisone ) can cause thinning and lightening of the skin if they are used for too long in the same area. Your physician has selected the right strength medicine for your problem and area affected on the body. Please use your medication only as directed by your physician to prevent side effects.    Your prescription was sent to [facility] in [place]. A representative from [facility] will contact you within 3 business hours to verify your address and insurance information to schedule a free delivery. If for any reason you do not receive a phone call from them, please reach out to them. Their phone number is [phone] and their hours are Monday-Friday 9:00 am-5:00 pm.      Due to recent changes in healthcare laws, you may see results of your pathology and/or laboratory studies on MyChart before the doctors have had a chance to review them. We understand that in some cases there may be results that are confusing or concerning to you. Please understand that not all results are received at the same time and often the doctors may need to interpret multiple results in order to provide you with the best plan of care or course of treatment. Therefore, we ask that you please give us  2 business days to thoroughly review all your results before contacting the office for clarification. Should we see a critical lab result, you will be contacted sooner.   If You Need Anything After Your Visit  If you have any questions or  concerns for your doctor, please call our main line at [phone] and press option 4 to reach your doctor's medical assistant. If no one answers, please leave a voicemail as directed and we will return your call as soon as possible. Messages left after 4 pm will be answered the following business day.   You may also send us  a message via MyChart. We typically respond to MyChart messages within 1-2 business days.  For prescription refills, please ask your pharmacy to contact our office. Our fax number is [phone].  If you have an urgent issue when the clinic is closed that cannot wait until the next business day, you can page your doctor at the number below.    Please note that while we do our best to be available for urgent issues outside of office hours, we are not available 24/7.   If you have an urgent issue and are unable to reach us , you may choose to seek medical care at your doctor's office, retail clinic, urgent care center, or emergency room.  If you have a medical emergency, please immediately call 911 or go to the emergency department.  Pager Numbers  - Dr. Hester: [phone]  - Dr. Jackquline: [phone]  - Dr. Claudene: [phone]   - Dr. Raymund: [phone]  In the event of inclement weather, please call our main line at [phone] for an update on the status of any delays or closures.  Dermatology Medication Tips: Please keep the boxes that topical medications come in in order to help keep track of the instructions about where and how  to use these. Pharmacies typically print the medication instructions only on the boxes and not directly on the medication tubes.   If your medication is too expensive, please contact our office at [phone] option 4 or send us  a message through MyChart.   We are unable to tell what your co-pay for medications will be in advance as this is different depending on your insurance coverage. However, we may be able to find a  substitute medication at lower cost or fill out paperwork to get insurance to cover a needed medication.   If a prior authorization is required to get your medication covered by your insurance company, please allow us  1-2 business days to complete this process.  Drug prices often vary depending on where the prescription is filled and some pharmacies may offer cheaper prices.  The website www.goodrx.com contains coupons for medications through different pharmacies. The prices here do not account for what the cost may be with help from insurance (it may be cheaper with your insurance), but the website can give you the price if you did not use any insurance.  - You can print the associated coupon and take it with your prescription to the pharmacy.  - You may also stop by our office during regular business hours and pick up a GoodRx coupon card.  - If you need your prescription sent electronically to a different pharmacy, notify our office through [facility] or by phone at [phone] option 4.     Si Usted Necesita Algo Despus de Su Visita  Tambin puede enviarnos un mensaje a travs de Clinical cytogeneticist. Por lo general respondemos a los mensajes de MyChart en el transcurso de 1 a 2 das hbiles.  Para renovar recetas, por favor pida a su farmacia que se ponga en contacto con nuestra oficina. Randi lakes de fax es [place] [phone].  Si tiene un asunto urgente cuando la clnica est cerrada y que no puede esperar hasta el siguiente da hbil, puede llamar/localizar a su doctor(a) al nmero que aparece a continuacin.   Por favor, tenga en cuenta que aunque hacemos todo lo posible para estar disponibles para asuntos urgentes fuera del horario de [place], no estamos disponibles las 24 horas del da, los 7 [address]  [address] de la [place].   Si tiene un problema urgente y no puede comunicarse con nosotros, puede optar por buscar atencin mdica  en el consultorio de su doctor(a), en una clnica privada, en un  centro de atencin urgente o en una sala de emergencias.  Si tiene Engineer, drilling, por favor llame inmediatamente al 911 o vaya a la sala de emergencias.  Nmeros de bper  - Dr. Hester: [phone]  - Dra. Jackquline: [phone]  - Dr. Claudene: [phone]  - Dra. Kitts: [phone]  En caso de inclemencias del [place], por favor llame a nuestra lnea principal al [phone] para una actualizacin sobre el [place] de cualquier retraso o cierre.  Consejos para la medicacin en dermatologa: Por favor, guarde las cajas en las que vienen los medicamentos de uso tpico para ayudarle a seguir las instrucciones sobre dnde y cmo usarlos. Las farmacias generalmente imprimen las instrucciones del medicamento slo en las cajas y no directamente en los tubos del [place].   Si su medicamento es muy caro, por favor, pngase en contacto con landry rieger llamando al [phone] y presione la opcin 4 o envenos un mensaje a travs de Clinical cytogeneticist.   No podemos decirle cul ser su copago por los medicamentos por adelantado ya  que esto es diferente dependiendo de la cobertura de su seguro. Sin embargo, es posible que podamos encontrar un medicamento sustituto a Audiological scientist un formulario para que el seguro cubra el medicamento que se considera necesario.   Si se requiere una autorizacin previa para que su compaa de seguros [place] su medicamento, por favor permtanos de 1 a 2 das hbiles para completar este proceso.  Los precios de los medicamentos varan con frecuencia dependiendo del Environmental consultant de dnde se surte la receta y alguna farmacias pueden ofrecer precios ms baratos.  El sitio web www.goodrx.com tiene cupones para medicamentos de Health and safety inspector. Los precios aqu no tienen en cuenta lo que podra costar con la ayuda del seguro (puede ser ms barato con su seguro), pero el sitio web puede darle el precio si no utiliz Tourist information centre manager.  - Puede imprimir el cupn  correspondiente y llevarlo con su receta a la farmacia.  - Tambin puede pasar por nuestra oficina durante el horario de atencin regular y Education officer, museum una tarjeta de cupones de GoodRx.  - Si necesita que su receta se enve electrnicamente a una farmacia diferente, informe a nuestra oficina a travs de MyChart de [place] o por telfono llamando al [phone] y presione la opcin 4.

## 2023-11-03 NOTE — Progress Notes (Signed)
 Follow-Up Visit   Subjective  [name] is a 78 y.o. male who presents for the following: AK follow up. Patient has areas on concern on his face and scalp.   The following portions of the chart were reviewed this encounter and updated as appropriate: medications, allergies, medical history  Review of Systems:  No other skin or systemic complaints except as noted in HPI or Assessment and Plan.  Objective  Well appearing patient in no apparent distress; mood and affect are within normal limits.   A focused examination was performed of the following areas: Face and scalp  Relevant exam findings are noted in the Assessment and Plan.  R midhelix x1, R lower helix x1, Vertex scalp 18 (20) Pink scaly macules L mid Blatz 8mm pink scaly papule    Assessment & Plan   SEBORRHEIC DERMATITIS Exam: Pink patches with greasy scale at the face (perinasal, nasolabial folds, eyebrows), ears, postauricular sulci, neck, and scalp  Chronic and persistent condition with duration or expected duration over one year. Condition is symptomatic/ bothersome to patient. Not currently at goal.    Seborrheic Dermatitis is a chronic persistent rash characterized by pinkness and scaling most commonly of the mid face but also can occur on the scalp (dandruff), ears; mid chest, mid back and groin.  It tends to be exacerbated by stress and cooler weather.  People who have neurologic disease may experience new onset or exacerbation of existing seborrheic dermatitis.  The condition is not curable but treatable and can be controlled.  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 Your physician has selected the right strength medicine for your problem and area affected on the body. Please use your medication only as directed by your physician to prevent side effects.     SEBORRHEIC DERMATITIS   Related Medications ketoconazole  (NIZORAL ) 2 % shampoo apply three times per week, massage into scalp and leave in for 10 minutes before rinsing out. Use as directed in printout ketoconazole  (NIZORAL ) 2 % cream Apply 1 Application topically 2 (two) times daily. Use as directed in print out clobetasol  (TEMOVATE ) 0.05 % external solution Apply to aa's scalp QD-BID PRN. Roflumilast  (ZORYVE ) 0.3 % FOAM Apply 2 g topically in the morning and at bedtime. Apply 2 grams twice daily to affected areas of skin hydrocortisone  2.5 % cream Apply to rash on face ears and neck twice per day as needed until resolution ACTINIC KERATOSIS (20) R midhelix x1, R lower helix x1, Vertex scalp 18 (20) Actinic keratoses are precancerous spots that appear secondary to cumulative UV radiation exposure/sun exposure over time. They are chronic with expected duration over 1 year. A portion of actinic keratoses will progress to squamous cell carcinoma of the skin. It is not possible to reliably predict which spots will progress to skin cancer and so treatment is recommended to prevent development  of skin cancer.  Recommend daily broad spectrum sunscreen SPF 30+ to sun-exposed areas, reapply every 2 hours as needed.  Recommend staying in the shade or wearing long sleeves, sun glasses (UVA+UVB protection) and wide brim hats (4-inch brim  around the entire circumference of the hat). Call for new or changing lesions. Destruction of lesion - R midhelix x1, R lower helix x1, Vertex scalp 18 (20) Complexity: simple   Destruction method: cryotherapy   Informed consent: discussed and consent obtained   Timeout:  patient name, date of birth, surgical site, and procedure verified Lesion destroyed using liquid nitrogen: Yes   Region frozen until ice ball extended beyond lesion: Yes   Cryo cycles: 1 or 2. Outcome: patient tolerated procedure well with no complications   Post-procedure details: wound care instructions given   Additional details:  Prior to procedure, discussed risks of blister formation, small wound, skin dyspigmentation, or rare scar following cryotherapy. Recommend Vaseline ointment to treated areas while healing.   NEOPLASM OF UNCERTAIN BEHAVIOR L mid [place] Skin / nail biopsy Type of biopsy: tangential   Informed consent: discussed and consent obtained   Timeout: patient name, date of birth, surgical site, and procedure verified   Patient was prepped and draped in usual sterile fashion: Area prepped with alcohol. Anesthesia: the lesion was anesthetized in a standard fashion   Anesthetic:  1% lidocaine  w/ epinephrine 1-100,000 buffered w/ 8.4% NaHCO3 Instrument used: flexible razor blade   Hemostasis achieved with: pressure, aluminum chloride and electrodesiccation   Outcome: patient tolerated procedure well   Post-procedure details: wound care instructions given   Post-procedure details comment:  Ointment and small bandage applied  Specimen 1 - Surgical pathology Differential Diagnosis: AK vs SCC  Check Margins: No  Return in about 3 months (around 02/02/2024) for Seb Derm, AK's.  I, Emerick Ege, CMA am acting as scribe for Boneta Sharps, MD.   Documentation: I have reviewed the above documentation for accuracy and completeness, and I agree with the above.  Boneta Sharps, MD

## 2023-11-07 ENCOUNTER — Telehealth: Payer: Self-pay

## 2023-11-07 NOTE — Telephone Encounter (Signed)
 Patient was prescribed Zoryve  foam for seb derm at face, ears and scalp. Patient called to let us  know it is not covered and too expensive.  Lonell RAMAN., RMA

## 2023-11-08 ENCOUNTER — Ambulatory Visit: Payer: Self-pay | Admitting: Dermatology

## 2023-11-08 LAB — SURGICAL PATHOLOGY

## 2023-11-08 NOTE — Telephone Encounter (Signed)
 Due to his age and medicare insurance - he does not qualify for any copay assisting or copay cards so I am sure his copay is very expensive for this medication. aw

## 2023-11-08 NOTE — Telephone Encounter (Signed)
 Also the PA was denied. Preferred formulary drugs Mometasone Oint , Triamcinolone   Oint AND Ciclopirox topical gel.   I can do an appeal since he is treating sensitive areas on face but more than likely if approved - it will still be expensive due to [employer].

## 2023-11-09 NOTE — Telephone Encounter (Signed)
Left message for patient to return call regarding results  

## 2023-11-09 NOTE — Telephone Encounter (Signed)
-----   Message from [place] sent at 11/08/2023  5:56 PM EDT ----- Diagnosis: L mid [place] :       ACTINIC KERATOSIS, BASE INVOLVED   Plan: please call. Biopsy from left [place] shows precancers actinic keratosis. It will likely resolve after the biopsy. No treatment needed for now ----- Message ----- From: Interface, [facility] Sent: 11/08/2023   3:49 PM EDT To: Boneta Sharps, MD

## 2023-11-14 NOTE — Telephone Encounter (Signed)
 Left voicemail to return call for BX results. aw

## 2023-11-16 NOTE — Telephone Encounter (Signed)
 Patient advised of BX results. aw

## 2023-11-17 NOTE — Telephone Encounter (Signed)
 Appeal faxed to [facility]. aw

## 2023-11-21 NOTE — Telephone Encounter (Signed)
 [employer] has denial my appeal request.

## 2023-12-06 ENCOUNTER — Ambulatory Visit: Attending: Neurology

## 2023-12-06 DIAGNOSIS — G8929 Other chronic pain: Secondary | ICD-10-CM | POA: Diagnosis present

## 2023-12-06 DIAGNOSIS — R2681 Unsteadiness on feet: Secondary | ICD-10-CM | POA: Diagnosis present

## 2023-12-06 DIAGNOSIS — R2689 Other abnormalities of gait and mobility: Secondary | ICD-10-CM | POA: Diagnosis present

## 2023-12-06 DIAGNOSIS — M6281 Muscle weakness (generalized): Secondary | ICD-10-CM | POA: Insufficient documentation

## 2023-12-06 DIAGNOSIS — R269 Unspecified abnormalities of gait and mobility: Secondary | ICD-10-CM | POA: Insufficient documentation

## 2023-12-06 DIAGNOSIS — M545 Low back pain, unspecified: Secondary | ICD-10-CM | POA: Diagnosis present

## 2023-12-06 DIAGNOSIS — R262 Difficulty in walking, not elsewhere classified: Secondary | ICD-10-CM | POA: Diagnosis present

## 2023-12-06 DIAGNOSIS — R278 Other lack of coordination: Secondary | ICD-10-CM | POA: Insufficient documentation

## 2023-12-06 NOTE — Therapy (Signed)
 OUTPATIENT PHYSICAL THERAPY NEURO EVALUATION   Patient Name: [name] MRN: [id number] DOB:07-08-1945, 78 y.o., male Today's Date: 12/07/2023   PCP: Damien Ryder, PA-C REFERRING PROVIDER: Dr. Arthea Farrow  END OF SESSION:  PT End of Session - 12/06/23 1459     Visit Number 1    Number of Visits 24    Date for Recertification  02/28/24    Progress Note Due on Visit 10    PT Start Time 1448    PT Stop Time 1529    PT Time Calculation (min) 41 min    Equipment Utilized During Treatment Gait belt    Activity Tolerance Patient tolerated treatment well    Behavior During Therapy [facility] for tasks assessed/performed          Past Medical History:  Diagnosis Date   Anxiety    Arteriosclerosis of coronary artery 11/16/2011   Overview:  Stent 10/2011 stent rca 2015 with collaterals to lad which is chronically occluded    Benign enlargement of prostate    Benign essential HTN 06/11/2014   Benign prostatic hypertrophy without urinary obstruction 07/31/2014   Bilateral cataracts 05/16/2013   Overview:  [facility]     Bone spur of foot    Left   BP (high blood pressure) 11/09/2012   Cancer (HCC)    skin (forehead) and bladder   Carotid artery narrowing 02/08/2014   Depression    Detrusor hypertrophy    Diabetes (HCC)    Diabetes mellitus, type 2 (HCC) 12/12/2012   Diverticulosis    Dyspnea    Esophageal reflux    Esophageal reflux    Fothergill's neuralgia 08/07/2012   Overview:  [facility]    Gastritis    GERD (gastroesophageal reflux disease)    Headache    cluster headaches   Healed myocardial infarct 11/09/2012   Hearing loss in left ear    Heart disease    Hematuria    Hemorrhoids    History of hiatal hernia 12/14/2017   small    Hypercholesteremia    Lesion of bladder    Myocardial infarct (HCC)    Presence of stent in coronary artery 11/09/2012   Rectal bleeding    Trigeminal neuralgia    Trigeminal neuralgia    Valvular heart disease     Vitamin D deficiency    Past Surgical History:  Procedure Laterality Date   APPENDECTOMY     BOTOX  INJECTION N/A 12/21/2017   Procedure: Bladder BOTOX  INJECTION;  Surgeon: Penne Knee, MD;  Location: [facility] ORS;  Service: Urology;  Laterality: N/A;   BOTOX  INJECTION N/A 09/11/2018   Procedure: Bladder BOTOX  INJECTION;  Surgeon: Penne Knee, MD;  Location: [facility] ORS;  Service: Urology;  Laterality: N/A;   CARDIAC CATHETERIZATION     CARDIAC CATHETERIZATION N/A 01/22/2015   Procedure: Left Heart Cath;  Surgeon: Wolm JINNY Rhyme, MD;  Location: [facility] INVASIVE CV LAB;  Service: Cardiovascular;  Laterality: N/A;   CARDIAC CATHETERIZATION N/A 01/22/2015   Procedure: Coronary Stent Intervention;  Surgeon: Marsa Dooms, MD;  Location: [facility] INVASIVE CV LAB;  Service: Cardiovascular;  Laterality: N/A;   CATARACT EXTRACTION, BILATERAL     COLONOSCOPY WITH PROPOFOL  N/A 12/08/2015   Procedure: COLONOSCOPY WITH PROPOFOL ;  Surgeon: Gladis RAYMOND Mariner, MD;  Location: [facility] ENDOSCOPY;  Service: Endoscopy;  Laterality: N/A;   COLONOSCOPY WITH PROPOFOL  N/A 12/09/2015   Procedure: COLONOSCOPY WITH PROPOFOL ;  Surgeon: Gladis RAYMOND Mariner, MD;  Location: [facility] ENDOSCOPY;  Service: Endoscopy;  Laterality:  N/A;   CORONARY ANGIOPLASTY     5 stents   CORONARY STENT PLACEMENT  2015   x5   CYSTOSCOPY N/A 09/11/2018   Procedure: CYSTOSCOPY;  Surgeon: Penne Knee, MD;  Location: [facility] ORS;  Service: Urology;  Laterality: N/A;   CYSTOSCOPY WITH BIOPSY N/A 12/21/2017   Procedure: CYSTOSCOPY WITH BIOPSY;  Surgeon: Penne Knee, MD;  Location: [facility] ORS;  Service: Urology;  Laterality: N/A;   ESOPHAGOGASTRODUODENOSCOPY (EGD) WITH PROPOFOL  N/A 12/08/2015   Procedure: ESOPHAGOGASTRODUODENOSCOPY (EGD) WITH PROPOFOL ;  Surgeon: Gladis RAYMOND Mariner, MD;  Location: [facility] ENDOSCOPY;  Service: Endoscopy;  Laterality: N/A;   ESOPHAGOGASTRODUODENOSCOPY (EGD) WITH PROPOFOL  N/A 12/13/2017   Procedure: ESOPHAGOGASTRODUODENOSCOPY  (EGD) WITH PROPOFOL ;  Surgeon: Mariner Gladis RAYMOND, MD;  Location: [facility] ENDOSCOPY;  Service: Endoscopy;  Laterality: N/A;   EYE SURGERY     HERNIA REPAIR     kidney tumor remove     TRANSURETHRAL RESECTION OF BLADDER TUMOR WITH GYRUS (TURBT-GYRUS)  12/2013   UMBILICAL HERNIA REPAIR     urethral meatotomy     Patient Active Problem List   Diagnosis Date Noted   Major depressive disorder, recurrent, in full remission 06/29/2023   Osteoarthritis of right hip 04/06/2023   Spinal stenosis of lumbar region 12/22/2022   Pulmonary emphysema (HCC) 12/21/2022   Arthropathy of lumbar facet joint 11/19/2022   Pain in joint of right shoulder 11/19/2022   Sacroiliac joint pain 11/19/2022   Acute hypoxemic respiratory failure (HCC) 03/18/2022   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 03/18/2022   Fall 03/18/2022   Overactive bladder 03/18/2022   Bilateral leg edema 09/16/2021   Anxiety 04/01/2021   History of bladder cancer 04/01/2021   Parkinson disease (HCC) 04/01/2021   Inflammatory pain 02/18/2021   Neck pain 02/18/2021   Class 2 obesity due to excess calories with body mass index (BMI) of 36.0 to 36.9 in adult 04/11/2020   Swelling of limb 03/28/2020   Lymphedema 03/28/2020   Trigeminal neuralgia 12/25/2019   Lower limb ulcer, calf, left, limited to breakdown of skin (HCC) 12/25/2019   OSA (obstructive sleep apnea) 01/23/2019   Acute systolic CHF (congestive heart failure) (HCC) 12/12/2018   Bruising 07/10/2018   Diet-controlled type 2 diabetes mellitus (HCC) 03/24/2018   Microalbuminuria 03/24/2018   Dizziness 11/17/2017   Simple chronic bronchitis (HCC) 09/22/2017   SOBOE (shortness of breath on exertion) 02/18/2017   Chronic midline low back pain without sciatica 12/29/2016   Periodic limb movement disorder 12/29/2016   Benign essential tremor 06/22/2016   Pure hypercholesterolemia 06/20/2015   Erectile dysfunction due to arterial insufficiency 06/16/2015    Unstable angina (HCC) 01/22/2015   Abdominal aortic 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 rupture 12/31/2014   Benign prostatic hyperplasia without urinary obstruction 07/31/2014   Enlarged prostate 07/31/2014   Benign essential HTN 06/11/2014   Carotid artery narrowing 02/08/2014   Carotid artery obstruction 02/08/2014   Bilateral carotid artery stenosis 02/08/2014   Chest pain 08/20/2013   Bilateral cataracts 05/16/2013   Cataract 05/16/2013   Clinical depression 12/12/2012   Diabetes mellitus, type 2 (HCC) 12/12/2012   Combined fat and carbohydrate induced hyperlipemia 12/12/2012   Major depressive disorder, single episode, unspecified 12/12/2012   Acid reflux 11/09/2012   Presence of stent in coronary artery 11/09/2012   BP (high blood pressure) 11/09/2012   Healed myocardial infarct 11/09/2012   Gastro-esophageal reflux disease without esophagitis 11/09/2012   History of cardiovascular surgery 11/09/2012   H/O heart artery stent 11/09/2012  Fothergill's neuralgia 08/07/2012   Swelling of testicle 04/06/2012   Disorder of male genital organ 04/06/2012   Swelling of the testicles 04/06/2012   Arteriosclerosis of coronary artery 11/16/2011   CAD in native artery 11/16/2011   Fatigue 06/04/2011   Avitaminosis D 11/30/2010    ONSET DATE: 07/2023  REFERRING DIAG: R26.89 (ICD-10-CM) - Shuffling gait   THERAPY DIAG:  Muscle weakness (generalized) - Plan: PT plan of care cert/re-cert  Difficulty in walking, not elsewhere classified - Plan: PT plan of care cert/re-cert  Unsteadiness on feet - Plan: PT plan of care cert/re-cert  Chronic bilateral low back pain without sciatica - Plan: PT plan of care cert/re-cert  Other abnormalities of gait and mobility - Plan: PT plan of care cert/re-cert  Abnormality of gait and mobility - Plan: PT plan of care cert/re-cert  Other lack of coordination - Plan: PT plan of care cert/re-cert  Rationale for Evaluation and Treatment:  Rehabilitation  SUBJECTIVE:                                                                                                                                                                                             SUBJECTIVE STATEMENT: I was trying to keep going with my exercises- going to well [facility] but just couldn't go consistent and one thing after another. I feel weak again and difficulty getting around. Wife also endorses his increased difficulty moving around in home and limited community outings.   Pt accompanied by: significant other  PERTINENT HISTORY:  Patient is 78 year old male well known to clinic with PMH: Parkinsons, HTN, cervical spondylosis, CAD, Spinal stenosis, DM type 2, OSA. Patient has completed previous episodes of care with successful results including modified independence with use of walker in community and presents back today after discharge back in Feb 2024- going to [facility] some when able.   Per Neurology latest visit on 11/02/2023 Patient reports Patient with worsening gait, balance and lower extremity weakness. Increased difficulty with transitions, getting out of bed and chair. Vivid dreams and acting out dreams at night. Visual hallucinations occur sporadically, not distressing. Worsening tremors in bilateral hands with movement. Difficulty with fine motor movements and grip strength. - Memory evaluation from 08/22/2023 was 30/30. Repeat at next visit.  - Referral to PT for gait and balance.   PAIN:  Are you having pain? Yes: NPRS scale: 3-4/10 B hand pain Pain location: B hands Pain description: achy Aggravating factors: Bumping hand into hand, working with my hands Relieving factors: Rest  PRECAUTIONS: Fall  RED FLAGS: None   WEIGHT BEARING RESTRICTIONS: No  FALLS: Has patient  fallen in last 6 months? No  LIVING ENVIRONMENT: Lives with: lives with their spouse Lives in: House/apartment Stairs: Yes: External: 5 steps; 2 steps in garage with handle  on Right side. Has following equipment at home: Vannie - 2 wheeled, Environmental consultant - 4 wheeled, and Upright walker   PLOF: Independent with gait, Needs assistance with ADLs, and Needs assistance with homemaking  PATIENT GOALS: Get my strength back in my legs.  OBJECTIVE:  Note: Objective measures were completed at Evaluation unless otherwise noted.  DIAGNOSTIC FINDINGS:  Narrative & Impression  CLINICAL DATA:  Left-sided chest pain and shortness of breath for the past week.   EXAM: CT ANGIOGRAPHY CHEST WITH CONTRAST   IMPRESSION: 1. No evidence of pulmonary embolism or other acute intrathoracic process. 2. Aortic Atherosclerosis (ICD10-I70.0) and Emphysema (ICD10-J43.9).     Electronically Signed   By: Elsie ONEIDA Shoulder M.D.   On: 02/14/2023 15:31    COGNITION: Overall cognitive status: Within functional limits for tasks assessed   SENSATION: WFL  COORDINATION: Impaired heel to shin and with movement patterns- slower to repsond  EDEMA:  Not directly measured but wearing compression socks and know history of BLE swelling.    POSTURE: rounded shoulders, forward head, increased lumbar lordosis, and Stiff neck- rotated to left   LOWER EXTREMITY ROM:     Active  Right Eval Left Eval  Hip flexion    Hip extension    Hip abduction    Hip adduction    Hip internal rotation    Hip external rotation    Knee flexion    Knee extension    Ankle dorsiflexion    Ankle plantarflexion    Ankle inversion    Ankle eversion     (Blank rows = not tested)  LOWER EXTREMITY MMT:    MMT Right Eval Left Eval  Hip flexion 4 4  Hip extension    Hip abduction 3+ 4  Hip adduction    Hip internal rotation    Hip external rotation    Knee flexion 4 4  Knee extension 4 4  Ankle dorsiflexion 4 4  Ankle plantarflexion    Ankle inversion    Ankle eversion    (Blank rows = not tested)  BED MOBILITY:  Not tested  TRANSFERS: Sit to stand: CGA  Assistive device utilized: Environmental consultant -  4 wheeled     Stand to sit: CGA  Assistive device utilized: Environmental consultant - 4 wheeled     Chair to chair: CGA  Assistive device utilized: Environmental consultant - 4 wheeled       RAMP:  Not tested  CURB:  Not tested  STAIRS: Not tested GAIT: Findings: Gait Characteristics: step to pattern, decreased step length- Right, decreased step length- Left, decreased stance time- Right, decreased stance time- Left, decreased stride length, decreased ankle dorsiflexion- Right, decreased ankle dorsiflexion- Left, knee flexed in stance- Right, knee flexed in stance- Left, shuffling, decreased trunk rotation, trunk flexed, and wide BOS, Distance walked: 100 feet, Assistive device utilized:Walker - 4 wheeled, Level of assistance: CGA, and Comments:    FUNCTIONAL TESTS:  5 times sit to stand: 11.21 without UE Support  Timed up and go (TUG): 17.85 sec with Rollator 6 minute walk test: To be assessed next visit 10 meter walk test: To be assessed next visit Berg Balance Scale:  Item Test date: 12/06/2023 Date:  Date:   Sitting to standing 4. able to stand without using hands and stabilize independently Insert SmartPhrase OPRCBERGREEVAL Insert SmartPhrase OPRCBERGREEVAL  2. Standing unsupported 4. able to stand safely for 2 minutes    3. Sitting with back unsupported, feet supported 4. able to sit safely and securely for 2 minutes    4. Standing to sitting 4. sits safely with minimal use of hands    5. Pivot transfer  4. able to transfer safely with minor use of hands    6. Standing unsupported with eyes closed 3. able to stand 10 seconds with supervision    7. Standing unsupported with feet together 3. able to place feet together independently and stand 1 minute with supervision    8. Reaching forward with outstretched arms while standing 2. can reach forward 5 cm (2 inches)    9. Pick up object from the floor from standing 0. unable to try/needs assistance to keep from losing balance or falling    10. Turning to look behind  over left and right shoulders while standing 2. turns sideways but only maintains balance    11. Turn 360 degrees 1. needs close supervision or verbal cuing    12. Place alternate foot on step or stool while standing unsupported 2. able to complete 4 steps without aid with supervision    13. Standing unsupported one foot in front 1. needs help to step but can hold 15 seconds    14. Standing on one leg 0. unable to try of needs assist to prevent fall      Total Score 34/56 Total Score:    Total Score:      PATIENT SURVEYS:  ABC scale: to be completed next visit                                                                                                                             TREATMENT DATE: 12/06/2023  PT EVALUATION  Review of previous HEP including ROM for Parkinsons- trunk and LE flexibility-  Discussion of importance of using upright walker vs. 4WW to assist with posture.  Discussion of home life and need to remain as active as possible- providing details/examples of some safe home activities.     PATIENT EDUCATION: Education details: Proposed plan of care Person educated: Patient Education method: Explanation Education comprehension: verbalized understanding  HOME EXERCISE PROGRAM: Reviewed previous HEP from most recent EOC- will modify next 1-2 visits as appropriate.   GOALS: Goals reviewed with patient? Yes  SHORT TERM GOALS: Target date: 01/17/2024  Pt will be independent with a comprehensive HEP in order to improve strength and balance in order to decrease fall risk and improve function at home and work  Baseline: EVAL- Reviewed previously instructed HEP today and patient admits to not performing much of ROM/LE strengthening and has not been consistently able to go to well [facility] due to difficulty getting out some days. Goal status: INITIAL  LONG TERM GOALS: Target date: 02/28/2024  1.  Patient will complete > 13 reps in 30 sec Sit to stand test indicating an  increased LE strength and improved balance. Baseline: EVAL- To be completed visit #2 Goal status: INITIAL  2.  Patient will improve ABC score by 9 points   to demonstrate statistically significant improvement in mobility and quality of life as it relates to their confidence in their balance.  Baseline: EVAL- To be assessed visit #2 Goal status: INITIAL   3.  Patient will increase Berg Balance score by > 6 points to demonstrate decreased fall risk during functional activities. Baseline: EVAL= 34/56 and when previously discharged back on 04/25/2023= 40/56 Goal status: INITIAL   4.   Patient will reduce timed up and go to <11 seconds to reduce fall risk and demonstrate improved transfer/gait ability. Baseline: EVAL= 17.85 sec w rollator and previously on 04/25/2023 Goal status: INITIAL  4.   Patient will increase 10 meter walk test by 0.2 m/s as to improve gait speed for better community ambulation and to reduce fall risk. Baseline: Eval= to be assessed visit #2 Goal status: INITIAL  5.   Patient will increase six minute walk test distance to >1000 for progression to community ambulator and improve gait ability Baseline: Eval- to be assessed visit #2 and previously 1100 feet upon discharge in 04/2023 Goal status: INITIAL ASSESSMENT:  CLINICAL IMPRESSION: Patient is a 78 y.o. male who was seen today for physical therapy evaluation and treatment for shuffling of gait.   OBJECTIVE IMPAIRMENTS: Abnormal gait, cardiopulmonary status limiting activity, decreased activity tolerance, decreased balance, decreased coordination, decreased endurance, decreased mobility, difficulty walking, decreased ROM, decreased strength, hypomobility, postural dysfunction, obesity, and pain.   ACTIVITY LIMITATIONS: carrying, lifting, bending, standing, squatting, stairs, transfers, bed mobility, dressing, and reach over head  PARTICIPATION LIMITATIONS: meal prep, cleaning, laundry, medication management, driving,  shopping, community activity, occupation, and yard work  PERSONAL FACTORS: Age, Time since onset of injury/illness/exacerbation, and 3+ comorbidities: OA, Low back pain with sciatica, HTN, Trigeminal Neuralgia are also affecting patient's functional outcome.   REHAB POTENTIAL: Good  CLINICAL DECISION MAKING: Evolving/moderate complexity  EVALUATION COMPLEXITY: Moderate  PLAN:  PT FREQUENCY: 1-2x/week  PT DURATION: 12 weeks  PLANNED INTERVENTIONS: [id number]- PT Re-evaluation, 97750- Physical Performance Testing, 97110-Therapeutic exercises, 97530- Therapeutic activity, [id number]- Neuromuscular re-education, 97535- Self Care, [id number]- Manual therapy, [id number]- Gait training, [id number]- Orthotic Initial, [id number]- Orthotic/Prosthetic subsequent, [phone]- Canalith repositioning, [id number]- Electrical stimulation (manual), [id number]- Ultrasound, [id number] (1-2 muscles), [id number] (3+ muscles)- Dry Needling, Patient/Family education, Balance training, Stair training, Taping, Joint mobilization, Joint manipulation, Spinal manipulation, Spinal mobilization, Vestibular training, DME instructions, Cryotherapy, and Moist heat  PLAN FOR NEXT SESSION:   Complete ABC scale- modify goal as needed. Complete 30 sec STS and modify goal as appropriate Complete 10 MWT Complete 6 min walk test- modify goal  Reyes LOISE London, PT 12/07/2023, 11:16 AM

## 2023-12-08 MED ORDER — FENTANYL CITRATE (PF) 100 MCG/2ML IJ SOLN
INTRAMUSCULAR | Status: AC
  Filled 2023-12-08: qty 2

## 2023-12-08 MED ORDER — PROPOFOL 1000 MG/100ML IV EMUL
INTRAVENOUS | Status: AC
  Filled 2023-12-08: qty 100

## 2023-12-08 MED ORDER — MIDAZOLAM HCL 2 MG/2ML IJ SOLN
INTRAMUSCULAR | Status: AC
  Filled 2023-12-08: qty 2

## 2023-12-08 MED ORDER — PROPOFOL 10 MG/ML IV BOLUS
INTRAVENOUS | Status: AC
  Filled 2023-12-08: qty 20

## 2023-12-12 NOTE — Therapy (Signed)
 OUTPATIENT PHYSICAL THERAPY NEURO TREATMENT   Patient Name: [name] MRN: [id number] DOB:07-Mar-1945, 78 y.o., male Today's Date: 12/14/2023   PCP: Damien Ryder, PA-C REFERRING PROVIDER: Dr. Arthea Farrow  END OF SESSION:  PT End of Session - 12/13/23 1107     Visit Number 2    Number of Visits 24    Date for Recertification  02/28/24    Progress Note Due on Visit 10    PT Start Time 1103    PT Stop Time 1143    PT Time Calculation (min) 40 min    Equipment Utilized During Treatment Gait belt    Activity Tolerance Patient tolerated treatment well    Behavior During Therapy [facility] for tasks assessed/performed           Past Medical History:  Diagnosis Date   Anxiety    Arteriosclerosis of coronary artery 11/16/2011   Overview:  Stent 10/2011 stent rca 2015 with collaterals to lad which is chronically occluded    Benign enlargement of prostate    Benign essential HTN 06/11/2014   Benign prostatic hypertrophy without urinary obstruction 07/31/2014   Bilateral cataracts 05/16/2013   Overview:  [facility]     Bone spur of foot    Left   BP (high blood pressure) 11/09/2012   Cancer (HCC)    skin (forehead) and bladder   Carotid artery narrowing 02/08/2014   Depression    Detrusor hypertrophy    Diabetes (HCC)    Diabetes mellitus, type 2 (HCC) 12/12/2012   Diverticulosis    Dyspnea    Esophageal reflux    Esophageal reflux    Fothergill's neuralgia 08/07/2012   Overview:  [facility]    Gastritis    GERD (gastroesophageal reflux disease)    Headache    cluster headaches   Healed myocardial infarct 11/09/2012   Hearing loss in left ear    Heart disease    Hematuria    Hemorrhoids    History of hiatal hernia 12/14/2017   small    Hypercholesteremia    Lesion of bladder    Myocardial infarct (HCC)    Presence of stent in coronary artery 11/09/2012   Rectal bleeding    Trigeminal neuralgia    Trigeminal neuralgia    Valvular heart disease     Vitamin D deficiency    Past Surgical History:  Procedure Laterality Date   APPENDECTOMY     BOTOX  INJECTION N/A 12/21/2017   Procedure: Bladder BOTOX  INJECTION;  Surgeon: Penne Knee, MD;  Location: [facility] ORS;  Service: Urology;  Laterality: N/A;   BOTOX  INJECTION N/A 09/11/2018   Procedure: Bladder BOTOX  INJECTION;  Surgeon: Penne Knee, MD;  Location: [facility] ORS;  Service: Urology;  Laterality: N/A;   CARDIAC CATHETERIZATION     CARDIAC CATHETERIZATION N/A 01/22/2015   Procedure: Left Heart Cath;  Surgeon: Wolm JINNY Rhyme, MD;  Location: [facility] INVASIVE CV LAB;  Service: Cardiovascular;  Laterality: N/A;   CARDIAC CATHETERIZATION N/A 01/22/2015   Procedure: Coronary Stent Intervention;  Surgeon: Marsa Dooms, MD;  Location: [facility] INVASIVE CV LAB;  Service: Cardiovascular;  Laterality: N/A;   CATARACT EXTRACTION, BILATERAL     COLONOSCOPY WITH PROPOFOL  N/A 12/08/2015   Procedure: COLONOSCOPY WITH PROPOFOL ;  Surgeon: Gladis RAYMOND Mariner, MD;  Location: [facility] ENDOSCOPY;  Service: Endoscopy;  Laterality: N/A;   COLONOSCOPY WITH PROPOFOL  N/A 12/09/2015   Procedure: COLONOSCOPY WITH PROPOFOL ;  Surgeon: Gladis RAYMOND Mariner, MD;  Location: [facility] ENDOSCOPY;  Service: Endoscopy;  Laterality: N/A;   CORONARY ANGIOPLASTY     5 stents   CORONARY STENT PLACEMENT  2015   x5   CYSTOSCOPY N/A 09/11/2018   Procedure: CYSTOSCOPY;  Surgeon: Penne Knee, MD;  Location: [facility] ORS;  Service: Urology;  Laterality: N/A;   CYSTOSCOPY WITH BIOPSY N/A 12/21/2017   Procedure: CYSTOSCOPY WITH BIOPSY;  Surgeon: Penne Knee, MD;  Location: [facility] ORS;  Service: Urology;  Laterality: N/A;   ESOPHAGOGASTRODUODENOSCOPY (EGD) WITH PROPOFOL  N/A 12/08/2015   Procedure: ESOPHAGOGASTRODUODENOSCOPY (EGD) WITH PROPOFOL ;  Surgeon: Gladis RAYMOND Mariner, MD;  Location: [facility] ENDOSCOPY;  Service: Endoscopy;  Laterality: N/A;   ESOPHAGOGASTRODUODENOSCOPY (EGD) WITH PROPOFOL  N/A 12/13/2017   Procedure: ESOPHAGOGASTRODUODENOSCOPY  (EGD) WITH PROPOFOL ;  Surgeon: Mariner Gladis RAYMOND, MD;  Location: [facility] ENDOSCOPY;  Service: Endoscopy;  Laterality: N/A;   EYE SURGERY     HERNIA REPAIR     kidney tumor remove     TRANSURETHRAL RESECTION OF BLADDER TUMOR WITH GYRUS (TURBT-GYRUS)  12/2013   UMBILICAL HERNIA REPAIR     urethral meatotomy     Patient Active Problem List   Diagnosis Date Noted   Major depressive disorder, recurrent, in full remission 06/29/2023   Osteoarthritis of right hip 04/06/2023   Spinal stenosis of lumbar region 12/22/2022   Pulmonary emphysema (HCC) 12/21/2022   Arthropathy of lumbar facet joint 11/19/2022   Pain in joint of right shoulder 11/19/2022   Sacroiliac joint pain 11/19/2022   Acute hypoxemic respiratory failure (HCC) 03/18/2022   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 03/18/2022   Fall 03/18/2022   Overactive bladder 03/18/2022   Bilateral leg edema 09/16/2021   Anxiety 04/01/2021   History of bladder cancer 04/01/2021   Parkinson disease (HCC) 04/01/2021   Inflammatory pain 02/18/2021   Neck pain 02/18/2021   Class 2 obesity due to excess calories with body mass index (BMI) of 36.0 to 36.9 in adult 04/11/2020   Swelling of limb 03/28/2020   Lymphedema 03/28/2020   Trigeminal neuralgia 12/25/2019   Lower limb ulcer, calf, left, limited to breakdown of skin (HCC) 12/25/2019   OSA (obstructive sleep apnea) 01/23/2019   Acute systolic CHF (congestive heart failure) (HCC) 12/12/2018   Bruising 07/10/2018   Diet-controlled type 2 diabetes mellitus (HCC) 03/24/2018   Microalbuminuria 03/24/2018   Dizziness 11/17/2017   Simple chronic bronchitis (HCC) 09/22/2017   SOBOE (shortness of breath on exertion) 02/18/2017   Chronic midline low back pain without sciatica 12/29/2016   Periodic limb movement disorder 12/29/2016   Benign essential tremor 06/22/2016   Pure hypercholesterolemia 06/20/2015   Erectile dysfunction due to arterial insufficiency 06/16/2015    Unstable angina (HCC) 01/22/2015   Abdominal aortic 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 rupture 12/31/2014   Benign prostatic hyperplasia without urinary obstruction 07/31/2014   Enlarged prostate 07/31/2014   Benign essential HTN 06/11/2014   Carotid artery narrowing 02/08/2014   Carotid artery obstruction 02/08/2014   Bilateral carotid artery stenosis 02/08/2014   Chest pain 08/20/2013   Bilateral cataracts 05/16/2013   Cataract 05/16/2013   Clinical depression 12/12/2012   Diabetes mellitus, type 2 (HCC) 12/12/2012   Combined fat and carbohydrate induced hyperlipemia 12/12/2012   Major depressive disorder, single episode, unspecified 12/12/2012   Acid reflux 11/09/2012   Presence of stent in coronary artery 11/09/2012   BP (high blood pressure) 11/09/2012   Healed myocardial infarct 11/09/2012   Gastro-esophageal reflux disease without esophagitis 11/09/2012   History of cardiovascular surgery 11/09/2012   H/O heart artery stent 11/09/2012  Fothergill's neuralgia 08/07/2012   Swelling of testicle 04/06/2012   Disorder of male genital organ 04/06/2012   Swelling of the testicles 04/06/2012   Arteriosclerosis of coronary artery 11/16/2011   CAD in native artery 11/16/2011   Fatigue 06/04/2011   Avitaminosis D 11/30/2010    ONSET DATE: 07/2023  REFERRING DIAG: R26.89 (ICD-10-CM) - Shuffling gait   THERAPY DIAG:  Muscle weakness (generalized)  Unsteadiness on feet  Difficulty in walking, not elsewhere classified  Chronic bilateral low back pain without sciatica  Other abnormalities of gait and mobility  Abnormality of gait and mobility  Other lack of coordination  Rationale for Evaluation and Treatment: Rehabilitation  SUBJECTIVE:                                                                                                                                                                                             SUBJECTIVE STATEMENT: Today: Patient reports moving around  okay- still stiff and slow going.    From EVAL: I was trying to keep going with my exercises- going to well zone but just couldn't go consistent and one thing after another. I feel weak again and difficulty getting around. Wife also endorses his increased difficulty moving around in home and limited community outings.   Pt accompanied by: significant other  PERTINENT HISTORY:  Patient is 78 year old male well known to clinic with PMH: Parkinsons, HTN, cervical spondylosis, CAD, Spinal stenosis, DM type 2, OSA. Patient has completed previous episodes of care with successful results including modified independence with use of walker in community and presents back today after discharge back in Feb 2024- going to [facility] some when able.   Per Neurology latest visit on 11/02/2023 Patient reports Patient with worsening gait, balance and lower extremity weakness. Increased difficulty with transitions, getting out of bed and chair. Vivid dreams and acting out dreams at night. Visual hallucinations occur sporadically, not distressing. Worsening tremors in bilateral hands with movement. Difficulty with fine motor movements and grip strength. - Memory evaluation from 08/22/2023 was 30/30. Repeat at next visit.  - Referral to PT for gait and balance.   PAIN:  Are you having pain? Yes: NPRS scale: 3-4/10 B hand pain Pain location: B hands Pain description: achy Aggravating factors: Bumping hand into hand, working with my hands Relieving factors: Rest  PRECAUTIONS: Fall  RED FLAGS: None   WEIGHT BEARING RESTRICTIONS: No  FALLS: Has patient fallen in last 6 months? No  LIVING ENVIRONMENT: Lives with: lives with their spouse Lives in: House/apartment Stairs: Yes: External: 5 steps; 2 steps in garage with handle on Right side. Has  following equipment at home: Vannie - 2 wheeled, Environmental consultant - 4 wheeled, and Upright walker   PLOF: Independent with gait, Needs assistance with ADLs, and Needs assistance  with homemaking  PATIENT GOALS: Get my strength back in my legs.  OBJECTIVE:  Note: Objective measures were completed at Evaluation unless otherwise noted.  DIAGNOSTIC FINDINGS:  Narrative & Impression  CLINICAL DATA:  Left-sided chest pain and shortness of breath for the past week.   EXAM: CT ANGIOGRAPHY CHEST WITH CONTRAST   IMPRESSION: 1. No evidence of pulmonary embolism or other acute intrathoracic process. 2. Aortic Atherosclerosis (ICD10-I70.0) and Emphysema (ICD10-J43.9).     Electronically Signed   By: Elsie ONEIDA Shoulder M.D.   On: 02/14/2023 15:31    COGNITION: Overall cognitive status: Within functional limits for tasks assessed   SENSATION: WFL  COORDINATION: Impaired heel to shin and with movement patterns- slower to repsond  EDEMA:  Not directly measured but wearing compression socks and know history of BLE swelling.    POSTURE: rounded shoulders, forward head, increased lumbar lordosis, and Stiff neck- rotated to left   LOWER EXTREMITY ROM:     Active  Right Eval Left Eval  Hip flexion    Hip extension    Hip abduction    Hip adduction    Hip internal rotation    Hip external rotation    Knee flexion    Knee extension    Ankle dorsiflexion    Ankle plantarflexion    Ankle inversion    Ankle eversion     (Blank rows = not tested)  LOWER EXTREMITY MMT:    MMT Right Eval Left Eval  Hip flexion 4 4  Hip extension    Hip abduction 3+ 4  Hip adduction    Hip internal rotation    Hip external rotation    Knee flexion 4 4  Knee extension 4 4  Ankle dorsiflexion 4 4  Ankle plantarflexion    Ankle inversion    Ankle eversion    (Blank rows = not tested)  BED MOBILITY:  Not tested  TRANSFERS: Sit to stand: CGA  Assistive device utilized: Environmental consultant - 4 wheeled     Stand to sit: CGA  Assistive device utilized: Environmental consultant - 4 wheeled     Chair to chair: CGA  Assistive device utilized: Environmental consultant - 4 wheeled       RAMP:  Not tested  CURB:   Not tested  STAIRS: Not tested GAIT: Findings: Gait Characteristics: step to pattern, decreased step length- Right, decreased step length- Left, decreased stance time- Right, decreased stance time- Left, decreased stride length, decreased ankle dorsiflexion- Right, decreased ankle dorsiflexion- Left, knee flexed in stance- Right, knee flexed in stance- Left, shuffling, decreased trunk rotation, trunk flexed, and wide BOS, Distance walked: 100 feet, Assistive device utilized:Walker - 4 wheeled, Level of assistance: CGA, and Comments:    FUNCTIONAL TESTS:  5 times sit to stand: 11.21 without UE Support  Timed up and go (TUG): 17.85 sec with Rollator 6 minute walk test: To be assessed next visit 10 meter walk test: To be assessed next visit Berg Balance Scale:  Item Test date: 12/06/2023 Date:  Date:   Sitting to standing 4. able to stand without using hands and stabilize independently Insert SmartPhrase OPRCBERGREEVAL Insert SmartPhrase OPRCBERGREEVAL  2. Standing unsupported 4. able to stand safely for 2 minutes    3. Sitting with back unsupported, feet supported 4. able to sit safely and securely for 2 minutes  4. Standing to sitting 4. sits safely with minimal use of hands    5. Pivot transfer  4. able to transfer safely with minor use of hands    6. Standing unsupported with eyes closed 3. able to stand 10 seconds with supervision    7. Standing unsupported with feet together 3. able to place feet together independently and stand 1 minute with supervision    8. Reaching forward with outstretched arms while standing 2. can reach forward 5 cm (2 inches)    9. Pick up object from the floor from standing 0. unable to try/needs assistance to keep from losing balance or falling    10. Turning to look behind over left and right shoulders while standing 2. turns sideways but only maintains balance    11. Turn 360 degrees 1. needs close supervision or verbal cuing    12. Place alternate foot on  step or stool while standing unsupported 2. able to complete 4 steps without aid with supervision    13. Standing unsupported one foot in front 1. needs help to step but can hold 15 seconds    14. Standing on one leg 0. unable to try of needs assist to prevent fall      Total Score 34/56 Total Score:    Total Score:      PATIENT SURVEYS:  ABC scale: to be completed next visit                                                                                                                             TREATMENT DATE: 12/13/2023    30 sec sit to stand test= 12 reps without UE support    The Activities-Specific Balance Confidence (ABC) Scale 0% 10 20 30  40 50 60 70 80 90 100% No confidence<->completely confident  "How confident are you that you will not lose your balance or become unsteady when you . . .   Date tested 12/13/2023  Walk around the house 60%  2. Walk up or down stairs 50%  3. Bend over and pick up a slipper from in front of a closet floor 50%  4. Reach for a small can off a shelf at eye level 100%  5. Stand on tip toes and reach for something above your head 60%  6. Stand on a chair and reach for something 0%  7. Sweep the floor 40%  8. Walk outside the house to a car parked in the driveway 40%  9. Get into or out of a car 70%  10. Walk across a parking lot to the mall 70%  11. Walk up or down a ramp 30%  12. Walk in a crowded mall where people rapidly walk past you 30%  13. Are bumped into by people as you walk through the mall 30%  14. Step onto or off of an escalator while you are holding onto the railing 20%  15. Step onto or off  an escalator while holding onto parcels such that you cannot hold onto the railing 0%  16. Walk outside on icy sidewalks 20%  Total: #/16 42%    6 Min Walk Test:  Instructed patient to ambulate as quickly and as safely as possible for 6 minutes using LRAD. Patient was allowed to take standing rest breaks without stopping the test, but if  the patient required a sitting rest break the clock would be stopped and the test would be over.  Results: 650 feet (198 meters, Avg speed 0.55 m/s) using a rollator with CGA. Results indicate that the patient has reduced endurance with ambulation compared to age matched norms.  Age Matched Norms: 85-69 yo M: 58 F: 83, 21-79 yo M: 49 F: 471, 104-89 yo M: 417 F: 392 MDC: 58.21 meters (190.98 feet) or 50 meters (ANPTA Core Set of Outcome Measures for Adults with Neurologic Conditions, 2018)   10 Meter Walk Test: Patient instructed to walk 10 meters (32.8 ft) as quickly and as safely as possible at their normal speed x2 and at a fast speed x2. Time measured from 2 meter mark to 8 meter mark to accommodate ramp-up and ramp-down.  Normal speed 1: 0.63 m/s Normal speed 2: 0.63 m/s Average Normal speed: 0.63 m/s Cut off scores: <0.4 m/s = household Ambulator, 0.4-0.8 m/s = limited community Ambulator, >0.8 m/s = community Ambulator, >1.2 m/s = crossing a street, <1.0 = increased fall risk MCID 0.05 m/s (small), 0.13 m/s (moderate), 0.06 m/s (significant)  (ANPTA Core Set of Outcome Measures for Adults with Neurologic Conditions, 2018)    Self care/home management:  Reviewed some activities from previous admission- hamstring stretch and seated thoracic rotation to assist with stiffness feeling. Reviewed seated hip march 2 x 10 to work on strength to pick up feet with walking.   PATIENT EDUCATION: Education details: Proposed plan of care Person educated: Patient Education method: Explanation Education comprehension: verbalized understanding  HOME EXERCISE PROGRAM: Reviewed previous HEP from most recent EOC- will modify next 1-2 visits as appropriate.   GOALS: Goals reviewed with patient? Yes  SHORT TERM GOALS: Target date: 01/17/2024  Pt will be independent with a comprehensive HEP in order to improve strength and balance in order to decrease fall risk and improve function at home and work   Baseline: EVAL- Reviewed previously instructed HEP today and patient admits to not performing much of ROM/LE strengthening and has not been consistently able to go to well zone due to difficulty getting out some days. Goal status: INITIAL  LONG TERM GOALS: Target date: 02/28/2024  1.  Patient will complete > 15 reps in 30 sec Sit to stand test indicating an increased LE strength and improved balance. Baseline: EVAL- To be completed visit #2; 12/13/2023= 12 reps Goal status: INITIAL  2.  Patient will improve ABC score by 9 points   to demonstrate statistically significant improvement in mobility and quality of life as it relates to their confidence in their balance.  Baseline: EVAL- To be assessed visit #2 Goal status: INITIAL   3.  Patient will increase Berg Balance score by > 6 points to demonstrate decreased fall risk during functional activities. Baseline: EVAL= 34/56 and when previously discharged back on 04/25/2023= 40/56 Goal status: INITIAL   4.   Patient will reduce timed up and go to <11 seconds to reduce fall risk and demonstrate improved transfer/gait ability. Baseline: EVAL= 17.85 sec w rollator and previously on 04/25/2023 Goal status: INITIAL  4.  Patient will increase 10 meter walk test by 0.2 m/s as to improve gait speed for better community ambulation and to reduce fall risk. Baseline: Eval= to be assessed visit #2 Goal status: INITIAL  5.   Patient will increase six minute walk test distance to >1000 for progression to community ambulator and improve gait ability Baseline: Eval- to be assessed visit #2 and previously 1100 feet upon discharge in 04/2023; 12/13/2023= 650 feet with rollator.  Goal status: INITIAL ASSESSMENT:  CLINICAL IMPRESSION: Patient is a 78 y.o. male who was seen today for physical therapy treatment for shuffling of gait. He presents with limited mobility- decreased scores with 6 min walk and 10 meter walk test. Instructed him to continue to use  his rollator for all mobility. His scores have declined since he was discharged from PT back in Feb and he will benefit from continued balance/mobility training. Patient will benefit from skilled PT services to imrpove his functional mobility, overall strength, and balance to improve his independence in the home and decrease his risk of falling.   OBJECTIVE IMPAIRMENTS: Abnormal gait, cardiopulmonary status limiting activity, decreased activity tolerance, decreased balance, decreased coordination, decreased endurance, decreased mobility, difficulty walking, decreased ROM, decreased strength, hypomobility, postural dysfunction, obesity, and pain.   ACTIVITY LIMITATIONS: carrying, lifting, bending, standing, squatting, stairs, transfers, bed mobility, dressing, and reach over head  PARTICIPATION LIMITATIONS: meal prep, cleaning, laundry, medication management, driving, shopping, community activity, occupation, and yard work  PERSONAL FACTORS: Age, Time since onset of injury/illness/exacerbation, and 3+ comorbidities: OA, Low back pain with sciatica, HTN, Trigeminal Neuralgia are also affecting patient's functional outcome.   REHAB POTENTIAL: Good  CLINICAL DECISION MAKING: Evolving/moderate complexity  EVALUATION COMPLEXITY: Moderate  PLAN:  PT FREQUENCY: 1-2x/week  PT DURATION: 12 weeks  PLANNED INTERVENTIONS: [id number]- PT Re-evaluation, 97750- Physical Performance Testing, 97110-Therapeutic exercises, 97530- Therapeutic activity, [id number]- Neuromuscular re-education, 97535- Self Care, [id number]- Manual therapy, [id number]- Gait training, [id number]- Orthotic Initial, [id number]- Orthotic/Prosthetic subsequent, [phone]- Canalith repositioning, [id number]- Electrical stimulation (manual), [id number]- Ultrasound, 79439 (1-2 muscles), [id number] (3+ muscles)- Dry Needling, Patient/Family education, Balance training, Stair training, Taping, Joint mobilization, Joint manipulation, Spinal manipulation, Spinal mobilization, Vestibular  training, DME instructions, Cryotherapy, and Moist heat  PLAN FOR NEXT SESSION:   Progress LE strength,  Try unweighing gait training as appropriate.  Stress reciprocal gait and add intensity to gait as appropriate.   Reyes LOISE London, PT 12/14/2023, 10:44 AM

## 2023-12-13 ENCOUNTER — Ambulatory Visit

## 2023-12-13 DIAGNOSIS — R262 Difficulty in walking, not elsewhere classified: Secondary | ICD-10-CM

## 2023-12-13 DIAGNOSIS — M6281 Muscle weakness (generalized): Secondary | ICD-10-CM | POA: Diagnosis not present

## 2023-12-13 DIAGNOSIS — R278 Other lack of coordination: Secondary | ICD-10-CM

## 2023-12-13 DIAGNOSIS — R2681 Unsteadiness on feet: Secondary | ICD-10-CM

## 2023-12-13 DIAGNOSIS — R2689 Other abnormalities of gait and mobility: Secondary | ICD-10-CM

## 2023-12-13 DIAGNOSIS — R269 Unspecified abnormalities of gait and mobility: Secondary | ICD-10-CM

## 2023-12-13 DIAGNOSIS — G8929 Other chronic pain: Secondary | ICD-10-CM

## 2023-12-16 ENCOUNTER — Ambulatory Visit: Admitting: Physical Therapy

## 2023-12-16 DIAGNOSIS — M6281 Muscle weakness (generalized): Secondary | ICD-10-CM

## 2023-12-16 DIAGNOSIS — R2681 Unsteadiness on feet: Secondary | ICD-10-CM

## 2023-12-16 DIAGNOSIS — R269 Unspecified abnormalities of gait and mobility: Secondary | ICD-10-CM

## 2023-12-16 DIAGNOSIS — R262 Difficulty in walking, not elsewhere classified: Secondary | ICD-10-CM

## 2023-12-16 DIAGNOSIS — R2689 Other abnormalities of gait and mobility: Secondary | ICD-10-CM

## 2023-12-16 NOTE — Therapy (Signed)
 OUTPATIENT PHYSICAL THERAPY NEURO TREATMENT   Patient Name: [name] MRN: [id number] DOB:1945/05/06, 78 y.o., male Today's Date: 12/16/2023   PCP: Damien Ryder, PA-C REFERRING PROVIDER: Dr. Arthea Farrow  END OF SESSION:  PT End of Session - 12/16/23 0847     Visit Number 3    Number of Visits 24    Date for Recertification  02/28/24    Progress Note Due on Visit 10    PT Start Time 0843    PT Stop Time 0925    PT Time Calculation (min) 42 min    Equipment Utilized During Treatment Gait belt    Activity Tolerance Patient tolerated treatment well    Behavior During Therapy [facility] for tasks assessed/performed            Past Medical History:  Diagnosis Date   Anxiety    Arteriosclerosis of coronary artery 11/16/2011   Overview:  Stent 10/2011 stent rca 2015 with collaterals to lad which is chronically occluded    Benign enlargement of prostate    Benign essential HTN 06/11/2014   Benign prostatic hypertrophy without urinary obstruction 07/31/2014   Bilateral cataracts 05/16/2013   Overview:  [facility]     Bone spur of foot    Left   BP (high blood pressure) 11/09/2012   Cancer (HCC)    skin (forehead) and bladder   Carotid artery narrowing 02/08/2014   Depression    Detrusor hypertrophy    Diabetes (HCC)    Diabetes mellitus, type 2 (HCC) 12/12/2012   Diverticulosis    Dyspnea    Esophageal reflux    Esophageal reflux    Fothergill's neuralgia 08/07/2012   Overview:  [facility]    Gastritis    GERD (gastroesophageal reflux disease)    Headache    cluster headaches   Healed myocardial infarct 11/09/2012   Hearing loss in left ear    Heart disease    Hematuria    Hemorrhoids    History of hiatal hernia 12/14/2017   small    Hypercholesteremia    Lesion of bladder    Myocardial infarct (HCC)    Presence of stent in coronary artery 11/09/2012   Rectal bleeding    Trigeminal neuralgia    Trigeminal neuralgia    Valvular heart disease     Vitamin D deficiency    Past Surgical History:  Procedure Laterality Date   APPENDECTOMY     BOTOX  INJECTION N/A 12/21/2017   Procedure: Bladder BOTOX  INJECTION;  Surgeon: Penne Knee, MD;  Location: [facility] ORS;  Service: Urology;  Laterality: N/A;   BOTOX  INJECTION N/A 09/11/2018   Procedure: Bladder BOTOX  INJECTION;  Surgeon: Penne Knee, MD;  Location: [facility] ORS;  Service: Urology;  Laterality: N/A;   CARDIAC CATHETERIZATION     CARDIAC CATHETERIZATION N/A 01/22/2015   Procedure: Left Heart Cath;  Surgeon: Wolm JINNY Rhyme, MD;  Location: [facility] INVASIVE CV LAB;  Service: Cardiovascular;  Laterality: N/A;   CARDIAC CATHETERIZATION N/A 01/22/2015   Procedure: Coronary Stent Intervention;  Surgeon: Marsa Dooms, MD;  Location: [facility] INVASIVE CV LAB;  Service: Cardiovascular;  Laterality: N/A;   CATARACT EXTRACTION, BILATERAL     COLONOSCOPY WITH PROPOFOL  N/A 12/08/2015   Procedure: COLONOSCOPY WITH PROPOFOL ;  Surgeon: Gladis RAYMOND Mariner, MD;  Location: [facility] ENDOSCOPY;  Service: Endoscopy;  Laterality: N/A;   COLONOSCOPY WITH PROPOFOL  N/A 12/09/2015   Procedure: COLONOSCOPY WITH PROPOFOL ;  Surgeon: Gladis RAYMOND Mariner, MD;  Location: [facility] ENDOSCOPY;  Service: Endoscopy;  Laterality: N/A;   CORONARY ANGIOPLASTY     5 stents   CORONARY STENT PLACEMENT  2015   x5   CYSTOSCOPY N/A 09/11/2018   Procedure: CYSTOSCOPY;  Surgeon: Penne Knee, MD;  Location: [facility] ORS;  Service: Urology;  Laterality: N/A;   CYSTOSCOPY WITH BIOPSY N/A 12/21/2017   Procedure: CYSTOSCOPY WITH BIOPSY;  Surgeon: Penne Knee, MD;  Location: [facility] ORS;  Service: Urology;  Laterality: N/A;   ESOPHAGOGASTRODUODENOSCOPY (EGD) WITH PROPOFOL  N/A 12/08/2015   Procedure: ESOPHAGOGASTRODUODENOSCOPY (EGD) WITH PROPOFOL ;  Surgeon: Gladis RAYMOND Mariner, MD;  Location: [facility] ENDOSCOPY;  Service: Endoscopy;  Laterality: N/A;   ESOPHAGOGASTRODUODENOSCOPY (EGD) WITH PROPOFOL  N/A 12/13/2017   Procedure:  ESOPHAGOGASTRODUODENOSCOPY (EGD) WITH PROPOFOL ;  Surgeon: Mariner Gladis RAYMOND, MD;  Location: [facility] ENDOSCOPY;  Service: Endoscopy;  Laterality: N/A;   EYE SURGERY     HERNIA REPAIR     kidney tumor remove     TRANSURETHRAL RESECTION OF BLADDER TUMOR WITH GYRUS (TURBT-GYRUS)  12/2013   UMBILICAL HERNIA REPAIR     urethral meatotomy     Patient Active Problem List   Diagnosis Date Noted   Major depressive disorder, recurrent, in full remission 06/29/2023   Osteoarthritis of right hip 04/06/2023   Spinal stenosis of lumbar region 12/22/2022   Pulmonary emphysema (HCC) 12/21/2022   Arthropathy of lumbar facet joint 11/19/2022   Pain in joint of right shoulder 11/19/2022   Sacroiliac joint pain 11/19/2022   Acute hypoxemic respiratory failure (HCC) 03/18/2022   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 03/18/2022   Fall 03/18/2022   Overactive bladder 03/18/2022   Bilateral leg edema 09/16/2021   Anxiety 04/01/2021   History of bladder cancer 04/01/2021   Parkinson disease (HCC) 04/01/2021   Inflammatory pain 02/18/2021   Neck pain 02/18/2021   Class 2 obesity due to excess calories with body mass index (BMI) of 36.0 to 36.9 in adult 04/11/2020   Swelling of limb 03/28/2020   Lymphedema 03/28/2020   Trigeminal neuralgia 12/25/2019   Lower limb ulcer, calf, left, limited to breakdown of skin (HCC) 12/25/2019   OSA (obstructive sleep apnea) 01/23/2019   Acute systolic CHF (congestive heart failure) (HCC) 12/12/2018   Bruising 07/10/2018   Diet-controlled type 2 diabetes mellitus (HCC) 03/24/2018   Microalbuminuria 03/24/2018   Dizziness 11/17/2017   Simple chronic bronchitis (HCC) 09/22/2017   SOBOE (shortness of breath on exertion) 02/18/2017   Chronic midline low back pain without sciatica 12/29/2016   Periodic limb movement disorder 12/29/2016   Benign essential tremor 06/22/2016   Pure hypercholesterolemia 06/20/2015   Erectile dysfunction due to arterial  insufficiency 06/16/2015   Unstable angina (HCC) 01/22/2015   Abdominal aortic 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 rupture 12/31/2014   Benign prostatic hyperplasia without urinary obstruction 07/31/2014   Enlarged prostate 07/31/2014   Benign essential HTN 06/11/2014   Carotid artery narrowing 02/08/2014   Carotid artery obstruction 02/08/2014   Bilateral carotid artery stenosis 02/08/2014   Chest pain 08/20/2013   Bilateral cataracts 05/16/2013   Cataract 05/16/2013   Clinical depression 12/12/2012   Diabetes mellitus, type 2 (HCC) 12/12/2012   Combined fat and carbohydrate induced hyperlipemia 12/12/2012   Major depressive disorder, single episode, unspecified 12/12/2012   Acid reflux 11/09/2012   Presence of stent in coronary artery 11/09/2012   BP (high blood pressure) 11/09/2012   Healed myocardial infarct 11/09/2012   Gastro-esophageal reflux disease without esophagitis 11/09/2012   History of cardiovascular surgery 11/09/2012   H/O heart artery stent 11/09/2012  Fothergill's neuralgia 08/07/2012   Swelling of testicle 04/06/2012   Disorder of male genital organ 04/06/2012   Swelling of the testicles 04/06/2012   Arteriosclerosis of coronary artery 11/16/2011   CAD in native artery 11/16/2011   Fatigue 06/04/2011   Avitaminosis D 11/30/2010    ONSET DATE: 07/2023  REFERRING DIAG: R26.89 (ICD-10-CM) - Shuffling gait   THERAPY DIAG:  Muscle weakness (generalized)  Unsteadiness on feet  Difficulty in walking, not elsewhere classified  Other abnormalities of gait and mobility  Abnormality of gait and mobility  Rationale for Evaluation and Treatment: Rehabilitation  SUBJECTIVE:                                                                                                                                                                                             SUBJECTIVE STATEMENT: Today: Patient reports moving around okay- still stiff and slow going. No notable changes  since last visit.    From EVAL: I was trying to keep going with my exercises- going to well zone but just couldn't go consistent and one thing after another. I feel weak again and difficulty getting around. Wife also endorses his increased difficulty moving around in home and limited community outings.   Pt accompanied by: significant other  PERTINENT HISTORY:  Patient is 78 year old male well known to clinic with PMH: Parkinsons, HTN, cervical spondylosis, CAD, Spinal stenosis, DM type 2, OSA. Patient has completed previous episodes of care with successful results including modified independence with use of walker in community and presents back today after discharge back in Feb 2024- going to well [facility] some when able.   Per Neurology latest visit on 11/02/2023 Patient reports Patient with worsening gait, balance and lower extremity weakness. Increased difficulty with transitions, getting out of bed and chair. Vivid dreams and acting out dreams at night. Visual hallucinations occur sporadically, not distressing. Worsening tremors in bilateral hands with movement. Difficulty with fine motor movements and grip strength. - Memory evaluation from 08/22/2023 was 30/30. Repeat at next visit.  - Referral to PT for gait and balance.   PAIN:  Are you having pain? Yes: NPRS scale: 3-4/10 B hand pain Pain location: B hands Pain description: achy Aggravating factors: Bumping hand into hand, working with my hands Relieving factors: Rest  PRECAUTIONS: Fall  RED FLAGS: None   WEIGHT BEARING RESTRICTIONS: No  FALLS: Has patient fallen in last 6 months? No  LIVING ENVIRONMENT: Lives with: lives with their spouse Lives in: House/apartment Stairs: Yes: External: 5 steps; 2 steps in garage with handle on Right side. Has following equipment at home: Vannie - 2  wheeled, Walker - 4 wheeled, and Upright walker   PLOF: Independent with gait, Needs assistance with ADLs, and Needs assistance with  homemaking  PATIENT GOALS: Get my strength back in my legs.  OBJECTIVE:  Note: Objective measures were completed at Evaluation unless otherwise noted.  DIAGNOSTIC FINDINGS:  Narrative & Impression  CLINICAL DATA:  Left-sided chest pain and shortness of breath for the past week.   EXAM: CT ANGIOGRAPHY CHEST WITH CONTRAST   IMPRESSION: 1. No evidence of pulmonary embolism or other acute intrathoracic process. 2. Aortic Atherosclerosis (ICD10-I70.0) and Emphysema (ICD10-J43.9).     Electronically Signed   By: Elsie ONEIDA Shoulder M.D.   On: 02/14/2023 15:31    COGNITION: Overall cognitive status: Within functional limits for tasks assessed   SENSATION: WFL  COORDINATION: Impaired heel to shin and with movement patterns- slower to repsond  EDEMA:  Not directly measured but wearing compression socks and know history of BLE swelling.    POSTURE: rounded shoulders, forward head, increased lumbar lordosis, and Stiff neck- rotated to left   LOWER EXTREMITY ROM:     Active  Right Eval Left Eval  Hip flexion    Hip extension    Hip abduction    Hip adduction    Hip internal rotation    Hip external rotation    Knee flexion    Knee extension    Ankle dorsiflexion    Ankle plantarflexion    Ankle inversion    Ankle eversion     (Blank rows = not tested)  LOWER EXTREMITY MMT:    MMT Right Eval Left Eval  Hip flexion 4 4  Hip extension    Hip abduction 3+ 4  Hip adduction    Hip internal rotation    Hip external rotation    Knee flexion 4 4  Knee extension 4 4  Ankle dorsiflexion 4 4  Ankle plantarflexion    Ankle inversion    Ankle eversion    (Blank rows = not tested)  BED MOBILITY:  Not tested  TRANSFERS: Sit to stand: CGA  Assistive device utilized: Environmental consultant - 4 wheeled     Stand to sit: CGA  Assistive device utilized: Environmental consultant - 4 wheeled     Chair to chair: CGA  Assistive device utilized: Environmental consultant - 4 wheeled       RAMP:  Not tested  CURB:  Not  tested  STAIRS: Not tested GAIT: Findings: Gait Characteristics: step to pattern, decreased step length- Right, decreased step length- Left, decreased stance time- Right, decreased stance time- Left, decreased stride length, decreased ankle dorsiflexion- Right, decreased ankle dorsiflexion- Left, knee flexed in stance- Right, knee flexed in stance- Left, shuffling, decreased trunk rotation, trunk flexed, and wide BOS, Distance walked: 100 feet, Assistive device utilized:Walker - 4 wheeled, Level of assistance: CGA, and Comments:    FUNCTIONAL TESTS:  5 times sit to stand: 11.21 without UE Support  Timed up and go (TUG): 17.85 sec with Rollator 6 minute walk test: To be assessed next visit 10 meter walk test: To be assessed next visit Berg Balance Scale:  Item Test date: 12/06/2023 Date:  Date:   Sitting to standing 4. able to stand without using hands and stabilize independently Insert SmartPhrase OPRCBERGREEVAL Insert SmartPhrase OPRCBERGREEVAL  2. Standing unsupported 4. able to stand safely for 2 minutes    3. Sitting with back unsupported, feet supported 4. able to sit safely and securely for 2 minutes    4. Standing to sitting 4. sits  safely with minimal use of hands    5. Pivot transfer  4. able to transfer safely with minor use of hands    6. Standing unsupported with eyes closed 3. able to stand 10 seconds with supervision    7. Standing unsupported with feet together 3. able to place feet together independently and stand 1 minute with supervision    8. Reaching forward with outstretched arms while standing 2. can reach forward 5 cm (2 inches)    9. Pick up object from the floor from standing 0. unable to try/needs assistance to keep from losing balance or falling    10. Turning to look behind over left and right shoulders while standing 2. turns sideways but only maintains balance    11. Turn 360 degrees 1. needs close supervision or verbal cuing    12. Place alternate foot on step  or stool while standing unsupported 2. able to complete 4 steps without aid with supervision    13. Standing unsupported one foot in front 1. needs help to step but can hold 15 seconds    14. Standing on one leg 0. unable to try of needs assist to prevent fall      Total Score 34/56 Total Score:    Total Score:      PATIENT SURVEYS:  ABC scale: to be completed next visit                                                                                                                             TREATMENT DATE: 12/13/2023 NMR: To facilitate reeducation of movement, balance, posture, coordination, and/or proprioception/kinesthetic sense.     Nustep rolling hills setting for B UE and LE reciprocal movement training and aerobic priming. Level 2-5 x 8 min   STS PWR! Up 2 x 10 reps- heavy cues for sequencing  TE- To improve strength, endurance, mobility, and function of specific targeted muscle groups or improve joint range of motion or improve muscle flexibility  Reviewed stretches form previous HEP  Seated thoracic rotation stretch 2 x 30 sec ea LE   Attempted seated figure 4 pt pt too stiff in hips to obtain position without significant effort - performed standing exercises below to open hips   Seated lumbar flexion stretch x 30 sec  - also instructed pt in importance of stretch hold times and why stretching is so important with his diagnosis.   TE- To improve strength, endurance, mobility, and function of specific targeted muscle groups or improve joint range of motion or improve muscle flexibility  Standing march x 10 with UE  Standing open and close the gait 2 x 10 with UE  Standing PWR! Step 2 x 5 ea side with UE support    PATIENT EDUCATION: Education details: Proposed plan of care Person educated: Patient Education method: Explanation Education comprehension: verbalized understanding  HOME EXERCISE PROGRAM: Reviewed previous HEP from most recent EOC- will modify next 1-2  visits  as appropriate.   GOALS: Goals reviewed with patient? Yes  SHORT TERM GOALS: Target date: 01/17/2024  Pt will be independent with a comprehensive HEP in order to improve strength and balance in order to decrease fall risk and improve function at home and work  Baseline: EVAL- Reviewed previously instructed HEP today and patient admits to not performing much of ROM/LE strengthening and has not been consistently able to go to well zone due to difficulty getting out some days. Goal status: INITIAL  LONG TERM GOALS: Target date: 02/28/2024  1.  Patient will complete > 15 reps in 30 sec Sit to stand test indicating an increased LE strength and improved balance. Baseline: EVAL- To be completed visit #2; 12/13/2023= 12 reps Goal status: INITIAL  2.  Patient will improve ABC score by 9 points   to demonstrate statistically significant improvement in mobility and quality of life as it relates to their confidence in their balance.  Baseline: EVAL- To be assessed visit #2 Goal status: INITIAL   3.  Patient will increase Berg Balance score by > 6 points to demonstrate decreased fall risk during functional activities. Baseline: EVAL= 34/56 and when previously discharged back on 04/25/2023= 40/56 Goal status: INITIAL   4.   Patient will reduce timed up and go to <11 seconds to reduce fall risk and demonstrate improved transfer/gait ability. Baseline: EVAL= 17.85 sec w rollator and previously on 04/25/2023 Goal status: INITIAL  4.   Patient will increase 10 meter walk test by 0.2 m/s as to improve gait speed for better community ambulation and to reduce fall risk. Baseline: Eval= to be assessed visit #2 Goal status: INITIAL  5.   Patient will increase six minute walk test distance to >1000 for progression to community ambulator and improve gait ability Baseline: Eval- to be assessed visit #2 and previously 1100 feet upon discharge in 04/2023; 12/13/2023= 650 feet with rollator.  Goal  status: INITIAL ASSESSMENT:  CLINICAL IMPRESSION:  Patient arrived with good motivation for completion of pt activities.  Pt participates in some PWR! Moves for improved ability to rise form chair and improved hip mobility. Consider adding some basic PWR! Moves for completion at home if indicated and if pt able to perform with good form and safety. PWR! Moves target bradykinesia, rigidity, and dyskinesia through targeted functional movements that address four core movement difficulties for people with Parkinson's disease. Pt will continue to benefit from skilled physical therapy intervention to address impairments, improve QOL, and attain therapy goals.    OBJECTIVE IMPAIRMENTS: Abnormal gait, cardiopulmonary status limiting activity, decreased activity tolerance, decreased balance, decreased coordination, decreased endurance, decreased mobility, difficulty walking, decreased ROM, decreased strength, hypomobility, postural dysfunction, obesity, and pain.   ACTIVITY LIMITATIONS: carrying, lifting, bending, standing, squatting, stairs, transfers, bed mobility, dressing, and reach over head  PARTICIPATION LIMITATIONS: meal prep, cleaning, laundry, medication management, driving, shopping, community activity, occupation, and yard work  PERSONAL FACTORS: Age, Time since onset of injury/illness/exacerbation, and 3+ comorbidities: OA, Low back pain with sciatica, HTN, Trigeminal Neuralgia are also affecting patient's functional outcome.   REHAB POTENTIAL: Good  CLINICAL DECISION MAKING: Evolving/moderate complexity  EVALUATION COMPLEXITY: Moderate  PLAN:  PT FREQUENCY: 1-2x/week  PT DURATION: 12 weeks  PLANNED INTERVENTIONS: [id number]- PT Re-evaluation, 97750- Physical Performance Testing, 97110-Therapeutic exercises, 97530- Therapeutic activity, [id number]- Neuromuscular re-education, 97535- Self Care, [id number]- Manual therapy, [id number]- Gait training, [id number]- Orthotic Initial, [id number]- Orthotic/Prosthetic  subsequent, [id number]- Canalith repositioning, [id number]- Electrical stimulation (manual), [id number]- Ultrasound, 79439 (1-2 muscles), 20561 (3+ muscles)-  Dry Needling, Patient/Family education, Balance training, Stair training, Taping, Joint mobilization, Joint manipulation, Spinal manipulation, Spinal mobilization, Vestibular training, DME instructions, Cryotherapy, and Moist heat  PLAN FOR NEXT SESSION:   Progress LE strength,  Try unweighing gait training as appropriate.  Stress reciprocal gait and add intensity to gait as appropriate.   Lonni KATHEE Gainer, PT 12/16/2023, 8:47 AM

## 2023-12-20 ENCOUNTER — Ambulatory Visit: Admitting: Physical Therapy

## 2023-12-23 ENCOUNTER — Ambulatory Visit: Admitting: Physical Therapy

## 2023-12-27 ENCOUNTER — Ambulatory Visit

## 2023-12-29 ENCOUNTER — Ambulatory Visit

## 2024-01-02 ENCOUNTER — Ambulatory Visit

## 2024-01-04 ENCOUNTER — Ambulatory Visit

## 2024-01-11 ENCOUNTER — Ambulatory Visit

## 2024-01-13 ENCOUNTER — Ambulatory Visit: Admitting: Physical Therapy

## 2024-01-17 ENCOUNTER — Ambulatory Visit

## 2024-01-20 ENCOUNTER — Ambulatory Visit: Admitting: Physical Therapy

## 2024-01-23 ENCOUNTER — Ambulatory Visit

## 2024-01-25 ENCOUNTER — Ambulatory Visit: Admitting: Physical Therapy

## 2024-01-30 ENCOUNTER — Ambulatory Visit

## 2024-02-01 ENCOUNTER — Ambulatory Visit

## 2024-02-02 ENCOUNTER — Encounter: Payer: Self-pay | Admitting: Dermatology

## 2024-02-02 ENCOUNTER — Ambulatory Visit: Admitting: Dermatology

## 2024-02-02 DIAGNOSIS — D0439 Carcinoma in situ of skin of other parts of face: Secondary | ICD-10-CM

## 2024-02-02 DIAGNOSIS — D492 Neoplasm of unspecified behavior of bone, soft tissue, and skin: Secondary | ICD-10-CM

## 2024-02-02 DIAGNOSIS — L219 Seborrheic dermatitis, unspecified: Secondary | ICD-10-CM

## 2024-02-02 DIAGNOSIS — D099 Carcinoma in situ, unspecified: Secondary | ICD-10-CM

## 2024-02-02 HISTORY — DX: Carcinoma in situ, unspecified: D09.9

## 2024-02-02 NOTE — Patient Instructions (Addendum)
 Wound Care Instructions  Cleanse wound gently with soap and water once a day then pat dry with clean gauze. Apply a thin coat of Petrolatum  (petroleum jelly, Vaseline) over the wound (unless you have an allergy to this). We recommend that you use a new, sterile tube of Vaseline. Do not pick or remove scabs. Do not remove the yellow or white healing tissue from the base of the wound.  Cover the wound with fresh, clean, nonstick gauze and secure with paper tape. You may use Band-Aids in place of gauze and tape if the wound is small enough, but would recommend trimming much of the tape off as there is often too much. Sometimes Band-Aids can irritate the skin.  You should call the office for your biopsy report after 1 week if you have not already been contacted.  If you experience any problems, such as abnormal amounts of bleeding, swelling, significant bruising, significant pain, or evidence of infection, please call the office immediately.  FOR ADULT SURGERY PATIENTS: If you need something for pain relief you may take 1 extra strength Tylenol  (acetaminophen ) AND 2 Ibuprofen (200mg  each) together every 4 hours as needed for pain. (do not take these if you are allergic to them or if you have a reason you should not take them.) Typically, you may only need pain medication for 1 to 3 days.       Recommend daily broad spectrum sunscreen SPF 30+ to sun-exposed areas, reapply every 2 hours as needed. Call for new or changing lesions.  Staying in the shade or wearing long sleeves, sun glasses (UVA+UVB protection) and wide brim hats (4-inch brim around the entire circumference of the hat) are also recommended for sun protection.      Due to recent changes in healthcare laws, you may see results of your pathology and/or laboratory studies on MyChart before the doctors have had a chance to review them. We understand that in some cases there may be results that are confusing or concerning to you. Please  understand that not all results are received at the same time and often the doctors may need to interpret multiple results in order to provide you with the best plan of care or course of treatment. Therefore, we ask that you please give us  2 business days to thoroughly review all your results before contacting the office for clarification. Should we see a critical lab result, you will be contacted sooner.   If You Need Anything After Your Visit  If you have any questions or concerns for your doctor, please call our main line at [phone] and press option 4 to reach your doctor's medical assistant. If no one answers, please leave a voicemail as directed and we will return your call as soon as possible. Messages left after 4 pm will be answered the following business day.   You may also send us  a message via MyChart. We typically respond to MyChart messages within 1-2 business days.  For prescription refills, please ask your pharmacy to contact our office. Our fax number is [phone].  If you have an urgent issue when the clinic is closed that cannot wait until the next business day, you can page your doctor at the number below.    Please note that while we do our best to be available for urgent issues outside of office hours, we are not available 24/7.   If you have an urgent issue and are unable to reach us , you may choose to seek medical  care at your doctor's office, retail clinic, urgent care center, or emergency room.  If you have a medical emergency, please immediately call 911 or go to the emergency department.  Pager Numbers  - Dr. Hester: [phone]  - Dr. Jackquline: [phone]  - Dr. Claudene: [phone]   - Dr. Raymund: [phone]  In the event of inclement weather, please call our main line at [phone] for an update on the status of any delays or closures.  Dermatology Medication Tips: Please keep the boxes that topical medications come in in order to help keep  track of the instructions about where and how to use these. Pharmacies typically print the medication instructions only on the boxes and not directly on the medication tubes.   If your medication is too expensive, please contact our office at [phone] option 4 or send us  a message through MyChart.   We are unable to tell what your co-pay for medications will be in advance as this is different depending on your insurance coverage. However, we may be able to find a substitute medication at lower cost or fill out paperwork to get insurance to cover a needed medication.   If a prior authorization is required to get your medication covered by your insurance company, please allow us  1-2 business days to complete this process.  Drug prices often vary depending on where the prescription is filled and some pharmacies may offer cheaper prices.  The website www.goodrx.com contains coupons for medications through different pharmacies. The prices here do not account for what the cost may be with help from insurance (it may be cheaper with your insurance), but the website can give you the price if you did not use any insurance.  - You can print the associated coupon and take it with your prescription to the pharmacy.  - You may also stop by our office during regular business hours and pick up a GoodRx coupon card.  - If you need your prescription sent electronically to a different pharmacy, notify our office through [facility] or by phone at [phone] option 4.     Si Usted Necesita Algo Despus de Su Visita  Tambin puede enviarnos un mensaje a travs de Clinical cytogeneticist. Por lo general respondemos a los mensajes de MyChart en el transcurso de 1 a 2 das hbiles.  Para renovar recetas, por favor pida a su farmacia que se ponga en contacto con nuestra oficina. Randi lakes de fax es [place] [phone].  Si tiene un asunto urgente cuando la clnica est cerrada y que no puede esperar hasta el  siguiente da hbil, puede llamar/localizar a su doctor(a) al nmero que aparece a continuacin.   Por favor, tenga en cuenta que aunque hacemos todo lo posible para estar disponibles para asuntos urgentes fuera del horario de [place], no estamos disponibles las 24 horas del da, los 7 [address]  [address] de la [place].   Si tiene un problema urgente y no puede comunicarse con nosotros, puede optar por buscar atencin mdica  en el consultorio de su doctor(a), en una clnica privada, en un centro de atencin urgente o en una sala de emergencias.  Si tiene Engineer, drilling, por favor llame inmediatamente al 911 o vaya a la sala de emergencias.  Nmeros de bper  - Dr. Hester: [phone]  - Dra. Jackquline: [phone]  - Dr. Claudene: [phone]  - Dra. Kitts: [phone]  En caso de inclemencias del [place], por favor llame a nuestra lnea principal al [phone] para una actualizacin [employer]  estado de cualquier retraso o cierre.  Consejos para la medicacin en dermatologa: Por favor, guarde las cajas en las que vienen los medicamentos de uso tpico para ayudarle a seguir las instrucciones sobre dnde y cmo usarlos. Las farmacias generalmente imprimen las instrucciones del medicamento slo en las cajas y no directamente en los tubos del [place].   Si su medicamento es muy caro, por favor, pngase en contacto con landry rieger llamando al [phone] y presione la opcin 4 o envenos un mensaje a travs de Clinical cytogeneticist.   No podemos decirle cul ser su copago por los medicamentos por adelantado ya que esto es diferente dependiendo de la cobertura de su seguro. Sin embargo, es posible que podamos encontrar un medicamento sustituto a Audiological scientist un formulario para que el seguro cubra el medicamento que se considera necesario.   Si se requiere una autorizacin previa para que su compaa de seguros malta su medicamento, por favor permtanos de 1 a 2 das hbiles para completar este  proceso.  Los precios de los medicamentos varan con frecuencia dependiendo del Environmental consultant de dnde se surte la receta y alguna farmacias pueden ofrecer precios ms baratos.  El sitio web www.goodrx.com tiene cupones para medicamentos de Health and safety inspector. Los precios aqu no tienen en cuenta lo que podra costar con la ayuda del seguro (puede ser ms barato con su seguro), pero el sitio web puede darle el precio si no utiliz Tourist information centre manager.  - Puede imprimir el cupn correspondiente y llevarlo con su receta a la farmacia.  - Tambin puede pasar por nuestra oficina durante el horario de atencin regular y Education officer, museum una tarjeta de cupones de GoodRx.  - Si necesita que su receta se enve electrnicamente a una farmacia diferente, informe a nuestra oficina a travs de MyChart de [place] o por telfono llamando al [phone] y presione la opcin 4.

## 2024-02-02 NOTE — Progress Notes (Signed)
 Follow-Up Visit   Subjective  [name] is a 78 y.o. male who presents for the following: Seborrheic dermatitis. 3 month follow up. Using Clobetasol  solution and ketoconazole  shampoo on scalp as directed. Using Ketoconazole  cream and HC 2.5% cream as directed. States he still has flares from time to time but is greatly improved.   3 month follow up for AK at left mid [place]. Biopsied 11/03/2023. Patient states area is still very painful.     The following portions of the chart were reviewed this encounter and updated as appropriate: medications, allergies, medical history  Review of Systems:  No other skin or systemic complaints except as noted in HPI or Assessment and Plan.  Objective  Well appearing patient in no apparent distress; mood and affect are within normal limits.  Areas Examined: Scalp, ears and face  Relevant physical exam findings are noted in the Assessment and Plan.  Left Mid [place] 1 cm atrophic scaly plaque with 3 mm pink scaly papule superior    SEBORRHEIC DERMATITIS, could not get zoryve  approved Exam: Pink patches with greasy scale at scalp, significantly improved. Clearance on face.  Chronic and persistent condition with duration or expected duration over one year. Condition is improving with treatment but not currently at goal.   Seborrheic Dermatitis  -  is a chronic persistent rash characterized by pinkness and scaling most commonly of the mid face but also can occur on the scalp (dandruff), ears; mid chest, mid back and groin.  It tends to be exacerbated by stress and cooler weather.  People who have neurologic disease may experience new onset or exacerbation of existing seborrheic dermatitis.  The condition is not curable but treatable and can be controlled.  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 Your physician has selected the right strength medicine for your problem and area affected on the body. Please use your medication only as directed by your physician to prevent side effects.   Assessment & Plan   NEOPLASM OF SKIN Left Mid [place] Skin / nail biopsy Type of biopsy: tangential   Informed consent: discussed and consent obtained   Timeout: patient name, date of birth, surgical site, and procedure verified   Procedure prep:  Patient was prepped and draped in usual sterile fashion Prep type:  Isopropyl alcohol Anesthesia: the lesion was anesthetized in a standard fashion   Anesthetic:  1% lidocaine  w/ epinephrine 1-100,000 buffered w/ 8.4% NaHCO3 Instrument used: DermaBlade   Hemostasis achieved with: pressure and aluminum chloride   Outcome: patient tolerated procedure well   Post-procedure details: sterile dressing applied and wound care instructions given   Dressing type: bandage and petrolatum    Specimen 1 - Surgical pathology Differential Diagnosis: AK vs SCC vs rosacea  Check Margins: No  Previous pathology: [id number] 2 pieces of specimen in bottle.  SEBORRHEIC DERMATITIS   Related Medications ketoconazole  (NIZORAL ) 2 % shampoo apply three times per week, massage into scalp  and leave in for 10 minutes before rinsing out. Use as directed in printout ketoconazole  (NIZORAL ) 2 % cream Apply 1 Application topically 2 (two) times daily. Use as directed in print out clobetasol  (TEMOVATE )  0.05 % external solution Apply to aa's scalp QD-BID PRN. Roflumilast  (ZORYVE ) 0.3 % FOAM Apply 2 g topically in the morning and at bedtime. Apply 2 grams twice daily to affected areas of skin hydrocortisone  2.5 % cream Apply to rash on face ears and neck twice per day as needed until resolution  Return in about 6 months (around 08/02/2024) for Seborrheic Dermatitis Follow Up, AK Follow Up.  I, Kate Fought, CMA, am acting as scribe for Boneta Sharps, MD.   Documentation: I have reviewed the above documentation for accuracy and completeness, and I agree with the above.  Boneta Sharps, MD

## 2024-02-06 ENCOUNTER — Ambulatory Visit

## 2024-02-06 LAB — SURGICAL PATHOLOGY

## 2024-02-07 ENCOUNTER — Ambulatory Visit: Payer: Self-pay | Admitting: Dermatology

## 2024-02-07 DIAGNOSIS — D099 Carcinoma in situ, unspecified: Secondary | ICD-10-CM

## 2024-02-07 NOTE — Telephone Encounter (Signed)
 Discussed pathology results and treatment options. Patient prefers Mohs surgery. Referral sent to Dr. Corey.

## 2024-02-08 ENCOUNTER — Ambulatory Visit

## 2024-02-13 ENCOUNTER — Ambulatory Visit

## 2024-02-15 ENCOUNTER — Ambulatory Visit

## 2024-02-20 ENCOUNTER — Ambulatory Visit: Admitting: Physical Therapy

## 2024-02-20 ENCOUNTER — Ambulatory Visit

## 2024-02-22 ENCOUNTER — Ambulatory Visit: Attending: Neurology

## 2024-02-22 ENCOUNTER — Ambulatory Visit

## 2024-02-27 ENCOUNTER — Ambulatory Visit

## 2024-02-29 ENCOUNTER — Ambulatory Visit

## 2024-03-05 ENCOUNTER — Ambulatory Visit

## 2024-03-07 ENCOUNTER — Ambulatory Visit

## 2024-03-07 ENCOUNTER — Ambulatory Visit: Attending: Neurology

## 2024-03-09 ENCOUNTER — Ambulatory Visit: Admitting: Physical Therapy

## 2024-03-12 ENCOUNTER — Ambulatory Visit

## 2024-03-14 ENCOUNTER — Ambulatory Visit

## 2024-03-15 ENCOUNTER — Ambulatory Visit

## 2024-03-19 ENCOUNTER — Ambulatory Visit

## 2024-03-20 ENCOUNTER — Ambulatory Visit

## 2024-03-21 ENCOUNTER — Ambulatory Visit

## 2024-03-22 ENCOUNTER — Ambulatory Visit

## 2024-03-26 ENCOUNTER — Ambulatory Visit

## 2024-03-27 ENCOUNTER — Ambulatory Visit

## 2024-03-28 ENCOUNTER — Ambulatory Visit

## 2024-03-29 ENCOUNTER — Telehealth: Payer: Self-pay

## 2024-03-29 ENCOUNTER — Ambulatory Visit

## 2024-03-29 NOTE — Telephone Encounter (Signed)
 Patient called states he misplaced information he was given about Mohs surgery. Alan mailed patient a copy of Mohs Pamphlet today. Called patient back and informed him of this information.

## 2024-04-02 ENCOUNTER — Ambulatory Visit

## 2024-04-03 ENCOUNTER — Ambulatory Visit: Attending: Neurology

## 2024-04-04 ENCOUNTER — Ambulatory Visit

## 2024-04-05 ENCOUNTER — Ambulatory Visit

## 2024-04-09 ENCOUNTER — Ambulatory Visit

## 2024-04-10 ENCOUNTER — Ambulatory Visit

## 2024-04-11 ENCOUNTER — Ambulatory Visit

## 2024-04-12 ENCOUNTER — Ambulatory Visit

## 2024-04-16 ENCOUNTER — Ambulatory Visit

## 2024-04-17 ENCOUNTER — Ambulatory Visit

## 2024-04-18 ENCOUNTER — Ambulatory Visit

## 2024-04-19 ENCOUNTER — Ambulatory Visit

## 2024-04-23 ENCOUNTER — Ambulatory Visit

## 2024-04-24 ENCOUNTER — Encounter: Admitting: Dermatology

## 2024-04-24 ENCOUNTER — Ambulatory Visit

## 2024-04-25 ENCOUNTER — Ambulatory Visit

## 2024-04-26 ENCOUNTER — Ambulatory Visit

## 2024-04-30 ENCOUNTER — Ambulatory Visit

## 2024-05-01 ENCOUNTER — Ambulatory Visit: Attending: Neurology

## 2024-05-02 ENCOUNTER — Ambulatory Visit

## 2024-05-03 ENCOUNTER — Ambulatory Visit

## 2024-05-07 ENCOUNTER — Ambulatory Visit

## 2024-05-08 ENCOUNTER — Ambulatory Visit

## 2024-05-09 ENCOUNTER — Ambulatory Visit

## 2024-05-10 ENCOUNTER — Ambulatory Visit

## 2024-05-14 ENCOUNTER — Ambulatory Visit

## 2024-05-15 ENCOUNTER — Ambulatory Visit

## 2024-05-16 ENCOUNTER — Ambulatory Visit

## 2024-05-17 ENCOUNTER — Ambulatory Visit

## 2024-05-22 ENCOUNTER — Ambulatory Visit

## 2024-05-24 ENCOUNTER — Ambulatory Visit

## 2024-08-02 ENCOUNTER — Ambulatory Visit: Admitting: Dermatology
